# Patient Record
Sex: Male | Born: 1945 | Race: White | Hispanic: No | Marital: Married | Smoking: Former smoker
Health system: Southern US, Community
[De-identification: ages and names within clinical notes are randomized; demographics above are authoritative.]

## PROBLEM LIST (undated history)

## (undated) ENCOUNTER — Ambulatory Visit: Admission: EM | Payer: Medicare Other

## (undated) DIAGNOSIS — Z8589 Personal history of malignant neoplasm of other organs and systems: Secondary | ICD-10-CM

## (undated) DIAGNOSIS — R112 Nausea with vomiting, unspecified: Secondary | ICD-10-CM

## (undated) DIAGNOSIS — F419 Anxiety disorder, unspecified: Secondary | ICD-10-CM

## (undated) DIAGNOSIS — G4733 Obstructive sleep apnea (adult) (pediatric): Secondary | ICD-10-CM

## (undated) DIAGNOSIS — E785 Hyperlipidemia, unspecified: Secondary | ICD-10-CM

## (undated) DIAGNOSIS — I10 Essential (primary) hypertension: Secondary | ICD-10-CM

## (undated) DIAGNOSIS — I5032 Chronic diastolic (congestive) heart failure: Secondary | ICD-10-CM

## (undated) DIAGNOSIS — Z9889 Other specified postprocedural states: Secondary | ICD-10-CM

## (undated) DIAGNOSIS — Z9981 Dependence on supplemental oxygen: Secondary | ICD-10-CM

## (undated) DIAGNOSIS — M1990 Unspecified osteoarthritis, unspecified site: Secondary | ICD-10-CM

## (undated) DIAGNOSIS — A4902 Methicillin resistant Staphylococcus aureus infection, unspecified site: Secondary | ICD-10-CM

## (undated) DIAGNOSIS — I499 Cardiac arrhythmia, unspecified: Secondary | ICD-10-CM

## (undated) DIAGNOSIS — T8859XA Other complications of anesthesia, initial encounter: Secondary | ICD-10-CM

## (undated) DIAGNOSIS — A809 Acute poliomyelitis, unspecified: Secondary | ICD-10-CM

## (undated) DIAGNOSIS — N189 Chronic kidney disease, unspecified: Secondary | ICD-10-CM

## (undated) DIAGNOSIS — I5189 Other ill-defined heart diseases: Secondary | ICD-10-CM

## (undated) DIAGNOSIS — K08109 Complete loss of teeth, unspecified cause, unspecified class: Secondary | ICD-10-CM

## (undated) DIAGNOSIS — I4892 Unspecified atrial flutter: Secondary | ICD-10-CM

## (undated) DIAGNOSIS — F32A Depression, unspecified: Secondary | ICD-10-CM

## (undated) DIAGNOSIS — Z972 Presence of dental prosthetic device (complete) (partial): Secondary | ICD-10-CM

## (undated) DIAGNOSIS — D649 Anemia, unspecified: Secondary | ICD-10-CM

## (undated) DIAGNOSIS — T4145XA Adverse effect of unspecified anesthetic, initial encounter: Secondary | ICD-10-CM

## (undated) DIAGNOSIS — E119 Type 2 diabetes mellitus without complications: Secondary | ICD-10-CM

## (undated) DIAGNOSIS — Z8489 Family history of other specified conditions: Secondary | ICD-10-CM

## (undated) DIAGNOSIS — I509 Heart failure, unspecified: Secondary | ICD-10-CM

## (undated) DIAGNOSIS — H9190 Unspecified hearing loss, unspecified ear: Secondary | ICD-10-CM

## (undated) DIAGNOSIS — C84A0 Cutaneous T-cell lymphoma, unspecified, unspecified site: Secondary | ICD-10-CM

## (undated) DIAGNOSIS — N184 Chronic kidney disease, stage 4 (severe): Secondary | ICD-10-CM

## (undated) DIAGNOSIS — Z86018 Personal history of other benign neoplasm: Secondary | ICD-10-CM

## (undated) DIAGNOSIS — D631 Anemia in chronic kidney disease: Secondary | ICD-10-CM

## (undated) HISTORY — DX: Unspecified atrial flutter: I48.92

## (undated) HISTORY — PX: INCISION AND DRAINAGE: SHX5863

## (undated) HISTORY — DX: Other ill-defined heart diseases: I51.89

## (undated) HISTORY — PX: OTHER SURGICAL HISTORY: SHX169

## (undated) HISTORY — DX: Hyperlipidemia, unspecified: E78.5

## (undated) HISTORY — DX: Methicillin resistant Staphylococcus aureus infection, unspecified site: A49.02

## (undated) HISTORY — DX: Acute poliomyelitis, unspecified: A80.9

## (undated) HISTORY — DX: Obstructive sleep apnea (adult) (pediatric): G47.33

## (undated) HISTORY — PX: BACK SURGERY: SHX140

## (undated) HISTORY — DX: Type 2 diabetes mellitus without complications: E11.9

## (undated) HISTORY — PX: TONSILLECTOMY: SUR1361

## (undated) HISTORY — DX: Unspecified osteoarthritis, unspecified site: M19.90

## (undated) HISTORY — DX: Essential (primary) hypertension: I10

## (undated) HISTORY — PX: MOUTH SURGERY: SHX715

## (undated) HISTORY — DX: Cutaneous T-cell lymphoma, unspecified, unspecified site: C84.A0

## (undated) HISTORY — DX: Chronic kidney disease, stage 4 (severe): N18.4

## (undated) HISTORY — DX: Chronic diastolic (congestive) heart failure: I50.32

---
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
## 2001-01-27 ENCOUNTER — Encounter: Payer: Self-pay | Admitting: Orthopaedic Surgery

## 2001-01-27 ENCOUNTER — Ambulatory Visit (HOSPITAL_COMMUNITY): Admission: RE | Admit: 2001-01-27 | Discharge: 2001-01-27 | Payer: Self-pay | Admitting: Orthopaedic Surgery

## 2006-06-09 ENCOUNTER — Emergency Department (HOSPITAL_COMMUNITY): Admission: EM | Admit: 2006-06-09 | Discharge: 2006-06-10 | Payer: Self-pay | Admitting: Emergency Medicine

## 2008-09-10 ENCOUNTER — Ambulatory Visit: Payer: Self-pay | Admitting: Internal Medicine

## 2008-09-30 ENCOUNTER — Encounter: Payer: Self-pay | Admitting: Internal Medicine

## 2008-09-30 ENCOUNTER — Ambulatory Visit: Payer: Self-pay | Admitting: Internal Medicine

## 2008-10-01 ENCOUNTER — Encounter: Payer: Self-pay | Admitting: Internal Medicine

## 2009-12-28 ENCOUNTER — Ambulatory Visit: Payer: Self-pay

## 2010-01-12 ENCOUNTER — Ambulatory Visit: Payer: Self-pay | Admitting: Cardiovascular Disease

## 2010-01-12 DIAGNOSIS — I10 Essential (primary) hypertension: Secondary | ICD-10-CM | POA: Insufficient documentation

## 2010-01-12 DIAGNOSIS — A809 Acute poliomyelitis, unspecified: Secondary | ICD-10-CM

## 2010-01-12 HISTORY — DX: Acute poliomyelitis, unspecified: A80.9

## 2010-01-16 ENCOUNTER — Ambulatory Visit: Payer: Self-pay | Admitting: Cardiovascular Disease

## 2010-02-02 ENCOUNTER — Encounter: Admission: RE | Admit: 2010-02-02 | Discharge: 2010-03-26 | Payer: Self-pay | Admitting: Neurology

## 2010-09-08 NOTE — Assessment & Plan Note (Signed)
Summary: NP6/AMD   Visit Type:  new pt visit Primary Provider:  Golden Pop  CC:  no complaints.  History of Present Illness: 65 year old gentleman no known cardiac disease, who exercises on a frequent basis who presents for evaluation prior to biking across the country. He denies any significant shortness of breath or chest discomfort. He reports that his blood pressure has been elevated moderately recently with systolics sometimes above 140s in to the 150 range. He runs and bikes frequently, hikes as well. He is a nonsmoker.  Prior to doing any significant exertion such as biking across country, he like to have a stress test.  Allergies (verified): No Known Drug Allergies  Past History:  Past Surgical History: Surgical History: Right shoulder surgery /right elbow surgery/Right knee surgery spleenectomy  Family History: Family History of Cancer: Father pancreatic  Social History: Tobacco Use - No.  Alcohol Use - no Drug Use - no Retired --Actor Married   Review of Systems  The patient denies fever, weight loss, weight gain, vision loss, decreased hearing, hoarseness, chest pain, syncope, dyspnea on exertion, peripheral edema, prolonged cough, abdominal pain, incontinence, muscle weakness, depression, and enlarged lymph nodes.    Vital Signs:  Patient profile:   65 year old male Height:      71 inches Weight:      217 pounds BMI:     30.37 Pulse rate:   93 / minute BP sitting:   157 / 91  (left arm) Cuff size:   regular  Vitals Entered By: Dolores Lory, CMA (January 12, 2010 10:37 AM)  Physical Exam  General:  Well developed, well nourished, in no acute distress. Head:  normocephalic and atraumatic Neck:  Neck supple, no JVD. No masses, thyromegaly or abnormal cervical nodes. Chest Wall:  no deformities or breast masses noted Lungs:  Clear bilaterally to auscultation and percussion. Heart:  Non-displaced PMI, chest non-tender; regular rate  and rhythm, S1, S2 without murmurs, rubs or gallops. Carotid upstroke normal, no bruit.  Pedals normal pulses. No edema, no varicosities. Abdomen:  Bowel sounds positive; abdomen soft and non-tender without masses, organomegaly, or hernias noted. No hepatosplenomegaly. Msk:  Back normal, normal gait. Muscle strength and tone normal. Pulses:  pulses normal in all 4 extremities Extremities:  No clubbing or cyanosis. Neurologic:  Alert and oriented x 3. Skin:  Intact without lesions or rashes. Psych:  Normal affect.   New Orders:     1)  Treadmill (Treadmill)   EKG  Procedure date:  01/12/2010  Findings:      normal sinus rhythm with rate 93 beats per minute, no significant ST or T wave changes.  Impression & Recommendations:  Problem # 1:  HYPERTENSION (ICD-401.9) blood pressure is borderline elevated today. We have asked him to monitor his pressures at home as he has a new blood pressure cuff.  His updated medication list for this problem includes:    Aspirin 81 Mg Tbec (Aspirin) .Marland Kitchen... Take one tablet by mouth daily    Tekturna 150 Mg Tabs (Aliskiren fumarate) .Marland Kitchen... Take one tablet by mouth daily    Azor 10-40 Mg Tabs (Amlodipine-olmesartan) ..... Once daily  Orders: Treadmill (Treadmill)  Problem # 2:  PREVENTIVE HEALTH CARE (ICD-V70.0) He reports a cholesterol of 181. He is a nonsmoker, no diabetes, some family history of coronary artery disease. He would like to treat this medically.  His heart rate is mildly elevated and have asked him to monitor his heart rate at  baseline and with exercise.  he would like a treadmill stress test to ensure that he be able to handle heavy exertion such as biking across country. Given his history of hypertension, baseline elevated heart rate, we will order a plain treadmill test at [facility].  Patient Instructions: 1)  Your physician recommends that you continue on your current medications as directed. Please refer to  the Current Medication list given to you today. 2)  Your physician has requested that you have an exercise tolerance test.  For further information please visit [email or web address].  Please also follow instruction sheet, as given.

## 2010-09-08 NOTE — Letter (Signed)
Summary: health hx  health hx   Imported By: Cathie Beams Chriscoe 01/12/2010 16:02:29  _____________________________________________________________________  External Attachment:    Type:   Image     Comment:   External Document

## 2010-09-08 NOTE — Letter (Signed)
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 [place], [place] [address]   Phone: [phone]  Fax: [phone]      [facility] ETT  Patient Name:  Daniel Kline          MRN:  [id number]  DOB:  [birth year]             Weight:  217 Home Telephone:  [phone]           Work Phone:  [phone] Referring Physician: Sima Matas at 7:30 am on Friday January 16, 2010   Instructions regarding medication: OK to take all medications with a sip of water    PLEASE NOTIFY THE OFFICE AT LEAST 24 HOURS IN ADVANCE IF YOU ARE UNABLE TO [place].  [phone]   How to prepare for your Stress test:  1)   May eat a lite breakfast.  2)   No caffeine or decaffeinated 12 hours prior to arrival. 3)   Your medication may be taken with water.  If your doctor stopped a        medicaiton because of this test, do not take that medication. 4)   Ladies, please do not wear dresses.  Skirts or pants are appropriate.       Please wear a short sleeve shirt. 5)   No perfume, cologne or lotion. 6)   Wear comfortable walking shoes.  No heels!    PLEASE REPORT TO [facility] MEDICAL MALL ENTRANCE. THE VOLUNTEERS AT THE FIRST DESK WILL DIRECT YOU WHERE TO GO.

## 2011-02-15 ENCOUNTER — Ambulatory Visit: Payer: Self-pay | Admitting: Unknown Physician Specialty

## 2011-02-23 ENCOUNTER — Inpatient Hospital Stay: Payer: Self-pay | Admitting: Unknown Physician Specialty

## 2011-02-23 ENCOUNTER — Encounter: Payer: Self-pay | Admitting: Cardiovascular Disease

## 2011-03-24 ENCOUNTER — Other Ambulatory Visit: Payer: Self-pay | Admitting: Unknown Physician Specialty

## 2011-04-09 ENCOUNTER — Inpatient Hospital Stay: Payer: Self-pay | Admitting: Unknown Physician Specialty

## 2011-05-17 ENCOUNTER — Other Ambulatory Visit: Payer: Self-pay | Admitting: Internal Medicine

## 2011-06-13 ENCOUNTER — Inpatient Hospital Stay: Payer: Self-pay | Admitting: Internal Medicine

## 2011-09-21 IMAGING — CR DG CHEST 2V
1 series · 2 of 2 positions shown · non-contrast
Comparison: none

REASON FOR EXAM: cough, fever
COMMENTS:

[Series 1: view not recorded · 0.17mm/px · 2 of 2 slices shown]
[im 1/2]
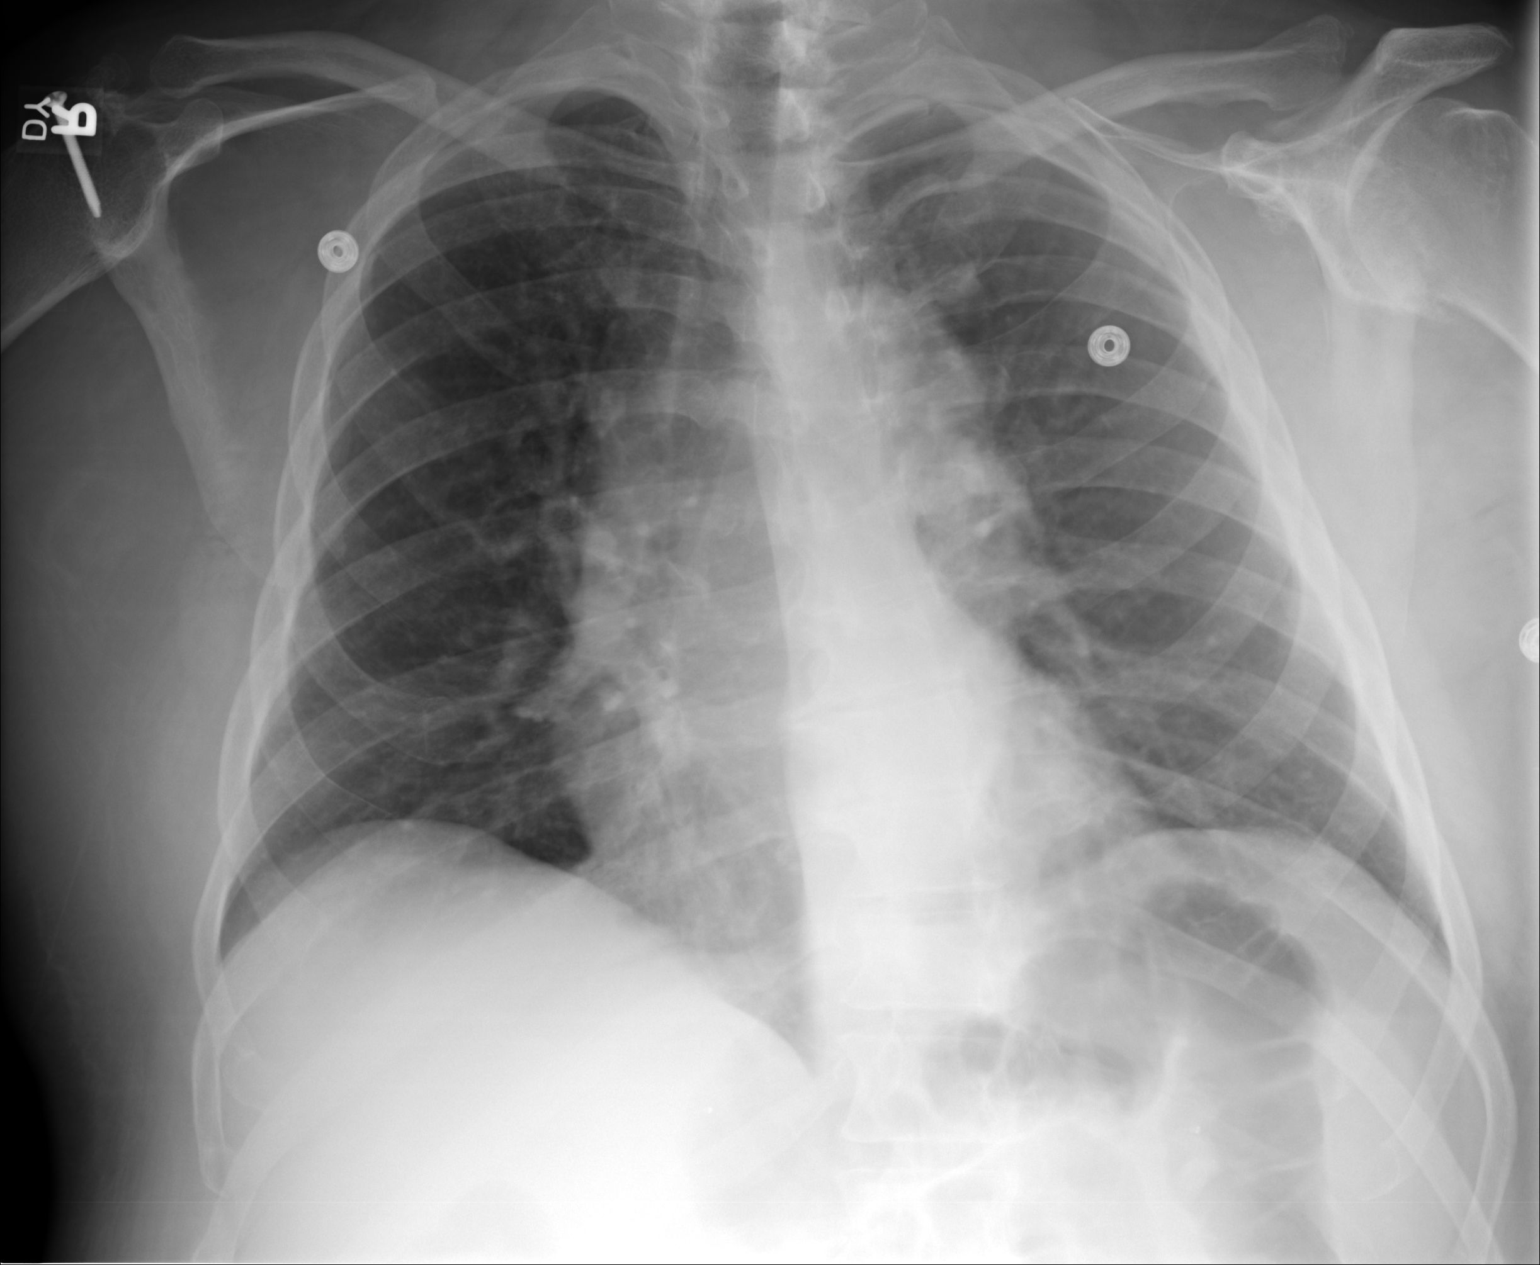
[im 2/2]
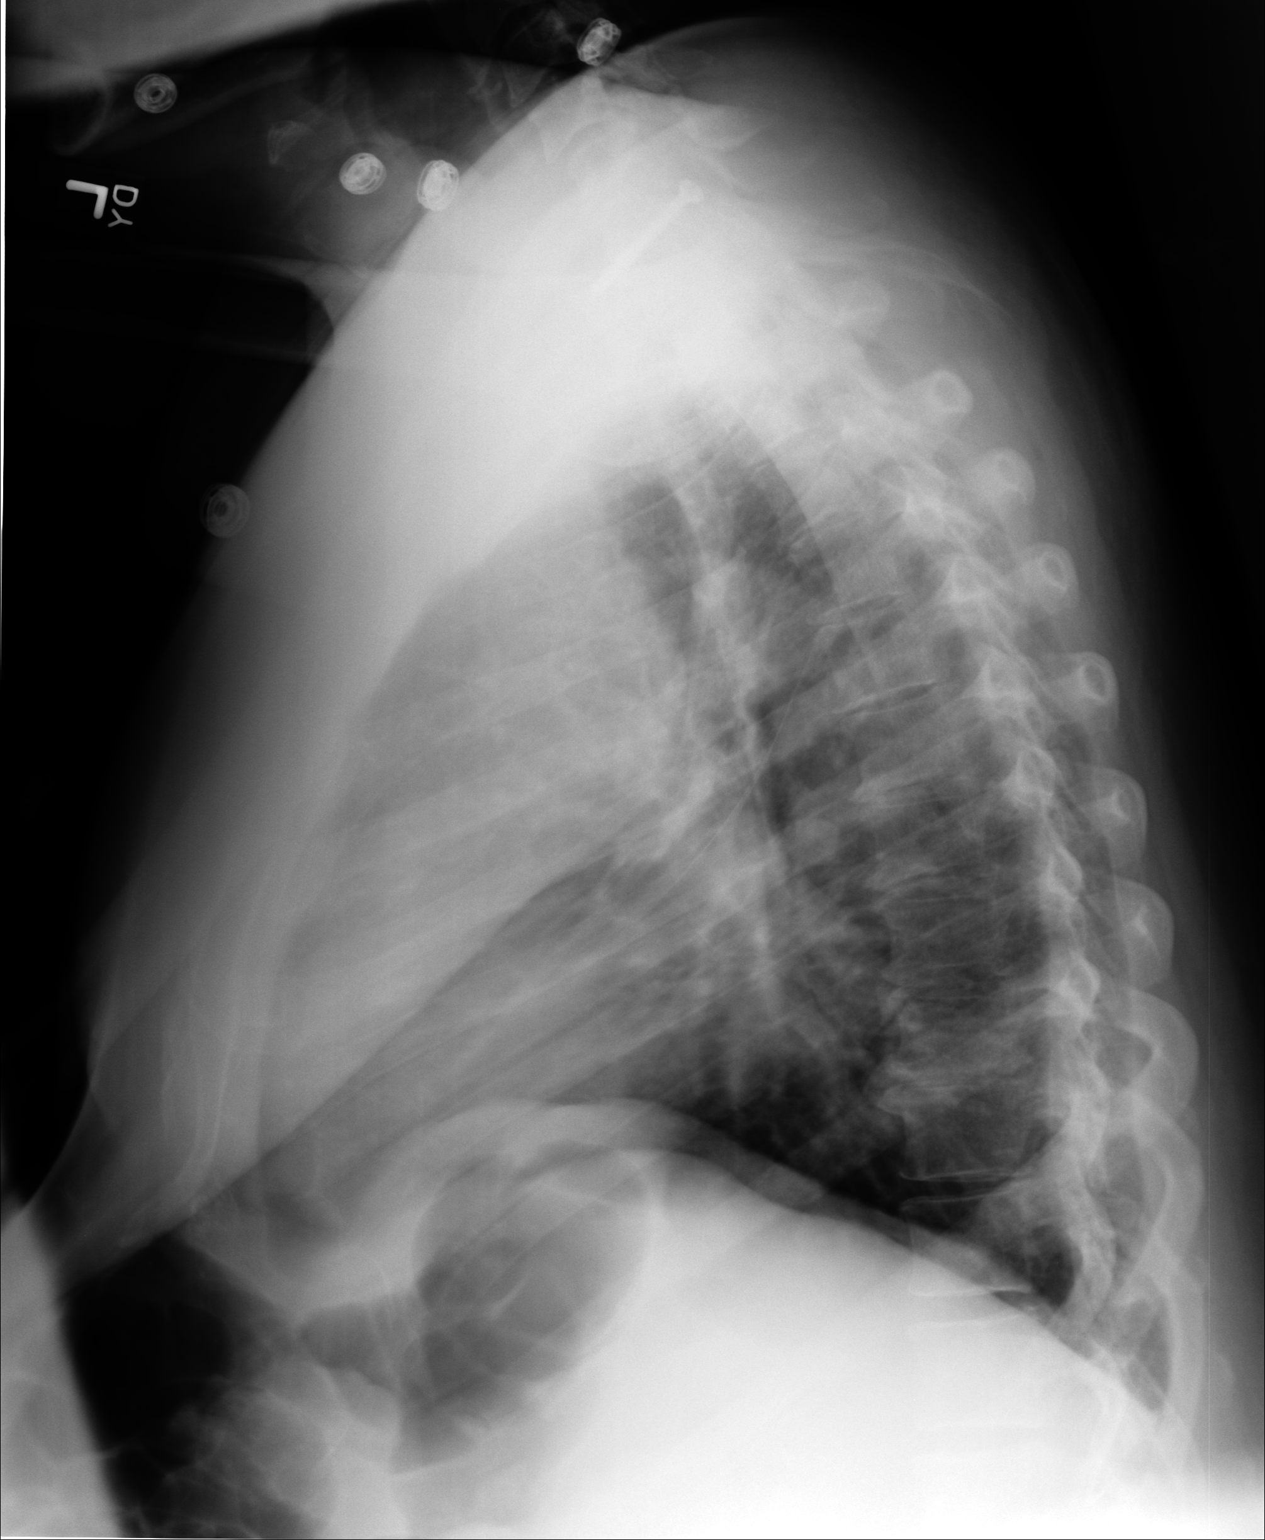

[2 of 2 positions shown; findings below may reference images not displayed]

PROCEDURE:     DXR - DXR CHEST PA (OR AP) AND LATERAL  - April 10, 2011  [DATE]

RESULT:     Comparison is made to the previous exam of 04/09/2011.

The patient is rotated slightly toward the left. The heart is within normal
limits for size. Prominent costochondral calcification is present.
Degenerative bony spurring is present. The patient could not raise one arm
well for the lateral exam. There is no edema, effusion or infiltrate.
IMPRESSION: No acute cardiopulmonary disease evident.

## 2011-09-23 IMAGING — CR DG ABDOMEN 1V
1 series · 2 of 2 positions shown · non-contrast
Comparison: none

REASON FOR EXAM: vomitting and abdominal distention
COMMENTS:

PROCEDURE:     DXR - DXR KIDNEY URETER BLADDER  - April 12, 2011  [DATE]
RESULT:     Comparisons:  04/09/2011

[Series 1: view not recorded · 0.17mm/px · 2 of 2 slices shown]
[im 1/2]
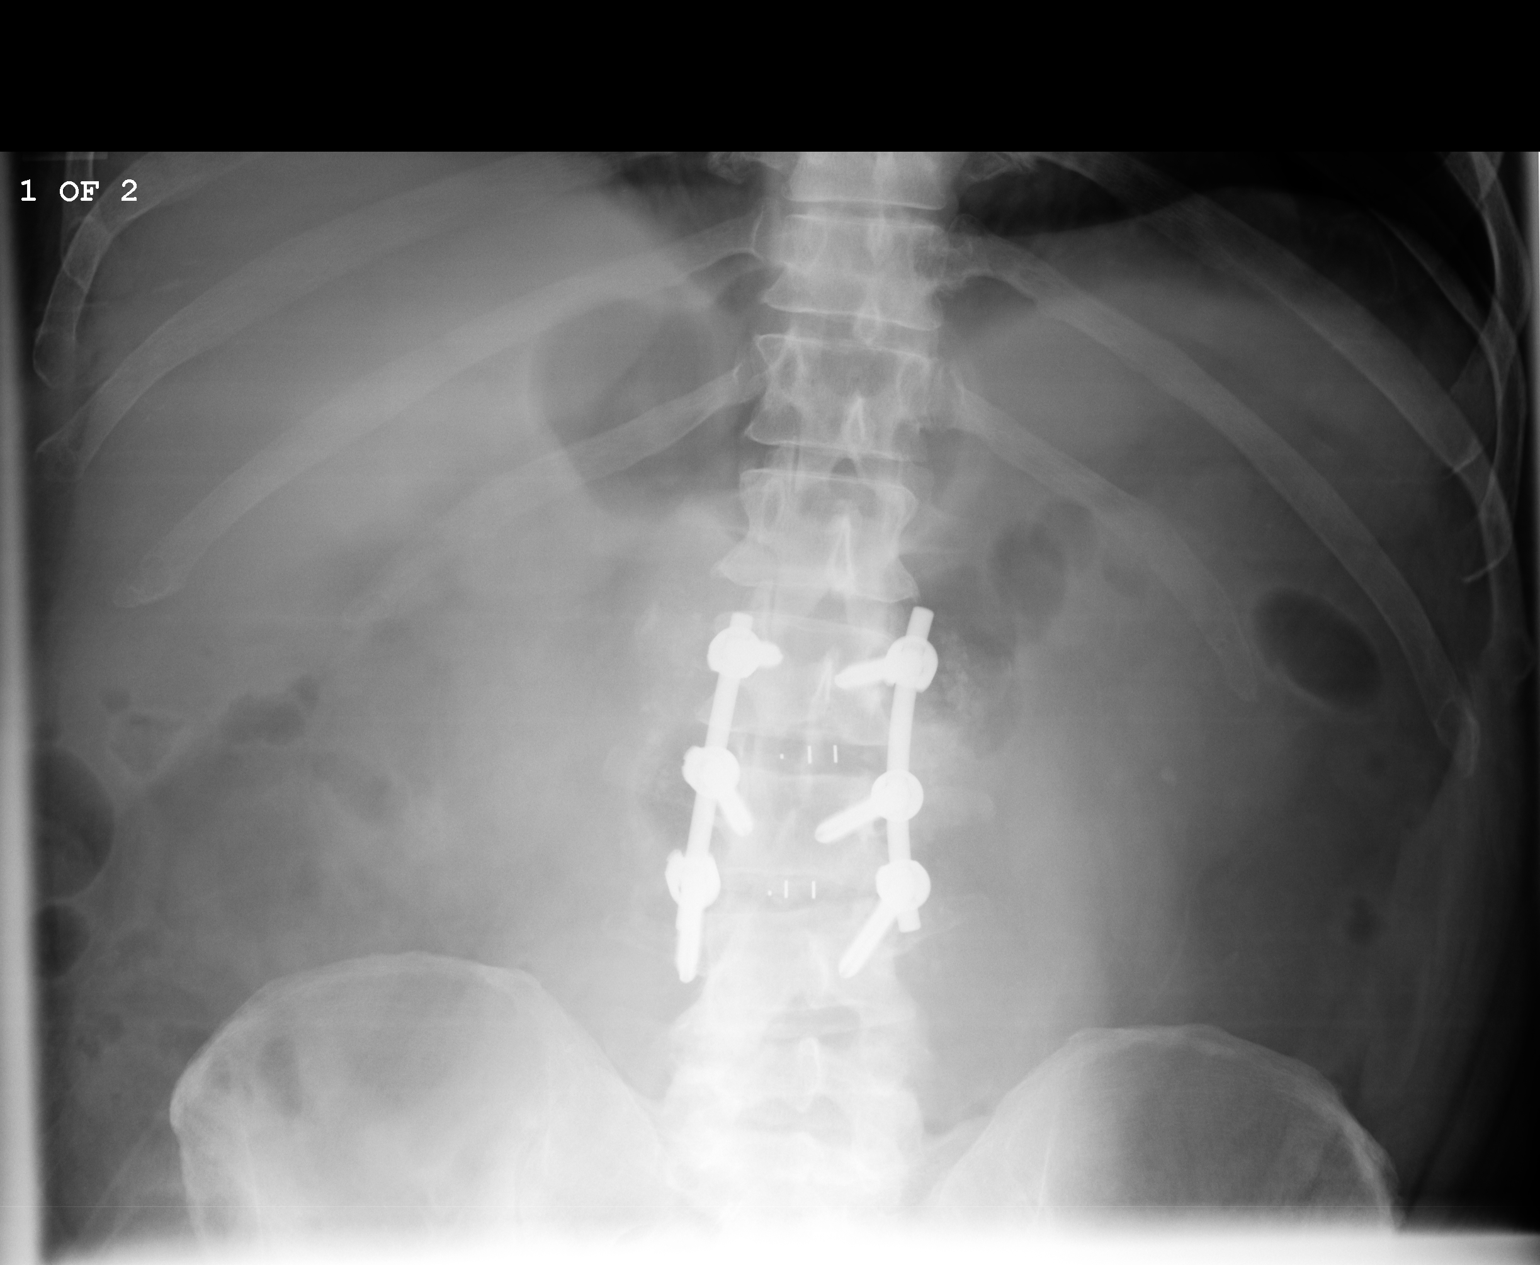
[im 2/2]
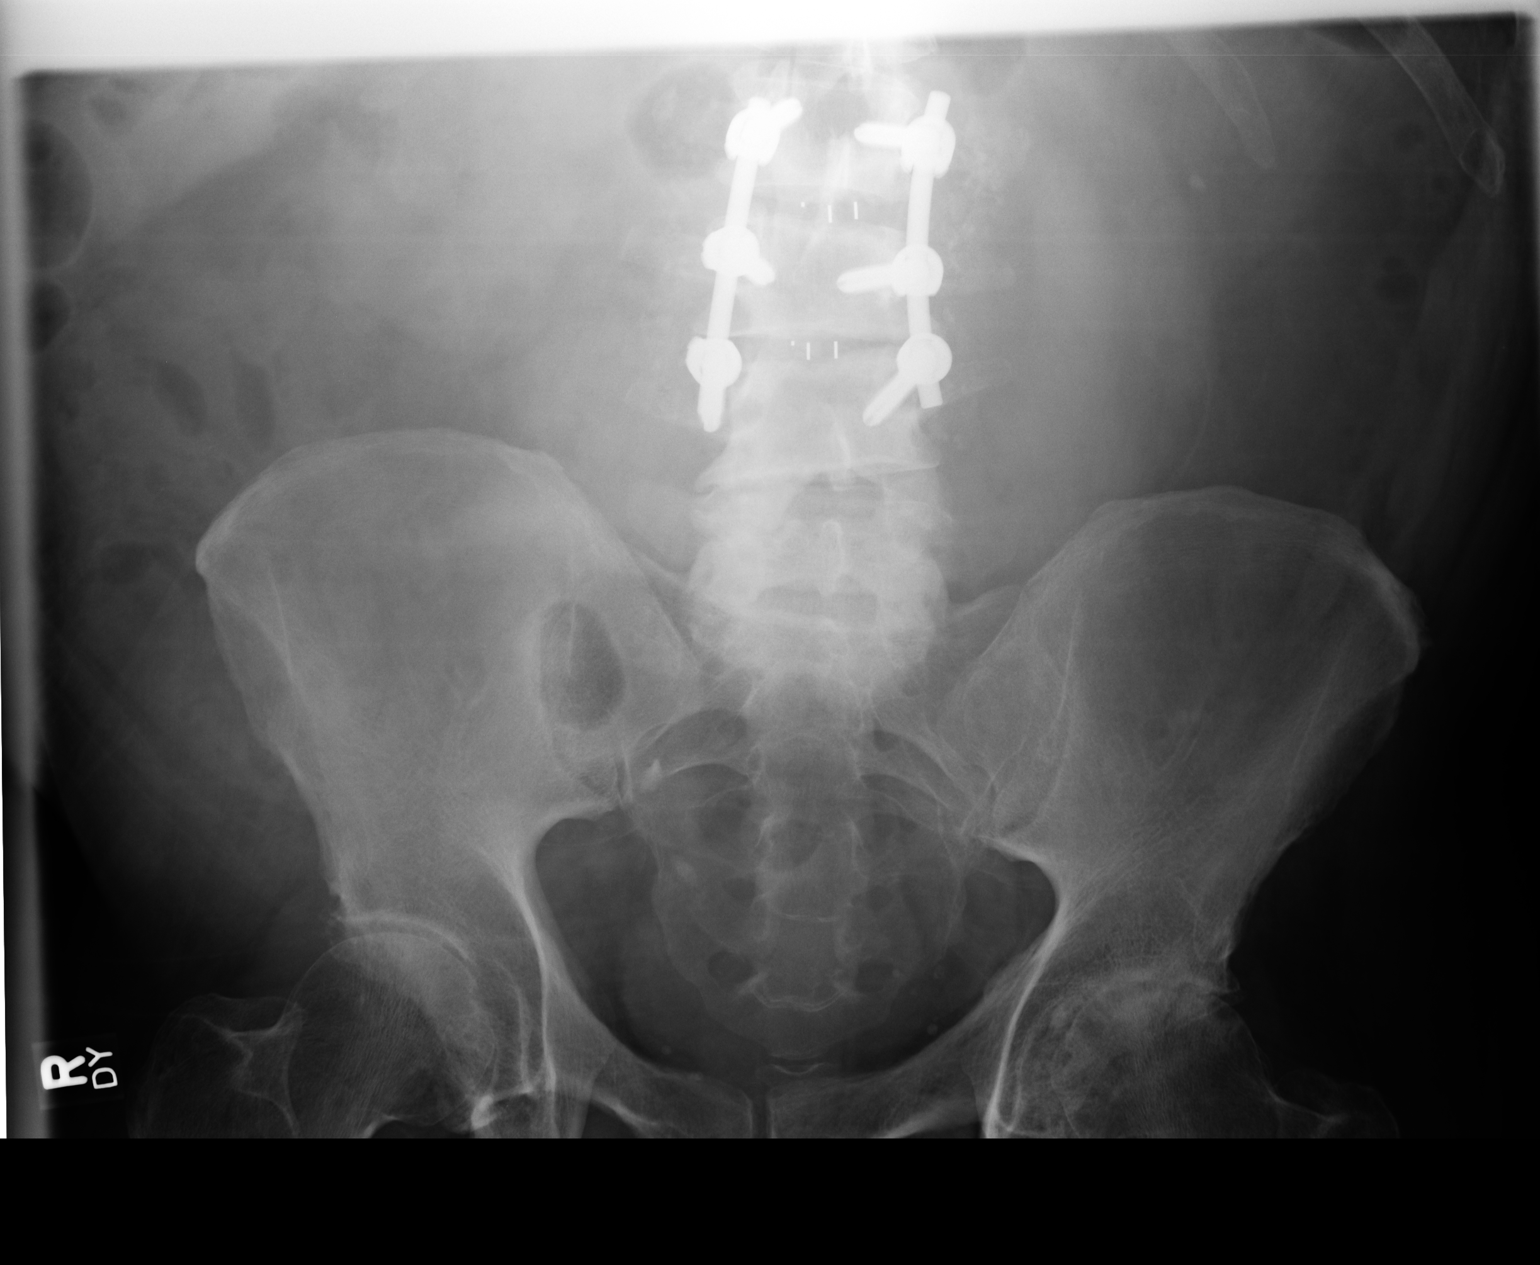

[2 of 2 positions shown; findings below may reference images not displayed]

FINDINGS: Supine radiograph of the abdomen is provided.

There is a nonspecific bowel gas pattern. There is no bowel dilatation to
suggest obstruction. There is no pathologic calcification along the expected
course of the ureters. There is no evidence of pneumoperitoneum, portal
venous gas, or pneumatosis.

There is posterior spinal fusion from L2 through L4. The right L4 pedicle
screw a appears slightly more lateral to the pedicle compared with the other
screws which may be artifactual.
IMPRESSION: Unremarkable KUB.

 The right L4 pedicle screw a appears slightly more lateral to the pedicle
compared with the other screws which may be artifactual. If there is further
clinical concern recommend a CT of the lumbar spine for further evaluation.

## 2011-10-18 ENCOUNTER — Ambulatory Visit: Payer: Self-pay | Admitting: Unknown Physician Specialty

## 2011-10-18 LAB — URINALYSIS, COMPLETE
Bacteria: NONE SEEN
Blood: NEGATIVE
Glucose,UR: NEGATIVE mg/dL (ref 0–75)
Nitrite: NEGATIVE
Ph: 5 (ref 4.5–8.0)
Protein: NEGATIVE
Specific Gravity: 1.021 (ref 1.003–1.030)

## 2011-10-18 LAB — BASIC METABOLIC PANEL
Anion Gap: 6 — ABNORMAL LOW (ref 7–16)
BUN: 27 mg/dL — ABNORMAL HIGH (ref 7–18)
Chloride: 105 mmol/L (ref 98–107)
Creatinine: 1.59 mg/dL — ABNORMAL HIGH (ref 0.60–1.30)
EGFR (Non-African Amer.): 46 — ABNORMAL LOW

## 2011-10-18 LAB — CBC
MCH: 28.9 pg (ref 26.0–34.0)
MCHC: 33.1 g/dL (ref 32.0–36.0)
MCV: 87 fL (ref 80–100)
Platelet: 221 10*3/uL (ref 150–440)

## 2011-10-18 LAB — MRSA PCR SCREENING

## 2011-10-19 ENCOUNTER — Ambulatory Visit: Payer: Self-pay | Admitting: Anesthesiology

## 2011-10-26 ENCOUNTER — Inpatient Hospital Stay: Payer: Self-pay | Admitting: Unknown Physician Specialty

## 2011-10-27 LAB — PLATELET COUNT: Platelet: 157 10*3/uL (ref 150–440)

## 2011-10-27 LAB — BASIC METABOLIC PANEL
BUN: 22 mg/dL — ABNORMAL HIGH (ref 7–18)
Chloride: 103 mmol/L (ref 98–107)
Co2: 23 mmol/L (ref 21–32)
EGFR (Non-African Amer.): 48 — ABNORMAL LOW
Glucose: 142 mg/dL — ABNORMAL HIGH (ref 65–99)
Osmolality: 281 (ref 275–301)
Potassium: 4.4 mmol/L (ref 3.5–5.1)

## 2011-10-27 LAB — HEMOGLOBIN: HGB: 8.3 g/dL — ABNORMAL LOW (ref 13.0–18.0)

## 2011-10-27 LAB — PATHOLOGY REPORT

## 2011-10-28 LAB — BASIC METABOLIC PANEL
Anion Gap: 10 (ref 7–16)
Calcium, Total: 7.9 mg/dL — ABNORMAL LOW (ref 8.5–10.1)
Co2: 25 mmol/L (ref 21–32)
EGFR (Non-African Amer.): 49 — ABNORMAL LOW
Potassium: 3.9 mmol/L (ref 3.5–5.1)
Sodium: 141 mmol/L (ref 136–145)

## 2011-10-29 LAB — HEMOGLOBIN
HGB: 7.8 g/dL — ABNORMAL LOW (ref 13.0–18.0)
HGB: 7.9 g/dL — ABNORMAL LOW (ref 13.0–18.0)

## 2011-10-30 LAB — HEMOGLOBIN: HGB: 8.5 g/dL — ABNORMAL LOW (ref 13.0–18.0)

## 2011-12-20 DIAGNOSIS — T8579XA Infection and inflammatory reaction due to other internal prosthetic devices, implants and grafts, initial encounter: Secondary | ICD-10-CM | POA: Insufficient documentation

## 2011-12-20 DIAGNOSIS — A4902 Methicillin resistant Staphylococcus aureus infection, unspecified site: Secondary | ICD-10-CM | POA: Insufficient documentation

## 2011-12-20 DIAGNOSIS — C84A0 Cutaneous T-cell lymphoma, unspecified, unspecified site: Secondary | ICD-10-CM | POA: Insufficient documentation

## 2011-12-20 DIAGNOSIS — E7800 Pure hypercholesterolemia, unspecified: Secondary | ICD-10-CM | POA: Insufficient documentation

## 2013-02-08 ENCOUNTER — Ambulatory Visit: Payer: Self-pay

## 2013-03-09 ENCOUNTER — Ambulatory Visit: Payer: Self-pay

## 2013-04-09 ENCOUNTER — Ambulatory Visit: Payer: Self-pay

## 2014-12-01 NOTE — Op Note (Signed)
PATIENT NAME:  Daniel Kline, Daniel Kline MR#:  [id number] DATE OF BIRTH:  26-Nov-1945  DATE OF PROCEDURE:  10/26/2011  PREOPERATIVE DIAGNOSIS: Degenerative arthritis, left hip.   POSTOPERATIVE DIAGNOSIS: Degenerative arthritis, left hip.   PROCEDURE: Left total hip replacement.   SURGEON: Alysia Penna. Mauri Pole, MD   ASSISTANT: Rachelle Hora, PA-C   ANESTHESIA: General.   ESTIMATED BLOOD LOSS: 800 mL.  REPLACEMENT: 1500 mL of crystalloid.   DRAINS: One Autovac.   COMPLICATIONS: None.   IMPLANTS USED: Stryker Secur-Fit Max 132 degrees, #8 femoral component, 36 mm +5 Biolox head, 56 mm acetabular shell, 10-degree liner, one 6.5 mm screw.   BRIEF CLINICAL NOTE AND PATHOLOGY: The patient had progressive documented severe degenerative arthritis of the left hip. There was some shortening by about 1 cm. The options, risks, and benefits were discussed. At time of the procedure, there was severe degenerative arthritis. The patient's bone was fairly soft. Approximately 1 cm was gained in length according to intraoperative measurements.   DESCRIPTION OF PROCEDURE: Preop antibiotics, adequate general anesthesia, right lateral view position, hip grips were used. Routine prepping and draping. Appropriate timeout was called.   The routine posterior approach was made. The fascia was opened in line with the incision, sciatic nerve identified and a self-retaining retractor was placed. The piriformis was tagged and reflected. Two Steinmann pins were placed in the pelvis paralleling in line with the anterior/superior iliac spine and sciatic notch. A drill bit was placed in the greater trochanter. Leg length was measured. Posterior capsule was then removed, and the hip was dislocated.   The proximal femur was delivered. The neck cut was made using the guide. The canal finder was used followed by the [place]. Tapered reaming then progressed up to size 8. This was done with power. Broaching was then performed, calcar  planing performed. The prosthesis filled the canal well.   Attention was then turned to the acetabulum. It was exposed, surrounding soft tissue was removed. Reaming was progressed up to 56 mm using the patient's anatomy and previously drawn line. This produced good bleeding bone, was thoroughly irrigated. The trial fit nicely. The actual acetabular shell was then placed, and it was extremely secure. One superior screw was placed for additional support.   The trial liner was placed. The hip was reduced and was extremely stable. The actual 10-degree liner was then placed.   Attention was turned back to the femoral canal where the broach was removed. The canal was thoroughly irrigated. The actual component was then inserted. It seated nicely. The +5 head trial was placed, the hip reduced, and it was extremely stable. The actual head was then placed. The hip was reduced, was stable in extension and external rotation, flexion, adduction, internal rotation to about 45 degrees.   Leg length was measured to have been increased 1 cm. The incision was thoroughly irrigated. Piriformis was reattached with #5 nonabsorbable suture. The sciatic nerve was identified and was without any obvious compression. The fascia was closed with #2 quill, the subcutaneous was closed with zero quill, the skin with staples. A Provena wound dressing was applied due to the patient's skin issues. The Autovac drain had been placed. Sponge and needle counts were reported as correct prior to and after wound closure. The patient was awakened and taken to the PAC-U having tolerated the procedure well.  ____________________________ Alysia Penna. Mauri Pole, MD jcc:cbb D: 10/27/2011 06:33:48 ET T: 10/27/2011 11:28:17 ET JOB#: [id number] Alysia Penna Dayle Mcnerney MD ELECTRONICALLY SIGNED 10/29/2011 6:07

## 2014-12-01 NOTE — Discharge Summary (Signed)
PATIENT NAME:  Daniel Kline, Daniel Kline MR#:  [id number] DATE OF BIRTH:  09/24/1945  DATE OF ADMISSION:  10/26/2011 DATE OF DISCHARGE:  10/30/2011  ADMISSION DIAGNOSIS: Degenerative arthritis, left hip.  DISCHARGE DIAGNOSIS: Degenerative arthritis, left hip.   PROCEDURE: Left hip total replacement. Surgeon: Alysia Penna. Mauri Pole, MD.  Assistant: Rachelle Hora, PA-C.  Anesthesia: General. Estimated blood loss 800 mL. Replacement: 1500 mL of crystalloid. Drains: One Autovac.  Complications: None. Implants used: Stryker Secur-Fit Max 132 degrees #8 femoral component, 36 mm +5 Biolox head, 56 mm acetabular shell, 10-degree liner, one 6.5 mm screw.   BRIEF CLINICAL NOTE AND PATHOLOGY: The patient had progressive documented severe degenerative arthritis of the left hip.  There was some shortening by about 1 cm.  The options, risks and benefits were discussed at the time of the procedure, there was severe degenerative arthritis. The patient's body was fairly soft. Approximately 1 cm was gained in length according to intraoperative measurements.   PHYSICAL EXAMINATION: GENERAL: A well-developed, well-nourished male in no apparent distress. PSYCHIATRIC: Normal affect. GENERAL: The patient was sitting in a wheelchair, marked difficulty with standing. HEENT: Pupils were equal, round and reactive to light and accommodation. Extraocular movements were intact.  NECK: Supple, full range of motion. CARDIAC: Normal regular rate and rhythm.  CHEST: Atraumatic, clear, no rales or rhonchi, no respiratory distress. ABDOMEN: Bowel sounds were positive, no masses noted. SKIN: Unremarkable. MUSCULOSKELETAL: Cervical and thoracic spine unremarkable. Lumbosacral spine: Tender at the lumbosacral junction. Pain primarily with extension. Decreased lateral rotation and bending. Right lower extremity, right hip, knee, foot and ankle have good range of motion with no pain. Left lower extremity with marked decreased range of motion with his left hip  with significant pain with any internal and external rotation.  Knee, foot and ankle have overall good range of motion.  Neurovascular examination of both lower extremities is intact with some mild diffuse weakness.  Bilateral upper extremities with good range of motion with mild diffuse weakness.   HOSPITAL COURSE: After initial admission on 10/26/2011, the patient had surgery that same day.  The patient had good pain control afterwards and was brought to the Orthopedic floor from the PACU.  The patient initially had a hemoglobin on postoperative day one of 8.3, and then on postoperative day two his hemoglobin dropped to 7.7.  The patient was given 2 units of blood, and then on postoperative day four his hemoglobin returned to 8.5.  On 10/30/2011, the patient was stable and ready to go home.   CONDITION ON DISCHARGE: Stable.   DISPOSITION: The patient was sent to rehabilitation.   DISCHARGE INSTRUCTIONS:  1. The patient will follow up with [facility] in 7 to 10 days for dressing change and staple removal.  2. The patient will do physical therapy with weightbearing as tolerated on the left using a walker.  3. The patient's diet is a carbohydrate-controlled diet.  4. TED hose will be utilized knee-high bilaterally. 5. An abduction pillow will be used when in bed and in the chair. A regular pillow can be utilized both times.  6. Activity will be weightbearing as tolerated.  7. Dressing will be changed on a p.r.n.  basis.  DISCHARGE MEDICATIONS:  1. Tylenol 500 to 1000 mg oral every 4 hours as needed for pain or temperature greater than 100.4. 2. Celebrex 200 mg oral b.i.d. as needed for pain.  3. Docusate Senna 50/8.6 mg tablet, 1 tablet oral b.i.d. for constipation.  4. Magnesium hydroxide suspension  30 mL oral b.i.d. as needed for constipation.  5. Dulcolax suppository 10 mg rectally daily as needed for constipation. 6. Soap suds enema as needed.  7. Lovenox injection 30 mg  subcutaneous every 12 hours. 8. Aluminum mag hydroxide with simethicone 400/400, 40 mg/5 mL suspension, 30 mL orally every 6 hours as needed for indigestion and heartburn. 9. Amlodipine tablet 5 mg oral once a day.  10. Clonazepam 7.5 mg oral t.i.d. as needed for anxiety. 11. Doxycycline 100 mg oral every 12 hours.  12. Hydroxyzine tablet 25 mg oral q.i.d. as needed for itching. 13. Furosemide tablet 20 mg orally once a day. 14. Bimatoprost (Lumigan) 0.01% ophthalmic solution 1 drop both eyes at bedtime. 15. Metoprolol 50 mg orally every 12 hours. 16. Gabapentin capsule 300 mg oral t.i.d.   17. Acetaminophen/hydrocodone 325/7.5 mg tablet, 1 to 2 tablets orally every 4 to 6 hours as needed for pain. 18. Menthol Cepacol Lozenges, 1 to 2 lozenges oral every 4 hours as needed for cough or sore throat.   ____________________________ Duanne Guess, PA-C tcg:cbb D: 10/30/2011 12:51:20 ET T: 10/30/2011 12:56:36 ET JOB#: [id number]  cc: Duanne Guess, PA-C, <Dictator> Duanne Guess [place] ELECTRONICALLY SIGNED 11/01/2011 11:47

## 2015-06-24 ENCOUNTER — Encounter: Payer: Self-pay | Admitting: Internal Medicine

## 2015-08-26 DIAGNOSIS — M1611 Unilateral primary osteoarthritis, right hip: Secondary | ICD-10-CM | POA: Diagnosis not present

## 2015-08-26 DIAGNOSIS — M25551 Pain in right hip: Secondary | ICD-10-CM | POA: Diagnosis not present

## 2015-08-26 DIAGNOSIS — Z888 Allergy status to other drugs, medicaments and biological substances status: Secondary | ICD-10-CM | POA: Diagnosis not present

## 2015-08-26 DIAGNOSIS — M25461 Effusion, right knee: Secondary | ICD-10-CM | POA: Diagnosis not present

## 2015-08-26 DIAGNOSIS — M1711 Unilateral primary osteoarthritis, right knee: Secondary | ICD-10-CM | POA: Insufficient documentation

## 2015-08-26 DIAGNOSIS — M25561 Pain in right knee: Secondary | ICD-10-CM | POA: Diagnosis not present

## 2015-08-26 DIAGNOSIS — G8929 Other chronic pain: Secondary | ICD-10-CM | POA: Diagnosis not present

## 2015-08-26 DIAGNOSIS — Z7984 Long term (current) use of oral hypoglycemic drugs: Secondary | ICD-10-CM | POA: Diagnosis not present

## 2015-08-26 DIAGNOSIS — Z885 Allergy status to narcotic agent status: Secondary | ICD-10-CM | POA: Diagnosis not present

## 2015-08-26 DIAGNOSIS — Z96642 Presence of left artificial hip joint: Secondary | ICD-10-CM | POA: Diagnosis not present

## 2015-08-26 DIAGNOSIS — M169 Osteoarthritis of hip, unspecified: Secondary | ICD-10-CM | POA: Insufficient documentation

## 2015-08-26 DIAGNOSIS — E119 Type 2 diabetes mellitus without complications: Secondary | ICD-10-CM | POA: Diagnosis not present

## 2015-08-26 DIAGNOSIS — M1710 Unilateral primary osteoarthritis, unspecified knee: Secondary | ICD-10-CM | POA: Insufficient documentation

## 2015-09-01 DIAGNOSIS — M25551 Pain in right hip: Secondary | ICD-10-CM | POA: Diagnosis not present

## 2015-09-01 DIAGNOSIS — Z79899 Other long term (current) drug therapy: Secondary | ICD-10-CM | POA: Diagnosis not present

## 2015-09-01 DIAGNOSIS — M25561 Pain in right knee: Secondary | ICD-10-CM | POA: Diagnosis not present

## 2015-09-01 DIAGNOSIS — M1711 Unilateral primary osteoarthritis, right knee: Secondary | ICD-10-CM | POA: Diagnosis not present

## 2015-09-01 DIAGNOSIS — G8929 Other chronic pain: Secondary | ICD-10-CM | POA: Diagnosis not present

## 2015-09-01 DIAGNOSIS — C84A0 Cutaneous T-cell lymphoma, unspecified, unspecified site: Secondary | ICD-10-CM | POA: Diagnosis not present

## 2015-09-01 DIAGNOSIS — M1611 Unilateral primary osteoarthritis, right hip: Secondary | ICD-10-CM | POA: Diagnosis not present

## 2015-09-08 DIAGNOSIS — M1611 Unilateral primary osteoarthritis, right hip: Secondary | ICD-10-CM | POA: Diagnosis not present

## 2015-09-09 DIAGNOSIS — M25561 Pain in right knee: Secondary | ICD-10-CM | POA: Diagnosis not present

## 2015-09-09 DIAGNOSIS — M1611 Unilateral primary osteoarthritis, right hip: Secondary | ICD-10-CM | POA: Diagnosis not present

## 2015-09-09 DIAGNOSIS — M25551 Pain in right hip: Secondary | ICD-10-CM | POA: Diagnosis not present

## 2015-09-09 DIAGNOSIS — M1711 Unilateral primary osteoarthritis, right knee: Secondary | ICD-10-CM | POA: Diagnosis not present

## 2015-09-09 DIAGNOSIS — G8929 Other chronic pain: Secondary | ICD-10-CM | POA: Diagnosis not present

## 2015-09-15 DIAGNOSIS — M1711 Unilateral primary osteoarthritis, right knee: Secondary | ICD-10-CM | POA: Diagnosis not present

## 2015-09-15 DIAGNOSIS — G8929 Other chronic pain: Secondary | ICD-10-CM | POA: Diagnosis not present

## 2015-09-15 DIAGNOSIS — M25561 Pain in right knee: Secondary | ICD-10-CM | POA: Diagnosis not present

## 2015-09-15 DIAGNOSIS — M1611 Unilateral primary osteoarthritis, right hip: Secondary | ICD-10-CM | POA: Diagnosis not present

## 2015-09-15 DIAGNOSIS — M25551 Pain in right hip: Secondary | ICD-10-CM | POA: Diagnosis not present

## 2015-09-19 ENCOUNTER — Encounter: Payer: Self-pay | Admitting: Gastroenterology

## 2015-09-22 DIAGNOSIS — M25551 Pain in right hip: Secondary | ICD-10-CM | POA: Diagnosis not present

## 2015-09-22 DIAGNOSIS — M1611 Unilateral primary osteoarthritis, right hip: Secondary | ICD-10-CM | POA: Diagnosis not present

## 2015-09-22 DIAGNOSIS — G8929 Other chronic pain: Secondary | ICD-10-CM | POA: Diagnosis not present

## 2015-09-22 DIAGNOSIS — M1711 Unilateral primary osteoarthritis, right knee: Secondary | ICD-10-CM | POA: Diagnosis not present

## 2015-09-22 DIAGNOSIS — M25561 Pain in right knee: Secondary | ICD-10-CM | POA: Diagnosis not present

## 2015-09-24 ENCOUNTER — Ambulatory Visit (INDEPENDENT_AMBULATORY_CARE_PROVIDER_SITE_OTHER): Payer: PPO | Admitting: Internal Medicine

## 2015-09-24 ENCOUNTER — Encounter: Payer: Self-pay | Admitting: Internal Medicine

## 2015-09-24 VITALS — BP 168/90 | HR 71 | Temp 98.0°F | Resp 12 | Ht 66.5 in | Wt 241.5 lb

## 2015-09-24 DIAGNOSIS — E1121 Type 2 diabetes mellitus with diabetic nephropathy: Secondary | ICD-10-CM

## 2015-09-24 DIAGNOSIS — E669 Obesity, unspecified: Secondary | ICD-10-CM | POA: Diagnosis not present

## 2015-09-24 DIAGNOSIS — E119 Type 2 diabetes mellitus without complications: Secondary | ICD-10-CM | POA: Diagnosis not present

## 2015-09-24 DIAGNOSIS — Z125 Encounter for screening for malignant neoplasm of prostate: Secondary | ICD-10-CM | POA: Diagnosis not present

## 2015-09-24 DIAGNOSIS — I10 Essential (primary) hypertension: Secondary | ICD-10-CM | POA: Diagnosis not present

## 2015-09-24 MED ORDER — HYDROCODONE-ACETAMINOPHEN 5-325 MG PO TABS: 1.0000 | ORAL_TABLET | Freq: Two times a day (BID) | ORAL | Status: DC

## 2015-09-24 NOTE — Progress Notes (Signed)
Subjective:  Patient ID: Daniel Kline, male    DOB: 21-Nov-1945  Age: 70 y.o. MRN: [id number]  CC: The primary encounter diagnosis was Prostate cancer screening. Diagnoses of Diabetes mellitus without complication ([place]), Essential hypertension, Obesity, and Diabetes mellitus with nephropathy ([place]) were also pertinent to this visit.  HPI Daniel Kline presents for establishment of care.  Has not had labs in over a year..  Last meal was at 11:00 .    DM Type 2: Has been taking metformin only since diagnosis in   2014?   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 Infection occurred at [facility] in 2012 required rehab for months .  Micheal blocker managed the infection .  DJD right hip right knee .  Seeing PT.  April 19th sees ortho for right hip replacement discussion.  Likes to exercise but the pain has caused him to stop     Obesity:  Got down to 180 lbs with phentermine x 6 months 2000.   Hypertension;  Gets anxious at the Dr's office.   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   He has past surgical history that includes right shoulder surgery; right elbow surgery; right knee surgery; and spleenectomy.   His family history is not on file.He reports that he quit smoking about 25 years ago. He quit smokeless tobacco use about 9 years ago. He reports that he does not drink alcohol or use illicit drugs.  Outpatient Prescriptions Prior to Visit  Medication Sig Dispense Refill  . Multiple Vitamins-Minerals (MULTIVITAL) tablet Take 1 tablet by mouth daily.      Marland Kitchen aspirin (ASPIR-81) 81 MG  EC tablet Take 81 mg by mouth daily. Reported on 09/24/2015    . aliskiren (TEKTURNA) 150 MG tablet Take 150 mg by mouth daily.      Marland Kitchen amLODipine-olmesartan (AZOR) 10-40 MG per tablet Take 1 tablet by mouth daily.      . Flax OIL Take by mouth daily. Take 1 tsp     . Omega-3 Fatty Acids (FISH OIL) 1000 MG CAPS Take by mouth daily.       No facility-administered medications prior to visit.    Review of Systems:  Patient denies headache, fevers, malaise, unintentional weight loss, skin rash, eye pain, sinus congestion and sinus pain, sore throat, dysphagia,  hemoptysis , cough, dyspnea, wheezing, chest pain, palpitations, orthopnea, edema, abdominal pain, nausea, melena, diarrhea, constipation, flank pain, dysuria, hematuria, urinary  Frequency, nocturia, numbness, tingling, seizures,  Focal weakness, Loss of consciousness,  Tremor, insomnia, depression, anxiety, and suicidal ideation.     Objective:  BP 168/90 mmHg  Pulse 71  Temp(Src) 98 F (36.7 C) (Oral)  Resp 12  Ht 5' 6.5" (1.689 m)  Wt 241 lb 8 oz (109.544 kg)  BMI 38.40 kg/m2  SpO2 94%  Physical Exam:  General appearance: alert, cooperative and appears stated age Ears: normal TM's and external ear canals both ears Throat: lips, mucosa, and tongue normal; teeth and gums normal Neck: no adenopathy, no carotid bruit, supple, symmetrical, trachea midline and thyroid not enlarged, symmetric, no tenderness/mass/nodules Back: symmetric, no  curvature. ROM normal. No CVA tenderness. Lungs: clear to auscultation bilaterally Heart: regular rate and rhythm, S1, S2 normal, no murmur, click, rub or gallop Abdomen: soft, non-tender; bowel sounds normal; no masses,  no organomegaly Pulses: 2+ and symmetric Skin: Skin color, texture, turgor normal. No rashes or lesions Lymph nodes: Cervical, supraclavicular, and axillary nodes normal.   Assessment & Plan:   Problem List Items Addressed This Visit    Essential hypertension    Labile  per patient,  Regimen is not optimal with aliskiren , olmesartan and lisinopril.  Will consider stopping the aliskiren and start Bystolic.    Lab Results  Component Value Date   CREATININE 1.16 09/24/2015   Lab Results  Component Value Date   NA 137 09/24/2015   K 4.2 09/24/2015   CL 99 09/24/2015   CO2 30 09/24/2015         Relevant Medications   EPINEPHrine (EPIPEN 2-PAK) 0.3 mg/0.3 mL IJ SOAJ injection   furosemide (LASIX) 20 MG tablet   lisinopril (PRINIVIL,ZESTRIL) 20 MG tablet   Diabetes mellitus with nephropathy ([place])    Historically well-controlled on metformin .  hemoglobin A1c is 7.1. Patient is up-to-date on eye exams and foot exam is normal today.  Patient is tolerating on ACE/ARB for reduction in proteinuria.    Lab Results  Component Value Date   HGBA1C 7.1* 09/24/2015     Lab Results  Component Value Date   MICROALBUR 42.1* 09/24/2015         Relevant Medications   lisinopril (PRINIVIL,ZESTRIL) 20 MG tablet   metFORMIN (GLUCOPHAGE) 500 MG tablet   Obesity    I have addressed  BMI and recommended wt loss of 10% of body weigh over the next 6 months using a low glycemic index diet and regular exercise a minimum of 5 days per week.        Relevant Medications   metFORMIN (GLUCOPHAGE) 500 MG tablet    Other Visit Diagnoses    Prostate cancer screening    -  Primary    Relevant Orders    PSA, Medicare (Completed)      .A total of 45 minutes was spent with patient more than half of which was spent in counseling patient on the above mentioned issues , reviewing and explaining recent labs and imaging studies done, and coordination of care.   I have discontinued Mr. Silverman's amLODipine-olmesartan, Fish Oil, Flax, aliskiren, and bimatoprost. I am also having him start on HYDROcodone-acetaminophen. Additionally, I am having him maintain his aspirin, MULTIVITAL, clobetasol cream, doxycycline, EPINEPHrine, folic acid, furosemide, gabapentin, hydrOXYzine,  lisinopril, meloxicam, metFORMIN, methotrexate, glucose blood, clorazepate, triamcinolone cream, latanoprost, and Vitamin D-3.  Meds ordered this encounter  Medications  . DISCONTD: amLODipine (NORVASC) 10 MG tablet    Sig: Take 10 mg by mouth daily.   Marland Kitchen DISCONTD: bimatoprost (LUMIGAN) 0.01 % SOLN    Sig: 1 drop.   . clobetasol cream (TEMOVATE) 0.05 %    Sig: Apply 1 application topically 2 (two) times daily.   Marland Kitchen doxycycline (VIBRA-TABS) 100 MG tablet    Sig: Take 1 tablet by mouth 2 (two) times daily.  Marland Kitchen EPINEPHrine (EPIPEN 2-PAK) 0.3 mg/0.3 mL IJ SOAJ injection    Sig: 0.3 mg once.   . folic acid (FOLVITE) 1 MG tablet    Sig: Take 1 mg by mouth daily.   . furosemide (LASIX) 20 MG tablet    Sig: Take 20 mg by mouth daily.   Marland Kitchen gabapentin (NEURONTIN) 300  MG capsule    Sig: Take 300 mg by mouth 2 (two) times daily. 1 cap in the am 2 cap in the pm  . hydrOXYzine (ATARAX/VISTARIL) 25 MG tablet    Sig: Take 25 mg by mouth every 8 (eight) hours as needed.   Marland Kitchen lisinopril (PRINIVIL,ZESTRIL) 20 MG tablet    Sig: Take 20 mg by mouth daily.   . meloxicam (MOBIC) 7.5 MG tablet    Sig: Take 7.5 mg by mouth daily.   . metFORMIN (GLUCOPHAGE) 500 MG tablet    Sig: Take 500 mg by mouth 2 (two) times daily with a meal.   . methotrexate (RHEUMATREX) 2.5 MG tablet    Sig: Take 2.5 mg by mouth once a week. 10 tablets on Wednesday  . glucose blood (ONE TOUCH ULTRA TEST) test strip    Sig:   . clorazepate (TRANXENE) 7.5 MG tablet    Sig: Take 7.5 mg by mouth 2 (two) times daily as needed.   . triamcinolone cream (KENALOG) 0.1 %    Sig: Apply 1 application topically 2 (two) times daily.   Marland Kitchen latanoprost (XALATAN) 0.005 % ophthalmic solution    Sig: Place 1 drop into the right eye at bedtime.  . Cholecalciferol (VITAMIN D-3) 1000 units CAPS    Sig: Take 1 capsule by mouth daily.  Marland Kitchen HYDROcodone-acetaminophen (NORCO/VICODIN) 5-325 MG tablet    Sig: Take 1 tablet by mouth 2 (two) times daily.     Dispense:  60 tablet    Refill:  0    Medications Discontinued During This Encounter  Medication Reason  . aliskiren (TEKTURNA) 150 MG tablet Change in therapy  . amLODipine-olmesartan (AZOR) 10-40 MG per tablet Error  . bimatoprost (LUMIGAN) 0.01 % SOLN Error  . Flax OIL Error  . Omega-3 Fatty Acids (FISH OIL) 1000 MG CAPS Error    Follow-up: Return in about 4 weeks (around 10/22/2015).   Crecencio Mc, MD

## 2015-09-24 NOTE — Progress Notes (Signed)
Pre-visit discussion using our clinic review tool. No additional management support is needed unless otherwise documented below in the visit note.  

## 2015-09-24 NOTE — Patient Instructions (Addendum)
I am checking your kidney function today to make sure meloxicam is safe for you to use  Your blood pressure is high today.  I want you to check it at home  With Daniel Kline's machine   This is my  example of a  "Low carb"  Diet:  It will allow you to lose 4 to 8  lbs  per month if you follow it carefully.  Your goal with exercise is a minimum of 30 minutes of aerobic exercise 5 days per week (Walking does not count once it becomes easy!)    All of the foods can be found at grocery stores and in bulk at [employer].  The Atkins protein bars and shakes are available in more varieties at [employer], [employer] and [place].     7 AM Breakfast:  Choose from the following:  Low carbohydrate Protein  Shakes (I recommend the  Premier Protein chocolate shake, s EAS AdvantEdge "Carb Control" shakes  Or the low carb shakes by Atkins.    2.5 carbs)   Arnold's "Sandwhich Thin"toasted  w/ peanut butter (no jelly: about 20 net carbs  "Bagel Thin" with cream cheese and salmon: about 20 carbs   a scrambled egg/bacon/cheese burrito made with Mission's "carb balance" whole wheat tortilla  (about 10 net carbs )  Regulatory affairs officer (basically a quiche without the pastry crust) that is eaten cold and very convenient way to get your eggs  If you make your own shakes, avoid bananas and pineapple,  And use low carb greek yogurt , unsweetened vanilla soy  Milk,  berries    Try the Heritage Flakes cereal available at [facility] on the bottom shelf, with vanilla flavored soy milk      Avoid cereal and bananas, oatmeal and cream of wheat and grits. They are loaded with carbohydrates!   10 AM: high protein snack:  Protein bar by Atkins (the snack size, under 200 cal, usually < 6 net carbs).    A stick of cheese:  Around 1 carb,  100 cal     Dannon Light n Fit [place] Yogurt  (80 cal, 8 carbs)  Other so called "protein bars" and Greek yogurts tend to be loaded with carbohydrates.  Remember, in food advertising,  the word "energy" is synonymous for " carbohydrate."  Lunch:   A Sandwich using the bread choices listed, Can use any  Eggs,  lunchmeat, grilled meat or canned tuna), avocado, regular mayo/mustard  and cheese.  A Salad using blue cheese, ranch,  Goddess or vinagrette,  Avoid taco shells, croutons or "confetti" and no "candied nuts" but regular nuts OK.   No pretzels, nabs  or chips.  Pickles and miniature sweet peppers are a good low carb alternative that provide a "crunch"  The bread is the only source of carbohydrate in a sandwich and  can be decreased by trying some of these alternatives to traditional loaf bread  Joseph's makes a pita bread and a flat bread that are 50 cal and 4 net carbs available at [place] and [place].  This can be toasted to use with hummous as well  Toufayan makes a low carb flatbread that's 100 cal and 9 net carbs available at [employer] and [employer]'s makes 2 sizes of  Low carb whole wheat tortilla  (The large one is 210 cal and 6 net carbs)  Ezekiel bread is a loaf bread sold in the frozen section of higher end grocery chains,  Very low carb  Avoid "Low fat dressings, as well as [place] dressings They are loaded with sugar!   3 PM/ Mid day  Snack:  Consider  1 ounce of  almonds, walnuts, pistachios, pecans, peanuts,  Macadamia nuts or a nut medley.  Avoid "granola"; the dried cranberries and raisins are loaded with carbohydrates. Mixed nuts as long as there are no raisins,  cranberries or dried fruit.    Try the prosciutto/mozzarella cheese sticks by [employer]  In deli /backery section   High protein      6 PM  Dinner:     Meat/fowl/fish with a green salad, and either broccoli, cauliflower, green beans, spinach, brussel sprouts or  Lima beans. DO NOT BREAD THE PROTEIN!!      There is a low carb pasta by Dreamfield's that is acceptable and tastes great: only 5 digestible carbs/serving.( All grocery stores but BJs carry it )  Try Hurley Cisco Angelo's  chicken piccata or chicken or eggplant parm over low carb pasta.(Lowes and BJs)   Marjory Lies Sanchez's "Carnitas" (pulled pork, no sauce,  0 carbs) or his beef pot roast to make a dinner burrito (at [employer]J's)  Pesto over low carb pasta (bj's sells a good quality pesto in the center refrigerated section of the deli   Try satueeing  Cheral Marker with mushroooms  Whole wheat pasta is still full of digestible carbs and  Not as low in glycemic index as Dreamfield's.   Brown rice is still rice,  So skip the rice and noodles if you eat [place] or [place] (or at least limit to 1/2 cup)  9 PM snack :   Breyer's "low carb" fudgsicle or  ice cream bar (Carb Smart line), or  Weight Watcher's ice cream bar , or another "no sugar added" ice cream;  a serving of fresh berries/cherries with whipped cream   Cheese or DANNON'S LlGHT N FIT GREEK YOGURT  8 ounces of [place] unsweetened almond/cococunut milk    Treat yourself to a parfait made with whipped cream blueberiies, walnuts and vanilla greek yogurt  Avoid bananas, pineapple, grapes  and watermelon on a regular basis because they are high in sugar.  THINK OF THEM AS DESSERT  Remember that snack Substitutions should be less than 10 NET carbs per serving and meals < 20 carbs. Remember to subtract fiber grams to get the "net carbs." .

## 2015-09-25 LAB — COMPREHENSIVE METABOLIC PANEL
ALBUMIN: 4.2 g/dL (ref 3.5–5.2)
ALK PHOS: 60 U/L (ref 39–117)
ALT: 22 U/L (ref 0–53)
AST: 18 U/L (ref 0–37)
BUN: 24 mg/dL — AB (ref 6–23)
CALCIUM: 9.8 mg/dL (ref 8.4–10.5)
CHLORIDE: 99 meq/L (ref 96–112)
CO2: 30 mEq/L (ref 19–32)
CREATININE: 1.16 mg/dL (ref 0.40–1.50)
GFR: 65.96 mL/min (ref >60.00)
Glucose, Bld: 137 mg/dL — ABNORMAL HIGH (ref 70–99)
POTASSIUM: 4.2 meq/L (ref 3.5–5.1)
Sodium: 137 mEq/L (ref 135–145)
TOTAL PROTEIN: 6.3 g/dL (ref 6.0–8.3)
Total Bilirubin: 0.5 mg/dL (ref 0.2–1.2)

## 2015-09-25 LAB — LDL CHOLESTEROL, DIRECT: Direct LDL: 132 mg/dL

## 2015-09-25 LAB — MICROALBUMIN / CREATININE URINE RATIO
Creatinine,U: 79.1 mg/dL
MICROALB UR: 42.1 mg/dL — AB (ref 0.0–1.9)
MICROALB/CREAT RATIO: 53.3 mg/g — AB (ref 0.0–30.0)

## 2015-09-25 LAB — PSA, MEDICARE: PSA: 1.6 ng/mL (ref 0.10–4.00)

## 2015-09-25 LAB — HEMOGLOBIN A1C: Hgb A1c MFr Bld: 7.1 % — ABNORMAL HIGH (ref 4.6–6.5)

## 2015-09-26 ENCOUNTER — Telehealth: Payer: Self-pay | Admitting: Internal Medicine

## 2015-09-26 ENCOUNTER — Other Ambulatory Visit: Payer: Self-pay | Admitting: Internal Medicine

## 2015-09-26 NOTE — Telephone Encounter (Signed)
Pt called returning your call. Call pt @ [phone]. Thank you!

## 2015-09-27 DIAGNOSIS — N183 Chronic kidney disease, stage 3 (moderate): Secondary | ICD-10-CM

## 2015-09-27 DIAGNOSIS — E669 Obesity, unspecified: Secondary | ICD-10-CM

## 2015-09-27 DIAGNOSIS — N1832 Chronic kidney disease, stage 3b: Secondary | ICD-10-CM | POA: Insufficient documentation

## 2015-09-27 DIAGNOSIS — N184 Chronic kidney disease, stage 4 (severe): Secondary | ICD-10-CM | POA: Insufficient documentation

## 2015-09-27 NOTE — Assessment & Plan Note (Addendum)
Labile per patient,  Regimen is not optimal with aliskiren , olmesartan and lisinopril.  Will consider stopping the aliskiren and start Bystolic.    Lab Results  Component Value Date   CREATININE 1.16 09/24/2015   Lab Results  Component Value Date   NA 137 09/24/2015   K 4.2 09/24/2015   CL 99 09/24/2015   CO2 30 09/24/2015

## 2015-09-27 NOTE — Assessment & Plan Note (Signed)
I have addressed  BMI and recommended wt loss of 10% of body weigh over the next 6 months using a low glycemic index diet and regular exercise a minimum of 5 days per week.   

## 2015-09-27 NOTE — Assessment & Plan Note (Addendum)
Historically well-controlled on metformin .  hemoglobin A1c is 7.1. Patient is up-to-date on eye exams and foot exam is normal today.  Patient is tolerating on ACE/ARB for reduction in proteinuria.    Lab Results  Component Value Date   HGBA1C 7.1* 09/24/2015     Lab Results  Component Value Date   MICROALBUR 42.1* 09/24/2015

## 2015-09-29 DIAGNOSIS — M1711 Unilateral primary osteoarthritis, right knee: Secondary | ICD-10-CM | POA: Diagnosis not present

## 2015-09-29 DIAGNOSIS — G8929 Other chronic pain: Secondary | ICD-10-CM | POA: Diagnosis not present

## 2015-09-29 DIAGNOSIS — M1611 Unilateral primary osteoarthritis, right hip: Secondary | ICD-10-CM | POA: Diagnosis not present

## 2015-09-29 DIAGNOSIS — M25561 Pain in right knee: Secondary | ICD-10-CM | POA: Diagnosis not present

## 2015-09-29 DIAGNOSIS — M25551 Pain in right hip: Secondary | ICD-10-CM | POA: Diagnosis not present

## 2015-10-01 DIAGNOSIS — Z79899 Other long term (current) drug therapy: Secondary | ICD-10-CM | POA: Diagnosis not present

## 2015-10-01 DIAGNOSIS — C84A0 Cutaneous T-cell lymphoma, unspecified, unspecified site: Secondary | ICD-10-CM | POA: Diagnosis not present

## 2015-10-06 DIAGNOSIS — M25561 Pain in right knee: Secondary | ICD-10-CM | POA: Diagnosis not present

## 2015-10-06 DIAGNOSIS — M1611 Unilateral primary osteoarthritis, right hip: Secondary | ICD-10-CM | POA: Diagnosis not present

## 2015-10-06 DIAGNOSIS — M1711 Unilateral primary osteoarthritis, right knee: Secondary | ICD-10-CM | POA: Diagnosis not present

## 2015-10-06 DIAGNOSIS — M25551 Pain in right hip: Secondary | ICD-10-CM | POA: Diagnosis not present

## 2015-10-06 DIAGNOSIS — G8929 Other chronic pain: Secondary | ICD-10-CM | POA: Diagnosis not present

## 2015-10-07 ENCOUNTER — Telehealth: Payer: Self-pay | Admitting: Internal Medicine

## 2015-10-07 DIAGNOSIS — C84A0 Cutaneous T-cell lymphoma, unspecified, unspecified site: Secondary | ICD-10-CM | POA: Diagnosis not present

## 2015-10-07 NOTE — Telephone Encounter (Signed)
To Dr Derrel Nip: Pt wife would like to speak to Dr Derrel Nip regarding pt appt at [facility] on 10/21/2015. Wife has a couple of questions. Call wife @ [phone]. Thank you!

## 2015-10-08 NOTE — Telephone Encounter (Signed)
[id number]

## 2015-10-08 NOTE — Telephone Encounter (Signed)
Yes it is very important to stop the metformin before the procedure. as directed by the radiologist..  To prevent kidney damage. Follow his instructions on resuming it as well

## 2015-10-08 NOTE — Telephone Encounter (Signed)
Pt is having a CT scan on 10/21/15, the pt was told to stop Metformin 48hrs before the CT. Pt wants to know if this is safe. Please advise, thanks

## 2015-10-08 NOTE — Telephone Encounter (Signed)
Notified pt of Dr. Tullo's comments, pt verbalized understanding 

## 2015-10-14 DIAGNOSIS — H401111 Primary open-angle glaucoma, right eye, mild stage: Secondary | ICD-10-CM | POA: Diagnosis not present

## 2015-10-14 DIAGNOSIS — H40052 Ocular hypertension, left eye: Secondary | ICD-10-CM | POA: Diagnosis not present

## 2015-10-16 ENCOUNTER — Encounter: Payer: Self-pay | Admitting: *Deleted

## 2015-10-21 DIAGNOSIS — C84A0 Cutaneous T-cell lymphoma, unspecified, unspecified site: Secondary | ICD-10-CM | POA: Diagnosis not present

## 2015-10-21 DIAGNOSIS — C8590 Non-Hodgkin lymphoma, unspecified, unspecified site: Secondary | ICD-10-CM | POA: Diagnosis not present

## 2015-10-21 DIAGNOSIS — R935 Abnormal findings on diagnostic imaging of other abdominal regions, including retroperitoneum: Secondary | ICD-10-CM | POA: Diagnosis not present

## 2015-10-28 ENCOUNTER — Encounter: Payer: Self-pay | Admitting: Internal Medicine

## 2015-10-28 ENCOUNTER — Ambulatory Visit (INDEPENDENT_AMBULATORY_CARE_PROVIDER_SITE_OTHER): Payer: PPO | Admitting: Internal Medicine

## 2015-10-28 VITALS — BP 170/90 | HR 73 | Temp 97.6°F | Resp 12 | Ht 67.0 in | Wt 239.8 lb

## 2015-10-28 DIAGNOSIS — R002 Palpitations: Secondary | ICD-10-CM

## 2015-10-28 DIAGNOSIS — I498 Other specified cardiac arrhythmias: Secondary | ICD-10-CM

## 2015-10-28 DIAGNOSIS — I8312 Varicose veins of left lower extremity with inflammation: Secondary | ICD-10-CM

## 2015-10-28 DIAGNOSIS — I872 Venous insufficiency (chronic) (peripheral): Secondary | ICD-10-CM

## 2015-10-28 DIAGNOSIS — T63444S Toxic effect of venom of bees, undetermined, sequela: Secondary | ICD-10-CM

## 2015-10-28 DIAGNOSIS — I10 Essential (primary) hypertension: Secondary | ICD-10-CM

## 2015-10-28 DIAGNOSIS — I8311 Varicose veins of right lower extremity with inflammation: Secondary | ICD-10-CM

## 2015-10-28 DIAGNOSIS — E1121 Type 2 diabetes mellitus with diabetic nephropathy: Secondary | ICD-10-CM

## 2015-10-28 DIAGNOSIS — T8579XS Infection and inflammatory reaction due to other internal prosthetic devices, implants and grafts, sequela: Secondary | ICD-10-CM

## 2015-10-28 DIAGNOSIS — IMO0001 Reserved for inherently not codable concepts without codable children: Secondary | ICD-10-CM

## 2015-10-28 DIAGNOSIS — A4902 Methicillin resistant Staphylococcus aureus infection, unspecified site: Secondary | ICD-10-CM

## 2015-10-28 MED ORDER — HYDROCODONE-ACETAMINOPHEN 5-325 MG PO TABS: 1.0000 | ORAL_TABLET | Freq: Three times a day (TID) | ORAL | Status: DC | PRN

## 2015-10-28 MED ORDER — FAMOTIDINE 20 MG PO TABS: 20.0000 mg | ORAL_TABLET | Freq: Two times a day (BID) | ORAL | Status: DC

## 2015-10-28 MED ORDER — EPINEPHRINE 0.3 MG/0.3ML IJ SOAJ: 0.3000 mg | Freq: Once | INTRAMUSCULAR | Status: DC

## 2015-10-28 MED ORDER — MONTELUKAST SODIUM 10 MG PO TABS: 10.0000 mg | ORAL_TABLET | Freq: Every day | ORAL | Status: DC

## 2015-10-28 MED ORDER — HYDROCODONE-ACETAMINOPHEN 5-325 MG PO TABS: 1.0000 | ORAL_TABLET | Freq: Two times a day (BID) | ORAL | Status: DC

## 2015-10-28 NOTE — Progress Notes (Signed)
Subjective:  Patient ID: Daniel Kline, male    DOB: 1945-09-10  Age: 70 y.o. MRN: [id number]  CC: The primary encounter diagnosis was Essential hypertension. 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  HPI Daniel Kline presents for one month follow up on multiple issues including diabetes  And hypertensin.   He states that his home readings  Are labile but never at goal.    Used aleve PM last night bc his right Hip was sore after working out  ,  Uses it 2-3 times week wakes  up feeling rested. Used to work 3rd shift, 12 hours, 7 p to 7 a.  Gets sleepy in the afternoon.  Goes to bed late 2 or 3 am still as a habit, takes gabapentin and Snores  A lot .  No prior sleep study.   DM eye exam done recently in [id number] Dr Manuella Ghazi [facility] in [place]   T cell lymphoma managed at [facility]   History of left hip arthroplasty infected  with MRSA. May 2014  Placed on lifelong doxycycline  100 mg bid by Le[facility] Blocker   Having right hip orthopedic evaluation at April 19,  Getting PT for knee and hip .  At [facility]       Lab Results  Component Value Date   HGBA1C 7.1* 09/24/2015     Outpatient Prescriptions Prior to Visit  Medication Sig Dispense Refill  . amLODipine (NORVASC) 10 MG tablet TAKE ONE TABLET BY MOUTH EVERY DAY 90 tablet 3  . Cholecalciferol (VITAMIN D-3) 1000 units CAPS Take 2 capsules by mouth daily.     . clobetasol cream (TEMOVATE) [id number] % Apply 1 application topically 2 (two) times daily.     . clorazepate (TRANXENE) 7.5 MG tablet Take 7.5 mg by mouth 2 (two) times daily as needed.     . doxycycline (VIBRA-TABS) 100 MG  tablet Take 1 tablet by mouth 2 (two) times daily.    . folic acid (FOLVITE) 1 MG tablet Take 1 mg by mouth daily.     . furosemide (LASIX) 20 MG tablet Take 20 mg by mouth daily.     Marland Kitchen gabapentin (NEURONTIN) 300 MG capsule Take 300 mg by mouth 2 (two) times daily. 1 cap in the am 2 cap in the pm    . glucose blood (ONE TOUCH ULTRA TEST) test strip     . hydrOXYzine (ATARAX/VISTARIL) 25 MG tablet Take 25 mg by mouth every 8 (eight) hours as needed.     . latanoprost (XALATAN) 0.005 % ophthalmic solution Place 1 drop into the right eye at bedtime.    Marland Kitchen lisinopril (PRINIVIL,ZESTRIL) 20 MG tablet Take 20 mg by mouth daily.     . meloxicam (MOBIC) 7.5 MG tablet Take 7.5 mg by mouth daily.     . metFORMIN (GLUCOPHAGE) 500 MG tablet Take 500 mg by mouth 2 (two) times daily with a meal.     . methotrexate (RHEUMATREX) 2.5 MG tablet Take 2.5 mg by mouth once a week. 10 tablets on Wednesday    . Multiple Vitamins-Minerals (MULTIVITAL) tablet Take 1 tablet by mouth daily.      Marland Kitchen triamcinolone cream (KENALOG) 0.1 % Apply 1 application topically 2 (two) times daily.     Marland Kitchen  EPINEPHrine (EPIPEN 2-PAK) 0.3 mg/0.3 mL IJ SOAJ injection 0.3 mg once.     Marland Kitchen aspirin (ASPIR-81) 81 MG EC tablet Take 81 mg by mouth daily. Reported on 10/28/2015    . HYDROcodone-acetaminophen (NORCO/VICODIN) 5-325 MG tablet Take 1 tablet by mouth 2 (two) times daily. (Patient not taking: Reported on 10/28/2015) 60 tablet 0   No facility-administered medications prior to visit.    Review of Systems;  Patient denies headache, fevers, malaise, unintentional weight loss, skin rash, eye pain, sinus congestion and sinus pain, sore throat, dysphagia,  hemoptysis , cough, dyspnea, wheezing, chest pain, palpitations, orthopnea, edema, abdominal pain, nausea, melena, diarrhea, constipation, flank pain, dysuria, hematuria, urinary  Frequency, nocturia, numbness, tingling, seizures,  Focal weakness, Loss of consciousness,  Tremor, insomnia,  depression, anxiety, and suicidal ideation.      Objective:  BP 170/90 mmHg  Pulse 73  Temp(Src) 97.6 F (36.4 C) (Oral)  Resp 12  Ht 5\' 7"  (1.702 m)  Wt 239 lb 12 oz (108.75 kg)  BMI 37.54 kg/m2  SpO2 94%  BP Readings from Last 3 Encounters:  10/28/15 170/90  09/24/15 168/90  01/12/10 157/91    Wt Readings from Last 3 Encounters:  10/28/15 239 lb 12 oz (108.75 kg)  09/24/15 241 lb 8 oz (109.544 kg)  01/12/10 217 lb (98.431 kg)    General appearance: alert, cooperative and appears stated age Ears: normal TM's and external ear canals both ears Throat: lips, mucosa, and tongue normal; teeth and gums normal Neck: no adenopathy, no carotid bruit, supple, symmetrical, trachea midline and thyroid not enlarged, symmetric, no tenderness/mass/nodules Back: symmetric, no curvature. ROM normal. No CVA tenderness. Lungs: clear to auscultation bilaterally Heart: irreg irreg  S1, S2 normal, no murmur, click, rub or gallop Abdomen: soft, non-tender; bowel sounds normal; no masses,  no organomegaly Pulses: 2+ and symmetric Skin: brawny skin changes bilaterally  Lymph nodes: Cervical, supraclavicular, and axillary nodes normal. Ext:  Onychomycosis affecting both feet   Lab Results  Component Value Date   HGBA1C 7.1* 09/24/2015    Lab Results  Component Value Date   CREATININE 1.16 09/24/2015   CREATININE 1.52* 10/28/2011   CREATININE 1.53* 10/27/2011    Lab Results  Component Value Date   WBC 6.7 10/18/2011   HGB 8.5* 10/30/2011   HCT 33.8* 10/18/2011   PLT 157 10/27/2011   GLUCOSE 137* 09/24/2015   LDLDIRECT 132.0 09/24/2015   ALT 22 09/24/2015   AST 18 09/24/2015   NA 137 09/24/2015   K 4.2 09/24/2015   CL 99 09/24/2015   CREATININE 1.16 09/24/2015   BUN 24* 09/24/2015   CO2 30 09/24/2015   PSA 1.60 09/24/2015   HGBA1C 7.1* 09/24/2015   MICROALBUR 42.1* 09/24/2015    No results found.  Assessment & Plan:   Problem List Items Addressed This Visit     Essential hypertension - Primary    Uncontrolled.  Advised to stop all NSAIDs and increase lisinopril to 40 mg daily. Return on thursday  With home BP monitor . Consider secondary causes including OSA and RAS       Relevant Medications   EPINEPHrine (EPIPEN 2-PAK) 0.3 mg/0.3 mL IJ SOAJ injection   Diabetes mellitus with nephropathy ([place])     well-controlled on current medications.  . Patient is up-to-date on eye exams and foot exam is normal today.. Patient is tolerating statin therapy for CAD risk reduction and on ACE/ARB for reduction in proteinuria.  .  Lab Results  Component Value Date  HGBA1C 7.1* 09/24/2015   Lab Results  Component Value Date   MICROALBUR 42.1* 09/24/2015         Sinus arrhythmia    EKG done to evaluate irregular heart beat and rule out atrial fibrillation.  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       Venous stasis dermatitis of both lower extremities    Other Visit Diagnoses    Palpitations        Relevant Orders    EKG 12-Lead (Completed)      A total of 40 minutes was spent with patient more than half of which was spent in counseling patient on the above mentioned issues , reviewing and explaining recent labs and imaging studies done, and coordination of care.  I have changed Mr. Kelleher's EPINEPHrine.  I am also having him start on montelukast and famotidine. Additionally, I am having him maintain his aspirin, MULTIVITAL, clobetasol cream, doxycycline, folic acid, furosemide, gabapentin, hydrOXYzine, lisinopril, meloxicam, metFORMIN, methotrexate, glucose blood, clorazepate, triamcinolone cream, latanoprost, Vitamin D-3, amLODipine, and HYDROcodone-acetaminophen.  Meds ordered this encounter  Medications  . DISCONTD: HYDROcodone-acetaminophen (NORCO/VICODIN) 5-325 MG tablet    Sig: Take 1 tablet by mouth 2 (two) times daily.    Dispense:  90 tablet    Refill:  0    May refill on or after November 28 2015  . DISCONTD: HYDROcodone-acetaminophen (NORCO/VICODIN) 5-325 MG tablet    Sig: Take 1 tablet by mouth 2 (two) times daily.    Dispense:  90 tablet    Refill:  0    May refill on or after November 28 2015  . DISCONTD: HYDROcodone-acetaminophen (NORCO/VICODIN) 5-325 MG tablet    Sig: Take 1 tablet by mouth 3 (three) times daily as needed for moderate pain.    Dispense:  90 tablet    Refill:  0    May refill on or after November 28 2015  . DISCONTD: HYDROcodone-acetaminophen (NORCO/VICODIN) 5-325 MG tablet    Sig: Take 1 tablet by mouth 3 (three) times daily as needed for moderate pain.    Dispense:  90 tablet    Refill:  0    May refill on or after Dec 28 2015  . montelukast (SINGULAIR) 10 MG tablet    Sig: Take 1 tablet (10 mg total) by mouth at bedtime.    Dispense:  30 tablet    Refill:  3  . famotidine (PEPCID) 20 MG tablet    Sig: Take 1 tablet (20 mg total) by mouth 2 (two) times daily.    Dispense:  60 tablet    Refill:  5  . EPINEPHrine (EPIPEN 2-PAK) 0.3 mg/0.3 mL IJ SOAJ injection    Sig: Inject 0.3 mLs (0.3 mg total) into the muscle once.    Dispense:  1 Device    Refill:  1  . HYDROcodone-acetaminophen (NORCO/VICODIN) 5-325 MG tablet    Sig: Take 1 tablet by mouth 3 (three) times daily as needed for moderate pain.    Dispense:  90 tablet    Refill:  0    Medications  Discontinued During This Encounter  Medication Reason  . HYDROcodone-acetaminophen (NORCO/VICODIN) 5-325 MG tablet Reorder  . HYDROcodone-acetaminophen (NORCO/VICODIN) 5-325 MG tablet Reorder  . HYDROcodone-acetaminophen (NORCO/VICODIN) 5-325 MG tablet Reorder  . HYDROcodone-acetaminophen (NORCO/VICODIN) 5-325 MG tablet Reorder  . EPINEPHrine (EPIPEN 2-PAK) 0.3 mg/0.3 mL IJ SOAJ injection Reorder  . HYDROcodone-acetaminophen (NORCO/VICODIN) 5-325 MG tablet Reorder    Follow-up: Return in about 1 month (around 11/28/2015).   Crecencio Mc, MD

## 2015-10-28 NOTE — Patient Instructions (Addendum)
Your blood pressure is too high.  Bring your blood pressure monitor back Thursday afternoon so Juliann Pulse can check your machine and see if it is accurate  Stop using Aleve , meloxicam and motrin for now . They may be raising your blood pressure  Increase the lisinpril to 40 mg daily   I am refilling your Vicodin so you can use it 3 times daily for pain     I am putting you on the following medication to block  the histamine release that causes anaphylaxis when you get stung by a bee.  The epinephrine pen has ben refilled,  But it may be too $$$$$ , so preventing the reaction is KEY:  Allegra claritin or zytec \\once  daily    (generic zyrtec, which is cetirizine.  Allegra is availabel generically as fexofenadine and it comes in 60 mg and 180 mg once daily strengths.  The claritin is also available generically as loratidine ).   Generic pepcid 20 mg twice daily (famotidne)  Singulair once daily  ( I have to prescribe )    I will call in a medicatipn to treat your toenail fungus   Please return in one month to discuss your other problems

## 2015-10-28 NOTE — Progress Notes (Signed)
Pre-visit discussion using our clinic review tool. No additional management support is needed unless otherwise documented below in the visit note.  

## 2015-10-29 DIAGNOSIS — Z79899 Other long term (current) drug therapy: Secondary | ICD-10-CM | POA: Diagnosis not present

## 2015-10-29 DIAGNOSIS — C84A0 Cutaneous T-cell lymphoma, unspecified, unspecified site: Secondary | ICD-10-CM | POA: Diagnosis not present

## 2015-10-29 DIAGNOSIS — I4892 Unspecified atrial flutter: Secondary | ICD-10-CM | POA: Insufficient documentation

## 2015-10-29 DIAGNOSIS — I872 Venous insufficiency (chronic) (peripheral): Secondary | ICD-10-CM | POA: Insufficient documentation

## 2015-10-29 DIAGNOSIS — L97909 Non-pressure chronic ulcer of unspecified part of unspecified lower leg with unspecified severity: Secondary | ICD-10-CM | POA: Insufficient documentation

## 2015-10-29 DIAGNOSIS — T63441A Toxic effect of venom of bees, accidental (unintentional), initial encounter: Secondary | ICD-10-CM | POA: Insufficient documentation

## 2015-10-29 NOTE — Assessment & Plan Note (Signed)
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

## 2015-10-29 NOTE — Assessment & Plan Note (Signed)
Epi pen refilled,  But due to potential cost of rx, recommended prophylaxis with allegra/famotidine and Singulair.

## 2015-10-29 NOTE — Assessment & Plan Note (Addendum)
Uncontrolled.  Advised to stop all NSAIDs and increase lisinopril to 40 mg daily. Return on thursday  With home BP monitor . Consider secondary causes including OSA and RAS

## 2015-10-29 NOTE — Assessment & Plan Note (Signed)
Continue lifelong doxcycline for suppression of MRSA from hip prosthesis per previous ID opinion ([facility], [facility])

## 2015-10-29 NOTE — Assessment & Plan Note (Signed)
well-controlled on current medications.  . Patient is up-to-date on eye exams and foot exam is normal today.. Patient is tolerating statin therapy for CAD risk reduction and on ACE/ARB for reduction in proteinuria.  .  Lab Results  Component Value Date   HGBA1C 7.1* 09/24/2015   Lab Results  Component Value Date   MICROALBUR 42.1* 09/24/2015

## 2015-10-29 NOTE — Assessment & Plan Note (Signed)
EKG done to evaluate irregular heart beat and rule out atrial fibrillation.  Sinus rhythm with frequent PACs noted,  No results found for: TSH

## 2015-10-30 ENCOUNTER — Ambulatory Visit (INDEPENDENT_AMBULATORY_CARE_PROVIDER_SITE_OTHER): Payer: PPO

## 2015-10-30 VITALS — BP 150/78 | HR 72 | Resp 18

## 2015-10-30 DIAGNOSIS — I10 Essential (primary) hypertension: Secondary | ICD-10-CM

## 2015-10-30 NOTE — Progress Notes (Signed)
Patient needs follow up with PCP. BP mildly elevated. Not urgent.

## 2015-10-30 NOTE — Progress Notes (Signed)
Patient came in for a BP check.  Patient increased is lisinopril per Dr. Lupita Dawn request to 40mg  daily.   He is also taking Amlodopine 10mg  daily.  Patient doesn't constantly take his medications at a consistent time.  Checked his BP with his home blood pressure cuff and then with two different manual cuffs.   Home cuff checked twice, 210/86 initially and then 185/103.  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 Per the patient. See vitals for details.    Please advise. Thanks  Dr. Lacinda Axon this was sent as Dr. Derrel Nip is not in the office.  Thanks

## 2015-10-30 NOTE — Progress Notes (Signed)
Tell patient to get a new home BP machine since it is grossly inaccurate.    His lisinopril dose was just increased, and he has a return visit in one month

## 2015-10-31 NOTE — Progress Notes (Signed)
Left him a detailed VM . Thanks

## 2015-11-07 ENCOUNTER — Other Ambulatory Visit: Payer: Self-pay | Admitting: Internal Medicine

## 2015-11-07 MED ORDER — LISINOPRIL 40 MG PO TABS: 40.0000 mg | ORAL_TABLET | Freq: Every day | ORAL | Status: DC

## 2015-11-26 DIAGNOSIS — M1711 Unilateral primary osteoarthritis, right knee: Secondary | ICD-10-CM | POA: Diagnosis not present

## 2015-11-26 DIAGNOSIS — M1611 Unilateral primary osteoarthritis, right hip: Secondary | ICD-10-CM | POA: Diagnosis not present

## 2015-11-28 ENCOUNTER — Ambulatory Visit (INDEPENDENT_AMBULATORY_CARE_PROVIDER_SITE_OTHER): Payer: PPO | Admitting: Internal Medicine

## 2015-11-28 ENCOUNTER — Encounter: Payer: Self-pay | Admitting: Internal Medicine

## 2015-11-28 VITALS — BP 170/88 | HR 73 | Temp 97.5°F | Resp 12 | Ht 67.0 in | Wt 236.2 lb

## 2015-11-28 DIAGNOSIS — R5383 Other fatigue: Secondary | ICD-10-CM | POA: Diagnosis not present

## 2015-11-28 DIAGNOSIS — E1121 Type 2 diabetes mellitus with diabetic nephropathy: Secondary | ICD-10-CM | POA: Diagnosis not present

## 2015-11-28 DIAGNOSIS — M1611 Unilateral primary osteoarthritis, right hip: Secondary | ICD-10-CM

## 2015-11-28 DIAGNOSIS — N182 Chronic kidney disease, stage 2 (mild): Secondary | ICD-10-CM | POA: Diagnosis not present

## 2015-11-28 DIAGNOSIS — I10 Essential (primary) hypertension: Secondary | ICD-10-CM | POA: Diagnosis not present

## 2015-11-28 LAB — CBC WITH DIFFERENTIAL/PLATELET
BASOS ABS: 78 {cells}/uL (ref 0–200)
Basophils Relative: 1 %
EOS PCT: 1 %
Eosinophils Absolute: 78 cells/uL (ref 15–500)
HEMATOCRIT: 43.6 % (ref 38.5–50.0)
HEMOGLOBIN: 14.4 g/dL (ref 13.2–17.1)
LYMPHS ABS: 1248 {cells}/uL (ref 850–3900)
LYMPHS PCT: 16 %
MCH: 30.2 pg (ref 27.0–33.0)
MCHC: 33 g/dL (ref 32.0–36.0)
MCV: 91.4 fL (ref 80.0–100.0)
MPV: 9.8 fL (ref 7.5–12.5)
Monocytes Absolute: 702 cells/uL (ref 200–950)
Monocytes Relative: 9 %
NEUTROS PCT: 73 %
Neutro Abs: 5694 cells/uL (ref 1500–7800)
Platelets: 242 10*3/uL (ref 140–400)
RBC: 4.77 MIL/uL (ref 4.20–5.80)
RDW: 16.1 % — ABNORMAL HIGH (ref 11.0–15.0)
WBC: 7.8 10*3/uL (ref 3.8–10.8)

## 2015-11-28 MED ORDER — HYDROCODONE-ACETAMINOPHEN 5-325 MG PO TABS: 1.0000 | ORAL_TABLET | Freq: Four times a day (QID) | ORAL | Status: DC | PRN

## 2015-11-28 MED ORDER — HYDROCODONE-ACETAMINOPHEN 5-325 MG PO TABS: 1.0000 | ORAL_TABLET | Freq: Three times a day (TID) | ORAL | Status: DC | PRN

## 2015-11-28 MED ORDER — LOSARTAN POTASSIUM 100 MG PO TABS: 100.0000 mg | ORAL_TABLET | Freq: Every day | ORAL | Status: DC

## 2015-11-28 NOTE — Progress Notes (Signed)
Subjective:  Patient ID: Daniel Kline, male    DOB: Sep 29, 1945  Age: 70 y.o. MRN: [id number]  CC: The primary encounter diagnosis was Essential hypertension. Diagnoses of Other fatigue, CKD (chronic kidney disease), stage II, Diabetes mellitus with nephropathy ([place]), and Primary osteoarthritis of right hip were also pertinent to this visit.  HPI [name] presents for one month follow up on uncontrolled hypertension . At last visit his NSAID use was examined and meds were adjusted suspended. INCREASED DOSE OF lisinopril to  40 mg daily and suspended all NSAIDs.    BP still elevated. Told that he snores on occasion ,  No apneic spells reported by wife.  Wakes up feeling pretty good most mornings.  Sometime  daytime somnolence after eating lunch  .  No trouble staying awake at night .  OA/DJD right LMB:[id number] the use of NSAIDs he reports persistent severe pain  Requiring use of Vicodin daily   CT scan showed kidney stones and an enlarging renal mass.  further workup at [facility] in progress  Ay 1 CT scheduled    Outpatient Prescriptions Prior to Visit  Medication Sig Dispense Refill  . amLODipine (NORVASC) 10 MG tablet TAKE ONE TABLET BY MOUTH EVERY DAY 90 tablet 3  . aspirin (ASPIR-81) 81 MG EC tablet Take 81 mg by mouth daily. Reported on 10/28/2015    . Cholecalciferol (VITAMIN D-3) 1000 units CAPS Take 2 capsules by mouth daily.     . clobetasol cream (TEMOVATE) 2.01 % Apply 1 application topically 2 (two) times daily.     . clorazepate (TRANXENE) 7.5 MG tablet Take 7.5 mg by mouth 2 (two) times daily as needed.     . doxycycline (VIBRA-TABS) 100 MG tablet Take 1 tablet by mouth 2 (two) times daily.    Marland Kitchen EPINEPHrine (EPIPEN 2-PAK) 0.3 mg/0.3 mL IJ SOAJ injection Inject 0.3 mLs (0.3 mg total) into the muscle once. 1 Device 1  . famotidine (PEPCID) 20 MG tablet Take 1 tablet (20 mg total) by mouth 2 (two) times daily. 60 tablet 5  . folic acid (FOLVITE) 1 MG tablet TAKE ONE TABLET BY  MOUTH EVERY DAY EXCEPT ON METHOTREXATE DAYS 90 tablet 1  . furosemide (LASIX) 20 MG tablet Take 20 mg by mouth daily.     Marland Kitchen gabapentin (NEURONTIN) 300 MG capsule Take 300 mg by mouth 2 (two) times daily. 1 cap in the am 2 cap in the pm    . glucose blood (ONE TOUCH ULTRA TEST) test strip     . hydrOXYzine (ATARAX/VISTARIL) 25 MG tablet Take 25 mg by mouth every 8 (eight) hours as needed.     . latanoprost (XALATAN) 0.005 % ophthalmic solution Place 1 drop into the right eye at bedtime.    . metFORMIN (GLUCOPHAGE) 500 MG tablet Take 500 mg by mouth 2 (two) times daily with a meal.     . methotrexate (RHEUMATREX) 2.5 MG tablet Take 2.5 mg by mouth once a week. 10 tablets on Wednesday    . montelukast (SINGULAIR) 10 MG tablet Take 1 tablet (10 mg total) by mouth at bedtime. 30 tablet 3  . Multiple Vitamins-Minerals (MULTIVITAL) tablet Take 1 tablet by mouth daily.      Marland Kitchen triamcinolone cream (KENALOG) 0.1 % Apply 1 application topically 2 (two) times daily.     Marland Kitchen HYDROcodone-acetaminophen (NORCO/VICODIN) 5-325 MG tablet Take 1 tablet by mouth 3 (three) times daily as needed for moderate pain. 90 tablet 0  . lisinopril (PRINIVIL,ZESTRIL)  40 MG tablet Take 1 tablet (40 mg total) by mouth daily. 30 tablet 3  . meloxicam (MOBIC) 7.5 MG tablet Take 7.5 mg by mouth daily. Reported on 11/28/2015     No facility-administered medications prior to visit.    Review of Systems;  Patient denies headache, fevers, malaise, unintentional weight loss, skin rash, eye pain, sinus congestion and sinus pain, sore throat, dysphagia,  hemoptysis , cough, dyspnea, wheezing, chest pain, palpitations, orthopnea, edema, abdominal pain, nausea, melena, diarrhea, constipation, flank pain, dysuria, hematuria, urinary  Frequency, nocturia, numbness, tingling, seizures,  Focal weakness, Loss of consciousness,  Tremor, insomnia, depression, anxiety, and suicidal ideation.      Objective:  BP 170/88 mmHg  Pulse 73  Temp(Src)  97.5 F (36.4 C) (Oral)  Resp 12  Ht 5' 7"  (1.702 m)  Wt 236 lb 4 oz (107.162 kg)  BMI 36.99 kg/m2  SpO2 97%  BP Readings from Last 3 Encounters:  11/28/15 170/88  10/30/15 150/78  10/28/15 170/90    Wt Readings from Last 3 Encounters:  11/28/15 236 lb 4 oz (107.162 kg)  10/28/15 239 lb 12 oz (108.75 kg)  09/24/15 241 lb 8 oz (109.544 kg)    General appearance: alert, cooperative and appears stated age Ears: normal TM's and external ear canals both ears Throat: lips, mucosa, and tongue normal; teeth and gums normal Neck: no adenopathy, no carotid bruit, supple, symmetrical, trachea midline and thyroid not enlarged, symmetric, no tenderness/mass/nodules Back: symmetric, no curvature. ROM normal. No CVA tenderness. Lungs: clear to auscultation bilaterally Heart: regular rate and rhythm, S1, S2 normal, no murmur, click, rub or gallop Abdomen: soft, non-tender; bowel sounds normal; no masses,  no organomegaly Pulses: 2+ and symmetric Skin: Skin color, texture, turgor normal. No rashes or lesions Lymph nodes: Cervical, supraclavicular, and axillary nodes normal.  Lab Results  Component Value Date   HGBA1C 7.1* 09/24/2015    Lab Results  Component Value Date   CREATININE 1.20* 11/28/2015   CREATININE 1.16 09/24/2015   CREATININE 1.52* 10/28/2011    Lab Results  Component Value Date   WBC 7.8 11/28/2015   HGB 14.4 11/28/2015   HCT 43.6 11/28/2015   PLT 242 11/28/2015   GLUCOSE 65 11/28/2015   LDLDIRECT 132.0 09/24/2015   ALT 19 11/28/2015   AST 17 11/28/2015   NA 139 11/28/2015   K 4.5 11/28/2015   CL 99 11/28/2015   CREATININE 1.20* 11/28/2015   BUN 24 11/28/2015   CO2 29 11/28/2015   PSA 1.60 09/24/2015   HGBA1C 7.1* 09/24/2015   MICROALBUR 42.1* 09/24/2015    No results found.  Assessment & Plan:   Problem List Items Addressed This Visit    Essential hypertension - Primary    Not controlled on maximal dose of lisinopril,  Changing to losartan 100 mg  daily .  Will consider secondary causes if not controlled on losartan and amlodipine       Relevant Medications   losartan (COZAAR) 100 MG tablet   Other Relevant Orders   Comp Met (CMET) (Completed)   Diabetes mellitus with nephropathy (H[facility])           Relevant Medications   losartan (COZAAR) 100 MG tablet   Degenerative arthritis of hip    Refilling vicodin for use up to 4 times daily pending  Orthopedic follow up in June.        Relevant Medications   HYDROcodone-acetaminophen (NORCO/VICODIN) 5-325 MG tablet   CKD (chronic kidney disease), stage II  Improved with suspension of NSAIDs.         Other Visit Diagnoses    Other fatigue        Relevant Orders    CBC with Differential/Platelet (Completed)      A total of 25 minutes of face to face time was spent with patient more than half of which was spent in counselling about the above mentioned conditions  and coordination of care  I have discontinued Mr. Hummel's meloxicam and lisinopril. I am also having him start on losartan. Additionally, I am having him maintain his aspirin, MULTIVITAL, clobetasol cream, doxycycline, furosemide, gabapentin, hydrOXYzine, metFORMIN, methotrexate, glucose blood, clorazepate, triamcinolone cream, latanoprost, Vitamin D-3, amLODipine, montelukast, famotidine, EPINEPHrine, folic acid, and HYDROcodone-acetaminophen.  Meds ordered this encounter  Medications  . losartan (COZAAR) 100 MG tablet    Sig: Take 1 tablet (100 mg total) by mouth daily.    Dispense:  90 tablet    Refill:  3  . DISCONTD: HYDROcodone-acetaminophen (NORCO/VICODIN) 5-325 MG tablet    Sig: Take 1 tablet by mouth 3 (three) times daily as needed for moderate pain.    Dispense:  120 tablet    Refill:  0  . DISCONTD: HYDROcodone-acetaminophen (NORCO/VICODIN) 5-325 MG tablet    Sig: Take 1 tablet by mouth every 6 (six) hours as needed for moderate pain.    Dispense:  120 tablet    Refill:  0  . HYDROcodone-acetaminophen  (NORCO/VICODIN) 5-325 MG tablet    Sig: Take 1 tablet by mouth every 6 (six) hours as needed for moderate pain.    Dispense:  120 tablet    Refill:  0    May refill on or after Dec 28 2015    Medications Discontinued During This Encounter  Medication Reason  . meloxicam (MOBIC) 7.5 MG tablet Change in therapy  . lisinopril (PRINIVIL,ZESTRIL) 40 MG tablet   . HYDROcodone-acetaminophen (NORCO/VICODIN) 5-325 MG tablet Reorder  . HYDROcodone-acetaminophen (NORCO/VICODIN) 5-325 MG tablet Reorder  . HYDROcodone-acetaminophen (NORCO/VICODIN) 5-325 MG tablet Reorder    Follow-up: Return in about 3 months (around 02/27/2016) for follow up diabetes.   Crecencio Mc, MD

## 2015-11-28 NOTE — Patient Instructions (Signed)
Your blood pressure is still too high.  I am changing your lisinopril to losartan .   Please continue the amlodipine and start taking the losartan once daily  Check BP in one week and call office to let me know the results

## 2015-11-28 NOTE — Progress Notes (Signed)
Pre-visit discussion using our clinic review tool. No additional management support is needed unless otherwise documented below in the visit note.  

## 2015-11-29 LAB — COMPREHENSIVE METABOLIC PANEL
ALK PHOS: 62 U/L (ref 40–115)
ALT: 19 U/L (ref 9–46)
AST: 17 U/L (ref 10–35)
Albumin: 4.3 g/dL (ref 3.6–5.1)
BUN: 24 mg/dL (ref 7–25)
CO2: 29 mmol/L (ref 20–31)
CREATININE: 1.2 mg/dL — AB (ref 0.70–1.18)
Calcium: 9.3 mg/dL (ref 8.6–10.3)
Chloride: 99 mmol/L (ref 98–110)
GLUCOSE: 65 mg/dL (ref 65–99)
POTASSIUM: 4.5 mmol/L (ref 3.5–5.3)
SODIUM: 139 mmol/L (ref 135–146)
Total Bilirubin: 0.5 mg/dL (ref 0.2–1.2)
Total Protein: 6.4 g/dL (ref 6.1–8.1)

## 2015-11-30 DIAGNOSIS — N182 Chronic kidney disease, stage 2 (mild): Secondary | ICD-10-CM | POA: Insufficient documentation

## 2015-11-30 NOTE — Assessment & Plan Note (Signed)
Improved with suspension of NSAIDs.

## 2015-11-30 NOTE — Assessment & Plan Note (Signed)
Refilling vicodin for use up to 4 times daily pending  Orthopedic follow up in June.

## 2015-11-30 NOTE — Assessment & Plan Note (Addendum)
Not controlled on maximal dose of lisinopril,  Changing to losartan 100 mg daily .  Will consider secondary causes if not controlled on losartan and amlodipine

## 2015-12-01 ENCOUNTER — Encounter: Payer: Self-pay | Admitting: *Deleted

## 2015-12-01 DIAGNOSIS — C8400 Mycosis fungoides, unspecified site: Secondary | ICD-10-CM | POA: Diagnosis not present

## 2015-12-01 DIAGNOSIS — C84A0 Cutaneous T-cell lymphoma, unspecified, unspecified site: Secondary | ICD-10-CM | POA: Diagnosis not present

## 2015-12-01 DIAGNOSIS — Z79899 Other long term (current) drug therapy: Secondary | ICD-10-CM | POA: Diagnosis not present

## 2015-12-05 ENCOUNTER — Telehealth: Payer: Self-pay | Admitting: Internal Medicine

## 2015-12-05 NOTE — Telephone Encounter (Signed)
It hasnt' been a whole week since we changed BP medication make sure he is taking amlodipine AND losartan daily, and check BP once daily for the next week.

## 2015-12-05 NOTE — Telephone Encounter (Signed)
Please advise thanks.

## 2015-12-05 NOTE — Telephone Encounter (Signed)
Pt is calling in his blood pressure readings as per Dr. Derrel Nip. Readings are: 12:15 pm, 160/94, heart rate 70, 1:15 pm 137/87 , heart rate 67, 4:30 pm 152/91, heart rate 67. These readings are from 12/05/15.

## 2015-12-08 DIAGNOSIS — C84A0 Cutaneous T-cell lymphoma, unspecified, unspecified site: Secondary | ICD-10-CM | POA: Diagnosis not present

## 2015-12-08 DIAGNOSIS — Z79899 Other long term (current) drug therapy: Secondary | ICD-10-CM | POA: Diagnosis not present

## 2015-12-08 DIAGNOSIS — C8400 Mycosis fungoides, unspecified site: Secondary | ICD-10-CM | POA: Diagnosis not present

## 2015-12-08 NOTE — Telephone Encounter (Signed)
Spoke with the patient.  He is taking both the amlodipine and the Losartan.  He verbalized that he will check his BP once a day this week and call Friday with the readings.

## 2015-12-09 DIAGNOSIS — C8400 Mycosis fungoides, unspecified site: Secondary | ICD-10-CM | POA: Diagnosis not present

## 2015-12-11 DIAGNOSIS — C8400 Mycosis fungoides, unspecified site: Secondary | ICD-10-CM | POA: Diagnosis not present

## 2015-12-12 ENCOUNTER — Telehealth: Payer: Self-pay | Admitting: Internal Medicine

## 2015-12-12 MED ORDER — HYDROCHLOROTHIAZIDE 25 MG PO TABS: 25.0000 mg | ORAL_TABLET | Freq: Every day | ORAL | Status: DC

## 2015-12-12 NOTE — Telephone Encounter (Signed)
Please advise 

## 2015-12-12 NOTE — Telephone Encounter (Signed)
called in to give BP readings from 12/08/2015 185/94 69 BPM, 12/09/2015 168/104, 12/10/2015 178/112 64 BPM, 12/11/2015 did not record, 12/12/2015 167/97 70 BPM. Pt started Medication before 12/08/2015. Call pt @ [phone]. Thank you!

## 2015-12-12 NOTE — Telephone Encounter (Signed)
Adding hctz 25 mg .  Continue losartan and amlodipine.  REFERRAL IN PLACE FOR RENAL ARTERY DOPPLERS AS DICUSSED AT VISIT TO LOOK FOR CAUSES OF UNCONTROOLED HTN

## 2015-12-12 NOTE — Telephone Encounter (Signed)
Were you waiting for these?  I think these were sent to me by mistake.  Let me know if you need me to do something.

## 2015-12-15 NOTE — Telephone Encounter (Signed)
Patient notified and voiced understanding.

## 2015-12-16 DIAGNOSIS — C8400 Mycosis fungoides, unspecified site: Secondary | ICD-10-CM | POA: Diagnosis not present

## 2015-12-17 ENCOUNTER — Telehealth: Payer: Self-pay | Admitting: Internal Medicine

## 2015-12-17 DIAGNOSIS — I10 Essential (primary) hypertension: Secondary | ICD-10-CM

## 2015-12-17 NOTE — Telephone Encounter (Signed)
Needs referral to vasc surgery renal ARTERY ultrasound.  Ordered.  thanks

## 2015-12-17 NOTE — Telephone Encounter (Signed)
Spoke with patient and example the note in the system about medications and that Dr. Derrel Nip was going to order doppler for pt. No order/referral has been entered.msn

## 2015-12-17 NOTE — Telephone Encounter (Signed)
Does patient need referral or just orders for Venous US.

## 2015-12-17 NOTE — Telephone Encounter (Signed)
Pt called back was not fully awake when you called. Please call him back.   Thank you

## 2015-12-18 DIAGNOSIS — C8400 Mycosis fungoides, unspecified site: Secondary | ICD-10-CM | POA: Diagnosis not present

## 2015-12-19 ENCOUNTER — Telehealth: Payer: Self-pay | Admitting: Internal Medicine

## 2015-12-19 DIAGNOSIS — E669 Obesity, unspecified: Secondary | ICD-10-CM

## 2015-12-19 DIAGNOSIS — I10 Essential (primary) hypertension: Secondary | ICD-10-CM

## 2015-12-19 NOTE — Telephone Encounter (Signed)
Pt called to give his BP readings: 05/08 170/99 Heart rate 76 and 169/88 heart rate 70 05/09 174/105 heart rate 62 05/10 108/105 heart rate 76 05/11 186/105 heart rate 73 05/12 Right arm 139/104 heart rate 73 and left arm 176/108 heart rate 76   Call pt @ [phone]. Thank you!

## 2015-12-22 MED ORDER — HYDRALAZINE HCL 25 MG PO TABS: 25.0000 mg | ORAL_TABLET | Freq: Three times a day (TID) | ORAL | Status: DC

## 2015-12-22 NOTE — Addendum Note (Signed)
Addended by: Crecencio Mc on: 12/22/2015 01:06 PM   Modules accepted: Orders

## 2015-12-22 NOTE — Telephone Encounter (Signed)
See unrouted note re: med dose change and testing needed, because his BP is uncontrolled despiet using 4 medications.  He also  needs to have a sleep study done  to see if untreated sleep apnea is contributing to uncontrolled BP . If he is willing to have a sleep study, let me know

## 2015-12-22 NOTE — Telephone Encounter (Signed)
Thank you please ask patient to increase the hydralazine to 50 mg every 8 houurs.  . Continue amlodipine. hctz and losartan as well.   I am ordering a vascular evaluation of his renal arteries

## 2015-12-22 NOTE — Telephone Encounter (Signed)
Please advise, Blood pressure readings below.  Thanks

## 2015-12-22 NOTE — Addendum Note (Signed)
Addended by: Crecencio Mc on: 12/22/2015 05:39 PM   Modules accepted: Orders

## 2015-12-22 NOTE — Telephone Encounter (Signed)
Reviewed your statements with the patient.  Patient is not currently on hydralazine, did you start him on it? hydralazine to 50 mg every 8 hours? Or was this suppose to be something else.  He takes the HCTZ, the amlodopine and the losartan.  He is okay with the vascular study and the sleep study.    Patient is taking interfron injection 3 times a week at [facility], he wanted to make sure you had that information.

## 2015-12-22 NOTE — Telephone Encounter (Signed)
Opps! My mistake,  He has hydroxyzine listed in chart.  i will ADD hydralazine but starting at 25 mg three times daily

## 2015-12-22 NOTE — Telephone Encounter (Signed)
Spoke with patient and verbalized new orders.  He verbalized understanding. , He said thanks!!!

## 2015-12-23 DIAGNOSIS — C8400 Mycosis fungoides, unspecified site: Secondary | ICD-10-CM | POA: Diagnosis not present

## 2015-12-25 ENCOUNTER — Other Ambulatory Visit: Payer: Self-pay | Admitting: Internal Medicine

## 2015-12-25 DIAGNOSIS — C8400 Mycosis fungoides, unspecified site: Secondary | ICD-10-CM | POA: Diagnosis not present

## 2015-12-29 ENCOUNTER — Telehealth: Payer: Self-pay | Admitting: Internal Medicine

## 2015-12-29 NOTE — Telephone Encounter (Signed)
Can you tell me when his sleep study and vascular referrals are going to be done?  They are showing up as "future" .  The vascular referral si for a renal artery doppler

## 2015-12-29 NOTE — Telephone Encounter (Signed)
Pt is calling in BP results from May 15-21; Mon, 160/186, Tues; 140/88, later 160/90, Wed; 148/86, around 9pm 168/88, Thurs; 160/80, Fri; 156/86, Sat 156/82, Sunday 148/84. Please let Dr. Derrel Nip know these results.

## 2015-12-30 DIAGNOSIS — C8400 Mycosis fungoides, unspecified site: Secondary | ICD-10-CM | POA: Diagnosis not present

## 2015-12-30 NOTE — Telephone Encounter (Signed)
He is scheduled at [facility] on 5/24 for his u/s and his referral for his sleep study was sent to [facility]. I will check with them for status.

## 2015-12-31 DIAGNOSIS — I10 Essential (primary) hypertension: Secondary | ICD-10-CM | POA: Diagnosis not present

## 2016-01-01 DIAGNOSIS — C8400 Mycosis fungoides, unspecified site: Secondary | ICD-10-CM | POA: Diagnosis not present

## 2016-01-06 ENCOUNTER — Telehealth: Payer: Self-pay | Admitting: Internal Medicine

## 2016-01-06 ENCOUNTER — Other Ambulatory Visit: Payer: Self-pay

## 2016-01-06 DIAGNOSIS — Z760 Encounter for issue of repeat prescription: Secondary | ICD-10-CM

## 2016-01-06 DIAGNOSIS — K8020 Calculus of gallbladder without cholecystitis without obstruction: Secondary | ICD-10-CM

## 2016-01-06 DIAGNOSIS — I10 Essential (primary) hypertension: Secondary | ICD-10-CM

## 2016-01-06 DIAGNOSIS — C8400 Mycosis fungoides, unspecified site: Secondary | ICD-10-CM | POA: Diagnosis not present

## 2016-01-06 NOTE — Telephone Encounter (Signed)
Error.  Patient has already contacted the pharmacy for the medication refill.

## 2016-01-06 NOTE — Telephone Encounter (Signed)
Notified patient and he verbalized understanding.  Reports new BP medication is working much better.  Encouraged him to call back with any additional questions or concerns.

## 2016-01-06 NOTE — Telephone Encounter (Signed)
Vascular ultrasound ruled out renal artery stenosis,  So a Sleep  Study has been ordered to rule out sleep apnea as a cause for his persistently elevated blood pressure reading s  Incidental findings of gallstones.  NTD unless he develops RUQ pain

## 2016-01-08 DIAGNOSIS — C8400 Mycosis fungoides, unspecified site: Secondary | ICD-10-CM | POA: Diagnosis not present

## 2016-01-13 DIAGNOSIS — C8400 Mycosis fungoides, unspecified site: Secondary | ICD-10-CM | POA: Diagnosis not present

## 2016-01-14 DIAGNOSIS — E119 Type 2 diabetes mellitus without complications: Secondary | ICD-10-CM | POA: Diagnosis not present

## 2016-01-14 DIAGNOSIS — M1611 Unilateral primary osteoarthritis, right hip: Secondary | ICD-10-CM | POA: Diagnosis not present

## 2016-01-15 DIAGNOSIS — C8400 Mycosis fungoides, unspecified site: Secondary | ICD-10-CM | POA: Diagnosis not present

## 2016-01-16 ENCOUNTER — Telehealth: Payer: Self-pay | Admitting: Internal Medicine

## 2016-01-16 NOTE — Telephone Encounter (Signed)
I explained to patient to try diet and exercise.  Information mailed to home address.

## 2016-01-16 NOTE — Telephone Encounter (Signed)
Pt was suppose to have hip surgery on June 23 but because his ALC is high at 7.7 they will not operate they moved it to Aug.. Pt wants to know what he can do to get it down.. Please advise pt at [phone].

## 2016-01-21 DIAGNOSIS — Z79899 Other long term (current) drug therapy: Secondary | ICD-10-CM | POA: Diagnosis not present

## 2016-01-27 DIAGNOSIS — C8400 Mycosis fungoides, unspecified site: Secondary | ICD-10-CM | POA: Diagnosis not present

## 2016-01-28 ENCOUNTER — Other Ambulatory Visit: Payer: Self-pay | Admitting: Internal Medicine

## 2016-01-28 ENCOUNTER — Other Ambulatory Visit (INDEPENDENT_AMBULATORY_CARE_PROVIDER_SITE_OTHER): Payer: PPO

## 2016-01-28 DIAGNOSIS — E119 Type 2 diabetes mellitus without complications: Secondary | ICD-10-CM

## 2016-01-28 LAB — COMPREHENSIVE METABOLIC PANEL
ALBUMIN: 3.7 g/dL (ref 3.5–5.2)
ALK PHOS: 53 U/L (ref 39–117)
ALT: 26 U/L (ref 0–53)
AST: 21 U/L (ref 0–37)
BUN: 26 mg/dL — ABNORMAL HIGH (ref 6–23)
CHLORIDE: 99 meq/L (ref 96–112)
CO2: 30 mEq/L (ref 19–32)
Calcium: 9.6 mg/dL (ref 8.4–10.5)
Creatinine, Ser: 1.34 mg/dL (ref 0.40–1.50)
GFR: 55.79 mL/min — AB (ref >60.00)
Glucose, Bld: 145 mg/dL — ABNORMAL HIGH (ref 70–99)
POTASSIUM: 3.2 meq/L — AB (ref 3.5–5.1)
SODIUM: 139 meq/L (ref 135–145)
TOTAL PROTEIN: 6 g/dL (ref 6.0–8.3)
Total Bilirubin: 0.7 mg/dL (ref 0.2–1.2)

## 2016-01-28 LAB — HEMOGLOBIN A1C: HEMOGLOBIN A1C: 7.6 % — AB (ref 4.6–6.5)

## 2016-01-29 ENCOUNTER — Other Ambulatory Visit: Payer: Self-pay | Admitting: Internal Medicine

## 2016-01-29 DIAGNOSIS — C8400 Mycosis fungoides, unspecified site: Secondary | ICD-10-CM | POA: Diagnosis not present

## 2016-01-30 ENCOUNTER — Telehealth: Payer: Self-pay | Admitting: Internal Medicine

## 2016-01-30 MED ORDER — POTASSIUM CHLORIDE CRYS ER 20 MEQ PO TBCR: 20.0000 meq | EXTENDED_RELEASE_TABLET | Freq: Every day | ORAL | Status: DC

## 2016-01-30 NOTE — Telephone Encounter (Signed)
Patient called back and stated he was taking Intron A from [place]. One side affect is causing high blood sugars.

## 2016-01-30 NOTE — Telephone Encounter (Signed)
Pt called returning call regarding lab results.   Call pt @ [phone]. Thank you!

## 2016-01-30 NOTE — Telephone Encounter (Signed)
Patient was informed of results.  Patient understood and no questions, comments, or concerns at this time.  

## 2016-01-30 NOTE — Addendum Note (Signed)
Addended by: Crecencio Mc on: 01/30/2016 01:40 PM   Modules accepted: Orders

## 2016-02-03 ENCOUNTER — Telehealth: Payer: Self-pay | Admitting: *Deleted

## 2016-02-03 DIAGNOSIS — C8400 Mycosis fungoides, unspecified site: Secondary | ICD-10-CM | POA: Diagnosis not present

## 2016-02-03 NOTE — Telephone Encounter (Signed)
Scheduled labs and BP check.

## 2016-02-03 NOTE — Telephone Encounter (Signed)
Please advise, they wanted him to call you immediately regarding the 1.43 reading, thanks

## 2016-02-03 NOTE — Telephone Encounter (Signed)
Pt called back.   Michele @ Cancer center at [facility].   Creatinine level is 1.2 and the second one is from [place] 1.43.  Call pt @ [phone]. Thank you!

## 2016-02-03 NOTE — Telephone Encounter (Signed)
Patient has requested to speak to someone about his kidney function. He had his labs drawn at [facility].  Pt contact [phone]

## 2016-02-03 NOTE — Telephone Encounter (Signed)
Left a VM to return my call. thanks

## 2016-02-03 NOTE — Telephone Encounter (Signed)
Spoke with patient.  He had his labs redrawn at [facility] and his Creatinine was elevated he didn;t have the results with him as he is at a doctors appt.  He will call back and give the information to the front staff. thanks

## 2016-02-03 NOTE — Telephone Encounter (Signed)
Have him suspend the hctz and repeAT A bme here in one week,  Needs RN visit for BP check as well

## 2016-02-04 ENCOUNTER — Other Ambulatory Visit: Payer: Self-pay | Admitting: Internal Medicine

## 2016-02-05 DIAGNOSIS — C8400 Mycosis fungoides, unspecified site: Secondary | ICD-10-CM | POA: Diagnosis not present

## 2016-02-06 ENCOUNTER — Telehealth: Payer: Self-pay

## 2016-02-06 ENCOUNTER — Other Ambulatory Visit: Payer: Self-pay | Admitting: Internal Medicine

## 2016-02-06 DIAGNOSIS — I10 Essential (primary) hypertension: Secondary | ICD-10-CM

## 2016-02-06 NOTE — Telephone Encounter (Signed)
bmet ordered 

## 2016-02-06 NOTE — Telephone Encounter (Signed)
Pt coming for blood work on 7/3. No future orders placed. Looks like we are repeating BMET; any other labs needed?

## 2016-02-09 ENCOUNTER — Ambulatory Visit (INDEPENDENT_AMBULATORY_CARE_PROVIDER_SITE_OTHER): Payer: PPO

## 2016-02-09 ENCOUNTER — Other Ambulatory Visit (INDEPENDENT_AMBULATORY_CARE_PROVIDER_SITE_OTHER): Payer: PPO

## 2016-02-09 ENCOUNTER — Other Ambulatory Visit: Payer: Self-pay

## 2016-02-09 VITALS — BP 162/72 | HR 78 | Resp 18

## 2016-02-09 DIAGNOSIS — I10 Essential (primary) hypertension: Secondary | ICD-10-CM | POA: Diagnosis not present

## 2016-02-09 NOTE — Progress Notes (Signed)
Patient came in for a BP check.  Checked BP and discussed importance of taking medications at the proper times daily and asked him to record readings at home and call them in for Provider to review.    Patient had labs drawn today, had suspended the HCTZ per request one week ago.  Patient is having increased pain as he is trying to increase his activity level, has been using a stationery bike , riding for 1min-60mins.  Pain was a 9/10 during nurse visit.  Please advise.

## 2016-02-09 NOTE — Telephone Encounter (Signed)
Please advise refill, he came in for a nurse visit today and requested only if you are able to, thanks

## 2016-02-10 ENCOUNTER — Other Ambulatory Visit: Payer: Self-pay | Admitting: Internal Medicine

## 2016-02-10 DIAGNOSIS — R944 Abnormal results of kidney function studies: Secondary | ICD-10-CM

## 2016-02-10 DIAGNOSIS — E1121 Type 2 diabetes mellitus with diabetic nephropathy: Secondary | ICD-10-CM

## 2016-02-10 LAB — BASIC METABOLIC PANEL WITH GFR
BUN: 18 mg/dL (ref 7–25)
CALCIUM: 8.9 mg/dL (ref 8.6–10.3)
CHLORIDE: 106 mmol/L (ref 98–110)
CO2: 28 mmol/L (ref 20–31)
CREATININE: 1.27 mg/dL — AB (ref 0.70–1.18)
GFR, Est African American: 65 mL/min (ref >=60)
GFR, Est Non African American: 56 mL/min — ABNORMAL LOW (ref >=60)
GLUCOSE: 109 mg/dL — AB (ref 65–99)
Potassium: 4.9 mmol/L (ref 3.5–5.3)
Sodium: 142 mmol/L (ref 135–146)

## 2016-02-10 MED ORDER — HYDROCODONE-ACETAMINOPHEN 5-325 MG PO TABS: 1.0000 | ORAL_TABLET | Freq: Four times a day (QID) | ORAL | Status: DC | PRN

## 2016-02-10 NOTE — Progress Notes (Signed)
BP is poorly controlled despite maximal doses of 3 medications and negative evaluation for renal artery stenosis,  the issue appears to be due to noncompliance.  He will be asked to return for a BP check in one week after strict adherence to regimen.   Requests for escalating doses of narcotics will require an office visit with the physician  Lab Results  Component Value Date   CREATININE 1.27* 02/09/2016   Lab Results  Component Value Date   CREATININE 1.27* 02/09/2016   CREATININE 1.34 01/28/2016   CREATININE 1.20* 11/28/2015    His kidney function has not changed significantly with suspension of hctz. But he should continue to suspend the medication and a referral to nephrology is recommended and initiated.

## 2016-02-11 DIAGNOSIS — C8400 Mycosis fungoides, unspecified site: Secondary | ICD-10-CM | POA: Diagnosis not present

## 2016-02-12 ENCOUNTER — Telehealth: Payer: Self-pay | Admitting: *Deleted

## 2016-02-12 NOTE — Telephone Encounter (Signed)
Patient wanted to [facility] his blood pressure readings  02/10/16 Tuesday 140/64 afternoon 02/11/16 Wednesday 156/86 night 02/12/16 Thursday 146/80 afternoon

## 2016-02-12 NOTE — Progress Notes (Signed)
Attempted to reach patient, left a detailed VM that I need readings on Friday and that next week we need to schedule a BP recheck in the Office.

## 2016-02-13 NOTE — Telephone Encounter (Signed)
Returned call to patient to see if pulse rate available for readings but patient did not take pulse. Advised patient in future we need pulse with BP.

## 2016-02-13 NOTE — Telephone Encounter (Signed)
Much better 

## 2016-02-13 NOTE — Telephone Encounter (Signed)
Patient notified

## 2016-02-17 DIAGNOSIS — C8400 Mycosis fungoides, unspecified site: Secondary | ICD-10-CM | POA: Diagnosis not present

## 2016-02-19 ENCOUNTER — Ambulatory Visit (INDEPENDENT_AMBULATORY_CARE_PROVIDER_SITE_OTHER): Payer: PPO

## 2016-02-19 VITALS — BP 148/80 | HR 80

## 2016-02-19 DIAGNOSIS — I10 Essential (primary) hypertension: Secondary | ICD-10-CM

## 2016-02-19 DIAGNOSIS — C8400 Mycosis fungoides, unspecified site: Secondary | ICD-10-CM | POA: Diagnosis not present

## 2016-02-19 NOTE — Progress Notes (Signed)
Care was provided under my supervision. Patient needs follow up with PCP.  Thersa Salt DO

## 2016-02-19 NOTE — Progress Notes (Signed)
Patient presented for blood pressure check, per Dr. Lupita Dawn order.  He states he has been compliant with scheduled blood pressure medication for the last week.  Wife is recording his readings in the morning.  Taking Losartan, Amlodipine, Hydralazine at 11:00 am.  Taking second and third dose of hydralazine in the evening.  Verbal teach back as how it should be taken.  Chronic pain in R hip at 0 when sitting, 7-8 when standing/walking. Encouraged to keep upcoming follow up appointment with PCP in 1 week. Today's reading: Left arm, 148/80, P80, SpO2 95%  Home record: 7/7 162/82, P65 7/8 166/82, P72 7/9 166/88, P60 7/10 164/84, P67 7/11 148/86, P72 7/12 152/76, P65

## 2016-02-20 NOTE — Progress Notes (Signed)
Office readings are better.  No changes to regmen currently.  Keep appointment   Regards,   Deborra Medina, MD

## 2016-02-24 DIAGNOSIS — C8400 Mycosis fungoides, unspecified site: Secondary | ICD-10-CM | POA: Diagnosis not present

## 2016-02-25 ENCOUNTER — Other Ambulatory Visit: Payer: Self-pay | Admitting: Internal Medicine

## 2016-02-25 DIAGNOSIS — Z79899 Other long term (current) drug therapy: Secondary | ICD-10-CM | POA: Diagnosis not present

## 2016-02-26 DIAGNOSIS — C8400 Mycosis fungoides, unspecified site: Secondary | ICD-10-CM | POA: Diagnosis not present

## 2016-02-27 ENCOUNTER — Other Ambulatory Visit: Payer: Self-pay

## 2016-02-27 ENCOUNTER — Ambulatory Visit (INDEPENDENT_AMBULATORY_CARE_PROVIDER_SITE_OTHER): Payer: PPO | Admitting: Internal Medicine

## 2016-02-27 ENCOUNTER — Encounter: Payer: Self-pay | Admitting: Internal Medicine

## 2016-02-27 DIAGNOSIS — E1121 Type 2 diabetes mellitus with diabetic nephropathy: Secondary | ICD-10-CM

## 2016-02-27 DIAGNOSIS — I10 Essential (primary) hypertension: Secondary | ICD-10-CM

## 2016-02-27 MED ORDER — HYDRALAZINE HCL 50 MG PO TABS: 50.0000 mg | ORAL_TABLET | Freq: Three times a day (TID) | ORAL | Status: DC

## 2016-02-27 MED ORDER — HYDROCODONE-ACETAMINOPHEN 5-325 MG PO TABS: 1.0000 | ORAL_TABLET | Freq: Four times a day (QID) | ORAL | Status: DC | PRN

## 2016-02-27 NOTE — Patient Instructions (Addendum)
I am increasing your hydralazine dose  to 50 mg three times daily .  I have sent the new prescription to [facility].  You can take 2 of your old 25 mg tablets  THREE TIMES DAILY until you finish them up   Stop checking the morning sugars since they are all excellent   Start checking sugars 2 hours after different meals (twice daily)   Bring back your glucose meter with your log for a RN visit in 2 weeks

## 2016-02-27 NOTE — Progress Notes (Signed)
Subjective:  Patient ID: Daniel Kline, male    DOB: 10/27/1945  Age: 70 y.o. MRN: [id number]  CC: Diagnoses of Diabetes mellitus with nephropathy ([place]) and Essential hypertension were pertinent to this visit.  HPI [name] presents for follow up on uncontrolled hypertension,  DM type 2 with CKD.   Patient now prescibed max doses of amlodipine, losartan.  Tolerating addition of hydralazine . States that the  hydralazine is helping to lower the diastolic BP to < 123XX123 but still 160/85 and pulse is  65    Knee surgery was postponed by [facility] due to A1c > 7.0 He has been checking BS twice daily,  Fasting and 2 hour post dinner.   Lab Results  Component Value Date   HGBA1C 7.6 (H) 01/28/2016   Pain in right leg is still 8/10  Using vicodin 2-3 times daily . Not using NSAIDs. Stomach has been upset   BS reviewed 90% are at goal  Morning 101 fasting   Night 109  Morning 110     Night 114 Morning 95     Night    Outpatient Medications Prior to Visit  Medication Sig Dispense Refill  . amLODipine (NORVASC) 10 MG tablet TAKE ONE TABLET BY MOUTH EVERY DAY 90 tablet 3  . aspirin (ASPIR-81) 81 MG EC tablet Take 81 mg by mouth daily. Reported on 10/28/2015    . clobetasol cream (TEMOVATE) [id number] % Apply 1 application topically 2 (two) times daily.     . clorazepate (TRANXENE) 7.5 MG tablet TAKE ONE TABLET BY MOUTH 3 TIMES DAILY (Patient taking differently: TAKE ONE TABLET BY MOUTH 3 TIMES DAILY AS NEEDED) 90 tablet 1  . doxycycline (VIBRA-TABS) 100 MG tablet Take 1 tablet by mouth 2 (two) times daily.    Marland Kitchen EPINEPHrine (EPIPEN 2-PAK) 0.3 mg/0.3 mL IJ SOAJ injection Inject 0.3 mLs (0.3 mg total) into the muscle once. 1 Device 1  . famotidine (PEPCID) 20 MG tablet Take 1 tablet (20 mg total) by mouth 2 (two) times daily. 60 tablet 5  . folic acid (FOLVITE) 1 MG tablet TAKE ONE TABLET BY MOUTH EVERY DAY EXCEPT ON METHOTREXATE DAYS 90 tablet 1  . furosemide (LASIX) 20 MG tablet Take  20 mg by mouth daily.     Marland Kitchen gabapentin (NEURONTIN) 300 MG capsule TAKE 1 CAPSULE 3 TIMES DAILY 90 capsule 3  . glucose blood (ONE TOUCH ULTRA TEST) test strip     . hydrOXYzine (ATARAX/VISTARIL) 25 MG tablet Take 25 mg by mouth every 8 (eight) hours as needed.     . INTRON A 6000000 UNIT/ML injection     . latanoprost (XALATAN) 0.005 % ophthalmic solution Place 1 drop into the right eye at bedtime.    Marland Kitchen losartan (COZAAR) 100 MG tablet Take 1 tablet (100 mg total) by mouth daily. 90 tablet 3  . metFORMIN (GLUCOPHAGE) 500 MG tablet TAKE ONE TABLET TWICE DAILY 60 tablet 5  . methotrexate (RHEUMATREX) 2.5 MG tablet Take 2.5 mg by mouth once a week. 10 tablets on Wednesday    . montelukast (SINGULAIR) 10 MG tablet Take 1 tablet (10 mg total) by mouth at bedtime. 30 tablet 3  . Multiple Vitamins-Minerals (MULTIVITAL) tablet Take 1 tablet by mouth daily.      . potassium chloride SA (K-DUR,KLOR-CON) 20 MEQ tablet Take 1 tablet (20 mEq total) by mouth daily. 30 tablet 3  . hydrALAZINE (APRESOLINE) 25 MG tablet TAKE 1 TABLET BY MOUTH 3 TIMES DAILY (Patient  taking differently: TAKE 1 TABLET BY MOUTH 3 TIMES DAILY AS NEEDED) 90 tablet 0  . HYDROcodone-acetaminophen (NORCO/VICODIN) 5-325 MG tablet Take 1 tablet by mouth every 6 (six) hours as needed for moderate pain. 120 tablet 0  . Cholecalciferol (VITAMIN D-3) 1000 units CAPS Take 2 capsules by mouth daily. Reported on 02/27/2016     No facility-administered medications prior to visit.     Review of Systems;  Patient denies headache, fevers, malaise, unintentional weight loss, skin rash, eye pain, sinus congestion and sinus pain, sore throat, dysphagia,  hemoptysis , cough, dyspnea, wheezing, chest pain, palpitations, orthopnea, edema, abdominal pain, nausea, melena, diarrhea, constipation, flank pain, dysuria, hematuria, urinary  Frequency, nocturia, numbness, tingling, seizures,  Focal weakness, Loss of consciousness,  Tremor, insomnia, depression,  anxiety, and suicidal ideation.      Objective:  BP (!) 150/82   Pulse 80   Temp 97.8 F (36.6 C) (Oral)   Resp 16   Wt 233 lb (105.7 kg)   BMI 36.49 kg/m   BP Readings from Last 3 Encounters:  02/27/16 (!) 150/82  02/19/16 (!) 148/80  02/09/16 (!) 162/72    Wt Readings from Last 3 Encounters:  02/27/16 233 lb (105.7 kg)  11/28/15 236 lb 4 oz (107.2 kg)  10/28/15 239 lb 12 oz (108.7 kg)    General appearance: alert, cooperative and appears stated age Ears: normal TM's and external ear canals both ears Throat: lips, mucosa, and tongue normal; teeth and gums normal Neck: no adenopathy, no carotid bruit, supple, symmetrical, trachea midline and thyroid not enlarged, symmetric, no tenderness/mass/nodules Back: symmetric, no curvature. ROM normal. No CVA tenderness. Lungs: clear to auscultation bilaterally Heart: regular rate and rhythm, S1, S2 normal, no murmur, click, rub or gallop Abdomen: soft, non-tender; bowel sounds normal; no masses,  no organomegaly Pulses: 2+ and symmetric Skin: Skin color, texture, turgor normal. No rashes or lesions Lymph nodes: Cervical, supraclavicular, and axillary nodes normal.  Lab Results  Component Value Date   HGBA1C 7.6 (H) 01/28/2016   HGBA1C 7.1 (H) 09/24/2015    Lab Results  Component Value Date   CREATININE 1.27 (H) 02/09/2016   CREATININE 1.34 01/28/2016   CREATININE 1.20 (H) 11/28/2015    Lab Results  Component Value Date   WBC 7.8 11/28/2015   HGB 14.4 11/28/2015   HCT 43.6 11/28/2015   PLT 242 11/28/2015   GLUCOSE 109 (H) 02/09/2016   LDLDIRECT 132.0 09/24/2015   ALT 26 01/28/2016   AST 21 01/28/2016   NA 142 02/09/2016   K 4.9 02/09/2016   CL 106 02/09/2016   CREATININE 1.27 (H) 02/09/2016   BUN 18 02/09/2016   CO2 28 02/09/2016   PSA 1.60 09/24/2015   HGBA1C 7.6 (H) 01/28/2016   MICROALBUR 42.1 (H) 09/24/2015    No results found.  Assessment & Plan:   Problem List Items Addressed This Visit     Essential hypertension    Not at goal yet on maximal doses of amlodipine and losartan,  Increasing hydralazine to 50 mg tid. Return in 2 weeks for RN visit to check BP      Relevant Medications   hydrALAZINE (APRESOLINE) 50 MG tablet   Diabetes mellitus with nephropathy ([place])    His control appears to be improving .  Return in 2 week with log of post prandial sugars and glucometer.        Other Visit Diagnoses   None.     I have changed Mr. Radich's hydrALAZINE. I  am also having him maintain his aspirin, MULTIVITAL, clobetasol cream, doxycycline, furosemide, hydrOXYzine, methotrexate, glucose blood, latanoprost, Vitamin D-3, amLODipine, montelukast, famotidine, EPINEPHrine, folic acid, losartan, clorazepate, potassium chloride SA, metFORMIN, gabapentin, INTRON A, and HYDROcodone-acetaminophen.  Meds ordered this encounter  Medications  . HYDROcodone-acetaminophen (NORCO/VICODIN) 5-325 MG tablet    Sig: Take 1 tablet by mouth every 6 (six) hours as needed for moderate pain.    Dispense:  120 tablet    Refill:  0    May refill on or after March 12 2016  . hydrALAZINE (APRESOLINE) 50 MG tablet    Sig: Take 1 tablet (50 mg total) by mouth 3 (three) times daily.    Dispense:  120 tablet    Refill:  0    FOR FUTURE REFILLS    Medications Discontinued During This Encounter  Medication Reason  . HYDROcodone-acetaminophen (NORCO/VICODIN) 5-325 MG tablet Reorder  . hydrALAZINE (APRESOLINE) 25 MG tablet Reorder   A total of 25 minutes of face to face time was spent with patient more than half of which was spent in counselling about the above mentioned conditions  and coordination of care  Follow-up: Return in about 2 weeks (around 03/12/2016) for RN VISIT FOR GLUCOMETER AND BP  CHECK .   Crecencio Mc, MD

## 2016-02-29 NOTE — Assessment & Plan Note (Signed)
His control appears to be improving .  Return in 2 week with log of post prandial sugars and glucometer.

## 2016-02-29 NOTE — Assessment & Plan Note (Addendum)
Not at goal yet on maximal doses of amlodipine and losartan,  Increasing hydralazine to 50 mg tid. Return in 2 weeks for RN visit to check BP

## 2016-03-02 DIAGNOSIS — C8400 Mycosis fungoides, unspecified site: Secondary | ICD-10-CM | POA: Diagnosis not present

## 2016-03-04 DIAGNOSIS — C8400 Mycosis fungoides, unspecified site: Secondary | ICD-10-CM | POA: Diagnosis not present

## 2016-03-09 ENCOUNTER — Other Ambulatory Visit: Payer: Self-pay | Admitting: Internal Medicine

## 2016-03-09 DIAGNOSIS — C8400 Mycosis fungoides, unspecified site: Secondary | ICD-10-CM | POA: Diagnosis not present

## 2016-03-11 DIAGNOSIS — C8400 Mycosis fungoides, unspecified site: Secondary | ICD-10-CM | POA: Diagnosis not present

## 2016-03-12 DIAGNOSIS — N2889 Other specified disorders of kidney and ureter: Secondary | ICD-10-CM | POA: Diagnosis not present

## 2016-03-12 DIAGNOSIS — R809 Proteinuria, unspecified: Secondary | ICD-10-CM | POA: Diagnosis not present

## 2016-03-12 DIAGNOSIS — E1129 Type 2 diabetes mellitus with other diabetic kidney complication: Secondary | ICD-10-CM | POA: Diagnosis not present

## 2016-03-12 DIAGNOSIS — I10 Essential (primary) hypertension: Secondary | ICD-10-CM | POA: Diagnosis not present

## 2016-03-15 ENCOUNTER — Other Ambulatory Visit: Payer: Self-pay | Admitting: Nephrology

## 2016-03-15 DIAGNOSIS — N2889 Other specified disorders of kidney and ureter: Secondary | ICD-10-CM

## 2016-03-16 ENCOUNTER — Ambulatory Visit (INDEPENDENT_AMBULATORY_CARE_PROVIDER_SITE_OTHER): Payer: PPO | Admitting: Surgical

## 2016-03-16 ENCOUNTER — Other Ambulatory Visit: Payer: Self-pay

## 2016-03-16 DIAGNOSIS — I10 Essential (primary) hypertension: Secondary | ICD-10-CM | POA: Diagnosis not present

## 2016-03-16 DIAGNOSIS — C8400 Mycosis fungoides, unspecified site: Secondary | ICD-10-CM | POA: Diagnosis not present

## 2016-03-16 MED ORDER — CLORAZEPATE DIPOTASSIUM 7.5 MG PO TABS: ORAL_TABLET | ORAL | 1 refills | Status: DC

## 2016-03-16 NOTE — Telephone Encounter (Signed)
Please advise, this is a new fill for you, thanks

## 2016-03-16 NOTE — Progress Notes (Signed)
Patient came in today for blood pressure check. Blood pressure 174/106 in left arm. Not able to get blood pressure in right arm. Patient pulse today was 88. He stated that he has taken blood pressure medication like prescribed. He also wanted me to check his blood glucose machine against ours. He got a reading of 141 on his machine and I got a reading of 127 on our machine. He also gave me his BP readings for Dr. Derrel Nip that I have put on her desk.

## 2016-03-16 NOTE — Telephone Encounter (Signed)
Refilled

## 2016-03-17 ENCOUNTER — Ambulatory Visit
Admission: RE | Admit: 2016-03-17 | Discharge: 2016-03-17 | Disposition: A | Discharge status: Home / Self Care | Payer: PPO | Source: Ambulatory Visit | Attending: Nephrology | Admitting: Nephrology

## 2016-03-17 DIAGNOSIS — N200 Calculus of kidney: Secondary | ICD-10-CM | POA: Diagnosis not present

## 2016-03-17 DIAGNOSIS — N2889 Other specified disorders of kidney and ureter: Secondary | ICD-10-CM | POA: Diagnosis not present

## 2016-03-18 DIAGNOSIS — C8400 Mycosis fungoides, unspecified site: Secondary | ICD-10-CM | POA: Diagnosis not present

## 2016-03-19 ENCOUNTER — Telehealth: Payer: Self-pay | Admitting: Internal Medicine

## 2016-03-19 ENCOUNTER — Other Ambulatory Visit: Payer: Self-pay

## 2016-03-19 MED ORDER — HYDRALAZINE HCL 50 MG PO TABS: 75.0000 mg | ORAL_TABLET | Freq: Three times a day (TID) | ORAL | 0 refills | Status: DC

## 2016-03-19 NOTE — Telephone Encounter (Signed)
Requesting refill, last filled on 02/27/2016 #120. thanks

## 2016-03-19 NOTE — Telephone Encounter (Signed)
Left a VM to return my call, thanks 

## 2016-03-19 NOTE — Telephone Encounter (Signed)
I have reviewed his Blood Sugar  log.  His sugars look pretty good,  No changes needed to medications,  But  Please ask him to start checking 1) fasting and 2) 2 hrs post breakfast instead of post lunch and post dinner.   His blood pressure is still elevated .   increase the hydralazine dose  To 75 mg three times daily and continue daily losartan and , amlodipine

## 2016-03-19 NOTE — Telephone Encounter (Signed)
Spoke with patient and reviewed note with him.  Sent a refill for the Hydralazine to the pharmacy.  Reviewed changes in BS schedule.

## 2016-03-21 NOTE — Telephone Encounter (Signed)
Are you sure?  He was given an rx for #120 on July 21st that said "for refill on August 4th"

## 2016-03-22 ENCOUNTER — Other Ambulatory Visit: Payer: Self-pay

## 2016-03-22 ENCOUNTER — Telehealth: Payer: Self-pay

## 2016-03-22 MED ORDER — HYDROCODONE-ACETAMINOPHEN 5-325 MG PO TABS: 1.0000 | ORAL_TABLET | Freq: Four times a day (QID) | ORAL | 0 refills | Status: DC | PRN

## 2016-03-22 NOTE — Telephone Encounter (Signed)
Patient is out of Norco, last fill was 02/11/16, he did not get RX for Norco on LOV 02/27/16. Per pharmacist patient filled this RX last time 02/11/16. RX approved and left up front for patient for pick up. Azalee Course, RMA.

## 2016-03-22 NOTE — Telephone Encounter (Signed)
Patient wanted to make sure you received nephrologist notes from his last visit. He does not have future appointment with them, he wanted to check if there was further work up he needed to follow or anything else in regards to his [place], RMA

## 2016-03-22 NOTE — Telephone Encounter (Signed)
Patient stated that he was out of this medication.  He stated that he was to start a new medication , however he was not sure of the name.

## 2016-03-22 NOTE — Telephone Encounter (Signed)
Lmtcb, received request from pharmacy for [place] refill. Last fill was 02/27/16 per our system. Is he out?-Alonda Weaber, RMA

## 2016-03-23 DIAGNOSIS — C8400 Mycosis fungoides, unspecified site: Secondary | ICD-10-CM | POA: Diagnosis not present

## 2016-03-24 DIAGNOSIS — E119 Type 2 diabetes mellitus without complications: Secondary | ICD-10-CM | POA: Diagnosis not present

## 2016-03-24 DIAGNOSIS — M1611 Unilateral primary osteoarthritis, right hip: Secondary | ICD-10-CM | POA: Diagnosis not present

## 2016-03-24 DIAGNOSIS — M1711 Unilateral primary osteoarthritis, right knee: Secondary | ICD-10-CM | POA: Diagnosis not present

## 2016-03-25 DIAGNOSIS — C8400 Mycosis fungoides, unspecified site: Secondary | ICD-10-CM | POA: Diagnosis not present

## 2016-03-25 NOTE — Progress Notes (Signed)
Care was provided under my supervision. Needs close PCP follow up.  Thersa Salt DO

## 2016-03-29 DIAGNOSIS — C84A0 Cutaneous T-cell lymphoma, unspecified, unspecified site: Secondary | ICD-10-CM | POA: Diagnosis not present

## 2016-03-29 DIAGNOSIS — Z79899 Other long term (current) drug therapy: Secondary | ICD-10-CM | POA: Diagnosis not present

## 2016-03-30 DIAGNOSIS — C8400 Mycosis fungoides, unspecified site: Secondary | ICD-10-CM | POA: Diagnosis not present

## 2016-04-01 DIAGNOSIS — Z79899 Other long term (current) drug therapy: Secondary | ICD-10-CM | POA: Diagnosis not present

## 2016-04-01 DIAGNOSIS — C8400 Mycosis fungoides, unspecified site: Secondary | ICD-10-CM | POA: Diagnosis not present

## 2016-04-06 DIAGNOSIS — C8400 Mycosis fungoides, unspecified site: Secondary | ICD-10-CM | POA: Diagnosis not present

## 2016-04-07 DIAGNOSIS — E119 Type 2 diabetes mellitus without complications: Secondary | ICD-10-CM | POA: Diagnosis not present

## 2016-04-07 DIAGNOSIS — M1611 Unilateral primary osteoarthritis, right hip: Secondary | ICD-10-CM | POA: Diagnosis not present

## 2016-04-08 DIAGNOSIS — C8400 Mycosis fungoides, unspecified site: Secondary | ICD-10-CM | POA: Diagnosis not present

## 2016-04-09 HISTORY — PX: TOTAL HIP ARTHROPLASTY: SHX124

## 2016-04-13 DIAGNOSIS — C8400 Mycosis fungoides, unspecified site: Secondary | ICD-10-CM | POA: Diagnosis not present

## 2016-04-15 DIAGNOSIS — M1611 Unilateral primary osteoarthritis, right hip: Secondary | ICD-10-CM | POA: Diagnosis not present

## 2016-04-15 DIAGNOSIS — C8400 Mycosis fungoides, unspecified site: Secondary | ICD-10-CM | POA: Diagnosis not present

## 2016-04-20 DIAGNOSIS — C8400 Mycosis fungoides, unspecified site: Secondary | ICD-10-CM | POA: Diagnosis not present

## 2016-04-21 ENCOUNTER — Other Ambulatory Visit: Payer: Self-pay | Admitting: Internal Medicine

## 2016-04-21 DIAGNOSIS — Z8614 Personal history of Methicillin resistant Staphylococcus aureus infection: Secondary | ICD-10-CM | POA: Diagnosis not present

## 2016-04-21 DIAGNOSIS — I10 Essential (primary) hypertension: Secondary | ICD-10-CM | POA: Diagnosis not present

## 2016-04-21 DIAGNOSIS — C4490 Unspecified malignant neoplasm of skin, unspecified: Secondary | ICD-10-CM | POA: Insufficient documentation

## 2016-04-21 DIAGNOSIS — Z01818 Encounter for other preprocedural examination: Secondary | ICD-10-CM | POA: Diagnosis not present

## 2016-04-21 DIAGNOSIS — M1612 Unilateral primary osteoarthritis, left hip: Secondary | ICD-10-CM | POA: Diagnosis not present

## 2016-04-21 DIAGNOSIS — K219 Gastro-esophageal reflux disease without esophagitis: Secondary | ICD-10-CM | POA: Insufficient documentation

## 2016-04-21 DIAGNOSIS — Z7984 Long term (current) use of oral hypoglycemic drugs: Secondary | ICD-10-CM | POA: Diagnosis not present

## 2016-04-21 DIAGNOSIS — E119 Type 2 diabetes mellitus without complications: Secondary | ICD-10-CM | POA: Diagnosis not present

## 2016-04-21 DIAGNOSIS — R112 Nausea with vomiting, unspecified: Secondary | ICD-10-CM | POA: Insufficient documentation

## 2016-04-21 DIAGNOSIS — Z85828 Personal history of other malignant neoplasm of skin: Secondary | ICD-10-CM | POA: Diagnosis not present

## 2016-04-21 MED ORDER — HYDRALAZINE HCL 50 MG PO TABS: 75.0000 mg | ORAL_TABLET | Freq: Three times a day (TID) | ORAL | 2 refills | Status: DC

## 2016-04-22 ENCOUNTER — Telehealth: Payer: Self-pay | Admitting: *Deleted

## 2016-04-22 DIAGNOSIS — C8400 Mycosis fungoides, unspecified site: Secondary | ICD-10-CM | POA: Diagnosis not present

## 2016-04-22 NOTE — Telephone Encounter (Signed)
Daniel Kline has requested pt last EKG  Fax [phone] Attn: Mel Almond

## 2016-04-22 NOTE — Telephone Encounter (Signed)
Faxed to number provided, thanks

## 2016-04-26 DIAGNOSIS — G8918 Other acute postprocedural pain: Secondary | ICD-10-CM | POA: Diagnosis not present

## 2016-04-26 DIAGNOSIS — Z471 Aftercare following joint replacement surgery: Secondary | ICD-10-CM | POA: Diagnosis not present

## 2016-04-26 DIAGNOSIS — I10 Essential (primary) hypertension: Secondary | ICD-10-CM | POA: Diagnosis not present

## 2016-04-26 DIAGNOSIS — Z79899 Other long term (current) drug therapy: Secondary | ICD-10-CM | POA: Diagnosis not present

## 2016-04-26 DIAGNOSIS — Z7982 Long term (current) use of aspirin: Secondary | ICD-10-CM | POA: Diagnosis not present

## 2016-04-26 DIAGNOSIS — Z888 Allergy status to other drugs, medicaments and biological substances status: Secondary | ICD-10-CM | POA: Diagnosis not present

## 2016-04-26 DIAGNOSIS — M1611 Unilateral primary osteoarthritis, right hip: Secondary | ICD-10-CM | POA: Diagnosis not present

## 2016-04-26 DIAGNOSIS — C4490 Unspecified malignant neoplasm of skin, unspecified: Secondary | ICD-10-CM | POA: Diagnosis not present

## 2016-04-26 DIAGNOSIS — Z96641 Presence of right artificial hip joint: Secondary | ICD-10-CM | POA: Diagnosis not present

## 2016-04-26 DIAGNOSIS — D62 Acute posthemorrhagic anemia: Secondary | ICD-10-CM | POA: Diagnosis not present

## 2016-04-26 DIAGNOSIS — G8929 Other chronic pain: Secondary | ICD-10-CM | POA: Diagnosis not present

## 2016-04-26 DIAGNOSIS — Z885 Allergy status to narcotic agent status: Secondary | ICD-10-CM | POA: Diagnosis not present

## 2016-04-26 DIAGNOSIS — Z7984 Long term (current) use of oral hypoglycemic drugs: Secondary | ICD-10-CM | POA: Diagnosis not present

## 2016-04-26 DIAGNOSIS — K219 Gastro-esophageal reflux disease without esophagitis: Secondary | ICD-10-CM | POA: Diagnosis not present

## 2016-04-26 DIAGNOSIS — E119 Type 2 diabetes mellitus without complications: Secondary | ICD-10-CM | POA: Diagnosis not present

## 2016-04-27 DIAGNOSIS — M1611 Unilateral primary osteoarthritis, right hip: Secondary | ICD-10-CM | POA: Diagnosis not present

## 2016-04-29 DIAGNOSIS — Z7984 Long term (current) use of oral hypoglycemic drugs: Secondary | ICD-10-CM | POA: Diagnosis not present

## 2016-04-29 DIAGNOSIS — Z471 Aftercare following joint replacement surgery: Secondary | ICD-10-CM | POA: Diagnosis not present

## 2016-04-29 DIAGNOSIS — Z7901 Long term (current) use of anticoagulants: Secondary | ICD-10-CM | POA: Diagnosis not present

## 2016-04-29 DIAGNOSIS — Z96641 Presence of right artificial hip joint: Secondary | ICD-10-CM | POA: Diagnosis not present

## 2016-04-29 DIAGNOSIS — E119 Type 2 diabetes mellitus without complications: Secondary | ICD-10-CM | POA: Diagnosis not present

## 2016-05-03 DIAGNOSIS — Z7901 Long term (current) use of anticoagulants: Secondary | ICD-10-CM | POA: Diagnosis not present

## 2016-05-03 DIAGNOSIS — Z471 Aftercare following joint replacement surgery: Secondary | ICD-10-CM | POA: Diagnosis not present

## 2016-05-03 DIAGNOSIS — Z7984 Long term (current) use of oral hypoglycemic drugs: Secondary | ICD-10-CM | POA: Diagnosis not present

## 2016-05-03 DIAGNOSIS — Z96641 Presence of right artificial hip joint: Secondary | ICD-10-CM | POA: Diagnosis not present

## 2016-05-03 DIAGNOSIS — E119 Type 2 diabetes mellitus without complications: Secondary | ICD-10-CM | POA: Diagnosis not present

## 2016-05-04 ENCOUNTER — Other Ambulatory Visit: Payer: Self-pay | Admitting: Internal Medicine

## 2016-05-07 DIAGNOSIS — Z7984 Long term (current) use of oral hypoglycemic drugs: Secondary | ICD-10-CM | POA: Diagnosis not present

## 2016-05-07 DIAGNOSIS — Z471 Aftercare following joint replacement surgery: Secondary | ICD-10-CM | POA: Diagnosis not present

## 2016-05-07 DIAGNOSIS — Z7901 Long term (current) use of anticoagulants: Secondary | ICD-10-CM | POA: Diagnosis not present

## 2016-05-07 DIAGNOSIS — Z96641 Presence of right artificial hip joint: Secondary | ICD-10-CM | POA: Diagnosis not present

## 2016-05-07 DIAGNOSIS — E119 Type 2 diabetes mellitus without complications: Secondary | ICD-10-CM | POA: Diagnosis not present

## 2016-05-10 ENCOUNTER — Other Ambulatory Visit: Payer: Self-pay | Admitting: Internal Medicine

## 2016-05-11 DIAGNOSIS — Z96641 Presence of right artificial hip joint: Secondary | ICD-10-CM | POA: Diagnosis not present

## 2016-05-11 DIAGNOSIS — E119 Type 2 diabetes mellitus without complications: Secondary | ICD-10-CM | POA: Diagnosis not present

## 2016-05-11 DIAGNOSIS — Z7984 Long term (current) use of oral hypoglycemic drugs: Secondary | ICD-10-CM | POA: Diagnosis not present

## 2016-05-11 DIAGNOSIS — Z7901 Long term (current) use of anticoagulants: Secondary | ICD-10-CM | POA: Diagnosis not present

## 2016-05-11 DIAGNOSIS — Z471 Aftercare following joint replacement surgery: Secondary | ICD-10-CM | POA: Diagnosis not present

## 2016-05-12 DIAGNOSIS — Z4802 Encounter for removal of sutures: Secondary | ICD-10-CM | POA: Diagnosis not present

## 2016-05-13 DIAGNOSIS — Z7901 Long term (current) use of anticoagulants: Secondary | ICD-10-CM | POA: Diagnosis not present

## 2016-05-13 DIAGNOSIS — Z471 Aftercare following joint replacement surgery: Secondary | ICD-10-CM | POA: Diagnosis not present

## 2016-05-13 DIAGNOSIS — E119 Type 2 diabetes mellitus without complications: Secondary | ICD-10-CM | POA: Diagnosis not present

## 2016-05-13 DIAGNOSIS — Z96641 Presence of right artificial hip joint: Secondary | ICD-10-CM | POA: Diagnosis not present

## 2016-05-13 DIAGNOSIS — Z7984 Long term (current) use of oral hypoglycemic drugs: Secondary | ICD-10-CM | POA: Diagnosis not present

## 2016-05-18 DIAGNOSIS — Z471 Aftercare following joint replacement surgery: Secondary | ICD-10-CM | POA: Diagnosis not present

## 2016-05-18 DIAGNOSIS — Z96641 Presence of right artificial hip joint: Secondary | ICD-10-CM | POA: Diagnosis not present

## 2016-05-18 DIAGNOSIS — Z7984 Long term (current) use of oral hypoglycemic drugs: Secondary | ICD-10-CM | POA: Diagnosis not present

## 2016-05-18 DIAGNOSIS — Z7901 Long term (current) use of anticoagulants: Secondary | ICD-10-CM | POA: Diagnosis not present

## 2016-05-18 DIAGNOSIS — E119 Type 2 diabetes mellitus without complications: Secondary | ICD-10-CM | POA: Diagnosis not present

## 2016-06-02 DIAGNOSIS — D479 Neoplasm of uncertain behavior of lymphoid, hematopoietic and related tissue, unspecified: Secondary | ICD-10-CM | POA: Diagnosis not present

## 2016-06-02 DIAGNOSIS — D485 Neoplasm of uncertain behavior of skin: Secondary | ICD-10-CM | POA: Diagnosis not present

## 2016-06-02 DIAGNOSIS — Z79899 Other long term (current) drug therapy: Secondary | ICD-10-CM | POA: Diagnosis not present

## 2016-06-02 DIAGNOSIS — B078 Other viral warts: Secondary | ICD-10-CM | POA: Diagnosis not present

## 2016-06-02 DIAGNOSIS — C8400 Mycosis fungoides, unspecified site: Secondary | ICD-10-CM | POA: Diagnosis not present

## 2016-06-09 DIAGNOSIS — M1711 Unilateral primary osteoarthritis, right knee: Secondary | ICD-10-CM | POA: Diagnosis not present

## 2016-06-09 DIAGNOSIS — Z96643 Presence of artificial hip joint, bilateral: Secondary | ICD-10-CM | POA: Diagnosis not present

## 2016-06-09 DIAGNOSIS — Z471 Aftercare following joint replacement surgery: Secondary | ICD-10-CM | POA: Diagnosis not present

## 2016-06-22 DIAGNOSIS — C8400 Mycosis fungoides, unspecified site: Secondary | ICD-10-CM | POA: Diagnosis not present

## 2016-06-24 DIAGNOSIS — C8400 Mycosis fungoides, unspecified site: Secondary | ICD-10-CM | POA: Diagnosis not present

## 2016-06-28 ENCOUNTER — Other Ambulatory Visit: Payer: Self-pay | Admitting: Internal Medicine

## 2016-06-28 DIAGNOSIS — E1129 Type 2 diabetes mellitus with other diabetic kidney complication: Secondary | ICD-10-CM | POA: Diagnosis not present

## 2016-06-28 DIAGNOSIS — N183 Chronic kidney disease, stage 3 (moderate): Secondary | ICD-10-CM | POA: Diagnosis not present

## 2016-06-28 DIAGNOSIS — I129 Hypertensive chronic kidney disease with stage 1 through stage 4 chronic kidney disease, or unspecified chronic kidney disease: Secondary | ICD-10-CM | POA: Diagnosis not present

## 2016-06-28 DIAGNOSIS — R809 Proteinuria, unspecified: Secondary | ICD-10-CM | POA: Diagnosis not present

## 2016-06-29 DIAGNOSIS — C8400 Mycosis fungoides, unspecified site: Secondary | ICD-10-CM | POA: Diagnosis not present

## 2016-07-05 DIAGNOSIS — Z79899 Other long term (current) drug therapy: Secondary | ICD-10-CM | POA: Diagnosis not present

## 2016-07-06 DIAGNOSIS — C8400 Mycosis fungoides, unspecified site: Secondary | ICD-10-CM | POA: Diagnosis not present

## 2016-07-08 DIAGNOSIS — C8400 Mycosis fungoides, unspecified site: Secondary | ICD-10-CM | POA: Diagnosis not present

## 2016-07-12 ENCOUNTER — Other Ambulatory Visit: Payer: Self-pay | Admitting: Internal Medicine

## 2016-07-13 DIAGNOSIS — C8400 Mycosis fungoides, unspecified site: Secondary | ICD-10-CM | POA: Diagnosis not present

## 2016-07-15 DIAGNOSIS — C8400 Mycosis fungoides, unspecified site: Secondary | ICD-10-CM | POA: Diagnosis not present

## 2016-07-20 ENCOUNTER — Ambulatory Visit (INDEPENDENT_AMBULATORY_CARE_PROVIDER_SITE_OTHER): Payer: PPO

## 2016-07-20 VITALS — BP 138/78 | HR 80 | Temp 97.8°F | Resp 14 | Ht 66.5 in | Wt 238.8 lb

## 2016-07-20 DIAGNOSIS — Z0000 Encounter for general adult medical examination without abnormal findings: Secondary | ICD-10-CM

## 2016-07-20 DIAGNOSIS — C8400 Mycosis fungoides, unspecified site: Secondary | ICD-10-CM | POA: Diagnosis not present

## 2016-07-20 NOTE — Patient Instructions (Addendum)
[name] , Thank you for taking time to come for your Medicare Wellness Visit. I appreciate your ongoing commitment to your health goals. Please review the following plan we discussed and let me know if I can assist you in the future.   Return this Friday for your follow up with Dr. Derrel Nip.  These are the goals we discussed: Goals    . Increase physical activity          3 days a week on the stair master for 60 minutes       This is a list of the screening recommended for you and due dates:  Health Maintenance  Topic Date Due  .  Hepatitis C: One time screening is recommended by Center for Disease Control  (CDC) for  adults born from [birth year] through 1966.   [birth year]  . Tetanus Vaccine  09/09/1963  . Shingles Vaccine  09/08/2004  . Pneumonia vaccines (1 of 2 - PCV13) 09/08/2009  . Hemoglobin A1C  07/29/2016  . Complete foot exam   10/27/2016  . Eye exam for diabetics  11/10/2016  . Colon Cancer Screening  09/30/2018  . Flu Shot  Completed    Fall Prevention in the Home Introduction Falls can cause injuries. They can happen to people of all ages. There are many things you can do to make your home safe and to help prevent falls. What can I do on the outside of my home?  Regularly fix the edges of walkways and driveways and fix any cracks.  Remove anything that might make you trip as you walk through a door, such as a raised step or threshold.  Trim any bushes or trees on the path to your home.  Use bright outdoor lighting.  Clear any walking paths of anything that might make someone trip, such as rocks or tools.  Regularly check to see if handrails are loose or broken. Make sure that both sides of any steps have handrails.  Any raised decks and porches should have guardrails on the edges.  Have any leaves, snow, or ice cleared regularly.  Use sand or salt on walking paths during winter.  Clean up any spills in your garage right away. This includes oil or grease  spills. What can I do in the bathroom?  Use night lights.  Install grab bars by the toilet and in the tub and shower. Do not use towel bars as grab bars.  Use non-skid mats or decals in the tub or shower.  If you need to sit down in the shower, use a plastic, non-slip stool.  Keep the floor dry. Clean up any water that spills on the floor as soon as it happens.  Remove soap buildup in the tub or shower regularly.  Attach bath mats securely with double-sided non-slip rug tape.  Do not have throw rugs and other things on the floor that can make you trip. What can I do in the bedroom?  Use night lights.  Make sure that you have a light by your bed that is easy to reach.  Do not use any sheets or blankets that are too big for your bed. They should not hang down onto the floor.  Have a firm chair that has side arms. You can use this for support while you get dressed.  Do not have throw rugs and other things on the floor that can make you trip. What can I do in the kitchen?  Clean up any spills right away.  Avoid walking on wet floors.  Keep items that you use a lot in easy-to-reach places.  If you need to reach something above you, use a strong step stool that has a grab bar.  Keep electrical cords out of the way.  Do not use floor polish or wax that makes floors slippery. If you must use wax, use non-skid floor wax.  Do not have throw rugs and other things on the floor that can make you trip. What can I do with my stairs?  Do not leave any items on the stairs.  Make sure that there are handrails on both sides of the stairs and use them. Fix handrails that are broken or loose. Make sure that handrails are as long as the stairways.  Check any carpeting to make sure that it is firmly attached to the stairs. Fix any carpet that is loose or worn.  Avoid having throw rugs at the top or bottom of the stairs. If you do have throw rugs, attach them to the floor with carpet  tape.  Make sure that you have a light switch at the top of the stairs and the bottom of the stairs. If you do not have them, ask someone to add them for you. What else can I do to help prevent falls?  Wear shoes that:  Do not have high heels.  Have rubber bottoms.  Are comfortable and fit you well.  Are closed at the toe. Do not wear sandals.  If you use a stepladder:  Make sure that it is fully opened. Do not climb a closed stepladder.  Make sure that both sides of the stepladder are locked into place.  Ask someone to hold it for you, if possible.  Clearly mark and make sure that you can see:  Any grab bars or handrails.  First and last steps.  Where the edge of each step is.  Use tools that help you move around (mobility aids) if they are needed. These include:  Canes.  Walkers.  Scooters.  Crutches.  Turn on the lights when you go into a dark area. Replace any light bulbs as soon as they burn out.  Set up your furniture so you have a clear path. Avoid moving your furniture around.  If any of your floors are uneven, fix them.  If there are any pets around you, be aware of where they are.  Review your medicines with your doctor. Some medicines can make you feel dizzy. This can increase your chance of falling. Ask your doctor what other things that you can do to help prevent falls. This information is not intended to replace advice given to you by your health care provider. Make sure you discuss any questions you have with your health care provider. Document Released: 05/22/2009 Document Revised: 01/01/2016 Document Reviewed: 08/30/2014  2017 Elsevier

## 2016-07-20 NOTE — Progress Notes (Addendum)
Subjective:   Daniel Kline is a 70 y.o. male who presents for an Initial Medicare Annual Wellness Visit.  Review of Systems  No ROS.  Medicare Wellness Visit.  Cardiac Risk Factors include: advanced age (>70men, >30 women);male gender;hypertension;diabetes mellitus;obesity (BMI >30kg/m2)    Objective:    Today's Vitals   07/20/16 1325  BP: 138/78  Pulse: 80  Resp: 14  Temp: 97.8 F (36.6 C)  TempSrc: Oral  SpO2: 95%  Weight: 238 lb 12.8 oz (108.3 kg)  Height: 5' 6.5" (1.689 m)   Body mass index is 37.97 kg/m.  Current Medications (verified) Outpatient Encounter Prescriptions as of 07/20/2016  Medication Sig  . amLODipine (NORVASC) 10 MG tablet TAKE ONE TABLET EVERY DAY  . aspirin (ASPIR-81) 81 MG EC tablet Take 81 mg by mouth daily. Reported on 10/28/2015  . clobetasol cream (TEMOVATE) 6.56 % Apply 1 application topically 2 (two) times daily.   . clorazepate (TRANXENE) 7.5 MG tablet TAKE ONE TABLET BY MOUTH 3 TIMES DAILY AS NEEDED  . famotidine (PEPCID) 20 MG tablet TAKE ONE TABLET BY MOUTH TWICE DAILY  . furosemide (LASIX) 20 MG tablet TAKE ONE TABLET EVERY MORNING  . gabapentin (NEURONTIN) 300 MG capsule TAKE 1 CAPSULE 3 TIMES DAILY  . glucose blood (ONE TOUCH ULTRA TEST) test strip   . hydrALAZINE (APRESOLINE) 25 MG tablet TAKE ONE TABLET 3 TIMES DAILY  . hydrALAZINE (APRESOLINE) 25 MG tablet TAKE THREE TABLETS BY MOUTH 3 TIMES DAILY  . hydrALAZINE (APRESOLINE) 50 MG tablet Take 1.5 tablets (75 mg total) by mouth 3 (three) times daily.  . hydrOXYzine (ATARAX/VISTARIL) 25 MG tablet Take 25 mg by mouth every 8 (eight) hours as needed.   . INTRON A 6000000 UNIT/ML injection   . latanoprost (XALATAN) 0.005 % ophthalmic solution Place 1 drop into the right eye at bedtime.  Marland Kitchen losartan (COZAAR) 100 MG tablet Take 1 tablet (100 mg total) by mouth daily.  . metFORMIN (GLUCOPHAGE) 500 MG tablet TAKE ONE TABLET TWICE DAILY  . montelukast (SINGULAIR) 10 MG tablet TAKE ONE  TABLET BY MOUTH AT BEDTIME  . Multiple Vitamins-Minerals (MULTIVITAL) tablet Take 1 tablet by mouth daily.    . [DISCONTINUED] HYDROcodone-acetaminophen (NORCO/VICODIN) 5-325 MG tablet Take 1 tablet by mouth every 6 (six) hours as needed for moderate pain.  . [DISCONTINUED] methotrexate (RHEUMATREX) 2.5 MG tablet Take 2.5 mg by mouth once a week. 10 tablets on Wednesday  . [DISCONTINUED] potassium chloride SA (K-DUR,KLOR-CON) 20 MEQ tablet TAKE ONE TABLET BY MOUTH EVERY DAY  . Cholecalciferol (VITAMIN D-3) 1000 units CAPS Take 2 capsules by mouth daily. Reported on 02/27/2016  . doxycycline (VIBRA-TABS) 100 MG tablet Take 1 tablet by mouth 2 (two) times daily.  Marland Kitchen EPINEPHrine (EPIPEN 2-PAK) 0.3 mg/0.3 mL IJ SOAJ injection Inject 0.3 mLs (0.3 mg total) into the muscle once. (Patient not taking: Reported on 07/20/2016)  . folic acid (FOLVITE) 1 MG tablet TAKE ONE TABLET BY MOUTH EVERY DAY EXCEPT ON METHOTREXATE DAYS (Patient not taking: Reported on 07/20/2016)   No facility-administered encounter medications on file as of 07/20/2016.     Allergies (verified) Oxycodone and Zolpidem   History: Past Medical History:  Diagnosis Date  . Arthritis   . Diabetes mellitus without complication ([place])   . HTN (hypertension)   . MRSA (methicillin resistant Staphylococcus aureus)   . Polio    Past Surgical History:  Procedure Laterality Date  . right elbow surgery    . right knee surgery    .  right shoulder surgery    . spleenectomy    . TOTAL HIP ARTHROPLASTY  04/2016   History reviewed. No pertinent family history. Social History   Occupational History  . Not on file.   Social History Main Topics  . Smoking status: Former Smoker    Quit date: 09/23/1990  . Smokeless tobacco: Former Systems developer    Quit date: 02/25/2006     Comment: tobacco use- no   . Alcohol use No  . Drug use: No  . Sexual activity: Yes    Partners: Female   Tobacco Counseling Counseling given: Not Answered   Activities  of Daily Living In your present state of health, do you have any difficulty performing the following activities: 07/20/2016  Hearing? N  Vision? N  Difficulty concentrating or making decisions? N  Walking or climbing stairs? N  Dressing or bathing? N  Doing errands, shopping? N  Preparing Food and eating ? N  Using the Toilet? N  In the past six months, have you accidently leaked urine? N  Do you have problems with loss of bowel control? N  Managing your Medications? N  Managing your Finances? N  Housekeeping or managing your Housekeeping? N  Some recent data might be hidden    Immunizations and Health Maintenance Immunization History  Administered Date(s) Administered  . Influenza Split 05/30/2015  . Influenza-Unspecified 05/24/2016   Health Maintenance Due  Topic Date Due  . Hepatitis C Screening  01-06-1946  . TETANUS/TDAP  09/09/1963  . ZOSTAVAX  09/08/2004  . PNA vac Low Risk Adult (1 of 2 - PCV13) 09/08/2009    Patient Care Team: Daniel Mc, MD as PCP - General (Internal Medicine)  Indicate any recent Medical Services you may have received from other than [facility] providers in the past year (date may be approximate).    Assessment:   This is a routine wellness examination for Daniel Kline.  The goal of the wellness visit is to assist the patient how to close the gaps in care and create a preventative care plan for the patient.   Taking calcium VIT D3 as appropriate/Osteoporosis risk reviewed.  Medications reviewed; taking without issues or barriers.  Safety issues reviewed; lives with wife. Smoke detectors in the home. No firearms in the home. Wears seatbelts when driving or riding with others. No violence in the home.  No identified risk were noted; The patient was oriented x 3; appropriate in dress and manner and no objective failures at ADL's or IADL's. Recent R hip replacement; cane is no longer in use.    BMI; discussed the importance of a healthy diet,  water intake and exercise. He tries to have a healthy diet, adequate water intake and uses the stair master with weights for exercise.  He plans to increase his physical activity. Educational material provided.  Prevnar 13 , ZOSTAVAX and TDAP vaccine postponed per patient request.  Educational material provided.  Hepatitis C Screening discussed; educational material provided.  Patient Concerns: None at this time. Follow up with PCP as needed.  Hearing/Vision screen Hearing Screening Comments: Passes the whisper test Vision Screening Comments: Followed by [facility] (Dr. Manuella Ghazi) Last OV 11/2015 Wear glasses  Dietary issues and exercise activities discussed: Current Exercise Habits: Home exercise routine, Time (Minutes): 60, Frequency (Times/Week): 3, Weekly Exercise (Minutes/Week): 180, Intensity: Moderate  Goals    . Increase physical activity          3 days a week on the stair master for 60  minutes      Depression Screen PHQ 2/9 Scores 07/20/2016  PHQ - 2 Score 0    Fall Risk Fall Risk  07/20/2016  Falls in the past year? No    Cognitive Function: MMSE - Mini Mental State Exam 07/20/2016  Orientation to time 5  Orientation to Place 5  Registration 3  Attention/ Calculation 5  Recall 3  Language- name 2 objects 2  Language- repeat 1  Language- follow 3 step command 3  Language- read & follow direction 1  Write a sentence 1  Copy design 1  Total score 30        Screening Tests Health Maintenance  Topic Date Due  . Hepatitis C Screening  05/29/1946  . TETANUS/TDAP  09/09/1963  . ZOSTAVAX  09/08/2004  . PNA vac Low Risk Adult (1 of 2 - PCV13) 09/08/2009  . HEMOGLOBIN A1C  07/29/2016  . FOOT EXAM  10/27/2016  . OPHTHALMOLOGY EXAM  11/10/2016  . COLONOSCOPY  09/30/2018  . INFLUENZA VACCINE  Completed        Plan:    End of life planning; Advance aging; Advanced directives discussed. Copy of current HCPOA/Living Will requested.  Medicare  Attestation I have personally reviewed: The patient's medical and social history Their use of alcohol, tobacco or illicit drugs Their current medications and supplements The patient's functional ability including ADLs,fall risks, home safety risks, cognitive, and hearing and visual impairment Diet and physical activities Evidence for depression   The patient's weight, height, BMI, and visual acuity have been recorded in the chart.  I have made referrals and provided education to the patient based on review of the above and I have provided the patient with a written personalized care plan for preventive services.    During the course of the visit Vivian was educated and counseled about the following appropriate screening and preventive services:   Vaccines to include Pneumoccal, Influenza, Hepatitis B, Td, Zostavax, HCV  Electrocardiogram  Colorectal cancer screening  Cardiovascular disease screening  Diabetes screening  Glaucoma screening  Nutrition counseling  Prostate cancer screening  Smoking cessation counseling  Patient Instructions (the written plan) were given to the patient.   OBrien-Blaney, Denisa L, LPN   [id number]     I have reviewed the above information and agree with above.   Deborra Medina, MD

## 2016-07-22 DIAGNOSIS — C8400 Mycosis fungoides, unspecified site: Secondary | ICD-10-CM | POA: Diagnosis not present

## 2016-07-23 ENCOUNTER — Ambulatory Visit (INDEPENDENT_AMBULATORY_CARE_PROVIDER_SITE_OTHER): Payer: PPO | Admitting: Internal Medicine

## 2016-07-23 VITALS — BP 162/82 | HR 83 | Temp 97.6°F | Ht 66.5 in | Wt 238.6 lb

## 2016-07-23 DIAGNOSIS — E7800 Pure hypercholesterolemia, unspecified: Secondary | ICD-10-CM

## 2016-07-23 DIAGNOSIS — I10 Essential (primary) hypertension: Secondary | ICD-10-CM | POA: Diagnosis not present

## 2016-07-23 DIAGNOSIS — E1121 Type 2 diabetes mellitus with diabetic nephropathy: Secondary | ICD-10-CM | POA: Diagnosis not present

## 2016-07-23 LAB — COMPREHENSIVE METABOLIC PANEL
ALBUMIN: 4 g/dL (ref 3.5–5.2)
ALT: 28 U/L (ref 0–53)
AST: 22 U/L (ref 0–37)
Alkaline Phosphatase: 71 U/L (ref 39–117)
BUN: 23 mg/dL (ref 6–23)
CALCIUM: 9.6 mg/dL (ref 8.4–10.5)
CHLORIDE: 102 meq/L (ref 96–112)
CO2: 32 meq/L (ref 19–32)
CREATININE: 1.48 mg/dL (ref 0.40–1.50)
GFR: 49.68 mL/min — ABNORMAL LOW (ref >60.00)
Glucose, Bld: 180 mg/dL — ABNORMAL HIGH (ref 70–99)
POTASSIUM: 4.4 meq/L (ref 3.5–5.1)
Sodium: 141 mEq/L (ref 135–145)
Total Bilirubin: 0.4 mg/dL (ref 0.2–1.2)
Total Protein: 6 g/dL (ref 6.0–8.3)

## 2016-07-23 LAB — MICROALBUMIN / CREATININE URINE RATIO
CREATININE, U: 153 mg/dL
MICROALB UR: 46.8 mg/dL — AB (ref 0.0–1.9)
MICROALB/CREAT RATIO: 30.6 mg/g — AB (ref 0.0–30.0)

## 2016-07-23 LAB — LIPID PANEL
CHOLESTEROL: 166 mg/dL (ref 0–200)
HDL: 40.3 mg/dL (ref >39.00)
LDL Cholesterol: 90 mg/dL (ref 0–99)
NONHDL: 125.56
Total CHOL/HDL Ratio: 4
Triglycerides: 178 mg/dL — ABNORMAL HIGH (ref 0.0–149.0)
VLDL: 35.6 mg/dL (ref 0.0–40.0)

## 2016-07-23 LAB — TSH: TSH: 2.95 u[IU]/mL (ref 0.35–4.50)

## 2016-07-23 LAB — LDL CHOLESTEROL, DIRECT: LDL DIRECT: 116 mg/dL

## 2016-07-23 LAB — HEMOGLOBIN A1C: HEMOGLOBIN A1C: 7.6 % — AB (ref 4.6–6.5)

## 2016-07-23 NOTE — Progress Notes (Signed)
Subjective:  Patient ID: Daniel Kline, male    DOB: [birth year]  Age: 70 y.o. MRN: [id number]  CC: The primary encounter diagnosis was Essential hypertension. Diagnoses of Diabetes mellitus with nephropathy ([place]) and Hypercholesterolemia were also pertinent to this visit.  HPI Daniel Kline presents for follow up on multiple chronic issues including uncontrolled hypertension ,  Type 2 DM, chronic hip pain and obesity     Home readings of blood pressure  Using a manual machine are under 140/70.   Using the hydralazine but not 3 times daily (Hydralazine added in July) .  Sleep study ordered in May but not done .  Has a history of intermittent snoring and uncontrolled hypertension but wife has not noticed any apneic spells.     Had right hip replaced  On Sept 18h  Rehab completed,  Exercising on his own 4-5 days per week,  Going to discuss left knee replacement in February   2018  Obesity:  Has lost 5 lbs ,  Then  gained 5 lbs since July  BMI still  38,  Exercising 4-5 days pwer week,  As resume using the stairmaster for cardio and a recumbent bike   Diet reviewed:   Eggs bacon and 2 pc whole wheat  toast   Lunch:  Skips, or has cheeseburger or other fast food  3-4 times per week.   Dinner: potatoes,  Grilled Steak  With a  Biscuit; sometimes pizza , sometimes lasagna/pasta   DM:  last a1c 7.6 in June sugars running "pretty good (   155  To  160 post prandially) ON metformin alone.  Drinking V8 juice with vinegar, [facility].   Watching salt otherwise      Lab Results  Component Value Date   HGBA1C 7.6 (H) 07/23/2016    Outpatient Medications Prior to Visit  Medication Sig Dispense Refill  . amLODipine (NORVASC) 10 MG tablet TAKE ONE TABLET EVERY DAY 90 tablet 0  . aspirin (ASPIR-81) 81 MG EC tablet Take 81 mg by mouth daily. Reported on 10/28/2015    . Cholecalciferol (VITAMIN D-3) 1000 units CAPS Take 2 capsules by mouth daily. Reported on 02/27/2016    . clobetasol cream  (TEMOVATE) 1.76 % Apply 1 application topically 2 (two) times daily.     . clorazepate (TRANXENE) 7.5 MG tablet TAKE ONE TABLET BY MOUTH 3 TIMES DAILY AS NEEDED 90 tablet 1  . doxycycline (VIBRA-TABS) 100 MG tablet Take 1 tablet by mouth 2 (two) times daily.    Marland Kitchen EPINEPHrine (EPIPEN 2-PAK) 0.3 mg/0.3 mL IJ SOAJ injection Inject 0.3 mLs (0.3 mg total) into the muscle once. 1 Device 1  . famotidine (PEPCID) 20 MG tablet TAKE ONE TABLET BY MOUTH TWICE DAILY 60 tablet 0  . folic acid (FOLVITE) 1 MG tablet TAKE ONE TABLET BY MOUTH EVERY DAY EXCEPT ON METHOTREXATE DAYS 90 tablet 1  . furosemide (LASIX) 20 MG tablet TAKE ONE TABLET EVERY MORNING 30 tablet 3  . gabapentin (NEURONTIN) 300 MG capsule TAKE 1 CAPSULE 3 TIMES DAILY 90 capsule 2  . glucose blood (ONE TOUCH ULTRA TEST) test strip     . hydrALAZINE (APRESOLINE) 25 MG tablet TAKE ONE TABLET 3 TIMES DAILY 90 tablet   . hydrALAZINE (APRESOLINE) 25 MG tablet TAKE THREE TABLETS BY MOUTH 3 TIMES DAILY 240 tablet 1  . hydrALAZINE (APRESOLINE) 50 MG tablet Take 1.5 tablets (75 mg total) by mouth 3 (three) times daily. 120 tablet 2  . hydrOXYzine (ATARAX/VISTARIL)  25 MG tablet Take 25 mg by mouth every 8 (eight) hours as needed.     . INTRON A 6000000 UNIT/ML injection     . latanoprost (XALATAN) 0.005 % ophthalmic solution Place 1 drop into the right eye at bedtime.    Marland Kitchen losartan (COZAAR) 100 MG tablet Take 1 tablet (100 mg total) by mouth daily. 90 tablet 3  . metFORMIN (GLUCOPHAGE) 500 MG tablet TAKE ONE TABLET TWICE DAILY 60 tablet 5  . montelukast (SINGULAIR) 10 MG tablet TAKE ONE TABLET BY MOUTH AT BEDTIME 30 tablet 2  . Multiple Vitamins-Minerals (MULTIVITAL) tablet Take 1 tablet by mouth daily.       No facility-administered medications prior to visit.     Review of Systems;  Patient denies headache, fevers, malaise, unintentional weight loss, skin rash, eye pain, sinus congestion and sinus pain, sore throat, dysphagia,  hemoptysis , cough,  dyspnea, wheezing, chest pain, palpitations, orthopnea, edema, abdominal pain, nausea, melena, diarrhea, constipation, flank pain, dysuria, hematuria, urinary  Frequency, nocturia, numbness, tingling, seizures,  Focal weakness, Loss of consciousness,  Tremor, insomnia, depression, anxiety, and suicidal ideation.      Objective:  BP (!) 162/82   Pulse 83   Temp 97.6 F (36.4 C) (Oral)   Ht 5' 6.5" (1.689 m)   Wt 238 lb 9.6 oz (108.2 kg)   SpO2 96%   BMI 37.93 kg/m   BP Readings from Last 3 Encounters:  07/23/16 (!) 162/82  07/20/16 138/78  02/27/16 (!) 150/82    Wt Readings from Last 3 Encounters:  07/23/16 238 lb 9.6 oz (108.2 kg)  07/20/16 238 lb 12.8 oz (108.3 kg)  02/27/16 233 lb (105.7 kg)    General appearance: alert, cooperative and appears stated age Ears: normal TM's and external ear canals both ears Throat: lips, mucosa, and tongue normal; teeth and gums normal Neck: no adenopathy, no carotid bruit, supple, symmetrical, trachea midline and thyroid not enlarged, symmetric, no tenderness/mass/nodules Back: symmetric, no curvature. ROM normal. No CVA tenderness. Lungs: clear to auscultation bilaterally Heart: regular rate and rhythm, S1, S2 normal, no murmur, click, rub or gallop Abdomen: soft, non-tender; bowel sounds normal; no masses,  no organomegaly Pulses: 2+ and symmetric Skin: Skin color, texture, turgor normal. No rashes or lesions Lymph nodes: Cervical, supraclavicular, and axillary nodes normal.  Lab Results  Component Value Date   HGBA1C 7.6 (H) 07/23/2016   HGBA1C 7.6 (H) 01/28/2016   HGBA1C 7.1 (H) 09/24/2015    Lab Results  Component Value Date   CREATININE 1.48 07/23/2016   CREATININE 1.27 (H) 02/09/2016   CREATININE 1.34 01/28/2016    Lab Results  Component Value Date   WBC 7.8 11/28/2015   HGB 14.4 11/28/2015   HCT 43.6 11/28/2015   PLT 242 11/28/2015   GLUCOSE 180 (H) 07/23/2016   CHOL 166 07/23/2016   TRIG 178.0 (H) 07/23/2016    HDL 40.30 07/23/2016   LDLDIRECT 116.0 07/23/2016   LDLCALC 90 07/23/2016   ALT 28 07/23/2016   AST 22 07/23/2016   NA 141 07/23/2016   K 4.4 07/23/2016   CL 102 07/23/2016   CREATININE 1.48 07/23/2016   BUN 23 07/23/2016   CO2 32 07/23/2016   TSH 2.95 07/23/2016   PSA 1.60 09/24/2015   HGBA1C 7.6 (H) 07/23/2016   MICROALBUR 46.8 (H) 07/23/2016    US Renal  Result Date: 03/17/2016 CLINICAL DATA:  Question of renal mass EXAM: RENAL / URINARY TRACT ULTRASOUND COMPLETE COMPARISON:  None. FINDINGS: Right Kidney:  Length: 9.6 cm. No hydronephrosis is seen. The echogenicity of the renal parenchyma is within normal limits. A hypoechoic structures noted laterally in the mid right kidney of 1.2 cm in diameter most consistent with cyst. Left Kidney: Length: 10.5 cm. No hydronephrosis is seen. A single non obstructing 6 mm calculus is noted. There is a hypoechoic structure in the lower pole of the left kidney laterally of 1.7 cm most consistent with cyst. No solid renal lesion is seen. Bladder: The urinary bladder is not well distended but no abnormality is noted. IMPRESSION: 1. No solid renal mass is seen. There appear to be single small cysts bilaterally. 2. No hydronephrosis. 3. Nonobstructing 6 mm left renal calculus. Electronically Signed   By: Ivar Drape M.D.   On: 03/17/2016 15:11    Assessment & Plan:   Problem List Items Addressed This Visit    Diabetes mellitus with nephropathy ([place])     No change in control since las visit .  Diet reviewed and reduction in starches advised. .  . Patient is up-to-date on eye exams and foot exam is normal today.. Patient is tolerating statin therapy for CAD risk reduction and on ACE/ARB for reduction in proteinuria.  Adding glipizide 5 mg twice daily .  Lab Results  Component Value Date   HGBA1C 7.6 (H) 07/23/2016   Lab Results  Component Value Date   MICROALBUR 46.8 (H) 07/23/2016           Relevant Medications   glipiZIDE (GLUCOTROL) 5 MG  tablet   Other Relevant Orders   Hemoglobin A1c (Completed)   Microalbumin / creatinine urine ratio (Completed)   Lipid panel (Completed)   Essential hypertension - Primary    Persistent elevations despite use of 4 medications,  No evidence of RAS by 2017 dopplers.  24 hour blood pressure monitor ordered.       Relevant Orders   Comprehensive metabolic panel (Completed)   Hypercholesterolemia   Relevant Orders   LDL cholesterol, direct (Completed)   TSH (Completed)     A total of 25 minutes of face to face time was spent with patient more than half of which was spent in counselling about the above mentioned conditions  and coordination of care  I am having [name] start on glipiZIDE. I am also having him maintain his aspirin, MULTIVITAL, clobetasol cream, doxycycline, hydrOXYzine, glucose blood, latanoprost, Vitamin D-3, EPINEPHrine, folic acid, losartan, metFORMIN, INTRON A, montelukast, clorazepate, hydrALAZINE, hydrALAZINE, furosemide, hydrALAZINE, amLODipine, famotidine, and gabapentin.  Meds ordered this encounter  Medications  . glipiZIDE (GLUCOTROL) 5 MG tablet    Sig: Take 1 tablet (5 mg total) by mouth 2 (two) times daily before a meal.    Dispense:  60 tablet    Refill:  3    There are no discontinued medications.  Follow-up: Return in about 3 months (around 10/21/2016) for follow up diabetes.   Crecencio Mc, MD

## 2016-07-23 NOTE — Progress Notes (Signed)
Pre visit review using our clinic review tool, if applicable. No additional management support is needed unless otherwise documented below in the visit note. 

## 2016-07-23 NOTE — Patient Instructions (Addendum)
The hydralazine dose is 75 mg every 8 hours ( 3 times daily)   Or goal is 120/70  For blood pressure ,  NOT 130/80   I am ordering a 24 blood pressure ambulatory monitor    You can lose weight if you follow Dr. Lupita Dawn  example of a  "Low Glycemic indexI"  Diet:  It will allow you to lose 4 to 8  lbs  per month if you follow it carefully.  Your goal with exercise is a minimum of 30 minutes of aerobic exercise 5 days per week (Walking does not count once it becomes easy!)    All of the foods can be found at grocery stores and in bulk at [employer].  The Atkins protein bars and shakes are available in more varieties at [employer], [employer] and [employer].     7 AM Breakfast:  Choose from the following:  Low carbohydrate Protein  Shakes (I recommend the  Premier Protein chocolate shakes,  EAS AdvantEdge "Carb Control" shakes  Or the Atkins shakes all are under 3 net carbs)     a scrambled egg/bacon/cheese burrito made with Mission's "carb balance" whole wheat tortilla  (about 10 net carbs )  Regulatory affairs officer (basically a quiche without the pastry crust) that is eaten cold and very convenient way to get your eggs.  8 carbs)  If you make your own protein shakes, avoid bananas and pineapple,  And use low carb greek yogurt or original /unsweetened almond or soy milk    Avoid cereal and bananas, oatmeal and cream of wheat and grits. They are loaded with carbohydrates!   10 AM: high protein snack:  Protein bar by Atkins (the snack size, under 200 cal, usually < 6 net carbs).    A stick of cheese:  Around 1 carb,  100 cal     Dannon Light n Fit [place] Yogurt  (80 cal, 8 carbs)  Other so called "protein bars" and Greek yogurts tend to be loaded with carbohydrates.  Remember, in food advertising, the word "energy" is synonymous for " carbohydrate."  Lunch:   A Sandwich using the bread choices listed, Can use any  Eggs,  lunchmeat, grilled meat or canned tuna), avocado, regular  mayo/mustard  and cheese.  A Salad using blue cheese, ranch,  Goddess or vinagrette,  Avoid taco shells, croutons or "confetti" and no "candied nuts" but regular nuts OK.   No pretzels, nabs  or chips.  Pickles and miniature sweet peppers are a good low carb alternative that provide a "crunch"  The bread is the only source of carbohydrate in a sandwich and  can be decreased by trying some of the attached alternatives to traditional loaf bread   Avoid "Low fat dressings, as well as [place] dressings They are loaded with sugar!   3 PM/ Mid day  Snack:  Consider  1 ounce of  almonds, walnuts, pistachios, pecans, peanuts,  Macadamia nuts or a nut medley.  Avoid "granola and granola bars "  Mixed nuts are ok in moderation as long as there are no raisins,  cranberries or dried fruit.   KIND bars are OK if you get the low glycemic index variety   Try the prosciutto/mozzarella cheese sticks by Fiorruci  In deli /backery section   High protein      6 PM  Dinner:     Meat/fowl/fish with a green salad, and either broccoli, cauliflower, green beans, spinach, brussel  sprouts or  Lima beans. DO NOT BREAD THE PROTEIN!!      There is a low carb pasta by Dreamfield's that is acceptable and tastes great: only 5 digestible carbs/serving.( All grocery stores but BJs carry it ) Several ready made meals are available low carb:   Try Michel Angelo's chicken piccata or chicken or eggplant parm over low carb pasta.(Lowes and BJs)   Marjory Lies Sanchez's "Carnitas" (pulled pork, no sauce,  0 carbs) or his beef pot roast to make a dinner burrito (at [employer]J's)  Pesto over low carb pasta (bj's sells a good quality pesto in the center refrigerated section of the deli   Try satueeing  Cheral Marker with mushroooms as a good side   [employer] makes a mashed cauliflower that tastes like mashed potatoes  Whole wheat pasta is still full of digestible carbs and  Not as low in glycemic index as Dreamfield's.   Brown  rice is still rice,  So skip the rice and noodles if you eat [place] or [place] (or at least limit to 1/2 cup)  9 PM snack :   Breyer's "low carb" fudgsicle or  ice cream bar (Carb Smart line), or  Weight Watcher's ice cream bar , or another "no sugar added" ice cream;  a serving of fresh berries/cherries with whipped cream   Cheese or DANNON'S LlGHT N FIT GREEK YOGURT  8 ounces of [place] unsweetened almond/cococunut milk    Treat yourself to a parfait made with whipped cream blueberiies, walnuts and vanilla greek yogurt  Avoid bananas, pineapple, grapes  and watermelon on a regular basis because they are high in sugar.  THINK OF THEM AS DESSERT  Remember that snack Substitutions should be less than 10 NET carbs per serving and meals < 20 carbs. Remember to subtract fiber grams to get the "net carbs."

## 2016-07-25 MED ORDER — GLIPIZIDE 5 MG PO TABS: 5.0000 mg | ORAL_TABLET | Freq: Two times a day (BID) | ORAL | 3 refills | Status: DC

## 2016-07-25 NOTE — Assessment & Plan Note (Signed)
Persistent elevations despite use of 4 medications,  No evidence of RAS by 2017 dopplers.  24 hour blood pressure monitor ordered.

## 2016-07-25 NOTE — Assessment & Plan Note (Addendum)
No change in control since las visit .  Diet reviewed and reduction in starches advised. .  . Patient is up-to-date on eye exams and foot exam is normal today.. Patient is tolerating statin therapy for CAD risk reduction and on ACE/ARB for reduction in proteinuria.  Adding glipizide 5 mg twice daily .  Lab Results  Component Value Date   HGBA1C 7.6 (H) 07/23/2016   Lab Results  Component Value Date   MICROALBUR 46.8 (H) 07/23/2016

## 2016-07-26 ENCOUNTER — Telehealth: Payer: Self-pay | Admitting: *Deleted

## 2016-07-26 NOTE — Telephone Encounter (Signed)
Patient requested lab results  Pt contact [phone]

## 2016-07-26 NOTE — Telephone Encounter (Signed)
Pt called back returning your call. Thank you!  Call pt @ [phone]

## 2016-07-27 DIAGNOSIS — C84A0 Cutaneous T-cell lymphoma, unspecified, unspecified site: Secondary | ICD-10-CM | POA: Diagnosis not present

## 2016-07-27 DIAGNOSIS — Z79899 Other long term (current) drug therapy: Secondary | ICD-10-CM | POA: Diagnosis not present

## 2016-07-27 NOTE — Telephone Encounter (Signed)
Called patient concerning lab results see lab note.

## 2016-07-27 NOTE — Telephone Encounter (Signed)
Pt called in regards lab results. Pt is going to [facility]. Can you please call after 3 pm. Thank you!  Call pt @ [phone]

## 2016-07-30 ENCOUNTER — Other Ambulatory Visit: Payer: Self-pay | Admitting: Internal Medicine

## 2016-07-30 ENCOUNTER — Telehealth: Payer: Self-pay | Admitting: Internal Medicine

## 2016-07-30 MED ORDER — GLUCOSE BLOOD VI STRP: ORAL_STRIP | 10 refills | Status: DC

## 2016-07-30 NOTE — Telephone Encounter (Signed)
Pt called and needs a new script for glucose blood (ONE TOUCH ULTRA TEST) test strip sent to the pharmacy. Please advise, thank you!  Pharmacy - [place] on [address] [facility] [address]

## 2016-07-30 NOTE — Telephone Encounter (Signed)
Was discontinued. Pts last potassium in 02/2016 was 4.9. Pt last seen 07/23/16. Please advise?

## 2016-07-30 NOTE — Telephone Encounter (Signed)
Glucose Strips filled.

## 2016-07-31 ENCOUNTER — Other Ambulatory Visit: Payer: Self-pay | Admitting: Internal Medicine

## 2016-08-03 ENCOUNTER — Other Ambulatory Visit: Payer: Self-pay | Admitting: Internal Medicine

## 2016-08-04 NOTE — Telephone Encounter (Signed)
Had labs in December m, potassium was 4.4 please find out if patient is currently taking , since Denisa d/c'd it . Thank you

## 2016-08-12 DIAGNOSIS — I499 Cardiac arrhythmia, unspecified: Secondary | ICD-10-CM | POA: Diagnosis not present

## 2016-08-12 DIAGNOSIS — I10 Essential (primary) hypertension: Secondary | ICD-10-CM | POA: Diagnosis not present

## 2016-08-12 NOTE — Telephone Encounter (Signed)
LVTCB

## 2016-08-13 NOTE — Telephone Encounter (Signed)
Pt is taking this medication once daily. He did state that he has a full bottle left.

## 2016-08-17 ENCOUNTER — Other Ambulatory Visit: Payer: Self-pay | Admitting: Internal Medicine

## 2016-08-19 ENCOUNTER — Telehealth: Payer: Self-pay | Admitting: Internal Medicine

## 2016-08-19 MED ORDER — CARVEDILOL 3.125 MG PO TABS: 3.1250 mg | ORAL_TABLET | Freq: Two times a day (BID) | ORAL | 3 refills | Status: DC

## 2016-08-19 NOTE — Telephone Encounter (Signed)
The results of his 24 hour ambulatory BP monitor confirm that his BP is uncontrolled about 35% of the time.  I would like him to continue the amlodipine ,  Losartan and hydralazine  But I am adding a medication called carvedilol, to take two times daily with breakfast and supper (evenign meal)

## 2016-08-20 NOTE — Telephone Encounter (Signed)
Patient advised of below and verbalized an understanding  

## 2016-09-03 DIAGNOSIS — C84A0 Cutaneous T-cell lymphoma, unspecified, unspecified site: Secondary | ICD-10-CM | POA: Diagnosis not present

## 2016-09-03 DIAGNOSIS — Z79899 Other long term (current) drug therapy: Secondary | ICD-10-CM | POA: Diagnosis not present

## 2016-09-08 DIAGNOSIS — M1711 Unilateral primary osteoarthritis, right knee: Secondary | ICD-10-CM | POA: Diagnosis not present

## 2016-09-13 ENCOUNTER — Encounter: Payer: Self-pay | Admitting: Internal Medicine

## 2016-09-15 ENCOUNTER — Other Ambulatory Visit: Payer: Self-pay | Admitting: Internal Medicine

## 2016-09-20 ENCOUNTER — Other Ambulatory Visit: Payer: Self-pay | Admitting: Internal Medicine

## 2016-09-27 DIAGNOSIS — E1122 Type 2 diabetes mellitus with diabetic chronic kidney disease: Secondary | ICD-10-CM | POA: Diagnosis not present

## 2016-09-27 DIAGNOSIS — I129 Hypertensive chronic kidney disease with stage 1 through stage 4 chronic kidney disease, or unspecified chronic kidney disease: Secondary | ICD-10-CM | POA: Diagnosis not present

## 2016-09-27 DIAGNOSIS — N183 Chronic kidney disease, stage 3 (moderate): Secondary | ICD-10-CM | POA: Diagnosis not present

## 2016-09-27 DIAGNOSIS — E876 Hypokalemia: Secondary | ICD-10-CM | POA: Diagnosis not present

## 2016-09-27 DIAGNOSIS — R809 Proteinuria, unspecified: Secondary | ICD-10-CM | POA: Diagnosis not present

## 2016-10-06 DIAGNOSIS — Z79899 Other long term (current) drug therapy: Secondary | ICD-10-CM | POA: Diagnosis not present

## 2016-10-06 DIAGNOSIS — C84A0 Cutaneous T-cell lymphoma, unspecified, unspecified site: Secondary | ICD-10-CM | POA: Diagnosis not present

## 2016-10-07 ENCOUNTER — Other Ambulatory Visit: Payer: Self-pay | Admitting: Internal Medicine

## 2016-10-15 ENCOUNTER — Other Ambulatory Visit: Payer: Self-pay | Admitting: Internal Medicine

## 2016-10-15 NOTE — Telephone Encounter (Signed)
Printed and faxed

## 2016-10-15 NOTE — Telephone Encounter (Signed)
Clorazepate Refilled on 03/16/2016  Gabapentin Refilled on 07/13/2016  Last Ov: 07/23/2016 Next Ov: 10/25/2016  Please advise.

## 2016-10-15 NOTE — Telephone Encounter (Signed)
Refilled for 30 days 

## 2016-10-22 ENCOUNTER — Other Ambulatory Visit: Payer: Self-pay | Admitting: Internal Medicine

## 2016-10-25 ENCOUNTER — Encounter: Payer: Self-pay | Admitting: Internal Medicine

## 2016-10-25 ENCOUNTER — Ambulatory Visit (INDEPENDENT_AMBULATORY_CARE_PROVIDER_SITE_OTHER): Payer: PPO | Admitting: Internal Medicine

## 2016-10-25 VITALS — BP 130/86 | HR 59 | Resp 17 | Ht 66.5 in | Wt 243.6 lb

## 2016-10-25 DIAGNOSIS — M1711 Unilateral primary osteoarthritis, right knee: Secondary | ICD-10-CM | POA: Diagnosis not present

## 2016-10-25 DIAGNOSIS — C84A0 Cutaneous T-cell lymphoma, unspecified, unspecified site: Secondary | ICD-10-CM

## 2016-10-25 DIAGNOSIS — E1121 Type 2 diabetes mellitus with diabetic nephropathy: Secondary | ICD-10-CM

## 2016-10-25 DIAGNOSIS — E6609 Other obesity due to excess calories: Secondary | ICD-10-CM | POA: Diagnosis not present

## 2016-10-25 DIAGNOSIS — I10 Essential (primary) hypertension: Secondary | ICD-10-CM

## 2016-10-25 DIAGNOSIS — Z6837 Body mass index (BMI) 37.0-37.9, adult: Secondary | ICD-10-CM | POA: Diagnosis not present

## 2016-10-25 DIAGNOSIS — E7800 Pure hypercholesterolemia, unspecified: Secondary | ICD-10-CM

## 2016-10-25 DIAGNOSIS — K8020 Calculus of gallbladder without cholecystitis without obstruction: Secondary | ICD-10-CM | POA: Diagnosis not present

## 2016-10-25 DIAGNOSIS — E119 Type 2 diabetes mellitus without complications: Secondary | ICD-10-CM | POA: Diagnosis not present

## 2016-10-25 DIAGNOSIS — IMO0001 Reserved for inherently not codable concepts without codable children: Secondary | ICD-10-CM

## 2016-10-25 MED ORDER — EMPAGLIFLOZIN 10 MG PO TABS: 10.0000 mg | ORAL_TABLET | Freq: Every day | ORAL | 0 refills | Status: DC

## 2016-10-25 NOTE — Progress Notes (Signed)
Pre visit review using our clinic review tool, if applicable. No additional management support is needed unless otherwise documented below in the visit note. 

## 2016-10-25 NOTE — Patient Instructions (Addendum)
Dannon Lt N Fit  AND  Oikos Triple Zero are the only low carb yogurts available that I recommend FOR SNACKS.  OK TO ADD A LITTLE WHIPPED CREAM   Your blood sugars are dropping because you are NOT EATING LUNCH So we are stopping glipizide and starting jardiancE  The starting dose is 10 mg daily for the first month,  The second month  DOSE is 25 mg daily if needed to control blood sugars  BETTER..  You should suspend your lasix for the first week or two and resume if you feel you are retaining fluid  Your blood pressure is much much better!!  KEEP UP THE  GREAT WORK!!

## 2016-10-25 NOTE — Progress Notes (Signed)
Subjective:  Patient ID: Daniel Kline, male    DOB: 1946-07-09  Age: 71 y.o. MRN: [id number]  CC: The primary encounter diagnosis was Diabetes mellitus without complication ([place]). 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  HPI Daniel Kline presents for follow up on hypertension, hyperlipidemia and diabetes  Recovering from a viral URI last week taht was acc by  body aches but no fevers or gi symptoms.  He is Using aleve and using mucinex,symptoms are improving.  Wife was sick contact,   Obesity:  Has gained 5 lbs  but has been swimming for an hour 3-4 days per week since last visit along with 100 pull ups every 20 laps in the water .  In spite of the exercise he has gained   5 lbs,  Has cut out potatoes. But still eating 2 slices of wheat bread and a biscuit at least 3 times per week.  Eats pound cake once a week.  Dm:  Type 2  .  At last visit, glipizide 5 mg bid was added.  He has had two episodes of low blood sugar , lowest was 54 , occurred after working out  In the early afternoon (had skipped lunch that day ) , 2nd one was  63 at 5:30 pm .  His schedule is erratic,  Sleeps till 10 any days days ,  Skips lunch 1 to 2 times per week  And goes o=to the gym in the afternoon even on those days.   Had injection of steroids into r knee jan 31 tryring to hold off on surgery        bp finally improved Weight up 5 lbs   Outpatient Medications Prior to Visit  Medication Sig Dispense Refill  . amLODipine (NORVASC) 10 MG tablet TAKE 1 TABLET BY MOUTH DAILY 90 tablet 0  . aspirin (ASPIR-81) 81 MG EC tablet Take 81 mg by mouth daily. Reported on 10/28/2015      . carvedilol (COREG) 3.125 MG tablet Take 1 tablet (3.125 mg total) by mouth 2 (two) times daily with a meal. 60 tablet 3  . Cholecalciferol (VITAMIN D-3) 1000 units CAPS Take 2 capsules by mouth daily. Reported on 02/27/2016    . clobetasol cream (TEMOVATE) 0.27 % Apply 1 application topically 2 (two) times daily.     . clorazepate (TRANXENE) 7.5 MG tablet TAKE ONE TABLET 3 TIMES DAILY AS NEEDED 90 tablet 3  . doxycycline (VIBRA-TABS) 100 MG tablet Take 1 tablet by mouth 2 (two) times daily.    Marland Kitchen EPINEPHrine (EPIPEN 2-PAK) 0.3 mg/0.3 mL IJ SOAJ injection Inject 0.3 mLs (0.3 mg total) into the muscle once. 1 Device 1  . famotidine (PEPCID) 20 MG tablet TAKE ONE TABLET BY MOUTH TWICE DAILY 60 tablet 0  . furosemide (LASIX) 20 MG tablet TAKE ONE TABLET EVERY MORNING 30 tablet 2  . gabapentin (NEURONTIN) 300 MG capsule TAKE 1 CAPSULE 3 TIMES DAILY 90 capsule 3  . hydrALAZINE (APRESOLINE) 50 MG tablet TAKE 1 & 1/2 TABLETS BY MOUTH 3 TIMES DAILY 120 tablet 0  . hydrOXYzine (ATARAX/VISTARIL) 25 MG tablet Take 25 mg by mouth every 8 (eight) hours as needed.     . INTRON A 6000000 UNIT/ML  injection     . latanoprost (XALATAN) 0.005 % ophthalmic solution Place 1 drop into the right eye at bedtime.    Marland Kitchen losartan (COZAAR) 100 MG tablet Take 1 tablet (100 mg total) by mouth daily. 90 tablet 3  . metFORMIN (GLUCOPHAGE) 500 MG tablet TAKE 1 TABLET BY MOUTH TWICE DAILY 60 tablet 5  . montelukast (SINGULAIR) 10 MG tablet TAKE ONE TABLET BY MOUTH AT BEDTIME 30 tablet 4  . Multiple Vitamins-Minerals (MULTIVITAL) tablet Take 1 tablet by mouth daily.      . ONE TOUCH ULTRA TEST test strip TEST once daily or as directed 100 each 1  . potassium chloride SA (K-DUR,KLOR-CON) 20 MEQ tablet TAKE 1 TABLET BY MOUTH DAILY 90 tablet 1  . glipiZIDE (GLUCOTROL) 5 MG tablet TAKE ONE TABLET TWICE DAILY BEFORE A MEAL 60 tablet 0  . folic acid (FOLVITE) 1 MG tablet TAKE ONE TABLET BY MOUTH EVERY DAY EXCEPT ON METHOTREXATE DAYS  (Patient not taking: Reported on 10/25/2016) 90 tablet 1   No facility-administered medications prior to visit.     Review of Systems;  Patient denies headache, fevers, malaise, unintentional weight loss, skin rash, eye pain, sinus congestion and sinus pain, sore throat, dysphagia,  hemoptysis , cough, dyspnea, wheezing, chest pain, palpitations, orthopnea, edema, abdominal pain, nausea, melena, diarrhea, constipation, flank pain, dysuria, hematuria, urinary  Frequency, nocturia, numbness, tingling, seizures,  Focal weakness, Loss of consciousness,  Tremor, insomnia, depression, anxiety, and suicidal ideation.      Objective:  BP 130/86 (BP Location: Left Arm, Patient Position: Sitting, Cuff Size: Large)   Pulse (!) 59   Resp 17   Ht 5' 6.5" (1.689 m)   Wt 243 lb 9.6 oz (110.5 kg)   SpO2 96%   BMI 38.73 kg/m   BP Readings from Last 3 Encounters:  10/25/16 130/86  07/23/16 (!) 162/82  07/20/16 138/78    Wt Readings from Last 3 Encounters:  10/25/16 243 lb 9.6 oz (110.5 kg)  07/23/16 238 lb 9.6 oz (108.2 kg)  07/20/16 238 lb 12.8 oz (108.3 kg)    General appearance: alert, cooperative and appears stated age Ears: normal TM's and external ear canals both ears Throat: lips, mucosa, and tongue normal; teeth and gums normal Neck: no adenopathy, no carotid bruit, supple, symmetrical, trachea midline and thyroid not enlarged, symmetric, no tenderness/mass/nodules Back: symmetric, no curvature. ROM normal. No CVA tenderness. Lungs: clear to auscultation bilaterally Heart: regular rate and rhythm, S1, S2 normal, no murmur, click, rub or gallop Abdomen: soft, non-tender; bowel sounds normal; no masses,  no organomegaly Pulses: 2+ and symmetric Skin: Skin color, texture, turgor normal. No rashes or lesions Lymph nodes: Cervical, supraclavicular, and axillary nodes normal.  Lab Results  Component Value Date   HGBA1C 6.7 10/26/2016   HGBA1C 7.6 (H) 07/23/2016   HGBA1C 7.6 (H)  01/28/2016    Lab Results  Component Value Date   CREATININE 1.48 07/23/2016   CREATININE 1.27 (H) 02/09/2016   CREATININE 1.34 01/28/2016    Lab Results  Component Value Date   WBC 7.8 11/28/2015   HGB 14.4 11/28/2015   HCT 43.6 11/28/2015   PLT 242 11/28/2015   GLUCOSE 180 (H) 07/23/2016   CHOL 166 07/23/2016   TRIG 178.0 (H) 07/23/2016   HDL 40.30 07/23/2016   LDLDIRECT 116.0 07/23/2016   LDLCALC 90 07/23/2016   ALT 28 07/23/2016   AST 22 07/23/2016   NA 141 07/23/2016   K 4.4 07/23/2016   CL 102 07/23/2016  CREATININE 1.48 07/23/2016   BUN 23 07/23/2016   CO2 32 07/23/2016   TSH 2.95 07/23/2016   PSA 1.60 09/24/2015   HGBA1C 6.7 10/26/2016   MICROALBUR 46.8 (H) 07/23/2016    US Renal  Result Date: 03/17/2016 CLINICAL DATA:  Question of renal mass EXAM: RENAL / URINARY TRACT ULTRASOUND COMPLETE COMPARISON:  None. FINDINGS: Right Kidney: Length: 9.6 cm. No hydronephrosis is seen. The echogenicity of the renal parenchyma is within normal limits. A hypoechoic structures noted laterally in the mid right kidney of 1.2 cm in diameter most consistent with cyst. Left Kidney: Length: 10.5 cm. No hydronephrosis is seen. A single non obstructing 6 mm calculus is noted. There is a hypoechoic structure in the lower pole of the left kidney laterally of 1.7 cm most consistent with cyst. No solid renal lesion is seen. Bladder: The urinary bladder is not well distended but no abnormality is noted. IMPRESSION: 1. No solid renal mass is seen. There appear to be single small cysts bilaterally. 2. No hydronephrosis. 3. Nonobstructing 6 mm left renal calculus. Electronically Signed   By: Ivar Drape M.D.   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 Supposed to be getting monthly cbc, cmet per last OV dec 2017      Relevant Medications   traMADol (ULTRAM) 50 MG tablet   Other Relevant Orders   CBC with Differential/Platelet   Diabetes mellitus with nephropathy ([place])    improved  control since last visit, but he is having recurrent hypoglycemia due to dietary nonadherence (SKIPPING LUNCH) .  Stopping glipzide and starting Jardiance 10 mg daily .  Diet reviewed and reduction in starches advised.  . Patient is up-to-date on eye exams and foot exam is normal today.. Patient is tolerating statin therapy for CAD risk reduction and on ACE/ARB for reduction in proteinuria.    Lab Results  Component Value Date   HGBA1C 6.7 10/26/2016    Lab Results  Component Value Date   MICROALBUR 46.8 (H) 07/23/2016           Relevant Medications   empagliflozin (JARDIANCE) 10 MG TABS tablet   Essential hypertension    Finally improving. Secondary causes including RAS ruled out,   No changes today      Relevant Orders   Comprehensive metabolic panel   Hypercholesterolemia    Using the Framingham risk calculator,  his 10 year risk of coronary artery disease is very elevated at 38%.  Will recommend statin therapy if next panel is not significantly improved.  Continue daily asa   Lab Results  Component Value Date   CHOL 166 07/23/2016   HDL 40.30 07/23/2016   LDLCALC 90 07/23/2016   LDLDIRECT 116.0 07/23/2016   TRIG 178.0 (H) 07/23/2016   CHOLHDL 4 07/23/2016         Relevant Orders   LDL cholesterol, direct   Lipid panel   Obesity    I have addressed  BMI and recommended portion size reduction and contiued adherence to a  low glycemic index diet utilizing smaller more frequent meals to increase metabolism.  I have also commended patient for his exercise regimen       Relevant Medications   empagliflozin  (JARDIANCE) 10 MG TABS tablet    Other Visit Diagnoses    Diabetes mellitus without complication ([place])    -  Primary   Relevant Medications   empagliflozin (JARDIANCE) 10 MG TABS tablet   Other Relevant Orders   POCT HgB A1C (Completed)   Hemoglobin A1c      I have discontinued Mr. Loadholt's folic acid and glipiZIDE. I am also having him start on empagliflozin. Additionally, I am having him maintain his aspirin, MULTIVITAL, clobetasol cream, doxycycline, hydrOXYzine, latanoprost, Vitamin D-3, EPINEPHrine, losartan, INTRON A, famotidine, potassium chloride SA, ONE TOUCH ULTRA TEST, metFORMIN, montelukast, carvedilol, amLODipine, furosemide, hydrALAZINE, gabapentin, clorazepate, and traMADol.  Meds ordered this encounter  Medications  . traMADol (ULTRAM) 50 MG tablet    Sig: Take 50 mg by mouth.  . empagliflozin (JARDIANCE) 10 MG TABS tablet    Sig: Take 10 mg by mouth daily.    Dispense:  30 tablet    Refill:  0    Medications Discontinued During This Encounter  Medication Reason  . folic acid (FOLVITE) 1 MG tablet Patient has not taken in last 30 days  . glipiZIDE (GLUCOTROL) 5 MG tablet     Follow-up: Return in about 1 month (around 11/25/2016), or FASTIGN LABS PRIOR TO NEXT APPT , 90+ DAYS .   Crecencio Mc, MD

## 2016-10-26 LAB — POCT GLYCOSYLATED HEMOGLOBIN (HGB A1C): Hemoglobin A1C: 6.7

## 2016-10-26 NOTE — Assessment & Plan Note (Addendum)
Using the Framingham risk calculator,  his 10 year risk of coronary artery disease is very elevated at 38%.  Will recommend statin therapy if next panel is not significantly improved.  Continue daily asa   Lab Results  Component Value Date   CHOL 166 07/23/2016   HDL 40.30 07/23/2016   LDLCALC 90 07/23/2016   LDLDIRECT 116.0 07/23/2016   TRIG 178.0 (H) 07/23/2016   CHOLHDL 4 07/23/2016

## 2016-10-26 NOTE — Assessment & Plan Note (Signed)
Surgery on right knee postponed for trial of steroid injections

## 2016-10-26 NOTE — Assessment & Plan Note (Signed)
managed by [facility] , last OV dec 2017. Supposed to be getting monthly cbc, cmet per last OV dec 2017

## 2016-10-26 NOTE — Assessment & Plan Note (Addendum)
Finally improving. Secondary causes including RAS ruled out,   No changes today

## 2016-10-26 NOTE — Assessment & Plan Note (Signed)
I have addressed  BMI and recommended portion size reduction and contiued adherence to a  low glycemic index diet utilizing smaller more frequent meals to increase metabolism.  I have also commended patient for his exercise regimen

## 2016-10-26 NOTE — Assessment & Plan Note (Addendum)
improved  control since last visit, but he is having recurrent hypoglycemia due to dietary nonadherence (SKIPPING LUNCH) .  Stopping glipzide and starting Jardiance 10 mg daily .  Diet reviewed and reduction in starches advised.  . Patient is up-to-date on eye exams and foot exam is normal today.. Patient is tolerating statin therapy for CAD risk reduction and on ACE/ARB for reduction in proteinuria.    Lab Results  Component Value Date   HGBA1C 6.7 10/26/2016    Lab Results  Component Value Date   MICROALBUR 46.8 (H) 07/23/2016

## 2016-10-26 NOTE — Assessment & Plan Note (Signed)
Noted on ct in 2015 again on [place] May 2017

## 2016-10-28 ENCOUNTER — Other Ambulatory Visit: Payer: Self-pay | Admitting: Internal Medicine

## 2016-11-03 ENCOUNTER — Ambulatory Visit (INDEPENDENT_AMBULATORY_CARE_PROVIDER_SITE_OTHER): Payer: PPO | Admitting: Family

## 2016-11-03 ENCOUNTER — Encounter: Payer: Self-pay | Admitting: Family

## 2016-11-03 VITALS — BP 178/95 | HR 86 | Temp 98.9°F | Ht 66.5 in | Wt 245.0 lb

## 2016-11-03 DIAGNOSIS — J40 Bronchitis, not specified as acute or chronic: Secondary | ICD-10-CM | POA: Diagnosis not present

## 2016-11-03 DIAGNOSIS — I10 Essential (primary) hypertension: Secondary | ICD-10-CM

## 2016-11-03 MED ORDER — HYDROCOD POLST-CPM POLST ER 10-8 MG/5ML PO SUER
5.0000 mL | Freq: Every evening | ORAL | 0 refills | Status: DC | PRN
Start: 2016-11-03 — End: 2018-01-09

## 2016-11-03 MED ORDER — DOXYCYCLINE HYCLATE 100 MG PO TABS: 100.0000 mg | ORAL_TABLET | Freq: Two times a day (BID) | ORAL | 0 refills | Status: DC

## 2016-11-03 NOTE — Progress Notes (Signed)
Subjective:    Patient ID: Daniel Kline, male    DOB: [birth year], 71 y.o.   MRN: [id number]  CC: GERHARD RAPPAPORT is a 71 y.o. male who presents today for an acute visit.    HPI: CC: dry cough x one month ago, however worsening over last week.  Endorses sneezing.  No wheezing, SOB, fever, sinus pressure, ear pain, sore throat.   Tried mucinex D with little relief. Usually tussionex helps.   No lung disease or smoking history  HTN- compliant with medication. Havent taken second dose of hydralazine today. Average  124/60.  Denies exertional chest pain or pressure, numbness or tingling radiating to left arm or jaw, palpitations, dizziness, frequent headaches, changes in vision, or shortness of breath.      HISTORY:  Past Medical History:  Diagnosis Date  . Arthritis   . Diabetes mellitus without complication ([place])   . HTN (hypertension)   . MRSA (methicillin resistant Staphylococcus aureus)   . Polio    Past Surgical History:  Procedure Laterality Date  . right elbow surgery    . right knee surgery    . right shoulder surgery    . spleenectomy    . TOTAL HIP ARTHROPLASTY  04/2016   No family history on file.  Allergies: Oxycodone and Zolpidem Current Outpatient Prescriptions on File Prior to Visit  Medication Sig Dispense Refill  . amLODipine (NORVASC) 10 MG tablet TAKE 1 TABLET BY MOUTH DAILY 90 tablet 0  . aspirin (ASPIR-81) 81 MG EC tablet Take 81 mg by mouth daily. Reported on 10/28/2015    . carvedilol (COREG) 3.125 MG tablet Take 1 tablet (3.125 mg total) by mouth 2 (two) times daily with a meal. 60 tablet 3  . Cholecalciferol (VITAMIN D-3) 1000 units CAPS Take 2 capsules by mouth daily. Reported on 02/27/2016    . clobetasol cream (TEMOVATE) 6.78 % Apply 1 application topically 2 (two) times daily.     . clorazepate (TRANXENE) 7.5 MG tablet TAKE ONE TABLET 3 TIMES DAILY AS NEEDED 90 tablet 3  . empagliflozin (JARDIANCE) 10 MG TABS tablet Take 10 mg by mouth  daily. 30 tablet 0  . EPINEPHrine (EPIPEN 2-PAK) 0.3 mg/0.3 mL IJ SOAJ injection Inject 0.3 mLs (0.3 mg total) into the muscle once. 1 Device 1  . famotidine (PEPCID) 20 MG tablet TAKE 1 TABLET BY MOUTH TWICE DAILY 60 tablet 5  . furosemide (LASIX) 20 MG tablet TAKE ONE TABLET EVERY MORNING 30 tablet 2  . gabapentin (NEURONTIN) 300 MG capsule TAKE 1 CAPSULE 3 TIMES DAILY 90 capsule 3  . hydrALAZINE (APRESOLINE) 50 MG tablet TAKE 1 & 1/2 TABLETS BY MOUTH 3 TIMES DAILY 120 tablet 0  . hydrOXYzine (ATARAX/VISTARIL) 25 MG tablet Take 25 mg by mouth every 8 (eight) hours as needed.     . INTRON A 6000000 UNIT/ML injection     . latanoprost (XALATAN) 0.005 % ophthalmic solution Place 1 drop into the right eye at bedtime.    Marland Kitchen losartan (COZAAR) 100 MG tablet Take 1 tablet (100 mg total) by mouth daily. 90 tablet 3  . metFORMIN (GLUCOPHAGE) 500 MG tablet TAKE 1 TABLET BY MOUTH TWICE DAILY 60 tablet 5  . montelukast (SINGULAIR) 10 MG tablet TAKE ONE TABLET BY MOUTH AT BEDTIME 30 tablet 4  . Multiple Vitamins-Minerals (MULTIVITAL) tablet Take 1 tablet by mouth daily.      . ONE TOUCH ULTRA TEST test strip TEST once daily or as directed 100 each 1  .  potassium chloride SA (K-DUR,KLOR-CON) 20 MEQ tablet TAKE 1 TABLET BY MOUTH DAILY 90 tablet 1  . traMADol (ULTRAM) 50 MG tablet Take 50 mg by mouth.     No current facility-administered medications on file prior to visit.     Social History  Substance Use Topics  . Smoking status: Former Smoker    Quit date: 09/23/1990  . Smokeless tobacco: Former Systems developer    Quit date: 02/25/2006     Comment: tobacco use- no   . Alcohol use No    Review of Systems  Constitutional: Negative for chills and fever.  HENT: Negative for congestion, sinus pressure and sore throat.   Eyes: Negative for visual disturbance.  Respiratory: Positive for cough. Negative for shortness of breath and wheezing.   Cardiovascular: Negative for chest pain and palpitations.    Gastrointestinal: Negative for nausea and vomiting.  Neurological: Negative for headaches.      Objective:    BP (!) 178/95   Pulse 86   Temp 98.9 F (37.2 C) (Oral)   Ht 5' 6.5" (1.689 m)   Wt 245 lb (111.1 kg)   SpO2 94%   BMI 38.95 kg/m   BP Readings from Last 3 Encounters:  11/03/16 (!) 178/95  10/25/16 130/86  07/23/16 (!) 162/82    Physical Exam  Constitutional: Vital signs are normal. He appears well-developed and well-nourished.  HENT:  Head: Normocephalic and atraumatic.  Right Ear: Hearing, tympanic membrane, external ear and ear canal normal. No drainage, swelling or tenderness. Tympanic membrane is not injected, not erythematous and not bulging. No middle ear effusion. No decreased hearing is noted.  Left Ear: Hearing, tympanic membrane, external ear and ear canal normal. No drainage, swelling or tenderness. Tympanic membrane is not injected, not erythematous and not bulging.  No middle ear effusion. No decreased hearing is noted.  Nose: Nose normal. Right sinus exhibits no maxillary sinus tenderness and no frontal sinus tenderness. Left sinus exhibits no maxillary sinus tenderness and no frontal sinus tenderness.  Mouth/Throat: Uvula is midline, oropharynx is clear and moist and mucous membranes are normal. No oropharyngeal exudate, posterior oropharyngeal edema, posterior oropharyngeal erythema or tonsillar abscesses.  Eyes: Conjunctivae are normal.  Cardiovascular: Regular rhythm and normal heart sounds.   Pulmonary/Chest: Effort normal and breath sounds normal. No respiratory distress. He has no wheezes. He has no rhonchi. He has no rales.  Lymphadenopathy:       Head (right side): No submental, no submandibular, no tonsillar, no preauricular, no posterior auricular and no occipital adenopathy present.       Head (left side): No submental, no submandibular, no tonsillar, no preauricular, no posterior auricular and no occipital adenopathy present.    He has no  cervical adenopathy.  Neurological: He is alert.  Skin: Skin is warm and dry.  Psychiatric: He has a normal mood and affect. His speech is normal and behavior is normal.  Vitals reviewed.      Assessment & Plan:      I have discontinued Mr. Throgmorton's doxycycline. I am also having him start on doxycycline and chlorpheniramine-HYDROcodone. Additionally, I am having him maintain his aspirin, MULTIVITAL, clobetasol cream, hydrOXYzine, latanoprost, Vitamin D-3, EPINEPHrine, losartan, INTRON A, potassium chloride SA, ONE TOUCH ULTRA TEST, metFORMIN, montelukast, carvedilol, amLODipine, furosemide, hydrALAZINE, gabapentin, clorazepate, traMADol, empagliflozin, and famotidine.   Meds ordered this encounter  Medications  . doxycycline (VIBRA-TABS) 100 MG tablet    Sig: Take 1 tablet (100 mg total) by mouth 2 (two) times daily.  Dispense:  10 tablet    Refill:  0    Order Specific Question:   Supervising Provider    Answer:   Deborra Medina L [[id number]]  . chlorpheniramine-HYDROcodone (TUSSIONEX PENNKINETIC ER) 10-8 MG/5ML SUER    Sig: Take 5 mLs by mouth at bedtime as needed for cough.    Dispense:  70 mL    Refill:  0    Order Specific Question:   Supervising Provider    Answer:   Crecencio Mc [[id number]]    Return precautions given.   Risks, benefits, and alternatives of the medications and treatment plan prescribed today were discussed, and patient expressed understanding.   Education regarding symptom management and diagnosis given to patient on AVS.  Continue to follow with TULLO, Aris Everts, MD for routine health maintenance.   Marco Collie and I agreed with plan.   Mable Paris, FNP

## 2016-11-03 NOTE — Patient Instructions (Signed)
Suspect bronchitis  Please monitor your blood pressure very carefully as concerned how high it is today  Please take your hydralazine as soon as you get home and check your blood pressure.   If > 140/90, let us know  If any signs of stroke of heart attack, call [phone]  Please take cough medication at night only as needed. As we discussed, I do not recommend dosing throughout the day as coughing is a protective mechanism . It also helps to break up thick mucous.  Do not take cough suppressants with alcohol as can lead to trouble breathing. Advise caution if taking cough suppressant and operating machinery ( i.e driving a car) as you may feel very tired.    If there is no improvement in your symptoms, or if there is any worsening of symptoms, or if you have any additional concerns, please return for re-evaluation; or, if we are closed, consider going to the Emergency Room for evaluation if symptoms urgent.

## 2016-11-03 NOTE — Progress Notes (Signed)
Pre visit review using our clinic review tool, if applicable. No additional management support is needed unless otherwise documented below in the visit note. 

## 2016-11-04 ENCOUNTER — Encounter: Payer: Self-pay | Admitting: Family

## 2016-11-04 DIAGNOSIS — J40 Bronchitis, not specified as acute or chronic: Secondary | ICD-10-CM | POA: Insufficient documentation

## 2016-11-04 NOTE — Assessment & Plan Note (Signed)
Elevated. No symptoms. Has missed second dose of hydralazine today. Politely declined augmenting BP regimen as planned to take his hydralazine when he gets home. Education provided on importance of watching BP and STOP Mucinex-D as likely contributing. Education provided on stroke and heart attack symptoms.

## 2016-11-04 NOTE — Assessment & Plan Note (Signed)
Afebrile. No acute respiratory distress. Based on duration of symptoms, patient and I jointly decided to start antibiotic. Given cough medication as needed.

## 2016-11-04 NOTE — Progress Notes (Signed)
Called patient and checked on him blood pressure last night was 142/61.

## 2016-11-05 DIAGNOSIS — C84A0 Cutaneous T-cell lymphoma, unspecified, unspecified site: Secondary | ICD-10-CM | POA: Diagnosis not present

## 2016-11-05 DIAGNOSIS — Z79899 Other long term (current) drug therapy: Secondary | ICD-10-CM | POA: Diagnosis not present

## 2016-11-08 ENCOUNTER — Other Ambulatory Visit: Payer: Self-pay | Admitting: Internal Medicine

## 2016-11-09 NOTE — Telephone Encounter (Signed)
Tramadol refilled, please print

## 2016-11-09 NOTE — Telephone Encounter (Signed)
Refilled: 09/08/2016 Last OV: 10/25/2016 Next OV: 01/28/2017

## 2016-11-10 ENCOUNTER — Other Ambulatory Visit: Payer: Self-pay | Admitting: Internal Medicine

## 2016-11-10 ENCOUNTER — Encounter: Payer: Self-pay | Admitting: Family

## 2016-11-10 ENCOUNTER — Telehealth: Payer: Self-pay | Admitting: Internal Medicine

## 2016-11-10 ENCOUNTER — Ambulatory Visit (INDEPENDENT_AMBULATORY_CARE_PROVIDER_SITE_OTHER): Payer: PPO | Admitting: Family

## 2016-11-10 VITALS — BP 170/95 | HR 67 | Temp 97.7°F | Ht 66.5 in | Wt 238.0 lb

## 2016-11-10 DIAGNOSIS — I10 Essential (primary) hypertension: Secondary | ICD-10-CM

## 2016-11-10 DIAGNOSIS — J40 Bronchitis, not specified as acute or chronic: Secondary | ICD-10-CM

## 2016-11-10 MED ORDER — DOXYCYCLINE HYCLATE 100 MG PO TABS: 100.0000 mg | ORAL_TABLET | Freq: Two times a day (BID) | ORAL | 0 refills | Status: DC

## 2016-11-10 MED ORDER — ALBUTEROL SULFATE HFA 108 (90 BASE) MCG/ACT IN AERS: 2.0000 | INHALATION_SPRAY | Freq: Four times a day (QID) | RESPIRATORY_TRACT | 1 refills | Status: DC | PRN

## 2016-11-10 MED ORDER — TRAMADOL HCL 50 MG PO TABS: ORAL_TABLET | ORAL | 5 refills | Status: DC

## 2016-11-10 NOTE — Assessment & Plan Note (Addendum)
Modest improvement on doxycycline. Will do one more week of antibiotic. Afebrile, no acute distress or adventitious lung sounds. Advised plain mucinex and albuterol inhaler. Return precautions given

## 2016-11-10 NOTE — Progress Notes (Signed)
Pre visit review using our clinic review tool, if applicable. No additional management support is needed unless otherwise documented below in the visit note. 

## 2016-11-10 NOTE — Assessment & Plan Note (Signed)
Elevated today. No CP, SOB. Patient feels well. Hasnt taken anti hypertensives this morning and will do so as soon as he gets home. He will also call the office with his BP reading once home.

## 2016-11-10 NOTE — Progress Notes (Signed)
Subjective:    Patient ID: Daniel Kline, male    DOB: 1946-01-29, 71 y.o.   MRN: [id number]  CC: Daniel Kline is a 71 y.o. male who presents today for follow up.   HPI:  here for reevaluation for cough,  'not much better' for past 2 weeks. Occasional wheezing. No SOB, sinus pain, ear pain, fever. He was seen by myself 6 days ago and given tussionex, doxycycline with some improvement.  Feel like he needs to break up congestion. Not taking any over the counter medications.   HTN- 144/68 yesterday. Hasn't taken blood pressure medications this morning. Denies exertional chest pain or pressure, numbness or tingling radiating to left arm or jaw, palpitations, dizziness, frequent headaches, changes in vision, or shortness of breath.         HISTORY:  Past Medical History:  Diagnosis Date  . Arthritis   . Diabetes mellitus without complication ([place])   . HTN (hypertension)   . MRSA (methicillin resistant Staphylococcus aureus)   . Polio    Past Surgical History:  Procedure Laterality Date  . right elbow surgery    . right knee surgery    . right shoulder surgery    . spleenectomy    . TOTAL HIP ARTHROPLASTY  04/2016   No family history on file.  Allergies: Oxycodone and Zolpidem Current Outpatient Prescriptions on File Prior to Visit  Medication Sig Dispense Refill  . amLODipine (NORVASC) 10 MG tablet TAKE 1 TABLET BY MOUTH DAILY 90 tablet 0  . aspirin (ASPIR-81) 81 MG EC tablet Take 81 mg by mouth daily. Reported on 10/28/2015    . carvedilol (COREG) 3.125 MG tablet Take 1 tablet (3.125 mg total) by mouth 2 (two) times daily with a meal. 60 tablet 3  . chlorpheniramine-HYDROcodone (TUSSIONEX PENNKINETIC ER) 10-8 MG/5ML SUER Take 5 mLs by mouth at bedtime as needed for cough. 70 mL 0  . Cholecalciferol (VITAMIN D-3) 1000 units CAPS Take 2 capsules by mouth daily. Reported on 02/27/2016    . clobetasol cream (TEMOVATE) 6.26 % Apply 1 application topically 2 (two) times daily.      . clorazepate (TRANXENE) 7.5 MG tablet TAKE ONE TABLET 3 TIMES DAILY AS NEEDED 90 tablet 3  . empagliflozin (JARDIANCE) 10 MG TABS tablet Take 10 mg by mouth daily. 30 tablet 0  . EPINEPHrine (EPIPEN 2-PAK) 0.3 mg/0.3 mL IJ SOAJ injection Inject 0.3 mLs (0.3 mg total) into the muscle once. 1 Device 1  . famotidine (PEPCID) 20 MG tablet TAKE 1 TABLET BY MOUTH TWICE DAILY 60 tablet 5  . furosemide (LASIX) 20 MG tablet TAKE ONE TABLET EVERY MORNING 30 tablet 2  . gabapentin (NEURONTIN) 300 MG capsule TAKE 1 CAPSULE 3 TIMES DAILY 90 capsule 3  . hydrALAZINE (APRESOLINE) 50 MG tablet TAKE 1 & 1/2 TABLETS BY MOUTH 3 TIMES DAILY 120 tablet 0  . hydrOXYzine (ATARAX/VISTARIL) 25 MG tablet Take 25 mg by mouth every 8 (eight) hours as needed.     . INTRON A 6000000 UNIT/ML injection     . latanoprost (XALATAN) 0.005 % ophthalmic solution Place 1 drop into the right eye at bedtime.    Marland Kitchen losartan (COZAAR) 100 MG tablet Take 1 tablet (100 mg total) by mouth daily. 90 tablet 3  . metFORMIN (GLUCOPHAGE) 500 MG tablet TAKE 1 TABLET BY MOUTH TWICE DAILY 60 tablet 5  . montelukast (SINGULAIR) 10 MG tablet TAKE ONE TABLET BY MOUTH AT BEDTIME 30 tablet 4  . Multiple Vitamins-Minerals (  MULTIVITAL) tablet Take 1 tablet by mouth daily.      . ONE TOUCH ULTRA TEST test strip TEST once daily or as directed 100 each 1  . potassium chloride SA (K-DUR,KLOR-CON) 20 MEQ tablet TAKE 1 TABLET BY MOUTH DAILY 90 tablet 1  . traMADol (ULTRAM) 50 MG tablet TAKE ONE TABLET EVERY EIGHT HOURS AS NEEDED 90 tablet 5   No current facility-administered medications on file prior to visit.     Social History  Substance Use Topics  . Smoking status: Former Smoker    Quit date: 09/23/1990  . Smokeless tobacco: Former Systems developer    Quit date: 02/25/2006     Comment: tobacco use- no   . Alcohol use No    Review of Systems  Constitutional: Negative for chills and fever.  HENT: Positive for congestion. Negative for sinus pressure and sore  throat.   Eyes: Negative for visual disturbance.  Respiratory: Positive for cough and wheezing. Negative for shortness of breath.   Cardiovascular: Negative for chest pain and palpitations.  Gastrointestinal: Negative for nausea and vomiting.  Neurological: Negative for dizziness.      Objective:    BP (!) 170/95   Pulse 67   Temp 97.7 F (36.5 C) (Oral)   Ht 5' 6.5" (1.689 m)   Wt 238 lb (108 kg)   SpO2 94%   BMI 37.84 kg/m  BP Readings from Last 3 Encounters:  11/10/16 (!) 170/95  11/03/16 (!) 178/95  10/25/16 130/86   Wt Readings from Last 3 Encounters:  11/10/16 238 lb (108 kg)  11/03/16 245 lb (111.1 kg)  10/25/16 243 lb 9.6 oz (110.5 kg)    Physical Exam  Constitutional: Vital signs are normal. He appears well-developed and well-nourished.  HENT:  Head: Normocephalic and atraumatic.  Right Ear: Hearing, tympanic membrane, external ear and ear canal normal. No drainage, swelling or tenderness. Tympanic membrane is not injected, not erythematous and not bulging. No middle ear effusion. No decreased hearing is noted.  Left Ear: Hearing, tympanic membrane, external ear and ear canal normal. No drainage, swelling or tenderness. Tympanic membrane is not injected, not erythematous and not bulging.  No middle ear effusion. No decreased hearing is noted.  Nose: Nose normal. Right sinus exhibits no maxillary sinus tenderness and no frontal sinus tenderness. Left sinus exhibits no maxillary sinus tenderness and no frontal sinus tenderness.  Mouth/Throat: Uvula is midline, oropharynx is clear and moist and mucous membranes are normal. No oropharyngeal exudate, posterior oropharyngeal edema, posterior oropharyngeal erythema or tonsillar abscesses.  Eyes: Conjunctivae are normal.  Cardiovascular: Regular rhythm and normal heart sounds.   Pulmonary/Chest: Effort normal and breath sounds normal. No respiratory distress. He has no wheezes. He has no rhonchi. He has no rales.    Lymphadenopathy:       Head (right side): No submental, no submandibular, no tonsillar, no preauricular, no posterior auricular and no occipital adenopathy present.       Head (left side): No submental, no submandibular, no tonsillar, no preauricular, no posterior auricular and no occipital adenopathy present.    He has no cervical adenopathy.  Neurological: He is alert.  Skin: Skin is warm and dry.  Psychiatric: He has a normal mood and affect. His speech is normal and behavior is normal.  Vitals reviewed.      Assessment & Plan:   Problem List Items Addressed This Visit      Cardiovascular and Mediastinum   Essential hypertension    Elevated today. No CP,  SOB. Patient feels well. Hasnt taken anti hypertensives this morning and will do so as soon as he gets home. He will also call the office with his BP reading once home.         Respiratory   Bronchitis - Primary    Modest improvement on doxycycline. Will do one more week of antibiotic. Afebrile, no acute distress or adventitious lung sounds. Advised plain mucinex and albuterol inhaler. Return precautions given      Relevant Medications   doxycycline (VIBRA-TABS) 100 MG tablet   albuterol (PROVENTIL HFA) 108 (90 Base) MCG/ACT inhaler       I am having Daniel Kline start on albuterol. I am also having him maintain his aspirin, MULTIVITAL, clobetasol cream, hydrOXYzine, latanoprost, Vitamin D-3, EPINEPHrine, losartan, INTRON A, potassium chloride SA, ONE TOUCH ULTRA TEST, metFORMIN, montelukast, carvedilol, amLODipine, furosemide, hydrALAZINE, gabapentin, clorazepate, empagliflozin, famotidine, chlorpheniramine-HYDROcodone, traMADol, and doxycycline.   Meds ordered this encounter  Medications  . doxycycline (VIBRA-TABS) 100 MG tablet    Sig: Take 1 tablet (100 mg total) by mouth 2 (two) times daily.    Dispense:  10 tablet    Refill:  0    Order Specific Question:   Supervising Provider    Answer:   Deborra Medina L [[id number]]   . albuterol (PROVENTIL HFA) 108 (90 Base) MCG/ACT inhaler    Sig: Inhale 2 puffs into the lungs every 6 (six) hours as needed for wheezing or shortness of breath.    Dispense:  1 Inhaler    Refill:  1    Order Specific Question:   Supervising Provider    Answer:   Crecencio Mc [[id number]]    Return precautions given.   Risks, benefits, and alternatives of the medications and treatment plan prescribed today were discussed, and patient expressed understanding.   Education regarding symptom management and diagnosis given to patient on AVS.  Continue to follow with TULLO, Aris Everts, MD for routine health maintenance.   Daniel Kline and I agreed with plan.   Mable Paris, FNP

## 2016-11-10 NOTE — Telephone Encounter (Signed)
Pt called and left a voicemail stating that his bp is now 152/78. Please advise, thank you!  Call pt @ [phone]

## 2016-11-10 NOTE — Patient Instructions (Signed)
Start PLAIN mucinex to help break up congestion- LOTS of water with this medication.  Use albuterol every 6 hours for first 24 hours to get good medication into the lungs and loosen congestion; after, you may use as needed and eventually stop all together when cough resolves.  If you go for light exercise, I would use your inhaler prior to your work out.   Call me with blood pressure when you get home after medication. Goal is < 140/90.   Let me know if you don't round the corner

## 2016-11-10 NOTE — Telephone Encounter (Signed)
Printed, signed and faxed.  

## 2016-11-10 NOTE — Telephone Encounter (Signed)
FYI

## 2016-11-11 NOTE — Telephone Encounter (Signed)
Call pt-  BP improved which I am happy to see.   If persistently > 140/90. He needs to notify PCP.

## 2016-11-11 NOTE — Telephone Encounter (Signed)
Patient 's wife has been notified  

## 2016-11-12 ENCOUNTER — Other Ambulatory Visit: Payer: Self-pay | Admitting: Family

## 2016-11-12 DIAGNOSIS — J40 Bronchitis, not specified as acute or chronic: Secondary | ICD-10-CM

## 2016-11-19 ENCOUNTER — Telehealth: Payer: Self-pay | Admitting: *Deleted

## 2016-11-19 ENCOUNTER — Other Ambulatory Visit: Payer: Self-pay | Admitting: Internal Medicine

## 2016-11-19 DIAGNOSIS — I10 Essential (primary) hypertension: Secondary | ICD-10-CM

## 2016-11-19 MED ORDER — HYDRALAZINE HCL 50 MG PO TABS
50.0000 mg | ORAL_TABLET | Freq: Three times a day (TID) | ORAL | 1 refills | Status: DC
Start: 2016-11-19 — End: 2017-01-04

## 2016-11-19 MED ORDER — HYDRALAZINE HCL 25 MG PO TABS: 25.0000 mg | ORAL_TABLET | Freq: Three times a day (TID) | ORAL | 1 refills | Status: DC

## 2016-11-19 NOTE — Telephone Encounter (Signed)
Spoke with pharmacy and Dr. Derrel Nip. The correct dose has been figured out and sent in.

## 2016-11-19 NOTE — Telephone Encounter (Signed)
[facility] has requested a verification for hydralazine [facility] Contact [phone]

## 2016-11-23 ENCOUNTER — Other Ambulatory Visit: Payer: Self-pay | Admitting: Internal Medicine

## 2016-11-24 DIAGNOSIS — M1711 Unilateral primary osteoarthritis, right knee: Secondary | ICD-10-CM | POA: Diagnosis not present

## 2016-12-02 DIAGNOSIS — E119 Type 2 diabetes mellitus without complications: Secondary | ICD-10-CM | POA: Diagnosis not present

## 2016-12-02 DIAGNOSIS — I10 Essential (primary) hypertension: Secondary | ICD-10-CM | POA: Diagnosis not present

## 2016-12-02 DIAGNOSIS — C8400 Mycosis fungoides, unspecified site: Secondary | ICD-10-CM | POA: Diagnosis not present

## 2016-12-07 DIAGNOSIS — Z79899 Other long term (current) drug therapy: Secondary | ICD-10-CM | POA: Diagnosis not present

## 2016-12-07 DIAGNOSIS — C84A0 Cutaneous T-cell lymphoma, unspecified, unspecified site: Secondary | ICD-10-CM | POA: Diagnosis not present

## 2016-12-14 ENCOUNTER — Other Ambulatory Visit: Payer: Self-pay | Admitting: Internal Medicine

## 2016-12-30 ENCOUNTER — Other Ambulatory Visit: Payer: Self-pay | Admitting: Internal Medicine

## 2017-01-04 ENCOUNTER — Other Ambulatory Visit: Payer: Self-pay | Admitting: Internal Medicine

## 2017-01-10 ENCOUNTER — Other Ambulatory Visit: Payer: Self-pay | Admitting: Internal Medicine

## 2017-01-10 DIAGNOSIS — C84A0 Cutaneous T-cell lymphoma, unspecified, unspecified site: Secondary | ICD-10-CM | POA: Diagnosis not present

## 2017-01-10 DIAGNOSIS — Z79899 Other long term (current) drug therapy: Secondary | ICD-10-CM | POA: Diagnosis not present

## 2017-01-25 ENCOUNTER — Other Ambulatory Visit: Payer: Self-pay

## 2017-01-25 DIAGNOSIS — Z79899 Other long term (current) drug therapy: Secondary | ICD-10-CM | POA: Diagnosis not present

## 2017-01-25 DIAGNOSIS — C8400 Mycosis fungoides, unspecified site: Secondary | ICD-10-CM | POA: Diagnosis not present

## 2017-01-25 DIAGNOSIS — C84A0 Cutaneous T-cell lymphoma, unspecified, unspecified site: Secondary | ICD-10-CM | POA: Diagnosis not present

## 2017-01-26 DIAGNOSIS — E119 Type 2 diabetes mellitus without complications: Secondary | ICD-10-CM | POA: Diagnosis not present

## 2017-01-26 DIAGNOSIS — M1711 Unilateral primary osteoarthritis, right knee: Secondary | ICD-10-CM | POA: Diagnosis not present

## 2017-01-27 ENCOUNTER — Other Ambulatory Visit (INDEPENDENT_AMBULATORY_CARE_PROVIDER_SITE_OTHER): Payer: PPO

## 2017-01-27 DIAGNOSIS — E119 Type 2 diabetes mellitus without complications: Secondary | ICD-10-CM

## 2017-01-27 DIAGNOSIS — C84A0 Cutaneous T-cell lymphoma, unspecified, unspecified site: Secondary | ICD-10-CM

## 2017-01-27 DIAGNOSIS — E7800 Pure hypercholesterolemia, unspecified: Secondary | ICD-10-CM

## 2017-01-27 DIAGNOSIS — I10 Essential (primary) hypertension: Secondary | ICD-10-CM

## 2017-01-27 LAB — COMPREHENSIVE METABOLIC PANEL
ALT: 21 U/L (ref 0–53)
AST: 16 U/L (ref 0–37)
Albumin: 4.1 g/dL (ref 3.5–5.2)
Alkaline Phosphatase: 53 U/L (ref 39–117)
BUN: 27 mg/dL — AB (ref 6–23)
CHLORIDE: 104 meq/L (ref 96–112)
CO2: 27 mEq/L (ref 19–32)
Calcium: 9.6 mg/dL (ref 8.4–10.5)
Creatinine, Ser: 1.43 mg/dL (ref 0.40–1.50)
GFR: 51.61 mL/min — ABNORMAL LOW (ref >60.00)
GLUCOSE: 132 mg/dL — AB (ref 70–99)
POTASSIUM: 4.2 meq/L (ref 3.5–5.1)
SODIUM: 137 meq/L (ref 135–145)
TOTAL PROTEIN: 6.6 g/dL (ref 6.0–8.3)
Total Bilirubin: 0.5 mg/dL (ref 0.2–1.2)

## 2017-01-27 LAB — CBC WITH DIFFERENTIAL/PLATELET
BASOS PCT: 0.8 % (ref 0.0–3.0)
Basophils Absolute: 0 10*3/uL (ref 0.0–0.1)
EOS PCT: 1 % (ref 0.0–5.0)
Eosinophils Absolute: 0.1 10*3/uL (ref 0.0–0.7)
HCT: 43.7 % (ref 39.0–52.0)
Hemoglobin: 14.2 g/dL (ref 13.0–17.0)
LYMPHS ABS: 1.3 10*3/uL (ref 0.7–4.0)
Lymphocytes Relative: 23.4 % (ref 12.0–46.0)
MCHC: 32.5 g/dL (ref 30.0–36.0)
MCV: 88.8 fl (ref 78.0–100.0)
MONOS PCT: 12.6 % — AB (ref 3.0–12.0)
Monocytes Absolute: 0.7 10*3/uL (ref 0.1–1.0)
NEUTROS ABS: 3.4 10*3/uL (ref 1.4–7.7)
NEUTROS PCT: 62.2 % (ref 43.0–77.0)
Platelets: 175 10*3/uL (ref 150.0–400.0)
RBC: 4.92 Mil/uL (ref 4.22–5.81)
RDW: 16.6 % — ABNORMAL HIGH (ref 11.5–15.5)
WBC: 5.5 10*3/uL (ref 4.0–10.5)

## 2017-01-27 LAB — LIPID PANEL
Cholesterol: 147 mg/dL (ref 0–200)
HDL: 37.9 mg/dL — AB (ref >39.00)
LDL Cholesterol: 83 mg/dL (ref 0–99)
NONHDL: 108.73
TRIGLYCERIDES: 128 mg/dL (ref 0.0–149.0)
Total CHOL/HDL Ratio: 4
VLDL: 25.6 mg/dL (ref 0.0–40.0)

## 2017-01-27 LAB — LDL CHOLESTEROL, DIRECT: Direct LDL: 94 mg/dL

## 2017-01-27 LAB — HEMOGLOBIN A1C: Hgb A1c MFr Bld: 7.1 % — ABNORMAL HIGH (ref 4.6–6.5)

## 2017-01-28 ENCOUNTER — Encounter: Payer: Self-pay | Admitting: Internal Medicine

## 2017-01-28 ENCOUNTER — Ambulatory Visit (INDEPENDENT_AMBULATORY_CARE_PROVIDER_SITE_OTHER): Payer: PPO | Admitting: Internal Medicine

## 2017-01-28 DIAGNOSIS — E1121 Type 2 diabetes mellitus with diabetic nephropathy: Secondary | ICD-10-CM | POA: Diagnosis not present

## 2017-01-28 DIAGNOSIS — M1711 Unilateral primary osteoarthritis, right knee: Secondary | ICD-10-CM

## 2017-01-28 DIAGNOSIS — I10 Essential (primary) hypertension: Secondary | ICD-10-CM

## 2017-01-28 MED ORDER — HYDROCODONE-ACETAMINOPHEN 5-325 MG PO TABS: 1.0000 | ORAL_TABLET | Freq: Two times a day (BID) | ORAL | 0 refills | Status: DC | PRN

## 2017-01-28 MED ORDER — HYDROXYZINE HCL 25 MG PO TABS: 25.0000 mg | ORAL_TABLET | Freq: Three times a day (TID) | ORAL | 3 refills | Status: DC | PRN

## 2017-01-28 NOTE — Progress Notes (Signed)
Subjective:  Patient ID: Daniel Kline, male    DOB: 1946-06-28  Age: 71 y.o. MRN: [id number]  CC: Diagnoses of Primary osteoarthritis of right knee, Diabetes mellitus with nephropathy ([place]), and Essential hypertension were pertinent to this visit.  HPI Daniel Kline presents for 3 month follow up on diabetes.  Patient has no complaints today.  Patient is following a low glycemic index diet and taking all prescribed medications regularly without side effects.  Fasting sugars have been under less than 140 most of the time and post prandials have been under 160 except on rare occasions. Patient is swimming 3 times per week and intentionally trying to lose weight .  Patient has had an eye exam in the last 12 months and checks feet regularly for signs of infection.  Patient does not walk barefoot outside,  And denies an numbness tingling or burning in feet. Patient is up to date on all recommended vaccinations  Right Knee replacement planned for July 5 tentatively at [facility] in [facility].   preoper medical clearance done today .   Home blood pressures are never over 130/72    Denies any history of chest pain .   Does not snore,  , but has daytime somnolence and has not had a sleep study   Taking 2 gabapentin,  2 tsp vinegar  And occasionally a tranxene  At bedtime . Wakes up refreshed  usimg hydroxyizin for itching  No longer using vicodin . Using tramadol with little relief.  Lab Results  Component Value Date   HGBA1C 7.1 (H) 01/27/2017     Outpatient Medications Prior to Visit  Medication Sig Dispense Refill  . albuterol (PROVENTIL HFA) 108 (90 Base) MCG/ACT inhaler Inhale 2 puffs into the lungs every 6 (six) hours as needed for wheezing or shortness of breath. 1 Inhaler 1  . amLODipine (NORVASC) 10 MG tablet TAKE 1 TABLET BY MOUTH DAILY 90 tablet 1  . aspirin (ASPIR-81) 81 MG EC tablet Take 81 mg by mouth daily. Reported on 10/28/2015    . carvedilol (COREG) 3.125 MG tablet TAKE  ONE TABLET BY MOUTH TWICE DAILY WITH A MEAL 60 tablet 4  . chlorpheniramine-HYDROcodone (TUSSIONEX PENNKINETIC ER) 10-8 MG/5ML SUER Take 5 mLs by mouth at bedtime as needed for cough. 70 mL 0  . Cholecalciferol (VITAMIN D-3) 1000 units CAPS Take 2 capsules by mouth daily. Reported on 02/27/2016    . clobetasol cream (TEMOVATE) 7.61 % Apply 1 application topically 2 (two) times daily.     . clorazepate (TRANXENE) 7.5 MG tablet TAKE ONE TABLET 3 TIMES DAILY AS NEEDED 90 tablet 3  . EPINEPHrine (EPIPEN 2-PAK) 0.3 mg/0.3 mL IJ SOAJ injection Inject 0.3 mLs (0.3 mg total) into the muscle once. 1 Device 1  . famotidine (PEPCID) 20 MG tablet TAKE 1 TABLET BY MOUTH TWICE DAILY 60 tablet 5  . furosemide (LASIX) 20 MG tablet TAKE ONE TABLET EVERY MORNING 30 tablet 3  . gabapentin (NEURONTIN) 300 MG capsule TAKE 1 CAPSULE 3 TIMES DAILY 90 capsule 3  . hydrALAZINE (APRESOLINE) 25 MG tablet Take 1 tablet (25 mg total) by mouth 3 (three) times daily. 90 tablet 1  . hydrALAZINE (APRESOLINE) 50 MG tablet TAKE ONE TABLET BY MOUTH 3 TIMES DAILY 90 tablet 1  . INTRON A 6000000 UNIT/ML injection     . JARDIANCE 10 MG TABS tablet TAKE ONE TABLET BY MOUTH EVERY DAY 90 tablet 1  . latanoprost (XALATAN) 0.005 % ophthalmic solution Place 1  drop into the right eye at bedtime.    Marland Kitchen losartan (COZAAR) 100 MG tablet TAKE 1 TABLET BY MOUTH DAILY 90 tablet 1  . metFORMIN (GLUCOPHAGE) 500 MG tablet TAKE ONE TABLET TWICE DAILY 60 tablet 0  . montelukast (SINGULAIR) 10 MG tablet TAKE ONE TABLET BY MOUTH AT BEDTIME 30 tablet 4  . Multiple Vitamins-Minerals (MULTIVITAL) tablet Take 1 tablet by mouth daily.      . ONE TOUCH ULTRA TEST test strip TEST once daily or as directed 100 each 1  . potassium chloride SA (K-DUR,KLOR-CON) 20 MEQ tablet TAKE 1 TABLET BY MOUTH DAILY 90 tablet 1  . traMADol (ULTRAM) 50 MG tablet TAKE ONE TABLET EVERY EIGHT HOURS AS NEEDED 90 tablet 5  . hydrOXYzine (ATARAX/VISTARIL) 25 MG tablet Take 25 mg by  mouth every 8 (eight) hours as needed.     Marland Kitchen amLODipine (NORVASC) 10 MG tablet TAKE 1 TABLET BY MOUTH DAILY (Patient not taking: Reported on 01/28/2017) 90 tablet 0  . doxycycline (VIBRA-TABS) 100 MG tablet Take 1 tablet (100 mg total) by mouth 2 (two) times daily. (Patient not taking: Reported on 01/28/2017) 10 tablet 0   No facility-administered medications prior to visit.     Review of Systems;  Patient denies headache, fevers, malaise, unintentional weight loss, skin rash, eye pain, sinus congestion and sinus pain, sore throat, dysphagia,  hemoptysis , cough, dyspnea, wheezing, chest pain, palpitations, orthopnea, edema, abdominal pain, nausea, melena, diarrhea, constipation, flank pain, dysuria, hematuria, urinary  Frequency, nocturia, numbness, tingling, seizures,  Focal weakness, Loss of consciousness,  Tremor, insomnia, depression, anxiety, and suicidal ideation.      Objective:  BP (!) 142/96 (BP Location: Left Arm, Patient Position: Sitting, Cuff Size: Large)   Pulse 100   Temp 98.4 F (36.9 C) (Oral)   Resp 16   Ht 5' 6.5" (1.689 m)   Wt 238 lb 6.4 oz (108.1 kg)   SpO2 95%   BMI 37.90 kg/m   BP Readings from Last 3 Encounters:  01/28/17 (!) 142/96  11/10/16 (!) 170/95  11/03/16 (!) 178/95    Wt Readings from Last 3 Encounters:  01/28/17 238 lb 6.4 oz (108.1 kg)  11/10/16 238 lb (108 kg)  11/03/16 245 lb (111.1 kg)    General appearance: alert, cooperative and appears stated age Ears: normal TM's and external ear canals both ears Throat: lips, mucosa, and tongue normal; teeth and gums normal Neck: no adenopathy, no carotid bruit, supple, symmetrical, trachea midline and thyroid not enlarged, symmetric, no tenderness/mass/nodules Back: symmetric, no curvature. ROM normal. No CVA tenderness. Lungs: clear to auscultation bilaterally Heart: regular rate and rhythm, S1, S2 normal, no murmur, click, rub or gallop Abdomen: soft, non-tender; bowel sounds normal; no masses,   no organomegaly Pulses: 2+ and symmetric Skin: Skin color, texture, turgor normal. No rashes or lesions Lymph nodes: Cervical, supraclavicular, and axillary nodes normal.  Lab Results  Component Value Date   HGBA1C 7.1 (H) 01/27/2017   HGBA1C 6.7 10/26/2016   HGBA1C 7.6 (H) 07/23/2016    Lab Results  Component Value Date   CREATININE 1.43 01/27/2017   CREATININE 1.48 07/23/2016   CREATININE 1.27 (H) 02/09/2016    Lab Results  Component Value Date   WBC 5.5 01/27/2017   HGB 14.2 01/27/2017   HCT 43.7 01/27/2017   PLT 175.0 01/27/2017   GLUCOSE 132 (H) 01/27/2017   CHOL 147 01/27/2017   TRIG 128.0 01/27/2017   HDL 37.90 (L) 01/27/2017   LDLDIRECT 94.0 01/27/2017  LDLCALC 83 01/27/2017   ALT 21 01/27/2017   AST 16 01/27/2017   NA 137 01/27/2017   K 4.2 01/27/2017   CL 104 01/27/2017   CREATININE 1.43 01/27/2017   BUN 27 (H) 01/27/2017   CO2 27 01/27/2017   TSH 2.95 07/23/2016   PSA 1.60 09/24/2015   HGBA1C 7.1 (H) 01/27/2017   MICROALBUR 46.8 (H) 07/23/2016    US Renal  Result Date: 03/17/2016 CLINICAL DATA:  Question of renal mass EXAM: RENAL / URINARY TRACT ULTRASOUND COMPLETE COMPARISON:  None. FINDINGS: Right Kidney: Length: 9.6 cm. No hydronephrosis is seen. The echogenicity of the renal parenchyma is within normal limits. A hypoechoic structures noted laterally in the mid right kidney of 1.2 cm in diameter most consistent with cyst. Left Kidney: Length: 10.5 cm. No hydronephrosis is seen. A single non obstructing 6 mm calculus is noted. There is a hypoechoic structure in the lower pole of the left kidney laterally of 1.7 cm most consistent with cyst. No solid renal lesion is seen. Bladder: The urinary bladder is not well distended but no abnormality is noted. IMPRESSION: 1. No solid renal mass is seen. There appear to be single small cysts bilaterally. 2. No hydronephrosis. 3. Nonobstructing 6 mm left renal calculus. Electronically Signed   By: Ivar Drape M.D.    On: 03/17/2016 15:11    Assessment & Plan:   Problem List Items Addressed This Visit    Essential hypertension    .he reports compliance with medication regimen  but has an elevated reading today in office.  He has been asked to check his BP at work and  submit readings for evaluation. Renal function will be checked today      Diabetes mellitus with nephropathy ([place])    improved  control since stopping glipzide and starting Jardiance 10 mg daily .  Diet reviewed and reduction in starches advised.  . Patient is up-to-date on eye exams and foot exam is normal today.. Patient is tolerating statin therapy for CAD risk reduction and on ACE/ARB for reduction in proteinuria.    Lab Results  Component Value Date   HGBA1C 7.1 (H) 01/27/2017    Lab Results  Component Value Date   MICROALBUR 46.8 (H) 07/23/2016           Arthritis of knee, degenerative    Patient  is planning for a TKR in July  andis considered to be at low to moderate risk  For perioperative complications  Based on today's exam and history. of type 2 dm, hypertension and   Mild CKD .   Baseline lytes,  hgb   Lab Results  Component Value Date   NA 137 01/27/2017   K 4.2 01/27/2017   CL 104 01/27/2017   CO2 27 01/27/2017   Lab Results  Component Value Date   CREATININE 1.43 01/27/2017   Lab Results  Component Value Date   WBC 5.5 01/27/2017   HGB 14.2 01/27/2017   HCT 43.7 01/27/2017   MCV 88.8 01/27/2017   PLT 175.0 01/27/2017         Relevant Medications   HYDROcodone-acetaminophen (NORCO/VICODIN) 5-325 MG tablet     A total of 25 minutes of face to face time was spent with patient more than half of which was spent in counselling about the above mentioned conditions  and coordination of care  I have discontinued Mr. Gruenberg's doxycycline. I have also changed his hydrOXYzine and HYDROcodone-acetaminophen. Additionally, I am having him maintain his aspirin, MULTIVITAL,  clobetasol cream, latanoprost,  Vitamin D-3, EPINEPHrine, INTRON A, potassium chloride SA, ONE TOUCH ULTRA TEST, montelukast, gabapentin, clorazepate, famotidine, chlorpheniramine-HYDROcodone, albuterol, carvedilol, traMADol, losartan, hydrALAZINE, JARDIANCE, amLODipine, furosemide, hydrALAZINE, and metFORMIN.  Meds ordered this encounter  Medications  . hydrOXYzine (ATARAX/VISTARIL) 25 MG tablet    Sig: Take 1 tablet (25 mg total) by mouth every 8 (eight) hours as needed.    Dispense:  90 tablet    Refill:  3  . HYDROcodone-acetaminophen (NORCO/VICODIN) 5-325 MG tablet    Sig: Take 1 tablet by mouth 2 (two) times daily as needed for moderate pain.    Dispense:  60 tablet    Refill:  0    Medications Discontinued During This Encounter  Medication Reason  . amLODipine (NORVASC) 10 MG tablet Duplicate  . doxycycline (VIBRA-TABS) 100 MG tablet Therapy completed  . hydrOXYzine (ATARAX/VISTARIL) 25 MG tablet Reorder    Follow-up: Return in about 3 months (around 04/30/2017) for follow up diabetes.   Crecencio Mc, MD

## 2017-01-28 NOTE — Patient Instructions (Addendum)
Your diabetes remains under excellent control  And your cholesterol and other labs are also normal.  If your home blood pressure begins to stay above 130/80,  increase your carvedilol to 6.25 mg twice  Daily   Do not check your blood pressure more than once daily    I have refilled the Vicodin for use not more than twice daily if needed for pain, until your surgery

## 2017-01-30 NOTE — Assessment & Plan Note (Addendum)
Patient  is planning for a TKR in July  andis considered to be at low to moderate risk  For perioperative complications  Based on today's exam and history. of type 2 dm, hypertension and   Mild CKD .   Baseline lytes,  hgb   Lab Results  Component Value Date   NA 137 01/27/2017   K 4.2 01/27/2017   CL 104 01/27/2017   CO2 27 01/27/2017   Lab Results  Component Value Date   CREATININE 1.43 01/27/2017   Lab Results  Component Value Date   WBC 5.5 01/27/2017   HGB 14.2 01/27/2017   HCT 43.7 01/27/2017   MCV 88.8 01/27/2017   PLT 175.0 01/27/2017

## 2017-01-30 NOTE — Assessment & Plan Note (Addendum)
improved  control since stopping glipzide and starting Jardiance 10 mg daily .  Diet reviewed and reduction in starches advised.  . Patient is up-to-date on eye exams and foot exam is normal today.. Patient is tolerating statin therapy for CAD risk reduction and on ACE/ARB for reduction in proteinuria.    Lab Results  Component Value Date   HGBA1C 7.1 (H) 01/27/2017    Lab Results  Component Value Date   MICROALBUR 46.8 (H) 07/23/2016

## 2017-01-30 NOTE — Assessment & Plan Note (Signed)
.  he reports compliance with medication regimen  but has an elevated reading today in office.  He has been asked to check his BP at work and  submit readings for evaluation. Renal function will be checked today ?

## 2017-02-02 DIAGNOSIS — C8400 Mycosis fungoides, unspecified site: Secondary | ICD-10-CM | POA: Diagnosis not present

## 2017-02-04 DIAGNOSIS — M1711 Unilateral primary osteoarthritis, right knee: Secondary | ICD-10-CM | POA: Diagnosis not present

## 2017-02-07 DIAGNOSIS — C84A0 Cutaneous T-cell lymphoma, unspecified, unspecified site: Secondary | ICD-10-CM | POA: Diagnosis not present

## 2017-02-07 DIAGNOSIS — Z79899 Other long term (current) drug therapy: Secondary | ICD-10-CM | POA: Diagnosis not present

## 2017-02-07 DIAGNOSIS — C8400 Mycosis fungoides, unspecified site: Secondary | ICD-10-CM | POA: Diagnosis not present

## 2017-02-10 DIAGNOSIS — Z538 Procedure and treatment not carried out for other reasons: Secondary | ICD-10-CM | POA: Diagnosis not present

## 2017-02-10 DIAGNOSIS — N189 Chronic kidney disease, unspecified: Secondary | ICD-10-CM | POA: Diagnosis not present

## 2017-02-10 DIAGNOSIS — R000 Tachycardia, unspecified: Secondary | ICD-10-CM | POA: Diagnosis not present

## 2017-02-10 DIAGNOSIS — Z79899 Other long term (current) drug therapy: Secondary | ICD-10-CM | POA: Diagnosis not present

## 2017-02-10 DIAGNOSIS — Z7984 Long term (current) use of oral hypoglycemic drugs: Secondary | ICD-10-CM | POA: Diagnosis not present

## 2017-02-10 DIAGNOSIS — I4892 Unspecified atrial flutter: Secondary | ICD-10-CM | POA: Diagnosis not present

## 2017-02-10 DIAGNOSIS — E1122 Type 2 diabetes mellitus with diabetic chronic kidney disease: Secondary | ICD-10-CM | POA: Diagnosis not present

## 2017-02-10 DIAGNOSIS — M1711 Unilateral primary osteoarthritis, right knee: Secondary | ICD-10-CM | POA: Diagnosis not present

## 2017-02-10 DIAGNOSIS — I129 Hypertensive chronic kidney disease with stage 1 through stage 4 chronic kidney disease, or unspecified chronic kidney disease: Secondary | ICD-10-CM | POA: Diagnosis not present

## 2017-02-15 DIAGNOSIS — C8400 Mycosis fungoides, unspecified site: Secondary | ICD-10-CM | POA: Diagnosis not present

## 2017-02-17 ENCOUNTER — Other Ambulatory Visit: Payer: Self-pay | Admitting: Internal Medicine

## 2017-02-17 DIAGNOSIS — C8400 Mycosis fungoides, unspecified site: Secondary | ICD-10-CM | POA: Diagnosis not present

## 2017-02-22 DIAGNOSIS — C8400 Mycosis fungoides, unspecified site: Secondary | ICD-10-CM | POA: Diagnosis not present

## 2017-02-23 ENCOUNTER — Telehealth: Payer: Self-pay | Admitting: Internal Medicine

## 2017-02-23 NOTE — Telephone Encounter (Signed)
Please advise 

## 2017-02-23 NOTE — Telephone Encounter (Signed)
Pt spouse [name] called and stated that pt would like an order for a sleep study done. Please advise, thank you!  Call [name] @ [phone]

## 2017-02-24 DIAGNOSIS — C8400 Mycosis fungoides, unspecified site: Secondary | ICD-10-CM | POA: Diagnosis not present

## 2017-02-24 NOTE — Telephone Encounter (Signed)
Please clarify,  It appears that his doctro at [facility] ordered one on July 10th

## 2017-02-25 NOTE — Telephone Encounter (Signed)
Spoke with doris and informed her that Dr. Redmond Pulling has actually ordered the pt a sleep study as of 02/15/2017. The wife stated that they have not heard anything about it, so I advised that they give Dr. Dois Davenport office a call and see what is going on with it.

## 2017-02-26 ENCOUNTER — Other Ambulatory Visit: Payer: Self-pay | Admitting: Internal Medicine

## 2017-02-26 DIAGNOSIS — I10 Essential (primary) hypertension: Secondary | ICD-10-CM

## 2017-03-01 DIAGNOSIS — C8400 Mycosis fungoides, unspecified site: Secondary | ICD-10-CM | POA: Diagnosis not present

## 2017-03-03 DIAGNOSIS — C8400 Mycosis fungoides, unspecified site: Secondary | ICD-10-CM | POA: Diagnosis not present

## 2017-03-07 ENCOUNTER — Other Ambulatory Visit: Payer: Self-pay | Admitting: Internal Medicine

## 2017-03-08 DIAGNOSIS — C8400 Mycosis fungoides, unspecified site: Secondary | ICD-10-CM | POA: Diagnosis not present

## 2017-03-09 ENCOUNTER — Other Ambulatory Visit: Payer: Self-pay | Admitting: Internal Medicine

## 2017-03-10 DIAGNOSIS — C8400 Mycosis fungoides, unspecified site: Secondary | ICD-10-CM | POA: Diagnosis not present

## 2017-03-14 ENCOUNTER — Other Ambulatory Visit: Payer: Self-pay | Admitting: Internal Medicine

## 2017-03-15 DIAGNOSIS — C8400 Mycosis fungoides, unspecified site: Secondary | ICD-10-CM | POA: Diagnosis not present

## 2017-03-16 ENCOUNTER — Other Ambulatory Visit: Payer: Self-pay | Admitting: Internal Medicine

## 2017-03-17 DIAGNOSIS — Z79899 Other long term (current) drug therapy: Secondary | ICD-10-CM | POA: Diagnosis not present

## 2017-03-17 DIAGNOSIS — C84A0 Cutaneous T-cell lymphoma, unspecified, unspecified site: Secondary | ICD-10-CM | POA: Diagnosis not present

## 2017-03-17 DIAGNOSIS — C8400 Mycosis fungoides, unspecified site: Secondary | ICD-10-CM | POA: Diagnosis not present

## 2017-03-18 ENCOUNTER — Other Ambulatory Visit: Payer: Self-pay | Admitting: Internal Medicine

## 2017-03-21 ENCOUNTER — Telehealth: Payer: Self-pay | Admitting: *Deleted

## 2017-03-21 DIAGNOSIS — M1711 Unilateral primary osteoarthritis, right knee: Secondary | ICD-10-CM | POA: Diagnosis not present

## 2017-03-21 DIAGNOSIS — I4892 Unspecified atrial flutter: Secondary | ICD-10-CM | POA: Diagnosis not present

## 2017-03-21 DIAGNOSIS — R600 Localized edema: Secondary | ICD-10-CM | POA: Diagnosis not present

## 2017-03-21 DIAGNOSIS — I10 Essential (primary) hypertension: Secondary | ICD-10-CM | POA: Diagnosis not present

## 2017-03-21 DIAGNOSIS — R9431 Abnormal electrocardiogram [ECG] [EKG]: Secondary | ICD-10-CM | POA: Diagnosis not present

## 2017-03-21 DIAGNOSIS — R000 Tachycardia, unspecified: Secondary | ICD-10-CM | POA: Diagnosis not present

## 2017-03-21 DIAGNOSIS — Z7901 Long term (current) use of anticoagulants: Secondary | ICD-10-CM | POA: Diagnosis not present

## 2017-03-21 NOTE — Telephone Encounter (Signed)
Daniel Kline from [facility] has requested any office notes, imagining and reports in associated to patients A-flutter.  Contact [phone] Fax [phone]

## 2017-03-22 ENCOUNTER — Other Ambulatory Visit: Payer: Self-pay | Admitting: Internal Medicine

## 2017-03-22 DIAGNOSIS — C8400 Mycosis fungoides, unspecified site: Secondary | ICD-10-CM | POA: Diagnosis not present

## 2017-03-23 NOTE — Telephone Encounter (Signed)
He has no history of atrial fib

## 2017-03-23 NOTE — Telephone Encounter (Signed)
Looked through the chart and could not find any notes about a-fib. Spoke with Daniel Kline and informed her that I could not find any and she stated that the pt went in for knee surgery and they canceled it because of his heart rate. She then stated that the pt gave her Dr. Lupita Dawn name and stated that the a-fib had just been recently diagnosed at our office. While talking to Daniel Kline on the phone I looked through the chart again and still could not find anything. Daniel Kline stated that if we couldn't find anything then that would be ok. Do you know anything about this pt having a-fib? Please advise.

## 2017-03-23 NOTE — Telephone Encounter (Signed)
Spoke with Daniel Kline to let her know that the pt does not have any history of atrial fib. She stated that maybe the pt was confused and she would give him a call to find out.

## 2017-03-24 DIAGNOSIS — C8400 Mycosis fungoides, unspecified site: Secondary | ICD-10-CM | POA: Diagnosis not present

## 2017-03-29 DIAGNOSIS — C8400 Mycosis fungoides, unspecified site: Secondary | ICD-10-CM | POA: Diagnosis not present

## 2017-03-30 DIAGNOSIS — R809 Proteinuria, unspecified: Secondary | ICD-10-CM | POA: Diagnosis not present

## 2017-03-30 DIAGNOSIS — N183 Chronic kidney disease, stage 3 (moderate): Secondary | ICD-10-CM | POA: Diagnosis not present

## 2017-03-30 DIAGNOSIS — E1122 Type 2 diabetes mellitus with diabetic chronic kidney disease: Secondary | ICD-10-CM | POA: Diagnosis not present

## 2017-03-30 DIAGNOSIS — I129 Hypertensive chronic kidney disease with stage 1 through stage 4 chronic kidney disease, or unspecified chronic kidney disease: Secondary | ICD-10-CM | POA: Diagnosis not present

## 2017-03-30 DIAGNOSIS — N2581 Secondary hyperparathyroidism of renal origin: Secondary | ICD-10-CM | POA: Diagnosis not present

## 2017-03-31 DIAGNOSIS — C8400 Mycosis fungoides, unspecified site: Secondary | ICD-10-CM | POA: Diagnosis not present

## 2017-04-04 DIAGNOSIS — H40052 Ocular hypertension, left eye: Secondary | ICD-10-CM | POA: Diagnosis not present

## 2017-04-04 DIAGNOSIS — H401111 Primary open-angle glaucoma, right eye, mild stage: Secondary | ICD-10-CM | POA: Diagnosis not present

## 2017-04-04 DIAGNOSIS — H25813 Combined forms of age-related cataract, bilateral: Secondary | ICD-10-CM | POA: Diagnosis not present

## 2017-04-05 DIAGNOSIS — C8400 Mycosis fungoides, unspecified site: Secondary | ICD-10-CM | POA: Diagnosis not present

## 2017-04-07 DIAGNOSIS — C8400 Mycosis fungoides, unspecified site: Secondary | ICD-10-CM | POA: Diagnosis not present

## 2017-04-09 ENCOUNTER — Other Ambulatory Visit: Payer: Self-pay | Admitting: Internal Medicine

## 2017-04-12 DIAGNOSIS — C8400 Mycosis fungoides, unspecified site: Secondary | ICD-10-CM | POA: Diagnosis not present

## 2017-04-14 DIAGNOSIS — C8400 Mycosis fungoides, unspecified site: Secondary | ICD-10-CM | POA: Diagnosis not present

## 2017-04-18 ENCOUNTER — Other Ambulatory Visit: Payer: Self-pay | Admitting: Internal Medicine

## 2017-04-18 DIAGNOSIS — C84A0 Cutaneous T-cell lymphoma, unspecified, unspecified site: Secondary | ICD-10-CM | POA: Diagnosis not present

## 2017-04-18 DIAGNOSIS — Z79899 Other long term (current) drug therapy: Secondary | ICD-10-CM | POA: Diagnosis not present

## 2017-04-19 DIAGNOSIS — C8400 Mycosis fungoides, unspecified site: Secondary | ICD-10-CM | POA: Diagnosis not present

## 2017-04-19 NOTE — Telephone Encounter (Signed)
Rx printed, signed and faxed.

## 2017-04-19 NOTE — Telephone Encounter (Signed)
Needs 3 month follow up.  Refill for 30 days

## 2017-04-19 NOTE — Telephone Encounter (Signed)
Last refill was 10/15/16, #90 with 3 refills.  Please advise thanks

## 2017-04-20 DIAGNOSIS — I4892 Unspecified atrial flutter: Secondary | ICD-10-CM | POA: Diagnosis not present

## 2017-04-21 DIAGNOSIS — C8400 Mycosis fungoides, unspecified site: Secondary | ICD-10-CM | POA: Diagnosis not present

## 2017-04-26 DIAGNOSIS — M722 Plantar fascial fibromatosis: Secondary | ICD-10-CM | POA: Diagnosis not present

## 2017-04-26 DIAGNOSIS — L309 Dermatitis, unspecified: Secondary | ICD-10-CM | POA: Diagnosis not present

## 2017-04-26 DIAGNOSIS — E1142 Type 2 diabetes mellitus with diabetic polyneuropathy: Secondary | ICD-10-CM | POA: Diagnosis not present

## 2017-04-26 DIAGNOSIS — C8400 Mycosis fungoides, unspecified site: Secondary | ICD-10-CM | POA: Diagnosis not present

## 2017-04-27 DIAGNOSIS — M1711 Unilateral primary osteoarthritis, right knee: Secondary | ICD-10-CM | POA: Diagnosis not present

## 2017-04-27 DIAGNOSIS — C8590 Non-Hodgkin lymphoma, unspecified, unspecified site: Secondary | ICD-10-CM | POA: Diagnosis not present

## 2017-04-27 DIAGNOSIS — N183 Chronic kidney disease, stage 3 (moderate): Secondary | ICD-10-CM | POA: Diagnosis not present

## 2017-04-27 DIAGNOSIS — I483 Typical atrial flutter: Secondary | ICD-10-CM | POA: Diagnosis not present

## 2017-04-27 DIAGNOSIS — I4892 Unspecified atrial flutter: Secondary | ICD-10-CM | POA: Diagnosis not present

## 2017-04-27 DIAGNOSIS — I129 Hypertensive chronic kidney disease with stage 1 through stage 4 chronic kidney disease, or unspecified chronic kidney disease: Secondary | ICD-10-CM | POA: Diagnosis not present

## 2017-04-27 DIAGNOSIS — E1121 Type 2 diabetes mellitus with diabetic nephropathy: Secondary | ICD-10-CM | POA: Diagnosis not present

## 2017-04-27 DIAGNOSIS — K219 Gastro-esophageal reflux disease without esophagitis: Secondary | ICD-10-CM | POA: Diagnosis not present

## 2017-04-27 DIAGNOSIS — E1122 Type 2 diabetes mellitus with diabetic chronic kidney disease: Secondary | ICD-10-CM | POA: Diagnosis not present

## 2017-04-27 DIAGNOSIS — Z7984 Long term (current) use of oral hypoglycemic drugs: Secondary | ICD-10-CM | POA: Diagnosis not present

## 2017-04-27 DIAGNOSIS — Z7901 Long term (current) use of anticoagulants: Secondary | ICD-10-CM | POA: Diagnosis not present

## 2017-04-28 ENCOUNTER — Telehealth: Payer: Self-pay | Admitting: Internal Medicine

## 2017-04-28 DIAGNOSIS — I129 Hypertensive chronic kidney disease with stage 1 through stage 4 chronic kidney disease, or unspecified chronic kidney disease: Secondary | ICD-10-CM | POA: Diagnosis not present

## 2017-04-28 DIAGNOSIS — I483 Typical atrial flutter: Secondary | ICD-10-CM | POA: Diagnosis not present

## 2017-04-28 DIAGNOSIS — E1122 Type 2 diabetes mellitus with diabetic chronic kidney disease: Secondary | ICD-10-CM | POA: Diagnosis not present

## 2017-04-28 DIAGNOSIS — I491 Atrial premature depolarization: Secondary | ICD-10-CM | POA: Diagnosis not present

## 2017-04-28 NOTE — Telephone Encounter (Signed)
Pt is being released from the hospital today. [facility] said he needed a 2 week follow up. Pt has an appt. On 05/04/17 at 3:00pm for a follow. Should we go ahead  And change the visit to hospital follow up?

## 2017-04-29 NOTE — Telephone Encounter (Signed)
Patient returned your call.

## 2017-04-29 NOTE — Telephone Encounter (Signed)
Transition Care Management Follow-up Telephone Call  How have you been since you were released from the hospital? Feeling pretty good since being home .   Do you understand why you were in the hospital? Cardiac ablation for A-Fib ( Atrial Flutter)   Do you understand the discharge instrcutions? Yes  Items Reviewed:  Medications reviewed: Yes, Eliquis for 4 weeks.  Allergies reviewed: Yes  Dietary changes reviewed: Yes Referrals reviewed:  Yes  Functional Questionnaire:   Activities of Daily Living (ADLs):   He states they are independent in the following: Patient is independent in [place]. States they require assistance with the following:  No assist needed.   Any transportation issues/concerns?: No   Any patient concerns? Concerned if you would want to change dosage on lasix, wearing Ted hose no swelling noted at this time, advised patient to not change until sees PCP. Patient is weighing self daly and was advised to call if weight gain of more than 2 pounds, made sure patient new proper way to take weight after emptying bladder and before eating.   Confirmed importance and date/time of follow-up visits scheduled: Yes   Confirmed with patient if condition begins to worsen call PCP or go to the ER.  Patient was given the Call-a-Nurse line [phone]: Yes

## 2017-04-29 NOTE — Telephone Encounter (Signed)
First attempt at TCM made today left message for patient to return call to office will follow up.

## 2017-05-02 ENCOUNTER — Other Ambulatory Visit: Payer: Self-pay | Admitting: Internal Medicine

## 2017-05-03 DIAGNOSIS — C8400 Mycosis fungoides, unspecified site: Secondary | ICD-10-CM | POA: Diagnosis not present

## 2017-05-04 ENCOUNTER — Encounter: Payer: Self-pay | Admitting: Internal Medicine

## 2017-05-04 ENCOUNTER — Ambulatory Visit (INDEPENDENT_AMBULATORY_CARE_PROVIDER_SITE_OTHER): Payer: PPO | Admitting: Internal Medicine

## 2017-05-04 VITALS — BP 140/82 | HR 65 | Temp 97.8°F | Resp 15 | Ht 66.5 in | Wt 241.0 lb

## 2017-05-04 DIAGNOSIS — E1121 Type 2 diabetes mellitus with diabetic nephropathy: Secondary | ICD-10-CM

## 2017-05-04 DIAGNOSIS — T63444S Toxic effect of venom of bees, undetermined, sequela: Secondary | ICD-10-CM

## 2017-05-04 DIAGNOSIS — N182 Chronic kidney disease, stage 2 (mild): Secondary | ICD-10-CM

## 2017-05-04 DIAGNOSIS — I483 Typical atrial flutter: Secondary | ICD-10-CM

## 2017-05-04 DIAGNOSIS — Z09 Encounter for follow-up examination after completed treatment for conditions other than malignant neoplasm: Secondary | ICD-10-CM

## 2017-05-04 DIAGNOSIS — M1711 Unilateral primary osteoarthritis, right knee: Secondary | ICD-10-CM

## 2017-05-04 DIAGNOSIS — E7800 Pure hypercholesterolemia, unspecified: Secondary | ICD-10-CM | POA: Diagnosis not present

## 2017-05-04 MED ORDER — SITAGLIPTIN PHOSPHATE 50 MG PO TABS: 50.0000 mg | ORAL_TABLET | Freq: Every day | ORAL | 2 refills | Status: DC

## 2017-05-04 MED ORDER — HYDROCODONE-ACETAMINOPHEN 5-325 MG PO TABS: 1.0000 | ORAL_TABLET | Freq: Two times a day (BID) | ORAL | 0 refills | Status: DC | PRN

## 2017-05-04 NOTE — Progress Notes (Signed)
Subjective:  Patient ID: Daniel Kline, male    DOB: [birth year]  Age: 71 y.o. MRN: [id number]  CC: The primary encounter diagnosis was Diabetes mellitus with nephropathy ([place]). Diagnoses of Hypercholesterolemia, Primary osteoarthritis of right knee, Anaphylactic reaction to bee sting, undetermined intent, sequela, Typical atrial flutter ([place]), and CKD (chronic kidney disease), stage II were also pertinent to this visit.  HPI Daniel Kline presents for hospital  follow up.  Patient was admitted to [facility] on sept 190 when he present for elective knee replacement and  was found to be in asymptomatic atrial flutter .  He underwent radiofrequency ablation via right femoral access and remained in sinus rhythm overnight.  He was Discharged home on Sept  20 with instructions to take  Eliqiuis for one month,  Knee surgery to be postponed until use of Eliquis stopped.   h Available labs, imaging studies and procedures reviewed with patient.  Has a follow up appt with cardiology   Since discharge he states that he Feeling ok,  But Everything hurts.    Has been Using melatonin for the past week to help him sleep.  Has not slept well for months,  Have  "crazy dreams." Feels like it is stressing him out more .has not had his sleep study yet, kept postponing it,  Now scheduled for oct 5th  After the hospitalists advised him to get it done.   Taking eliquis as directed.  Released to return to previous exercise routine.  Has had ECHO on Sept 12.  Normal EF, normal valves,  Pulmonary hypertension , RV pressure 41   Wants my ok to start scuba diving  Type 2 DM:  Can't afford to continue Jardiance too $$$$   Lab Results  Component Value Date   CHOL 147 01/27/2017   HDL 37.90 (L) 01/27/2017   LDLCALC 83 01/27/2017   LDLDIRECT 94.0 01/27/2017   TRIG 128.0 01/27/2017   CHOLHDL 4 01/27/2017     Lab Results  Component Value Date   HGBA1C 7.3 (H) 05/04/2017         Outpatient Medications Prior to  Visit  Medication Sig Dispense Refill  . albuterol (PROVENTIL HFA) 108 (90 Base) MCG/ACT inhaler Inhale 2 puffs into the lungs every 6 (six) hours as needed for wheezing or shortness of breath. 1 Inhaler 1  . amLODipine (NORVASC) 10 MG tablet TAKE 1 TABLET BY MOUTH DAILY 90 tablet 1  . apixaban (ELIQUIS) 5 MG TABS tablet Take 5 mg by mouth.    Marland Kitchen aspirin (ASPIR-81) 81 MG EC tablet Take 81 mg by mouth daily. Reported on 10/28/2015    . carvedilol (COREG) 3.125 MG tablet TAKE ONE TABLET BY MOUTH TWICE DAILY WITH A MEAL 60 tablet 3  . chlorpheniramine-HYDROcodone (TUSSIONEX PENNKINETIC ER) 10-8 MG/5ML SUER Take 5 mLs by mouth at bedtime as needed for cough. 70 mL 0  . Cholecalciferol (VITAMIN D-3) 1000 units CAPS Take 2 capsules by mouth daily. Reported on 02/27/2016    . clobetasol cream (TEMOVATE) 7.82 % Apply 1 application topically 2 (two) times daily.     . clorazepate (TRANXENE) 7.5 MG tablet TAKE 1 TABLET BY MOUTH 3 TIMES DAILY AS NEEDED 90 tablet 0  . EPINEPHrine (EPIPEN 2-PAK) 0.3 mg/0.3 mL IJ SOAJ injection Inject 0.3 mLs (0.3 mg total) into the muscle once. 1 Device 1  . famotidine (PEPCID) 20 MG tablet TAKE 1 TABLET BY MOUTH TWICE DAILY 60 tablet 5  . furosemide (LASIX) 20 MG tablet TAKE  ONE TABLET BY MOUTH EVERY MORNING 30 tablet 3  . gabapentin (NEURONTIN) 300 MG capsule TAKE 1 CAPSULE BY MOUTH 3 TIMES DAILY 90 capsule 2  . hydrALAZINE (APRESOLINE) 25 MG tablet Take 1 tablet (25 mg total) by mouth 3 (three) times daily. 90 tablet 1  . hydrALAZINE (APRESOLINE) 25 MG tablet TAKE ONE TABLET BY MOUTH 3 TIMES DAILY 90 tablet 1  . hydrALAZINE (APRESOLINE) 50 MG tablet TAKE ONE TABLET 3 TIMES DAILY 90 tablet 1  . hydrOXYzine (ATARAX/VISTARIL) 25 MG tablet Take 1 tablet (25 mg total) by mouth every 8 (eight) hours as needed. 90 tablet 3  . INTRON A 6000000 UNIT/ML injection     . JARDIANCE 10 MG TABS tablet TAKE ONE TABLET EVERY DAY 90 tablet 2  . latanoprost (XALATAN) 0.005 % ophthalmic  solution Place 1 drop into the right eye at bedtime.    Marland Kitchen losartan (COZAAR) 100 MG tablet TAKE ONE TABLET EVERY DAY 90 tablet 1  . metFORMIN (GLUCOPHAGE) 500 MG tablet TAKE ONE TABLET TWICE DAILY 60 tablet 3  . montelukast (SINGULAIR) 10 MG tablet TAKE ONE TABLET BY MOUTH EVERY EVENING AT BEDTIME 30 tablet 3  . Multiple Vitamins-Minerals (MULTIVITAL) tablet Take 1 tablet by mouth daily.      . ONE TOUCH ULTRA TEST test strip TEST once daily or as directed 100 each 1  . potassium chloride SA (K-DUR,KLOR-CON) 20 MEQ tablet TAKE ONE TABLET EVERY DAY 90 tablet 1  . traMADol (ULTRAM) 50 MG tablet TAKE ONE TABLET EVERY EIGHT HOURS AS NEEDED 90 tablet 5  . HYDROcodone-acetaminophen (NORCO/VICODIN) 5-325 MG tablet Take 1 tablet by mouth 2 (two) times daily as needed for moderate pain. 60 tablet 0   No facility-administered medications prior to visit.     Review of Systems;  Patient denies headache, fevers, malaise, unintentional weight loss, skin rash, eye pain, sinus congestion and sinus pain, sore throat, dysphagia,  hemoptysis , cough, dyspnea, wheezing, chest pain, palpitations, orthopnea, edema, abdominal pain, nausea, melena, diarrhea, constipation, flank pain, dysuria, hematuria, urinary  Frequency, nocturia, numbness, tingling, seizures,  Focal weakness, Loss of consciousness,  Tremor, insomnia, depression, anxiety, and suicidal ideation.      Objective:  BP 140/82 (BP Location: Left Arm, Patient Position: Sitting, Cuff Size: Large)   Pulse 65   Temp 97.8 F (36.6 C) (Oral)   Resp 15   Ht 5' 6.5" (1.689 m)   Wt 241 lb (109.3 kg)   SpO2 95%   BMI 38.32 kg/m   BP Readings from Last 3 Encounters:  05/04/17 140/82  01/28/17 (!) 142/96  11/10/16 (!) 170/95    Wt Readings from Last 3 Encounters:  05/04/17 241 lb (109.3 kg)  01/28/17 238 lb 6.4 oz (108.1 kg)  11/10/16 238 lb (108 kg)    General appearance: alert, cooperative and appears stated age Ears: normal TM's and external  ear canals both ears Throat: lips, mucosa, and tongue normal; teeth and gums normal Neck: no adenopathy, no carotid bruit, supple, symmetrical, trachea midline and thyroid not enlarged, symmetric, no tenderness/mass/nodules Back: symmetric, no curvature. ROM normal. No CVA tenderness. Lungs: clear to auscultation bilaterally Heart: regular rate and rhythm, S1, S2 normal, no murmur, click, rub or gallop Abdomen: soft, non-tender; bowel sounds normal; no masses,  no organomegaly Pulses: 2+ and symmetric Skin: Skin color, texture, turgor normal. No rashes or lesions Lymph nodes: Cervical, supraclavicular, and axillary nodes normal.  Lab Results  Component Value Date   HGBA1C 7.3 (H) 05/04/2017  HGBA1C 7.1 (H) 01/27/2017   HGBA1C 6.7 10/26/2016    Lab Results  Component Value Date   CREATININE 1.38 05/04/2017   CREATININE 1.43 01/27/2017   CREATININE 1.48 07/23/2016    Lab Results  Component Value Date   WBC 5.5 01/27/2017   HGB 14.2 01/27/2017   HCT 43.7 01/27/2017   PLT 175.0 01/27/2017   GLUCOSE 102 (H) 05/04/2017   CHOL 147 01/27/2017   TRIG 128.0 01/27/2017   HDL 37.90 (L) 01/27/2017   LDLDIRECT 94.0 01/27/2017   LDLCALC 83 01/27/2017   ALT 15 05/04/2017   AST 16 05/04/2017   NA 139 05/04/2017   K 4.4 05/04/2017   CL 104 05/04/2017   CREATININE 1.38 05/04/2017   BUN 22 05/04/2017   CO2 29 05/04/2017   TSH 2.95 07/23/2016   PSA 1.60 09/24/2015   HGBA1C 7.3 (H) 05/04/2017   MICROALBUR 46.8 (H) 07/23/2016    US Renal  Result Date: 03/17/2016 CLINICAL DATA:  Question of renal mass EXAM: RENAL / URINARY TRACT ULTRASOUND COMPLETE COMPARISON:  None. FINDINGS: Right Kidney: Length: 9.6 cm. No hydronephrosis is seen. The echogenicity of the renal parenchyma is within normal limits. A hypoechoic structures noted laterally in the mid right kidney of 1.2 cm in diameter most consistent with cyst. Left Kidney: Length: 10.5 cm. No hydronephrosis is seen. A single non  obstructing 6 mm calculus is noted. There is a hypoechoic structure in the lower pole of the left kidney laterally of 1.7 cm most consistent with cyst. No solid renal lesion is seen. Bladder: The urinary bladder is not well distended but no abnormality is noted. IMPRESSION: 1. No solid renal mass is seen. There appear to be single small cysts bilaterally. 2. No hydronephrosis. 3. Nonobstructing 6 mm left renal calculus. Electronically Signed   By: Ivar Drape M.D.   On: 03/17/2016 15:11    Assessment & Plan:   Problem List Items Addressed This Visit    Arthritis of knee, degenerative    Knee surgery postponed until he has completed 6 weeks of anticoagulation post cardioablation for atrial flutter,  vicodin refilled       Relevant Medications   HYDROcodone-acetaminophen (NORCO/VICODIN) 5-325 MG tablet   Atrial flutter (HCC)    Suspect nocturnal hypoxia as the cause.  S.p radiofrequency ablation at [facility] on Sept 19      Bee sting-induced anaphylaxis   CKD (chronic kidney disease), stage II    Stable and Improved with suspension of NSAIDs.        Diabetes mellitus with nephropathy ([place]) - Primary    medication change to [place],  Due to cost of jardiance.continue losartan for proteinuria  Lab Results  Component Value Date   HGBA1C 7.3 (H) 05/04/2017   Lab Results  Component Value Date   MICROALBUR 46.8 (H) 07/23/2016         Relevant Orders   Hemoglobin A1c (Completed)   Hypercholesterolemia   Relevant Orders   Comprehensive metabolic panel (Completed)      I am having Daniel Kline maintain his aspirin, MULTIVITAL, clobetasol cream, latanoprost, Vitamin D-3, EPINEPHrine, INTRON A, ONE TOUCH ULTRA TEST, famotidine, chlorpheniramine-HYDROcodone, albuterol, traMADol, hydrALAZINE, amLODipine, hydrOXYzine, losartan, montelukast, metFORMIN, hydrALAZINE, potassium chloride SA, hydrALAZINE, JARDIANCE, furosemide, gabapentin, clorazepate, apixaban, carvedilol, and  HYDROcodone-acetaminophen.  Meds ordered this encounter  Medications  . HYDROcodone-acetaminophen (NORCO/VICODIN) 5-325 MG tablet    Sig: Take 1 tablet by mouth 2 (two) times daily as needed for moderate pain.    Dispense:  60  tablet    Refill:  0  . DISCONTD: sitaGLIPtin (JANUVIA) 50 MG tablet    Sig: Take 1 tablet (50 mg total) by mouth daily.    Dispense:  30 tablet    Refill:  2    Medications Discontinued During This Encounter  Medication Reason  . HYDROcodone-acetaminophen (NORCO/VICODIN) 5-325 MG tablet Reorder    Follow-up: No Follow-up on file.   Crecencio Mc, MD

## 2017-05-04 NOTE — Patient Instructions (Addendum)
Protect your kidneys by avoiding the following medications: meloxicam advil Aleve Diclofenac celebrex Naproxen   The sleep study is very important .  It may help [place] figure out why you had the episode of atrial fib   We will switch your  Jardiace to Januvia to save you $$$$$ .  It will also lower your A1c

## 2017-05-05 LAB — COMPREHENSIVE METABOLIC PANEL
ALBUMIN: 4 g/dL (ref 3.5–5.2)
ALT: 15 U/L (ref 0–53)
AST: 16 U/L (ref 0–37)
Alkaline Phosphatase: 57 U/L (ref 39–117)
BUN: 22 mg/dL (ref 6–23)
CALCIUM: 9.6 mg/dL (ref 8.4–10.5)
CHLORIDE: 104 meq/L (ref 96–112)
CO2: 29 mEq/L (ref 19–32)
CREATININE: 1.38 mg/dL (ref 0.40–1.50)
GFR: 53.73 mL/min — ABNORMAL LOW (ref >60.00)
Glucose, Bld: 102 mg/dL — ABNORMAL HIGH (ref 70–99)
POTASSIUM: 4.4 meq/L (ref 3.5–5.1)
Sodium: 139 mEq/L (ref 135–145)
Total Bilirubin: 0.4 mg/dL (ref 0.2–1.2)
Total Protein: 6.9 g/dL (ref 6.0–8.3)

## 2017-05-05 LAB — HEMOGLOBIN A1C: HEMOGLOBIN A1C: 7.3 % — AB (ref 4.6–6.5)

## 2017-05-06 ENCOUNTER — Telehealth: Payer: Self-pay | Admitting: *Deleted

## 2017-05-06 ENCOUNTER — Other Ambulatory Visit: Payer: Self-pay | Admitting: Internal Medicine

## 2017-05-06 MED ORDER — SITAGLIPTIN PHOSPHATE 100 MG PO TABS: 100.0000 mg | ORAL_TABLET | Freq: Every day | ORAL | 0 refills | Status: DC

## 2017-05-06 MED ORDER — SITAGLIPTIN PHOSPHATE 100 MG PO TABS: 100.0000 mg | ORAL_TABLET | Freq: Every day | ORAL | 1 refills | Status: DC

## 2017-05-06 NOTE — Telephone Encounter (Signed)
Pt requested a medication refill for sitagliptin [facility]

## 2017-05-06 NOTE — Telephone Encounter (Signed)
Refilled

## 2017-05-07 DIAGNOSIS — Z09 Encounter for follow-up examination after completed treatment for conditions other than malignant neoplasm: Secondary | ICD-10-CM | POA: Insufficient documentation

## 2017-05-07 NOTE — Assessment & Plan Note (Signed)
Knee surgery postponed until he has completed 6 weeks of anticoagulation post cardioablation for atrial flutter,  vicodin refilled

## 2017-05-07 NOTE — Assessment & Plan Note (Signed)
Stable and Improved with suspension of NSAIDs.

## 2017-05-07 NOTE — Assessment & Plan Note (Signed)
Suspect nocturnal hypoxia as the cause.  S.p radiofrequency ablation at [facility] on Sept 19

## 2017-05-07 NOTE — Assessment & Plan Note (Signed)
medication change to [place],  Due to cost of jardiance.continue losartan for proteinuria  Lab Results  Component Value Date   HGBA1C 7.3 (H) 05/04/2017   Lab Results  Component Value Date   MICROALBUR 46.8 (H) 07/23/2016

## 2017-05-07 NOTE — Assessment & Plan Note (Signed)
Patient is stable post discharge and has no new issues or questions about discharge plans at the visit today for hospital follow up. All labs , imaging studies and progress notes from admission were reviewed with patient today   

## 2017-05-10 DIAGNOSIS — L309 Dermatitis, unspecified: Secondary | ICD-10-CM | POA: Diagnosis not present

## 2017-05-10 DIAGNOSIS — C8400 Mycosis fungoides, unspecified site: Secondary | ICD-10-CM | POA: Diagnosis not present

## 2017-05-10 DIAGNOSIS — M722 Plantar fascial fibromatosis: Secondary | ICD-10-CM | POA: Diagnosis not present

## 2017-05-10 DIAGNOSIS — E1142 Type 2 diabetes mellitus with diabetic polyneuropathy: Secondary | ICD-10-CM | POA: Diagnosis not present

## 2017-05-12 ENCOUNTER — Telehealth: Payer: Self-pay

## 2017-05-12 DIAGNOSIS — C8400 Mycosis fungoides, unspecified site: Secondary | ICD-10-CM | POA: Diagnosis not present

## 2017-05-12 DIAGNOSIS — E7800 Pure hypercholesterolemia, unspecified: Secondary | ICD-10-CM

## 2017-05-12 DIAGNOSIS — E1121 Type 2 diabetes mellitus with diabetic nephropathy: Secondary | ICD-10-CM

## 2017-05-12 NOTE — Telephone Encounter (Signed)
I forgot to place lab orders when patient was called with lab results.

## 2017-05-13 DIAGNOSIS — I10 Essential (primary) hypertension: Secondary | ICD-10-CM | POA: Diagnosis not present

## 2017-05-13 DIAGNOSIS — G4733 Obstructive sleep apnea (adult) (pediatric): Secondary | ICD-10-CM | POA: Diagnosis not present

## 2017-05-13 DIAGNOSIS — Z79899 Other long term (current) drug therapy: Secondary | ICD-10-CM | POA: Diagnosis not present

## 2017-05-13 DIAGNOSIS — I483 Typical atrial flutter: Secondary | ICD-10-CM | POA: Diagnosis not present

## 2017-05-13 DIAGNOSIS — Z7689 Persons encountering health services in other specified circumstances: Secondary | ICD-10-CM | POA: Diagnosis not present

## 2017-05-13 DIAGNOSIS — I4891 Unspecified atrial fibrillation: Secondary | ICD-10-CM | POA: Diagnosis not present

## 2017-05-13 DIAGNOSIS — E119 Type 2 diabetes mellitus without complications: Secondary | ICD-10-CM | POA: Diagnosis not present

## 2017-05-13 DIAGNOSIS — Z8612 Personal history of poliomyelitis: Secondary | ICD-10-CM | POA: Diagnosis not present

## 2017-05-13 DIAGNOSIS — Z7984 Long term (current) use of oral hypoglycemic drugs: Secondary | ICD-10-CM | POA: Diagnosis not present

## 2017-05-16 DIAGNOSIS — C84A0 Cutaneous T-cell lymphoma, unspecified, unspecified site: Secondary | ICD-10-CM | POA: Diagnosis not present

## 2017-05-16 DIAGNOSIS — Z79899 Other long term (current) drug therapy: Secondary | ICD-10-CM | POA: Diagnosis not present

## 2017-05-17 DIAGNOSIS — C8400 Mycosis fungoides, unspecified site: Secondary | ICD-10-CM | POA: Diagnosis not present

## 2017-05-19 DIAGNOSIS — C8400 Mycosis fungoides, unspecified site: Secondary | ICD-10-CM | POA: Diagnosis not present

## 2017-05-24 DIAGNOSIS — C8400 Mycosis fungoides, unspecified site: Secondary | ICD-10-CM | POA: Diagnosis not present

## 2017-05-26 ENCOUNTER — Other Ambulatory Visit: Payer: Self-pay | Admitting: Internal Medicine

## 2017-05-26 DIAGNOSIS — C8400 Mycosis fungoides, unspecified site: Secondary | ICD-10-CM | POA: Diagnosis not present

## 2017-05-31 DIAGNOSIS — C8400 Mycosis fungoides, unspecified site: Secondary | ICD-10-CM | POA: Diagnosis not present

## 2017-06-01 DIAGNOSIS — Z7901 Long term (current) use of anticoagulants: Secondary | ICD-10-CM | POA: Diagnosis not present

## 2017-06-01 DIAGNOSIS — I483 Typical atrial flutter: Secondary | ICD-10-CM | POA: Diagnosis not present

## 2017-06-02 ENCOUNTER — Other Ambulatory Visit: Payer: Self-pay | Admitting: Internal Medicine

## 2017-06-02 DIAGNOSIS — C8400 Mycosis fungoides, unspecified site: Secondary | ICD-10-CM | POA: Diagnosis not present

## 2017-06-03 DIAGNOSIS — C8400 Mycosis fungoides, unspecified site: Secondary | ICD-10-CM | POA: Diagnosis not present

## 2017-06-06 ENCOUNTER — Other Ambulatory Visit: Payer: Self-pay | Admitting: Internal Medicine

## 2017-06-06 DIAGNOSIS — D2339 Other benign neoplasm of skin of other parts of face: Secondary | ICD-10-CM | POA: Diagnosis not present

## 2017-06-06 DIAGNOSIS — B079 Viral wart, unspecified: Secondary | ICD-10-CM | POA: Diagnosis not present

## 2017-06-06 DIAGNOSIS — R234 Changes in skin texture: Secondary | ICD-10-CM | POA: Diagnosis not present

## 2017-06-06 DIAGNOSIS — D485 Neoplasm of uncertain behavior of skin: Secondary | ICD-10-CM | POA: Diagnosis not present

## 2017-06-06 DIAGNOSIS — C8400 Mycosis fungoides, unspecified site: Secondary | ICD-10-CM | POA: Diagnosis not present

## 2017-06-07 DIAGNOSIS — C8400 Mycosis fungoides, unspecified site: Secondary | ICD-10-CM | POA: Diagnosis not present

## 2017-06-08 ENCOUNTER — Other Ambulatory Visit: Payer: Self-pay | Admitting: Internal Medicine

## 2017-06-09 DIAGNOSIS — C8400 Mycosis fungoides, unspecified site: Secondary | ICD-10-CM | POA: Diagnosis not present

## 2017-06-16 DIAGNOSIS — C84A0 Cutaneous T-cell lymphoma, unspecified, unspecified site: Secondary | ICD-10-CM | POA: Diagnosis not present

## 2017-06-16 DIAGNOSIS — Z79899 Other long term (current) drug therapy: Secondary | ICD-10-CM | POA: Diagnosis not present

## 2017-06-20 ENCOUNTER — Other Ambulatory Visit: Payer: Self-pay | Admitting: Internal Medicine

## 2017-06-21 DIAGNOSIS — M722 Plantar fascial fibromatosis: Secondary | ICD-10-CM | POA: Diagnosis not present

## 2017-06-21 DIAGNOSIS — L309 Dermatitis, unspecified: Secondary | ICD-10-CM | POA: Diagnosis not present

## 2017-06-21 DIAGNOSIS — E1142 Type 2 diabetes mellitus with diabetic polyneuropathy: Secondary | ICD-10-CM | POA: Diagnosis not present

## 2017-06-27 ENCOUNTER — Other Ambulatory Visit: Payer: Self-pay | Admitting: Internal Medicine

## 2017-06-27 NOTE — Telephone Encounter (Signed)
Refill for 30 days only.   

## 2017-06-27 NOTE — Telephone Encounter (Signed)
Refilled: 11/10/2016 Last OV: 05/04/2017 Next OV: 08/05/2017

## 2017-06-28 ENCOUNTER — Other Ambulatory Visit: Payer: Self-pay | Admitting: Internal Medicine

## 2017-06-28 NOTE — Telephone Encounter (Signed)
Printed, signed and faxed.  

## 2017-07-01 ENCOUNTER — Telehealth: Payer: Self-pay | Admitting: Internal Medicine

## 2017-07-01 NOTE — Telephone Encounter (Signed)
Attempted to call pt.; left vm to return call

## 2017-07-01 NOTE — Telephone Encounter (Signed)
This encounter was created in error - please disregard.

## 2017-07-01 NOTE — Telephone Encounter (Signed)
Pt. Called back and states he was calling about a refill of his Tramadol. This was sent on 06/27/17. Instructed pt. To check with his pharmacy. He is not sick as previously reported.

## 2017-07-01 NOTE — Telephone Encounter (Signed)
Copied from [place]. Topic: Quick Communication - See Telephone Encounter >> Jul 01, 2017 10:12 AM Hewitt Shorts wrote: CRM for notification. See Telephone encounter for: pt states he woke up dizzy and nauseated and wanted to know if something can be called in he understands that office Is closed and wont be handled possibly til Monday   Best number [phone]  07/01/17.

## 2017-07-20 ENCOUNTER — Ambulatory Visit (INDEPENDENT_AMBULATORY_CARE_PROVIDER_SITE_OTHER): Payer: PPO

## 2017-07-20 VITALS — BP 142/80 | HR 76 | Temp 98.3°F | Resp 16 | Ht 67.0 in | Wt 251.1 lb

## 2017-07-20 DIAGNOSIS — Z1331 Encounter for screening for depression: Secondary | ICD-10-CM

## 2017-07-20 DIAGNOSIS — Z0000 Encounter for general adult medical examination without abnormal findings: Secondary | ICD-10-CM | POA: Diagnosis not present

## 2017-07-20 DIAGNOSIS — C84A0 Cutaneous T-cell lymphoma, unspecified, unspecified site: Secondary | ICD-10-CM | POA: Diagnosis not present

## 2017-07-20 DIAGNOSIS — Z1159 Encounter for screening for other viral diseases: Secondary | ICD-10-CM

## 2017-07-20 DIAGNOSIS — Z79899 Other long term (current) drug therapy: Secondary | ICD-10-CM | POA: Diagnosis not present

## 2017-07-20 NOTE — Progress Notes (Signed)
Subjective:   Daniel Kline is a 71 y.o. male who presents for Medicare Annual/Subsequent preventive examination.  Review of Systems:  Cardiac Risk Factors include: hypertension;advanced age (>56men, >26 women);diabetes mellitus   No ROS.  Medicare Wellness Visit. Additional risk factors are reflected in the social history.    Objective:    Vitals: BP (!) 142/80 (BP Location: Left Arm, Patient Position: Sitting, Cuff Size: Normal)   Pulse 76   Temp 98.3 F (36.8 C) (Oral)   Resp 16   Ht 5\' 7"  (1.702 m)   Wt 251 lb 1.9 oz (113.9 kg)   SpO2 97%   BMI 39.33 kg/m   Body mass index is 39.33 kg/m.  Advanced Directives 07/20/2017 07/20/2016  Does Patient Have a Medical Advance Directive? Yes Yes  Type of Advance Directive Living will;Healthcare Power of [place];Living will  Does patient want to make changes to medical advance directive? No - Patient declined No - Patient declined  Copy of [place] in Chart? No - copy requested No - copy requested    Tobacco Social History   Tobacco Use  Smoking Status Former Smoker  . Last attempt to quit: 09/23/1990  . Years since quitting: 26.8  Smokeless Tobacco Former Systems developer  . Quit date: 02/25/2006  Tobacco Comment   tobacco use- no      Counseling given: Not Answered Comment: tobacco use- no    Clinical Intake:  Pre-visit preparation completed: Yes  Pain : No/denies pain     Nutritional Status: BMI > 30  Obese Diabetes: Yes(Followed by PCP)  Activities of Daily Living: Independent Ambulation: Independent Medication Administration: Independent Home Management: Independent     Do you feel unsafe in your current relationship?: No Do you feel physically threatened by others?: No Anyone hurting you at home, work, or school?: No Unable to ask?: No Information provided on Community resources: No  How often do you need to have someone help you when you read instructions,  pamphlets, or other written materials from your doctor or pharmacy?: 1 - Never  Interpreter Needed?: No     Past Medical History:  Diagnosis Date  . Arthritis   . Diabetes mellitus without complication ([place])   . HTN (hypertension)   . MRSA (methicillin resistant Staphylococcus aureus)   . Polio    Past Surgical History:  Procedure Laterality Date  . right elbow surgery    . right knee surgery    . right shoulder surgery    . spleenectomy    . TOTAL HIP ARTHROPLASTY  04/2016   Family History  Adopted: Yes   Social History   Socioeconomic History  . Marital status: Married    Spouse name: None  . Number of children: None  . Years of education: None  . Highest education level: None  Social Needs  . Financial resource strain: Not hard at all  . Food insecurity - worry: Never true  . Food insecurity - inability: Never true  . Transportation needs - medical: No  . Transportation needs - non-medical: No  Occupational History  . None  Tobacco Use  . Smoking status: Former Smoker    Last attempt to quit: 09/23/1990    Years since quitting: 26.8  . Smokeless tobacco: Former Systems developer    Quit date: 02/25/2006  . Tobacco comment: tobacco use- no   Substance and Sexual Activity  . Alcohol use: No    Alcohol/week: 0.0 oz  . Drug use:  No  . Sexual activity: Yes    Partners: Female  Other Topics Concern  . None  Social History Narrative   Retired.- banking and school teacher.     Outpatient Encounter Medications as of 07/20/2017  Medication Sig  . albuterol (PROVENTIL HFA) 108 (90 Base) MCG/ACT inhaler Inhale 2 puffs into the lungs every 6 (six) hours as needed for wheezing or shortness of breath.  Marland Kitchen amLODipine (NORVASC) 10 MG tablet TAKE ONE TABLET EVERY DAY  . aspirin (ASPIR-81) 81 MG EC tablet Take 81 mg by mouth daily. Reported on 10/28/2015  . carvedilol (COREG) 3.125 MG tablet TAKE ONE TABLET BY MOUTH TWICE DAILY WITH A MEAL  . chlorpheniramine-HYDROcodone (TUSSIONEX  PENNKINETIC ER) 10-8 MG/5ML SUER Take 5 mLs by mouth at bedtime as needed for cough.  . Cholecalciferol (VITAMIN D-3) 1000 units CAPS Take 2 capsules by mouth daily. Reported on 02/27/2016  . clobetasol cream (TEMOVATE) 1.02 % Apply 1 application topically 2 (two) times daily.   . clorazepate (TRANXENE) 7.5 MG tablet TAKE 1 TABLET BY MOUTH 3 TIMES DAILY AS NEEDED. NEED TO CALL & SCHEDULE OFFICE VISIT.  Marland Kitchen EPINEPHrine (EPIPEN 2-PAK) 0.3 mg/0.3 mL IJ SOAJ injection Inject 0.3 mLs (0.3 mg total) into the muscle once.  . famotidine (PEPCID) 20 MG tablet TAKE 1 TABLET BY MOUTH TWICE DAILY  . furosemide (LASIX) 20 MG tablet TAKE ONE TABLET BY MOUTH EVERY MORNING  . gabapentin (NEURONTIN) 300 MG capsule TAKE 1 CAPSULE BY MOUTH 3 TIMES DAILY  . hydrALAZINE (APRESOLINE) 25 MG tablet Take 1 tablet (25 mg total) by mouth 3 (three) times daily.  . hydrALAZINE (APRESOLINE) 25 MG tablet TAKE 1 TABLET BY MOUTH 3 TIMES DAILY  . hydrALAZINE (APRESOLINE) 50 MG tablet TAKE 1 TABLET BY MOUTH 3 TIMES DAILY  . hydrOXYzine (ATARAX/VISTARIL) 25 MG tablet Take 1 tablet (25 mg total) by mouth every 8 (eight) hours as needed.  . INTRON A 6000000 UNIT/ML injection   . JARDIANCE 10 MG TABS tablet TAKE ONE TABLET EVERY DAY  . latanoprost (XALATAN) 0.005 % ophthalmic solution Place 1 drop into the right eye at bedtime.  Marland Kitchen losartan (COZAAR) 100 MG tablet TAKE ONE TABLET EVERY DAY  . metFORMIN (GLUCOPHAGE) 500 MG tablet TAKE ONE TABLET TWICE DAILY  . montelukast (SINGULAIR) 10 MG tablet TAKE ONE TABLET BY MOUTH EVERY EVENING AT BEDTIME  . Multiple Vitamins-Minerals (MULTIVITAL) tablet Take 1 tablet by mouth daily.    . ONE TOUCH ULTRA TEST test strip TEST once daily or as directed  . potassium chloride SA (K-DUR,KLOR-CON) 20 MEQ tablet TAKE ONE TABLET EVERY DAY  . sitaGLIPtin (JANUVIA) 100 MG tablet Take 1 tablet (100 mg total) by mouth daily.  . traMADol (ULTRAM) 50 MG tablet TAKE ONE TABLET EVERY EIGHT HOURS AS NEEDED  .  [DISCONTINUED] HYDROcodone-acetaminophen (NORCO/VICODIN) 5-325 MG tablet Take 1 tablet by mouth 2 (two) times daily as needed for moderate pain.  Marland Kitchen apixaban (ELIQUIS) 5 MG TABS tablet Take 5 mg by mouth.   No facility-administered encounter medications on file as of 07/20/2017.     Activities of Daily Living In your present state of health, do you have any difficulty performing the following activities: 07/20/2017 07/20/2016  Hearing? N N  Vision? N N  Difficulty concentrating or making decisions? N N  Walking or climbing stairs? N N  Dressing or bathing? N N  Doing errands, shopping? N N  Preparing Food and eating ? N N  Using the Toilet? N N  In the past six months, have you accidently leaked urine? N N  Do you have problems with loss of bowel control? N N  Managing your Medications? N N  Managing your Finances? N N  Housekeeping or managing your Housekeeping? N N  Some recent data might be hidden    Patient Care Team: Crecencio Mc, MD as PCP - General (Internal Medicine)   Assessment:    This is a routine wellness examination for Zaydenn. The goal of the wellness visit is to assist the patient how to close the gaps in care and create a preventative care plan for the patient.   The roster of all physicians providing medical care to patient is listed in the Snapshot section of the chart.  Taking calcium VIT D as appropriate/Osteoporosis risk reviewed.    Safety issues reviewed; Smoke and carbon monoxide detectors in the home. Firearms locked up in the home. Wears seatbelts when driving or riding with others. Patient does wear sunscreen or protective clothing when in direct sunlight. No violence in the home.  Depression- PHQ 2 &9 complete.  No signs/symptoms or verbal communication regarding little pleasure in doing things, feeling down, depressed or hopeless. No changes in sleeping, energy, eating, concentrating.  No thoughts of self harm or harm towards others.  Time  spent on this topic is 8 minutes.   Patient is alert, normal appearance, oriented to person/place/and time.  Correctly identified the president of the [place], recall of 3/3 words, and performing simple calculations. Displays appropriate judgement and can read correct time from watch face.   No new identified risk were noted.  No failures at ADL's or IADL's.   BMI- discussed the importance of a healthy diet, water intake and the benefits of aerobic exercise. Educational material provided.   24 hour diet recall: He tries to eat a low carb foods Daily fluid intake: 2 cups of caffeine, 6-7 cups of water  Dental- dentures.  Eye- Visual acuity not assessed per patient preference since they have regular follow up with the ophthalmologist.  Wears corrective lenses.  Sleep patterns- Sleeps 5- hours at night.  Sleep test scheduled January 22.    Hepatitis C Screening discussed; future lab ordered per patient preference.  Educational material provided.  Patient Concerns: None at this time. Follow up with PCP as needed.   Exercise Activities and Dietary recommendations Current Exercise Habits: Home exercise routine, Time (Minutes): 60, Frequency (Times/Week): 3, Weekly Exercise (Minutes/Week): 180, Intensity: Moderate  Goals    . Increase physical activity     Exercise up to 5 days weekly by swimming and using weights      Fall Risk Fall Risk  07/20/2017 10/25/2016 07/20/2016  Falls in the past year? Yes No No  Number falls in past yr: 1 - -  Injury with Fall? No - -  Comment His dog got under his feet - -  Follow up Education provided;Falls prevention discussed - -   Depression Screen PHQ 2/9 Scores 07/20/2017 10/25/2016 07/20/2016  PHQ - 2 Score 0 0 0    Cognitive Function MMSE - Mini Mental State Exam 07/20/2017 07/20/2016  Orientation to time 5 5  Orientation to Place 5 5  Registration 3 3  Attention/ Calculation 5 5  Recall 3 3  Language- name 2 objects 2 2  Language-  repeat 1 1  Language- follow 3 step command 3 3  Language- read & follow direction 1 1  Write a sentence 1 1  Copy design  1 1  Total score 30 30        Immunization History  Administered Date(s) Administered  . Influenza Split 05/30/2015  . Influenza, High Dose Seasonal PF 05/02/2017  . Influenza-Unspecified 05/24/2016   Screening Tests Health Maintenance  Topic Date Due  . Hepatitis C Screening  1946-03-13  . TETANUS/TDAP  09/09/1963  . PNA vac Low Risk Adult (1 of 2 - PCV13) 09/08/2009  . FOOT EXAM  10/27/2016  . OPHTHALMOLOGY EXAM  11/10/2016  . HEMOGLOBIN A1C  11/01/2017  . COLONOSCOPY  09/30/2018  . INFLUENZA VACCINE  Completed      Plan:    End of life planning; Advance aging; Advanced directives discussed. Copy of current HCPOA/Living Will requested.    I have personally reviewed and noted the following in the patient's chart:   . Medical and social history . Use of alcohol, tobacco or illicit drugs  . Current medications and supplements . Functional ability and status . Nutritional status . Physical activity . Advanced directives . List of other physicians . Hospitalizations, surgeries, and ER visits in previous 12 months . Vitals . Screenings to include cognitive, depression, and falls . Referrals and appointments  In addition, I have reviewed and discussed with patient certain preventive protocols, quality metrics, and best practice recommendations. A written personalized care plan for preventive services as well as general preventive health recommendations were provided to patient.     OBrien-Blaney, Euclid Cassetta L, LPN  [id number]/[id number]   I have reviewed the above information and agree with above.   Deborra Medina, MD

## 2017-07-20 NOTE — Patient Instructions (Addendum)
  Mr. Situ , Thank you for taking time to come for your Medicare Wellness Visit. I appreciate your ongoing commitment to your health goals. Please review the following plan we discussed and let me know if I can assist you in the future.   Fasting for next appt with Dr. Derrel Nip.  These are the goals we discussed: Goals    . Increase physical activity     Exercise up to 5 days weekly by swimming and using weights       This is a list of the screening recommended for you and due dates:  Health Maintenance  Topic Date Due  .  Hepatitis C: One time screening is recommended by Center for Disease Control  (CDC) for  adults born from [birth year] through 1966.   02-22-1946  . Tetanus Vaccine  09/09/1963  . Pneumonia vaccines (1 of 2 - PCV13) 09/08/2009  . Complete foot exam   10/27/2016  . Eye exam for diabetics  11/10/2016  . Hemoglobin A1C  11/01/2017  . Colon Cancer Screening  09/30/2018  . Flu Shot  Completed

## 2017-07-22 ENCOUNTER — Other Ambulatory Visit: Payer: Self-pay | Admitting: Internal Medicine

## 2017-07-26 DIAGNOSIS — L570 Actinic keratosis: Secondary | ICD-10-CM | POA: Insufficient documentation

## 2017-07-26 DIAGNOSIS — B079 Viral wart, unspecified: Secondary | ICD-10-CM | POA: Diagnosis not present

## 2017-07-26 DIAGNOSIS — L858 Other specified epidermal thickening: Secondary | ICD-10-CM | POA: Insufficient documentation

## 2017-07-26 DIAGNOSIS — R58 Hemorrhage, not elsewhere classified: Secondary | ICD-10-CM | POA: Diagnosis not present

## 2017-07-26 DIAGNOSIS — C84A0 Cutaneous T-cell lymphoma, unspecified, unspecified site: Secondary | ICD-10-CM | POA: Diagnosis not present

## 2017-07-29 DIAGNOSIS — R58 Hemorrhage, not elsewhere classified: Secondary | ICD-10-CM | POA: Diagnosis not present

## 2017-08-01 ENCOUNTER — Other Ambulatory Visit: Payer: Self-pay | Admitting: Internal Medicine

## 2017-08-04 ENCOUNTER — Other Ambulatory Visit: Payer: Self-pay

## 2017-08-05 ENCOUNTER — Encounter: Payer: Self-pay | Admitting: Internal Medicine

## 2017-08-05 ENCOUNTER — Ambulatory Visit: Payer: PPO | Admitting: Internal Medicine

## 2017-08-05 VITALS — BP 152/84 | HR 80 | Temp 98.0°F | Resp 16 | Ht 67.0 in | Wt 248.0 lb

## 2017-08-05 DIAGNOSIS — C84A0 Cutaneous T-cell lymphoma, unspecified, unspecified site: Secondary | ICD-10-CM

## 2017-08-05 DIAGNOSIS — R5383 Other fatigue: Secondary | ICD-10-CM

## 2017-08-05 DIAGNOSIS — E1121 Type 2 diabetes mellitus with diabetic nephropathy: Secondary | ICD-10-CM

## 2017-08-05 DIAGNOSIS — Z79899 Other long term (current) drug therapy: Secondary | ICD-10-CM

## 2017-08-05 DIAGNOSIS — E7800 Pure hypercholesterolemia, unspecified: Secondary | ICD-10-CM

## 2017-08-05 DIAGNOSIS — R29818 Other symptoms and signs involving the nervous system: Secondary | ICD-10-CM

## 2017-08-05 LAB — COMPREHENSIVE METABOLIC PANEL
ALBUMIN: 4.3 g/dL (ref 3.5–5.2)
ALT: 19 U/L (ref 0–53)
AST: 18 U/L (ref 0–37)
Alkaline Phosphatase: 64 U/L (ref 39–117)
BUN: 21 mg/dL (ref 6–23)
CHLORIDE: 98 meq/L (ref 96–112)
CO2: 33 meq/L — AB (ref 19–32)
CREATININE: 1.43 mg/dL (ref 0.40–1.50)
Calcium: 9.8 mg/dL (ref 8.4–10.5)
GFR: 51.54 mL/min — ABNORMAL LOW (ref >60.00)
GLUCOSE: 173 mg/dL — AB (ref 70–99)
POTASSIUM: 4.2 meq/L (ref 3.5–5.1)
SODIUM: 137 meq/L (ref 135–145)
Total Bilirubin: 0.5 mg/dL (ref 0.2–1.2)
Total Protein: 7.6 g/dL (ref 6.0–8.3)

## 2017-08-05 LAB — MICROALBUMIN / CREATININE URINE RATIO
Creatinine,U: 26.1 mg/dL
MICROALB UR: 15.3 mg/dL — AB (ref 0.0–1.9)
Microalb Creat Ratio: 58.7 mg/g — ABNORMAL HIGH (ref 0.0–30.0)

## 2017-08-05 LAB — LIPID PANEL
CHOL/HDL RATIO: 4
CHOLESTEROL: 166 mg/dL (ref 0–200)
HDL: 39.5 mg/dL (ref >39.00)
LDL CALC: 94 mg/dL (ref 0–99)
NonHDL: 126.47
Triglycerides: 162 mg/dL — ABNORMAL HIGH (ref 0.0–149.0)
VLDL: 32.4 mg/dL (ref 0.0–40.0)

## 2017-08-05 LAB — HEMOGLOBIN A1C: HEMOGLOBIN A1C: 7.8 % — AB (ref 4.6–6.5)

## 2017-08-05 MED ORDER — HYDROCODONE-ACETAMINOPHEN 5-325 MG PO TABS: 1.0000 | ORAL_TABLET | Freq: Two times a day (BID) | ORAL | 0 refills | Status: DC | PRN

## 2017-08-05 NOTE — Patient Instructions (Addendum)
If your home BP readings are > 150/80 consistently,  Please increased your dose of carvedilol  to 2 tablets twice daily    Your weight loss will occur when you take in fewer calories than you burn. .  This is achieved by  Eating less  OR   Exercising more (goal is 30 minutes five days per week )

## 2017-08-05 NOTE — Progress Notes (Signed)
Subjective:  Patient ID: Daniel Kline, male    DOB: [birth year]  Age: 71 y.o. MRN: [id number]  CC: The primary encounter diagnosis was Diabetes mellitus with nephropathy ([place]). 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  HPI LEVERETT CAMPLIN presents for 3 month follow up on diabetes.  Patient has no complaints today.  Patient is following a low glycemic index diet and taking all prescribed medications regularly without side effects.  Fasting sugars have been under less than 140 most of the time and post prandials have been under 160 except on rare occasions. Patient is swimming  Several times per week but has not exercised in the past month .  He states that he is  intentionally trying to lose weight .  Patient has had an eye exam in the last 12 months and checks feet regularly for signs of infection.  Patient does not walk barefoot outside,  And denies an numbness tingling or burning in feet. Patient is up to date on all recommended vaccinations  BP still elevated.  Taking losartan, carvedilol, hydralazine and amlodipine    His  Total right knee replacement  has been postponed  Since July 2018 due to the  Preoperative evaluation  That detected  atrial flutter and suspected OSA.  His surgeon is Magda Bernheim in [place]  Hypersomnolence: he is having difficulty staying awake with passive tasks including driving distances greater than 20 minutes.  sleep [facility] has [facility]  [facility]    It Has been rescheduled for Jan 22  Due to [employer] coverage issues.  Switching to [place] in January .   Very motivated to scuba dive this summer . Needs a form signed by one of his providers clearing him to participate. "I want to swim with the sharks."     Outpatient Medications Prior to Visit    Medication Sig Dispense Refill  . amLODipine (NORVASC) 10 MG tablet TAKE ONE TABLET EVERY DAY 90 tablet 1  . carvedilol (COREG) 3.125 MG tablet TAKE ONE TABLET BY MOUTH TWICE DAILY WITH A MEAL 60 tablet 3  . Cholecalciferol (VITAMIN D-3) 1000 units CAPS Take 2 capsules by mouth daily. Reported on 02/27/2016    . clobetasol cream (TEMOVATE) 6.06 % Apply 1 application topically 2 (two) times daily.     . clorazepate (TRANXENE) 7.5 MG tablet TAKE 1 TABLET BY MOUTH 3 TIMES DAILY AS NEEDED. NEED TO CALL & SCHEDULE OFFICE VISIT. 90 tablet 1  . EPINEPHrine (EPIPEN 2-PAK) 0.3 mg/0.3 mL IJ SOAJ injection Inject 0.3 mLs (0.3 mg total) into the muscle once. 1 Device 1  . famotidine (PEPCID) 20 MG tablet TAKE 1 TABLET BY MOUTH TWICE DAILY 60 tablet 5  . furosemide (LASIX) 20 MG tablet TAKE ONE TABLET BY MOUTH EVERY MORNING 30 tablet 3  . gabapentin (NEURONTIN) 300 MG capsule TAKE 1 CAPSULE BY MOUTH 3 TIMES DAILY 90 capsule 2  . hydrALAZINE (APRESOLINE) 25 MG tablet Take 1 tablet (25 mg total) by mouth 3 (three) times daily. 90 tablet 1  . hydrALAZINE (APRESOLINE) 50 MG tablet TAKE ONE TABLET 3 TIMES DAILY 90 tablet 1  . hydrOXYzine (ATARAX/VISTARIL) 25 MG tablet Take 1 tablet (25 mg total) by mouth every 8 (eight) hours as needed. 90 tablet 3  . JARDIANCE 10 MG TABS tablet TAKE ONE TABLET EVERY  DAY 90 tablet 2  . latanoprost (XALATAN) 0.005 % ophthalmic solution Place 1 drop into the right eye at bedtime.    Marland Kitchen losartan (COZAAR) 100 MG tablet TAKE ONE TABLET EVERY DAY 90 tablet 1  . metFORMIN (GLUCOPHAGE) 500 MG tablet TAKE 1 TABLET BY MOUTH TWICE DAILY 60 tablet 1  . montelukast (SINGULAIR) 10 MG tablet TAKE ONE TABLET BY MOUTH EVERY EVENING AT BEDTIME 30 tablet 3  . Multiple Vitamins-Minerals (MULTIVITAL) tablet Take 1 tablet by mouth daily.      . ONE TOUCH ULTRA TEST test strip TEST once daily or as directed 100 each 1  . potassium chloride SA (K-DUR,KLOR-CON) 20 MEQ tablet TAKE ONE TABLET EVERY DAY 90  tablet 1  . sitaGLIPtin (JANUVIA) 100 MG tablet Take 1 tablet (100 mg total) by mouth daily. 90 tablet 0  . traMADol (ULTRAM) 50 MG tablet TAKE ONE TABLET EVERY EIGHT HOURS AS NEEDED 90 tablet 5  . albuterol (PROVENTIL HFA) 108 (90 Base) MCG/ACT inhaler Inhale 2 puffs into the lungs every 6 (six) hours as needed for wheezing or shortness of breath. (Patient not taking: Reported on 08/05/2017) 1 Inhaler 1  . apixaban (ELIQUIS) 5 MG TABS tablet Take 5 mg by mouth.    . chlorpheniramine-HYDROcodone (TUSSIONEX PENNKINETIC ER) 10-8 MG/5ML SUER Take 5 mLs by mouth at bedtime as needed for cough. (Patient not taking: Reported on 08/05/2017) 70 mL 0  . INTRON A 6000000 UNIT/ML injection     . aspirin (ASPIR-81) 81 MG EC tablet Take 81 mg by mouth daily. Reported on 10/28/2015     No facility-administered medications prior to visit.     Review of Systems;  Patient denies headache, fevers, malaise, unintentional weight loss, , eye pain, sinus congestion and sinus pain, sore throat, dysphagia,  hemoptysis , cough, dyspnea, wheezing, chest pain, palpitations, orthopnea, edema, abdominal pain, nausea, melena, diarrhea, constipation, flank pain, dysuria, hematuria, urinary  Frequency, nocturia, numbness, tingling, seizures,  Focal weakness, Loss of consciousness,  Tremor, insomnia, depression, anxiety, and suicidal ideation.      Objective:  BP (!) 152/84 (BP Location: Left Arm, Patient Position: Sitting, Cuff Size: Large)   Pulse 80   Temp 98 F (36.7 C) (Oral)   Resp 16   Ht 5\' 7"  (1.702 m)   Wt 248 lb (112.5 kg)   SpO2 95%   BMI 38.84 kg/m   BP Readings from Last 3 Encounters:  08/05/17 (!) 152/84  07/20/17 (!) 142/80  05/04/17 140/82    Wt Readings from Last 3 Encounters:  08/05/17 248 lb (112.5 kg)  07/20/17 251 lb 1.9 oz (113.9 kg)  05/04/17 241 lb (109.3 kg)    General appearance: alert, cooperative and appears stated age Ears: normal TM's and external ear canals both ears Throat:  lips, mucosa, and tongue normal; teeth and gums normal Neck: no adenopathy, no carotid bruit, supple, symmetrical, trachea midline and thyroid not enlarged, symmetric, no tenderness/mass/nodules Back: symmetric, no curvature. ROM normal. No CVA tenderness. Lungs: clear to auscultation bilaterally Heart: regular rate and rhythm, S1, S2 normal, no murmur, click, rub or gallop Abdomen: obese, soft, non-tender; bowel sounds normal; no masses,  no organomegaly Pulses: 2+ and symmetric Skin: Skin color, texture, turgor normal. No rashes or lesions Lymph nodes: Cervical, supraclavicular, and axillary nodes normal.   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  He has been asked to submit monthly cbc, cmet per last OV note.       Relevant Medications   HYDROcodone-acetaminophen (NORCO/VICODIN) 5-325 MG tablet   HYDROcodone-acetaminophen (NORCO/VICODIN) 5-325 MG tablet   Diabetes mellitus with nephropathy ([place]) - Primary    Worsening control with medication change to [place], which was done due to the cost of jardiance.  Will add glipzide 2.5 mg bid  Ac.  continue asa for primary prevention,  losartan for proteinuria   Lab Results  Component Value Date   HGBA1C 7.8 (H) 08/05/2017   Lab Results  Component Value Date   MICROALBUR 15.3 (H) 08/05/2017         Relevant Medications   glipiZIDE (GLUCOTROL) 5 MG tablet   rosuvastatin (CRESTOR) 20 MG tablet   Other Relevant Orders   Hemoglobin A1c (Completed)   Comprehensive metabolic panel (Completed)   Lipid panel (Completed)   Microalbumin / creatinine urine ratio (Completed)   Hypercholesterolemia    Using the Framingham risk calculator,  his 10 year risk of coronary artery disease is very elevated at 50%.  Will again recommend high  potency statin therapy with crestor 20 mg .   Continue daily asa   Lab Results  Component Value Date   CHOL 166 08/05/2017   HDL 39.50 08/05/2017   LDLCALC 94 08/05/2017   LDLDIRECT 94.0 01/27/2017   TRIG 162.0 (H) 08/05/2017   CHOLHDL 4 08/05/2017         Relevant Medications   rosuvastatin (CRESTOR) 20 MG tablet   Long-term use of high-risk medication    Monthly cbc and CMEt have been requested by his dermatologist but have not been done.  Lab Results  Component Value Date   WBC 5.5 01/27/2017   HGB 14.2 01/27/2017   HCT 43.7 01/27/2017   MCV 88.8 01/27/2017   PLT 175.0 01/27/2017   Lab Results  Component Value Date   ALT 19 08/05/2017   AST 18 08/05/2017   ALKPHOS 64 08/05/2017   BILITOT 0.5 08/05/2017  '      Relevant Orders   CBC with Differential/Platelet   Comprehensive metabolic panel   Suspected sleep apnea    With daytime somnolence, uncontrolled hypertension,  And new onset atrial flutter. Sleep study Is scheduled for late January and surgery is postponed until therapy is underway       Other Visit Diagnoses    Fatigue, unspecified type       Relevant Orders   Hepatitis C antibody (Completed)     A total of 40 minutes of face to face time was spent with patient more than half of which was spent in counselling about the above mentioned conditions, reviewing records from other providers,   and coordination of care   I have discontinued Cliffard T. Bjorklund's aspirin. I am also having him start on glipiZIDE and rosuvastatin. Additionally, I am having him maintain his MULTIVITAL, clobetasol cream, latanoprost, Vitamin D-3, EPINEPHrine, INTRON A, ONE TOUCH ULTRA TEST, famotidine, chlorpheniramine-HYDROcodone, albuterol, hydrALAZINE, hydrOXYzine, losartan, montelukast, potassium chloride SA, JARDIANCE, furosemide, apixaban, carvedilol, sitaGLIPtin, amLODipine, clorazepate, traMADol, gabapentin, hydrALAZINE, metFORMIN, HYDROcodone-acetaminophen, and  HYDROcodone-acetaminophen.  Meds ordered this encounter  Medications  . HYDROcodone-acetaminophen (NORCO/VICODIN) 5-325 MG tablet    Sig: Take 1 tablet by mouth 2 (two) times daily as needed for moderate pain.    Dispense:  60 tablet    Refill:  0  . HYDROcodone-acetaminophen (NORCO/VICODIN) 5-325 MG  tablet    Sig: Take 1 tablet by mouth 2 (two) times daily as needed for moderate pain.    Dispense:  60 tablet    Refill:  0    For chronic knee pain:  May refill on or after  September 05 2017  . glipiZIDE (GLUCOTROL) 5 MG tablet    Sig: Take 0.5 tablets (2.5 mg total) by mouth 2 (two) times daily before a meal.    Dispense:  60 tablet    Refill:  3  . rosuvastatin (CRESTOR) 20 MG tablet    Sig: Take 1 tablet (20 mg total) by mouth daily.    Dispense:  3 tablet    Refill:  3    Medications Discontinued During This Encounter  Medication Reason  . aspirin (ASPIR-81) 81 MG EC tablet Patient has not taken in last 30 days    Follow-up: No Follow-up on file.   Crecencio Mc, MD

## 2017-08-06 LAB — HEPATITIS C ANTIBODY
HEP C AB: NONREACTIVE
SIGNAL TO CUT-OFF: 0.01 (ref <1.00)

## 2017-08-07 DIAGNOSIS — Z79899 Other long term (current) drug therapy: Secondary | ICD-10-CM | POA: Insufficient documentation

## 2017-08-07 DIAGNOSIS — R29818 Other symptoms and signs involving the nervous system: Secondary | ICD-10-CM | POA: Insufficient documentation

## 2017-08-07 MED ORDER — ROSUVASTATIN CALCIUM 20 MG PO TABS: 20.0000 mg | ORAL_TABLET | Freq: Every day | ORAL | 3 refills | Status: DC

## 2017-08-07 MED ORDER — GLIPIZIDE 5 MG PO TABS: 2.5000 mg | ORAL_TABLET | Freq: Two times a day (BID) | ORAL | 3 refills | Status: DC

## 2017-08-07 NOTE — Assessment & Plan Note (Addendum)
Worsening control with medication change to [place], which was done due to the cost of jardiance.  Will add glipzide 2.5 mg bid  Ac.  continue asa for primary prevention,  losartan for proteinuria   Lab Results  Component Value Date   HGBA1C 7.8 (H) 08/05/2017   Lab Results  Component Value Date   MICROALBUR 15.3 (H) 08/05/2017

## 2017-08-07 NOTE — Assessment & Plan Note (Signed)
With daytime somnolence, uncontrolled hypertension,  And new onset atrial flutter. Sleep study Is scheduled for late January and surgery is postponed until therapy is underway

## 2017-08-07 NOTE — Assessment & Plan Note (Addendum)
Monthly cbc and CMEt have been requested by his dermatologist but have not been done.  Lab Results  Component Value Date   WBC 5.5 01/27/2017   HGB 14.2 01/27/2017   HCT 43.7 01/27/2017   MCV 88.8 01/27/2017   PLT 175.0 01/27/2017   Lab Results  Component Value Date   ALT 19 08/05/2017   AST 18 08/05/2017   ALKPHOS 64 08/05/2017   BILITOT 0.5 08/05/2017  '

## 2017-08-07 NOTE — Assessment & Plan Note (Signed)
Using the Framingham risk calculator,  his 10 year risk of coronary artery disease is very elevated at 50%.  Will again recommend high potency statin therapy with crestor 20 mg .   Continue daily asa   Lab Results  Component Value Date   CHOL 166 08/05/2017   HDL 39.50 08/05/2017   LDLCALC 94 08/05/2017   LDLDIRECT 94.0 01/27/2017   TRIG 162.0 (H) 08/05/2017   CHOLHDL 4 08/05/2017

## 2017-08-07 NOTE — Assessment & Plan Note (Signed)
managed by [facility] , last OV June 2018, with weekly methotrexate and folic acid,  And topical clobetasol alternating with triamcinolone.  He has been asked to submit monthly cbc, cmet per last OV note.

## 2017-08-09 DIAGNOSIS — Z86018 Personal history of other benign neoplasm: Secondary | ICD-10-CM

## 2017-08-09 HISTORY — DX: Personal history of other benign neoplasm: Z86.018

## 2017-08-09 HISTORY — PX: CARDIAC ELECTROPHYSIOLOGY STUDY AND ABLATION: SHX1294

## 2017-08-10 ENCOUNTER — Telehealth: Payer: Self-pay | Admitting: Internal Medicine

## 2017-08-10 NOTE — Telephone Encounter (Signed)
Pt is changing to mail order pharmacy. Pt is requsting that all his medications are sent through mail order, [employer], card in system.

## 2017-08-11 NOTE — Telephone Encounter (Signed)
Spoke with pt and he stated that he does not need any refills at this time just wanted to let us know that he was changing pharmacies.

## 2017-08-12 ENCOUNTER — Telehealth: Payer: Self-pay

## 2017-08-12 ENCOUNTER — Telehealth: Payer: Self-pay | Admitting: Internal Medicine

## 2017-08-12 DIAGNOSIS — I129 Hypertensive chronic kidney disease with stage 1 through stage 4 chronic kidney disease, or unspecified chronic kidney disease: Secondary | ICD-10-CM | POA: Diagnosis not present

## 2017-08-12 DIAGNOSIS — N189 Chronic kidney disease, unspecified: Secondary | ICD-10-CM | POA: Diagnosis not present

## 2017-08-12 DIAGNOSIS — I10 Essential (primary) hypertension: Secondary | ICD-10-CM | POA: Diagnosis not present

## 2017-08-12 DIAGNOSIS — R600 Localized edema: Secondary | ICD-10-CM | POA: Diagnosis not present

## 2017-08-12 MED ORDER — FUROSEMIDE 20 MG PO TABS: 20.0000 mg | ORAL_TABLET | Freq: Every morning | ORAL | 3 refills | Status: DC

## 2017-08-12 NOTE — Telephone Encounter (Signed)
Sending clorazepate back to provider to fill  Filling Lasix OV: 08/05/17 and LR:04/18/17 30# 3 refills

## 2017-08-12 NOTE — Telephone Encounter (Signed)
He can increase his lasix to 2 tablets daily for 3 days,  And cut back on water as recommended by his sleep dotory,  Then reduc e the lasix  dose back to 1 tablet

## 2017-08-12 NOTE — Telephone Encounter (Signed)
Patient is having swelling in the calves of his legs down to his feet. Patient is wearing compression stockings. Patient is drinking 60 oz of water a day. He is wondering if he is drinking too much water or if he should increase his lasix. Patient saw the sleep doctor, Dr. Weston Brass, and he suggested that the patient cut out about 20 oz of water a day. Patient is not having swelling anywhere else and denies any pain in his legs. Legs and feet are equally swollen.   Copied from [place]. Topic: General - Other >> Aug 12, 2017  1:38 PM Carolyn Stare wrote:   Pt cal lto say he is having swelling and he thinks the swelling is coming from him drinking 62 oz of water . He is asking if he should drink less watr or increase the lasix  Would like a call back   [phone]

## 2017-08-12 NOTE — Telephone Encounter (Signed)
Copied from [place] [phone]. Topic: Quick Communication - Rx Refill/Question >> Aug 12, 2017  8:27 AM Yvette Rack wrote: Has the patient contacted their pharmacy? No.   (Agent: If no, request that the patient contact the pharmacy for the refill.) clorazepate (TRANXENE) 7.5 MG tablet furosemide (LASIX) 20 MG tablet   Preferred Pharmacy (with phone number or street name): [place], [place] - [address] [phone] (Phone) [phone] (Fax)     Agent: Please be advised that RX refills may take up to 3 business days. We ask that you follow-up with your pharmacy.

## 2017-08-12 NOTE — Telephone Encounter (Signed)
LMTCB

## 2017-08-15 ENCOUNTER — Other Ambulatory Visit: Payer: Self-pay

## 2017-08-15 ENCOUNTER — Emergency Department: Payer: Medicare Other

## 2017-08-15 ENCOUNTER — Emergency Department
Admission: EM | Admit: 2017-08-15 | Discharge: 2017-08-16 | Disposition: A | Discharge status: Home / Self Care | Payer: Medicare Other | Attending: Emergency Medicine | Admitting: Emergency Medicine

## 2017-08-15 DIAGNOSIS — Z7901 Long term (current) use of anticoagulants: Secondary | ICD-10-CM | POA: Diagnosis not present

## 2017-08-15 DIAGNOSIS — Z7984 Long term (current) use of oral hypoglycemic drugs: Secondary | ICD-10-CM | POA: Insufficient documentation

## 2017-08-15 DIAGNOSIS — Z87891 Personal history of nicotine dependence: Secondary | ICD-10-CM | POA: Insufficient documentation

## 2017-08-15 DIAGNOSIS — I129 Hypertensive chronic kidney disease with stage 1 through stage 4 chronic kidney disease, or unspecified chronic kidney disease: Secondary | ICD-10-CM | POA: Diagnosis not present

## 2017-08-15 DIAGNOSIS — M7918 Myalgia, other site: Secondary | ICD-10-CM | POA: Insufficient documentation

## 2017-08-15 DIAGNOSIS — M6281 Muscle weakness (generalized): Secondary | ICD-10-CM | POA: Diagnosis not present

## 2017-08-15 DIAGNOSIS — Z79899 Other long term (current) drug therapy: Secondary | ICD-10-CM | POA: Insufficient documentation

## 2017-08-15 DIAGNOSIS — R52 Pain, unspecified: Secondary | ICD-10-CM

## 2017-08-15 DIAGNOSIS — N182 Chronic kidney disease, stage 2 (mild): Secondary | ICD-10-CM | POA: Diagnosis not present

## 2017-08-15 DIAGNOSIS — M2550 Pain in unspecified joint: Secondary | ICD-10-CM | POA: Diagnosis not present

## 2017-08-15 DIAGNOSIS — M25551 Pain in right hip: Secondary | ICD-10-CM | POA: Diagnosis not present

## 2017-08-15 DIAGNOSIS — E1122 Type 2 diabetes mellitus with diabetic chronic kidney disease: Secondary | ICD-10-CM | POA: Insufficient documentation

## 2017-08-15 DIAGNOSIS — M25552 Pain in left hip: Secondary | ICD-10-CM | POA: Diagnosis not present

## 2017-08-15 DIAGNOSIS — R509 Fever, unspecified: Secondary | ICD-10-CM | POA: Diagnosis not present

## 2017-08-15 DIAGNOSIS — R410 Disorientation, unspecified: Secondary | ICD-10-CM | POA: Insufficient documentation

## 2017-08-15 LAB — URINALYSIS, COMPLETE (UACMP) WITH MICROSCOPIC
Bacteria, UA: NONE SEEN
Bilirubin Urine: NEGATIVE
Glucose, UA: NEGATIVE mg/dL
Ketones, ur: NEGATIVE mg/dL
Leukocytes, UA: NEGATIVE
Nitrite: NEGATIVE
Protein, ur: 100 mg/dL — AB
Specific Gravity, Urine: 1.012 (ref 1.005–1.030)
Squamous Epithelial / HPF: NONE SEEN
pH: 6 (ref 5.0–8.0)

## 2017-08-15 LAB — CBC WITH DIFFERENTIAL/PLATELET
Basophils Absolute: 0 10*3/uL (ref 0–0.1)
Basophils Relative: 0 %
EOS PCT: 0 %
Eosinophils Absolute: 0 10*3/uL (ref 0–0.7)
HCT: 41.7 % (ref 40.0–52.0)
Hemoglobin: 13.9 g/dL (ref 13.0–18.0)
LYMPHS ABS: 0.4 10*3/uL — AB (ref 1.0–3.6)
LYMPHS PCT: 3 %
MCH: 28.9 pg (ref 26.0–34.0)
MCHC: 33.3 g/dL (ref 32.0–36.0)
MCV: 87 fL (ref 80.0–100.0)
MONO ABS: 1 10*3/uL (ref 0.2–1.0)
Monocytes Relative: 6 %
Neutro Abs: 14.3 10*3/uL — ABNORMAL HIGH (ref 1.4–6.5)
Neutrophils Relative %: 91 %
Platelets: 182 10*3/uL (ref 150–440)
RBC: 4.79 MIL/uL (ref 4.40–5.90)
RDW: 14.9 % — AB (ref 11.5–14.5)
WBC: 15.7 10*3/uL — ABNORMAL HIGH (ref 3.8–10.6)

## 2017-08-15 LAB — COMPREHENSIVE METABOLIC PANEL WITH GFR
ALT: 25 U/L (ref 17–63)
AST: 31 U/L (ref 15–41)
Albumin: 4 g/dL (ref 3.5–5.0)
Alkaline Phosphatase: 52 U/L (ref 38–126)
Anion gap: 9 (ref 5–15)
BUN: 26 mg/dL — ABNORMAL HIGH (ref 6–20)
CO2: 25 mmol/L (ref 22–32)
Calcium: 9.4 mg/dL (ref 8.9–10.3)
Chloride: 101 mmol/L (ref 101–111)
Creatinine, Ser: 1.75 mg/dL — ABNORMAL HIGH (ref 0.61–1.24)
GFR calc Af Amer: 43 mL/min — ABNORMAL LOW
GFR calc non Af Amer: 37 mL/min — ABNORMAL LOW
Glucose, Bld: 192 mg/dL — ABNORMAL HIGH (ref 65–99)
Potassium: 4.1 mmol/L (ref 3.5–5.1)
Sodium: 135 mmol/L (ref 135–145)
Total Bilirubin: 0.7 mg/dL (ref 0.3–1.2)
Total Protein: 7.5 g/dL (ref 6.5–8.1)

## 2017-08-15 LAB — LACTIC ACID, PLASMA: LACTIC ACID, VENOUS: 1.4 mmol/L (ref 0.5–1.9)

## 2017-08-15 LAB — PROTIME-INR
INR: 0.99
Prothrombin Time: 13 s (ref 11.4–15.2)

## 2017-08-15 NOTE — ED Triage Notes (Signed)
Per ems AMS x3-4 hours. Pt oriented to name and dob. Able to answer some questions appropriately. Pt given 650mg  tylenol in route.

## 2017-08-15 NOTE — ED Notes (Signed)
Pt has a fever and ams today.  Pt was brought in via ems from home.  No n/v/d  No headache.  Pt alert and speech clear.  Family with pt.  Sinus on monitor  Iv in place.

## 2017-08-16 ENCOUNTER — Telehealth: Payer: Self-pay | Admitting: *Deleted

## 2017-08-16 LAB — BLOOD CULTURE ID PANEL (REFLEXED)
Acinetobacter baumannii: NOT DETECTED
CANDIDA KRUSEI: NOT DETECTED
CANDIDA PARAPSILOSIS: NOT DETECTED
CANDIDA TROPICALIS: NOT DETECTED
Candida albicans: NOT DETECTED
Candida glabrata: NOT DETECTED
ESCHERICHIA COLI: NOT DETECTED
Enterobacter cloacae complex: NOT DETECTED
Enterobacteriaceae species: NOT DETECTED
Enterococcus species: NOT DETECTED
HAEMOPHILUS INFLUENZAE: NOT DETECTED
KLEBSIELLA OXYTOCA: NOT DETECTED
KLEBSIELLA PNEUMONIAE: NOT DETECTED
LISTERIA MONOCYTOGENES: NOT DETECTED
Neisseria meningitidis: NOT DETECTED
PROTEUS SPECIES: NOT DETECTED
Pseudomonas aeruginosa: NOT DETECTED
SERRATIA MARCESCENS: NOT DETECTED
STAPHYLOCOCCUS AUREUS BCID: NOT DETECTED
STAPHYLOCOCCUS SPECIES: NOT DETECTED
STREPTOCOCCUS PYOGENES: NOT DETECTED
STREPTOCOCCUS SPECIES: DETECTED — AB
Streptococcus agalactiae: NOT DETECTED
Streptococcus pneumoniae: NOT DETECTED

## 2017-08-16 LAB — INFLUENZA PANEL BY PCR (TYPE A & B)
Influenza A By PCR: NEGATIVE
Influenza B By PCR: NEGATIVE

## 2017-08-16 MED ORDER — IBUPROFEN 800 MG PO TABS
800.0000 mg | ORAL_TABLET | Freq: Once | ORAL | Status: AC
  Administered 2017-08-16: 800 mg via ORAL
  Filled 2017-08-16: qty 1

## 2017-08-16 MED ORDER — SODIUM CHLORIDE 0.9 % IV BOLUS (SEPSIS)
1000.0000 mL | Freq: Once | INTRAVENOUS | Status: AC
  Administered 2017-08-16: 1000 mL via INTRAVENOUS

## 2017-08-16 MED ORDER — CEFTRIAXONE SODIUM IN DEXTROSE 20 MG/ML IV SOLN
1.0000 g | Freq: Once | INTRAVENOUS | Status: AC
  Administered 2017-08-16: 1 g via INTRAVENOUS
  Filled 2017-08-16: qty 50

## 2017-08-16 MED ORDER — ACETAMINOPHEN 325 MG PO TABS
650.0000 mg | ORAL_TABLET | Freq: Once | ORAL | Status: AC
  Administered 2017-08-16: 650 mg via ORAL
  Filled 2017-08-16: qty 2

## 2017-08-16 NOTE — ED Provider Notes (Signed)
104 blood cultures came back positive for gram-positive cocci strep species will call the patient back and see how he is doing if he is perfectly well and follow-up with his doctor tomorrow if not he should come back.   Nena Polio, MD 08/16/17 [phone]

## 2017-08-16 NOTE — ED Notes (Signed)
Pt watching tv.  Family with pt.  nsr on monitor.

## 2017-08-16 NOTE — ED Notes (Signed)
Report off to sherie rn 

## 2017-08-16 NOTE — ED Notes (Signed)
Per Dr. Cinda Quest pt called , if his sx worsened to come back to the ED for eval if he is feeling better the pt can follow up with his PCP tomorrow. Pt called on his mobile number listed , states he is feeling better and has tried to call his PCP to follow up with . I informed the pt if his has not heard back from his PCP by tomorrow morning and was having concerns or not feeling well to return to the ED.

## 2017-08-16 NOTE — Discharge Instructions (Signed)
Please follow up with our primary care physician for further evaluation

## 2017-08-16 NOTE — Telephone Encounter (Signed)
Copied from [place] #[id number]. Topic: Appointment Scheduling - Scheduling Inquiry for Clinic >> Aug 16, 2017  9:29 AM Ether Griffins B wrote: Reason for CRM: pt needing ER follow up from 08/15/17

## 2017-08-16 NOTE — ED Provider Notes (Signed)
[facility] Emergency Department Provider Note   ____________________________________________   First MD Initiated Contact with Patient 08/15/17 2352     (approximate)  I have reviewed the triage vital signs and the nursing notes.   HISTORY  Chief Complaint Altered Mental Status    HPI Daniel Kline is a 72 y.o. male comes into the hospital today not feeling well.  He reports that he had a lot of pain in his hips and knees and he has been feeling really rough today.  He states that he took a hydrocodone for pain but it did not help.  The patient states that he felt nauseous and he was just hurting.  He had a fever at home according to his wife it was 103 degrees.  They called the rescue and did not give him anything for fever.  He denies any cough or runny nose.  He had no pain with urination and no vomiting.  The patient has a mild headache.  The patient's wife reports that he was confused and saying things that did not make sense which is why they brought him in for evaluation.   Past Medical History:  Diagnosis Date  . Arthritis   . Diabetes mellitus without complication ([place])   . HTN (hypertension)   . MRSA (methicillin resistant Staphylococcus aureus)   . Polio     Patient Active Problem List   Diagnosis Date Noted  . Long-term use of high-risk medication 08/07/2017  . Suspected sleep apnea 08/07/2017  . Hospital discharge follow-up 05/07/2017  . Asymptomatic gallstones 01/06/2016  . 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 Atrial flutter ([place]) 10/29/2015  . Bee sting-induced anaphylaxis 10/29/2015  . Venous stasis dermatitis of both lower extremities 10/29/2015  . Diabetes mellitus with nephropathy ([place]) 09/27/2015  . Obesity 09/27/2015  . Degenerative arthritis of hip 08/26/2015  . Arthritis of knee, degenerative 08/26/2015  . 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 Hypercholesterolemia 12/20/2011  . Infection and  inflammatory reaction due to internal prosthetic device, implant, and graft 12/20/2011  . Infection with methicillin-resistant Staphylococcus aureus 12/20/2011  . POLIOMYELITIS 01/12/2010  . Essential hypertension 01/12/2010    Past Surgical History:  Procedure Laterality Date  . right elbow surgery    . right knee surgery    . right shoulder surgery    . spleenectomy    . TOTAL HIP ARTHROPLASTY  04/2016    Prior to Admission medications   Medication Sig Start Date End Date Taking? Authorizing Provider  albuterol (PROVENTIL HFA) 108 (90 Base) MCG/ACT inhaler Inhale 2 puffs into the lungs every 6 (six) hours as needed for wheezing or shortness of breath. Patient not taking: Reported on 08/05/2017 11/10/16   Burnard Hawthorne, FNP  amLODipine (NORVASC) 10 MG tablet TAKE ONE TABLET EVERY DAY 06/07/17   Crecencio Mc, MD  apixaban (ELIQUIS) 5 MG TABS tablet Take 5 mg by mouth. 03/21/17 06/19/17  [provider]  carvedilol (COREG) 3.125 MG tablet TAKE ONE TABLET BY MOUTH TWICE DAILY WITH A MEAL 05/02/17   Crecencio Mc, MD  chlorpheniramine-HYDROcodone (TUSSIONEX PENNKINETIC ER) 10-8 MG/5ML SUER Take 5 mLs by mouth at bedtime as needed for cough. Patient not taking: Reported on 08/05/2017 11/03/16   Burnard Hawthorne, FNP  Cholecalciferol (VITAMIN D-3) 1000 units CAPS Take 2 capsules by mouth daily. Reported on 02/27/2016    [provider]  clobetasol cream (TEMOVATE) 3.47 % Apply 1 application topically 2 (two) times  daily.  06/27/15   [provider]  clorazepate (TRANXENE) 7.5 MG tablet TAKE 1 TABLET BY MOUTH 3 TIMES DAILY AS NEEDED. NEED TO CALL & SCHEDULE OFFICE VISIT. 06/08/17   Crecencio Mc, MD  EPINEPHrine (EPIPEN 2-PAK) 0.3 mg/0.3 mL IJ SOAJ injection Inject 0.3 mLs (0.3 mg total) into the muscle once. 10/28/15   Crecencio Mc, MD  famotidine (PEPCID) 20 MG tablet TAKE 1 TABLET BY MOUTH TWICE DAILY 10/28/16   Crecencio Mc, MD  furosemide (LASIX) 20  MG tablet Take 1 tablet (20 mg total) by mouth every morning. 08/12/17   Crecencio Mc, MD  gabapentin (NEURONTIN) 300 MG capsule TAKE 1 CAPSULE BY MOUTH 3 TIMES DAILY 07/22/17   Crecencio Mc, MD  glipiZIDE (GLUCOTROL) 5 MG tablet Take 0.5 tablets (2.5 mg total) by mouth 2 (two) times daily before a meal. 08/07/17   Crecencio Mc, MD  hydrALAZINE (APRESOLINE) 25 MG tablet Take 1 tablet (25 mg total) by mouth 3 (three) times daily. 11/19/16   Crecencio Mc, MD  hydrALAZINE (APRESOLINE) 50 MG tablet TAKE ONE TABLET 3 TIMES DAILY 08/01/17   Crecencio Mc, MD  HYDROcodone-acetaminophen (NORCO/VICODIN) 5-325 MG tablet Take 1 tablet by mouth 2 (two) times daily as needed for moderate pain. 08/05/17   Crecencio Mc, MD  HYDROcodone-acetaminophen (NORCO/VICODIN) 5-325 MG tablet Take 1 tablet by mouth 2 (two) times daily as needed for moderate pain. 08/05/17   Crecencio Mc, MD  hydrOXYzine (ATARAX/VISTARIL) 25 MG tablet Take 1 tablet (25 mg total) by mouth every 8 (eight) hours as needed. 01/28/17   Crecencio Mc, MD  INTRON A 6000000 UNIT/ML injection  02/04/16   [provider]  JARDIANCE 10 MG TABS tablet TAKE ONE TABLET EVERY DAY 04/18/17   Crecencio Mc, MD  latanoprost (XALATAN) 0.005 % ophthalmic solution Place 1 drop into the right eye at bedtime.    [provider]  losartan (COZAAR) 100 MG tablet TAKE ONE TABLET EVERY DAY 02/28/17   Crecencio Mc, MD  metFORMIN (GLUCOPHAGE) 500 MG tablet TAKE 1 TABLET BY MOUTH TWICE DAILY 08/01/17   Crecencio Mc, MD  montelukast (SINGULAIR) 10 MG tablet TAKE ONE TABLET BY MOUTH EVERY EVENING AT BEDTIME 03/07/17   Crecencio Mc, MD  Multiple Vitamins-Minerals (MULTIVITAL) tablet Take 1 tablet by mouth daily.      [provider]  ONE TOUCH ULTRA TEST test strip TEST once daily or as directed 08/03/16   Crecencio Mc, MD  potassium chloride SA (K-DUR,KLOR-CON) 20 MEQ tablet TAKE ONE TABLET EVERY DAY 03/22/17   Crecencio Mc, MD  rosuvastatin (CRESTOR) 20 MG tablet Take 1 tablet (20 mg total) by mouth daily. 08/07/17   Crecencio Mc, MD  sitaGLIPtin (JANUVIA) 100 MG tablet Take 1 tablet (100 mg total) by mouth daily. 05/06/17   Crecencio Mc, MD  traMADol (ULTRAM) 50 MG tablet TAKE ONE TABLET EVERY EIGHT HOURS AS NEEDED 06/27/17   Crecencio Mc, MD    Allergies Oxycodone; Hydromorphone; and Zolpidem  Family History  Adopted: Yes    Social History Social History   Tobacco Use  . Smoking status: Former Smoker    Last attempt to quit: 09/23/1990    Years since quitting: 26.9  . Smokeless tobacco: Former Systems developer    Quit date: 02/25/2006  . Tobacco comment: tobacco use- no   Substance Use Topics  . Alcohol use: No    Alcohol/week:  0.0 oz  . Drug use: No    Review of Systems  Constitutional:  fever/chills Eyes: No visual changes. ENT: No sore throat. Cardiovascular: Denies chest pain. Respiratory: Denies shortness of breath. Gastrointestinal: No abdominal pain.  No nausea, no vomiting.  No diarrhea.  No constipation. Genitourinary: Negative for dysuria. Musculoskeletal: Arthralgias and myalgias Skin: Negative for rash. Neurological: Confusion and headache   ____________________________________________   PHYSICAL EXAM:  VITAL SIGNS: ED Triage Vitals  Enc Vitals Group     BP 08/15/17 2254 (!) 180/89     Pulse Rate 08/15/17 2252 (!) 101     Resp 08/15/17 2252 (!) 22     Temp 08/15/17 2252 (!) 102.6 F (39.2 C)     Temp Source 08/15/17 2252 Oral     SpO2 08/15/17 2252 94 %     Weight 08/15/17 2253 250 lb (113.4 kg)     Height 08/15/17 2253 5\' 2"  (1.575 m)     Head Circumference --      Peak Flow --      Pain Score --      Pain Loc --      Pain Edu? --      Excl. in [place]? --     Constitutional: Alert and oriented. Well appearing and in moderate distress. Eyes: Conjunctivae are normal. PERRL. EOMI. Head: Atraumatic. Nose: No congestion/rhinnorhea. Mouth/Throat: Mucous  membranes are moist.  Oropharynx non-erythematous. Cardiovascular: tachycardia, regular rhythm. Grossly normal heart sounds.  Good peripheral circulation. Respiratory: Normal respiratory effort.  No retractions. Lungs CTAB. Gastrointestinal: Soft and nontender. No distention. No abdominal bruits. No CVA tenderness. Musculoskeletal: No lower extremity tenderness nor edema.   Neurologic:  Normal speech and language.  Cranial nerves II through XII are grossly intact with no focal motor neuro deficit Skin:  Skin is warm, dry and intact. No rash noted. Psychiatric: Mood and affect are normal. Speech and behavior are normal.  ____________________________________________   LABS (all labs ordered are listed, but only abnormal results are displayed)  Labs Reviewed  COMPREHENSIVE METABOLIC PANEL - Abnormal; Notable for the following components:      Result Value   Glucose, Bld 192 (*)    BUN 26 (*)    Creatinine, Ser 1.75 (*)    GFR calc non Af Amer 37 (*)    GFR calc Af Amer 43 (*)    All other components within normal limits  CBC WITH DIFFERENTIAL/PLATELET - Abnormal; Notable for the following components:   WBC 15.7 (*)    RDW 14.9 (*)    Neutro Abs 14.3 (*)    Lymphs Abs 0.4 (*)    All other components within normal limits  URINALYSIS, COMPLETE (UACMP) WITH MICROSCOPIC - Abnormal; Notable for the following components:   Color, Urine YELLOW (*)    APPearance CLEAR (*)    Hgb urine dipstick SMALL (*)    Protein, ur 100 (*)    All other components within normal limits  CULTURE, BLOOD (ROUTINE X 2)  CULTURE, BLOOD (ROUTINE X 2)  LACTIC ACID, PLASMA  PROTIME-INR  INFLUENZA PANEL BY PCR (TYPE A & B)  LACTIC ACID, PLASMA   ____________________________________________  EKG  ED ECG REPORT I, Loney Hering, the attending physician, personally viewed and interpreted this ECG.   Date: 08/15/2017  EKG Time: 2310  Rate: 98  Rhythm: normal sinus rhythm  Axis: normal   Intervals:none  ST&T Change: none   ED ECG REPORT I, Loney Hering, the attending physician,  personally viewed and interpreted this ECG.   Date: 08/15/2017  EKG Time: 2314  Rate: 100  Rhythm: normal sinus rhythm  Axis: normal  Intervals:none  ST&T Change: none  ____________________________________________  RADIOLOGY  Dg Chest Port 1 View  Result Date: 08/15/2017 CLINICAL DATA:  Sudden onset of fever and confusion. EXAM: PORTABLE CHEST 1 VIEW COMPARISON:  04/14/2011 FINDINGS: Stable cardiomegaly with aortic atherosclerosis. No pneumonic consolidation or overt pulmonary edema. Chronic stable deformity of the right humeral head and shaft with single screw fixation noted. Marked osteoarthritic joint space narrowing spurring of the left glenohumeral joint. IMPRESSION: 1. Stable cardiomegaly.  No active pulmonary disease. 2. Osteoarthritis of the left glenohumeral joint. 3. Chronic posttraumatic deformity single screw fixation of the proximal right humerus. Electronically Signed   By: Ashley Royalty M.D.   On: 08/15/2017 23:35    ____________________________________________   PROCEDURES  Procedure(s) performed: None  Procedures  Critical Care performed: No  ____________________________________________   INITIAL IMPRESSION / ASSESSMENT AND PLAN / ED COURSE  As part of my medical decision making, I reviewed the following data within the electronic MEDICAL RECORD NUMBER Notes from prior ED visits and [place] Controlled Substance Database   This is a 72 year old male who comes into the hospital today with a fever, confusion myalgias and arthralgias.  My differential diagnosis includes influenza, pneumonia, urinary tract infection  I did check some blood work and the patient does have a mild elevated white blood cell count of 15.7.  His CMP though is unremarkable.  His creatinine is 1.75 which is mildly elevated from normal.  Patient's urinalysis is negative and his chest x-ray is  negative.  The patient's lactic acid is also normal.  We gave the patient some ibuprofen and then some Tylenol to help bring down his temperature.  I went back into evaluate the patient he was feeling much better with his fever reduced.  I am unsure the cause of the patient's fever and illness but I will give him a dose of ceftriaxone.  The patient will be discharged home to follow-up with his primary care physician.      ____________________________________________   FINAL CLINICAL IMPRESSION(S) / ED DIAGNOSES  Final diagnoses:  Fever, unspecified fever cause  Body aches  Arthralgia, unspecified joint  Confusion     ED Discharge Orders    None       Note:  This document was prepared using Dragon voice recognition software and may include unintentional dictation errors.    Loney Hering, MD 08/16/17 [phone]

## 2017-08-17 MED ORDER — CEFUROXIME AXETIL 500 MG PO TABS: 500.0000 mg | ORAL_TABLET | Freq: Two times a day (BID) | ORAL | 0 refills | Status: DC

## 2017-08-17 NOTE — Telephone Encounter (Signed)
The bacteria was in one of his BLOOD cultures,  Not urine.  He needs to be on antibiotics  Just in case this is real and not a contaminant.  So  I have sent a prescription for  Cefuroxime 500 mg twice daily to his pharmacy.  Needs to start it tonight if he can

## 2017-08-17 NOTE — Telephone Encounter (Signed)
Spoke with pt's wife, on [place], and she stated that the pt was seen at the ED and diagnosed with bacteria in his urine. She stated that they gave him IV antibiotics while in the ED but did not send him home with medications just told him that he needed to follow up with his PCP. You do not have anything for the rest of this week do you think it would be okay to schedule the pt in one of the 11:30 or 4:30 slots for next week.

## 2017-08-17 NOTE — Telephone Encounter (Signed)
Patient is aware and has been feeling better. Still weak and not much appetite. Scheduled patient for 1/17 @ 11:00.

## 2017-08-17 NOTE — Telephone Encounter (Signed)
Pt wife calling stating that stating that her husband need an f/u from hospital stating that he has bacteria in his urine

## 2017-08-18 LAB — CULTURE, BLOOD (ROUTINE X 2)

## 2017-08-19 ENCOUNTER — Emergency Department
Admission: EM | Admit: 2017-08-19 | Discharge: 2017-08-19 | Disposition: A | Discharge status: Home / Self Care | Payer: Medicare Other | Attending: Student in an Organized Health Care Education/Training Program | Admitting: Student in an Organized Health Care Education/Training Program

## 2017-08-19 ENCOUNTER — Encounter: Payer: Self-pay | Admitting: Emergency Medicine

## 2017-08-19 ENCOUNTER — Emergency Department: Payer: Medicare Other

## 2017-08-19 ENCOUNTER — Other Ambulatory Visit: Payer: Self-pay

## 2017-08-19 ENCOUNTER — Ambulatory Visit: Payer: Self-pay | Admitting: *Deleted

## 2017-08-19 DIAGNOSIS — Z7984 Long term (current) use of oral hypoglycemic drugs: Secondary | ICD-10-CM | POA: Diagnosis not present

## 2017-08-19 DIAGNOSIS — Z79899 Other long term (current) drug therapy: Secondary | ICD-10-CM | POA: Insufficient documentation

## 2017-08-19 DIAGNOSIS — M7989 Other specified soft tissue disorders: Secondary | ICD-10-CM | POA: Diagnosis not present

## 2017-08-19 DIAGNOSIS — R600 Localized edema: Secondary | ICD-10-CM | POA: Diagnosis not present

## 2017-08-19 DIAGNOSIS — Z87891 Personal history of nicotine dependence: Secondary | ICD-10-CM | POA: Diagnosis not present

## 2017-08-19 DIAGNOSIS — I129 Hypertensive chronic kidney disease with stage 1 through stage 4 chronic kidney disease, or unspecified chronic kidney disease: Secondary | ICD-10-CM | POA: Diagnosis not present

## 2017-08-19 DIAGNOSIS — E1140 Type 2 diabetes mellitus with diabetic neuropathy, unspecified: Secondary | ICD-10-CM | POA: Diagnosis not present

## 2017-08-19 DIAGNOSIS — M79604 Pain in right leg: Secondary | ICD-10-CM

## 2017-08-19 DIAGNOSIS — Z8572 Personal history of non-Hodgkin lymphomas: Secondary | ICD-10-CM | POA: Insufficient documentation

## 2017-08-19 DIAGNOSIS — E119 Type 2 diabetes mellitus without complications: Secondary | ICD-10-CM | POA: Insufficient documentation

## 2017-08-19 DIAGNOSIS — Z96649 Presence of unspecified artificial hip joint: Secondary | ICD-10-CM | POA: Diagnosis not present

## 2017-08-19 DIAGNOSIS — N182 Chronic kidney disease, stage 2 (mild): Secondary | ICD-10-CM | POA: Diagnosis not present

## 2017-08-19 DIAGNOSIS — R2241 Localized swelling, mass and lump, right lower limb: Secondary | ICD-10-CM | POA: Diagnosis not present

## 2017-08-19 DIAGNOSIS — Z7901 Long term (current) use of anticoagulants: Secondary | ICD-10-CM | POA: Diagnosis not present

## 2017-08-19 LAB — COMPREHENSIVE METABOLIC PANEL
ALK PHOS: 64 U/L (ref 38–126)
ALT: 48 U/L (ref 17–63)
AST: 38 U/L (ref 15–41)
Albumin: 3.4 g/dL — ABNORMAL LOW (ref 3.5–5.0)
Anion gap: 7 (ref 5–15)
BILIRUBIN TOTAL: 0.7 mg/dL (ref 0.3–1.2)
BUN: 17 mg/dL (ref 6–20)
CALCIUM: 8.6 mg/dL — AB (ref 8.9–10.3)
CO2: 25 mmol/L (ref 22–32)
CREATININE: 1.56 mg/dL — AB (ref 0.61–1.24)
Chloride: 105 mmol/L (ref 101–111)
GFR calc non Af Amer: 43 mL/min — ABNORMAL LOW (ref >60)
GFR, EST AFRICAN AMERICAN: 49 mL/min — AB (ref >60)
GLUCOSE: 89 mg/dL (ref 65–99)
Potassium: 4.2 mmol/L (ref 3.5–5.1)
SODIUM: 137 mmol/L (ref 135–145)
TOTAL PROTEIN: 6.6 g/dL (ref 6.5–8.1)

## 2017-08-19 LAB — CBC WITH DIFFERENTIAL/PLATELET
Basophils Absolute: 0.1 10*3/uL (ref 0–0.1)
Basophils Relative: 1 %
Eosinophils Absolute: 0.1 10*3/uL (ref 0–0.7)
Eosinophils Relative: 1 %
HEMATOCRIT: 38.6 % — AB (ref 40.0–52.0)
HEMOGLOBIN: 12.8 g/dL — AB (ref 13.0–18.0)
LYMPHS ABS: 1.5 10*3/uL (ref 1.0–3.6)
LYMPHS PCT: 23 %
MCH: 28.9 pg (ref 26.0–34.0)
MCHC: 33.2 g/dL (ref 32.0–36.0)
MCV: 86.9 fL (ref 80.0–100.0)
MONOS PCT: 11 %
Monocytes Absolute: 0.7 10*3/uL (ref 0.2–1.0)
NEUTROS ABS: 4.2 10*3/uL (ref 1.4–6.5)
NEUTROS PCT: 64 %
Platelets: 187 10*3/uL (ref 150–440)
RBC: 4.44 MIL/uL (ref 4.40–5.90)
RDW: 15.2 % — ABNORMAL HIGH (ref 11.5–14.5)
WBC: 6.6 10*3/uL (ref 3.8–10.6)

## 2017-08-19 MED ORDER — MEDICAL COMPRESSION STOCKINGS MISC: 2.0000 [IU] | Freq: Every day | 0 refills | Status: DC

## 2017-08-19 NOTE — Discharge Instructions (Signed)
Follow up with Dr. Derrel Nip.  Return to the ER if symptoms worsening at any point.  Continue taking Antibiotics as prescribed.  Keep leg elevated when at home or sitting in chair

## 2017-08-19 NOTE — Telephone Encounter (Signed)
Patient  Going to ED confirmed . FYI

## 2017-08-19 NOTE — ED Notes (Signed)
Dr Robinson at bedside 

## 2017-08-19 NOTE — ED Provider Notes (Signed)
[facility] Emergency Department Provider Note    First MD Initiated Contact with Patient 08/19/17 1855     (approximate)  I have reviewed the triage vital signs and the nursing notes.   HISTORY  Chief Complaint Leg Swelling    HPI Daniel Kline is a 72 y.o. male very pleasant presents with chief complaint of right lower extremity swelling and redness with burning pain.  States that he noticed this yesterday.  Patient was just in the ER on the eighth and was recently started on cefuroxime for 1 of his blood cultures growing gram-positive cocci.  States he is tolerated antibiotics and is otherwise feeling well.  Is not any blood thinners currently.  Denies any shortness of breath.  No fevers at home.  Has had chronic swelling in his legs.  No nausea or vomiting.  Past Medical History:  Diagnosis Date  . Arthritis   . Diabetes mellitus without complication ([place])   . HTN (hypertension)   . MRSA (methicillin resistant Staphylococcus aureus)   . Polio    Family History  Adopted: Yes   Past Surgical History:  Procedure Laterality Date  . right elbow surgery    . right knee surgery    . right shoulder surgery    . spleenectomy    . TOTAL HIP ARTHROPLASTY  04/2016   Patient Active Problem List   Diagnosis Date Noted  . Long-term use of high-risk medication 08/07/2017  . Suspected sleep apnea 08/07/2017  . Hospital discharge follow-up 05/07/2017  . Asymptomatic gallstones 01/06/2016  . 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 Atrial flutter ([place]) 10/29/2015  . Bee sting-induced anaphylaxis 10/29/2015  . Venous stasis dermatitis of both lower extremities 10/29/2015  . Diabetes mellitus with nephropathy ([place]) 09/27/2015  . Obesity 09/27/2015  . Degenerative arthritis of hip 08/26/2015  . Arthritis of knee, degenerative 08/26/2015  . 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 Hypercholesterolemia 12/20/2011  . Infection and  inflammatory reaction due to internal prosthetic device, implant, and graft 12/20/2011  . Infection with methicillin-resistant Staphylococcus aureus 12/20/2011  . POLIOMYELITIS 01/12/2010  . Essential hypertension 01/12/2010      Prior to Admission medications   Medication Sig Start Date End Date Taking? Authorizing Provider  albuterol (PROVENTIL HFA) 108 (90 Base) MCG/ACT inhaler Inhale 2 puffs into the lungs every 6 (six) hours as needed for wheezing or shortness of breath. Patient not taking: Reported on 08/05/2017 11/10/16   Burnard Hawthorne, FNP  amLODipine (NORVASC) 10 MG tablet TAKE ONE TABLET EVERY DAY 06/07/17   Crecencio Mc, MD  apixaban (ELIQUIS) 5 MG TABS tablet Take 5 mg by mouth. 03/21/17 06/19/17  [provider]  carvedilol (COREG) 3.125 MG tablet TAKE ONE TABLET BY MOUTH TWICE DAILY WITH A MEAL 05/02/17   Crecencio Mc, MD  cefUROXime (CEFTIN) 500 MG tablet Take 1 tablet (500 mg total) by mouth 2 (two) times daily with a meal. 08/17/17   Crecencio Mc, MD  chlorpheniramine-HYDROcodone (TUSSIONEX PENNKINETIC ER) 10-8 MG/5ML SUER Take 5 mLs by mouth at bedtime as needed for cough. Patient not taking: Reported on 08/05/2017 11/03/16   Burnard Hawthorne, FNP  Cholecalciferol (VITAMIN D-3) 1000 units CAPS Take 2 capsules by mouth daily. Reported on 02/27/2016    [provider]  clobetasol cream (TEMOVATE) 4.53 % Apply 1 application topically 2 (two) times daily.  06/27/15   [provider]  clorazepate (TRANXENE) 7.5 MG tablet TAKE 1 TABLET  BY MOUTH 3 TIMES DAILY AS NEEDED. NEED TO CALL & SCHEDULE OFFICE VISIT. 06/08/17   Crecencio Mc, MD  Elastic Bandages & Supports (MEDICAL COMPRESSION STOCKINGS) MISC 2 Units by Does not apply route daily. 08/19/17   Merlyn Lot, MD  EPINEPHrine (EPIPEN 2-PAK) 0.3 mg/0.3 mL IJ SOAJ injection Inject 0.3 mLs (0.3 mg total) into the muscle once. 10/28/15   Crecencio Mc, MD  famotidine (PEPCID) 20 MG tablet  TAKE 1 TABLET BY MOUTH TWICE DAILY 10/28/16   Crecencio Mc, MD  furosemide (LASIX) 20 MG tablet Take 1 tablet (20 mg total) by mouth every morning. 08/12/17   Crecencio Mc, MD  gabapentin (NEURONTIN) 300 MG capsule TAKE 1 CAPSULE BY MOUTH 3 TIMES DAILY 07/22/17   Crecencio Mc, MD  glipiZIDE (GLUCOTROL) 5 MG tablet Take 0.5 tablets (2.5 mg total) by mouth 2 (two) times daily before a meal. 08/07/17   Crecencio Mc, MD  hydrALAZINE (APRESOLINE) 25 MG tablet Take 1 tablet (25 mg total) by mouth 3 (three) times daily. 11/19/16   Crecencio Mc, MD  hydrALAZINE (APRESOLINE) 50 MG tablet TAKE ONE TABLET 3 TIMES DAILY 08/01/17   Crecencio Mc, MD  HYDROcodone-acetaminophen (NORCO/VICODIN) 5-325 MG tablet Take 1 tablet by mouth 2 (two) times daily as needed for moderate pain. 08/05/17   Crecencio Mc, MD  HYDROcodone-acetaminophen (NORCO/VICODIN) 5-325 MG tablet Take 1 tablet by mouth 2 (two) times daily as needed for moderate pain. 08/05/17   Crecencio Mc, MD  hydrOXYzine (ATARAX/VISTARIL) 25 MG tablet Take 1 tablet (25 mg total) by mouth every 8 (eight) hours as needed. 01/28/17   Crecencio Mc, MD  INTRON A 6000000 UNIT/ML injection  02/04/16   [provider]  JARDIANCE 10 MG TABS tablet TAKE ONE TABLET EVERY DAY 04/18/17   Crecencio Mc, MD  latanoprost (XALATAN) 0.005 % ophthalmic solution Place 1 drop into the right eye at bedtime.    [provider]  losartan (COZAAR) 100 MG tablet TAKE ONE TABLET EVERY DAY 02/28/17   Crecencio Mc, MD  metFORMIN (GLUCOPHAGE) 500 MG tablet TAKE 1 TABLET BY MOUTH TWICE DAILY 08/01/17   Crecencio Mc, MD  montelukast (SINGULAIR) 10 MG tablet TAKE ONE TABLET BY MOUTH EVERY EVENING AT BEDTIME 03/07/17   Crecencio Mc, MD  Multiple Vitamins-Minerals (MULTIVITAL) tablet Take 1 tablet by mouth daily.      [provider]  ONE TOUCH ULTRA TEST test strip TEST once daily or as directed 08/03/16   Crecencio Mc, MD  potassium  chloride SA (K-DUR,KLOR-CON) 20 MEQ tablet TAKE ONE TABLET EVERY DAY 03/22/17   Crecencio Mc, MD  rosuvastatin (CRESTOR) 20 MG tablet Take 1 tablet (20 mg total) by mouth daily. 08/07/17   Crecencio Mc, MD  sitaGLIPtin (JANUVIA) 100 MG tablet Take 1 tablet (100 mg total) by mouth daily. 05/06/17   Crecencio Mc, MD  traMADol (ULTRAM) 50 MG tablet TAKE ONE TABLET EVERY EIGHT HOURS AS NEEDED 06/27/17   Crecencio Mc, MD    Allergies Oxycodone; Hydromorphone; and Zolpidem    Social History Social History   Tobacco Use  . Smoking status: Former Smoker    Last attempt to quit: 09/23/1990    Years since quitting: 26.9  . Smokeless tobacco: Former Systems developer    Quit date: 02/25/2006  . Tobacco comment: tobacco use- no   Substance Use Topics  . Alcohol use: No    Alcohol/week:  0.0 oz  . Drug use: No    Review of Systems Patient denies headaches, rhinorrhea, blurry vision, numbness, shortness of breath, chest pain, edema, cough, abdominal pain, nausea, vomiting, diarrhea, dysuria, fevers, rashes or hallucinations unless otherwise stated above in HPI. ____________________________________________   PHYSICAL EXAM:  VITAL SIGNS: Vitals:   08/19/17 1900 08/19/17 1930  BP: 125/76 126/79  Pulse: (!) 58 62  Resp:    Temp:    SpO2: 96% 97%    Constitutional: Alert and oriented. Well appearing and in no acute distress. Eyes: Conjunctivae are normal.  Head: Atraumatic. Nose: No congestion/rhinnorhea. Mouth/Throat: Mucous membranes are moist.   Neck: No stridor. Painless ROM.  Cardiovascular: Normal rate, regular rhythm. Grossly normal heart sounds.  Good peripheral circulation. Respiratory: Normal respiratory effort.  No retractions. Lungs CTAB. Gastrointestinal: Soft and nontender. No distention. No abdominal bruits. No CVA tenderness. Genitourinary:  Musculoskeletal: Does have redness and swelling to the right lower extremity with 2+ pitting edema right greater than left.  No  crepitus.  No significant warmth as compared to the left.  No blistering.  Brisk cap refill distally neurovascularly intact distally.  No joint effusions. Neurologic:  Normal speech and language. No gross focal neurologic deficits are appreciated. No facial droop Skin:  Skin is warm, dry and intact. No rash noted. Psychiatric: Mood and affect are normal. Speech and behavior are normal.  ____________________________________________   LABS (all labs ordered are listed, but only abnormal results are displayed)  Results for orders placed or performed during the hospital encounter of 08/19/17 (from the past 24 hour(s))  CBC with Differential/Platelet     Status: Abnormal   Collection Time: 08/19/17  6:55 PM  Result Value Ref Range   WBC 6.6 3.8 - 10.6 K/uL   RBC 4.44 4.40 - 5.90 MIL/uL   Hemoglobin 12.8 (L) 13.0 - 18.0 g/dL   HCT 38.6 (L) 40.0 - 52.0 %   MCV 86.9 80.0 - 100.0 fL   MCH 28.9 26.0 - 34.0 pg   MCHC 33.2 32.0 - 36.0 g/dL   RDW 15.2 (H) 11.5 - 14.5 %   Platelets 187 150 - 440 K/uL   Neutrophils Relative % 64 %   Neutro Abs 4.2 1.4 - 6.5 K/uL   Lymphocytes Relative 23 %   Lymphs Abs 1.5 1.0 - 3.6 K/uL   Monocytes Relative 11 %   Monocytes Absolute 0.7 0.2 - 1.0 K/uL   Eosinophils Relative 1 %   Eosinophils Absolute 0.1 0 - 0.7 K/uL   Basophils Relative 1 %   Basophils Absolute 0.1 0 - 0.1 K/uL  Comprehensive metabolic panel     Status: Abnormal   Collection Time: 08/19/17  6:55 PM  Result Value Ref Range   Sodium 137 135 - 145 mmol/L   Potassium 4.2 3.5 - 5.1 mmol/L   Chloride 105 101 - 111 mmol/L   CO2 25 22 - 32 mmol/L   Glucose, Bld 89 65 - 99 mg/dL   BUN 17 6 - 20 mg/dL   Creatinine, Ser 1.56 (H) 0.61 - 1.24 mg/dL   Calcium 8.6 (L) 8.9 - 10.3 mg/dL   Total Protein 6.6 6.5 - 8.1 g/dL   Albumin 3.4 (L) 3.5 - 5.0 g/dL   AST 38 15 - 41 U/L   ALT 48 17 - 63 U/L   Alkaline Phosphatase 64 38 - 126 U/L   Total Bilirubin 0.7 0.3 - 1.2 mg/dL   GFR calc non Af Amer 43  (L) >60 mL/min  GFR calc Af Amer 49 (L) >60 mL/min   Anion gap 7 5 - 15   __________________________________________________________________________________  RADIOLOGY  I personally reviewed all radiographic images ordered to evaluate for the above acute complaints and reviewed radiology reports and findings.  These findings were personally discussed with the patient.  Please see medical record for radiology report.  ____________________________________________   PROCEDURES  Procedure(s) performed:  Procedures    Critical Care performed: no ____________________________________________   INITIAL IMPRESSION / ASSESSMENT AND PLAN / ED COURSE  Pertinent labs & imaging results that were available during my care of the patient were reviewed by me and considered in my medical decision making (see chart for details).  DDX: DVT, cellulitis, venous stasis, lymphedema  Daniel Kline is a 72 y.o. who presents to the ED with symptoms as described above.  No evidence of DVT.  Blood work is reassuring.  He is afebrile.  Not clinically consistent with necrotizing soft tissue infection.  At this point I do not believe that antibiotics need to be changed based on the reassuring blood work and lack of fever.  More consistent with venous stasis and edema.  Patient will be given compression stockings for trial of outpatient observation.  Instructed to follow-up with PCP and return to the ER and 24-48 hours if symptoms worsen at any point.  Have discussed with the patient and available family all diagnostics and treatments performed thus far and all questions were answered to the best of my ability. The patient demonstrates understanding and agreement with plan.       ____________________________________________   FINAL CLINICAL IMPRESSION(S) / ED DIAGNOSES  Final diagnoses:  Leg edema  Pain of right lower extremity      NEW MEDICATIONS STARTED DURING THIS VISIT:  New Prescriptions     ELASTIC BANDAGES & SUPPORTS (MEDICAL COMPRESSION STOCKINGS) MISC    2 Units by Does not apply route daily.     Note:  This document was prepared using Dragon voice recognition software and may include unintentional dictation errors.    Merlyn Lot, MD 08/19/17 2010

## 2017-08-19 NOTE — ED Triage Notes (Signed)
Pt to ED via POV from MD office. Pt states that yesterday he noticed swelling in his RLE. Pt has redness and warmth also. Pt denies pain in the leg but states that it is sensitive to touch. Pt denies hx/o DVT. Pt is diabetic. Pt currently on antibiotics for infection in his blood. Pt states that he started them yesterday. Pt in NAD at this time.

## 2017-08-19 NOTE — ED Notes (Addendum)
Pt back to lobby from u/s, pt reading newspaper, no distress noted, cont to monitor

## 2017-08-19 NOTE — ED Notes (Signed)
Pt states redness and swelling x 2 days to anterior lower leg. Front of leg is warm to touch. No warm or redness noted to L leg or any other part on R leg. Pt states takes lasix. Denies SOB. Denies CP. Alert, oriented, no distress noted. Wife states pt was here earlier this week with fever and confusion. States Dr. Derrel Nip started pt on antibiotics, is still taking them.

## 2017-08-19 NOTE — Telephone Encounter (Signed)
Wife called in for him stating his right leg from the knee down to the foot is swollen and red.  It started yesterday and has gotten worse today. I instructed wife to take him to the ED for evaluation.   (He was just there on Monday evening and diagnosed with "bacteria in his blood" and given IV antibiotics per his wife).     His wife is going to take him to [facility] ED now. I routed a note to Dr. Derrel Nip so she would be aware of his situation too. Reason for Disposition . [1] Thigh, calf, or ankle swelling AND [2] only 1 side  Answer Assessment - Initial Assessment Questions 1. ONSET: "When did the swelling start?" (e.g., minutes, hours, days)     Right leg from knee down is swollen.   Started yesterday.   It's redder today than yesterday and very warm feeling.    2. LOCATION: "What part of the leg is swollen?"  "Are both legs swollen or just one leg?"     Right leg from knee down to the foot. 3. SEVERITY: "How bad is the swelling?" (e.g., localized; mild, moderate, severe)  - Localized - small area of swelling localized to one leg  - MILD pedal edema - swelling limited to foot and ankle, pitting edema < 1/4 inch (6 mm) deep, rest and elevation eliminate most or all swelling  - MODERATE edema - swelling of lower leg to knee, pitting edema > 1/4 inch (6 mm) deep, rest and elevation only partially reduce swelling  - SEVERE edema - swelling extends above knee, facial or hand swelling present      1/2 the size of his other leg.   The left leg is fine. 4. REDNESS: "Does the swelling look red or infected?"     Red from knee down.     He is diabetic.   No wounds or problems in the past with non healing wounds. 5. PAIN: "Is the swelling painful to touch?" If so, ask: "How painful is it?"   (Scale 1-10; mild, moderate or severe)     It doesn't hurt 6. FEVER: "Do you have a fever?" If so, ask: "What is it, how was it measured, and when did it start?"      No fever 7. CAUSE: "What do you think is causing the  leg swelling?"     He don't know.  He went to the hospital Monday night with fever 104.   He has been taking Tylenol since then.   He has bacteria in his blood.   He was started on an antibiotic.  Antibiotic started yesterday.  Dr. Derrel Nip is aware of the blood with bacteria. 8. MEDICAL HISTORY: "Do you have a history of heart failure, kidney disease, liver failure, or cancer?"     Been going to a kidney doctor but not on dialysis.  9. RECURRENT SYMPTOM: "Have you had leg swelling before?" If so, ask: "When was the last time?" "What happened that time?"     First time 10. OTHER SYMPTOMS: "Do you have any other symptoms?" (e.g., chest pain, difficulty breathing)       No 11. PREGNANCY: "Is there any chance you are pregnant?" "When was your last menstrual period?"       N/A  Protocols used: LEG SWELLING AND EDEMA-A-AH

## 2017-08-20 LAB — CULTURE, BLOOD (ROUTINE X 2): Culture: NO GROWTH

## 2017-08-24 DIAGNOSIS — L851 Acquired keratosis [keratoderma] palmaris et plantaris: Secondary | ICD-10-CM | POA: Diagnosis not present

## 2017-08-24 DIAGNOSIS — E1142 Type 2 diabetes mellitus with diabetic polyneuropathy: Secondary | ICD-10-CM | POA: Diagnosis not present

## 2017-08-24 DIAGNOSIS — B351 Tinea unguium: Secondary | ICD-10-CM | POA: Diagnosis not present

## 2017-08-25 ENCOUNTER — Ambulatory Visit (INDEPENDENT_AMBULATORY_CARE_PROVIDER_SITE_OTHER): Payer: Medicare Other | Admitting: Internal Medicine

## 2017-08-25 ENCOUNTER — Encounter: Payer: Self-pay | Admitting: Internal Medicine

## 2017-08-25 VITALS — BP 150/80 | HR 79 | Temp 97.8°F | Resp 16 | Ht 66.0 in | Wt 241.0 lb

## 2017-08-25 DIAGNOSIS — R7881 Bacteremia: Secondary | ICD-10-CM

## 2017-08-25 DIAGNOSIS — Z8612 Personal history of poliomyelitis: Secondary | ICD-10-CM

## 2017-08-25 DIAGNOSIS — I872 Venous insufficiency (chronic) (peripheral): Secondary | ICD-10-CM

## 2017-08-25 DIAGNOSIS — B955 Unspecified streptococcus as the cause of diseases classified elsewhere: Secondary | ICD-10-CM | POA: Diagnosis not present

## 2017-08-25 DIAGNOSIS — N179 Acute kidney failure, unspecified: Secondary | ICD-10-CM | POA: Diagnosis not present

## 2017-08-25 DIAGNOSIS — N178 Other acute kidney failure: Secondary | ICD-10-CM

## 2017-08-25 DIAGNOSIS — R29818 Other symptoms and signs involving the nervous system: Secondary | ICD-10-CM

## 2017-08-25 LAB — BASIC METABOLIC PANEL
BUN: 22 mg/dL (ref 6–23)
CALCIUM: 8.5 mg/dL (ref 8.4–10.5)
CO2: 33 mEq/L — ABNORMAL HIGH (ref 19–32)
Chloride: 99 mEq/L (ref 96–112)
Creatinine, Ser: 1.45 mg/dL (ref 0.40–1.50)
GFR: 50.71 mL/min — AB (ref >60.00)
Glucose, Bld: 162 mg/dL — ABNORMAL HIGH (ref 70–99)
Potassium: 4.7 mEq/L (ref 3.5–5.1)
SODIUM: 138 meq/L (ref 135–145)

## 2017-08-25 MED ORDER — ALPRAZOLAM 0.5 MG PO TABS: 0.2500 mg | ORAL_TABLET | Freq: Every evening | ORAL | 0 refills | Status: DC | PRN

## 2017-08-25 NOTE — Progress Notes (Signed)
Subjective:  Patient ID: Daniel Kline, male    DOB: 08/10/1945  Age: 72 y.o. MRN: [id number]  CC: The primary encounter diagnosis was Acute renal failure with other specified pathological lesion in kidney [facility]). Diagnoses of History of poliomyelitis, Bacteremia due to Streptococcus, Acute renal failure, unspecified acute renal failure type ([place]), Suspected sleep apnea, and Venous stasis dermatitis of both lower extremities were also pertinent to this visit.  HPI Daniel Kline presents for ER follow up, patient was treated in ED x 2 occasions.    1) Jan 8th : presented to ED with one day history of  fever to 103, nausea, rigors  and delirium. Chest x ray and urine were negative for  Signs of infection .  Leulocytosis noted,  WBC 16K with left shift .  Blood cultures were drawn and he was dicharged from ER home.    One of four RESULTED POSITIVE  FOR streptococcus dysgalactiae,  Preliminary culture  was forwarded to me for action. I prescribed empirici  cefuroxime 500 mg bid. . Cultures eventually confirmed cusceptibility.  Patient completed a week of cefuroxime without trouble,  reports he has been feeling better daily and has not had a fever or rigors since treatment started .  No diarrhea either.  Not taking a probiotic.    2) Jan 11 : returned to ED with right lower leg redness and swelling.  WBC had normalized. US done , no DVT .  Diagnosed with Venous stasis and edema . Was still on ceforixme at time so cellultiis deemed unlikely.  Compression stockings advised  But not purchased yet.  Patient has highly suspected and untreated OSA   Awaiting sleep study appointment.    3) acute renal failure : review of ER labs note drop in GFR to 40 ml/moin on Jan 8, slight improvement on repeat Jan 11 .   Outpatient Medications Prior to Visit  Medication Sig Dispense Refill  . amLODipine (NORVASC) 10 MG tablet TAKE ONE TABLET EVERY DAY 90 tablet 1  . carvedilol (COREG) 3.125 MG tablet TAKE ONE  TABLET BY MOUTH TWICE DAILY WITH A MEAL 60 tablet 3  . chlorpheniramine-HYDROcodone (TUSSIONEX PENNKINETIC ER) 10-8 MG/5ML SUER Take 5 mLs by mouth at bedtime as needed for cough. 70 mL 0  . Cholecalciferol (VITAMIN D-3) 1000 units CAPS Take 2 capsules by mouth daily. Reported on 02/27/2016    . clobetasol cream (TEMOVATE) 8.54 % Apply 1 application topically 2 (two) times daily.     . clorazepate (TRANXENE) 7.5 MG tablet TAKE 1 TABLET BY MOUTH 3 TIMES DAILY AS NEEDED. NEED TO CALL & SCHEDULE OFFICE VISIT. 90 tablet 1  . Elastic Bandages & Supports (MEDICAL COMPRESSION STOCKINGS) MISC 2 Units by Does not apply route daily. 2 each 0  . EPINEPHrine (EPIPEN 2-PAK) 0.3 mg/0.3 mL IJ SOAJ injection Inject 0.3 mLs (0.3 mg total) into the muscle once. 1 Device 1  . famotidine (PEPCID) 20 MG tablet TAKE 1 TABLET BY MOUTH TWICE DAILY 60 tablet 5  . furosemide (LASIX) 20 MG tablet Take 1 tablet (20 mg total) by mouth every morning. 30 tablet 3  . gabapentin (NEURONTIN) 300 MG capsule TAKE 1 CAPSULE BY MOUTH 3 TIMES DAILY 90 capsule 2  . glipiZIDE (GLUCOTROL) 5 MG tablet Take 0.5 tablets (2.5 mg total) by mouth 2 (two) times daily before a meal. 60 tablet 3  . hydrALAZINE (APRESOLINE) 25 MG tablet Take 1 tablet (25 mg total) by mouth 3 (three) times daily. [place]  tablet 1  . hydrALAZINE (APRESOLINE) 50 MG tablet TAKE ONE TABLET 3 TIMES DAILY 90 tablet 1  . HYDROcodone-acetaminophen (NORCO/VICODIN) 5-325 MG tablet Take 1 tablet by mouth 2 (two) times daily as needed for moderate pain. 60 tablet 0  . HYDROcodone-acetaminophen (NORCO/VICODIN) 5-325 MG tablet Take 1 tablet by mouth 2 (two) times daily as needed for moderate pain. 60 tablet 0  . hydrOXYzine (ATARAX/VISTARIL) 25 MG tablet Take 1 tablet (25 mg total) by mouth every 8 (eight) hours as needed. 90 tablet 3  . INTRON A 6000000 UNIT/ML injection     . JARDIANCE 10 MG TABS tablet TAKE ONE TABLET EVERY DAY 90 tablet 2  . latanoprost (XALATAN) 0.005 % ophthalmic  solution Place 1 drop into the right eye at bedtime.    Marland Kitchen losartan (COZAAR) 100 MG tablet TAKE ONE TABLET EVERY DAY 90 tablet 1  . metFORMIN (GLUCOPHAGE) 500 MG tablet TAKE 1 TABLET BY MOUTH TWICE DAILY 60 tablet 1  . montelukast (SINGULAIR) 10 MG tablet TAKE ONE TABLET BY MOUTH EVERY EVENING AT BEDTIME 30 tablet 3  . Multiple Vitamins-Minerals (MULTIVITAL) tablet Take 1 tablet by mouth daily.      . ONE TOUCH ULTRA TEST test strip TEST once daily or as directed 100 each 1  . potassium chloride SA (K-DUR,KLOR-CON) 20 MEQ tablet TAKE ONE TABLET EVERY DAY 90 tablet 1  . rosuvastatin (CRESTOR) 20 MG tablet Take 1 tablet (20 mg total) by mouth daily. 3 tablet 3  . sitaGLIPtin (JANUVIA) 100 MG tablet Take 1 tablet (100 mg total) by mouth daily. 90 tablet 0  . traMADol (ULTRAM) 50 MG tablet TAKE ONE TABLET EVERY EIGHT HOURS AS NEEDED 90 tablet 5  . albuterol (PROVENTIL HFA) 108 (90 Base) MCG/ACT inhaler Inhale 2 puffs into the lungs every 6 (six) hours as needed for wheezing or shortness of breath. (Patient not taking: Reported on 08/05/2017) 1 Inhaler 1  . apixaban (ELIQUIS) 5 MG TABS tablet Take 5 mg by mouth.    . cefUROXime (CEFTIN) 500 MG tablet Take 1 tablet (500 mg total) by mouth 2 (two) times daily with a meal. (Patient not taking: Reported on 08/25/2017) 14 tablet 0   No facility-administered medications prior to visit.     Review of Systems;  Patient denies headache, fevers, malaise, unintentional weight loss, skin rash, eye pain, sinus congestion and sinus pain, sore throat, dysphagia,  hemoptysis , cough, dyspnea, wheezing, chest pain, palpitations, orthopnea, edema, abdominal pain, nausea, melena, diarrhea, constipation, flank pain, dysuria, hematuria, urinary  Frequency, nocturia, numbness, tingling, seizures,  Focal weakness, Loss of consciousness,  Tremor, insomnia, depression, anxiety, and suicidal ideation.      Objective:  BP (!) 150/80 (BP Location: Left Arm, Patient Position:  Sitting, Cuff Size: Large)   Pulse 79   Temp 97.8 F (36.6 C) (Oral)   Resp 16   Ht 5\' 6"  (1.676 m)   Wt 241 lb (109.3 kg)   SpO2 95%   BMI 38.90 kg/m   BP Readings from Last 3 Encounters:  08/25/17 (!) 150/80  08/19/17 127/74  08/16/17 131/71    Wt Readings from Last 3 Encounters:  08/25/17 241 lb (109.3 kg)  08/19/17 245 lb (111.1 kg)  08/15/17 250 lb (113.4 kg)    General appearance: alert, cooperative and appears stated age Ears: normal TM's and external ear canals both ears Throat: lips, mucosa, and tongue normal; teeth and gums normal Neck: no adenopathy, no carotid bruit, supple, symmetrical, trachea midline and thyroid  not enlarged, symmetric, no tenderness/mass/nodules Back: symmetric, no curvature. ROM normal. No CVA tenderness. Lungs: clear to auscultation bilaterally Heart: regular rate and rhythm, S1, S2 normal, no murmur, click, rub or gallop Abdomen: soft, non-tender; bowel sounds normal; no masses,  no organomegaly Pulses: 2+ and symmetric Skin: Skin color, texture, turgor normal. No rashes or lesions Lymph nodes: Cervical, supraclavicular, and axillary nodes normal.  Lab Results  Component Value Date   HGBA1C 7.8 (H) 08/05/2017   HGBA1C 7.3 (H) 05/04/2017   HGBA1C 7.1 (H) 01/27/2017    Lab Results  Component Value Date   CREATININE 1.45 08/25/2017   CREATININE 1.56 (H) 08/19/2017   CREATININE 1.75 (H) 08/15/2017    Lab Results  Component Value Date   WBC 6.6 08/19/2017   HGB 12.8 (L) 08/19/2017   HCT 38.6 (L) 08/19/2017   PLT 187 08/19/2017   GLUCOSE 162 (H) 08/25/2017   CHOL 166 08/05/2017   TRIG 162.0 (H) 08/05/2017   HDL 39.50 08/05/2017   LDLDIRECT 94.0 01/27/2017   LDLCALC 94 08/05/2017   ALT 48 08/19/2017   AST 38 08/19/2017   NA 138 08/25/2017   K 4.7 08/25/2017   CL 99 08/25/2017   CREATININE 1.45 08/25/2017   BUN 22 08/25/2017   CO2 33 (H) 08/25/2017   TSH 2.95 07/23/2016   PSA 1.60 09/24/2015   INR 0.99 08/15/2017    HGBA1C 7.8 (H) 08/05/2017   MICROALBUR 15.3 (H) 08/05/2017    US Venous Img Lower Unilateral Right  Result Date: 08/19/2017 CLINICAL DATA:  Right lower extremity swelling and redness. EXAM: RIGHT LOWER EXTREMITY VENOUS DOPPLER ULTRASOUND TECHNIQUE: Gray-scale sonography with graded compression, as well as color Doppler and duplex ultrasound were performed to evaluate the lower extremity deep venous systems from the level of the common femoral vein and including the common femoral, femoral, profunda femoral, popliteal and calf veins including the posterior tibial, peroneal and gastrocnemius veins when visible. The superficial great saphenous vein was also interrogated. Spectral Doppler was utilized to evaluate flow at rest and with distal augmentation maneuvers in the common femoral, femoral and popliteal veins. COMPARISON:  None. FINDINGS: Contralateral Common Femoral Vein: Respiratory phasicity is normal and symmetric with the symptomatic side. No evidence of thrombus. Normal compressibility. Common Femoral Vein: No evidence of thrombus. Normal compressibility, respiratory phasicity and response to augmentation. Saphenofemoral Junction: No evidence of thrombus. Normal compressibility and flow on color Doppler imaging. Profunda Femoral Vein: No evidence of thrombus. Normal compressibility and flow on color Doppler imaging. Femoral Vein: No evidence of thrombus. Normal compressibility, respiratory phasicity and response to augmentation. Popliteal Vein: No evidence of thrombus. Normal compressibility, respiratory phasicity and response to augmentation. Calf Veins: No evidence of thrombus. Normal compressibility and flow on color Doppler imaging. Superficial Great Saphenous Vein: No evidence of thrombus. Normal compressibility. Other Findings: Prominent right groin lymph node or nodes. Soft tissue swelling in the right calf. IMPRESSION: Negative for deep venous thrombosis in right lower extremity. Electronically  Signed   By: Markus Daft M.D.   On: 08/19/2017 17:04    Assessment & Plan:   Problem List Items Addressed This Visit    Acute renal failure (ARF) ([place]) - Primary    Noted during ER visit on jan 8.  Now resolved  Based on review of  Events and data from January 8, patient should have been admitted with metabolic encephalopathy secondary to sepsis       Relevant Orders   Basic metabolic panel (Completed)   Bacteremia  due to Streptococcus    He completed a 7 day course of cefuroxime for symptoms consistent with infection and one of four blood cultures positive for strep dysgalactiae, a group C strep associated with pharyngitis,  Cellulitis,  Intra abdominal infections and endocarditis.  He did have a red swollen right leg that was noted several days later upon return to ED and [place] suggested cellulitis .  His CBC had returned to normal by day 3 and he has been afebrile .  No further treatment given.       History of poliomyelitis   Suspected sleep apnea    Sleep study has been scheduled.       Venous stasis dermatitis of both lower extremities    aggravatedby recent illness and untreated sleep apnea. .  Increase lasix dose to 40 mg for 4 days,  And follow daily weights.  Resume 20 mg daily dose after that  Renal function had returned to normal by today's labs.   Lab Results  Component Value Date   CREATININE 1.45 08/25/2017   Lab Results  Component Value Date   NA 138 08/25/2017   K 4.7 08/25/2017   CL 99 08/25/2017   CO2 33 (H) 08/25/2017            A total of 40 minutes was spent with patient more than half of which was spent in counseling patient on the above mentioned issues , reviewing and explaining recent labs and imaging studies done, and coordination of care.  I have discontinued Jorden T. Coppinger's cefUROXime. I am also having him start on ALPRAZolam. Additionally, I am having him maintain his MULTIVITAL, clobetasol cream, latanoprost, Vitamin D-3, EPINEPHrine, INTRON  A, ONE TOUCH ULTRA TEST, famotidine, chlorpheniramine-HYDROcodone, albuterol, hydrALAZINE, hydrOXYzine, losartan, montelukast, potassium chloride SA, JARDIANCE, apixaban, carvedilol, sitaGLIPtin, amLODipine, clorazepate, traMADol, gabapentin, hydrALAZINE, metFORMIN, HYDROcodone-acetaminophen, HYDROcodone-acetaminophen, glipiZIDE, rosuvastatin, furosemide, and Medical Compression Stockings.  Meds ordered this encounter  Medications  . ALPRAZolam (XANAX) 0.5 MG tablet    Sig: Take 0.5 tablets (0.25 mg total) by mouth at bedtime as needed for anxiety or sleep.    Dispense:  30 tablet    Refill:  0    Medications Discontinued During This Encounter  Medication Reason  . cefUROXime (CEFTIN) 500 MG tablet Completed Course    Follow-up: Return in about 1 month (around 09/25/2017).   Crecencio Mc, MD

## 2017-08-25 NOTE — Patient Instructions (Addendum)
Taking an antibiotic can create an imbalance in the normal population of bacteria that live in the small intestine.  This imbalance can persist for 3 months.   I want you to eat a serving of yogurt for a minimum of 3 weeks to prevent a serious antibiotic associated diarrhea  Called clostridium dificile colitis that occurs when the bacteria population is altered .  Taking a probiotic may also prevent vaginitis due to yeast infections and can be continued indefinitely if you feel that it improves your digestion or your elimination (bowels).     Your leg swelling is  Due to venous stasis AND due to heart failure from your sleep apnea   Please increase your dose of lasix to 40 mg daily for the next  4  Days and weigh yourself every day to see how much fluid we are getting off.   Call me Tuesday with the readings.  Weigh yourself today on your home scale as a starting point.    I AM PRESCRIBING ALPRAZOLAM  To help you fall asleep for your sleep study.  Take a dose when you get there .  And take a second dose if you are wide awake when they shut the lights out   You can use 1/2 xanax if you are having trouble sleeping at home

## 2017-08-26 ENCOUNTER — Other Ambulatory Visit: Payer: Self-pay | Admitting: Internal Medicine

## 2017-08-27 ENCOUNTER — Encounter: Payer: Self-pay | Admitting: Internal Medicine

## 2017-08-27 DIAGNOSIS — Z8612 Personal history of poliomyelitis: Secondary | ICD-10-CM | POA: Insufficient documentation

## 2017-08-27 DIAGNOSIS — N179 Acute kidney failure, unspecified: Secondary | ICD-10-CM | POA: Insufficient documentation

## 2017-08-27 DIAGNOSIS — R7881 Bacteremia: Secondary | ICD-10-CM

## 2017-08-27 DIAGNOSIS — B955 Unspecified streptococcus as the cause of diseases classified elsewhere: Secondary | ICD-10-CM | POA: Insufficient documentation

## 2017-08-27 NOTE — Assessment & Plan Note (Signed)
Sleep study has been scheduled  °

## 2017-08-27 NOTE — Assessment & Plan Note (Addendum)
He completed a 7 day course of cefuroxime for symptoms consistent with infection and one of four blood cultures positive for strep dysgalactiae, a group C strep associated with pharyngitis,  Cellulitis,  Intra abdominal infections and endocarditis.  He did have a red swollen right leg that was noted several days later upon return to ED and [place] suggested cellulitis .  His CBC had returned to normal by day 3 and he has been afebrile .  No further treatment given.

## 2017-08-27 NOTE — Assessment & Plan Note (Addendum)
aggravatedby recent illness and untreated sleep apnea. .  Increase lasix dose to 40 mg for 4 days,  And follow daily weights.  Resume 20 mg daily dose after that  Renal function had returned to normal by today's labs.   Lab Results  Component Value Date   CREATININE 1.45 08/25/2017   Lab Results  Component Value Date   NA 138 08/25/2017   K 4.7 08/25/2017   CL 99 08/25/2017   CO2 33 (H) 08/25/2017

## 2017-08-27 NOTE — Assessment & Plan Note (Signed)
Noted during ER visit on jan 8.  Now resolved  Based on review of  Events and data from January 8, patient should have been admitted with metabolic encephalopathy secondary to sepsis

## 2017-08-30 DIAGNOSIS — R0683 Snoring: Secondary | ICD-10-CM | POA: Diagnosis not present

## 2017-08-30 DIAGNOSIS — R29818 Other symptoms and signs involving the nervous system: Secondary | ICD-10-CM | POA: Diagnosis not present

## 2017-08-30 DIAGNOSIS — I10 Essential (primary) hypertension: Secondary | ICD-10-CM | POA: Diagnosis not present

## 2017-08-30 DIAGNOSIS — G4739 Other sleep apnea: Secondary | ICD-10-CM | POA: Diagnosis not present

## 2017-08-30 DIAGNOSIS — G4733 Obstructive sleep apnea (adult) (pediatric): Secondary | ICD-10-CM | POA: Diagnosis not present

## 2017-09-01 ENCOUNTER — Other Ambulatory Visit: Payer: Self-pay | Admitting: Internal Medicine

## 2017-09-06 ENCOUNTER — Other Ambulatory Visit (INDEPENDENT_AMBULATORY_CARE_PROVIDER_SITE_OTHER): Payer: Medicare Other

## 2017-09-06 DIAGNOSIS — Z79899 Other long term (current) drug therapy: Secondary | ICD-10-CM

## 2017-09-06 LAB — CBC WITH DIFFERENTIAL/PLATELET
BASOS ABS: 0.1 10*3/uL (ref 0.0–0.1)
Basophils Relative: 1.2 % (ref 0.0–3.0)
Eosinophils Absolute: 0.2 10*3/uL (ref 0.0–0.7)
Eosinophils Relative: 2.5 % (ref 0.0–5.0)
HCT: 37.6 % — ABNORMAL LOW (ref 39.0–52.0)
Hemoglobin: 12.4 g/dL — ABNORMAL LOW (ref 13.0–17.0)
LYMPHS ABS: 0.9 10*3/uL (ref 0.7–4.0)
Lymphocytes Relative: 14.9 % (ref 12.0–46.0)
MCHC: 33 g/dL (ref 30.0–36.0)
MCV: 87.7 fl (ref 78.0–100.0)
MONOS PCT: 15.8 % — AB (ref 3.0–12.0)
Monocytes Absolute: 1 10*3/uL (ref 0.1–1.0)
NEUTROS ABS: 4.1 10*3/uL (ref 1.4–7.7)
NEUTROS PCT: 65.6 % (ref 43.0–77.0)
PLATELETS: 229 10*3/uL (ref 150.0–400.0)
RBC: 4.28 Mil/uL (ref 4.22–5.81)
RDW: 14.8 % (ref 11.5–15.5)
WBC: 6.2 10*3/uL (ref 4.0–10.5)

## 2017-09-06 LAB — COMPREHENSIVE METABOLIC PANEL
ALT: 18 U/L (ref 0–53)
AST: 20 U/L (ref 0–37)
Albumin: 3.8 g/dL (ref 3.5–5.2)
Alkaline Phosphatase: 60 U/L (ref 39–117)
BILIRUBIN TOTAL: 0.4 mg/dL (ref 0.2–1.2)
BUN: 28 mg/dL — AB (ref 6–23)
CO2: 32 meq/L (ref 19–32)
CREATININE: 1.62 mg/dL — AB (ref 0.40–1.50)
Calcium: 9.2 mg/dL (ref 8.4–10.5)
Chloride: 99 mEq/L (ref 96–112)
GFR: 44.62 mL/min — AB (ref >60.00)
GLUCOSE: 185 mg/dL — AB (ref 70–99)
Potassium: 4.3 mEq/L (ref 3.5–5.1)
SODIUM: 137 meq/L (ref 135–145)
Total Protein: 7.1 g/dL (ref 6.0–8.3)

## 2017-09-07 ENCOUNTER — Telehealth: Payer: Self-pay | Admitting: Internal Medicine

## 2017-09-07 DIAGNOSIS — J069 Acute upper respiratory infection, unspecified: Secondary | ICD-10-CM | POA: Diagnosis not present

## 2017-09-07 DIAGNOSIS — G4733 Obstructive sleep apnea (adult) (pediatric): Secondary | ICD-10-CM | POA: Insufficient documentation

## 2017-09-07 NOTE — Telephone Encounter (Signed)
Spoke with pt and he stated that he has already called and his appt is scheduled for March. While on the phone with the pt he stated that for about a week now he has had a deep cough, chest congestion and hoarseness. Pt is wanting to be seen. The only available appt I see is on Friday at 11:30am.

## 2017-09-07 NOTE — Telephone Encounter (Signed)
12:15 tomorrow create a slot.

## 2017-09-07 NOTE — Telephone Encounter (Signed)
Sleep study confirmed that patient has severe sleep apnea,  but will need a second study to determine paramters for CPAP use.   The [facility] neurology dept has tried to call him to set this up but can't reach him  They sent me a letter,.  He needs to call.  [phone] option 1  To make his appointment

## 2017-09-08 NOTE — Telephone Encounter (Signed)
LMTCB. Please transfer pt to our office.  

## 2017-09-08 NOTE — Telephone Encounter (Signed)
Spoke with pt and he stated that he went to the walk in clinic yesterday and they prescribed him some medication. The pt stated that he is starting to feel a little bit better. Explained to pt that if he isn't any better after finishing the antibiotic to please give our office a call. Pt gave a verbal understanding.

## 2017-09-12 ENCOUNTER — Ambulatory Visit: Payer: Self-pay | Admitting: Internal Medicine

## 2017-09-22 DIAGNOSIS — E1122 Type 2 diabetes mellitus with diabetic chronic kidney disease: Secondary | ICD-10-CM | POA: Diagnosis not present

## 2017-09-22 DIAGNOSIS — N183 Chronic kidney disease, stage 3 (moderate): Secondary | ICD-10-CM | POA: Diagnosis not present

## 2017-09-22 DIAGNOSIS — R809 Proteinuria, unspecified: Secondary | ICD-10-CM | POA: Diagnosis not present

## 2017-09-22 DIAGNOSIS — N179 Acute kidney failure, unspecified: Secondary | ICD-10-CM | POA: Diagnosis not present

## 2017-09-22 DIAGNOSIS — I129 Hypertensive chronic kidney disease with stage 1 through stage 4 chronic kidney disease, or unspecified chronic kidney disease: Secondary | ICD-10-CM | POA: Diagnosis not present

## 2017-10-03 ENCOUNTER — Other Ambulatory Visit: Payer: Self-pay | Admitting: Internal Medicine

## 2017-10-03 MED ORDER — CLORAZEPATE DIPOTASSIUM 7.5 MG PO TABS: 7.5000 mg | ORAL_TABLET | Freq: Two times a day (BID) | ORAL | 3 refills | Status: DC | PRN

## 2017-10-03 NOTE — Telephone Encounter (Signed)
Tranxene refilled but not for #90 ,  Only for #60/month( need to discuss frequency of use at next visit )

## 2017-10-03 NOTE — Telephone Encounter (Signed)
Refilled: 06/08/2017 Last OV: 08/25/2017 Next OV: not scheduled

## 2017-10-04 ENCOUNTER — Other Ambulatory Visit: Payer: Self-pay | Admitting: Internal Medicine

## 2017-10-04 NOTE — Telephone Encounter (Signed)
Printed, signed and faxed.  

## 2017-10-04 NOTE — Telephone Encounter (Signed)
Copied from [place]. Topic: Quick Communication - See Telephone Encounter >> Oct 04, 2017  3:10 PM Vernona Rieger wrote: CRM for notification. See Telephone encounter for:   10/04/17.  [facility] can not get the drug clorazepate (TRANXENE) 7.5 MG tablet. They have contacted another pharmacy & pt is aware. They can not transfer this due to the laws. [facility] [address] advise[facility]   Please call patient if this will be sent [phone]

## 2017-10-05 ENCOUNTER — Other Ambulatory Visit: Payer: Self-pay

## 2017-10-05 NOTE — Telephone Encounter (Signed)
[facility] [employer] and she stated that they do not have enough to fill the rx for Tranxene 7.5mg  she looked at different [facility] and stated [facility] [place] [facility] but called them and they stated that they do not have either. Is there something else that can possibly be sent in in place of the tranxene?

## 2017-10-05 NOTE — Telephone Encounter (Signed)
Please refax

## 2017-10-05 NOTE — Telephone Encounter (Signed)
Call pt and see how he is taking the medication.  Per last phone note, was to decrease the dose.  Also reviewed chart, alprazolam was sent in.  If out of meds and [facility] to [facility]ill,  Can refill with new directions (partial refill) and hold for Dr Lupita Dawn return to see what medication she wants to change him to.

## 2017-10-05 NOTE — Telephone Encounter (Signed)
  [facility] can not get the drug clorazepate (TRANXENE) 7.5 MG tablet. They have contacted another pharmacy. Pt is aware. They can not transfer this due to the law. [facility].  LOV 08/25/17 with Dr. Derrel Nip  [phone]   [address] [place]   [phone]   Please call patient if this will be sent.     [phone]

## 2017-10-06 DIAGNOSIS — B349 Viral infection, unspecified: Secondary | ICD-10-CM | POA: Diagnosis not present

## 2017-10-06 DIAGNOSIS — M7918 Myalgia, other site: Secondary | ICD-10-CM | POA: Diagnosis not present

## 2017-10-07 ENCOUNTER — Telehealth: Payer: Self-pay

## 2017-10-07 ENCOUNTER — Other Ambulatory Visit: Payer: Self-pay | Admitting: Internal Medicine

## 2017-10-07 NOTE — Telephone Encounter (Signed)
Spoke with pt and he stated that he was taking it as needed. Explained to pt that his prescription had been being written so that he could take three a day but he was informed that Dr. Derrel Nip had decreased it this time to only twice a day as needed for anxiety. Pt gave a verbal understanding. [facility] [address] and they stated that they had enough to fill the rx for the full 60 tablets so pt was made aware that was where his rx was sent. Pt gave a verbal understanding and stated that he would go by there next day to pick up the rx.

## 2017-10-07 NOTE — Telephone Encounter (Signed)
Copied from [place]. Topic: General - Other >> Aug 31, 2017  3:28 PM Synthia Innocent wrote: Reason for CRM: Per patient Dr Derrel Nip wanted his weights Weight 08/27/17 240 08/28/17 242 08/29/17 243 08/30/17 242 08/30/17 240

## 2017-10-07 NOTE — Telephone Encounter (Signed)
FYI

## 2017-10-10 DIAGNOSIS — Z79899 Other long term (current) drug therapy: Secondary | ICD-10-CM | POA: Diagnosis not present

## 2017-10-10 DIAGNOSIS — C84A0 Cutaneous T-cell lymphoma, unspecified, unspecified site: Secondary | ICD-10-CM | POA: Diagnosis not present

## 2017-10-14 ENCOUNTER — Other Ambulatory Visit: Payer: Self-pay | Admitting: Internal Medicine

## 2017-10-26 ENCOUNTER — Other Ambulatory Visit: Payer: Self-pay

## 2017-10-26 ENCOUNTER — Telehealth: Payer: Self-pay

## 2017-10-26 NOTE — Telephone Encounter (Signed)
Pt returned called, he confirmed that it is okay to use mail order.

## 2017-10-26 NOTE — Telephone Encounter (Signed)
LMTCB. Received a medication refill from [facility] mail order. Need to find out if the pt is still using the mail order service. PEC may speak with pt.

## 2017-10-27 MED ORDER — METFORMIN HCL 500 MG PO TABS: 500.0000 mg | ORAL_TABLET | Freq: Two times a day (BID) | ORAL | 1 refills | Status: DC

## 2017-10-27 MED ORDER — CARVEDILOL 3.125 MG PO TABS: ORAL_TABLET | ORAL | 1 refills | Status: DC

## 2017-10-27 MED ORDER — GABAPENTIN 300 MG PO CAPS: 300.0000 mg | ORAL_CAPSULE | Freq: Three times a day (TID) | ORAL | 1 refills | Status: DC

## 2017-10-27 MED ORDER — HYDRALAZINE HCL 25 MG PO TABS: 25.0000 mg | ORAL_TABLET | Freq: Three times a day (TID) | ORAL | 1 refills | Status: DC

## 2017-10-27 MED ORDER — SITAGLIPTIN PHOSPHATE 100 MG PO TABS: 100.0000 mg | ORAL_TABLET | Freq: Every day | ORAL | 1 refills | Status: DC

## 2017-10-27 NOTE — Telephone Encounter (Signed)
rxs have been faxed to [facility] per pt request.

## 2017-10-28 ENCOUNTER — Other Ambulatory Visit: Payer: Self-pay

## 2017-10-28 MED ORDER — ROSUVASTATIN CALCIUM 20 MG PO TABS: 20.0000 mg | ORAL_TABLET | Freq: Every day | ORAL | 1 refills | Status: DC

## 2017-10-30 DIAGNOSIS — Z9989 Dependence on other enabling machines and devices: Secondary | ICD-10-CM | POA: Diagnosis not present

## 2017-10-30 DIAGNOSIS — R0683 Snoring: Secondary | ICD-10-CM | POA: Diagnosis not present

## 2017-10-30 DIAGNOSIS — G4733 Obstructive sleep apnea (adult) (pediatric): Secondary | ICD-10-CM | POA: Diagnosis not present

## 2017-11-03 MED ORDER — HYDROXYZINE HCL 25 MG PO TABS: 25.0000 mg | ORAL_TABLET | Freq: Three times a day (TID) | ORAL | 1 refills | Status: DC | PRN

## 2017-11-03 MED ORDER — GLIPIZIDE 5 MG PO TABS: 2.5000 mg | ORAL_TABLET | Freq: Two times a day (BID) | ORAL | 1 refills | Status: DC

## 2017-11-03 MED ORDER — AMLODIPINE BESYLATE 10 MG PO TABS: 10.0000 mg | ORAL_TABLET | Freq: Every day | ORAL | 1 refills | Status: DC

## 2017-11-03 NOTE — Telephone Encounter (Signed)
Daniel Kline from [facility] calling and states that 6 medications have not been refilled for this pt and was first requested on 10/25/17. He would like to expedite the request for amlodipine, lasix, hydroxyzine, glipizide, latanoprost, and chlorazepate.

## 2017-11-03 NOTE — Addendum Note (Signed)
Addended by: Adair Laundry on: 11/03/2017 01:51 PM   Modules accepted: Orders

## 2017-11-03 NOTE — Telephone Encounter (Signed)
Medications requested have been sent other than the latanoprost and the clorazepate which he gets filled at [facility]. [facility] has been notified that these rxs have been sent and they are okay to expedite.

## 2017-11-08 DIAGNOSIS — H2513 Age-related nuclear cataract, bilateral: Secondary | ICD-10-CM | POA: Diagnosis not present

## 2017-11-08 LAB — HM DIABETES EYE EXAM

## 2017-11-14 ENCOUNTER — Telehealth: Payer: Self-pay | Admitting: Internal Medicine

## 2017-11-15 NOTE — Telephone Encounter (Signed)
Pt returned call. Pt says that he received 25MG  he need 50 MG of hydrALAZINE (APRESOLINE) 50 MG tablet   Pt would like to have sent via [facility]

## 2017-11-15 NOTE — Telephone Encounter (Signed)
LMTCB. Need to let pt know that we sent this medication into his mail order pharmacy on 11/03/2017. Need to find out if he is still wanting to use [facility] mail order or if he would like [place] to just use [facility].

## 2017-11-15 NOTE — Telephone Encounter (Signed)
Spoke with pt and he stated that he is taking both the 25mg  and 50mg  hydralazine TID. The pt stated that he received the 25mg  via mail order but has not received the 50mg . Don't see any documentation on this medication so just wanted to make sure that the pt is supposed to be on both the 25mg  and 50mg . If okay to fill pt would like medication sent to [facility] mail order.

## 2017-11-16 MED ORDER — HYDRALAZINE HCL 50 MG PO TABS: 50.0000 mg | ORAL_TABLET | Freq: Three times a day (TID) | ORAL | 1 refills | Status: DC

## 2017-11-17 ENCOUNTER — Encounter: Payer: Self-pay | Admitting: Internal Medicine

## 2017-11-17 NOTE — Telephone Encounter (Signed)
Left voicemail for patient that hydralazine 50mg  has been sent  into the pharmacy and to call the office if he has any questions or concerns.

## 2017-11-21 DIAGNOSIS — Z79899 Other long term (current) drug therapy: Secondary | ICD-10-CM | POA: Diagnosis not present

## 2017-11-21 DIAGNOSIS — C84A0 Cutaneous T-cell lymphoma, unspecified, unspecified site: Secondary | ICD-10-CM | POA: Diagnosis not present

## 2017-11-22 DIAGNOSIS — G4733 Obstructive sleep apnea (adult) (pediatric): Secondary | ICD-10-CM | POA: Diagnosis not present

## 2017-11-28 ENCOUNTER — Other Ambulatory Visit: Payer: Self-pay

## 2017-11-28 ENCOUNTER — Emergency Department
Admission: EM | Admit: 2017-11-28 | Discharge: 2017-11-28 | Disposition: A | Discharge status: Home / Self Care | Payer: Medicare Other | Attending: Student in an Organized Health Care Education/Training Program | Admitting: Student in an Organized Health Care Education/Training Program

## 2017-11-28 ENCOUNTER — Encounter: Payer: Self-pay | Admitting: Emergency Medicine

## 2017-11-28 DIAGNOSIS — I129 Hypertensive chronic kidney disease with stage 1 through stage 4 chronic kidney disease, or unspecified chronic kidney disease: Secondary | ICD-10-CM | POA: Diagnosis not present

## 2017-11-28 DIAGNOSIS — E1122 Type 2 diabetes mellitus with diabetic chronic kidney disease: Secondary | ICD-10-CM | POA: Diagnosis not present

## 2017-11-28 DIAGNOSIS — Z96649 Presence of unspecified artificial hip joint: Secondary | ICD-10-CM | POA: Diagnosis not present

## 2017-11-28 DIAGNOSIS — L851 Acquired keratosis [keratoderma] palmaris et plantaris: Secondary | ICD-10-CM | POA: Diagnosis not present

## 2017-11-28 DIAGNOSIS — Z7984 Long term (current) use of oral hypoglycemic drugs: Secondary | ICD-10-CM | POA: Insufficient documentation

## 2017-11-28 DIAGNOSIS — Z87891 Personal history of nicotine dependence: Secondary | ICD-10-CM | POA: Diagnosis not present

## 2017-11-28 DIAGNOSIS — L97512 Non-pressure chronic ulcer of other part of right foot with fat layer exposed: Secondary | ICD-10-CM | POA: Diagnosis not present

## 2017-11-28 DIAGNOSIS — I10 Essential (primary) hypertension: Secondary | ICD-10-CM | POA: Diagnosis not present

## 2017-11-28 DIAGNOSIS — Z7901 Long term (current) use of anticoagulants: Secondary | ICD-10-CM | POA: Diagnosis not present

## 2017-11-28 DIAGNOSIS — N182 Chronic kidney disease, stage 2 (mild): Secondary | ICD-10-CM | POA: Insufficient documentation

## 2017-11-28 DIAGNOSIS — E1142 Type 2 diabetes mellitus with diabetic polyneuropathy: Secondary | ICD-10-CM | POA: Diagnosis not present

## 2017-11-28 DIAGNOSIS — Z79899 Other long term (current) drug therapy: Secondary | ICD-10-CM | POA: Insufficient documentation

## 2017-11-28 DIAGNOSIS — B351 Tinea unguium: Secondary | ICD-10-CM | POA: Diagnosis not present

## 2017-11-28 MED ORDER — CARVEDILOL 3.125 MG PO TABS
3.1250 mg | ORAL_TABLET | Freq: Two times a day (BID) | ORAL | Status: DC
  Administered 2017-11-28: 3.125 mg via ORAL
  Filled 2017-11-28: qty 1

## 2017-11-28 MED ORDER — HYDRALAZINE HCL 50 MG PO TABS
50.0000 mg | ORAL_TABLET | Freq: Once | ORAL | Status: AC
  Administered 2017-11-28: 50 mg via ORAL
  Filled 2017-11-28 (×2): qty 1

## 2017-11-28 MED ORDER — AMLODIPINE BESYLATE 5 MG PO TABS
10.0000 mg | ORAL_TABLET | Freq: Once | ORAL | Status: AC
  Administered 2017-11-28: 10 mg via ORAL
  Filled 2017-11-28: qty 2

## 2017-11-28 NOTE — ED Triage Notes (Signed)
Seen by dr. Elvina Mattes today.  Had BP taken while at the office.   Blood pressure noted to be elevated with a diastolic of 970.  Patient states he has history of HTN and took medication this morning.  Patient was sent to ED for evaluation.

## 2017-11-28 NOTE — ED Provider Notes (Signed)
[facility] Emergency Department Provider Note    First MD Initiated Contact with Patient 11/28/17 1719     (approximate)  I have reviewed the triage vital signs and the nursing notes.   HISTORY  Chief Complaint Hypertension    HPI Daniel Kline is a 72 y.o. male a history of hypertension presents to the ER because his blood pressure was elevated podiatry clinic today.  Patient states he has been completely asymptomatic.  Denies any chest pain or shortness of breath.  No numbness or tingling.  No headaches.  States his been compliant with his medications.  States that he gets very frustrated and frequently is found to have high blood pressure whenever he checks and to see a new physician or go to clinic.  States his been measuring his blood pressures at home and they have been normal.  Past Medical History:  Diagnosis Date  . Arthritis   . Diabetes mellitus without complication ([place])   . HTN (hypertension)   . MRSA (methicillin resistant Staphylococcus aureus)   . Polio   . 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 right elbow surgery    . right knee surgery    . right shoulder surgery    . spleenectomy    . TOTAL HIP ARTHROPLASTY  04/2016   Patient Active Problem List   Diagnosis Date Noted  . OSA (obstructive sleep apnea) 09/07/2017  . History of poliomyelitis 08/27/2017  . Bacteremia due to Streptococcus 08/27/2017  . Acute renal failure (ARF) ([place]) 08/27/2017  . Long-term use of high-risk medication 08/07/2017  . Suspected sleep apnea 08/07/2017  . Hospital discharge follow-up 05/07/2017  . Asymptomatic gallstones 01/06/2016  . 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 Atrial flutter ([place]) 10/29/2015  . Bee sting-induced anaphylaxis 10/29/2015  . Venous stasis dermatitis of both lower extremities  10/29/2015  . Diabetes mellitus with nephropathy ([place]) 09/27/2015  . Obesity 09/27/2015  . Degenerative arthritis of hip 08/26/2015  . Arthritis of knee, degenerative 08/26/2015  . 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 Hypercholesterolemia 12/20/2011  . Infection and inflammatory reaction due to internal prosthetic device, implant, and graft 12/20/2011  . Infection with methicillin-resistant Staphylococcus aureus 12/20/2011  . Essential hypertension 01/12/2010      Prior to Admission medications   Medication Sig Start Date End Date Taking? Authorizing Provider  albuterol (PROVENTIL HFA) 108 (90 Base) MCG/ACT inhaler Inhale 2 puffs into the lungs every 6 (six) hours as needed for wheezing or shortness of breath. Patient not taking: Reported on 08/05/2017 11/10/16   Burnard Hawthorne, FNP  ALPRAZolam Duanne Moron) 0.5 MG tablet Take 0.5 tablets (0.25 mg total) by mouth at bedtime as needed for anxiety or sleep. 08/25/17   Crecencio Mc, MD  amLODipine (NORVASC) 10 MG tablet Take 1 tablet (10 mg total) by mouth daily. 11/03/17   Crecencio Mc, MD  apixaban (ELIQUIS) 5 MG TABS tablet Take 5 mg by mouth. 03/21/17 06/19/17  [provider]  carvedilol (COREG) 3.125 MG tablet TAKE ONE TABLET TWICE DAILY WITH A MEAL 10/27/17   Crecencio Mc, MD  chlorpheniramine-HYDROcodone (TUSSIONEX PENNKINETIC ER) 10-8 MG/5ML SUER Take 5 mLs by mouth at bedtime as needed for cough. 11/03/16   Burnard Hawthorne, FNP  Cholecalciferol (VITAMIN D-3) 1000 units CAPS Take 2 capsules by  mouth daily. Reported on 02/27/2016    [provider]  clobetasol cream (TEMOVATE) 2.59 % Apply 1 application topically 2 (two) times daily.  06/27/15   [provider]  clorazepate (TRANXENE) 7.5 MG tablet Take 1 tablet (7.5 mg total) by mouth 2 (two) times daily as needed for anxiety. 10/03/17   Crecencio Mc, MD  Elastic Bandages & Supports (MEDICAL COMPRESSION STOCKINGS) MISC 2 Units by Does not apply  route daily. 08/19/17   Merlyn Lot, MD  EPINEPHrine (EPIPEN 2-PAK) 0.3 mg/0.3 mL IJ SOAJ injection Inject 0.3 mLs (0.3 mg total) into the muscle once. 10/28/15   Crecencio Mc, MD  famotidine (PEPCID) 20 MG tablet TAKE 1 TABLET BY MOUTH TWICE DAILY 10/28/16   Crecencio Mc, MD  furosemide (LASIX) 20 MG tablet TAKE ONE TABLET BY MOUTH EVERY MORNING 10/14/17   Crecencio Mc, MD  gabapentin (NEURONTIN) 300 MG capsule TAKE 1 CAPSULE BY MOUTH 3 TIMES DAILY 08/26/17   Crecencio Mc, MD  gabapentin (NEURONTIN) 300 MG capsule Take 1 capsule (300 mg total) by mouth 3 (three) times daily. 10/27/17   Crecencio Mc, MD  glipiZIDE (GLUCOTROL) 5 MG tablet Take 0.5 tablets (2.5 mg total) by mouth 2 (two) times daily before a meal. 11/03/17   Crecencio Mc, MD  hydrALAZINE (APRESOLINE) 25 MG tablet TAKE ONE TABLET BY MOUTH 3 TIMES DAILY 09/01/17   Crecencio Mc, MD  hydrALAZINE (APRESOLINE) 25 MG tablet Take 1 tablet (25 mg total) by mouth 3 (three) times daily. 10/27/17   Crecencio Mc, MD  hydrALAZINE (APRESOLINE) 50 MG tablet Take 1 tablet (50 mg total) by mouth 3 (three) times daily. 11/16/17   Crecencio Mc, MD  HYDROcodone-acetaminophen (NORCO/VICODIN) 5-325 MG tablet Take 1 tablet by mouth 2 (two) times daily as needed for moderate pain. 08/05/17   Crecencio Mc, MD  HYDROcodone-acetaminophen (NORCO/VICODIN) 5-325 MG tablet Take 1 tablet by mouth 2 (two) times daily as needed for moderate pain. 08/05/17   Crecencio Mc, MD  hydrOXYzine (ATARAX/VISTARIL) 25 MG tablet Take 1 tablet (25 mg total) by mouth every 8 (eight) hours as needed. 11/03/17   Crecencio Mc, MD  INTRON A 6000000 UNIT/ML injection  02/04/16   [provider]  JARDIANCE 10 MG TABS tablet TAKE ONE TABLET EVERY DAY 04/18/17   Crecencio Mc, MD  latanoprost (XALATAN) 0.005 % ophthalmic solution Place 1 drop into the right eye at bedtime.    [provider]  losartan (COZAAR) 100 MG tablet TAKE ONE TABLET  EVERY DAY 02/28/17   Crecencio Mc, MD  metFORMIN (GLUCOPHAGE) 500 MG tablet Take 1 tablet (500 mg total) by mouth 2 (two) times daily. 10/27/17   Crecencio Mc, MD  montelukast (SINGULAIR) 10 MG tablet TAKE ONE TABLET BY MOUTH EVERY EVENING AT BEDTIME 03/07/17   Crecencio Mc, MD  Multiple Vitamins-Minerals (MULTIVITAL) tablet Take 1 tablet by mouth daily.      [provider]  ONE TOUCH ULTRA TEST test strip TEST once daily or as directed 08/03/16   Crecencio Mc, MD  potassium chloride SA (K-DUR,KLOR-CON) 20 MEQ tablet TAKE ONE TABLET EVERY DAY 03/22/17   Crecencio Mc, MD  rosuvastatin (CRESTOR) 20 MG tablet Take 1 tablet (20 mg total) by mouth daily. 10/28/17   Crecencio Mc, MD  sitaGLIPtin (JANUVIA) 100 MG tablet Take 1 tablet (100 mg total) by mouth daily. 10/27/17   Crecencio Mc, MD  traMADol (ULTRAM) 50 MG tablet TAKE ONE TABLET EVERY EIGHT HOURS AS NEEDED 06/27/17   Crecencio Mc, MD    Allergies Oxycodone; Hydromorphone; and Zolpidem    Social History Social History   Tobacco Use  . Smoking status: Former Smoker    Last attempt to quit: 09/23/1990    Years since quitting: 27.2  . Smokeless tobacco: Former Systems developer    Quit date: 02/25/2006  . Tobacco comment: tobacco use- no   Substance Use Topics  . Alcohol use: No    Alcohol/week: 0.0 oz  . Drug use: No    Review of Systems Patient denies headaches, rhinorrhea, blurry vision, numbness, shortness of breath, chest pain, edema, cough, abdominal pain, nausea, vomiting, diarrhea, dysuria, fevers, rashes or hallucinations unless otherwise stated above in HPI. ____________________________________________   PHYSICAL EXAM:  VITAL SIGNS: Vitals:   11/28/17 1626 11/28/17 1715  BP: (!) 184/86 (!) 184/92  Pulse:  72  Resp:  18  Temp:    SpO2:  97%    Constitutional: Alert and oriented. Well appearing and in no acute distress. Eyes: Conjunctivae are normal.  Head: Atraumatic. Nose: No  congestion/rhinnorhea. Mouth/Throat: Mucous membranes are moist.   Neck: No stridor. Painless ROM.  Cardiovascular: Normal rate, regular rhythm. Grossly normal heart sounds.  Good peripheral circulation. Respiratory: Normal respiratory effort.  No retractions. Lungs CTAB. Gastrointestinal: Soft and nontender. No distention. No abdominal bruits. No CVA tenderness. Genitourinary:  Musculoskeletal: No lower extremity tenderness nor edema.  No joint effusions. Neurologic:  Normal speech and language. No gross focal neurologic deficits are appreciated. No facial droop Skin:  Skin is warm, dry and intact. No rash noted. Psychiatric:  Speech and behavior are normal.  ____________________________________________   LABS (all labs ordered are listed, but only abnormal results are displayed)  No results found for this or any previous visit (from the past 24 hour(s)). ____________________________________________ ____________________________________________  [id number]   ____________________________________________   PROCEDURES  Procedure(s) performed:  Procedures    Critical Care performed: no ____________________________________________   INITIAL IMPRESSION / ASSESSMENT AND PLAN / ED COURSE  Pertinent labs & imaging results that were available during my care of the patient were reviewed by me and considered in my medical decision making (see chart for details).  DDX: htn, whitecoat htn, medication noncompliance  Daniel Kline is a 72 y.o. who presents to the ED with symptoms as described above.  Patient well-appearing and in no acute distress.  Patient seems to have clear white coat hypertension.  He is asymptomatic at this time.  Will give his evening medications.  Do not feel that further diagnostic testing clinically indicated at this time.  Sound like his blood pressures are fairly well-controlled at home.  Patient stable and appropriate for outpatient follow-up.      As  part of my medical decision making, I reviewed the following data within the [facility] notes reviewed and incorporated, Labs reviewed, notes from prior ED visits.  ____________________________________________   FINAL CLINICAL IMPRESSION(S) / ED DIAGNOSES  Final diagnoses:  White coat syndrome with hypertension  Hypertension, unspecified type      NEW MEDICATIONS STARTED DURING THIS VISIT:  New Prescriptions   No medications on file     Note:  This document was prepared using Dragon voice recognition software and may include unintentional dictation errors.    Merlyn Lot, MD 11/28/17 1806

## 2017-11-28 NOTE — ED Notes (Signed)
Sig pad not working, inst given to patient.

## 2017-12-01 DIAGNOSIS — G4733 Obstructive sleep apnea (adult) (pediatric): Secondary | ICD-10-CM | POA: Diagnosis not present

## 2017-12-06 DIAGNOSIS — E1142 Type 2 diabetes mellitus with diabetic polyneuropathy: Secondary | ICD-10-CM | POA: Diagnosis not present

## 2017-12-06 DIAGNOSIS — L309 Dermatitis, unspecified: Secondary | ICD-10-CM | POA: Diagnosis not present

## 2017-12-19 DIAGNOSIS — C4492 Squamous cell carcinoma of skin, unspecified: Secondary | ICD-10-CM

## 2017-12-19 DIAGNOSIS — C8400 Mycosis fungoides, unspecified site: Secondary | ICD-10-CM | POA: Diagnosis not present

## 2017-12-19 DIAGNOSIS — D485 Neoplasm of uncertain behavior of skin: Secondary | ICD-10-CM | POA: Diagnosis not present

## 2017-12-19 DIAGNOSIS — D239 Other benign neoplasm of skin, unspecified: Secondary | ICD-10-CM

## 2017-12-19 DIAGNOSIS — D2261 Melanocytic nevi of right upper limb, including shoulder: Secondary | ICD-10-CM | POA: Diagnosis not present

## 2017-12-19 DIAGNOSIS — D0461 Carcinoma in situ of skin of right upper limb, including shoulder: Secondary | ICD-10-CM | POA: Diagnosis not present

## 2017-12-19 DIAGNOSIS — D229 Melanocytic nevi, unspecified: Secondary | ICD-10-CM | POA: Diagnosis not present

## 2017-12-19 HISTORY — DX: Squamous cell carcinoma of skin, unspecified: C44.92

## 2017-12-19 HISTORY — DX: Other benign neoplasm of skin, unspecified: D23.9

## 2017-12-20 ENCOUNTER — Telehealth: Payer: Self-pay | Admitting: Internal Medicine

## 2017-12-20 DIAGNOSIS — I10 Essential (primary) hypertension: Secondary | ICD-10-CM

## 2017-12-20 NOTE — Telephone Encounter (Signed)
Please advise 

## 2017-12-20 NOTE — Telephone Encounter (Signed)
Copied from [place] [id number]. Topic: Quick Communication - See Telephone Encounter >> Dec 20, 2017  4:16 PM Vernona Rieger wrote: CRM for notification. See Telephone encounter for: 12/20/17.  Patient wants to know if he is supposed to be taking LOSARTAN. It is on his current medicine list, but he said he has not filled it lately  Patient needs refill on latanoprost (XALATAN) 0.005 % ophthalmic solution, [facility]

## 2017-12-21 MED ORDER — LATANOPROST 0.005 % OP SOLN: 1.0000 [drp] | Freq: Every day | OPHTHALMIC | 2 refills | Status: DC

## 2017-12-21 MED ORDER — LOSARTAN POTASSIUM 100 MG PO TABS: 100.0000 mg | ORAL_TABLET | Freq: Every day | ORAL | 1 refills | Status: DC

## 2017-12-21 NOTE — Telephone Encounter (Signed)
Spoke with pt to let him know that he is supposed to be taking the losartan. Refill has been sent in to [facility] by pt request. The pt stated that he would like to see if you would refill his latanoprost until he he gets in to see his new eye doctor. He stated that he has an appt to see him in November.

## 2017-12-21 NOTE — Telephone Encounter (Signed)
Done

## 2017-12-21 NOTE — Telephone Encounter (Signed)
Is pt supposed to be taking the losartan? It is in his medication list but he has not been taking it.

## 2017-12-21 NOTE — Telephone Encounter (Signed)
Yes, he is supposed to be takign losartan

## 2017-12-22 DIAGNOSIS — G4733 Obstructive sleep apnea (adult) (pediatric): Secondary | ICD-10-CM | POA: Diagnosis not present

## 2017-12-26 DIAGNOSIS — I129 Hypertensive chronic kidney disease with stage 1 through stage 4 chronic kidney disease, or unspecified chronic kidney disease: Secondary | ICD-10-CM | POA: Diagnosis not present

## 2017-12-26 DIAGNOSIS — N2581 Secondary hyperparathyroidism of renal origin: Secondary | ICD-10-CM | POA: Diagnosis not present

## 2017-12-26 DIAGNOSIS — E1122 Type 2 diabetes mellitus with diabetic chronic kidney disease: Secondary | ICD-10-CM | POA: Diagnosis not present

## 2017-12-26 DIAGNOSIS — N179 Acute kidney failure, unspecified: Secondary | ICD-10-CM | POA: Diagnosis not present

## 2017-12-26 DIAGNOSIS — C84A0 Cutaneous T-cell lymphoma, unspecified, unspecified site: Secondary | ICD-10-CM | POA: Diagnosis not present

## 2017-12-26 DIAGNOSIS — R809 Proteinuria, unspecified: Secondary | ICD-10-CM | POA: Diagnosis not present

## 2017-12-26 DIAGNOSIS — N183 Chronic kidney disease, stage 3 (moderate): Secondary | ICD-10-CM | POA: Diagnosis not present

## 2017-12-26 DIAGNOSIS — Z79899 Other long term (current) drug therapy: Secondary | ICD-10-CM | POA: Diagnosis not present

## 2017-12-30 DIAGNOSIS — I129 Hypertensive chronic kidney disease with stage 1 through stage 4 chronic kidney disease, or unspecified chronic kidney disease: Secondary | ICD-10-CM | POA: Diagnosis not present

## 2017-12-30 DIAGNOSIS — K210 Gastro-esophageal reflux disease with esophagitis: Secondary | ICD-10-CM | POA: Diagnosis not present

## 2017-12-30 DIAGNOSIS — G4733 Obstructive sleep apnea (adult) (pediatric): Secondary | ICD-10-CM | POA: Diagnosis not present

## 2017-12-30 DIAGNOSIS — I10 Essential (primary) hypertension: Secondary | ICD-10-CM | POA: Diagnosis not present

## 2017-12-30 DIAGNOSIS — N189 Chronic kidney disease, unspecified: Secondary | ICD-10-CM | POA: Diagnosis not present

## 2017-12-30 DIAGNOSIS — R609 Edema, unspecified: Secondary | ICD-10-CM | POA: Diagnosis not present

## 2017-12-30 DIAGNOSIS — R600 Localized edema: Secondary | ICD-10-CM | POA: Diagnosis not present

## 2018-01-03 ENCOUNTER — Telehealth: Payer: Self-pay | Admitting: *Deleted

## 2018-01-03 NOTE — Telephone Encounter (Signed)
FYI

## 2018-01-03 NOTE — Telephone Encounter (Signed)
Copied from [place] [id number]. Topic: General - Other >> Jan 03, 2018 10:05 AM Oneta Rack wrote: Relation to pt: self Call back number: [phone]   Reason for call:  Patient was seen by Madelynn Done, MD cardiologist last week and specialist requested patient to follow up with PCP. Requested patient to have office notes fax to PCP office [phone] prior to 01/09/18 follow up appointment, please not when received.  >> Jan 03, 2018 10:09 AM Oneta Rack wrote: Relation to pt: self Call back number: [phone]   Reason for call:  Patient was seen by Madelynn Done, MD cardiologist last week and specialist requested patient to follow up with PCP. Requested patient to have office notes fax to PCP office [phone] prior to 01/09/18 follow up appointment, please not when received.

## 2018-01-06 ENCOUNTER — Other Ambulatory Visit: Payer: Self-pay | Admitting: Internal Medicine

## 2018-01-09 ENCOUNTER — Other Ambulatory Visit: Payer: Self-pay | Admitting: Internal Medicine

## 2018-01-09 ENCOUNTER — Encounter: Payer: Self-pay | Admitting: Internal Medicine

## 2018-01-09 ENCOUNTER — Ambulatory Visit (INDEPENDENT_AMBULATORY_CARE_PROVIDER_SITE_OTHER): Payer: Medicare Other | Admitting: Internal Medicine

## 2018-01-09 VITALS — BP 170/94 | HR 96 | Temp 97.5°F | Resp 17 | Ht 66.0 in | Wt 250.8 lb

## 2018-01-09 DIAGNOSIS — E1121 Type 2 diabetes mellitus with diabetic nephropathy: Secondary | ICD-10-CM | POA: Diagnosis not present

## 2018-01-09 DIAGNOSIS — I10 Essential (primary) hypertension: Secondary | ICD-10-CM | POA: Diagnosis not present

## 2018-01-09 DIAGNOSIS — Z6837 Body mass index (BMI) 37.0-37.9, adult: Secondary | ICD-10-CM

## 2018-01-09 DIAGNOSIS — G4733 Obstructive sleep apnea (adult) (pediatric): Secondary | ICD-10-CM

## 2018-01-09 NOTE — Telephone Encounter (Signed)
Pt would like to get a refill for HYDROcodone-acetaminophen (NORCO/VICODIN) 5-325 MG tablet.   Call pt @ [phone]. Thank you!

## 2018-01-09 NOTE — Patient Instructions (Signed)
Please bring your home blood pressure machine with you to your Nurse visit so we can make sure it is accurateLY RECORDING YOUR READINGS    Please check blood sugars once daily, either fasting or 2 hours after a meal ,  So I can determine if A MEDICATION CHANGE IS NEEDED

## 2018-01-09 NOTE — Progress Notes (Signed)
Subjective:  Patient ID: Daniel Kline, male    DOB: 1945/09/09  Age: 72 y.o. MRN: [id number]  CC: The primary encounter diagnosis was Diabetes mellitus with nephropathy ([place]). Diagnoses of Class 2 severe obesity due to excess calories with serious comorbidity and body mass index (BMI) of 37.0 to 37.9 in adult [facility]), Essential hypertension, and OSA (obstructive sleep apnea) were also pertinent to this visit.  HPI Daniel Kline presents for  Follow up as requested by his pulmonology/sleep doctor Dr Early Osmond .  HTN;  Was sent to ER by podiatry for uncontrolled systolic hypertension.  He states that the ER physician reinforced Dr Hardin Negus' (his former PCP )'s prior diagnosis of  "white coat hypertension syndrome"  because his home readings have been normal.  However the home machine has never been brought to the office for calibration and it is a wrist monitor  Er records reviewed,  Diagnosis of [facility] not mentioned.   Diagnosed with OSA in April by sleep study and  BIPAP was prescribed.  He  has been using it for the last several weeks, averaging  8-9 hours of sleep .  . " I feel 72 years old  Now"  More energy,  Feels great.   Lab Results  Component Value Date   MICROALBUR 15.3 (H) 08/05/2017   5 month follow up on diabetes.  Patient has no complaints today.  Patient is following a low glycemic index diet soe days and taking all prescribed medications  Often not as prescribed, wiithout side effects.  Has bben taking a full glipzide in the morning and skipping the evening dose .  Fasting sugars have been under less than 120 most of the time and post prandials have been under 160 except on rare occasions (highest  Sugars was 270) . Takes an extra metformin when sugars are high.  Patient is exercising about 3 times per week and intentionally trying to lose weight .  Patient has had an eye exam in the last 12 months and checks feet regularly for signs of infection.  Patient does not walk barefoot  outside,  And denies an numbness tingling or burning in feet. Patient is up to date on all recommended vaccinations  Outpatient Medications Prior to Visit  Medication Sig Dispense Refill  . amLODipine (NORVASC) 10 MG tablet Take 1 tablet (10 mg total) by mouth daily. 90 tablet 1  . carvedilol (COREG) 3.125 MG tablet TAKE ONE TABLET TWICE DAILY WITH A MEAL 180 tablet 1  . Cholecalciferol (VITAMIN D-3) 1000 units CAPS Take 2 capsules by mouth daily. Reported on 02/27/2016    . clobetasol cream (TEMOVATE) 3.71 % Apply 1 application topically 2 (two) times daily.     . clorazepate (TRANXENE) 7.5 MG tablet Take 1 tablet (7.5 mg total) by mouth 2 (two) times daily as needed for anxiety. 60 tablet 3  . Elastic Bandages & Supports (MEDICAL COMPRESSION STOCKINGS) MISC 2 Units by Does not apply route daily. 2 each 0  . EPINEPHrine (EPIPEN 2-PAK) 0.3 mg/0.3 mL IJ SOAJ injection Inject 0.3 mLs (0.3 mg total) into the muscle once. 1 Device 1  . furosemide (LASIX) 20 MG tablet TAKE ONE TABLET BY MOUTH EVERY MORNING 90 tablet 1  . gabapentin (NEURONTIN) 300 MG capsule TAKE 1 CAPSULE BY MOUTH 3  TIMES DAILY 180 capsule 0  . glipiZIDE (GLUCOTROL) 5 MG tablet Take 0.5 tablets (2.5 mg total) by mouth 2 (two) times daily before a meal. 180 tablet 1  .  hydrALAZINE (APRESOLINE) 25 MG tablet Take 1 tablet (25 mg total) by mouth 3 (three) times daily. 270 tablet 1  . hydrALAZINE (APRESOLINE) 50 MG tablet Take 1 tablet (50 mg total) by mouth 3 (three) times daily. 90 tablet 1  . HYDROcodone-acetaminophen (NORCO/VICODIN) 5-325 MG tablet Take 1 tablet by mouth 2 (two) times daily as needed for moderate pain. 60 tablet 0  . HYDROcodone-acetaminophen (NORCO/VICODIN) 5-325 MG tablet Take 1 tablet by mouth 2 (two) times daily as needed for moderate pain. 60 tablet 0  . hydrOXYzine (ATARAX/VISTARIL) 25 MG tablet Take 1 tablet (25 mg total) by mouth every 8 (eight) hours as needed. 90 tablet 1  . INTRON A 6000000 UNIT/ML injection      . latanoprost (XALATAN) 0.005 % ophthalmic solution Place 1 drop into the right eye at bedtime. 2.5 mL 2  . losartan (COZAAR) 100 MG tablet Take 1 tablet (100 mg total) by mouth daily. 90 tablet 1  . metFORMIN (GLUCOPHAGE) 500 MG tablet Take 1 tablet (500 mg total) by mouth 2 (two) times daily. 180 tablet 1  . Multiple Vitamins-Minerals (MULTIVITAL) tablet Take 1 tablet by mouth daily.      . ONE TOUCH ULTRA TEST test strip TEST once daily or as directed 100 each 1  . rosuvastatin (CRESTOR) 20 MG tablet Take 1 tablet (20 mg total) by mouth daily. 90 tablet 1  . sitaGLIPtin (JANUVIA) 100 MG tablet Take 1 tablet (100 mg total) by mouth daily. 90 tablet 1  . hydrALAZINE (APRESOLINE) 25 MG tablet TAKE ONE TABLET BY MOUTH 3 TIMES DAILY 90 tablet 1  . JARDIANCE 10 MG TABS tablet TAKE ONE TABLET EVERY DAY (Patient not taking: Reported on 01/09/2018) 90 tablet 2  . traMADol (ULTRAM) 50 MG tablet TAKE ONE TABLET EVERY EIGHT HOURS AS NEEDED (Patient not taking: Reported on 01/09/2018) 90 tablet 5  . albuterol (PROVENTIL HFA) 108 (90 Base) MCG/ACT inhaler Inhale 2 puffs into the lungs every 6 (six) hours as needed for wheezing or shortness of breath. (Patient not taking: Reported on 08/05/2017) 1 Inhaler 1  . ALPRAZolam (XANAX) 0.5 MG tablet Take 0.5 tablets (0.25 mg total) by mouth at bedtime as needed for anxiety or sleep. (Patient not taking: Reported on 01/09/2018) 30 tablet 0  . apixaban (ELIQUIS) 5 MG TABS tablet Take 5 mg by mouth.    . chlorpheniramine-HYDROcodone (TUSSIONEX PENNKINETIC ER) 10-8 MG/5ML SUER Take 5 mLs by mouth at bedtime as needed for cough. (Patient not taking: Reported on 01/09/2018) 70 mL 0  . famotidine (PEPCID) 20 MG tablet TAKE 1 TABLET BY MOUTH TWICE DAILY (Patient not taking: Reported on 01/09/2018) 60 tablet 5  . montelukast (SINGULAIR) 10 MG tablet TAKE ONE TABLET BY MOUTH EVERY EVENING AT BEDTIME (Patient not taking: Reported on 01/09/2018) 30 tablet 3  . potassium chloride SA  (K-DUR,KLOR-CON) 20 MEQ tablet TAKE ONE TABLET EVERY DAY (Patient not taking: Reported on 01/09/2018) 90 tablet 1   No facility-administered medications prior to visit.     Review of Systems;  Patient denies headache, fevers, malaise, unintentional weight loss, skin rash, eye pain, sinus congestion and sinus pain, sore throat, dysphagia,  hemoptysis , cough, dyspnea, wheezing, chest pain, palpitations, orthopnea, edema, abdominal pain, nausea, melena, diarrhea, constipation, flank pain, dysuria, hematuria, urinary  Frequency, nocturia, numbness, tingling, seizures,  Focal weakness, Loss of consciousness,  Tremor, insomnia, depression, anxiety, and suicidal ideation.      Objective:  BP (!) 170/94 (BP Location: Left Arm, Patient Position: Sitting,  Cuff Size: Large)   Pulse 96   Temp (!) 97.5 F (36.4 C) (Oral)   Resp 17   Ht 5\' 6"  (1.676 m)   Wt 250 lb 12.8 oz (113.8 kg)   SpO2 96%   BMI 40.48 kg/m   BP Readings from Last 3 Encounters:  01/09/18 (!) 170/94  11/28/17 (!) 158/87  08/25/17 (!) 150/80    Wt Readings from Last 3 Encounters:  01/09/18 250 lb 12.8 oz (113.8 kg)  11/28/17 250 lb (113.4 kg)  08/25/17 241 lb (109.3 kg)    General appearance: alert, cooperative and appears stated age Ears: normal TM's and external ear canals both ears Throat: lips, mucosa, and tongue normal; teeth and gums normal Neck: no adenopathy, no carotid bruit, supple, symmetrical, trachea midline and thyroid not enlarged, symmetric, no tenderness/mass/nodules Back: symmetric, no curvature. ROM normal. No CVA tenderness. Lungs: clear to auscultation bilaterally Heart: regular rate and rhythm, S1, S2 normal, no murmur, click, rub or gallop Abdomen: soft, non-tender; bowel sounds normal; no masses,  no organomegaly Pulses: 2+ and symmetric Skin: Skin color, texture, turgor normal. No rashes or lesions Lymph nodes: Cervical, supraclavicular, and axillary nodes normal.  Lab Results  Component  Value Date   HGBA1C 7.8 (H) 08/05/2017   HGBA1C 7.3 (H) 05/04/2017   HGBA1C 7.1 (H) 01/27/2017    Lab Results  Component Value Date   CREATININE 1.62 (H) 09/06/2017   CREATININE 1.45 08/25/2017   CREATININE 1.56 (H) 08/19/2017    Lab Results  Component Value Date   WBC 6.2 09/06/2017   HGB 12.4 (L) 09/06/2017   HCT 37.6 (L) 09/06/2017   PLT 229.0 09/06/2017   GLUCOSE 185 (H) 09/06/2017   CHOL 166 08/05/2017   TRIG 162.0 (H) 08/05/2017   HDL 39.50 08/05/2017   LDLDIRECT 94.0 01/27/2017   LDLCALC 94 08/05/2017   ALT 18 09/06/2017   AST 20 09/06/2017   NA 137 09/06/2017   K 4.3 09/06/2017   CL 99 09/06/2017   CREATININE 1.62 (H) 09/06/2017   BUN 28 (H) 09/06/2017   CO2 32 09/06/2017   TSH 2.95 07/23/2016   PSA 1.60 09/24/2015   INR 0.99 08/15/2017   HGBA1C 7.8 (H) 08/05/2017   MICROALBUR 15.3 (H) 08/05/2017    No results found.  Assessment & Plan:   Problem List Items Addressed This Visit    OSA (obstructive sleep apnea)    Sleep study has been done and he has sevre OSA  He is using BiPAP regularly since April.   wearingBIPAP every night a minimum of 8 hours per night and notes improved daytime wakefulness and decreased fatigue        Obesity    I have congratulated her in reduction of   BMI and encouraged  Continued weight loss with goal of 10% of body weigh over the next 6 months using a low glycemic index diet and regular exercise a minimum of 5 days per week.        Essential hypertension    [place] suspected but not proven  Advised to return with home monitor for calibration       Diabetes mellitus with nephropathy ([place]) - Primary    Control unclear because of lack of records.  A1c ordered  .  Using glipizde incorrectly,  metformin and Januvia. Advised him to return with log of readings in 2 weeks  Lab Results  Component Value Date   HGBA1C 7.8 (H) 08/05/2017  Relevant Orders   Hemoglobin A1c   Comprehensive metabolic panel   Lipid panel        I have discontinued [facility]'s famotidine, chlorpheniramine-HYDROcodone, albuterol, montelukast, potassium chloride SA, apixaban, and ALPRAZolam. I am also having him maintain his MULTIVITAL, clobetasol cream, Vitamin D-3, EPINEPHrine, INTRON A, ONE TOUCH ULTRA TEST, JARDIANCE, traMADol, HYDROcodone-acetaminophen, HYDROcodone-acetaminophen, Medical Compression Stockings, clorazepate, furosemide, carvedilol, hydrALAZINE, metFORMIN, sitaGLIPtin, rosuvastatin, amLODipine, hydrOXYzine, glipiZIDE, hydrALAZINE, losartan, latanoprost, and gabapentin.  No orders of the defined types were placed in this encounter.   Medications Discontinued During This Encounter  Medication Reason  . albuterol (PROVENTIL HFA) 108 (90 Base) MCG/ACT inhaler Patient has not taken in last 30 days  . ALPRAZolam (XANAX) 0.5 MG tablet Patient has not taken in last 30 days  . apixaban (ELIQUIS) 5 MG TABS tablet Patient has not taken in last 30 days  . chlorpheniramine-HYDROcodone (TUSSIONEX PENNKINETIC ER) 10-8 MG/5ML SUER Patient has not taken in last 30 days  . famotidine (PEPCID) 20 MG tablet Patient has not taken in last 30 days  . hydrALAZINE (APRESOLINE) 25 MG tablet Duplicate  . montelukast (SINGULAIR) 10 MG tablet Patient has not taken in last 30 days  . potassium chloride SA (K-DUR,KLOR-CON) 20 MEQ tablet Patient has not taken in last 30 days   A total of 25 minutes of face to face time was spent with patient more than half of which was spent in counselling about the above mentioned conditions  and coordination of care    Follow-up: Return in about 2 weeks (around 01/23/2018).   Crecencio Mc, MD

## 2018-01-09 NOTE — Assessment & Plan Note (Addendum)
Control unclear because of lack of records.  A1c ordered  .  Using glipizde incorrectly,  metformin and Januvia. Advised him to return with log of readings in 2 weeks  Lab Results  Component Value Date   HGBA1C 7.8 (H) 08/05/2017

## 2018-01-10 LAB — COMPREHENSIVE METABOLIC PANEL
ALK PHOS: 57 U/L (ref 39–117)
ALT: 18 U/L (ref 0–53)
AST: 14 U/L (ref 0–37)
Albumin: 4.1 g/dL (ref 3.5–5.2)
BUN: 22 mg/dL (ref 6–23)
CHLORIDE: 103 meq/L (ref 96–112)
CO2: 31 meq/L (ref 19–32)
Calcium: 9.4 mg/dL (ref 8.4–10.5)
Creatinine, Ser: 1.51 mg/dL — ABNORMAL HIGH (ref 0.40–1.50)
GFR: 48.34 mL/min — AB (ref >60.00)
GLUCOSE: 144 mg/dL — AB (ref 70–99)
POTASSIUM: 3.8 meq/L (ref 3.5–5.1)
Sodium: 141 mEq/L (ref 135–145)
Total Bilirubin: 0.5 mg/dL (ref 0.2–1.2)
Total Protein: 6.7 g/dL (ref 6.0–8.3)

## 2018-01-10 LAB — LIPID PANEL
Cholesterol: 95 mg/dL (ref 0–200)
HDL: 27.6 mg/dL — AB (ref >39.00)
LDL Cholesterol: 33 mg/dL (ref 0–99)
NONHDL: 67.76
Total CHOL/HDL Ratio: 3
Triglycerides: 175 mg/dL — ABNORMAL HIGH (ref 0.0–149.0)
VLDL: 35 mg/dL (ref 0.0–40.0)

## 2018-01-10 LAB — HEMOGLOBIN A1C: HEMOGLOBIN A1C: 7.5 % — AB (ref 4.6–6.5)

## 2018-01-10 MED ORDER — HYDROCODONE-ACETAMINOPHEN 5-325 MG PO TABS: 1.0000 | ORAL_TABLET | Freq: Two times a day (BID) | ORAL | 0 refills | Status: DC | PRN

## 2018-01-10 NOTE — Telephone Encounter (Signed)
last refill was April 4 for #60 per [place] to refill s for 3 months printed,

## 2018-01-10 NOTE — Assessment & Plan Note (Signed)
[facility] suspected but not proven  Advised to return with home monitor for calibration

## 2018-01-10 NOTE — Assessment & Plan Note (Signed)
I have congratulated her in reduction of   BMI and encouraged  Continued weight loss with goal of 10% of body weigh over the next 6 months using a low glycemic index diet and regular exercise a minimum of 5 days per week.    

## 2018-01-10 NOTE — Telephone Encounter (Signed)
Refilled: 08/05/2017 Last OV: 01/09/2018 Next OV: 04/11/2018

## 2018-01-10 NOTE — Assessment & Plan Note (Signed)
Sleep study has been done and he has sevre OSA  He is using BiPAP regularly since April.   wearingBIPAP every night a minimum of 8 hours per night and notes improved daytime wakefulness and decreased fatigue

## 2018-01-11 NOTE — Telephone Encounter (Signed)
Spoke with pt and informed him that Dr. Derrel Nip did refill his medication for 3 months and that he would need to come by the office to pick them up to take to the pharmacy. Pt stated that he would be by this afternoon to pick them up.

## 2018-01-17 ENCOUNTER — Telehealth: Payer: Self-pay

## 2018-01-17 NOTE — Telephone Encounter (Signed)
Copied from [place] [id number]. Topic: Quick Communication - See Telephone Encounter >> Jan 17, 2018 10:42 AM Antonieta Iba C wrote: CRM for notification. See Telephone encounter for: 01/17/18.   Pt states that he Is returning call, not showing a note in chart or CRM. Please advise pt   CB: [phone]

## 2018-01-18 DIAGNOSIS — D0461 Carcinoma in situ of skin of right upper limb, including shoulder: Secondary | ICD-10-CM | POA: Diagnosis not present

## 2018-01-18 DIAGNOSIS — Z8589 Personal history of malignant neoplasm of other organs and systems: Secondary | ICD-10-CM

## 2018-01-18 HISTORY — DX: Personal history of malignant neoplasm of other organs and systems: Z85.89

## 2018-01-22 DIAGNOSIS — G4733 Obstructive sleep apnea (adult) (pediatric): Secondary | ICD-10-CM | POA: Diagnosis not present

## 2018-01-23 DIAGNOSIS — C8400 Mycosis fungoides, unspecified site: Secondary | ICD-10-CM | POA: Diagnosis not present

## 2018-01-23 DIAGNOSIS — Z79899 Other long term (current) drug therapy: Secondary | ICD-10-CM | POA: Diagnosis not present

## 2018-01-24 ENCOUNTER — Ambulatory Visit: Payer: Self-pay

## 2018-01-24 NOTE — Telephone Encounter (Signed)
Most have been an old message because I don't see a message in the chart.

## 2018-01-25 ENCOUNTER — Telehealth: Payer: Self-pay

## 2018-01-25 NOTE — Telephone Encounter (Signed)
Copied from [place] [phone]. Topic: General - Other >> Jan 25, 2018  1:52 PM Marin Olp L wrote: Reason for CRM: Patient's creatinine was high per [facility] test results and his wife Stanton Kidney would like to discuss what it means and if there is anything to be concerned about?

## 2018-01-25 NOTE — Telephone Encounter (Signed)
Please advise 

## 2018-01-25 NOTE — Telephone Encounter (Addendum)
It means his kidneys are not filtering as well as they used to.  She'll need to make an appointment with Mr Vandevoort if she wants to discuss it,  Or she can aks the doctor who ordered the tests.

## 2018-01-26 ENCOUNTER — Ambulatory Visit: Payer: Self-pay

## 2018-01-26 IMAGING — DX DG CHEST 1V PORT
1 series · 1 of 1 positions shown · non-contrast
Comparison: 04/14/2011

CLINICAL DATA: Sudden onset of fever and confusion.

EXAM:
PORTABLE CHEST 1 VIEW

[chest ap]
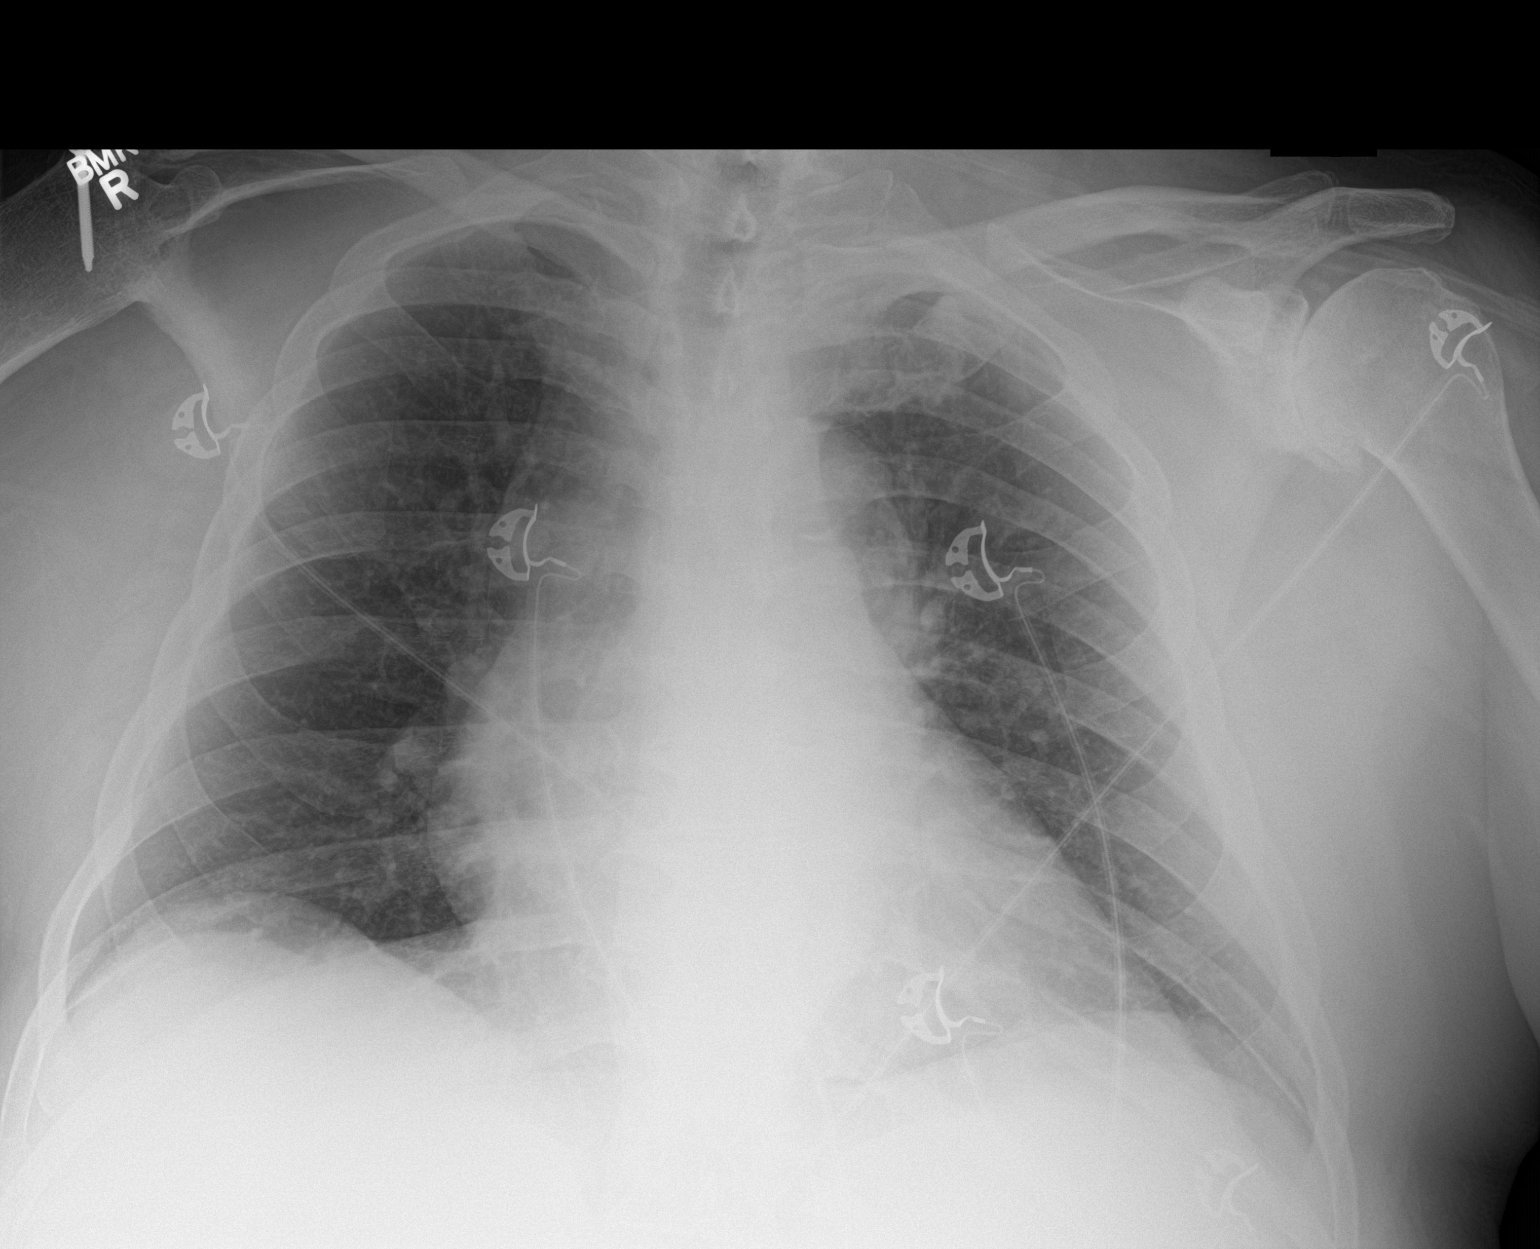

[1 of 1 positions shown; findings below may reference images not displayed]

FINDINGS: Stable cardiomegaly with aortic atherosclerosis. No pneumonic
consolidation or overt pulmonary edema. Chronic stable deformity of
the right humeral head and shaft with single screw fixation noted.
Marked osteoarthritic joint space narrowing spurring of the left
glenohumeral joint.
IMPRESSION: 1. Stable cardiomegaly.  No active pulmonary disease.
2. Osteoarthritis of the left glenohumeral joint.
3. Chronic posttraumatic deformity single screw fixation of the
proximal right humerus.

## 2018-01-27 ENCOUNTER — Telehealth: Payer: Self-pay

## 2018-01-27 NOTE — Telephone Encounter (Signed)
See previous message spoke with pt about message this morning.

## 2018-01-27 NOTE — Telephone Encounter (Signed)
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  That's why he sees Dr Rolly Salter.  His kidney function is monitored every 3 months when he comes to see me for diabetes follow up,  So he is due next in September

## 2018-01-27 NOTE — Telephone Encounter (Signed)
LMTCB. PEC may speak with pt.  

## 2018-01-27 NOTE — Telephone Encounter (Signed)
Spoke with Daniel Kline and he stated that the doctor that ordered this told him he would need to follow up with his PCP. The doctor however did tell the pt that the creatinine level may be elevated due to inadequate water intake. Pt stated he has increased his water intake and was wondering if he needed to come back in to have it rechecked.

## 2018-01-27 NOTE — Telephone Encounter (Signed)
Copied from [place] [phone]. Topic: General - Other >> Jan 25, 2018  1:52 PM Marin Olp L wrote: Reason for CRM: Patient's creatinine was high per [facility] test results and his wife Stanton Kidney would like to discuss what it means and if there is anything to be concerned about?  >> Jan 27, 2018 11:28 AM Boyd Kerbs wrote:

## 2018-02-02 ENCOUNTER — Other Ambulatory Visit: Payer: Self-pay | Admitting: Internal Medicine

## 2018-02-06 ENCOUNTER — Other Ambulatory Visit: Payer: Self-pay | Admitting: Internal Medicine

## 2018-02-07 DIAGNOSIS — B351 Tinea unguium: Secondary | ICD-10-CM | POA: Diagnosis not present

## 2018-02-07 DIAGNOSIS — L851 Acquired keratosis [keratoderma] palmaris et plantaris: Secondary | ICD-10-CM | POA: Diagnosis not present

## 2018-02-07 DIAGNOSIS — E1142 Type 2 diabetes mellitus with diabetic polyneuropathy: Secondary | ICD-10-CM | POA: Diagnosis not present

## 2018-02-13 ENCOUNTER — Other Ambulatory Visit: Payer: Self-pay | Admitting: Internal Medicine

## 2018-02-21 DIAGNOSIS — G4733 Obstructive sleep apnea (adult) (pediatric): Secondary | ICD-10-CM | POA: Diagnosis not present

## 2018-03-01 ENCOUNTER — Telehealth: Payer: Self-pay

## 2018-03-01 NOTE — Telephone Encounter (Signed)
Copied from [place] [phone]. Topic: Inquiry >> Mar 01, 2018  3:10 PM Neva Seat wrote: Pt wants to transfer care from Dr. Derrel Nip to Dr. Ma Hillock.  Pt will feel better having a male PCP. Please call pt back to let him know if approved and an appt.

## 2018-03-01 NOTE — Telephone Encounter (Signed)
That is fine with me.

## 2018-03-02 NOTE — Telephone Encounter (Signed)
Patient wants to transfer from Dr. Derrel Nip to Dr. Silvio Pate.

## 2018-03-03 NOTE — Telephone Encounter (Signed)
Okay if you can find a spot for him

## 2018-03-07 DIAGNOSIS — C84A0 Cutaneous T-cell lymphoma, unspecified, unspecified site: Secondary | ICD-10-CM | POA: Diagnosis not present

## 2018-03-07 DIAGNOSIS — Z79899 Other long term (current) drug therapy: Secondary | ICD-10-CM | POA: Diagnosis not present

## 2018-03-08 DIAGNOSIS — G4733 Obstructive sleep apnea (adult) (pediatric): Secondary | ICD-10-CM | POA: Diagnosis not present

## 2018-03-13 DIAGNOSIS — H401111 Primary open-angle glaucoma, right eye, mild stage: Secondary | ICD-10-CM | POA: Diagnosis not present

## 2018-03-14 ENCOUNTER — Other Ambulatory Visit: Payer: Self-pay | Admitting: Internal Medicine

## 2018-03-24 DIAGNOSIS — G4733 Obstructive sleep apnea (adult) (pediatric): Secondary | ICD-10-CM | POA: Diagnosis not present

## 2018-03-27 ENCOUNTER — Other Ambulatory Visit: Payer: Self-pay | Admitting: Internal Medicine

## 2018-03-30 ENCOUNTER — Telehealth: Payer: Self-pay | Admitting: Internal Medicine

## 2018-03-30 NOTE — Telephone Encounter (Signed)
Copied from [place] [phone]. Topic: Quick Communication - See Telephone Encounter >> Mar 30, 2018  2:51 PM Rutherford Nail, NT wrote: CRM for notification. See Telephone encounter for: 03/30/18. Carole with [facility] calling to see if the patient was still taking losartan (COZAAR) 100 MG tablet or if he was had changed medications to hydrALAZINE (APRESOLINE) 50 MG tablet? Please advise. CB#: [phone]

## 2018-03-31 NOTE — Telephone Encounter (Signed)
Please advise 

## 2018-03-31 NOTE — Telephone Encounter (Signed)
Patient returned call and informed PEC that he is still taking both the Losartan and the Hydralazine.  He stated he is taking all the meds that they told him to take.  He would still like a call back because he has some questions.  Please advise.  CB# [phone].

## 2018-03-31 NOTE — Telephone Encounter (Signed)
He should be taking both,  Based on last visit

## 2018-03-31 NOTE — Telephone Encounter (Signed)
LMTCB. Need to find out if the pt is still taking Losartan 100mg  and the Hydralazine 50mg ? PEC may speak with pt.

## 2018-03-31 NOTE — Telephone Encounter (Signed)
Pharmacy is calling wanting to know if the pt is supposed to be taking both the losartan and the hydralazine? Pt stated that he is taking both.

## 2018-03-31 NOTE — Telephone Encounter (Signed)
Spoke with pharmacy to let them know that the pt is supposed to be taking both the losartan and the hydralazine.

## 2018-03-31 NOTE — Telephone Encounter (Signed)
FYI

## 2018-04-03 ENCOUNTER — Other Ambulatory Visit: Payer: Self-pay | Admitting: Internal Medicine

## 2018-04-04 ENCOUNTER — Other Ambulatory Visit: Payer: Self-pay

## 2018-04-04 MED ORDER — CLORAZEPATE DIPOTASSIUM 7.5 MG PO TABS: 7.5000 mg | ORAL_TABLET | Freq: Two times a day (BID) | ORAL | 3 refills | Status: DC | PRN

## 2018-04-04 NOTE — Telephone Encounter (Signed)
Refilled: 10/03/2017 Last OV: 01/09/2018 Next OV: 04/11/2018  Would like sent to [facility] on [address] Abbott Pao states it is cheaper there.

## 2018-04-04 NOTE — Telephone Encounter (Signed)
Spoke with pt and he stated that he just had a question about the hydralazine because he had received a refill from [facility] that had a name on that he had never heard before so he contacted the pharmacy and they stated that it was the hydralazine.

## 2018-04-06 DIAGNOSIS — Z79899 Other long term (current) drug therapy: Secondary | ICD-10-CM | POA: Diagnosis not present

## 2018-04-06 DIAGNOSIS — C84A0 Cutaneous T-cell lymphoma, unspecified, unspecified site: Secondary | ICD-10-CM | POA: Diagnosis not present

## 2018-04-09 ENCOUNTER — Other Ambulatory Visit: Payer: Self-pay | Admitting: Internal Medicine

## 2018-04-11 ENCOUNTER — Encounter: Payer: Self-pay | Admitting: Internal Medicine

## 2018-04-11 ENCOUNTER — Ambulatory Visit (INDEPENDENT_AMBULATORY_CARE_PROVIDER_SITE_OTHER): Payer: Medicare Other | Admitting: Internal Medicine

## 2018-04-11 VITALS — BP 170/88 | HR 80 | Temp 98.2°F | Resp 16 | Ht 66.0 in | Wt 246.2 lb

## 2018-04-11 DIAGNOSIS — G8929 Other chronic pain: Secondary | ICD-10-CM | POA: Diagnosis not present

## 2018-04-11 DIAGNOSIS — E1121 Type 2 diabetes mellitus with diabetic nephropathy: Secondary | ICD-10-CM

## 2018-04-11 DIAGNOSIS — M25569 Pain in unspecified knee: Secondary | ICD-10-CM

## 2018-04-11 DIAGNOSIS — I10 Essential (primary) hypertension: Secondary | ICD-10-CM

## 2018-04-11 MED ORDER — HYDROCODONE-ACETAMINOPHEN 5-325 MG PO TABS: 1.0000 | ORAL_TABLET | Freq: Two times a day (BID) | ORAL | 0 refills | Status: DC | PRN

## 2018-04-11 MED ORDER — EPINEPHRINE 0.3 MG/0.3ML IJ SOAJ: 0.3000 mg | Freq: Once | INTRAMUSCULAR | 1 refills | Status: AC

## 2018-04-11 NOTE — Progress Notes (Signed)
Subjective:  Patient ID: Daniel Kline, male    DOB: Dec 08, 1945  Age: 72 y.o. MRN: [id number]  CC: The primary encounter diagnosis was Essential hypertension. Diagnoses of Diabetes mellitus with nephropathy ([place]) and Chronic knee pain, unspecified laterality were also pertinent to this visit.  HPI Daniel Kline presents for 3 month follow up on diabetes.  Patient has no complaints today.  Patient is following a low glycemic index diet and taking all prescribed medications regularly without side effects. STOPPED JARDIANCE AND TAKING JANUVIA  Fasting sugars have been under less than 140 most of the time and post prandials have been under 160 except on rare occasions. Patient is exercising about 3 times per week and intentionally trying to lose weight .  Patient has had an eye exam in the last 12 months and checks feet regularly for signs of infection.  Patient does not walk barefoot outside,  And denies an numbness tingling or burning in feet. Patient is up to date on all recommended vaccinations  Uncontrolled Hypertension: patient checks blood pressure daily at home using a wrist cuff .  Readings have been for the most part > 150/80 at rest . His home machine is brought in today for comparison and is 20 pts lower than our reading .  Reviewed his medications again today atient is following a reduce salt diet most days and is taking medications as prescribed.   Using his CPAP 100% OF THE TIME .  "CAN'T SLEEP WITHOUT IT"   Mowed the grass yesterday with a push mower  Took all day.    Pain management :  Using Vicodin two times daily , tramadol not helping.  Taking Aleve on an average of 2 daily ,  Not more than Once a week . Took 2 yesterday. Has had bilateral hip replacements,  Operations but replacements on right knee x 2  .  Riding an exercise bike  For 30 minutes.  Walking with 2 25 lb cattle bells,  Squats etc. Has not been swimming    Lab Results  Component Value Date   HGBA1C 7.5 (H)  01/09/2018     Outpatient Medications Prior to Visit  Medication Sig Dispense Refill  . amLODipine (NORVASC) 10 MG tablet TAKE 1 TABLET BY MOUTH  DAILY 90 tablet 1  . carvedilol (COREG) 3.125 MG tablet TAKE 1 TABLET BY MOUTH 2  TIMES DAILY WITH A MEAL 180 tablet 1  . Cholecalciferol (VITAMIN D-3) 1000 units CAPS Take 2 capsules by mouth daily. Reported on 02/27/2016    . clobetasol cream (TEMOVATE) 3.54 % Apply 1 application topically 2 (two) times daily.     . clorazepate (TRANXENE) 7.5 MG tablet Take 1 tablet (7.5 mg total) by mouth 2 (two) times daily as needed for anxiety. 60 tablet 3  . Elastic Bandages & Supports (MEDICAL COMPRESSION STOCKINGS) MISC 2 Units by Does not apply route daily. 2 each 0  . furosemide (LASIX) 20 MG tablet TAKE ONE TABLET BY MOUTH EVERY MORNING 90 tablet 1  . gabapentin (NEURONTIN) 300 MG capsule TAKE 1 CAPSULE BY MOUTH 3  TIMES DAILY 180 capsule 0  . glipiZIDE (GLUCOTROL) 5 MG tablet Take 0.5 tablets (2.5 mg total) by mouth 2 (two) times daily before a meal. 180 tablet 1  . hydrALAZINE (APRESOLINE) 25 MG tablet Take 1 tablet (25 mg total) by mouth 3 (three) times daily. 270 tablet 1  . hydrALAZINE (APRESOLINE) 50 MG tablet TAKE 1 TABLET BY MOUTH 3  TIMES DAILY  180 tablet 1  . hydrOXYzine (ATARAX/VISTARIL) 25 MG tablet TAKE ONE TABLET BY MOUTH EVERY 8 HOURS AS NEEDED 90 tablet 1  . INTRON A 6000000 UNIT/ML injection     . JANUVIA 100 MG tablet TAKE 1 TABLET BY MOUTH  DAILY 90 tablet 1  . latanoprost (XALATAN) 0.005 % ophthalmic solution Place 1 drop into the right eye at bedtime. 2.5 mL 2  . losartan (COZAAR) 100 MG tablet Take 1 tablet (100 mg total) by mouth daily. 90 tablet 1  . metFORMIN (GLUCOPHAGE) 500 MG tablet TAKE 1 TABLET BY MOUTH TWO  TIMES DAILY 180 tablet 1  . Multiple Vitamins-Minerals (MULTIVITAL) tablet Take 1 tablet by mouth daily.      . ONE TOUCH ULTRA TEST test strip CHECK BLOOD SUGAR 3 TIMES DAILY AS DIRECTED 100 each 1  . rosuvastatin  (CRESTOR) 20 MG tablet TAKE 1 TABLET BY MOUTH  DAILY 90 tablet 1  . traMADol (ULTRAM) 50 MG tablet TAKE ONE TABLET EVERY EIGHT HOURS AS NEEDED 90 tablet 5  . EPINEPHrine (EPIPEN 2-PAK) 0.3 mg/0.3 mL IJ SOAJ injection Inject 0.3 mLs (0.3 mg total) into the muscle once. 1 Device 1  . hydrOXYzine (ATARAX/VISTARIL) 25 MG tablet TAKE 1 TABLET BY MOUTH  EVERY 8 HOURS AS NEEDED 180 tablet 0  . gabapentin (NEURONTIN) 300 MG capsule TAKE 1 CAPSULE BY MOUTH 3 TIMES DAILY (Patient not taking: Reported on 04/11/2018) 90 capsule 2  . hydrALAZINE (APRESOLINE) 50 MG tablet TAKE 1 TABLET BY MOUTH 3  TIMES DAILY (Patient not taking: Reported on 04/11/2018) 180 tablet 0  . HYDROcodone-acetaminophen (NORCO/VICODIN) 5-325 MG tablet Take 1 tablet by mouth 2 (two) times daily as needed for moderate pain. (Patient not taking: Reported on 04/11/2018) 60 tablet 0  . HYDROcodone-acetaminophen (NORCO/VICODIN) 5-325 MG tablet Take 1 tablet by mouth 2 (two) times daily as needed for moderate pain. (Patient not taking: Reported on 04/11/2018) 60 tablet 0  . JARDIANCE 10 MG TABS tablet TAKE ONE TABLET EVERY DAY (Patient not taking: Reported on 01/09/2018) 90 tablet 2   No facility-administered medications prior to visit.     Review of Systems;  Patient denies headache, fevers, malaise, unintentional weight loss, skin rash, eye pain, sinus congestion and sinus pain, sore throat, dysphagia,  hemoptysis , cough, dyspnea, wheezing, chest pain, palpitations, orthopnea, edema, abdominal pain, nausea, melena, diarrhea, constipation, flank pain, dysuria, hematuria, urinary  Frequency, nocturia, numbness, tingling, seizures,  Focal weakness, Loss of consciousness,  Tremor, insomnia, depression, anxiety, and suicidal ideation.      Objective:  BP (!) 170/88 (BP Location: Left Arm, Patient Position: Sitting, Cuff Size: Large)   Pulse 80   Temp 98.2 F (36.8 C) (Oral)   Resp 16   Ht 5\' 6"  (1.676 m)   Wt 246 lb 3.2 oz (111.7 kg)   SpO2 96%    BMI 39.74 kg/m   BP Readings from Last 3 Encounters:  04/11/18 (!) 170/88  01/09/18 (!) 170/94  11/28/17 (!) 158/87    Wt Readings from Last 3 Encounters:  04/11/18 246 lb 3.2 oz (111.7 kg)  01/09/18 250 lb 12.8 oz (113.8 kg)  11/28/17 250 lb (113.4 kg)    General appearance: alert, cooperative and appears stated age Ears: normal TM's and external ear canals both ears Throat: lips, mucosa, and tongue normal; teeth and gums normal Neck: no adenopathy, no carotid bruit, supple, symmetrical, trachea midline and thyroid not enlarged, symmetric, no tenderness/mass/nodules Back: symmetric, no curvature. ROM normal. No CVA tenderness.  Lungs: clear to auscultation bilaterally Heart: regular rate and rhythm, S1, S2 normal, no murmur, click, rub or gallop Abdomen: soft, non-tender; bowel sounds normal; no masses,  no organomegaly Pulses: 2+ and symmetric Skin: Skin color, texture, turgor normal. No rashes or lesions Lymph nodes: Cervical, supraclavicular, and axillary nodes normal.  Lab Results  Component Value Date   HGBA1C 7.5 (H) 01/09/2018   HGBA1C 7.8 (H) 08/05/2017   HGBA1C 7.3 (H) 05/04/2017    Lab Results  Component Value Date   CREATININE 1.51 (H) 01/09/2018   CREATININE 1.62 (H) 09/06/2017   CREATININE 1.45 08/25/2017    Lab Results  Component Value Date   WBC 6.2 09/06/2017   HGB 12.4 (L) 09/06/2017   HCT 37.6 (L) 09/06/2017   PLT 229.0 09/06/2017   GLUCOSE 144 (H) 01/09/2018   CHOL 95 01/09/2018   TRIG 175.0 (H) 01/09/2018   HDL 27.60 (L) 01/09/2018   LDLDIRECT 94.0 01/27/2017   LDLCALC 33 01/09/2018   ALT 18 01/09/2018   AST 14 01/09/2018   NA 141 01/09/2018   K 3.8 01/09/2018   CL 103 01/09/2018   CREATININE 1.51 (H) 01/09/2018   BUN 22 01/09/2018   CO2 31 01/09/2018   TSH 2.95 07/23/2016   PSA 1.60 09/24/2015   INR 0.99 08/15/2017   HGBA1C 7.5 (H) 01/09/2018   MICROALBUR 15.3 (H) 08/05/2017    No results found.  Assessment & Plan:   Problem  List Items Addressed This Visit    Diabetes mellitus with nephropathy ([place])    BLOOD SUGARS REVIEWED,  Advised to increase his evening  Dose to  in evening to 5 mg ,  Continue 2.5 mg in the am.  Continue Januvia and metfromin      Essential hypertension - Primary    Uncontrolled despite use of 4 medications  Amlodipine carvedilol, furosemide, hydralazine maximal dose of losartan.  OSA has been addressed with CPAP .  RAS ruled out.  Not taking NSAIDS<  Adamant  about compliance.  24 hour BP monitor ordered       Relevant Orders   Ambulatory referral to Cardiology   Knee pain, chronic    Managed with Vicodin  Bid.  Instructed to avoid NSAIDs Refill history confirmed via [place] Controlled Substance databas, accessed by me today.Marland Kitchen  Refill history confirmed via [place] Controlled Substance databas, accessed by me today...  refills given       Relevant Medications   HYDROcodone-acetaminophen (NORCO/VICODIN) 5-325 MG tablet   HYDROcodone-acetaminophen (NORCO/VICODIN) 5-325 MG tablet (Start on 05/11/2018)   HYDROcodone-acetaminophen (NORCO/VICODIN) 5-325 MG tablet (Start on 06/10/2018)   HYDROcodone-acetaminophen (NORCO/VICODIN) 5-325 MG tablet (Start on 06/10/2018)      I have discontinued Daniel Kline's JARDIANCE. I am also having him maintain his MULTIVITAL, clobetasol cream, Vitamin D-3, INTRON A, traMADol, Medical Compression Stockings, furosemide, hydrALAZINE, glipiZIDE, losartan, latanoprost, gabapentin, ONE TOUCH ULTRA TEST, hydrOXYzine, hydrOXYzine, hydrALAZINE, carvedilol, rosuvastatin, metFORMIN, JANUVIA, clorazepate, amLODipine, HYDROcodone-acetaminophen, HYDROcodone-acetaminophen, HYDROcodone-acetaminophen, and HYDROcodone-acetaminophen.  Meds ordered this encounter  Medications  . EPINEPHrine (EPIPEN 2-PAK) 0.3 mg/0.3 mL IJ SOAJ injection    Sig: Inject 0.3 mLs (0.3 mg total) into the muscle once for 1 dose.    Dispense:  1 Device    Refill:  1  . HYDROcodone-acetaminophen  (NORCO/VICODIN) 5-325 MG tablet    Sig: Take 1 tablet by mouth 2 (two) times daily as needed for moderate pain.    Dispense:  60 tablet    Refill:  0    For  chronic knee pain  . HYDROcodone-acetaminophen (NORCO/VICODIN) 5-325 MG tablet    Sig: Take 1 tablet by mouth 2 (two) times daily as needed for moderate pain.    Dispense:  60 tablet    Refill:  0    May refill on or After May 11 2018  . HYDROcodone-acetaminophen (NORCO/VICODIN) 5-325 MG tablet    Sig: Take 1 tablet by mouth 2 (two) times daily as needed for moderate pain.    Dispense:  60 tablet    Refill:  0    For chronic knee pain.  May refill on or after Jun 10 2018  . HYDROcodone-acetaminophen (NORCO/VICODIN) 5-325 MG tablet    Sig: Take 1 tablet by mouth 2 (two) times daily as needed for moderate pain.    Dispense:  60 tablet    Refill:  0    For chronic knee pain    Medications Discontinued During This Encounter  Medication Reason  . gabapentin (NEURONTIN) 517 MG capsule Duplicate  . hydrALAZINE (APRESOLINE) 50 MG tablet Duplicate  . JARDIANCE 10 MG TABS tablet Change in therapy  . EPINEPHrine (EPIPEN 2-PAK) 0.3 mg/0.3 mL IJ SOAJ injection Reorder  . HYDROcodone-acetaminophen (NORCO/VICODIN) 5-325 MG tablet Reorder  . HYDROcodone-acetaminophen (NORCO/VICODIN) 5-325 MG tablet Reorder    Follow-up: Return in about 3 months (around 07/11/2018).   Crecencio Mc, MD

## 2018-04-11 NOTE — Patient Instructions (Addendum)
Your home blood pressure machine is not accurate.   It is 20 pts lower than our reading today .   Please purchase one that uses your arm , not your wrist.  (GET ONE FOR LARGE ARMS)  If you can't  ,  Continue  using the  Wrist cuff and ADD 20 PTS TO YOUR READINGS   AND recheck your readings for one week,  Then return [place] are being referred to  [facility] for  A 24 hour ambulatory BP monitor      Your evening blood sugars are still higher than normal.  Please Increase the glipizide dose at dinnertime to full tablet ( 5mg ) and continue  1/2 table at breakfast (2.5 mg) .  contnue metformin as well  Check bedtime and fasting sugars (before breakfast)  for the next 2 weeks   Vicodin refills for September  October and Novembenr have been sent to [place]

## 2018-04-12 ENCOUNTER — Telehealth: Payer: Self-pay | Admitting: Internal Medicine

## 2018-04-12 DIAGNOSIS — G8929 Other chronic pain: Secondary | ICD-10-CM | POA: Insufficient documentation

## 2018-04-12 DIAGNOSIS — M25569 Pain in unspecified knee: Secondary | ICD-10-CM

## 2018-04-12 NOTE — Assessment & Plan Note (Signed)
Uncontrolled despite use of 4 medications  Amlodipine carvedilol, furosemide, hydralazine maximal dose of losartan.  OSA has been addressed with CPAP .  RAS ruled out.  Not taking NSAIDS<  Adamant  about compliance.  24 hour BP monitor ordered

## 2018-04-12 NOTE — Assessment & Plan Note (Signed)
BLOOD SUGARS REVIEWED,  Advised to increase his evening  Dose to  in evening to 5 mg ,  Continue 2.5 mg in the am.  Continue Januvia and metfromin

## 2018-04-12 NOTE — Assessment & Plan Note (Signed)
Managed with Vicodin  Bid.  Instructed to avoid NSAIDs Refill history confirmed via [place] Controlled Substance databas, accessed by me today.Marland Kitchen  Refill history confirmed via [place] Controlled Substance databas, accessed by me today...  refills given

## 2018-04-12 NOTE — Telephone Encounter (Signed)
Copied from [place] [id number]. Topic: Inquiry >> Apr 12, 2018 11:49 AM Scherrie Gerlach wrote: Reason for CRM: pt wants to know should he take his bp readings before or after he takes his bp medicine.  I spoke with Luther Redo., Dr. Lupita Dawn CMA who advised that the patient is to take the BP medication then approximately 2 hours later check his BP.  The purpose of these BP checks are to make sure the BP medication is working appropriately for the patient.  This information was left in a message without patient information and the patient was advised to call if he had any additional questions.

## 2018-04-24 DIAGNOSIS — G4733 Obstructive sleep apnea (adult) (pediatric): Secondary | ICD-10-CM | POA: Diagnosis not present

## 2018-04-25 ENCOUNTER — Other Ambulatory Visit: Payer: Self-pay

## 2018-04-25 DIAGNOSIS — Z85828 Personal history of other malignant neoplasm of skin: Secondary | ICD-10-CM | POA: Diagnosis not present

## 2018-04-25 DIAGNOSIS — L98499 Non-pressure chronic ulcer of skin of other sites with unspecified severity: Secondary | ICD-10-CM | POA: Diagnosis not present

## 2018-04-25 DIAGNOSIS — D485 Neoplasm of uncertain behavior of skin: Secondary | ICD-10-CM | POA: Diagnosis not present

## 2018-04-25 MED ORDER — HYDRALAZINE HCL 25 MG PO TABS: 25.0000 mg | ORAL_TABLET | Freq: Three times a day (TID) | ORAL | 1 refills | Status: DC

## 2018-04-26 ENCOUNTER — Ambulatory Visit (INDEPENDENT_AMBULATORY_CARE_PROVIDER_SITE_OTHER): Payer: Medicare Other | Admitting: *Deleted

## 2018-04-26 ENCOUNTER — Encounter: Payer: Self-pay | Admitting: *Deleted

## 2018-04-26 VITALS — BP 168/90 | HR 69 | Resp 18

## 2018-04-26 DIAGNOSIS — I10 Essential (primary) hypertension: Secondary | ICD-10-CM | POA: Diagnosis not present

## 2018-04-26 NOTE — Progress Notes (Addendum)
Patient here for nurse visit BP check per order from 04/11/18, patient was to bring home cuff for comparison , but patient forgot cuff and left at home . Patient presented CBG reading and home BP readings from 04/11/18.Marland Kitchen  Placed home readings in yellow results folder.  Patient reports compliance with prescribed BP medications: Yes  Last dose of BP medication:  Hydralazine at about 2 PM.    BP Readings from Last 3 Encounters:  04/26/18 (!) 168/90  04/11/18 (!) 170/88  01/09/18 (!) 170/94   Pulse Readings from Last 3 Encounters:  04/26/18 69  04/11/18 80  01/09/18 96      Patient verbalized understanding of instructions.   Kerin Salen, LPN   Patient will need to increase his dose of hydralazine  To 50 mg three times daily if he is still taking te 25 mg dose.  Both doses are in chart I have dc'd the 25 mg dose. So it doe snt get refilled

## 2018-04-27 NOTE — Addendum Note (Signed)
Addended by: Crecencio Mc on: 04/27/2018 10:21 AM   Modules accepted: Orders

## 2018-04-27 NOTE — Progress Notes (Signed)
Patient is taking 75 mg three times daily, that he takes the 50 mg and the 25 mg.

## 2018-04-27 NOTE — Progress Notes (Signed)
Left message for patient to return call to office. 

## 2018-04-28 MED ORDER — HYDRALAZINE HCL 100 MG PO TABS: 100.0000 mg | ORAL_TABLET | Freq: Three times a day (TID) | ORAL | 3 refills | Status: DC

## 2018-04-28 NOTE — Progress Notes (Addendum)
Ask to increase dose to 100 mg three times daily ,  Chart updated and rx sent

## 2018-04-28 NOTE — Assessment & Plan Note (Signed)
hydralzien dose increased form 75 mg tid to 100 mg tid  Sept 20 2019

## 2018-04-28 NOTE — Progress Notes (Signed)
Patient notified and voiced understanding.

## 2018-04-28 NOTE — Addendum Note (Signed)
Addended by: Crecencio Mc on: 04/28/2018 10:40 AM   Modules accepted: Orders

## 2018-05-08 DIAGNOSIS — C84A0 Cutaneous T-cell lymphoma, unspecified, unspecified site: Secondary | ICD-10-CM | POA: Diagnosis not present

## 2018-05-08 DIAGNOSIS — Z79899 Other long term (current) drug therapy: Secondary | ICD-10-CM | POA: Diagnosis not present

## 2018-05-10 DIAGNOSIS — B351 Tinea unguium: Secondary | ICD-10-CM | POA: Diagnosis not present

## 2018-05-10 DIAGNOSIS — E1142 Type 2 diabetes mellitus with diabetic polyneuropathy: Secondary | ICD-10-CM | POA: Diagnosis not present

## 2018-05-10 DIAGNOSIS — L851 Acquired keratosis [keratoderma] palmaris et plantaris: Secondary | ICD-10-CM | POA: Diagnosis not present

## 2018-05-10 DIAGNOSIS — M2042 Other hammer toe(s) (acquired), left foot: Secondary | ICD-10-CM | POA: Diagnosis not present

## 2018-05-22 ENCOUNTER — Other Ambulatory Visit: Payer: Self-pay | Admitting: Internal Medicine

## 2018-05-24 DIAGNOSIS — G4733 Obstructive sleep apnea (adult) (pediatric): Secondary | ICD-10-CM | POA: Diagnosis not present

## 2018-06-08 NOTE — Progress Notes (Signed)
Cardiology Office Note  Date:  06/09/2018   ID:  Daniel Kline, DOB [birth year], MRN [id number]  PCP:  Daniel Carbon, MD   Chief Complaint  Patient presents with  . other    Ref by Dr. Derrel Kline for HTN. Meds reviewed by the pt. verbally. Pt. had a cardiac ablation 2019 at [facility]).      HPI:  [name] is a 72 year old gentleman with past medical history of Diabetes Chronic pain Obstructive sleep apnea, uses CPAP Referred by Dr. Derrel Kline for hypertension  Reports having high blood pressure and primary care doctor visits Recently seen by primary care April 11, 2018, blood pressure 170/88 Reports compliance with his medications  Good exercise tolerance Does elliptical, swimming, no walking  Able to ride elliptical 30 minutes  Blood pressure medications include  amlodipine, carvedilol, hydralazine, losartan, Lasix  Reports recent Push mowing, had no symptoms  Chronic knee pain, chronic back pain Likes to lift weights, 100s of pounds at the time " I like a big neck" Lifted 100s of pounds for many years to get a big neck  Lab work reviewed with him CR 1.4 to 1.6 HBA1C 7.5 Total chol 95, LDL 33  EKG personally reviewed by myself on todays visit Shows normal sinus rhythm rate 69 bpm no significant ST or T wave changes   PMH:   has a past medical history of Arthritis, Diabetes mellitus without complication ([place]), HTN (hypertension), MRSA (methicillin resistant Staphylococcus aureus), Polio, and 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  PSH:    Past Surgical History:  Procedure Laterality Date  . CARDIAC ELECTROPHYSIOLOGY STUDY AND ABLATION  2019  . right elbow surgery    . right knee surgery    . right shoulder surgery    . spleenectomy    . TOTAL HIP ARTHROPLASTY  04/2016    Current Outpatient Medications  Medication Sig Dispense Refill  . amLODipine (NORVASC) 10 MG tablet TAKE 1 TABLET BY MOUTH  DAILY 90 tablet 1  . carvedilol (COREG) 6.25  MG tablet TAKE 1 TABLET BY MOUTH 2  TIMES DAILY WITH A MEAL 180 tablet 3  . Cholecalciferol (VITAMIN D-3) 1000 units CAPS Take 2 capsules by mouth daily. Reported on 02/27/2016    . clobetasol cream (TEMOVATE) 2.22 % Apply 1 application topically 2 (two) times daily.     . clorazepate (TRANXENE) 7.5 MG tablet Take 1 tablet (7.5 mg total) by mouth 2 (two) times daily as needed for anxiety. 60 tablet 3  . Elastic Bandages & Supports (MEDICAL COMPRESSION STOCKINGS) MISC 2 Units by Does not apply route daily. 2 each 0  . furosemide (LASIX) 20 MG tablet TAKE ONE TABLET BY MOUTH EVERY MORNING 90 tablet 1  . gabapentin (NEURONTIN) 300 MG capsule TAKE 1 CAPSULE BY MOUTH 3  TIMES DAILY 180 capsule 0  . glipiZIDE (GLUCOTROL) 5 MG tablet Take 0.5 tablets (2.5 mg total) by mouth 2 (two) times daily before a meal. 180 tablet 1  . hydrALAZINE (APRESOLINE) 100 MG tablet Take 1 tablet (100 mg total) by mouth 3 (three) times daily. 90 tablet 3  . HYDROcodone-acetaminophen (NORCO/VICODIN) 5-325 MG tablet Take 1 tablet by mouth 2 (two) times daily as needed for moderate pain. 60 tablet 0  . INTRON A 6000000 UNIT/ML injection     . JANUVIA 100 MG tablet TAKE 1 TABLET BY MOUTH  DAILY 90 tablet 1  . latanoprost (XALATAN) 0.005 % ophthalmic solution Place 1 drop into the right eye at bedtime.  2.5 mL 2  . losartan (COZAAR) 100 MG tablet Take 1 tablet (100 mg total) by mouth daily. 90 tablet 1  . metFORMIN (GLUCOPHAGE) 500 MG tablet TAKE 1 TABLET BY MOUTH TWO  TIMES DAILY 180 tablet 1  . Multiple Vitamins-Minerals (MULTIVITAL) tablet Take 1 tablet by mouth daily.      . ONE TOUCH ULTRA TEST test strip CHECK BLOOD SUGAR 3 TIMES DAILY AS DIRECTED 100 each 1  . rosuvastatin (CRESTOR) 20 MG tablet TAKE 1 TABLET BY MOUTH  DAILY 90 tablet 1  . traMADol (ULTRAM) 50 MG tablet TAKE ONE TABLET EVERY EIGHT HOURS AS NEEDED 90 tablet 5   No current facility-administered medications for this visit.      Allergies:   Oxycodone;  Hydromorphone; and Zolpidem   Social History:  The patient  reports that he quit smoking about 27 years ago. He quit smokeless tobacco use about 12 years ago. He reports that he does not drink alcohol or use drugs.   Family History:   family history is not on file. He was adopted.    Review of Systems: Review of Systems  Constitutional: Negative.   Respiratory: Negative.   Cardiovascular: Negative.   Gastrointestinal: Negative.   Musculoskeletal: Negative.   Neurological: Negative.   Psychiatric/Behavioral: Negative.   All other systems reviewed and are negative.   PHYSICAL EXAM: VS:  BP (!) 148/70 (BP Location: Left Arm, Patient Position: Sitting, Cuff Size: Large)   Pulse 69   Ht 5\' 6"  (1.676 m)   Wt 240 lb (108.9 kg)   BMI 38.74 kg/m  , BMI Body mass index is 38.74 kg/m. GEN: Well nourished, well developed, in no acute distress  HEENT: normal  Neck: no JVD, carotid bruits, or masses Cardiac: RRR; no murmurs, rubs, or gallops,no edema  Respiratory:  clear to auscultation bilaterally, normal work of breathing GI: soft, nontender, nondistended, + BS MS: no deformity or atrophy  Skin: warm and dry, no rash Neuro:  Strength and sensation are intact Psych: euthymic mood, full affect   Recent Labs: 09/06/2017: Hemoglobin 12.4; Platelets 229.0 01/09/2018: ALT 18; BUN 22; Creatinine, Ser 1.51; Potassium 3.8; Sodium 141    Lipid Panel Lab Results  Component Value Date   CHOL 95 01/09/2018   HDL 27.60 (L) 01/09/2018   LDLCALC 33 01/09/2018   TRIG 175.0 (H) 01/09/2018      Wt Readings from Last 3 Encounters:  06/09/18 240 lb (108.9 kg)  04/11/18 246 lb 3.2 oz (111.7 kg)  01/09/18 250 lb 12.8 oz (113.8 kg)       ASSESSMENT AND PLAN:  Diabetes mellitus with nephropathy ([place]) - Plan: EKG 12-Lead Stressed importance of low carbohydrate diet Dietary guide provided to him Recommended low carbohydrates HBA1C 7.5 Goal hemoglobin A1c less than 7  CKD (chronic kidney  disease), stage II Stable CR 1.4 to 1.6 Etiology likely secondary to poor control diabetes Stressed the importance of low sugars, low carbohydrates, weight loss  Essential hypertension - Plan: EKG 12-Lead Will increase coreg up to 6.25 mg twice a day Continue other medications, closely monitor blood pressure at home using blood pressure cuff He will call us with blood pressure measurements  OSA (obstructive sleep apnea) On CPAP, doing well, compliant  Disposition:   F/U  6 months   Total encounter time more than 45 minutes  Greater than 50% was spent in counseling and coordination of care with the patient    Orders Placed This Encounter  Procedures  . EKG  12-Lead     Signed, Esmond Plants, M.D., Ph.D. 06/09/2018  [place], [place]

## 2018-06-09 ENCOUNTER — Encounter: Payer: Self-pay | Admitting: Cardiovascular Disease

## 2018-06-09 ENCOUNTER — Ambulatory Visit: Payer: Medicare Other | Admitting: Cardiovascular Disease

## 2018-06-09 VITALS — BP 148/70 | HR 69 | Ht 66.0 in | Wt 240.0 lb

## 2018-06-09 DIAGNOSIS — M545 Low back pain, unspecified: Secondary | ICD-10-CM | POA: Insufficient documentation

## 2018-06-09 DIAGNOSIS — I10 Essential (primary) hypertension: Secondary | ICD-10-CM

## 2018-06-09 DIAGNOSIS — G4733 Obstructive sleep apnea (adult) (pediatric): Secondary | ICD-10-CM | POA: Diagnosis not present

## 2018-06-09 DIAGNOSIS — E1121 Type 2 diabetes mellitus with diabetic nephropathy: Secondary | ICD-10-CM | POA: Diagnosis not present

## 2018-06-09 DIAGNOSIS — G8929 Other chronic pain: Secondary | ICD-10-CM

## 2018-06-09 DIAGNOSIS — N182 Chronic kidney disease, stage 2 (mild): Secondary | ICD-10-CM | POA: Diagnosis not present

## 2018-06-09 MED ORDER — CARVEDILOL 6.25 MG PO TABS: ORAL_TABLET | ORAL | 3 refills | Status: DC

## 2018-06-09 NOTE — Patient Instructions (Addendum)
Try massage envy, If no improvement, you might need chiropractic   Medication Instructions:   Please increase the coreg up to 6.25 mg twice a day  If you need a refill on your cardiac medications before your next appointment, please call your pharmacy.   Lab work: No new labs needed   If you have labs (blood work) drawn today and your tests are completely normal, you will receive your results only by: Marland Kitchen MyChart Message (if you have MyChart) OR . A paper copy in the mail If you have any lab test that is abnormal or we need to change your treatment, we will call you to review the results.   Testing/Procedures: No new testing needed   Follow-Up: At [facility], you and your health needs are our priority.  As part of our continuing mission to provide you with exceptional heart care, we have created designated Provider Care Teams.  These Care Teams include your primary Cardiologist (physician) and Advanced Practice Providers (APPs -  Physician Assistants and Nurse Practitioners) who all work together to provide you with the care you need, when you need it.  . You will need a follow up appointment in 6 months .   Please call our office 2 months in advance to schedule this appointment.    . Providers on your designated Care Team:   . Murray Hodgkins, NP . Christell Faith, PA-C . Marrianne Mood, PA-C  Any Other Special Instructions Will Be Listed Below (If Applicable).  For educational health videos Log in to : [email or web address] Or : [email or web address], password : [email or web address]

## 2018-06-10 ENCOUNTER — Other Ambulatory Visit: Payer: Self-pay | Admitting: Internal Medicine

## 2018-06-12 ENCOUNTER — Other Ambulatory Visit: Payer: Self-pay

## 2018-06-12 DIAGNOSIS — C84A0 Cutaneous T-cell lymphoma, unspecified, unspecified site: Secondary | ICD-10-CM | POA: Diagnosis not present

## 2018-06-12 DIAGNOSIS — Z79899 Other long term (current) drug therapy: Secondary | ICD-10-CM | POA: Diagnosis not present

## 2018-06-12 DIAGNOSIS — I10 Essential (primary) hypertension: Secondary | ICD-10-CM

## 2018-06-12 MED ORDER — LOSARTAN POTASSIUM 100 MG PO TABS: 100.0000 mg | ORAL_TABLET | Freq: Every day | ORAL | 1 refills | Status: DC

## 2018-06-15 DIAGNOSIS — N183 Chronic kidney disease, stage 3 (moderate): Secondary | ICD-10-CM | POA: Diagnosis not present

## 2018-06-15 DIAGNOSIS — E1129 Type 2 diabetes mellitus with other diabetic kidney complication: Secondary | ICD-10-CM | POA: Diagnosis not present

## 2018-06-15 DIAGNOSIS — R809 Proteinuria, unspecified: Secondary | ICD-10-CM | POA: Diagnosis not present

## 2018-06-15 DIAGNOSIS — I10 Essential (primary) hypertension: Secondary | ICD-10-CM | POA: Diagnosis not present

## 2018-06-15 DIAGNOSIS — N2889 Other specified disorders of kidney and ureter: Secondary | ICD-10-CM | POA: Diagnosis not present

## 2018-06-21 DIAGNOSIS — L812 Freckles: Secondary | ICD-10-CM | POA: Diagnosis not present

## 2018-06-21 DIAGNOSIS — L578 Other skin changes due to chronic exposure to nonionizing radiation: Secondary | ICD-10-CM | POA: Diagnosis not present

## 2018-06-21 DIAGNOSIS — D485 Neoplasm of uncertain behavior of skin: Secondary | ICD-10-CM | POA: Diagnosis not present

## 2018-06-21 DIAGNOSIS — L821 Other seborrheic keratosis: Secondary | ICD-10-CM | POA: Diagnosis not present

## 2018-06-21 DIAGNOSIS — L820 Inflamed seborrheic keratosis: Secondary | ICD-10-CM | POA: Diagnosis not present

## 2018-06-21 DIAGNOSIS — D1800 Hemangioma unspecified site: Secondary | ICD-10-CM | POA: Diagnosis not present

## 2018-06-21 DIAGNOSIS — D225 Melanocytic nevi of trunk: Secondary | ICD-10-CM | POA: Diagnosis not present

## 2018-06-22 ENCOUNTER — Encounter: Payer: Self-pay | Admitting: Internal Medicine

## 2018-06-22 ENCOUNTER — Ambulatory Visit (INDEPENDENT_AMBULATORY_CARE_PROVIDER_SITE_OTHER): Payer: Medicare Other | Admitting: Internal Medicine

## 2018-06-22 VITALS — BP 138/88 | HR 77 | Temp 98.2°F | Ht 66.0 in | Wt 250.0 lb

## 2018-06-22 DIAGNOSIS — I10 Essential (primary) hypertension: Secondary | ICD-10-CM

## 2018-06-22 DIAGNOSIS — G4733 Obstructive sleep apnea (adult) (pediatric): Secondary | ICD-10-CM | POA: Diagnosis not present

## 2018-06-22 DIAGNOSIS — N183 Chronic kidney disease, stage 3 unspecified: Secondary | ICD-10-CM

## 2018-06-22 DIAGNOSIS — C84A0 Cutaneous T-cell lymphoma, unspecified, unspecified site: Secondary | ICD-10-CM | POA: Diagnosis not present

## 2018-06-22 DIAGNOSIS — E1121 Type 2 diabetes mellitus with diabetic nephropathy: Secondary | ICD-10-CM | POA: Insufficient documentation

## 2018-06-22 LAB — POCT GLYCOSYLATED HEMOGLOBIN (HGB A1C): Hemoglobin A1C: 6.7 % — AB (ref 4.0–5.6)

## 2018-06-22 NOTE — Assessment & Plan Note (Signed)
Uses the BiPap every night

## 2018-06-22 NOTE — Assessment & Plan Note (Signed)
On Rx from dermatologist

## 2018-06-22 NOTE — Progress Notes (Signed)
Subjective:    Patient ID: Daniel Kline, male    DOB: 08/30/1945, 72 y.o.   MRN: [id number]  HPI Here for transfer of care from Dr Derrel Nip He didn't feel comfortable  Long standing diabetes-- ~5 years Control has been reasonable Known CKD 3 Checks sugars twice a day at times Typically in low 100's if he works out regularly  Seen by cardiologist HTN--now better control Also on statin  Degenerative arthritis Has had both hips replaced Now needs right TKR  OSA---sleeps with BiPap Very large neck Played college football at [employer] (but didn't graduate)  Sees Dr Olson---dermatologist at [place] fungoides diagnosis Gets injections of interferon subcutaneous (wife gives him) --the UV light box Sees Dr Nehemiah Massed locally  Hydrocodone for chronic pain Gets from primary care  Current Outpatient Medications on File Prior to Visit  Medication Sig Dispense Refill  . amLODipine (NORVASC) 10 MG tablet TAKE 1 TABLET BY MOUTH  DAILY 90 tablet 1  . carvedilol (COREG) 6.25 MG tablet TAKE 1 TABLET BY MOUTH 2  TIMES DAILY WITH A MEAL 180 tablet 3  . Cholecalciferol (VITAMIN D-3) 1000 units CAPS Take 2 capsules by mouth daily. Reported on 02/27/2016    . Cinnamon Bark POWD by Does not apply route.    . clobetasol cream (TEMOVATE) 5.09 % Apply 1 application topically 2 (two) times daily.     . clorazepate (TRANXENE) 7.5 MG tablet Take 1 tablet (7.5 mg total) by mouth 2 (two) times daily as needed for anxiety. 60 tablet 3  . Elastic Bandages & Supports (MEDICAL COMPRESSION STOCKINGS) MISC 2 Units by Does not apply route daily. 2 each 0  . EPINEPHrine 0.3 mg/0.3 mL IJ SOAJ injection Inject into the muscle once.    . furosemide (LASIX) 20 MG tablet TAKE ONE TABLET EVERY MORNING 90 tablet 1  . gabapentin (NEURONTIN) 300 MG capsule TAKE 1 CAPSULE BY MOUTH 3  TIMES DAILY 180 capsule 0  . glipiZIDE (GLUCOTROL) 5 MG tablet Take 0.5 tablets (2.5 mg total) by mouth 2 (two) times daily before a meal.  180 tablet 1  . hydrALAZINE (APRESOLINE) 100 MG tablet Take 1 tablet (100 mg total) by mouth 3 (three) times daily. 90 tablet 3  . HYDROcodone-acetaminophen (NORCO/VICODIN) 5-325 MG tablet Take 1 tablet by mouth 2 (two) times daily as needed for moderate pain. 60 tablet 0  . hydrOXYzine (ATARAX/VISTARIL) 25 MG tablet TAKE ONE TABLET BY MOUTH EVERY 8 HOURS AS NEEDED 90 tablet 1  . INTRON A 6000000 UNIT/ML injection     . JANUVIA 100 MG tablet TAKE 1 TABLET BY MOUTH  DAILY 90 tablet 1  . latanoprost (XALATAN) 0.005 % ophthalmic solution Place 1 drop into the right eye at bedtime. 2.5 mL 2  . losartan (COZAAR) 100 MG tablet Take 1 tablet (100 mg total) by mouth daily. 90 tablet 1  . metFORMIN (GLUCOPHAGE) 500 MG tablet TAKE 1 TABLET BY MOUTH TWO  TIMES DAILY 180 tablet 1  . Misc Natural Products (OSTEO BI-FLEX ADV DOUBLE ST PO) Take by mouth.    . Multiple Vitamins-Minerals (MULTIVITAL) tablet Take 1 tablet by mouth daily.      . ONE TOUCH ULTRA TEST test strip CHECK BLOOD SUGAR 3 TIMES DAILY AS DIRECTED 100 each 1  . rosuvastatin (CRESTOR) 20 MG tablet TAKE 1 TABLET BY MOUTH  DAILY 90 tablet 1   No current facility-administered medications on file prior to visit.     Allergies  Allergen Reactions  .  Bee Venom Anaphylaxis  . Oxycodone Other (See Comments)    Other reaction(s): Delusions (intolerance) Other reaction(s): Delusions (intolerance)   . Hydromorphone Other (See Comments)    hallucinating  . Zolpidem Other (See Comments)    Past Medical History:  Diagnosis Date  . Arthritis   . Diabetes mellitus without complication ([place])   . HLD (hyperlipidemia)   . HTN (hypertension)   . HTN (hypertension)   . MRSA (methicillin resistant Staphylococcus aureus)    after back surgery  . OSA (obstructive sleep apnea)   . Polio   . POLIOMYELITIS 01/12/2010   Qualifier: Diagnosis of  By: Maryjean Ka      Past Surgical History:  Procedure Laterality Date  . BACK SURGERY    .  CARDIAC ELECTROPHYSIOLOGY STUDY AND ABLATION  2019  . right elbow surgery    . right knee surgery    . right shoulder surgery    . spleenectomy    . TOTAL HIP ARTHROPLASTY Bilateral 04/2016    Family History  Adopted: Yes    Social History   Socioeconomic History  . Marital status: Married    Spouse name: Not on file  . Number of children: 0  . Years of education: Not on file  . Highest education level: Not on file  Occupational History  . 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 Occupation: Cytogeneticist    Comment: Retired  . 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 Financial resource strain: Not hard at all  . Food insecurity:    Worry: Never true    Inability: Never true  . Transportation needs:    Medical: No    Non-medical: No  Tobacco Use  . Smoking status: Former Smoker    Last attempt to quit: 09/23/1990    Years since quitting: 27.7  . Smokeless tobacco: Former Systems developer    Quit date: 02/25/2006  . Tobacco comment: tobacco use- no   Substance and Sexual Activity  . Alcohol use: No    Alcohol/week: 0.0 standard drinks  . Drug use: No  . Sexual activity: Yes    Partners: Female  Lifestyle  . Physical activity:    Days per week: Not on file    Minutes per session: Not on file  . Stress: Not on file  Relationships  . Social connections:    Talks on phone: Patient refused    Gets together: Patient refused    Attends religious service: Patient refused    Active member of club or organization: Patient refused    Attends meetings of clubs or organizations: Patient refused    Relationship status: Patient refused  . Intimate partner violence:    Fear of current or ex partner: No    Emotionally abused: No    Physically abused: No    Forced sexual activity: No  Other Topics Concern  . Not on file  Social History Narrative   Has living will   Wife is health care POA---then  brother or sister   Would accept resuscitation attempts but no prolonged ventilation or tube feeds   Review of Systems Has dropped off on exercise--weight up a few pounds Sleeps okay with machine Bowels are fine Voids okay. Stream is generally okay Tends to have edema---wears compression hose Sees podiatrist--[place]    Objective:   Physical Exam  Constitutional: He appears well-developed. No distress.  Neck: No thyromegaly present.  Cardiovascular: Normal  rate, regular rhythm and normal heart sounds. Exam reveals no gallop.  No murmur heard. Respiratory: Effort normal and breath sounds normal. No respiratory distress. He has no wheezes. He has no rales.  Musculoskeletal: He exhibits no edema.  Atrophy of right arm  Lymphadenopathy:    He has no cervical adenopathy.  Skin:     Psychiatric: He has a normal mood and affect. His behavior is normal.           Assessment & Plan:

## 2018-06-22 NOTE — Assessment & Plan Note (Signed)
BP Readings from Last 3 Encounters:  06/22/18 138/88  06/09/18 (!) 148/70  04/26/18 (!) 168/90   Better today No change needed

## 2018-06-22 NOTE — Assessment & Plan Note (Addendum)
On losartan BP is good

## 2018-06-22 NOTE — Assessment & Plan Note (Addendum)
Control may have slipped some with his weight gain Will check A1c  Lab Results  Component Value Date   HGBA1C 6.7 (A) 06/22/2018   Control actually better No med changes needed

## 2018-06-24 DIAGNOSIS — G4733 Obstructive sleep apnea (adult) (pediatric): Secondary | ICD-10-CM | POA: Diagnosis not present

## 2018-06-27 ENCOUNTER — Other Ambulatory Visit: Payer: Self-pay

## 2018-06-27 MED ORDER — GLIPIZIDE 5 MG PO TABS: 2.5000 mg | ORAL_TABLET | Freq: Two times a day (BID) | ORAL | 3 refills | Status: DC

## 2018-07-11 ENCOUNTER — Ambulatory Visit: Payer: Self-pay | Admitting: Internal Medicine

## 2018-07-11 DIAGNOSIS — D485 Neoplasm of uncertain behavior of skin: Secondary | ICD-10-CM | POA: Diagnosis not present

## 2018-07-11 DIAGNOSIS — L988 Other specified disorders of the skin and subcutaneous tissue: Secondary | ICD-10-CM | POA: Diagnosis not present

## 2018-07-17 ENCOUNTER — Other Ambulatory Visit: Payer: Self-pay

## 2018-07-18 ENCOUNTER — Other Ambulatory Visit: Payer: Self-pay | Admitting: Internal Medicine

## 2018-07-18 DIAGNOSIS — Z79899 Other long term (current) drug therapy: Secondary | ICD-10-CM | POA: Diagnosis not present

## 2018-07-18 DIAGNOSIS — D225 Melanocytic nevi of trunk: Secondary | ICD-10-CM | POA: Diagnosis not present

## 2018-07-18 DIAGNOSIS — C84A0 Cutaneous T-cell lymphoma, unspecified, unspecified site: Secondary | ICD-10-CM | POA: Diagnosis not present

## 2018-07-21 ENCOUNTER — Ambulatory Visit: Payer: Self-pay

## 2018-07-24 ENCOUNTER — Telehealth: Payer: Self-pay | Admitting: Internal Medicine

## 2018-07-24 ENCOUNTER — Other Ambulatory Visit: Payer: Self-pay | Admitting: Internal Medicine

## 2018-07-24 DIAGNOSIS — Z1211 Encounter for screening for malignant neoplasm of colon: Secondary | ICD-10-CM

## 2018-07-24 DIAGNOSIS — G4733 Obstructive sleep apnea (adult) (pediatric): Secondary | ICD-10-CM | POA: Diagnosis not present

## 2018-07-24 NOTE — Telephone Encounter (Signed)
°  Need referral for colonoscopy

## 2018-07-24 NOTE — Telephone Encounter (Signed)
Pt called office requesting to have a colonoscopy scheduled. Pt prefers [facility] in [place].

## 2018-07-25 DIAGNOSIS — C84A0 Cutaneous T-cell lymphoma, unspecified, unspecified site: Secondary | ICD-10-CM | POA: Diagnosis not present

## 2018-07-25 NOTE — Addendum Note (Signed)
Addended by: [name] on: 07/25/2018 11:35 AM   Modules accepted: Orders

## 2018-07-25 NOTE — Addendum Note (Signed)
Addended by: [name] on: 07/25/2018 11:40 AM   Modules accepted: Orders

## 2018-07-25 NOTE — Telephone Encounter (Signed)
Created referral

## 2018-07-27 DIAGNOSIS — R809 Proteinuria, unspecified: Secondary | ICD-10-CM | POA: Diagnosis not present

## 2018-07-27 DIAGNOSIS — D631 Anemia in chronic kidney disease: Secondary | ICD-10-CM | POA: Diagnosis not present

## 2018-07-27 DIAGNOSIS — I129 Hypertensive chronic kidney disease with stage 1 through stage 4 chronic kidney disease, or unspecified chronic kidney disease: Secondary | ICD-10-CM | POA: Diagnosis not present

## 2018-07-27 DIAGNOSIS — E1122 Type 2 diabetes mellitus with diabetic chronic kidney disease: Secondary | ICD-10-CM | POA: Diagnosis not present

## 2018-07-27 DIAGNOSIS — N183 Chronic kidney disease, stage 3 (moderate): Secondary | ICD-10-CM | POA: Diagnosis not present

## 2018-08-04 DIAGNOSIS — J069 Acute upper respiratory infection, unspecified: Secondary | ICD-10-CM | POA: Diagnosis not present

## 2018-08-18 DIAGNOSIS — Z79899 Other long term (current) drug therapy: Secondary | ICD-10-CM | POA: Diagnosis not present

## 2018-08-18 DIAGNOSIS — C84A0 Cutaneous T-cell lymphoma, unspecified, unspecified site: Secondary | ICD-10-CM | POA: Diagnosis not present

## 2018-08-21 ENCOUNTER — Other Ambulatory Visit: Payer: Self-pay | Admitting: Internal Medicine

## 2018-08-21 DIAGNOSIS — I10 Essential (primary) hypertension: Secondary | ICD-10-CM | POA: Diagnosis not present

## 2018-08-21 DIAGNOSIS — E1129 Type 2 diabetes mellitus with other diabetic kidney complication: Secondary | ICD-10-CM | POA: Diagnosis not present

## 2018-08-21 DIAGNOSIS — N183 Chronic kidney disease, stage 3 (moderate): Secondary | ICD-10-CM | POA: Diagnosis not present

## 2018-08-21 DIAGNOSIS — R809 Proteinuria, unspecified: Secondary | ICD-10-CM | POA: Diagnosis not present

## 2018-08-21 DIAGNOSIS — N2889 Other specified disorders of kidney and ureter: Secondary | ICD-10-CM | POA: Diagnosis not present

## 2018-08-21 DIAGNOSIS — B351 Tinea unguium: Secondary | ICD-10-CM | POA: Diagnosis not present

## 2018-08-21 DIAGNOSIS — L851 Acquired keratosis [keratoderma] palmaris et plantaris: Secondary | ICD-10-CM | POA: Diagnosis not present

## 2018-08-21 DIAGNOSIS — E1142 Type 2 diabetes mellitus with diabetic polyneuropathy: Secondary | ICD-10-CM | POA: Diagnosis not present

## 2018-08-23 ENCOUNTER — Other Ambulatory Visit: Payer: Self-pay | Admitting: Internal Medicine

## 2018-08-24 DIAGNOSIS — G4733 Obstructive sleep apnea (adult) (pediatric): Secondary | ICD-10-CM | POA: Diagnosis not present

## 2018-08-29 DIAGNOSIS — N183 Chronic kidney disease, stage 3 (moderate): Secondary | ICD-10-CM | POA: Diagnosis not present

## 2018-08-29 DIAGNOSIS — C84A0 Cutaneous T-cell lymphoma, unspecified, unspecified site: Secondary | ICD-10-CM | POA: Diagnosis not present

## 2018-08-29 DIAGNOSIS — D649 Anemia, unspecified: Secondary | ICD-10-CM | POA: Diagnosis not present

## 2018-08-29 DIAGNOSIS — Z8371 Family history of colonic polyps: Secondary | ICD-10-CM | POA: Diagnosis not present

## 2018-08-29 DIAGNOSIS — E118 Type 2 diabetes mellitus with unspecified complications: Secondary | ICD-10-CM | POA: Diagnosis not present

## 2018-09-04 ENCOUNTER — Telehealth: Payer: Self-pay

## 2018-09-04 NOTE — Telephone Encounter (Signed)
Mrs Clarin [facility] signed) pt has numbness in lt thumb, index, and middle finger, numbness started 3 -4 wks ago and when lays arm down the numbness goes away.pt said the 3 fingers hurt pain level of 6 when they are numb and tingling. No known injury. Numbness is worse at night. Pt does not know the cause; pt likes to work out at gym 3 -4 times a wk but is not sure if using weights might be causing. No repetative movements. No numbness in arms or legs; no weakness in arms or legs but has slight trouble gripping when fingers are numb. Pt only wants to see Dr Silvio Pate. Pt scheduled appt on 09/06/18 at 9:45 with Dr Silvio Pate but advised pt if condition changes or worsens prior to appt to cb and can schedule with another provider at [facility]. Pt voiced understanding. FYI to Dr Silvio Pate.

## 2018-09-04 NOTE — Telephone Encounter (Signed)
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

## 2018-09-06 ENCOUNTER — Ambulatory Visit (INDEPENDENT_AMBULATORY_CARE_PROVIDER_SITE_OTHER): Payer: Medicare Other | Admitting: Internal Medicine

## 2018-09-06 ENCOUNTER — Encounter: Payer: Self-pay | Admitting: Internal Medicine

## 2018-09-06 VITALS — BP 110/78 | HR 58 | Temp 97.5°F | Ht 66.0 in | Wt 252.0 lb

## 2018-09-06 DIAGNOSIS — G5632 Lesion of radial nerve, left upper limb: Secondary | ICD-10-CM

## 2018-09-06 MED ORDER — HYDROCODONE-ACETAMINOPHEN 5-325 MG PO TABS: 1.0000 | ORAL_TABLET | Freq: Two times a day (BID) | ORAL | 0 refills | Status: DC | PRN

## 2018-09-06 NOTE — Assessment & Plan Note (Signed)
Probably related to the hand grip or rolling exercises he does in the gym If progresses, would consider neurology evaluation

## 2018-09-06 NOTE — Progress Notes (Signed)
Subjective:    Patient ID: Daniel Kline, male    DOB: May 17, 1946, 73 y.o.   MRN: [id number]  HPI Here due to numbness in fingers of left hand  Started about a month ago Tries to ignore things when he can, but noticed less sensation in 1st, 2nd and 3rd fingers over the past 3-4 weeks Uses hand grips regularly--wonders if that is causing the problem (high tension) No weakness  No progression Notices more tingling if hand is up in the air  Current Outpatient Medications on File Prior to Visit  Medication Sig Dispense Refill  . amLODipine (NORVASC) 10 MG tablet TAKE 1 TABLET BY MOUTH  DAILY 90 tablet 1  . carvedilol (COREG) 6.25 MG tablet TAKE 1 TABLET BY MOUTH 2  TIMES DAILY WITH A MEAL 180 tablet 3  . Cholecalciferol (VITAMIN D-3) 1000 units CAPS Take 2 capsules by mouth daily. Reported on 02/27/2016    . Cinnamon Bark POWD by Does not apply route.    . clobetasol cream (TEMOVATE) 1.88 % Apply 1 application topically 2 (two) times daily.     . clorazepate (TRANXENE) 7.5 MG tablet Take 1 tablet (7.5 mg total) by mouth 2 (two) times daily as needed for anxiety. 60 tablet 3  . Elastic Bandages & Supports (MEDICAL COMPRESSION STOCKINGS) MISC 2 Units by Does not apply route daily. 2 each 0  . EPINEPHrine 0.3 mg/0.3 mL IJ SOAJ injection Inject into the muscle once.    . furosemide (LASIX) 20 MG tablet TAKE ONE TABLET EVERY MORNING 90 tablet 1  . gabapentin (NEURONTIN) 300 MG capsule TAKE 1 CAPSULE BY MOUTH 3  TIMES DAILY 180 capsule 0  . glipiZIDE (GLUCOTROL) 5 MG tablet Take 0.5 tablets (2.5 mg total) by mouth 2 (two) times daily before a meal. 90 tablet 3  . hydrALAZINE (APRESOLINE) 100 MG tablet Take 1 tablet (100 mg total) by mouth 3 (three) times daily. 90 tablet 3  . HYDROcodone-acetaminophen (NORCO/VICODIN) 5-325 MG tablet Take 1 tablet by mouth 2 (two) times daily as needed for moderate pain. 60 tablet 0  . hydrOXYzine (ATARAX/VISTARIL) 25 MG tablet TAKE ONE TABLET BY MOUTH EVERY 8  HOURS AS NEEDED 90 tablet 1  . INTRON A 6000000 UNIT/ML injection     . JANUVIA 100 MG tablet TAKE 1 TABLET BY MOUTH  DAILY 90 tablet 1  . latanoprost (XALATAN) 0.005 % ophthalmic solution Place 1 drop into the right eye at bedtime. 2.5 mL 2  . losartan (COZAAR) 100 MG tablet Take 1 tablet (100 mg total) by mouth daily. 90 tablet 1  . metFORMIN (GLUCOPHAGE) 500 MG tablet TAKE 1 TABLET BY MOUTH TWO  TIMES DAILY 180 tablet 1  . Misc Natural Products (OSTEO BI-FLEX ADV DOUBLE ST PO) Take by mouth.    . Multiple Vitamins-Minerals (MULTIVITAL) tablet Take 1 tablet by mouth daily.      . ONE TOUCH ULTRA TEST test strip CHECK BLOOD SUGAR 3 TIMES DAILY AS DIRECTED 100 each 1  . rosuvastatin (CRESTOR) 20 MG tablet TAKE ONE TABLET EVERY DAY 90 tablet 1  . isosorbide mononitrate (IMDUR) 60 MG 24 hr tablet Take 1 tablet by mouth daily.     No current facility-administered medications on file prior to visit.     Allergies  Allergen Reactions  . Bee Venom Anaphylaxis  . Oxycodone Other (See Comments)    Other reaction(s): Delusions (intolerance) Other reaction(s): Delusions (intolerance)   . Hydromorphone Other (See Comments)    hallucinating  .  Zolpidem Other (See Comments)    Past Medical History:  Diagnosis Date  . Arthritis   . Diabetes mellitus without complication ([place])   . HLD (hyperlipidemia)   . HTN (hypertension)   . HTN (hypertension)   . MRSA (methicillin resistant Staphylococcus aureus)    after back surgery  . OSA (obstructive sleep apnea)   . Polio   . POLIOMYELITIS 01/12/2010   Right arm affected    Past Surgical History:  Procedure Laterality Date  . BACK SURGERY    . CARDIAC ELECTROPHYSIOLOGY STUDY AND ABLATION  2019  . right elbow surgery    . right knee surgery    . right shoulder surgery     from polio damage  . spleenectomy    . TOTAL HIP ARTHROPLASTY Bilateral 04/2016    Family History  Adopted: Yes    Social History   Socioeconomic History  .  Marital status: Married    Spouse name: Not on file  . Number of children: 0  . Years of education: Not on file  . Highest education level: Not on file  Occupational History  . 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 Occupation: Cytogeneticist    Comment: Retired  . 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 Financial resource strain: Not hard at all  . Food insecurity:    Worry: Never true    Inability: Never true  . Transportation needs:    Medical: No    Non-medical: No  Tobacco Use  . Smoking status: Former Smoker    Last attempt to quit: 09/23/1990    Years since quitting: 27.9  . Smokeless tobacco: Former Systems developer    Quit date: 02/25/2006  . Tobacco comment: tobacco use- no   Substance and Sexual Activity  . Alcohol use: No    Alcohol/week: 0.0 standard drinks  . Drug use: No  . Sexual activity: Yes    Partners: Female  Lifestyle  . Physical activity:    Days per week: Not on file    Minutes per session: Not on file  . Stress: Not on file  Relationships  . Social connections:    Talks on phone: Patient refused    Gets together: Patient refused    Attends religious service: Patient refused    Active member of club or organization: Patient refused    Attends meetings of clubs or organizations: Patient refused    Relationship status: Patient refused  . Intimate partner violence:    Fear of current or ex partner: No    Emotionally abused: No    Physically abused: No    Forced sexual activity: No  Other Topics Concern  . Not on file  Social History Narrative   Has living will   Wife is health care POA---then brother or sister   Would accept resuscitation attempts but no prolonged ventilation or tube feeds   Review of Systems  No neuropathy in feet Uses the gabapentin for chronic radicular back pain Bad right knee pain---needs refill of hydrocodone    Objective:   Physical Exam    Constitutional: He appears well-developed. No distress.  Musculoskeletal:     Comments: Normal ROM in left shoulder, elbow, hand  Neurological:  Normal strength in hands and grip strength Normal fine touch sensation           Assessment & Plan:

## 2018-09-07 ENCOUNTER — Other Ambulatory Visit: Payer: Self-pay | Admitting: Internal Medicine

## 2018-09-13 ENCOUNTER — Other Ambulatory Visit: Payer: Self-pay | Admitting: Internal Medicine

## 2018-09-14 NOTE — Telephone Encounter (Signed)
Let him know I sent the prescriptions

## 2018-09-18 DIAGNOSIS — Z79899 Other long term (current) drug therapy: Secondary | ICD-10-CM | POA: Diagnosis not present

## 2018-09-18 DIAGNOSIS — C84A0 Cutaneous T-cell lymphoma, unspecified, unspecified site: Secondary | ICD-10-CM | POA: Diagnosis not present

## 2018-09-19 DIAGNOSIS — G4733 Obstructive sleep apnea (adult) (pediatric): Secondary | ICD-10-CM | POA: Diagnosis not present

## 2018-09-20 ENCOUNTER — Telehealth: Payer: Self-pay | Admitting: Internal Medicine

## 2018-09-20 DIAGNOSIS — G5632 Lesion of radial nerve, left upper limb: Secondary | ICD-10-CM

## 2018-09-20 NOTE — Telephone Encounter (Signed)
Patient was seen by Dr.Letvak on 09/06/18 for neuropathy. Dr.Letvak told patient if his hand wasn't doing better, he would refer him to a neurologist.  Patient would like to be referred to a neurologist.  Patient prefers [facility].  Patient can go anytime except Monday or Tuesday next week.

## 2018-09-21 NOTE — Telephone Encounter (Signed)
Spoke to pt

## 2018-09-21 NOTE — Telephone Encounter (Signed)
Please let him know that I have put in the referral. He should hear about this next week---but I expect it to take some time till he can get in

## 2018-09-25 DIAGNOSIS — E113293 Type 2 diabetes mellitus with mild nonproliferative diabetic retinopathy without macular edema, bilateral: Secondary | ICD-10-CM | POA: Diagnosis not present

## 2018-09-25 DIAGNOSIS — H401111 Primary open-angle glaucoma, right eye, mild stage: Secondary | ICD-10-CM | POA: Diagnosis not present

## 2018-09-26 DIAGNOSIS — M1731 Unilateral post-traumatic osteoarthritis, right knee: Secondary | ICD-10-CM | POA: Diagnosis not present

## 2018-09-26 DIAGNOSIS — M25561 Pain in right knee: Secondary | ICD-10-CM | POA: Diagnosis not present

## 2018-10-11 ENCOUNTER — Other Ambulatory Visit: Payer: Self-pay | Admitting: Internal Medicine

## 2018-10-12 ENCOUNTER — Other Ambulatory Visit: Payer: Self-pay | Admitting: Internal Medicine

## 2018-10-12 NOTE — Telephone Encounter (Signed)
Name of Medication: Hydrocodone Name of Pharmacy: [facility] or Written Date and Quantity: 09-06-18 #60 Last Office Visit and Type: 09-06-18 Routine F/U Next Office Visit and Type: 11-02-18 Routine F/U Last Controlled Substance Agreement Date: None Last UDS: None  CSRS was done 09-06-18

## 2018-10-18 DIAGNOSIS — Z79899 Other long term (current) drug therapy: Secondary | ICD-10-CM | POA: Diagnosis not present

## 2018-10-18 DIAGNOSIS — C84A0 Cutaneous T-cell lymphoma, unspecified, unspecified site: Secondary | ICD-10-CM | POA: Diagnosis not present

## 2018-10-22 ENCOUNTER — Other Ambulatory Visit: Payer: Self-pay | Admitting: Internal Medicine

## 2018-10-23 NOTE — Telephone Encounter (Signed)
Please let him know the Rx has been sent

## 2018-10-24 DIAGNOSIS — H401111 Primary open-angle glaucoma, right eye, mild stage: Secondary | ICD-10-CM | POA: Diagnosis not present

## 2018-10-27 ENCOUNTER — Telehealth: Payer: Self-pay | Admitting: Internal Medicine

## 2018-10-27 NOTE — Telephone Encounter (Signed)
I left a detailed message on patient's voice mail. I cancelled appointment for cpx next week and I will call back to r/s appointment.

## 2018-10-28 ENCOUNTER — Other Ambulatory Visit: Payer: Self-pay | Admitting: Internal Medicine

## 2018-11-02 ENCOUNTER — Encounter: Payer: Self-pay | Admitting: Internal Medicine

## 2018-11-03 ENCOUNTER — Encounter: Admission: RE | Payer: Self-pay | Source: Home / Self Care

## 2018-11-03 ENCOUNTER — Ambulatory Visit
Admission: RE | Admit: 2018-11-03 | Payer: Medicare Other | Source: Home / Self Care | Admitting: Unknown Physician Specialty

## 2018-11-03 SURGERY — COLONOSCOPY WITH PROPOFOL
Anesthesia: General

## 2018-11-07 DIAGNOSIS — M1711 Unilateral primary osteoarthritis, right knee: Secondary | ICD-10-CM | POA: Diagnosis not present

## 2018-11-18 ENCOUNTER — Other Ambulatory Visit: Payer: Self-pay | Admitting: Internal Medicine

## 2018-11-20 DIAGNOSIS — L309 Dermatitis, unspecified: Secondary | ICD-10-CM | POA: Diagnosis not present

## 2018-11-20 DIAGNOSIS — E1142 Type 2 diabetes mellitus with diabetic polyneuropathy: Secondary | ICD-10-CM | POA: Diagnosis not present

## 2018-11-20 DIAGNOSIS — L851 Acquired keratosis [keratoderma] palmaris et plantaris: Secondary | ICD-10-CM | POA: Diagnosis not present

## 2018-11-20 DIAGNOSIS — B351 Tinea unguium: Secondary | ICD-10-CM | POA: Diagnosis not present

## 2018-11-20 NOTE — Telephone Encounter (Signed)
Letvak pt, out of the office  Name of Medication: Hydrocodone Name of Pharmacy: [facility] or Written Date and Quantity: 10/12/18 #60 Last Office Visit and Type: 09-06-18 Routine F/U Last Controlled Substance Agreement Date: None Last UDS: None  Please advise

## 2018-11-22 ENCOUNTER — Other Ambulatory Visit: Payer: Self-pay | Admitting: Internal Medicine

## 2018-11-27 DIAGNOSIS — R200 Anesthesia of skin: Secondary | ICD-10-CM | POA: Diagnosis not present

## 2018-11-27 DIAGNOSIS — R202 Paresthesia of skin: Secondary | ICD-10-CM | POA: Diagnosis not present

## 2018-12-04 MED ORDER — ROSUVASTATIN CALCIUM 10 MG PO TABS: 10.0000 mg | ORAL_TABLET | Freq: Every day | ORAL | 0 refills | Status: DC

## 2018-12-04 NOTE — Addendum Note (Signed)
Addended by: Lurlean Nanny on: 12/04/2018 09:42 AM   Modules accepted: Orders

## 2018-12-05 DIAGNOSIS — C84A0 Cutaneous T-cell lymphoma, unspecified, unspecified site: Secondary | ICD-10-CM | POA: Diagnosis not present

## 2018-12-14 ENCOUNTER — Other Ambulatory Visit: Payer: Self-pay | Admitting: Internal Medicine

## 2018-12-14 DIAGNOSIS — I10 Essential (primary) hypertension: Secondary | ICD-10-CM

## 2018-12-21 ENCOUNTER — Other Ambulatory Visit: Payer: Self-pay | Admitting: Internal Medicine

## 2018-12-21 DIAGNOSIS — Z1283 Encounter for screening for malignant neoplasm of skin: Secondary | ICD-10-CM | POA: Diagnosis not present

## 2018-12-21 DIAGNOSIS — D1800 Hemangioma unspecified site: Secondary | ICD-10-CM | POA: Diagnosis not present

## 2018-12-21 DIAGNOSIS — L578 Other skin changes due to chronic exposure to nonionizing radiation: Secondary | ICD-10-CM | POA: Diagnosis not present

## 2018-12-21 DIAGNOSIS — L918 Other hypertrophic disorders of the skin: Secondary | ICD-10-CM | POA: Diagnosis not present

## 2018-12-21 DIAGNOSIS — D229 Melanocytic nevi, unspecified: Secondary | ICD-10-CM | POA: Diagnosis not present

## 2018-12-29 ENCOUNTER — Other Ambulatory Visit: Payer: Self-pay | Admitting: Internal Medicine

## 2018-12-29 NOTE — Telephone Encounter (Signed)
Looks like patient has changed provider ok to refuse?

## 2018-12-30 ENCOUNTER — Other Ambulatory Visit: Payer: Self-pay | Admitting: Internal Medicine

## 2019-01-03 ENCOUNTER — Other Ambulatory Visit: Payer: Self-pay | Admitting: Family Medicine

## 2019-01-04 NOTE — Telephone Encounter (Signed)
Name of Medication: Hydrocodone Name of Pharmacy: [facility] or Written Date and Quantity: 11-20-18 #60 Last Office Visit and Type: 09-06-18 Next Office Visit and Type: No Future OV Last Controlled Substance Agreement Date: N/A Last UDS: N/A

## 2019-01-04 NOTE — Telephone Encounter (Signed)
Please check and document PDMP review

## 2019-01-05 DIAGNOSIS — G4733 Obstructive sleep apnea (adult) (pediatric): Secondary | ICD-10-CM | POA: Diagnosis not present

## 2019-01-05 DIAGNOSIS — K210 Gastro-esophageal reflux disease with esophagitis: Secondary | ICD-10-CM | POA: Diagnosis not present

## 2019-01-05 DIAGNOSIS — H04129 Dry eye syndrome of unspecified lacrimal gland: Secondary | ICD-10-CM | POA: Diagnosis not present

## 2019-01-05 DIAGNOSIS — I4892 Unspecified atrial flutter: Secondary | ICD-10-CM | POA: Diagnosis not present

## 2019-01-05 DIAGNOSIS — Z9989 Dependence on other enabling machines and devices: Secondary | ICD-10-CM | POA: Diagnosis not present

## 2019-01-05 DIAGNOSIS — I10 Essential (primary) hypertension: Secondary | ICD-10-CM | POA: Diagnosis not present

## 2019-01-12 DIAGNOSIS — G4733 Obstructive sleep apnea (adult) (pediatric): Secondary | ICD-10-CM | POA: Diagnosis not present

## 2019-01-16 ENCOUNTER — Other Ambulatory Visit: Payer: Self-pay | Admitting: Internal Medicine

## 2019-01-18 NOTE — Telephone Encounter (Signed)
Okay to refill for a year 

## 2019-01-19 DIAGNOSIS — R200 Anesthesia of skin: Secondary | ICD-10-CM | POA: Diagnosis not present

## 2019-01-19 DIAGNOSIS — R202 Paresthesia of skin: Secondary | ICD-10-CM | POA: Diagnosis not present

## 2019-01-22 ENCOUNTER — Telehealth: Payer: Self-pay

## 2019-01-22 ENCOUNTER — Telehealth: Payer: Self-pay | Admitting: Cardiovascular Disease

## 2019-01-22 NOTE — Telephone Encounter (Signed)
° °  [facility] Pre-operative Risk Assessment    Request for surgical clearance:  1. What type of surgery is being performed? Right Total Knee Arthroplasty   2. When is this surgery scheduled? TBD   3. What type of clearance is required (medical clearance vs. Pharmacy clearance to hold med vs. Both)? Cardiac clearance, h/o cardiac ablation 2018 at [facility], - atrial flutter   4. Are there any medications that need to be held prior to surgery and how long? None listed - instructions on blood thinners if any   5. Practice name and name of physician performing surgery? [facility], Dr Skip Estimable   6. What is your office phone number [phone]    7.   What is your office fax number [phone]  8.   Anesthesia type (None, local, MAC, general) ? None listed    Ace Gins 01/22/2019, 4:49 PM  _________________________________________________________________   (provider comments below)

## 2019-01-22 NOTE — Telephone Encounter (Signed)
Virtual Visit Pre-Appointment Phone Call  "Daniel Kline, I am calling you today to discuss your upcoming appointment. We are currently trying to limit exposure to the virus that causes COVID-19 by seeing patients at home rather than in the office."  1. "What is the BEST phone number to call the day of the visit?" - include this in appointment notes  2. Do you have or have access to (through a family member/friend) a smartphone with video capability that we can use for your visit?" a. If yes - list this number in appt notes as cell (if different from BEST phone #) and list the appointment type as a VIDEO visit in appointment notes b. If no - list the appointment type as a PHONE visit in appointment notes  3. Confirm consent - "In the setting of the current Covid19 crisis, you are scheduled for a phone visit with your provider on 02/06/2019 at 2:30PM.  Just as we do with many in-office visits, in order for you to participate in this visit, we must obtain consent.  If you'd like, I can send this to your mychart (if signed up) or email for you to review.  Otherwise, I can obtain your verbal consent now.  All virtual visits are billed to your insurance company just like a normal visit would be.  By agreeing to a virtual visit, we'd like you to understand that the technology does not allow for your provider to perform an examination, and thus may limit your provider's ability to fully assess your condition. If your provider identifies any concerns that need to be evaluated in person, we will make arrangements to do so.  Finally, though the technology is pretty good, we cannot assure that it will always work on either your or our end, and in the setting of a video visit, we may have to convert it to a phone-only visit.  In either situation, we cannot ensure that we have a secure connection.  Are you willing to proceed?" STAFF: Did the patient verbally acknowledge consent to telehealth visit? Document YES/NO  here: YES  4. Advise patient to be prepared - "Two hours prior to your appointment, go ahead and check your blood pressure, pulse, oxygen saturation, and your weight (if you have the equipment to check those) and write them all down. When your visit starts, your provider will ask you for this information. If you have an Apple Watch or Kardia device, please plan to have heart rate information ready on the day of your appointment. Please have a pen and paper handy nearby the day of the visit as well."  5. Give patient instructions for MyChart download to smartphone OR Doximity/Doxy.me as below if video visit (depending on what platform provider is using)  6. Inform patient they will receive a phone call 15 minutes prior to their appointment time (may be from unknown caller ID) so they should be prepared to answer    TELEPHONE CALL NOTE  LULA MICHAUX has been deemed a candidate for a follow-up tele-health visit to limit community exposure during the Covid-19 pandemic. I spoke with the patient via phone to ensure availability of phone/video source, confirm preferred email & phone number, and discuss instructions and expectations.  I reminded CAVAN BEARDEN to be prepared with any vital sign and/or heart rhythm information that could potentially be obtained via home monitoring, at the time of his visit. I reminded TRASK VOSLER to expect a phone call prior to his visit.  Rene Paci McClain 01/22/2019 4:00 PM    FULL LENGTH CONSENT FOR TELE-HEALTH VISIT   I hereby voluntarily request, consent and authorize [facility] and its employed or contracted physicians, physician assistants, nurse practitioners or other licensed health care professionals (the Practitioner), to provide me with telemedicine health care services (the Services") as deemed necessary by the treating Practitioner. I acknowledge and consent to receive the Services by the Practitioner via telemedicine. I understand  that the telemedicine visit will involve communicating with the Practitioner through live audiovisual communication technology and the disclosure of certain medical information by electronic transmission. I acknowledge that I have been given the opportunity to request an in-person assessment or other available alternative prior to the telemedicine visit and am voluntarily participating in the telemedicine visit.  I understand that I have the right to withhold or withdraw my consent to the use of telemedicine in the course of my care at any time, without affecting my right to future care or treatment, and that the Practitioner or I may terminate the telemedicine visit at any time. I understand that I have the right to inspect all information obtained and/or recorded in the course of the telemedicine visit and may receive copies of available information for a reasonable fee.  I understand that some of the potential risks of receiving the Services via telemedicine include:   Delay or interruption in medical evaluation due to technological equipment failure or disruption;  Information transmitted may not be sufficient (e.g. poor resolution of images) to allow for appropriate medical decision making by the Practitioner; and/or   In rare instances, security protocols could fail, causing a breach of personal health information.  Furthermore, I acknowledge that it is my responsibility to provide information about my medical history, conditions and care that is complete and accurate to the best of my ability. I acknowledge that Practitioner's advice, recommendations, and/or decision may be based on factors not within their control, such as incomplete or inaccurate data provided by me or distortions of diagnostic images or specimens that may result from electronic transmissions. I understand that the practice of medicine is not an exact science and that Practitioner makes no warranties or guarantees regarding  treatment outcomes. I acknowledge that I will receive a copy of this consent concurrently upon execution via email to the email address I last provided but may also request a printed copy by calling the office of [place].    I understand that my insurance will be billed for this visit.   I have read or had this consent read to me.  I understand the contents of this consent, which adequately explains the benefits and risks of the Services being provided via telemedicine.   I have been provided ample opportunity to ask questions regarding this consent and the Services and have had my questions answered to my satisfaction.  I give my informed consent for the services to be provided through the use of telemedicine in my medical care  By participating in this telemedicine visit I agree to the above.

## 2019-01-23 NOTE — Telephone Encounter (Signed)
   Primary Cardiologist: Ida Rogue, MD  Chart reviewed as part of pre-operative protocol coverage. 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 Not on long term anticoagulation per note. Incidentally found to be in atrial flutter when presented for knee replacement.   Will review surgical clearance during virtual visit next week.  [place], [place] 01/23/2019, 9:48 AM

## 2019-01-25 NOTE — Telephone Encounter (Signed)
Rescheduled with [facility] 6/29

## 2019-01-30 ENCOUNTER — Other Ambulatory Visit: Payer: Self-pay | Admitting: Internal Medicine

## 2019-01-31 ENCOUNTER — Telehealth: Payer: Self-pay

## 2019-01-31 NOTE — Telephone Encounter (Signed)

## 2019-02-02 ENCOUNTER — Telehealth: Payer: Self-pay | Admitting: Cardiovascular Disease

## 2019-02-02 NOTE — Telephone Encounter (Signed)

## 2019-02-02 NOTE — Discharge Instructions (Signed)
°  Instructions after Total Knee Replacement ° ° Myosha Cuadras P. Zared Knoth, Jr., M.D.    ° Dept. of Orthopaedics & Sports Medicine ° [facility] ° [address] ° [place], [place]  [address] ° Phone: [phone]   Fax: [phone] ° °  °DIET: °• Drink plenty of non-alcoholic fluids. °• Resume your normal diet. Include foods high in fiber. ° °ACTIVITY:  °• You may use crutches or a walker with weight-bearing as tolerated, unless instructed otherwise. °• You may be weaned off of the walker or crutches by your Physical Therapist.  °• Do NOT place pillows under the knee. Anything placed under the knee could limit your ability to straighten the knee.   °• Continue doing gentle exercises. Exercising will reduce the pain and swelling, increase motion, and prevent muscle weakness.   °• Please continue to use the TED compression stockings for 6 weeks. You may remove the stockings at night, but should reapply them in the morning. °• Do not drive or operate any equipment until instructed. ° °WOUND CARE:  °• Continue to use the PolarCare or ice packs periodically to reduce pain and swelling. °• You may bathe or shower after the staples are removed at the first office visit following surgery. ° °MEDICATIONS: °• You may resume your regular medications. °• Please take the pain medication as prescribed on the medication. °• Do not take pain medication on an empty stomach. °• You have been given a prescription for a blood thinner (Lovenox or Coumadin). Please take the medication as instructed. (NOTE: After completing a 2 week course of Lovenox, take one Enteric-coated aspirin once a day. This along with elevation will help reduce the possibility of phlebitis in your operated leg.) °• Do not drive or drink alcoholic beverages when taking pain medications. ° °CALL THE OFFICE FOR: °• Temperature above 101 degrees °• Excessive bleeding or drainage on the dressing. °• Excessive swelling, coldness, or paleness of the toes. °• Persistent  nausea and vomiting. ° °FOLLOW-UP:  °• You should have an appointment to return to the office in 10-14 days after surgery. °• Arrangements have been made for continuation of Physical Therapy (either home therapy or outpatient therapy). °  °

## 2019-02-03 NOTE — Progress Notes (Signed)
Cardiology Office Note  Date:  02/05/2019   ID:  [name], [name] [birth year], MRN [id number]  PCP:  [name], MD   Chief Complaint  Patient presents with  . Other    Past due 6 month follow up. Patient needs Cardiac Clearance.  Patient denies chest pain and SOB. Meds reviewed verbally with patient.     HPI:  [name] is a 73 year old gentleman with past medical history of Diabetes Chronic pain Obstructive sleep apnea, uses CPAP F/u for his  Hypertension, atrial flutter s/p ablation 04/2018 at [facility].  Incidentally found to be in atrial flutter when presented for knee replacement.  Reports he had successful ablation We have requested these records  Needs surgical clearance , Right Total Knee Arthroplasty  Can't ride his bike past month secondary to worsening knee pain  Losing weight Trying to change his diet  Ran out of his hydralazine past month Does not check his blood pressure at home  In general has had good exercise tolerance  Blood pressure medications include  amlodipine, carvedilol,  losartan, Lasix   Lab work reviewed with him CR  1.5 HBA1C 6.7 down from 7.5 Total chol 95, LDL 33  EKG personally reviewed by myself on todays visit Shows normal sinus rhythm rate 64 bpm no significant ST or T wave changes   PMH:   has a past medical history of Arthritis, 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  PSH:    Past Surgical History:  Procedure Laterality Date  . BACK SURGERY    . CARDIAC ELECTROPHYSIOLOGY STUDY AND ABLATION  2019  . right elbow surgery    . right knee surgery    . right shoulder surgery     from polio damage  .  spleenectomy    . TOTAL HIP ARTHROPLASTY Bilateral 04/2016    Current Outpatient Medications  Medication Sig Dispense Refill  . amLODipine (NORVASC) 10 MG tablet TAKE 1 TABLET BY MOUTH  DAILY (Patient taking differently: Take 10 mg by mouth daily. ) 90 tablet 3  . Apoaequorin (PREVAGEN PO) Take 1 tablet by mouth daily.    . carvedilol (COREG) 6.25 MG tablet TAKE 1 TABLET BY MOUTH 2  TIMES DAILY WITH A MEAL 180 tablet 3  . Cholecalciferol (VITAMIN D-3 PO) Take 10,000 Units by mouth daily.     Verneita Griffes Bark POWD Take 500 mg by mouth daily.     . clobetasol cream (TEMOVATE) 6.30 % Apply 1 application topically 2 (two) times daily as needed (irritation).     . clorazepate (TRANXENE) 7.5 MG tablet TAKE 1 TABLET (7.5 MG TOTAL) BY MOUTH 2 (TWO) TIMES DAILY AS NEEDED FOR ANXIETY. 60 tablet 0  . Elastic Bandages & Supports (MEDICAL COMPRESSION STOCKINGS) MISC 2 Units by Does not apply route daily. 2 each 0  . EPINEPHrine 0.3 mg/0.3 mL IJ SOAJ injection Inject 0.3 mg into the muscle as needed for anaphylaxis.     . furosemide (LASIX) 20 MG tablet TAKE ONE TABLET EVERY MORNING 90 tablet 1  . gabapentin (NEURONTIN) 300 MG capsule TAKE 1 CAPSULE BY MOUTH 3  TIMES DAILY (Patient taking differently: Take 300 mg by mouth 3 (three) times daily. ) 180 capsule 0  . glipiZIDE (GLUCOTROL) 5 MG tablet TAKE ONE-HALF TABLET BY  MOUTH 2 TIMES  DAILY BEFORE  A MEAL. (Patient taking differently: Take 2.5 mg by mouth 2 (two) times daily before a meal. ) 90 tablet 3  . hydrALAZINE (APRESOLINE) 100 MG tablet Take 1 tablet (100 mg total) by mouth 3 (three) times daily. 90 tablet 3  . HYDROcodone-acetaminophen (NORCO/VICODIN) 5-325 MG tablet TAKE 1 TABLET BY MOUTH TWICE DAILY AS NEEDED FOR PAIN (Patient taking differently: Take 1 tablet by mouth 2 (two) times daily as needed for moderate pain. ) 60 tablet 0  . hydrOXYzine (ATARAX/VISTARIL) 25 MG tablet TAKE ONE TABLET EVERY EIGHT HOURS AS NEEDED (Patient taking differently:  Take 25 mg by mouth every 8 (eight) hours as needed for anxiety. ) 90 tablet 1  . isosorbide mononitrate (IMDUR) 60 MG 24 hr tablet Take 60 mg by mouth daily.     Marland Kitchen JANUVIA 100 MG tablet TAKE 1 TABLET BY MOUTH  DAILY (Patient taking differently: Take 100 mg by mouth daily. ) 90 tablet 3  . metFORMIN (GLUCOPHAGE) 500 MG tablet TAKE 1 TABLET BY MOUTH TWO  TIMES DAILY (Patient taking differently: Take 500 mg by mouth 2 (two) times daily with a meal. ) 180 tablet 3  . Multiple Vitamins-Minerals (MULTIVITAL) tablet Take 1 tablet by mouth daily.      . ONE TOUCH ULTRA TEST test strip CHECK BLOOD SUGAR 3 TIMES DAILY AS DIRECTED 100 each 1  . rosuvastatin (CRESTOR) 10 MG tablet Take 1 tablet (10 mg total) by mouth daily. 90 tablet 0   No current facility-administered medications for this visit.      Allergies:   Bee venom, Oxycodone, Hydromorphone, and Zolpidem   Social History:  The patient  reports that he quit smoking about 28 years ago. He quit smokeless tobacco use about 12 years ago. He reports that he does not drink alcohol or use drugs.   Family History:   family history is not on file. He was adopted.    Review of Systems: Review of Systems  Constitutional: Negative.   Respiratory: Negative.   Cardiovascular: Negative.   Gastrointestinal: Negative.   Musculoskeletal: Positive for joint pain.  Neurological: Negative.   Psychiatric/Behavioral: Negative.   All other systems reviewed and are negative.   PHYSICAL EXAM: VS:  BP 140/70 (BP Location: Left Arm, Patient Position: Sitting, Cuff Size: Normal)   Pulse 64   Ht 5\' 6"  (1.676 m)   Wt 240 lb (108.9 kg)   BMI 38.74 kg/m  , BMI Body mass index is 38.74 kg/m. GEN: Well nourished, well developed, in no acute distress  HEENT: normal  Neck: no JVD, carotid bruits, or masses Cardiac: RRR; no murmurs, rubs, or gallops,no edema  Respiratory:  clear to auscultation bilaterally, normal work of breathing GI: soft, nontender,  nondistended, + BS MS: no deformity or atrophy  Skin: warm and dry, no rash Neuro:  Strength and sensation are intact Psych: euthymic mood, full affect   Recent Labs: No results found for requested labs within last 8760 hours.    Lipid Panel Lab Results  Component Value Date   CHOL 95 01/09/2018   HDL 27.60 (L) 01/09/2018   LDLCALC 33 01/09/2018   TRIG 175.0 (H) 01/09/2018      Wt Readings from Last 3 Encounters:  02/05/19 240 lb (108.9 kg)  09/06/18 252 lb (114.3 kg)  06/22/18 250 lb (113.4 kg)       ASSESSMENT AND PLAN:  Preop cardiovascular clearance for knee surgery Acceptable risk, no further testing needed No medication changes needed prior  to surgery Would recommend we moderate IV fluids pre-and post surgery  Atrial flutter Reports having ablation 2019, records have been requested Not on anticoagulation  Diabetes mellitus with nephropathy ([place]) - Plan: EKG 12-Lead Stressed importance of low carbohydrate diet Numbers are improved A1c 6.7  CKD (chronic kidney disease), stage II Stable CR 1.4 to 1.6 Stable  Essential hypertension - Plan: EKG 12-Lead We will hold the hydralazine Blood pressure appears improved after 10 pound weight loss He has not been on hydralazine the past month Recommend he check blood pressures at home and call us if it runs high  OSA (obstructive sleep apnea) On CPAP, doing well, compliant  Disposition:   F/U  12 months   Total encounter time more than 25 minutes  Greater than 50% was spent in counseling and coordination of care with the patient    Orders Placed This Encounter  Procedures  . EKG 12-Lead     Signed, Esmond Plants, M.D., Ph.D. 02/05/2019  [place], [place]

## 2019-02-05 ENCOUNTER — Other Ambulatory Visit: Payer: Self-pay

## 2019-02-05 ENCOUNTER — Encounter
Admission: RE | Admit: 2019-02-05 | Discharge: 2019-02-05 | Disposition: A | Discharge status: Home / Self Care | Payer: Medicare Other | Source: Ambulatory Visit | Attending: Orthopedic Surgery | Admitting: Orthopedic Surgery

## 2019-02-05 ENCOUNTER — Ambulatory Visit: Payer: Medicare Other | Admitting: Cardiovascular Disease

## 2019-02-05 VITALS — BP 140/70 | HR 64 | Ht 66.0 in | Wt 240.0 lb

## 2019-02-05 DIAGNOSIS — M25561 Pain in right knee: Secondary | ICD-10-CM | POA: Diagnosis not present

## 2019-02-05 DIAGNOSIS — Z79899 Other long term (current) drug therapy: Secondary | ICD-10-CM | POA: Insufficient documentation

## 2019-02-05 DIAGNOSIS — I483 Typical atrial flutter: Secondary | ICD-10-CM

## 2019-02-05 DIAGNOSIS — G4733 Obstructive sleep apnea (adult) (pediatric): Secondary | ICD-10-CM | POA: Insufficient documentation

## 2019-02-05 DIAGNOSIS — E785 Hyperlipidemia, unspecified: Secondary | ICD-10-CM | POA: Insufficient documentation

## 2019-02-05 DIAGNOSIS — I10 Essential (primary) hypertension: Secondary | ICD-10-CM

## 2019-02-05 DIAGNOSIS — E1121 Type 2 diabetes mellitus with diabetic nephropathy: Secondary | ICD-10-CM

## 2019-02-05 DIAGNOSIS — Z01812 Encounter for preprocedural laboratory examination: Secondary | ICD-10-CM | POA: Diagnosis not present

## 2019-02-05 DIAGNOSIS — N182 Chronic kidney disease, stage 2 (mild): Secondary | ICD-10-CM

## 2019-02-05 DIAGNOSIS — E1122 Type 2 diabetes mellitus with diabetic chronic kidney disease: Secondary | ICD-10-CM | POA: Insufficient documentation

## 2019-02-05 DIAGNOSIS — Z7984 Long term (current) use of oral hypoglycemic drugs: Secondary | ICD-10-CM | POA: Diagnosis not present

## 2019-02-05 DIAGNOSIS — I129 Hypertensive chronic kidney disease with stage 1 through stage 4 chronic kidney disease, or unspecified chronic kidney disease: Secondary | ICD-10-CM | POA: Diagnosis not present

## 2019-02-05 HISTORY — DX: Cardiac arrhythmia, unspecified: I49.9

## 2019-02-05 HISTORY — DX: Anemia, unspecified: D64.9

## 2019-02-05 HISTORY — DX: Chronic kidney disease, unspecified: N18.9

## 2019-02-05 HISTORY — DX: Other complications of anesthesia, initial encounter: T88.59XA

## 2019-02-05 HISTORY — DX: Family history of other specified conditions: Z84.89

## 2019-02-05 HISTORY — DX: Nausea with vomiting, unspecified: R11.2

## 2019-02-05 HISTORY — DX: Anxiety disorder, unspecified: F41.9

## 2019-02-05 HISTORY — DX: Other specified postprocedural states: Z98.890

## 2019-02-05 LAB — APTT: aPTT: 50 s — ABNORMAL HIGH (ref 24–36)

## 2019-02-05 LAB — COMPREHENSIVE METABOLIC PANEL
ALT: 20 U/L (ref 0–44)
AST: 18 U/L (ref 15–41)
Albumin: 4 g/dL (ref 3.5–5.0)
Alkaline Phosphatase: 68 U/L (ref 38–126)
Anion gap: 8 (ref 5–15)
BUN: 25 mg/dL — ABNORMAL HIGH (ref 8–23)
CO2: 27 mmol/L (ref 22–32)
Calcium: 9.3 mg/dL (ref 8.9–10.3)
Chloride: 103 mmol/L (ref 98–111)
Creatinine, Ser: 1.56 mg/dL — ABNORMAL HIGH (ref 0.61–1.24)
GFR calc Af Amer: 50 mL/min — ABNORMAL LOW (ref >60)
GFR calc non Af Amer: 43 mL/min — ABNORMAL LOW (ref >60)
Glucose, Bld: 134 mg/dL — ABNORMAL HIGH (ref 70–99)
Potassium: 3.5 mmol/L (ref 3.5–5.1)
Sodium: 138 mmol/L (ref 135–145)
Total Bilirubin: 0.5 mg/dL (ref 0.3–1.2)
Total Protein: 7.2 g/dL (ref 6.5–8.1)

## 2019-02-05 LAB — PROTIME-INR
INR: 0.9 (ref 0.8–1.2)
Prothrombin Time: 12.5 seconds (ref 11.4–15.2)

## 2019-02-05 LAB — CBC
HCT: 39.4 % (ref 39.0–52.0)
Hemoglobin: 13.3 g/dL (ref 13.0–17.0)
MCH: 28.6 pg (ref 26.0–34.0)
MCHC: 33.8 g/dL (ref 30.0–36.0)
MCV: 84.7 fL (ref 80.0–100.0)
Platelets: 207 10*3/uL (ref 150–400)
RBC: 4.65 MIL/uL (ref 4.22–5.81)
RDW: 14 % (ref 11.5–15.5)
WBC: 7.5 10*3/uL (ref 4.0–10.5)
nRBC: 0 % (ref 0.0–0.2)

## 2019-02-05 LAB — URINALYSIS, ROUTINE W REFLEX MICROSCOPIC
Bacteria, UA: NONE SEEN
Bilirubin Urine: NEGATIVE
Glucose, UA: NEGATIVE mg/dL
Hgb urine dipstick: NEGATIVE
Ketones, ur: NEGATIVE mg/dL
Leukocytes,Ua: NEGATIVE
Nitrite: NEGATIVE
Protein, ur: NEGATIVE mg/dL
Specific Gravity, Urine: 1.01 (ref 1.005–1.030)
Squamous Epithelial / HPF: NONE SEEN (ref 0–5)
pH: 6 (ref 5.0–8.0)

## 2019-02-05 LAB — SURGICAL PCR SCREEN
MRSA, PCR: NEGATIVE
Staphylococcus aureus: NEGATIVE

## 2019-02-05 LAB — TYPE AND SCREEN
ABO/RH(D): O POS
Antibody Screen: NEGATIVE

## 2019-02-05 LAB — SEDIMENTATION RATE: Sed Rate: 37 mm/hr — ABNORMAL HIGH (ref 0–20)

## 2019-02-05 LAB — C-REACTIVE PROTEIN: CRP: <0.8 mg/dL (ref <1.0)

## 2019-02-05 MED ORDER — LOSARTAN POTASSIUM 100 MG PO TABS: 100.0000 mg | ORAL_TABLET | Freq: Every day | ORAL | 3 refills | Status: DC

## 2019-02-05 NOTE — Patient Instructions (Addendum)
Sign release for [facility] records,  Flutter ablation details  Tell the surgical team to go slow with the fluids   Medication Instructions:  Your physician has recommended you make the following change in your medication:  1. STOP Hydralazine  Your physician has requested that you regularly monitor and record your blood pressure readings at home. Please use the same machine at the same time of day to check your readings and record them to bring to your follow-up visit.  Please read materials below on monitoring your blood pressures and keep a log so we can see how they trend.    If you need a refill on your cardiac medications before your next appointment, please call your pharmacy.    Lab work: No new labs needed   If you have labs (blood work) drawn today and your tests are completely normal, you will receive your results only by: Marland Kitchen MyChart Message (if you have MyChart) OR . A paper copy in the mail If you have any lab test that is abnormal or we need to change your treatment, we will call you to review the results.   Testing/Procedures: No new testing needed   Follow-Up: At [facility], you and your health needs are our priority.  As part of our continuing mission to provide you with exceptional heart care, we have created designated Provider Care Teams.  These Care Teams include your primary Cardiologist (physician) and Advanced Practice Providers (APPs -  Physician Assistants and Nurse Practitioners) who all work together to provide you with the care you need, when you need it.  . You will need a follow up appointment in 12 months .   Please call our office 2 months in advance to schedule this appointment.    . Providers on your designated Care Team:   . Murray Hodgkins, NP . Christell Faith, PA-C . Marrianne Mood, PA-C  Any Other Special Instructions Will Be Listed Below (If Applicable).  For educational health videos Log in to : [email or web address] Or :  [email or web address], password : [email or web address]     How to Take Your Blood Pressure You can take your blood pressure at home with a machine. You may need to check your blood pressure at home:  To check if you have high blood pressure (hypertension).  To check your blood pressure over time.  To make sure your blood pressure medicine is working. Supplies needed: You will need a blood pressure machine, or monitor. You can buy one at a drugstore or online. When choosing one:  Choose one with an arm cuff.  Choose one that wraps around your upper arm. Only one finger should fit between your arm and the cuff.  Do not choose one that measures your blood pressure from your wrist or finger. Your doctor can suggest a monitor. How to prepare Avoid these things for 30 minutes before checking your blood pressure:  Drinking caffeine.  Drinking alcohol.  Eating.  Smoking.  Exercising. Five minutes before checking your blood pressure:  Pee.  Sit in a dining chair. Avoid sitting in a soft couch or armchair.  Be quiet. Do not talk. How to take your blood pressure Follow the instructions that came with your machine. If you have a digital blood pressure monitor, these may be the instructions: 1. Sit up straight. 2. Place your feet on the floor. Do not cross your ankles or legs. 3. Rest your left arm at the level of your heart. You may rest it on a  table, desk, or chair. 4. Pull up your shirt sleeve. 5. Wrap the blood pressure cuff around the upper part of your left arm. The cuff should be 1 inch (2.5 cm) above your elbow. It is best to wrap the cuff around bare skin. 6. Fit the cuff snugly around your arm. You should be able to place only one finger between the cuff and your arm. 7. Put the cord inside the groove of your elbow. 8. Press the power button. 9. Sit quietly while the cuff fills with air and loses air. 10. Write down the numbers on the screen. 11. Wait 2-3 minutes and then repeat steps  1-10. What do the numbers mean? Two numbers make up your blood pressure. The first number is called systolic pressure. The second is called diastolic pressure. An example of a blood pressure reading is "120 over 80" (or 120/80). If you are an adult and do not have a medical condition, use this guide to find out if your blood pressure is normal: Normal  First number: below 120.  Second number: below 80. Elevated  First number: 120-129.  Second number: below 80. Hypertension stage 1  First number: 130-139.  Second number: 80-89. Hypertension stage 2  First number: 140 or above.  Second number: 23 or above. Your blood pressure is above normal even if only the top or bottom number is above normal. Follow these instructions at home:  Check your blood pressure as often as your doctor tells you to.  Take your monitor to your next doctor's appointment. Your doctor will: ? Make sure you are using it correctly. ? Make sure it is working right.  Make sure you understand what your blood pressure numbers should be.  Tell your doctor if your medicines are causing side effects. Contact a doctor if:  Your blood pressure keeps being high. Get help right away if:  Your first blood pressure number is higher than 180.  Your second blood pressure number is higher than 120. This information is not intended to replace advice given to you by your health care provider. Make sure you discuss any questions you have with your health care provider. Document Released: 07/08/2008 Document Revised: 07/08/2017 Document Reviewed: 01/02/2016 Elsevier Patient Education  2020 [place].  Blood Pressure Record Sheet To take your blood pressure, you will need a blood pressure machine. You can buy a blood pressure machine (blood pressure monitor) at your clinic, drug store, or online. When choosing one, consider:  An automatic monitor that has an arm cuff.  A cuff that wraps snugly around your upper  arm. You should be able to fit only one finger between your arm and the cuff.  A device that stores blood pressure reading results.  Do not choose a monitor that measures your blood pressure from your wrist or finger. Follow your health care provider's instructions for how to take your blood pressure. To use this form:  Get one reading in the morning (a.m.) before you take any medicines.  Get one reading in the evening (p.m.) before supper.  Take at least 2 readings with each blood pressure check. This makes sure the results are correct. Wait 1-2 minutes between measurements.  Write down the results in the spaces on this form.  Repeat this once a week, or as told by your health care provider.  Make a follow-up appointment with your health care provider to discuss the results. Blood pressure log Date: _______________________  a.m. _____________________(1st reading) _____________________(2nd reading)  p.m. _____________________(1st reading) _____________________(2nd reading) Date: _______________________  a.m. _____________________(1st reading) _____________________(2nd reading)  p.m. _____________________(1st reading) _____________________(2nd reading) Date: _______________________  a.m. _____________________(1st reading) _____________________(2nd reading)  p.m. _____________________(1st reading) _____________________(2nd reading) Date: _______________________  a.m. _____________________(1st reading) _____________________(2nd reading)  p.m. _____________________(1st reading) _____________________(2nd reading) Date: _______________________  a.m. _____________________(1st reading) _____________________(2nd reading)  p.m. _____________________(1st reading) _____________________(2nd reading) This information is not intended to replace advice given to you by your health care provider. Make sure you discuss any questions you have with your health care provider. Document  Released: 04/24/2003 Document Revised: 09/23/2017 Document Reviewed: 07/26/2017 Elsevier Patient Education  2020 [employer].

## 2019-02-05 NOTE — Pre-Procedure Instructions (Signed)
Daniel Merritts, Kline  Physician  Cardiology  Progress Notes  Signed  Encounter Date:  02/05/2019          Signed      Expand All Collapse All    Show:Clear all [x] Manual[x] Template[x] Copied  Added by: [x] Daniel Kline  [] Hover for details Cardiology Office Note  Date:  02/05/2019   ID:  Daniel Kline, DOB 1946/07/02, MRN [id number]  PCP:  Daniel Carbon, Kline        Chief Complaint  Patient presents with  . Other    Past due 6 month follow up. Patient needs Cardiac Clearance.  Patient denies chest pain and SOB. Meds reviewed verbally with patient.     HPI:  [name] is a 73 year old gentleman with past medical history of Diabetes Chronic pain Obstructive sleep apnea, uses CPAP F/u for his  Hypertension, atrial flutter s/p ablation 04/2018 at [facility].  Incidentally found to be in atrial flutter when presented for knee replacement.  Reports he had successful ablation We have requested these records  Needs surgical clearance , Right Total Knee Arthroplasty Can't ride his bike past month secondary to worsening knee pain  Losing weight Trying to change his diet  Ran out of his hydralazine past month Does not check his blood pressure at home  In general has had good exercise tolerance  Blood pressure medications include  amlodipine, carvedilol,  losartan, Lasix   Lab work reviewed with him CR  1.5 HBA1C 6.7 down from 7.5 Total chol 95, LDL 33  EKG personally reviewed by myself on todays visit Shows normal sinus rhythm rate 64 bpm no significant ST or T wave changes   PMH:   has a past medical history of Arthritis, 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  PSH:         Past Surgical History:  Procedure Laterality Date  . BACK SURGERY    . CARDIAC ELECTROPHYSIOLOGY STUDY AND ABLATION  2019  . right elbow surgery    . right knee surgery    . right shoulder surgery     from polio damage  . spleenectomy    . TOTAL HIP ARTHROPLASTY Bilateral 04/2016          Current Outpatient Medications  Medication Sig Dispense Refill  . amLODipine (NORVASC) 10 MG tablet TAKE 1 TABLET BY MOUTH  DAILY (Patient taking differently: Take 10 mg by mouth daily. ) 90 tablet 3  . Apoaequorin (PREVAGEN PO) Take 1 tablet by mouth daily.    . carvedilol (COREG) 6.25 MG tablet TAKE 1 TABLET BY MOUTH 2  TIMES DAILY WITH A MEAL 180 tablet 3  . Cholecalciferol (VITAMIN D-3 PO) Take 10,000 Units by mouth daily.     Daniel Kline Bark POWD Take 500 mg by mouth daily.     . clobetasol cream (TEMOVATE) 8.45 % Apply 1 application topically 2 (two) times daily as needed (irritation).     . clorazepate (TRANXENE) 7.5 MG tablet TAKE 1 TABLET (7.5 MG TOTAL) BY MOUTH 2 (TWO) TIMES DAILY AS NEEDED FOR ANXIETY. 60 tablet 0  . Elastic Bandages & Supports (MEDICAL COMPRESSION STOCKINGS) MISC 2 Units by Does not apply route daily. 2 each 0  . EPINEPHrine 0.3 mg/0.3 mL IJ SOAJ injection Inject 0.3 mg into the muscle  as needed for anaphylaxis.     . furosemide (LASIX) 20 MG tablet TAKE ONE TABLET EVERY MORNING 90 tablet 1  . gabapentin (NEURONTIN) 300 MG capsule TAKE 1 CAPSULE BY MOUTH 3  TIMES DAILY (Patient taking differently: Take 300 mg by mouth 3 (three) times daily. ) 180 capsule 0  . glipiZIDE (GLUCOTROL) 5 MG tablet TAKE ONE-HALF TABLET BY  MOUTH 2 TIMES DAILY BEFORE  A MEAL. (Patient taking differently: Take 2.5 mg by mouth 2 (two) times daily before a meal. ) 90 tablet 3  . hydrALAZINE (APRESOLINE) 100 MG tablet Take 1 tablet (100 mg total) by mouth 3 (three) times daily. 90 tablet 3  .  HYDROcodone-acetaminophen (NORCO/VICODIN) 5-325 MG tablet TAKE 1 TABLET BY MOUTH TWICE DAILY AS NEEDED FOR PAIN (Patient taking differently: Take 1 tablet by mouth 2 (two) times daily as needed for moderate pain. ) 60 tablet 0  . hydrOXYzine (ATARAX/VISTARIL) 25 MG tablet TAKE ONE TABLET EVERY EIGHT HOURS AS NEEDED (Patient taking differently: Take 25 mg by mouth every 8 (eight) hours as needed for anxiety. ) 90 tablet 1  . isosorbide mononitrate (IMDUR) 60 MG 24 hr tablet Take 60 mg by mouth daily.     Marland Kitchen JANUVIA 100 MG tablet TAKE 1 TABLET BY MOUTH  DAILY (Patient taking differently: Take 100 mg by mouth daily. ) 90 tablet 3  . metFORMIN (GLUCOPHAGE) 500 MG tablet TAKE 1 TABLET BY MOUTH TWO  TIMES DAILY (Patient taking differently: Take 500 mg by mouth 2 (two) times daily with a meal. ) 180 tablet 3  . Multiple Vitamins-Minerals (MULTIVITAL) tablet Take 1 tablet by mouth daily.      . ONE TOUCH ULTRA TEST test strip CHECK BLOOD SUGAR 3 TIMES DAILY AS DIRECTED 100 each 1  . rosuvastatin (CRESTOR) 10 MG tablet Take 1 tablet (10 mg total) by mouth daily. 90 tablet 0   No current facility-administered medications for this visit.      Allergies:   Bee venom, Oxycodone, Hydromorphone, and Zolpidem   Social History:  The patient  reports that he quit smoking about 28 years ago. He quit smokeless tobacco use about 12 years ago. He reports that he does not drink alcohol or use drugs.   Family History:   family history is not on file. He was adopted.    Review of Systems: Review of Systems  Constitutional: Negative.   Respiratory: Negative.   Cardiovascular: Negative.   Gastrointestinal: Negative.   Musculoskeletal: Positive for joint pain.  Neurological: Negative.   Psychiatric/Behavioral: Negative.   All other systems reviewed and are negative.   PHYSICAL EXAM: VS:  BP 140/70 (BP Location: Left Arm, Patient Position: Sitting, Cuff Size: Normal)   Pulse 64   Ht 5\' 6"  (1.676 m)    Wt 240 lb (108.9 kg)   BMI 38.74 kg/m  , BMI Body mass index is 38.74 kg/m. GEN: Well nourished, well developed, in no acute distress  HEENT: normal  Neck: no JVD, carotid bruits, or masses Cardiac: RRR; no murmurs, rubs, or gallops,no edema  Respiratory:  clear to auscultation bilaterally, normal work of breathing GI: soft, nontender, nondistended, + BS MS: no deformity or atrophy  Skin: warm and dry, no rash Neuro:  Strength and sensation are intact Psych: euthymic mood, full affect   Recent Labs: No results found for requested labs within last 8760 hours.    Lipid Panel Recent Labs       Lab Results  Component Value Date  CHOL 95 01/09/2018   HDL 27.60 (L) 01/09/2018   LDLCALC 33 01/09/2018   TRIG 175.0 (H) 01/09/2018           Wt Readings from Last 3 Encounters:  02/05/19 240 lb (108.9 kg)  09/06/18 252 lb (114.3 kg)  06/22/18 250 lb (113.4 kg)       ASSESSMENT AND PLAN:  Preop cardiovascular clearance for knee surgery Acceptable risk, no further testing needed No medication changes needed prior to surgery Would recommend we moderate IV fluids pre-and post surgery  Atrial flutter Reports having ablation 2019, records have been requested Not on anticoagulation  Diabetes mellitus with nephropathy ([place]) - Plan: EKG 12-Lead Stressed importance of low carbohydrate diet Numbers are improved A1c 6.7  CKD (chronic kidney disease), stage II Stable CR 1.4 to 1.6 Stable  Essential hypertension - Plan: EKG 12-Lead We will hold the hydralazine Blood pressure appears improved after 10 pound weight loss He has not been on hydralazine the past month Recommend he check blood pressures at home and call us if it runs high  OSA (obstructive sleep apnea) On CPAP, doing well, compliant  Disposition:   F/U  12 months   Total encounter time more than 25 minutes  Greater than 50% was spent in counseling and coordination of care with the  patient       Orders Placed This Encounter  Procedures  . EKG 12-Lead     Signed, Esmond Plants, M.D., Ph.D. 02/05/2019  [place], [place] [phone]         Electronically signed by Daniel Merritts, Kline at 02/05/2019 10:35 AM   Office Visit on 02/05/2019     Detailed Report

## 2019-02-05 NOTE — Patient Instructions (Signed)
Your procedure is scheduled on: 02-14-19 [facility] Report to Same Day Surgery 2nd floor medical mall [facility] Entrance-take elevator on left to 2nd floor.  Check in with surgery information desk.) To find out your arrival time please call [phone] between 1PM - 3PM on 02-13-19 TUESDAY  Remember: Instructions that are not followed completely may result in serious medical risk, up to and including death, or upon the discretion of your surgeon and anesthesiologist your surgery may need to be rescheduled.    _x___ 1. Do not eat food after midnight the night before your procedure. NO GUM OR CANDY AFTER MIDNIGHT. You may drink WATER up to 2 hours before you are scheduled to arrive at the hospital for your procedure.  Do not drink WATER within 2 hours of your scheduled arrival to the hospital.  Type 1 and type 2 diabetics should only drink water.   ____Ensure clear carbohydrate drink on the way to the hospital for bariatric patients  __X__G2 GATORADE drink 3 hours before surgery for      __x__ 2. No Alcohol for 24 hours before or after surgery.   __x__3. No Smoking or e-cigarettes for 24 prior to surgery.  Do not use any chewable tobacco products for at least 6 hour prior to surgery   ____  4. Bring all medications with you on the day of surgery if instructed.    __x__ 5. Notify your doctor if there is any change in your medical condition     (cold, fever, infections).    x___6. On the morning of surgery brush your teeth with toothpaste and water.  You may rinse your mouth with mouth wash if you wish.  Do not swallow any toothpaste or mouthwash.   Do not wear jewelry, make-up, hairpins, clips or nail polish.  Do not wear lotions, powders, or perfumes. You may wear deodorant.  Do not shave 48 hours prior to surgery. Men may shave face and neck.  Do not bring valuables to the hospital.    [facility] is not responsible for any belongings or valuables.               Contacts, dentures  or bridgework may not be worn into surgery.  Leave your suitcase in the car. After surgery it may be brought to your room.  For patients admitted to the hospital, discharge time is determined by your treatment team.  _  Patients discharged the day of surgery will not be allowed to drive home.  You will need someone to drive you home and stay with you the night of your procedure.    Please read over the following fact sheets that you were given:   [facility] Preparing for Surgery and or MRSA Information   _x___ TAKE THE FOLLOWING MEDICATION THE MORNING OF SURGERY WITH A SMALL SIP OF WATER. These include:  1. NORVASC (AMLODIPINE)  2. COREG (CARVEDILOL)  3. GABAPENTIN (NEURONTIN)  4. IMDUR (ISOSORBIDE)  5. CRESTOR (ROSUVASTATIN)  6.  ____Fleets enema or Magnesium Citrate as directed.   _x___ Use CHG Soap or sage wipes as directed on instruction sheet   ____ Use inhalers on the day of surgery and bring to hospital day of surgery  _X___ Stop Metformin 2 days prior to surgery-LAST DOSE ON Sunday, July 5TH   ____ Take 1/2 of usual insulin dose the night before surgery and none on the morning surgery.   _x___ Follow recommendations from Cardiologist, Pulmonologist or PCP regarding  stopping Aspirin, Coumadin,  Plavix ,Eliquis, Effient, or Pradaxa, and Pletal.  X____Stop Anti-inflammatories such as Advil, Aleve, Ibuprofen, Motrin, Naproxen, Naprosyn, Goodies powders or aspirin products 7 DAYS PRIOR TO SURGERY-OK to take Tylenol    _x___ Stop supplements until after surgery-STOP PREVAGEN AND CINNAMON BARK 7 DAYS PRIOR TO SURGERY-MAY RESUME AFTER SURGERY  _X___ Bring BI-Pap to the hospital.

## 2019-02-06 ENCOUNTER — Telehealth: Payer: Medicare Other | Admitting: Nurse Practitioner

## 2019-02-06 LAB — HEMOGLOBIN A1C
Hgb A1c MFr Bld: 8.7 % — ABNORMAL HIGH (ref 4.8–5.6)
Mean Plasma Glucose: 203 mg/dL

## 2019-02-06 LAB — URINE CULTURE: Culture: NO GROWTH

## 2019-02-09 ENCOUNTER — Other Ambulatory Visit: Admission: RE | Admit: 2019-02-09 | Payer: Medicare Other | Source: Ambulatory Visit

## 2019-02-14 ENCOUNTER — Inpatient Hospital Stay: Admit: 2019-02-14 | Payer: Medicare Other | Source: Ambulatory Visit | Admitting: Orthopedic Surgery

## 2019-02-14 ENCOUNTER — Encounter: Payer: Self-pay | Source: Ambulatory Visit

## 2019-02-14 SURGERY — ARTHROPLASTY, KNEE, TOTAL, USING IMAGELESS COMPUTER-ASSISTED NAVIGATION
Anesthesia: Choice | Laterality: Right

## 2019-02-15 ENCOUNTER — Other Ambulatory Visit: Payer: Self-pay | Admitting: Internal Medicine

## 2019-02-19 DIAGNOSIS — B351 Tinea unguium: Secondary | ICD-10-CM | POA: Diagnosis not present

## 2019-02-19 DIAGNOSIS — E1142 Type 2 diabetes mellitus with diabetic polyneuropathy: Secondary | ICD-10-CM | POA: Diagnosis not present

## 2019-02-27 ENCOUNTER — Other Ambulatory Visit: Payer: Self-pay | Admitting: Internal Medicine

## 2019-03-01 ENCOUNTER — Other Ambulatory Visit: Payer: Self-pay | Admitting: Internal Medicine

## 2019-03-19 ENCOUNTER — Telehealth: Payer: Self-pay | Admitting: Internal Medicine

## 2019-03-19 MED ORDER — CLORAZEPATE DIPOTASSIUM 7.5 MG PO TABS: 7.5000 mg | ORAL_TABLET | Freq: Two times a day (BID) | ORAL | 0 refills | Status: DC | PRN

## 2019-03-19 MED ORDER — GABAPENTIN 300 MG PO CAPS: 300.0000 mg | ORAL_CAPSULE | Freq: Three times a day (TID) | ORAL | 3 refills | Status: DC

## 2019-03-19 NOTE — Telephone Encounter (Signed)
Patient requested refills. Stated that the pharmacies advised him they sent over refill request but I did not see anything   Cl[facility]ate- [facility] drMarland Kitchen Lorina Rabon  (completely out of medication)    Gabapentin - [facility]  (completely out of medication)    Patient would like a call back once sent C/B [phone]    Also, Patient stated that his a1c on July 8th 8.7 He stated that he is trying to eat right and stay active as much as possible. He has not checked it recently.

## 2019-03-19 NOTE — Telephone Encounter (Signed)
Let him know Rx sent Set up appt within 2 months

## 2019-03-19 NOTE — Telephone Encounter (Signed)
Clorazepate last filled 10-11-18 #[facility]  Gabapentin last filled 10-30-18 #[facility]  Last OV 09-06-18 No Future OV

## 2019-03-22 DIAGNOSIS — G5602 Carpal tunnel syndrome, left upper limb: Secondary | ICD-10-CM | POA: Diagnosis not present

## 2019-03-28 DIAGNOSIS — H401111 Primary open-angle glaucoma, right eye, mild stage: Secondary | ICD-10-CM | POA: Diagnosis not present

## 2019-03-30 ENCOUNTER — Other Ambulatory Visit: Payer: Self-pay

## 2019-03-30 ENCOUNTER — Other Ambulatory Visit
Admission: RE | Admit: 2019-03-30 | Discharge: 2019-03-30 | Disposition: A | Discharge status: Home / Self Care | Payer: Medicare Other | Source: Ambulatory Visit | Attending: Internal Medicine | Admitting: Internal Medicine

## 2019-03-30 DIAGNOSIS — Z20828 Contact with and (suspected) exposure to other viral communicable diseases: Secondary | ICD-10-CM | POA: Diagnosis not present

## 2019-03-30 DIAGNOSIS — Z01812 Encounter for preprocedural laboratory examination: Secondary | ICD-10-CM | POA: Diagnosis not present

## 2019-03-30 LAB — SARS CORONAVIRUS 2 (TAT 6-24 HRS): SARS Coronavirus 2: NEGATIVE

## 2019-04-02 DIAGNOSIS — Z79899 Other long term (current) drug therapy: Secondary | ICD-10-CM | POA: Diagnosis not present

## 2019-04-02 DIAGNOSIS — C84A0 Cutaneous T-cell lymphoma, unspecified, unspecified site: Secondary | ICD-10-CM | POA: Diagnosis not present

## 2019-04-03 ENCOUNTER — Encounter: Payer: Self-pay | Admitting: *Deleted

## 2019-04-04 ENCOUNTER — Ambulatory Visit: Payer: Medicare Other | Admitting: Anesthesiology

## 2019-04-04 ENCOUNTER — Encounter
Admission: RE | Disposition: A | Discharge status: Home / Self Care | Payer: Self-pay | Source: Home / Self Care | Attending: Internal Medicine

## 2019-04-04 ENCOUNTER — Ambulatory Visit
Admission: RE | Admit: 2019-04-04 | Discharge: 2019-04-04 | Disposition: A | Discharge status: Home / Self Care | Payer: Medicare Other | Attending: Internal Medicine | Admitting: Internal Medicine

## 2019-04-04 ENCOUNTER — Encounter: Payer: Self-pay | Admitting: *Deleted

## 2019-04-04 DIAGNOSIS — D649 Anemia, unspecified: Secondary | ICD-10-CM | POA: Insufficient documentation

## 2019-04-04 DIAGNOSIS — Z8612 Personal history of poliomyelitis: Secondary | ICD-10-CM | POA: Diagnosis not present

## 2019-04-04 DIAGNOSIS — K635 Polyp of colon: Secondary | ICD-10-CM | POA: Diagnosis not present

## 2019-04-04 DIAGNOSIS — N189 Chronic kidney disease, unspecified: Secondary | ICD-10-CM | POA: Insufficient documentation

## 2019-04-04 DIAGNOSIS — Z7984 Long term (current) use of oral hypoglycemic drugs: Secondary | ICD-10-CM | POA: Diagnosis not present

## 2019-04-04 DIAGNOSIS — Z1211 Encounter for screening for malignant neoplasm of colon: Secondary | ICD-10-CM | POA: Diagnosis not present

## 2019-04-04 DIAGNOSIS — D124 Benign neoplasm of descending colon: Secondary | ICD-10-CM | POA: Insufficient documentation

## 2019-04-04 DIAGNOSIS — Z79899 Other long term (current) drug therapy: Secondary | ICD-10-CM | POA: Insufficient documentation

## 2019-04-04 DIAGNOSIS — Z8572 Personal history of non-Hodgkin lymphomas: Secondary | ICD-10-CM | POA: Insufficient documentation

## 2019-04-04 DIAGNOSIS — Z87891 Personal history of nicotine dependence: Secondary | ICD-10-CM | POA: Diagnosis not present

## 2019-04-04 DIAGNOSIS — K648 Other hemorrhoids: Secondary | ICD-10-CM | POA: Diagnosis not present

## 2019-04-04 DIAGNOSIS — E785 Hyperlipidemia, unspecified: Secondary | ICD-10-CM | POA: Insufficient documentation

## 2019-04-04 DIAGNOSIS — G4730 Sleep apnea, unspecified: Secondary | ICD-10-CM | POA: Diagnosis not present

## 2019-04-04 DIAGNOSIS — D123 Benign neoplasm of transverse colon: Secondary | ICD-10-CM | POA: Diagnosis not present

## 2019-04-04 DIAGNOSIS — I129 Hypertensive chronic kidney disease with stage 1 through stage 4 chronic kidney disease, or unspecified chronic kidney disease: Secondary | ICD-10-CM | POA: Insufficient documentation

## 2019-04-04 DIAGNOSIS — K5730 Diverticulosis of large intestine without perforation or abscess without bleeding: Secondary | ICD-10-CM | POA: Insufficient documentation

## 2019-04-04 DIAGNOSIS — E1122 Type 2 diabetes mellitus with diabetic chronic kidney disease: Secondary | ICD-10-CM | POA: Insufficient documentation

## 2019-04-04 DIAGNOSIS — K640 First degree hemorrhoids: Secondary | ICD-10-CM | POA: Diagnosis not present

## 2019-04-04 DIAGNOSIS — F419 Anxiety disorder, unspecified: Secondary | ICD-10-CM | POA: Insufficient documentation

## 2019-04-04 DIAGNOSIS — Z8371 Family history of colonic polyps: Secondary | ICD-10-CM | POA: Diagnosis not present

## 2019-04-04 DIAGNOSIS — Z85828 Personal history of other malignant neoplasm of skin: Secondary | ICD-10-CM | POA: Diagnosis not present

## 2019-04-04 DIAGNOSIS — G4733 Obstructive sleep apnea (adult) (pediatric): Secondary | ICD-10-CM | POA: Insufficient documentation

## 2019-04-04 DIAGNOSIS — K5790 Diverticulosis of intestine, part unspecified, without perforation or abscess without bleeding: Secondary | ICD-10-CM | POA: Diagnosis not present

## 2019-04-04 DIAGNOSIS — D126 Benign neoplasm of colon, unspecified: Secondary | ICD-10-CM | POA: Diagnosis not present

## 2019-04-04 HISTORY — PX: COLONOSCOPY WITH PROPOFOL: SHX5780

## 2019-04-04 LAB — GLUCOSE, CAPILLARY: Glucose-Capillary: 138 mg/dL — ABNORMAL HIGH (ref 70–99)

## 2019-04-04 SURGERY — COLONOSCOPY WITH PROPOFOL
Anesthesia: General

## 2019-04-04 MED ORDER — ONDANSETRON HCL 4 MG/2ML IJ SOLN
INTRAMUSCULAR | Status: DC | PRN
  Administered 2019-04-04: 4 mg via INTRAVENOUS

## 2019-04-04 MED ORDER — PROPOFOL 10 MG/ML IV BOLUS
INTRAVENOUS | Status: DC | PRN
  Administered 2019-04-04: 80 mg via INTRAVENOUS

## 2019-04-04 MED ORDER — PROPOFOL 500 MG/50ML IV EMUL
INTRAVENOUS | Status: DC | PRN
  Administered 2019-04-04: 18 ug/kg/min via INTRAVENOUS
  Administered 2019-04-04: 150 ug/kg/min via INTRAVENOUS

## 2019-04-04 MED ORDER — SODIUM CHLORIDE 0.9 % IV SOLN
INTRAVENOUS | Status: DC
  Administered 2019-04-04 (×2): via INTRAVENOUS

## 2019-04-04 NOTE — H&P (Signed)
Outpatient short stay form Pre-procedure 04/04/2019 9:42 AM Corinne Goucher K. Alice Reichert, M.D.  Primary Physician: Viviana Simpler, M.D.  Reason for visit: Family hx of colon polyps  History of present illness: 73 year old patient presenting for family history of colon polyps. Patient denies any change in bowel habits, rectal bleeding or involuntary weight loss.    Current Facility-Administered Medications:  .  0.9 %  sodium chloride infusion, , Intravenous, Continuous, Tavarus Poteete, Benay Pike, MD  Medications Prior to Admission  Medication Sig Dispense Refill Last Dose  . amLODipine (NORVASC) 10 MG tablet TAKE 1 TABLET BY MOUTH  DAILY (Patient taking differently: Take 10 mg by mouth daily before lunch. ) 90 tablet 3 04/04/2019 at 0430  . Apoaequorin (PREVAGEN PO) Take 1 tablet by mouth daily.   Past Week at Unknown time  . carvedilol (COREG) 6.25 MG tablet TAKE 1 TABLET BY MOUTH 2  TIMES DAILY WITH A MEAL 180 tablet 3 04/04/2019 at 0430  . Cholecalciferol (VITAMIN D-3 PO) Take 10,000 Units by mouth daily.    Past Week at Unknown time  . Cinnamon Bark POWD Take 500 mg by mouth daily.    Past Week at Unknown time  . clobetasol cream (TEMOVATE) 7.82 % Apply 1 application topically 2 (two) times daily as needed (irritation).    04/03/2019 at Unknown time  . clorazepate (TRANXENE) 7.5 MG tablet Take 1 tablet (7.5 mg total) by mouth 2 (two) times daily as needed for anxiety. 60 tablet 0 04/03/2019 at Unknown time  . Ferrous Sulfate (IRON PO) Take 1 tablet by mouth at bedtime.   Past Week at Unknown time  . furosemide (LASIX) 20 MG tablet TAKE ONE TABLET EVERY MORNING (Patient taking differently: Take 20 mg by mouth 2 (two) times a day. ) 90 tablet 1 04/03/2019 at Unknown time  . gabapentin (NEURONTIN) 300 MG capsule Take 1 capsule (300 mg total) by mouth 3 (three) times daily. 180 capsule 3 04/03/2019 at Unknown time  . glipiZIDE (GLUCOTROL) 5 MG tablet TAKE ONE-HALF TABLET BY  MOUTH 2 TIMES DAILY BEFORE  A MEAL.  (Patient taking differently: Take 2.5 mg by mouth 2 (two) times daily before a meal. ) 90 tablet 3 04/03/2019 at Unknown time  . isosorbide mononitrate (IMDUR) 60 MG 24 hr tablet Take 60 mg by mouth daily before lunch.    04/03/2019 at Unknown time  . JANUVIA 100 MG tablet TAKE 1 TABLET BY MOUTH  DAILY 90 tablet 3 04/03/2019 at Unknown time  . losartan (COZAAR) 100 MG tablet Take 1 tablet (100 mg total) by mouth daily. (Patient taking differently: Take 100 mg by mouth daily before lunch. ) 90 tablet 3 04/03/2019 at Unknown time  . metFORMIN (GLUCOPHAGE) 500 MG tablet TAKE 1 TABLET BY MOUTH TWO  TIMES DAILY (Patient taking differently: Take 500 mg by mouth 2 (two) times daily with a meal. ) 180 tablet 3 04/03/2019 at Unknown time  . Multiple Vitamins-Minerals (MULTIVITAL) tablet Take 1 tablet by mouth daily.     Past Week at Unknown time  . ONE TOUCH ULTRA TEST test strip CHECK BLOOD SUGAR 3 TIMES DAILY AS DIRECTED 100 each 1 Past Week at Unknown time  . rosuvastatin (CRESTOR) 10 MG tablet Take 1 tablet (10 mg total) by mouth daily. (Patient taking differently: Take 10 mg by mouth daily before lunch. ) 90 tablet 0 04/03/2019 at Unknown time  . Elastic Bandages & Supports (MEDICAL COMPRESSION STOCKINGS) MISC 2 Units by Does not apply route daily. 2  each 0   . EPINEPHrine 0.3 mg/0.3 mL IJ SOAJ injection Inject 0.3 mg into the muscle as needed for anaphylaxis.      Marland Kitchen HYDROcodone-acetaminophen (NORCO/VICODIN) 5-325 MG tablet TAKE 1 TABLET BY MOUTH TWICE DAILY AS NEEDED FOR PAIN (Patient not taking: No sig reported) 60 tablet 0   . hydrOXYzine (ATARAX/VISTARIL) 25 MG tablet TAKE 1 TABLET BY MOUTH EVERY 8 HOURS AS NEEDED. 90 tablet 0 prn  . Interferon Alfa-2B (INTRON-A IJ) Inject 1 Dose as directed 3 (three) times a week.   Completed Course at Unknown time     Allergies  Allergen Reactions  . Bee Venom Anaphylaxis  . Oxycodone Other (See Comments)    Delusions  . Hydromorphone Other (See Comments)     hallucinating  . Zolpidem Other (See Comments)     Past Medical History:  Diagnosis Date  . Anemia    H/O  . Anxiety   . Arthritis   . Chronic kidney disease   . Complication of anesthesia   . 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 Diabetes mellitus without complication ([place])   . Dysrhythmia    A FLUTTER  . Family history of adverse reaction to anesthesia    PT WAS ADOPTED  . HLD (hyperlipidemia)   . HTN (hypertension)   . HTN (hypertension)   . Hx of dysplastic nevus 2019   multiple sites  . 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 MRSA (methicillin resistant Staphylococcus aureus)    after back surgery  . OSA (obstructive sleep apnea)    USES BIPAP  . Polio   . 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 PONV (postoperative nausea and vomiting)     Review of systems:  Otherwise negative.    Physical Exam  Gen: Alert, oriented. Appears stated age.  HEENT: [place]/AT. PERRLA. Lungs: CTA, no wheezes. CV: RR nl S1, S2. Abd: soft, benign, no masses. BS+ Ext: No edema. Pulses 2+    Planned procedures: Proceed with colonoscopy. The patient understands the nature of the planned procedure, indications, risks, alternatives and potential complications including but not limited to bleeding, infection, perforation, damage to internal organs and possible oversedation/side effects from anesthesia. The patient agrees and gives consent to proceed.  Please refer to procedure notes for findings, recommendations and patient disposition/instructions.     Quantavius Humm K. Alice Reichert, M.D. Gastroenterology 04/04/2019  9:42 AM

## 2019-04-04 NOTE — Anesthesia Postprocedure Evaluation (Signed)
Anesthesia Post Note  Patient: Daniel Kline  Procedure(s) Performed: COLONOSCOPY WITH PROPOFOL (N/A )  Patient location during evaluation: Endoscopy Anesthesia Type: General Level of consciousness: awake and alert and oriented Pain management: pain level controlled Vital Signs Assessment: post-procedure vital signs reviewed and stable Respiratory status: spontaneous breathing, nonlabored ventilation and respiratory function stable Cardiovascular status: blood pressure returned to baseline and stable Postop Assessment: no signs of nausea or vomiting Anesthetic complications: no     Last Vitals:  Vitals:   04/04/19 1143 04/04/19 1153  BP: 124/77 (!) 143/84  Pulse:    Resp:    Temp:    SpO2:      Last Pain:  Vitals:   04/04/19 1153  TempSrc:   PainSc: 0-No pain                 Eddith Mentor

## 2019-04-04 NOTE — Transfer of Care (Signed)
Immediate Anesthesia Transfer of Care Note  Patient: Daniel Kline  Procedure(s) Performed: COLONOSCOPY WITH PROPOFOL (N/A )  Patient Location: PACU  Anesthesia Type:General  Level of Consciousness: sedated  Airway & Oxygen Therapy: Patient Spontanous Breathing and Patient connected to nasal cannula oxygen  Post-op Assessment: Report given to RN and Post -op Vital signs reviewed and stable  Post vital signs: Reviewed and stable  Last Vitals:  Vitals Value Taken Time  BP    Temp    Pulse    Resp    SpO2      Last Pain: There were no vitals filed for this visit.       Complications: No apparent anesthesia complications

## 2019-04-04 NOTE — Anesthesia Procedure Notes (Signed)
Date/Time: 04/04/2019 11:00 AM Performed by: Allean Found, CRNA Pre-anesthesia Checklist: Patient identified, Emergency Drugs available, Suction available, Patient being monitored and Timeout performed Patient Re-evaluated:Patient Re-evaluated prior to induction Oxygen Delivery Method: Nasal cannula Placement Confirmation: positive ETCO2

## 2019-04-04 NOTE — Anesthesia Preprocedure Evaluation (Signed)
Anesthesia Evaluation  Patient identified by MRN, date of birth, ID band Patient awake    Reviewed: Allergy & Precautions, NPO status , Patient's Chart, lab work & pertinent test results  History of Anesthesia Complications (+) PONV and history of anesthetic complications  Airway Mallampati: II  TM Distance: >3 FB Neck ROM: Full    Dental  (+) Upper Dentures, Lower Dentures   Pulmonary sleep apnea and Continuous Positive Airway Pressure Ventilation , neg COPD, former smoker,    breath sounds clear to auscultation- rhonchi (-) wheezing      Cardiovascular hypertension, (-) CAD, (-) Past MI, (-) Cardiac Stents and (-) CABG + dysrhythmias Atrial Fibrillation  Rhythm:Regular Rate:Normal - Systolic murmurs and - Diastolic murmurs    Neuro/Psych neg Seizures Anxiety negative neurological ROS     GI/Hepatic negative GI ROS, Neg liver ROS,   Endo/Other  diabetes, Oral Hypoglycemic Agents  Renal/GU Renal InsufficiencyRenal disease     Musculoskeletal  (+) Arthritis ,   Abdominal (+) + obese,   Peds  Hematology  (+) anemia ,   Anesthesia Other Findings Past Medical History: No date: Anemia     Comment:  H/O No date: Anxiety No date: Arthritis No date: Chronic kidney disease No date: Complication of anesthesia No date: CTCL (cutaneous T-cell lymphoma) (HCC) No date: Diabetes mellitus without complication (HCC) No date: Dysrhythmia     Comment:  A FLUTTER No date: Family history of adverse reaction to anesthesia     Comment:  PT WAS ADOPTED No date: HLD (hyperlipidemia) No date: HTN (hypertension) No date: HTN (hypertension) 2019: Hx of dysplastic nevus     Comment:  multiple sites 01/18/2018: Hx of squamous cell carcinoma     Comment:  R mid lateral forearm No date: MRSA (methicillin resistant Staphylococcus aureus)     Comment:  after back surgery No date: OSA (obstructive sleep apnea)     Comment:  USES  BIPAP No date: Polio 01/12/2010: POLIOMYELITIS     Comment:  Right arm affected No date: PONV (postoperative nausea and vomiting)   Reproductive/Obstetrics                             Anesthesia Physical Anesthesia Plan  ASA: III  Anesthesia Plan: General   Post-op Pain Management:    Induction: Intravenous  PONV Risk Score and Plan: 2 and Propofol infusion  Airway Management Planned: Natural Airway  Additional Equipment:   Intra-op Plan:   Post-operative Plan:   Informed Consent: I have reviewed the patients History and Physical, chart, labs and discussed the procedure including the risks, benefits and alternatives for the proposed anesthesia with the patient or authorized representative who has indicated his/her understanding and acceptance.     Dental advisory given  Plan Discussed with: CRNA and Anesthesiologist  Anesthesia Plan Comments:         Anesthesia Quick Evaluation

## 2019-04-04 NOTE — Interval H&P Note (Signed)
History and Physical Interval Note:  04/04/2019 9:43 AM  Daniel Kline  has presented today for surgery, with the diagnosis of F HX CP.  The various methods of treatment have been discussed with the patient and family. After consideration of risks, benefits and other options for treatment, the patient has consented to  Procedure(s): COLONOSCOPY WITH PROPOFOL (N/A) as a surgical intervention.  The patient's history has been reviewed, patient examined, no change in status, stable for surgery.  I have reviewed the patient's chart and labs.  Questions were answered to the patient's satisfaction.     [place], [place]

## 2019-04-04 NOTE — Op Note (Addendum)
[facility] Gastroenterology Patient Name: Daniel Kline Procedure Date: 04/04/2019 10:44 AM MRN: [id number] Account #: [id number] Date of Birth: 07-Jan-1946 Admit Type: Outpatient Age: 73 Room: [facility] ENDO ROOM 3 Gender: Male Note Status: Finalized Procedure:            Colonoscopy Indications:          Screening for colorectal malignant neoplasm Providers:            Benay Pike. Alice Reichert MD, MD Referring MD:         [name] (Referring MD) Medicines:            Propofol per Anesthesia Complications:        No immediate complications. Procedure:            Pre-Anesthesia Assessment:                       - The risks and benefits of the procedure and the                        sedation options and risks were discussed with the                        patient. All questions were answered and informed                        consent was obtained.                       - Patient identification and proposed procedure were                        verified prior to the procedure by the nurse. The                        procedure was verified in the procedure room.                       - ASA Grade Assessment: III - A patient with severe                        systemic disease.                       - After reviewing the risks and benefits, the patient                        was deemed in satisfactory condition to undergo the                        procedure.                       After obtaining informed consent, the colonoscope was                        passed under direct vision. Throughout the procedure,                        the patient's blood pressure, pulse, and oxygen  saturations were monitored continuously. The                        Colonoscope was introduced through the anus and                        advanced to the the cecum, identified by appendiceal                        orifice and ileocecal valve. The colonoscopy was                  performed without difficulty. The patient tolerated the                        procedure well. The quality of the bowel preparation                        was good. The ileocecal valve, appendiceal orifice, and                        rectum were photographed. Findings:      The perianal and digital rectal examinations were normal. Pertinent       negatives include normal sphincter tone and no palpable rectal lesions.      A few medium-mouthed diverticula were found in the left colon.      Two pedunculated polyps were found in the descending colon. The polyps       were 7 to 9 mm in size. These polyps were removed with a hot snare.       Resection and retrieval were complete.      A 5 mm polyp was found in the mid transverse colon. The polyp was       sessile. The polyp was removed with a cold snare. Resection and       retrieval were complete.      Non-bleeding internal hemorrhoids were found during retroflexion. The       hemorrhoids were Grade I (internal hemorrhoids that do not prolapse).      The exam was otherwise without abnormality. 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                       - Two 7 to 9 mm polyps in the descending colon, removed                        with a hot snare. Resected and retrieved.                       - One 5 mm polyp in the mid transverse colon, removed                        with a cold snare. Resected and retrieved.                       - Non-bleeding internal hemorrhoids.                       - The examination was otherwise normal. Recommendation:       - Patient has a contact number available for  emergencies. The signs and symptoms of potential                        delayed complications were discussed with the patient.                        Return to normal activities tomorrow. Written discharge                        instructions were provided to the patient.                       -  Resume previous diet.                       - Continue present medications.                       - Repeat colonoscopy is recommended for surveillance.                        The colonoscopy date will be determined after pathology                        results from today's exam become available for review.                       - Return to GI office PRN.                       - The findings and recommendations were discussed with                        the patient. Procedure Code(s):    --- Professional ---                       [phone], Colonoscopy, flexible; with removal of tumor(s),                        polyp(s), or other lesion(s) by snare technique Diagnosis Code(s):    --- Professional ---                       K57.30, Diverticulosis of large intestine without                        perforation or abscess without bleeding                       K64.0, First degree hemorrhoids                       Z12.11, Encounter for screening for malignant neoplasm                        of colon                       K63.5, Polyp of colon CPT copyright 2019 [employer]. All rights reserved. The codes documented in this report are preliminary and upon coder review may  be revised to meet current compliance requirements. Efrain Sella MD, MD 04/04/2019 12:08:10 PM This report  has been signed electronically. Number of Addenda: 0 Note Initiated On: 04/04/2019 10:44 AM Scope Withdrawal Time: 0 hours 7 minutes 28 seconds  Total Procedure Duration: 0 hours 20 minutes 57 seconds  Estimated Blood Loss: Estimated blood loss: none.      [facility]

## 2019-04-04 NOTE — Anesthesia Post-op Follow-up Note (Signed)
Anesthesia QCDR form completed.        

## 2019-04-05 ENCOUNTER — Other Ambulatory Visit: Payer: Self-pay | Admitting: Internal Medicine

## 2019-04-05 ENCOUNTER — Encounter: Payer: Self-pay | Admitting: Internal Medicine

## 2019-04-05 ENCOUNTER — Telehealth: Payer: Self-pay | Admitting: Internal Medicine

## 2019-04-05 DIAGNOSIS — E119 Type 2 diabetes mellitus without complications: Secondary | ICD-10-CM | POA: Diagnosis not present

## 2019-04-05 LAB — HM DIABETES EYE EXAM

## 2019-04-05 NOTE — Telephone Encounter (Signed)
Patient stated he is needing a letter from his provider in order to return to the gym to work out.   Patient would like to know if you are able to write something for him stating he can go back into the gym    C/B # [phone]

## 2019-04-06 ENCOUNTER — Encounter: Payer: Self-pay | Admitting: Internal Medicine

## 2019-04-06 LAB — SURGICAL PATHOLOGY

## 2019-04-06 NOTE — Telephone Encounter (Signed)
I have written a note. Let him know he is a high risk person, so he should be exceptionally careful if he is going to be inside at a gym (mask, distancing, cleaning all equipment before and after, etc)

## 2019-04-06 NOTE — Telephone Encounter (Signed)
I left a detailed message on patient's voice mail to let him know letter is ready and Dr.Letvak's comments.

## 2019-04-07 ENCOUNTER — Other Ambulatory Visit: Payer: Self-pay | Admitting: Internal Medicine

## 2019-04-09 NOTE — Telephone Encounter (Signed)
Name of Medication: Hydrocodone Name of Pharmacy: [facility] or Written Date and Quantity: 01-04-19 #60 Last Office Visit and Type: 09-06-18 Next Office Visit and Type: 05-18-19 Last Controlled Substance Agreement Date:  N/A Last UDS: N/A

## 2019-04-09 NOTE — Telephone Encounter (Signed)
Left message on VM per DPR advising pt to call the office and schedule an appt.

## 2019-04-13 ENCOUNTER — Encounter: Payer: Self-pay | Admitting: Internal Medicine

## 2019-04-13 DIAGNOSIS — G4733 Obstructive sleep apnea (adult) (pediatric): Secondary | ICD-10-CM | POA: Diagnosis not present

## 2019-04-17 DIAGNOSIS — M25441 Effusion, right hand: Secondary | ICD-10-CM | POA: Diagnosis not present

## 2019-04-17 DIAGNOSIS — G5602 Carpal tunnel syndrome, left upper limb: Secondary | ICD-10-CM | POA: Diagnosis not present

## 2019-04-17 DIAGNOSIS — E118 Type 2 diabetes mellitus with unspecified complications: Secondary | ICD-10-CM | POA: Diagnosis not present

## 2019-05-01 DIAGNOSIS — C84A0 Cutaneous T-cell lymphoma, unspecified, unspecified site: Secondary | ICD-10-CM | POA: Diagnosis not present

## 2019-05-03 DIAGNOSIS — M19042 Primary osteoarthritis, left hand: Secondary | ICD-10-CM | POA: Diagnosis not present

## 2019-05-03 DIAGNOSIS — R768 Other specified abnormal immunological findings in serum: Secondary | ICD-10-CM | POA: Diagnosis not present

## 2019-05-03 DIAGNOSIS — N183 Chronic kidney disease, stage 3 (moderate): Secondary | ICD-10-CM | POA: Diagnosis not present

## 2019-05-03 DIAGNOSIS — M25841 Other specified joint disorders, right hand: Secondary | ICD-10-CM | POA: Diagnosis not present

## 2019-05-03 DIAGNOSIS — M65949 Unspecified synovitis and tenosynovitis, unspecified hand: Secondary | ICD-10-CM | POA: Insufficient documentation

## 2019-05-03 DIAGNOSIS — M659 Synovitis and tenosynovitis, unspecified: Secondary | ICD-10-CM | POA: Insufficient documentation

## 2019-05-03 DIAGNOSIS — M11231 Other chondrocalcinosis, right wrist: Secondary | ICD-10-CM | POA: Diagnosis not present

## 2019-05-05 ENCOUNTER — Other Ambulatory Visit: Payer: Self-pay | Admitting: Internal Medicine

## 2019-05-07 NOTE — Telephone Encounter (Signed)
Name of Medication: Hydrocodone Name of Pharmacy:  [facility] or Written Date and Quantity: 04-09-19 #60 Last Office Visit and Type: 09-06-18 Next Office Visit and Type: 05-18-19 Last Controlled Substance Agreement Date: N/A Last UDS: N/A

## 2019-05-16 DIAGNOSIS — M659 Synovitis and tenosynovitis, unspecified: Secondary | ICD-10-CM | POA: Diagnosis not present

## 2019-05-16 DIAGNOSIS — N1831 Chronic kidney disease, stage 3a: Secondary | ICD-10-CM | POA: Diagnosis not present

## 2019-05-16 DIAGNOSIS — R768 Other specified abnormal immunological findings in serum: Secondary | ICD-10-CM | POA: Diagnosis not present

## 2019-05-18 ENCOUNTER — Other Ambulatory Visit: Payer: Self-pay

## 2019-05-18 ENCOUNTER — Ambulatory Visit (INDEPENDENT_AMBULATORY_CARE_PROVIDER_SITE_OTHER): Payer: Medicare Other | Admitting: Internal Medicine

## 2019-05-18 ENCOUNTER — Encounter: Payer: Self-pay | Admitting: Internal Medicine

## 2019-05-18 VITALS — BP 140/86 | HR 63 | Temp 98.7°F | Ht 66.0 in | Wt 245.0 lb

## 2019-05-18 DIAGNOSIS — Z23 Encounter for immunization: Secondary | ICD-10-CM | POA: Diagnosis not present

## 2019-05-18 DIAGNOSIS — G8929 Other chronic pain: Secondary | ICD-10-CM

## 2019-05-18 DIAGNOSIS — C84A0 Cutaneous T-cell lymphoma, unspecified, unspecified site: Secondary | ICD-10-CM

## 2019-05-18 DIAGNOSIS — M25561 Pain in right knee: Secondary | ICD-10-CM | POA: Diagnosis not present

## 2019-05-18 DIAGNOSIS — Z6837 Body mass index (BMI) 37.0-37.9, adult: Secondary | ICD-10-CM

## 2019-05-18 DIAGNOSIS — F1120 Opioid dependence, uncomplicated: Secondary | ICD-10-CM | POA: Insufficient documentation

## 2019-05-18 DIAGNOSIS — E1121 Type 2 diabetes mellitus with diabetic nephropathy: Secondary | ICD-10-CM

## 2019-05-18 MED ORDER — CLORAZEPATE DIPOTASSIUM 7.5 MG PO TABS: 7.5000 mg | ORAL_TABLET | Freq: Two times a day (BID) | ORAL | 0 refills | Status: DC | PRN

## 2019-05-18 NOTE — Addendum Note (Signed)
Addended by: [name] on: 05/18/2019 12:59 PM   Modules accepted: Orders

## 2019-05-18 NOTE — Progress Notes (Signed)
Subjective:    Patient ID: Daniel Kline, male    DOB: 1946/02/09, 73 y.o.   MRN: [id number]  HPI Here for follow up of chronic pain  Chronic pain issues persist Mostly in his knees Regularly takes the hydrocodone twice a day at most Tries to hold off if having a good day  Uses the clorazepate prn only For his anxiety--but not that often  Wonders about "Balance of Petra Kuba" Has seen it on TV and wonders about it (fruits and vegetables in tablets) Concerned about possible grapefruit in it Discussed this at some length  Needs recheck with oncologist about the lymphoma  Current Outpatient Medications on File Prior to Visit  Medication Sig Dispense Refill  . amLODipine (NORVASC) 10 MG tablet TAKE 1 TABLET BY MOUTH  DAILY (Patient taking differently: Take 10 mg by mouth daily before lunch. ) 90 tablet 3  . Apoaequorin (PREVAGEN PO) Take 1 tablet by mouth daily.    . carvedilol (COREG) 6.25 MG tablet TAKE 1 TABLET BY MOUTH 2  TIMES DAILY WITH A MEAL 180 tablet 3  . Cholecalciferol (VITAMIN D-3 PO) Take 10,000 Units by mouth daily.     Verneita Griffes Bark POWD Take 500 mg by mouth daily.     . clobetasol cream (TEMOVATE) 7.10 % Apply 1 application topically 2 (two) times daily as needed (irritation).     . clorazepate (TRANXENE) 7.5 MG tablet Take 1 tablet (7.5 mg total) by mouth 2 (two) times daily as needed for anxiety. 60 tablet 0  . Elastic Bandages & Supports (MEDICAL COMPRESSION STOCKINGS) MISC 2 Units by Does not apply route daily. 2 each 0  . EPINEPHrine 0.3 mg/0.3 mL IJ SOAJ injection Inject 0.3 mg into the muscle as needed for anaphylaxis.     . Ferrous Sulfate (IRON PO) Take 1 tablet by mouth at bedtime.    . furosemide (LASIX) 20 MG tablet TAKE ONE TABLET EVERY MORNING (Patient taking differently: Take 20 mg by mouth 2 (two) times a day. ) 90 tablet 1  . gabapentin (NEURONTIN) 300 MG capsule Take 1 capsule (300 mg total) by mouth 3 (three) times daily. 180 capsule 3  .  glipiZIDE (GLUCOTROL) 5 MG tablet TAKE ONE-HALF TABLET BY  MOUTH 2 TIMES DAILY BEFORE  A MEAL. (Patient taking differently: Take 2.5 mg by mouth 2 (two) times daily before a meal. ) 90 tablet 3  . HYDROcodone-acetaminophen (NORCO/VICODIN) 5-325 MG tablet TAKE 1 TABLET BY MOUTH TWICE DAILY AS NEEDED FOR PAIN 60 tablet 0  . hydrOXYzine (ATARAX/VISTARIL) 25 MG tablet TAKE 1 TABLET BY MOUTH EVERY 8 HOURS AS NEEDED. 90 tablet 0  . Interferon Alfa-2B (INTRON-A IJ) Inject 1 Dose as directed 3 (three) times a week.    . isosorbide mononitrate (IMDUR) 60 MG 24 hr tablet Take 60 mg by mouth daily before lunch.     Marland Kitchen JANUVIA 100 MG tablet TAKE 1 TABLET BY MOUTH  DAILY 90 tablet 3  . losartan (COZAAR) 100 MG tablet Take 1 tablet (100 mg total) by mouth daily. (Patient taking differently: Take 100 mg by mouth daily before lunch. ) 90 tablet 3  . metFORMIN (GLUCOPHAGE) 500 MG tablet TAKE 1 TABLET BY MOUTH TWO  TIMES DAILY (Patient taking differently: Take 500 mg by mouth 2 (two) times daily with a meal. ) 180 tablet 3  . Multiple Vitamins-Minerals (MULTIVITAL) tablet Take 1 tablet by mouth daily.      . ONE TOUCH ULTRA TEST test strip CHECK BLOOD  SUGAR 3 TIMES DAILY AS DIRECTED 100 each 1  . rosuvastatin (CRESTOR) 10 MG tablet Take 1 tablet (10 mg total) by mouth daily. (Patient taking differently: Take 10 mg by mouth daily before lunch. ) 90 tablet 0   No current facility-administered medications on file prior to visit.     Allergies  Allergen Reactions  . Bee Venom Anaphylaxis  . Oxycodone Other (See Comments)    Delusions  . Hydromorphone Other (See Comments)    hallucinating  . Zolpidem Other (See Comments)    Past Medical History:  Diagnosis Date  . Anemia    H/O  . Anxiety   . Arthritis   . Chronic kidney disease   . Complication of anesthesia   . 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 Diabetes mellitus without complication ([place])   . Dysrhythmia    A FLUTTER  . Family history of  adverse reaction to anesthesia    PT WAS ADOPTED  . HLD (hyperlipidemia)   . HTN (hypertension)   . HTN (hypertension)   . Hx of dysplastic nevus 2019   multiple sites  . 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 MRSA (methicillin resistant Staphylococcus aureus)    after back surgery  . OSA (obstructive sleep apnea)    USES BIPAP  . Polio   . 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 PONV (postoperative nausea and vomiting)     Past Surgical History:  Procedure Laterality Date  . BACK SURGERY     LUMBAR  . CARDIAC ELECTROPHYSIOLOGY STUDY AND ABLATION  2019  . COLONOSCOPY WITH PROPOFOL N/A 04/04/2019   Procedure: COLONOSCOPY WITH PROPOFOL;  Surgeon: Toledo, Benay Pike, MD;  Location: [facility] ENDOSCOPY;  Service: Gastroenterology;  Laterality: N/A;  . INCISION AND DRAINAGE     BACK-MRSA INFECTION AFTER BACK SURGERY  . MOUTH SURGERY    . right elbow surgery    . right knee surgery    . right shoulder surgery     from polio damage  . TONSILLECTOMY    . TOTAL HIP ARTHROPLASTY Bilateral 04/2016    Family History  Adopted: Yes    Social History   Socioeconomic History  . Marital status: Married    Spouse name: Not on file  . Number of children: 0  . Years of education: Not on file  . Highest education level: Not on file  Occupational History  . 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 Occupation: Cytogeneticist    Comment: Retired  . 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 Financial resource strain: Not hard at all  . Food insecurity    Worry: Never true    Inability: Never true  . Transportation needs    Medical: No    Non-medical: No  Tobacco Use  . Smoking status: Former Smoker    Types: Cigars    Quit date: 09/23/1990    Years since quitting: 28.6  . Smokeless tobacco: Former Systems developer    Quit date: 02/25/2006  Substance and Sexual Activity  .  Alcohol use: No    Alcohol/week: 0.0 standard drinks  . Drug use: No  . Sexual activity: Yes    Partners: Female  Lifestyle  . Physical activity    Days per week: Not on file    Minutes per session: Not on file  . Stress: Not on file  Relationships  .  Social Herbalist on phone: Patient refused    Gets together: Patient refused    Attends religious service: Patient refused    Active member of club or organization: Patient refused    Attends meetings of clubs or organizations: Patient refused    Relationship status: Patient refused  . Intimate partner violence    Fear of current or ex partner: No    Emotionally abused: No    Physically abused: No    Forced sexual activity: No  Other Topics Concern  . Not on file  Social History Narrative   Has living will   Wife is health care POA---then brother or sister   Would accept resuscitation attempts but no prolonged ventilation or tube feeds   Review of Systems  Generally sleeps okay--now on BiPap. "I can't sleep without it" Appetite is good     Objective:   Physical Exam  Constitutional: He appears well-developed. No distress.  Psychiatric: He has a normal mood and affect. His behavior is normal.           Assessment & Plan:

## 2019-05-18 NOTE — Assessment & Plan Note (Signed)
Has let it slip Working on better lifestyle Recheck next time

## 2019-05-18 NOTE — Assessment & Plan Note (Signed)
PDMP reviewed No concerns 

## 2019-05-18 NOTE — Assessment & Plan Note (Signed)
Due to go back for follow up

## 2019-05-18 NOTE — Assessment & Plan Note (Signed)
BMI ~38 with chronic arthritis, HTN, etc Is working on healthy eating, etc

## 2019-05-18 NOTE — Assessment & Plan Note (Signed)
Does okay with the hydrocodone Most is 2 per day

## 2019-05-20 LAB — PAIN MGMT, PROFILE 8 W/CONF, U
6 Acetylmorphine: NEGATIVE ng/mL
Alcohol Metabolites: NEGATIVE ng/mL (ref <500)
Alphahydroxyalprazolam: NEGATIVE ng/mL
Alphahydroxymidazolam: NEGATIVE ng/mL
Alphahydroxytriazolam: NEGATIVE ng/mL
Aminoclonazepam: NEGATIVE ng/mL
Amphetamines: NEGATIVE ng/mL
Benzodiazepines: POSITIVE ng/mL
Buprenorphine, Urine: NEGATIVE ng/mL
Cocaine Metabolite: NEGATIVE ng/mL
Codeine: NEGATIVE ng/mL
Creatinine: 39.9 mg/dL
Hydrocodone: 404 ng/mL
Hydromorphone: NEGATIVE ng/mL
Hydroxyethylflurazepam: NEGATIVE ng/mL
Lorazepam: NEGATIVE ng/mL
MDMA: NEGATIVE ng/mL
Marijuana Metabolite: NEGATIVE ng/mL
Morphine: NEGATIVE ng/mL
Nordiazepam: NEGATIVE ng/mL
Norhydrocodone: 262 ng/mL
Opiates: POSITIVE ng/mL
Oxazepam: 109 ng/mL
Oxidant: NEGATIVE ug/mL
Oxycodone: NEGATIVE ng/mL
Temazepam: NEGATIVE ng/mL
pH: 6.9 (ref 4.5–9.0)

## 2019-05-23 DIAGNOSIS — B351 Tinea unguium: Secondary | ICD-10-CM | POA: Diagnosis not present

## 2019-05-23 DIAGNOSIS — E1142 Type 2 diabetes mellitus with diabetic polyneuropathy: Secondary | ICD-10-CM | POA: Diagnosis not present

## 2019-05-28 ENCOUNTER — Other Ambulatory Visit: Payer: Self-pay | Admitting: Internal Medicine

## 2019-05-30 ENCOUNTER — Other Ambulatory Visit: Payer: Self-pay | Admitting: Internal Medicine

## 2019-06-12 ENCOUNTER — Other Ambulatory Visit: Payer: Self-pay | Admitting: Internal Medicine

## 2019-06-13 NOTE — Telephone Encounter (Signed)
Name of Medication:  Hydrocodone Name of Pharmacy: [facility] or Written Date and Quantity: 05-07-19 #60 Last Office Visit and Type: 05-18-19 Next Office Visit and Type: 08-23-19 Last Controlled Substance Agreement Date: 05-18-19 Last UDS: 05-18-19

## 2019-06-14 DIAGNOSIS — M069 Rheumatoid arthritis, unspecified: Secondary | ICD-10-CM | POA: Insufficient documentation

## 2019-06-14 DIAGNOSIS — M1711 Unilateral primary osteoarthritis, right knee: Secondary | ICD-10-CM | POA: Diagnosis not present

## 2019-06-14 DIAGNOSIS — Z79899 Other long term (current) drug therapy: Secondary | ICD-10-CM | POA: Diagnosis not present

## 2019-06-14 DIAGNOSIS — N1832 Chronic kidney disease, stage 3b: Secondary | ICD-10-CM | POA: Diagnosis not present

## 2019-06-14 DIAGNOSIS — M0600 Rheumatoid arthritis without rheumatoid factor, unspecified site: Secondary | ICD-10-CM | POA: Insufficient documentation

## 2019-06-26 ENCOUNTER — Other Ambulatory Visit: Payer: Self-pay | Admitting: Internal Medicine

## 2019-06-26 DIAGNOSIS — C84A0 Cutaneous T-cell lymphoma, unspecified, unspecified site: Secondary | ICD-10-CM | POA: Diagnosis not present

## 2019-07-12 DIAGNOSIS — G4733 Obstructive sleep apnea (adult) (pediatric): Secondary | ICD-10-CM | POA: Diagnosis not present

## 2019-07-13 ENCOUNTER — Other Ambulatory Visit: Payer: Self-pay | Admitting: Internal Medicine

## 2019-07-13 NOTE — Telephone Encounter (Signed)
Name of Medication: Hydrocodone Name of Pharmacy: [facility] or Written Date and Quantity: 06-13-19 #60 Last Office Visit and Type: 05-18-19 Next Office Visit and Type: 08-23-19 Last Controlled Substance Agreement Date: 05-18-19 Last UDS: 05-18-19

## 2019-07-23 ENCOUNTER — Other Ambulatory Visit: Payer: Self-pay | Admitting: Internal Medicine

## 2019-07-23 NOTE — Telephone Encounter (Signed)
Last filled 05-18-19 #60 Last OV 05-18-19 Next OV 08-23-19 [facility]

## 2019-07-26 DIAGNOSIS — M0600 Rheumatoid arthritis without rheumatoid factor, unspecified site: Secondary | ICD-10-CM | POA: Diagnosis not present

## 2019-08-23 ENCOUNTER — Ambulatory Visit (INDEPENDENT_AMBULATORY_CARE_PROVIDER_SITE_OTHER): Payer: Medicare Other | Admitting: Internal Medicine

## 2019-08-23 ENCOUNTER — Other Ambulatory Visit: Payer: Self-pay | Admitting: Internal Medicine

## 2019-08-23 ENCOUNTER — Other Ambulatory Visit: Payer: Self-pay

## 2019-08-23 ENCOUNTER — Encounter: Payer: Self-pay | Admitting: Internal Medicine

## 2019-08-23 VITALS — BP 158/88 | HR 88 | Temp 98.3°F | Ht 66.0 in | Wt 238.0 lb

## 2019-08-23 DIAGNOSIS — F1120 Opioid dependence, uncomplicated: Secondary | ICD-10-CM

## 2019-08-23 DIAGNOSIS — E1129 Type 2 diabetes mellitus with other diabetic kidney complication: Secondary | ICD-10-CM | POA: Diagnosis not present

## 2019-08-23 DIAGNOSIS — M545 Low back pain, unspecified: Secondary | ICD-10-CM

## 2019-08-23 DIAGNOSIS — IMO0002 Reserved for concepts with insufficient information to code with codable children: Secondary | ICD-10-CM

## 2019-08-23 DIAGNOSIS — I10 Essential (primary) hypertension: Secondary | ICD-10-CM | POA: Diagnosis not present

## 2019-08-23 DIAGNOSIS — E1165 Type 2 diabetes mellitus with hyperglycemia: Secondary | ICD-10-CM

## 2019-08-23 DIAGNOSIS — G8929 Other chronic pain: Secondary | ICD-10-CM

## 2019-08-23 LAB — POCT GLYCOSYLATED HEMOGLOBIN (HGB A1C): Hemoglobin A1C: 11.1 % — AB (ref 4.0–5.6)

## 2019-08-23 LAB — HM DIABETES FOOT EXAM

## 2019-08-23 MED ORDER — LIRAGLUTIDE 18 MG/3ML ~~LOC~~ SOPN: 1.2000 mg | PEN_INJECTOR | Freq: Every day | SUBCUTANEOUS | 11 refills | Status: DC

## 2019-08-23 MED ORDER — HYDROCODONE-ACETAMINOPHEN 5-325 MG PO TABS: ORAL_TABLET | ORAL | 0 refills | Status: DC

## 2019-08-23 NOTE — Telephone Encounter (Signed)
Last office visit 05/18/2019 for routine follow up.  Last refilled 05/30/2019 for #90 with no refills.  Patient has appointment today for 3 month follow up.

## 2019-08-23 NOTE — Assessment & Plan Note (Signed)
And knees Still needs the hydrocodone some--but trying to cut back

## 2019-08-23 NOTE — Assessment & Plan Note (Signed)
PDMP reviewed No concerns 

## 2019-08-23 NOTE — Assessment & Plan Note (Signed)
BP Readings from Last 3 Encounters:  08/23/19 (!) 162/84  05/18/19 140/86  04/04/19 (!) 143/84

## 2019-08-23 NOTE — Patient Instructions (Addendum)
Please start the liraglutide--0.1 ml for 2-3 days to be sure you don't have any problems with it. Then increase to 0.32ml daily Let me know if you are having any problems   DASH Eating Plan DASH stands for "Dietary Approaches to Stop Hypertension." The DASH eating plan is a healthy eating plan that has been shown to reduce high blood pressure (hypertension). It may also reduce your risk for type 2 diabetes, heart disease, and stroke. The DASH eating plan may also help with weight loss. What are tips for following this plan?  General guidelines  Avoid eating more than 2,300 mg (milligrams) of salt (sodium) a day. If you have hypertension, you may need to reduce your sodium intake to 1,500 mg a day.  Limit alcohol intake to no more than 1 drink a day for nonpregnant women and 2 drinks a day for men. One drink equals 12 oz of beer, 5 oz of wine, or 1 oz of hard liquor.  Work with your health care provider to maintain a healthy body weight or to lose weight. Ask what an ideal weight is for you.  Get at least 30 minutes of exercise that causes your heart to beat faster (aerobic exercise) most days of the week. Activities may include walking, swimming, or biking.  Work with your health care provider or diet and nutrition specialist (dietitian) to adjust your eating plan to your individual calorie needs. Reading food labels   Check food labels for the amount of sodium per serving. Choose foods with less than 5 percent of the Daily Value of sodium. Generally, foods with less than 300 mg of sodium per serving fit into this eating plan.  To find whole grains, look for the word "whole" as the first word in the ingredient list. Shopping  Buy products labeled as "low-sodium" or "no salt added."  Buy fresh foods. Avoid canned foods and premade or frozen meals. Cooking  Avoid adding salt when cooking. Use salt-free seasonings or herbs instead of table salt or sea salt. Check with your health care  provider or pharmacist before using salt substitutes.  Do not fry foods. Cook foods using healthy methods such as baking, boiling, grilling, and broiling instead.  Cook with heart-healthy oils, such as olive, canola, soybean, or sunflower oil. Meal planning  Eat a balanced diet that includes: ? 5 or more servings of fruits and vegetables each day. At each meal, try to fill half of your plate with fruits and vegetables. ? Up to 6-8 servings of whole grains each day. ? Less than 6 oz of lean meat, poultry, or fish each day. A 3-oz serving of meat is about the same size as a deck of cards. One egg equals 1 oz. ? 2 servings of low-fat dairy each day. ? A serving of nuts, seeds, or beans 5 times each week. ? Heart-healthy fats. Healthy fats called Omega-3 fatty acids are found in foods such as flaxseeds and coldwater fish, like sardines, salmon, and mackerel.  Limit how much you eat of the following: ? Canned or prepackaged foods. ? Food that is high in trans fat, such as fried foods. ? Food that is high in saturated fat, such as fatty meat. ? Sweets, desserts, sugary drinks, and other foods with added sugar. ? Full-fat dairy products.  Do not salt foods before eating.  Try to eat at least 2 vegetarian meals each week.  Eat more home-cooked food and less restaurant, buffet, and fast food.  When eating at  a restaurant, ask that your food be prepared with less salt or no salt, if possible. What foods are recommended? The items listed may not be a complete list. Talk with your dietitian about what dietary choices are best for you. Grains Whole-grain or whole-wheat bread. Whole-grain or whole-wheat pasta. Brown rice. [place] Morrow. Bulgur. Whole-grain and low-sodium cereals. Pita bread. Low-fat, low-sodium crackers. Whole-wheat flour tortillas. Vegetables Fresh or frozen vegetables (raw, steamed, roasted, or grilled). Low-sodium or reduced-sodium tomato and vegetable juice. Low-sodium or  reduced-sodium tomato sauce and tomato paste. Low-sodium or reduced-sodium canned vegetables. Fruits All fresh, dried, or frozen fruit. Canned fruit in natural juice (without added sugar). Meat and other protein foods Skinless chicken or [place]. Ground chicken or [place]. Pork with fat trimmed off. Fish and seafood. Egg whites. Dried beans, peas, or lentils. Unsalted nuts, nut butters, and seeds. Unsalted canned beans. Lean cuts of beef with fat trimmed off. Low-sodium, lean deli meat. Dairy Low-fat (1%) or fat-free (skim) milk. Fat-free, low-fat, or reduced-fat cheeses. Nonfat, low-sodium ricotta or cottage cheese. Low-fat or nonfat yogurt. Low-fat, low-sodium cheese. Fats and oils Soft margarine without trans fats. Vegetable oil. Low-fat, reduced-fat, or light mayonnaise and salad dressings (reduced-sodium). Canola, safflower, olive, soybean, and sunflower oils. Avocado. Seasoning and other foods Herbs. Spices. Seasoning mixes without salt. Unsalted popcorn and pretzels. Fat-free sweets. What foods are not recommended? The items listed may not be a complete list. Talk with your dietitian about what dietary choices are best for you. Grains Baked goods made with fat, such as croissants, muffins, or some breads. Dry pasta or rice meal packs. Vegetables Creamed or fried vegetables. Vegetables in a cheese sauce. Regular canned vegetables (not low-sodium or reduced-sodium). Regular canned tomato sauce and paste (not low-sodium or reduced-sodium). Regular tomato and vegetable juice (not low-sodium or reduced-sodium). Angie Fava. Olives. Fruits Canned fruit in a light or heavy syrup. Fried fruit. Fruit in cream or butter sauce. Meat and other protein foods Fatty cuts of meat. Ribs. Fried meat. Berniece Salines. Sausage. Bologna and other processed lunch meats. Salami. Fatback. Hotdogs. Bratwurst. Salted nuts and seeds. Canned beans with added salt. Canned or smoked fish. Whole eggs or egg yolks. Chicken or [place]  with skin. Dairy Whole or 2% milk, cream, and half-and-half. Whole or full-fat cream cheese. Whole-fat or sweetened yogurt. Full-fat cheese. Nondairy creamers. Whipped toppings. Processed cheese and cheese spreads. Fats and oils Butter. Stick margarine. Lard. Shortening. Ghee. Bacon fat. Tropical oils, such as coconut, palm kernel, or palm oil. Seasoning and other foods Salted popcorn and pretzels. Onion salt, garlic salt, seasoned salt, table salt, and sea salt. Worcestershire sauce. Tartar sauce. Barbecue sauce. Teriyaki sauce. Soy sauce, including reduced-sodium. Steak sauce. Canned and packaged gravies. Fish sauce. Oyster sauce. Cocktail sauce. Horseradish that you find on the shelf. Ketchup. Mustard. Meat flavorings and tenderizers. Bouillon cubes. Hot sauce and Tabasco sauce. Premade or packaged marinades. Premade or packaged taco seasonings. Relishes. Regular salad dressings. Where to find more information:  National Heart, Lung, and [place]: https://wilson-eaton.com/  American Heart Association: www.heart.org Summary  The DASH eating plan is a healthy eating plan that has been shown to reduce high blood pressure (hypertension). It may also reduce your risk for type 2 diabetes, heart disease, and stroke.  With the DASH eating plan, you should limit salt (sodium) intake to 2,300 mg a day. If you have hypertension, you may need to reduce your sodium intake to 1,500 mg a day.  When on the DASH eating plan, aim to eat  more fresh fruits and vegetables, whole grains, lean proteins, low-fat dairy, and heart-healthy fats.  Work with your health care provider or diet and nutrition specialist (dietitian) to adjust your eating plan to your individual calorie needs. This information is not intended to replace advice given to you by your health care provider. Make sure you discuss any questions you have with your health care provider. Document Revised: 07/08/2017 Document Reviewed:  07/19/2016 Elsevier Patient Education  2020 [employer].

## 2019-08-23 NOTE — Progress Notes (Signed)
Subjective:    Patient ID: Daniel Kline, male    DOB: [birth year], 74 y.o.   MRN: [id number]  HPI Here for follow up of chronic pain and diabetes  This visit occurred during the SARS-CoV-2 public health emergency.  Safety protocols were in place, including screening questions prior to the visit, additional usage of staff PPE, and extensive cleaning of exam room while observing appropriate contact time as indicated for disinfecting solutions.   Ongoing chronic back and knee pain Still uses the hydrocodone--but has run out Trying to cut back  Has been exercising Lost 7# Checks sugars bid---over 200 at night Lower in AM--- but still over 200 mostly Can't get [place] now--took last one this morning  Current Outpatient Medications on File Prior to Visit  Medication Sig Dispense Refill  . amLODipine (NORVASC) 10 MG tablet TAKE 1 TABLET BY MOUTH  DAILY (Patient taking differently: Take 10 mg by mouth daily before lunch. ) 90 tablet 3  . Apoaequorin (PREVAGEN PO) Take 1 tablet by mouth daily.    . carvedilol (COREG) 6.25 MG tablet TAKE 1 TABLET BY MOUTH 2  TIMES DAILY WITH A MEAL 180 tablet 3  . Cholecalciferol (VITAMIN D-3 PO) Take 10,000 Units by mouth daily.     Verneita Griffes Bark POWD Take 500 mg by mouth daily.     . clobetasol cream (TEMOVATE) 7.35 % Apply 1 application topically 2 (two) times daily as needed (irritation).     . clorazepate (TRANXENE) 7.5 MG tablet TAKE 1 TABLET (7.5 MG TOTAL) BY MOUTH 2 (TWO) TIMES DAILY AS NEEDED FOR ANXIETY. 60 tablet 0  . Elastic Bandages & Supports (MEDICAL COMPRESSION STOCKINGS) MISC 2 Units by Does not apply route daily. 2 each 0  . EPINEPHrine 0.3 mg/0.3 mL IJ SOAJ injection Inject 0.3 mg into the muscle as needed for anaphylaxis.     . Ferrous Sulfate (IRON PO) Take 1 tablet by mouth at bedtime.    . furosemide (LASIX) 20 MG tablet TAKE ONE TABLET EVERY MORNING (Patient taking differently: Take 20 mg by mouth 2 (two) times a day. ) 90 tablet 1   . gabapentin (NEURONTIN) 300 MG capsule Take 1 capsule (300 mg total) by mouth 3 (three) times daily. 180 capsule 3  . glipiZIDE (GLUCOTROL) 5 MG tablet TAKE ONE-HALF TABLET BY  MOUTH 2 TIMES DAILY BEFORE  A MEAL. (Patient taking differently: Take 2.5 mg by mouth 2 (two) times daily before a meal. ) 90 tablet 3  . HYDROcodone-acetaminophen (NORCO/VICODIN) 5-325 MG tablet TAKE ONE TABLET TWICE DAILY AS NEEDED FOR PAIN 60 tablet 0  . hydrOXYzine (ATARAX/VISTARIL) 25 MG tablet TAKE ONE TABLET EVERY EIGHT HOURS AS NEEDED 90 tablet 0  . Interferon Alfa-2B (INTRON-A IJ) Inject 1 Dose as directed 3 (three) times a week.    . isosorbide mononitrate (IMDUR) 60 MG 24 hr tablet Take 60 mg by mouth daily before lunch.     Marland Kitchen JANUVIA 100 MG tablet TAKE 1 TABLET BY MOUTH  DAILY 90 tablet 3  . leflunomide (ARAVA) 20 MG tablet Take by mouth.    . losartan (COZAAR) 100 MG tablet Take 1 tablet (100 mg total) by mouth daily. (Patient taking differently: Take 100 mg by mouth daily before lunch. ) 90 tablet 3  . metFORMIN (GLUCOPHAGE) 500 MG tablet TAKE 1 TABLET BY MOUTH TWO  TIMES DAILY (Patient taking differently: Take 500 mg by mouth 2 (two) times daily with a meal. ) 180 tablet 3  . Multiple  Vitamins-Minerals (MULTIVITAL) tablet Take 1 tablet by mouth daily.      . ONE TOUCH ULTRA TEST test strip CHECK BLOOD SUGAR 3 TIMES DAILY AS DIRECTED 100 each 1  . rosuvastatin (CRESTOR) 10 MG tablet Take 1 tablet (10 mg total) by mouth daily. (Patient taking differently: Take 10 mg by mouth daily before lunch. ) 90 tablet 0  . rosuvastatin (CRESTOR) 20 MG tablet TAKE ONE TABLET EVERY DAY 90 tablet 1   No current facility-administered medications on file prior to visit.    Allergies  Allergen Reactions  . Bee Venom Anaphylaxis  . Oxycodone Other (See Comments)    Delusions  . Hydromorphone Other (See Comments)    hallucinating  . Zolpidem Other (See Comments)    Past Medical History:  Diagnosis Date  . Anemia     H/O  . Anxiety   . Arthritis   . Chronic kidney disease   . Complication of anesthesia   . 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 Diabetes mellitus without complication ([place])   . Dysrhythmia    A FLUTTER  . Family history of adverse reaction to anesthesia    PT WAS ADOPTED  . HLD (hyperlipidemia)   . HTN (hypertension)   . HTN (hypertension)   . Hx of dysplastic nevus 2019   multiple sites  . 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 MRSA (methicillin resistant Staphylococcus aureus)    after back surgery  . OSA (obstructive sleep apnea)    USES BIPAP  . Polio   . 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 PONV (postoperative nausea and vomiting)     Past Surgical History:  Procedure Laterality Date  . BACK SURGERY     LUMBAR  . CARDIAC ELECTROPHYSIOLOGY STUDY AND ABLATION  2019  . COLONOSCOPY WITH PROPOFOL N/A 04/04/2019   Procedure: COLONOSCOPY WITH PROPOFOL;  Surgeon: Toledo, Benay Pike, MD;  Location: [facility] ENDOSCOPY;  Service: Gastroenterology;  Laterality: N/A;  . INCISION AND DRAINAGE     BACK-MRSA INFECTION AFTER BACK SURGERY  . MOUTH SURGERY    . right elbow surgery    . right knee surgery    . right shoulder surgery     from polio damage  . TONSILLECTOMY    . TOTAL HIP ARTHROPLASTY Bilateral 04/2016    Family History  Adopted: Yes    Social History   Socioeconomic History  . Marital status: Married    Spouse name: Not on file  . Number of children: 0  . Years of education: Not on file  . Highest education level: Not on file  Occupational History  . 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 Occupation: Cytogeneticist    Comment: Retired  . Occupation: Warden/ranger    Comment: Retired  Tobacco Use  . Smoking status: Former Smoker    Types: Cigars    Quit date: 09/23/1990    Years since quitting: 28.9  . Smokeless tobacco: Former Systems developer    Quit date:  02/25/2006  Substance and Sexual Activity  . Alcohol use: No    Alcohol/week: 0.0 standard drinks  . Drug use: No  . Sexual activity: Yes    Partners: Female  Other Topics Concern  . Not on file  Social History Narrative   Has living will   Wife is health care POA---then brother or sister   Would accept resuscitation attempts but no prolonged  ventilation or tube feeds   Social Determinants of Health   Financial Resource Strain:   . Difficulty of Paying Living Expenses: Not on file  Food Insecurity:   . Worried About Charity fundraiser in the Last Year: Not on file  . Ran Out of Food in the Last Year: Not on file  Transportation Needs:   . Lack of Transportation (Medical): Not on file  . Lack of Transportation (Non-Medical): Not on file  Physical Activity:   . Days of Exercise per Week: Not on file  . Minutes of Exercise per Session: Not on file  Stress:   . Feeling of Stress : Not on file  Social Connections:   . Frequency of Communication with Friends and Family: Not on file  . Frequency of Social Gatherings with Friends and Family: Not on file  . Attends Religious Services: Not on file  . Active Member of Clubs or Organizations: Not on file  . Attends Archivist Meetings: Not on file  . Marital Status: Not on file  Intimate Partner Violence:   . Fear of Current or Ex-Partner: Not on file  . Emotionally Abused: Not on file  . Physically Abused: Not on file  . Sexually Abused: Not on file   Review of Systems No foot numbness or pain Appetite is okay    Objective:   Physical Exam  Constitutional: He appears well-developed. No distress.  Neck: No thyromegaly present.  Cardiovascular: Normal rate, regular rhythm, normal heart sounds and intact distal pulses. Exam reveals no gallop.  No murmur heard. Respiratory: Effort normal and breath sounds normal. No respiratory distress. He has no wheezes. He has no rales.  Musculoskeletal:        General: No edema.    Lymphadenopathy:    He has no cervical adenopathy.  Neurological:  Normal sensation in feet  Skin:  No foot lesions  Psychiatric: He has a normal mood and affect. His behavior is normal.           Assessment & Plan:

## 2019-08-23 NOTE — Assessment & Plan Note (Addendum)
Sugars all over 200 Not able to get [place] anymore Will check A1c---?plan to change to liraglutide  Lab Results  Component Value Date   HGBA1C 11.1 (A) 08/23/2019   Discussed that most of the improvement has to be with his lifestyle Will go ahead with liraglutide

## 2019-08-27 DIAGNOSIS — B351 Tinea unguium: Secondary | ICD-10-CM | POA: Diagnosis not present

## 2019-08-27 DIAGNOSIS — Z79899 Other long term (current) drug therapy: Secondary | ICD-10-CM | POA: Diagnosis not present

## 2019-08-27 DIAGNOSIS — E1142 Type 2 diabetes mellitus with diabetic polyneuropathy: Secondary | ICD-10-CM | POA: Diagnosis not present

## 2019-08-27 DIAGNOSIS — M0600 Rheumatoid arthritis without rheumatoid factor, unspecified site: Secondary | ICD-10-CM | POA: Diagnosis not present

## 2019-09-02 ENCOUNTER — Other Ambulatory Visit: Payer: Self-pay | Admitting: Internal Medicine

## 2019-09-06 ENCOUNTER — Other Ambulatory Visit: Payer: Self-pay | Admitting: Internal Medicine

## 2019-09-11 ENCOUNTER — Other Ambulatory Visit: Payer: Self-pay | Admitting: Internal Medicine

## 2019-09-18 ENCOUNTER — Other Ambulatory Visit: Payer: Self-pay | Admitting: Internal Medicine

## 2019-09-26 ENCOUNTER — Other Ambulatory Visit: Payer: Self-pay | Admitting: Internal Medicine

## 2019-09-26 DIAGNOSIS — R768 Other specified abnormal immunological findings in serum: Secondary | ICD-10-CM | POA: Diagnosis not present

## 2019-09-26 DIAGNOSIS — M0600 Rheumatoid arthritis without rheumatoid factor, unspecified site: Secondary | ICD-10-CM | POA: Diagnosis not present

## 2019-09-26 DIAGNOSIS — M1711 Unilateral primary osteoarthritis, right knee: Secondary | ICD-10-CM | POA: Diagnosis not present

## 2019-09-26 DIAGNOSIS — Z79899 Other long term (current) drug therapy: Secondary | ICD-10-CM | POA: Diagnosis not present

## 2019-09-26 DIAGNOSIS — N1832 Chronic kidney disease, stage 3b: Secondary | ICD-10-CM | POA: Diagnosis not present

## 2019-09-26 NOTE — Telephone Encounter (Signed)
Name of Medication: Hydrocodone Name of Pharmacy: [facility] or Written Date and Quantity: 08-23-19 #60 Last Office Visit and Type: 08-23-19 Next Office Visit and Type: 11-22-19 Last Controlled Substance Agreement Date: 05-18-19 Last UDS: 05-18-19

## 2019-09-28 ENCOUNTER — Other Ambulatory Visit: Payer: Self-pay | Admitting: Internal Medicine

## 2019-10-01 ENCOUNTER — Other Ambulatory Visit: Payer: Self-pay | Admitting: Internal Medicine

## 2019-10-10 DIAGNOSIS — G4733 Obstructive sleep apnea (adult) (pediatric): Secondary | ICD-10-CM | POA: Diagnosis not present

## 2019-10-26 ENCOUNTER — Other Ambulatory Visit: Payer: Self-pay | Admitting: Internal Medicine

## 2019-10-26 NOTE — Telephone Encounter (Signed)
Name of Medication: Hydrocodone Name of Pharmacy: [facility] or Written Date and Quantity: 09-26-19 #60 Last Office Visit and Type: 08-23-19 Next Office Visit and Type: 11-22-19 Last Controlled Substance Agreement Date: 05-18-19 Last UDS: 05-18-19

## 2019-11-08 ENCOUNTER — Other Ambulatory Visit: Payer: Self-pay | Admitting: Internal Medicine

## 2019-11-08 NOTE — Telephone Encounter (Signed)
Last filled 07-23-19 #60 Last OV 08-23-19 Next OV 11-22-19 [facility]

## 2019-11-22 ENCOUNTER — Ambulatory Visit (INDEPENDENT_AMBULATORY_CARE_PROVIDER_SITE_OTHER): Payer: Medicare Other | Admitting: Internal Medicine

## 2019-11-22 ENCOUNTER — Other Ambulatory Visit: Payer: Self-pay

## 2019-11-22 ENCOUNTER — Encounter: Payer: Self-pay | Admitting: Internal Medicine

## 2019-11-22 VITALS — BP 160/100 | HR 98 | Temp 97.6°F | Ht 66.0 in | Wt 232.0 lb

## 2019-11-22 DIAGNOSIS — E1129 Type 2 diabetes mellitus with other diabetic kidney complication: Secondary | ICD-10-CM

## 2019-11-22 DIAGNOSIS — E1165 Type 2 diabetes mellitus with hyperglycemia: Secondary | ICD-10-CM | POA: Diagnosis not present

## 2019-11-22 DIAGNOSIS — G8929 Other chronic pain: Secondary | ICD-10-CM

## 2019-11-22 DIAGNOSIS — F1120 Opioid dependence, uncomplicated: Secondary | ICD-10-CM | POA: Diagnosis not present

## 2019-11-22 DIAGNOSIS — M1711 Unilateral primary osteoarthritis, right knee: Secondary | ICD-10-CM

## 2019-11-22 DIAGNOSIS — I10 Essential (primary) hypertension: Secondary | ICD-10-CM

## 2019-11-22 DIAGNOSIS — IMO0002 Reserved for concepts with insufficient information to code with codable children: Secondary | ICD-10-CM

## 2019-11-22 DIAGNOSIS — M545 Low back pain: Secondary | ICD-10-CM | POA: Diagnosis not present

## 2019-11-22 LAB — POCT GLYCOSYLATED HEMOGLOBIN (HGB A1C): Hemoglobin A1C: 6.8 % — AB (ref 4.0–5.6)

## 2019-11-22 MED ORDER — EPINEPHRINE 0.3 MG/0.3ML IJ SOAJ: 0.3000 mg | INTRAMUSCULAR | 5 refills | Status: DC | PRN

## 2019-11-22 NOTE — Assessment & Plan Note (Signed)
PDMP reviewed No concerns 

## 2019-11-22 NOTE — Progress Notes (Signed)
Subjective:    Patient ID: Daniel Kline, male    DOB: Sep 02, 1945, 74 y.o.   MRN: [id number]  HPI Here for follow up of chronic pain and diabetes out of control This visit occurred during the SARS-CoV-2 public health emergency.  Safety protocols were in place, including screening questions prior to the visit, additional usage of staff PPE, and extensive cleaning of exam room while observing appropriate contact time as indicated for disinfecting solutions.   He is tolerating the liraglutide Still on the 1.2mg  It has cut his appetite ---has lost some weight Some nausea at first---that did go away Checks sugars bid ----AM 100-140 mostly, slightly higher at night  Trying to work out  Doing stationery bike--6 miles  Doing work in yard  Back still hurts--though not as bad as in the past Hydrocodone only once a day mostly--tries to avoid if he can  Current Outpatient Medications on File Prior to Visit  Medication Sig Dispense Refill  . amLODipine (NORVASC) 10 MG tablet TAKE 1 TABLET BY MOUTH  DAILY 90 tablet 3  . carvedilol (COREG) 6.25 MG tablet TAKE 1 TABLET BY MOUTH 2  TIMES DAILY WITH A MEAL 180 tablet 3  . Cholecalciferol (VITAMIN D-3 PO) Take 10,000 Units by mouth daily.     Verneita Griffes Bark POWD Take 500 mg by mouth daily.     . clobetasol cream (TEMOVATE) 6.30 % Apply 1 application topically 2 (two) times daily as needed (irritation).     . clorazepate (TRANXENE) 7.5 MG tablet TAKE 1 TABLET (7.5 MG TOTAL) BY MOUTH 2 (TWO) TIMES DAILY AS NEEDED FOR ANXIETY. 60 tablet 0  . Elastic Bandages & Supports (MEDICAL COMPRESSION STOCKINGS) MISC 2 Units by Does not apply route daily. 2 each 0  . EPINEPHrine 0.3 mg/0.3 mL IJ SOAJ injection Inject 0.3 mg into the muscle as needed for anaphylaxis.     . Ferrous Sulfate (IRON PO) Take 1 tablet by mouth at bedtime.    . furosemide (LASIX) 20 MG tablet TAKE ONE TABLET EVERY MORNING (Patient taking differently: Take 20 mg by mouth 2 (two) times a  day. ) 90 tablet 1  . gabapentin (NEURONTIN) 300 MG capsule TAKE 1 CAPSULE BY MOUTH 3  TIMES DAILY 270 capsule 3  . glipiZIDE (GLUCOTROL) 5 MG tablet TAKE ONE-HALF TABLET BY  MOUTH 2 TIMES DAILY BEFORE  A MEAL. (Patient taking differently: Take 2.5 mg by mouth 2 (two) times daily before a meal. ) 90 tablet 3  . hydrOXYzine (ATARAX/VISTARIL) 25 MG tablet TAKE ONE TABLET EVERY EIGHT HOURS AS NEEDED 90 tablet 3  . isosorbide mononitrate (IMDUR) 60 MG 24 hr tablet Take 60 mg by mouth daily before lunch.     . leflunomide (ARAVA) 20 MG tablet Take by mouth.    . liraglutide (VICTOZA) 18 MG/3ML SOPN Inject 0.2 mLs (1.2 mg total) into the skin daily. 2 pen 11  . losartan (COZAAR) 100 MG tablet Take 1 tablet (100 mg total) by mouth daily. (Patient taking differently: Take 100 mg by mouth daily before lunch. ) 90 tablet 3  . metFORMIN (GLUCOPHAGE) 500 MG tablet TAKE 1 TABLET BY MOUTH TWO  TIMES DAILY 180 tablet 3  . Multiple Vitamins-Minerals (MULTIVITAL) tablet Take 1 tablet by mouth daily.      Glory Rosebush ULTRA test strip CHECK BLOOD SUGAR 3 TIMES DAILY AS DIRECTED 100 each 1  . rosuvastatin (CRESTOR) 10 MG tablet Take 1 tablet (10 mg total) by mouth daily. (Patient taking  differently: Take 10 mg by mouth daily before lunch. ) 90 tablet 0  . rosuvastatin (CRESTOR) 20 MG tablet TAKE ONE TABLET EVERY DAY 90 tablet 3   No current facility-administered medications on file prior to visit.    Allergies  Allergen Reactions  . Bee Venom Anaphylaxis  . Oxycodone Other (See Comments)    Delusions  . Hydromorphone Other (See Comments)    hallucinating  . Zolpidem Other (See Comments)    Past Medical History:  Diagnosis Date  . Anemia    H/O  . Anxiety   . Arthritis   . Chronic kidney disease   . Complication of anesthesia   . 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 Diabetes mellitus without complication ([place])   . Dysrhythmia    A FLUTTER  . Family history of adverse reaction to anesthesia     PT WAS ADOPTED  . HLD (hyperlipidemia)   . HTN (hypertension)   . HTN (hypertension)   . Hx of dysplastic nevus 2019   multiple sites  . 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 MRSA (methicillin resistant Staphylococcus aureus)    after back surgery  . OSA (obstructive sleep apnea)    USES BIPAP  . Polio   . 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 PONV (postoperative nausea and vomiting)     Past Surgical History:  Procedure Laterality Date  . BACK SURGERY     LUMBAR  . CARDIAC ELECTROPHYSIOLOGY STUDY AND ABLATION  2019  . COLONOSCOPY WITH PROPOFOL N/A 04/04/2019   Procedure: COLONOSCOPY WITH PROPOFOL;  Surgeon: Toledo, Benay Pike, MD;  Location: [facility] ENDOSCOPY;  Service: Gastroenterology;  Laterality: N/A;  . INCISION AND DRAINAGE     BACK-MRSA INFECTION AFTER BACK SURGERY  . MOUTH SURGERY    . right elbow surgery    . right knee surgery    . right shoulder surgery     from polio damage  . TONSILLECTOMY    . TOTAL HIP ARTHROPLASTY Bilateral 04/2016    Family History  Adopted: Yes    Social History   Socioeconomic History  . Marital status: Married    Spouse name: Not on file  . Number of children: 0  . Years of education: Not on file  . Highest education level: Not on file  Occupational History  . 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 Occupation: Cytogeneticist    Comment: Retired  . Occupation: Warden/ranger    Comment: Retired  Tobacco Use  . Smoking status: Former Smoker    Types: Cigars    Quit date: 09/23/1990    Years since quitting: 29.1  . Smokeless tobacco: Former Systems developer    Quit date: 02/25/2006  Substance and Sexual Activity  . Alcohol use: No    Alcohol/week: 0.0 standard drinks  . Drug use: No  . Sexual activity: Yes    Partners: Female  Other Topics Concern  . Not on file  Social History Narrative   Has living will   Wife is health care  POA---then brother or sister   Would accept resuscitation attempts but no prolonged ventilation or tube feeds   Social Determinants of Health   Financial Resource Strain:   . Difficulty of Paying Living Expenses:   Food Insecurity:   . Worried About Charity fundraiser in the Last Year:   . [place] in the Last  Year:   Transportation Needs:   . Film/video editor (Medical):   Marland Kitchen Lack of Transportation (Non-Medical):   Physical Activity:   . Days of Exercise per Week:   . Minutes of Exercise per Session:   Stress:   . Feeling of Stress :   Social Connections:   . Frequency of Communication with Friends and Family:   . Frequency of Social Gatherings with Friends and Family:   . Attends Religious Services:   . Active Member of Clubs or Organizations:   . Attends Archivist Meetings:   Marland Kitchen Marital Status:   Intimate Partner Violence:   . Fear of Current or Ex-Partner:   . Emotionally Abused:   Marland Kitchen Physically Abused:   . Sexually Abused:    Review of Systems Has had TKR replacement due  Right by Dr Marry Guan    Objective:   Physical Exam  Constitutional: No distress.  Psychiatric: He has a normal mood and affect. His behavior is normal.           Assessment & Plan:

## 2019-11-22 NOTE — Assessment & Plan Note (Signed)
Lab Results  Component Value Date   HGBA1C 6.8 (A) 11/22/2019   Marked improvement on the liraglutide Will continue the glipizide and metformin

## 2019-11-22 NOTE — Assessment & Plan Note (Signed)
Is due to have TKR by Dr Marry Guan A1c is now under the needed 7.5% May be able to proceed now

## 2019-11-22 NOTE — Assessment & Plan Note (Signed)
Some better Trying to limit the hydrocodone

## 2019-11-22 NOTE — Assessment & Plan Note (Signed)
BP Readings from Last 3 Encounters:  11/22/19 (!) 160/100  08/23/19 (!) 158/88  05/18/19 140/86   Recheck about the same on the left He will monitor at home If stays up, would add another medication

## 2019-11-22 NOTE — Patient Instructions (Signed)
Please try to check your blood pressure at home once a week or every 2 weeks. Let me know if it is consistently over 140/90.

## 2019-11-26 ENCOUNTER — Other Ambulatory Visit: Payer: Self-pay | Admitting: Internal Medicine

## 2019-11-26 DIAGNOSIS — E1142 Type 2 diabetes mellitus with diabetic polyneuropathy: Secondary | ICD-10-CM | POA: Diagnosis not present

## 2019-11-26 DIAGNOSIS — Z79899 Other long term (current) drug therapy: Secondary | ICD-10-CM | POA: Diagnosis not present

## 2019-11-26 DIAGNOSIS — B351 Tinea unguium: Secondary | ICD-10-CM | POA: Diagnosis not present

## 2019-11-26 DIAGNOSIS — M0600 Rheumatoid arthritis without rheumatoid factor, unspecified site: Secondary | ICD-10-CM | POA: Diagnosis not present

## 2019-11-26 DIAGNOSIS — R768 Other specified abnormal immunological findings in serum: Secondary | ICD-10-CM | POA: Diagnosis not present

## 2019-11-27 ENCOUNTER — Other Ambulatory Visit: Payer: Self-pay | Admitting: Internal Medicine

## 2019-11-27 NOTE — Telephone Encounter (Signed)
The system will not allow me to refuse the refill. He said he stopped this at his last OV last week.

## 2019-12-24 DIAGNOSIS — N1832 Chronic kidney disease, stage 3b: Secondary | ICD-10-CM | POA: Diagnosis not present

## 2019-12-24 DIAGNOSIS — M0600 Rheumatoid arthritis without rheumatoid factor, unspecified site: Secondary | ICD-10-CM | POA: Diagnosis not present

## 2019-12-24 DIAGNOSIS — R768 Other specified abnormal immunological findings in serum: Secondary | ICD-10-CM | POA: Diagnosis not present

## 2019-12-24 DIAGNOSIS — Z79899 Other long term (current) drug therapy: Secondary | ICD-10-CM | POA: Diagnosis not present

## 2019-12-24 DIAGNOSIS — M1711 Unilateral primary osteoarthritis, right knee: Secondary | ICD-10-CM | POA: Diagnosis not present

## 2019-12-28 ENCOUNTER — Other Ambulatory Visit: Payer: Self-pay | Admitting: Internal Medicine

## 2019-12-28 NOTE — Telephone Encounter (Signed)
Note from 11/28/2019, Elige Ko, CMA stated pt reported that he had stopped medication, but looks like Dr Silvio Pate did refill... please advise    Name of [place] Name of [facility] or Written Date and Quantity:11-28-19 #60 Last Office Visit and Type:08-23-19 Next Office Visit and Type:11-22-19 Last Controlled Substance Agreement Date:05-18-19 Last UDS:05-18-19

## 2019-12-31 NOTE — Telephone Encounter (Signed)
Refill request Hydroxyzine Last refill 08/24/19 #90/3 Last office visit 11/22/19

## 2020-01-03 ENCOUNTER — Other Ambulatory Visit: Payer: Self-pay | Admitting: Internal Medicine

## 2020-01-15 NOTE — Discharge Instructions (Signed)
Instructions after Total Knee Replacement   Zalaya Astarita P. Nathasha Fiorillo, Jr., M.D.     Dept. of Orthopaedics & Sports Medicine  [facility]  [address]  [place], [place]  [address]  Phone: [phone]   Fax: [phone]    DIET: Drink plenty of non-alcoholic fluids. Resume your normal diet. Include foods high in fiber.  ACTIVITY:  You may use crutches or a walker with weight-bearing as tolerated, unless instructed otherwise. You may be weaned off of the walker or crutches by your Physical Therapist.  Do NOT place pillows under the knee. Anything placed under the knee could limit your ability to straighten the knee.   Continue doing gentle exercises. Exercising will reduce the pain and swelling, increase motion, and prevent muscle weakness.   Please continue to use the TED compression stockings for 6 weeks. You may remove the stockings at night, but should reapply them in the morning. Do not drive or operate any equipment until instructed.  WOUND CARE:  Continue to use the PolarCare or ice packs periodically to reduce pain and swelling. You may bathe or shower after the staples are removed at the first office visit following surgery.  MEDICATIONS: You may resume your regular medications. Please take the pain medication as prescribed on the medication. Do not take pain medication on an empty stomach. You have been given a prescription for a blood thinner (Lovenox or Coumadin). Please take the medication as instructed. (NOTE: After completing a 2 week course of Lovenox, take one Enteric-coated aspirin once a day. This along with elevation will help reduce the possibility of phlebitis in your operated leg.) Do not drive or drink alcoholic beverages when taking pain medications.  CALL THE OFFICE FOR: Temperature above 101 degrees Excessive bleeding or drainage on the dressing. Excessive swelling, coldness, or paleness of the toes. Persistent nausea and vomiting.  FOLLOW-UP:  You  should have an appointment to return to the office in 10-14 days after surgery. Arrangements have been made for continuation of Physical Therapy (either home therapy or outpatient therapy).   [facility] Department Directory         [email or web address]       [email or web address]          Cardiology  Appointments: [place] - [phone] [place] - [phone]  Endocrinology  Appointments: [place] - [phone] [place] - [phone]  Gastroenterology  Appointments: [place] - [phone] [place] - [phone]        General Surgery   Appointments: [place] - [phone]  Internal Medicine/Family Medicine  Appointments: [place] - [phone] [place] - [phone] [place] - [phone]  Metabolic and Weigh Loss Surgery  Appointments: [place] - [phone]        Neurology  Appointments: [place] - [phone] [place] - [phone]  Neurosurgery  Appointments: [place] - [phone]  Obstetrics & Gynecology  Appointments: [place] - [phone] [place] - [phone]        Pediatrics  Appointments: [place] - [phone] [place] - [phone]  Physiatry  Appointments: [place] -[phone]  Physical Therapy  Appointments: [place] - [phone] [place] - [phone]        Podiatry  Appointments: [place] - [phone] [place] - [phone]  Pulmonology  Appointments: [place] - [phone]  Rheumatology  Appointments: [place] - [phone]        [place] Location: [facility]  [address] [place], [place]  [address]  [place] Location: [facility] [address] [place], [place]  [address]  [place] Location: [facility] [address] [place], [place]  [address]    

## 2020-01-17 ENCOUNTER — Encounter
Admission: RE | Admit: 2020-01-17 | Discharge: 2020-01-17 | Disposition: A | Discharge status: Home / Self Care | Payer: Medicare Other | Source: Ambulatory Visit | Attending: Orthopedic Surgery | Admitting: Orthopedic Surgery

## 2020-01-17 ENCOUNTER — Other Ambulatory Visit: Payer: Self-pay

## 2020-01-17 DIAGNOSIS — M25561 Pain in right knee: Secondary | ICD-10-CM | POA: Diagnosis not present

## 2020-01-17 NOTE — Patient Instructions (Signed)
COVID TESTING Date: January 24, 2020 Testing site:  [place] [facility] Hours:  8:33 am - 1:00 pm Once you are tested, you are asked to stay quarantined (avoiding public places) until after your surgery.   Your procedure is scheduled on: January 28, 2020 Monday Report to Day Surgery on the 2nd floor of the [facility]. To find out your arrival time, please call [phone] between 1PM - 3PM on: January 25, 2020 Friday  REMEMBER: Instructions that are not followed completely may result in serious medical risk, up to and including death; or upon the discretion of your surgeon and anesthesiologist your surgery may need to be rescheduled.  Do not eat food after midnight the night before surgery.  No gum chewing, lozengers or hard candies.  You may however, drink CLEAR liquids up to 2 hours before you are scheduled to arrive for your surgery. Do not drink anything within 2 hours of your scheduled arrival time.  Clear liquids include: - water    Do NOT drink anything that is not on this list.  Type 1 and Type 2 diabetics should only drink water.  Gatorade DRINK:  Complete drinking 2 hours prior to scheduled arrival time.  TAKE THESE MEDICATIONS THE MORNING OF SURGERY WITH A SIP OF WATER: Amlodipine Carvedilol Rosuvastatin Isosorbide leflunomide  Stop Metformin  2 days prior to surgery. Last dose January 25, 2020  Follow recommendations from Cardiologist, Pulmonologist or PCP regarding stopping Aspirin, Coumadin, Plavix, Eliquis, Pradaxa, or Pletal.  Stop Anti-inflammatories (NSAIDS) such as Advil, Aleve, Ibuprofen, Motrin, Naproxen, Naprosyn and Aspirin based products such as Excedrin, Goodys Powder, BC Powder. (May take Tylenol or Acetaminophen if needed.)  Stop ANY OVER THE COUNTER supplements until after surgery. (May continue Vitamin D, Vitamin B, and multivitamin.)  No Alcohol for 24 hours before or after surgery.  No Smoking including  e-cigarettes for 24 hours prior to surgery.  No chewable tobacco products for at least 6 hours prior to surgery.  No nicotine patches on the day of surgery.  Do not use any "recreational" drugs for at least a week prior to your surgery.  Please be advised that the combination of cocaine and anesthesia may have negative outcomes, up to and including death. If you test positive for cocaine, your surgery will be cancelled.  On the morning of surgery brush your teeth with toothpaste and water, you may rinse your mouth with mouthwash if you wish. Do not swallow any toothpaste or mouthwash.  Do not wear jewelry, make-up, hairpins, clips or nail polish.  Do not wear lotions, powders, or perfumes deodorant and aftershave  Do not shave 48 hours prior to surgery.   Contact lenses, hearing aids and dentures may not be worn into surgery.  Do not bring valuables to the hospital. [facility] is not responsible for any missing/lost belongings or valuables.   Use CHG Soap as directed on instruction sheet.  Bring your C-PAP to the hospital   Notify your doctor if there is any change in your medical condition (cold, fever, infection).  Wear comfortable clothing (specific to your surgery type) to the hospital.  Plan for stool softeners for home use; pain medications have a tendency to cause constipation. You can also help prevent constipation by eating foods high in fiber such as fruits and vegetables and drinking plenty of fluids as your diet allows.  After surgery, you can help prevent lung complications by doing breathing exercises.  Take deep breaths  and cough every 1-2 hours. Your doctor may order a device called an Incentive Spirometer to help you take deep breaths. When coughing or sneezing, hold a pillow firmly against your incision with both hands. This is called "splinting." Doing this helps protect your incision. It also decreases belly discomfort.  If you are being admitted to the  hospital overnight, leave your suitcase in the car. After surgery it may be brought to your room.  If you are being discharged the day of surgery, you will not be allowed to drive home. You will need a responsible adult (18 years or older) to drive you home and stay with you that night.   If you are taking public transportation, you will need to have a responsible adult (18 years or older) with you. Please confirm with your physician that it is acceptable to use public transportation.   Please call the [facility] Dept. at [phone] if you have any questions about these instructions.  Visitation Policy:  Patients undergoing a surgery or procedure may have one family member or support person with them as long as that person is not COVID-19 positive or experiencing its symptoms.  That person may remain in the waiting area during the procedure.  Children under 69 years of age may have both parents or legal guardians with them during their procedure.  Inpatient Visitation Update:   Two designated support people may visit a patient during visiting hours 7 am to 8 pm. It must be the same two designated people for the duration of the patient stay. The visitors may come and go during the day, and there is no switching out to have different visitors. A mask must be worn at all times, including in the patient room.  Children under 5 years of age:  a total of 4 designated visitors for the child's entire stay are allowed. Only 2 in the room at a time and only one staying overnight at a time. The overnight guest can now rotate during the child's hospital stay.  As a reminder, masks are still required for all [facility] team members, patients and visitors in all [facility] facilities.   Systemwide, no visitors 17 or younger.

## 2020-01-18 ENCOUNTER — Telehealth: Payer: Self-pay | Admitting: Cardiovascular Disease

## 2020-01-18 NOTE — Telephone Encounter (Signed)
Primary Cardiologist:Timothy Rockey Situ, MD  Chart reviewed as part of pre-operative protocol coverage. Because of Daniel Kline's past medical history and time since last visit, he/she will require a follow-up visit in order to better assess preoperative cardiovascular risk.  Pre-op covering staff: - Please schedule appointment and call patient to inform them. - Please contact requesting surgeon's office via preferred method (i.e, phone, fax) to inform them of need for appointment prior to surgery.  If applicable, this message will also be routed to pharmacy pool and/or primary cardiologist for input on holding anticoagulant/antiplatelet agent as requested below so that this information is available at time of patient's appointment.   Deberah Pelton, NP  01/18/2020, 8:44 AM

## 2020-01-18 NOTE — Telephone Encounter (Signed)
needs appt- LM2CB 6-11 cell&hm

## 2020-01-18 NOTE — Telephone Encounter (Signed)
   [facility] Pre-operative Risk Assessment    HEARTCARE STAFF: - Please ensure there is not already an duplicate clearance open for this procedure. - Under Visit Info/Reason for Call, type in Other and utilize the format Clearance MM/DD/YY or Clearance TBD. Do not use dashes or single digits. - If request is for dental extraction, please clarify the # of teeth to be extracted.  Request for surgical clearance:  1. What type of surgery is being performed? RIGHT TOTAL KNEE ARTHROPLASTY  2. When is this surgery scheduled? 01/28/20  3. What type of clearance is required (medical clearance vs. Pharmacy clearance to hold med vs. Both)? NOT LISTED  4. Are there any medications that need to be held prior to surgery and how long? NOT LISTED  Practice name and name of physician performing surgery? [facility]  5. What is the office phone number? [phone]   7.   What is the office fax number? [phone]  8.   Anesthesia type (None, local, MAC, general) ? NOT LISTED   Daniel Kline 01/18/2020, 7:40 AM  _________________________________________________________________   (provider comments below)

## 2020-01-21 ENCOUNTER — Encounter
Admission: RE | Admit: 2020-01-21 | Discharge: 2020-01-21 | Disposition: A | Discharge status: Home / Self Care | Payer: Medicare Other | Source: Ambulatory Visit | Attending: Orthopedic Surgery | Admitting: Orthopedic Surgery

## 2020-01-21 ENCOUNTER — Other Ambulatory Visit: Payer: Self-pay

## 2020-01-21 ENCOUNTER — Other Ambulatory Visit: Payer: Self-pay | Admitting: Internal Medicine

## 2020-01-21 DIAGNOSIS — Z01818 Encounter for other preprocedural examination: Secondary | ICD-10-CM | POA: Insufficient documentation

## 2020-01-21 DIAGNOSIS — Z01810 Encounter for preprocedural cardiovascular examination: Secondary | ICD-10-CM | POA: Diagnosis not present

## 2020-01-21 LAB — URINALYSIS, ROUTINE W REFLEX MICROSCOPIC
Bacteria, UA: NONE SEEN
Bilirubin Urine: NEGATIVE
Glucose, UA: NEGATIVE mg/dL
Hgb urine dipstick: NEGATIVE
Ketones, ur: NEGATIVE mg/dL
Leukocytes,Ua: NEGATIVE
Nitrite: NEGATIVE
Protein, ur: 30 mg/dL — AB
Specific Gravity, Urine: 1.008 (ref 1.005–1.030)
pH: 6 (ref 5.0–8.0)

## 2020-01-21 LAB — COMPREHENSIVE METABOLIC PANEL
ALT: 21 U/L (ref 0–44)
AST: 19 U/L (ref 15–41)
Albumin: 4.3 g/dL (ref 3.5–5.0)
Alkaline Phosphatase: 69 U/L (ref 38–126)
Anion gap: 10 (ref 5–15)
BUN: 20 mg/dL (ref 8–23)
CO2: 27 mmol/L (ref 22–32)
Calcium: 9 mg/dL (ref 8.9–10.3)
Chloride: 101 mmol/L (ref 98–111)
Creatinine, Ser: 1.64 mg/dL — ABNORMAL HIGH (ref 0.61–1.24)
GFR calc Af Amer: 47 mL/min — ABNORMAL LOW (ref >60)
GFR calc non Af Amer: 40 mL/min — ABNORMAL LOW (ref >60)
Glucose, Bld: 134 mg/dL — ABNORMAL HIGH (ref 70–99)
Potassium: 3.5 mmol/L (ref 3.5–5.1)
Sodium: 138 mmol/L (ref 135–145)
Total Bilirubin: 0.5 mg/dL (ref 0.3–1.2)
Total Protein: 7.2 g/dL (ref 6.5–8.1)

## 2020-01-21 LAB — PROTIME-INR
INR: 1 (ref 0.8–1.2)
Prothrombin Time: 12.3 seconds (ref 11.4–15.2)

## 2020-01-21 LAB — APTT: aPTT: 38 seconds — ABNORMAL HIGH (ref 24–36)

## 2020-01-21 LAB — SURGICAL PCR SCREEN
MRSA, PCR: NEGATIVE
Staphylococcus aureus: NEGATIVE

## 2020-01-21 LAB — TYPE AND SCREEN
ABO/RH(D): O POS
Antibody Screen: NEGATIVE

## 2020-01-21 LAB — CBC
HCT: 40 % (ref 39.0–52.0)
Hemoglobin: 13.8 g/dL (ref 13.0–17.0)
MCH: 28.6 pg (ref 26.0–34.0)
MCHC: 34.5 g/dL (ref 30.0–36.0)
MCV: 83 fL (ref 80.0–100.0)
Platelets: 182 10*3/uL (ref 150–400)
RBC: 4.82 MIL/uL (ref 4.22–5.81)
RDW: 14.6 % (ref 11.5–15.5)
WBC: 5.5 10*3/uL (ref 4.0–10.5)
nRBC: 0 % (ref 0.0–0.2)

## 2020-01-21 LAB — HEMOGLOBIN A1C
Hgb A1c MFr Bld: 7.8 % — ABNORMAL HIGH (ref 4.8–5.6)
Mean Plasma Glucose: 177.16 mg/dL

## 2020-01-21 LAB — SEDIMENTATION RATE: Sed Rate: 17 mm/hr (ref 0–20)

## 2020-01-21 LAB — C-REACTIVE PROTEIN: CRP: 0.5 mg/dL (ref <1.0)

## 2020-01-21 NOTE — Telephone Encounter (Addendum)
Pt called back, he states he cannot go to [facility] for appt, it is too far. I cannot anything this week at [place], [facility]ICE OR [facility] OFFICE this week. We do have opening at the need office. Pt statats that [facility] office is too far. Pt states that "he will call his doctors office and discuss this with them". Notified pt that he could do this and see what they say. Pt states that his phone is going dead and hung up.   Will await call back from pt if needed.

## 2020-01-21 NOTE — Telephone Encounter (Signed)
Last filled 11-11-19 #60 Last OV 11-22-19 Next OV 03-10-20 [facility]

## 2020-01-21 NOTE — Telephone Encounter (Signed)
LM [facility] NEEDS APPT FOR [facility]ARNCE

## 2020-01-22 DIAGNOSIS — G4733 Obstructive sleep apnea (adult) (pediatric): Secondary | ICD-10-CM | POA: Diagnosis not present

## 2020-01-22 LAB — URINE CULTURE: Culture: NO GROWTH

## 2020-01-22 NOTE — Telephone Encounter (Signed)
Appt scheduled 01/23/2020 2:30 PM OFFICE VISIT

## 2020-01-23 ENCOUNTER — Ambulatory Visit: Payer: Medicare Other | Admitting: Family Medicine

## 2020-01-23 ENCOUNTER — Encounter: Payer: Self-pay | Admitting: Family Medicine

## 2020-01-23 VITALS — BP 165/97 | HR 78 | Ht 66.0 in | Wt 232.4 lb

## 2020-01-23 DIAGNOSIS — G4733 Obstructive sleep apnea (adult) (pediatric): Secondary | ICD-10-CM

## 2020-01-23 DIAGNOSIS — Z01818 Encounter for other preprocedural examination: Secondary | ICD-10-CM

## 2020-01-23 DIAGNOSIS — E1121 Type 2 diabetes mellitus with diabetic nephropathy: Secondary | ICD-10-CM

## 2020-01-23 DIAGNOSIS — I10 Essential (primary) hypertension: Secondary | ICD-10-CM

## 2020-01-23 DIAGNOSIS — E782 Mixed hyperlipidemia: Secondary | ICD-10-CM

## 2020-01-23 DIAGNOSIS — I483 Typical atrial flutter: Secondary | ICD-10-CM

## 2020-01-23 MED ORDER — CARVEDILOL 12.5 MG PO TABS
12.5000 mg | ORAL_TABLET | Freq: Two times a day (BID) | ORAL | 3 refills | Status: DC
Start: 2020-01-23 — End: 2021-04-03

## 2020-01-23 NOTE — Progress Notes (Addendum)
Cardiology Office Note  Date: 01/23/2020   ID: Daniel Kline, DOB 04/05/1946, MRN [id number]  PCP:  [name], MD  Cardiologist:  Ida Rogue, MD Electrophysiologist:  None   Chief Complaint: Needs cardiac clearance for right total knee arthroplasty scheduled for 01/28/2020 Dr. Skip Estimable   History of Present Illness: Daniel Kline is a 74 y.o. male with a history of hypertension, atrial flutter, type II DM, CKD stage II, OSA on CPAP, arthritis, cutaneous T-cell lymphoma, HLD, HTN.  Last office visit with Dr. Rockey Situ for preop clearance for knee surgery.  He was deemed an acceptable risk with no further testing needed.  History of atrial flutter s/p ablation 04/2018. Incidentally found to be in atrial flutter after presenting for knee replacement. He reported having successful ablation.   Not on anticoagulation.  Diabetes with nephropathy last A1c 6.7, CKD stage II stable creatinine 1.4-1.6.  Hypertension.  Blood pressure had improved after 10 pound weight loss.  Hydralazine was stopped.  He was on CPAP at home doing well and compliant with therapy.   He presents here today complaining of no issues other than right knee pain.  He denies any progressive anginal or exertional symptoms, palpitations or arrhythmias, orthostatic symptoms, PND, orthopnea, bleeding issues, claudication-like symptoms, DVT or PE-like symptoms, or lower extremity edema.  Here for cardiac clearance for right total knee arthroplasty by Dr. Marry Guan at [facility]. His blood pressure is elevated today and has evidence of previously elevated pressures on previous checks in our system.  Past Medical History:  Diagnosis Date  . Anemia    H/O  . Anxiety   . Arthritis   . Chronic kidney disease   . Complication of anesthesia   . 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 Diabetes mellitus without complication ([place])   . Dysrhythmia    A FLUTTER  . Family history of  adverse reaction to anesthesia    PT WAS ADOPTED  . HLD (hyperlipidemia)   . HTN (hypertension)   . HTN (hypertension)   . Hx of dysplastic nevus 2019   multiple sites  . 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 MRSA (methicillin resistant Staphylococcus aureus)    after back surgery  . OSA (obstructive sleep apnea)    USES BIPAP  . Polio   . 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 PONV (postoperative nausea and vomiting)     Past Surgical History:  Procedure Laterality Date  . BACK SURGERY     LUMBAR  . CARDIAC ELECTROPHYSIOLOGY STUDY AND ABLATION  2019  . COLONOSCOPY WITH PROPOFOL N/A 04/04/2019   Procedure: COLONOSCOPY WITH PROPOFOL;  Surgeon: Toledo, Benay Pike, MD;  Location: [facility] ENDOSCOPY;  Service: Gastroenterology;  Laterality: N/A;  . INCISION AND DRAINAGE     BACK-MRSA INFECTION AFTER BACK SURGERY  . MOUTH SURGERY    . right elbow surgery    . right knee surgery    . right shoulder surgery     from polio damage  . TONSILLECTOMY    . TOTAL HIP ARTHROPLASTY Bilateral 04/2016    Current Outpatient Medications  Medication Sig Dispense Refill  . amLODipine (NORVASC) 10 MG tablet TAKE 1 TABLET BY MOUTH  DAILY (Patient taking differently: Take 10 mg by mouth daily. ) 90 tablet 3  . Cholecalciferol (VITAMIN D-3 PO) Take 10,000 Units by mouth daily.     . clobetasol cream (TEMOVATE) 0.05 %  Apply 1 application topically 2 (two) times daily as needed (irritation).     . clorazepate (TRANXENE) 7.5 MG tablet TAKE 1 TABLET (7.5 MG TOTAL) BY MOUTH 2 (TWO) TIMES DAILY AS NEEDED FOR ANXIETY. 60 tablet 0  . Elastic Bandages & Supports (MEDICAL COMPRESSION STOCKINGS) MISC 2 Units by Does not apply route daily. 2 each 0  . EPINEPHrine 0.3 mg/0.3 mL IJ SOAJ injection Inject 0.3 mLs (0.3 mg total) into the muscle as needed for anaphylaxis. 1 each 5  . Ferrous Sulfate (IRON PO) Take 1 tablet by mouth at bedtime.    . furosemide (LASIX) 40 MG tablet  Take 40 mg by mouth 2 (two) times daily.    Marland Kitchen gabapentin (NEURONTIN) 300 MG capsule TAKE 1 CAPSULE BY MOUTH 3  TIMES DAILY (Patient taking differently: Take 300 mg by mouth 3 (three) times daily. ) 270 capsule 3  . glipiZIDE (GLUCOTROL) 5 MG tablet TAKE ONE-HALF TABLET BY  MOUTH 2 TIMES DAILY BEFORE  A MEAL. (Patient taking differently: Take 2.5 mg by mouth 2 (two) times daily before a meal. ) 90 tablet 3  . HYDROcodone-acetaminophen (NORCO/VICODIN) 5-325 MG tablet TAKE 1 TABLET BY MOUTH TWICE DAILY AS NEEDED FOR PAIN 60 tablet 0  . hydrOXYzine (ATARAX/VISTARIL) 25 MG tablet TAKE ONE TABLET EVERY EIGHT HOURS AS NEEDED (Patient taking differently: Take 25 mg by mouth every 8 (eight) hours as needed for anxiety. ) 90 tablet 3  . isosorbide mononitrate (IMDUR) 60 MG 24 hr tablet Take 60 mg by mouth daily before lunch.     . leflunomide (ARAVA) 20 MG tablet Take 20 mg by mouth daily.     Marland Kitchen liraglutide (VICTOZA) 18 MG/3ML SOPN Inject 0.2 mLs (1.2 mg total) into the skin daily. 2 pen 11  . losartan (COZAAR) 100 MG tablet Take 1 tablet (100 mg total) by mouth daily. (Patient taking differently: Take 100 mg by mouth daily before lunch. ) 90 tablet 3  . metFORMIN (GLUCOPHAGE) 500 MG tablet TAKE 1 TABLET BY MOUTH TWO  TIMES DAILY (Patient taking differently: Take 500 mg by mouth 2 (two) times daily with a meal. ) 180 tablet 3  . Multiple Vitamins-Minerals (MULTIVITAL) tablet Take 1 tablet by mouth daily.      Glory Rosebush ULTRA test strip CHECK BLOOD SUGAR 3 TIMES DAILY AS DIRECTED 100 each 1  . rosuvastatin (CRESTOR) 20 MG tablet TAKE ONE TABLET EVERY DAY (Patient taking differently: Take 20 mg by mouth daily. ) 90 tablet 3  . timolol (BETIMOL) 0.25 % ophthalmic solution Place 1-2 drops into both eyes 2 (two) times daily.    . carvedilol (COREG) 12.5 MG tablet Take 1 tablet (12.5 mg total) by mouth 2 (two) times daily. 180 tablet 3   No current facility-administered medications for this visit.   Allergies:   Bee venom, Oxycodone, Hydromorphone, and Zolpidem   Social History: The patient  reports that he quit smoking about 29 years ago. His smoking use included cigars. He quit smokeless tobacco use about 13 years ago. He reports that he does not drink alcohol and does not use drugs.   Family History: The patient's family history is not on file. He was adopted.   ROS:  Please see the history of present illness. Otherwise, complete review of systems is positive for none.  All other systems are reviewed and negative.   Physical Exam: VS:  BP (!) 164/100   Pulse 78   Ht 5\' 6"  (1.676 m)   Wt 232  lb 6.4 oz (105.4 kg)   SpO2 96%   BMI 37.51 kg/m , BMI Body mass index is 37.51 kg/m.  Wt Readings from Last 3 Encounters:  01/23/20 232 lb 6.4 oz (105.4 kg)  01/17/20 230 lb (104.3 kg)  11/22/19 232 lb (105.2 kg)    General: Patient appears comfortable at rest. Neck: Supple, no elevated JVP or carotid bruits, no thyromegaly. Lungs: Clear to auscultation, nonlabored breathing at rest. Cardiac: Regular rate and rhythm, no S3 or significant systolic murmur, no pericardial rub. Extremities: No pitting edema, distal pulses 2+. Skin: Warm and dry. Musculoskeletal: No kyphosis. Neuropsychiatric: Alert and oriented x3, affect grossly appropriate.  ECG:  February 05, 2019 EKG sinus rhythm with first-degree AV block rate of 64  Recent Labwork: 01/21/2020: ALT 21; AST 19; BUN 20; Creatinine, Ser 1.64; Hemoglobin 13.8; Platelets 182; Potassium 3.5; Sodium 138     Component Value Date/Time   CHOL 95 01/09/2018 1637   TRIG 175.0 (H) 01/09/2018 1637   HDL 27.60 (L) 01/09/2018 1637   CHOLHDL 3 01/09/2018 1637   VLDL 35.0 01/09/2018 1637   LDLCALC 33 01/09/2018 1637   LDLDIRECT 94.0 01/27/2017 1037    Other Studies Reviewed Today:   Assessment and Plan:  1. Preoperative clearance   2. Typical atrial flutter ([place])   3. Essential hypertension   4. Diabetes mellitus with nephropathy ([place])   5. OSA  (obstructive sleep apnea)    1. Preoperative clearance Here for cardiac clearance for a right total knee arthroplasty by Dr. Marry Guan at [facility] on January 28, 2020.  Advised the patient we cannot clear him yet until his blood pressure is much better controlled.  See notes below under #3.  Patient states he recently had some preop screening lab work at [facility].  Revised cardiac risk index score = 0 placing him at a 0.4% perioperative risk of major cardiac event.  From a cardiac standpoint patient would be able to undergo right total knee replacement surgery under general anesthesia.  We currently need to get his blood pressure under control before he can be cleared for surgery.  2. Typical atrial flutter (HCC) Last EKG at Dr. Donivan Scull office 02/05/2019 showed sinus rhythm first-degree AV block rate of 64.   3. Essential hypertension  Blood pressure elevated today on arrival initial blood pressure 164/100.  Recheck in right arm 165/97.  Increase carvedilol to 12.5 mg p.o. twice daily.  Start the first dose this evening.  Start taking your blood pressure starting tomorrow and Friday.  Call back with blood pressure measurements on Friday around noon.  If blood pressure remains elevated may not proceed with surgery until blood pressure is better controlled.  Discussed this with the patient and he verbalizes understanding.  Advised him to call his surgeon and make him aware of the circumstances.  Continue amlodipine at 10 mg daily.  Continue Lasix 40 mg daily.  Continue losartan 100 mg daily.  His blood pressure has been consistently elevated since January 2021.  4. Diabetes mellitus with nephropathy ([place]) Not well controlled.  Recent hemoglobin A1c 7.8%.  However, improved from 5 months ago when it was 11.1%.  Managed by PCP.  5. OSA (obstructive sleep apnea) On CPAP and compliant.  6.  Hyperlipidemia Last direct LDL 94 on 01/27/2017.  Continue Crestor 20 mg  daily.  Medication Adjustments/Labs and Tests Ordered: Current medicines are reviewed at length with the patient today.  Concerns regarding medicines are outlined above.   Disposition:  Follow-up with [facility] or APP in 6 months  Signed, Levell July, NP 01/23/2020 4:49 PM     [facility] at [place], [place], [place] [address] Phone: [phone]; Fax: [phone]

## 2020-01-23 NOTE — Patient Instructions (Addendum)
Medication Instructions:   Your physician has recommended you make the following change in your medication:   Increase your carvedilol to 12.5 mg by mouth twice daily  Continue other medications the same  Labwork:  NONE  Testing/Procedures:  NONE  Follow-Up:  Your physician recommends that you schedule a follow-up appointment in: 6 months (office).   Any Other Special Instructions Will Be Listed Below (If Applicable).  Please call the office on Friday with your home blood pressure readings.   If you need a refill on your cardiac medications before your next appointment, please call your pharmacy.

## 2020-01-24 ENCOUNTER — Other Ambulatory Visit
Admission: RE | Admit: 2020-01-24 | Discharge: 2020-01-24 | Disposition: A | Discharge status: Home / Self Care | Payer: Medicare Other | Source: Ambulatory Visit | Attending: Orthopedic Surgery | Admitting: Orthopedic Surgery

## 2020-01-24 ENCOUNTER — Other Ambulatory Visit: Payer: Self-pay

## 2020-01-24 DIAGNOSIS — Z01812 Encounter for preprocedural laboratory examination: Secondary | ICD-10-CM | POA: Diagnosis not present

## 2020-01-24 DIAGNOSIS — Z20822 Contact with and (suspected) exposure to covid-19: Secondary | ICD-10-CM | POA: Insufficient documentation

## 2020-01-24 LAB — SARS CORONAVIRUS 2 (TAT 6-24 HRS): SARS Coronavirus 2: NEGATIVE

## 2020-01-24 NOTE — Pre-Procedure Instructions (Signed)
Patient seen by cardiology 01/23/20 for clearance. Patient was denied clearance due to elevated blood pressure.   Copied from [facility] cardiology note 01/23/20  3. Essential hypertension  Blood pressure elevated today on arrival initial blood pressure 164/100.  Recheck in right arm 165/97.  Increase carvedilol to 12.5 mg p.o. twice daily.  Start the first dose this evening.  Start taking your blood pressure starting tomorrow and Friday.  Call back with blood pressure measurements on Friday around noon.  If blood pressure remains elevated may not proceed with surgery until blood pressure is better controlled.  Discussed this with the patient and he verbalizes understanding.  Advised him to call his surgeon and make him aware of the circumstances.  Continue amlodipine at 10 mg daily.  Continue Lasix 40 mg daily.  Continue losartan 100 mg daily.  His blood pressure has been consistently elevated since January 2021  Signed, Levell July, NP 01/23/2020 4:49 PM     [facility] [place] [place], [place],  [address] Phone: [phone]; Fax: [phone]

## 2020-01-25 ENCOUNTER — Telehealth: Payer: Self-pay

## 2020-01-25 NOTE — Telephone Encounter (Signed)
He is clear from cardiology standpoint to have surgery since BP has improved. Thanks

## 2020-01-25 NOTE — Telephone Encounter (Signed)
Fwd to NP for review.

## 2020-01-25 NOTE — Telephone Encounter (Signed)
New message    Pt c/o BP issue: STAT if pt c/o blurred vision, one-sided weakness or slurred speech  1. What are your last 5 BP readings?  6/17 132/76 before breakfast  132/78 before dinner  6/18 128/78 after breakfast   Patient was suppose to call back and give this to Katina Dung for surgical clearance -  Fax clearance to [facility]  With Dr Marry Guan office  [phone] (fax)   Patient needs a determination by 1pm today

## 2020-01-25 NOTE — Telephone Encounter (Signed)
This note faxed to Dr. Marry Guan office now.

## 2020-01-27 ENCOUNTER — Encounter: Payer: Self-pay | Admitting: Orthopedic Surgery

## 2020-01-27 MED ORDER — CEFAZOLIN SODIUM-DEXTROSE 2-4 GM/100ML-% IV SOLN
2.0000 g | INTRAVENOUS | Status: AC
  Administered 2020-01-28: 2 g via INTRAVENOUS

## 2020-01-27 MED ORDER — TRANEXAMIC ACID-NACL 1000-0.7 MG/100ML-% IV SOLN
1000.0000 mg | INTRAVENOUS | Status: AC
  Administered 2020-01-28: 1000 mg via INTRAVENOUS

## 2020-01-27 NOTE — H&P (Signed)
ORTHOPAEDIC HISTORY & PHYSICAL  Progress Notes Gwenlyn Fudge, [place] - 01/17/2020 8:45 AM EDT [facility] Chief Complaint:   Chief Complaint  Patient presents with  . Knee Pain  H & P RIGHT KNEE   History of Present Illness:   Daniel Kline is a 74 y.o. male that presents to clinic today for his preoperative history and evaluation. Patient presents unaccompanied. The patient is scheduled to undergo a right total knee arthroplasty on 01/28/20 by Dr. Marry Guan. His pain began many years ago. The pain is located primarily along the medial aspect of the knee. He describes his pain as worse with weightbearing. He reports associated swelling of the knee. He denies associated numbness or tingling, denies locking or giving way of the knee.   The patient's symptoms have progressed to the point that they decrease his quality of life. The patient has previously undergone conservative treatment including NSAIDS and injections to the knee without adequate control of his symptoms.  Denies history of blood clots, has had 2 lumbar surgeries but he is unsure if any hardware was placed there. Sees Dr. Rockey Situ for cardiology and has received clearance.  Patient states he has hallucinations when taking oxycodone, but is able to tolerate hydrocodone without issue.   Past Medical, Surgical, Family, Social History, Allergies, Medications:   Past Medical History:  Past Medical History:  Diagnosis Date  . Anemia, unspecified  . Chicken pox  . Chronic kidney disease (CKD), stage III (moderate) (CMS-HCC) 09/27/2015  Last Assessment & Plan: On losartan BP is good  . Colon polyp  . Cutaneous T-cell lymphoma (CMS-HCC)  . Diabetes mellitus type 2 with complications (CMS-HCC) 3/97/6734  . DJD (degenerative joint disease) of lumbar spine  . Essential hypertension, benign  . History of poliomyelitis 1948  Age 30, right arm with slight limitations  . Hypercholesteremia   . Infection and inflammatory reaction due to other internal orthopedic device, implant, and graft (CMS-HCC)  . Methicillin resistant Staphylococcus aureus in conditions classified elsewhere and of unspecified site  . Neuropathy due to secondary diabetes (CMS-HCC)  . Obstructive sleep apnea  Uses CPAP  . Osteoarthrosis, unspecified whether generalized or localized, unspecified site  . Primary osteoarthritis of right knee 11/08/2018   Past Surgical History:  Past Surgical History:  Procedure Laterality Date  . Cardiac ablation 2019  [facility]  . COLONOSCOPY 04/04/2019  Tubular adenoma of the colonFHx CP/Repeat 10yrs/TKT  . L2-3, L3-4 TLIF, interbody fusion, and posterolateral fusion 02/23/2011  Dr. Mauri Pole  . Left total hip arthroplasty 10/26/2011  Dr. Mauri Pole  . Right elbow surgery  . right knee surgery  . Right shoulder surgery  . Right total hip arthroplasty 04/26/2016  Dr. Magda Bernheim  . SPLENECTOMY   Current Medications:  Current Outpatient Medications  Medication Sig Dispense Refill  . amLODIPine (NORVASC) 10 MG tablet Take 10 mg by mouth once daily  . carvediloL (COREG) 3.125 MG tablet Take 3.125 mg by mouth 2 (two) times daily with meals  . clobetasoL (TEMOVATE) 0.05 % ointment APPLY TO LESIONS ONCE OR TWICE A DAY FORTWO WEEKS ON, ONE WEEK OFF AND REPEAT 60 g 3  . clorazepate (TRANXENE) 7.5 MG tablet Take 7.5 mg by mouth daily as needed.  . diclofenac (VOLTAREN) 1 % topical gel Apply 2 g topically 4 (four) times daily 200 g 3  . EPIPEN 2-PAK 0.3 mg/0.3 mL pen injector Inject 0.3 mg into the muscle once as needed  . FUROsemide (LASIX)  40 MG tablet 40 mg once daily  . gabapentin (NEURONTIN) 300 MG capsule Take 1 cap in the morning and 2 caps at bedtime 90 capsule 3  . glipiZIDE (GLUCOTROL) 5 MG tablet Take 5 mg by mouth every morning before breakfast  . hydrOXYzine (ATARAX) 25 MG tablet TAKE ONE TABLET BY MOUTH 3 TIMES DAILY AS NEEDED FOR ITCHING 90 tablet 3  . isosorbide  mononitrate (IMDUR) 60 MG ER tablet Take 60 mg by mouth once daily  . leflunomide (ARAVA) 20 MG tablet Take 1 tablet (20 mg total) by mouth once daily 30 tablet 11  . liraglutide (VICTOZA 2-PAK) 0.6 mg/0.1 mL (18 mg/3 mL) pen injector Inject subcutaneously  . losartan (COZAAR) 100 MG tablet Take 100 mg by mouth once daily  . metFORMIN (GLUCOPHAGE) 500 MG tablet TAKE 1 TABLET BY MOUTH TWO TIMES DAILY  . ONETOUCH ULTRA BLUE TEST STRIP test strip 1 strip 3 (three) times daily  . rosuvastatin (CRESTOR) 20 MG tablet Take 20 mg by mouth once daily  . timoloL maleate (TIMOPTIC) 0.5 % ophthalmic solution Place 1 drop into both eyes 2 (two) times daily   No current facility-administered medications for this visit.   Allergies:  Allergies  Allergen Reactions  . Oxycodone Hallucination and Other (See Comments)  Delusions   Delusions  . Venom-Honey Bee Anaphylaxis  . Hydromorphone Nausea and Hallucination  . Zolpidem Hallucination  Crazy, bad dreams   Social History:  Social History   Socioeconomic History  . Marital status: Married  Spouse name: [name]  . Number of children: 0  . Years of education: 52  . Highest education level: Not on file  Occupational History  . Occupation: Retired  Tobacco Use  . Smoking status: Former Smoker  Packs/day: 2.00  Years: 20.00  Pack years: 40.00  Types: Cigarettes  Quit date: 2007  Years since quitting: 14.4  . Smokeless tobacco: Former Systems developer  Types: Chew  Quit date: 2007  . Tobacco comment: chewed tobacco  Vaping Use  . Vaping Use: Never used  Substance and Sexual Activity  . Alcohol use: No  Alcohol/week: 0.0 standard drinks  . Drug use: No  . Sexual activity: Defer  Partners: Female  Other Topics Concern  . Not on file  Social History Narrative  . Not on file   Social Determinants of Health   Financial Resource Strain:  . Difficulty of Paying Living Expenses:  Food Insecurity:  . Worried About Charity fundraiser in the Last  Year:  . Arboriculturist in the Last Year:  Transportation Needs:  . Film/video editor (Medical):  Marland Kitchen Lack of Transportation (Non-Medical):  Physical Activity:  . Days of Exercise per Week:  . Minutes of Exercise per Session:  Stress:  . Feeling of Stress :  Social Connections:  . Frequency of Communication with Friends and Family:  . Frequency of Social Gatherings with Friends and Family:  . Attends Religious Services:  . Active Member of Clubs or Organizations:  . Attends Archivist Meetings:  Marland Kitchen Marital Status:   Family History:  Family History  Adopted: Yes   Review of Systems:   A 10+ ROS was performed, reviewed, and the pertinent orthopaedic findings are documented in the HPI.   Physical Examination:   BP 140/80  Ht 167.6 cm (5\' 6" )  Wt (!) 105.4 kg (232 lb 6.4 oz)  BMI 37.51 kg/m   Patient is a well-developed, well-nourished male in no acute distress. Patient has  normal mood and affect. Patient is alert and oriented to person, place, and time.   HEENT: Atraumatic, normocephalic. Pupils equal and reactive to light. Extraocular motion intact. Noninjected sclera.  Cardiovascular: Regular rate and rhythm, with no murmurs, rubs, or gallops. Distal pulses palpable.  Respiratory: Lungs clear to auscultation bilaterally.   Right Knee: Soft tissue swelling: mild Effusion: none Erythema: none Crepitance: minimal Tenderness: medial Alignment: relative varus Mediolateral laxity: medial pseudolaxity Posterior sag: negative Patellar tracking: Good tracking without evidence of subluxation or tilt Atrophy: No significant atrophy.  Quadriceps tone was good. Range of motion: 0/0/108 degrees  Sensation intact over the saphenous, lateral sural cutaneous, superficial fibular, and deep fibular nerve distributions.  Tests Performed/Reviewed:  X-rays  Anteroposterior, lateral, and sunrise views of the right knee were obtained. Images reveal complete loss of  medial and lateral compartment joint spaces with subchondral sclerosis of the bone and bone-on-bone contact. Patellofemoral joint reveals loss of joint space with osteophyte formation. No fractures or dislocations noted.  I personally ordered and interpreted today's radiographs.  Impression:   ICD-10-CM  1. Primary osteoarthritis of right knee M17.11   Plan:   The patient has end-stage degenerative changes of the right knee. It was explained to the patient that the condition is progressive in nature. Having failed conservative treatment, the patient has elected to proceed with a total joint arthroplasty. The patient will undergo a total joint arthroplasty with Dr. Marry Guan. The risks of surgery, including blood clot and infection, were discussed with the patient. Measures to reduce these risks, including the use of anticoagulation, perioperative antibiotics, and early ambulation were discussed. The importance of postoperative physical therapy was discussed with the patient. The patient elects to proceed with surgery. The patient is instructed to stop all blood thinners prior to surgery. The patient is instructed to call the hospital the day before surgery to learn of the proper arrival time.   Contact our office with any questions or concerns. Follow up as indicated, or sooner should any new problems arise, if conditions worsen, or if they are otherwise concerned.   Gwenlyn Fudge, PA-C [facility] [address], [place] [address] Phone: [phone]  This note was generated in part with voice recognition software and I apologize for any typographical errors that were not detected and corrected.   Electronically signed by Gwenlyn Fudge, PA at 01/17/2020 5:04 PM EDT

## 2020-01-28 ENCOUNTER — Inpatient Hospital Stay: Payer: Medicare Other | Admitting: Registered Nurse

## 2020-01-28 ENCOUNTER — Inpatient Hospital Stay
Admission: RE | Admit: 2020-01-28 | Discharge: 2020-01-30 | DRG: 470 | Disposition: A | Discharge status: Home / Self Care | Payer: Medicare Other | Attending: Orthopedic Surgery | Admitting: Orthopedic Surgery

## 2020-01-28 ENCOUNTER — Encounter: Payer: Self-pay | Admitting: Orthopedic Surgery

## 2020-01-28 ENCOUNTER — Inpatient Hospital Stay: Payer: Medicare Other

## 2020-01-28 ENCOUNTER — Other Ambulatory Visit: Payer: Self-pay

## 2020-01-28 ENCOUNTER — Encounter
Admission: RE | Disposition: A | Discharge status: Home / Self Care | Payer: Self-pay | Source: Home / Self Care | Attending: Orthopedic Surgery

## 2020-01-28 DIAGNOSIS — Z885 Allergy status to narcotic agent status: Secondary | ICD-10-CM

## 2020-01-28 DIAGNOSIS — Z87891 Personal history of nicotine dependence: Secondary | ICD-10-CM

## 2020-01-28 DIAGNOSIS — G4733 Obstructive sleep apnea (adult) (pediatric): Secondary | ICD-10-CM | POA: Diagnosis not present

## 2020-01-28 DIAGNOSIS — Z91030 Bee allergy status: Secondary | ICD-10-CM

## 2020-01-28 DIAGNOSIS — M47816 Spondylosis without myelopathy or radiculopathy, lumbar region: Secondary | ICD-10-CM | POA: Diagnosis not present

## 2020-01-28 DIAGNOSIS — N183 Chronic kidney disease, stage 3 unspecified: Secondary | ICD-10-CM | POA: Diagnosis not present

## 2020-01-28 DIAGNOSIS — M25761 Osteophyte, right knee: Secondary | ICD-10-CM | POA: Diagnosis not present

## 2020-01-28 DIAGNOSIS — I129 Hypertensive chronic kidney disease with stage 1 through stage 4 chronic kidney disease, or unspecified chronic kidney disease: Secondary | ICD-10-CM | POA: Diagnosis not present

## 2020-01-28 DIAGNOSIS — Z9081 Acquired absence of spleen: Secondary | ICD-10-CM

## 2020-01-28 DIAGNOSIS — E1122 Type 2 diabetes mellitus with diabetic chronic kidney disease: Secondary | ICD-10-CM | POA: Diagnosis not present

## 2020-01-28 DIAGNOSIS — E7800 Pure hypercholesterolemia, unspecified: Secondary | ICD-10-CM | POA: Diagnosis present

## 2020-01-28 DIAGNOSIS — E785 Hyperlipidemia, unspecified: Secondary | ICD-10-CM | POA: Diagnosis present

## 2020-01-28 DIAGNOSIS — M0600 Rheumatoid arthritis without rheumatoid factor, unspecified site: Secondary | ICD-10-CM | POA: Diagnosis not present

## 2020-01-28 DIAGNOSIS — Z8572 Personal history of non-Hodgkin lymphomas: Secondary | ICD-10-CM | POA: Diagnosis not present

## 2020-01-28 DIAGNOSIS — Z888 Allergy status to other drugs, medicaments and biological substances status: Secondary | ICD-10-CM

## 2020-01-28 DIAGNOSIS — Z7984 Long term (current) use of oral hypoglycemic drugs: Secondary | ICD-10-CM

## 2020-01-28 DIAGNOSIS — E1140 Type 2 diabetes mellitus with diabetic neuropathy, unspecified: Secondary | ICD-10-CM | POA: Diagnosis not present

## 2020-01-28 DIAGNOSIS — Z87892 Personal history of anaphylaxis: Secondary | ICD-10-CM

## 2020-01-28 DIAGNOSIS — Z471 Aftercare following joint replacement surgery: Secondary | ICD-10-CM | POA: Diagnosis not present

## 2020-01-28 DIAGNOSIS — Z8612 Personal history of poliomyelitis: Secondary | ICD-10-CM

## 2020-01-28 DIAGNOSIS — Z96651 Presence of right artificial knee joint: Secondary | ICD-10-CM | POA: Diagnosis not present

## 2020-01-28 DIAGNOSIS — Z79899 Other long term (current) drug therapy: Secondary | ICD-10-CM | POA: Diagnosis not present

## 2020-01-28 DIAGNOSIS — M1711 Unilateral primary osteoarthritis, right knee: Principal | ICD-10-CM | POA: Diagnosis present

## 2020-01-28 DIAGNOSIS — Z6837 Body mass index (BMI) 37.0-37.9, adult: Secondary | ICD-10-CM

## 2020-01-28 DIAGNOSIS — Z96659 Presence of unspecified artificial knee joint: Secondary | ICD-10-CM

## 2020-01-28 HISTORY — PX: KNEE ARTHROPLASTY: SHX992

## 2020-01-28 LAB — GLUCOSE, CAPILLARY
Glucose-Capillary: 160 mg/dL — ABNORMAL HIGH (ref 70–99)
Glucose-Capillary: 216 mg/dL — ABNORMAL HIGH (ref 70–99)
Glucose-Capillary: 242 mg/dL — ABNORMAL HIGH (ref 70–99)

## 2020-01-28 SURGERY — ARTHROPLASTY, KNEE, TOTAL, USING IMAGELESS COMPUTER-ASSISTED NAVIGATION
Anesthesia: General | Site: Knee | Laterality: Right

## 2020-01-28 MED ORDER — ORAL CARE MOUTH RINSE: 15.0000 mL | Freq: Once | OROMUCOSAL | Status: AC

## 2020-01-28 MED ORDER — CLORAZEPATE DIPOTASSIUM 7.5 MG PO TABS: 7.5000 mg | ORAL_TABLET | Freq: Two times a day (BID) | ORAL | Status: DC | PRN

## 2020-01-28 MED ORDER — PROPOFOL 10 MG/ML IV BOLUS
INTRAVENOUS | Status: DC | PRN
  Administered 2020-01-28: 50 mg via INTRAVENOUS
  Administered 2020-01-28: 20 mg via INTRAVENOUS
  Administered 2020-01-28: 10 mg via INTRAVENOUS

## 2020-01-28 MED ORDER — TRAMADOL HCL 50 MG PO TABS: 50.0000 mg | ORAL_TABLET | ORAL | Status: DC | PRN

## 2020-01-28 MED ORDER — BUPIVACAINE HCL (PF) 0.5 % IJ SOLN
INTRAMUSCULAR | Status: AC
  Filled 2020-01-28: qty 10

## 2020-01-28 MED ORDER — NEOMYCIN-POLYMYXIN B GU 40-200000 IR SOLN
Status: DC | PRN
  Administered 2020-01-28: 16 mL

## 2020-01-28 MED ORDER — LOSARTAN POTASSIUM 50 MG PO TABS
100.0000 mg | ORAL_TABLET | Freq: Every day | ORAL | Status: DC
  Administered 2020-01-29 – 2020-01-30 (×2): 100 mg via ORAL
  Filled 2020-01-28 (×2): qty 2

## 2020-01-28 MED ORDER — GABAPENTIN 300 MG PO CAPS
ORAL_CAPSULE | ORAL | Status: AC
  Administered 2020-01-28: 300 mg via ORAL
  Filled 2020-01-28: qty 1

## 2020-01-28 MED ORDER — FERROUS SULFATE 325 (65 FE) MG PO TABS
325.0000 mg | ORAL_TABLET | Freq: Two times a day (BID) | ORAL | Status: DC
  Administered 2020-01-28 – 2020-01-30 (×4): 325 mg via ORAL
  Filled 2020-01-28 (×4): qty 1

## 2020-01-28 MED ORDER — BISACODYL 10 MG RE SUPP: 10.0000 mg | Freq: Every day | RECTAL | Status: DC | PRN

## 2020-01-28 MED ORDER — CHLORHEXIDINE GLUCONATE 0.12 % MT SOLN
OROMUCOSAL | Status: AC
  Administered 2020-01-28: 15 mL via OROMUCOSAL
  Filled 2020-01-28: qty 15

## 2020-01-28 MED ORDER — GABAPENTIN 300 MG PO CAPS: 300.0000 mg | ORAL_CAPSULE | Freq: Every day | ORAL | Status: DC

## 2020-01-28 MED ORDER — GLYCOPYRROLATE 0.2 MG/ML IJ SOLN
INTRAMUSCULAR | Status: DC | PRN
  Administered 2020-01-28 (×2): .1 mg via INTRAVENOUS

## 2020-01-28 MED ORDER — ACETAMINOPHEN 10 MG/ML IV SOLN: 1000.0000 mg | Freq: Once | INTRAVENOUS | Status: DC | PRN

## 2020-01-28 MED ORDER — LACTATED RINGERS IV SOLN
INTRAVENOUS | Status: DC | PRN
Start: 2020-01-28 — End: 2020-01-28

## 2020-01-28 MED ORDER — ISOSORBIDE MONONITRATE ER 60 MG PO TB24
60.0000 mg | ORAL_TABLET | Freq: Every day | ORAL | Status: DC
  Administered 2020-01-29 – 2020-01-30 (×2): 60 mg via ORAL
  Filled 2020-01-28 (×2): qty 1

## 2020-01-28 MED ORDER — PROPOFOL 500 MG/50ML IV EMUL
INTRAVENOUS | Status: AC
  Filled 2020-01-28: qty 50

## 2020-01-28 MED ORDER — SODIUM CHLORIDE 0.9 % IV SOLN: INTRAVENOUS | Status: DC

## 2020-01-28 MED ORDER — METOCLOPRAMIDE HCL 10 MG PO TABS
10.0000 mg | ORAL_TABLET | Freq: Three times a day (TID) | ORAL | Status: DC
  Administered 2020-01-28 – 2020-01-30 (×6): 10 mg via ORAL
  Filled 2020-01-28 (×7): qty 1

## 2020-01-28 MED ORDER — SODIUM CHLORIDE (PF) 0.9 % IJ SOLN
INTRAMUSCULAR | Status: AC
  Filled 2020-01-28: qty 10

## 2020-01-28 MED ORDER — CEFAZOLIN SODIUM-DEXTROSE 2-4 GM/100ML-% IV SOLN
INTRAVENOUS | Status: AC
  Filled 2020-01-28: qty 100

## 2020-01-28 MED ORDER — ACETAMINOPHEN 325 MG PO TABS: 325.0000 mg | ORAL_TABLET | Freq: Four times a day (QID) | ORAL | Status: DC | PRN

## 2020-01-28 MED ORDER — FENTANYL CITRATE (PF) 100 MCG/2ML IJ SOLN
25.0000 ug | INTRAMUSCULAR | Status: DC | PRN
  Administered 2020-01-28 (×2): 50 ug via INTRAVENOUS

## 2020-01-28 MED ORDER — CEFAZOLIN SODIUM-DEXTROSE 2-4 GM/100ML-% IV SOLN
2.0000 g | Freq: Four times a day (QID) | INTRAVENOUS | Status: AC
  Administered 2020-01-28 – 2020-01-29 (×4): 2 g via INTRAVENOUS
  Filled 2020-01-28 (×4): qty 100

## 2020-01-28 MED ORDER — CELECOXIB 200 MG PO CAPS
200.0000 mg | ORAL_CAPSULE | Freq: Two times a day (BID) | ORAL | Status: DC
  Administered 2020-01-28 – 2020-01-30 (×4): 200 mg via ORAL
  Filled 2020-01-28 (×4): qty 1

## 2020-01-28 MED ORDER — ENSURE PRE-SURGERY PO LIQD
296.0000 mL | Freq: Once | ORAL | Status: AC
  Administered 2020-01-28: 296 mL via ORAL
  Filled 2020-01-28: qty 296

## 2020-01-28 MED ORDER — PROPOFOL 500 MG/50ML IV EMUL
INTRAVENOUS | Status: DC | PRN
  Administered 2020-01-28: 100 ug/kg/min via INTRAVENOUS

## 2020-01-28 MED ORDER — TRANEXAMIC ACID-NACL 1000-0.7 MG/100ML-% IV SOLN
INTRAVENOUS | Status: AC
  Filled 2020-01-28: qty 100

## 2020-01-28 MED ORDER — LABETALOL HCL 5 MG/ML IV SOLN
INTRAVENOUS | Status: AC
  Filled 2020-01-28: qty 4

## 2020-01-28 MED ORDER — TRANEXAMIC ACID-NACL 1000-0.7 MG/100ML-% IV SOLN
INTRAVENOUS | Status: AC
  Administered 2020-01-28: 1000 mg via INTRAVENOUS
  Filled 2020-01-28: qty 100

## 2020-01-28 MED ORDER — CELECOXIB 200 MG PO CAPS: 400.0000 mg | ORAL_CAPSULE | Freq: Once | ORAL | Status: AC

## 2020-01-28 MED ORDER — DIPHENHYDRAMINE HCL 12.5 MG/5ML PO ELIX: 12.5000 mg | ORAL_SOLUTION | ORAL | Status: DC | PRN

## 2020-01-28 MED ORDER — DEXAMETHASONE SODIUM PHOSPHATE 10 MG/ML IJ SOLN
INTRAMUSCULAR | Status: AC
  Administered 2020-01-28: 8 mg via INTRAVENOUS
  Filled 2020-01-28: qty 1

## 2020-01-28 MED ORDER — METOCLOPRAMIDE HCL 10 MG PO TABS
5.0000 mg | ORAL_TABLET | Freq: Three times a day (TID) | ORAL | Status: DC | PRN
  Administered 2020-01-29: 10 mg via ORAL

## 2020-01-28 MED ORDER — INSULIN ASPART 100 UNIT/ML ~~LOC~~ SOLN
0.0000 [IU] | Freq: Three times a day (TID) | SUBCUTANEOUS | Status: DC
  Administered 2020-01-29 (×3): 5 [IU] via SUBCUTANEOUS
  Administered 2020-01-30: 6 [IU] via SUBCUTANEOUS
  Filled 2020-01-28 (×4): qty 1

## 2020-01-28 MED ORDER — FLEET ENEMA 7-19 GM/118ML RE ENEM: 1.0000 | ENEMA | Freq: Once | RECTAL | Status: DC | PRN

## 2020-01-28 MED ORDER — TIMOLOL MALEATE 0.25 % OP SOLN
1.0000 [drp] | Freq: Two times a day (BID) | OPHTHALMIC | Status: DC
  Administered 2020-01-28 – 2020-01-30 (×3): 1 [drp] via OPHTHALMIC
  Administered 2020-01-30: 2 [drp] via OPHTHALMIC
  Filled 2020-01-28: qty 5

## 2020-01-28 MED ORDER — ONDANSETRON HCL 4 MG/2ML IJ SOLN: 4.0000 mg | Freq: Four times a day (QID) | INTRAMUSCULAR | Status: DC | PRN

## 2020-01-28 MED ORDER — ACETAMINOPHEN 10 MG/ML IV SOLN
INTRAVENOUS | Status: AC
  Filled 2020-01-28: qty 100

## 2020-01-28 MED ORDER — LIDOCAINE HCL (CARDIAC) PF 100 MG/5ML IV SOSY
PREFILLED_SYRINGE | INTRAVENOUS | Status: DC | PRN
  Administered 2020-01-28: 60 mg via INTRAVENOUS

## 2020-01-28 MED ORDER — CLOBETASOL PROPIONATE 0.05 % EX CREA
1.0000 "application " | TOPICAL_CREAM | Freq: Two times a day (BID) | CUTANEOUS | Status: DC | PRN
  Filled 2020-01-28: qty 15

## 2020-01-28 MED ORDER — VITAMIN D3 25 MCG (1000 UNIT) PO TABS
10000.0000 [IU] | ORAL_TABLET | Freq: Every day | ORAL | Status: DC
  Administered 2020-01-29 – 2020-01-30 (×2): 10000 [IU] via ORAL
  Filled 2020-01-28 (×4): qty 10

## 2020-01-28 MED ORDER — ROSUVASTATIN CALCIUM 10 MG PO TABS
20.0000 mg | ORAL_TABLET | Freq: Every day | ORAL | Status: DC
  Administered 2020-01-29 – 2020-01-30 (×2): 20 mg via ORAL
  Filled 2020-01-28 (×2): qty 2

## 2020-01-28 MED ORDER — SENNOSIDES-DOCUSATE SODIUM 8.6-50 MG PO TABS
1.0000 | ORAL_TABLET | Freq: Two times a day (BID) | ORAL | Status: DC
  Administered 2020-01-28 – 2020-01-30 (×4): 1 via ORAL
  Filled 2020-01-28 (×4): qty 1

## 2020-01-28 MED ORDER — FAMOTIDINE 20 MG PO TABS: 20.0000 mg | ORAL_TABLET | Freq: Once | ORAL | Status: AC

## 2020-01-28 MED ORDER — PHENOL 1.4 % MT LIQD: 1.0000 | OROMUCOSAL | Status: DC | PRN

## 2020-01-28 MED ORDER — MIDAZOLAM HCL 2 MG/2ML IJ SOLN
INTRAMUSCULAR | Status: DC | PRN
Start: 2020-01-28 — End: 2020-01-28
  Administered 2020-01-28: 1 mg via INTRAVENOUS

## 2020-01-28 MED ORDER — TRANEXAMIC ACID-NACL 1000-0.7 MG/100ML-% IV SOLN: 1000.0000 mg | Freq: Once | INTRAVENOUS | Status: AC

## 2020-01-28 MED ORDER — DEXAMETHASONE SODIUM PHOSPHATE 10 MG/ML IJ SOLN: 8.0000 mg | Freq: Once | INTRAMUSCULAR | Status: AC

## 2020-01-28 MED ORDER — LABETALOL HCL 5 MG/ML IV SOLN
INTRAVENOUS | Status: DC | PRN
Start: 2020-01-28 — End: 2020-01-28
  Administered 2020-01-28 (×2): 10 mg via INTRAVENOUS
  Administered 2020-01-28 (×4): 5 mg via INTRAVENOUS

## 2020-01-28 MED ORDER — ACETAMINOPHEN 10 MG/ML IV SOLN
1000.0000 mg | Freq: Four times a day (QID) | INTRAVENOUS | Status: AC
  Administered 2020-01-28 – 2020-01-29 (×4): 1000 mg via INTRAVENOUS
  Filled 2020-01-28 (×5): qty 100

## 2020-01-28 MED ORDER — ADULT MULTIVITAMIN W/MINERALS CH
1.0000 | ORAL_TABLET | Freq: Every day | ORAL | Status: DC
  Administered 2020-01-29 – 2020-01-30 (×2): 1 via ORAL
  Filled 2020-01-28 (×2): qty 1

## 2020-01-28 MED ORDER — FAMOTIDINE 20 MG PO TABS
ORAL_TABLET | ORAL | Status: AC
  Administered 2020-01-28: 20 mg via ORAL
  Filled 2020-01-28: qty 1

## 2020-01-28 MED ORDER — METFORMIN HCL 500 MG PO TABS
500.0000 mg | ORAL_TABLET | Freq: Two times a day (BID) | ORAL | Status: DC
  Administered 2020-01-28 – 2020-01-30 (×4): 500 mg via ORAL
  Filled 2020-01-28 (×4): qty 1

## 2020-01-28 MED ORDER — BUPIVACAINE HCL (PF) 0.5 % IJ SOLN
INTRAMUSCULAR | Status: DC | PRN
  Administered 2020-01-28: 3 mL via INTRATHECAL

## 2020-01-28 MED ORDER — GLYCOPYRROLATE 0.2 MG/ML IJ SOLN
INTRAMUSCULAR | Status: AC
  Filled 2020-01-28: qty 1

## 2020-01-28 MED ORDER — ALUM & MAG HYDROXIDE-SIMETH 200-200-20 MG/5ML PO SUSP
30.0000 mL | ORAL | Status: DC | PRN
  Filled 2020-01-28: qty 30

## 2020-01-28 MED ORDER — MIDAZOLAM HCL 2 MG/2ML IJ SOLN
INTRAMUSCULAR | Status: AC
  Filled 2020-01-28: qty 2

## 2020-01-28 MED ORDER — GABAPENTIN 300 MG PO CAPS
300.0000 mg | ORAL_CAPSULE | Freq: Three times a day (TID) | ORAL | Status: DC
  Administered 2020-01-28 – 2020-01-30 (×5): 300 mg via ORAL
  Filled 2020-01-28 (×5): qty 1

## 2020-01-28 MED ORDER — PHENYLEPHRINE HCL (PRESSORS) 10 MG/ML IV SOLN
INTRAVENOUS | Status: DC | PRN
  Administered 2020-01-28 (×2): 100 ug via INTRAVENOUS

## 2020-01-28 MED ORDER — ENOXAPARIN SODIUM 30 MG/0.3ML ~~LOC~~ SOLN
30.0000 mg | Freq: Two times a day (BID) | SUBCUTANEOUS | Status: DC
  Administered 2020-01-29 – 2020-01-30 (×4): 30 mg via SUBCUTANEOUS
  Filled 2020-01-28 (×3): qty 0.3

## 2020-01-28 MED ORDER — HYDROMORPHONE HCL 1 MG/ML IJ SOLN: 0.5000 mg | INTRAMUSCULAR | Status: DC | PRN

## 2020-01-28 MED ORDER — AMLODIPINE BESYLATE 10 MG PO TABS
10.0000 mg | ORAL_TABLET | Freq: Every day | ORAL | Status: DC
  Administered 2020-01-29 – 2020-01-30 (×2): 10 mg via ORAL
  Filled 2020-01-28 (×2): qty 1

## 2020-01-28 MED ORDER — GABAPENTIN 300 MG PO CAPS: 300.0000 mg | ORAL_CAPSULE | Freq: Once | ORAL | Status: AC

## 2020-01-28 MED ORDER — LIRAGLUTIDE 18 MG/3ML ~~LOC~~ SOPN: 1.2000 mg | PEN_INJECTOR | Freq: Every day | SUBCUTANEOUS | Status: DC

## 2020-01-28 MED ORDER — FUROSEMIDE 40 MG PO TABS
40.0000 mg | ORAL_TABLET | Freq: Two times a day (BID) | ORAL | Status: DC
  Administered 2020-01-28 – 2020-01-30 (×4): 40 mg via ORAL
  Filled 2020-01-28 (×4): qty 1

## 2020-01-28 MED ORDER — OXYCODONE HCL 5 MG PO TABS
5.0000 mg | ORAL_TABLET | ORAL | Status: DC | PRN
  Administered 2020-01-28 – 2020-01-30 (×7): 5 mg via ORAL
  Filled 2020-01-28 (×6): qty 1

## 2020-01-28 MED ORDER — ACETAMINOPHEN 10 MG/ML IV SOLN
INTRAVENOUS | Status: DC | PRN
  Administered 2020-01-28: 1000 mg via INTRAVENOUS

## 2020-01-28 MED ORDER — HYDROXYZINE HCL 25 MG PO TABS
25.0000 mg | ORAL_TABLET | Freq: Three times a day (TID) | ORAL | Status: DC | PRN
  Filled 2020-01-28: qty 1

## 2020-01-28 MED ORDER — SODIUM CHLORIDE 0.9 % IV SOLN
INTRAVENOUS | Status: DC | PRN
  Administered 2020-01-28: 50 ug/min via INTRAVENOUS

## 2020-01-28 MED ORDER — METOCLOPRAMIDE HCL 5 MG/ML IJ SOLN: 5.0000 mg | Freq: Three times a day (TID) | INTRAMUSCULAR | Status: DC | PRN

## 2020-01-28 MED ORDER — MENTHOL 3 MG MT LOZG: 1.0000 | LOZENGE | OROMUCOSAL | Status: DC | PRN

## 2020-01-28 MED ORDER — ROCURONIUM BROMIDE 10 MG/ML (PF) SYRINGE
PREFILLED_SYRINGE | INTRAVENOUS | Status: AC
  Filled 2020-01-28: qty 10

## 2020-01-28 MED ORDER — CELECOXIB 200 MG PO CAPS
ORAL_CAPSULE | ORAL | Status: AC
  Administered 2020-01-28: 400 mg via ORAL
  Filled 2020-01-28: qty 2

## 2020-01-28 MED ORDER — ONDANSETRON HCL 4 MG PO TABS: 4.0000 mg | ORAL_TABLET | Freq: Four times a day (QID) | ORAL | Status: DC | PRN

## 2020-01-28 MED ORDER — ONDANSETRON HCL 4 MG/2ML IJ SOLN: 4.0000 mg | Freq: Once | INTRAMUSCULAR | Status: DC | PRN

## 2020-01-28 MED ORDER — KETAMINE HCL 10 MG/ML IJ SOLN
INTRAMUSCULAR | Status: DC | PRN
Start: 2020-01-28 — End: 2020-01-28
  Administered 2020-01-28: 10 mg via INTRAVENOUS
  Administered 2020-01-28 (×2): 20 mg via INTRAVENOUS

## 2020-01-28 MED ORDER — CARVEDILOL 12.5 MG PO TABS
12.5000 mg | ORAL_TABLET | Freq: Two times a day (BID) | ORAL | Status: DC
  Administered 2020-01-28 – 2020-01-30 (×4): 12.5 mg via ORAL
  Filled 2020-01-28 (×4): qty 1

## 2020-01-28 MED ORDER — EPHEDRINE 5 MG/ML INJ
INTRAVENOUS | Status: AC
  Filled 2020-01-28: qty 10

## 2020-01-28 MED ORDER — OXYCODONE HCL 5 MG PO TABS
10.0000 mg | ORAL_TABLET | ORAL | Status: DC | PRN
  Filled 2020-01-28: qty 2

## 2020-01-28 MED ORDER — CHLORHEXIDINE GLUCONATE 4 % EX LIQD
60.0000 mL | Freq: Once | CUTANEOUS | Status: AC
  Administered 2020-01-28: 4 via TOPICAL

## 2020-01-28 MED ORDER — CHLORHEXIDINE GLUCONATE 0.12 % MT SOLN: 15.0000 mL | Freq: Once | OROMUCOSAL | Status: AC

## 2020-01-28 MED ORDER — PANTOPRAZOLE SODIUM 40 MG PO TBEC
40.0000 mg | DELAYED_RELEASE_TABLET | Freq: Two times a day (BID) | ORAL | Status: DC
  Administered 2020-01-28 – 2020-01-30 (×4): 40 mg via ORAL
  Filled 2020-01-28 (×4): qty 1

## 2020-01-28 MED ORDER — BUPIVACAINE HCL (PF) 0.25 % IJ SOLN
INTRAMUSCULAR | Status: DC | PRN
  Administered 2020-01-28: 60 mL

## 2020-01-28 MED ORDER — DEXMEDETOMIDINE HCL 200 MCG/2ML IV SOLN
INTRAVENOUS | Status: DC | PRN
  Administered 2020-01-28: 12 ug via INTRAVENOUS
  Administered 2020-01-28 (×2): 8 ug via INTRAVENOUS

## 2020-01-28 MED ORDER — LIDOCAINE HCL (PF) 2 % IJ SOLN
INTRAMUSCULAR | Status: AC
  Filled 2020-01-28: qty 5

## 2020-01-28 MED ORDER — MAGNESIUM HYDROXIDE 400 MG/5ML PO SUSP
30.0000 mL | Freq: Every day | ORAL | Status: DC
  Administered 2020-01-29 – 2020-01-30 (×2): 30 mL via ORAL
  Filled 2020-01-28 (×2): qty 30

## 2020-01-28 MED ORDER — SODIUM CHLORIDE 0.9 % IV SOLN
INTRAVENOUS | Status: DC | PRN
  Administered 2020-01-28: 60 mL

## 2020-01-28 MED ORDER — FENTANYL CITRATE (PF) 100 MCG/2ML IJ SOLN
INTRAMUSCULAR | Status: AC
  Filled 2020-01-28: qty 2

## 2020-01-28 SURGICAL SUPPLY — 76 items
ATTUNE MED DOME PAT 38 KNEE (Knees) ×1 IMPLANT
ATTUNE PS FEM RT SZ 5 CEM KNEE (Femur) ×1 IMPLANT
BASE TIBIA ATTUNE KNEE SYS SZ6 (Knees) IMPLANT
BATTERY INSTRU NAVIGATION (MISCELLANEOUS) ×8 IMPLANT
BLADE SAW 70X12.5 (BLADE) ×2 IMPLANT
BLADE SAW 90X13X1.19 OSCILLAT (BLADE) ×2 IMPLANT
BLADE SAW 90X25X1.19 OSCILLAT (BLADE) ×2 IMPLANT
BONE CEMENT GENTAMICIN (Cement) ×4 IMPLANT
BSPLAT TIB 6 CMNT ROT PLAT STR (Knees) ×1 IMPLANT
BTRY SRG DRVR LF (MISCELLANEOUS) ×4
CANISTER SUCT 3000ML PPV (MISCELLANEOUS) ×2 IMPLANT
CEMENT BONE GENTAMICIN 40 (Cement) IMPLANT
COOLER POLAR GLACIER W/PUMP (MISCELLANEOUS) ×2 IMPLANT
COVER WAND RF STERILE (DRAPES) ×2 IMPLANT
CUFF TOURN SGL QUICK 30 (TOURNIQUET CUFF) ×2
CUFF TRNQT CYL 30X4X21-28X (TOURNIQUET CUFF) IMPLANT
DRAPE 3/4 80X56 (DRAPES) ×2 IMPLANT
DRSG DERMACEA 8X12 NADH (GAUZE/BANDAGES/DRESSINGS) ×2 IMPLANT
DRSG OPSITE POSTOP 4X14 (GAUZE/BANDAGES/DRESSINGS) ×2 IMPLANT
DRSG TEGADERM 4X4.75 (GAUZE/BANDAGES/DRESSINGS) ×2 IMPLANT
DURAPREP 26ML APPLICATOR (WOUND CARE) ×4 IMPLANT
ELECT REM PT RETURN 9FT ADLT (ELECTROSURGICAL) ×2
ELECTRODE REM PT RTRN 9FT ADLT (ELECTROSURGICAL) ×1 IMPLANT
EX-PIN ORTHOLOCK NAV 4X150 (PIN) ×4 IMPLANT
GLOVE BIO SURGEON STRL SZ7.5 (GLOVE) ×4 IMPLANT
GLOVE BIOGEL M STRL SZ7.5 (GLOVE) ×4 IMPLANT
GLOVE BIOGEL PI IND STRL 7.5 (GLOVE) ×1 IMPLANT
GLOVE BIOGEL PI INDICATOR 7.5 (GLOVE) ×1
GLOVE INDICATOR 8.0 STRL GRN (GLOVE) ×2 IMPLANT
GOWN STRL REUS W/ TWL LRG LVL3 (GOWN DISPOSABLE) ×2 IMPLANT
GOWN STRL REUS W/ TWL XL LVL3 (GOWN DISPOSABLE) ×1 IMPLANT
GOWN STRL REUS W/TWL LRG LVL3 (GOWN DISPOSABLE) ×4
GOWN STRL REUS W/TWL XL LVL3 (GOWN DISPOSABLE) ×2
HEMOVAC 400CC 10FR (MISCELLANEOUS) ×2 IMPLANT
HOLDER FOLEY CATH W/STRAP (MISCELLANEOUS) ×2 IMPLANT
HOOD PEEL AWAY FLYTE STAYCOOL (MISCELLANEOUS) ×4 IMPLANT
INSERT TIBIAL KNEE SZ 5 12MM (Insert) IMPLANT
KIT TURNOVER KIT A (KITS) ×2 IMPLANT
KNIFE SCULPS 14X20 (INSTRUMENTS) ×2 IMPLANT
LABEL OR SOLS (LABEL) ×2 IMPLANT
MANIFOLD NEPTUNE II (INSTRUMENTS) ×2 IMPLANT
NDL SAFETY ECLIPSE 18X1.5 (NEEDLE) ×1 IMPLANT
NDL SPNL 20GX3.5 QUINCKE YW (NEEDLE) ×2 IMPLANT
NEEDLE HYPO 18GX1.5 SHARP (NEEDLE) ×2
NEEDLE SPNL 20GX3.5 QUINCKE YW (NEEDLE) ×4 IMPLANT
NS IRRIG 500ML POUR BTL (IV SOLUTION) ×2 IMPLANT
PACK TOTAL KNEE (MISCELLANEOUS) ×2 IMPLANT
PAD WRAPON POLAR KNEE (MISCELLANEOUS) ×1 IMPLANT
PENCIL SMOKE EVACUATOR COATED (MISCELLANEOUS) ×2 IMPLANT
PENCIL SMOKE ULTRAEVAC 22 CON (MISCELLANEOUS) ×2 IMPLANT
PIN DRILL QUICK PACK ×2 IMPLANT
PIN FIXATION 1/8DIA X 3INL (PIN) ×6 IMPLANT
PULSAVAC PLUS IRRIG FAN TIP (DISPOSABLE) ×2
SOL .9 NS 3000ML IRR  AL (IV SOLUTION) ×2
SOL .9 NS 3000ML IRR AL (IV SOLUTION) ×1
SOL .9 NS 3000ML IRR UROMATIC (IV SOLUTION) ×1 IMPLANT
SOL PREP PVP 2OZ (MISCELLANEOUS) ×2
SOLUTION PREP PVP 2OZ (MISCELLANEOUS) ×1 IMPLANT
SPONGE DRAIN TRACH 4X4 STRL 2S (GAUZE/BANDAGES/DRESSINGS) ×2 IMPLANT
STAPLER SKIN PROX 35W (STAPLE) ×2 IMPLANT
STOCKINETTE IMPERV 14X48 (MISCELLANEOUS) ×1 IMPLANT
STRAP TIBIA SHORT (MISCELLANEOUS) ×2 IMPLANT
SUCTION FRAZIER HANDLE 10FR (MISCELLANEOUS) ×2
SUCTION TUBE FRAZIER 10FR DISP (MISCELLANEOUS) ×1 IMPLANT
SUT VIC AB 0 CT1 36 (SUTURE) ×4 IMPLANT
SUT VIC AB 1 CT1 36 (SUTURE) ×6 IMPLANT
SUT VIC AB 2-0 CT2 27 (SUTURE) ×2 IMPLANT
SYR 20ML LL LF (SYRINGE) ×2 IMPLANT
SYR 30ML LL (SYRINGE) ×4 IMPLANT
TIBIA ATTUNE KNEE SYS BASE SZ6 (Knees) ×2 IMPLANT
TIBIAL INSERT KNEE SZ 5 12MM (Insert) ×2 IMPLANT
TIP FAN IRRIG PULSAVAC PLUS (DISPOSABLE) ×1 IMPLANT
TOWEL OR 17X26 4PK STRL BLUE (TOWEL DISPOSABLE) ×2 IMPLANT
TOWER CARTRIDGE SMART MIX (DISPOSABLE) ×2 IMPLANT
TRAY FOLEY MTR SLVR 16FR STAT (SET/KITS/TRAYS/PACK) ×2 IMPLANT
WRAPON POLAR PAD KNEE (MISCELLANEOUS) ×2

## 2020-01-28 NOTE — H&P (Signed)
The patient has been re-examined, and the chart reviewed, and there have been no interval changes to the documented history and physical.    The risks, benefits, and alternatives have been discussed at length. The patient expressed understanding of the risks benefits and agreed with plans for surgical intervention.  Talicia Sui P. Riko Lumsden, Jr. M.D.    

## 2020-01-28 NOTE — Transfer of Care (Signed)
Immediate Anesthesia Transfer of Care Note  Patient: Daniel Kline  Procedure(s) Performed: COMPUTER ASSISTED TOTAL KNEE ARTHROPLASTY (Right Knee)  Patient Location: PACU  Anesthesia Type:General  Level of Consciousness: drowsy  Airway & Oxygen Therapy: Patient Spontanous Breathing and Patient connected to face mask oxygen  Post-op Assessment: Report given to RN and Post -op Vital signs reviewed and stable  Post vital signs: Reviewed and stable  Last Vitals:  Vitals Value Taken Time  BP 103/64 01/28/20 1615  Temp    Pulse 92 01/28/20 1618  Resp 22 01/28/20 1618  SpO2 94 % 01/28/20 1618  Vitals shown include unvalidated device data.  Last Pain:  Vitals:   01/28/20 1053  TempSrc: Temporal  PainSc: 3       Patients Stated Pain Goal: 0 (18/98/42 1031)  Complications: No complications documented.

## 2020-01-28 NOTE — Anesthesia Procedure Notes (Signed)
Spinal  Patient location during procedure: OR Start time: 01/28/2020 12:13 PM End time: 01/28/2020 12:19 PM Staffing Performed: resident/CRNA  Anesthesiologist: Arita Miss, MD Resident/CRNA: Lia Foyer, CRNA Preanesthetic Checklist Completed: patient identified, IV checked, site marked, risks and benefits discussed, surgical consent, monitors and equipment checked, pre-op evaluation and timeout performed Spinal Block Patient position: sitting Prep: DuraPrep Patient monitoring: heart rate, cardiac monitor, continuous pulse ox and blood pressure Approach: midline Location: L3-4 Injection technique: single-shot Needle Needle type: Pencan  Needle gauge: 24 G Needle length: 9 cm Assessment Sensory level: T4

## 2020-01-28 NOTE — Anesthesia Preprocedure Evaluation (Signed)
Anesthesia Evaluation  Patient identified by MRN, date of birth, ID band Patient awake    Reviewed: Allergy & Precautions, NPO status , Patient's Chart, lab work & pertinent test results  History of Anesthesia Complications (+) PONV and history of anesthetic complications  Airway Mallampati: II  TM Distance: >3 FB Neck ROM: Full    Dental  (+) Edentulous Lower, Edentulous Upper   Pulmonary sleep apnea and Continuous Positive Airway Pressure Ventilation , neg COPD, former smoker,    breath sounds clear to auscultation- rhonchi (-) wheezing      Cardiovascular hypertension, (-) CAD, (-) Past MI, (-) Cardiac Stents and (-) CABG (-) dysrhythmias  Rhythm:Regular Rate:Normal - Systolic murmurs and - Diastolic murmurs Hx afib, s/p ablation. Now in sinus   Neuro/Psych neg Seizures PSYCHIATRIC DISORDERS Anxiety negative neurological ROS     GI/Hepatic Neg liver ROS, GERD  Controlled and Medicated,  Endo/Other  diabetes, Oral Hypoglycemic Agents  Renal/GU CRFRenal disease     Musculoskeletal  (+) Arthritis ,   Abdominal (+) + obese,   Peds  Hematology  (+) anemia ,   Anesthesia Other Findings Past Medical History: No date: Anemia     Comment:  H/O No date: Anxiety No date: Arthritis No date: Chronic kidney disease No date: Complication of anesthesia No date: CTCL (cutaneous T-cell lymphoma) (HCC) No date: Diabetes mellitus without complication (HCC) No date: Dysrhythmia     Comment:  A FLUTTER No date: Family history of adverse reaction to anesthesia     Comment:  PT WAS ADOPTED No date: HLD (hyperlipidemia) No date: HTN (hypertension) No date: HTN (hypertension) 2019: Hx of dysplastic nevus     Comment:  multiple sites 01/18/2018: Hx of squamous cell carcinoma     Comment:  R mid lateral forearm No date: MRSA (methicillin resistant Staphylococcus aureus)     Comment:  after back surgery No date: OSA (obstructive  sleep apnea)     Comment:  USES BIPAP No date: Polio 01/12/2010: POLIOMYELITIS     Comment:  Right arm affected No date: PONV (postoperative nausea and vomiting)   Reproductive/Obstetrics                             Anesthesia Physical  Anesthesia Plan  ASA: III  Anesthesia Plan: General   Post-op Pain Management:    Induction: Intravenous  PONV Risk Score and Plan: 3 and Propofol infusion  Airway Management Planned: Natural Airway  Additional Equipment: None  Intra-op Plan:   Post-operative Plan:   Informed Consent: I have reviewed the patients History and Physical, chart, labs and discussed the procedure including the risks, benefits and alternatives for the proposed anesthesia with the patient or authorized representative who has indicated his/her understanding and acceptance.     Dental advisory given  Plan Discussed with: CRNA and Surgeon  Anesthesia Plan Comments: (Discussed R/B/A of neuraxial anesthesia technique with patient: - rare risks of spinal/epidural hematoma, nerve damage, infection - Risk of PDPH - Risk of nausea and vomiting - Risk of conversion to general anesthesia and its associated risks, including sore throat, damage to lips/teeth/oropharynx, and rare risks such as cardiac and respiratory events.  Patient voiced understanding.)        Anesthesia Quick Evaluation

## 2020-01-28 NOTE — Op Note (Signed)
OPERATIVE NOTE  DATE OF SURGERY:  01/28/2020  PATIENT NAME:  Daniel Kline   DOB: [birth year]  MRN: [id number]  PRE-OPERATIVE DIAGNOSIS: Degenerative arthrosis of the right knee, primary  POST-OPERATIVE DIAGNOSIS:  Same  PROCEDURE:  Right total knee arthroplasty using computer-assisted navigation  SURGEON:  Marciano Sequin. M.D.  ASSISTANT: Cassell Smiles, PA-C (present and scrubbed throughout the case, critical for assistance with exposure, retraction, instrumentation, and closure)  ANESTHESIA: spinal  ESTIMATED BLOOD LOSS: 50 mL  FLUIDS REPLACED: 1000 mL of crystalloid  TOURNIQUET TIME: 120 minutes  DRAINS: 2 medium Hemovac drains  SOFT TISSUE RELEASES: Anterior cruciate ligament, posterior cruciate ligament, deep medial collateral ligament, patellofemoral ligament  IMPLANTS UTILIZED: DePuy Attune size 5 posterior stabilized femoral component (cemented), size 6 rotating platform tibial component (cemented), 38 mm medialized dome patella (cemented), and a 12 mm stabilized rotating platform polyethylene insert.  INDICATIONS FOR SURGERY: Daniel Kline is a 74 y.o. year old male with a long history of progressive knee pain. X-rays demonstrated severe degenerative changes in tricompartmental fashion. The patient had not seen any significant improvement despite conservative nonsurgical intervention. After discussion of the risks and benefits of surgical intervention, the patient expressed understanding of the risks benefits and agree with plans for total knee arthroplasty.   The risks, benefits, and alternatives were discussed at length including but not limited to the risks of infection, bleeding, nerve injury, stiffness, blood clots, the need for revision surgery, cardiopulmonary complications, among others, and they were willing to proceed.  PROCEDURE IN DETAIL: The patient was brought into the operating room and, after adequate spinal anesthesia was achieved, a tourniquet was  placed on the patient's upper thigh. The patient's knee and leg were cleaned and prepped with alcohol and DuraPrep and draped in the usual sterile fashion. A "timeout" was performed as per usual protocol. The lower extremity was exsanguinated using an Esmarch, and the tourniquet was inflated to 300 mmHg. An anterior longitudinal incision was made followed by a standard mid vastus approach. The deep fibers of the medial collateral ligament were elevated in a subperiosteal fashion off of the medial flare of the tibia so as to maintain a continuous soft tissue sleeve. The patella was subluxed laterally and the patellofemoral ligament was incised. Inspection of the knee demonstrated severe degenerative changes with full-thickness loss of articular cartilage. Osteophytes were debrided using a rongeur. Anterior and posterior cruciate ligaments were excised. Two 4.0 mm Schanz pins were inserted in the femur and into the tibia for attachment of the array of trackers used for computer-assisted navigation. Hip center was identified using a circumduction technique. Distal landmarks were mapped using the computer. The distal femur and proximal tibia were mapped using the computer. The distal femoral cutting guide was positioned using computer-assisted navigation so as to achieve a 5 distal valgus cut. The femur was sized and it was felt that a size 5 femoral component was appropriate. A size 5 femoral cutting guide was positioned and the anterior cut was performed and verified using the computer. This was followed by completion of the posterior and chamfer cuts. Femoral cutting guide for the central box was then positioned in the center box cut was performed.  Attention was then directed to the proximal tibia. Medial and lateral menisci were excised. The extramedullary tibial cutting guide was positioned using computer-assisted navigation so as to achieve a 0 varus-valgus alignment and 3 posterior slope. The cut was  performed and verified using the computer. The proximal tibia was  sized and it was felt that a size 6 tibial tray was appropriate. Tibial and femoral trials were inserted followed by insertion of a 14 mm polyethylene insert. This allowed for excellent mediolateral soft tissue balancing both in flexion and in full extension. Finally, the patella was cut and prepared so as to accommodate a 38 mm medialized dome patella. A patella trial was placed and the knee was placed through a range of motion with excellent patellar tracking appreciated. The femoral trial was removed after debridement of posterior osteophytes. The central post-hole for the tibial component was reamed followed by insertion of a keel punch. Tibial trials were then removed. Cut surfaces of bone were irrigated with copious amounts of normal saline with antibiotic solution using pulsatile lavage and then suctioned dry. Polymethylmethacrylate cement with gentamicin was prepared in the usual fashion using a vacuum mixer. Cement was applied to the cut surface of the proximal tibia as well as along the undersurface of a size 6 rotating platform tibial component. Tibial component was positioned and impacted into place. Excess cement was removed using [employer] fast streamer. Cement was then applied to the cut surfaces of the femur as well as along the posterior flanges of the size 5 femoral component. The femoral component was positioned and impacted into place. Excess cement was removed using [employer] fast streamer. A 14 mm polyethylene trial was inserted and the knee was brought into full extension with steady axial compression applied. Finally, cement was applied to the backside of a 38 mm medialized dome patella and the patellar component was positioned and patellar clamp applied. Excess cement was removed using [employer] fast streamer. After adequate curing of the cement, the tourniquet was deflated after a total tourniquet time of 120 minutes. Hemostasis was achieved using  electrocautery. The knee was irrigated with copious amounts of normal saline with antibiotic solution using pulsatile lavage and then suctioned dry. 20 mL of 1.3% Exparel and 60 mL of 0.25% Marcaine in 40 mL of normal saline was injected along the posterior capsule, medial and lateral gutters, and along the arthrotomy site. A 12 mm stabilized rotating platform polyethylene insert was inserted and the knee was placed through a range of motion with excellent mediolateral soft tissue balancing appreciated and excellent patellar tracking noted. 2 medium drains were placed in the wound bed and brought out through separate stab incisions. The medial parapatellar portion of the incision was reapproximated using interrupted sutures of #1 Vicryl. Subcutaneous tissue was approximated in layers using first #0 Vicryl followed #2-0 Vicryl. The skin was approximated with skin staples. A sterile dressing was applied.  The patient tolerated the procedure well and was transported to the recovery room in stable condition.    Rosana Farnell P. Holley Bouche., M.D.

## 2020-01-29 ENCOUNTER — Encounter: Payer: Self-pay | Admitting: Orthopedic Surgery

## 2020-01-29 LAB — GLUCOSE, CAPILLARY
Glucose-Capillary: 240 mg/dL — ABNORMAL HIGH (ref 70–99)
Glucose-Capillary: 201 mg/dL — ABNORMAL HIGH (ref 70–99)
Glucose-Capillary: 226 mg/dL — ABNORMAL HIGH (ref 70–99)
Glucose-Capillary: 222 mg/dL — ABNORMAL HIGH (ref 70–99)

## 2020-01-29 NOTE — Progress Notes (Addendum)
Physical Therapy Treatment Patient Details Name: Daniel Kline MRN: [id number] DOB: 1946-04-21 Today's Date: 01/29/2020    History of Present Illness Pt is a 74 y/o man POD 1 s/p R TKA. PMH includes anemia, CKD stage III, cutaneous T-cell lymphoma, DM II, essential HTN, benign, obstructive sleep apnea, OA, GERD, polio (R arm affected), and anxiety.    PT Comments    Pt received in recliner chair and agreeable to participate in PT session. Pt's BP more appropriate this afternoon for OOB and upright activities: LUE 147/84. Pt ambulated 160 feet feet using RW with platform for RUE, which was adjusted to proper height for improved upright posture. CGA for steadying. Pt ambulates with decreased gait speed however exhibits reciprocal gait pattern. Pt performed sit <> stand transfers with CGA for steadying and required verbal cues for hand and foot placement for increased safety. Pt given exercise booklet and educated on. Will continue to progress as tolerated during this acute stay.   This session and note seen by [name] Noon PTA, agree with assessment.   Follow Up Recommendations  Home health PT     Equipment Recommendations  3in1 (PT);Other (comment)    Recommendations for Other Services       Precautions / Restrictions Precautions Precautions: Fall Restrictions RLE Weight Bearing: Weight bearing as tolerated    Mobility  Bed Mobility               General bed mobility comments: Not assessed as pt seated in recliner chair at arrival and at end of session.  Transfers Overall transfer level: Needs assistance Equipment used: Rolling walker (2 wheeled) (w/ R platform) Transfers: Sit to/from Stand Sit to Stand: Min guard         General transfer comment: CGA for steadying during transfer; occassional verbal cues for hand and foot placement  Ambulation/Gait Ambulation/Gait assistance: Min guard Gait Distance (Feet): 160 Feet Assistive device: Rolling walker (2  wheeled) Gait Pattern/deviations: Step-through pattern Gait velocity: decreased   General Gait Details: CGA for safety and steadying; noted reciprocal pattern with heel strike and toe off   Stairs             Wheelchair Mobility    Modified Rankin (Stroke Patients Only)       Balance                                            Cognition Arousal/Alertness: Awake/alert Behavior During Therapy: WFL for tasks assessed/performed Overall Cognitive Status: Within Functional Limits for tasks assessed                                        Exercises Other Exercises Other Exercises: LAQ x 10 seated in recliner chair post ambulation distance. Pt educated on exercise packet and pt verbalized understanding.    General Comments        Pertinent Vitals/Pain Pain Assessment: 0-10 Pain Score: 2  Pain Location: R knee Pain Descriptors / Indicators: Sore;Discomfort Pain Intervention(s): Monitored during session;Ice applied;Repositioned    Home Living                      Prior Function            PT Goals (current goals can now be found in  the care plan section) Acute Rehab PT Goals Patient Stated Goal: to go home PT Goal Formulation: With patient Time For Goal Achievement: 02/12/20 Potential to Achieve Goals: Good Progress towards PT goals: Progressing toward goals    Frequency    BID      PT Plan Current plan remains appropriate    Co-evaluation              AM-PAC PT "6 Clicks" Mobility   Outcome Measure  Help needed turning from your back to your side while in a flat bed without using bedrails?: A Little Help needed moving from lying on your back to sitting on the side of a flat bed without using bedrails?: A Little Help needed moving to and from a bed to a chair (including a wheelchair)?: A Little Help needed standing up from a chair using your arms (e.g., wheelchair or bedside chair)?: A Little Help  needed to walk in hospital room?: A Little Help needed climbing 3-5 steps with a railing? : A Lot 6 Click Score: 17    End of Session Equipment Utilized During Treatment: Gait belt Activity Tolerance: Patient tolerated treatment well Patient left: in chair;with call bell/phone within reach;with chair alarm set;with SCD's reapplied Nurse Communication: Mobility status PT Visit Diagnosis: Unsteadiness on feet (R26.81);Muscle weakness (generalized) (M62.81);Pain Pain - Right/Left: Right Pain - part of body: Knee     Time: 1344-1410 PT Time Calculation (min) (ACUTE ONLY): 26 min  Charges:                       Vale Haven, SPT   Vale Haven 01/29/2020, 3:23 PM

## 2020-01-29 NOTE — Plan of Care (Signed)
  Problem: Education: Goal: Knowledge of General Education information will improve Description: Including pain rating scale, medication(s)/side effects and non-pharmacologic comfort measures Outcome: Progressing   Problem: Clinical Measurements: Goal: Will remain free from infection Outcome: Progressing Goal: Respiratory complications will improve Outcome: Progressing Goal: Cardiovascular complication will be avoided Outcome: Progressing   Problem: Nutrition: Goal: Adequate nutrition will be maintained Outcome: Progressing   Problem: Coping: Goal: Level of anxiety will decrease Outcome: Progressing

## 2020-01-29 NOTE — Discharge Summary (Signed)
Physician Discharge Summary  Patient ID: Daniel Kline MRN: [id number] DOB/AGE: 11/22/1945 74 y.o.  Admit date: 01/28/2020 Discharge date: 01/30/2020  Admission Diagnoses:  Total knee replacement status [Z96.659]  Surgeries:Procedure(s): Right total knee arthroplasty using computer-assisted navigation  SURGEON:  Marciano Sequin. M.D.  ASSISTANT: Cassell Smiles, PA-C (present and scrubbed throughout the case, critical for assistance with exposure, retraction, instrumentation, and closure)  ANESTHESIA: spinal  ESTIMATED BLOOD LOSS: 50 mL  FLUIDS REPLACED: 1000 mL of crystalloid  TOURNIQUET TIME: 120 minutes  DRAINS: 2 medium Hemovac drains  SOFT TISSUE RELEASES: Anterior cruciate ligament, posterior cruciate ligament, deep medial collateral ligament, patellofemoral ligament  IMPLANTS UTILIZED: DePuy Attune size 5 posterior stabilized femoral component (cemented), size 6 rotating platform tibial component (cemented), 38 mm medialized dome patella (cemented), and a 12 mm stabilized rotating platform polyethylene insert.  Discharge Diagnoses: Patient Active Problem List   Diagnosis Date Noted  . Total knee replacement status 01/28/2020  . Encounter for long-term (current) use of high-risk medication 06/14/2019  . Seronegative rheumatoid arthritis ([place]) 06/14/2019  . Chronic narcotic dependence ([place]) 05/18/2019  . Anti-cyclic citrullinated peptide antibody positive 05/03/2019  . Synovitis of hand 05/03/2019  . Radial neuropathy, left 09/06/2018  . Type 2 diabetes mellitus, uncontrolled, with renal complications ([place]) 18/56/3149  . Chronic bilateral low back pain without sciatica 06/09/2018  . Knee pain, chronic 04/12/2018  . OSA (obstructive sleep apnea) 09/07/2017  . History of poliomyelitis 08/27/2017  . Localized edema 08/12/2017  . Actinic keratosis 07/26/2017  . Cutaneous horn 07/26/2017  . History of total hip arthroplasty, bilateral 06/09/2016  . GERD  (gastroesophageal reflux disease) 04/21/2016  . PONV (postoperative nausea and vomiting) 04/21/2016  . Skin cancer 04/21/2016  . Asymptomatic gallstones 01/06/2016  . Atrial flutter ([place]) 10/29/2015  . Venous stasis dermatitis of both lower extremities 10/29/2015  . Chronic kidney disease (CKD), stage III (moderate) ([place]) 09/27/2015  . Morbid obesity ([place]) 09/27/2015  . Primary osteoarthritis of right knee 08/26/2015  . Cutaneous T-cell lymphoma ([place]) 12/20/2011  . Hypercholesterolemia 12/20/2011  . Infection and inflammatory reaction due to internal prosthetic device, implant, and graft 12/20/2011  . Essential hypertension 01/12/2010    Past Medical History:  Diagnosis Date  . Anemia    H/O  . Anxiety   . Arthritis   . Chronic kidney disease   . Complication of anesthesia   . 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 Diabetes mellitus without complication ([place])   . Dysrhythmia    A FLUTTER  . Family history of adverse reaction to anesthesia    PT WAS ADOPTED  . HLD (hyperlipidemia)   . HTN (hypertension)   . HTN (hypertension)   . Hx of dysplastic nevus 2019   multiple sites  . 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 MRSA (methicillin resistant Staphylococcus aureus)    after back surgery  . OSA (obstructive sleep apnea)    USES BIPAP  . Polio   . 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 PONV (postoperative nausea and vomiting)      Transfusion:    Consultants (if any):   Discharged Condition: Improved  Hospital Course: Daniel Kline is an 74 y.o. male who was admitted 01/28/2020 with a diagnosis of right knee osteoarthritis and went to the operating room on 01/28/2020 and underwent right total knee arthroplasty. The patient received perioperative antibiotics for prophylaxis (see below). The patient tolerated the procedure well and  was transported to PACU in stable condition. After meeting PACU criteria, the patient was  subsequently transferred to the Orthopaedics/Rehabilitation unit.   The patient received DVT prophylaxis in the form of early mobilization, Lovenox, Foot Pumps and TED hose. A sacral pad had been placed and heels were elevated off of the bed with rolled towels in order to protect skin integrity. Foley catheter was discontinued on postoperative day #0. Wound drains were discontinued on postoperative day #2. The surgical incision was healing well without signs of infection.  Physical therapy was initiated postoperatively for transfers, gait training, and strengthening. Occupational therapy was initiated for activities of daily living and evaluation for assisted devices. Rehabilitation goals were reviewed in detail with the patient. The patient made steady progress with physical therapy and physical therapy recommended discharge to Home.   The patient achieved the preliminary goals of this hospitalization and was felt to be medically and orthopaedically appropriate for discharge.  He was given perioperative antibiotics:  Anti-infectives (From admission, onward)   Start     Dose/Rate Route Frequency Ordered Stop   01/28/20 1830  ceFAZolin (ANCEF) IVPB 2g/100 mL premix        2 g 200 mL/hr over 30 Minutes Intravenous Every 6 hours 01/28/20 1755 01/29/20 1316   01/28/20 0600  ceFAZolin (ANCEF) IVPB 2g/100 mL premix        2 g 200 mL/hr over 30 Minutes Intravenous On call to O.R. 01/27/20 2355 01/28/20 1225    .  Recent vital signs:  Vitals:   01/30/20 0425 01/30/20 0743  BP: (!) 145/92 (!) 160/85  Pulse: (!) 51 (!) 53  Resp:  16  Temp:  98.6 F (37 C)  SpO2:  97%    Recent laboratory studies:  No results for input(s): WBC, HGB, HCT, PLT, K, CL, CO2, BUN, CREATININE, GLUCOSE, CALCIUM, LABPT, INR in the last 72 hours.  Diagnostic Studies: DG Knee Right Port  Result Date: 01/28/2020 CLINICAL DATA:  74 year old male with right knee replacement. EXAM: PORTABLE RIGHT KNEE - 1-2 VIEW  COMPARISON:  Right knee radiograph dated 06/10/2006. FINDINGS: There is a total right knee arthroplasty. The arthroplasty components appear intact and in anatomic alignment. There is no acute fracture or dislocation. A drainage catheter is noted with tip at the patellofemoral compartment. Postsurgical changes and anterior knee cutaneous clips. IMPRESSION: Status post total right knee arthroplasty. Electronically Signed   By: Anner Crete M.D.   On: 01/28/2020 16:50    Discharge Medications:   Allergies as of 01/30/2020      Reactions   Bee Venom Anaphylaxis   Oxycodone Other (See Comments)   Delusions   Hydromorphone Other (See Comments)   hallucinating   Zolpidem Other (See Comments)      Medication List    STOP taking these medications   HYDROcodone-acetaminophen 5-325 MG tablet Commonly known as: NORCO/VICODIN   leflunomide 20 MG tablet Commonly known as: ARAVA     TAKE these medications   amLODipine 10 MG tablet Commonly known as: NORVASC TAKE 1 TABLET BY MOUTH  DAILY   carvedilol 12.5 MG tablet Commonly known as: COREG Take 1 tablet (12.5 mg total) by mouth 2 (two) times daily.   celecoxib 200 MG capsule Commonly known as: CELEBREX Take 1 capsule (200 mg total) by mouth 2 (two) times daily.   clobetasol cream 0.05 % Commonly known as: TEMOVATE Apply 1 application topically 2 (two) times daily as needed (irritation).   clorazepate 7.5 MG tablet Commonly known as: TRANXENE  TAKE 1 TABLET (7.5 MG TOTAL) BY MOUTH 2 (TWO) TIMES DAILY AS NEEDED FOR ANXIETY.   enoxaparin 40 MG/0.4ML injection Commonly known as: LOVENOX Inject 0.4 mLs (40 mg total) into the skin daily for 14 days.   EPINEPHrine 0.3 mg/0.3 mL Soaj injection Commonly known as: EPI-PEN Inject 0.3 mLs (0.3 mg total) into the muscle as needed for anaphylaxis.   furosemide 40 MG tablet Commonly known as: LASIX Take 40 mg by mouth 2 (two) times daily.   gabapentin 300 MG capsule Commonly known as:  NEURONTIN TAKE 1 CAPSULE BY MOUTH 3  TIMES DAILY   glipiZIDE 5 MG tablet Commonly known as: GLUCOTROL TAKE ONE-HALF TABLET BY  MOUTH 2 TIMES DAILY BEFORE  A MEAL. What changed: See the new instructions.   hydrOXYzine 25 MG tablet Commonly known as: ATARAX/VISTARIL TAKE ONE TABLET EVERY EIGHT HOURS AS NEEDED What changed: See the new instructions.   IRON PO Take 1 tablet by mouth at bedtime.   isosorbide mononitrate 60 MG 24 hr tablet Commonly known as: IMDUR Take 60 mg by mouth daily before lunch.   liraglutide 18 MG/3ML Sopn Commonly known as: VICTOZA Inject 0.2 mLs (1.2 mg total) into the skin daily.   losartan 100 MG tablet Commonly known as: COZAAR Take 1 tablet (100 mg total) by mouth daily. What changed: when to take this   Medical Compression Stockings Misc 2 Units by Does not apply route daily.   metFORMIN 500 MG tablet Commonly known as: GLUCOPHAGE TAKE 1 TABLET BY MOUTH TWO  TIMES DAILY What changed: when to take this   Multivital tablet Take 1 tablet by mouth daily.   OneTouch Ultra test strip Generic drug: glucose blood CHECK BLOOD SUGAR 3 TIMES DAILY AS DIRECTED   oxyCODONE 5 MG immediate release tablet Commonly known as: Oxy IR/ROXICODONE Take 1 tablet (5 mg total) by mouth every 4 (four) hours as needed for moderate pain (pain score 4-6).   rosuvastatin 20 MG tablet Commonly known as: CRESTOR TAKE ONE TABLET EVERY DAY   timolol 0.25 % ophthalmic solution Commonly known as: BETIMOL Place 1-2 drops into both eyes 2 (two) times daily.   timolol 0.5 % ophthalmic solution Commonly known as: TIMOPTIC Apply 1 drop to eye 2 (two) times daily.   traMADol 50 MG tablet Commonly known as: ULTRAM Take 1 tablet (50 mg total) by mouth every 4 (four) hours as needed for moderate pain.   VITAMIN D-3 PO Take 10,000 Units by mouth daily.            Durable Medical Equipment  (From admission, onward)         Start     Ordered   01/28/20 1756   DME Walker rolling  Once       Question:  Patient needs a walker to treat with the following condition  Answer:  Total knee replacement status   01/28/20 1755   01/28/20 1756  DME Bedside commode  Once       Question:  Patient needs a bedside commode to treat with the following condition  Answer:  Total knee replacement status   01/28/20 1755          Disposition: home with home health PT      Follow-up Information    [facility] On 02/12/2020.   Specialty: Orthopedic Surgery Why: at 1:15pm Contact information: [address] [place] [address] [phone]  Dereck Leep, MD On 03/11/2020.   Specialty: Orthopedic Surgery Why: at 3:00pm Contact information: [place] [address] [place], PA-C 01/30/2020, 11:14 AM

## 2020-01-29 NOTE — Evaluation (Signed)
Physical Therapy Evaluation Patient Details Name: Daniel Kline MRN: [id number] DOB: 28-Nov-1945 Today's Date: 01/29/2020   History of Present Illness  Pt is a 74 y/o man POD 1 s/p R TKA. PMH includes anemia, CKD stage III, cutaneous T-cell lymphoma, DM II, essential HTN, benign, obstructive sleep apnea, OA, GERD, polio (R arm affected), and anxiety.  Clinical Impression  Pt is a pleasant and verbose gentleman who was in bed upon arrival to room. Pt agreeable to PT evalution. Pt reports falling twice in this year, one situation having fallen off a curb. Pt reported using crutches for 3 weeks prior to this admission however no AD to ambulate community distances prior to that. Pt's BP elevated this morning therefore deferred OOB activities. BP in bed: 187/98; in sitting: 204/96 LUE. Pt participated in therex in bed and supine <> sit to edge of bed, requiring no physical assistance for RLE management. Pt has residual deficits on RUE from polio limiting ROM and, hence, correct RW usage. Recommend platform attachment for RW. Pt exhibits decreased ROM and strength at this time warranting skilled therapy to address current deficits. Recommend HHPT at discharge to maximize functional independence and return to PLOF.    Follow Up Recommendations Home health PT    Equipment Recommendations  3in1 (PT);Other (comment) (platform x 1)    Recommendations for Other Services       Precautions / Restrictions Precautions Precautions: Fall Restrictions Weight Bearing Restrictions: Yes RLE Weight Bearing: Weight bearing as tolerated      Mobility  Bed Mobility Overal bed mobility: Modified Independent             General bed mobility comments: Pt required increased time to bring trunk into full upright sitting with use of bedrail. Assist for IV line and hemovac management; able to scoot hips along side of bed for improved positioning prior to sit to supine with SBA for safety  Transfers                  General transfer comment: TFs deferred secondary to increased BP this morning (reaching 204/96 in sitting).  Ambulation/Gait                Stairs            Wheelchair Mobility    Modified Rankin (Stroke Patients Only)       Balance                                             Pertinent Vitals/Pain Pain Assessment: 0-10 Pain Score: 2  Pain Location: R knee Pain Descriptors / Indicators: Sore;Discomfort Pain Intervention(s): Monitored during session;Ice applied;RN gave pain meds during session    [facility] expects to be discharged to:: Private residence Living Arrangements: Spouse/significant other Available Help at Discharge: Family;Available 24 hours/day Type of Home: House Home Access: Stairs to enter Entrance Stairs-Rails: Can reach both Entrance Stairs-Number of Steps: 4 Home Layout: One level Home Equipment: Walker - 2 wheels;Crutches      Prior Function Level of [place]: Independent with assistive device(s)         Comments: PT reports usage of crutches x 3 weeks but no device usage prior to that.     Hand Dominance   Dominant Hand: Left (d/t polio affecting RUE)    Extremity/Trunk Assessment   Upper Extremity Assessment Upper Extremity Assessment:  RUE deficits/detail RUE Deficits / Details: Pt has history of polio, which affected his RUE. MMT is 3/5 generally. RUE remains flexed at elbow and decreased ROM compared to LUE.    Lower Extremity Assessment Lower Extremity Assessment: RLE deficits/detail RLE Deficits / Details: POD 1 s/p R TKA; pt able to perform SLR independently RLE: Unable to fully assess due to immobilization (ace wrap post surgery)       Communication   Communication: No difficulties  Cognition Arousal/Alertness: Awake/alert Behavior During Therapy: WFL for tasks assessed/performed Overall Cognitive Status: Within Functional Limits for tasks assessed                                         General Comments      Exercises Total Joint Exercises Ankle Circles/Pumps: AROM;Both;10 reps;Supine (HOB elevated) Quad Sets: AROM;10 reps;Right;Supine (HOB elevated) Hip ABduction/ADduction: AROM;Right;10 reps;Supine (HOB elevated) Straight Leg Raises: AROM;Right;10 reps;Supine (HOB elevated) Goniometric ROM: 0-95 degrees AROM Other Exercises Other Exercises: RN at bedside to assess BP due to elevation, and administer BP medications; pt sat EOB for several minutes   Assessment/Plan    PT Assessment Patient needs continued PT services  PT Problem List Decreased strength;Decreased range of motion;Decreased balance;Decreased mobility;Decreased activity tolerance;Pain       PT Treatment Interventions DME instruction;Gait training;Stair training;Functional mobility training;Therapeutic activities;Therapeutic exercise;Balance training;Patient/family education    PT Goals (Current goals can be found in the Care Plan section)  Acute Rehab PT Goals Patient Stated Goal: to go home PT Goal Formulation: With patient Time For Goal Achievement: 02/12/20 Potential to Achieve Goals: Good Additional Goals Additional Goal #1: Pt will perform transfers from various surfaces and ambulate >200' using RW with no more than CGA. (for improved functional mobility)    Frequency BID   Barriers to discharge        Co-evaluation               AM-PAC PT "6 Clicks" Mobility  Outcome Measure Help needed turning from your back to your side while in a flat bed without using bedrails?: A Little Help needed moving from lying on your back to sitting on the side of a flat bed without using bedrails?: A Little Help needed moving to and from a bed to a chair (including a wheelchair)?: A Little Help needed standing up from a chair using your arms (e.g., wheelchair or bedside chair)?: A Little Help needed to walk in hospital room?: A Little Help needed  climbing 3-5 steps with a railing? : A Lot 6 Click Score: 17    End of Session   Activity Tolerance: Treatment limited secondary to medical complications (Comment) (elevated BP) Patient left: in bed;with call bell/phone within reach;with bed alarm set;with nursing/sitter in room;with SCD's reapplied Nurse Communication: Mobility status PT Visit Diagnosis: Unsteadiness on feet (R26.81);Muscle weakness (generalized) (M62.81);Pain Pain - Right/Left: Right Pain - part of body: Knee    Time: 2010-0712 PT Time Calculation (min) (ACUTE ONLY): 43 min   Charges:     PT Treatments $Therapeutic Exercise: 23-37 mins        Daniel Kline, SPT  Daniel Kline 01/29/2020, 12:41 PM

## 2020-01-29 NOTE — TOC Progression Note (Signed)
Transition of Care [facility]) - Progression Note    Patient Details  Name: Daniel Kline MRN: [id number] Date of Birth: 06/22/1946  [facility] of [facility]) CM/SW [place], RN Phone Number: 01/29/2020, 10:59 AM  Clinical Narrative:     I requested the price of Lovenox and will notify the patient once obtained He is set up with [facility] for [facility] services       Expected Discharge Plan and Services                                                 Social Determinants of Health (SDOH) Interventions    Readmission Risk Interventions No flowsheet data found.

## 2020-01-29 NOTE — TOC Initial Note (Signed)
Transition of Care [facility]) - Initial/Assessment Note    Patient Details  Name: Daniel Kline MRN: [id number] Date of Birth: [birth year]  Transition of Care [name]) CM/SW Contact:    Su Hilt, RN Phone Number: 01/29/2020, 2:25 PM  Clinical Narrative:                 Met with the patient to discuss DC plan and needs, he lives at home with his wife, He has a raised toilet seat and cane at home He needs a rolling walker with a platform for the right side I notified Zack with [employer] He is set up with [facility] He is up to date with PCP He has transportation set up with family, he can afford his medications  No additional needs at this time    Expected Discharge Plan: [place] Barriers to Discharge: Continued Medical Work up   Patient Goals and CMS Choice Patient states their goals for this hospitalization and ongoing recovery are:: go home      Expected Discharge Plan and Services Expected Discharge Plan: [place]   Discharge Planning Services: CM Consult   Living arrangements for the past 2 months: Single Family Home                 DME Arranged: Walker rolling (right sided platform) DME Agency: [employer] Date DME Agency Contacted: 01/29/20 Time DME Agency Contacted: [phone] Representative spoke with at DME Agency: Zack [place] Arranged: PT [place] Agency: [facility]formerly [employer]) Date [place]: 01/29/20 Time [place]: 4 Representative spoke with at [place]: Helene Kelp  Prior Living Arrangements/Services Living arrangements for the past 2 months: [place] Lives with:: Spouse Patient language and need for interpreter reviewed:: Yes Do you feel safe going back to the place where you live?: Yes      Need for Family Participation in Patient Care: No (Comment) Care giver support system in place?: Yes (comment) Current home services: DME (cane, Rasied toilet  seat) Criminal Activity/Legal Involvement Pertinent to Current Situation/Hospitalization: No - Comment as needed  Activities of Daily Living Home Assistive Devices/Equipment: CPAP, Wound Vac ADL Screening (condition at time of admission) Patient's cognitive ability adequate to safely complete daily activities?: No Is the patient deaf or have difficulty hearing?: No Does the patient have difficulty seeing, even when wearing glasses/contacts?: No Does the patient have difficulty concentrating, remembering, or making decisions?: No Patient able to express need for assistance with ADLs?: No Does the patient have difficulty dressing or bathing?: No Independently performs ADLs?: Yes (appropriate for developmental age) Weakness of Legs: Right Weakness of Arms/Hands: Right  Permission Sought/Granted   Permission granted to share information with : Yes, Verbal Permission Granted              Emotional Assessment Appearance:: Appears stated age Attitude/Demeanor/Rapport: Engaged Affect (typically observed): Appropriate Orientation: : Oriented to Self, Oriented to Place, Oriented to  Time, Oriented to Situation Alcohol / Substance Use: Not Applicable Psych Involvement: No (comment)  Admission diagnosis:  Total knee replacement status [Z96.659] Patient Active Problem List   Diagnosis Date Noted   Total knee replacement status 01/28/2020   Encounter for long-term (current) use of high-risk medication 06/14/2019   Seronegative rheumatoid arthritis ([place]) 06/14/2019   Chronic narcotic dependence ([place]) 71/21/9758   Anti-cyclic citrullinated peptide antibody positive 05/03/2019   Synovitis of hand 05/03/2019   Radial neuropathy, left 09/06/2018  Type 2 diabetes mellitus, uncontrolled, with renal complications (HCC) 64/66/0563   Chronic bilateral low back pain without sciatica 06/09/2018   Knee pain, chronic 04/12/2018   OSA (obstructive sleep apnea) 09/07/2017   History of  poliomyelitis 08/27/2017   Localized edema 08/12/2017   Actinic keratosis 07/26/2017   Cutaneous horn 07/26/2017   History of total hip arthroplasty, bilateral 06/09/2016   GERD (gastroesophageal reflux disease) 04/21/2016   PONV (postoperative nausea and vomiting) 04/21/2016   Skin cancer 04/21/2016   Asymptomatic gallstones 01/06/2016   Atrial flutter ([place]) 10/29/2015   Venous stasis dermatitis of both lower extremities 10/29/2015   Chronic kidney disease (CKD), stage III (moderate) ([place]) 09/27/2015   Morbid obesity ([place]) 09/27/2015   Primary osteoarthritis of right knee 08/26/2015   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   Hypercholesterolemia 12/20/2011   Infection and inflammatory reaction due to internal prosthetic device, implant, and graft 12/20/2011   Essential hypertension 01/12/2010   PCP:  [name], MD Pharmacy:   [place], [place] - [address] [place] [address] Phone: [phone] Fax: [phone]  [place], [address], [address] [place], [address] [place] [address] Phone: [phone] Fax: [phone]  [id number] [place]: [phone]: [phone]    Social Determinants of Health (SDOH) Interventions    Readmission Risk Interventions No flowsheet data found.

## 2020-01-29 NOTE — Progress Notes (Signed)
Received report from Bet, RN. Assuming care of patient at this time. Patient is Alert and Oriented x 4 and is not show any signs of distress. Vital signs WNL. Call bell in reach. Wife at bedside.

## 2020-01-29 NOTE — Evaluation (Signed)
Occupational Therapy Evaluation Patient Details Name: Daniel Kline MRN: [id number] DOB: [birth year] Today's Date: 01/29/2020    History of Present Illness Pt is a 74 y/o man POD 1 s/p R TKA. PMH includes anemia, CKD stage III, cutaneous T-cell lymphoma, DM II, essential HTN, benign, obstructive sleep apnea, OA, GERD, polio (R arm affected), and anxiety.   Clinical Impression   Pt seen for OT evaluation this date, POD#1 from above surgery. Pt was independent in all ADLs prior to surgery, however recently using crutches for fxl mobility d/t R knee pain. Pt is eager to return to PLOF with less pain and improved safety and independence. Pt currently requires MOD Assist for LB dressing while in seated position due to pain and limited AROM of R knee. Pt instructed in polar care mgt, falls prevention strategies, home/routines modifications, DME/AE for LB bathing and dressing tasks, and compression stocking mgt. Handout issued. Pt with good reception, but expresses concerns with managing polar care. Pt would benefit from skilled OT services including additional instruction in dressing techniques with or without assistive devices for dressing and bathing skills to support recall and carryover prior to discharge and ultimately to maximize safety, independence, and minimize falls risk and caregiver burden. Anticipate that pt could benefit from [facility] follow up.     Follow Up Recommendations  Home health OT;Supervision - Intermittent    Equipment Recommendations  3 in 1 bedside commode    Recommendations for Other Services       Precautions / Restrictions Precautions Precautions: Fall Restrictions Weight Bearing Restrictions: Yes RLE Weight Bearing: Weight bearing as tolerated      Mobility Bed Mobility Overal bed mobility: Modified Independent             General bed mobility comments: Not assessed as pt seated in recliner chair at arrival and at end of session.  Transfers Overall  transfer level: Needs assistance Equipment used: Rolling walker (2 wheeled) (w/ R platform) Transfers: Sit to/from Stand Sit to Stand: Min assist         General transfer comment: MIN verbal cues for safe hand placement relative to RW on sit<>stand    Balance Overall balance assessment: Needs assistance Sitting-balance support: No upper extremity supported;Feet supported Sitting balance-Leahy Scale: Good     Standing balance support: Bilateral upper extremity supported;During functional activity Standing balance-Leahy Scale: Fair Standing balance comment: CGA to SBA req'ed with static stand, CGA for fxl mobility, B UE support (R platform)                           ADL either performed or assessed with clinical judgement   ADL Overall ADL's : Needs assistance/impaired Eating/Feeding: Independent;Sitting   Grooming: Wash/dry hands;Wash/dry face;Oral care;Set up;Sitting           Upper Body Dressing : Minimal assistance;Sitting   Lower Body Dressing: Moderate assistance;Sit to/from stand   Toilet Transfer: Minimal assistance;Stand-pivot;RW           Functional mobility during ADLs: Min guard;Minimal assistance;Rolling walker       Vision Patient Visual Report: No change from baseline       Perception     Praxis      Pertinent Vitals/Pain Pain Assessment: 0-10 Pain Score: 2  Pain Location: R knee Pain Descriptors / Indicators: Sore;Discomfort Pain Intervention(s): Monitored during session;Repositioned     Hand Dominance Left (h/o polio impacting R UE)   Extremity/Trunk Assessment Upper Extremity Assessment Upper Extremity  Assessment: RUE deficits/detail;LUE deficits/detail RUE Deficits / Details: Pt with h/o Polio impacting ROM, shld flexion to 1/2 range, keeps elbow in flexed positiong primarily. MMT 3-/5 LUE Deficits / Details: ROM and strength WFL   Lower Extremity Assessment Lower Extremity Assessment: Defer to PT evaluation RLE:  Unable to fully assess due to immobilization (ace wrapped)       Communication Communication Communication: No difficulties   Cognition Arousal/Alertness: Awake/alert Behavior During Therapy: WFL for tasks assessed/performed Overall Cognitive Status: Within Functional Limits for tasks assessed                                     General Comments       Exercises Exercises: Other exercises Other Exercises Other Exercises: OT facilitates education with pt re: polar care mgt, compression stocking mgt, AE for LB ADLs including sock aide, reacher, LH sponge and LH shoehorn. Pt with good reception, some confusion r/t polar mgt. Would benefit from f/u. Handout issued with instructions.   Shoulder Instructions      Home Living Family/patient expects to be discharged to:: Private residence Living Arrangements: Spouse/significant other Available Help at Discharge: Family;Available 24 hours/day Type of Home: House Home Access: Stairs to enter [employer] of Steps: 4 Entrance Stairs-Rails: Can reach both Home Layout: One level     Bathroom Shower/Tub: Occupational psychologist: Standard Bathroom Accessibility: Yes   Home Equipment: Environmental consultant - 2 wheels;Crutches;Shower seat - built in   Additional Comments: Pt reports usage of crutches x 3 weeks but no device usage prior to that.      Prior Functioning/Environment Level of Independence: Independent with assistive device(s)        Comments: Pt reports active lifestyle. Typically able to do all self care and all IADLs I'ly as well, ex: mowing the lawn with a push mower.        OT Problem List: Decreased strength;Decreased range of motion;Decreased activity tolerance;Impaired balance (sitting and/or standing);Decreased knowledge of use of DME or AE      OT Treatment/Interventions: Self-care/ADL training;Therapeutic exercise;Energy conservation;DME and/or AE instruction;Therapeutic  activities;Balance training;Patient/family education    OT Goals(Current goals can be found in the care plan section) Acute Rehab OT Goals Patient Stated Goal: to go home OT Goal Formulation: With patient Time For Goal Achievement: 02/12/20 Potential to Achieve Goals: Good  OT Frequency: Min 2X/week   Barriers to D/C:            Co-evaluation              AM-PAC OT "6 Clicks" Daily Activity     Outcome Measure Help from another person eating meals?: None Help from another person taking care of personal grooming?: A Little Help from another person toileting, which includes using toliet, bedpan, or urinal?: A Lot Help from another person bathing (including washing, rinsing, drying)?: A Lot Help from another person to put on and taking off regular upper body clothing?: A Little Help from another person to put on and taking off regular lower body clothing?: A Lot 6 Click Score: 16   End of Session Equipment Utilized During Treatment: Gait belt;Rolling walker;Other (comment) (R platform) Nurse Communication: Mobility status  Activity Tolerance: Patient tolerated treatment well Patient left: in chair;with call bell/phone within reach;with chair alarm set  OT Visit Diagnosis: Unsteadiness on feet (R26.81);Other abnormalities of gait and mobility (R26.89);Muscle weakness (generalized) (M62.81)  Time: 7207-2182 OT Time Calculation (min): 53 min Charges:  OT General Charges $OT Visit: 1 Visit OT Evaluation $OT Eval Moderate Complexity: 1 Mod OT Treatments $Self Care/Home Management : 23-37 mins $Therapeutic Activity: 8-22 mins  Gerrianne Scale, MS, OTR/L ascom [phone] 01/29/20, 4:42 PM

## 2020-01-29 NOTE — Progress Notes (Signed)
ORTHOPAEDICS PROGRESS NOTE  PATIENT NAME: Daniel Kline DOB: 01/06/1946  MRN: [id number]  POD # 1: Right total knee arthroplasty  Subjective: The patient rested well last night.  Pain is been under good control.  He denies any nausea or vomiting.  Objective: Vital signs in last 24 hours: Temp:  [97.2 F (36.2 C)-98.3 F (36.8 C)] 97.7 F (36.5 C) (06/22 0439) Pulse Rate:  [58-98] 70 (06/22 0439) Resp:  [9-24] 17 (06/22 0439) BP: (103-186)/(64-99) 169/99 (06/22 0439) SpO2:  [92 %-99 %] 98 % (06/22 0439) Weight:  [105.2 kg] 105.2 kg (06/21 1053)  Intake/Output from previous day: 06/21 0701 - 06/22 0700 In: 2458.7 [P.O.:30; I.V.:1728.7; IV Piggyback:700] Out: 2715 [Urine:2425; Drains:240; Blood:50]  No results for input(s): WBC, HGB, HCT, PLT, K, CL, CO2, BUN, CREATININE, GLUCOSE, CALCIUM, LABPT, INR in the last 72 hours.  EXAM General: Well-developed well-nourished male seen in no apparent discomfort. Lungs: clear to auscultation Cardiac: normal rate and regular rhythm Abdomen: Soft, nontender, nondistended.  Bowel sounds are present. Right lower extremity: Hemovac drain and Polar Care are in place and functioning.  Dressing is dry and intact.  The patient is able to perform an independent straight leg raise.  Homans test is negative. Neurologic: Awake, alert, and oriented.  Sensory and motor function are intact.  Assessment: Right total knee arthroplasty  Secondary diagnoses: Polio (right arm involvement) Obstructive sleep apnea Hypertension Hyperlipidemia Diabetes Cutaneous T-cell lymphoma Chronic kidney disease Anxiety Anemia  Plan: Today's goals were reviewed with the patient.  Begin physical therapy and Occupational Therapy as per total knee arthroplasty rehab protocol. Plan is to go Home after hospital stay. DVT Prophylaxis - Lovenox, Foot Pumps and TED hose  Betzy Barbier P. Holley Bouche M.D.

## 2020-01-30 LAB — GLUCOSE, CAPILLARY: Glucose-Capillary: 157 mg/dL — ABNORMAL HIGH (ref 70–99)

## 2020-01-30 MED ORDER — TRAMADOL HCL 50 MG PO TABS: 50.0000 mg | ORAL_TABLET | ORAL | 0 refills | Status: DC | PRN

## 2020-01-30 MED ORDER — CELECOXIB 200 MG PO CAPS: 200.0000 mg | ORAL_CAPSULE | Freq: Two times a day (BID) | ORAL | 0 refills | Status: DC

## 2020-01-30 MED ORDER — OXYCODONE HCL 5 MG PO TABS: 5.0000 mg | ORAL_TABLET | ORAL | 0 refills | Status: DC | PRN

## 2020-01-30 MED ORDER — ENOXAPARIN SODIUM 40 MG/0.4ML ~~LOC~~ SOLN: 40.0000 mg | SUBCUTANEOUS | 0 refills | Status: DC

## 2020-01-30 NOTE — Plan of Care (Signed)
Pressure dressing applied to incision prior to discharge. -Cassell Smiles, PA-C

## 2020-01-30 NOTE — Progress Notes (Signed)
Offered to assist pt with bath.  Pt says he had a bath the other morning and he feels okay for now.  I told him to let us know when he is ready and we will assist him with the bath.

## 2020-01-30 NOTE — Progress Notes (Addendum)
Occupational Therapy Treatment Patient Details Name: Daniel Kline MRN: [id number] DOB: 1946/02/21 Today's Date: 01/30/2020    History of present illness Pt is a 74 y/o man POD 1 s/p R TKA. PMH includes anemia, CKD stage III, cutaneous T-cell lymphoma, DM II, essential HTN, benign, obstructive sleep apnea, OA, GERD, polio (R arm affected), and anxiety.   OT comments  Pt seen for OT tx this date to f/u re: polar and compression stocking mgt education as well as ed re: safety with ADL transfers. Pt noted to have moderate amount of sero-sanguinous drainage saturating bandage and chuck beneath him so transfer trials limited at this time. OT facilitates education re: polar care and compression stocking mgt and pt with good reception. One transfer completed (purely to change chuck from under him) with RW with R platform with CGA. Pt demonstrating improved tolerance versus yesterday. OT also engages pt in UB dsg with setup and Modified technique as well as LB dressing in which he requires MOD A to use AE for R LE. Overall, pt improving and remains appropriate to d/c home with [facility] therapy from OT standpoint.    Follow Up Recommendations  Home health OT;Supervision - Intermittent    Equipment Recommendations  3 in 1 bedside commode    Recommendations for Other Services      Precautions / Restrictions Precautions Precautions: Fall Restrictions Weight Bearing Restrictions: No RLE Weight Bearing: Weight bearing as tolerated       Mobility Bed Mobility               General bed mobility comments: sitting EOB eating from breakfast tray when OT presents. Sitting EOB with bed alarm set at end of session with RN presenting to reinforce dressing.  Transfers Overall transfer level: Needs assistance Equipment used: Rolling walker (2 wheeled) Transfers: Sit to/from Stand Sit to Stand: Min guard              Balance Overall balance assessment: Needs assistance Sitting-balance support:  No upper extremity supported;Feet supported Sitting balance-Leahy Scale: Good     Standing balance support: Bilateral upper extremity supported;During functional activity Standing balance-Leahy Scale: Fair Standing balance comment: B UE support, but less physical support/external support req'ed to safely maintain static stand this date.                           ADL either performed or assessed with clinical judgement   ADL Overall ADL's : Needs assistance/impaired                 Upper Body Dressing : Set up;Sitting Upper Body Dressing Details (indicate cue type and reason): pt uses modified technique to don clean gown to his front side, OT provides setup by snapping buttons and handing gown to patient correctly oriented. Lower Body Dressing: Moderate assistance;Sit to/from stand Lower Body Dressing Details (indicate cue type and reason): MOD A to don sock to R LE in sitting. Pt instructed in sock aide use for which he is somewhat familiar as he uses these for compression socks at home.   Toilet Transfer Details (indicate cue type and reason): Pt does report that he feels he needs to use the restroom at some point, but is noted to have a moderate amount of sero-sanguinous drainage on the chuck pad where he was sitting. RN notified. One partial stand with RW w/ R platform was completed to change pads with MIN A, but no further mobilization completed at  this time.                 Vision Patient Visual Report: No change from baseline     Perception     Praxis      Cognition Arousal/Alertness: Awake/alert Behavior During Therapy: WFL for tasks assessed/performed Overall Cognitive Status: Within Functional Limits for tasks assessed                                          Exercises Other Exercises Other Exercises: OT re-addresses ed: re polar care and compression stocking mgt. Pt with good reception.   Shoulder Instructions       General  Comments      Pertinent Vitals/ Pain       Pain Assessment: No/denies pain  Home Living                                          Prior Functioning/Environment              Frequency  Min 2X/week        Progress Toward Goals  OT Goals(current goals can now be found in the care plan section)  Progress towards OT goals: Progressing toward goals  Acute Rehab OT Goals Patient Stated Goal: to go home OT Goal Formulation: With patient Time For Goal Achievement: 02/12/20 Potential to Achieve Goals: Good  Plan      Co-evaluation                 AM-PAC OT "6 Clicks" Daily Activity     Outcome Measure   Help from another person eating meals?: None Help from another person taking care of personal grooming?: A Little Help from another person toileting, which includes using toliet, bedpan, or urinal?: A Lot Help from another person bathing (including washing, rinsing, drying)?: A Lot Help from another person to put on and taking off regular upper body clothing?: A Little Help from another person to put on and taking off regular lower body clothing?: A Lot 6 Click Score: 16    End of Session Equipment Utilized During Treatment: Gait belt;Rolling walker;Other (comment)  OT Visit Diagnosis: Unsteadiness on feet (R26.81);Other abnormalities of gait and mobility (R26.89);Muscle weakness (generalized) (M62.81)   Activity Tolerance Patient tolerated treatment well;Other (comment) (treatment limited d/t ser-sanguinous draininage from bandaged area)   Patient Left with call bell/phone within reach;in bed;with bed alarm set   Nurse Communication Other (comment) (notified RN that pt's dressing area with moderate saturation of sero-sanguinous drainiage. RN offers to reinforce dressing and presents at end of session)        Time: [phone] OT Time Calculation (min): 42 min  Charges: OT General Charges $OT Visit: 1 Visit OT Treatments $Self Care/Home  Management : 23-37 mins $Therapeutic Activity: 8-22 mins  Gerrianne Scale, [place], OTR/L ascom [phone] 01/30/20, 11:02 AM

## 2020-01-30 NOTE — Progress Notes (Signed)
Inpatient Diabetes Program Recommendations  AACE/ADA: New Consensus Statement on Inpatient Glycemic Control (2015)  Target Ranges:  Prepandial:   less than 140 mg/dL      Peak postprandial:   less than 180 mg/dL (1-2 hours)      Critically ill patients:  140 - 180 mg/dL   Lab Results  Component Value Date   GLUCAP 157 (H) 01/30/2020   HGBA1C 7.8 (H) 01/21/2020    Review of Glycemic Control Results for Daniel Kline, Daniel Kline (MRN [id number]) as of 01/30/2020 15:11  Ref. Range 01/29/2020 08:16 01/29/2020 11:48 01/29/2020 16:46 01/29/2020 21:20 01/30/2020 07:43  Glucose-Capillary Latest Ref Range: 70 - 99 mg/dL 240 (H) 201 (H) 226 (H) 222 (H) 157 (H)   Diabetes history: DM2   Outpatient Diabetes medications:  Victoza  Metformin glucotrol  Current orders for Inpatient glycemic control:  Novolog 0-15 tid Metformin 500 bid Decadron 8 mg x1  Note:  Received page from [place], [place] that patient would like to speak to DM coordinator about his DM and how to better manage. Spoke with patient on the phone.  He has had DM2 for around 5 years.  His last A1C was 6.4%.  He sees Dr. Silvio Pate every 3 mos.  Reviewed patient's current A1c of 7.8 % (average blood sugar of 177 mg/dl) . Explained what a A1c is and what it measures. Also reviewed goal A1c with patient, importance of good glucose control @ home, and blood sugar goals.  He drinks a lot of milk and was not aware that milk contains CHO's.  Explained CHO's, which foods contain CHO's and goal amount of 60-70 grams per meal for a male.  He does not drink anything with regular sugar.   Discussed The Plate Method.  He does like to lift weights.  Encouraged patient to exercise 15-20 daily once medically cleared by MD.  Explained that exercise can reduce his blood sugar.  He checks his CBG's twice daily.  He states his meter usually reads 140-160mg /dl.  He recognizes that exercise does bring down his sugar.  He takes the above medications as prescribed.  He recently  started Victoza 3 mos ago.  Explained that his blood sugars have been elevated while in hospital because of steroid administration and that his sugar should come down as steroid wears off.   Encouraged patient to keep following up with his PCP and taking medications as prescribed.  All questions answered.  Will continue to follow while inpatient.  Thank you, [name], RN, BSN Diabetes Coordinator Inpatient Diabetes Program [phone] (team pager from 8a-5p)

## 2020-01-30 NOTE — Progress Notes (Signed)
°  Subjective: 2 Days Post-Op Procedure(s) (LRB): COMPUTER ASSISTED TOTAL KNEE ARTHROPLASTY (Right) Patient reports pain as well-controlled.   Patient is well, and has had no acute complaints or problems Plan is to go Home after hospital stay. Negative for chest pain and shortness of breath Fever: no Gastrointestinal: negative for nausea and vomiting.   Patient has not had a bowel movement.  Objective: Vital signs in last 24 hours: Temp:  [97.5 F (36.4 C)-98.6 F (37 C)] 98.6 F (37 C) (06/23 0743) Pulse Rate:  [51-63] 53 (06/23 0743) Resp:  [15-16] 16 (06/23 0743) BP: (130-176)/(74-92) 160/85 (06/23 0743) SpO2:  [97 %-100 %] 97 % (06/23 0743)  Intake/Output from previous day:  Intake/Output Summary (Last 24 hours) at 01/30/2020 1108 Last data filed at 01/30/2020 1028 Gross per 24 hour  Intake 480 ml  Output 1460 ml  Net -980 ml    Intake/Output this shift: Total I/O In: 240 [P.O.:240] Out: 350 [Urine:350]  Labs: No results for input(s): HGB in the last 72 hours. No results for input(s): WBC, RBC, HCT, PLT in the last 72 hours. No results for input(s): NA, K, CL, CO2, BUN, CREATININE, GLUCOSE, CALCIUM in the last 72 hours. No results for input(s): LABPT, INR in the last 72 hours.   EXAM General - Patient is Alert, Appropriate and Oriented Extremity - Neurovascular intact Dorsiflexion/Plantar flexion intact Compartment soft Dressing/Incision -Postoperative dressing remains in place., Polar Care in place and working. , Hemovac in place. Following removal of post-op dressing, honeycomb dressing was saturated with blood. Drain appeared clogged. Motor Function - intact, moving foot and toes well on exam.  Cardiovascular- Regular rate and rhythm, no murmurs/rubs/gallops Respiratory- Mild expiratory wheezes heard throughout, otherwise bilaterally Gastrointestinal- soft, nontender and distended   Assessment/Plan: 2 Days Post-Op Procedure(s) (LRB): COMPUTER ASSISTED  TOTAL KNEE ARTHROPLASTY (Right) Active Problems:   Total knee replacement status  Estimated body mass index is 37.45 kg/m as calculated from the following:   Height as of this encounter: 5\' 6"  (1.676 m).   Weight as of this encounter: 105.2 kg. Advance diet Up with therapy Discharge home with home health  Hemovac removed. Post-op dressing removed. Fresh honeycomb dressing applied. Dressing reinforced with ABDs and wrapped in ACE wrap.  DVT Prophylaxis - Lovenox, Ted hose and foot pumps Weight-Bearing as tolerated to right leg  Cassell Smiles, PA-C [facility] 01/30/2020, 11:08 AM

## 2020-01-30 NOTE — Anesthesia Postprocedure Evaluation (Signed)
Anesthesia Post Note  Patient: Daniel Kline  Procedure(s) Performed: COMPUTER ASSISTED TOTAL KNEE ARTHROPLASTY (Right Knee)  Patient location during evaluation: Nursing Unit Anesthesia Type: General Level of consciousness: oriented and awake and alert Pain management: pain level controlled Vital Signs Assessment: post-procedure vital signs reviewed and stable Respiratory status: spontaneous breathing Cardiovascular status: blood pressure returned to baseline and stable Postop Assessment: no headache, no backache, no apparent nausea or vomiting and patient able to bend at knees Anesthetic complications: no   No complications documented.   Last Vitals:  Vitals:   01/30/20 0425 01/30/20 0743  BP: (!) 145/92 (!) 160/85  Pulse: (!) 51 (!) 53  Resp:  16  Temp:  37 C  SpO2:  97%    Last Pain:  Vitals:   01/30/20 0743  TempSrc: Oral  PainSc:                  Precious Haws [name]

## 2020-01-30 NOTE — Progress Notes (Signed)
Physical Therapy Treatment Patient Details Name: Daniel Kline MRN: [id number] DOB: 1946/02/08 Today's Date: 01/30/2020    History of Present Illness Pt is a 74 y/o man POD 2 s/p R TKA. PMH includes anemia, CKD stage III, cutaneous T-cell lymphoma, DM II, essential HTN, benign, obstructive sleep apnea, OA, GERD, polio (R arm affected), and anxiety.    PT Comments    Pt seated edge of bed with RN re-dressing R knee due to bleeding. Pt very pleasant and agreeable for PT session today. Pt increased ambulation distance today to 300 feet utilizing RW with RUE platform and CGA for safety. Pt ambulates with reciprocal pattern with decreased speed however with good fluidity of gait and AD usage. Pt negotiated 4 steps using BUE on R handrail with min A for steadying and safety. Increased education on allowing room on step for both feet to be placed with good carryover of verbal cues. Pt performs sit <> stand transfers from bed and recliner chair with CGA. Pt continues to make good progress this acute stay. Pt cleared by PT standpoint.    Follow Up Recommendations  Home health PT     Equipment Recommendations  3in1 (PT);Other (comment)    Recommendations for Other Services       Precautions / Restrictions Precautions Precautions: Fall Restrictions Weight Bearing Restrictions: No RLE Weight Bearing: Weight bearing as tolerated    Mobility  Bed Mobility               General bed mobility comments: pt seated edge of bed upon arrival with RN present; not assessed  Transfers Overall transfer level: Needs assistance Equipment used: Right platform walker Transfers: Sit to/from Stand Sit to Stand: Min guard         General transfer comment: CGA for steadying  Ambulation/Gait Ambulation/Gait assistance: Min guard Gait Distance (Feet): 300 Feet Assistive device: Rolling walker (2 wheeled) (with RUE platform) Gait Pattern/deviations: Step-through pattern Gait velocity:  decreased   General Gait Details: reciprocal gait pattern with good heel toe progression in stance phase   Stairs Stairs: Yes Stairs assistance: Min assist Stair Management: One rail Right;Sideways;Step to pattern Number of Stairs: 4 General stair comments: pt provided demo and then performed with min A for safety and steadying   Wheelchair Mobility    Modified Rankin (Stroke Patients Only)       Balance Overall balance assessment: Needs assistance Sitting-balance support: No upper extremity supported;Feet supported Sitting balance-Leahy Scale: Good     Standing balance support: Bilateral upper extremity supported;During functional activity Standing balance-Leahy Scale: Fair Standing balance comment: B UE support, but less physical support/external support req'ed to safely maintain static stand this date.                            Cognition Arousal/Alertness: Awake/alert Behavior During Therapy: WFL for tasks assessed/performed Overall Cognitive Status: Within Functional Limits for tasks assessed                                        Exercises Total Joint Exercises Hip ABduction/ADduction: AROM;Right;15 reps;Seated Straight Leg Raises: AROM;Right;15 reps;Seated Long Arc Quad: AROM;Right;15 reps;Seated Goniometric ROM: R knee: 0-100 degrees Other Exercises Other Exercises: OT re-addresses ed: re polar care and compression stocking mgt. Pt with good reception.    General Comments        Pertinent Vitals/Pain  Pain Assessment: 0-10 Pain Score: 6  (initially 1/10 and increased to 6/10) Pain Location: R knee Pain Descriptors / Indicators: Sore;Discomfort Pain Intervention(s): Monitored during session;Repositioned;Ice applied;RN gave pain meds during session    Home Living                      Prior Function            PT Goals (current goals can now be found in the care plan section) Acute Rehab PT Goals Patient Stated  Goal: to go home PT Goal Formulation: With patient Time For Goal Achievement: 02/12/20 Potential to Achieve Goals: Good Progress towards PT goals: Progressing toward goals    Frequency    BID      PT Plan Current plan remains appropriate    Co-evaluation              AM-PAC PT "6 Clicks" Mobility   Outcome Measure  Help needed turning from your back to your side while in a flat bed without using bedrails?: A Little Help needed moving from lying on your back to sitting on the side of a flat bed without using bedrails?: A Little Help needed moving to and from a bed to a chair (including a wheelchair)?: A Little Help needed standing up from a chair using your arms (e.g., wheelchair or bedside chair)?: A Little Help needed to walk in hospital room?: A Little Help needed climbing 3-5 steps with a railing? : A Little 6 Click Score: 18    End of Session Equipment Utilized During Treatment: Gait belt Activity Tolerance: Patient tolerated treatment well Patient left: in chair;with call bell/phone within reach;with chair alarm set Nurse Communication: Mobility status PT Visit Diagnosis: Unsteadiness on feet (R26.81);Muscle weakness (generalized) (M62.81);Pain Pain - Right/Left: Right Pain - part of body: Knee     Time: 0211-1735 PT Time Calculation (min) (ACUTE ONLY): 45 min  Charges:  $Gait Training: 23-37 mins $Therapeutic Exercise: 8-22 mins                     Vale Haven, SPT   Deronda Christian 01/30/2020, 1:21 PM

## 2020-01-30 NOTE — TOC Benefit Eligibility Note (Signed)
Transition of Care [facility]) Benefit Eligibility Note    Patient Details  Name: Daniel Kline MRN: [id number] Date of Birth: [birth year]   Medication/Dose: Enoxaparin 40 mg daily x 14 days  Covered?: Yes (Generic - Enoxaparin)  Tier: Other (Tier 4)  Prescription Coverage Preferred Pharmacy: Any in-network  Spoke with Person/Company/Phone Number:: [facility] [employer], [phone]  Co-Pay: $25% of the cost of medication  Prior Approval: Yes [phone])  Deductible: Unmet (On coverage gap)  Additional Notes: Lovenox would require Prior Approval by MD, Heidi was unable to run a test claim since pt in donut hole.    [place] Phone Number: 01/30/2020, 8:48 AM

## 2020-01-31 ENCOUNTER — Other Ambulatory Visit: Payer: Self-pay

## 2020-01-31 ENCOUNTER — Emergency Department
Admission: EM | Admit: 2020-01-31 | Discharge: 2020-01-31 | Disposition: A | Discharge status: Home / Self Care | Payer: Medicare Other | Source: Home / Self Care | Attending: Student | Admitting: Student

## 2020-01-31 ENCOUNTER — Encounter: Payer: Self-pay | Admitting: Emergency Medicine

## 2020-01-31 DIAGNOSIS — Z87891 Personal history of nicotine dependence: Secondary | ICD-10-CM | POA: Diagnosis not present

## 2020-01-31 DIAGNOSIS — M96840 Postprocedural hematoma of a musculoskeletal structure following a musculoskeletal system procedure: Secondary | ICD-10-CM | POA: Diagnosis not present

## 2020-01-31 DIAGNOSIS — Z96651 Presence of right artificial knee joint: Secondary | ICD-10-CM | POA: Diagnosis not present

## 2020-01-31 DIAGNOSIS — Z03818 Encounter for observation for suspected exposure to other biological agents ruled out: Secondary | ICD-10-CM | POA: Diagnosis not present

## 2020-01-31 DIAGNOSIS — E785 Hyperlipidemia, unspecified: Secondary | ICD-10-CM | POA: Diagnosis not present

## 2020-01-31 DIAGNOSIS — Z8614 Personal history of Methicillin resistant Staphylococcus aureus infection: Secondary | ICD-10-CM | POA: Diagnosis not present

## 2020-01-31 DIAGNOSIS — E119 Type 2 diabetes mellitus without complications: Secondary | ICD-10-CM | POA: Insufficient documentation

## 2020-01-31 DIAGNOSIS — Z96643 Presence of artificial hip joint, bilateral: Secondary | ICD-10-CM | POA: Insufficient documentation

## 2020-01-31 DIAGNOSIS — T8483XA Hemorrhage due to internal orthopedic prosthetic devices, implants and grafts, initial encounter: Secondary | ICD-10-CM | POA: Diagnosis not present

## 2020-01-31 DIAGNOSIS — R404 Transient alteration of awareness: Secondary | ICD-10-CM | POA: Diagnosis not present

## 2020-01-31 DIAGNOSIS — Z885 Allergy status to narcotic agent status: Secondary | ICD-10-CM | POA: Diagnosis not present

## 2020-01-31 DIAGNOSIS — Z743 Need for continuous supervision: Secondary | ICD-10-CM | POA: Diagnosis not present

## 2020-01-31 DIAGNOSIS — G4733 Obstructive sleep apnea (adult) (pediatric): Secondary | ICD-10-CM | POA: Diagnosis not present

## 2020-01-31 DIAGNOSIS — R42 Dizziness and giddiness: Secondary | ICD-10-CM | POA: Insufficient documentation

## 2020-01-31 DIAGNOSIS — N183 Chronic kidney disease, stage 3 unspecified: Secondary | ICD-10-CM | POA: Insufficient documentation

## 2020-01-31 DIAGNOSIS — Z4801 Encounter for change or removal of surgical wound dressing: Secondary | ICD-10-CM | POA: Diagnosis not present

## 2020-01-31 DIAGNOSIS — R6889 Other general symptoms and signs: Secondary | ICD-10-CM | POA: Diagnosis not present

## 2020-01-31 DIAGNOSIS — R52 Pain, unspecified: Secondary | ICD-10-CM | POA: Diagnosis not present

## 2020-01-31 DIAGNOSIS — D62 Acute posthemorrhagic anemia: Secondary | ICD-10-CM | POA: Diagnosis not present

## 2020-01-31 DIAGNOSIS — I129 Hypertensive chronic kidney disease with stage 1 through stage 4 chronic kidney disease, or unspecified chronic kidney disease: Secondary | ICD-10-CM | POA: Insufficient documentation

## 2020-01-31 DIAGNOSIS — Z7984 Long term (current) use of oral hypoglycemic drugs: Secondary | ICD-10-CM | POA: Insufficient documentation

## 2020-01-31 DIAGNOSIS — R58 Hemorrhage, not elsewhere classified: Secondary | ICD-10-CM | POA: Diagnosis not present

## 2020-01-31 DIAGNOSIS — I4892 Unspecified atrial flutter: Secondary | ICD-10-CM | POA: Diagnosis not present

## 2020-01-31 DIAGNOSIS — Z5189 Encounter for other specified aftercare: Secondary | ICD-10-CM

## 2020-01-31 DIAGNOSIS — M96830 Postprocedural hemorrhage and hematoma of a musculoskeletal structure following a musculoskeletal system procedure: Secondary | ICD-10-CM | POA: Insufficient documentation

## 2020-01-31 DIAGNOSIS — E1122 Type 2 diabetes mellitus with diabetic chronic kidney disease: Secondary | ICD-10-CM | POA: Diagnosis not present

## 2020-01-31 DIAGNOSIS — Z791 Long term (current) use of non-steroidal anti-inflammatories (NSAID): Secondary | ICD-10-CM | POA: Diagnosis not present

## 2020-01-31 DIAGNOSIS — Z20822 Contact with and (suspected) exposure to covid-19: Secondary | ICD-10-CM | POA: Diagnosis not present

## 2020-01-31 DIAGNOSIS — Z85828 Personal history of other malignant neoplasm of skin: Secondary | ICD-10-CM | POA: Insufficient documentation

## 2020-01-31 DIAGNOSIS — E1165 Type 2 diabetes mellitus with hyperglycemia: Secondary | ICD-10-CM | POA: Diagnosis not present

## 2020-01-31 DIAGNOSIS — Z79899 Other long term (current) drug therapy: Secondary | ICD-10-CM | POA: Insufficient documentation

## 2020-01-31 LAB — BASIC METABOLIC PANEL
Anion gap: 8 (ref 5–15)
BUN: 28 mg/dL — ABNORMAL HIGH (ref 8–23)
CO2: 27 mmol/L (ref 22–32)
Calcium: 8.7 mg/dL — ABNORMAL LOW (ref 8.9–10.3)
Chloride: 101 mmol/L (ref 98–111)
Creatinine, Ser: 1.63 mg/dL — ABNORMAL HIGH (ref 0.61–1.24)
GFR calc Af Amer: 47 mL/min — ABNORMAL LOW (ref >60)
GFR calc non Af Amer: 41 mL/min — ABNORMAL LOW (ref >60)
Glucose, Bld: 234 mg/dL — ABNORMAL HIGH (ref 70–99)
Potassium: 4.2 mmol/L (ref 3.5–5.1)
Sodium: 136 mmol/L (ref 135–145)

## 2020-01-31 LAB — CBC WITH DIFFERENTIAL/PLATELET
Abs Immature Granulocytes: 0.04 10*3/uL (ref 0.00–0.07)
Basophils Absolute: 0.1 10*3/uL (ref 0.0–0.1)
Basophils Relative: 1 %
Eosinophils Absolute: 0.1 10*3/uL (ref 0.0–0.5)
Eosinophils Relative: 1 %
HCT: 30.3 % — ABNORMAL LOW (ref 39.0–52.0)
Hemoglobin: 10.3 g/dL — ABNORMAL LOW (ref 13.0–17.0)
Immature Granulocytes: 1 %
Lymphocytes Relative: 17 %
Lymphs Abs: 1.4 10*3/uL (ref 0.7–4.0)
MCH: 28.3 pg (ref 26.0–34.0)
MCHC: 34 g/dL (ref 30.0–36.0)
MCV: 83.2 fL (ref 80.0–100.0)
Monocytes Absolute: 0.8 10*3/uL (ref 0.1–1.0)
Monocytes Relative: 10 %
Neutro Abs: 5.6 10*3/uL (ref 1.7–7.7)
Neutrophils Relative %: 70 %
Platelets: 183 10*3/uL (ref 150–400)
RBC: 3.64 MIL/uL — ABNORMAL LOW (ref 4.22–5.81)
RDW: 14.6 % (ref 11.5–15.5)
WBC: 8 10*3/uL (ref 4.0–10.5)
nRBC: 0 % (ref 0.0–0.2)

## 2020-01-31 LAB — GLUCOSE, CAPILLARY: Glucose-Capillary: 261 mg/dL — ABNORMAL HIGH (ref 70–99)

## 2020-01-31 LAB — TYPE AND SCREEN
ABO/RH(D): O POS
Antibody Screen: NEGATIVE

## 2020-01-31 LAB — PROTIME-INR
INR: 1 (ref 0.8–1.2)
Prothrombin Time: 12.7 seconds (ref 11.4–15.2)

## 2020-01-31 MED ORDER — OXYCODONE HCL 5 MG PO TABS
10.0000 mg | ORAL_TABLET | Freq: Once | ORAL | Status: AC
  Administered 2020-01-31: 10 mg via ORAL
  Filled 2020-01-31: qty 2

## 2020-01-31 MED ORDER — ONDANSETRON HCL 4 MG/2ML IJ SOLN
4.0000 mg | Freq: Once | INTRAMUSCULAR | Status: AC
  Administered 2020-01-31: 4 mg via INTRAVENOUS
  Filled 2020-01-31: qty 2

## 2020-01-31 MED ORDER — FENTANYL CITRATE (PF) 100 MCG/2ML IJ SOLN
50.0000 ug | Freq: Once | INTRAMUSCULAR | Status: AC
  Administered 2020-01-31: 50 ug via INTRAVENOUS
  Filled 2020-01-31: qty 2

## 2020-01-31 NOTE — ED Notes (Addendum)
Pt presents to the ED for wound check. Pt had a R knee replacement on Monday and was d/c from the hospital yesterday. Pt states that the bleeding started yesterday while he was still in the hospital and they repacked it in the hospital. Pt states that the dressing was filled with blood and when he sat on the EMS stretcher the bandage busted open. On arrival, pt is A&Ox4 and NAD. Pt c/o 10/10 pain in his right knee. Significant amount of blood noted around and inside the dressing.

## 2020-01-31 NOTE — ED Triage Notes (Addendum)
Pt via EMS from home. Pt states that he has knee replacement on his R knee on Monday. Pt was d/c from the hospital yesterday and pt started to notice the bleeding on yesterday. When pt sat on the EMS stretcher the bandage busted. On arrival, pt is A&Ox4 and NAD. Pt c/o R knee pain 10/10. Pt is on Lovenox.

## 2020-01-31 NOTE — ED Provider Notes (Signed)
[id number] [facility] Emergency Department Provider Note  ____________________________________________   None    (approximate)  I have reviewed the triage vital signs and the nursing notes.  History  Chief Complaint Wound Check    HPI Daniel Kline is a 74 y.o. male s/p recent R total knee arthroplasty on 01/28/2020 (just discharged yesterday) who presents to the ER for bleeding from his incision site.  Patient states he had some mild bleeding yesterday prior to discharge, but over the last 24 hours this has significantly increased, and now he is having bleeding that is starting to uplift his incision site dressing and ooze from the sides.  He does report some mild lightheadedness, but no chest pain, shortness of breath, syncope.  He is currently on postprocedure Lovenox.  Denies any weakness, numbness, tingling. Has been mobile since discharge, able to walk around last night.    Past Medical Hx Past Medical History:  Diagnosis Date  . Anemia    H/O  . Anxiety   . Arthritis   . Chronic kidney disease   . Complication of anesthesia   . 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 Diabetes mellitus without complication ([place])   . Dysrhythmia    A FLUTTER  . Family history of adverse reaction to anesthesia    PT WAS ADOPTED  . HLD (hyperlipidemia)   . HTN (hypertension)   . HTN (hypertension)   . Hx of dysplastic nevus 2019   multiple sites  . 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 MRSA (methicillin resistant Staphylococcus aureus)    after back surgery  . OSA (obstructive sleep apnea)    USES BIPAP  . Polio   . 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 PONV (postoperative nausea and vomiting)     Problem List Patient Active Problem List   Diagnosis Date Noted  . Total knee replacement status 01/28/2020  . Encounter for long-term (current) use of high-risk medication 06/14/2019  . Seronegative rheumatoid  arthritis ([place]) 06/14/2019  . Chronic narcotic dependence ([place]) 05/18/2019  . Anti-cyclic citrullinated peptide antibody positive 05/03/2019  . Synovitis of hand 05/03/2019  . Radial neuropathy, left 09/06/2018  . Type 2 diabetes mellitus, uncontrolled, with renal complications ([place]) 21/30/8657  . Chronic bilateral low back pain without sciatica 06/09/2018  . Knee pain, chronic 04/12/2018  . OSA (obstructive sleep apnea) 09/07/2017  . History of poliomyelitis 08/27/2017  . Localized edema 08/12/2017  . Actinic keratosis 07/26/2017  . Cutaneous horn 07/26/2017  . History of total hip arthroplasty, bilateral 06/09/2016  . GERD (gastroesophageal reflux disease) 04/21/2016  . PONV (postoperative nausea and vomiting) 04/21/2016  . Skin cancer 04/21/2016  . Asymptomatic gallstones 01/06/2016  . Atrial flutter ([place]) 10/29/2015  . Venous stasis dermatitis of both lower extremities 10/29/2015  . Chronic kidney disease (CKD), stage III (moderate) ([place]) 09/27/2015  . Morbid obesity ([place]) 09/27/2015  . Primary osteoarthritis of right knee 08/26/2015  . Cutaneous T-cell lymphoma ([place]) 12/20/2011  . Hypercholesterolemia 12/20/2011  . Infection and inflammatory reaction due to internal prosthetic device, implant, and graft 12/20/2011  . Essential hypertension 01/12/2010    Past Surgical Hx Past Surgical History:  Procedure Laterality Date  . BACK SURGERY     LUMBAR  . CARDIAC ELECTROPHYSIOLOGY STUDY AND ABLATION  2019  . COLONOSCOPY WITH PROPOFOL N/A 04/04/2019   Procedure: COLONOSCOPY WITH PROPOFOL;  Surgeon: Toledo, Benay Pike, MD;  Location: [facility] ENDOSCOPY;  Service:  Gastroenterology;  Laterality: N/A;  . INCISION AND DRAINAGE     BACK-MRSA INFECTION AFTER BACK SURGERY  . KNEE ARTHROPLASTY Right 01/28/2020   Procedure: COMPUTER ASSISTED TOTAL KNEE ARTHROPLASTY;  Surgeon: Dereck Leep, MD;  Location: [facility] ORS;  Service: Orthopedics;  Laterality: Right;  . MOUTH SURGERY    . right elbow  surgery    . right knee surgery    . right shoulder surgery     from polio damage  . TONSILLECTOMY    . TOTAL HIP ARTHROPLASTY Bilateral 04/2016    Medications Prior to Admission medications   Medication Sig Start Date End Date Taking? Authorizing Provider  amLODipine (NORVASC) 10 MG tablet TAKE 1 TABLET BY MOUTH  DAILY Patient taking differently: Take 10 mg by mouth daily.  09/03/19   [name], MD  carvedilol (COREG) 12.5 MG tablet Take 1 tablet (12.5 mg total) by mouth 2 (two) times daily. 01/23/20 04/22/20  Verta Ellen., NP  celecoxib (CELEBREX) 200 MG capsule Take 1 capsule (200 mg total) by mouth 2 (two) times daily. 01/30/20   Fausto Skillern, PA-C  Cholecalciferol (VITAMIN D-3 PO) Take 10,000 Units by mouth daily.     [provider]  clobetasol cream (TEMOVATE) 4.40 % Apply 1 application topically 2 (two) times daily as needed (irritation).  06/27/15   [provider]  clorazepate (TRANXENE) 7.5 MG tablet TAKE 1 TABLET (7.5 MG TOTAL) BY MOUTH 2 (TWO) TIMES DAILY AS NEEDED FOR ANXIETY. 01/21/20   [name], MD  Elastic Bandages & Supports (MEDICAL COMPRESSION STOCKINGS) MISC 2 Units by Does not apply route daily. 08/19/17   Merlyn Lot, MD  enoxaparin (LOVENOX) 40 MG/0.4ML injection Inject 0.4 mLs (40 mg total) into the skin daily for 14 days. 01/30/20 02/13/20  Tamala Julian B, PA-C  EPINEPHrine 0.3 mg/0.3 mL IJ SOAJ injection Inject 0.3 mLs (0.3 mg total) into the muscle as needed for anaphylaxis. 11/22/19   [name], MD  Ferrous Sulfate (IRON PO) Take 1 tablet by mouth at bedtime.    [provider]  furosemide (LASIX) 40 MG tablet Take 40 mg by mouth 2 (two) times daily.    [provider]  gabapentin (NEURONTIN) 300 MG capsule TAKE 1 CAPSULE BY MOUTH 3  TIMES DAILY Patient taking differently: Take 300 mg by mouth 3 (three) times daily.  09/11/19   [name], MD  glipiZIDE (GLUCOTROL) 5 MG tablet TAKE  ONE-HALF TABLET BY  MOUTH 2 TIMES DAILY BEFORE  A MEAL. Patient taking differently: Take 2.5 mg by mouth 2 (two) times daily before a meal.  11/26/19   [name], MD  hydrOXYzine (ATARAX/VISTARIL) 25 MG tablet TAKE ONE TABLET EVERY EIGHT HOURS AS NEEDED Patient taking differently: Take 25 mg by mouth every 8 (eight) hours as needed for anxiety.  12/31/19   [name], MD  isosorbide mononitrate (IMDUR) 60 MG 24 hr tablet Take 60 mg by mouth daily before lunch.  08/22/18   [provider]  liraglutide (VICTOZA) 18 MG/3ML SOPN Inject 0.2 mLs (1.2 mg total) into the skin daily. 08/23/19   [name], MD  losartan (COZAAR) 100 MG tablet Take 1 tablet (100 mg total) by mouth daily. Patient taking differently: Take 100 mg by mouth daily before lunch.  02/05/19   Minna Merritts, MD  metFORMIN (GLUCOPHAGE) 500 MG tablet TAKE 1 TABLET BY MOUTH TWO  TIMES DAILY Patient taking differently: Take 500 mg by mouth  2 (two) times daily with a meal.  09/11/19   [name], MD  Multiple Vitamins-Minerals (MULTIVITAL) tablet Take 1 tablet by mouth daily.      [provider]  ONETOUCH ULTRA test strip CHECK BLOOD SUGAR 3 TIMES DAILY AS DIRECTED 01/04/20   Crecencio Mc, MD  oxyCODONE (OXY IR/ROXICODONE) 5 MG immediate release tablet Take 1 tablet (5 mg total) by mouth every 4 (four) hours as needed for moderate pain (pain score 4-6). 01/30/20   Tamala Julian B, PA-C  rosuvastatin (CRESTOR) 20 MG tablet TAKE ONE TABLET EVERY DAY Patient taking differently: Take 20 mg by mouth daily.  09/06/19   [name], MD  timolol (BETIMOL) 0.25 % ophthalmic solution Place 1-2 drops into both eyes 2 (two) times daily.    [provider]  timolol (TIMOPTIC) 0.5 % ophthalmic solution Apply 1 drop to eye 2 (two) times daily. 01/11/20   [provider]  traMADol (ULTRAM) 50 MG tablet Take 1 tablet (50 mg total) by mouth every 4 (four) hours as needed for moderate pain.  01/30/20   Fausto Skillern, PA-C    Allergies Bee venom, Oxycodone, Hydromorphone, and Zolpidem  Family Hx Family History  Adopted: Yes    Social Hx Social History   Tobacco Use  . Smoking status: Former Smoker    Types: Cigars    Quit date: 09/23/1990    Years since quitting: 29.3  . Smokeless tobacco: Former Systems developer    Quit date: 02/25/2006  Vaping Use  . Vaping Use: Never used  Substance Use Topics  . Alcohol use: No    Alcohol/week: 0.0 standard drinks  . Drug use: No     Review of Systems  Constitutional: Negative for fever. Negative for chills. Eyes: Negative for visual changes. ENT: Negative for sore throat. Cardiovascular: Negative for chest pain. Respiratory: Negative for shortness of breath. Gastrointestinal: Negative for nausea. Negative for vomiting.  Genitourinary: Negative for dysuria. Musculoskeletal: Negative for leg swelling. Skin: + surgical site bleeding Neurological: Negative for headaches.   Physical Exam  Vital Signs: ED Triage Vitals  Enc Vitals Group     BP 01/31/20 1112 (!) 179/82     Pulse Rate 01/31/20 1112 82     Resp 01/31/20 1112 12     Temp 01/31/20 1112 98.6 F (37 C)     Temp Source 01/31/20 1112 Oral     SpO2 01/31/20 1112 93 %     Weight 01/31/20 1113 230 lb (104.3 kg)     Height 01/31/20 1113 5\' 6"  (1.676 m)     Head Circumference --      Peak Flow --      Pain Score 01/31/20 1112 10     Pain Loc --      Pain Edu? --      Excl. in [place]? --      Constitutional: Alert and oriented. Well appearing. NAD.  Head: Normocephalic. Atraumatic. Eyes: Conjunctivae clear. Sclera anicteric. Pupils equal and symmetric. Nose: No masses or lesions. No congestion or rhinorrhea. Mouth/Throat: Wearing mask.  Neck: No stridor. Trachea midline.  Cardiovascular: Normal rate, regular rhythm. Extremities well perfused. Respiratory: Normal respiratory effort.    Genitourinary: Deferred. Musculoskeletal: Surgical dressing in place to the  R anterior knee that has moderate/large amount of blood clotted and collected underneath, with some slow oozing from the edge. Distally NV in tact, able to wiggle toes and 2+ DP pulse by palpation.  Neurologic:  Normal speech and  language. No gross focal or lateralizing neurologic deficits are appreciated.  Skin: See MSK above.  Psychiatric: Mood and affect are appropriate for situation.   Procedures  Procedure(s) performed (including critical care):  Procedures   Initial Impression / Assessment and Plan / MDM / ED Course  74 y.o. male who presents to the ED for bleeding from recent TKA incision site. Exam as above. Suspect his bleeding is likely due to combination of increased mobilization as well as his prophylactic Lovenox. Hemoglobin 10.3, slightly decreased from prior, but seemingly appropriate given post op status and his post op bleeding. Does not require transfusion. Discussed case w/ Dr. Marry Guan, who evaluated the patient at bedside in the ER and took down his dressing and replaced it with a wound vac. Patient tolerated well with pain medication. He will follow up in clinic tomorrow. Patient and wife comfortable w/ plan and discharge. Given return precautions.   _______________________________   As part of my medical decision making I have reviewed available labs, radiology tests, reviewed old records/performed chart review,  and discussed with consultants (ortho, Dr. Marry Guan).     Final Clinical Impression(s) / ED Diagnosis  Final diagnoses:  Visit for wound check  Bleeding       Note:  This document was prepared using Dragon voice recognition software and may include unintentional dictation errors.   Lilia Pro., MD 02/01/20 1009

## 2020-01-31 NOTE — Discharge Instructions (Addendum)
Thank you for letting us take care of you in the ER today.  Please follow-up with Dr. Marry Guan in the office as scheduled.  Return to the ER for any new or worsening symptoms.

## 2020-02-01 ENCOUNTER — Ambulatory Visit: Admit: 2020-02-01 | Payer: Medicare Other | Admitting: Orthopedic Surgery

## 2020-02-01 ENCOUNTER — Emergency Department: Payer: Medicare Other | Admitting: Anesthesiology

## 2020-02-01 ENCOUNTER — Encounter
Admission: AD | Disposition: A | Discharge status: Home / Self Care | Payer: Self-pay | Source: Home / Self Care | Attending: Orthopedic Surgery

## 2020-02-01 ENCOUNTER — Inpatient Hospital Stay
Admission: AD | Admit: 2020-02-01 | Discharge: 2020-02-03 | DRG: 908 | Disposition: A | Discharge status: Home Health | Payer: Medicare Other | Attending: Orthopedic Surgery | Admitting: Orthopedic Surgery

## 2020-02-01 ENCOUNTER — Other Ambulatory Visit: Payer: Self-pay

## 2020-02-01 DIAGNOSIS — I4892 Unspecified atrial flutter: Secondary | ICD-10-CM | POA: Diagnosis present

## 2020-02-01 DIAGNOSIS — R404 Transient alteration of awareness: Secondary | ICD-10-CM | POA: Diagnosis not present

## 2020-02-01 DIAGNOSIS — Z79899 Other long term (current) drug therapy: Secondary | ICD-10-CM

## 2020-02-01 DIAGNOSIS — S8001XA Contusion of right knee, initial encounter: Secondary | ICD-10-CM | POA: Diagnosis present

## 2020-02-01 DIAGNOSIS — Z96643 Presence of artificial hip joint, bilateral: Secondary | ICD-10-CM | POA: Diagnosis present

## 2020-02-01 DIAGNOSIS — G4733 Obstructive sleep apnea (adult) (pediatric): Secondary | ICD-10-CM | POA: Diagnosis not present

## 2020-02-01 DIAGNOSIS — Z7984 Long term (current) use of oral hypoglycemic drugs: Secondary | ICD-10-CM

## 2020-02-01 DIAGNOSIS — M96840 Postprocedural hematoma of a musculoskeletal structure following a musculoskeletal system procedure: Principal | ICD-10-CM | POA: Diagnosis present

## 2020-02-01 DIAGNOSIS — Z743 Need for continuous supervision: Secondary | ICD-10-CM | POA: Diagnosis not present

## 2020-02-01 DIAGNOSIS — Z20822 Contact with and (suspected) exposure to covid-19: Secondary | ICD-10-CM | POA: Diagnosis present

## 2020-02-01 DIAGNOSIS — E1122 Type 2 diabetes mellitus with diabetic chronic kidney disease: Secondary | ICD-10-CM | POA: Diagnosis not present

## 2020-02-01 DIAGNOSIS — Z8614 Personal history of Methicillin resistant Staphylococcus aureus infection: Secondary | ICD-10-CM

## 2020-02-01 DIAGNOSIS — Z885 Allergy status to narcotic agent status: Secondary | ICD-10-CM

## 2020-02-01 DIAGNOSIS — Y831 Surgical operation with implant of artificial internal device as the cause of abnormal reaction of the patient, or of later complication, without mention of misadventure at the time of the procedure: Secondary | ICD-10-CM | POA: Diagnosis present

## 2020-02-01 DIAGNOSIS — N183 Chronic kidney disease, stage 3 unspecified: Secondary | ICD-10-CM | POA: Diagnosis present

## 2020-02-01 DIAGNOSIS — Z6841 Body Mass Index (BMI) 40.0 and over, adult: Secondary | ICD-10-CM

## 2020-02-01 DIAGNOSIS — T8483XA Hemorrhage due to internal orthopedic prosthetic devices, implants and grafts, initial encounter: Secondary | ICD-10-CM | POA: Diagnosis not present

## 2020-02-01 DIAGNOSIS — I129 Hypertensive chronic kidney disease with stage 1 through stage 4 chronic kidney disease, or unspecified chronic kidney disease: Secondary | ICD-10-CM | POA: Diagnosis present

## 2020-02-01 DIAGNOSIS — E1165 Type 2 diabetes mellitus with hyperglycemia: Secondary | ICD-10-CM | POA: Diagnosis not present

## 2020-02-01 DIAGNOSIS — R58 Hemorrhage, not elsewhere classified: Secondary | ICD-10-CM | POA: Diagnosis not present

## 2020-02-01 DIAGNOSIS — E785 Hyperlipidemia, unspecified: Secondary | ICD-10-CM | POA: Diagnosis present

## 2020-02-01 DIAGNOSIS — Z791 Long term (current) use of non-steroidal anti-inflammatories (NSAID): Secondary | ICD-10-CM

## 2020-02-01 DIAGNOSIS — D62 Acute posthemorrhagic anemia: Secondary | ICD-10-CM | POA: Diagnosis not present

## 2020-02-01 DIAGNOSIS — F419 Anxiety disorder, unspecified: Secondary | ICD-10-CM | POA: Diagnosis present

## 2020-02-01 DIAGNOSIS — R6889 Other general symptoms and signs: Secondary | ICD-10-CM | POA: Diagnosis not present

## 2020-02-01 DIAGNOSIS — Z96651 Presence of right artificial knee joint: Secondary | ICD-10-CM

## 2020-02-01 DIAGNOSIS — Z87891 Personal history of nicotine dependence: Secondary | ICD-10-CM

## 2020-02-01 DIAGNOSIS — Z8612 Personal history of poliomyelitis: Secondary | ICD-10-CM

## 2020-02-01 HISTORY — PX: I & D EXTREMITY: SHX5045

## 2020-02-01 LAB — BASIC METABOLIC PANEL
Anion gap: 6 (ref 5–15)
BUN: 24 mg/dL — ABNORMAL HIGH (ref 8–23)
CO2: 28 mmol/L (ref 22–32)
Calcium: 8.2 mg/dL — ABNORMAL LOW (ref 8.9–10.3)
Chloride: 100 mmol/L (ref 98–111)
Creatinine, Ser: 1.77 mg/dL — ABNORMAL HIGH (ref 0.61–1.24)
GFR calc Af Amer: 43 mL/min — ABNORMAL LOW (ref >60)
GFR calc non Af Amer: 37 mL/min — ABNORMAL LOW (ref >60)
Glucose, Bld: 280 mg/dL — ABNORMAL HIGH (ref 70–99)
Potassium: 4.1 mmol/L (ref 3.5–5.1)
Sodium: 134 mmol/L — ABNORMAL LOW (ref 135–145)

## 2020-02-01 LAB — CBC WITH DIFFERENTIAL/PLATELET
Abs Immature Granulocytes: 0.04 10*3/uL (ref 0.00–0.07)
Basophils Absolute: 0.1 10*3/uL (ref 0.0–0.1)
Basophils Relative: 1 %
Eosinophils Absolute: 0.1 10*3/uL (ref 0.0–0.5)
Eosinophils Relative: 1 %
HCT: 23.1 % — ABNORMAL LOW (ref 39.0–52.0)
Hemoglobin: 7.9 g/dL — ABNORMAL LOW (ref 13.0–17.0)
Immature Granulocytes: 0 %
Lymphocytes Relative: 15 %
Lymphs Abs: 1.4 10*3/uL (ref 0.7–4.0)
MCH: 28.7 pg (ref 26.0–34.0)
MCHC: 34.2 g/dL (ref 30.0–36.0)
MCV: 84 fL (ref 80.0–100.0)
Monocytes Absolute: 0.9 10*3/uL (ref 0.1–1.0)
Monocytes Relative: 9 %
Neutro Abs: 6.9 10*3/uL (ref 1.7–7.7)
Neutrophils Relative %: 74 %
Platelets: 166 10*3/uL (ref 150–400)
RBC: 2.75 MIL/uL — ABNORMAL LOW (ref 4.22–5.81)
RDW: 14.8 % (ref 11.5–15.5)
WBC: 9.3 10*3/uL (ref 4.0–10.5)
nRBC: 0 % (ref 0.0–0.2)

## 2020-02-01 LAB — GLUCOSE, CAPILLARY: Glucose-Capillary: 232 mg/dL — ABNORMAL HIGH (ref 70–99)

## 2020-02-01 LAB — SARS CORONAVIRUS 2 BY RT PCR (HOSPITAL ORDER, PERFORMED IN ~~LOC~~ HOSPITAL LAB): SARS Coronavirus 2: NEGATIVE

## 2020-02-01 SURGERY — IRRIGATION AND DEBRIDEMENT EXTREMITY
Anesthesia: General | Site: Knee | Laterality: Right

## 2020-02-01 MED ORDER — FUROSEMIDE 40 MG PO TABS
40.0000 mg | ORAL_TABLET | Freq: Two times a day (BID) | ORAL | Status: DC
  Administered 2020-02-02 – 2020-02-03 (×4): 40 mg via ORAL
  Filled 2020-02-01 (×3): qty 1

## 2020-02-01 MED ORDER — METOCLOPRAMIDE HCL 10 MG PO TABS
10.0000 mg | ORAL_TABLET | Freq: Three times a day (TID) | ORAL | Status: DC
  Administered 2020-02-02 – 2020-02-03 (×6): 10 mg via ORAL
  Filled 2020-02-01 (×6): qty 1

## 2020-02-01 MED ORDER — LOSARTAN POTASSIUM 50 MG PO TABS
100.0000 mg | ORAL_TABLET | Freq: Every day | ORAL | Status: DC
  Administered 2020-02-02 – 2020-02-03 (×2): 100 mg via ORAL
  Filled 2020-02-01 (×2): qty 2

## 2020-02-01 MED ORDER — DEXAMETHASONE SODIUM PHOSPHATE 10 MG/ML IJ SOLN
INTRAMUSCULAR | Status: AC
  Filled 2020-02-01: qty 1

## 2020-02-01 MED ORDER — DIPHENHYDRAMINE HCL 12.5 MG/5ML PO ELIX: 12.5000 mg | ORAL_SOLUTION | ORAL | Status: DC | PRN

## 2020-02-01 MED ORDER — GABAPENTIN 300 MG PO CAPS
300.0000 mg | ORAL_CAPSULE | Freq: Three times a day (TID) | ORAL | Status: DC
  Administered 2020-02-02 – 2020-02-03 (×5): 300 mg via ORAL
  Filled 2020-02-01 (×5): qty 1

## 2020-02-01 MED ORDER — CLOBETASOL PROPIONATE 0.05 % EX CREA
1.0000 "application " | TOPICAL_CREAM | Freq: Two times a day (BID) | CUTANEOUS | Status: DC | PRN
  Filled 2020-02-01: qty 15

## 2020-02-01 MED ORDER — HYDROCODONE-ACETAMINOPHEN 5-325 MG PO TABS
1.0000 | ORAL_TABLET | ORAL | Status: DC | PRN
  Administered 2020-02-02 – 2020-02-03 (×5): 1 via ORAL
  Filled 2020-02-01 (×5): qty 1

## 2020-02-01 MED ORDER — BUPIVACAINE LIPOSOME 1.3 % IJ SUSP
INTRAMUSCULAR | Status: AC
  Filled 2020-02-01: qty 20

## 2020-02-01 MED ORDER — GABAPENTIN 300 MG PO CAPS
300.0000 mg | ORAL_CAPSULE | Freq: Every day | ORAL | Status: DC
Start: 2020-02-01 — End: 2020-02-01

## 2020-02-01 MED ORDER — FENTANYL CITRATE (PF) 100 MCG/2ML IJ SOLN
INTRAMUSCULAR | Status: AC
  Filled 2020-02-01: qty 2

## 2020-02-01 MED ORDER — ONDANSETRON HCL 4 MG/2ML IJ SOLN
INTRAMUSCULAR | Status: AC
  Filled 2020-02-01: qty 2

## 2020-02-01 MED ORDER — CEFAZOLIN SODIUM-DEXTROSE 2-4 GM/100ML-% IV SOLN
2.0000 g | INTRAVENOUS | Status: AC
  Administered 2020-02-01: 2 g via INTRAVENOUS

## 2020-02-01 MED ORDER — METOCLOPRAMIDE HCL 5 MG/ML IJ SOLN: 5.0000 mg | Freq: Three times a day (TID) | INTRAMUSCULAR | Status: DC | PRN

## 2020-02-01 MED ORDER — SODIUM CHLORIDE 0.9 % IV SOLN: Freq: Once | INTRAVENOUS | Status: AC

## 2020-02-01 MED ORDER — DEXAMETHASONE SODIUM PHOSPHATE 10 MG/ML IJ SOLN
INTRAMUSCULAR | Status: DC | PRN
  Administered 2020-02-01: 10 mg via INTRAVENOUS

## 2020-02-01 MED ORDER — PANTOPRAZOLE SODIUM 40 MG PO TBEC
40.0000 mg | DELAYED_RELEASE_TABLET | Freq: Two times a day (BID) | ORAL | Status: DC
  Administered 2020-02-02 – 2020-02-03 (×4): 40 mg via ORAL
  Filled 2020-02-01 (×4): qty 1

## 2020-02-01 MED ORDER — HYDROCODONE-ACETAMINOPHEN 7.5-325 MG PO TABS: 1.0000 | ORAL_TABLET | ORAL | Status: DC | PRN

## 2020-02-01 MED ORDER — HYDROXYZINE HCL 25 MG PO TABS
25.0000 mg | ORAL_TABLET | Freq: Three times a day (TID) | ORAL | Status: DC | PRN
  Filled 2020-02-01: qty 1

## 2020-02-01 MED ORDER — PROPOFOL 10 MG/ML IV BOLUS
INTRAVENOUS | Status: AC
  Filled 2020-02-01: qty 20

## 2020-02-01 MED ORDER — ACETAMINOPHEN 10 MG/ML IV SOLN
INTRAVENOUS | Status: DC | PRN
  Administered 2020-02-01: 1000 mg via INTRAVENOUS

## 2020-02-01 MED ORDER — ADULT MULTIVITAMIN W/MINERALS CH
1.0000 | ORAL_TABLET | Freq: Every day | ORAL | Status: DC
  Administered 2020-02-02 – 2020-02-03 (×2): 1 via ORAL
  Filled 2020-02-01 (×2): qty 1

## 2020-02-01 MED ORDER — MORPHINE SULFATE (PF) 2 MG/ML IV SOLN: 0.5000 mg | INTRAVENOUS | Status: DC | PRN

## 2020-02-01 MED ORDER — ACETAMINOPHEN 10 MG/ML IV SOLN
INTRAVENOUS | Status: AC
  Filled 2020-02-01: qty 100

## 2020-02-01 MED ORDER — TIMOLOL MALEATE 0.5 % OP SOLN
1.0000 [drp] | Freq: Two times a day (BID) | OPHTHALMIC | Status: DC
  Administered 2020-02-02 (×3): 1 [drp] via OPHTHALMIC
  Filled 2020-02-01: qty 5

## 2020-02-01 MED ORDER — SENNOSIDES-DOCUSATE SODIUM 8.6-50 MG PO TABS
1.0000 | ORAL_TABLET | Freq: Two times a day (BID) | ORAL | Status: DC
  Administered 2020-02-02 – 2020-02-03 (×4): 1 via ORAL
  Filled 2020-02-01 (×4): qty 1

## 2020-02-01 MED ORDER — BUPIVACAINE HCL (PF) 0.25 % IJ SOLN
INTRAMUSCULAR | Status: DC | PRN
  Administered 2020-02-01: 60 mL

## 2020-02-01 MED ORDER — LIRAGLUTIDE 18 MG/3ML ~~LOC~~ SOPN
1.2000 mg | PEN_INJECTOR | Freq: Every day | SUBCUTANEOUS | Status: DC
  Administered 2020-02-03: 1.2 mg via SUBCUTANEOUS

## 2020-02-01 MED ORDER — CLORAZEPATE DIPOTASSIUM 7.5 MG PO TABS: 7.5000 mg | ORAL_TABLET | Freq: Two times a day (BID) | ORAL | Status: DC | PRN

## 2020-02-01 MED ORDER — FLEET ENEMA 7-19 GM/118ML RE ENEM: 1.0000 | ENEMA | Freq: Once | RECTAL | Status: DC | PRN

## 2020-02-01 MED ORDER — MAGNESIUM HYDROXIDE 400 MG/5ML PO SUSP
30.0000 mL | Freq: Every day | ORAL | Status: DC
  Administered 2020-02-02 – 2020-02-03 (×2): 30 mL via ORAL
  Filled 2020-02-01 (×2): qty 30

## 2020-02-01 MED ORDER — ONDANSETRON HCL 4 MG PO TABS: 4.0000 mg | ORAL_TABLET | Freq: Four times a day (QID) | ORAL | Status: DC | PRN

## 2020-02-01 MED ORDER — CEFAZOLIN SODIUM-DEXTROSE 2-4 GM/100ML-% IV SOLN
2.0000 g | Freq: Four times a day (QID) | INTRAVENOUS | Status: AC
  Administered 2020-02-02 (×4): 2 g via INTRAVENOUS
  Filled 2020-02-01 (×5): qty 100

## 2020-02-01 MED ORDER — AMLODIPINE BESYLATE 10 MG PO TABS
10.0000 mg | ORAL_TABLET | Freq: Every day | ORAL | Status: DC
  Administered 2020-02-02 – 2020-02-03 (×2): 10 mg via ORAL
  Filled 2020-02-01 (×2): qty 1

## 2020-02-01 MED ORDER — LIDOCAINE HCL (PF) 2 % IJ SOLN
INTRAMUSCULAR | Status: AC
  Filled 2020-02-01: qty 5

## 2020-02-01 MED ORDER — SODIUM CHLORIDE 0.9 % IV SOLN: INTRAVENOUS | Status: DC

## 2020-02-01 MED ORDER — TIMOLOL MALEATE 0.25 % OP SOLN
1.0000 [drp] | Freq: Two times a day (BID) | OPHTHALMIC | Status: DC
  Administered 2020-02-02: 1 [drp] via OPHTHALMIC
  Filled 2020-02-01: qty 5

## 2020-02-01 MED ORDER — GLYCOPYRROLATE 0.2 MG/ML IJ SOLN
INTRAMUSCULAR | Status: DC | PRN
  Administered 2020-02-01: .2 mg via INTRAVENOUS

## 2020-02-01 MED ORDER — PHENOL 1.4 % MT LIQD
1.0000 | OROMUCOSAL | Status: DC | PRN
  Filled 2020-02-01: qty 177

## 2020-02-01 MED ORDER — CELECOXIB 200 MG PO CAPS
200.0000 mg | ORAL_CAPSULE | Freq: Two times a day (BID) | ORAL | Status: DC
  Administered 2020-02-02 – 2020-02-03 (×4): 200 mg via ORAL
  Filled 2020-02-01 (×4): qty 1

## 2020-02-01 MED ORDER — VITAMIN D 25 MCG (1000 UNIT) PO TABS
10000.0000 [IU] | ORAL_TABLET | Freq: Every day | ORAL | Status: DC
  Administered 2020-02-02 – 2020-02-03 (×2): 10000 [IU] via ORAL
  Filled 2020-02-01 (×2): qty 10

## 2020-02-01 MED ORDER — METOCLOPRAMIDE HCL 10 MG PO TABS: 5.0000 mg | ORAL_TABLET | Freq: Three times a day (TID) | ORAL | Status: DC | PRN

## 2020-02-01 MED ORDER — METFORMIN HCL 500 MG PO TABS
500.0000 mg | ORAL_TABLET | Freq: Two times a day (BID) | ORAL | Status: DC
  Administered 2020-02-02 – 2020-02-03 (×4): 500 mg via ORAL
  Filled 2020-02-01 (×3): qty 1

## 2020-02-01 MED ORDER — CELECOXIB 200 MG PO CAPS: 200.0000 mg | ORAL_CAPSULE | Freq: Two times a day (BID) | ORAL | Status: DC

## 2020-02-01 MED ORDER — SODIUM CHLORIDE 0.9 % IV SOLN
INTRAVENOUS | Status: DC | PRN
Start: 2020-02-01 — End: 2020-02-01

## 2020-02-01 MED ORDER — CARVEDILOL 12.5 MG PO TABS
12.5000 mg | ORAL_TABLET | Freq: Two times a day (BID) | ORAL | Status: DC
  Administered 2020-02-02 – 2020-02-03 (×3): 12.5 mg via ORAL
  Filled 2020-02-01 (×3): qty 1

## 2020-02-01 MED ORDER — CEFAZOLIN SODIUM-DEXTROSE 2-4 GM/100ML-% IV SOLN
INTRAVENOUS | Status: AC
  Filled 2020-02-01: qty 100

## 2020-02-01 MED ORDER — ALUM & MAG HYDROXIDE-SIMETH 200-200-20 MG/5ML PO SUSP: 30.0000 mL | ORAL | Status: DC | PRN

## 2020-02-01 MED ORDER — FENTANYL CITRATE (PF) 100 MCG/2ML IJ SOLN: 25.0000 ug | INTRAMUSCULAR | Status: DC | PRN

## 2020-02-01 MED ORDER — LIDOCAINE HCL (CARDIAC) PF 100 MG/5ML IV SOSY
PREFILLED_SYRINGE | INTRAVENOUS | Status: DC | PRN
  Administered 2020-02-01: 50 mg via INTRAVENOUS

## 2020-02-01 MED ORDER — INSULIN ASPART 100 UNIT/ML ~~LOC~~ SOLN
SUBCUTANEOUS | Status: AC
  Administered 2020-02-01: 7 [IU] via SUBCUTANEOUS
  Filled 2020-02-01: qty 1

## 2020-02-01 MED ORDER — INSULIN ASPART 100 UNIT/ML ~~LOC~~ SOLN
0.0000 [IU] | Freq: Three times a day (TID) | SUBCUTANEOUS | Status: DC
  Administered 2020-02-02: 8 [IU] via SUBCUTANEOUS
  Administered 2020-02-02: 3 [IU] via SUBCUTANEOUS
  Administered 2020-02-02 – 2020-02-03 (×3): 5 [IU] via SUBCUTANEOUS
  Filled 2020-02-01 (×5): qty 1

## 2020-02-01 MED ORDER — FENTANYL CITRATE (PF) 100 MCG/2ML IJ SOLN
INTRAMUSCULAR | Status: DC | PRN
  Administered 2020-02-01 (×4): 50 ug via INTRAVENOUS

## 2020-02-01 MED ORDER — ACETAMINOPHEN 325 MG PO TABS: 325.0000 mg | ORAL_TABLET | Freq: Four times a day (QID) | ORAL | Status: DC | PRN

## 2020-02-01 MED ORDER — SUCCINYLCHOLINE CHLORIDE 20 MG/ML IJ SOLN
INTRAMUSCULAR | Status: DC | PRN
  Administered 2020-02-01: 100 mg via INTRAVENOUS

## 2020-02-01 MED ORDER — PHENYLEPHRINE HCL (PRESSORS) 10 MG/ML IV SOLN
INTRAVENOUS | Status: DC | PRN
  Administered 2020-02-01: 200 ug via INTRAVENOUS
  Administered 2020-02-01 (×4): 100 ug via INTRAVENOUS

## 2020-02-01 MED ORDER — ISOSORBIDE MONONITRATE ER 60 MG PO TB24
60.0000 mg | ORAL_TABLET | Freq: Every day | ORAL | Status: DC
  Administered 2020-02-02 – 2020-02-03 (×2): 60 mg via ORAL
  Filled 2020-02-01 (×2): qty 1

## 2020-02-01 MED ORDER — ENOXAPARIN SODIUM 30 MG/0.3ML ~~LOC~~ SOLN
30.0000 mg | Freq: Two times a day (BID) | SUBCUTANEOUS | Status: DC
  Administered 2020-02-02 – 2020-02-03 (×3): 30 mg via SUBCUTANEOUS
  Filled 2020-02-01 (×3): qty 0.3

## 2020-02-01 MED ORDER — ROSUVASTATIN CALCIUM 20 MG PO TABS
20.0000 mg | ORAL_TABLET | Freq: Every day | ORAL | Status: DC
  Administered 2020-02-02: 20 mg via ORAL
  Filled 2020-02-01 (×2): qty 1

## 2020-02-01 MED ORDER — BUPIVACAINE HCL (PF) 0.25 % IJ SOLN
INTRAMUSCULAR | Status: AC
  Filled 2020-02-01: qty 30

## 2020-02-01 MED ORDER — ONDANSETRON HCL 4 MG/2ML IJ SOLN: 4.0000 mg | Freq: Four times a day (QID) | INTRAMUSCULAR | Status: DC | PRN

## 2020-02-01 MED ORDER — SUCCINYLCHOLINE CHLORIDE 200 MG/10ML IV SOSY
PREFILLED_SYRINGE | INTRAVENOUS | Status: AC
  Filled 2020-02-01: qty 10

## 2020-02-01 MED ORDER — ONDANSETRON HCL 4 MG/2ML IJ SOLN
INTRAMUSCULAR | Status: DC | PRN
  Administered 2020-02-01: 4 mg via INTRAVENOUS

## 2020-02-01 MED ORDER — FERROUS SULFATE 325 (65 FE) MG PO TABS
325.0000 mg | ORAL_TABLET | Freq: Two times a day (BID) | ORAL | Status: DC
  Administered 2020-02-02 – 2020-02-03 (×4): 325 mg via ORAL
  Filled 2020-02-01 (×3): qty 1

## 2020-02-01 MED ORDER — NEOMYCIN-POLYMYXIN B GU 40-200000 IR SOLN
Status: DC | PRN
  Administered 2020-02-01: 12 mL

## 2020-02-01 MED ORDER — SODIUM CHLORIDE 0.9 % IV SOLN
INTRAVENOUS | Status: DC | PRN
  Administered 2020-02-01: 60 mL

## 2020-02-01 MED ORDER — INSULIN ASPART 100 UNIT/ML ~~LOC~~ SOLN: 7.0000 [IU] | Freq: Once | SUBCUTANEOUS | Status: AC

## 2020-02-01 MED ORDER — PROPOFOL 10 MG/ML IV BOLUS
INTRAVENOUS | Status: DC | PRN
  Administered 2020-02-01: 150 mg via INTRAVENOUS
  Administered 2020-02-01: 50 mg via INTRAVENOUS

## 2020-02-01 MED ORDER — GLIPIZIDE 5 MG PO TABS
2.5000 mg | ORAL_TABLET | Freq: Two times a day (BID) | ORAL | Status: DC
  Administered 2020-02-02 – 2020-02-03 (×3): 2.5 mg via ORAL
  Filled 2020-02-01 (×4): qty 0.5

## 2020-02-01 MED ORDER — MENTHOL 3 MG MT LOZG
1.0000 | LOZENGE | OROMUCOSAL | Status: DC | PRN
  Filled 2020-02-01: qty 9

## 2020-02-01 MED ORDER — TRAMADOL HCL 50 MG PO TABS: 50.0000 mg | ORAL_TABLET | ORAL | Status: DC | PRN

## 2020-02-01 MED ORDER — DEXMEDETOMIDINE HCL IN NACL 200 MCG/50ML IV SOLN
INTRAVENOUS | Status: DC | PRN
  Administered 2020-02-01: 12 ug via INTRAVENOUS

## 2020-02-01 MED ORDER — BISACODYL 10 MG RE SUPP: 10.0000 mg | Freq: Every day | RECTAL | Status: DC | PRN

## 2020-02-01 MED ORDER — GLYCOPYRROLATE 0.2 MG/ML IJ SOLN
INTRAMUSCULAR | Status: AC
  Filled 2020-02-01: qty 1

## 2020-02-01 SURGICAL SUPPLY — 75 items
BATTERY INSTRU NAVIGATION (MISCELLANEOUS) IMPLANT
BLADE SAW 70X12.5 (BLADE) IMPLANT
BLADE SAW 90X13X1.19 OSCILLAT (BLADE) ×1 IMPLANT
BLADE SAW 90X25X1.19 OSCILLAT (BLADE) ×1 IMPLANT
BTRY SRG DRVR LF (MISCELLANEOUS)
CANISTER SUCT 3000ML PPV (MISCELLANEOUS) ×2 IMPLANT
CANISTER WOUND CARE 500ML ATS (WOUND CARE) ×1 IMPLANT
COOLER POLAR GLACIER W/PUMP (MISCELLANEOUS) ×1 IMPLANT
COVER WAND RF STERILE (DRAPES) ×2 IMPLANT
CUFF TOURN SGL QUICK 24 (TOURNIQUET CUFF)
CUFF TOURN SGL QUICK 30 (TOURNIQUET CUFF) ×2
CUFF TRNQT CYL 24X4X16.5-23 (TOURNIQUET CUFF) IMPLANT
CUFF TRNQT CYL 30X4X21-28X (TOURNIQUET CUFF) IMPLANT
DRAPE 3/4 80X56 (DRAPES) ×2 IMPLANT
DRSG DERMACEA 8X12 NADH (GAUZE/BANDAGES/DRESSINGS) IMPLANT
DRSG OPSITE POSTOP 4X14 (GAUZE/BANDAGES/DRESSINGS) ×1 IMPLANT
DRSG TEGADERM 4X4.75 (GAUZE/BANDAGES/DRESSINGS) ×1 IMPLANT
DURAPREP 26ML APPLICATOR (WOUND CARE) ×4 IMPLANT
ELECT REM PT RETURN 9FT ADLT (ELECTROSURGICAL) ×2
ELECTRODE REM PT RTRN 9FT ADLT (ELECTROSURGICAL) ×1 IMPLANT
EX-PIN ORTHOLOCK NAV 4X150 (PIN) ×2 IMPLANT
GAUZE 4X4 16PLY RFD (DISPOSABLE) IMPLANT
GLOVE BIO SURGEON STRL SZ7.5 (GLOVE) ×5 IMPLANT
GLOVE BIOGEL M STRL SZ7.5 (GLOVE) ×4 IMPLANT
GLOVE BIOGEL PI IND STRL 7.5 (GLOVE) ×1 IMPLANT
GLOVE BIOGEL PI INDICATOR 7.5 (GLOVE) ×4
GLOVE INDICATOR 8.0 STRL GRN (GLOVE) ×8 IMPLANT
GOWN STRL REUS W/ TWL LRG LVL3 (GOWN DISPOSABLE) ×2 IMPLANT
GOWN STRL REUS W/ TWL XL LVL3 (GOWN DISPOSABLE) ×1 IMPLANT
GOWN STRL REUS W/TWL LRG LVL3 (GOWN DISPOSABLE) ×8
GOWN STRL REUS W/TWL XL LVL3 (GOWN DISPOSABLE)
HEMOVAC 400CC 10FR (MISCELLANEOUS) ×1 IMPLANT
HOLDER FOLEY CATH W/STRAP (MISCELLANEOUS) ×1 IMPLANT
HOOD PEEL AWAY FLYTE STAYCOOL (MISCELLANEOUS) ×4 IMPLANT
INSERT TIBIAL KNEE SZ 5 12MM (Insert) IMPLANT
KIT TURNOVER KIT A (KITS) ×2 IMPLANT
KNIFE SCULPS 14X20 (INSTRUMENTS) IMPLANT
LABEL OR SOLS (LABEL) IMPLANT
MANIFOLD NEPTUNE II (INSTRUMENTS) ×2 IMPLANT
NDL SAFETY ECLIPSE 18X1.5 (NEEDLE) ×1 IMPLANT
NEEDLE HYPO 18GX1.5 SHARP (NEEDLE)
NEEDLE SPNL 20GX3.5 QUINCKE YW (NEEDLE) ×2 IMPLANT
NS IRRIG 500ML POUR BTL (IV SOLUTION) ×3 IMPLANT
PACK TOTAL KNEE (MISCELLANEOUS) ×2 IMPLANT
PAD ABD DERMACEA PRESS 5X9 (GAUZE/BANDAGES/DRESSINGS) ×1 IMPLANT
PAD WRAPON POLAR KNEE (MISCELLANEOUS) ×1 IMPLANT
PENCIL SMOKE EVACUATOR COATED (MISCELLANEOUS) ×1 IMPLANT
PENCIL SMOKE ULTRAEVAC 22 CON (MISCELLANEOUS) ×2 IMPLANT
PIN FIXATION 1/8DIA X 3INL (PIN) ×3 IMPLANT
PREVENA INCISION MGT 90 150 (MISCELLANEOUS) ×2 IMPLANT
PREVENA RESTOR AXIOFORM 29X28 (GAUZE/BANDAGES/DRESSINGS) ×1 IMPLANT
PULSAVAC PLUS IRRIG FAN TIP (DISPOSABLE) ×2
SOL .9 NS 3000ML IRR  AL (IV SOLUTION) ×2
SOL .9 NS 3000ML IRR AL (IV SOLUTION) ×1
SOL .9 NS 3000ML IRR UROMATIC (IV SOLUTION) ×1 IMPLANT
SOL PREP PVP 2OZ (MISCELLANEOUS) ×2
SOLUTION PREP PVP 2OZ (MISCELLANEOUS) ×1 IMPLANT
SPONGE DRAIN TRACH 4X4 STRL 2S (GAUZE/BANDAGES/DRESSINGS) ×1 IMPLANT
STAPLER SKIN PROX 35W (STAPLE) ×2 IMPLANT
STOCKINETTE BIAS CUT 6 980064 (GAUZE/BANDAGES/DRESSINGS) ×1 IMPLANT
STOCKINETTE IMPERV 14X48 (MISCELLANEOUS) ×2 IMPLANT
STRAP TIBIA SHORT (MISCELLANEOUS) ×1 IMPLANT
SUCTION FRAZIER HANDLE 10FR (MISCELLANEOUS) ×2
SUCTION TUBE FRAZIER 10FR DISP (MISCELLANEOUS) ×1 IMPLANT
SUT VIC AB 0 CT1 36 (SUTURE) ×4 IMPLANT
SUT VIC AB 1 CT1 36 (SUTURE) ×4 IMPLANT
SUT VIC AB 2-0 CT2 27 (SUTURE) ×2 IMPLANT
SYR 20ML LL LF (SYRINGE) ×1 IMPLANT
SYR 30ML LL (SYRINGE) ×4 IMPLANT
TIBIAL INSERT KNEE SZ 5 12MM (Insert) ×2 IMPLANT
TIP FAN IRRIG PULSAVAC PLUS (DISPOSABLE) ×1 IMPLANT
TOWEL OR 17X26 4PK STRL BLUE (TOWEL DISPOSABLE) ×2 IMPLANT
TOWER CARTRIDGE SMART MIX (DISPOSABLE) ×1 IMPLANT
TRAY FOLEY MTR SLVR 16FR STAT (SET/KITS/TRAYS/PACK) ×2 IMPLANT
WRAPON POLAR PAD KNEE (MISCELLANEOUS)

## 2020-02-01 NOTE — ED Provider Notes (Signed)
[facility] Emergency Department Provider Note   ____________________________________________   First MD Initiated Contact with Patient 02/01/20 1255     (approximate)  I have reviewed the triage vital signs and the nursing notes.   HISTORY  Chief Complaint Knee Problem    HPI Daniel Kline is a 74 y.o. male with past medical history of hypertension, hyperlipidemia, diabetes, and atrial flutter on Eliquis who presents to the ED complaining of postop bleeding. Patient had total knee replacement performed 4 days ago with no complications, was discharged from the hospital 2 days ago. He started having increased bleeding at his incision yesterday, for which he was evaluated in the ED and had wound VAC placed by Dr. Marry Guan. His wife has been performing wound VAC changes since then and they noticed significant increase in bleeding this morning around 11 AM. He reports bleeding from around the wound VAC site that they were unable to control. He had initially planned to go to the orthopedic clinic, but he started to feel lightheaded and EMS was called, who found the patient to be hypotensive. His blood pressure gradually improved without intervention during transport.        Past Medical History:  Diagnosis Date  . Anemia    H/O  . Anxiety   . Arthritis   . Chronic kidney disease   . Complication of anesthesia   . 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 Diabetes mellitus without complication ([place])   . Dysrhythmia    A FLUTTER  . Family history of adverse reaction to anesthesia    PT WAS ADOPTED  . HLD (hyperlipidemia)   . HTN (hypertension)   . HTN (hypertension)   . Hx of dysplastic nevus 2019   multiple sites  . 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 MRSA (methicillin resistant Staphylococcus aureus)    after back surgery  . OSA (obstructive sleep apnea)    USES BIPAP  . Polio   . POLIOMYELITIS 01/12/2010    Right arm affected  . PONV (postoperative nausea and vomiting)     Patient Active Problem List   Diagnosis Date Noted  . Total knee replacement status 01/28/2020  . Encounter for long-term (current) use of high-risk medication 06/14/2019  . Seronegative rheumatoid arthritis ([place]) 06/14/2019  . Chronic narcotic dependence ([place]) 05/18/2019  . Anti-cyclic citrullinated peptide antibody positive 05/03/2019  . Synovitis of hand 05/03/2019  . Radial neuropathy, left 09/06/2018  . Type 2 diabetes mellitus, uncontrolled, with renal complications ([place]) 17/51/0258  . Chronic bilateral low back pain without sciatica 06/09/2018  . Knee pain, chronic 04/12/2018  . OSA (obstructive sleep apnea) 09/07/2017  . History of poliomyelitis 08/27/2017  . Localized edema 08/12/2017  . Actinic keratosis 07/26/2017  . Cutaneous horn 07/26/2017  . History of total hip arthroplasty, bilateral 06/09/2016  . GERD (gastroesophageal reflux disease) 04/21/2016  . PONV (postoperative nausea and vomiting) 04/21/2016  . Skin cancer 04/21/2016  . Asymptomatic gallstones 01/06/2016  . Atrial flutter ([place]) 10/29/2015  . Venous stasis dermatitis of both lower extremities 10/29/2015  . Chronic kidney disease (CKD), stage III (moderate) ([place]) 09/27/2015  . Morbid obesity ([place]) 09/27/2015  . Primary osteoarthritis of right knee 08/26/2015  . 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 Hypercholesterolemia 12/20/2011  . Infection and inflammatory reaction due to internal prosthetic device, implant, and graft 12/20/2011  . Essential hypertension 01/12/2010    Past Surgical History:  Procedure Laterality Date  .  BACK SURGERY     LUMBAR  . CARDIAC ELECTROPHYSIOLOGY STUDY AND ABLATION  2019  . COLONOSCOPY WITH PROPOFOL N/A 04/04/2019   Procedure: COLONOSCOPY WITH PROPOFOL;  Surgeon: Toledo, Benay Pike, MD;  Location: [facility] ENDOSCOPY;  Service: Gastroenterology;  Laterality: N/A;  . INCISION AND DRAINAGE     BACK-MRSA  INFECTION AFTER BACK SURGERY  . KNEE ARTHROPLASTY Right 01/28/2020   Procedure: COMPUTER ASSISTED TOTAL KNEE ARTHROPLASTY;  Surgeon: Dereck Leep, MD;  Location: [facility] ORS;  Service: Orthopedics;  Laterality: Right;  . MOUTH SURGERY    . right elbow surgery    . right knee surgery    . right shoulder surgery     from polio damage  . TONSILLECTOMY    . TOTAL HIP ARTHROPLASTY Bilateral 04/2016    Prior to Admission medications   Medication Sig Start Date End Date Taking? Authorizing Provider  amLODipine (NORVASC) 10 MG tablet TAKE 1 TABLET BY MOUTH  DAILY Patient taking differently: Take 10 mg by mouth daily.  09/03/19   [name], MD  carvedilol (COREG) 12.5 MG tablet Take 1 tablet (12.5 mg total) by mouth 2 (two) times daily. 01/23/20 04/22/20  Verta Ellen., NP  celecoxib (CELEBREX) 200 MG capsule Take 1 capsule (200 mg total) by mouth 2 (two) times daily. 01/30/20   Fausto Skillern, PA-C  Cholecalciferol (VITAMIN D-3 PO) Take 10,000 Units by mouth daily.     [provider]  clobetasol cream (TEMOVATE) 1.61 % Apply 1 application topically 2 (two) times daily as needed (irritation).  06/27/15   [provider]  clorazepate (TRANXENE) 7.5 MG tablet TAKE 1 TABLET (7.5 MG TOTAL) BY MOUTH 2 (TWO) TIMES DAILY AS NEEDED FOR ANXIETY. 01/21/20   [name], MD  Elastic Bandages & Supports (MEDICAL COMPRESSION STOCKINGS) MISC 2 Units by Does not apply route daily. 08/19/17   Merlyn Lot, MD  enoxaparin (LOVENOX) 40 MG/0.4ML injection Inject 0.4 mLs (40 mg total) into the skin daily for 14 days. 01/30/20 02/13/20  Tamala Julian B, PA-C  EPINEPHrine 0.3 mg/0.3 mL IJ SOAJ injection Inject 0.3 mLs (0.3 mg total) into the muscle as needed for anaphylaxis. 11/22/19   [name], MD  Ferrous Sulfate (IRON PO) Take 1 tablet by mouth at bedtime.    [provider]  furosemide (LASIX) 40 MG tablet Take 40 mg by mouth 2 (two) times daily.    [provider]  gabapentin (NEURONTIN) 300 MG capsule TAKE 1 CAPSULE BY MOUTH 3  TIMES DAILY Patient taking differently: Take 300 mg by mouth 3 (three) times daily.  09/11/19   [name], MD  glipiZIDE (GLUCOTROL) 5 MG tablet TAKE ONE-HALF TABLET BY  MOUTH 2 TIMES DAILY BEFORE  A MEAL. Patient taking differently: Take 2.5 mg by mouth 2 (two) times daily before a meal.  11/26/19   [name], MD  hydrOXYzine (ATARAX/VISTARIL) 25 MG tablet TAKE ONE TABLET EVERY EIGHT HOURS AS NEEDED Patient taking differently: Take 25 mg by mouth every 8 (eight) hours as needed for anxiety.  12/31/19   [name], MD  isosorbide mononitrate (IMDUR) 60 MG 24 hr tablet Take 60 mg by mouth daily before lunch.  08/22/18   [provider]  liraglutide (VICTOZA) 18 MG/3ML SOPN Inject 0.2 mLs (1.2 mg total) into the skin daily. 08/23/19   [name], MD  losartan (COZAAR) 100 MG tablet Take 1 tablet (100 mg total) by mouth daily. Patient taking  differently: Take 100 mg by mouth daily before lunch.  02/05/19   Gollan, Kathlene November, MD  metFORMIN (GLUCOPHAGE) 500 MG tablet TAKE 1 TABLET BY MOUTH TWO  TIMES DAILY Patient taking differently: Take 500 mg by mouth 2 (two) times daily with a meal.  09/11/19   [name], MD  Multiple Vitamins-Minerals (MULTIVITAL) tablet Take 1 tablet by mouth daily.      [provider]  ONETOUCH ULTRA test strip CHECK BLOOD SUGAR 3 TIMES DAILY AS DIRECTED 01/04/20   Crecencio Mc, MD  oxyCODONE (OXY IR/ROXICODONE) 5 MG immediate release tablet Take 1 tablet (5 mg total) by mouth every 4 (four) hours as needed for moderate pain (pain score 4-6). 01/30/20   Tamala Julian B, PA-C  rosuvastatin (CRESTOR) 20 MG tablet TAKE ONE TABLET EVERY DAY Patient taking differently: Take 20 mg by mouth daily.  09/06/19   [name], MD  timolol (BETIMOL) 0.25 % ophthalmic solution Place 1-2 drops into both eyes 2 (two) times daily.    [provider]    timolol (TIMOPTIC) 0.5 % ophthalmic solution Apply 1 drop to eye 2 (two) times daily. 01/11/20   [provider]  traMADol (ULTRAM) 50 MG tablet Take 1 tablet (50 mg total) by mouth every 4 (four) hours as needed for moderate pain. 01/30/20   Fausto Skillern, PA-C    Allergies Bee venom, Oxycodone, Hydromorphone, and Zolpidem  Family History  Adopted: Yes    Social History Social History   Tobacco Use  . Smoking status: Former Smoker    Types: Cigars    Quit date: 09/23/1990    Years since quitting: 29.3  . Smokeless tobacco: Former Systems developer    Quit date: 02/25/2006  Vaping Use  . Vaping Use: Never used  Substance Use Topics  . Alcohol use: No    Alcohol/week: 0.0 standard drinks  . Drug use: No    Review of Systems  Constitutional: No fever/chills Eyes: No visual changes. ENT: No sore throat. Cardiovascular: Denies chest pain. Respiratory: Denies shortness of breath. Gastrointestinal: No abdominal pain.  No nausea, no vomiting.  No diarrhea.  No constipation. Genitourinary: Negative for dysuria. Musculoskeletal: Negative for back pain. Positive for bleeding incision. Skin: Negative for rash. Neurological: Negative for headaches, focal weakness or numbness.  ____________________________________________   PHYSICAL EXAM:  VITAL SIGNS: ED Triage Vitals  Enc Vitals Group     BP      Pulse      Resp      Temp      Temp src      SpO2      Weight      Height      Head Circumference      Peak Flow      Pain Score      Pain Loc      Pain Edu?      Excl. in [place]?     Constitutional: Alert and oriented. Eyes: Conjunctivae are normal. Head: Atraumatic. Nose: No congestion/rhinnorhea. Mouth/Throat: Mucous membranes are moist. Neck: Normal ROM Cardiovascular: Normal rate, regular rhythm. Grossly normal heart sounds. Respiratory: Normal respiratory effort.  No retractions. Lungs CTAB. Gastrointestinal: Soft and nontender. No distention. Genitourinary:  deferred Musculoskeletal: No lower extremity tenderness nor edema. Wound VAC in place to right knee incision with coagulated blood around dressing but no active bleeding noted. 2+ DP pulses bilaterally. Neurologic:  Normal speech and language. No gross focal neurologic deficits are appreciated. Skin:  Skin is  warm, dry and intact. No rash noted. Psychiatric: Mood and affect are normal. Speech and behavior are normal.  ____________________________________________   LABS (all labs ordered are listed, but only abnormal results are displayed)  Labs Reviewed  CBC WITH DIFFERENTIAL/PLATELET - Abnormal; Notable for the following components:      Result Value   RBC 2.75 (*)    Hemoglobin 7.9 (*)    HCT 23.1 (*)    All other components within normal limits  BASIC METABOLIC PANEL - Abnormal; Notable for the following components:   Sodium 134 (*)    Glucose, Bld 280 (*)    BUN 24 (*)    Creatinine, Ser 1.77 (*)    Calcium 8.2 (*)    GFR calc non Af Amer 37 (*)    GFR calc Af Amer 43 (*)    All other components within normal limits  SARS CORONAVIRUS 2 BY RT PCR (HOSPITAL ORDER, [place] [facility])  TYPE AND SCREEN     PROCEDURES  Procedure(s) performed (including Critical Care):  Procedures   ____________________________________________   INITIAL IMPRESSION / ASSESSMENT AND PLAN / ED COURSE       74 year old male with past medical history of hypertension, hyperlipidemia, diabetes, and atrial flutter on Eliquis presents to the ED with bleeding from around wound VAC site following right total knee replacement 4 days prior. Bleeding seems to be controlled at this time, blood pressure is also normalized. He does appear to have had significant bleeding from around his wound VAC site, Dr. Cyndi Bender of orthopedics now at the bedside and will tentatively plan for exploration in the OR. Patient last had something to eat around 10 o'clock this morning and will need to wait  until at least 6 hours from then. No active bleeding at this time and there is no indication for transfusion, lab work pending.  Lab work remarkable for downtrending H&H at 7.9 from 10.3 yesterday.  He remains hemodynamically stable with no active bleeding and we will hold off on transfusion for now.      ____________________________________________   FINAL CLINICAL IMPRESSION(S) / ED DIAGNOSES  Final diagnoses:  Status post total right knee replacement     ED Discharge Orders    None       Note:  This document was prepared using Dragon voice recognition software and may include unintentional dictation errors.   Blake Divine, MD 02/01/20 [phone]

## 2020-02-01 NOTE — Op Note (Signed)
OPERATIVE NOTE  DATE OF SURGERY:  02/01/2020  PATIENT NAME:  Daniel Kline   DOB: 03-Jun-1946  MRN: [id number]  PRE-OPERATIVE DIAGNOSIS: Persistent bleeding from right knee incision status post right total knee arthroplasty  POST-OPERATIVE DIAGNOSIS: Hematoma, right knee  PROCEDURE: Right knee arthrotomy, evacuation of hematoma, irrigation and debridement, and polyethylene exchange  SURGEON:  Dereck Leep, Jr. M.D.  ASSISTANT: Cassell Smiles, PA-C (present and scrubbed throughout the case, critical for assistance with exposure, retraction, instrumentation, and closure)  ANESTHESIA: general  ESTIMATED BLOOD LOSS: 25 mL  FLUIDS REPLACED: 1000 mL of crystalloid  TOURNIQUET TIME: Not used  DRAINS: Prevena plus wound VAC  IMPLANTS UTILIZED: DePuy Attune size 5 12 mm stabilized rotating platform polyethylene insert.  INDICATIONS FOR SURGERY: Daniel Kline is a 74 y.o. year old male who underwent right total knee arthroplasty 4 days ago.  He initially had an uneventful postoperative course.  However, he was noted to have some incisional bloody drainage requiring a compression dressing.  He presented to the Emergency Department yesterday with some continued bloody drainage and a wound VAC was applied.  However, bloody drainage continued and it was felt that there was either a large hematoma versus an active hemorrhage within the knee.  After discussion of the risks and benefits of surgical intervention, the patient expressed understanding of the risks benefits and agree with plans for right knee arthrotomy, exploration of the right knee, and possible polyethylene exchange.   The risks, benefits, and alternatives were discussed at length including but not limited to the risks of infection, bleeding, nerve injury, stiffness, blood clots, the need for revision surgery, cardiopulmonary complications, among others, and they were willing to proceed.  PROCEDURE IN DETAIL: The patient was brought  into the operating room and, after adequate general anesthesia was achieved, a tourniquet was placed on the patient's upper thigh. The patient's knee and leg were cleaned and prepped with Betadine and draped in the usual sterile fashion. A "timeout" was performed as per usual protocol.  Staples were removed and the subcutaneous sutures were incised.  There was a small hematoma within the subcutaneous tissue and this was evacuated.  However, there was no gross hemorrhage involving the subcutaneous tissue.  It was thus elected to proceed with an arthrotomy of the right knee.  The previous Vicryl sutures were incised and removed.  A reasonably large hematoma was encountered within the knee.  The hematoma was evacuated without difficulty.  The tissue was carefully inspected and there was no apparent active bleeding.  The knee was placed in flexion and the size 512 mm polyethylene insert was removed so as to inspect the posterior recesses.  Again, no active bleeding was encountered but additional clot was evacuated from the posterior recess.  The knee was then fully extended and irrigated with 3000 cc of normal saline with antibiotic solution using pulsatile lavage and then suctioned dry.  Again, the knee was inspected carefully and there was no active bleeding.  Hemostasis was achieved using electrocautery.  A dilute solution of Betadine (17.5 mL and 500 cc of normal saline) was then used used for irrigant, allowing the solution to soak for approximately 5 minutes for evacuating the knee joint.  The knee was again inspected for any active bleeding. 20 mL of 1.3% Exparel and 60 mL of 0.25% Marcaine in 40 mL of normal saline was injected along the posterior capsule, medial and lateral gutters, and along the arthrotomy site. A 12 mm stabilized rotating platform polyethylene  insert was inserted and the knee was placed through a range of motion with excellent mediolateral soft tissue balancing appreciated and excellent  patellar tracking noted. The medial parapatellar portion of the incision was reapproximated using interrupted sutures of #1 Vicryl. Subcutaneous tissue was approximated in layers using first #0 Vicryl followed #2-0 Vicryl. The skin was approximated with skin staples. A Praveena plus wound VAC was applied followed by application of a sterile dressing.  The patient tolerated the procedure well and was transported to the recovery room in stable condition.    Elonzo Sopp P. Holley Bouche., M.D.

## 2020-02-01 NOTE — ED Notes (Signed)
Last meal was at 10am.   Surgeon and EDP at bedside to assess bleeding.

## 2020-02-01 NOTE — Anesthesia Postprocedure Evaluation (Signed)
Anesthesia Post Note  Patient: Daniel Kline  Procedure(s) Performed: IRRIGATION AND DEBRIDEMENT EXTREMITY with poly exchange (Right Knee)  Patient location during evaluation: PACU Anesthesia Type: General Level of consciousness: awake and alert Pain management: pain level controlled Vital Signs Assessment: post-procedure vital signs reviewed and stable Respiratory status: spontaneous breathing, nonlabored ventilation, respiratory function stable and patient connected to nasal cannula oxygen Cardiovascular status: blood pressure returned to baseline and stable Postop Assessment: no apparent nausea or vomiting Anesthetic complications: no   No complications documented.   Last Vitals:  Vitals:   02/01/20 2210 02/01/20 2232  BP: (!) 159/61 (!) 173/56  Pulse: 66 71  Resp: 12 17  Temp: 37.2 C 36.6 C  SpO2: 93% 99%    Last Pain:  Vitals:   02/01/20 2306  TempSrc:   PainSc: 0-No pain                 Precious Haws Gisel Vipond

## 2020-02-01 NOTE — ED Triage Notes (Addendum)
Pt arrives to ER via [facility] for bleeding from knee replacement with wound vac that was replaced yesterday. Pt wanted to go POV to follow up with his doctor but he became diaphoretic and hypotensive. BP came back up with EMS. Arrives A&O. Pt takes eliquis.

## 2020-02-01 NOTE — Anesthesia Preprocedure Evaluation (Addendum)
Anesthesia Evaluation  Patient identified by MRN, date of birth, ID band Patient awake    Reviewed: Allergy & Precautions, H&P , NPO status , Patient's Chart, lab work & pertinent test results  History of Anesthesia Complications (+) PONV, Family history of anesthesia reaction and history of anesthetic complications  Airway Mallampati: III  TM Distance: <3 FB Neck ROM: limited    Dental  (+) Edentulous Lower, Edentulous Upper   Pulmonary sleep apnea and Continuous Positive Airway Pressure Ventilation , neg COPD, former smoker,           Cardiovascular hypertension, (-) CAD, (-) Past MI, (-) Cardiac Stents and (-) CABG + dysrhythmias  - Systolic murmurs Hx afib, s/p ablation. Now in sinus   Neuro/Psych neg Seizures PSYCHIATRIC DISORDERS Anxiety  Neuromuscular disease negative neurological ROS  negative psych ROS   GI/Hepatic Neg liver ROS, GERD  Controlled and Medicated,  Endo/Other  diabetes, Type 2, Oral Hypoglycemic Agents  Renal/GU CRFRenal disease     Musculoskeletal  (+) Arthritis ,   Abdominal (+) + obese,   Peds  Hematology  (+) Blood dyscrasia, anemia ,   Anesthesia Other Findings Past Medical History: No date: Anemia     Comment:  H/O No date: Anxiety No date: Arthritis No date: Chronic kidney disease No date: Complication of anesthesia No date: CTCL (cutaneous T-cell lymphoma) (HCC) No date: Diabetes mellitus without complication (HCC) No date: Dysrhythmia     Comment:  A FLUTTER No date: Family history of adverse reaction to anesthesia     Comment:  PT WAS ADOPTED No date: HLD (hyperlipidemia) No date: HTN (hypertension) No date: HTN (hypertension) 2019: Hx of dysplastic nevus     Comment:  multiple sites 01/18/2018: Hx of squamous cell carcinoma     Comment:  R mid lateral forearm No date: MRSA (methicillin resistant Staphylococcus aureus)     Comment:  after back surgery No date: OSA  (obstructive sleep apnea)     Comment:  USES BIPAP No date: Polio 01/12/2010: POLIOMYELITIS     Comment:  Right arm affected No date: PONV (postoperative nausea and vomiting)  BMI    Body Mass Index: 37.12 kg/m      Reproductive/Obstetrics negative OB ROS                             Anesthesia Physical  Anesthesia Plan  ASA: III  Anesthesia Plan: General and General ETT   Post-op Pain Management:    Induction: Intravenous  PONV Risk Score and Plan: 3 and Ondansetron, Dexamethasone, Midazolam and Treatment may vary due to age or medical condition  Airway Management Planned: Oral ETT  Additional Equipment: None  Intra-op Plan:   Post-operative Plan: Extubation in OR  Informed Consent: I have reviewed the patients History and Physical, chart, labs and discussed the procedure including the risks, benefits and alternatives for the proposed anesthesia with the patient or authorized representative who has indicated his/her understanding and acceptance.     Dental advisory given  Plan Discussed with: CRNA and Surgeon  Anesthesia Plan Comments: (Patient consented for risks of anesthesia including but not limited to:  - adverse reactions to medications - damage to eyes, teeth, lips or other oral mucosa - nerve damage due to positioning  - sore throat or hoarseness - Damage to heart, brain, nerves, lungs, other parts of body or loss of life  Patient voiced understanding.)       Anesthesia Quick  Evaluation

## 2020-02-01 NOTE — H&P (Signed)
Daniel Kline is an 74 y.o. male.     Chief Complaint: persistent bleeding right knee s/p right total knee arthroplasty  HPI: The patient is a 74 year old male who underwent right total knee arthroplasty 4 days ago. His initial postoperative course was relatively uneventful. However, he completely saturated the dressing after removal of the hemovac drains. He was evaluated in the ER yesterday at which time a WoundVac was applied with minimal output noted before discharge. His wife notified the office today of persistent bleeding.  Past Medical History:  Diagnosis Date  . Anemia    H/O  . Anxiety   . Arthritis   . Chronic kidney disease   . Complication of anesthesia   . 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 Diabetes mellitus without complication ([place])   . Dysrhythmia    A FLUTTER  . Family history of adverse reaction to anesthesia    PT WAS ADOPTED  . HLD (hyperlipidemia)   . HTN (hypertension)   . HTN (hypertension)   . Hx of dysplastic nevus 2019   multiple sites  . 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 MRSA (methicillin resistant Staphylococcus aureus)    after back surgery  . OSA (obstructive sleep apnea)    USES BIPAP  . Polio   . 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 PONV (postoperative nausea and vomiting)     Past Surgical History:  Procedure Laterality Date  . BACK SURGERY     LUMBAR  . CARDIAC ELECTROPHYSIOLOGY STUDY AND ABLATION  2019  . COLONOSCOPY WITH PROPOFOL N/A 04/04/2019   Procedure: COLONOSCOPY WITH PROPOFOL;  Surgeon: Toledo, Benay Pike, MD;  Location: [facility] ENDOSCOPY;  Service: Gastroenterology;  Laterality: N/A;  . INCISION AND DRAINAGE     BACK-MRSA INFECTION AFTER BACK SURGERY  . KNEE ARTHROPLASTY Right 01/28/2020   Procedure: COMPUTER ASSISTED TOTAL KNEE ARTHROPLASTY;  Surgeon: Dereck Leep, MD;  Location: [facility] ORS;  Service: Orthopedics;  Laterality: Right;  . MOUTH SURGERY    . right elbow  surgery    . right knee surgery    . right shoulder surgery     from polio damage  . TONSILLECTOMY    . TOTAL HIP ARTHROPLASTY Bilateral 04/2016    Family History  Adopted: Yes   Social History:  reports that he quit smoking about 29 years ago. His smoking use included cigars. He quit smokeless tobacco use about 13 years ago. He reports that he does not drink alcohol and does not use drugs.  Allergies:  Allergies  Allergen Reactions  . Bee Venom Anaphylaxis  . Oxycodone Other (See Comments)    Delusions  . Hydromorphone Other (See Comments)    hallucinating  . Zolpidem Other (See Comments)    No medications prior to admission.    Results for orders placed or performed during the hospital encounter of 01/31/20 (from the past 48 hour(s))  CBC with Differential     Status: Abnormal   Collection Time: 01/31/20 11:28 AM  Result Value Ref Range   WBC 8.0 4.0 - 10.5 K/uL   RBC 3.64 (L) 4.22 - 5.81 MIL/uL   Hemoglobin 10.3 (L) 13.0 - 17.0 g/dL   HCT 30.3 (L) 39 - 52 %   MCV 83.2 80.0 - 100.0 fL   MCH 28.3 26.0 - 34.0 pg   MCHC 34.0 30.0 - 36.0 g/dL   RDW 14.6 11.5 - 15.5 %  Platelets 183 150 - 400 K/uL   nRBC 0.0 0.0 - 0.2 %   Neutrophils Relative % 70 %   Neutro Abs 5.6 1.7 - 7.7 K/uL   Lymphocytes Relative 17 %   Lymphs Abs 1.4 0.7 - 4.0 K/uL   Monocytes Relative 10 %   Monocytes Absolute 0.8 0 - 1 K/uL   Eosinophils Relative 1 %   Eosinophils Absolute 0.1 0 - 0 K/uL   Basophils Relative 1 %   Basophils Absolute 0.1 0 - 0 K/uL   Immature Granulocytes 1 %   Abs Immature Granulocytes 0.04 0.00 - 0.07 K/uL    Comment: Performed at [facility], [address] [place], [place] [address]  Protime-INR     Status: None   Collection Time: 01/31/20 11:28 AM  Result Value Ref Range   Prothrombin Time 12.7 11.4 - 15.2 seconds   INR 1.0 0.8 - 1.2    Comment: (NOTE) INR goal varies based on device and disease states. Performed at [facility], [address] [place], [place] [address]   Basic metabolic panel     Status: Abnormal   Collection Time: 01/31/20 11:28 AM  Result Value Ref Range   Sodium 136 135 - 145 mmol/L   Potassium 4.2 3.5 - 5.1 mmol/L   Chloride 101 98 - 111 mmol/L   CO2 27 22 - 32 mmol/L   Glucose, Bld 234 (H) 70 - 99 mg/dL    Comment: Glucose reference range applies only to samples taken after fasting for at least 8 hours.   BUN 28 (H) 8 - 23 mg/dL   Creatinine, Ser 1.63 (H) 0.61 - 1.24 mg/dL   Calcium 8.7 (L) 8.9 - 10.3 mg/dL   GFR calc non Af Amer 41 (L) >60 mL/min   GFR calc Af Amer 47 (L) >60 mL/min   Anion gap 8 5 - 15    Comment: Performed at [facility], [address] [place], [place] [address]  Type and screen [place]     Status: None   Collection Time: 01/31/20 11:35 AM  Result Value Ref Range   ABO/RH(D) O POS    Antibody Screen NEG    Sample Expiration      02/03/2020,2359 Performed at [facility], [address] [place], [place] [address]    No results found.  Review of Systems  There were no vitals taken for this visit. Physical Exam  Patient is a well-developed, well-nourished male in no acute distress. Patient has normal mood and affect. Patient is alert and oriented to person, place, and time.   HEENT: Atraumatic, normocephalic. Pupils equal and reactive to light. Extraocular motion intact. Noninjected sclera.  Cardiovascular: Regular rate and rhythm, with no murmurs, rubs, or gallops. Distal pulses palpable.  Respiratory: Lungs clear to auscultation bilaterally.   Right Knee: Soft tissue swelling: moderate Effusion: none Erythema: none Tenderness: medial Alignment: normal Mediolateral laxity: stable Posterior sag: negative Patellar tracking: Good tracking without evidence of subluxation or tilt Atrophy: No significant atrophy.  Quadriceps tone was good. Surgical incision is well approximated with staples in place.  Persistent slow drainge from upper portion of incision.   Sensation intact over the saphenous, lateral sural cutaneous, superficial fibular, and deep fibular nerve distributions.   Assessment/Plan Persistent bleeding s/p right total knee arthroplasty   Recommend exploration of the right knee wound and address any active bleeding. Anticipate irrigation of the wound and possible polyethylene exchange.  Laurice Record  Iris Tatsch, MD 02/01/2020, 12:30 PM

## 2020-02-01 NOTE — Transfer of Care (Signed)
Immediate Anesthesia Transfer of Care Note  Patient: Daniel Kline  Procedure(s) Performed: IRRIGATION AND DEBRIDEMENT EXTREMITY with poly exchange (Right Knee)  Patient Location: PACU  Anesthesia Type:General  Level of Consciousness: sedated  Airway & Oxygen Therapy: Patient Spontanous Breathing and Patient connected to face mask oxygen  Post-op Assessment: Report given to RN and Post -op Vital signs reviewed and stable  Post vital signs: Reviewed  Last Vitals:  Vitals Value Taken Time  BP    Temp    Pulse 56 02/01/20 2109  Resp 15 02/01/20 2109  SpO2 100 % 02/01/20 2109  Vitals shown include unvalidated device data.  Last Pain:  Vitals:   02/01/20 1259  TempSrc: Oral  PainSc:          Complications: No complications documented.

## 2020-02-01 NOTE — H&P (Deleted)
  The note originally documented on this encounter has been moved the the encounter in which it belongs.  

## 2020-02-01 NOTE — Anesthesia Procedure Notes (Signed)
Procedure Name: Intubation Performed by: Rolla Plate, CRNA Pre-anesthesia Checklist: Patient identified, Patient being monitored, Timeout performed, Emergency Drugs available and Suction available Patient Re-evaluated:Patient Re-evaluated prior to induction Oxygen Delivery Method: Circle system utilized Preoxygenation: Pre-oxygenation with 100% oxygen Induction Type: IV induction and Rapid sequence Laryngoscope Size: McGraph and 4 Grade View: Grade I Tube type: Oral Tube size: 7.0 mm Number of attempts: 1 Airway Equipment and Method: Stylet and Video-laryngoscopy Placement Confirmation: ETT inserted through vocal cords under direct vision,  positive ETCO2 and breath sounds checked- equal and bilateral Secured at: 21 cm Tube secured with: Tape Dental Injury: Teeth and Oropharynx as per pre-operative assessment

## 2020-02-02 DIAGNOSIS — I4892 Unspecified atrial flutter: Secondary | ICD-10-CM | POA: Diagnosis present

## 2020-02-02 DIAGNOSIS — Z20822 Contact with and (suspected) exposure to covid-19: Secondary | ICD-10-CM | POA: Diagnosis present

## 2020-02-02 DIAGNOSIS — N183 Chronic kidney disease, stage 3 unspecified: Secondary | ICD-10-CM | POA: Diagnosis present

## 2020-02-02 DIAGNOSIS — E785 Hyperlipidemia, unspecified: Secondary | ICD-10-CM | POA: Diagnosis present

## 2020-02-02 DIAGNOSIS — Z8614 Personal history of Methicillin resistant Staphylococcus aureus infection: Secondary | ICD-10-CM | POA: Diagnosis not present

## 2020-02-02 DIAGNOSIS — I129 Hypertensive chronic kidney disease with stage 1 through stage 4 chronic kidney disease, or unspecified chronic kidney disease: Secondary | ICD-10-CM | POA: Diagnosis present

## 2020-02-02 DIAGNOSIS — Z885 Allergy status to narcotic agent status: Secondary | ICD-10-CM | POA: Diagnosis not present

## 2020-02-02 DIAGNOSIS — Z791 Long term (current) use of non-steroidal anti-inflammatories (NSAID): Secondary | ICD-10-CM | POA: Diagnosis not present

## 2020-02-02 DIAGNOSIS — Z7984 Long term (current) use of oral hypoglycemic drugs: Secondary | ICD-10-CM | POA: Diagnosis not present

## 2020-02-02 DIAGNOSIS — Z96643 Presence of artificial hip joint, bilateral: Secondary | ICD-10-CM | POA: Diagnosis present

## 2020-02-02 DIAGNOSIS — Z79899 Other long term (current) drug therapy: Secondary | ICD-10-CM | POA: Diagnosis not present

## 2020-02-02 DIAGNOSIS — Y831 Surgical operation with implant of artificial internal device as the cause of abnormal reaction of the patient, or of later complication, without mention of misadventure at the time of the procedure: Secondary | ICD-10-CM | POA: Diagnosis present

## 2020-02-02 DIAGNOSIS — D62 Acute posthemorrhagic anemia: Secondary | ICD-10-CM | POA: Diagnosis not present

## 2020-02-02 DIAGNOSIS — E1122 Type 2 diabetes mellitus with diabetic chronic kidney disease: Secondary | ICD-10-CM | POA: Diagnosis present

## 2020-02-02 DIAGNOSIS — Z8612 Personal history of poliomyelitis: Secondary | ICD-10-CM | POA: Diagnosis not present

## 2020-02-02 DIAGNOSIS — Z87891 Personal history of nicotine dependence: Secondary | ICD-10-CM | POA: Diagnosis not present

## 2020-02-02 DIAGNOSIS — F419 Anxiety disorder, unspecified: Secondary | ICD-10-CM | POA: Diagnosis present

## 2020-02-02 DIAGNOSIS — Z6841 Body Mass Index (BMI) 40.0 and over, adult: Secondary | ICD-10-CM | POA: Diagnosis not present

## 2020-02-02 DIAGNOSIS — Z96651 Presence of right artificial knee joint: Secondary | ICD-10-CM | POA: Diagnosis present

## 2020-02-02 DIAGNOSIS — M96840 Postprocedural hematoma of a musculoskeletal structure following a musculoskeletal system procedure: Secondary | ICD-10-CM | POA: Diagnosis present

## 2020-02-02 LAB — GLUCOSE, CAPILLARY
Glucose-Capillary: 278 mg/dL — ABNORMAL HIGH (ref 70–99)
Glucose-Capillary: 223 mg/dL — ABNORMAL HIGH (ref 70–99)
Glucose-Capillary: 185 mg/dL — ABNORMAL HIGH (ref 70–99)

## 2020-02-02 LAB — HEMOGLOBIN AND HEMATOCRIT, BLOOD
HCT: 20.9 % — ABNORMAL LOW (ref 39.0–52.0)
Hemoglobin: 6.9 g/dL — ABNORMAL LOW (ref 13.0–17.0)

## 2020-02-02 LAB — PREPARE RBC (CROSSMATCH)

## 2020-02-02 MED ORDER — SODIUM CHLORIDE 0.9% IV SOLUTION: Freq: Once | INTRAVENOUS | Status: AC

## 2020-02-02 NOTE — Evaluation (Signed)
Occupational Therapy Evaluation Patient Details Name: Daniel Kline MRN: [id number] DOB: 19-Aug-1945 Today's Date: 02/02/2020    History of Present Illness 74 year old male who underwent right total knee arthroplasty 6/21. His initial postoperative course was relatively uneventful. However, he completely saturated the dressing, symptomatic and came back needing poly exchange 6/25.  Blood levels remain low at time of OT exam.   Clinical Impression   Pt seen for OT evaluation this date. Pt returned to hospital after recent TKA requiring poly exchange on 6/25. Brief review of polar care mgt, compression stocking mgt, and home/routines modifications for ADL tasks; pt reports no difficulties and spouse able to assist as needed when PRN assist required. Pt educated in modified strategies to support wife in donning compression stockings 2/2 low back pain concerns to better support the pt. Pt verbalized understanding. No additional skilled acute OT needs at this time. Recommend HHOT services to facilitate optimal return to PLOF.    Follow Up Recommendations  Home health OT;Supervision - Intermittent    Equipment Recommendations  3 in 1 bedside commode    Recommendations for Other Services       Precautions / Restrictions Precautions Precautions: Fall Restrictions Weight Bearing Restrictions: Yes RLE Weight Bearing: Weight bearing as tolerated      Mobility Bed Mobility Overal bed mobility: Modified Independent             General bed mobility comments: deferred, up in recliner  Transfers Overall transfer level: Needs assistance Equipment used: Right platform walker Transfers: Sit to/from Stand Sit to Stand: Min guard         General transfer comment: pt able to rise to standing from standard height surface w/o assist    Balance Overall balance assessment: Needs assistance Sitting-balance support: No upper extremity supported;Feet supported Sitting balance-Leahy Scale:  Good     Standing balance support: Bilateral upper extremity supported;During functional activity Standing balance-Leahy Scale: Good                             ADL either performed or assessed with clinical judgement   ADL Overall ADL's : Needs assistance/impaired                                       General ADL Comments: Mod I w/ UB ADL (at baseline); CGA for ADL transfers using platform walker; PRN Min A for LB ADL including donning/doffing compression stocking on surgical leg = pt reports spouse can provide needed level of assist     Vision Patient Visual Report: No change from baseline       Perception     Praxis      Pertinent Vitals/Pain Pain Assessment: 0-10 Pain Score: 2  Pain Location: R knee Pain Descriptors / Indicators: Sore;Discomfort Pain Intervention(s): Limited activity within patient's tolerance;Monitored during session;Premedicated before session;Repositioned     Hand Dominance Left (h/o polio impacting R UE)   Extremity/Trunk Assessment Upper Extremity Assessment Upper Extremity Assessment: RUE deficits/detail RUE Deficits / Details: Pt with h/o Polio impacting ROM, shld flexion to 1/2 range, keeps elbow in flexed positiong primarily. MMT 3-/5 LUE Deficits / Details: ROM and strength WFL   Lower Extremity Assessment Lower Extremity Assessment: RLE deficits/detail RLE Deficits / Details: expected post-op weakness, able to do SLRs confidently with light warm up   Cervical / Trunk Assessment Cervical /  Trunk Assessment: Normal   Communication Communication Communication: No difficulties   Cognition Arousal/Alertness: Awake/alert Behavior During Therapy: WFL for tasks assessed/performed Overall Cognitive Status: Within Functional Limits for tasks assessed                                     General Comments       Exercises Exercises: Total Joint Total Joint Exercises Ankle Circles/Pumps: AROM;10  reps Quad Sets: Strengthening;10 reps Short Arc Quad: AROM;Strengthening;10 reps Heel Slides: AROM;Strengthening;10 reps (resisted leg extensions) Hip ABduction/ADduction: Strengthening;10 reps Straight Leg Raises: AROM;10 reps Knee Flexion: PROM;5 reps Goniometric ROM: 0-90 Other Exercises Other Exercises: Brief review of polar care mgt, compression stocking mgt, and home/routines modifications for ADL tasks; pt reports no difficulties and spouse able to assist as needed when PRN assist required. Pt educated in modified strategies to support wife in donning compression stockings 2/2 low back pain concerns to better support the pt   Shoulder Instructions      Home Living Family/patient expects to be discharged to:: Private residence Living Arrangements: Spouse/significant other Available Help at Discharge: Family;Available 24 hours/day Type of Home: House Home Access: Stairs to enter [employer] of Steps: 4 Entrance Stairs-Rails: Can reach both Home Layout: One level     Bathroom Shower/Tub: Occupational psychologist: Standard Bathroom Accessibility: Yes   Home Equipment: Environmental consultant - 2 wheels;Crutches;Shower seat - built in (has R platform)          Prior Functioning/Environment Level of Independence: Independent with assistive device(s)        Comments: Pt reports active lifestyle. Typically able to do all self care and all IADLs I'ly as well, ex: mowing the lawn with a push mower.        OT Problem List: Decreased strength;Decreased range of motion;Decreased activity tolerance;Impaired balance (sitting and/or standing);Decreased knowledge of use of DME or AE      OT Treatment/Interventions:      OT Goals(Current goals can be found in the care plan section) Acute Rehab OT Goals Patient Stated Goal: go home and recover OT Goal Formulation: All assessment and education complete, DC therapy  OT Frequency:     Barriers to D/C:             Co-evaluation              AM-PAC OT "6 Clicks" Daily Activity     Outcome Measure Help from another person eating meals?: None Help from another person taking care of personal grooming?: None Help from another person toileting, which includes using toliet, bedpan, or urinal?: A Little Help from another person bathing (including washing, rinsing, drying)?: A Little Help from another person to put on and taking off regular upper body clothing?: None Help from another person to put on and taking off regular lower body clothing?: A Little 6 Click Score: 21   End of Session    Activity Tolerance: Patient tolerated treatment well Patient left: in chair;with call bell/phone within reach;with chair alarm set;with family/visitor present;with nursing/sitter in room  OT Visit Diagnosis: Unsteadiness on feet (R26.81);Other abnormalities of gait and mobility (R26.89);Muscle weakness (generalized) (M62.81)                Time: 8657-8469 OT Time Calculation (min): 9 min Charges:  OT General Charges $OT Visit: 1 Visit OT Evaluation $OT Eval Low Complexity: 1 Low  Jeni Salles, MPH, MS, OTR/L  ascom [phone] 02/02/20, 1:04 PM

## 2020-02-02 NOTE — Progress Notes (Signed)
Subjective: 1 Day Post-Op Procedure(s) (LRB): IRRIGATION AND DEBRIDEMENT EXTREMITY with poly exchange (Right) Patient reports pain as mild.   Patient is well, and has had no acute complaints or problems Plan is to go Home after hospital stay. Negative for chest pain and shortness of breath Fever: no Gastrointestinal:Negative for nausea and vomiting  Objective: Vital signs in last 24 hours: Temp:  [97.5 F (36.4 C)-99 F (37.2 C)] 97.5 F (36.4 C) (06/26 0229) Pulse Rate:  [58-78] 65 (06/26 0229) Resp:  [12-18] 18 (06/26 0229) BP: (110-173)/(55-86) 135/65 (06/26 0600) SpO2:  [93 %-100 %] 98 % (06/26 0229) Weight:  [104.3 kg-113.3 kg] 113.3 kg (06/25 2246)  Intake/Output from previous day:  Intake/Output Summary (Last 24 hours) at 02/02/2020 0858 Last data filed at 02/02/2020 0303 Gross per 24 hour  Intake 2354.17 ml  Output 475 ml  Net 1879.17 ml    Intake/Output this shift: No intake/output data recorded.  Labs: Recent Labs    01/31/20 1128 02/01/20 1313  HGB 10.3* 7.9*   Recent Labs    01/31/20 1128 02/01/20 1313  WBC 8.0 9.3  RBC 3.64* 2.75*  HCT 30.3* 23.1*  PLT 183 166   Recent Labs    01/31/20 1128 02/01/20 1313  NA 136 134*  K 4.2 4.1  CL 101 100  CO2 27 28  BUN 28* 24*  CREATININE 1.63* 1.77*  GLUCOSE 234* 280*  CALCIUM 8.7* 8.2*   Recent Labs    01/31/20 1128  INR 1.0     EXAM General - Patient is Alert, Appropriate and Oriented Extremity - ABD soft Sensation intact distally Intact pulses distally Dorsiflexion/Plantar flexion intact Dressing/Incision - Bulky dressing intact to the right leg, minimal bloody drainage noted to the right knee woundvac. Motor Function - intact, moving foot and toes well on exam.  Abdomen is soft with normal bowel sounds.  Past Medical History:  Diagnosis Date  . Anemia    H/O  . Anxiety   . Arthritis   . Chronic kidney disease   . Complication of anesthesia   . CTCL (cutaneous T-cell lymphoma)  (HCC)   . Diabetes mellitus without complication ([place])   . Dysrhythmia    A FLUTTER  . Family history of adverse reaction to anesthesia    PT WAS ADOPTED  . HLD (hyperlipidemia)   . HTN (hypertension)   . HTN (hypertension)   . Hx of dysplastic nevus 2019   multiple sites  . 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 MRSA (methicillin resistant Staphylococcus aureus)    after back surgery  . OSA (obstructive sleep apnea)    USES BIPAP  . Polio   . 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 PONV (postoperative nausea and vomiting)     Assessment/Plan: 1 Day Post-Op Procedure(s) (LRB): IRRIGATION AND DEBRIDEMENT EXTREMITY with poly exchange (Right) Active Problems:   Hematoma of right knee region  Estimated body mass index is 40.32 kg/m as calculated from the following:   Height as of this encounter: 5\' 6"  (1.676 m).   Weight as of this encounter: 113.3 kg. Advance diet Up with therapy D/C IV fluids when tolerating po intake.  Labs pending this AM. Up with therapy today. Will remove bulky dressing tomorrow. Plan for discharge home tomorrow with woundvac, follow-up in the office during the week for skin check.  DVT Prophylaxis - Lovenox, Foot Pumps and TED hose Weight-Bearing as tolerated to right leg  J. Mia Creek  Barton Dubois [facility] Orthopaedic Surgery 02/02/2020, 8:58 AM

## 2020-02-02 NOTE — Progress Notes (Signed)
Physical Therapy Treatment Patient Details Name: Daniel Kline MRN: [id number] DOB: 01-27-1946 Today's Date: 02/02/2020    History of Present Illness 74 year old male s/p right total knee arthroplasty 6/21. His initial postoperative course was relatively uneventful. However, he completely saturated the dressing, symptomatic and came back needing poly exchange 6/25.  Blood levels remain low at time of OT exam.    PT Comments    Pt receiving that last bit of blood transfusion but still very eager to do all he can with PT.  Deferred mobility secondary to transfusion and focused on supine exercises.  Pt with more pain and less energy than this AM but showed consistently great effort with all tasks. Even after a good bit of warm up he struggled with against gravity tasks and did need AAROM for SLRs and even some initial SAQ.  Pt very eager to do a longer walk tomorrow and revisit the steps.  Pt with great attitude and effort, but did struggle with some of the exercises this afternoon.     Follow Up Recommendations  Home health PT     Equipment Recommendations  None recommended by PT    Recommendations for Other Services       Precautions / Restrictions Precautions Precautions: Fall Restrictions RLE Weight Bearing: Weight bearing as tolerated    Mobility  Bed Mobility               General bed mobility comments: deferred mobility secondary to blood transfusion  Transfers                    Ambulation/Gait                 Stairs             Wheelchair Mobility    Modified Rankin (Stroke Patients Only)       Balance                                            Cognition Arousal/Alertness: Awake/alert Behavior During Therapy: WFL for tasks assessed/performed Overall Cognitive Status: Within Functional Limits for tasks assessed                                        Exercises Total Joint Exercises Quad  Sets: Strengthening;10 reps Short Arc Quad: AAROM;AROM;10 reps Heel Slides: AROM;5 reps (resisted leg extensions) Hip ABduction/ADduction: Strengthening;10 reps Straight Leg Raises: AAROM;5 reps Knee Flexion: PROM;5 reps Goniometric ROM: 0-94    General Comments        Pertinent Vitals/Pain Pain Assessment: 0-10 Pain Score: 5  Pain Location: R knee    Home Living                      Prior Function            PT Goals (current goals can now be found in the care plan section) Progress towards PT goals: PT to reassess next treatment    Frequency    BID      PT Plan Current plan remains appropriate    Co-evaluation              AM-PAC PT "6 Clicks" Mobility   Outcome Measure  Help needed turning from your  back to your side while in a flat bed without using bedrails?: None Help needed moving from lying on your back to sitting on the side of a flat bed without using bedrails?: None Help needed moving to and from a bed to a chair (including a wheelchair)?: A Little Help needed standing up from a chair using your arms (e.g., wheelchair or bedside chair)?: A Little Help needed to walk in hospital room?: A Little Help needed climbing 3-5 steps with a railing? : A Lot 6 Click Score: 19    End of Session   Activity Tolerance: Patient tolerated treatment well Patient left: with bed alarm set;with nursing/sitter in room;with call bell/phone within reach Nurse Communication: Mobility status (cryotherapy benefit) PT Visit Diagnosis: Unsteadiness on feet (R26.81);Muscle weakness (generalized) (M62.81);Pain Pain - Right/Left: Right Pain - part of body: Knee     Time: 7841-2820 PT Time Calculation (min) (ACUTE ONLY): 24 min  Charges:  $Therapeutic Exercise: 23-37 mins                     Kreg Shropshire, DPT 02/02/2020, 5:55 PM

## 2020-02-02 NOTE — Evaluation (Signed)
Physical Therapy Evaluation Patient Details Name: Daniel Kline MRN: [id number] DOB: 02-06-1946 Today's Date: 02/02/2020   History of Present Illness   74 year old male who underwent right total knee arthroplasty 6/21. His initial postoperative course was relatively uneventful. However, he completely saturated the dressing, symptomatic and came back needing poly exchange 6/25.  Blood levels remain low at time of PT exam.  Clinical Impression  Pt did well with PT exam, very motivated and showed good effort t/o.  He does not endorse a lot of pain and was able to do most mobility w/o assist or need for much cuing, minimal warm up time with supine exercises before confidently doing resisted and against gravity tasks and overall showing good tolerance, safety and confidence with bout of gait training in to the hallway.      Follow Up Recommendations Home health PT    Equipment Recommendations  None recommended by PT    Recommendations for Other Services       Precautions / Restrictions Precautions Precautions: Fall Restrictions RLE Weight Bearing: Weight bearing as tolerated      Mobility  Bed Mobility Overal bed mobility: Modified Independent             General bed mobility comments: Pt did well getting to EOB w/o assist  Transfers Overall transfer level: Needs assistance Equipment used: Right platform walker Transfers: Sit to/from Stand Sit to Stand: Min guard         General transfer comment: pt able to rise to standing from standard height surface w/o assist  Ambulation/Gait Ambulation/Gait assistance: Min guard Gait Distance (Feet): 80 Feet Assistive device: Right platform walker       General Gait Details: Pt did well with ambulation showing ability to maintain constant cadence/speed and did not show real hesitation or WBing limitations with the effort  Stairs            Wheelchair Mobility    Modified Rankin (Stroke Patients Only)        Balance Overall balance assessment: Needs assistance Sitting-balance support: No upper extremity supported;Feet supported Sitting balance-Leahy Scale: Good     Standing balance support: Bilateral upper extremity supported;During functional activity Standing balance-Leahy Scale: Good                               Pertinent Vitals/Pain Pain Assessment: 0-10 Pain Score: 4  Pain Location: R knee    Home Living Family/patient expects to be discharged to:: Private residence Living Arrangements: Spouse/significant other Available Help at Discharge: Family;Available 24 hours/day Type of Home: House Home Access: Stairs to enter Entrance Stairs-Rails: Can reach both Entrance Stairs-Number of Steps: 4 Home Layout: One level Home Equipment: Walker - 2 wheels;Crutches;Shower seat - built in (has R platform)      Prior Function Level of Independence: Independent with assistive device(s)         Comments: Pt reports active lifestyle. Typically able to do all self care and all IADLs I'ly as well, ex: mowing the lawn with a push mower.     Hand Dominance        Extremity/Trunk Assessment   Upper Extremity Assessment RUE Deficits / Details: Pt with h/o Polio impacting ROM, shld flexion to 1/2 range, keeps elbow in flexed positiong primarily. MMT 3-/5 LUE Deficits / Details: ROM and strength WFL    Lower Extremity Assessment Lower Extremity Assessment: Overall WFL for tasks assessed RLE Deficits / Details:  expected post-op weakness, able to do SLRs confidently with light warm up       Communication   Communication: No difficulties  Cognition Arousal/Alertness: Awake/alert Behavior During Therapy: WFL for tasks assessed/performed Overall Cognitive Status: Within Functional Limits for tasks assessed                                        General Comments      Exercises Total Joint Exercises Ankle Circles/Pumps: AROM;10 reps Quad Sets:  Strengthening;10 reps Short Arc Quad: AROM;Strengthening;10 reps Heel Slides: AROM;Strengthening;10 reps (resisted leg extensions) Hip ABduction/ADduction: Strengthening;10 reps Straight Leg Raises: AROM;10 reps Knee Flexion: PROM;5 reps Goniometric ROM: 0-90   Assessment/Plan    PT Assessment Patient needs continued PT services  PT Problem List Decreased strength;Decreased range of motion;Decreased balance;Decreased mobility;Decreased activity tolerance;Pain       PT Treatment Interventions DME instruction;Gait training;Stair training;Functional mobility training;Therapeutic activities;Therapeutic exercise;Balance training;Patient/family education    PT Goals (Current goals can be found in the Care Plan section)  Acute Rehab PT Goals Patient Stated Goal: to go home PT Goal Formulation: With patient Time For Goal Achievement: 02/16/20 Potential to Achieve Goals: Good    Frequency BID   Barriers to discharge        Co-evaluation               AM-PAC PT "6 Clicks" Mobility  Outcome Measure Help needed turning from your back to your side while in a flat bed without using bedrails?: None Help needed moving from lying on your back to sitting on the side of a flat bed without using bedrails?: None Help needed moving to and from a bed to a chair (including a wheelchair)?: A Little Help needed standing up from a chair using your arms (e.g., wheelchair or bedside chair)?: A Little Help needed to walk in hospital room?: A Little Help needed climbing 3-5 steps with a railing? : A Lot 6 Click Score: 19    End of Session Equipment Utilized During Treatment: Gait belt Activity Tolerance: Patient tolerated treatment well Patient left: with call bell/phone within reach;with chair alarm set Nurse Communication: Mobility status PT Visit Diagnosis: Unsteadiness on feet (R26.81);Muscle weakness (generalized) (M62.81);Pain Pain - Right/Left: Right Pain - part of body: Knee     Time: 8828-0034 PT Time Calculation (min) (ACUTE ONLY): 43 min   Charges:   PT Evaluation $PT Eval Low Complexity: 1 Low PT Treatments $Gait Training: 8-22 mins $Therapeutic Exercise: 8-22 mins        Kreg Shropshire, DPT 02/02/2020, 11:29 AM

## 2020-02-02 NOTE — Plan of Care (Signed)
Patient admitted to the floor for an I & D of right knee, due to excessive bleeding. Patient is room air, alert and oriented x 4. Denies pain. Denies nausea and vomiting.Skin clean, dry, and intact.

## 2020-02-02 NOTE — Progress Notes (Signed)
ORTHOPAEDICS PROGRESS NOTE  PATIENT NAME: Daniel Kline DOB: 11/05/1945  MRN: [id number]  POD # 1: Evacuation of right knee hematoma (POD #5 right total knee arthroplasty)  Subjective: Patient was up in chair this AM. He denied any nausea or vomiting. He denied any dizziness or shortness of breath.  Objective: Vital signs in last 24 hours: Temp:  [97.5 F (36.4 C)-99 F (37.2 C)] 98.1 F (36.7 C) (06/26 1042) Pulse Rate:  [58-78] 66 (06/26 1042) Resp:  [12-18] 17 (06/26 1042) BP: (110-173)/(55-86) 165/59 (06/26 1042) SpO2:  [93 %-100 %] 100 % (06/26 1042) Weight:  [104.3 kg-113.3 kg] 113.3 kg (06/25 2246)  Intake/Output from previous day: 06/25 0701 - 06/26 0700 In: 2354.2 [P.O.:420; I.V.:1634.2; IV Piggyback:300] Out: 475 [Urine:450; Blood:25]  Recent Labs    01/31/20 1128 02/01/20 1313 02/02/20 1019  WBC 8.0 9.3  --   HGB 10.3* 7.9* 6.9*  HCT 30.3* 23.1* 20.9*  PLT 183 166  --   K 4.2 4.1  --   CL 101 100  --   CO2 27 28  --   BUN 28* 24*  --   CREATININE 1.63* 1.77*  --   GLUCOSE 234* 280*  --   CALCIUM 8.7* 8.2*  --   INR 1.0  --   --     EXAM General: Well developed, well nourished male in no apparent discomfort Right lower extremity: Dressing dry and intact. Wound VAC in place and functioning. No drainage in the reserrvoir. Homan's test is negative. He is able to perform an independent straight leg-raise. Neurologic: Awake, alert, and oriented. Sensory and motor function are grossly intact.   Assessment: Evacuation of right knee hematoma Right total knee arthroplasty  Acute blood loss anemia superimposed on chronic anemia  Secondary diagnoses: Polio Sleep apnea Hypertension Dysrhythmia Diabetes Chronic kidney disease Anxiety Anemia  Plan: Blood glucose has been running high. Continue home medications and sliding scale insulin. Continue to monitor sugars. Hemoglobin has trended down. Given the patient's co-morbidities, I will transfuse 1  unit of pRBC and recheck hemoglobin. No evidence of active bleeding at this time. (NOTE: Labs ordered for 5 am were not drawn until 10 am) Will continue to monitor vitals. Continue physical therapy as per total knee arthroplasty rehab protocol. Plan is to go Home after hospital stay. DVT Prophylaxis - Lovenox, Foot Pumps and TED hose  Daniel Kline P. Holley Bouche M.D.

## 2020-02-02 NOTE — Progress Notes (Signed)
FBS 229

## 2020-02-03 ENCOUNTER — Encounter: Payer: Self-pay | Admitting: Orthopedic Surgery

## 2020-02-03 LAB — GLUCOSE, CAPILLARY
Glucose-Capillary: 229 mg/dL — ABNORMAL HIGH (ref 70–99)
Glucose-Capillary: 213 mg/dL — ABNORMAL HIGH (ref 70–99)
Glucose-Capillary: 224 mg/dL — ABNORMAL HIGH (ref 70–99)

## 2020-02-03 LAB — BPAM RBC
Blood Product Expiration Date: 202107272359
ISSUE DATE / TIME: 202106261326
Unit Type and Rh: 5100

## 2020-02-03 LAB — TYPE AND SCREEN
ABO/RH(D): O POS
Antibody Screen: NEGATIVE
Unit division: 0

## 2020-02-03 LAB — CBC
HCT: 20.6 % — ABNORMAL LOW (ref 39.0–52.0)
Hemoglobin: 6.9 g/dL — ABNORMAL LOW (ref 13.0–17.0)
MCH: 29.1 pg (ref 26.0–34.0)
MCHC: 33.5 g/dL (ref 30.0–36.0)
MCV: 86.9 fL (ref 80.0–100.0)
Platelets: 169 10*3/uL (ref 150–400)
RBC: 2.37 MIL/uL — ABNORMAL LOW (ref 4.22–5.81)
RDW: 14.8 % (ref 11.5–15.5)
WBC: 6.6 10*3/uL (ref 4.0–10.5)
nRBC: 0 % (ref 0.0–0.2)

## 2020-02-03 LAB — HEMOGLOBIN: Hemoglobin: 8 g/dL — ABNORMAL LOW (ref 13.0–17.0)

## 2020-02-03 MED ORDER — HYDROCODONE-ACETAMINOPHEN 5-325 MG PO TABS: 1.0000 | ORAL_TABLET | ORAL | 0 refills | Status: DC | PRN

## 2020-02-03 MED ORDER — CHLORHEXIDINE GLUCONATE CLOTH 2 % EX PADS: 6.0000 | MEDICATED_PAD | Freq: Every day | CUTANEOUS | Status: DC

## 2020-02-03 NOTE — Discharge Summary (Signed)
Physician Discharge Summary  Patient ID: Daniel Kline MRN: [id number] DOB/AGE: [birth year] 74 y.o.  Admit date: 02/01/2020 Discharge date: 02/03/2020  Admission Diagnoses:  Status post total right knee replacement [Z96.651] Hematoma of right knee region [S80.01XA]  Discharge Diagnoses: Patient Active Problem List   Diagnosis Date Noted  . Hematoma of right knee region 02/01/2020  . Total knee replacement status 01/28/2020  . Encounter for long-term (current) use of high-risk medication 06/14/2019  . Seronegative rheumatoid arthritis ([place]) 06/14/2019  . Chronic narcotic dependence ([place]) 05/18/2019  . Anti-cyclic citrullinated peptide antibody positive 05/03/2019  . Synovitis of hand 05/03/2019  . Radial neuropathy, left 09/06/2018  . Type 2 diabetes mellitus, uncontrolled, with renal complications ([place]) 40/98/1191  . Chronic bilateral low back pain without sciatica 06/09/2018  . Knee pain, chronic 04/12/2018  . OSA (obstructive sleep apnea) 09/07/2017  . History of poliomyelitis 08/27/2017  . Localized edema 08/12/2017  . Actinic keratosis 07/26/2017  . Cutaneous horn 07/26/2017  . History of total hip arthroplasty, bilateral 06/09/2016  . GERD (gastroesophageal reflux disease) 04/21/2016  . PONV (postoperative nausea and vomiting) 04/21/2016  . Skin cancer 04/21/2016  . Asymptomatic gallstones 01/06/2016  . Atrial flutter ([place]) 10/29/2015  . Venous stasis dermatitis of both lower extremities 10/29/2015  . Chronic kidney disease (CKD), stage III (moderate) ([place]) 09/27/2015  . Morbid obesity ([place]) 09/27/2015  . Primary osteoarthritis of right knee 08/26/2015  . 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 Hypercholesterolemia 12/20/2011  . Infection and inflammatory reaction due to internal prosthetic device, implant, and graft 12/20/2011  . Essential hypertension 01/12/2010    Past Medical History:  Diagnosis Date  . Anemia    H/O  . Anxiety   . Arthritis   .  Chronic kidney disease   . Complication of anesthesia   . 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 Diabetes mellitus without complication ([place])   . Dysrhythmia    A FLUTTER  . Family history of adverse reaction to anesthesia    PT WAS ADOPTED  . HLD (hyperlipidemia)   . HTN (hypertension)   . HTN (hypertension)   . Hx of dysplastic nevus 2019   multiple sites  . 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 MRSA (methicillin resistant Staphylococcus aureus)    after back surgery  . OSA (obstructive sleep apnea)    USES BIPAP  . Polio   . 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 PONV (postoperative nausea and vomiting)      Transfusion: 1 unit of PRBC on 02/02/20   Consultants (if any):   Discharged Condition: Improved  Hospital Course: Daniel Kline is an 74 y.o. male who was admitted 02/01/2020 with a diagnosis of persistent bleed from right knee incision status post right total knee arthoplasty and went to the operating room on 02/01/2020 and underwent the above named procedures.    Surgeries: Procedure(s): IRRIGATION AND DEBRIDEMENT EXTREMITY with poly exchange on 02/01/2020 Patient tolerated the surgery well. Taken to PACU where she was stabilized and then transferred to the orthopedic floor.  Continued on Lovenox 30mg  every 12 hours. Foot pumps applied bilaterally at 80 mm. Heels elevated on bed with rolled towels. No evidence of DVT. Negative Homan. Physical therapy started on day #1 for gait training and transfer. OT started day #1 for ADL and assisted devices.  Patient's IV was removed on POD2.  Patient was discharged with Prevena woundvac intact.  Implants: DePuy Attune  size 5 12 mm stabilized rotating platform polyethylene insert.  He was given perioperative antibiotics:  Anti-infectives (From admission, onward)   Start     Dose/Rate Route Frequency Ordered Stop   02/02/20 0600  ceFAZolin (ANCEF) IVPB 2g/100 mL premix        2  g 200 mL/hr over 30 Minutes Intravenous On call to O.R. 02/01/20 1704 02/01/20 1906   02/02/20 0200  ceFAZolin (ANCEF) IVPB 2g/100 mL premix        2 g 200 mL/hr over 30 Minutes Intravenous Every 6 hours 02/01/20 2210 02/02/20 2055   02/01/20 1705  ceFAZolin (ANCEF) 2-4 GM/100ML-% IVPB       Note to Pharmacy: Lyman Bishop   : cabinet override      02/01/20 1705 02/01/20 1837    .  He was given sequential compression devices, early ambulation, and Lovenox for DVT prophylaxis.  He benefited maximally from the hospital stay and there were no complications.    Recent vital signs:  Vitals:   02/02/20 2356 02/03/20 0803  BP: 130/66 (!) 153/74  Pulse: (!) 59 (!) 56  Resp: 16 17  Temp: 97.9 F (36.6 C) (!) 97.5 F (36.4 C)  SpO2: 97% 98%    Recent laboratory studies:  Lab Results  Component Value Date   HGB 6.9 (L) 02/03/2020   HGB 6.9 (L) 02/02/2020   HGB 7.9 (L) 02/01/2020   Lab Results  Component Value Date   WBC 6.6 02/03/2020   PLT 169 02/03/2020   Lab Results  Component Value Date   INR 1.0 01/31/2020   Lab Results  Component Value Date   NA 134 (L) 02/01/2020   K 4.1 02/01/2020   CL 100 02/01/2020   CO2 28 02/01/2020   BUN 24 (H) 02/01/2020   CREATININE 1.77 (H) 02/01/2020   GLUCOSE 280 (H) 02/01/2020    Discharge Medications:   Allergies as of 02/03/2020      Reactions   Bee Venom Anaphylaxis   Oxycodone Other (See Comments)   Delusions   Hydromorphone Other (See Comments)   hallucinating   Zolpidem Other (See Comments)      Medication List    STOP taking these medications   oxyCODONE 5 MG immediate release tablet Commonly known as: Oxy IR/ROXICODONE   traMADol 50 MG tablet Commonly known as: ULTRAM     TAKE these medications   amLODipine 10 MG tablet Commonly known as: NORVASC TAKE 1 TABLET BY MOUTH  DAILY   carvedilol 12.5 MG tablet Commonly known as: COREG Take 1 tablet (12.5 mg total) by mouth 2 (two) times daily.   celecoxib  200 MG capsule Commonly known as: CELEBREX Take 1 capsule (200 mg total) by mouth 2 (two) times daily.   clobetasol cream 0.05 % Commonly known as: TEMOVATE Apply 1 application topically 2 (two) times daily as needed (irritation).   clorazepate 7.5 MG tablet Commonly known as: TRANXENE TAKE 1 TABLET (7.5 MG TOTAL) BY MOUTH 2 (TWO) TIMES DAILY AS NEEDED FOR ANXIETY.   enoxaparin 40 MG/0.4ML injection Commonly known as: LOVENOX Inject 0.4 mLs (40 mg total) into the skin daily for 14 days.   EPINEPHrine 0.3 mg/0.3 mL Soaj injection Commonly known as: EPI-PEN Inject 0.3 mLs (0.3 mg total) into the muscle as needed for anaphylaxis.   furosemide 40 MG tablet Commonly known as: LASIX Take 40 mg by mouth 2 (two) times daily.   gabapentin 300 MG capsule Commonly known as: NEURONTIN TAKE 1 CAPSULE BY MOUTH  3  TIMES DAILY   glipiZIDE 5 MG tablet Commonly known as: GLUCOTROL TAKE ONE-HALF TABLET BY  MOUTH 2 TIMES DAILY BEFORE  A MEAL. What changed: See the new instructions.   HYDROcodone-acetaminophen 5-325 MG tablet Commonly known as: NORCO/VICODIN Take 1-2 tablets by mouth every 4 (four) hours as needed for moderate pain (pain score 4-6).   hydrOXYzine 25 MG tablet Commonly known as: ATARAX/VISTARIL TAKE ONE TABLET EVERY EIGHT HOURS AS NEEDED What changed: See the new instructions.   IRON PO Take 1 tablet by mouth at bedtime.   isosorbide mononitrate 60 MG 24 hr tablet Commonly known as: IMDUR Take 60 mg by mouth daily before lunch.   liraglutide 18 MG/3ML Sopn Commonly known as: VICTOZA Inject 0.2 mLs (1.2 mg total) into the skin daily.   losartan 100 MG tablet Commonly known as: COZAAR Take 1 tablet (100 mg total) by mouth daily. What changed: when to take this   Medical Compression Stockings Misc 2 Units by Does not apply route daily.   metFORMIN 500 MG tablet Commonly known as: GLUCOPHAGE TAKE 1 TABLET BY MOUTH TWO  TIMES DAILY What changed: when to take  this   Multivital tablet Take 1 tablet by mouth daily.   OneTouch Ultra test strip Generic drug: glucose blood CHECK BLOOD SUGAR 3 TIMES DAILY AS DIRECTED   rosuvastatin 20 MG tablet Commonly known as: CRESTOR TAKE ONE TABLET EVERY DAY   timolol 0.5 % ophthalmic solution Commonly known as: TIMOPTIC Apply 1 drop to eye 2 (two) times daily.   VITAMIN D-3 PO Take 10,000 Units by mouth daily.            Durable Medical Equipment  (From admission, onward)         Start     Ordered   02/01/20 2210  DME Walker rolling  Once       Question:  Patient needs a walker to treat with the following condition  Answer:  Total knee replacement status   02/01/20 2210   02/01/20 2210  DME Bedside commode  Once       Question:  Patient needs a bedside commode to treat with the following condition  Answer:  Total knee replacement status   02/01/20 2210         Diagnostic Studies: DG Knee Right Port  Result Date: 01/28/2020 CLINICAL DATA:  74 year old male with right knee replacement. EXAM: PORTABLE RIGHT KNEE - 1-2 VIEW COMPARISON:  Right knee radiograph dated 06/10/2006. FINDINGS: There is a total right knee arthroplasty. The arthroplasty components appear intact and in anatomic alignment. There is no acute fracture or dislocation. A drainage catheter is noted with tip at the patellofemoral compartment. Postsurgical changes and anterior knee cutaneous clips. IMPRESSION: Status post total right knee arthroplasty. Electronically Signed   By: Anner Crete M.D.   On: 01/28/2020 16:50   Disposition: Plan for discharge home with HHPT.  Discharge home with woundvac intact, will follow-up this week for skin check.    Follow-up Information    Fausto Skillern, PA-C Follow up.   Specialty: Orthopedic Surgery Why: Call on Monday to schedule appointment for skin check this week. Contact information: [place]  [id number] [phone]              Signed: Judson Roch PA-C 02/03/2020, 10:44 AM

## 2020-02-03 NOTE — Progress Notes (Signed)
Patient is being discharged to home with wife this afternoon. DC & Rx instructions given; questions answered, and patient acknowledged understanding.[place] returned to patient. Nurse transitioned to [place] for home use. IV removed. Patient called wife to transport him home.

## 2020-02-03 NOTE — TOC Transition Note (Signed)
Transition of Care [facility]) - CM/SW Discharge Note   Patient Details  Name: Daniel Kline MRN: [id number] Date of Birth: 1946/06/20  [facility] CM/SW Contact:  Boris Sharper, LCSW Phone Number: 02/03/2020, 1:57 PM   Clinical Narrative:    Pt medically stable for discharge per MD. Pt will be transported home by his Wife, pt is active with [facility]   Final next level of care: Home w Home Health Services Barriers to Discharge: No Barriers Identified   Patient Goals and CMS Choice        Discharge Placement                Patient to be transferred to facility by: wife Name of family member notified: [name] Patient and family notified of of transfer: 02/03/20  Discharge Plan and Services                          HH Arranged: PT [place] Agency: [facility] Date [place]: 02/03/20 Time [place]: [place] Representative spoke with at [place]: [place] ([place]) Interventions     Readmission Risk Interventions No flowsheet data found.

## 2020-02-03 NOTE — Progress Notes (Signed)
Subjective: 2 Days Post-Op Procedure(s) (LRB): IRRIGATION AND DEBRIDEMENT EXTREMITY with poly exchange (Right) Patient reports pain as mild.   Patient is well, and has had no acute complaints or problems Plan is to go Home after hospital stay. Negative for chest pain and shortness of breath Fever: no Gastrointestinal:Negative for nausea and vomiting  Objective: Vital signs in last 24 hours: Temp:  [97.5 F (36.4 C)-98.1 F (36.7 C)] 97.5 F (36.4 C) (06/27 0803) Pulse Rate:  [56-66] 56 (06/27 0803) Resp:  [16-17] 17 (06/27 0803) BP: (121-165)/(44-74) 153/74 (06/27 0803) SpO2:  [96 %-100 %] 98 % (06/27 0803)  Intake/Output from previous day:  Intake/Output Summary (Last 24 hours) at 02/03/2020 1006 Last data filed at 02/03/2020 0811 Gross per 24 hour  Intake 1454.09 ml  Output 2125 ml  Net -670.91 ml    Intake/Output this shift: Total I/O In: -  Out: 450 [Urine:450]  Labs: Recent Labs    01/31/20 1128 02/01/20 1313 02/02/20 1019 02/03/20 0609  HGB 10.3* 7.9* 6.9* 6.9*   Recent Labs    02/01/20 1313 02/01/20 1313 02/02/20 1019 02/03/20 0609  WBC 9.3  --   --  6.6  RBC 2.75*  --   --  2.37*  HCT 23.1*   < > 20.9* 20.6*  PLT 166  --   --  169   < > = values in this interval not displayed.   Recent Labs    01/31/20 1128 02/01/20 1313  NA 136 134*  K 4.2 4.1  CL 101 100  CO2 27 28  BUN 28* 24*  CREATININE 1.63* 1.77*  GLUCOSE 234* 280*  CALCIUM 8.7* 8.2*   Recent Labs    01/31/20 1128  INR 1.0     EXAM General - Patient is Alert, Appropriate and Oriented Extremity - ABD soft Sensation intact distally Intact pulses distally Dorsiflexion/Plantar flexion intact Dressing/Incision - Bulky dressing removed from the right leg. Woundvac is intact without any drainage, no bloody discharge on the sponge of the woundvac as well. Motor Function - intact, moving foot and toes well on exam.  Abdomen is soft with normal bowel sounds.  Past Medical  History:  Diagnosis Date  . Anemia    H/O  . Anxiety   . Arthritis   . Chronic kidney disease   . Complication of anesthesia   . 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 Diabetes mellitus without complication ([place])   . Dysrhythmia    A FLUTTER  . Family history of adverse reaction to anesthesia    PT WAS ADOPTED  . HLD (hyperlipidemia)   . HTN (hypertension)   . HTN (hypertension)   . Hx of dysplastic nevus 2019   multiple sites  . 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 MRSA (methicillin resistant Staphylococcus aureus)    after back surgery  . OSA (obstructive sleep apnea)    USES BIPAP  . Polio   . 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 PONV (postoperative nausea and vomiting)     Assessment/Plan: 2 Days Post-Op Procedure(s) (LRB): IRRIGATION AND DEBRIDEMENT EXTREMITY with poly exchange (Right) Active Problems:   Hematoma of right knee region  Estimated body mass index is 40.32 kg/m as calculated from the following:   Height as of this encounter: 5\' 6"  (1.676 m).   Weight as of this encounter: 113.3 kg. Advance diet Up with therapy D/C IV fluids when tolerating po intake.  Patient underwent  transfusion of 1 unit of PRBC yesterday, Hg the same at 6.9 this AM. Glucose 280 this AM, on sliding scale insulin. Plan to re-check hemoglobin at noon today.  He denies any dizziness or headache, no SOB. Bulky dressing removed, Woundvac without any active drainage. Up with therapy today, Monitor for any dizziness. If performs well with PT can plan on discharge home this afternoon.  DVT Prophylaxis - Lovenox, Foot Pumps and TED hose Weight-Bearing as tolerated to right leg  J. Cameron Proud, PA-C [facility] Orthopaedic Surgery 02/03/2020, 10:06 AM

## 2020-02-03 NOTE — Discharge Instructions (Signed)
Diet: As you were doing prior to hospitalization   Shower:  May shower but keep the wounds dry, use an occlusive plastic wrap, NO SOAKING IN TUB.  If the bandage gets wet, change with a clean dry gauze.  Dressing:  Leave woundvac on the right knee intact, do not remove.  Activity:  Increase activity slowly as tolerated, but follow the weight bearing instructions below.  No lifting or driving for 6 weeks.  Weight Bearing:   Weight bearing as tolerated to right lower extremity  To prevent constipation: you may use a stool softener such as -  Colace (over the counter) 100 mg by mouth twice a day  Drink plenty of fluids (prune juice may be helpful) and high fiber foods Miralax (over the counter) for constipation as needed.    Itching:  If you experience itching with your medications, try taking only a single pain pill, or even half a pain pill at a time.  You may take up to 10 pain pills per day, and you can also use benadryl over the counter for itching or also to help with sleep.   Precautions:  If you experience chest pain or shortness of breath - call 911 immediately for transfer to the hospital emergency department!!  If you develop a fever greater that 101 F, purulent drainage from wound, increased redness or drainage from wound, or calf pain-Call [facility]                                               Follow- Up Appointment:  Follow-up this upcoming week for skin check of the right knee.

## 2020-02-03 NOTE — Progress Notes (Signed)
Physical Therapy Treatment Patient Details Name: Daniel Kline MRN: [id number] DOB: Jan 26, 1946 Today's Date: 02/03/2020    History of Present Illness 74 year old male s/p right total knee arthroplasty 6/21. His initial postoperative course was relatively uneventful. However, he completely saturated the dressing, symptomatic and came back needing poly exchange 6/25.  Blood levels remain low at time of OT exam.    PT Comments    As per previous PT session pt very eager to participate and showed great motivation and effort t/o the session. He continues to have some pain with AROM quad engagement that is somewhat limiting, but ultimately he was able to do mobility, transfers, prolonged bout of ambulation and stair negotiation w/o direct PT assist and w/o safety concerns.  PT answered questions regarding course of recovery and HEP, pt making good gains and is appropriate for d/c from PT stand-point.    Follow Up Recommendations  Home health PT     Equipment Recommendations  None recommended by PT    Recommendations for Other Services       Precautions / Restrictions Precautions Precautions: Fall Restrictions RLE Weight Bearing: Weight bearing as tolerated    Mobility  Bed Mobility Overal bed mobility: Modified Independent             General bed mobility comments: Pt again slow to get to EOB, but able to get to sitting w/o physcial assist  Transfers Overall transfer level: Needs assistance Equipment used: Right platform walker Transfers: Sit to/from Stand Sit to Stand: Min guard            Ambulation/Gait Ambulation/Gait assistance: Min guard Gait Distance (Feet): 250 Feet Assistive device: Right platform walker       General Gait Details: Pt able to maintain slow but consistent and appropriate cadence while circumambulating the nurses' station, vitals WNL and stable.  No LOBs,    Stairs Stairs: Yes Stairs assistance: Supervision Stair Management: One rail  Right;Sideways;Step to pattern Number of Stairs: 4 General stair comments: Pt recalled appropriate stair negotiation w/o cuing and was able to ascend/descend 4 steps w/o assist or safety issue.   Wheelchair Mobility    Modified Rankin (Stroke Patients Only)       Balance Overall balance assessment: Modified Independent                                          Cognition Arousal/Alertness: Awake/alert Behavior During Therapy: WFL for tasks assessed/performed Overall Cognitive Status: Within Functional Limits for tasks assessed                                        Exercises Total Joint Exercises Ankle Circles/Pumps: AROM;10 reps Quad Sets: Strengthening;15 reps Short Arc Quad: AAROM;AROM;10 reps Heel Slides: Strengthening;10 reps (with resisted leg extensions) Hip ABduction/ADduction: Strengthening;15 reps Straight Leg Raises: AAROM;10 reps Knee Flexion: PROM;5 reps Goniometric ROM: 1-95    General Comments        Pertinent Vitals/Pain Pain Assessment: 0-10 Pain Score: 4     Home Living                      Prior Function            PT Goals (current goals can now be found in the care plan  section) Progress towards PT goals: Progressing toward goals    Frequency    BID      PT Plan Current plan remains appropriate    Co-evaluation              AM-PAC PT "6 Clicks" Mobility   Outcome Measure  Help needed turning from your back to your side while in a flat bed without using bedrails?: None Help needed moving from lying on your back to sitting on the side of a flat bed without using bedrails?: None Help needed moving to and from a bed to a chair (including a wheelchair)?: None Help needed standing up from a chair using your arms (e.g., wheelchair or bedside chair)?: None Help needed to walk in hospital room?: None Help needed climbing 3-5 steps with a railing? : None 6 Click Score: 24    End of  Session Equipment Utilized During Treatment: Gait belt Activity Tolerance: Patient tolerated treatment well Patient left: with chair alarm set;with call bell/phone within reach Nurse Communication: Mobility status PT Visit Diagnosis: Unsteadiness on feet (R26.81);Muscle weakness (generalized) (M62.81);Pain Pain - Right/Left: Right Pain - part of body: Knee     Time: 1019-1100 PT Time Calculation (min) (ACUTE ONLY): 41 min  Charges:  $Gait Training: 8-22 mins $Therapeutic Exercise: 8-22 mins $Therapeutic Activity: 8-22 mins                     Kreg Shropshire, DPT 02/03/2020, 1:57 PM

## 2020-02-05 DIAGNOSIS — M96830 Postprocedural hemorrhage and hematoma of a musculoskeletal structure following a musculoskeletal system procedure: Secondary | ICD-10-CM | POA: Diagnosis not present

## 2020-02-05 DIAGNOSIS — I4892 Unspecified atrial flutter: Secondary | ICD-10-CM | POA: Diagnosis not present

## 2020-02-05 DIAGNOSIS — G4733 Obstructive sleep apnea (adult) (pediatric): Secondary | ICD-10-CM | POA: Diagnosis not present

## 2020-02-05 DIAGNOSIS — N183 Chronic kidney disease, stage 3 unspecified: Secondary | ICD-10-CM | POA: Diagnosis not present

## 2020-02-05 DIAGNOSIS — E785 Hyperlipidemia, unspecified: Secondary | ICD-10-CM | POA: Diagnosis not present

## 2020-02-05 DIAGNOSIS — M96840 Postprocedural hematoma of a musculoskeletal structure following a musculoskeletal system procedure: Secondary | ICD-10-CM | POA: Diagnosis not present

## 2020-02-05 DIAGNOSIS — M0600 Rheumatoid arthritis without rheumatoid factor, unspecified site: Secondary | ICD-10-CM | POA: Diagnosis not present

## 2020-02-05 DIAGNOSIS — I129 Hypertensive chronic kidney disease with stage 1 through stage 4 chronic kidney disease, or unspecified chronic kidney disease: Secondary | ICD-10-CM | POA: Diagnosis not present

## 2020-02-05 DIAGNOSIS — D631 Anemia in chronic kidney disease: Secondary | ICD-10-CM | POA: Diagnosis not present

## 2020-02-05 DIAGNOSIS — Z96643 Presence of artificial hip joint, bilateral: Secondary | ICD-10-CM | POA: Diagnosis not present

## 2020-02-05 DIAGNOSIS — Z96651 Presence of right artificial knee joint: Secondary | ICD-10-CM | POA: Diagnosis not present

## 2020-02-05 DIAGNOSIS — H539 Unspecified visual disturbance: Secondary | ICD-10-CM | POA: Diagnosis not present

## 2020-02-05 DIAGNOSIS — E1122 Type 2 diabetes mellitus with diabetic chronic kidney disease: Secondary | ICD-10-CM | POA: Diagnosis not present

## 2020-02-05 DIAGNOSIS — K219 Gastro-esophageal reflux disease without esophagitis: Secondary | ICD-10-CM | POA: Diagnosis not present

## 2020-02-05 DIAGNOSIS — G8929 Other chronic pain: Secondary | ICD-10-CM | POA: Diagnosis not present

## 2020-02-05 DIAGNOSIS — Z85828 Personal history of other malignant neoplasm of skin: Secondary | ICD-10-CM | POA: Diagnosis not present

## 2020-02-05 DIAGNOSIS — Z79899 Other long term (current) drug therapy: Secondary | ICD-10-CM | POA: Diagnosis not present

## 2020-02-05 DIAGNOSIS — E1140 Type 2 diabetes mellitus with diabetic neuropathy, unspecified: Secondary | ICD-10-CM | POA: Diagnosis not present

## 2020-02-05 DIAGNOSIS — Z7984 Long term (current) use of oral hypoglycemic drugs: Secondary | ICD-10-CM | POA: Diagnosis not present

## 2020-02-05 DIAGNOSIS — Z8572 Personal history of non-Hodgkin lymphomas: Secondary | ICD-10-CM | POA: Diagnosis not present

## 2020-02-07 ENCOUNTER — Telehealth: Payer: Self-pay | Admitting: Cardiovascular Disease

## 2020-02-07 DIAGNOSIS — G8929 Other chronic pain: Secondary | ICD-10-CM | POA: Diagnosis not present

## 2020-02-07 DIAGNOSIS — E785 Hyperlipidemia, unspecified: Secondary | ICD-10-CM | POA: Diagnosis not present

## 2020-02-07 DIAGNOSIS — I129 Hypertensive chronic kidney disease with stage 1 through stage 4 chronic kidney disease, or unspecified chronic kidney disease: Secondary | ICD-10-CM | POA: Diagnosis not present

## 2020-02-07 DIAGNOSIS — Z96643 Presence of artificial hip joint, bilateral: Secondary | ICD-10-CM | POA: Diagnosis not present

## 2020-02-07 DIAGNOSIS — Z8572 Personal history of non-Hodgkin lymphomas: Secondary | ICD-10-CM | POA: Diagnosis not present

## 2020-02-07 DIAGNOSIS — E1140 Type 2 diabetes mellitus with diabetic neuropathy, unspecified: Secondary | ICD-10-CM | POA: Diagnosis not present

## 2020-02-07 DIAGNOSIS — N183 Chronic kidney disease, stage 3 unspecified: Secondary | ICD-10-CM | POA: Diagnosis not present

## 2020-02-07 DIAGNOSIS — D631 Anemia in chronic kidney disease: Secondary | ICD-10-CM | POA: Diagnosis not present

## 2020-02-07 DIAGNOSIS — M0600 Rheumatoid arthritis without rheumatoid factor, unspecified site: Secondary | ICD-10-CM | POA: Diagnosis not present

## 2020-02-07 DIAGNOSIS — Z79899 Other long term (current) drug therapy: Secondary | ICD-10-CM | POA: Diagnosis not present

## 2020-02-07 DIAGNOSIS — Z96651 Presence of right artificial knee joint: Secondary | ICD-10-CM | POA: Diagnosis not present

## 2020-02-07 DIAGNOSIS — I4892 Unspecified atrial flutter: Secondary | ICD-10-CM | POA: Diagnosis not present

## 2020-02-07 DIAGNOSIS — G4733 Obstructive sleep apnea (adult) (pediatric): Secondary | ICD-10-CM | POA: Diagnosis not present

## 2020-02-07 DIAGNOSIS — Z85828 Personal history of other malignant neoplasm of skin: Secondary | ICD-10-CM | POA: Diagnosis not present

## 2020-02-07 DIAGNOSIS — M96830 Postprocedural hemorrhage and hematoma of a musculoskeletal structure following a musculoskeletal system procedure: Secondary | ICD-10-CM | POA: Diagnosis not present

## 2020-02-07 DIAGNOSIS — K219 Gastro-esophageal reflux disease without esophagitis: Secondary | ICD-10-CM | POA: Diagnosis not present

## 2020-02-07 DIAGNOSIS — H539 Unspecified visual disturbance: Secondary | ICD-10-CM | POA: Diagnosis not present

## 2020-02-07 DIAGNOSIS — Z7984 Long term (current) use of oral hypoglycemic drugs: Secondary | ICD-10-CM | POA: Diagnosis not present

## 2020-02-07 DIAGNOSIS — E1122 Type 2 diabetes mellitus with diabetic chronic kidney disease: Secondary | ICD-10-CM | POA: Diagnosis not present

## 2020-02-07 DIAGNOSIS — M96840 Postprocedural hematoma of a musculoskeletal structure following a musculoskeletal system procedure: Secondary | ICD-10-CM | POA: Diagnosis not present

## 2020-02-07 NOTE — Telephone Encounter (Signed)
Appt too soon   Attempted to reschedule no ans no vm

## 2020-02-07 NOTE — Telephone Encounter (Signed)
-----   Message from Britt Bottom, [place] sent at 02/07/2020 11:12 AM EDT ----- Pt has scheduled appointment with Dr.Gollan on 7/13 for OD 12 month f/u. Pt went to [facility] office for preoperative clearance and saw Levell July, NP 01/23/2020 told to f/u in office in 6 months. Please advise not sure which office pt will be following up and if appointment is needed.

## 2020-02-08 DIAGNOSIS — M96830 Postprocedural hemorrhage and hematoma of a musculoskeletal structure following a musculoskeletal system procedure: Secondary | ICD-10-CM | POA: Diagnosis not present

## 2020-02-08 DIAGNOSIS — M0600 Rheumatoid arthritis without rheumatoid factor, unspecified site: Secondary | ICD-10-CM | POA: Diagnosis not present

## 2020-02-08 DIAGNOSIS — I4892 Unspecified atrial flutter: Secondary | ICD-10-CM | POA: Diagnosis not present

## 2020-02-08 DIAGNOSIS — G4733 Obstructive sleep apnea (adult) (pediatric): Secondary | ICD-10-CM | POA: Diagnosis not present

## 2020-02-08 DIAGNOSIS — N183 Chronic kidney disease, stage 3 unspecified: Secondary | ICD-10-CM | POA: Diagnosis not present

## 2020-02-08 DIAGNOSIS — Z8572 Personal history of non-Hodgkin lymphomas: Secondary | ICD-10-CM | POA: Diagnosis not present

## 2020-02-08 DIAGNOSIS — Z85828 Personal history of other malignant neoplasm of skin: Secondary | ICD-10-CM | POA: Diagnosis not present

## 2020-02-08 DIAGNOSIS — E785 Hyperlipidemia, unspecified: Secondary | ICD-10-CM | POA: Diagnosis not present

## 2020-02-08 DIAGNOSIS — H539 Unspecified visual disturbance: Secondary | ICD-10-CM | POA: Diagnosis not present

## 2020-02-08 DIAGNOSIS — E1122 Type 2 diabetes mellitus with diabetic chronic kidney disease: Secondary | ICD-10-CM | POA: Diagnosis not present

## 2020-02-08 DIAGNOSIS — Z96651 Presence of right artificial knee joint: Secondary | ICD-10-CM | POA: Diagnosis not present

## 2020-02-08 DIAGNOSIS — Z96643 Presence of artificial hip joint, bilateral: Secondary | ICD-10-CM | POA: Diagnosis not present

## 2020-02-08 DIAGNOSIS — D631 Anemia in chronic kidney disease: Secondary | ICD-10-CM | POA: Diagnosis not present

## 2020-02-08 DIAGNOSIS — M96840 Postprocedural hematoma of a musculoskeletal structure following a musculoskeletal system procedure: Secondary | ICD-10-CM | POA: Diagnosis not present

## 2020-02-08 DIAGNOSIS — E1140 Type 2 diabetes mellitus with diabetic neuropathy, unspecified: Secondary | ICD-10-CM | POA: Diagnosis not present

## 2020-02-08 DIAGNOSIS — K219 Gastro-esophageal reflux disease without esophagitis: Secondary | ICD-10-CM | POA: Diagnosis not present

## 2020-02-08 DIAGNOSIS — G8929 Other chronic pain: Secondary | ICD-10-CM | POA: Diagnosis not present

## 2020-02-08 DIAGNOSIS — Z79899 Other long term (current) drug therapy: Secondary | ICD-10-CM | POA: Diagnosis not present

## 2020-02-08 DIAGNOSIS — Z7984 Long term (current) use of oral hypoglycemic drugs: Secondary | ICD-10-CM | POA: Diagnosis not present

## 2020-02-08 DIAGNOSIS — I129 Hypertensive chronic kidney disease with stage 1 through stage 4 chronic kidney disease, or unspecified chronic kidney disease: Secondary | ICD-10-CM | POA: Diagnosis not present

## 2020-02-10 DIAGNOSIS — M96840 Postprocedural hematoma of a musculoskeletal structure following a musculoskeletal system procedure: Secondary | ICD-10-CM | POA: Diagnosis not present

## 2020-02-10 DIAGNOSIS — Z96651 Presence of right artificial knee joint: Secondary | ICD-10-CM | POA: Diagnosis not present

## 2020-02-10 DIAGNOSIS — Z7984 Long term (current) use of oral hypoglycemic drugs: Secondary | ICD-10-CM | POA: Diagnosis not present

## 2020-02-10 DIAGNOSIS — I4892 Unspecified atrial flutter: Secondary | ICD-10-CM | POA: Diagnosis not present

## 2020-02-10 DIAGNOSIS — G8929 Other chronic pain: Secondary | ICD-10-CM | POA: Diagnosis not present

## 2020-02-10 DIAGNOSIS — D631 Anemia in chronic kidney disease: Secondary | ICD-10-CM | POA: Diagnosis not present

## 2020-02-10 DIAGNOSIS — E1122 Type 2 diabetes mellitus with diabetic chronic kidney disease: Secondary | ICD-10-CM | POA: Diagnosis not present

## 2020-02-10 DIAGNOSIS — E785 Hyperlipidemia, unspecified: Secondary | ICD-10-CM | POA: Diagnosis not present

## 2020-02-10 DIAGNOSIS — G4733 Obstructive sleep apnea (adult) (pediatric): Secondary | ICD-10-CM | POA: Diagnosis not present

## 2020-02-10 DIAGNOSIS — H539 Unspecified visual disturbance: Secondary | ICD-10-CM | POA: Diagnosis not present

## 2020-02-10 DIAGNOSIS — M0600 Rheumatoid arthritis without rheumatoid factor, unspecified site: Secondary | ICD-10-CM | POA: Diagnosis not present

## 2020-02-10 DIAGNOSIS — E1140 Type 2 diabetes mellitus with diabetic neuropathy, unspecified: Secondary | ICD-10-CM | POA: Diagnosis not present

## 2020-02-10 DIAGNOSIS — I129 Hypertensive chronic kidney disease with stage 1 through stage 4 chronic kidney disease, or unspecified chronic kidney disease: Secondary | ICD-10-CM | POA: Diagnosis not present

## 2020-02-10 DIAGNOSIS — Z85828 Personal history of other malignant neoplasm of skin: Secondary | ICD-10-CM | POA: Diagnosis not present

## 2020-02-10 DIAGNOSIS — Z8572 Personal history of non-Hodgkin lymphomas: Secondary | ICD-10-CM | POA: Diagnosis not present

## 2020-02-10 DIAGNOSIS — M96830 Postprocedural hemorrhage and hematoma of a musculoskeletal structure following a musculoskeletal system procedure: Secondary | ICD-10-CM | POA: Diagnosis not present

## 2020-02-10 DIAGNOSIS — K219 Gastro-esophageal reflux disease without esophagitis: Secondary | ICD-10-CM | POA: Diagnosis not present

## 2020-02-10 DIAGNOSIS — Z96643 Presence of artificial hip joint, bilateral: Secondary | ICD-10-CM | POA: Diagnosis not present

## 2020-02-10 DIAGNOSIS — N183 Chronic kidney disease, stage 3 unspecified: Secondary | ICD-10-CM | POA: Diagnosis not present

## 2020-02-10 DIAGNOSIS — Z79899 Other long term (current) drug therapy: Secondary | ICD-10-CM | POA: Diagnosis not present

## 2020-02-11 DIAGNOSIS — E1122 Type 2 diabetes mellitus with diabetic chronic kidney disease: Secondary | ICD-10-CM | POA: Diagnosis not present

## 2020-02-11 DIAGNOSIS — Z79899 Other long term (current) drug therapy: Secondary | ICD-10-CM | POA: Diagnosis not present

## 2020-02-11 DIAGNOSIS — Z96651 Presence of right artificial knee joint: Secondary | ICD-10-CM | POA: Diagnosis not present

## 2020-02-11 DIAGNOSIS — Z8572 Personal history of non-Hodgkin lymphomas: Secondary | ICD-10-CM | POA: Diagnosis not present

## 2020-02-11 DIAGNOSIS — E785 Hyperlipidemia, unspecified: Secondary | ICD-10-CM | POA: Diagnosis not present

## 2020-02-11 DIAGNOSIS — K219 Gastro-esophageal reflux disease without esophagitis: Secondary | ICD-10-CM | POA: Diagnosis not present

## 2020-02-11 DIAGNOSIS — M96840 Postprocedural hematoma of a musculoskeletal structure following a musculoskeletal system procedure: Secondary | ICD-10-CM | POA: Diagnosis not present

## 2020-02-11 DIAGNOSIS — G4733 Obstructive sleep apnea (adult) (pediatric): Secondary | ICD-10-CM | POA: Diagnosis not present

## 2020-02-11 DIAGNOSIS — N183 Chronic kidney disease, stage 3 unspecified: Secondary | ICD-10-CM | POA: Diagnosis not present

## 2020-02-11 DIAGNOSIS — I129 Hypertensive chronic kidney disease with stage 1 through stage 4 chronic kidney disease, or unspecified chronic kidney disease: Secondary | ICD-10-CM | POA: Diagnosis not present

## 2020-02-11 DIAGNOSIS — D631 Anemia in chronic kidney disease: Secondary | ICD-10-CM | POA: Diagnosis not present

## 2020-02-11 DIAGNOSIS — I4892 Unspecified atrial flutter: Secondary | ICD-10-CM | POA: Diagnosis not present

## 2020-02-11 DIAGNOSIS — Z96643 Presence of artificial hip joint, bilateral: Secondary | ICD-10-CM | POA: Diagnosis not present

## 2020-02-11 DIAGNOSIS — E1140 Type 2 diabetes mellitus with diabetic neuropathy, unspecified: Secondary | ICD-10-CM | POA: Diagnosis not present

## 2020-02-11 DIAGNOSIS — M0600 Rheumatoid arthritis without rheumatoid factor, unspecified site: Secondary | ICD-10-CM | POA: Diagnosis not present

## 2020-02-11 DIAGNOSIS — Z85828 Personal history of other malignant neoplasm of skin: Secondary | ICD-10-CM | POA: Diagnosis not present

## 2020-02-11 DIAGNOSIS — M96830 Postprocedural hemorrhage and hematoma of a musculoskeletal structure following a musculoskeletal system procedure: Secondary | ICD-10-CM | POA: Diagnosis not present

## 2020-02-11 DIAGNOSIS — G8929 Other chronic pain: Secondary | ICD-10-CM | POA: Diagnosis not present

## 2020-02-11 DIAGNOSIS — Z7984 Long term (current) use of oral hypoglycemic drugs: Secondary | ICD-10-CM | POA: Diagnosis not present

## 2020-02-11 DIAGNOSIS — H539 Unspecified visual disturbance: Secondary | ICD-10-CM | POA: Diagnosis not present

## 2020-02-12 DIAGNOSIS — M25661 Stiffness of right knee, not elsewhere classified: Secondary | ICD-10-CM | POA: Diagnosis not present

## 2020-02-12 DIAGNOSIS — M6281 Muscle weakness (generalized): Secondary | ICD-10-CM | POA: Diagnosis not present

## 2020-02-12 DIAGNOSIS — M25561 Pain in right knee: Secondary | ICD-10-CM | POA: Diagnosis not present

## 2020-02-12 DIAGNOSIS — Z96651 Presence of right artificial knee joint: Secondary | ICD-10-CM | POA: Diagnosis not present

## 2020-02-14 DIAGNOSIS — M25561 Pain in right knee: Secondary | ICD-10-CM | POA: Diagnosis not present

## 2020-02-14 DIAGNOSIS — Z96651 Presence of right artificial knee joint: Secondary | ICD-10-CM | POA: Diagnosis not present

## 2020-02-18 ENCOUNTER — Other Ambulatory Visit: Payer: Self-pay | Admitting: Internal Medicine

## 2020-02-18 NOTE — Telephone Encounter (Signed)
Spoke to pt. He had knee replacement. Just got 50 on 6-29 from Ortho. I advised him that since they are treating him right now, they need to write the refills. The last we had, he had stopped taking it.  I am unable to delete the rx request from the pharmacy. It states I do not have authorization. Please refuse the refill. Thanks

## 2020-02-19 ENCOUNTER — Ambulatory Visit: Payer: Medicare Other | Admitting: Cardiovascular Disease

## 2020-02-19 DIAGNOSIS — M25561 Pain in right knee: Secondary | ICD-10-CM | POA: Diagnosis not present

## 2020-02-19 DIAGNOSIS — Z96651 Presence of right artificial knee joint: Secondary | ICD-10-CM | POA: Diagnosis not present

## 2020-02-19 NOTE — Telephone Encounter (Signed)
I will be taking this back over when the post op period is over (it may be) Please call the orthopedist to see whether they are still prescribing the hydrocodone or not

## 2020-02-21 ENCOUNTER — Other Ambulatory Visit: Payer: Self-pay | Admitting: Cardiovascular Disease

## 2020-02-21 DIAGNOSIS — Z96651 Presence of right artificial knee joint: Secondary | ICD-10-CM | POA: Diagnosis not present

## 2020-02-21 DIAGNOSIS — M25561 Pain in right knee: Secondary | ICD-10-CM | POA: Diagnosis not present

## 2020-02-21 DIAGNOSIS — I10 Essential (primary) hypertension: Secondary | ICD-10-CM

## 2020-02-21 NOTE — Telephone Encounter (Signed)
Refill request

## 2020-02-21 NOTE — Telephone Encounter (Signed)
Left message for pt to see if they filled the medication for him.

## 2020-02-21 NOTE — Telephone Encounter (Signed)
Called Dr Clydell Hakim office to verify how long he would be doing the pain medication. Spoke to [employer]. She said they usually do the medication for 6 weeks post-op.  Please deny this refill. It will not let me. Thanks

## 2020-02-26 DIAGNOSIS — Z96651 Presence of right artificial knee joint: Secondary | ICD-10-CM | POA: Diagnosis not present

## 2020-02-27 DIAGNOSIS — R768 Other specified abnormal immunological findings in serum: Secondary | ICD-10-CM | POA: Diagnosis not present

## 2020-02-27 DIAGNOSIS — M0600 Rheumatoid arthritis without rheumatoid factor, unspecified site: Secondary | ICD-10-CM | POA: Diagnosis not present

## 2020-02-27 DIAGNOSIS — Z79899 Other long term (current) drug therapy: Secondary | ICD-10-CM | POA: Diagnosis not present

## 2020-02-28 DIAGNOSIS — M25561 Pain in right knee: Secondary | ICD-10-CM | POA: Diagnosis not present

## 2020-02-28 DIAGNOSIS — Z96651 Presence of right artificial knee joint: Secondary | ICD-10-CM | POA: Diagnosis not present

## 2020-03-04 DIAGNOSIS — Z96651 Presence of right artificial knee joint: Secondary | ICD-10-CM | POA: Diagnosis not present

## 2020-03-06 DIAGNOSIS — M25561 Pain in right knee: Secondary | ICD-10-CM | POA: Diagnosis not present

## 2020-03-06 DIAGNOSIS — Z96651 Presence of right artificial knee joint: Secondary | ICD-10-CM | POA: Diagnosis not present

## 2020-03-07 ENCOUNTER — Encounter: Payer: Self-pay | Admitting: Cardiovascular Disease

## 2020-03-07 ENCOUNTER — Ambulatory Visit: Payer: Medicare Other | Admitting: Cardiovascular Disease

## 2020-03-07 ENCOUNTER — Other Ambulatory Visit: Payer: Self-pay

## 2020-03-07 VITALS — BP 142/72 | HR 69 | Ht 66.0 in | Wt 232.5 lb

## 2020-03-07 DIAGNOSIS — I483 Typical atrial flutter: Secondary | ICD-10-CM

## 2020-03-07 DIAGNOSIS — I10 Essential (primary) hypertension: Secondary | ICD-10-CM | POA: Diagnosis not present

## 2020-03-07 DIAGNOSIS — N182 Chronic kidney disease, stage 2 (mild): Secondary | ICD-10-CM

## 2020-03-07 DIAGNOSIS — E1121 Type 2 diabetes mellitus with diabetic nephropathy: Secondary | ICD-10-CM | POA: Diagnosis not present

## 2020-03-07 DIAGNOSIS — G4733 Obstructive sleep apnea (adult) (pediatric): Secondary | ICD-10-CM

## 2020-03-07 DIAGNOSIS — E782 Mixed hyperlipidemia: Secondary | ICD-10-CM

## 2020-03-07 NOTE — Progress Notes (Signed)
Cardiology Office Note  Date:  03/07/2020   ID:  Levern, Pitter November 16, 1945, MRN [id number]  PCP:  [name], MD   Chief Complaint  Patient presents with  . other    12 month follow up. Meds reviewed by the pt. verbally. "doing well."     HPI:  [name] is a 74 year old gentleman with past medical history of Diabetes Chronic pain Obstructive sleep apnea, uses CPAP F/u for his  Hypertension, atrial flutter s/p ablation 04/2018 at [facility] seen in clinic June 2020 by myself  Was seen by one of the other cardiology providers for preop clearance June 2021 He has had atrial flutter ablation On CPAP  Has completed right total knee arthroplasty with Dr. Marry Guan January 28, 2020 Post procedure complication of bleeding, lovenox Right knee arthrotomy, evacuation of hematoma, irrigation and debridement, No evidence of DVT.Prevena woundvac  One blood transfusion for HGB 6.9  Presenting today in wheelchair Doing pt 2x a week Other days inactive, some walking  HBA1C 7.8  Wife helps at home, she is sedentary, back and knee problems  Medications reviewed with him in detail  amlodipine, carvedilol,  losartan, Lasix  EKG personally reviewed by myself on todays visit Shows normal sinus rhythm rate 69 bpm no significant ST or T wave changes No change   PMH:   has a past medical history of Anemia, Anxiety, Arthritis, Chronic kidney disease, Complication of anesthesia, 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  PSH:    Past Surgical History:  Procedure  Laterality Date  . BACK SURGERY     LUMBAR  . CARDIAC ELECTROPHYSIOLOGY STUDY AND ABLATION  2019  . COLONOSCOPY WITH PROPOFOL N/A 04/04/2019   Procedure: COLONOSCOPY WITH PROPOFOL;  Surgeon: Toledo, Benay Pike, MD;  Location: [facility] ENDOSCOPY;  Service: Gastroenterology;  Laterality: N/A;  . I & D EXTREMITY Right 02/01/2020   Procedure: IRRIGATION AND DEBRIDEMENT EXTREMITY with poly exchange;  Surgeon: Dereck Leep, MD;  Location: [facility] ORS;  Service: Orthopedics;  Laterality: Right;  . INCISION AND DRAINAGE     BACK-MRSA INFECTION AFTER BACK SURGERY  . KNEE ARTHROPLASTY Right 01/28/2020   Procedure: COMPUTER ASSISTED TOTAL KNEE ARTHROPLASTY;  Surgeon: Dereck Leep, MD;  Location: [facility] ORS;  Service: Orthopedics;  Laterality: Right;  . MOUTH SURGERY    . right elbow surgery    . right knee surgery    . right shoulder surgery     from polio damage  . TONSILLECTOMY    . TOTAL HIP ARTHROPLASTY Bilateral 04/2016    Current Outpatient Medications  Medication Sig Dispense Refill  . amLODipine (NORVASC) 10 MG tablet TAKE 1 TABLET BY MOUTH  DAILY (Patient taking differently: Take 10 mg by mouth daily. ) 90 tablet 3  . carvedilol (COREG) 12.5 MG tablet Take 1 tablet (12.5 mg total) by mouth 2 (two) times daily. 180 tablet 3  . celecoxib (CELEBREX) 200 MG capsule Take 1 capsule (200 mg total) by mouth 2 (two) times daily. 90 capsule 0  . Cholecalciferol (VITAMIN D-3 PO) Take 10,000 Units by mouth daily.     . clobetasol cream (TEMOVATE) 2.68 % Apply 1 application topically 2 (two) times daily as needed (irritation).     Marland Kitchen  clorazepate (TRANXENE) 7.5 MG tablet TAKE 1 TABLET (7.5 MG TOTAL) BY MOUTH 2 (TWO) TIMES DAILY AS NEEDED FOR ANXIETY. 60 tablet 0  . Elastic Bandages & Supports (MEDICAL COMPRESSION STOCKINGS) MISC 2 Units by Does not apply route daily. 2 each 0  . EPINEPHrine 0.3 mg/0.3 mL IJ SOAJ injection Inject 0.3 mLs (0.3 mg total) into the muscle as needed for anaphylaxis. 1 each 5  .  Ferrous Sulfate (IRON PO) Take 1 tablet by mouth at bedtime.    . furosemide (LASIX) 40 MG tablet Take 40 mg by mouth 2 (two) times daily.    Marland Kitchen gabapentin (NEURONTIN) 300 MG capsule TAKE 1 CAPSULE BY MOUTH 3  TIMES DAILY (Patient taking differently: Take 300 mg by mouth 3 (three) times daily. ) 270 capsule 3  . glipiZIDE (GLUCOTROL) 5 MG tablet TAKE ONE-HALF TABLET BY  MOUTH 2 TIMES DAILY BEFORE  A MEAL. (Patient taking differently: Take 2.5 mg by mouth 2 (two) times daily before a meal. ) 90 tablet 3  . hydrOXYzine (ATARAX/VISTARIL) 25 MG tablet TAKE ONE TABLET EVERY EIGHT HOURS AS NEEDED (Patient taking differently: Take 25 mg by mouth every 8 (eight) hours as needed for anxiety. ) 90 tablet 3  . isosorbide mononitrate (IMDUR) 60 MG 24 hr tablet Take 60 mg by mouth daily before lunch.     . liraglutide (VICTOZA) 18 MG/3ML SOPN Inject 0.2 mLs (1.2 mg total) into the skin daily. 2 pen 11  . losartan (COZAAR) 100 MG tablet TAKE ONE TABLET EVERY DAY 90 tablet 1  . metFORMIN (GLUCOPHAGE) 500 MG tablet TAKE 1 TABLET BY MOUTH TWO  TIMES DAILY (Patient taking differently: Take 500 mg by mouth 2 (two) times daily with a meal. ) 180 tablet 3  . Multiple Vitamins-Minerals (MULTIVITAL) tablet Take 1 tablet by mouth daily.      Glory Rosebush ULTRA test strip CHECK BLOOD SUGAR 3 TIMES DAILY AS DIRECTED 100 each 1  . rosuvastatin (CRESTOR) 20 MG tablet TAKE ONE TABLET EVERY DAY (Patient taking differently: Take 20 mg by mouth daily. ) 90 tablet 3  . timolol (TIMOPTIC) 0.5 % ophthalmic solution Apply 1 drop to eye 2 (two) times daily.    Marland Kitchen HYDROcodone-acetaminophen (NORCO/VICODIN) 5-325 MG tablet Take 1-2 tablets by mouth every 4 (four) hours as needed for moderate pain (pain score 4-6). (Patient not taking: Reported on 03/07/2020) 50 tablet 0   No current facility-administered medications for this visit.     Allergies:   Bee venom, Oxycodone, Hydromorphone, and Zolpidem   Social History:  The patient  reports  that he quit smoking about 29 years ago. His smoking use included cigars. He quit smokeless tobacco use about 14 years ago. He reports that he does not drink alcohol and does not use drugs.   Family History:   family history is not on file. He was adopted.    Review of Systems: Review of Systems  Constitutional: Negative.   HENT: Negative.   Respiratory: Negative.   Cardiovascular: Negative.   Gastrointestinal: Negative.   Musculoskeletal: Positive for joint pain.       Knee swelling  Neurological: Negative.   Psychiatric/Behavioral: Negative.   All other systems reviewed and are negative.   PHYSICAL EXAM: VS:  BP (!) 142/72 (BP Location: Left Arm, Patient Position: Sitting, Cuff Size: Large)   Pulse 69   Ht 5\' 6"  (1.676 m)   Wt (!) 232 lb 8 oz (105.5 kg)   SpO2 98%   BMI 37.53  kg/m  , BMI Body mass index is 37.53 kg/m. Constitutional:  oriented to person, place, and time. No distress. in wheelchair HENT:  Head: Normocephalic and atraumatic.  Eyes:  no discharge. No scleral icterus.  Neck: Normal range of motion. Neck supple. No JVD present.  Cardiovascular: Normal rate, regular rhythm, normal heart sounds and intact distal pulses. Exam reveals no gallop and no friction rub. No edema No murmur heard. Pulmonary/Chest: Effort normal and breath sounds normal. No stridor. No respiratory distress.  no wheezes.  no rales.  no tenderness.  Abdominal: Soft.  no distension.  no tenderness.  Musculoskeletal: Normal range of motion.  no  tenderness or deformity.  Neurological:  normal muscle tone. Coordination normal. No atrophy Skin: Skin is warm and dry. No rash noted. not diaphoretic.  Psychiatric:  normal mood and affect. behavior is normal. Thought content normal.    Recent Labs: 01/21/2020: ALT 21 02/01/2020: BUN 24; Creatinine, Ser 1.77; Potassium 4.1; Sodium 134 02/03/2020: Hemoglobin 8.0; Platelets 169    Lipid Panel Lab Results  Component Value Date   CHOL 95  01/09/2018   HDL 27.60 (L) 01/09/2018   LDLCALC 33 01/09/2018   TRIG 175.0 (H) 01/09/2018      Wt Readings from Last 3 Encounters:  03/07/20 (!) 232 lb 8 oz (105.5 kg)  02/01/20 249 lb 12.5 oz (113.3 kg)  01/31/20 230 lb (104.3 kg)       ASSESSMENT AND PLAN:  Atrial flutter Not on anticoagulation, had ablation in 2019 Maintaining normal sinus rhythm since that time Details discussed, overall stable  Diabetes mellitus with nephropathy ([place]) - Plan: EKG 12-Lead Stressed importance of low carbohydrate diet HBA1C 7.8, weight higher Eating snickers, less active Dietary suggestions provided  CKD (chronic kidney disease), stage II Stable CR 1.77, BUN 24 one month ago Up from baseline Will need to be monitored, May need to consider holding his amlodipine as this can contribute to leg swelling, cutting back on his Lasix The above was discussed, no changes made at this time  Essential hypertension - Plan: EKG 12-Lead Blood pressure is well controlled on today's visit. No changes made to the medications. Could consider changes as above for any worsening renal dysfunction  OSA (obstructive sleep apnea) Compliant with his CPAP     Total encounter time more than 25 minutes  Greater than 50% was spent in counseling and coordination of care with the patient   No orders of the defined types were placed in this encounter.    Signed, Daniel Kline, M.D., Ph.D. 03/07/2020  [place], [place]

## 2020-03-07 NOTE — Patient Instructions (Signed)

## 2020-03-09 DIAGNOSIS — M96840 Postprocedural hematoma of a musculoskeletal structure following a musculoskeletal system procedure: Secondary | ICD-10-CM | POA: Diagnosis not present

## 2020-03-10 ENCOUNTER — Ambulatory Visit (INDEPENDENT_AMBULATORY_CARE_PROVIDER_SITE_OTHER): Payer: Medicare Other | Admitting: Internal Medicine

## 2020-03-10 ENCOUNTER — Encounter: Payer: Self-pay | Admitting: Internal Medicine

## 2020-03-10 ENCOUNTER — Other Ambulatory Visit: Payer: Self-pay

## 2020-03-10 VITALS — BP 128/84 | HR 82 | Temp 97.9°F | Ht 67.0 in | Wt 229.0 lb

## 2020-03-10 DIAGNOSIS — M0600 Rheumatoid arthritis without rheumatoid factor, unspecified site: Secondary | ICD-10-CM | POA: Diagnosis not present

## 2020-03-10 DIAGNOSIS — N1832 Chronic kidney disease, stage 3b: Secondary | ICD-10-CM

## 2020-03-10 DIAGNOSIS — F1120 Opioid dependence, uncomplicated: Secondary | ICD-10-CM | POA: Diagnosis not present

## 2020-03-10 DIAGNOSIS — E1129 Type 2 diabetes mellitus with other diabetic kidney complication: Secondary | ICD-10-CM | POA: Diagnosis not present

## 2020-03-10 DIAGNOSIS — Z0000 Encounter for general adult medical examination without abnormal findings: Secondary | ICD-10-CM | POA: Insufficient documentation

## 2020-03-10 DIAGNOSIS — E1165 Type 2 diabetes mellitus with hyperglycemia: Secondary | ICD-10-CM

## 2020-03-10 DIAGNOSIS — I10 Essential (primary) hypertension: Secondary | ICD-10-CM

## 2020-03-10 DIAGNOSIS — IMO0002 Reserved for concepts with insufficient information to code with codable children: Secondary | ICD-10-CM

## 2020-03-10 NOTE — Assessment & Plan Note (Signed)
Last GFR 37 Is on losartan

## 2020-03-10 NOTE — Assessment & Plan Note (Signed)
I will take over the prescribing as soon as ortho is done PDMP fine

## 2020-03-10 NOTE — Assessment & Plan Note (Signed)
BP Readings from Last 3 Encounters:  03/10/20 128/84  03/07/20 (!) 142/72  02/03/20 (!) 153/74   Good control on losartan , coreg Isosorbide for BP as well

## 2020-03-10 NOTE — Assessment & Plan Note (Signed)
Continues on the arava New rheumatologist now

## 2020-03-10 NOTE — Assessment & Plan Note (Signed)
I have personally reviewed the Medicare Annual Wellness questionnaire and have noted 1. The patient's medical and social history 2. Their use of alcohol, tobacco or illicit drugs 3. Their current medications and supplements 4. The patient's functional ability including ADL's, fall risks, home safety risks and hearing or visual             impairment. 5. Diet and physical activities 6. Evidence for depression or mood disorders  The patients weight, height, BMI and visual acuity have been recorded in the chart I have made referrals, counseling and provided education to the patient based review of the above and I have provided the pt with a written personalized care plan for preventive services.  I have provided you with a copy of your personalized plan for preventive services. Please take the time to review along with your updated medication list.  Flu vaccine in the fall Had COVID Colon 1 year ago with polyps---will consider one more in 3-4 years No PSA due to age Trying to stay active with regular exercise--PT still

## 2020-03-10 NOTE — Progress Notes (Signed)
Hearing Screening   Method: Audiometry   125Hz  250Hz  500Hz  1000Hz  2000Hz  3000Hz  4000Hz  6000Hz  8000Hz   Right ear:   20 20 20   0    Left ear:   25 25 20  25     Vision Screening Comments: Appt later this month

## 2020-03-10 NOTE — Progress Notes (Signed)
Subjective:    Patient ID: Daniel Kline, male    DOB: 1946-01-31, 74 y.o.   MRN: [id number]  HPI Here for Medicare wellness visit and follow up of chronic health conditions This visit occurred during the SARS-CoV-2 public health emergency.  Safety protocols were in place, including screening questions prior to the visit, additional usage of staff PPE, and extensive cleaning of exam room while observing appropriate contact time as indicated for disinfecting solutions.   Reviewed form and advanced directives Reviewed other doctors No alcohol or tobacco products Vision okay Hearing is fine--no functional problems. Some chronic tinnitus Did have a fall ---door got stuck on his building, and he went backwards trying to loosen it. Slight bruise--but no sig injury No depression or anhedonia Independent with instrumental ADLs No sig memory problems  Did well with the right TKR Did have excessive bleeding after the surgery--had to go back in 2 days later Now it is healed well  Some pain--continues with PT  Surgeon has been giving analgesics since surgery This is helping his back as well Walks without cane at times----if stable surface  No heart problems No chest pain or SOB No dizziness or syncope No palpitations  No sig ankle swelling now (did have some on right post op)  Hasn't been checking sugars as much Still seem to be fairly level No hypoglycemic reactions Tolerating the liraglutide  Hasn't seen Dr Juleen China for some time Last GFR 37 Is on losartan  Getting new rheumatologist Continues on the lefunomide  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 amLODipine (NORVASC) 10 MG tablet TAKE 1 TABLET BY MOUTH  DAILY (Patient taking differently: Take 10 mg by mouth daily. ) 90 tablet 3  . carvedilol (COREG) 12.5 MG tablet Take 1 tablet  (12.5 mg total) by mouth 2 (two) times daily. 180 tablet 3  . celecoxib (CELEBREX) 200 MG capsule Take 1 capsule (200 mg total) by mouth 2 (two) times daily. 90 capsule 0  . Cholecalciferol (VITAMIN D-3 PO) Take 10,000 Units by mouth daily.     . clobetasol cream (TEMOVATE) 2.42 % Apply 1 application topically 2 (two) times daily as needed (irritation).     . clorazepate (TRANXENE) 7.5 MG tablet TAKE 1 TABLET (7.5 MG TOTAL) BY MOUTH 2 (TWO) TIMES DAILY AS NEEDED FOR ANXIETY. 60 tablet 0  . Elastic Bandages & Supports (MEDICAL COMPRESSION STOCKINGS) MISC 2 Units by Does not apply route daily. 2 each 0  . EPINEPHrine 0.3 mg/0.3 mL IJ SOAJ injection Inject 0.3 mLs (0.3 mg total) into the muscle as needed for anaphylaxis. 1 each 5  . Ferrous Sulfate (IRON PO) Take 1 tablet by mouth at bedtime.    . furosemide (LASIX) 40 MG tablet Take 40 mg by mouth 2 (two) times daily.    Marland Kitchen gabapentin (NEURONTIN) 300 MG capsule TAKE 1 CAPSULE BY MOUTH 3  TIMES DAILY (Patient taking differently: Take 300 mg by mouth 3 (three) times daily. ) 270 capsule 3  . glipiZIDE (GLUCOTROL) 5 MG tablet TAKE ONE-HALF TABLET BY  MOUTH 2 TIMES DAILY BEFORE  A MEAL. (Patient taking differently: Take 2.5 mg by mouth 2 (two) times daily before a meal. ) 90 tablet 3  . HYDROcodone-acetaminophen (NORCO/VICODIN) 5-325 MG tablet Take 1-2 tablets by mouth every 4 (four) hours as needed for moderate pain (pain score 4-6).  50 tablet 0  . hydrOXYzine (ATARAX/VISTARIL) 25 MG tablet TAKE ONE TABLET EVERY EIGHT HOURS AS NEEDED (Patient taking differently: Take 25 mg by mouth every 8 (eight) hours as needed for anxiety. ) 90 tablet 3  . isosorbide mononitrate (IMDUR) 60 MG 24 hr tablet Take 60 mg by mouth daily before lunch.     . leflunomide (ARAVA) 20 MG tablet Take 20 mg by mouth daily.    Marland Kitchen liraglutide (VICTOZA) 18 MG/3ML SOPN Inject 0.2 mLs (1.2 mg total) into the skin daily. 2 pen 11  . losartan (COZAAR) 100 MG tablet TAKE ONE TABLET EVERY DAY  90 tablet 1  . metFORMIN (GLUCOPHAGE) 500 MG tablet TAKE 1 TABLET BY MOUTH TWO  TIMES DAILY (Patient taking differently: Take 500 mg by mouth 2 (two) times daily with a meal. ) 180 tablet 3  . Multiple Vitamins-Minerals (MULTIVITAL) tablet Take 1 tablet by mouth daily.      Glory Rosebush ULTRA test strip CHECK BLOOD SUGAR 3 TIMES DAILY AS DIRECTED 100 each 1  . rosuvastatin (CRESTOR) 20 MG tablet TAKE ONE TABLET EVERY DAY (Patient taking differently: Take 20 mg by mouth daily. ) 90 tablet 3  . timolol (TIMOPTIC) 0.5 % ophthalmic solution Apply 1 drop to eye 2 (two) times daily.     No current facility-administered medications on file prior to visit.    Allergies  Allergen Reactions  . Bee Venom Anaphylaxis  . Oxycodone Other (See Comments)    Delusions Delusions   Delusions  . Hydromorphone Other (See Comments)    hallucinating  . Zolpidem Other (See Comments)    Past Medical History:  Diagnosis Date  . Anemia    H/O  . Anxiety   . Arthritis   . Chronic kidney disease   . Complication of anesthesia   . 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 Diabetes mellitus without complication ([place])   . Dysrhythmia    A FLUTTER  . Family history of adverse reaction to anesthesia    PT WAS ADOPTED  . HLD (hyperlipidemia)   . HTN (hypertension)   . HTN (hypertension)   . Hx of dysplastic nevus 2019   multiple sites  . 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 MRSA (methicillin resistant Staphylococcus aureus)    after back surgery  . OSA (obstructive sleep apnea)    USES BIPAP  . Polio   . 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 PONV (postoperative nausea and vomiting)     Past Surgical History:  Procedure Laterality Date  . BACK SURGERY     LUMBAR  . CARDIAC ELECTROPHYSIOLOGY STUDY AND ABLATION  2019  . COLONOSCOPY WITH PROPOFOL N/A 04/04/2019   Procedure: COLONOSCOPY WITH PROPOFOL;  Surgeon: Toledo, Benay Pike, MD;  Location: [facility]  ENDOSCOPY;  Service: Gastroenterology;  Laterality: N/A;  . I & D EXTREMITY Right 02/01/2020   Procedure: IRRIGATION AND DEBRIDEMENT EXTREMITY with poly exchange;  Surgeon: Dereck Leep, MD;  Location: [facility] ORS;  Service: Orthopedics;  Laterality: Right;  . INCISION AND DRAINAGE     BACK-MRSA INFECTION AFTER BACK SURGERY  . KNEE ARTHROPLASTY Right 01/28/2020   Procedure: COMPUTER ASSISTED TOTAL KNEE ARTHROPLASTY;  Surgeon: Dereck Leep, MD;  Location: [facility] ORS;  Service: Orthopedics;  Laterality: Right;  . MOUTH SURGERY    . right elbow surgery    . right knee surgery    . right shoulder surgery  from polio damage  . TONSILLECTOMY    . TOTAL HIP ARTHROPLASTY Bilateral 04/2016    Family History  Adopted: Yes    Social History   Socioeconomic History  . Marital status: Married    Spouse name: Not on file  . Number of children: 0  . Years of education: Not on file  . Highest education level: Not on file  Occupational History  . 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 Occupation: Cytogeneticist    Comment: Retired  . Occupation: Warden/ranger    Comment: Retired  Tobacco Use  . Smoking status: Former Smoker    Types: Cigars    Quit date: 09/23/1990    Years since quitting: 29.4  . Smokeless tobacco: Former Systems developer    Quit date: 02/25/2006  Vaping Use  . Vaping Use: Never used  Substance and Sexual Activity  . Alcohol use: No    Alcohol/week: 0.0 standard drinks  . Drug use: No  . Sexual activity: Yes    Partners: Female  Other Topics Concern  . Not on file  Social History Narrative   Has living will   Wife is health care POA---then brother or sister   Would accept resuscitation attempts but no prolonged ventilation or tube feeds   Social Determinants of Health   Financial Resource Strain:   . Difficulty of Paying Living Expenses:   Food Insecurity:   . Worried About Charity fundraiser in the  Last Year:   . Arboriculturist in the Last Year:   Transportation Needs:   . Film/video editor (Medical):   Marland Kitchen Lack of Transportation (Non-Medical):   Physical Activity:   . Days of Exercise per Week:   . Minutes of Exercise per Session:   Stress:   . Feeling of Stress :   Social Connections:   . Frequency of Communication with Friends and Family:   . Frequency of Social Gatherings with Friends and Family:   . Attends Religious Services:   . Active Member of Clubs or Organizations:   . Attends Archivist Meetings:   Marland Kitchen Marital Status:   Intimate Partner Violence:   . Fear of Current or Ex-Partner:   . Emotionally Abused:   Marland Kitchen Physically Abused:   . Sexually Abused:    Review of Systems Hasn't been using the CPAP lately--but still seems to be okay. Will probably resume Appetite is fine Was lost a little more weight Wears seat belt Full plates top and bottom No other skin issues Occasional heartburn--uses rolaids prn. No dysphagia Bowels are fine--no blood. No other significant joint pains    Objective:   Physical Exam Constitutional:      Appearance: Normal appearance.  HENT:     Head: Normocephalic and atraumatic.     Mouth/Throat:     Comments: No lesions Eyes:     Conjunctiva/sclera: Conjunctivae normal.     Pupils: Pupils are equal, round, and reactive to light.  Cardiovascular:     Rate and Rhythm: Normal rate and regular rhythm.     Pulses: Normal pulses.     Heart sounds: No murmur heard.  No gallop.   Pulmonary:     Effort: Pulmonary effort is normal.     Breath sounds: Normal breath sounds. No wheezing or rales.  Abdominal:     Palpations: Abdomen is soft.     Tenderness: There is no abdominal tenderness.  Musculoskeletal:  Cervical back: Neck supple.     Right lower leg: No edema.     Left lower leg: No edema.     Comments: Limited use of right shoulder--chronic  Lymphadenopathy:     Cervical: No cervical adenopathy.  Skin:     Comments: Scattered excoriated areas with dry eschar  Neurological:     Mental Status: He is alert and oriented to person, place, and time.     Comments: President--- "Waymond Cera, Obama" [phone]- "I need a piece of paper" [id number] Recall 3/3  Psychiatric:        Mood and Affect: Mood normal.        Behavior: Behavior normal.            Assessment & Plan:

## 2020-03-10 NOTE — Assessment & Plan Note (Signed)
Lab Results  Component Value Date   HGBA1C 7.8 (H) 01/21/2020   Better with the liraglutide Will recheck next time Metformin and glipizide as well

## 2020-03-11 DIAGNOSIS — Z96651 Presence of right artificial knee joint: Secondary | ICD-10-CM | POA: Diagnosis not present

## 2020-03-11 LAB — RENAL FUNCTION PANEL
Albumin: 4.4 g/dL (ref 3.5–5.2)
BUN: 20 mg/dL (ref 6–23)
CO2: 31 mEq/L (ref 19–32)
Calcium: 9.9 mg/dL (ref 8.4–10.5)
Chloride: 103 mEq/L (ref 96–112)
Creatinine, Ser: 1.54 mg/dL — ABNORMAL HIGH (ref 0.40–1.50)
GFR: 44.2 mL/min — ABNORMAL LOW (ref >60.00)
Glucose, Bld: 74 mg/dL (ref 70–99)
Phosphorus: 4.4 mg/dL (ref 2.3–4.6)
Potassium: 4.5 mEq/L (ref 3.5–5.1)
Sodium: 138 mEq/L (ref 135–145)

## 2020-03-11 LAB — LIPID PANEL
Cholesterol: 100 mg/dL (ref 0–200)
HDL: 31.9 mg/dL — ABNORMAL LOW (ref >39.00)
LDL Cholesterol: 38 mg/dL (ref 0–99)
NonHDL: 67.75
Total CHOL/HDL Ratio: 3
Triglycerides: 148 mg/dL (ref 0.0–149.0)
VLDL: 29.6 mg/dL (ref 0.0–40.0)

## 2020-03-11 LAB — HEPATIC FUNCTION PANEL
ALT: 13 U/L (ref 0–53)
AST: 15 U/L (ref 0–37)
Albumin: 4.4 g/dL (ref 3.5–5.2)
Alkaline Phosphatase: 72 U/L (ref 39–117)
Bilirubin, Direct: 0.1 mg/dL (ref 0.0–0.3)
Total Bilirubin: 0.4 mg/dL (ref 0.2–1.2)
Total Protein: 6.6 g/dL (ref 6.0–8.3)

## 2020-03-11 LAB — CBC
HCT: 36.2 % — ABNORMAL LOW (ref 39.0–52.0)
Hemoglobin: 11.7 g/dL — ABNORMAL LOW (ref 13.0–17.0)
MCHC: 32.3 g/dL (ref 30.0–36.0)
MCV: 87.2 fl (ref 78.0–100.0)
Platelets: 190 10*3/uL (ref 150.0–400.0)
RBC: 4.15 Mil/uL — ABNORMAL LOW (ref 4.22–5.81)
RDW: 16 % — ABNORMAL HIGH (ref 11.5–15.5)
WBC: 4.9 10*3/uL (ref 4.0–10.5)

## 2020-03-11 LAB — PARATHYROID HORMONE, INTACT (NO CA): PTH: 74 pg/mL — ABNORMAL HIGH (ref 14–64)

## 2020-03-11 LAB — VITAMIN D 25 HYDROXY (VIT D DEFICIENCY, FRACTURES): VITD: 68.07 ng/mL (ref 30.00–100.00)

## 2020-03-12 ENCOUNTER — Other Ambulatory Visit: Payer: Self-pay | Admitting: Internal Medicine

## 2020-03-12 NOTE — Telephone Encounter (Signed)
Ortho is no longer writing this.  Name of Medication: Hydrocodone Name of Pharmacy: [facility] or Written Date and Quantity: 02-20-20 #30 (Acute Rx from ortho) Last Office Visit and Type: 03-10-20 Next Office Visit and Type: 06-13-20 Last Controlled Substance Agreement Date: 05-18-19 Last UDS: 05-18-19

## 2020-04-02 DIAGNOSIS — I129 Hypertensive chronic kidney disease with stage 1 through stage 4 chronic kidney disease, or unspecified chronic kidney disease: Secondary | ICD-10-CM | POA: Insufficient documentation

## 2020-04-02 DIAGNOSIS — D649 Anemia, unspecified: Secondary | ICD-10-CM | POA: Insufficient documentation

## 2020-04-02 DIAGNOSIS — R809 Proteinuria, unspecified: Secondary | ICD-10-CM | POA: Insufficient documentation

## 2020-04-02 DIAGNOSIS — D631 Anemia in chronic kidney disease: Secondary | ICD-10-CM | POA: Insufficient documentation

## 2020-04-02 DIAGNOSIS — N2581 Secondary hyperparathyroidism of renal origin: Secondary | ICD-10-CM | POA: Insufficient documentation

## 2020-04-02 DIAGNOSIS — N189 Chronic kidney disease, unspecified: Secondary | ICD-10-CM | POA: Insufficient documentation

## 2020-04-02 DIAGNOSIS — N2889 Other specified disorders of kidney and ureter: Secondary | ICD-10-CM | POA: Insufficient documentation

## 2020-04-02 DIAGNOSIS — D638 Anemia in other chronic diseases classified elsewhere: Secondary | ICD-10-CM | POA: Insufficient documentation

## 2020-04-10 ENCOUNTER — Other Ambulatory Visit: Payer: Self-pay | Admitting: Internal Medicine

## 2020-04-21 ENCOUNTER — Other Ambulatory Visit: Payer: Self-pay | Admitting: Internal Medicine

## 2020-04-21 NOTE — Telephone Encounter (Signed)
Last filled 01-21-20 #60 Last OV 03-10-20 Next OV 06-13-20 [facility]

## 2020-04-22 ENCOUNTER — Other Ambulatory Visit: Payer: Self-pay | Admitting: Internal Medicine

## 2020-04-22 NOTE — Telephone Encounter (Signed)
Name of Medication: Hydrocodone Name of Pharmacy: [facility] or Written Date and Quantity: 03-12-20 #60 Last Office Visit and Type: 03-10-20 Next Office Visit and Type: 06-13-20 Last Controlled Substance Agreement Date: 05-18-19 Last UDS: 05-18-19

## 2020-05-05 DIAGNOSIS — G4733 Obstructive sleep apnea (adult) (pediatric): Secondary | ICD-10-CM | POA: Diagnosis not present

## 2020-05-06 DIAGNOSIS — C8400 Mycosis fungoides, unspecified site: Secondary | ICD-10-CM | POA: Diagnosis not present

## 2020-05-06 DIAGNOSIS — C8410 Sezary disease, unspecified site: Secondary | ICD-10-CM | POA: Diagnosis not present

## 2020-05-06 DIAGNOSIS — L308 Other specified dermatitis: Secondary | ICD-10-CM | POA: Diagnosis not present

## 2020-05-06 DIAGNOSIS — L986 Other infiltrative disorders of the skin and subcutaneous tissue: Secondary | ICD-10-CM | POA: Diagnosis not present

## 2020-05-06 DIAGNOSIS — D485 Neoplasm of uncertain behavior of skin: Secondary | ICD-10-CM | POA: Diagnosis not present

## 2020-05-06 DIAGNOSIS — C84A0 Cutaneous T-cell lymphoma, unspecified, unspecified site: Secondary | ICD-10-CM | POA: Diagnosis not present

## 2020-05-06 DIAGNOSIS — Z79899 Other long term (current) drug therapy: Secondary | ICD-10-CM | POA: Diagnosis not present

## 2020-05-06 DIAGNOSIS — L83 Acanthosis nigricans: Secondary | ICD-10-CM | POA: Diagnosis not present

## 2020-05-06 DIAGNOSIS — Z87891 Personal history of nicotine dependence: Secondary | ICD-10-CM | POA: Diagnosis not present

## 2020-05-21 ENCOUNTER — Telehealth: Payer: Self-pay

## 2020-05-21 DIAGNOSIS — Z4802 Encounter for removal of sutures: Secondary | ICD-10-CM | POA: Diagnosis not present

## 2020-05-21 NOTE — Telephone Encounter (Signed)
I spoke with pts wife (DPR signed) and pt is at [facility] in [place] having the stitches looked at now. FYI to Dr Silvio Pate.

## 2020-05-21 NOTE — Telephone Encounter (Signed)
[name] - Client TELEPHONE ADVICE RECORD [facility] Patient Name: Daniel Kline Gender: Male DOB: 07-18-1946 Age: 74 Y 108 M 13 D Return Phone Number: [phone] (Primary) Address: City/State/Zip: [place] [address] Client [facility] - Client Client Site [facility] - Day Physician Viviana Simpler- MD Contact Type Call Who Is Calling Patient / Member / Family / Caregiver Call Type Triage / Clinical Relationship To Patient Self Return Phone Number [phone] (Primary) Chief Complaint Post-op Incision Symptoms Reason for Call Symptomatic / Request for [place] has skin growing over his stitches. He had them put in two weeks ago. Translation No Nurse Assessment Nurse: Harlow Mares, RN, Suanne Marker Date/Time Eilene Ghazi Time): 05/21/2020 1:26:42 PM Confirm and document reason for call. If symptomatic, describe symptoms. ---Caller has skin growing over his stitches. He had them put in two weeks ago. Does the patient have any new or worsening symptoms? ---Yes Will a triage be completed? ---Yes Related visit to physician within the last 2 weeks? ---Yes Does the PT have any chronic conditions? (i.e. diabetes, asthma, this includes High risk factors for pregnancy, etc.) ---Unknown Is this a behavioral health or substance abuse call? ---No Guidelines Guideline Title Affirmed Question Affirmed Notes Nurse Date/Time (Eastern Time) Suture or Staple Questions Suture or staple removal is overdue Harlow Mares, Therapist, sports, Rhonda 05/21/2020 1:28:22 PM Disp. Time Eilene Ghazi Time) Disposition Final User 05/21/2020 1:37:00 PM See PCP within 24 Hours Yes Harlow Mares, RN, Rosalyn Charters Disagree/Comply Comply Caller Understands Yes PreDisposition Did not know what to do Care Advice Given Per Guideline PLEASE NOTE: All timestamps contained within this report are represented as [place] Standard  Time. CONFIDENTIALTY NOTICE: This fax transmission is intended only for the addressee. It contains information that is legally privileged, confidential or otherwise protected from use or disclosure. If you are not the intended recipient, you are strictly prohibited from reviewing, disclosing, copying using or disseminating any of this information or taking any action in reliance on or regarding this information. If you have received this fax in error, please notify us immediately by telephone so that we can arrange for its return to [place]. Phone: [phone], Toll-Free: [phone], Fax: [phone] Page: 2 of 2 Call Id: [id number] Care Advice Given Per Guideline SEE PCP WITHIN 24 HOURS: * IF OFFICE WILL BE OPEN: You need to be examined within the next 24 hours. Call your doctor (or NP/PA) when the office opens and make an appointment. SUTURE REMOVAL: * You need to have the sutures removed. CALL BACK IF: * Looks infected (e.g., increasing redness, tenderness, pus-like drainage) * Fever occurs * You become worse CARE ADVICE given per Suture Questions (Adult) guideline Comments User: Tamala Fothergill, RN Date/Time Eilene Ghazi Time): 05/21/2020 1:37:13 PM No appts in office. Referrals GO TO FACILITY UNDECIDED

## 2020-05-22 NOTE — Telephone Encounter (Signed)
That is not surprising------ generally you don't leave sutures in more than 7-10 days. Make sure he got this taken care of

## 2020-05-24 NOTE — Telephone Encounter (Signed)
Left message to call office Monday.

## 2020-05-26 ENCOUNTER — Other Ambulatory Visit: Payer: Self-pay | Admitting: Family Medicine

## 2020-05-26 DIAGNOSIS — I10 Essential (primary) hypertension: Secondary | ICD-10-CM

## 2020-05-27 ENCOUNTER — Other Ambulatory Visit: Payer: Self-pay | Admitting: Internal Medicine

## 2020-05-27 NOTE — Telephone Encounter (Signed)
Name of Medication: Hydrocodone Name of Pharmacy: [facility] or Written Date and Quantity: 04-22-20 #60 Last Office Visit and Type: 03-10-20 Next Office Visit and Type: 06-13-20 Last Controlled Substance Agreement Date: 05-18-19 Last UDS: 05-18-19

## 2020-05-28 DIAGNOSIS — B351 Tinea unguium: Secondary | ICD-10-CM | POA: Diagnosis not present

## 2020-05-28 DIAGNOSIS — E1142 Type 2 diabetes mellitus with diabetic polyneuropathy: Secondary | ICD-10-CM | POA: Diagnosis not present

## 2020-05-29 NOTE — Telephone Encounter (Signed)
Left message to call office

## 2020-06-10 DIAGNOSIS — Z79899 Other long term (current) drug therapy: Secondary | ICD-10-CM | POA: Diagnosis not present

## 2020-06-10 DIAGNOSIS — C84A0 Cutaneous T-cell lymphoma, unspecified, unspecified site: Secondary | ICD-10-CM | POA: Diagnosis not present

## 2020-06-13 ENCOUNTER — Ambulatory Visit (INDEPENDENT_AMBULATORY_CARE_PROVIDER_SITE_OTHER): Payer: Medicare Other | Admitting: Internal Medicine

## 2020-06-13 ENCOUNTER — Other Ambulatory Visit: Payer: Self-pay

## 2020-06-13 ENCOUNTER — Other Ambulatory Visit: Payer: Self-pay | Admitting: Internal Medicine

## 2020-06-13 ENCOUNTER — Encounter: Payer: Self-pay | Admitting: Internal Medicine

## 2020-06-13 VITALS — BP 136/86 | HR 91 | Temp 98.2°F | Ht 66.0 in | Wt 235.0 lb

## 2020-06-13 DIAGNOSIS — N1832 Chronic kidney disease, stage 3b: Secondary | ICD-10-CM

## 2020-06-13 DIAGNOSIS — G894 Chronic pain syndrome: Secondary | ICD-10-CM

## 2020-06-13 DIAGNOSIS — G8929 Other chronic pain: Secondary | ICD-10-CM | POA: Insufficient documentation

## 2020-06-13 DIAGNOSIS — F1120 Opioid dependence, uncomplicated: Secondary | ICD-10-CM | POA: Diagnosis not present

## 2020-06-13 DIAGNOSIS — E1121 Type 2 diabetes mellitus with diabetic nephropathy: Secondary | ICD-10-CM

## 2020-06-13 LAB — POCT GLYCOSYLATED HEMOGLOBIN (HGB A1C): Hemoglobin A1C: 7.7 % — AB (ref 4.0–5.6)

## 2020-06-13 NOTE — Assessment & Plan Note (Addendum)
On glipizide, metformin and liraglutide Some sugars in 60's and 70's without hypoglycemic symptoms Will consider cutting the glipizide  Lab Results  Component Value Date   HGBA1C 7.7 (A) 06/13/2020   About the same and acceptable Will hold off on any med changes

## 2020-06-13 NOTE — Assessment & Plan Note (Signed)
Continues on losartan

## 2020-06-13 NOTE — Assessment & Plan Note (Signed)
Ongoing back and other joint pains Uses the hydrocodone regularly Due for UDS

## 2020-06-13 NOTE — Assessment & Plan Note (Signed)
PDMP reviewed No concerns 

## 2020-06-13 NOTE — Telephone Encounter (Signed)
Pt was seen in the office today.  

## 2020-06-13 NOTE — Assessment & Plan Note (Signed)
BMI 37 with diabetes, GERD, HTN, etc Discussed lifestyle adherence

## 2020-06-13 NOTE — Progress Notes (Signed)
Subjective:    Patient ID: Daniel Kline, male    DOB: May 15, 1946, 74 y.o.   MRN: [id number]  HPI Here for follow up of chronic pain and narcotic dependence. Also his diabetes This visit occurred during the SARS-CoV-2 public health emergency.  Safety protocols were in place, including screening questions prior to the visit, additional usage of staff PPE, and extensive cleaning of exam room while observing appropriate contact time as indicated for disinfecting solutions.   Ongoing chronic pain--back/knees, etc Using the hydrocodone once or twice a day Satisfied with this  Checks sugars--but then ran out of strips AM sugars often under 100 No low sugar reactions Last GFR 44 Has seen Dr Juleen China again  No chest pain No palpitations  Current Outpatient Medications on File Prior to Visit  Medication Sig Dispense Refill  . amLODipine (NORVASC) 10 MG tablet TAKE 1 TABLET BY MOUTH  DAILY (Patient taking differently: Take 10 mg by mouth daily. ) 90 tablet 3  . celecoxib (CELEBREX) 200 MG capsule Take 1 capsule (200 mg total) by mouth 2 (two) times daily. 90 capsule 0  . Cholecalciferol (VITAMIN D-3 PO) Take 10,000 Units by mouth daily.     . clobetasol cream (TEMOVATE) 4.12 % Apply 1 application topically 2 (two) times daily as needed (irritation).     . clorazepate (TRANXENE) 7.5 MG tablet TAKE 1 TABLET (7.5 MG TOTAL) BY MOUTH 2 (TWO) TIMES DAILY AS NEEDED FOR ANXIETY. 60 tablet 0  . Elastic Bandages & Supports (MEDICAL COMPRESSION STOCKINGS) MISC 2 Units by Does not apply route daily. 2 each 0  . EPINEPHrine 0.3 mg/0.3 mL IJ SOAJ injection Inject 0.3 mLs (0.3 mg total) into the muscle as needed for anaphylaxis. 1 each 5  . Ferrous Sulfate (IRON PO) Take 1 tablet by mouth at bedtime.    . folic acid (FOLVITE) 1 MG tablet Take 1 mg by mouth daily.    . furosemide (LASIX) 40 MG tablet Take 40 mg by mouth 2 (two) times daily.    Marland Kitchen gabapentin (NEURONTIN) 300 MG capsule TAKE 1 CAPSULE BY  MOUTH 3  TIMES DAILY (Patient taking differently: Take 300 mg by mouth 3 (three) times daily. ) 270 capsule 3  . glipiZIDE (GLUCOTROL) 5 MG tablet TAKE ONE-HALF TABLET BY  MOUTH 2 TIMES DAILY BEFORE  A MEAL. (Patient taking differently: Take 2.5 mg by mouth 2 (two) times daily before a meal. ) 90 tablet 3  . HYDROcodone-acetaminophen (NORCO/VICODIN) 5-325 MG tablet TAKE 1 TABLET BY MOUTH EVERY 6 HOURS AS NEEDED FOR PAIN. 60 tablet 0  . hydrOXYzine (ATARAX/VISTARIL) 25 MG tablet TAKE ONE TABLET EVERY EIGHT HOURS AS NEEDED 90 tablet 3  . isosorbide mononitrate (IMDUR) 60 MG 24 hr tablet Take 60 mg by mouth daily before lunch.     . liraglutide (VICTOZA) 18 MG/3ML SOPN Inject 0.2 mLs (1.2 mg total) into the skin daily. 2 pen 11  . losartan (COZAAR) 100 MG tablet TAKE ONE TABLET EVERY DAY 90 tablet 1  . metFORMIN (GLUCOPHAGE) 500 MG tablet TAKE 1 TABLET BY MOUTH TWO  TIMES DAILY (Patient taking differently: Take 500 mg by mouth 2 (two) times daily with a meal. ) 180 tablet 3  . methotrexate 2.5 MG tablet Take 6 tablets by mouth once a week.    . Multiple Vitamins-Minerals (MULTIVITAL) tablet Take 1 tablet by mouth daily.      Glory Rosebush ULTRA test strip CHECK BLOOD SUGAR 3 TIMES DAILY AS DIRECTED 100 each 1  .  rosuvastatin (CRESTOR) 20 MG tablet TAKE ONE TABLET EVERY DAY (Patient taking differently: Take 20 mg by mouth daily. ) 90 tablet 3  . timolol (TIMOPTIC) 0.5 % ophthalmic solution Apply 1 drop to eye 2 (two) times daily.    . carvedilol (COREG) 12.5 MG tablet Take 1 tablet (12.5 mg total) by mouth 2 (two) times daily. 180 tablet 3   No current facility-administered medications on file prior to visit.    Allergies  Allergen Reactions  . Bee Venom Anaphylaxis  . Oxycodone Other (See Comments)    Delusions Delusions   Delusions  . Hydromorphone Other (See Comments)    hallucinating  . Zolpidem Other (See Comments)    Past Medical History:  Diagnosis Date  . Anemia    H/O  . Anxiety    . Arthritis   . Chronic kidney disease   . Complication of anesthesia   . 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 Diabetes mellitus without complication ([place])   . Dysrhythmia    A FLUTTER  . Family history of adverse reaction to anesthesia    PT WAS ADOPTED  . HLD (hyperlipidemia)   . HTN (hypertension)   . HTN (hypertension)   . Hx of dysplastic nevus 2019   multiple sites  . 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 MRSA (methicillin resistant Staphylococcus aureus)    after back surgery  . OSA (obstructive sleep apnea)    USES BIPAP  . Polio   . 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 PONV (postoperative nausea and vomiting)     Past Surgical History:  Procedure Laterality Date  . BACK SURGERY     LUMBAR  . CARDIAC ELECTROPHYSIOLOGY STUDY AND ABLATION  2019  . COLONOSCOPY WITH PROPOFOL N/A 04/04/2019   Procedure: COLONOSCOPY WITH PROPOFOL;  Surgeon: Toledo, Benay Pike, MD;  Location: [facility] ENDOSCOPY;  Service: Gastroenterology;  Laterality: N/A;  . I & D EXTREMITY Right 02/01/2020   Procedure: IRRIGATION AND DEBRIDEMENT EXTREMITY with poly exchange;  Surgeon: Dereck Leep, MD;  Location: [facility] ORS;  Service: Orthopedics;  Laterality: Right;  . INCISION AND DRAINAGE     BACK-MRSA INFECTION AFTER BACK SURGERY  . KNEE ARTHROPLASTY Right 01/28/2020   Procedure: COMPUTER ASSISTED TOTAL KNEE ARTHROPLASTY;  Surgeon: Dereck Leep, MD;  Location: [facility] ORS;  Service: Orthopedics;  Laterality: Right;  . MOUTH SURGERY    . right elbow surgery    . right knee surgery    . right shoulder surgery     from polio damage  . TONSILLECTOMY    . TOTAL HIP ARTHROPLASTY Bilateral 04/2016    Family History  Adopted: Yes    Social History   Socioeconomic History  . Marital status: Married    Spouse name: Not on file  . Number of children: 0  . Years of education: Not on file  . Highest education level: Not on file  Occupational History  .  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 Occupation: Cytogeneticist    Comment: Retired  . Occupation: Warden/ranger    Comment: Retired  Tobacco Use  . Smoking status: Former Smoker    Types: Cigars    Quit date: 09/23/1990    Years since quitting: 29.7  . Smokeless tobacco: Former Systems developer    Quit date: 02/25/2006  Vaping Use  . Vaping Use: Never used  Substance and Sexual Activity  .  Alcohol use: No    Alcohol/week: 0.0 standard drinks  . Drug use: No  . Sexual activity: Yes    Partners: Female  Other Topics Concern  . Not on file  Social History Narrative   Has living will   Wife is health care POA---then brother or sister   Would accept resuscitation attempts but no prolonged ventilation or tube feeds   Social Determinants of Health   Financial Resource Strain:   . Difficulty of Paying Living Expenses: Not on file  Food Insecurity:   . Worried About Charity fundraiser in the Last Year: Not on file  . Ran Out of Food in the Last Year: Not on file  Transportation Needs:   . Lack of Transportation (Medical): Not on file  . Lack of Transportation (Non-Medical): Not on file  Physical Activity:   . Days of Exercise per Week: Not on file  . Minutes of Exercise per Session: Not on file  Stress:   . Feeling of Stress : Not on file  Social Connections:   . Frequency of Communication with Friends and Family: Not on file  . Frequency of Social Gatherings with Friends and Family: Not on file  . Attends Religious Services: Not on file  . Active Member of Clubs or Organizations: Not on file  . Attends Archivist Meetings: Not on file  . Marital Status: Not on file  Intimate Partner Violence:   . Fear of Current or Ex-Partner: Not on file  . Emotionally Abused: Not on file  . Physically Abused: Not on file  . Sexually Abused: Not on file   Review of Systems  Did go to [facility] dermatology---long walk and he  was able to make it Sleeps well Appetite is off some Weight up slightly--discussed care with eating, etc Taking "Balance of Nature"----fruits and vegetables in pill     Objective:   Physical Exam Constitutional:      Appearance: Normal appearance.  Cardiovascular:     Rate and Rhythm: Normal rate and regular rhythm.     Heart sounds: No murmur heard.  No gallop.   Pulmonary:     Effort: Pulmonary effort is normal.     Breath sounds: Normal breath sounds. No wheezing or rales.  Musculoskeletal:     Cervical back: Neck supple.     Right lower leg: No edema.     Left lower leg: No edema.  Lymphadenopathy:     Cervical: No cervical adenopathy.  Neurological:     Mental Status: He is alert.            Assessment & Plan:

## 2020-06-15 LAB — DRUG MONITORING, PANEL 8 WITH CONFIRMATION, URINE
6 Acetylmorphine: NEGATIVE ng/mL (ref <10)
Alcohol Metabolites: NEGATIVE ng/mL
Alphahydroxyalprazolam: NEGATIVE ng/mL (ref <25)
Alphahydroxymidazolam: NEGATIVE ng/mL (ref <50)
Alphahydroxytriazolam: NEGATIVE ng/mL (ref <50)
Aminoclonazepam: NEGATIVE ng/mL (ref <25)
Amphetamines: NEGATIVE ng/mL (ref <500)
Benzodiazepines: POSITIVE ng/mL — AB (ref <100)
Buprenorphine, Urine: NEGATIVE ng/mL (ref <5)
Cocaine Metabolite: NEGATIVE ng/mL (ref <150)
Codeine: NEGATIVE ng/mL (ref <50)
Creatinine: 67.5 mg/dL
Hydrocodone: 110 ng/mL — ABNORMAL HIGH (ref <50)
Hydromorphone: 84 ng/mL — ABNORMAL HIGH (ref <50)
Hydroxyethylflurazepam: NEGATIVE ng/mL (ref <50)
Lorazepam: NEGATIVE ng/mL (ref <50)
MDMA: NEGATIVE ng/mL (ref <500)
Marijuana Metabolite: NEGATIVE ng/mL (ref <20)
Morphine: NEGATIVE ng/mL (ref <50)
Nordiazepam: NEGATIVE ng/mL (ref <50)
Norhydrocodone: 220 ng/mL — ABNORMAL HIGH (ref <50)
Opiates: POSITIVE ng/mL — AB (ref <100)
Oxazepam: 414 ng/mL — ABNORMAL HIGH (ref <50)
Oxidant: NEGATIVE ug/mL
Oxycodone: NEGATIVE ng/mL (ref <100)
Temazepam: NEGATIVE ng/mL (ref <50)
pH: 6.8 (ref 4.5–9.0)

## 2020-06-15 LAB — DM TEMPLATE

## 2020-06-16 DIAGNOSIS — C84A0 Cutaneous T-cell lymphoma, unspecified, unspecified site: Secondary | ICD-10-CM | POA: Diagnosis not present

## 2020-06-16 DIAGNOSIS — Z79899 Other long term (current) drug therapy: Secondary | ICD-10-CM | POA: Diagnosis not present

## 2020-06-25 DIAGNOSIS — C84A0 Cutaneous T-cell lymphoma, unspecified, unspecified site: Secondary | ICD-10-CM | POA: Diagnosis not present

## 2020-06-25 DIAGNOSIS — Z79899 Other long term (current) drug therapy: Secondary | ICD-10-CM | POA: Diagnosis not present

## 2020-06-25 DIAGNOSIS — N1832 Chronic kidney disease, stage 3b: Secondary | ICD-10-CM | POA: Diagnosis not present

## 2020-06-25 DIAGNOSIS — M0609 Rheumatoid arthritis without rheumatoid factor, multiple sites: Secondary | ICD-10-CM | POA: Diagnosis not present

## 2020-06-30 ENCOUNTER — Telehealth: Payer: Self-pay

## 2020-06-30 MED ORDER — GABAPENTIN 300 MG PO CAPS: 300.0000 mg | ORAL_CAPSULE | Freq: Three times a day (TID) | ORAL | 0 refills | Status: DC

## 2020-06-30 NOTE — Telephone Encounter (Signed)
1 weeks worth until mail order rx arrives

## 2020-07-08 ENCOUNTER — Other Ambulatory Visit: Payer: Self-pay

## 2020-07-08 ENCOUNTER — Ambulatory Visit: Payer: Medicare Other | Admitting: Dermatology

## 2020-07-08 DIAGNOSIS — C84A0 Cutaneous T-cell lymphoma, unspecified, unspecified site: Secondary | ICD-10-CM

## 2020-07-08 DIAGNOSIS — B356 Tinea cruris: Secondary | ICD-10-CM

## 2020-07-08 MED ORDER — FLUCONAZOLE 150 MG PO TABS
150.0000 mg | ORAL_TABLET | ORAL | 0 refills | Status: DC
Start: 2020-07-08 — End: 2020-12-11

## 2020-07-08 NOTE — Progress Notes (Addendum)
   New Patient Visit  Subjective  Daniel Kline is a 74 y.o. male who presents for the following: Rash (Pt present for rash in the groin for 2 weeks treating with Triamcinolone cream, Clobetasol ointment.  He has known CTCL, being treated at [facility] by Dr. Irish Elders. He is using Clobetasol ointment at this time with some improvement. He just starting applying clobetasol ointment to the new rash area in the last day or two.   The following portions of the chart were reviewed this encounter and updated as appropriate:   Tobacco  Allergies  Meds  Problems  Med Hx  Surg Hx  Fam Hx      Review of Systems:  No other skin or systemic complaints except as noted in HPI or Assessment and Plan.  Objective  Well appearing patient in no apparent distress; mood and affect are within normal limits.  A focused examination was performed including face, chest, back, abdomen, arms, legs, groin. Relevant physical exam findings are noted in the Assessment and Plan.  Objective  Left Thigh - Anterior: Scaly red eroded plaques  Objective  groin, back, post legs, buttock: Scaly firm pink plaques with rare superficial ulcerations low back, buttock, upper legs, couple of deeper ulcerations at posterior legs               Assessment & Plan  Tinea cruris Left Thigh - Anterior  KOH positive   Severe. May also have component of CTCL at this site.   Start fluconazole 150 mg tablet, repeat in 3 days if he hasn't had significant improvement.   After 3 days, can start clobetasol ointment to these areas as well twice a day for up to 2 weeks, avoiding in crease.   Topical steroids (such as triamcinolone, fluocinolone, fluocinonide, mometasone, clobetasol, halobetasol, betamethasone, hydrocortisone) can cause thinning and lightening of the skin if they are used for too long in the same area. Your physician has selected the right strength medicine for your problem and area affected on the body. Please  use your medication only as directed by your physician to prevent side effects.    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  Continue clobetasol ointment. Avoid applying to face, groin, and axilla. Use as directed. Risk of skin atrophy with long-term use reviewed. 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  Reviewed records from [facility] and biopsy result.  Pt with inadequate response to clobetasol ointment (though I did not see what this looked like a few weeks ago so it is possible he's improving significantly). Will likely need further therapy.   Would suggest adding nbUVB therapy as a next step which we can offer here as it is closer than [facility] but will defer to Dr. Lavell Luster team.  Will see patient back in 2 weeks to check in on both conditions  Return in about 2 weeks (around 07/22/2020).  I, Marye Round, CMA, am acting as scribe for Forest Gleason, MD .  Documentation: I have reviewed the above documentation for accuracy and completeness, and I agree with the above.  Forest Gleason, MD

## 2020-07-10 IMAGING — DX DG KNEE 1-2V PORT*R*
2 series · 2 of 2 positions shown · non-contrast
Comparison: Right knee radiograph dated 06/10/2006.

CLINICAL DATA: 74-year-old male with right knee replacement.

EXAM:
PORTABLE RIGHT KNEE - 1-2 VIEW

[knee ap]
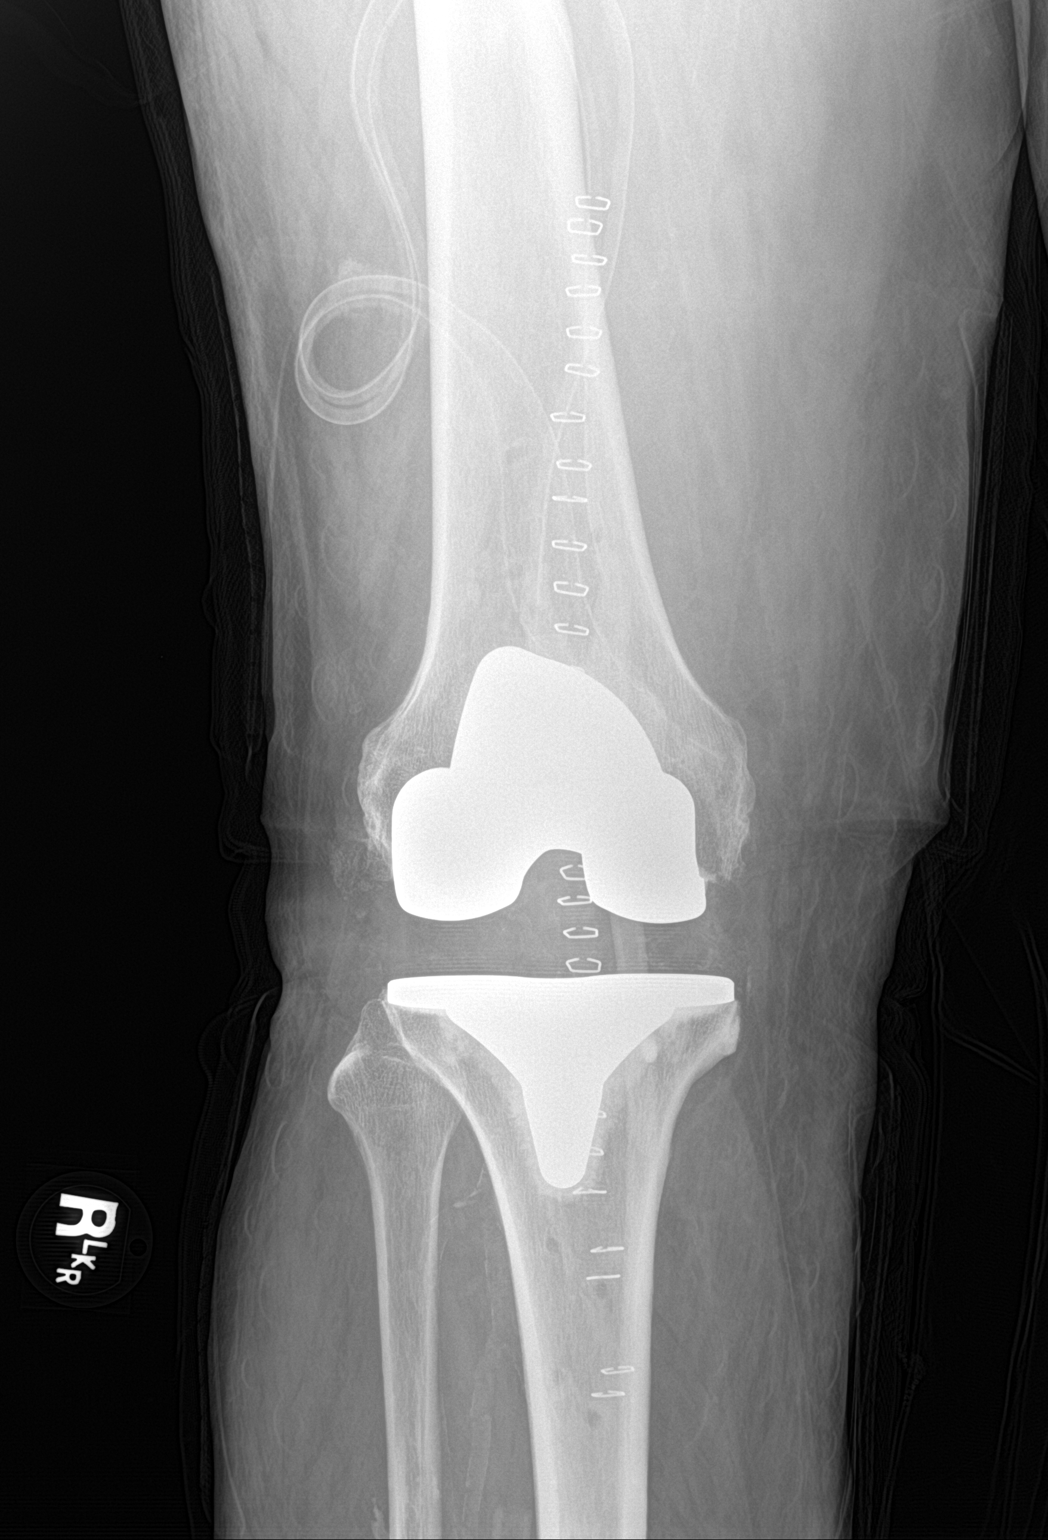

[knee lat]
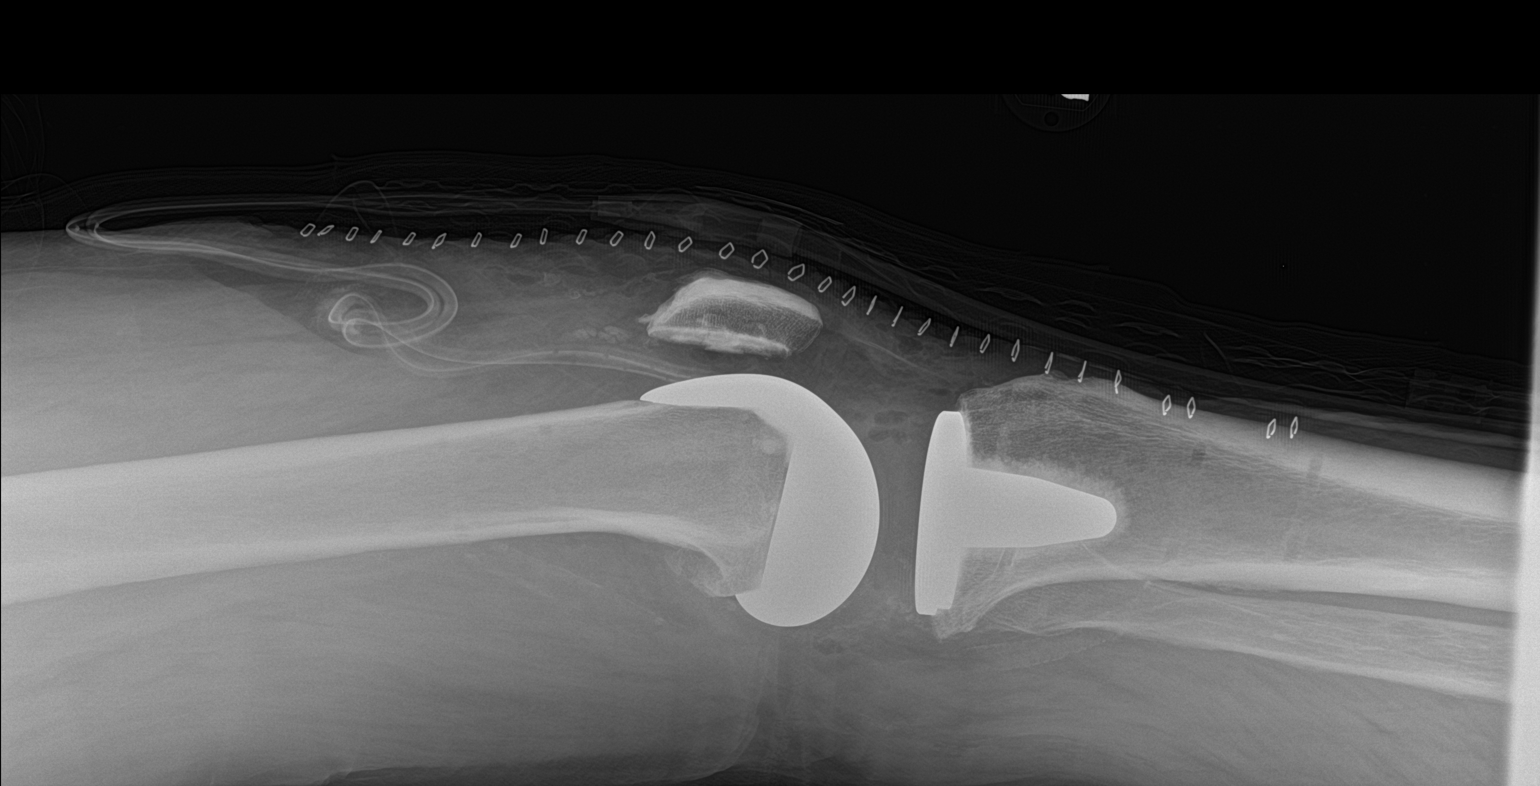

[2 of 2 positions shown; findings below may reference images not displayed]

FINDINGS: There is a total right knee arthroplasty. The arthroplasty
components appear intact and in anatomic alignment. There is no
acute fracture or dislocation. A drainage catheter is noted with tip
at the patellofemoral compartment. Postsurgical changes and anterior
knee cutaneous clips.
IMPRESSION: Status post total right knee arthroplasty.

## 2020-07-11 ENCOUNTER — Other Ambulatory Visit: Payer: Self-pay | Admitting: Internal Medicine

## 2020-07-11 NOTE — Telephone Encounter (Signed)
Last filled 04-21-20 #60 Last OV 06-13-20 Next OV 09-11-20 [facility]

## 2020-07-21 ENCOUNTER — Telehealth: Payer: Self-pay | Admitting: Dermatology

## 2020-07-21 ENCOUNTER — Encounter: Payer: Self-pay | Admitting: Dermatology

## 2020-07-21 ENCOUNTER — Other Ambulatory Visit: Payer: Self-pay | Admitting: Internal Medicine

## 2020-07-21 NOTE — Telephone Encounter (Signed)
Name of Medication: Hydrocodone Name of Pharmacy: [facility] or Written Date and Quantity: 05-27-20 #60 Last Office Visit and Type: 06-13-20 Next Office Visit and Type: 09-12-19 Last Controlled Substance Agreement Date: 05-18-19 Last UDS: 05-18-19

## 2020-07-21 NOTE — Telephone Encounter (Signed)
Can you please notify Dr. Lavell Luster team that there is a note and photos in our EMR for [name]? I am scheduled to see him back this week for recheck. Thank you!

## 2020-07-22 NOTE — Telephone Encounter (Signed)
Previous office notes and photos faxed to Dr Adelene Idler

## 2020-07-23 ENCOUNTER — Other Ambulatory Visit: Payer: Self-pay | Admitting: Internal Medicine

## 2020-07-24 ENCOUNTER — Other Ambulatory Visit: Payer: Self-pay

## 2020-07-24 ENCOUNTER — Ambulatory Visit (INDEPENDENT_AMBULATORY_CARE_PROVIDER_SITE_OTHER): Payer: Medicare Other | Admitting: Dermatology

## 2020-07-24 DIAGNOSIS — B356 Tinea cruris: Secondary | ICD-10-CM

## 2020-07-24 DIAGNOSIS — C84A0 Cutaneous T-cell lymphoma, unspecified, unspecified site: Secondary | ICD-10-CM

## 2020-07-24 MED ORDER — KETOCONAZOLE 2 % EX CREA
1.0000 | TOPICAL_CREAM | Freq: Two times a day (BID) | CUTANEOUS | 5 refills | Status: AC
Start: 2020-07-24 — End: 2020-09-04

## 2020-07-24 NOTE — Progress Notes (Signed)
° °  Follow-Up Visit   Subjective  Daniel Kline is a 74 y.o. male who presents for the following: Follow-up (2 week f/u for CTCL and tinea cruris. Pt was treated with 2 rounds of fluconazole 150 mg. He states that he used the clobetasol ointment once but then held off until f/u today. Pt reports much improvement since last visit. ).  The following portions of the chart were reviewed this encounter and updated as appropriate:  Tobacco   Allergies   Meds   Problems   Med Hx   Surg Hx   Fam Hx       Review of Systems: No other skin or systemic complaints except as noted in HPI or Assessment and Plan.   Objective  Well appearing patient in no apparent distress; mood and affect are within normal limits.  A focused examination was performed including legs, groin area, back. Relevant physical exam findings are noted in the Assessment and Plan.  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  Recommend restarting methotrexate for RA and CTCL per rheumatology and [facility] dermatology pending confirmation from dermatologist at [place] cruris Left Thigh - Anterior  Significant improved status post fluconazole.  Start ketoconazole cream BID to inguinal folds.  Call for worsening.   Recommend restarting methotrexate for RA and CTCL per rheumatology and [facility] dermatology pending confirmation from Dermatologist at [facility]  ketoconazole (NIZORAL) 2 % cream - Left Thigh - Anterior  Return in about 6 weeks (around 09/04/2020) for recheck CTCL.   I, Harriett Sine, CMA, am acting as scribe for Forest Gleason, MD.   Documentation: I have reviewed the above documentation for accuracy and completeness, and I agree with the above.  Forest Gleason, MD

## 2020-07-27 ENCOUNTER — Emergency Department: Payer: Medicare Other

## 2020-07-27 ENCOUNTER — Other Ambulatory Visit: Payer: Self-pay

## 2020-07-27 ENCOUNTER — Emergency Department
Admission: EM | Admit: 2020-07-27 | Discharge: 2020-07-27 | Disposition: A | Discharge status: Home / Self Care | Payer: Medicare Other | Attending: Emergency Medicine | Admitting: Emergency Medicine

## 2020-07-27 DIAGNOSIS — M542 Cervicalgia: Secondary | ICD-10-CM | POA: Diagnosis not present

## 2020-07-27 DIAGNOSIS — Z7984 Long term (current) use of oral hypoglycemic drugs: Secondary | ICD-10-CM | POA: Insufficient documentation

## 2020-07-27 DIAGNOSIS — Z79899 Other long term (current) drug therapy: Secondary | ICD-10-CM | POA: Diagnosis not present

## 2020-07-27 DIAGNOSIS — Z85828 Personal history of other malignant neoplasm of skin: Secondary | ICD-10-CM | POA: Insufficient documentation

## 2020-07-27 DIAGNOSIS — Z96651 Presence of right artificial knee joint: Secondary | ICD-10-CM | POA: Diagnosis not present

## 2020-07-27 DIAGNOSIS — I129 Hypertensive chronic kidney disease with stage 1 through stage 4 chronic kidney disease, or unspecified chronic kidney disease: Secondary | ICD-10-CM | POA: Insufficient documentation

## 2020-07-27 DIAGNOSIS — Z96643 Presence of artificial hip joint, bilateral: Secondary | ICD-10-CM | POA: Insufficient documentation

## 2020-07-27 DIAGNOSIS — M436 Torticollis: Secondary | ICD-10-CM | POA: Diagnosis not present

## 2020-07-27 DIAGNOSIS — Z87891 Personal history of nicotine dependence: Secondary | ICD-10-CM | POA: Insufficient documentation

## 2020-07-27 DIAGNOSIS — N1832 Chronic kidney disease, stage 3b: Secondary | ICD-10-CM | POA: Insufficient documentation

## 2020-07-27 DIAGNOSIS — E1140 Type 2 diabetes mellitus with diabetic neuropathy, unspecified: Secondary | ICD-10-CM | POA: Diagnosis not present

## 2020-07-27 MED ORDER — LIDOCAINE 5 % EX PTCH
1.0000 | MEDICATED_PATCH | CUTANEOUS | Status: DC
  Administered 2020-07-27: 1 via TRANSDERMAL
  Filled 2020-07-27: qty 1

## 2020-07-27 MED ORDER — DICLOFENAC SODIUM 1 % EX GEL: 2.0000 g | Freq: Four times a day (QID) | CUTANEOUS | 0 refills | Status: DC

## 2020-07-27 MED ORDER — ACETAMINOPHEN 325 MG PO TABS: 650.0000 mg | ORAL_TABLET | Freq: Once | ORAL | Status: DC

## 2020-07-27 MED ORDER — DIAZEPAM 2 MG PO TABS
2.0000 mg | ORAL_TABLET | Freq: Once | ORAL | Status: AC
  Administered 2020-07-27: 2 mg via ORAL
  Filled 2020-07-27: qty 1

## 2020-07-27 MED ORDER — METHOCARBAMOL 500 MG PO TABS: 500.0000 mg | ORAL_TABLET | Freq: Three times a day (TID) | ORAL | 0 refills | Status: DC

## 2020-07-27 MED ORDER — LIDOCAINE 5 % EX PTCH
1.0000 | MEDICATED_PATCH | Freq: Two times a day (BID) | CUTANEOUS | 0 refills | Status: DC
Start: 2020-07-27 — End: 2020-09-11

## 2020-07-27 MED ORDER — CELECOXIB 100 MG PO CAPS
100.0000 mg | ORAL_CAPSULE | Freq: Once | ORAL | Status: AC
  Administered 2020-07-27: 100 mg via ORAL
  Filled 2020-07-27: qty 1

## 2020-07-27 MED ORDER — METHOCARBAMOL 500 MG PO TABS
500.0000 mg | ORAL_TABLET | Freq: Once | ORAL | Status: AC
  Administered 2020-07-27: 500 mg via ORAL
  Filled 2020-07-27: qty 1

## 2020-07-27 NOTE — ED Triage Notes (Signed)
Pt states neck pain to the right side that started 3 days ago. Pt states the pain has gotten so bad "I cannot keep it up and it hurts to eat." Pt states it started just before working out, and then he tried to work out to help it, but it got worse. Pt states history of this and in the past he would take a fexeril and the pain would go away. Pt states the only thing he has taken is a hydrocodone, that would sooth the pain, and then it would come back.

## 2020-07-27 NOTE — ED Provider Notes (Signed)
[facility] Emergency Department Provider Note  ____________________________________________  Time seen: Approximately 8:42 PM  I have reviewed the triage vital signs and the nursing notes.   HISTORY  Chief Complaint Neck Pain    HPI Daniel Kline is a 74 y.o. male that presents to emergency department for evaluation of right posterior neck pain for 2 days after using a head harness to lift weights to strengthen his neck.  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  He decided to get back into it recently.  He was using this to lift weights on Wednesday.  His neck became sore on Thursday.  He thought he would "work it out "so he began lifting weights with this head harness again.  Pain is to the back on the right side.  Patient states that he feels like it is a "crick "in his neck.  The muscle feels tight.  He never felt a pop or a snap.  He takes prescribed hydrocodone and took 1 this morning.  He has had muscle spasms and cramps to his neck many years ago and used to take Flexeril.  No headache, dizziness, visual changes, anterior neck pain, difficulty swallowing, difficulty breathing, radiculopathy.   Past Medical History:  Diagnosis Date  . Anemia    H/O  . Anxiety   . Arthritis   . Chronic kidney disease   . Complication of anesthesia   . 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 Diabetes mellitus without complication ([place])   . Dysplastic nevus 12/19/2017   Right distal lat. forearm near wrist. Severe atypia, close to peripheral margin.  Marland Kitchen Dysplastic nevus 06/21/2018   Upper back right paraspinal. Severe atypia, peripheral margin involved. Excised 07/11/2018, margins free.  Marland Kitchen Dysrhythmia    A FLUTTER  . Family history of adverse reaction to anesthesia    PT WAS ADOPTED  . HLD (hyperlipidemia)   . HTN (hypertension)   . HTN (hypertension)   . Hx of dysplastic nevus 2019   multiple sites  . Hx  of squamous cell carcinoma 01/18/2018   R mid lateral forearm  . MRSA (methicillin resistant Staphylococcus aureus)    after back surgery  . OSA (obstructive sleep apnea)    USES BIPAP  . Polio   . 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 PONV (postoperative nausea and vomiting)   . 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 forearm. SCCis, hypertrophic.    Patient Active Problem List   Diagnosis Date Noted  . Chronic pain 06/13/2020  . Preventative health care 03/10/2020  . Total knee replacement status 01/28/2020  . Encounter for long-term (current) use of high-risk medication 06/14/2019  . Seronegative rheumatoid arthritis ([place]) 06/14/2019  . Chronic narcotic dependence ([place]) 05/18/2019  . Anti-cyclic citrullinated peptide antibody positive 05/03/2019  . Synovitis of hand 05/03/2019  . Radial neuropathy, left 09/06/2018  . Type 2 diabetes mellitus with diabetic nephropathy ([place]) 06/22/2018  . Chronic bilateral low back pain without sciatica 06/09/2018  . Knee pain, chronic 04/12/2018  . OSA (obstructive sleep apnea) 09/07/2017  . History of poliomyelitis 08/27/2017  . Localized edema 08/12/2017  . Actinic keratosis 07/26/2017  . History of total hip arthroplasty, bilateral 06/09/2016  . GERD (gastroesophageal reflux disease) 04/21/2016  . PONV (postoperative nausea and vomiting) 04/21/2016  . Skin cancer 04/21/2016  . Asymptomatic gallstones 01/06/2016  . Atrial flutter ([place]) 10/29/2015  .  Venous stasis dermatitis of both lower extremities 10/29/2015  . Stage 3b chronic kidney disease ([place]) 09/27/2015  . Morbid obesity ([place]) 09/27/2015  . Primary osteoarthritis of right knee 08/26/2015  . Cutaneous T-cell lymphoma ([place]) 12/20/2011  . Hypercholesterolemia 12/20/2011  . Infection and inflammatory reaction due to internal prosthetic device, implant, and graft 12/20/2011  . Essential hypertension 01/12/2010    Past Surgical History:  Procedure  Laterality Date  . BACK SURGERY     LUMBAR  . CARDIAC ELECTROPHYSIOLOGY STUDY AND ABLATION  2019  . COLONOSCOPY WITH PROPOFOL N/A 04/04/2019   Procedure: COLONOSCOPY WITH PROPOFOL;  Surgeon: Toledo, Benay Pike, MD;  Location: [facility] ENDOSCOPY;  Service: Gastroenterology;  Laterality: N/A;  . I & D EXTREMITY Right 02/01/2020   Procedure: IRRIGATION AND DEBRIDEMENT EXTREMITY with poly exchange;  Surgeon: Dereck Leep, MD;  Location: [facility] ORS;  Service: Orthopedics;  Laterality: Right;  . INCISION AND DRAINAGE     BACK-MRSA INFECTION AFTER BACK SURGERY  . KNEE ARTHROPLASTY Right 01/28/2020   Procedure: COMPUTER ASSISTED TOTAL KNEE ARTHROPLASTY;  Surgeon: Dereck Leep, MD;  Location: [facility] ORS;  Service: Orthopedics;  Laterality: Right;  . MOUTH SURGERY    . right elbow surgery    . right knee surgery    . right shoulder surgery     from polio damage  . TONSILLECTOMY    . TOTAL HIP ARTHROPLASTY Bilateral 04/2016    Prior to Admission medications   Medication Sig Start Date End Date Taking? Authorizing Provider  amLODipine (NORVASC) 10 MG tablet TAKE 1 TABLET BY MOUTH  DAILY 07/24/20   Viviana Simpler I, MD  carvedilol (COREG) 12.5 MG tablet Take 1 tablet (12.5 mg total) by mouth 2 (two) times daily. 01/23/20 04/22/20  Verta Ellen., NP  celecoxib (CELEBREX) 200 MG capsule TAKE 1 CAPSULE BY MOUTH EVERY DAY 06/13/20   [name], MD  Cholecalciferol (VITAMIN D-3 PO) Take 10,000 Units by mouth daily.     [provider]  clobetasol cream (TEMOVATE) 0.63 % Apply 1 application topically 2 (two) times daily as needed (irritation).  06/27/15   [provider]  clorazepate (TRANXENE) 7.5 MG tablet TAKE 1 TABLET (7.5 MG TOTAL) BY MOUTH 2 (TWO) TIMES DAILY AS NEEDED FOR ANXIETY. 07/11/20   [name], MD  diclofenac Sodium (VOLTAREN) 1 % GEL Apply 2 g topically 4 (four) times daily. 07/27/20   Laban Emperor, PA-C  Elastic Bandages & Supports (MEDICAL COMPRESSION  STOCKINGS) MISC 2 Units by Does not apply route daily. 08/19/17   Merlyn Lot, MD  EPINEPHrine 0.3 mg/0.3 mL IJ SOAJ injection Inject 0.3 mLs (0.3 mg total) into the muscle as needed for anaphylaxis. 11/22/19   [name], MD  Ferrous Sulfate (IRON PO) Take 1 tablet by mouth at bedtime.    [provider]  fluconazole (DIFLUCAN) 150 MG tablet Take 1 tablet (150 mg total) by mouth every 3 (three) days. Repeat in 3 days if rash not dramatically improved 07/08/20   Moye, [place], MD  folic acid (FOLVITE) 1 MG tablet Take 1 mg by mouth daily. 06/11/20   [provider]  furosemide (LASIX) 40 MG tablet Take 40 mg by mouth 2 (two) times daily.    [provider]  gabapentin (NEURONTIN) 300 MG capsule Take 1 capsule (300 mg total) by mouth 3 (three) times daily. 06/30/20   [name], MD  glipiZIDE (GLUCOTROL) 5 MG tablet TAKE ONE-HALF TABLET BY  MOUTH 2 TIMES  DAILY BEFORE  A MEAL. Patient taking differently: Take 2.5 mg by mouth 2 (two) times daily before a meal.  11/26/19   Viviana Simpler I, MD  HYDROcodone-acetaminophen (NORCO/VICODIN) 5-325 MG tablet TAKE 1 TABLET BY MOUTH EVERY 6 HOURS AS NEEDED FOR PAIN. 07/22/20   [name], MD  hydrOXYzine (ATARAX/VISTARIL) 25 MG tablet TAKE ONE TABLET EVERY EIGHT HOURS AS NEEDED 04/10/20   [name], MD  isosorbide mononitrate (IMDUR) 60 MG 24 hr tablet Take 60 mg by mouth daily before lunch.  08/22/18   [provider]  ketoconazole (NIZORAL) 2 % cream Apply 1 application topically in the morning and at bedtime. 07/24/20 09/04/20  Moye, [place], MD  lidocaine (LIDODERM) 5 % Place 1 patch onto the skin every 12 (twelve) hours. Remove & Discard patch within 12 hours or as directed by MD 07/27/20 07/27/21  Laban Emperor, PA-C  liraglutide (VICTOZA) 18 MG/3ML SOPN Inject 0.2 mLs (1.2 mg total) into the skin daily. 08/23/19   [name], MD  losartan (COZAAR) 100 MG tablet TAKE ONE TABLET EVERY  DAY 05/26/20   Minna Merritts, MD  metFORMIN (GLUCOPHAGE) 500 MG tablet TAKE 1 TABLET BY MOUTH TWO  TIMES DAILY Patient taking differently: Take 500 mg by mouth 2 (two) times daily with a meal.  09/11/19   [name], MD  methocarbamol (ROBAXIN) 500 MG tablet Take 1 tablet (500 mg total) by mouth 3 (three) times daily. 07/27/20   Laban Emperor, PA-C  methotrexate 2.5 MG tablet Take 6 tablets by mouth once a week. 06/11/20   [provider]  Multiple Vitamins-Minerals (MULTIVITAL) tablet Take 1 tablet by mouth daily.      [provider]  ONETOUCH ULTRA test strip CHECK BLOOD SUGAR 3 TIMES DAILY AS DIRECTED 01/04/20   Crecencio Mc, MD  rosuvastatin (CRESTOR) 20 MG tablet TAKE ONE TABLET EVERY DAY Patient taking differently: Take 20 mg by mouth daily.  09/06/19   [name], MD  timolol (TIMOPTIC) 0.5 % ophthalmic solution Apply 1 drop to eye 2 (two) times daily. 01/11/20   [provider]    Allergies Bee venom, Oxycodone, Hydromorphone, and Zolpidem  Family History  Adopted: Yes    Social History Social History   Tobacco Use  . Smoking status: Former Smoker    Types: Cigars    Quit date: 09/23/1990    Years since quitting: 29.8  . Smokeless tobacco: Former Systems developer    Quit date: 02/25/2006  Vaping Use  . Vaping Use: Never used  Substance Use Topics  . Alcohol use: No    Alcohol/week: 0.0 standard drinks  . Drug use: No     Review of Systems  Constitutional: No fever/chills ENT: No upper respiratory complaints. Cardiovascular: No chest pain. Respiratory: No cough. No SOB. Gastrointestinal: No abdominal pain.  No nausea, no vomiting.  Musculoskeletal: Positive for neck pain. Skin: Negative for rash, abrasions, lacerations, ecchymosis. Neurological: Negative for headaches   ____________________________________________   PHYSICAL EXAM:  VITAL SIGNS: ED Triage Vitals  Enc Vitals Group     BP 07/27/20 1922 (!) 191/87     Pulse  Rate 07/27/20 1922 80     Resp 07/27/20 1922 18     Temp 07/27/20 1922 98.5 F (36.9 C)     Temp src --      SpO2 07/27/20 1922 97 %     Weight 07/27/20 1923 240 lb (108.9 kg)     Height 07/27/20 1923 5'  8" (1.727 m)     Head Circumference --      Peak Flow --      Pain Score 07/27/20 1922 9     Pain Loc --      Pain Edu? --      Excl. in [place]? --      Constitutional: Alert and oriented. Well appearing and in no acute distress. Eyes: Conjunctivae are normal. PERRL. EOMI. Head: Atraumatic. ENT:      Ears:      Nose: No congestion/rhinnorhea.      Mouth/Throat: Mucous membranes are moist.  Neck: No stridor.  No cervical spine tenderness to palpation.  Tenderness to palpation to right trapezius.  Limited range of motion secondary to pain. Cardiovascular: Normal rate, regular rhythm.  Good peripheral circulation. Respiratory: Normal respiratory effort without tachypnea or retractions. Lungs CTAB. Good air entry to the bases with no decreased or absent breath sounds. Gastrointestinal: Bowel sounds 4 quadrants. Soft and nontender to palpation. No guarding or rigidity. No palpable masses. No distention. Musculoskeletal: Full range of motion to all extremities. No gross deformities appreciated.  Strength equal in upper extremities bilaterally. Neurologic:  Normal speech and language. No gross focal neurologic deficits are appreciated.  Skin:  Skin is warm, dry and intact. No rash noted. Psychiatric: Mood and affect are normal. Speech and behavior are normal. Patient exhibits appropriate insight and judgement.   ____________________________________________   LABS (all labs ordered are listed, but only abnormal results are displayed)  Labs Reviewed - No data to display ____________________________________________  EKG   ____________________________________________  RADIOLOGY Robinette Haines, personally viewed and evaluated these images (plain radiographs) as part of my medical  decision making, as well as reviewing the written report by the radiologist.  CT Cervical Spine Wo Contrast  Result Date: 07/27/2020 CLINICAL DATA:  Right-sided neck pain for 3 days EXAM: CT CERVICAL SPINE WITHOUT CONTRAST TECHNIQUE: Multidetector CT imaging of the cervical spine was performed without intravenous contrast. Multiplanar CT image reconstructions were also generated. COMPARISON:  06/10/2006 FINDINGS: Alignment: Straightening of the cervical spine may be positional. Otherwise alignment is anatomic. Skull base and vertebrae: No acute fracture. No primary bone lesion or focal pathologic process. Soft tissues and spinal canal: No prevertebral fluid or swelling. No visible canal hematoma. Disc levels: There is diffuse multilevel cervical spondylosis greatest from C4-5 through C6-7. Right predominant facet hypertrophic changes from C2 through C6. Right predominant neural foraminal narrowing at C3-4, C4-5, and C5-6. Upper chest: Airway is patent.  Lung apices are clear. Other: Reconstructed images demonstrate no additional findings. IMPRESSION: 1. Diffuse cervical spondylosis and facet hypertrophy. No acute cervical spine fracture. Electronically Signed   By: Randa Ngo M.D.   On: 07/27/2020 20:48    ____________________________________________    PROCEDURES  Procedure(s) performed:    Procedures    Medications  lidocaine (LIDODERM) 5 % 1 patch (1 patch Transdermal Patch Applied 07/27/20 2132)  diazepam (VALIUM) tablet 2 mg (2 mg Oral Given 07/27/20 2001)  methocarbamol (ROBAXIN) tablet 500 mg (500 mg Oral Given 07/27/20 2133)  celecoxib (CELEBREX) capsule 100 mg (100 mg Oral Given 07/27/20 2133)     ____________________________________________   INITIAL IMPRESSION / ASSESSMENT AND PLAN / ED COURSE  Pertinent labs & imaging results that were available during my care of the patient were reviewed by me and considered in my medical decision making (see chart for  details).  Review of the [place] CSRS was performed in accordance of the [place] prior to dispensing  any controlled drugs.   Patient presents emergency department for evaluation of neck pain following weightlifting 2 days ago.  Vital signs and exam are reassuring.  CT scan is negative for fracture or acute trauma.  Symptoms improved following Valium, Robaxin, Celebrex, Lidoderm.  Patient has better range of motion currently.  Patient will be discharged home with prescriptions for Robaxin, Lidoderm, Voltaren gel. Patient is to follow up with primary care as directed. Patient is given ED precautions to return to the ED for any worsening or new symptoms.   ARIS EVEN was evaluated in Emergency Department on 07/27/2020 for the symptoms described in the history of present illness. He was evaluated in the context of the global COVID-19 pandemic, which necessitated consideration that the patient might be at risk for infection with the SARS-CoV-2 virus that causes COVID-19. Institutional protocols and algorithms that pertain to the evaluation of patients at risk for COVID-19 are in a state of rapid change based on information released by regulatory bodies including the CDC and federal and state organizations. These policies and algorithms were followed during the patient's care in the ED.  ____________________________________________  FINAL CLINICAL IMPRESSION(S) / ED DIAGNOSES  Final diagnoses:  Neck pain  Torticollis, acute      NEW MEDICATIONS STARTED DURING THIS VISIT:  ED Discharge Orders         Ordered    methocarbamol (ROBAXIN) 500 MG tablet  3 times daily        07/27/20 2236    lidocaine (LIDODERM) 5 %  Every 12 hours        07/27/20 2236    diclofenac Sodium (VOLTAREN) 1 % GEL  4 times daily        07/27/20 2236              This chart was dictated using voice recognition software/Dragon. Despite best efforts to proofread, errors can occur which can change the meaning. Any  change was purely unintentional.    Laban Emperor, PA-C 07/27/20 2259    Nance Pear, MD 07/30/20 [phone]

## 2020-07-28 ENCOUNTER — Telehealth: Payer: Self-pay

## 2020-07-28 NOTE — Telephone Encounter (Signed)
Left message to see how pt was doing after ER visit yesterday for neck pain.

## 2020-07-28 NOTE — Telephone Encounter (Signed)
Please check on him  Neck pain after weight lifting  CT negative

## 2020-07-28 NOTE — Telephone Encounter (Signed)
Spoke to wife. She said he is still in pain. She will have him call us Wednesday if there is no improvement.

## 2020-07-30 ENCOUNTER — Encounter: Payer: Self-pay | Admitting: Dermatology

## 2020-08-04 DIAGNOSIS — C84A0 Cutaneous T-cell lymphoma, unspecified, unspecified site: Secondary | ICD-10-CM | POA: Diagnosis not present

## 2020-08-07 DIAGNOSIS — Z96651 Presence of right artificial knee joint: Secondary | ICD-10-CM | POA: Diagnosis not present

## 2020-08-07 DIAGNOSIS — E1142 Type 2 diabetes mellitus with diabetic polyneuropathy: Secondary | ICD-10-CM | POA: Diagnosis not present

## 2020-08-25 ENCOUNTER — Other Ambulatory Visit: Payer: Self-pay | Admitting: Internal Medicine

## 2020-08-25 NOTE — Telephone Encounter (Signed)
Name of [place] Name of [place] or Written Date and Quantity:07-22-20 #60 Last Office Visit and Type:06-13-20 Next Office Visit and Type:09-12-19 Last Controlled Substance Agreement Date:06-13-20 Last UDS:06-13-20

## 2020-08-26 DIAGNOSIS — G4733 Obstructive sleep apnea (adult) (pediatric): Secondary | ICD-10-CM | POA: Diagnosis not present

## 2020-09-01 DIAGNOSIS — B351 Tinea unguium: Secondary | ICD-10-CM | POA: Diagnosis not present

## 2020-09-01 DIAGNOSIS — E1142 Type 2 diabetes mellitus with diabetic polyneuropathy: Secondary | ICD-10-CM | POA: Diagnosis not present

## 2020-09-05 ENCOUNTER — Other Ambulatory Visit: Payer: Self-pay | Admitting: Internal Medicine

## 2020-09-06 ENCOUNTER — Other Ambulatory Visit: Payer: Self-pay | Admitting: Internal Medicine

## 2020-09-08 NOTE — Telephone Encounter (Signed)
Last filled 07-11-20 #60 Last OV 06-13-20 Next OV 09-11-20 [facility]

## 2020-09-10 ENCOUNTER — Other Ambulatory Visit: Payer: Self-pay

## 2020-09-10 ENCOUNTER — Ambulatory Visit: Payer: Medicare Other | Admitting: Dermatology

## 2020-09-10 ENCOUNTER — Encounter: Payer: Self-pay | Admitting: Dermatology

## 2020-09-10 DIAGNOSIS — C84A0 Cutaneous T-cell lymphoma, unspecified, unspecified site: Secondary | ICD-10-CM

## 2020-09-10 DIAGNOSIS — B356 Tinea cruris: Secondary | ICD-10-CM | POA: Diagnosis not present

## 2020-09-10 NOTE — Progress Notes (Signed)
   Follow-Up Visit   Subjective  Daniel Kline is a 75 y.o. male who presents for the following: CTCL  (Patient restarted MTX 2.5mg  6 tabs po QD on Thursday's, and folic acid on the off days. He also uses an at home light box once per week). Per patient tinea cruris resolved with Ketoconazole 2% cream BID and he would like to know if he should continue using the cream.    The following portions of the chart were reviewed this encounter and updated as appropriate:   Tobacco  Allergies  Meds  Problems  Med Hx  Surg Hx  Fam Hx     Review of Systems:  No other skin or systemic complaints except as noted in HPI or Assessment and Plan.  Objective  Well appearing patient in no apparent distress; mood and affect are within normal limits.  A focused examination was performed including the trunk and extremities. Relevant physical exam findings are noted in the Assessment and Plan.  Objective  Groin, back, post legs, buttock: Erythematous patches, few thin plaques and thin papules           Objective  Inguinal folds: Clear today    Assessment & Plan  Cutaneous T-cell lymphoma, unspecified body region (HCC) Groin, back, post legs, buttock  Chronic. Well controlled today -   Continue MTX 2.5mg  6po QD on Thursday's as prescribed by Rheumatologist. Continue following up with [facility]. Continue at home light box once weekly.  Recommend Covid vaccine booster for immunocompromised patient.  Tinea cruris Inguinal folds  D/C Ketoconazole 2% cream, but may restart BID for recurrence.   Call for any rash that does not respond to ketoconazole cream  Return in about 1 year (around 09/10/2021) for TBSE and CTCL follow up .  Luther Redo, CMA, am acting as scribe for Forest Gleason, MD .  Documentation: I have reviewed the above documentation for accuracy and completeness, and I agree with the above.  Forest Gleason, MD

## 2020-09-11 ENCOUNTER — Encounter: Payer: Self-pay | Admitting: Internal Medicine

## 2020-09-11 ENCOUNTER — Ambulatory Visit (INDEPENDENT_AMBULATORY_CARE_PROVIDER_SITE_OTHER): Payer: Medicare Other | Admitting: Internal Medicine

## 2020-09-11 DIAGNOSIS — E1121 Type 2 diabetes mellitus with diabetic nephropathy: Secondary | ICD-10-CM | POA: Diagnosis not present

## 2020-09-11 DIAGNOSIS — F1120 Opioid dependence, uncomplicated: Secondary | ICD-10-CM | POA: Diagnosis not present

## 2020-09-11 DIAGNOSIS — G8929 Other chronic pain: Secondary | ICD-10-CM | POA: Diagnosis not present

## 2020-09-11 DIAGNOSIS — M545 Low back pain, unspecified: Secondary | ICD-10-CM | POA: Diagnosis not present

## 2020-09-11 MED ORDER — LIRAGLUTIDE 18 MG/3ML ~~LOC~~ SOPN: 1.8000 mg | PEN_INJECTOR | Freq: Every day | SUBCUTANEOUS | 11 refills | Status: DC

## 2020-09-11 NOTE — Assessment & Plan Note (Signed)
Lab Results  Component Value Date   HGBA1C 7.7 (A) 06/13/2020   Sugars still up some Will increase the liraglutide dose

## 2020-09-11 NOTE — Progress Notes (Signed)
Subjective:    Patient ID: Daniel Kline, male    DOB: 10/04/1945, 75 y.o.   MRN: [id number]  HPI Here for follow up of chronic pain and narcotic dependence This visit occurred during the SARS-CoV-2 public health emergency.  Safety protocols were in place, including screening questions prior to the visit, additional usage of staff PPE, and extensive cleaning of exam room while observing appropriate contact time as indicated for disinfecting solutions.   He feels the pain control has been better Has been doing some weight training--thinks his shoulders Pain is largely in upper extremity Legs are pretty good---back is better Using the hydrocodone regularly still  Using OTC "Balance of Petra K[place]" pills He feels it gives him fruit/vegetable balance  Checks sugars sporadically 99-207 Average ~150  Current Outpatient Medications on File Prior to Visit  Medication Sig Dispense Refill  . amLODipine (NORVASC) 10 MG tablet TAKE 1 TABLET BY MOUTH  DAILY 90 tablet 3  . celecoxib (CELEBREX) 200 MG capsule TAKE 1 CAPSULE BY MOUTH EVERY DAY 30 capsule 11  . Cholecalciferol (VITAMIN D-3 PO) Take 10,000 Units by mouth daily.     . clobetasol cream (TEMOVATE) 3.55 % Apply 1 application topically 2 (two) times daily as needed (irritation).     . clorazepate (TRANXENE) 7.5 MG tablet TAKE 1 TABLET (7.5 MG TOTAL) BY MOUTH 2 (TWO) TIMES DAILY AS NEEDED FOR ANXIETY. 60 tablet 0  . diclofenac Sodium (VOLTAREN) 1 % GEL Apply 2 g topically 4 (four) times daily. 50 g 0  . Elastic Bandages & Supports (MEDICAL COMPRESSION STOCKINGS) MISC 2 Units by Does not apply route daily. 2 each 0  . EPINEPHrine 0.3 mg/0.3 mL IJ SOAJ injection Inject 0.3 mLs (0.3 mg total) into the muscle as needed for anaphylaxis. 1 each 5  . Ferrous Sulfate (IRON PO) Take 1 tablet by mouth at bedtime.    . fluconazole (DIFLUCAN) 150 MG tablet Take 1 tablet (150 mg total) by mouth every 3 (three) days. Repeat in 3 days if rash not  dramatically improved 2 tablet 0  . folic acid (FOLVITE) 1 MG tablet Take 1 mg by mouth daily.    . furosemide (LASIX) 40 MG tablet Take 40 mg by mouth 2 (two) times daily.    Marland Kitchen gabapentin (NEURONTIN) 300 MG capsule Take 1 capsule (300 mg total) by mouth 3 (three) times daily. 21 capsule 0  . glipiZIDE (GLUCOTROL) 5 MG tablet TAKE ONE-HALF TABLET BY  MOUTH 2 TIMES DAILY BEFORE  A MEAL. (Patient taking differently: Take 2.5 mg by mouth 2 (two) times daily before a meal.) 90 tablet 3  . HYDROcodone-acetaminophen (NORCO/VICODIN) 5-325 MG tablet TAKE 1 TABLET BY MOUTH EVERY 6 HOURS AS NEEDED FOR PAIN. 60 tablet 0  . hydrOXYzine (ATARAX/VISTARIL) 25 MG tablet TAKE ONE TABLET EVERY EIGHT HOURS AS NEEDED 90 tablet 3  . isosorbide mononitrate (IMDUR) 60 MG 24 hr tablet Take 60 mg by mouth daily before lunch.     . liraglutide (VICTOZA) 18 MG/3ML SOPN Inject 0.2 mLs (1.2 mg total) into the skin daily. 2 pen 11  . losartan (COZAAR) 100 MG tablet TAKE ONE TABLET EVERY DAY 90 tablet 1  . metFORMIN (GLUCOPHAGE) 500 MG tablet TAKE 1 TABLET BY MOUTH TWO  TIMES DAILY (Patient taking differently: Take 500 mg by mouth 2 (two) times daily with a meal.) 180 tablet 3  . methotrexate 2.5 MG tablet Take 6 tablets by mouth once a week.    . Multiple Vitamins-Minerals (MULTIVITAL)  tablet Take 1 tablet by mouth daily.    Glory Rosebush ULTRA test strip CHECK BLOOD SUGAR 3 TIMES DAILY AS DIRECTED 100 each 1  . rosuvastatin (CRESTOR) 20 MG tablet TAKE ONE TABLET EVERY DAY 90 tablet 3  . timolol (TIMOPTIC) 0.5 % ophthalmic solution Apply 1 drop to eye 2 (two) times daily.    . carvedilol (COREG) 12.5 MG tablet Take 1 tablet (12.5 mg total) by mouth 2 (two) times daily. 180 tablet 3   No current facility-administered medications on file prior to visit.    Allergies  Allergen Reactions  . Bee Venom Anaphylaxis  . Oxycodone Other (See Comments)    Delusions Delusions   Delusions  . Hydromorphone Other (See Comments)     hallucinating  . Zolpidem Other (See Comments)    Past Medical History:  Diagnosis Date  . Anemia    H/O  . Anxiety   . Arthritis   . Chronic kidney disease   . Complication of anesthesia   . 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 Diabetes mellitus without complication ([place])   . Dysplastic nevus 12/19/2017   Right distal lat. forearm near wrist. Severe atypia, close to peripheral margin.  Marland Kitchen Dysplastic nevus 06/21/2018   Upper back right paraspinal. Severe atypia, peripheral margin involved. Excised 07/11/2018, margins free.  Marland Kitchen Dysrhythmia    A FLUTTER  . Family history of adverse reaction to anesthesia    PT WAS ADOPTED  . HLD (hyperlipidemia)   . HTN (hypertension)   . HTN (hypertension)   . Hx of dysplastic nevus 2019   multiple sites  . 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 MRSA (methicillin resistant Staphylococcus aureus)    after back surgery  . OSA (obstructive sleep apnea)    USES BIPAP  . Polio   . 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 PONV (postoperative nausea and vomiting)   . 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 forearm. SCCis, hypertrophic.    Past Surgical History:  Procedure Laterality Date  . BACK SURGERY     LUMBAR  . CARDIAC ELECTROPHYSIOLOGY STUDY AND ABLATION  2019  . COLONOSCOPY WITH PROPOFOL N/A 04/04/2019   Procedure: COLONOSCOPY WITH PROPOFOL;  Surgeon: Toledo, Benay Pike, MD;  Location: [facility];  Service: Gastroenterology;  Laterality: N/A;  . I & D EXTREMITY Right 02/01/2020   Procedure: IRRIGATION AND DEBRIDEMENT EXTREMITY with poly exchange;  Surgeon: Dereck Leep, MD;  Location: [facility];  Service: Orthopedics;  Laterality: Right;  . INCISION AND DRAINAGE     BACK-MRSA INFECTION AFTER BACK SURGERY  . KNEE ARTHROPLASTY Right 01/28/2020   Procedure: COMPUTER ASSISTED TOTAL KNEE ARTHROPLASTY;  Surgeon: Dereck Leep, MD;  Location: [facility]S;  Service: Orthopedics;   Laterality: Right;  . MOUTH SURGERY    . right elbow surgery    . right knee surgery    . right shoulder surgery     from polio damage  . TONSILLECTOMY    . TOTAL HIP ARTHROPLASTY Bilateral 04/2016    Family History  Adopted: Yes    Social History   Socioeconomic History  . Marital status: Married    Spouse name: Not on file  . Number of children: 0  . Years of education: Not on file  . Highest education level: Not on file  Occupational History  . 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 Occupation:  Law Lawyer    Comment: Retired  . Occupation: Warden/ranger    Comment: Retired  Tobacco Use  . Smoking status: Former Smoker    Types: Cigars    Quit date: 09/23/1990    Years since quitting: 29.9  . Smokeless tobacco: Former Systems developer    Quit date: 02/25/2006  Vaping Use  . Vaping Use: Never used  Substance and Sexual Activity  . Alcohol use: No    Alcohol/week: 0.0 standard drinks  . Drug use: No  . Sexual activity: Yes    Partners: Female  Other Topics Concern  . Not on file  Social History Narrative   Has living will   Wife is health care POA---then brother or sister   Would accept resuscitation attempts but no prolonged ventilation or tube feeds   Social Determinants of Health   Financial Resource Strain: Not on file  Food Insecurity: Not on file  Transportation Needs: Not on file  Physical Activity: Not on file  Stress: Not on file  Social Connections: Not on file  Intimate Partner Violence: Not on file   Review of Systems Uses tranxene to sleep and occasionally in the day---lots of anxiety at times Not feeling depressed Appetite is fair---appetite off some times though (often only eats 2 meals a day)     Objective:   Physical Exam Constitutional:      Appearance: Normal appearance.  Neurological:     Mental Status: He is alert.  Psychiatric:        Mood and Affect: Mood normal.         Behavior: Behavior normal.            Assessment & Plan:

## 2020-09-11 NOTE — Assessment & Plan Note (Signed)
PDMP reviewed No concerns 

## 2020-09-11 NOTE — Assessment & Plan Note (Signed)
Ongoing pain is controlled  Uses the hydrocodone reguarly

## 2020-09-25 DIAGNOSIS — M25512 Pain in left shoulder: Secondary | ICD-10-CM | POA: Diagnosis not present

## 2020-09-25 DIAGNOSIS — Z79899 Other long term (current) drug therapy: Secondary | ICD-10-CM | POA: Diagnosis not present

## 2020-09-25 DIAGNOSIS — N1832 Chronic kidney disease, stage 3b: Secondary | ICD-10-CM | POA: Diagnosis not present

## 2020-09-25 DIAGNOSIS — M0609 Rheumatoid arthritis without rheumatoid factor, multiple sites: Secondary | ICD-10-CM | POA: Diagnosis not present

## 2020-09-29 ENCOUNTER — Other Ambulatory Visit: Payer: Self-pay | Admitting: Internal Medicine

## 2020-09-29 NOTE — Telephone Encounter (Signed)
Name of [place] Name of [place] or Written Date and Quantity:08-25-20 #60 Last Office Visit and Type:09-11-20 Next Office Visit and Type:12-11-20 Last Controlled Substance Agreement Date:06-13-20 Last UDS:06-13-20

## 2020-09-30 ENCOUNTER — Other Ambulatory Visit: Payer: Self-pay | Admitting: Internal Medicine

## 2020-10-01 DIAGNOSIS — R809 Proteinuria, unspecified: Secondary | ICD-10-CM | POA: Diagnosis not present

## 2020-10-01 DIAGNOSIS — D631 Anemia in chronic kidney disease: Secondary | ICD-10-CM | POA: Diagnosis not present

## 2020-10-01 DIAGNOSIS — N2889 Other specified disorders of kidney and ureter: Secondary | ICD-10-CM | POA: Diagnosis not present

## 2020-10-01 DIAGNOSIS — E1122 Type 2 diabetes mellitus with diabetic chronic kidney disease: Secondary | ICD-10-CM | POA: Diagnosis not present

## 2020-10-01 DIAGNOSIS — N2581 Secondary hyperparathyroidism of renal origin: Secondary | ICD-10-CM | POA: Diagnosis not present

## 2020-10-01 DIAGNOSIS — N1832 Chronic kidney disease, stage 3b: Secondary | ICD-10-CM | POA: Diagnosis not present

## 2020-10-01 DIAGNOSIS — I129 Hypertensive chronic kidney disease with stage 1 through stage 4 chronic kidney disease, or unspecified chronic kidney disease: Secondary | ICD-10-CM | POA: Diagnosis not present

## 2020-10-03 ENCOUNTER — Other Ambulatory Visit: Payer: Self-pay | Admitting: Internal Medicine

## 2020-10-06 ENCOUNTER — Encounter: Payer: Self-pay | Admitting: Dermatology

## 2020-10-16 DIAGNOSIS — R7989 Other specified abnormal findings of blood chemistry: Secondary | ICD-10-CM | POA: Diagnosis not present

## 2020-10-19 ENCOUNTER — Other Ambulatory Visit: Payer: Self-pay | Admitting: Internal Medicine

## 2020-10-31 ENCOUNTER — Other Ambulatory Visit: Payer: Self-pay | Admitting: Internal Medicine

## 2020-10-31 NOTE — Telephone Encounter (Signed)
Name of [place] Name of [place] or Written Date and Quantity:09-29-20 #60 Last Office Visit and Type:09-11-20 Next Office Visit and Type:12-11-20 Last Controlled Substance Agreement Date:06-13-20 Last UDS:06-13-20

## 2020-11-25 ENCOUNTER — Other Ambulatory Visit: Payer: Self-pay | Admitting: Cardiovascular Disease

## 2020-11-25 DIAGNOSIS — I10 Essential (primary) hypertension: Secondary | ICD-10-CM

## 2020-11-28 ENCOUNTER — Other Ambulatory Visit: Payer: Self-pay | Admitting: Internal Medicine

## 2020-12-01 NOTE — Telephone Encounter (Signed)
Last filled 09/10/20 #60  Last OV 2/3/022 Next OV 12/11/2020 [facility] [address] [facility] Rabon

## 2020-12-02 ENCOUNTER — Other Ambulatory Visit: Payer: Self-pay | Admitting: Internal Medicine

## 2020-12-09 ENCOUNTER — Other Ambulatory Visit: Payer: Self-pay | Admitting: Internal Medicine

## 2020-12-10 NOTE — Telephone Encounter (Signed)
Name of [place] Name of [place] or Written Date and Quantity:10-31-20#60 Last Office Visit and Type:09-11-20 Next Office Visit and Type:12-11-20 Last Controlled Substance Agreement Date:06-13-20 Last UDS:06-13-20

## 2020-12-11 ENCOUNTER — Encounter: Payer: Self-pay | Admitting: Internal Medicine

## 2020-12-11 ENCOUNTER — Ambulatory Visit (INDEPENDENT_AMBULATORY_CARE_PROVIDER_SITE_OTHER): Payer: Medicare Other | Admitting: Internal Medicine

## 2020-12-11 ENCOUNTER — Other Ambulatory Visit: Payer: Self-pay

## 2020-12-11 VITALS — BP 138/82 | HR 68 | Temp 98.3°F | Ht 67.0 in | Wt 243.0 lb

## 2020-12-11 DIAGNOSIS — E1121 Type 2 diabetes mellitus with diabetic nephropathy: Secondary | ICD-10-CM | POA: Diagnosis not present

## 2020-12-11 DIAGNOSIS — G8929 Other chronic pain: Secondary | ICD-10-CM | POA: Diagnosis not present

## 2020-12-11 DIAGNOSIS — F1120 Opioid dependence, uncomplicated: Secondary | ICD-10-CM

## 2020-12-11 DIAGNOSIS — M545 Low back pain, unspecified: Secondary | ICD-10-CM | POA: Diagnosis not present

## 2020-12-11 DIAGNOSIS — N1832 Chronic kidney disease, stage 3b: Secondary | ICD-10-CM

## 2020-12-11 LAB — POCT GLYCOSYLATED HEMOGLOBIN (HGB A1C): Hemoglobin A1C: 6.9 % — AB (ref 4.0–5.6)

## 2020-12-11 LAB — HM DIABETES FOOT EXAM

## 2020-12-11 NOTE — Progress Notes (Signed)
Subjective:    Patient ID: Daniel Kline, male    DOB: 06-10-1946, 75 y.o.   MRN: [id number]  HPI Here for follow up of chronic pain and diabetes This visit occurred during the SARS-CoV-2 public health emergency.  Safety protocols were in place, including screening questions prior to the visit, additional usage of staff PPE, and extensive cleaning of exam room while observing appropriate contact time as indicated for disinfecting solutions.   Still with chronic shoulder and back pain Takes the hydrocodone daily or twice a day May even hurt just trying to shave his head Helps with housework  Checks sugars at least twice a week 110-156 (rarely higher) liraglutide does help his appetite ---often doesn't need lunch Also glipizide and metformin No apparent hypoglycemia GFR holding in the 35-45 range No foot burning or numbness--on the gabapentin  Current Outpatient Medications on File Prior to Visit  Medication Sig Dispense Refill  . amLODipine (NORVASC) 10 MG tablet TAKE 1 TABLET BY MOUTH  DAILY 90 tablet 3  . celecoxib (CELEBREX) 200 MG capsule TAKE 1 CAPSULE BY MOUTH EVERY DAY 30 capsule 11  . Cholecalciferol (VITAMIN D-3 PO) Take 10,000 Units by mouth daily.     . clobetasol cream (TEMOVATE) 8.36 % Apply 1 application topically 2 (two) times daily as needed (irritation).     . clorazepate (TRANXENE) 7.5 MG tablet TAKE 1 TABLET (7.5 MG TOTAL) BY MOUTH 2 (TWO) TIMES DAILY AS NEEDED FOR ANXIETY. 60 tablet 0  . diclofenac Sodium (VOLTAREN) 1 % GEL APPLY 2 GRAMS TOPICALLY FOUR TIMES DAILY 100 g 11  . Elastic Bandages & Supports (MEDICAL COMPRESSION STOCKINGS) MISC 2 Units by Does not apply route daily. 2 each 0  . EPINEPHrine 0.3 mg/0.3 mL IJ SOAJ injection Inject 0.3 mLs (0.3 mg total) into the muscle as needed for anaphylaxis. 1 each 5  . Ferrous Sulfate (IRON PO) Take 1 tablet by mouth at bedtime.    . folic acid (FOLVITE) 1 MG tablet Take 1 mg by mouth daily.    . furosemide  (LASIX) 40 MG tablet Take 40 mg by mouth 2 (two) times daily.    Marland Kitchen gabapentin (NEURONTIN) 300 MG capsule TAKE 1 CAPSULE BY MOUTH 3  TIMES DAILY 270 capsule 3  . glipiZIDE (GLUCOTROL) 5 MG tablet TAKE ONE-HALF TABLET BY  MOUTH 2 TIMES DAILY BEFORE  A MEAL. (Patient taking differently: Take 2.5 mg by mouth 2 (two) times daily before a meal.) 90 tablet 3  . HYDROcodone-acetaminophen (NORCO/VICODIN) 5-325 MG tablet TAKE 1 TABLET BY MOUTH EVERY 6 HOUR AS NEEDED FOR PAIN 60 tablet 0  . hydrOXYzine (ATARAX/VISTARIL) 25 MG tablet TAKE ONE TABLET EVERY EIGHT HOURS AS NEEDED 90 tablet 3  . isosorbide mononitrate (IMDUR) 60 MG 24 hr tablet Take 60 mg by mouth daily before lunch.     . liraglutide (VICTOZA) 18 MG/3ML SOPN Inject 1.8 mg into the skin daily. 9 mL 11  . losartan (COZAAR) 100 MG tablet TAKE ONE TABLET EVERY DAY 90 tablet 0  . MAGNESIUM PO Take by mouth.    . metFORMIN (GLUCOPHAGE) 500 MG tablet TAKE 1 TABLET BY MOUTH TWICE DAILY 180 tablet 3  . methotrexate 2.5 MG tablet Take 6 tablets by mouth once a week.    . Multiple Vitamins-Minerals (MULTIVITAL) tablet Take 1 tablet by mouth daily.    Glory Rosebush ULTRA test strip CHECK BLOOD SUGAR 3 TIMES DAILY AS DIRECTED 100 each 1  . rosuvastatin (CRESTOR) 20 MG tablet TAKE  ONE TABLET EVERY DAY 90 tablet 3  . timolol (TIMOPTIC) 0.5 % ophthalmic solution Apply 1 drop to eye 2 (two) times daily.    . carvedilol (COREG) 12.5 MG tablet Take 1 tablet (12.5 mg total) by mouth 2 (two) times daily. 180 tablet 3   No current facility-administered medications on file prior to visit.    Allergies  Allergen Reactions  . Bee Venom Anaphylaxis  . Oxycodone Other (See Comments)    Delusions Delusions   Delusions  . Hydromorphone Other (See Comments)    hallucinating  . Zolpidem Other (See Comments)    Past Medical History:  Diagnosis Date  . Anemia    H/O  . Anxiety   . Arthritis   . Chronic kidney disease   . Complication of anesthesia   . CTCL  (cutaneous T-cell lymphoma) (HCC)   . Diabetes mellitus without complication ([place])   . Dysplastic nevus 12/19/2017   Right distal lat. forearm near wrist. Severe atypia, close to peripheral margin.  Marland Kitchen Dysplastic nevus 06/21/2018   Upper back right paraspinal. Severe atypia, peripheral margin involved. Excised 07/11/2018, margins free.  Marland Kitchen Dysrhythmia    A FLUTTER  . Family history of adverse reaction to anesthesia    PT WAS ADOPTED  . HLD (hyperlipidemia)   . HTN (hypertension)   . HTN (hypertension)   . Hx of dysplastic nevus 2019   multiple sites  . 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 MRSA (methicillin resistant Staphylococcus aureus)    after back surgery  . OSA (obstructive sleep apnea)    USES BIPAP  . Polio   . 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 PONV (postoperative nausea and vomiting)   . 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 forearm. SCCis, hypertrophic.    Past Surgical History:  Procedure Laterality Date  . BACK SURGERY     LUMBAR  . CARDIAC ELECTROPHYSIOLOGY STUDY AND ABLATION  2019  . COLONOSCOPY WITH PROPOFOL N/A 04/04/2019   Procedure: COLONOSCOPY WITH PROPOFOL;  Surgeon: Toledo, Benay Pike, MD;  Location: [facility];  Service: Gastroenterology;  Laterality: N/A;  . I & D EXTREMITY Right 02/01/2020   Procedure: IRRIGATION AND DEBRIDEMENT EXTREMITY with poly exchange;  Surgeon: Dereck Leep, MD;  Location: [facility];  Service: Orthopedics;  Laterality: Right;  . INCISION AND DRAINAGE     BACK-MRSA INFECTION AFTER BACK SURGERY  . KNEE ARTHROPLASTY Right 01/28/2020   Procedure: COMPUTER ASSISTED TOTAL KNEE ARTHROPLASTY;  Surgeon: Dereck Leep, MD;  Location: [facility];  Service: Orthopedics;  Laterality: Right;  . MOUTH SURGERY    . right elbow surgery    . right knee surgery    . right shoulder surgery     from polio damage  . TONSILLECTOMY    . TOTAL HIP ARTHROPLASTY Bilateral 04/2016     Family History  Adopted: Yes    Social History   Socioeconomic History  . Marital status: Married    Spouse name: Not on file  . Number of children: 0  . Years of education: Not on file  . Highest education level: Not on file  Occupational History  . 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 Occupation: Cytogeneticist    Comment: Retired  . Occupation: Warden/ranger    Comment: Retired  Tobacco Use  . Smoking status: Former Smoker    Types: Cigars  Quit date: 09/23/1990    Years since quitting: 30.2  . Smokeless tobacco: Former Systems developer    Quit date: 02/25/2006  Vaping Use  . Vaping Use: Never used  Substance and Sexual Activity  . Alcohol use: No    Alcohol/week: 0.0 standard drinks  . Drug use: No  . Sexual activity: Yes    Partners: Female  Other Topics Concern  . Not on file  Social History Narrative   Has living will   Wife is health care POA---then brother or sister   Would accept resuscitation attempts but no prolonged ventilation or tube feeds   Social Determinants of Health   Financial Resource Strain: Not on file  Food Insecurity: Not on file  Transportation Needs: Not on file  Physical Activity: Not on file  Stress: Not on file  Social Connections: Not on file  Intimate Partner Violence: Not on file   Review of Systems Weight up slightly No chest pain No SOB Sleeps okay---he sleeps in recliner (better than in bed) Wife notes no apnea or snoring without the CPAP    Objective:   Physical Exam Cardiovascular:     Rate and Rhythm: Normal rate and regular rhythm.     Heart sounds: No murmur heard. No gallop.   Pulmonary:     Effort: Pulmonary effort is normal.     Breath sounds: Normal breath sounds. No wheezing or rales.  Musculoskeletal:     Cervical back: Neck supple.     Right lower leg: No edema.     Left lower leg: No edema.  Lymphadenopathy:     Cervical: No cervical  adenopathy.  Skin:    Comments: No foot lesions  Neurological:     Comments: Fairly normal fine sensation in feet            Assessment & Plan:

## 2020-12-11 NOTE — Assessment & Plan Note (Signed)
Last GFR ~40 On losartan

## 2020-12-11 NOTE — Assessment & Plan Note (Signed)
Seems to have good control on liraglutide, glipizide and metformin  Lab Results  Component Value Date   HGBA1C 6.9 (A) 12/11/2020   Control is better

## 2020-12-11 NOTE — Assessment & Plan Note (Signed)
Reviewed PDMP No concerns 

## 2020-12-11 NOTE — Assessment & Plan Note (Signed)
Does okay with the hydrocodone celebrex also

## 2020-12-23 DIAGNOSIS — Z79899 Other long term (current) drug therapy: Secondary | ICD-10-CM | POA: Diagnosis not present

## 2020-12-23 DIAGNOSIS — C84A0 Cutaneous T-cell lymphoma, unspecified, unspecified site: Secondary | ICD-10-CM | POA: Diagnosis not present

## 2020-12-26 DIAGNOSIS — E1142 Type 2 diabetes mellitus with diabetic polyneuropathy: Secondary | ICD-10-CM | POA: Diagnosis not present

## 2020-12-26 DIAGNOSIS — B351 Tinea unguium: Secondary | ICD-10-CM | POA: Diagnosis not present

## 2020-12-26 DIAGNOSIS — M79675 Pain in left toe(s): Secondary | ICD-10-CM | POA: Diagnosis not present

## 2020-12-26 DIAGNOSIS — M79674 Pain in right toe(s): Secondary | ICD-10-CM | POA: Diagnosis not present

## 2021-01-08 ENCOUNTER — Other Ambulatory Visit: Payer: Self-pay | Admitting: Internal Medicine

## 2021-01-08 NOTE — Telephone Encounter (Signed)
Name of [place] Name of [place] or Written Date and Quantity:12-10-20#60 Last Office Visit and Type:12-11-20 Next Office Visit and Type:03-16-21 Last Controlled Substance Agreement Date:06-13-20 Last UDS:06-13-20

## 2021-01-09 ENCOUNTER — Other Ambulatory Visit: Payer: Self-pay | Admitting: Internal Medicine

## 2021-01-27 DIAGNOSIS — C84A0 Cutaneous T-cell lymphoma, unspecified, unspecified site: Secondary | ICD-10-CM | POA: Diagnosis not present

## 2021-01-27 DIAGNOSIS — G8929 Other chronic pain: Secondary | ICD-10-CM | POA: Diagnosis not present

## 2021-01-27 DIAGNOSIS — M25512 Pain in left shoulder: Secondary | ICD-10-CM | POA: Diagnosis not present

## 2021-01-27 DIAGNOSIS — Z79899 Other long term (current) drug therapy: Secondary | ICD-10-CM | POA: Diagnosis not present

## 2021-01-27 DIAGNOSIS — M0609 Rheumatoid arthritis without rheumatoid factor, multiple sites: Secondary | ICD-10-CM | POA: Diagnosis not present

## 2021-01-28 DIAGNOSIS — G4733 Obstructive sleep apnea (adult) (pediatric): Secondary | ICD-10-CM | POA: Diagnosis not present

## 2021-02-05 ENCOUNTER — Other Ambulatory Visit: Payer: Self-pay | Admitting: Internal Medicine

## 2021-02-12 ENCOUNTER — Other Ambulatory Visit: Payer: Self-pay | Admitting: Internal Medicine

## 2021-02-12 NOTE — Telephone Encounter (Signed)
Name of Medication: Hydrocodone Name of Pharmacy: [place] or Written Date and Quantity: 01-09-21 #60 Last Office Visit and Type: 12-11-20 Next Office Visit and Type: 03-16-21 Last Controlled Substance Agreement Date: 06-13-20 Last UDS: 06-13-20

## 2021-02-18 ENCOUNTER — Other Ambulatory Visit: Payer: Self-pay | Admitting: Internal Medicine

## 2021-02-18 NOTE — Telephone Encounter (Signed)
Last filled 12-01-20 #60 Last OV 12-11-20 Next OV 03-16-21 [facility]

## 2021-03-06 DIAGNOSIS — Z96651 Presence of right artificial knee joint: Secondary | ICD-10-CM | POA: Diagnosis not present

## 2021-03-16 ENCOUNTER — Encounter: Payer: Self-pay | Admitting: Internal Medicine

## 2021-03-16 ENCOUNTER — Other Ambulatory Visit: Payer: Self-pay

## 2021-03-16 ENCOUNTER — Ambulatory Visit (INDEPENDENT_AMBULATORY_CARE_PROVIDER_SITE_OTHER): Payer: Medicare Other | Admitting: Internal Medicine

## 2021-03-16 DIAGNOSIS — F1120 Opioid dependence, uncomplicated: Secondary | ICD-10-CM | POA: Diagnosis not present

## 2021-03-16 DIAGNOSIS — M545 Low back pain, unspecified: Secondary | ICD-10-CM

## 2021-03-16 DIAGNOSIS — G8929 Other chronic pain: Secondary | ICD-10-CM | POA: Diagnosis not present

## 2021-03-16 NOTE — Progress Notes (Signed)
Subjective:    Patient ID: Daniel Kline, male    DOB: Feb 08, 1946, 75 y.o.   MRN: [id number]  HPI Here for follow up of chronic pain and narcotic dependence This visit occurred during the SARS-CoV-2 public health emergency.  Safety protocols were in place, including screening questions prior to the visit, additional usage of staff PPE, and extensive cleaning of exam room while observing appropriate contact time as indicated for disinfecting solutions.   Has been working on wall sits (can do it a minute) Back and shoulder pain is about the same Tends to get tight in his low back walking in store with a cart Hydrocodone once and sometimes twice a day  Sugars still okay--- 121 after breakfast this morning  Current Outpatient Medications on File Prior to Visit  Medication Sig Dispense Refill   amLODipine (NORVASC) 10 MG tablet TAKE 1 TABLET BY MOUTH  DAILY 90 tablet 3   celecoxib (CELEBREX) 200 MG capsule TAKE 1 CAPSULE BY MOUTH EVERY DAY 30 capsule 11   Cholecalciferol (VITAMIN D-3 PO) Take 10,000 Units by mouth daily.      clobetasol cream (TEMOVATE) 3.81 % Apply 1 application topically 2 (two) times daily as needed (irritation).      clorazepate (TRANXENE) 7.5 MG tablet TAKE 1 TABLET (7.5 MG TOTAL) BY MOUTH 2 (TWO) TIMES DAILY AS NEEDED FOR ANXIETY. 60 tablet 0   diclofenac Sodium (VOLTAREN) 1 % GEL APPLY 2 GRAMS TOPICALLY FOUR TIMES DAILY 100 g 11   Elastic Bandages & Supports (MEDICAL COMPRESSION STOCKINGS) MISC 2 Units by Does not apply route daily. 2 each 0   EPINEPHrine 0.3 mg/0.3 mL IJ SOAJ injection Inject 0.3 mLs (0.3 mg total) into the muscle as needed for anaphylaxis. 1 each 5   Ferrous Sulfate (IRON PO) Take 1 tablet by mouth at bedtime.     folic acid (FOLVITE) 1 MG tablet Take 1 mg by mouth daily.     furosemide (LASIX) 40 MG tablet Take 40 mg by mouth 2 (two) times daily.     gabapentin (NEURONTIN) 300 MG capsule TAKE 1 CAPSULE BY MOUTH 3  TIMES DAILY 270 capsule 3    glipiZIDE (GLUCOTROL) 5 MG tablet TAKE ONE-HALF TABLET BY  MOUTH 2 TIMES DAILY BEFORE  A MEAL. 90 tablet 3   HYDROcodone-acetaminophen (NORCO/VICODIN) 5-325 MG tablet TAKE 1 TABLET BY MOUTH EVERY SIX HOURS AS NEEDED FOR PAIN 60 tablet 0   hydroxychloroquine (PLAQUENIL) 200 MG tablet Take 1 tablet by mouth 2 (two) times daily.     hydrOXYzine (ATARAX/VISTARIL) 25 MG tablet TAKE ONE TABLET EVERY EIGHT HOURS AS NEEDED 90 tablet 3   isosorbide mononitrate (IMDUR) 60 MG 24 hr tablet Take 60 mg by mouth daily before lunch.      liraglutide (VICTOZA) 18 MG/3ML SOPN Inject 1.8 mg into the skin daily. 9 mL 11   losartan (COZAAR) 100 MG tablet TAKE ONE TABLET EVERY DAY 90 tablet 0   MAGNESIUM PO Take by mouth.     metFORMIN (GLUCOPHAGE) 500 MG tablet TAKE 1 TABLET BY MOUTH TWICE DAILY 180 tablet 3   Multiple Vitamins-Minerals (MULTIVITAL) tablet Take 1 tablet by mouth daily.     ONETOUCH ULTRA test strip CHECK BLOOD SUGAR 3 TIMES DAILY AS DIRECTED 100 each 1   rosuvastatin (CRESTOR) 20 MG tablet TAKE 1 TABLET BY MOUTH  DAILY 90 tablet 3   timolol (TIMOPTIC) 0.5 % ophthalmic solution Apply 1 drop to eye 2 (two) times daily.  carvedilol (COREG) 12.5 MG tablet Take 1 tablet (12.5 mg total) by mouth 2 (two) times daily. 180 tablet 3   No current facility-administered medications on file prior to visit.    Allergies  Allergen Reactions   Bee Venom Anaphylaxis   Oxycodone Other (See Comments)    Delusions Delusions   Delusions   Hydromorphone Other (See Comments)    hallucinating   Zolpidem Other (See Comments)    Past Medical History:  Diagnosis Date   Anemia    H/O   Anxiety    Arthritis    Chronic kidney disease    Complication of anesthesia    CTCL (cutaneous T-cell lymphoma) (HCC)    Diabetes mellitus without complication ([place])    Dysplastic nevus 12/19/2017   Right distal lat. forearm near wrist. Severe atypia, close to peripheral margin.   Dysplastic nevus 06/21/2018   Upper  back right paraspinal. Severe atypia, peripheral margin involved. Excised 07/11/2018, margins free.   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 forearm. SCCis, hypertrophic.    Past Surgical History:  Procedure Laterality Date   BACK SURGERY     LUMBAR   CARDIAC ELECTROPHYSIOLOGY STUDY AND ABLATION  2019   COLONOSCOPY WITH PROPOFOL N/A 04/04/2019   Procedure: COLONOSCOPY WITH PROPOFOL;  Surgeon: Toledo, Benay Pike, MD;  Location: [facility];  Service: Gastroenterology;  Laterality: N/A;   I & D EXTREMITY Right 02/01/2020   Procedure: IRRIGATION AND DEBRIDEMENT EXTREMITY with poly exchange;  Surgeon: Dereck Leep, MD;  Location: [facility];  Service: Orthopedics;  Laterality: Right;   INCISION AND DRAINAGE     BACK-MRSA INFECTION AFTER BACK SURGERY   KNEE ARTHROPLASTY Right 01/28/2020   Procedure: COMPUTER ASSISTED TOTAL KNEE ARTHROPLASTY;  Surgeon: Dereck Leep, MD;  Location: [facility] ORS;  Service: Orthopedics;  Laterality: Right;   MOUTH SURGERY     right elbow surgery     right knee surgery     right shoulder surgery     from polio damage   TONSILLECTOMY     TOTAL HIP ARTHROPLASTY Bilateral 04/2016    Family History  Adopted: Yes    Social History   Socioeconomic History   Marital status: Married    Spouse name: Not on file   Number of children: 0   Years of education: Not on file   Highest education level: Not on file  Occupational History    Occupation: Nature conservation officer    Comment: 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   Occupation: Cytogeneticist    Comment: Retired   Occupation: Warden/ranger    Comment: Retired  Tobacco Use   Smoking status: Former    Types: Cigars    Quit date: 09/23/1990    Years since quitting: 30.4   Smokeless tobacco: Former    Quit date: 02/25/2006  Vaping Use   Vaping  Use: Never used  Substance and Sexual Activity   Alcohol use: No    Alcohol/week: 0.0 standard drinks   Drug use: No   Sexual activity: Yes    Partners: Female  Other Topics Concern   Not on file  Social History Narrative   Has living will   Wife is health care POA---then brother or sister   Would accept resuscitation attempts but no prolonged ventilation or tube feeds   Social Determinants of Health   Financial Resource Strain: Not on file  Food Insecurity: Not on file  Transportation Needs: Not on file  Physical Activity: Not on file  Stress: Not on file  Social Connections: Not on file  Intimate Partner Violence: Not on file   Review of Systems Had right side weakness from polio as a child Some chronic weakness in deltoid/biceps and some of triceps from this    Objective:   Physical Exam Constitutional:      Appearance: Normal appearance.  Neurological:     Mental Status: He is alert.  Psychiatric:        Mood and Affect: Mood normal.        Behavior: Behavior normal.           Assessment & Plan:

## 2021-03-16 NOTE — Assessment & Plan Note (Signed)
Stable Some trouble--especially walking in the store Uses the hydrocodone once or twice a day Some arm/shoulder pain also

## 2021-03-16 NOTE — Assessment & Plan Note (Signed)
PDMP reviewed No concerns 

## 2021-03-17 ENCOUNTER — Other Ambulatory Visit: Payer: Self-pay | Admitting: Internal Medicine

## 2021-03-18 NOTE — Telephone Encounter (Signed)
Name of Medication: Hydrocodone Name of Pharmacy: [facility] or Written Date and Quantity: 02-12-21 #60 Last Office Visit and Type: 03-16-21 Next Office Visit and Type: 06-18-21 Last Controlled Substance Agreement Date: 06-13-20 Last UDS: 06-13-20

## 2021-03-30 DIAGNOSIS — E1122 Type 2 diabetes mellitus with diabetic chronic kidney disease: Secondary | ICD-10-CM | POA: Diagnosis not present

## 2021-03-30 DIAGNOSIS — E1142 Type 2 diabetes mellitus with diabetic polyneuropathy: Secondary | ICD-10-CM | POA: Diagnosis not present

## 2021-03-30 DIAGNOSIS — N2581 Secondary hyperparathyroidism of renal origin: Secondary | ICD-10-CM | POA: Diagnosis not present

## 2021-03-30 DIAGNOSIS — D631 Anemia in chronic kidney disease: Secondary | ICD-10-CM | POA: Diagnosis not present

## 2021-03-30 DIAGNOSIS — B351 Tinea unguium: Secondary | ICD-10-CM | POA: Diagnosis not present

## 2021-03-30 DIAGNOSIS — R809 Proteinuria, unspecified: Secondary | ICD-10-CM | POA: Diagnosis not present

## 2021-03-30 DIAGNOSIS — N2889 Other specified disorders of kidney and ureter: Secondary | ICD-10-CM | POA: Diagnosis not present

## 2021-04-02 ENCOUNTER — Other Ambulatory Visit: Payer: Self-pay | Admitting: Family Medicine

## 2021-04-02 NOTE — Telephone Encounter (Signed)
LMOV  

## 2021-04-02 NOTE — Telephone Encounter (Signed)
Please contact pt for future appointment. Pt overdue for 12 month f/u. Pt needing refills. 

## 2021-04-06 DIAGNOSIS — D631 Anemia in chronic kidney disease: Secondary | ICD-10-CM | POA: Diagnosis not present

## 2021-04-06 DIAGNOSIS — N1832 Chronic kidney disease, stage 3b: Secondary | ICD-10-CM | POA: Diagnosis not present

## 2021-04-06 DIAGNOSIS — E1122 Type 2 diabetes mellitus with diabetic chronic kidney disease: Secondary | ICD-10-CM | POA: Diagnosis not present

## 2021-04-06 DIAGNOSIS — R809 Proteinuria, unspecified: Secondary | ICD-10-CM | POA: Diagnosis not present

## 2021-04-06 DIAGNOSIS — N2581 Secondary hyperparathyroidism of renal origin: Secondary | ICD-10-CM | POA: Diagnosis not present

## 2021-04-06 DIAGNOSIS — I129 Hypertensive chronic kidney disease with stage 1 through stage 4 chronic kidney disease, or unspecified chronic kidney disease: Secondary | ICD-10-CM | POA: Diagnosis not present

## 2021-04-14 ENCOUNTER — Other Ambulatory Visit (INDEPENDENT_AMBULATORY_CARE_PROVIDER_SITE_OTHER): Payer: Self-pay | Admitting: Nurse Practitioner

## 2021-04-14 DIAGNOSIS — I739 Peripheral vascular disease, unspecified: Secondary | ICD-10-CM

## 2021-04-15 ENCOUNTER — Encounter (INDEPENDENT_AMBULATORY_CARE_PROVIDER_SITE_OTHER): Payer: Self-pay

## 2021-04-15 ENCOUNTER — Encounter (INDEPENDENT_AMBULATORY_CARE_PROVIDER_SITE_OTHER): Payer: Self-pay | Admitting: Nurse Practitioner

## 2021-04-20 ENCOUNTER — Other Ambulatory Visit: Payer: Self-pay | Admitting: Internal Medicine

## 2021-04-20 NOTE — Telephone Encounter (Signed)
Name of Medication: Hydrocodone Name of Pharmacy: [facility] or Written Date and Quantity: 03-18-21 #60 Last Office Visit and Type: 03-16-21 Next Office Visit and Type: 06-18-21 Last Controlled Substance Agreement Date: 06-13-20 Last UDS: 06-13-20

## 2021-04-22 ENCOUNTER — Other Ambulatory Visit: Payer: Self-pay | Admitting: Internal Medicine

## 2021-04-23 NOTE — Telephone Encounter (Signed)
Last filled 02-18-21 #60 Last OV 03-16-21 Next OV 06-18-21 [facility]

## 2021-04-29 DIAGNOSIS — Z79899 Other long term (current) drug therapy: Secondary | ICD-10-CM | POA: Diagnosis not present

## 2021-04-29 DIAGNOSIS — N1832 Chronic kidney disease, stage 3b: Secondary | ICD-10-CM | POA: Diagnosis not present

## 2021-04-29 DIAGNOSIS — M0609 Rheumatoid arthritis without rheumatoid factor, multiple sites: Secondary | ICD-10-CM | POA: Diagnosis not present

## 2021-04-29 DIAGNOSIS — G8929 Other chronic pain: Secondary | ICD-10-CM | POA: Diagnosis not present

## 2021-04-29 DIAGNOSIS — M25512 Pain in left shoulder: Secondary | ICD-10-CM | POA: Diagnosis not present

## 2021-04-30 ENCOUNTER — Other Ambulatory Visit: Payer: Self-pay | Admitting: Internal Medicine

## 2021-05-01 ENCOUNTER — Other Ambulatory Visit: Payer: Self-pay | Admitting: Cardiovascular Disease

## 2021-05-05 DIAGNOSIS — C8400 Mycosis fungoides, unspecified site: Secondary | ICD-10-CM | POA: Diagnosis not present

## 2021-05-05 DIAGNOSIS — L986 Other infiltrative disorders of the skin and subcutaneous tissue: Secondary | ICD-10-CM | POA: Diagnosis not present

## 2021-05-08 ENCOUNTER — Encounter (INDEPENDENT_AMBULATORY_CARE_PROVIDER_SITE_OTHER): Payer: Self-pay

## 2021-05-08 ENCOUNTER — Encounter (INDEPENDENT_AMBULATORY_CARE_PROVIDER_SITE_OTHER): Payer: Self-pay | Admitting: Nurse Practitioner

## 2021-05-09 NOTE — Progress Notes (Signed)
Cardiology Office Note  Date:  05/11/2021   ID:  Daniel Kline, DOB July 25, 1946, MRN [id number]  PCP:  [name], MD   Chief Complaint  Patient presents with   Follow-up    12 month    HPI:  [name] is a 75 year old gentleman with past medical history of Diabetes Chronic pain Obstructive sleep apnea, uses CPAP CRI, followed by Dr. Abigail Butts, CR 2.3 F/u for his  Hypertension, atrial flutter s/p ablation 04/2018 at [place] seen in clinic July 2021 by myself  Working out at [employer], biking  [facility] at [facility] to see dermatology,  Back getting tight, numb in buttocks  HBA1C 6.9 CR 2.3 Takes aleve PRN  Chronic leg swelling, on TEDS On amlodipine  Has completed right total knee arthroplasty with Dr. Marry Guan January 28, 2020 Post procedure complication of bleeding, lovenox Right knee arthrotomy, evacuation of hematoma, irrigation and debridement, No evidence of DVT.Prevena woundvac  One blood transfusion for HGB 6.9  Medications reviewed with him in detail  amlodipine, carvedilol,  losartan, Lasix  EKG personally reviewed by myself on todays visit Shows normal sinus rhythm rate 71 bpm no significant ST or T wave changes No change   PMH:   has a past medical history of Anemia, Anxiety, Arthritis, Chronic kidney disease, Complication of anesthesia, CTCL (cutaneous T-cell lymphoma) ([place]), Diabetes mellitus without complication ([place]), Dysplastic nevus (12/19/2017), Dysplastic nevus (06/21/2018), Dysrhythmia, Family history of adverse reaction to anesthesia, HLD (hyperlipidemia), HTN (hypertension), HTN (hypertension), dysplastic nevus (2019), squamous cell carcinoma (01/18/2018), MRSA (methicillin resistant Staphylococcus aureus), OSA (obstructive sleep apnea), Polio, 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  PSH:    Past Surgical History:  Procedure  Laterality Date   BACK SURGERY     LUMBAR   CARDIAC ELECTROPHYSIOLOGY STUDY AND ABLATION  2019   COLONOSCOPY WITH PROPOFOL N/A 04/04/2019   Procedure: COLONOSCOPY WITH PROPOFOL;  Surgeon: Toledo, Benay Pike, MD;  Location: [facility];  Service: Gastroenterology;  Laterality: N/A;   I & D EXTREMITY Right 02/01/2020   Procedure: IRRIGATION AND DEBRIDEMENT EXTREMITY with poly exchange;  Surgeon: Dereck Leep, MD;  Location: [facility];  Service: Orthopedics;  Laterality: Right;   INCISION AND DRAINAGE     BACK-MRSA INFECTION AFTER BACK SURGERY   KNEE ARTHROPLASTY Right 01/28/2020   Procedure: COMPUTER ASSISTED TOTAL KNEE ARTHROPLASTY;  Surgeon: Dereck Leep, MD;  Location: [facility];  Service: Orthopedics;  Laterality: Right;   MOUTH SURGERY     right elbow surgery     right knee surgery     right shoulder surgery     from polio damage   TONSILLECTOMY     TOTAL HIP ARTHROPLASTY Bilateral 04/2016    Current Outpatient Medications  Medication Sig Dispense Refill   amLODipine (NORVASC) 10 MG tablet TAKE 1 TABLET BY MOUTH  DAILY 90 tablet 3   carvedilol (COREG) 12.5 MG tablet TAKE ONE (1) TABLET BY MOUTH TWO TIMES PER DAY 60 tablet 0   celecoxib (CELEBREX) 200 MG capsule TAKE 1 CAPSULE BY MOUTH EVERY DAY 30 capsule 11   Cholecalciferol (VITAMIN D-3 PO) Take 10,000 Units by mouth daily.      clobetasol cream (TEMOVATE) 1.06 % Apply 1 application topically 2 (two) times daily as needed (irritation).      clorazepate (TRANXENE) 7.5 MG tablet TAKE 1 TABLET (7.5 MG TOTAL) BY MOUTH 2 (TWO) TIMES DAILY AS NEEDED FOR ANXIETY. [place]  tablet 0   diclofenac Sodium (VOLTAREN) 1 % GEL APPLY 2 GRAMS TOPICALLY FOUR TIMES DAILY 100 g 11   Elastic Bandages & Supports (MEDICAL COMPRESSION STOCKINGS) MISC 2 Units by Does not apply route daily. 2 each 0   EPINEPHrine 0.3 mg/0.3 mL IJ SOAJ injection Inject 0.3 mLs (0.3 mg total) into the muscle as needed for anaphylaxis. 1 each 5   Ferrous Sulfate (IRON PO) Take  1 tablet by mouth at bedtime.     folic acid (FOLVITE) 1 MG tablet Take 1 mg by mouth daily.     furosemide (LASIX) 40 MG tablet Take 40 mg by mouth 2 (two) times daily.     gabapentin (NEURONTIN) 300 MG capsule TAKE 1 CAPSULE BY MOUTH 3  TIMES DAILY 270 capsule 3   glipiZIDE (GLUCOTROL) 5 MG tablet TAKE ONE-HALF TABLET BY  MOUTH 2 TIMES DAILY BEFORE  A MEAL. 90 tablet 3   HYDROcodone-acetaminophen (NORCO/VICODIN) 5-325 MG tablet TAKE 1 TABLET BY MOUTH EVERY SIX HOURS AS NEEDED FOR PAIN 60 tablet 0   hydroxychloroquine (PLAQUENIL) 200 MG tablet Take 1 tablet by mouth 2 (two) times daily.     hydrOXYzine (ATARAX/VISTARIL) 25 MG tablet TAKE ONE TABLET EVERY EIGHT HOURS AS NEEDED 90 tablet 3   isosorbide mononitrate (IMDUR) 60 MG 24 hr tablet Take 60 mg by mouth daily before lunch.      liraglutide (VICTOZA) 18 MG/3ML SOPN Inject 1.8 mg into the skin daily. 9 mL 11   losartan (COZAAR) 100 MG tablet TAKE ONE TABLET EVERY DAY 90 tablet 0   MAGNESIUM PO Take by mouth.     metFORMIN (GLUCOPHAGE) 500 MG tablet TAKE 1 TABLET BY MOUTH TWICE DAILY 180 tablet 3   Multiple Vitamins-Minerals (MULTIVITAL) tablet Take 1 tablet by mouth daily.     ONETOUCH ULTRA test strip CHECK BLOOD SUGAR 3 TIMES DAILY AS DIRECTED 100 each 1   rosuvastatin (CRESTOR) 20 MG tablet TAKE 1 TABLET BY MOUTH  DAILY 90 tablet 3   timolol (TIMOPTIC) 0.5 % ophthalmic solution Apply 1 drop to eye 2 (two) times daily.     No current facility-administered medications for this visit.     Allergies:   Bee venom, Oxycodone, Hydromorphone, and Zolpidem   Social History:  The patient  reports that he quit smoking about 30 years ago. His smoking use included cigars. He quit smokeless tobacco use about 15 years ago. He reports that he does not drink alcohol and does not use drugs.   Family History:   family history is not on file. He was adopted.    Review of Systems: Review of Systems  Constitutional: Negative.   HENT: Negative.     Respiratory: Negative.    Cardiovascular:  Positive for leg swelling.  Gastrointestinal: Negative.   Musculoskeletal:  Positive for joint pain.  Neurological: Negative.   Psychiatric/Behavioral: Negative.    All other systems reviewed and are negative.  PHYSICAL EXAM: VS:  BP (!) 142/68   Pulse 71   Ht 5\' 7"  (1.702 m)   Wt 247 lb (112 kg)   SpO2 93%   BMI 38.69 kg/m  , BMI Body mass index is 38.69 kg/m. Constitutional:  oriented to person, place, and time. No distress.  HENT:  Head: Grossly normal Eyes:  no discharge. No scleral icterus.  Neck: No JVD, no carotid bruits  Cardiovascular: Regular rate and rhythm, no murmurs appreciated Trace to 1 + b/l edema Pulmonary/Chest: Clear to auscultation bilaterally, no wheezes or rails  Abdominal: Soft.  no distension.  no tenderness.  Musculoskeletal: Normal range of motion Neurological:  normal muscle tone. Coordination normal. No atrophy Skin: Skin warm and dry Psychiatric: normal affect, pleasant    Recent Labs: No results found for requested labs within last 8760 hours.    Lipid Panel Lab Results  Component Value Date   CHOL 100 03/10/2020   HDL 31.90 (L) 03/10/2020   LDLCALC 38 03/10/2020   TRIG 148.0 03/10/2020      Wt Readings from Last 3 Encounters:  05/11/21 247 lb (112 kg)  03/16/21 245 lb (111.1 kg)  12/11/20 243 lb (110.2 kg)       ASSESSMENT AND PLAN:  Atrial flutter Not on anticoagulation, had ablation in 2019 Maintaining normal sinus rhythm  Diabetes mellitus with nephropathy ([place]) - Plan: EKG 12-Lead Stressed importance of low carbohydrate diet We have encouraged continued exercise, careful diet management in an effort to lose weight. A1C well controlled  CKD (chronic kidney disease), stage II Rising CR 2.3 Followed by Dr Abigail Butts On lasix 40 BID Avoid aleve  Essential hypertension - Plan: EKG 12-Lead Blood pressure is well controlled on today's visit. No changes made to the  medications.  OSA (obstructive sleep apnea) Compliant with his CPAP    Total encounter time more than 25 minutes  Greater than 50% was spent in counseling and coordination of care with the patient   Orders Placed This Encounter  Procedures   EKG 12-Lead     Signed, Esmond Plants, M.D., Ph.D. 05/11/2021  [facility] [place], [place] [phone]

## 2021-05-11 ENCOUNTER — Encounter: Payer: Self-pay | Admitting: Cardiovascular Disease

## 2021-05-11 ENCOUNTER — Ambulatory Visit: Payer: Medicare Other | Admitting: Cardiovascular Disease

## 2021-05-11 ENCOUNTER — Other Ambulatory Visit: Payer: Self-pay

## 2021-05-11 VITALS — BP 142/68 | HR 71 | Ht 67.0 in | Wt 247.0 lb

## 2021-05-11 DIAGNOSIS — N182 Chronic kidney disease, stage 2 (mild): Secondary | ICD-10-CM

## 2021-05-11 DIAGNOSIS — E782 Mixed hyperlipidemia: Secondary | ICD-10-CM

## 2021-05-11 DIAGNOSIS — I10 Essential (primary) hypertension: Secondary | ICD-10-CM | POA: Diagnosis not present

## 2021-05-11 DIAGNOSIS — I483 Typical atrial flutter: Secondary | ICD-10-CM | POA: Diagnosis not present

## 2021-05-11 DIAGNOSIS — E1121 Type 2 diabetes mellitus with diabetic nephropathy: Secondary | ICD-10-CM | POA: Diagnosis not present

## 2021-05-11 DIAGNOSIS — G4733 Obstructive sleep apnea (adult) (pediatric): Secondary | ICD-10-CM

## 2021-05-11 MED ORDER — ISOSORBIDE MONONITRATE ER 60 MG PO TB24: 60.0000 mg | ORAL_TABLET | Freq: Every day | ORAL | 3 refills | Status: DC

## 2021-05-11 MED ORDER — LOSARTAN POTASSIUM 100 MG PO TABS: 100.0000 mg | ORAL_TABLET | Freq: Every day | ORAL | 3 refills | Status: DC

## 2021-05-11 MED ORDER — AMLODIPINE BESYLATE 10 MG PO TABS: 10.0000 mg | ORAL_TABLET | Freq: Every day | ORAL | 3 refills | Status: DC

## 2021-05-11 MED ORDER — CARVEDILOL 12.5 MG PO TABS: ORAL_TABLET | ORAL | 3 refills | Status: DC

## 2021-05-11 MED ORDER — FUROSEMIDE 40 MG PO TABS: 40.0000 mg | ORAL_TABLET | Freq: Two times a day (BID) | ORAL | 3 refills | Status: DC

## 2021-05-11 NOTE — Patient Instructions (Addendum)
Avoid Aleve (NSAIDs)  Medication Instructions:  No changes  If you need a refill on your cardiac medications before your next appointment, please call your pharmacy.   Lab work: No new labs needed  Testing/Procedures: No new testing needed  Follow-Up: At [facility], you and your health needs are our priority.  As part of our continuing mission to provide you with exceptional heart care, we have created designated Provider Care Teams.  These Care Teams include your primary Cardiologist (physician) and Advanced Practice Providers (APPs -  Physician Assistants and Nurse Practitioners) who all work together to provide you with the care you need, when you need it.  You will need a follow up appointment in 12 months  Providers on your designated Care Team:   Murray Hodgkins, NP Christell Faith, PA-C Marrianne Mood, PA-C Cadence [place], [place]  COVID-19 Vaccine Information can be found at: [email or web address] For questions related to vaccine distribution or appointments, please email [email or web address] or call [phone].

## 2021-05-18 ENCOUNTER — Ambulatory Visit (INDEPENDENT_AMBULATORY_CARE_PROVIDER_SITE_OTHER): Payer: Medicare Other

## 2021-05-18 ENCOUNTER — Other Ambulatory Visit: Payer: Self-pay

## 2021-05-18 ENCOUNTER — Ambulatory Visit (INDEPENDENT_AMBULATORY_CARE_PROVIDER_SITE_OTHER): Payer: Medicare Other | Admitting: Nurse Practitioner

## 2021-05-18 VITALS — BP 155/84 | HR 62 | Ht 67.0 in | Wt 246.0 lb

## 2021-05-18 DIAGNOSIS — R0989 Other specified symptoms and signs involving the circulatory and respiratory systems: Secondary | ICD-10-CM

## 2021-05-18 DIAGNOSIS — I10 Essential (primary) hypertension: Secondary | ICD-10-CM | POA: Diagnosis not present

## 2021-05-18 DIAGNOSIS — E1121 Type 2 diabetes mellitus with diabetic nephropathy: Secondary | ICD-10-CM

## 2021-05-18 DIAGNOSIS — I739 Peripheral vascular disease, unspecified: Secondary | ICD-10-CM | POA: Diagnosis not present

## 2021-05-18 NOTE — Progress Notes (Signed)
Subjective:    Patient ID: ASHTEN PRATS, male    DOB: 02-20-1946, 75 y.o.   MRN: [id number] Chief Complaint  Patient presents with   New Patient (Initial Visit)    NP baker PVD ,abi     Malcom Selmer is a 75 year old male that presents today as a referral from Dr. Luana Shu his podiatrist with concern for decreased pedal pulses.  Patient notes that he denies any claudication-like symptoms.  In fact he notes that he right exercises regularly.  He denies any open wounds or ulcerations.  He denies any rest pain like symptoms.  Today noninvasive studies show an ABI of 1.05 on the right and 1.10 on the left.  He has a TBI of 0.95 on the right and 0.91 on the left.  He has triphasic tibial artery waveforms with good toe waveforms bilaterally.   Review of Systems  Cardiovascular:  Negative for leg swelling.  All other systems reviewed and are negative.     Objective:   Physical Exam Vitals reviewed.  HENT:     Head: Normocephalic.  Cardiovascular:     Rate and Rhythm: Normal rate.     Pulses: Decreased pulses.  Pulmonary:     Effort: Pulmonary effort is normal.  Skin:    General: Skin is warm and dry.  Neurological:     Mental Status: He is alert and oriented to person, place, and time.  Psychiatric:        Mood and Affect: Mood normal.        Behavior: Behavior normal.        Thought Content: Thought content normal.        Judgment: Judgment normal.    BP (!) 155/84   Pulse 62   Ht 5\' 7"  (1.702 m)   Wt 246 lb (111.6 kg)   BMI 38.53 kg/m   Past Medical History:  Diagnosis Date   Anemia    H/O   Anxiety    Arthritis    Chronic kidney disease    Complication of anesthesia    CTCL (cutaneous T-cell lymphoma) (HCC)    Diabetes mellitus without complication (HCC)    Dysplastic nevus 12/19/2017   Right distal lat. forearm near wrist. Severe atypia, close to peripheral margin.   Dysplastic nevus 06/21/2018   Upper back right paraspinal. Severe atypia, peripheral  margin involved. Excised 07/11/2018, margins free.   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 forearm. SCCis, hypertrophic.    Social History   Socioeconomic History   Marital status: Married    Spouse name: Not on file   Number of children: 0   Years of education: Not on file   Highest education level: Not on file  Occupational History   Occupation: Nature conservation officer    Comment: 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   Occupation: Cytogeneticist  Comment: Retired   Occupation: Warden/ranger    Comment: Retired  Tobacco Use   Smoking status: Former    Types: Cigars    Quit date: 09/23/1990    Years since quitting: 30.6   Smokeless tobacco: Former    Quit date: 02/25/2006  Vaping Use   Vaping Use: Never used  Substance and Sexual Activity   Alcohol use: No    Alcohol/week: 0.0 standard drinks   Drug use: No   Sexual activity: Yes    Partners: Female  Other Topics Concern   Not on file  Social History Narrative   Has living will   Wife is health care POA---then brother or sister   Would accept resuscitation attempts but no prolonged ventilation or tube feeds   Social Determinants of Health   Financial Resource Strain: Not on file  Food Insecurity: Not on file  Transportation Needs: Not on file  Physical Activity: Not on file  Stress: Not on file  Social Connections: Not on file  Intimate Partner Violence: Not on file     Past Surgical History:  Procedure Laterality Date   BACK SURGERY     LUMBAR   CARDIAC ELECTROPHYSIOLOGY STUDY AND ABLATION  2019   COLONOSCOPY WITH PROPOFOL N/A 04/04/2019   Procedure: COLONOSCOPY WITH PROPOFOL;  Surgeon: Toledo, Benay Pike, MD;  Location: [facility];  Service: Gastroenterology;  Laterality: N/A;   I & D EXTREMITY Right 02/01/2020   Procedure: IRRIGATION AND DEBRIDEMENT EXTREMITY with poly exchange;  Surgeon: Dereck Leep, MD;  Location: [facility];  Service: Orthopedics;  Laterality: Right;   INCISION AND DRAINAGE     BACK-MRSA INFECTION AFTER BACK SURGERY   KNEE ARTHROPLASTY Right 01/28/2020   Procedure: COMPUTER ASSISTED TOTAL KNEE ARTHROPLASTY;  Surgeon: Dereck Leep, MD;  Location: [facility] ORS;  Service: Orthopedics;  Laterality: Right;   MOUTH SURGERY     right elbow surgery     right knee surgery     right shoulder surgery     from polio damage   TONSILLECTOMY     TOTAL HIP ARTHROPLASTY Bilateral 04/2016    Family History  Adopted: Yes    Allergies  Allergen Reactions   Bee Venom Anaphylaxis   Oxycodone Other (See Comments)    Delusions Delusions   Delusions   Hydromorphone Other (See Comments)    hallucinating   Zolpidem Other (See Comments)    CBC Latest Ref Rng & Units 03/10/2020 02/03/2020 02/03/2020  WBC 4.0 - 10.5 K/uL 4.9 - 6.6  Hemoglobin 13.0 - 17.0 g/dL 11.7(L) 8.0(L) 6.9(L)  Hematocrit 39.0 - 52.0 % 36.2(L) - 20.6(L)  Platelets 150.0 - 400.0 K/uL 190.0 - 169      CMP     Component Value Date/Time   NA 138 03/10/2020 1632   NA 141 10/28/2011 0325   K 4.5 03/10/2020 1632   K 3.9 10/28/2011 0325   CL 103 03/10/2020 1632   CL 106 10/28/2011 0325   CO2 31 03/10/2020 1632   CO2 25 10/28/2011 0325   GLUCOSE 74 03/10/2020 1632   GLUCOSE 121 (H) 10/28/2011 0325   BUN 20 03/10/2020 1632   BUN 22 (H) 10/28/2011 0325   CREATININE 1.54 (H) 03/10/2020 1632   CREATININE 1.27 (H) 02/09/2016 1421   CALCIUM 9.9 03/10/2020 1632    CALCIUM 7.9 (L) 10/28/2011 0325   PROT 6.6 03/10/2020 1632   ALBUMIN 4.4 03/10/2020 1632   ALBUMIN 4.4 03/10/2020 1632   AST 15 03/10/2020 1632  ALT 13 03/10/2020 1632   ALKPHOS 72 03/10/2020 1632   BILITOT 0.4 03/10/2020 1632   GFRNONAA 37 (L) 02/01/2020 1313   GFRNONAA 56 (L) 02/09/2016 1421   GFRAA 43 (L) 02/01/2020 1313   GFRAA 65 02/09/2016 1421     VAS [place] ABI WITH/WO TBI  Result Date: 05/18/2021  LOWER EXTREMITY DOPPLER STUDY Patient Name:  DYLLIN GULLEY  Date of Exam:   05/18/2021 Medical Rec #: [id number]         Accession #:    [id number] Date of Birth: July 25, 1946         Patient Gender: M Patient Age:   47 years Exam Location:  [facility] Procedure:      VAS [place] ABI WITH/WO TBI Referring Phys: Eulogio Ditch --------------------------------------------------------------------------------  Indications: Clinically PCP suggested diminished pulses.  Performing Technologist: Concha Norway RVT  Examination Guidelines: A complete evaluation includes at minimum, Doppler waveform signals and systolic blood pressure reading at the level of bilateral brachial, anterior tibial, and posterior tibial arteries, when vessel segments are accessible. Bilateral testing is considered an integral part of a complete examination. Photoelectric Plethysmograph (PPG) waveforms and toe systolic pressure readings are included as required and additional duplex testing as needed. Limited examinations for reoccurring indications may be performed as noted.  ABI Findings: +---------+------------------+-----+---------+-------------+ Right    Rt Pressure (mmHg)IndexWaveform Comment       +---------+------------------+-----+---------+-------------+ Brachial                                 Unable to use +---------+------------------+-----+---------+-------------+ ATA      183                    triphasic1.05          +---------+------------------+-----+---------+-------------+ PTA      183                1.05 triphasic              +---------+------------------+-----+---------+-------------+ Great Toe166               0.95 Normal                 +---------+------------------+-----+---------+-------------+ +---------+------------------+-----+---------+-------+ Left     Lt Pressure (mmHg)IndexWaveform Comment +---------+------------------+-----+---------+-------+ Brachial 175                                     +---------+------------------+-----+---------+-------+ ATA      193                    triphasic1.10    +---------+------------------+-----+---------+-------+ PTA      191               1.09 triphasic        +---------+------------------+-----+---------+-------+ Great Toe160               0.91 Normal           +---------+------------------+-----+---------+-------+  Summary: Right: Resting right ankle-brachial index is within normal range. No evidence of significant right lower extremity arterial disease. The right toe-brachial index is normal. Left: Resting left ankle-brachial index is within normal range. No evidence of significant left lower extremity arterial disease. The left toe-brachial index is normal.  *See table(s) above for measurements and observations.  Electronically signed by Hortencia Pilar MD on 05/18/2021 at 5:07:51 PM.  Final        Assessment & Plan:   1. Decreased pulses in feet While the patient may have decreased pulses today his ABIs are normal with no abnormalities and waveforms.  Patient has no symptoms of worsening PAD.  The patient will follow-up with [place] on an as-needed basis.  2. Essential hypertension Continue antihypertensive medications as already ordered, these medications have been reviewed and there are no changes at this time.   3. Type 2 diabetes mellitus with diabetic nephropathy, without long-term current use of insulin (HCC) Continue hypoglycemic medications as already ordered, these medications have been  reviewed and there are no changes at this time.  Hgb A1C to be monitored as already arranged by primary service    Current Outpatient Medications on File Prior to Visit  Medication Sig Dispense Refill   amLODipine (NORVASC) 10 MG tablet Take 1 tablet (10 mg total) by mouth daily. 90 tablet 3   carvedilol (COREG) 12.5 MG tablet TAKE ONE (1) TABLET BY MOUTH TWO TIMES PER DAY 180 tablet 3   celecoxib (CELEBREX) 200 MG capsule TAKE 1 CAPSULE BY MOUTH EVERY DAY 30 capsule 11   Cholecalciferol (VITAMIN D-3 PO) Take 10,000 Units by mouth daily.      clobetasol cream (TEMOVATE) 2.26 % Apply 1 application topically 2 (two) times daily as needed (irritation).      clorazepate (TRANXENE) 7.5 MG tablet TAKE 1 TABLET (7.5 MG TOTAL) BY MOUTH 2 (TWO) TIMES DAILY AS NEEDED FOR ANXIETY. 60 tablet 0   diclofenac Sodium (VOLTAREN) 1 % GEL APPLY 2 GRAMS TOPICALLY FOUR TIMES DAILY 100 g 11   Elastic Bandages & Supports (MEDICAL COMPRESSION STOCKINGS) MISC 2 Units by Does not apply route daily. 2 each 0   EPINEPHrine 0.3 mg/0.3 mL IJ SOAJ injection Inject 0.3 mLs (0.3 mg total) into the muscle as needed for anaphylaxis. 1 each 5   Ferrous Sulfate (IRON PO) Take 1 tablet by mouth at bedtime.     folic acid (FOLVITE) 1 MG tablet Take 1 mg by mouth daily.     furosemide (LASIX) 40 MG tablet Take 1 tablet (40 mg total) by mouth 2 (two) times daily. 180 tablet 3   gabapentin (NEURONTIN) 300 MG capsule TAKE 1 CAPSULE BY MOUTH 3  TIMES DAILY 270 capsule 3   glipiZIDE (GLUCOTROL) 5 MG tablet TAKE ONE-HALF TABLET BY  MOUTH 2 TIMES DAILY BEFORE  A MEAL. 90 tablet 3   hydroxychloroquine (PLAQUENIL) 200 MG tablet Take 1 tablet by mouth 2 (two) times daily.     hydrOXYzine (ATARAX/VISTARIL) 25 MG tablet TAKE ONE TABLET EVERY EIGHT HOURS AS NEEDED 90 tablet 3   isosorbide mononitrate (IMDUR) 60 MG 24 hr tablet Take 1 tablet (60 mg total) by mouth daily before lunch. 90 tablet 3   liraglutide (VICTOZA) 18 MG/3ML SOPN Inject 1.8  mg into the skin daily. 9 mL 11   losartan (COZAAR) 100 MG tablet Take 1 tablet (100 mg total) by mouth daily. 90 tablet 3   MAGNESIUM PO Take by mouth.     metFORMIN (GLUCOPHAGE) 500 MG tablet TAKE 1 TABLET BY MOUTH TWICE DAILY 180 tablet 3   Multiple Vitamins-Minerals (MULTIVITAL) tablet Take 1 tablet by mouth daily.     ONETOUCH ULTRA test strip CHECK BLOOD SUGAR 3 TIMES DAILY AS DIRECTED 100 each 1   rosuvastatin (CRESTOR) 20 MG tablet TAKE 1 TABLET BY MOUTH  DAILY 90 tablet 3   timolol (TIMOPTIC) 0.5 % ophthalmic solution Apply 1  drop to eye 2 (two) times daily.     No current facility-administered medications on file prior to visit.    There are no Patient Instructions on file for this visit. No follow-ups on file.   Kris Hartmann, NP

## 2021-05-20 ENCOUNTER — Other Ambulatory Visit: Payer: Self-pay | Admitting: Internal Medicine

## 2021-05-21 NOTE — Telephone Encounter (Signed)
Name of Medication: Hydrocodone Name of Pharmacy: [place] or Written Date and Quantity: 04-20-21 #60 Last Office Visit and Type: 03-16-21 Next Office Visit and Type: 06-18-21 Last Controlled Substance Agreement Date: 06-13-20 Last UDS: 06-13-20

## 2021-05-24 ENCOUNTER — Encounter (INDEPENDENT_AMBULATORY_CARE_PROVIDER_SITE_OTHER): Payer: Self-pay | Admitting: Nurse Practitioner

## 2021-05-25 DIAGNOSIS — Z4802 Encounter for removal of sutures: Secondary | ICD-10-CM | POA: Diagnosis not present

## 2021-06-14 ENCOUNTER — Other Ambulatory Visit: Payer: Self-pay | Admitting: Internal Medicine

## 2021-06-15 ENCOUNTER — Other Ambulatory Visit: Payer: Self-pay | Admitting: Internal Medicine

## 2021-06-15 NOTE — Telephone Encounter (Signed)
Last filled 04-23-21 #60 Last OV 03-16-21 Next OV 06-18-21 [facility]

## 2021-06-18 ENCOUNTER — Other Ambulatory Visit: Payer: Self-pay

## 2021-06-18 ENCOUNTER — Ambulatory Visit (INDEPENDENT_AMBULATORY_CARE_PROVIDER_SITE_OTHER): Payer: Medicare Other | Admitting: Internal Medicine

## 2021-06-18 ENCOUNTER — Encounter: Payer: Self-pay | Admitting: Internal Medicine

## 2021-06-18 VITALS — BP 140/92 | HR 64 | Temp 97.0°F | Ht 67.0 in | Wt 242.5 lb

## 2021-06-18 DIAGNOSIS — G8929 Other chronic pain: Secondary | ICD-10-CM | POA: Diagnosis not present

## 2021-06-18 DIAGNOSIS — N184 Chronic kidney disease, stage 4 (severe): Secondary | ICD-10-CM | POA: Diagnosis not present

## 2021-06-18 DIAGNOSIS — C84A0 Cutaneous T-cell lymphoma, unspecified, unspecified site: Secondary | ICD-10-CM

## 2021-06-18 DIAGNOSIS — M545 Low back pain, unspecified: Secondary | ICD-10-CM

## 2021-06-18 DIAGNOSIS — E1121 Type 2 diabetes mellitus with diabetic nephropathy: Secondary | ICD-10-CM

## 2021-06-18 DIAGNOSIS — F1120 Opioid dependence, uncomplicated: Secondary | ICD-10-CM | POA: Diagnosis not present

## 2021-06-18 DIAGNOSIS — Z23 Encounter for immunization: Secondary | ICD-10-CM

## 2021-06-18 DIAGNOSIS — M0600 Rheumatoid arthritis without rheumatoid factor, unspecified site: Secondary | ICD-10-CM

## 2021-06-18 NOTE — Assessment & Plan Note (Signed)
Ongoing pain Does exercise, heat, norco

## 2021-06-18 NOTE — Assessment & Plan Note (Signed)
Back on MTX for lymphoma and this

## 2021-06-18 NOTE — Assessment & Plan Note (Signed)
Going back on the MTX now

## 2021-06-18 NOTE — Assessment & Plan Note (Signed)
Seems to have good control on the glipizide gapapentin tid for neuropathy

## 2021-06-18 NOTE — Assessment & Plan Note (Signed)
PDMP reviewed No concerns 

## 2021-06-18 NOTE — Progress Notes (Signed)
Subjective:    Patient ID: Daniel Kline, male    DOB: 09/17/1945, 75 y.o.   MRN: [id number]  HPI Here for follow up of chronic pain and other medical conditions This visit occurred during the SARS-CoV-2 public health emergency.  Safety protocols were in place, including screening questions prior to the visit, additional usage of staff PPE, and extensive cleaning of exam room while observing appropriate contact time as indicated for disinfecting solutions.   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 90 tablet 3   carvedilol (COREG) 12.5 MG tablet TAKE ONE (1) TABLET BY MOUTH TWO TIMES PER DAY 180 tablet 3   celecoxib (CELEBREX) 200 MG capsule TAKE 1 CAPSULE BY MOUTH EVERY DAY 30 capsule 11   Cholecalciferol (VITAMIN D-3 PO) Take 10,000 Units by mouth daily.      clobetasol cream (TEMOVATE) 7.51 % Apply 1 application topically 2 (two) times daily as needed (irritation).      clorazepate (TRANXENE) 7.5 MG tablet TAKE 1 TABLET (7.5 MG TOTAL) BY MOUTH 2 (TWO) TIMES DAILY AS NEEDED FOR ANXIETY. 60 tablet 0   diclofenac Sodium (VOLTAREN) 1 % GEL APPLY 2 GRAMS TOPICALLY FOUR TIMES DAILY 100 g 11   Elastic Bandages & Supports (MEDICAL COMPRESSION STOCKINGS) MISC 2 Units by Does not apply route daily. 2 each 0   EPINEPHrine 0.3 mg/0.3 mL IJ SOAJ injection Inject 0.3 mLs (0.3 mg total) into the muscle as needed for  anaphylaxis. 1 each 5   Ferrous Sulfate (IRON PO) Take 1 tablet by mouth at bedtime.     folic acid (FOLVITE) 1 MG tablet Take 1 mg by mouth daily.     furosemide (LASIX) 40 MG tablet Take 1 tablet (40 mg total) by mouth 2 (two) times daily. 180 tablet 3   gabapentin (NEURONTIN) 300 MG capsule TAKE 1 CAPSULE BY MOUTH 3  TIMES DAILY 270 capsule 3   glipiZIDE (GLUCOTROL) 5 MG tablet TAKE ONE-HALF TABLET BY  MOUTH 2 TIMES DAILY BEFORE  A MEAL. 90 tablet 3   HYDROcodone-acetaminophen (NORCO/VICODIN) 5-325 MG tablet TAKE 1 TABLET BY MOUTH EVERY SIX HOURS AS NEEDED FOR PAIN 60 tablet 0   hydrOXYzine (ATARAX/VISTARIL) 25 MG tablet TAKE ONE TABLET EVERY EIGHT HOURS AS NEEDED 90 tablet 3   isosorbide mononitrate (IMDUR) 60 MG 24 hr tablet Take 1 tablet (60 mg total) by mouth daily before lunch. 90 tablet 3   liraglutide (VICTOZA) 18 MG/3ML SOPN Inject 1.8 mg into the skin daily. 9 mL 11   losartan (COZAAR) 100 MG tablet Take 1 tablet (100 mg total) by mouth daily. 90 tablet 3   MAGNESIUM PO Take by mouth.     metFORMIN (GLUCOPHAGE) 500 MG tablet TAKE 1 TABLET BY MOUTH TWICE DAILY 180 tablet 3   methotrexate 2.5 MG tablet Take 6 tablets by mouth once a week.     Multiple Vitamins-Minerals (MULTIVITAL) tablet Take 1 tablet by mouth daily.  ONETOUCH ULTRA test strip CHECK BLOOD SUGAR 3 TIMES DAILY AS DIRECTED 100 each 1   rosuvastatin (CRESTOR) 20 MG tablet TAKE 1 TABLET BY MOUTH  DAILY 90 tablet 3   timolol (TIMOPTIC) 0.5 % ophthalmic solution Apply 1 drop to eye 2 (two) times daily.     No current facility-administered medications on file prior to visit.    Allergies  Allergen Reactions   Bee Venom Anaphylaxis   Oxycodone Other (See Comments)    Delusions Delusions   Delusions   Hydromorphone Other (See Comments)    hallucinating   Zolpidem Other (See Comments)    Past Medical History:  Diagnosis Date   Anemia    H/O   Anxiety    Arthritis    Chronic kidney disease    Complication  of anesthesia    CTCL (cutaneous T-cell lymphoma) (HCC)    Diabetes mellitus without complication ([place])    Dysplastic nevus 12/19/2017   Right distal lat. forearm near wrist. Severe atypia, close to peripheral margin.   Dysplastic nevus 06/21/2018   Upper back right paraspinal. Severe atypia, peripheral margin involved. Excised 07/11/2018, margins free.   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 forearm. SCCis, hypertrophic.    Past Surgical History:  Procedure Laterality Date   BACK SURGERY     LUMBAR   CARDIAC ELECTROPHYSIOLOGY STUDY AND ABLATION  2019   COLONOSCOPY WITH PROPOFOL N/A 04/04/2019   Procedure: COLONOSCOPY WITH PROPOFOL;  Surgeon: Toledo, Benay Pike, MD;  Location: [facility];  Service: Gastroenterology;  Laterality: N/A;   I & D EXTREMITY Right 02/01/2020   Procedure: IRRIGATION AND DEBRIDEMENT EXTREMITY with poly exchange;  Surgeon: Dereck Leep, MD;  Location: [facility];  Service: Orthopedics;  Laterality: Right;   INCISION AND DRAINAGE     BACK-MRSA INFECTION AFTER BACK SURGERY   KNEE ARTHROPLASTY Right 01/28/2020   Procedure: COMPUTER ASSISTED TOTAL KNEE ARTHROPLASTY;  Surgeon: Dereck Leep, MD;  Location: [facility];  Service: Orthopedics;  Laterality: Right;   MOUTH SURGERY     right elbow surgery     right knee surgery     right shoulder surgery     from polio damage   TONSILLECTOMY     TOTAL HIP ARTHROPLASTY Bilateral 04/2016     Family History  Adopted: Yes    Social History   Socioeconomic History   Marital status: Married    Spouse name: Not on file   Number of children: 0   Years of education: Not on file   Highest education level: Not on file  Occupational History   Occupation: Nature conservation officer    Comment: 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   Occupation: Cytogeneticist    Comment: Retired   Occupation: Warden/ranger    Comment: Retired  Tobacco Use   Smoking status: Former    Types:  Cigars    Quit date: 09/23/1990    Years since quitting: 30.7   Smokeless tobacco: Former    Quit date: 02/25/2006  Vaping Use   Vaping Use: Never used  Substance and Sexual Activity   Alcohol use: No    Alcohol/week: 0.0 standard drinks   Drug use: No   Sexual activity: Yes    Partners: Female  Other Topics Concern   Not on file  Social History Narrative   Has living will   Wife is health care POA---then brother or sister   Would accept resuscitation attempts but no prolonged ventilation or tube feeds   Social Determinants of Health   Financial Resource Strain: Not on file  Food Insecurity: Not on file  Transportation Needs: Not on file  Physical Activity: Not on file  Stress: Not on file  Social Connections: Not on file  Intimate Partner Violence: Not on file   Review of Systems Sleeps okay---in recliner now (a new one) Appetite is fine now Weight is stable     Objective:   Physical Exam Constitutional:      Appearance: Normal appearance.  Cardiovascular:     Rate and Rhythm: Normal rate and regular rhythm.     Pulses: Normal pulses.     Heart sounds: No murmur heard.   No gallop.  Pulmonary:     Effort: Pulmonary effort is normal.     Breath sounds: Normal breath sounds. No wheezing or rales.  Musculoskeletal:     Cervical back: Neck supple.     Comments: Calves tight without pitting  Lymphadenopathy:     Cervical: No cervical adenopathy.  Neurological:      Mental Status: He is alert.           Assessment & Plan:

## 2021-06-18 NOTE — Assessment & Plan Note (Signed)
Last 29--reaction from hydroxychloroquine Hopefully better off it again--I will recheck

## 2021-06-19 LAB — RENAL FUNCTION PANEL
Albumin: 4.4 g/dL (ref 3.5–5.2)
BUN: 28 mg/dL — ABNORMAL HIGH (ref 6–23)
CO2: 29 mEq/L (ref 19–32)
Calcium: 9.6 mg/dL (ref 8.4–10.5)
Chloride: 103 mEq/L (ref 96–112)
Creatinine, Ser: 2.01 mg/dL — ABNORMAL HIGH (ref 0.40–1.50)
GFR: 31.57 mL/min — ABNORMAL LOW (ref >60.00)
Glucose, Bld: 84 mg/dL (ref 70–99)
Phosphorus: 3.9 mg/dL (ref 2.3–4.6)
Potassium: 4.3 mEq/L (ref 3.5–5.1)
Sodium: 141 mEq/L (ref 135–145)

## 2021-06-19 LAB — CBC
HCT: 42.1 % (ref 39.0–52.0)
Hemoglobin: 13.7 g/dL (ref 13.0–17.0)
MCHC: 32.5 g/dL (ref 30.0–36.0)
MCV: 87.4 fl (ref 78.0–100.0)
Platelets: 171 10*3/uL (ref 150.0–400.0)
RBC: 4.82 Mil/uL (ref 4.22–5.81)
RDW: 14.3 % (ref 11.5–15.5)
WBC: 5.4 10*3/uL (ref 4.0–10.5)

## 2021-06-19 LAB — HEMOGLOBIN A1C: Hgb A1c MFr Bld: 6.7 % — ABNORMAL HIGH (ref 4.6–6.5)

## 2021-06-25 ENCOUNTER — Other Ambulatory Visit: Payer: Self-pay | Admitting: Internal Medicine

## 2021-06-25 DIAGNOSIS — C8400 Mycosis fungoides, unspecified site: Secondary | ICD-10-CM | POA: Diagnosis not present

## 2021-06-25 NOTE — Telephone Encounter (Signed)
Name of Medication: Hydrocodone Name of Pharmacy: [place] or Written Date and Quantity: 05-21-21 #60 Last Office Visit and Type: 06-18-21 Next Office Visit and Type: 09-21-21 Last Controlled Substance Agreement Date: 06-18-21 Last UDS: 06-18-21

## 2021-07-06 DIAGNOSIS — B351 Tinea unguium: Secondary | ICD-10-CM | POA: Diagnosis not present

## 2021-07-06 DIAGNOSIS — L851 Acquired keratosis [keratoderma] palmaris et plantaris: Secondary | ICD-10-CM | POA: Diagnosis not present

## 2021-07-06 DIAGNOSIS — E1142 Type 2 diabetes mellitus with diabetic polyneuropathy: Secondary | ICD-10-CM | POA: Diagnosis not present

## 2021-07-08 ENCOUNTER — Other Ambulatory Visit: Payer: Self-pay | Admitting: Internal Medicine

## 2021-07-24 ENCOUNTER — Other Ambulatory Visit: Payer: Self-pay | Admitting: Internal Medicine

## 2021-07-24 NOTE — Telephone Encounter (Signed)
Name of Medication: Hydrocodone Name of Pharmacy: [place] or Written Date and Quantity: 06-26-21 #60 Last Office Visit and Type: 06-18-21 Next Office Visit and Type: 09-21-21 Last Controlled Substance Agreement Date: 06-18-21 Last UDS: 06-18-21

## 2021-07-30 DIAGNOSIS — C8400 Mycosis fungoides, unspecified site: Secondary | ICD-10-CM | POA: Diagnosis not present

## 2021-08-21 ENCOUNTER — Other Ambulatory Visit: Payer: Self-pay | Admitting: Internal Medicine

## 2021-08-21 NOTE — Telephone Encounter (Signed)
Last filled 06-15-21 #60 Last OV 06-18-21 Next OV 09-21-21 [facility]

## 2021-08-22 ENCOUNTER — Other Ambulatory Visit: Payer: Self-pay | Admitting: Internal Medicine

## 2021-08-26 ENCOUNTER — Other Ambulatory Visit: Payer: Self-pay | Admitting: Internal Medicine

## 2021-08-26 NOTE — Telephone Encounter (Signed)
Name of Medication: Hydrocodone Name of Pharmacy: [place] or Written Date and Quantity: 07-24-21 #60 Last Office Visit and Type: 06-18-21 Next Office Visit and Type: 09-21-21 Last Controlled Substance Agreement Date: 06-18-21 Last UDS: 06-18-21

## 2021-08-31 DIAGNOSIS — M0609 Rheumatoid arthritis without rheumatoid factor, multiple sites: Secondary | ICD-10-CM | POA: Diagnosis not present

## 2021-08-31 DIAGNOSIS — R768 Other specified abnormal immunological findings in serum: Secondary | ICD-10-CM | POA: Diagnosis not present

## 2021-08-31 DIAGNOSIS — Z79899 Other long term (current) drug therapy: Secondary | ICD-10-CM | POA: Diagnosis not present

## 2021-08-31 DIAGNOSIS — N1832 Chronic kidney disease, stage 3b: Secondary | ICD-10-CM | POA: Diagnosis not present

## 2021-09-04 DIAGNOSIS — C8400 Mycosis fungoides, unspecified site: Secondary | ICD-10-CM | POA: Diagnosis not present

## 2021-09-07 ENCOUNTER — Other Ambulatory Visit: Payer: Self-pay | Admitting: Internal Medicine

## 2021-09-08 DIAGNOSIS — C84A0 Cutaneous T-cell lymphoma, unspecified, unspecified site: Secondary | ICD-10-CM | POA: Diagnosis not present

## 2021-09-08 DIAGNOSIS — C8400 Mycosis fungoides, unspecified site: Secondary | ICD-10-CM | POA: Diagnosis not present

## 2021-09-08 DIAGNOSIS — Z7960 Long term (current) use of unspecified immunomodulators and immunosuppressants: Secondary | ICD-10-CM | POA: Diagnosis not present

## 2021-09-08 DIAGNOSIS — Z79899 Other long term (current) drug therapy: Secondary | ICD-10-CM | POA: Diagnosis not present

## 2021-09-10 ENCOUNTER — Other Ambulatory Visit: Payer: Self-pay

## 2021-09-10 ENCOUNTER — Encounter: Payer: Self-pay | Admitting: Dermatology

## 2021-09-10 ENCOUNTER — Ambulatory Visit: Payer: Medicare Other | Admitting: Dermatology

## 2021-09-10 DIAGNOSIS — L821 Other seborrheic keratosis: Secondary | ICD-10-CM

## 2021-09-10 DIAGNOSIS — Z86018 Personal history of other benign neoplasm: Secondary | ICD-10-CM | POA: Diagnosis not present

## 2021-09-10 DIAGNOSIS — D1800 Hemangioma unspecified site: Secondary | ICD-10-CM

## 2021-09-10 DIAGNOSIS — B354 Tinea corporis: Secondary | ICD-10-CM

## 2021-09-10 DIAGNOSIS — B353 Tinea pedis: Secondary | ICD-10-CM

## 2021-09-10 DIAGNOSIS — L814 Other melanin hyperpigmentation: Secondary | ICD-10-CM | POA: Diagnosis not present

## 2021-09-10 DIAGNOSIS — D229 Melanocytic nevi, unspecified: Secondary | ICD-10-CM

## 2021-09-10 DIAGNOSIS — C84A0 Cutaneous T-cell lymphoma, unspecified, unspecified site: Secondary | ICD-10-CM

## 2021-09-10 DIAGNOSIS — Z85828 Personal history of other malignant neoplasm of skin: Secondary | ICD-10-CM | POA: Diagnosis not present

## 2021-09-10 DIAGNOSIS — Z1283 Encounter for screening for malignant neoplasm of skin: Secondary | ICD-10-CM

## 2021-09-10 DIAGNOSIS — L578 Other skin changes due to chronic exposure to nonionizing radiation: Secondary | ICD-10-CM

## 2021-09-10 DIAGNOSIS — I872 Venous insufficiency (chronic) (peripheral): Secondary | ICD-10-CM

## 2021-09-10 MED ORDER — KETOCONAZOLE 2 % EX CREA: TOPICAL_CREAM | CUTANEOUS | 2 refills | Status: DC

## 2021-09-10 NOTE — Progress Notes (Signed)
Follow-Up Visit   Subjective  Daniel Kline is a 76 y.o. male who presents for the following: Annual Exam (Here for skin cancer screening. Full body. HxDN, HxSCC) and Rash (CTCL  (Patient restarted MTX 2.5mg  6 tabs po QD on Thursday's, and folic acid on the off days. He also uses an at home light box once per week). Using Triamcinolone as directed by Dr. Irish Elders. Patient reports he is feeling well).  The patient presents for Total-Body Skin Exam (TBSE) for skin cancer screening and mole check.  The patient has spots, moles and lesions to be evaluated, some may be new or changing and the patient has concerns that these could be cancer.  The following portions of the chart were reviewed this encounter and updated as appropriate:  Tobacco   Allergies   Meds   Problems   Med Hx   Surg Hx   Fam Hx       Review of Systems: No other skin or systemic complaints except as noted in HPI or Assessment and Plan.   Objective  Well appearing patient in no apparent distress; mood and affect are within normal limits.  A full examination was performed including scalp, head, eyes, ears, nose, lips, neck, chest, axillae, abdomen, back, buttocks, bilateral upper extremities, bilateral lower extremities, hands, feet, fingers, toes, fingernails, and toenails. All findings within normal limits unless otherwise noted below.  Groin, back, post legs, buttock Scattered nummular erythematous thin plaques and thin papules; active at left upper back, left lower back, right upper back, buttocks, right post thigh  B/L lower legs Erythematous, scaly patches involving the ankle and distal lower leg with associated lower leg edema. Blister forming at anterior left lower leg. L>R  Left Axilla Scaling, erythema, maceration  B/L feet Annular scaly plaque left axilla   Assessment & Plan   Lentigines - Scattered tan macules - Due to sun exposure - Benign-appearing, observe - Recommend daily broad spectrum sunscreen  SPF 30+ to sun-exposed areas, reapply every 2 hours as needed. - Call for any changes  Seborrheic Keratoses - Stuck-on, waxy, tan-brown papules and/or plaques  - Benign-appearing - Discussed benign etiology and prognosis. - Observe - Call for any changes  Melanocytic Nevi - Tan-brown and/or pink-flesh-colored symmetric macules and papules - Benign appearing on exam today - Observation - Call clinic for new or changing moles - Recommend daily use of broad spectrum spf 30+ sunscreen to sun-exposed areas.   Hemangiomas - Red papules - Discussed benign nature - Observe - Call for any changes  Actinic Damage - Chronic condition, secondary to cumulative UV/sun exposure - diffuse scaly erythematous macules with underlying dyspigmentation - Recommend daily broad spectrum sunscreen SPF 30+ to sun-exposed areas, reapply every 2 hours as needed.  - Staying in the shade or wearing long sleeves, sun glasses (UVA+UVB protection) and wide brim hats (4-inch brim around the entire circumference of the hat) are also recommended for sun protection.  - Call for new or changing lesions.  History of Dysplastic Nevi - No evidence of recurrence today at right upper back and right forearm - Recommend regular full body skin exams - Recommend daily broad spectrum sunscreen SPF 30+ to sun-exposed areas, reapply every 2 hours as needed.  - Call if any new or changing lesions are noted between office visits   History of Squamous Cell Carcinoma of the Skin - No evidence of recurrence today at right mid lateral forearm - No lymphadenopathy - Recommend regular full body skin exams -  Recommend daily broad spectrum sunscreen SPF 30+ to sun-exposed areas, reapply every 2 hours as needed.  - Call if any new or changing lesions are noted between office visits     Skin cancer screening performed today.  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 Irish Elders. Continue following up with [facility]. Continue at home light box once weekly.  Currently flared but just saw Dr. Irish Elders recently.  Use Triamcinolone cream to affected areas on back, buttocks and thighs up to 2 weeks as needed. If not clearing resume Clobetasol cream twice daily up to 2 weeks as directed.   If not clearing, re-evaluate - consider increasing phototherapy, earlier follow-up with Dr. Irish Elders, ensure no tinea (though not suggestive of tinea on exam today).  Chronic and persistent condition with duration or expected duration over one year. Condition is bothersome/symptomatic for patient. Currently flared.     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 Condition is bothersome to patient. Currently flared.  Stasis in the legs causes chronic leg swelling, which may result in itchy or painful rashes, skin discoloration, skin texture changes, and sometimes ulceration.  Recommend daily graduated compression hose/stockings- easiest to put on first thing in morning, remove at bedtime.  Elevate legs as much as possible. Avoid salt/sodium rich foods.  Use Triamcinolone twice daily to legs up to 2 weeks as needed.   Tinea pedis of both feet Left Axilla  Start Ketoconazole cream Apply twice daily to under arm and feet until clear then one additional week  Tinea corporis B/L feet  Start ketoconazole twice a day until clear then continue for one more week  ketoconazole (NIZORAL) 2 % cream - B/L feet Apply twice daily to under arm and feet until clear then one additional week   Return for TBSE in 6-12 months.  I, Emelia Salisbury, CMA, am acting as scribe for Forest Gleason, MD.  Documentation: I have reviewed the above documentation for accuracy and completeness, and I agree with the above.  Forest Gleason, MD

## 2021-09-10 NOTE — Patient Instructions (Addendum)
Gentle Skin Care Guide  1. Bathe no more than once a day.  2. Avoid bathing in hot water  3. Use a mild soap like Dove, Vanicream, Cetaphil, CeraVe. Can use Lever 2000 or Cetaphil antibacterial soap  4. Use soap only where you need it. On most days, use it under your arms, between your legs, and on your feet. Let the water rinse other areas unless visibly dirty.  5. When you get out of the bath/shower, use a towel to gently blot your skin dry, don't rub it.  6. While your skin is still a little damp, apply a moisturizing cream such as Vanicream, CeraVe, Cetaphil, Eucerin, Sarna lotion or plain Vaseline Jelly. For hands apply Neutrogena [place] Hand Cream or Excipial Hand Cream.  7. Reapply moisturizer any time you start to itch or feel dry.  8. Sometimes using free and clear laundry detergents can be helpful. Fabric softener sheets should be avoided. Downy Free & Gentle liquid, or any liquid fabric softener that is free of dyes and perfumes, it acceptable to use  9. If your doctor has given you prescription creams you may apply moisturizers over them   Feet/Left underarms Apply twice daily to underarm area and feet until clear then one additional week  Use Triamcinolone twice daily to legs up to 2 weeks as needed. Continue compression socks as directed.  Use Triamcinolone cream to affected areas on back, buttocks and thighs up to 2 weeks as needed. Avoid applying to face, groin, and axilla. Use as directed. Long-term use can cause thinning of the skin. If not clearing with Triamcinolone resume Clobetasol cream twice daily up to 2 weeks as directed.   Topical steroids (such as triamcinolone, fluocinolone, fluocinonide, mometasone, clobetasol, halobetasol, betamethasone, hydrocortisone) can cause thinning and lightening of the skin if they are used for too long in the same area. Your physician has selected the right strength medicine for your problem and area affected on the body.  Please use your medication only as directed by your physician to prevent side effects.      Melanoma ABCDEs  Melanoma is the most dangerous type of skin cancer, and is the leading cause of death from skin disease.  You are more likely to develop melanoma if you: Have light-colored skin, light-colored eyes, or red or blond hair Spend a lot of time in the sun Tan regularly, either outdoors or in a tanning bed Have had blistering sunburns, especially during childhood Have a close family member who has had a melanoma Have atypical moles or large birthmarks  Early detection of melanoma is key since treatment is typically straightforward and cure rates are extremely high if we catch it early.   The first sign of melanoma is often a change in a mole or a new dark spot.  The ABCDE system is a way of remembering the signs of melanoma.  A for asymmetry:  The two halves do not match. B for border:  The edges of the growth are irregular. C for color:  A mixture of colors are present instead of an even brown color. D for diameter:  Melanomas are usually (but not always) greater than 18mm - the size of a pencil eraser. E for evolution:  The spot keeps changing in size, shape, and color.  Please check your skin once per month between visits. You can use a small mirror in front and a large mirror behind you to keep an eye on the back side or your body.  If you see any new or changing lesions before your next follow-up, please call to schedule a visit.  Please continue daily skin protection including broad spectrum sunscreen SPF 30+ to sun-exposed areas, reapplying every 2 hours as needed when you're outdoors.   Staying in the shade or wearing long sleeves, sun glasses (UVA+UVB protection) and wide brim hats (4-inch brim around the entire circumference of the hat) are also recommended for sun protection.    Recommend taking Heliocare sun protection supplement daily in sunny weather for additional sun  protection. For maximum protection on the sunniest days, you can take up to 2 capsules of regular Heliocare OR take 1 capsule of Heliocare Ultra. For prolonged exposure (such as a full day in the sun), you can repeat your dose of the supplement 4 hours after your first dose. Heliocare can be purchased at [employer], at some [facility] or at [email or web address].    Recommend daily broad spectrum sunscreen SPF 30+ to sun-exposed areas, reapply every 2 hours as needed. Call for new or changing lesions.  Staying in the shade or wearing long sleeves, sun glasses (UVA+UVB protection) and wide brim hats (4-inch brim around the entire circumference of the hat) are also recommended for sun protection.   If You Need Anything After Your Visit  If you have any questions or concerns for your doctor, please call our main line at [phone] and press option 4 to reach your doctor's medical assistant. If no one answers, please leave a voicemail as directed and we will return your call as soon as possible. Messages left after 4 pm will be answered the following business day.   You may also send [place] a message via [place]. We typically respond to MyChart messages within 1-2 business days.  For prescription refills, please ask your pharmacy to contact our office. Our fax number is [phone].  If you have an urgent issue when the clinic is closed that cannot wait until the next business day, you can page your doctor at the number below.    Please note that while we do our best to be available for urgent issues outside of office hours, we are not available 24/7.   If you have an urgent issue and are unable to reach [place], you may choose to seek medical care at your doctor's office, retail clinic, urgent care center, or emergency room.  If you have a medical emergency, please immediately call 911 or go to the emergency department.  Pager Numbers  - Dr. Nehemiah Massed: [phone]  - Dr. Laurence Ferrari: [phone]  -  Dr. Nicole Kindred: [phone]  In the event of inclement weather, please call our main line at [phone] for an update on the status of any delays or closures.  Dermatology Medication Tips: Please keep the boxes that topical medications come in in order to help keep track of the instructions about where and how to use these. Pharmacies typically print the medication instructions only on the boxes and not directly on the medication tubes.   If your medication is too expensive, please contact our office at [phone] option 4 or send [place] a message through [place].   We are unable to tell what your co-pay for medications will be in advance as this is different depending on your insurance coverage. However, we may be able to find a substitute medication at lower cost or fill out paperwork to get insurance to cover a needed medication.   If a prior authorization is required to get your medication  covered by your insurance company, please allow [place] 1-2 business days to complete this process.  Drug prices often vary depending on where the prescription is filled and some pharmacies may offer cheaper prices.  The website www.goodrx.com contains coupons for medications through different pharmacies. The prices here do not account for what the cost may be with help from insurance (it may be cheaper with your insurance), but the website can give you the price if you did not use any insurance.  - You can print the associated coupon and take it with your prescription to the pharmacy.  - You may also stop by our office during regular business hours and pick up a GoodRx coupon card.  - If you need your prescription sent electronically to a different pharmacy, notify our office through [facility] or by phone at [phone] option 4.     Si Usted Necesita Algo Despus de Su Visita  Tambin puede enviarnos un mensaje a travs de Pharmacist, community. Por lo general respondemos a los mensajes de MyChart en el  transcurso de 1 a 2 das hbiles.  Para renovar recetas, por favor pida a su farmacia que se ponga en contacto con nuestra oficina. Harland Dingwall de fax es [place] [phone].  Si tiene un asunto urgente cuando la clnica est cerrada y que no puede esperar hasta el siguiente da hbil, puede llamar/localizar a su doctor(a) al nmero que aparece a continuacin.   Por favor, tenga en cuenta que aunque hacemos todo lo posible para estar disponibles para asuntos urgentes fuera del horario de [place], no estamos disponibles las 24 horas del da, los 7 das de la [place].   Si tiene un problema urgente y no puede comunicarse con nosotros, puede optar por buscar atencin mdica  en el consultorio de su doctor(a), en una clnica privada, en un centro de atencin urgente o en una sala de emergencias.  Si tiene Engineering geologist, por favor llame inmediatamente al 911 o vaya a la sala de emergencias.  Nmeros de bper  - Dr. Nehemiah Massed: [phone]  - Dra. Moye: [phone]  - Dra. Nicole Kindred: [phone]  En caso de inclemencias del [name], por favor llame a Johnsie Kindred principal al [phone] para una actualizacin sobre el [place] de cualquier retraso o cierre.  Consejos para la medicacin en dermatologa: Por favor, guarde las cajas en las que vienen los medicamentos de uso tpico para ayudarle a seguir las instrucciones sobre dnde y cmo usarlos. Las farmacias generalmente imprimen las instrucciones del medicamento slo en las cajas y no directamente en los tubos del [place].   Si su medicamento es muy caro, por favor, pngase en contacto con Zigmund Daniel llamando al [phone] y presione la opcin 4 o envenos un mensaje a travs de Pharmacist, community.   No podemos decirle cul ser su copago por los medicamentos por adelantado ya que esto es diferente dependiendo de la cobertura de su seguro. Sin embargo, es posible que podamos encontrar un medicamento sustituto a Electrical engineer un  formulario para que el seguro cubra el medicamento que se considera necesario.   Si se requiere una autorizacin previa para que su compaa de seguros [place] su medicamento, por favor permtanos de 1 a 2 das hbiles para completar este proceso.  Los precios de los medicamentos varan con frecuencia dependiendo del Environmental consultant de dnde se surte la receta y alguna farmacias pueden ofrecer precios ms baratos.  El sitio web www.goodrx.com tiene cupones para medicamentos de Airline pilot. Los precios aqu  no tienen en cuenta lo que podra costar con la ayuda del seguro (puede ser ms barato con su seguro), pero el sitio web puede darle el precio si no Field seismologist.  - Puede imprimir el cupn correspondiente y llevarlo con su receta a la farmacia.  - Tambin puede pasar por nuestra oficina durante el horario de atencin regular y Charity fundraiser una tarjeta de cupones de GoodRx.  - Si necesita que su receta se enve electrnicamente a una farmacia diferente, informe a nuestra oficina a travs de MyChart de [place] o por telfono llamando al [phone] y presione la opcin 4.

## 2021-09-16 ENCOUNTER — Other Ambulatory Visit: Payer: Self-pay | Admitting: Internal Medicine

## 2021-09-17 ENCOUNTER — Encounter: Payer: Self-pay | Admitting: Dermatology

## 2021-09-17 ENCOUNTER — Telehealth: Payer: Self-pay | Admitting: Dermatology

## 2021-09-17 NOTE — Telephone Encounter (Signed)
Please call patient and advise him if the scaly spots on his back and buttock are not clearing with the steroid, to call us and we'll bring him back to do a scraping to just be sure there is no fungus there. Thank you!

## 2021-09-21 ENCOUNTER — Encounter: Payer: Self-pay | Admitting: Internal Medicine

## 2021-09-21 ENCOUNTER — Other Ambulatory Visit: Payer: Self-pay

## 2021-09-21 ENCOUNTER — Ambulatory Visit (INDEPENDENT_AMBULATORY_CARE_PROVIDER_SITE_OTHER): Payer: Medicare Other | Admitting: Internal Medicine

## 2021-09-21 VITALS — BP 128/80 | HR 42 | Temp 98.3°F | Ht 65.0 in | Wt 248.0 lb

## 2021-09-21 DIAGNOSIS — I10 Essential (primary) hypertension: Secondary | ICD-10-CM | POA: Diagnosis not present

## 2021-09-21 DIAGNOSIS — I4892 Unspecified atrial flutter: Secondary | ICD-10-CM | POA: Diagnosis not present

## 2021-09-21 DIAGNOSIS — E1121 Type 2 diabetes mellitus with diabetic nephropathy: Secondary | ICD-10-CM

## 2021-09-21 DIAGNOSIS — N1832 Chronic kidney disease, stage 3b: Secondary | ICD-10-CM

## 2021-09-21 DIAGNOSIS — M0600 Rheumatoid arthritis without rheumatoid factor, unspecified site: Secondary | ICD-10-CM

## 2021-09-21 DIAGNOSIS — Z0000 Encounter for general adult medical examination without abnormal findings: Secondary | ICD-10-CM

## 2021-09-21 DIAGNOSIS — F1120 Opioid dependence, uncomplicated: Secondary | ICD-10-CM

## 2021-09-21 LAB — HM DIABETES FOOT EXAM

## 2021-09-21 MED ORDER — AMLODIPINE BESYLATE 5 MG PO TABS: 5.0000 mg | ORAL_TABLET | Freq: Every day | ORAL | 3 refills | Status: DC

## 2021-09-21 MED ORDER — HYDROCODONE-ACETAMINOPHEN 5-325 MG PO TABS: ORAL_TABLET | ORAL | 0 refills | Status: DC

## 2021-09-21 NOTE — Assessment & Plan Note (Signed)
Gained weight despite the victoza Some mild GI symptoms--so will not increase Discussed better lifestyle

## 2021-09-21 NOTE — Assessment & Plan Note (Signed)
PDMP reviewed No concerns 

## 2021-09-21 NOTE — Assessment & Plan Note (Signed)
I have personally reviewed the Medicare Annual Wellness questionnaire and have noted 1. The patient's medical and social history 2. Their use of alcohol, tobacco or illicit drugs 3. Their current medications and supplements 4. The patient's functional ability including ADL's, fall risks, home safety risks and hearing or visual             impairment. 5. Diet and physical activities 6. Evidence for depression or mood disorders  The patients weight, height, BMI and visual acuity have been recorded in the chart I have made referrals, counseling and provided education to the patient based review of the above and I have provided the pt with a written personalized care plan for preventive services.  I have provided you with a copy of your personalized plan for preventive services. Please take the time to review along with your updated medication list.  Recommended COVID bivalent booster Flu vaccine in the fall Getting back to regular exercise No PSA due to age Consider last colon in 2025 (had polyps 2020) Needs Td at pharmacy

## 2021-09-21 NOTE — Assessment & Plan Note (Signed)
BP Readings from Last 3 Encounters:  09/21/21 128/80  06/18/21 (!) 140/92  05/18/21 (!) 155/84   Okay on losartan 100 daily and amlodipine 10mg  daily Will try decreasing amlodipine due to edema

## 2021-09-21 NOTE — Assessment & Plan Note (Signed)
Lab Results  Component Value Date   HGBA1C 6.7 (H) 06/18/2021   Fair control on victoza 1.8mg  daily, metformin 500 bid and glipizide 2.5 bid

## 2021-09-21 NOTE — Progress Notes (Signed)
Hearing Screening  Method: Audiometry   500Hz  1000Hz  2000Hz  4000Hz   Right ear 40 40 40 0  Left ear 40 40 40 0  Comments: Wax in ears  Vision Screening   Right eye Left eye Both eyes  Without correction 0 20/100 20/70  With correction

## 2021-09-21 NOTE — Assessment & Plan Note (Signed)
No symptoms of recurrence 

## 2021-09-21 NOTE — Assessment & Plan Note (Signed)
Fairly quiet on MTX 15mg  daily Folic acid also

## 2021-09-21 NOTE — Patient Instructions (Signed)
Please decrease the amlodipine to 5mg  daily. That should help the swelling in your legs. You can get a tetanus booster at the pharmacy

## 2021-09-21 NOTE — Assessment & Plan Note (Signed)
Stable GFR on losartan 100

## 2021-09-21 NOTE — Progress Notes (Signed)
Subjective:    Patient ID: Daniel Kline, male    DOB: 1945/11/14, 76 y.o.   MRN: [id number]  HPI Here for Medicare wellness visit and follow up of chronic health conditions Reviewed advanced directives Reviewed other doctors----Dr Patel---rheumatology, Dr Gollan--cardiology, Dr Claris Gladden (and Dr Laurence Ferrari), Dr Oley Balm, Dr Gustavus Bryant, Dr Leslye Peer, Dr Archie Balboa No hospitalizations or surgery in past year Vision is okay--overdue for eye exam Hearing is okay---wife not so sure No tobacco or alcohol He has not been exercising much lately (has home gym)---plans to restart soon Has fallen a few times---getting out of recliner (where he sleeps). No sig injury Will have brief down times---never a full day. Not anhedonic Independent with instrumental ADLs No sig memory issues  Rheumatoid arthritis seems to be controlled with the methotrexate Still some left shoulder pain (discussed that this might be OA----voltaren does help) On celebrex also  Checking sugars 2-3 times a week 90-160 generally No hypoglycemic reactions Hasn't lost weight on victoza 1.8mg --BMI went up to 41 Last GFR 39  Chronic back pain---tries to stretch and use heating pad at night Has the norco ---- uses once or twice a day Gabapentin also  No chest pain No palpitations No SOB No dizziness or syncope No edema  Known mycosis fungoides Takes topical Rx--clobetasol  Current Outpatient Medications on File Prior to Visit  Medication Sig Dispense Refill   amLODipine (NORVASC) 10 MG tablet Take 1 tablet (10 mg total) by mouth daily. 90 tablet 3   carvedilol (COREG) 12.5 MG tablet TAKE ONE (1) TABLET BY MOUTH TWO TIMES PER DAY 180 tablet 3   celecoxib (CELEBREX) 200 MG capsule TAKE 1 CAPSULE BY MOUTH EVERY DAY 30 capsule 11   Cholecalciferol (VITAMIN D-3 PO) Take 10,000 Units by mouth daily.      clobetasol cream (TEMOVATE) 4.25 % Apply 1 application topically 2 (two) times daily as  needed (irritation).      clorazepate (TRANXENE) 7.5 MG tablet TAKE 1 TABLET (7.5 MG TOTAL) BY MOUTH 2 (TWO) TIMES DAILY AS NEEDED FOR ANXIETY. 60 tablet 0   diclofenac Sodium (VOLTAREN) 1 % GEL APPLY 2 GRAMS TOPICALLY FOUR TIMES DAILY 100 g 11   Elastic Bandages & Supports (MEDICAL COMPRESSION STOCKINGS) MISC 2 Units by Does not apply route daily. 2 each 0   EPINEPHrine 0.3 mg/0.3 mL IJ SOAJ injection Inject 0.3 mLs (0.3 mg total) into the muscle as needed for anaphylaxis. 1 each 5   Ferrous Sulfate (IRON PO) Take 1 tablet by mouth at bedtime.     folic acid (FOLVITE) 1 MG tablet Take 1 mg by mouth daily.     furosemide (LASIX) 40 MG tablet Take 1 tablet (40 mg total) by mouth 2 (two) times daily. 180 tablet 3   gabapentin (NEURONTIN) 300 MG capsule TAKE 1 CAPSULE BY MOUTH 3  TIMES DAILY 270 capsule 3   glipiZIDE (GLUCOTROL) 5 MG tablet TAKE ONE-HALF TABLET BY  MOUTH 2 TIMES DAILY BEFORE  A MEAL. 90 tablet 3   HYDROcodone-acetaminophen (NORCO/VICODIN) 5-325 MG tablet TAKE 1 TABLET BY MOUTH EVERY SIX HOURS AS NEEDED FOR PAIN 60 tablet 0   hydrOXYzine (ATARAX) 25 MG tablet TAKE ONE TABLET EVERY EIGHT HOURS AS NEEDED 90 tablet 3   isosorbide mononitrate (IMDUR) 60 MG 24 hr tablet Take 1 tablet (60 mg total) by mouth daily before lunch. 90 tablet 3   ketoconazole (NIZORAL) 2 % cream Apply twice daily to under arm and feet until clear then one additional week 60  g 2   losartan (COZAAR) 100 MG tablet Take 1 tablet (100 mg total) by mouth daily. 90 tablet 3   MAGNESIUM PO Take by mouth.     metFORMIN (GLUCOPHAGE) 500 MG tablet TAKE 1 TABLET BY MOUTH TWICE DAILY 180 tablet 3   methotrexate 2.5 MG tablet Take 6 tablets by mouth once a week.     Multiple Vitamins-Minerals (MULTIVITAL) tablet Take 1 tablet by mouth daily.     ONETOUCH ULTRA test strip CHECK BLOOD SUGAR 3 TIMES DAILY AS DIRECTED 100 each 1   rosuvastatin (CRESTOR) 20 MG tablet TAKE 1 TABLET BY MOUTH  DAILY 90 tablet 3   timolol (TIMOPTIC)  0.5 % ophthalmic solution Apply 1 drop to eye 2 (two) times daily.     VICTOZA 18 MG/3ML SOPN INJECT 1.8MG  UNDER THE SKIN DAILY AS DIRECTED 9 mL 11   No current facility-administered medications on file prior to visit.    Allergies  Allergen Reactions   Bee Venom Anaphylaxis   Oxycodone Other (See Comments)    Delusions Delusions   Delusions   Hydromorphone Other (See Comments)    hallucinating   Zolpidem Other (See Comments)    Past Medical History:  Diagnosis Date   Anemia    H/O   Anxiety    Arthritis    Chronic kidney disease    Complication of anesthesia    CTCL (cutaneous T-cell lymphoma) (HCC)    Diabetes mellitus without complication ([place])    Dysplastic nevus 12/19/2017   Right distal lat. forearm near wrist. Severe atypia, close to peripheral margin.   Dysplastic nevus 06/21/2018   Upper back right paraspinal. Severe atypia, peripheral margin involved. Excised 07/11/2018, margins free.   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 forearm. SCCis, hypertrophic.    Past Surgical History:  Procedure Laterality Date   BACK SURGERY     LUMBAR   CARDIAC ELECTROPHYSIOLOGY STUDY AND ABLATION  2019   COLONOSCOPY WITH PROPOFOL N/A 04/04/2019   Procedure: COLONOSCOPY WITH PROPOFOL;  Surgeon: Toledo, Benay Pike, MD;  Location: [facility] ENDOSCOPY;  Service: Gastroenterology;  Laterality: N/A;   I & D EXTREMITY Right 02/01/2020   Procedure: IRRIGATION AND  DEBRIDEMENT EXTREMITY with poly exchange;  Surgeon: Dereck Leep, MD;  Location: [facility] ORS;  Service: Orthopedics;  Laterality: Right;   INCISION AND DRAINAGE     BACK-MRSA INFECTION AFTER BACK SURGERY   KNEE ARTHROPLASTY Right 01/28/2020   Procedure: COMPUTER ASSISTED TOTAL KNEE ARTHROPLASTY;  Surgeon: Dereck Leep, MD;  Location: [facility] ORS;  Service: Orthopedics;  Laterality: Right;   MOUTH SURGERY     right elbow surgery     right knee surgery     right shoulder surgery     from polio damage   TONSILLECTOMY     TOTAL HIP ARTHROPLASTY Bilateral 04/2016    Family  History  Adopted: Yes    Social History   Socioeconomic History   Marital status: Married    Spouse name: Not on file   Number of children: 0   Years of education: Not on file   Highest education level: Not on file  Occupational History   Occupation: Nature conservation officer    Comment: 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   Occupation: Cytogeneticist    Comment: Retired   Occupation: Warden/ranger    Comment: Retired  Tobacco Use   Smoking status: Former    Types: Cigars    Quit date: 09/23/1990    Years since quitting: 31.0   Smokeless tobacco: Former    Quit date: 02/25/2006  Vaping Use   Vaping Use: Never used  Substance and Sexual Activity   Alcohol use: No    Alcohol/week: 0.0 standard drinks   Drug use: No   Sexual activity: Yes    Partners: Female  Other Topics Concern   Not on file  Social History Narrative   Has living will   Wife is health care POA---then brother or sister   Would accept resuscitation attempts but no prolonged ventilation or tube feeds   Social Determinants of Health   Financial Resource Strain: Not on file  Food Insecurity: Not on file  Transportation Needs: Not on file  Physical Activity: Not on file  Stress: Not on file  Social Connections: Not on file  Intimate Partner Violence: Not on file   Review of Systems Appetite is fair Weight is up a few  pounds Sleeps fairly well Wears seat belt Has dentures---no problems Rare heartburn--rolaids prn. No dysphagia Bowels move fine---no blood     Objective:   Physical Exam Constitutional:      Appearance: Normal appearance.  HENT:     Mouth/Throat:     Comments: No lesions Eyes:     Pupils: Pupils are equal, round, and reactive to light.     Comments: Slight alternating exotropia  Cardiovascular:     Rate and Rhythm: Normal rate and regular rhythm.     Pulses: Normal pulses.     Heart sounds: No murmur heard.   No gallop.  Pulmonary:     Effort: Pulmonary effort is normal.     Breath sounds: Normal breath sounds. No wheezing or rales.  Abdominal:     Palpations: Abdomen is soft.     Tenderness: There is no abdominal tenderness.  Musculoskeletal:     Cervical back: Neck supple.     Comments: Calves are full ---but without pitting  Lymphadenopathy:     Cervical: No cervical adenopathy.  Skin:    Findings: No lesion or rash.     Comments: No foot lesions  Neurological:     General: No focal deficit present.     Mental Status: He is alert and oriented to person, place, and time.     Comments: Sensation intact in feet  Mini-Cog normal  Psychiatric:        Mood and Affect: Mood normal.        Behavior: Behavior normal.           Assessment & Plan:

## 2021-10-06 ENCOUNTER — Ambulatory Visit: Payer: Medicare Other | Admitting: Dermatology

## 2021-10-06 ENCOUNTER — Other Ambulatory Visit: Payer: Self-pay

## 2021-10-06 ENCOUNTER — Telehealth: Payer: Self-pay

## 2021-10-06 DIAGNOSIS — L03112 Cellulitis of left axilla: Secondary | ICD-10-CM

## 2021-10-06 DIAGNOSIS — L304 Erythema intertrigo: Secondary | ICD-10-CM

## 2021-10-06 DIAGNOSIS — C8400 Mycosis fungoides, unspecified site: Secondary | ICD-10-CM | POA: Diagnosis not present

## 2021-10-06 DIAGNOSIS — C84A0 Cutaneous T-cell lymphoma, unspecified, unspecified site: Secondary | ICD-10-CM

## 2021-10-06 DIAGNOSIS — L03116 Cellulitis of left lower limb: Secondary | ICD-10-CM | POA: Diagnosis not present

## 2021-10-06 MED ORDER — TRIAMCINOLONE ACETONIDE 0.1 % EX CREA: TOPICAL_CREAM | CUTANEOUS | 0 refills | Status: DC

## 2021-10-06 MED ORDER — FLUCONAZOLE 200 MG PO TABS: ORAL_TABLET | ORAL | 0 refills | Status: DC

## 2021-10-06 MED ORDER — CIPROFLOXACIN HCL 250 MG PO TABS: 250.0000 mg | ORAL_TABLET | Freq: Two times a day (BID) | ORAL | 0 refills | Status: DC

## 2021-10-06 NOTE — Telephone Encounter (Signed)
Advised patient that Cipro may lower his blood glucose levels since it does have a contraindication to Glipizide. Patient states that he checks his blood glucose daily and we discussed that if he notices his blood glucose dropping Cipro is likely the cause. Advised patient that if he starts experiencing lightheadedness, dizziness, etc he should stop taking the Cipro and contact our office for further instruction.

## 2021-10-06 NOTE — Patient Instructions (Addendum)
Continue Ketoconazole 2% cream to area under the left arm once daily.  Start Triamcinolone 0.1% cream to area under the left arm and left lower leg once daily.  Start Cipro 250 mg take one pill twice daily for one week.  Start Diflucan 200 mg take one pill twice per week for three.    If You Need Anything After Your Visit  If you have any questions or concerns for your doctor, please call our main line at [phone] and press option 4 to reach your doctor's medical assistant. If no one answers, please leave a voicemail as directed and we will return your call as soon as possible. Messages left after 4 pm will be answered the following business day.   You may also send [place] a message via [place]. We typically respond to MyChart messages within 1-2 business days.  For prescription refills, please ask your pharmacy to contact our office. Our fax number is [phone].  If you have an urgent issue when the clinic is closed that cannot wait until the next business day, you can page your doctor at the number below.    Please note that while we do our best to be available for urgent issues outside of office hours, we are not available 24/7.   If you have an urgent issue and are unable to reach [place], you may choose to seek medical care at your doctor's office, retail clinic, urgent care center, or emergency room.  If you have a medical emergency, please immediately call 911 or go to the emergency department.  Pager Numbers  - Dr. Nehemiah Massed: [phone]  - Dr. Laurence Ferrari: [phone]  - Dr. Nicole Kindred: [phone]  In the event of inclement weather, please call our main line at [phone] for an update on the status of any delays or closures.  Dermatology Medication Tips: Please keep the boxes that topical medications come in in order to help keep track of the instructions about where and how to use these. Pharmacies typically print the medication instructions only on the boxes and not directly on  the medication tubes.   If your medication is too expensive, please contact our office at [phone] option 4 or send [place] a message through [place].   We are unable to tell what your co-pay for medications will be in advance as this is different depending on your insurance coverage. However, we may be able to find a substitute medication at lower cost or fill out paperwork to get insurance to cover a needed medication.   If a prior authorization is required to get your medication covered by your insurance company, please allow [place] 1-2 business days to complete this process.  Drug prices often vary depending on where the prescription is filled and some pharmacies may offer cheaper prices.  The website www.goodrx.com contains coupons for medications through different pharmacies. The prices here do not account for what the cost may be with help from insurance (it may be cheaper with your insurance), but the website can give you the price if you did not use any insurance.  - You can print the associated coupon and take it with your prescription to the pharmacy.  - You may also stop by our office during regular business hours and pick up a GoodRx coupon card.  - If you need your prescription sent electronically to a different pharmacy, notify our office through [facility] or by phone at [phone] option 4.     Si Usted Necesita Algo Despus de  Su Visita  Tambin puede enviarnos un mensaje a travs de MyChart. Por lo general respondemos a los mensajes de MyChart en el transcurso de 1 a 2 das hbiles.  Para renovar recetas, por favor pida a su farmacia que se ponga en contacto con nuestra oficina. Harland Dingwall de fax es [place] [phone].  Si tiene un asunto urgente cuando la clnica est cerrada y que no puede esperar hasta el siguiente da hbil, puede llamar/localizar a su doctor(a) al nmero que aparece a continuacin.   Por favor, tenga en cuenta que aunque hacemos todo lo  posible para estar disponibles para asuntos urgentes fuera del horario de [place], no estamos disponibles las 24 horas del da, los 7 das de la [place].   Si tiene un problema urgente y no puede comunicarse con nosotros, puede optar por buscar atencin mdica  en el consultorio de su doctor(a), en una clnica privada, en un centro de atencin urgente o en una sala de emergencias.  Si tiene Engineering geologist, por favor llame inmediatamente al 911 o vaya a la sala de emergencias.  Nmeros de bper  - Dr. Nehemiah Massed: [phone]  - Dra. Moye: [phone]  - Dra. Nicole Kindred: [phone]  En caso de inclemencias del [place], por favor llame a Johnsie Kindred principal al [phone] para una actualizacin sobre el [place] de cualquier retraso o cierre.  Consejos para la medicacin en dermatologa: Por favor, guarde las cajas en las que vienen los medicamentos de uso tpico para ayudarle a seguir las instrucciones sobre dnde y cmo usarlos. Las farmacias generalmente imprimen las instrucciones del medicamento slo en las cajas y no directamente en los tubos del [place].   Si su medicamento es muy caro, por favor, pngase en contacto con Zigmund Daniel llamando al [phone] y presione la opcin 4 o envenos un mensaje a travs de Pharmacist, community.   No podemos decirle cul ser su copago por los medicamentos por adelantado ya que esto es diferente dependiendo de la cobertura de su seguro. Sin embargo, es posible que podamos encontrar un medicamento sustituto a Electrical engineer un formulario para que el seguro cubra el medicamento que se considera necesario.   Si se requiere una autorizacin previa para que su compaa de seguros [place] su medicamento, por favor permtanos de 1 a 2 das hbiles para completar este proceso.  Los precios de los medicamentos varan con frecuencia dependiendo del Environmental consultant de dnde se surte la receta y alguna farmacias pueden ofrecer precios ms baratos.  El sitio web  www.goodrx.com tiene cupones para medicamentos de Airline pilot. Los precios aqu no tienen en cuenta lo que podra costar con la ayuda del seguro (puede ser ms barato con su seguro), pero el sitio web puede darle el precio si no utiliz Research scientist (physical sciences).  - Puede imprimir el cupn correspondiente y llevarlo con su receta a la farmacia.  - Tambin puede pasar por nuestra oficina durante el horario de atencin regular y Charity fundraiser una tarjeta de cupones de GoodRx.  - Si necesita que su receta se enve electrnicamente a una farmacia diferente, informe a nuestra oficina a travs de MyChart de [place] o por telfono llamando al [phone] y presione la opcin 4.

## 2021-10-06 NOTE — Progress Notes (Signed)
° °  Follow-Up Visit   Subjective  Daniel Kline is a 76 y.o. male who presents for the following: Rash (Of the L axilla x 1 week - painful, inflamed, oozing. He also has a rash a on the L lower leg that has been there for a few weeks. It started off itching and he scratched it now it's crusted and inflamed. He has been using Ketoconazole 2% cream to aa's on the L lower leg, and TMC and Clobetasol to the on the L axilla. ).  The following portions of the chart were reviewed this encounter and updated as appropriate:   Tobacco   Allergies   Meds   Problems   Med Hx   Surg Hx   Fam Hx      Review of Systems:  No other skin or systemic complaints except as noted in HPI or Assessment and Plan.  Objective  Well appearing patient in no apparent distress; mood and affect are within normal limits.  A focused examination was performed including the axilla, back, and L lower leg. Relevant physical exam findings are noted in the Assessment and Plan.  Left Axilla Erythema green yellow exudate of the L axilla      L lat calf L lat calf within tattoo site - green yellow crusting.       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 Condition is symptomatic / bothersome to patient. Not to goal.  Continue TMC and Clobetasol to aa's QD-BID PRN.   Erythema intertrigo Left Axilla With Cellulitis -  Start Cipro 250 mg BID x 1 week. #14 0RF.  Start Diflucan 200mg  1 po twice per week for 3 weeks. #60RF.   Continue Ketoconazole 2% cream QD and TMC 0.1% cream QD until f/u appt.   If rash doesn't clear with treatment recommend biopsy to r/o active CTCL.   fluconazole (DIFLUCAN) 200 MG tablet - Left Axilla Take one tab po twice per week for 3 weeks.  Cellulitis of left lower extremity L lat calf Start Cipro 250 mg po BID x 1 week. #14 0RF and  TMC 0.1% cream to aa QD PRN.  To calm inflammation and itch.  triamcinolone cream (KENALOG) 0.1 % - L lat calf Apply to aa L axilla and L lower leg QD PRN. ciprofloxacin (CIPRO) 250 MG tablet - L lat calf Take 1 tablet (250 mg total) by mouth 2 (two) times daily.  Return in about 3 weeks (around 10/27/2021).  Luther Redo, CMA, am acting as scribe for Sarina Ser, MD . Documentation: I have reviewed the above documentation for accuracy and completeness, and I agree with the above.  Sarina Ser, MD

## 2021-10-08 ENCOUNTER — Encounter: Payer: Self-pay | Admitting: Dermatology

## 2021-10-09 DIAGNOSIS — E1142 Type 2 diabetes mellitus with diabetic polyneuropathy: Secondary | ICD-10-CM | POA: Diagnosis not present

## 2021-10-09 DIAGNOSIS — B351 Tinea unguium: Secondary | ICD-10-CM | POA: Diagnosis not present

## 2021-10-13 DIAGNOSIS — N2581 Secondary hyperparathyroidism of renal origin: Secondary | ICD-10-CM | POA: Diagnosis not present

## 2021-10-13 DIAGNOSIS — I10 Essential (primary) hypertension: Secondary | ICD-10-CM | POA: Diagnosis not present

## 2021-10-13 DIAGNOSIS — E1122 Type 2 diabetes mellitus with diabetic chronic kidney disease: Secondary | ICD-10-CM | POA: Diagnosis not present

## 2021-10-13 DIAGNOSIS — N1832 Chronic kidney disease, stage 3b: Secondary | ICD-10-CM | POA: Diagnosis not present

## 2021-10-14 ENCOUNTER — Other Ambulatory Visit: Payer: Self-pay | Admitting: Dermatology

## 2021-10-14 DIAGNOSIS — L03116 Cellulitis of left lower limb: Secondary | ICD-10-CM

## 2021-10-14 DIAGNOSIS — L304 Erythema intertrigo: Secondary | ICD-10-CM

## 2021-10-16 ENCOUNTER — Telehealth: Payer: Self-pay

## 2021-10-16 NOTE — Telephone Encounter (Signed)
Pt called to let you know of recent labs drawn at [name]. They are available in care everywhere.  ?

## 2021-10-18 NOTE — Telephone Encounter (Signed)
Reviewed and they look okay ?No action needed ?

## 2021-10-23 ENCOUNTER — Other Ambulatory Visit: Payer: Self-pay | Admitting: Internal Medicine

## 2021-10-23 NOTE — Telephone Encounter (Signed)
Name of Medication: Hydrocodone ?Name of Pharmacy: [facility] ?Last Fill or Written Date and Quantity: 09-25-21 #60 ?Last Office Visit and Type: 09-21-21 ?Next Office Visit and Type: 12-24-21 ?Last Controlled Substance Agreement Date: 06-18-21 ?Last UDS: 06-18-21 ?

## 2021-10-29 ENCOUNTER — Other Ambulatory Visit: Payer: Self-pay

## 2021-10-29 ENCOUNTER — Ambulatory Visit: Payer: Medicare Other | Admitting: Dermatology

## 2021-10-29 DIAGNOSIS — L304 Erythema intertrigo: Secondary | ICD-10-CM

## 2021-10-29 DIAGNOSIS — C84A0 Cutaneous T-cell lymphoma, unspecified, unspecified site: Secondary | ICD-10-CM | POA: Diagnosis not present

## 2021-10-29 DIAGNOSIS — L03116 Cellulitis of left lower limb: Secondary | ICD-10-CM | POA: Diagnosis not present

## 2021-10-29 MED ORDER — MUPIROCIN 2 % EX OINT: TOPICAL_OINTMENT | CUTANEOUS | 0 refills | Status: DC

## 2021-10-29 NOTE — Patient Instructions (Addendum)
Start Mupirocin 2% ointment to crusted areas on the left lower leg and left under arm once daily until healed.  ?Continue Ketoconazole 2% cream to the left under arm once daily.  ?Continue Triamcinolone 0.1% cream to left lower leg once daily as needed for itch.  ? ? ? ?If You Need Anything After Your Visit ? ?If you have any questions or concerns for your doctor, please call our main line at [phone] and press option 4 to reach your doctor's medical assistant. If no one answers, please leave a voicemail as directed and we will return your call as soon as possible. Messages left after 4 pm will be answered the following business day.  ? ?You may also send [place] a message via MyChart. We typically respond to MyChart messages within 1-2 business days. ? ?For prescription refills, please ask your pharmacy to contact our office. Our fax number is [phone]. ? ?If you have an urgent issue when the clinic is closed that cannot wait until the next business day, you can page your doctor at the number below.   ? ?Please note that while we do our best to be available for urgent issues outside of office hours, we are not available 24/7.  ? ?If you have an urgent issue and are unable to reach [place], you may choose to seek medical care at your doctor's office, retail clinic, urgent care center, or emergency room. ? ?If you have a medical emergency, please immediately call 911 or go to the emergency department. ? ?Pager Numbers ? ?- Dr. Nehemiah Massed: [phone] ? ?- Dr. Laurence Ferrari: [phone] ? ?- Dr. Nicole Kindred: [phone] ? ?In the event of inclement weather, please call our main line at [phone] for an update on the status of any delays or closures. ? ?Dermatology Medication Tips: ?Please keep the boxes that topical medications come in in order to help keep track of the instructions about where and how to use these. Pharmacies typically print the medication instructions only on the boxes and not directly on the medication tubes.   ? ?If your medication is too expensive, please contact our office at [phone] option 4 or send [place] a message through [place].  ? ?We are unable to tell what your co-pay for medications will be in advance as this is different depending on your insurance coverage. However, we may be able to find a substitute medication at lower cost or fill out paperwork to get insurance to cover a needed medication.  ? ?If a prior authorization is required to get your medication covered by your insurance company, please allow [place] 1-2 business days to complete this process. ? ?Drug prices often vary depending on where the prescription is filled and some pharmacies may offer cheaper prices. ? ?The website www.goodrx.com contains coupons for medications through different pharmacies. The prices here do not account for what the cost may be with help from insurance (it may be cheaper with your insurance), but the website can give you the price if you did not use any insurance.  ?- You can print the associated coupon and take it with your prescription to the pharmacy.  ?- You may also stop by our office during regular business hours and pick up a GoodRx coupon card.  ?- If you need your prescription sent electronically to a different pharmacy, notify our office through [facility] or by phone at [phone] option 4. ? ? ? ? ?Si Usted Necesita Algo Despu?s de Su Visita ? ?Tambi?n puede enviarnos un  mensaje a trav?s de MyChart. Por lo general respondemos a los mensajes de MyChart en el transcurso de 1 a 2 d?as h?biles. ? ?Para renovar recetas, por favor pida a su farmacia que se ponga en contacto con nuestra oficina. Nuestro n?mero de fax es el [phone]. ? ?Si tiene un asunto urgente cuando la cl?nica est? cerrada y que no puede esperar hasta el siguiente d?a h?bil, puede llamar/localizar a su doctor(a) al n?mero que aparece a continuaci?n.  ? ?Por favor, tenga en cuenta que aunque hacemos todo lo posible para estar  disponibles para asuntos urgentes fuera del horario de oficina, no estamos disponibles las 24 horas del d?a, los 7 d?as de la semana.  ? ?Si tiene un problema urgente y no puede comunicarse con nosotros, puede optar por buscar atenci?n m?dica  en el consultorio de su doctor(a), en una cl?nica privada, en un centro de atenci?n urgente o en una sala de emergencias. ? ?Si tiene Engineer, maintenance (IT) m?dica, por favor llame inmediatamente al 911 o vaya a la sala de emergencias. ? ?N?meros de b?per ? ?- Dr. Nehemiah Massed: [phone] ? ?- Dra. Moye: [phone] ? ?- Dra. Nicole Kindred: [phone] ? ?En caso de inclemencias del tiempo, por favor llame a nuestra l?nea principal al [phone] para una actualizaci?n sobre el estado de cualquier retraso o cierre. ? ?Consejos para la medicaci?n en dermatolog?a: ?Por favor, guarde las cajas en las que vienen los medicamentos de uso t?pico para ayudarle a seguir las instrucciones sobre d?nde y c?mo usarlos. Las farmacias generalmente imprimen las instrucciones del medicamento s?lo en las cajas y no directamente en los tubos del [place].  ? ?Si su medicamento es muy caro, por favor, p?ngase en contacto con Zigmund Daniel llamando al [phone] y presione la opci?n 4 o env?enos un mensaje a trav?s de MyChart.  ? ?No podemos decirle cu?l ser? su copago por los medicamentos por adelantado ya que esto es diferente dependiendo de la cobertura de su seguro. Sin embargo, es posible que podamos encontrar un medicamento sustituto a Electrical engineer un formulario para que el seguro cubra el medicamento que se considera necesario.  ? ?Si se requiere Ardelia Mems autorizaci?n previa para que su compa??a de seguros [place] su medicamento, por favor perm?tanos de 1 a 2 d?as h?biles para completar este proceso. ? ?Los precios de los medicamentos var?an con frecuencia dependiendo del Environmental consultant de d?nde se surte la receta y alguna farmacias pueden ofrecer precios m?s baratos. ? ?El sitio web www.goodrx.com tiene  cupones para medicamentos de Airline pilot. Los precios aqu? no tienen en cuenta lo que podr?a costar con la ayuda del seguro (puede ser m?s barato con su seguro), pero el sitio web puede darle el precio si no utiliz? ning?n seguro.  ?- Puede imprimir el cup?n correspondiente y llevarlo con su receta a la farmacia.  ?- Tambi?n puede pasar por nuestra oficina durante el horario de atenci?n regular y recoger una tarjeta de cupones de GoodRx.  ?- Si necesita que su receta se env?e electr?nicamente a Chiropodist, informe a nuestra oficina a trav?s de MyChart de [place] o por tel?fono llamando al [phone] y presione la opci?n 4. ? ?

## 2021-10-29 NOTE — Progress Notes (Signed)
? ?  Follow-Up Visit ?  ?Subjective  ?Daniel Kline is a 76 y.o. male who presents for the following: Intertrigo with cellulitis (Of the L axilla - S/P Diflucan QW x 3 weeks, Cipro BID x 1 week, and Ketoconazole 2% cream QD. Patient states that rash has improved but is still very itchy. ) and Cellulitis (Of the L lat calf - S/P Cipro BID x 7 days and TMC PRN itch. Rash has improved and is healing per patient. ). ? ?The following portions of the chart were reviewed this encounter and updated as appropriate:  ? Tobacco  Allergies  Meds  Problems  Med Hx  Surg Hx  Fam Hx   ?  ?Review of Systems:  No other skin or systemic complaints except as noted in HPI or Assessment and Plan. ? ?Objective  ?Well appearing patient in no apparent distress; mood and affect are within normal limits. ? ?A focused examination was performed including L axilla, L calf. Relevant physical exam findings are noted in the Assessment and Plan. ? ?L lat calf ?Crusts.  ? ? ? ? ? 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  ? ?Start Mupirocin 2% ointment to aa's QD and  ?continue Ketoconazole 2% cream to aa QD.  ? ?Related Medications ?fluconazole (DIFLUCAN) 200 MG tablet ?Take one tab po twice per week for 3 weeks. ? 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 Condition is symptomatic / bothersome to patient. Not to goal. ? ?Continue TMC and Clobetasol to aa's QD-BID PRN.  ? ?Topical steroids (such as triamcinolone, fluocinolone, fluocinonide, mometasone, clobetasol, halobetasol, betamethasone, hydrocortisone) can cause thinning and lightening of the skin if they are used for too long in the same area. Your physician has selected the right strength medicine for your problem and area affected on the body.  Please use your medication only as directed by your physician to prevent side effects.  ? 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  ? ?Topical steroids (such as triamcinolone, fluocinolone, fluocinonide, mometasone, clobetasol, halobetasol, betamethasone, hydrocortisone) can cause thinning and lightening of the skin if they are used for too long in the same area. Your physician has selected the right strength medicine for your problem and area affected on the body. Please use your medication only as directed by your physician to prevent side effects.  ? ?mupirocin ointment (BACTROBAN) 2 % - L lat calf ?Apply to crusts on the L lower leg and L axilla QD. ?Related Medications ?triamcinolone cream (KENALOG) 0.1 % ?Apply to aa L axilla and L lower leg QD PRN. ? ?Return in about 8 weeks (around 12/24/2021). ? ?I, Rudell Cobb, CMA, am acting as scribe for Sarina Ser, MD . ?Documentation: I have reviewed the above documentation for accuracy and completeness, and I agree with the above. ? ?Sarina Ser, MD ? ?

## 2021-11-01 ENCOUNTER — Encounter: Payer: Self-pay | Admitting: Dermatology

## 2021-11-04 ENCOUNTER — Other Ambulatory Visit: Payer: Self-pay | Admitting: Internal Medicine

## 2021-11-04 ENCOUNTER — Other Ambulatory Visit: Payer: Self-pay | Admitting: Dermatology

## 2021-11-04 DIAGNOSIS — L03116 Cellulitis of left lower limb: Secondary | ICD-10-CM

## 2021-11-04 NOTE — Telephone Encounter (Signed)
Is this okay to refill ? ? Last filled 09/21/21 ?Last OV 09/21/21 ?Next OV 12/24/21 ? ?

## 2021-11-09 DIAGNOSIS — C8400 Mycosis fungoides, unspecified site: Secondary | ICD-10-CM | POA: Diagnosis not present

## 2021-11-25 ENCOUNTER — Other Ambulatory Visit: Payer: Self-pay | Admitting: Internal Medicine

## 2021-11-25 NOTE — Telephone Encounter (Signed)
Is this okay to refill? 

## 2021-11-26 ENCOUNTER — Other Ambulatory Visit: Payer: Self-pay | Admitting: Internal Medicine

## 2021-11-26 NOTE — Telephone Encounter (Signed)
Is this okay to refill? 

## 2021-11-30 DIAGNOSIS — M0609 Rheumatoid arthritis without rheumatoid factor, multiple sites: Secondary | ICD-10-CM | POA: Diagnosis not present

## 2021-11-30 DIAGNOSIS — Z7960 Long term (current) use of unspecified immunomodulators and immunosuppressants: Secondary | ICD-10-CM | POA: Diagnosis not present

## 2021-12-01 ENCOUNTER — Other Ambulatory Visit: Payer: Self-pay | Admitting: Internal Medicine

## 2021-12-12 DIAGNOSIS — J069 Acute upper respiratory infection, unspecified: Secondary | ICD-10-CM | POA: Diagnosis not present

## 2021-12-14 DIAGNOSIS — C8400 Mycosis fungoides, unspecified site: Secondary | ICD-10-CM | POA: Diagnosis not present

## 2021-12-23 ENCOUNTER — Ambulatory Visit: Payer: Medicare Other | Admitting: Dermatology

## 2021-12-23 ENCOUNTER — Other Ambulatory Visit: Payer: Self-pay | Admitting: Internal Medicine

## 2021-12-23 DIAGNOSIS — L304 Erythema intertrigo: Secondary | ICD-10-CM | POA: Diagnosis not present

## 2021-12-23 DIAGNOSIS — C84A0 Cutaneous T-cell lymphoma, unspecified, unspecified site: Secondary | ICD-10-CM

## 2021-12-23 DIAGNOSIS — L03116 Cellulitis of left lower limb: Secondary | ICD-10-CM

## 2021-12-23 MED ORDER — MUPIROCIN 2 % EX OINT: TOPICAL_OINTMENT | CUTANEOUS | 2 refills | Status: DC

## 2021-12-23 NOTE — Progress Notes (Signed)
   Follow-Up Visit   Subjective  Daniel Kline is a 76 y.o. male who presents for the following: Intertrigo (L axilla, Mupirocin oint prn, Ketoconazole cr 2% cr prn), hx fo Cellulitis  (L lat calf, Mupirocin oint prn open sores, TMC 0.1% cr prn itching), and Cutaneous T-Cell lymphoma (Skin, home light box 10mn 45 seconds 1x/wk pt would like to increase time, TMC 0.1% cr prn aa ).  The following portions of the chart were reviewed this encounter and updated as appropriate:   Tobacco  Allergies  Meds  Problems  Med Hx  Surg Hx  Fam Hx     Review of Systems:  No other skin or systemic complaints except as noted in HPI or Assessment and Plan.  Objective  Well appearing patient in no apparent distress; mood and affect are within normal limits.  A focused examination was performed including left axilla, left calf. Relevant physical exam findings are noted in the Assessment and Plan.  Left Abdomen (side) - Upper Clear today  Left Lower Leg - Anterior L lower leg clear  Left Axilla Pinkness in the pit         Assessment & Plan  Cutaneous T-cell lymphoma, unspecified body region ([facility] Left Abdomen (side) - Upper Chronic and persistent condition with duration or expected duration over one year. Condition is symptomatic / bothersome to patient. Not to goal.  Cont f/u with Dr. OIrish Elders-next appointment July 2023 Cont light box 1x/wk Cont Methotrexate as prescribed by Dr. OIrish EldersCont TMC 0.1% cr qd up to 5d/wk aa prn flares, avoid face, groin, axilla  Topical steroids (such as triamcinolone, fluocinolone, fluocinonide, mometasone, clobetasol, halobetasol, betamethasone, hydrocortisone) can cause thinning and lightening of the skin if they are used for too long in the same area. Your physician has selected the right strength medicine for your problem and area affected on the body. Please use your medication only as directed by your physician to prevent side effects.    Cellulitis  of left lower extremity Left Lower Leg - Anterior Resolved Cont Mupirocin oint prn open sores  Related Medications triamcinolone cream (KENALOG) 0.1 % Apply to aa L axilla and L lower leg QD PRN.  ciprofloxacin (CIPRO) 250 MG tablet Take 1 tablet (250 mg total) by mouth 2 (two) times daily.  mupirocin ointment (BACTROBAN) 2 % APPLY TO CRUSTS ON THE LEFT LOWER LEG AND LEFT AXILLA (UNDER ARM) EVERY DAY  Intertrigo Left Axilla See photo -improved from previous visit Intertrigo is a chronic recurrent rash that occurs in skin fold areas that may be associated with friction; heat; moisture; yeast; fungus; and bacteria.  It is exacerbated by increased movement / activity; sweating; and higher atmospheric temperature.  Improving Cont Ketoconazole 2% cr qd  Return in about 6 months (around 06/25/2022) for CTCL.  I, SOthelia Pulling RMA, am acting as scribe for DSarina Ser MD . Documentation: I have reviewed the above documentation for accuracy and completeness, and I agree with the above.  DSarina Ser MD

## 2021-12-23 NOTE — Patient Instructions (Addendum)
Continue Ketoconazole 2% cream once daily to left underarm until clear, then as needed for flares ? ? ? ?If You Need Anything After Your Visit ? ?If you have any questions or concerns for your doctor, please call our main line at [phone] and press option 4 to reach your doctor's medical assistant. If no one answers, please leave a voicemail as directed and we will return your call as soon as possible. Messages left after 4 pm will be answered the following business day.  ? ?You may also send [place] a message via MyChart. We typically respond to MyChart messages within 1-2 business days. ? ?For prescription refills, please ask your pharmacy to contact our office. Our fax number is [phone]. ? ?If you have an urgent issue when the clinic is closed that cannot wait until the next business day, you can page your doctor at the number below.   ? ?Please note that while we do our best to be available for urgent issues outside of office hours, we are not available 24/7.  ? ?If you have an urgent issue and are unable to reach [place], you may choose to seek medical care at your doctor's office, retail clinic, urgent care center, or emergency room. ? ?If you have a medical emergency, please immediately call 911 or go to the emergency department. ? ?Pager Numbers ? ?- Dr. Nehemiah Massed: [phone] ? ?- Dr. Laurence Ferrari: [phone] ? ?- Dr. Nicole Kindred: [phone] ? ?In the event of inclement weather, please call our main line at [phone] for an update on the status of any delays or closures. ? ?Dermatology Medication Tips: ?Please keep the boxes that topical medications come in in order to help keep track of the instructions about where and how to use these. Pharmacies typically print the medication instructions only on the boxes and not directly on the medication tubes.  ? ?If your medication is too expensive, please contact our office at [phone] option 4 or send [place] a message through [place].  ? ?We are unable to tell what your  co-pay for medications will be in advance as this is different depending on your insurance coverage. However, we may be able to find a substitute medication at lower cost or fill out paperwork to get insurance to cover a needed medication.  ? ?If a prior authorization is required to get your medication covered by your insurance company, please allow [place] 1-2 business days to complete this process. ? ?Drug prices often vary depending on where the prescription is filled and some pharmacies may offer cheaper prices. ? ?The website www.goodrx.com contains coupons for medications through different pharmacies. The prices here do not account for what the cost may be with help from insurance (it may be cheaper with your insurance), but the website can give you the price if you did not use any insurance.  ?- You can print the associated coupon and take it with your prescription to the pharmacy.  ?- You may also stop by our office during regular business hours and pick up a GoodRx coupon card.  ?- If you need your prescription sent electronically to a different pharmacy, notify our office through [facility] or by phone at [phone] option 4. ? ? ? ? ?Si Usted Necesita Algo Despu?s de Su Visita ? ?Tambi?n puede enviarnos un mensaje a trav?s de MyChart. Por lo general respondemos a los mensajes de MyChart en el transcurso de 1 a 2 d?as h?biles. ? ?Para renovar recetas, por favor pida a su  farmacia que se ponga en contacto con nuestra oficina. Nuestro n?mero de fax es el [phone]. ? ?Si tiene un asunto urgente cuando la cl?nica est? cerrada y que no puede esperar hasta el siguiente d?a h?bil, puede llamar/localizar a su doctor(a) al n?mero que aparece a continuaci?n.  ? ?Por favor, tenga en cuenta que aunque hacemos todo lo posible para estar disponibles para asuntos urgentes fuera del horario de oficina, no estamos disponibles las 24 horas del d?a, los 7 d?as de la semana.  ? ?Si tiene un problema urgente y no  puede comunicarse con nosotros, puede optar por buscar atenci?n m?dica  en el consultorio de su doctor(a), en una cl?nica privada, en un centro de atenci?n urgente o en una sala de emergencias. ? ?Si tiene Engineer, maintenance (IT) m?dica, por favor llame inmediatamente al 911 o vaya a la sala de emergencias. ? ?N?meros de b?per ? ?- Dr. Nehemiah Massed: [phone] ? ?- Dra. Moye: [phone] ? ?- Dra. Nicole Kindred: [phone] ? ?En caso de inclemencias del tiempo, por favor llame a nuestra l?nea principal al [phone] para una actualizaci?n sobre el estado de cualquier retraso o cierre. ? ?Consejos para la medicaci?n en dermatolog?a: ?Por favor, guarde las cajas en las que vienen los medicamentos de uso t?pico para ayudarle a seguir las instrucciones sobre d?nde y c?mo usarlos. Las farmacias generalmente imprimen las instrucciones del medicamento s?lo en las cajas y no directamente en los tubos del [place].  ? ?Si su medicamento es muy caro, por favor, p?ngase en contacto con Zigmund Daniel llamando al [phone] y presione la opci?n 4 o env?enos un mensaje a trav?s de MyChart.  ? ?No podemos decirle cu?l ser? su copago por los medicamentos por adelantado ya que esto es diferente dependiendo de la cobertura de su seguro. Sin embargo, es posible que podamos encontrar un medicamento sustituto a Electrical engineer un formulario para que el seguro cubra el medicamento que se considera necesario.  ? ?Si se requiere Ardelia Mems autorizaci?n previa para que su compa??a de seguros [place] su medicamento, por favor perm?tanos de 1 a 2 d?as h?biles para completar este proceso. ? ?Los precios de los medicamentos var?an con frecuencia dependiendo del Environmental consultant de d?nde se surte la receta y alguna farmacias pueden ofrecer precios m?s baratos. ? ?El sitio web www.goodrx.com tiene cupones para medicamentos de Airline pilot. Los precios aqu? no tienen en cuenta lo que podr?a costar con la ayuda del seguro (puede ser m?s barato con su seguro),  pero el sitio web puede darle el precio si no utiliz? ning?n seguro.  ?- Puede imprimir el cup?n correspondiente y llevarlo con su receta a la farmacia.  ?- Tambi?n puede pasar por nuestra oficina durante el horario de atenci?n regular y recoger una tarjeta de cupones de GoodRx.  ?- Si necesita que su receta se env?e electr?nicamente a Chiropodist, informe a nuestra oficina a trav?s de MyChart de [place] o por tel?fono llamando al [phone] y presione la opci?n 4.  ?

## 2021-12-23 NOTE — Telephone Encounter (Signed)
Last filled on 09/28/21 ?Last OV 09/21/21 ?Next 12/24/21 ? ?Is this okay to fill  ?

## 2021-12-24 ENCOUNTER — Ambulatory Visit: Payer: Medicare Other | Admitting: Internal Medicine

## 2021-12-24 ENCOUNTER — Encounter: Payer: Self-pay | Admitting: Internal Medicine

## 2021-12-24 VITALS — BP 122/84 | HR 76 | Temp 97.6°F | Ht 65.0 in | Wt 233.0 lb

## 2021-12-24 DIAGNOSIS — M545 Low back pain, unspecified: Secondary | ICD-10-CM | POA: Diagnosis not present

## 2021-12-24 DIAGNOSIS — F1120 Opioid dependence, uncomplicated: Secondary | ICD-10-CM

## 2021-12-24 DIAGNOSIS — E1121 Type 2 diabetes mellitus with diabetic nephropathy: Secondary | ICD-10-CM

## 2021-12-24 DIAGNOSIS — G8929 Other chronic pain: Secondary | ICD-10-CM | POA: Diagnosis not present

## 2021-12-24 DIAGNOSIS — I10 Essential (primary) hypertension: Secondary | ICD-10-CM | POA: Diagnosis not present

## 2021-12-24 DIAGNOSIS — F39 Unspecified mood [affective] disorder: Secondary | ICD-10-CM

## 2021-12-24 LAB — POCT GLYCOSYLATED HEMOGLOBIN (HGB A1C): Hemoglobin A1C: 5.8 % — AB (ref 4.0–5.6)

## 2021-12-24 MED ORDER — CLORAZEPATE DIPOTASSIUM 7.5 MG PO TABS: 7.5000 mg | ORAL_TABLET | Freq: Two times a day (BID) | ORAL | 0 refills | Status: DC | PRN

## 2021-12-24 MED ORDER — VICTOZA 18 MG/3ML ~~LOC~~ SOPN: 1.2000 mg | PEN_INJECTOR | Freq: Every day | SUBCUTANEOUS | 0 refills | Status: DC

## 2021-12-24 NOTE — Progress Notes (Signed)
Subjective:    Patient ID: Daniel Kline, male    DOB: 1946-05-26, 76 y.o.   MRN: [id number]  HPI Here for follow up of chronic pain and diabetes  He feels the victoza is causing a stomach ache Actually vomited up breakfast today If skips --no stomach pain (misses once or twice a week) Sugars as low as 59, today 71. Not over 100 fasting Continues on glipizide 2.5 bid also  Had bronchitis---got it from his wife Martin Majestic to Urgent Care----almost 2 weeks ago Sudafed and cough med Mostly better now No SOB  Pain is about the same He feels it daily--but copes Continues on hydrocodone--only once a day generally but needs more some days Regularly gets #60 [per month  Current Outpatient Medications on File Prior to Visit  Medication Sig Dispense Refill   amLODipine (NORVASC) 5 MG tablet Take 1 tablet (5 mg total) by mouth daily. 90 tablet 3   carvedilol (COREG) 12.5 MG tablet TAKE ONE (1) TABLET BY MOUTH TWO TIMES PER DAY 180 tablet 3   celecoxib (CELEBREX) 200 MG capsule TAKE 1 CAPSULE BY MOUTH EVERY DAY 30 capsule 11   Cholecalciferol (VITAMIN D-3 PO) Take 10,000 Units by mouth daily.      clobetasol cream (TEMOVATE) 2.95 % Apply 1 application topically 2 (two) times daily as needed (irritation).      clorazepate (TRANXENE) 7.5 MG tablet TAKE 1 TABLET (7.5 MG TOTAL) BY MOUTH 2 (TWO) TIMES DAILY AS NEEDED FOR ANXIETY. 60 tablet 0   diclofenac Sodium (VOLTAREN) 1 % GEL APPLY 2 GRAMS TOPICALLY FOUR TIMES DAILY 100 g 11   Elastic Bandages & Supports (MEDICAL COMPRESSION STOCKINGS) MISC 2 Units by Does not apply route daily. 2 each 0   EPINEPHrine 0.3 mg/0.3 mL IJ SOAJ injection Inject 0.3 mLs (0.3 mg total) into the muscle as needed for anaphylaxis. 1 each 5   Ferrous Sulfate (IRON PO) Take 1 tablet by mouth at bedtime.     fluconazole (DIFLUCAN) 200 MG tablet Take one tab po twice per week for 3 weeks. 6 tablet 0   folic acid (FOLVITE) 1 MG tablet Take 1 mg by mouth daily.     furosemide  (LASIX) 40 MG tablet Take 1 tablet (40 mg total) by mouth 2 (two) times daily. 180 tablet 3   gabapentin (NEURONTIN) 300 MG capsule TAKE 1 CAPSULE BY MOUTH 3  TIMES DAILY 270 capsule 3   glipiZIDE (GLUCOTROL) 5 MG tablet TAKE ONE-HALF TABLET BY  MOUTH TWICE DAILY BEFORE  MEALS 90 tablet 3   HYDROcodone-acetaminophen (NORCO/VICODIN) 5-325 MG tablet TAKE 1 TABLET BY MOUTH EVERY SIX HOURS AS NEEDED FOR PAIN 60 tablet 0   hydrOXYzine (ATARAX) 25 MG tablet TAKE ONE TABLET EVERY EIGHT HOURS AS NEEDED 90 tablet 3   isosorbide mononitrate (IMDUR) 60 MG 24 hr tablet Take 1 tablet (60 mg total) by mouth daily before lunch. 90 tablet 3   ketoconazole (NIZORAL) 2 % cream Apply twice daily to under arm and feet until clear then one additional week 60 g 2   losartan (COZAAR) 100 MG tablet Take 1 tablet (100 mg total) by mouth daily. 90 tablet 3   MAGNESIUM PO Take by mouth.     metFORMIN (GLUCOPHAGE) 500 MG tablet TAKE 1 TABLET BY MOUTH TWICE DAILY 180 tablet 3   methotrexate 2.5 MG tablet Take 6 tablets by mouth once a week.     Multiple Vitamins-Minerals (MULTIVITAL) tablet Take 1 tablet by mouth daily.  mupirocin ointment (BACTROBAN) 2 % APPLY TO CRUSTS ON THE LEFT LOWER LEG AND LEFT AXILLA (UNDER ARM) EVERY DAY 22 g 2   ONETOUCH ULTRA test strip CHECK BLOOD SUGAR 3 TIMES DAILY AS DIRECTED 100 each 1   rosuvastatin (CRESTOR) 20 MG tablet TAKE 1 TABLET BY MOUTH  DAILY 90 tablet 3   timolol (TIMOPTIC) 0.5 % ophthalmic solution Apply 1 drop to eye 2 (two) times daily.     triamcinolone cream (KENALOG) 0.1 % Apply to aa L axilla and L lower leg QD PRN. 80 g 0   VICTOZA 18 MG/3ML SOPN INJECT 1.'8MG'$  UNDER THE SKIN DAILY AS DIRECTED 9 mL 11   No current facility-administered medications on file prior to visit.    Allergies  Allergen Reactions   Bee Venom Anaphylaxis   Oxycodone Other (See Comments)    Delusions Delusions   Delusions   Hydromorphone Other (See Comments)    hallucinating   Zolpidem  Other (See Comments)    Past Medical History:  Diagnosis Date   Anemia    H/O   Anxiety    Arthritis    Chronic kidney disease    Complication of anesthesia    CTCL (cutaneous T-cell lymphoma) (HCC)    Diabetes mellitus without complication ([place])    Dysplastic nevus 12/19/2017   Right distal lat. forearm near wrist. Severe atypia, close to peripheral margin.   Dysplastic nevus 06/21/2018   Upper back right paraspinal. Severe atypia, peripheral margin involved. Excised 07/11/2018, margins free.   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 forearm. SCCis, hypertrophic.    Past Surgical History:  Procedure Laterality Date   BACK SURGERY     LUMBAR   CARDIAC ELECTROPHYSIOLOGY STUDY AND ABLATION  2019   COLONOSCOPY WITH PROPOFOL N/A 04/04/2019   Procedure: COLONOSCOPY WITH PROPOFOL;  Surgeon: Toledo, Benay Pike, MD;  Location: [facility] ENDOSCOPY;  Service: Gastroenterology;  Laterality: N/A;   I & D EXTREMITY Right 02/01/2020   Procedure: IRRIGATION AND DEBRIDEMENT EXTREMITY with poly exchange;  Surgeon: Dereck Leep, MD;  Location: [facility] ORS;  Service: Orthopedics;  Laterality: Right;   INCISION AND DRAINAGE     BACK-MRSA INFECTION AFTER BACK SURGERY   KNEE ARTHROPLASTY Right 01/28/2020   Procedure: COMPUTER ASSISTED TOTAL KNEE ARTHROPLASTY;  Surgeon: Dereck Leep, MD;  Location: [facility] ORS;  Service: Orthopedics;  Laterality: Right;   MOUTH SURGERY     right elbow  surgery     right knee surgery     right shoulder surgery     from polio damage   TONSILLECTOMY     TOTAL HIP ARTHROPLASTY Bilateral 04/2016    Family History  Adopted: Yes    Social History   Socioeconomic History   Marital status: Married    Spouse name: Not on file   Number of children: 0  Years of education: Not on file   Highest education level: Not on file  Occupational History   Occupation: Nature conservation officer    Comment: 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   Occupation: Cytogeneticist    Comment: Retired   Occupation: Warden/ranger    Comment: Retired  Tobacco Use   Smoking status: Former    Types: Cigars    Quit date: 09/23/1990    Years since quitting: 31.2   Smokeless tobacco: Former    Quit date: 02/25/2006  Vaping Use   Vaping Use: Never used  Substance and Sexual Activity   Alcohol use: No    Alcohol/week: 0.0 standard drinks   Drug use: No   Sexual activity: Yes    Partners: Female  Other Topics Concern   Not on file  Social History Narrative   Has living will   Wife is health care POA---then brother or sister   Would accept resuscitation attempts but no prolonged ventilation or tube feeds   Social Determinants of Health   Financial Resource Strain: Not on file  Food Insecurity: Not on file  Transportation Needs: Not on file  Physical Activity: Not on file  Stress: Not on file  Social Connections: Not on file  Intimate Partner Violence: Not on file   Review of Systems Doesn't sleep well--occasionally uses the clorazepate. But then out of stock Bowels are fine Appetite is off Has lost considerable weight     Objective:   Physical Exam Constitutional:      Appearance: Normal appearance.  Cardiovascular:     Rate and Rhythm: Normal rate and regular rhythm.     Pulses: Normal pulses.     Heart sounds: No murmur heard.   No gallop.     Comments: Occ skips Pulmonary:     Effort: Pulmonary effort is normal.      Breath sounds: Normal breath sounds. No wheezing or rales.  Musculoskeletal:     Cervical back: Neck supple.  Lymphadenopathy:     Cervical: No cervical adenopathy.  Neurological:     Mental Status: He is alert.  Psychiatric:        Mood and Affect: Mood normal.        Behavior: Behavior normal.           Assessment & Plan:

## 2021-12-24 NOTE — Assessment & Plan Note (Signed)
PDMP reviewed  No concerns Due for UDS

## 2021-12-24 NOTE — Assessment & Plan Note (Signed)
Ongoing pain Uses the hydrocodone twice a day on average Diclofenac gel as well

## 2021-12-24 NOTE — Assessment & Plan Note (Signed)
BP Readings from Last 3 Encounters:  12/24/21 122/84  09/21/21 128/80  06/18/21 (!) 140/92   Good control on losartan 100, isosorbide 60, carvedilol 12.5 bid

## 2021-12-24 NOTE — Assessment & Plan Note (Signed)
Some anxiety Sleep problems----he will refill the clorazepate since that helps

## 2021-12-24 NOTE — Assessment & Plan Note (Signed)
A1c down to 5.8% today!!  Will have him cut the victoza to 1.'2mg'$  daily---and even further if the nausea persists Glipizide 2.5 bid also

## 2021-12-26 LAB — DRUG MONITORING, PANEL 8 WITH CONFIRMATION, URINE
6 Acetylmorphine: NEGATIVE ng/mL (ref <10)
Alcohol Metabolites: NEGATIVE ng/mL (ref <500)
Alphahydroxyalprazolam: NEGATIVE ng/mL (ref <25)
Alphahydroxymidazolam: NEGATIVE ng/mL (ref <50)
Alphahydroxytriazolam: NEGATIVE ng/mL (ref <50)
Aminoclonazepam: NEGATIVE ng/mL (ref <25)
Amphetamines: NEGATIVE ng/mL (ref <500)
Benzodiazepines: POSITIVE ng/mL — AB (ref <100)
Buprenorphine, Urine: NEGATIVE ng/mL (ref <5)
Cocaine Metabolite: NEGATIVE ng/mL (ref <150)
Codeine: NEGATIVE ng/mL (ref <50)
Creatinine: 107 mg/dL (ref >=20.0)
Hydrocodone: 160 ng/mL — ABNORMAL HIGH (ref <50)
Hydromorphone: 93 ng/mL — ABNORMAL HIGH (ref <50)
Hydroxyethylflurazepam: NEGATIVE ng/mL (ref <50)
Lorazepam: NEGATIVE ng/mL (ref <50)
MDMA: NEGATIVE ng/mL (ref <500)
Marijuana Metabolite: NEGATIVE ng/mL (ref <20)
Morphine: NEGATIVE ng/mL (ref <50)
Nordiazepam: NEGATIVE ng/mL (ref <50)
Norhydrocodone: 294 ng/mL — ABNORMAL HIGH (ref <50)
Opiates: POSITIVE ng/mL — AB (ref <100)
Oxazepam: 87 ng/mL — ABNORMAL HIGH (ref <50)
Oxidant: NEGATIVE ug/mL (ref <200)
Oxycodone: NEGATIVE ng/mL (ref <100)
Temazepam: NEGATIVE ng/mL (ref <50)
pH: 5.3 (ref 4.5–9.0)

## 2021-12-26 LAB — DM TEMPLATE

## 2022-01-02 ENCOUNTER — Encounter: Payer: Self-pay | Admitting: Dermatology

## 2022-01-07 DIAGNOSIS — I4891 Unspecified atrial fibrillation: Secondary | ICD-10-CM

## 2022-01-07 HISTORY — DX: Unspecified atrial fibrillation: I48.91

## 2022-01-21 ENCOUNTER — Other Ambulatory Visit: Payer: Self-pay

## 2022-01-21 ENCOUNTER — Emergency Department: Payer: Medicare Other

## 2022-01-21 ENCOUNTER — Encounter: Payer: Self-pay | Admitting: Emergency Medicine

## 2022-01-21 ENCOUNTER — Inpatient Hospital Stay
Admission: EM | Admit: 2022-01-21 | Discharge: 2022-01-27 | DRG: 871 | Disposition: A | Discharge status: Home / Self Care | Payer: Medicare Other | Attending: Internal Medicine | Admitting: Internal Medicine

## 2022-01-21 ENCOUNTER — Other Ambulatory Visit: Payer: Self-pay | Admitting: Internal Medicine

## 2022-01-21 DIAGNOSIS — Z7984 Long term (current) use of oral hypoglycemic drugs: Secondary | ICD-10-CM | POA: Diagnosis not present

## 2022-01-21 DIAGNOSIS — D709 Neutropenia, unspecified: Secondary | ICD-10-CM | POA: Diagnosis not present

## 2022-01-21 DIAGNOSIS — E1122 Type 2 diabetes mellitus with diabetic chronic kidney disease: Secondary | ICD-10-CM | POA: Diagnosis not present

## 2022-01-21 DIAGNOSIS — G4733 Obstructive sleep apnea (adult) (pediatric): Secondary | ICD-10-CM | POA: Diagnosis present

## 2022-01-21 DIAGNOSIS — Z85828 Personal history of other malignant neoplasm of skin: Secondary | ICD-10-CM

## 2022-01-21 DIAGNOSIS — G8929 Other chronic pain: Secondary | ICD-10-CM | POA: Diagnosis not present

## 2022-01-21 DIAGNOSIS — R768 Other specified abnormal immunological findings in serum: Secondary | ICD-10-CM | POA: Diagnosis present

## 2022-01-21 DIAGNOSIS — U071 COVID-19: Secondary | ICD-10-CM | POA: Diagnosis not present

## 2022-01-21 DIAGNOSIS — K219 Gastro-esophageal reflux disease without esophagitis: Secondary | ICD-10-CM | POA: Diagnosis not present

## 2022-01-21 DIAGNOSIS — Z96643 Presence of artificial hip joint, bilateral: Secondary | ICD-10-CM | POA: Diagnosis present

## 2022-01-21 DIAGNOSIS — D849 Immunodeficiency, unspecified: Secondary | ICD-10-CM | POA: Diagnosis present

## 2022-01-21 DIAGNOSIS — G894 Chronic pain syndrome: Secondary | ICD-10-CM | POA: Diagnosis not present

## 2022-01-21 DIAGNOSIS — A4189 Other specified sepsis: Principal | ICD-10-CM | POA: Diagnosis present

## 2022-01-21 DIAGNOSIS — N2581 Secondary hyperparathyroidism of renal origin: Secondary | ICD-10-CM | POA: Diagnosis not present

## 2022-01-21 DIAGNOSIS — A419 Sepsis, unspecified organism: Secondary | ICD-10-CM

## 2022-01-21 DIAGNOSIS — R410 Disorientation, unspecified: Secondary | ICD-10-CM | POA: Diagnosis not present

## 2022-01-21 DIAGNOSIS — D631 Anemia in chronic kidney disease: Secondary | ICD-10-CM | POA: Diagnosis present

## 2022-01-21 DIAGNOSIS — Z743 Need for continuous supervision: Secondary | ICD-10-CM | POA: Diagnosis not present

## 2022-01-21 DIAGNOSIS — Z87891 Personal history of nicotine dependence: Secondary | ICD-10-CM | POA: Diagnosis not present

## 2022-01-21 DIAGNOSIS — R059 Cough, unspecified: Secondary | ICD-10-CM | POA: Diagnosis not present

## 2022-01-21 DIAGNOSIS — N184 Chronic kidney disease, stage 4 (severe): Secondary | ICD-10-CM | POA: Diagnosis present

## 2022-01-21 DIAGNOSIS — I10 Essential (primary) hypertension: Secondary | ICD-10-CM | POA: Diagnosis present

## 2022-01-21 DIAGNOSIS — E1121 Type 2 diabetes mellitus with diabetic nephropathy: Secondary | ICD-10-CM | POA: Diagnosis not present

## 2022-01-21 DIAGNOSIS — E7800 Pure hypercholesterolemia, unspecified: Secondary | ICD-10-CM | POA: Diagnosis present

## 2022-01-21 DIAGNOSIS — Z79899 Other long term (current) drug therapy: Secondary | ICD-10-CM

## 2022-01-21 DIAGNOSIS — N1832 Chronic kidney disease, stage 3b: Secondary | ICD-10-CM | POA: Diagnosis present

## 2022-01-21 DIAGNOSIS — M069 Rheumatoid arthritis, unspecified: Secondary | ICD-10-CM | POA: Diagnosis not present

## 2022-01-21 DIAGNOSIS — I1310 Hypertensive heart and chronic kidney disease without heart failure, with stage 1 through stage 4 chronic kidney disease, or unspecified chronic kidney disease: Secondary | ICD-10-CM | POA: Diagnosis not present

## 2022-01-21 DIAGNOSIS — N189 Chronic kidney disease, unspecified: Secondary | ICD-10-CM | POA: Diagnosis not present

## 2022-01-21 DIAGNOSIS — I480 Paroxysmal atrial fibrillation: Secondary | ICD-10-CM | POA: Clinically undetermined

## 2022-01-21 DIAGNOSIS — Z6838 Body mass index (BMI) 38.0-38.9, adult: Secondary | ICD-10-CM

## 2022-01-21 DIAGNOSIS — R6889 Other general symptoms and signs: Secondary | ICD-10-CM | POA: Diagnosis not present

## 2022-01-21 DIAGNOSIS — G319 Degenerative disease of nervous system, unspecified: Secondary | ICD-10-CM | POA: Diagnosis not present

## 2022-01-21 DIAGNOSIS — I129 Hypertensive chronic kidney disease with stage 1 through stage 4 chronic kidney disease, or unspecified chronic kidney disease: Secondary | ICD-10-CM | POA: Diagnosis not present

## 2022-01-21 DIAGNOSIS — G9341 Metabolic encephalopathy: Secondary | ICD-10-CM | POA: Diagnosis not present

## 2022-01-21 DIAGNOSIS — R531 Weakness: Secondary | ICD-10-CM | POA: Diagnosis not present

## 2022-01-21 DIAGNOSIS — Z28310 Unvaccinated for covid-19: Secondary | ICD-10-CM

## 2022-01-21 DIAGNOSIS — Z96651 Presence of right artificial knee joint: Secondary | ICD-10-CM | POA: Diagnosis present

## 2022-01-21 DIAGNOSIS — R5081 Fever presenting with conditions classified elsewhere: Secondary | ICD-10-CM | POA: Diagnosis present

## 2022-01-21 DIAGNOSIS — R4182 Altered mental status, unspecified: Secondary | ICD-10-CM | POA: Diagnosis not present

## 2022-01-21 DIAGNOSIS — R0602 Shortness of breath: Secondary | ICD-10-CM | POA: Diagnosis not present

## 2022-01-21 DIAGNOSIS — I6782 Cerebral ischemia: Secondary | ICD-10-CM | POA: Diagnosis not present

## 2022-01-21 DIAGNOSIS — Z8572 Personal history of non-Hodgkin lymphomas: Secondary | ICD-10-CM

## 2022-01-21 LAB — CBC WITH DIFFERENTIAL/PLATELET
Abs Immature Granulocytes: 0.02 10*3/uL (ref 0.00–0.07)
Basophils Absolute: 0 10*3/uL (ref 0.0–0.1)
Basophils Relative: 2 %
Eosinophils Absolute: 0 10*3/uL (ref 0.0–0.5)
Eosinophils Relative: 2 %
HCT: 34.1 % — ABNORMAL LOW (ref 39.0–52.0)
Hemoglobin: 11.1 g/dL — ABNORMAL LOW (ref 13.0–17.0)
Immature Granulocytes: 2 %
Lymphocytes Relative: 29 %
Lymphs Abs: 0.4 10*3/uL — ABNORMAL LOW (ref 0.7–4.0)
MCH: 30.9 pg (ref 26.0–34.0)
MCHC: 32.6 g/dL (ref 30.0–36.0)
MCV: 95 fL (ref 80.0–100.0)
Monocytes Absolute: 0.5 10*3/uL (ref 0.1–1.0)
Monocytes Relative: 35 %
Neutro Abs: 0.4 10*3/uL — CL (ref 1.7–7.7)
Neutrophils Relative %: 30 %
Platelets: 416 10*3/uL — ABNORMAL HIGH (ref 150–400)
RBC: 3.59 MIL/uL — ABNORMAL LOW (ref 4.22–5.81)
RDW: 17.1 % — ABNORMAL HIGH (ref 11.5–15.5)
Smear Review: NORMAL
WBC: 1.3 10*3/uL — CL (ref 4.0–10.5)
nRBC: 1.6 % — ABNORMAL HIGH (ref 0.0–0.2)

## 2022-01-21 LAB — COMPREHENSIVE METABOLIC PANEL
ALT: 17 U/L (ref 0–44)
AST: 25 U/L (ref 15–41)
Albumin: 4 g/dL (ref 3.5–5.0)
Alkaline Phosphatase: 55 U/L (ref 38–126)
Anion gap: 8 (ref 5–15)
BUN: 15 mg/dL (ref 8–23)
CO2: 26 mmol/L (ref 22–32)
Calcium: 9.2 mg/dL (ref 8.9–10.3)
Chloride: 101 mmol/L (ref 98–111)
Creatinine, Ser: 2.04 mg/dL — ABNORMAL HIGH (ref 0.61–1.24)
GFR, Estimated: 33 mL/min — ABNORMAL LOW (ref >60)
Glucose, Bld: 154 mg/dL — ABNORMAL HIGH (ref 70–99)
Potassium: 4 mmol/L (ref 3.5–5.1)
Sodium: 135 mmol/L (ref 135–145)
Total Bilirubin: 0.9 mg/dL (ref 0.3–1.2)
Total Protein: 7.2 g/dL (ref 6.5–8.1)

## 2022-01-21 LAB — BRAIN NATRIURETIC PEPTIDE: B Natriuretic Peptide: 81.6 pg/mL (ref 0.0–100.0)

## 2022-01-21 LAB — URINALYSIS, COMPLETE (UACMP) WITH MICROSCOPIC
Bilirubin Urine: NEGATIVE
Glucose, UA: 50 mg/dL — AB
Ketones, ur: NEGATIVE mg/dL
Leukocytes,Ua: NEGATIVE
Nitrite: NEGATIVE
Protein, ur: 30 mg/dL — AB
Specific Gravity, Urine: 1.008 (ref 1.005–1.030)
Squamous Epithelial / HPF: NONE SEEN (ref 0–5)
pH: 8 (ref 5.0–8.0)

## 2022-01-21 LAB — SARS CORONAVIRUS 2 BY RT PCR: SARS Coronavirus 2 by RT PCR: POSITIVE — AB

## 2022-01-21 LAB — BLOOD GAS, VENOUS
Acid-Base Excess: 3.8 mmol/L — ABNORMAL HIGH (ref 0.0–2.0)
Bicarbonate: 29.7 mmol/L — ABNORMAL HIGH (ref 20.0–28.0)
O2 Saturation: 38 %
Patient temperature: 37
pCO2, Ven: 49 mmHg (ref 44–60)
pH, Ven: 7.39 (ref 7.25–7.43)

## 2022-01-21 LAB — TROPONIN I (HIGH SENSITIVITY)
Troponin I (High Sensitivity): 18 ng/L — ABNORMAL HIGH (ref <18)
Troponin I (High Sensitivity): 22 ng/L — ABNORMAL HIGH (ref <18)

## 2022-01-21 LAB — LACTIC ACID, PLASMA
Lactic Acid, Venous: 1.6 mmol/L (ref 0.5–1.9)
Lactic Acid, Venous: 1.1 mmol/L (ref 0.5–1.9)

## 2022-01-21 LAB — PATHOLOGIST SMEAR REVIEW

## 2022-01-21 LAB — PROCALCITONIN: Procalcitonin: <0.1 ng/mL

## 2022-01-21 LAB — MRSA NEXT GEN BY PCR, NASAL: MRSA by PCR Next Gen: NOT DETECTED

## 2022-01-21 MED ORDER — POLYETHYLENE GLYCOL 3350 17 G PO PACK
17.0000 g | PACK | Freq: Every day | ORAL | Status: DC | PRN
Start: 2022-01-21 — End: 2022-01-27

## 2022-01-21 MED ORDER — GABAPENTIN 300 MG PO CAPS
300.0000 mg | ORAL_CAPSULE | Freq: Three times a day (TID) | ORAL | Status: DC
  Administered 2022-01-21 – 2022-01-27 (×17): 300 mg via ORAL
  Filled 2022-01-21 (×17): qty 1

## 2022-01-21 MED ORDER — FUROSEMIDE 40 MG PO TABS
40.0000 mg | ORAL_TABLET | Freq: Two times a day (BID) | ORAL | Status: DC
  Administered 2022-01-22 – 2022-01-27 (×11): 40 mg via ORAL
  Filled 2022-01-21 (×11): qty 1

## 2022-01-21 MED ORDER — AMLODIPINE BESYLATE 5 MG PO TABS
5.0000 mg | ORAL_TABLET | Freq: Every day | ORAL | Status: DC
Start: 2022-01-22 — End: 2022-01-24
  Administered 2022-01-22 – 2022-01-23 (×2): 5 mg via ORAL
  Filled 2022-01-21 (×2): qty 1

## 2022-01-21 MED ORDER — CARVEDILOL 12.5 MG PO TABS
12.5000 mg | ORAL_TABLET | Freq: Two times a day (BID) | ORAL | Status: DC
Start: 2022-01-21 — End: 2022-01-23
  Administered 2022-01-21 – 2022-01-23 (×4): 12.5 mg via ORAL
  Filled 2022-01-21 (×4): qty 1

## 2022-01-21 MED ORDER — VANCOMYCIN HCL 1250 MG/250ML IV SOLN: 1250.0000 mg | INTRAVENOUS | Status: DC

## 2022-01-21 MED ORDER — ENOXAPARIN SODIUM 40 MG/0.4ML IJ SOSY
40.0000 mg | PREFILLED_SYRINGE | Freq: Every day | INTRAMUSCULAR | Status: DC
  Administered 2022-01-21 – 2022-01-22 (×2): 40 mg via SUBCUTANEOUS
  Filled 2022-01-21 (×2): qty 0.4

## 2022-01-21 MED ORDER — GUAIFENESIN ER 600 MG PO TB12
600.0000 mg | ORAL_TABLET | Freq: Two times a day (BID) | ORAL | Status: DC | PRN
Start: 2022-01-21 — End: 2022-01-23
  Administered 2022-01-21 – 2022-01-23 (×3): 600 mg via ORAL
  Filled 2022-01-21 (×3): qty 1

## 2022-01-21 MED ORDER — FOLIC ACID 1 MG PO TABS
1.0000 mg | ORAL_TABLET | Freq: Every day | ORAL | Status: DC
  Administered 2022-01-22 – 2022-01-27 (×6): 1 mg via ORAL
  Filled 2022-01-21 (×6): qty 1

## 2022-01-21 MED ORDER — LOSARTAN POTASSIUM 50 MG PO TABS
100.0000 mg | ORAL_TABLET | Freq: Every day | ORAL | Status: DC
Start: 2022-01-22 — End: 2022-01-24
  Administered 2022-01-22 – 2022-01-23 (×2): 100 mg via ORAL
  Filled 2022-01-21 (×2): qty 2

## 2022-01-21 MED ORDER — VANCOMYCIN HCL IN DEXTROSE 1-5 GM/200ML-% IV SOLN
1000.0000 mg | Freq: Once | INTRAVENOUS | Status: DC
  Filled 2022-01-21: qty 200

## 2022-01-21 MED ORDER — ONDANSETRON HCL 4 MG/2ML IJ SOLN: 4.0000 mg | Freq: Four times a day (QID) | INTRAMUSCULAR | Status: DC | PRN

## 2022-01-21 MED ORDER — ONDANSETRON HCL 4 MG PO TABS
4.0000 mg | ORAL_TABLET | Freq: Four times a day (QID) | ORAL | Status: DC | PRN
  Administered 2022-01-24: 4 mg via ORAL
  Filled 2022-01-21: qty 1

## 2022-01-21 MED ORDER — LACTATED RINGERS IV BOLUS
1000.0000 mL | Freq: Once | INTRAVENOUS | Status: AC
  Administered 2022-01-21: 1000 mL via INTRAVENOUS

## 2022-01-21 MED ORDER — ROSUVASTATIN CALCIUM 10 MG PO TABS
20.0000 mg | ORAL_TABLET | Freq: Every day | ORAL | Status: DC
  Administered 2022-01-22 – 2022-01-27 (×6): 20 mg via ORAL
  Filled 2022-01-21 (×6): qty 2

## 2022-01-21 MED ORDER — CLORAZEPATE DIPOTASSIUM 7.5 MG PO TABS: 7.5000 mg | ORAL_TABLET | Freq: Two times a day (BID) | ORAL | Status: DC | PRN

## 2022-01-21 MED ORDER — HYDROCODONE-ACETAMINOPHEN 5-325 MG PO TABS
1.0000 | ORAL_TABLET | Freq: Four times a day (QID) | ORAL | Status: DC | PRN
  Administered 2022-01-21 – 2022-01-27 (×8): 1 via ORAL
  Filled 2022-01-21 (×8): qty 1

## 2022-01-21 MED ORDER — ACETAMINOPHEN 325 MG PO TABS
650.0000 mg | ORAL_TABLET | Freq: Four times a day (QID) | ORAL | Status: DC | PRN
  Administered 2022-01-24 – 2022-01-26 (×2): 650 mg via ORAL
  Filled 2022-01-21 (×2): qty 2

## 2022-01-21 MED ORDER — HYDRALAZINE HCL 20 MG/ML IJ SOLN: 5.0000 mg | Freq: Four times a day (QID) | INTRAMUSCULAR | Status: AC | PRN

## 2022-01-21 MED ORDER — METRONIDAZOLE 500 MG/100ML IV SOLN
500.0000 mg | Freq: Two times a day (BID) | INTRAVENOUS | Status: DC
  Administered 2022-01-21 – 2022-01-25 (×9): 500 mg via INTRAVENOUS
  Filled 2022-01-21 (×8): qty 100

## 2022-01-21 MED ORDER — METFORMIN HCL 500 MG PO TABS
500.0000 mg | ORAL_TABLET | Freq: Two times a day (BID) | ORAL | Status: DC
  Administered 2022-01-22: 500 mg via ORAL
  Filled 2022-01-21 (×2): qty 1

## 2022-01-21 MED ORDER — HYDROCOD POLI-CHLORPHE POLI ER 10-8 MG/5ML PO SUER
5.0000 mL | Freq: Every evening | ORAL | Status: DC | PRN
  Administered 2022-01-21 – 2022-01-22 (×2): 5 mL via ORAL
  Filled 2022-01-21 (×2): qty 5

## 2022-01-21 MED ORDER — VANCOMYCIN HCL 2000 MG/400ML IV SOLN
2000.0000 mg | Freq: Once | INTRAVENOUS | Status: AC
  Administered 2022-01-21: 2000 mg via INTRAVENOUS
  Filled 2022-01-21: qty 400

## 2022-01-21 MED ORDER — HYDROXYZINE HCL 25 MG PO TABS
25.0000 mg | ORAL_TABLET | Freq: Four times a day (QID) | ORAL | Status: DC | PRN
Start: 2022-01-21 — End: 2022-01-27
  Administered 2022-01-21 – 2022-01-26 (×6): 25 mg via ORAL
  Filled 2022-01-21 (×6): qty 1

## 2022-01-21 MED ORDER — ACETAMINOPHEN 650 MG RE SUPP: 650.0000 mg | Freq: Four times a day (QID) | RECTAL | Status: DC | PRN

## 2022-01-21 MED ORDER — SODIUM CHLORIDE 0.9 % IV SOLN
2.0000 g | Freq: Two times a day (BID) | INTRAVENOUS | Status: DC
  Administered 2022-01-21 – 2022-01-25 (×8): 2 g via INTRAVENOUS
  Filled 2022-01-21 (×2): qty 12.5
  Filled 2022-01-21: qty 2
  Filled 2022-01-21: qty 12.5
  Filled 2022-01-21: qty 2
  Filled 2022-01-21 (×3): qty 12.5

## 2022-01-21 MED ORDER — ISOSORBIDE MONONITRATE ER 30 MG PO TB24
60.0000 mg | ORAL_TABLET | Freq: Every day | ORAL | Status: DC
  Administered 2022-01-22 – 2022-01-27 (×5): 60 mg via ORAL
  Filled 2022-01-21 (×5): qty 2

## 2022-01-21 MED ORDER — METRONIDAZOLE 500 MG/100ML IV SOLN
500.0000 mg | Freq: Once | INTRAVENOUS | Status: DC
  Filled 2022-01-21: qty 100

## 2022-01-21 MED ORDER — SODIUM CHLORIDE 0.9 % IV SOLN
2.0000 g | Freq: Once | INTRAVENOUS | Status: AC
  Administered 2022-01-21: 2 g via INTRAVENOUS
  Filled 2022-01-21: qty 12.5

## 2022-01-21 NOTE — ED Triage Notes (Signed)
Pt presents via EMS from [place] where pt presented altered.  Pt reportedly had positive covid test today.  Febrile at 101 at [facility].  EMS reports pt is only alert to self.

## 2022-01-21 NOTE — Assessment & Plan Note (Signed)
-   CPAP nightly ordered 

## 2022-01-21 NOTE — Assessment & Plan Note (Signed)
Suspect his leukopenia is due to methotrexate which has been stopped.  Lymphopenia may be from COVID infection.  For now, continue broad-spectrum antibiotics.  Blood cultures with no growth to date.  Hematology following.  Neutropenia improving.  Smear review unremarkable so far.

## 2022-01-21 NOTE — Consult Note (Signed)
Pharmacy Antibiotic Note  Daniel Kline is a 76 y.o. male admitted on 01/21/2022 with  altered mental status and fever(believed to be neutropenic sepsis) .  Patient had a home positive COVID test and reportedly had a temperature of 101 via [facility]. Pharmacy has been consulted for Vancomycin and Cefepime dosing. Patient also has Metronidazole ordered   Plan: Vancomycin 2g IV x 1 as loading dose, followed by 1250 mg IV Q 48 hrs. Goal AUC 400-550. Expected AUC: 457.1 Expected Cmin: 9.9 SCr used: 2.04, Vd used: 0.5  Cefepime 2g IV Q12 hours  Will continue to monitor renal function, and adjust dosing as appropriate.   Height: '5\' 5"'$  (165.1 cm) Weight: 104 kg (229 lb 4.5 oz) IBW/kg (Calculated) : 61.5  Temp (24hrs), Avg:100 F (37.8 C), Min:100 F (37.8 C), Max:100 F (37.8 C)  Recent Labs  Lab 01/21/22 1100  WBC 1.3*  CREATININE 2.04*    Estimated Creatinine Clearance: 33.7 mL/min (A) (by C-G formula based on SCr of 2.04 mg/dL (H)).    Allergies  Allergen Reactions   Bee Venom Anaphylaxis   Oxycodone Other (See Comments)    Delusions Delusions   Delusions   Hydromorphone Other (See Comments)    hallucinating   Zolpidem Other (See Comments)    Antimicrobials this admission: Vancomycin 6/15 >>  Cefepime 6/15 >>  Metronidazole 6/15 >>  Dose adjustments this admission: N/a  Microbiology results: 6/15 BCx: collected 6/15 MRSA PCR: ordered  Thank you for allowing pharmacy to be a part of this patient's care.  Pearla Dubonnet 01/21/2022 2:31 PM

## 2022-01-21 NOTE — Assessment & Plan Note (Addendum)
Continue Remdisivir.  Patient still having cough, but appears to be more stable.

## 2022-01-21 NOTE — Assessment & Plan Note (Signed)
-   Resumed home Norco 5-3 25, every 6 hours.  For moderate pain

## 2022-01-21 NOTE — Hospital Course (Signed)
Mr. Orie Baxendale is a 76 year old male with history of OSA, non-insulin-dependent diabetes mellitus, hypertension, hyperlipidemia, who presents emergency department from urgent care center for chief concerns of altered mental status.  Patient had a home positive test COVID, and reportedly had temperature of 101 via fast [facility].  Initially patient drove himself to urgent consider despite feeling generalized unwellness.  Initial vitals in the emergency department at [facility] showed temperature of 100, respiration rate of 25, heart rate 92, blood pressure 168/100, SPO2 of 97% on room air.  Serum sodium 135, potassium 4.0, chloride 101, bicarb 26, BUN of 15, serum creatinine of 2.04, GFR 33, nonfasting blood glucose 154, WBC 1.3, hemoglobin 11.8, platelets 416, absolute neutrophil count of 400.  Of note patient had a baseline WBC of 4.9-5.4 over the last year.  Procalcitonin was negative.  VBG 7.39/49/39  Blood cultures x2, procalcitonin PCR are in process.  Lactic acid x2 have been ordered and pending collection.  ED treatment: LR 1 L bolus, cefepime, metronidazole, vancomycin.

## 2022-01-21 NOTE — Assessment & Plan Note (Signed)
-   Rosuvastatin 20 mg daily resumed 

## 2022-01-21 NOTE — Progress Notes (Signed)
Received nursing message by RN, Ethelle Lyon that patient is confused and has pulled out his IV. She request for sitter at bedside.   I presented to bedside, and he is aao to self, age, location of hospital. He coughed twice. He does not appear to be in acute distress. He is mildly confused in terms of the month and year.   He denies chest pain, shortness of breath, abdominal pain, dysuria, diarrhea. He endorses sore throat.   Sitter is not indicated at this time give that patient is calm, and aao 3. He just states that he is tired and he knows he has covid.   Dr. Tobie Poet

## 2022-01-21 NOTE — Assessment & Plan Note (Signed)
At baseline 

## 2022-01-21 NOTE — Assessment & Plan Note (Signed)
-   Resumed home amlodipine 5 mg daily, Coreg 12.5 mg p.o. twice daily with meals, furosemide 40 mg twice daily, losartan 100 mg daily, Imdur 60 mg daily before lunch.  Monitor for hypotension.  Patient has had some episodes of bradycardia and Coreg decreased.  Bradycardia resolved

## 2022-01-21 NOTE — Assessment & Plan Note (Addendum)
-   This meets criteria for morbid obesity based on the presence of 1 or more chronic comorbidities. Patient has obstructive sleep apnea, diabetes mellitus with a BMI of 38. This complicates overall care and prognosis.

## 2022-01-21 NOTE — H&P (Signed)
History and Physical   Daniel Kline UXL:[id number] DOB: 1945-08-16 DOA: 01/21/2022  PCP: [name], MD  Patient coming from: urgent care via EMS  I have personally briefly reviewed patient's old medical records in [place].  Chief Concern: Altered mental status with fever  HPI: [name] is a 76 year old male with history of OSA, non-insulin-dependent diabetes mellitus, hypertension, hyperlipidemia, who presents emergency department from urgent care center for chief concerns of altered mental status.  Patient had a home positive test COVID, and reportedly had temperature of 101 via [facility].  Initially patient drove himself to urgent consider despite feeling generalized unwellness.  Initial vitals in the emergency department at [facility] showed temperature of 100, respiration rate of 25, heart rate 92, blood pressure 168/100, SPO2 of 97% on room air.  Serum sodium 135, potassium 4.0, chloride 101, bicarb 26, BUN of 15, serum creatinine of 2.04, GFR 33, nonfasting blood glucose 154, WBC 1.3, hemoglobin 11.8, platelets 416, absolute neutrophil count of 400.  Of note patient had a baseline WBC of 4.9-5.4 over the last year.  Procalcitonin was negative.  VBG 7.39/49/39  Blood cultures x2, procalcitonin PCR are in process.  Lactic acid x2 have been ordered and pending collection.  ED treatment: LR 1 L bolus, cefepime, metronidazole, vancomycin.  At bedside, he is able to tell me his name, age, location.   He reports that he has been having confusion since Sunday and cough for three days. He endorses subjective fever but they did not check temperature. He denies nausea, vomiting.   Spouse states he was looking for something, and wife states it's on the table and he argued with his wife, stating it's not there. Last night, he took all his cloths and went into the great room. He then states to his wife, that he was trying to put his cloths back on.   Social  history: He lives at home with his wife.  He denies tobacco, EtOH, recreational drug use.  He is retired and formerly worked as Higher education careers adviser.  Vaccination history: He is vaccinated for covid, no booster.  ROS: Constitutional: no weight change, no fever ENT/Mouth: no sore throat, no rhinorrhea Eyes: no eye pain, no vision changes Cardiovascular: no chest pain, no dyspnea,  no edema, no palpitations Respiratory: + cough, + sputum, no wheezing Gastrointestinal: no nausea, no vomiting, no diarrhea, no constipation Genitourinary: no urinary incontinence, no dysuria, no hematuria Musculoskeletal: no arthralgias, no myalgias Skin: no skin lesions, no pruritus, Neuro: + weakness, no loss of consciousness, no syncope Psych: no anxiety, no depression, + decrease appetite Heme/Lymph: no bruising, no bleeding  ED Course: Discussed with emergency medicine provider, patient requiring hospitalization for chief concerns of neutropenic sepsis.  Assessment/Plan  Principal Problem:   Neutropenic sepsis ([place]) Active Problems:   Essential hypertension   Stage 3b chronic kidney disease ([place])   Morbid obesity (HCC)   Hypercholesterolemia   OSA (obstructive sleep apnea)   Anti-cyclic citrullinated peptide antibody positive   GERD (gastroesophageal reflux disease)   Chronic pain   Secondary hyperparathyroidism of renal origin ([place])   COVID-19 virus infection   Assessment and Plan:  * Neutropenic sepsis ([place]) - Etiology work-up in progress - Patient is maintaining appropriate MAP - Blood cultures x2 are in process, SARS COVID PCR is in process; lactic acid collected and pending result - Procalcitonin was negative - Pathology smear review in process - UA with microscopy ordered and pending collection - Hematologist has been consulted, Dr.  Janese Kline who states that the patient will be seen by hematology/oncology service - Cefepime, vancomycin, metronidazole ordered - Admit to telemetry medical,  observation  COVID-19 virus infection - Present on admission - Contact and airborne precautions - Supportive measures: Guaifenesin and Tussionex nightly as needed for cough - Incentive spirometry, flutter valve  Chronic pain - Resumed home Norco 5-3 25, every 6 hours.  For moderate pain  OSA (obstructive sleep apnea) - CPAP nightly ordered  Hypercholesterolemia - Rosuvastatin 20 mg daily resumed  Morbid obesity ([place]) - This meets criteria for morbid obesity based on the presence of 1 or more chronic comorbidities. Patient has obstructive sleep apnea, diabetes mellitus with a BMI of 38. This complicates overall care and prognosis.   Stage 3b chronic kidney disease (HCC) - At baseline  Essential hypertension - Resumed home amlodipine 5 mg daily, Coreg 12.5 mg p.o. twice daily with meals, furosemide 40 mg twice daily, losartan 100 mg daily, Imdur 60 mg daily before lunch  Chart reviewed.   DVT prophylaxis: Enoxaparin 40 mg subcutaneous nightly Code Status: Full code Diet: Heart healthy/carb modified Family Communication: Updated spouse over the phone Disposition Plan: Pending clinical course Consults called: Hematology/oncology, Dr. 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 forearm near wrist. Severe atypia, close to peripheral margin.   Dysplastic nevus 06/21/2018   Upper back right paraspinal. Severe atypia, peripheral margin involved. Excised 07/11/2018, margins free.   Dysrhythmia    A FLUTTER   Family history of adverse reaction to anesthesia    PT WAS ADOPTED   HLD (hyperlipidemia)    HTN (hypertension)    HTN (hypertension)    Hx of dysplastic nevus 2019   multiple sites    Hx of squamous cell carcinoma 01/18/2018   R mid lateral forearm   MRSA (methicillin resistant Staphylococcus aureus)    after back surgery   OSA (obstructive sleep apnea)    USES BIPAP   Polio    POLIOMYELITIS 01/12/2010   Right arm affected   PONV (postoperative nausea and vomiting)    Squamous cell carcinoma of skin 12/19/2017   Right mid lat. forearm. SCCis, hypertrophic.   Past Surgical History:  Procedure Laterality Date   BACK SURGERY     LUMBAR   CARDIAC ELECTROPHYSIOLOGY STUDY AND ABLATION  2019   COLONOSCOPY WITH PROPOFOL N/A 04/04/2019   Procedure: COLONOSCOPY WITH PROPOFOL;  Surgeon: Toledo, Benay Pike, MD;  Location: [facility] ENDOSCOPY;  Service: Gastroenterology;  Laterality: N/A;   I & D EXTREMITY Right 02/01/2020   Procedure: IRRIGATION AND DEBRIDEMENT EXTREMITY with poly exchange;  Surgeon: Dereck Leep, MD;  Location: [facility] ORS;  Service: Orthopedics;  Laterality: Right;   INCISION AND DRAINAGE     BACK-MRSA INFECTION AFTER BACK SURGERY   KNEE ARTHROPLASTY Right 01/28/2020   Procedure: COMPUTER ASSISTED TOTAL KNEE ARTHROPLASTY;  Surgeon: Dereck Leep, MD;  Location: [facility] ORS;  Service: Orthopedics;  Laterality: Right;   MOUTH SURGERY     right elbow surgery     right knee surgery     right shoulder surgery     from polio damage   TONSILLECTOMY     TOTAL HIP  ARTHROPLASTY Bilateral 04/2016   Social History:  reports that he quit smoking about 31 years ago. His smoking use included cigars. He quit smokeless tobacco use about 15 years ago. He reports that he does not drink alcohol and does not use drugs.  Allergies  Allergen Reactions   Bee Venom Anaphylaxis   Oxycodone Other (See Comments)    Delusions Delusions   Delusions   Hydromorphone Other (See Comments)    hallucinating   Hydroxychloroquine     Other reaction(s): Other (See Comments) He broke out really badly.   Zolpidem Other (See Comments)   Family History  Adopted: Yes   Family history:  Family history reviewed and patient was adopted.  Prior to Admission medications   Medication Sig Start Date End Date Taking? Authorizing Provider  amLODipine (NORVASC) 5 MG tablet Take 1 tablet (5 mg total) by mouth daily. 09/21/21   [name], MD  carvedilol (COREG) 12.5 MG tablet TAKE ONE (1) TABLET BY MOUTH TWO TIMES PER DAY 05/11/21   Minna Merritts, MD  celecoxib (CELEBREX) 200 MG capsule TAKE 1 CAPSULE BY MOUTH EVERY DAY 06/13/20   [name], MD  Cholecalciferol (VITAMIN D-3 PO) Take 10,000 Units by mouth daily.     [provider]  clobetasol cream (TEMOVATE) 8.52 % Apply 1 application topically 2 (two) times daily as needed (irritation).  06/27/15   [provider]  clorazepate (TRANXENE) 7.5 MG tablet Take 1 tablet (7.5 mg total) by mouth 2 (two) times daily as needed for anxiety. 12/24/21   [name], MD  diclofenac Sodium (VOLTAREN) 1 % GEL APPLY 2 GRAMS TOPICALLY FOUR TIMES DAILY 11/05/21   [name], MD  Elastic Bandages & Supports (MEDICAL COMPRESSION STOCKINGS) MISC 2 Units by Does not apply route daily. 08/19/17   Merlyn Lot, MD  EPINEPHrine 0.3 mg/0.3 mL IJ SOAJ injection Inject 0.3 mLs (0.3 mg total) into the muscle as needed for anaphylaxis. 11/22/19   [name], MD  Ferrous Sulfate (IRON PO) Take 1 tablet by mouth at bedtime.    [provider]  fluconazole (DIFLUCAN) 200 MG tablet Take one tab po twice per week for 3 weeks. 10/06/21   Ralene Bathe, MD  folic acid (FOLVITE) 1 MG tablet Take 1 mg by mouth daily. 06/11/20   [provider]  furosemide (LASIX) 40 MG tablet Take 1 tablet (40 mg total) by mouth 2 (two) times daily. 05/11/21   Minna Merritts, MD  gabapentin (NEURONTIN) 300 MG capsule TAKE 1 CAPSULE BY MOUTH 3  TIMES DAILY 09/08/21   Viviana Simpler I, MD  glipiZIDE (GLUCOTROL) 5 MG tablet TAKE ONE-HALF TABLET BY  MOUTH TWICE DAILY BEFORE  MEALS 12/02/21   [name], MD   HYDROcodone-acetaminophen (NORCO/VICODIN) 5-325 MG tablet TAKE 1 TABLET BY MOUTH EVERY SIX HOURS AS NEEDED FOR PAIN 12/23/21   [name], MD  hydrOXYzine (ATARAX) 25 MG tablet TAKE ONE TABLET EVERY EIGHT HOURS AS NEEDED 11/25/21   [name], MD  isosorbide mononitrate (IMDUR) 60 MG 24 hr tablet Take 1 tablet (60 mg total) by mouth daily before lunch. 05/11/21   Minna Merritts, MD  ketoconazole (NIZORAL) 2 % cream Apply twice daily to under arm and feet until clear then one additional week 09/10/21   Moye, [place], MD  liraglutide (VICTOZA) 18 MG/3ML SOPN Inject 1.2 mg into the skin daily. 12/24/21   [name], MD  losartan (COZAAR) 100 MG  tablet Take 1 tablet (100 mg total) by mouth daily. 05/11/21   Minna Merritts, MD  MAGNESIUM PO Take by mouth.    [provider]  metFORMIN (GLUCOPHAGE) 500 MG tablet TAKE 1 TABLET BY MOUTH TWICE DAILY 07/08/21   Viviana Simpler I, MD  methotrexate 2.5 MG tablet Take 6 tablets by mouth once a week. 06/17/21   [provider]  Multiple Vitamins-Minerals (MULTIVITAL) tablet Take 1 tablet by mouth daily.    [provider]  mupirocin ointment (BACTROBAN) 2 % APPLY TO CRUSTS ON THE LEFT LOWER LEG AND LEFT AXILLA (UNDER ARM) EVERY DAY 12/23/21   Ralene Bathe, MD  [facility] ULTRA test strip CHECK BLOOD SUGAR 3 TIMES DAILY AS DIRECTED 12/02/20   Crecencio Mc, MD  rosuvastatin (CRESTOR) 20 MG tablet TAKE 1 TABLET BY MOUTH  DAILY 02/05/21   Crecencio Mc, MD  timolol (TIMOPTIC) 0.5 % ophthalmic solution Apply 1 drop to eye 2 (two) times daily. 01/11/20   [provider]  triamcinolone cream (KENALOG) 0.1 % Apply to aa L axilla and L lower leg QD PRN. 10/06/21   Ralene Bathe, MD   Physical Exam: Vitals:   01/21/22 1230 01/21/22 1300 01/21/22 1640 01/21/22 1643  BP: (!) 154/79 (!) 146/83 (!) 165/78   Pulse: 74 74 85   Resp:  (!) 23 (!) 22   Temp:   99.6 F (37.6 C)   TempSrc:      SpO2: 96% 95% 95%    Weight:  104 kg  104.9 kg  Height:  '5\' 5"'$  (1.651 m)  '5\' 5"'$  (1.651 m)   Constitutional: appears age-appropriate, NAD, calm, comfortable Eyes: PERRL, lids and conjunctivae normal ENMT: Mucous membranes are moist. Posterior pharynx clear of any exudate or lesions. Age-appropriate dentition. Hearing appropriate Neck: normal, supple, no masses, no thyromegaly Respiratory: clear to auscultation bilaterally, no wheezing, no crackles. Normal respiratory effort. No accessory muscle use.  Cardiovascular: Regular rate and rhythm, no murmurs / rubs / gallops. No extremity edema. 2+ pedal pulses. No carotid bruits.  Abdomen: no tenderness, no masses palpated, no hepatosplenomegaly. Bowel sounds positive.  Musculoskeletal: no clubbing / cyanosis. No joint deformity upper and lower extremities. Good ROM, no contractures, no atrophy. Normal muscle tone.  Skin: no rashes, lesions, ulcers. No induration Neurologic: Sensation intact. Strength 5/5 in all 4.  Psychiatric: Currently lacks judgment and insight. Alert and oriented x self, age, location of hospital.  Patient was not able to tell me the current calendar year or the current month.  Pleasant mood.   EKG: independently reviewed, showing atrial fibrillation with rate of 83, QTc 435  Chest x-ray on Admission: I personally reviewed and I agree with radiologist reading as below.  DG Chest 2 View  Result Date: 01/21/2022 CLINICAL DATA:  SOB EXAM: CHEST - 2 VIEW COMPARISON:  Chest radiograph 08/15/2017. FINDINGS: Similar enlargement the cardiac silhouette. No consolidation. No visible pleural effusions or pneumothorax. No acute osseous abnormality. IMPRESSION: Similar cardiomegaly without evidence of acute cardiopulmonary disease. Electronically Signed   By: Margaretha Sheffield M.D.   On: 01/21/2022 11:43   CT Head Wo Contrast  Result Date: 01/21/2022 CLINICAL DATA:  76 year old male with history of mental status change. EXAM: CT HEAD WITHOUT CONTRAST  TECHNIQUE: Contiguous axial images were obtained from the base of the skull through the vertex without intravenous contrast. RADIATION DOSE REDUCTION: This exam was performed according to the departmental dose-optimization program which includes automated exposure control, adjustment of the mA  and/or kV according to patient size and/or use of iterative reconstruction technique. COMPARISON:  No priors. FINDINGS: Brain: Mild cerebral atrophy. Patchy and confluent areas of decreased attenuation are noted throughout the deep and periventricular white matter of the cerebral hemispheres bilaterally, compatible with chronic microvascular ischemic disease. More well-defined area of low attenuation in the right occipital lobe, presumably an area of mild encephalomalacia related to remote right PCA territory infarct. No evidence of acute infarction, hemorrhage, hydrocephalus, extra-axial collection or mass lesion/mass effect. Vascular: No hyperdense vessel or unexpected calcification. Skull: Normal. Negative for fracture or focal lesion. Sinuses/Orbits: No acute finding. Other: None. IMPRESSION: 1. No acute intracranial abnormalities. 2. 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 Electronically Signed   By: Vinnie Langton M.D.   On: 01/21/2022 11:29    Labs on Admission: I have personally reviewed following labs  CBC: Recent Labs  Lab 01/21/22 1100  WBC 1.3*  NEUTROABS 0.4*  HGB 11.1*  HCT 34.1*  MCV 95.0  PLT 726*   Basic Metabolic Panel: Recent Labs  Lab 01/21/22 1100  NA 135  K 4.0  CL 101  CO2 26  GLUCOSE 154*  BUN 15  CREATININE 2.04*  CALCIUM 9.2   GFR: Estimated Creatinine Clearance: 33.8 mL/min (A) (by C-G formula based on SCr of 2.04 mg/dL (H)).  Liver Function Tests: Recent Labs  Lab 01/21/22 1100  AST 25  ALT 17  ALKPHOS 55  BILITOT 0.9  PROT  7.2  ALBUMIN 4.0   Urine analysis:    Component Value Date/Time   COLORURINE STRAW (A) 01/21/2022 1343   APPEARANCEUR CLEAR (A) 01/21/2022 1343   APPEARANCEUR Clear 10/18/2011 0958   LABSPEC 1.008 01/21/2022 1343   LABSPEC 1.021 10/18/2011 0958   PHURINE 8.0 01/21/2022 1343   GLUCOSEU 50 (A) 01/21/2022 1343   GLUCOSEU Negative 10/18/2011 0958   HGBUR SMALL (A) 01/21/2022 1343   BILIRUBINUR NEGATIVE 01/21/2022 1343   BILIRUBINUR Negative 10/18/2011 [place] 01/21/2022 1343   PROTEINUR 30 (A) 01/21/2022 1343   NITRITE NEGATIVE 01/21/2022 1343   LEUKOCYTESUR NEGATIVE 01/21/2022 1343   LEUKOCYTESUR Negative 10/18/2011 0958   Dr. Tobie Poet [facility]  If 7PM-7AM, please contact overnight-coverage provider If 7AM-7PM, please contact day coverage provider [email or web address]  01/21/2022, 6:32 PM

## 2022-01-21 NOTE — ED Provider Notes (Signed)
[facility] Provider Note    Event Date/Time   First MD Initiated Contact with Patient 01/21/22 1056     (approximate)   History   Chief Complaint Altered Mental Status   HPI  Daniel Kline is a 76 y.o. male with past medical history of hypertension, hyperlipidemia, diabetes, anemia, and CKD who presents to the ED for altered mental status.  Per EMS, patient had [facility] for evaluation of cough, malaise, and body aches for about 1 week.  While there, patient was noted to be disoriented and confused, subsequently tested positive for COVID-19.  EMS was called to urgent care and patient taken to the ED for further evaluation.  Patient denies feeling short of breath or having any chest pain, has not had any nausea, vomiting, or diarrhea.  He is not aware of any fevers but was found to be febrile to 101 at urgent care.  He has not been vaccinated for COVID-19.     Physical Exam   Triage Vital Signs: ED Triage Vitals [01/21/22 1054]  Enc Vitals Group     BP      Pulse      Resp      Temp      Temp src      SpO2      Weight      Height      Head Circumference      Peak Flow      Pain Score 0     Pain Loc      Pain Edu?      Excl. in [place]?     Most recent vital signs: Vitals:   01/21/22 1230 01/21/22 1300  BP: (!) 154/79 (!) 146/83  Pulse: 74 74  Resp:  (!) 23  Temp:    SpO2: 96% 95%    Constitutional: Alert and oriented to person and place, but not time or situation. Eyes: Conjunctivae are normal. Head: Atraumatic. Nose: No congestion/rhinnorhea. Mouth/Throat: Mucous membranes are moist.  Cardiovascular: Normal rate, regular rhythm. Grossly normal heart sounds.  2+ radial pulses bilaterally. Respiratory: Normal respiratory effort.  No retractions. Lungs CTAB. Gastrointestinal: Soft and nontender. No distention. Musculoskeletal: No lower extremity tenderness nor edema.  Neurologic:  Normal speech  and language. No gross focal neurologic deficits are appreciated.    ED Results / Procedures / Treatments   Labs (all labs ordered are listed, but only abnormal results are displayed) Labs Reviewed  CBC WITH DIFFERENTIAL/PLATELET - Abnormal; Notable for the following components:      Result Value   WBC 1.3 (*)    RBC 3.59 (*)    Hemoglobin 11.1 (*)    HCT 34.1 (*)    RDW 17.1 (*)    Platelets 416 (*)    nRBC 1.6 (*)    Neutro Abs 0.4 (*)    Lymphs Abs 0.4 (*)    All other components within normal limits  COMPREHENSIVE METABOLIC PANEL - Abnormal; Notable for the following components:   Glucose, Bld 154 (*)    Creatinine, Ser 2.04 (*)    GFR, Estimated 33 (*)    All other components within normal limits  BLOOD GAS, VENOUS - Abnormal; Notable for the following components:   Bicarbonate 29.7 (*)    Acid-Base Excess 3.8 (*)    All other components within normal limits  TROPONIN I (HIGH SENSITIVITY) - Abnormal; Notable for the following components:   Troponin I (High  Sensitivity) 18 (*)    All other components within normal limits  SARS CORONAVIRUS 2 BY RT PCR  CULTURE, BLOOD (ROUTINE X 2)  CULTURE, BLOOD (ROUTINE X 2)  BRAIN NATRIURETIC PEPTIDE  PROCALCITONIN  PATHOLOGIST SMEAR REVIEW  LACTIC ACID, PLASMA  LACTIC ACID, PLASMA  TROPONIN I (HIGH SENSITIVITY)     EKG  ED ECG REPORT I, Blake Divine, the attending physician, personally viewed and interpreted this ECG.   Date: 01/21/2022  EKG Time: 11:06  Rate: 83  Rhythm: normal sinus rhythm  Axis: Normal  Intervals:none  ST&T Change: None  RADIOLOGY Chest x-ray reviewed and interpreted by me with no infiltrate, edema, or effusion.  PROCEDURES:  Critical Care performed: No  Procedures   MEDICATIONS ORDERED IN ED: Medications  ceFEPIme (MAXIPIME) 2 g in sodium chloride 0.9 % 100 mL IVPB (has no administration in time range)  metroNIDAZOLE (FLAGYL) IVPB 500 mg (has no administration in time range)   vancomycin (VANCOCIN) IVPB 1000 mg/200 mL premix (has no administration in time range)  lactated ringers bolus 1,000 mL (1,000 mLs Intravenous New Bag/Given 01/21/22 1256)     IMPRESSION / MDM / ASSESSMENT AND PLAN / ED COURSE  I reviewed the triage vital signs and the nursing notes.                              76 y.o. male with past medical history of hypertension, hyperlipidemia, anemia, diabetes, and CKD who presents to the ED for altered mental status after testing positive for COVID-19 at urgent care earlier today.  Patient's presentation is most consistent with acute presentation with potential threat to life or bodily function.  Differential diagnosis includes, but is not limited to, sepsis, pneumonia, hypercapnic respiratory failure, intracranial process, stroke, dehydration, electrolyte abnormality.  Patient nontoxic-appearing and in no acute distress, appears to be breathing comfortably on room air with normal oxygen saturation.  We will check VBG for hypercapnia to explain his change in mental status, also check CT head but he has no focal neurologic deficits concerning for stroke.  Plan to screen labs including troponin and electrolytes, also check chest x-ray for developing pneumonia.  VBG is reassuring with no hypercapnia to explain his altered mental status, CT head is also unremarkable.  Chest x-ray shows no signs of pneumonia, COVID PCR here at [facility] is pending but patient confirmed to be positive on rapid test at urgent care.  Labs unremarkable for neutropenia with no apparent history of this in patient's chart and no recent chemotherapy.  Case discussed with Dr. Janese Banks, who agrees with plan for treatment for febrile neutropenia with broad spectrum antibiotics and will see the patient in consultation.  Remainder of labs are reassuring with stable renal function and no significant electrolyte abnormality, troponin mildly elevated but likely secondary to patient's chronic kidney  disease.  Case discussed with hospitalist for admission.      FINAL CLINICAL IMPRESSION(S) / ED DIAGNOSES   Final diagnoses:  Febrile neutropenia ([place])  Altered mental status, unspecified altered mental status type  COVID-19     Rx / DC Orders   ED Discharge Orders     None        Note:  This document was prepared using Dragon voice recognition software and may include unintentional dictation errors.   Blake Divine, MD 01/21/22 1309

## 2022-01-21 NOTE — Consult Note (Signed)
PHARMACY -  BRIEF ANTIBIOTIC NOTE   Pharmacy has received consult(s) for Vancomycin and Cefepime from an ED provider.  The patient's profile has been reviewed for ht/wt/allergies/indication/available labs.    One time order(s) placed for Vancomycin 1g IV and Cefepime 2g IV x 1 dose each.   Further antibiotics/pharmacy consults should be ordered by admitting physician if indicated.                       Thank you, Pearla Dubonnet 01/21/2022  12:38 PM

## 2022-01-21 NOTE — Consult Note (Signed)
CODE SEPSIS - PHARMACY COMMUNICATION  **Broad Spectrum Antibiotics should be administered within 1 hour of Sepsis diagnosis**  Time Code Sepsis Called/Page Received: 1242  Antibiotics Ordered: Cefepime, Vancomycin, Flagyl  Time of 1st antibiotic administration: 132  Additional action taken by pharmacy: none  If necessary, Name of Provider/Nurse Contacted: n/a    Pearla Dubonnet ,PharmD Clinical Pharmacist  01/21/2022  1:41 PM

## 2022-01-21 NOTE — Telephone Encounter (Signed)
Last office visit 12/24/21 for 3 month follow up.  Last refilled 12/23/2021 for #60 with no refills.   Next Appt: 03/29/22 for 3 month follow up.  UDS/Contract: 12/24/21.  Looks like patient is currenlty in the ED.

## 2022-01-22 ENCOUNTER — Observation Stay: Payer: Medicare Other

## 2022-01-22 DIAGNOSIS — I1310 Hypertensive heart and chronic kidney disease without heart failure, with stage 1 through stage 4 chronic kidney disease, or unspecified chronic kidney disease: Secondary | ICD-10-CM | POA: Diagnosis present

## 2022-01-22 DIAGNOSIS — R768 Other specified abnormal immunological findings in serum: Secondary | ICD-10-CM | POA: Diagnosis not present

## 2022-01-22 DIAGNOSIS — A4189 Other specified sepsis: Secondary | ICD-10-CM | POA: Diagnosis present

## 2022-01-22 DIAGNOSIS — E7800 Pure hypercholesterolemia, unspecified: Secondary | ICD-10-CM

## 2022-01-22 DIAGNOSIS — G8929 Other chronic pain: Secondary | ICD-10-CM | POA: Diagnosis present

## 2022-01-22 DIAGNOSIS — N1832 Chronic kidney disease, stage 3b: Secondary | ICD-10-CM

## 2022-01-22 DIAGNOSIS — N189 Chronic kidney disease, unspecified: Secondary | ICD-10-CM | POA: Diagnosis not present

## 2022-01-22 DIAGNOSIS — G894 Chronic pain syndrome: Secondary | ICD-10-CM | POA: Diagnosis not present

## 2022-01-22 DIAGNOSIS — N2581 Secondary hyperparathyroidism of renal origin: Secondary | ICD-10-CM | POA: Diagnosis not present

## 2022-01-22 DIAGNOSIS — Z28310 Unvaccinated for covid-19: Secondary | ICD-10-CM | POA: Diagnosis not present

## 2022-01-22 DIAGNOSIS — R059 Cough, unspecified: Secondary | ICD-10-CM | POA: Diagnosis not present

## 2022-01-22 DIAGNOSIS — G4733 Obstructive sleep apnea (adult) (pediatric): Secondary | ICD-10-CM | POA: Diagnosis not present

## 2022-01-22 DIAGNOSIS — K219 Gastro-esophageal reflux disease without esophagitis: Secondary | ICD-10-CM | POA: Diagnosis present

## 2022-01-22 DIAGNOSIS — R4182 Altered mental status, unspecified: Secondary | ICD-10-CM | POA: Diagnosis not present

## 2022-01-22 DIAGNOSIS — K5900 Constipation, unspecified: Secondary | ICD-10-CM | POA: Diagnosis not present

## 2022-01-22 DIAGNOSIS — E1121 Type 2 diabetes mellitus with diabetic nephropathy: Secondary | ICD-10-CM | POA: Diagnosis present

## 2022-01-22 DIAGNOSIS — E1122 Type 2 diabetes mellitus with diabetic chronic kidney disease: Secondary | ICD-10-CM

## 2022-01-22 DIAGNOSIS — I10 Essential (primary) hypertension: Secondary | ICD-10-CM | POA: Diagnosis not present

## 2022-01-22 DIAGNOSIS — Z79899 Other long term (current) drug therapy: Secondary | ICD-10-CM

## 2022-01-22 DIAGNOSIS — D709 Neutropenia, unspecified: Secondary | ICD-10-CM | POA: Diagnosis not present

## 2022-01-22 DIAGNOSIS — Z96643 Presence of artificial hip joint, bilateral: Secondary | ICD-10-CM | POA: Diagnosis present

## 2022-01-22 DIAGNOSIS — Z85828 Personal history of other malignant neoplasm of skin: Secondary | ICD-10-CM | POA: Diagnosis not present

## 2022-01-22 DIAGNOSIS — Z7984 Long term (current) use of oral hypoglycemic drugs: Secondary | ICD-10-CM | POA: Diagnosis not present

## 2022-01-22 DIAGNOSIS — D849 Immunodeficiency, unspecified: Secondary | ICD-10-CM | POA: Diagnosis present

## 2022-01-22 DIAGNOSIS — Z6838 Body mass index (BMI) 38.0-38.9, adult: Secondary | ICD-10-CM | POA: Diagnosis not present

## 2022-01-22 DIAGNOSIS — I480 Paroxysmal atrial fibrillation: Secondary | ICD-10-CM | POA: Diagnosis not present

## 2022-01-22 DIAGNOSIS — I129 Hypertensive chronic kidney disease with stage 1 through stage 4 chronic kidney disease, or unspecified chronic kidney disease: Secondary | ICD-10-CM | POA: Diagnosis not present

## 2022-01-22 DIAGNOSIS — M069 Rheumatoid arthritis, unspecified: Secondary | ICD-10-CM

## 2022-01-22 DIAGNOSIS — A419 Sepsis, unspecified organism: Secondary | ICD-10-CM | POA: Diagnosis not present

## 2022-01-22 DIAGNOSIS — D631 Anemia in chronic kidney disease: Secondary | ICD-10-CM | POA: Diagnosis present

## 2022-01-22 DIAGNOSIS — Z87891 Personal history of nicotine dependence: Secondary | ICD-10-CM

## 2022-01-22 DIAGNOSIS — U071 COVID-19: Secondary | ICD-10-CM | POA: Diagnosis not present

## 2022-01-22 DIAGNOSIS — G9341 Metabolic encephalopathy: Secondary | ICD-10-CM | POA: Diagnosis present

## 2022-01-22 LAB — BASIC METABOLIC PANEL
Anion gap: 6 (ref 5–15)
BUN: 13 mg/dL (ref 8–23)
CO2: 23 mmol/L (ref 22–32)
Calcium: 7.4 mg/dL — ABNORMAL LOW (ref 8.9–10.3)
Chloride: 108 mmol/L (ref 98–111)
Creatinine, Ser: 1.55 mg/dL — ABNORMAL HIGH (ref 0.61–1.24)
GFR, Estimated: 46 mL/min — ABNORMAL LOW (ref >60)
Glucose, Bld: 103 mg/dL — ABNORMAL HIGH (ref 70–99)
Potassium: 3.9 mmol/L (ref 3.5–5.1)
Sodium: 137 mmol/L (ref 135–145)

## 2022-01-22 LAB — CBC WITH DIFFERENTIAL/PLATELET
Band Neutrophils: 6 %
Basophils Relative: 2 %
Eosinophils Relative: 1 %
HCT: 32.3 % — ABNORMAL LOW (ref 39.0–52.0)
Hemoglobin: 10.5 g/dL — ABNORMAL LOW (ref 13.0–17.0)
Lymphocytes Relative: 42 %
MCH: 30.9 pg (ref 26.0–34.0)
MCHC: 32.5 g/dL (ref 30.0–36.0)
MCV: 95 fL (ref 80.0–100.0)
Monocytes Relative: 24 %
Neutrophils Relative %: 25 %
Platelets: 435 10*3/uL — ABNORMAL HIGH (ref 150–400)
RBC: 3.4 MIL/uL — ABNORMAL LOW (ref 4.22–5.81)
RDW: 16.8 % — ABNORMAL HIGH (ref 11.5–15.5)
WBC: 1.7 10*3/uL — ABNORMAL LOW (ref 4.0–10.5)
nRBC: 1.2 % — ABNORMAL HIGH (ref 0.0–0.2)

## 2022-01-22 LAB — C-REACTIVE PROTEIN: CRP: 2 mg/dL — ABNORMAL HIGH (ref <1.0)

## 2022-01-22 LAB — TECHNOLOGIST SMEAR REVIEW

## 2022-01-22 LAB — CORTISOL-AM, BLOOD: Cortisol - AM: 15.3 ug/dL (ref 6.7–22.6)

## 2022-01-22 LAB — VITAMIN B12: Vitamin B-12: 598 pg/mL (ref 180–914)

## 2022-01-22 LAB — PROTIME-INR
INR: 1.2 (ref 0.8–1.2)
Prothrombin Time: 14.8 seconds (ref 11.4–15.2)

## 2022-01-22 LAB — HEPATITIS PANEL, ACUTE
HCV Ab: NONREACTIVE
Hep A IgM: NONREACTIVE
Hep B C IgM: NONREACTIVE
Hepatitis B Surface Ag: NONREACTIVE

## 2022-01-22 LAB — FOLATE: Folate: 34 ng/mL (ref >5.9)

## 2022-01-22 LAB — HIV ANTIBODY (ROUTINE TESTING W REFLEX): HIV Screen 4th Generation wRfx: NONREACTIVE

## 2022-01-22 MED ORDER — FOLIC ACID 1 MG PO TABS: 1.0000 mg | ORAL_TABLET | Freq: Every day | ORAL | Status: DC

## 2022-01-22 MED ORDER — VANCOMYCIN HCL 750 MG/150ML IV SOLN
750.0000 mg | INTRAVENOUS | Status: DC
  Administered 2022-01-22 – 2022-01-24 (×3): 750 mg via INTRAVENOUS
  Filled 2022-01-22 (×4): qty 150

## 2022-01-22 MED ORDER — TIMOLOL MALEATE 0.5 % OP SOLN
1.0000 [drp] | Freq: Two times a day (BID) | OPHTHALMIC | Status: DC
  Administered 2022-01-23 – 2022-01-27 (×10): 1 [drp] via OPHTHALMIC
  Filled 2022-01-22: qty 5

## 2022-01-22 MED ORDER — SODIUM CHLORIDE 0.9 % IV SOLN
100.0000 mg | Freq: Every day | INTRAVENOUS | Status: AC
  Administered 2022-01-23 – 2022-01-24 (×2): 100 mg via INTRAVENOUS
  Filled 2022-01-22: qty 100
  Filled 2022-01-22: qty 20

## 2022-01-22 MED ORDER — SODIUM CHLORIDE 0.9 % IV SOLN: INTRAVENOUS | Status: DC | PRN

## 2022-01-22 MED ORDER — SODIUM CHLORIDE 0.9 % IV SOLN
200.0000 mg | Freq: Once | INTRAVENOUS | Status: AC
  Administered 2022-01-22: 200 mg via INTRAVENOUS
  Filled 2022-01-22: qty 40

## 2022-01-22 NOTE — Progress Notes (Signed)
RRT offered CPAP machine for sleep and patient refused. Machine not in room.

## 2022-01-22 NOTE — Consult Note (Signed)
Pharmacy Antibiotic Note  Daniel Kline is a 76 y.o. male w/ PMH of DM, HTN, HLD, anemia, CKD, Cutaneous T-cell lymphoma, OSA, RA, glaucoma admitted on 01/21/2022 with  altered mental status and fever(believed to be neutropenic sepsis) .  Patient had a home positive COVID test and reportedly had a temperature of 101 via [facility]. Pharmacy has been consulted for vancomycin and cefepime dosing. Patient also has metronidazole ordered. Since admission his renal function has improved (based on serum creatinine).   Plan:  1) adjust vancomycin to 750 mg IV every 24 hours Goal AUC 400-550. Expected AUC: 430.5 Expected Cmin: 12.1 mcg/mL SCr used: 1.55 mg/dL Ke: 0.033 h-1, T1/2 20.9 h Daily serum creatinine while on IV vancomycin  2) continue cefepime 2 grams IV every 12 hours  Will continue to monitor renal function, and adjust dosing as appropriate.   Height: '5\' 5"'$  (165.1 cm) Weight: 104.9 kg (231 lb 4.2 oz) IBW/kg (Calculated) : 61.5  Temp (24hrs), Avg:99.4 F (37.4 C), Min:98.8 F (37.1 C), Max:100 F (37.8 C)  Recent Labs  Lab 01/21/22 1100 01/21/22 1754 01/21/22 2047 01/22/22 0459  WBC 1.3*  --   --  1.7*  CREATININE 2.04*  --   --  1.55*  LATICACIDVEN  --  1.6 1.1  --      Estimated Creatinine Clearance: 44.5 mL/min (A) (by C-G formula based on SCr of 1.55 mg/dL (H)).    Allergies  Allergen Reactions   Bee Venom Anaphylaxis   Oxycodone Other (See Comments)    Delusions Delusions   Delusions   Hydromorphone Other (See Comments)    hallucinating   Hydroxychloroquine     Other reaction(s): Other (See Comments) He broke out really badly.   Zolpidem Other (See Comments)    Antimicrobials this admission: vancomycin 6/15 >>  cefepime 6/15 >>  metronidazole 6/15 >>  Microbiology results: 6/15 BCx: NG < 24h 6/15 MRSA PCR: negative 6/15 SARS CoV-2: positive  Thank you for allowing pharmacy to be a part of this patient's care.  Dallie Piles 01/22/2022 8:18  AM

## 2022-01-22 NOTE — Progress Notes (Addendum)
Progress Note  Patient: Daniel Kline ERX:[id number] DOB: 12-22-1945  DOA: 01/21/2022  DOS: 01/22/2022    Brief hospital course: Daniel Kline is a 76 y.o. male with a history of NIDT2DM, HTN, HLD, OSA, RA on MTX, atrial flutter s/p ablation not on anticoagulation, obesity who presented to the ED by way of urgent care where he had driven to be evaluated for sore throat in the setting of positive home testing for covid-19. He appeared confused, so was referred to the ED where he was febrile with WBC 1.3k. PCT undetectable. SARS-CoV-2 Ag test confirmed positivity. Broad spectrum antibiotics were started for neutropenic sepsis and the patient was admitted. Remdesivir is added and the patient remains mildly encephalopathic without respiratory distress.  Assessment and Plan: Neutropenic sepsis: Admitting diagnosis. Suspect leukopenia is due to methotrexate which we will stop. Lymphopenia also likely attributable to covid-19 infection. - Will continue broad spectrum IV antibiotics (Vancomycin, cefepime, flagyl) empirically for now. Defer to hematology whether neupogen would be appropriate.  - Monitor blood cultures  COVID-19 virus infection: Of note with sore throat, group A strep and influenza were checked and negative.  - Check CRP - Since encephalopathy likely attributable to this and patient is immunosuppressed, will plan to give remdesivir.  - Contact/airborne precautions.  - Supportive care for sore throat/cough.  Acute encephalopathy: CT head negative, exam nonfocal. Overall mild.  - Delirium precautions - Monitor with treatment to guide whether further work up would be needed.  Sinus rhythm with PACs and wandering atrial pacemaker: Noted on ECG and telemetry thus far.  - Curbsided cardiology, Dr. Fletcher Anon. Cardiology does not see AFib, though pt has hx atrial flutter. Will not change medications at this time. Continue telemetry.  Rheumatoid arthritis: Also in chart there is mention of a  history of leukopenia due to methotrexate, though he has been taking this every Thursday for a while. - Hold MTX. Folate level sufficient.  Abdominal distention:  - R/o obstruction/ileus with KUB.   Chronic pain - Resumed home Norco 5-3 25, every 6 hours.  For moderate pain  OSA (obstructive sleep apnea) - CPAP nightly ordered  Hypercholesterolemia - Rosuvastatin 20 mg daily resumed  Morbid obesity ([place]) - This meets criteria for morbid obesity based on the presence of 1 or more chronic comorbidities. Patient has obstructive sleep apnea, diabetes mellitus with a BMI of 38. This complicates overall care and prognosis.   Stage 3b chronic kidney disease (HCC) - At baseline  Essential hypertension - Resumed home amlodipine 5 mg daily, Coreg 12.5 mg p.o. twice daily with meals, furosemide 40 mg twice daily, losartan 100 mg daily, Imdur 60 mg daily before lunch  Subjective: Pt's only complaint is sore throat, also coughs during encounter but didn't say he was coughing. No shortness of breath or chest pain. No palpitations.   Objective: Vitals:   01/21/22 1934 01/22/22 0804 01/22/22 0808 01/22/22 1118  BP: (!) 146/93 (!) 150/103  (!) 141/85  Pulse: 87 (!) 58 80 (!) 52  Resp: 18 (!) 24  20  Temp: 98.8 F (37.1 C) 99.3 F (37.4 C)  98.5 F (36.9 C)  TempSrc:      SpO2: 94% 94%  94%  Weight:      Height:       Gen: Pleasant, obese 76 y.o. male in no distress Pulm: Nonlabored breathing room air. Clear CV: Regular with frequent premature beats without murmur, rub, or gallop. No JVD, no pitting dependent edema. GI: Abdomen protuberant with active bowel  sounds, nontender.  Ext: Warm, no deformities Skin: No rashes, lesions or ulcers on visualized skin. Neuro: Alert. Oriented to state, thought we were in [place], unable to name the hospital in [place], [place], remembers his name and DOB, cannot recollect details leading to admission. No focal neurological deficits. Psych: Judgement  and insight appear impaired by short term recall deficits.  Data Personally reviewed: CBC: Recent Labs  Lab 01/21/22 1100 01/22/22 0459  WBC 1.3* 1.7*  NEUTROABS 0.4*  --   HGB 11.1* 10.5*  HCT 34.1* 32.3*  MCV 95.0 95.0  PLT 416* 161*   Basic Metabolic Panel: Recent Labs  Lab 01/21/22 1100 01/22/22 0459  NA 135 137  K 4.0 3.9  CL 101 108  CO2 26 23  GLUCOSE 154* 103*  BUN 15 13  CREATININE 2.04* 1.55*  CALCIUM 9.2 7.4*   GFR: Estimated Creatinine Clearance: 44.5 mL/min (A) (by C-G formula based on SCr of 1.55 mg/dL (H)). Liver Function Tests: Recent Labs  Lab 01/21/22 1100  AST 25  ALT 17  ALKPHOS 55  BILITOT 0.9  PROT 7.2  ALBUMIN 4.0   No results for input(s): "LIPASE", "AMYLASE" in the last 168 hours. No results for input(s): "AMMONIA" in the last 168 hours. Coagulation Profile: Recent Labs  Lab 01/22/22 0459  INR 1.2   Cardiac Enzymes: No results for input(s): "CKTOTAL", "CKMB", "CKMBINDEX", "TROPONINI" in the last 168 hours. BNP (last 3 results) No results for input(s): "PROBNP" in the last 8760 hours. HbA1C: No results for input(s): "HGBA1C" in the last 72 hours. CBG: No results for input(s): "GLUCAP" in the last 168 hours. Lipid Profile: No results for input(s): "CHOL", "HDL", "LDLCALC", "TRIG", "CHOLHDL", "LDLDIRECT" in the last 72 hours. Thyroid Function Tests: No results for input(s): "TSH", "T4TOTAL", "FREET4", "T3FREE", "THYROIDAB" in the last 72 hours. Anemia Panel: Recent Labs    01/22/22 1018  VITAMINB12 598  FOLATE 34.0   Urine analysis:    Component Value Date/Time   COLORURINE STRAW (A) 01/21/2022 1343   APPEARANCEUR CLEAR (A) 01/21/2022 1343   APPEARANCEUR Clear 10/18/2011 0958   LABSPEC 1.008 01/21/2022 1343   LABSPEC 1.021 10/18/2011 0958   PHURINE 8.0 01/21/2022 1343   GLUCOSEU 50 (A) 01/21/2022 1343   GLUCOSEU Negative 10/18/2011 0958   HGBUR SMALL (A) 01/21/2022 1343   BILIRUBINUR NEGATIVE 01/21/2022 1343    BILIRUBINUR Negative 10/18/2011 [place] 01/21/2022 1343   PROTEINUR 30 (A) 01/21/2022 1343   NITRITE NEGATIVE 01/21/2022 1343   LEUKOCYTESUR NEGATIVE 01/21/2022 1343   LEUKOCYTESUR Negative 10/18/2011 0958   Recent Results (from the past 240 hour(s))  SARS Coronavirus 2 by RT PCR (hospital order, performed in [facility] hospital lab) *cepheid single result test* Anterior Nasal Swab     Status: Abnormal   Collection Time: 01/21/22 11:00 AM   Specimen: Anterior Nasal Swab  Result Value Ref Range Status   SARS Coronavirus 2 by RT PCR POSITIVE (A) NEGATIVE Final    Comment: (NOTE) SARS-CoV-2 target nucleic acids are DETECTED  SARS-CoV-2 RNA is generally detectable in upper respiratory specimens  during the acute phase of infection.  Positive results are indicative  of the presence of the identified virus, but do not rule out bacterial infection or co-infection with other pathogens not detected by the test.  Clinical correlation with patient history and  other diagnostic information is necessary to determine patient infection status.  The expected result is negative.  Fact Sheet for Patients:   https://www.patel.info/   Fact  Sheet for Healthcare Providers:   https://hall.com/    This test is not yet approved or cleared by the [place] and  has been authorized for detection and/or diagnosis of SARS-CoV-2 by FDA under an Emergency Use Authorization (EUA).  This EUA will remain in effect (meaning this test can be used) for the duration of  the COVID-19 declaration under Section 564(b)(1)  of the Act, 21 U.S.C. section 360-bbb-3(b)(1), unless the authorization is terminated or revoked sooner.   Performed at [facility], [address] [place], [place] [address]   Culture, blood (routine x 2)     Status: None (Preliminary result)   Collection Time: 01/21/22 12:53 PM   Specimen: BLOOD  Result Value Ref  Range Status   Specimen Description BLOOD RIGHT [facility]  Final   Special Requests   Final    BOTTLES DRAWN AEROBIC AND ANAEROBIC Blood Culture results may not be optimal due to an inadequate volume of blood received in culture bottles   Culture   Final    NO GROWTH < 24 HOURS Performed at [facility], [address], [place], [place] [address]    Report Status PENDING  Incomplete  Culture, blood (routine x 2)     Status: None (Preliminary result)   Collection Time: 01/21/22 12:53 PM   Specimen: BLOOD  Result Value Ref Range Status   Specimen Description BLOOD LEFT AC  Final   Special Requests   Final    BOTTLES DRAWN AEROBIC AND ANAEROBIC Blood Culture adequate volume   Culture   Final    NO GROWTH < 24 HOURS Performed at [facility], [address], [place], [place] [address]    Report Status PENDING  Incomplete  MRSA Next Gen by PCR, Nasal     Status: None   Collection Time: 01/21/22  2:39 PM   Specimen: Nasal Swab  Result Value Ref Range Status   MRSA by PCR Next Gen NOT DETECTED NOT DETECTED Final    Comment: (NOTE) The GeneXpert MRSA Assay (FDA approved for NASAL specimens only), is one component of a comprehensive MRSA colonization surveillance program. It is not intended to diagnose MRSA infection nor to guide or monitor treatment for MRSA infections. Test performance is not FDA approved in patients less than 63 years old. Performed at [facility], [address] [place], [place] [address]      DG Abd Portable 1V  Result Date: 01/22/2022 CLINICAL DATA:  Constipation, distended abdomen EXAM: PORTABLE ABDOMEN - 1 VIEW COMPARISON:  2012 FINDINGS: Air-filled loops of small and large bowel without significant dilatation. No significant stool burden. Postoperative changes of the lumbar spine and bilateral hips. IMPRESSION: Air-filled bowel loops without significant dilatation. No significant stool burden. Electronically Signed   By: Macy Mis  M.D.   On: 01/22/2022 12:02   DG Chest 2 View  Result Date: 01/21/2022 CLINICAL DATA:  SOB EXAM: CHEST - 2 VIEW COMPARISON:  Chest radiograph 08/15/2017. FINDINGS: Similar enlargement the cardiac silhouette. No consolidation. No visible pleural effusions or pneumothorax. No acute osseous abnormality. IMPRESSION: Similar cardiomegaly without evidence of acute cardiopulmonary disease. Electronically Signed   By: Margaretha Sheffield M.D.   On: 01/21/2022 11:43   CT Head Wo Contrast  Result Date: 01/21/2022 CLINICAL DATA:  76 year old male with history of mental status change. EXAM: CT HEAD WITHOUT CONTRAST TECHNIQUE: Contiguous axial images were obtained from the base of the skull through the vertex without intravenous contrast. RADIATION DOSE REDUCTION: This  exam was performed according to the departmental dose-optimization program which includes automated exposure control, adjustment of the mA and/or kV according to patient size and/or use of iterative reconstruction technique. COMPARISON:  No priors. FINDINGS: Brain: Mild cerebral atrophy. Patchy and confluent areas of decreased attenuation are noted throughout the deep and periventricular white matter of the cerebral hemispheres bilaterally, compatible with chronic microvascular ischemic disease. More well-defined area of low attenuation in the right occipital lobe, presumably an area of mild encephalomalacia related to remote right PCA territory infarct. No evidence of acute infarction, hemorrhage, hydrocephalus, extra-axial collection or mass lesion/mass effect. Vascular: No hyperdense vessel or unexpected calcification. Skull: Normal. Negative for fracture or focal lesion. Sinuses/Orbits: No acute finding. Other: None. IMPRESSION: 1. No acute intracranial abnormalities. 2. 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  Electronically Signed   By: Vinnie Langton M.D.   On: 01/21/2022 11:29    Family Communication: None at bedside, will speak to wife by phone  Disposition: Status is: Inpatient Remains inpatient appropriate because: Neutropenic sepsis, Covid management Planned Discharge Destination:  TBD    Patrecia Pour, MD 01/22/2022 3:06 PM Page by [email or web address]

## 2022-01-22 NOTE — Consult Note (Signed)
Hematology/Oncology Consult note [facility] [phone] [phone]  Patient Care Team: [name], MD as PCP - General (Internal Medicine) Minna Merritts, MD as PCP - Cardiology (Cardiology)   Name of the patient: Daniel Kline  [id number]  1946-01-30    Reason for consult: Neutropenia  Requesting physician: Dr. Charna Archer  Date of visit: 01/22/2022    History of presenting illness- Patient is a 76 year old male with a past medical history significant for hypertension hyperlipidemia type 2 diabetes, CKD who had symptoms of generalized fatigue body aches cough for about a week.  He went to urgent care for evaluation and was found to be somewhat disoriented.  He was sent to ER for further evaluation.  He tested positive for COVID on admission.  Incidentally patient was found to have leukopenia with a white cell count of 1.3, H&H of 11.1/34.1 with a platelet count of 416.  ANC was low at 0.4.  His labs from 7 months prior to that showed a normal white count of 5.4 and in general his white cell count has been fluctuating between 4-7. Patient on weekly methotrexate for rheumatoid arthritis   Review of systems- Review of Systems  Constitutional:  Positive for malaise/fatigue.  Respiratory:  Positive for cough.     Allergies  Allergen Reactions   Bee Venom Anaphylaxis   Oxycodone Other (See Comments)    Delusions Delusions   Delusions   Hydromorphone Other (See Comments)    hallucinating   Hydroxychloroquine     Other reaction(s): Other (See Comments) He broke out really badly.   Zolpidem Other (See Comments)    Patient Active Problem List   Diagnosis Date Noted   Neutropenic sepsis ([place]) 01/21/2022   COVID-19 virus infection 01/21/2022   Mood disorder ([place]) 12/24/2021   Chronic renal disease, stage IV ([place]) 06/18/2021   Chronic pain 06/13/2020   Anemia in chronic kidney disease 04/02/2020   Benign hypertensive kidney  disease with chronic kidney disease 04/02/2020   Proteinuria 04/02/2020   Renal mass 04/02/2020   Secondary hyperparathyroidism of renal origin ([place]) 04/02/2020   Preventative health care 03/10/2020   Total knee replacement status 01/28/2020   Encounter for long-term (current) use of high-risk medication 06/14/2019   Seronegative rheumatoid arthritis ([place]) 06/14/2019   Chronic narcotic dependence ([place]) 75/64/3329   Anti-cyclic citrullinated peptide antibody positive 05/03/2019   Synovitis of hand 05/03/2019   Radial neuropathy, left 09/06/2018   Type 2 diabetes mellitus with diabetic nephropathy ([place]) 06/22/2018   Chronic bilateral low back pain without sciatica 06/09/2018   Knee pain, chronic 04/12/2018   OSA (obstructive sleep apnea) 09/07/2017   History of poliomyelitis 08/27/2017   Localized edema 08/12/2017   Actinic keratosis 07/26/2017   History of total hip arthroplasty, bilateral 06/09/2016   GERD (gastroesophageal reflux disease) 04/21/2016   PONV (postoperative nausea and vomiting) 04/21/2016   Skin cancer 04/21/2016   Asymptomatic gallstones 01/06/2016   Atrial flutter ([place]) 10/29/2015   Venous stasis dermatitis of both lower extremities 10/29/2015   Stage 3b chronic kidney disease ([place]) 09/27/2015   Morbid obesity ([place]) 09/27/2015   Primary osteoarthritis of right knee 08/26/2015   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 forearm near wrist. Severe atypia, close to peripheral margin.   Dysplastic nevus  06/21/2018   Upper back right paraspinal. Severe atypia, peripheral margin involved. Excised 07/11/2018, margins free.   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 forearm. SCCis, hypertrophic.     Past Surgical History:  Procedure Laterality Date   BACK SURGERY     LUMBAR   CARDIAC ELECTROPHYSIOLOGY STUDY AND ABLATION  2019   COLONOSCOPY WITH PROPOFOL N/A 04/04/2019   Procedure: COLONOSCOPY WITH PROPOFOL;  Surgeon: Toledo, Benay Pike, MD;  Location: [facility] ENDOSCOPY;  Service: Gastroenterology;  Laterality: N/A;   I & D EXTREMITY Right 02/01/2020   Procedure: IRRIGATION AND DEBRIDEMENT EXTREMITY with poly exchange;  Surgeon: Dereck Leep, MD;  Location: [facility] ORS;  Service: Orthopedics;  Laterality: Right;   INCISION AND DRAINAGE     BACK-MRSA INFECTION AFTER BACK SURGERY   KNEE ARTHROPLASTY Right 01/28/2020   Procedure: COMPUTER ASSISTED TOTAL KNEE ARTHROPLASTY;  Surgeon: Dereck Leep, MD;  Location: [facility] ORS;  Service: Orthopedics;  Laterality: Right;   MOUTH SURGERY     right elbow surgery     right knee surgery     right shoulder surgery     from polio damage   TONSILLECTOMY     TOTAL HIP ARTHROPLASTY Bilateral 04/2016    Social History   Socioeconomic History   Marital status: Married    Spouse name: Not on file   Number of children: 0   Years of education: Not on file   Highest education level: Not on file  Occupational History   Occupation:  Nature conservation officer    Comment: 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   Occupation: Cytogeneticist    Comment: Retired   Occupation: Warden/ranger    Comment: Retired  Tobacco Use   Smoking status: Former    Types: Cigars    Quit date: 09/23/1990    Years since quitting: 31.3   Smokeless tobacco: Former    Quit date: 02/25/2006  Vaping Use   Vaping Use: Never used  Substance and Sexual Activity   Alcohol use: No    Alcohol/week: 0.0 standard drinks of alcohol   Drug use: No   Sexual activity: Yes    Partners: Female  Other Topics Concern   Not on file  Social History Narrative   Has living will   Wife is health care POA---then brother or sister   Would accept resuscitation attempts but no prolonged ventilation or tube feeds   Social Determinants of Health   Financial Resource Strain: Low Risk  (07/20/2017)   Overall Financial Resource Strain (CARDIA)    Difficulty of Paying  Living Expenses: Not hard at all  Food Insecurity: No Food Insecurity (07/20/2017)   Hunger Vital Sign    Worried About Running Out of Food in the Last Year: Never true    Ran Out of Food in the Last Year: Never true  Transportation Needs: No Transportation Needs (07/20/2017)   PRAPARE - Hydrologist (Medical): No    Lack of Transportation (Non-Medical): No  Physical Activity: Not on file  Stress: Not on file  Social Connections: Unknown (07/20/2017)   Social Connection and Isolation Panel [NHANES]    Frequency of Communication with Friends and Family: Patient refused    Frequency of Social Gatherings with Friends and Family: Patient refused    Attends Religious Services: Patient refused    Active Member of [place] or Organizations: Patient refused    Attends Archivist Meetings: Patient refused    Marital Status: Patient refused  Intimate Partner Violence: Not At Risk (07/20/2017)   Humiliation, Afraid, Rape, and Kick questionnaire    Fear of  Current or Ex-Partner: No    Emotionally Abused: No    Physically Abused: No    Sexually Abused: No     Family History  Adopted: Yes     Current Facility-Administered Medications:    0.9 %  sodium chloride infusion, , Intravenous, PRN, Patrecia Pour, MD, Last Rate: 10 mL/hr at 01/22/22 0847, New Bag at 01/22/22 0847   acetaminophen (TYLENOL) tablet 650 mg, 650 mg, Oral, Q6H PRN **OR** acetaminophen (TYLENOL) suppository 650 mg, 650 mg, Rectal, Q6H PRN, Cox, Amy N, DO   amLODipine (NORVASC) tablet 5 mg, 5 mg, Oral, Daily, Cox, Amy N, DO, 5 mg at 01/22/22 0808   carvedilol (COREG) tablet 12.5 mg, 12.5 mg, Oral, BID WC, Cox, Amy N, DO, 12.5 mg at 01/22/22 5462   ceFEPIme (MAXIPIME) 2 g in sodium chloride 0.9 % 100 mL IVPB, 2 g, Intravenous, Q12H, Cox, Amy N, DO, Stopped at 01/21/22 2123   chlorpheniramine-HYDROcodone 10-8 MG/5ML suspension 5 mL, 5 mL, Oral, QHS PRN, Cox, Amy N, DO, 5 mL at 01/21/22 2034   clorazepate (TRANXENE) tablet 7.5 mg, 7.5 mg, Oral, BID PRN, Cox, Amy N, DO   enoxaparin (LOVENOX) injection 40 mg, 40 mg, Subcutaneous, QHS, Cox, Amy N, DO, 40 mg at 70/35/00 9381   folic acid (FOLVITE) tablet 1 mg, 1 mg, Oral, Daily, Cox, Amy N, DO, 1 mg at 01/22/22 8299   furosemide (LASIX) tablet 40 mg, 40 mg, Oral, BID, Cox, Amy N, DO, 40 mg at 01/22/22 3716   gabapentin (NEURONTIN) capsule 300 mg, 300 mg, Oral, TID, Cox, Amy N, DO, 300 mg at 01/22/22 9678   guaiFENesin (MUCINEX) 12 hr tablet 600 mg, 600 mg, Oral, BID PRN, Cox, Amy N, DO, 600 mg at 01/22/22 0214   hydrALAZINE (APRESOLINE) injection 5 mg, 5 mg, Intravenous, Q6H PRN, Cox, Amy N, DO   HYDROcodone-acetaminophen (NORCO/VICODIN) 5-325 MG per tablet 1 tablet, 1 tablet, Oral, Q6H PRN, Cox, Amy N, DO, 1 tablet at 01/21/22 2034   hydrOXYzine (ATARAX) tablet 25 mg, 25 mg, Oral, Q6H PRN, Cox, Amy N, DO, 25 mg at 01/21/22 2034   isosorbide mononitrate (IMDUR) 24 hr tablet 60 mg, 60 mg, Oral, QAC lunch, Cox, Amy N, DO   losartan  (COZAAR) tablet 100 mg, 100 mg, Oral, Daily, Cox, Amy N, DO, 100 mg at 01/22/22 0807   metFORMIN (GLUCOPHAGE) tablet 500 mg, 500 mg, Oral, BID WC, Cox, Amy N, DO  metroNIDAZOLE (FLAGYL) IVPB 500 mg, 500 mg, Intravenous, Q12H, Cox, Amy N, DO, Stopped at 01/22/22 0530   ondansetron (ZOFRAN) tablet 4 mg, 4 mg, Oral, Q6H PRN **OR** ondansetron (ZOFRAN) injection 4 mg, 4 mg, Intravenous, Q6H PRN, Cox, Amy N, DO   polyethylene glycol (MIRALAX / GLYCOLAX) packet 17 g, 17 g, Oral, Daily PRN, Cox, Amy N, DO   [COMPLETED] remdesivir 200 mg in sodium chloride 0.9% 250 mL IVPB, 200 mg, Intravenous, Once, Last Rate: 580 mL/hr at 01/22/22 0848, 200 mg at 01/22/22 0848 **FOLLOWED BY** [START ON 01/23/2022] remdesivir 100 mg in sodium chloride 0.9 % 100 mL IVPB, 100 mg, Intravenous, Daily, Vance Gather B, MD   rosuvastatin (CRESTOR) tablet 20 mg, 20 mg, Oral, Daily, Cox, Amy N, DO, 20 mg at 01/22/22 0807   [START ON 01/23/2022] vancomycin (VANCOREADY) IVPB 1250 mg/250 mL, 1,250 mg, Intravenous, Q48H, Cox, Amy N, DO   Physical exam:  Vitals:   01/21/22 1643 01/21/22 1934 01/22/22 0804 01/22/22 0808  BP:  (!) 146/93 (!) 150/103   Pulse:  87 (!) 58 80  Resp:  18 (!) 24   Temp:  98.8 F (37.1 C) 99.3 F (37.4 C)   TempSrc:      SpO2:  94% 94%   Weight: 231 lb 4.2 oz (104.9 kg)     Height: 5' 5" (1.651 m)      Physical Exam Constitutional:      General: He is not in acute distress. Cardiovascular:     Rate and Rhythm: Normal rate and regular rhythm.     Heart sounds: Normal heart sounds.  Pulmonary:     Effort: Pulmonary effort is normal.     Breath sounds: Normal breath sounds.  Abdominal:     General: Bowel sounds are normal.     Palpations: Abdomen is soft.  Skin:    General: Skin is warm and dry.  Neurological:     Mental Status: He is alert and oriented to person, place, and time.           Latest Ref Rng & Units 01/22/2022    4:59 AM  CMP  Glucose 70 - 99 mg/dL 103   BUN 8 - 23 mg/dL  13   Creatinine 0.61 - 1.24 mg/dL 1.55   Sodium 135 - 145 mmol/L 137   Potassium 3.5 - 5.1 mmol/L 3.9   Chloride 98 - 111 mmol/L 108   CO2 22 - 32 mmol/L 23   Calcium 8.9 - 10.3 mg/dL 7.4       Latest Ref Rng & Units 01/22/2022    4:59 AM  CBC  WBC 4.0 - 10.5 K/uL 1.7   Hemoglobin 13.0 - 17.0 g/dL 10.5   Hematocrit 39.0 - 52.0 % 32.3   Platelets 150 - 400 K/uL 435     _0 @  DG Chest 2 View  Result Date: 01/21/2022 CLINICAL DATA:  SOB EXAM: CHEST - 2 VIEW COMPARISON:  Chest radiograph 08/15/2017. FINDINGS: Similar enlargement the cardiac silhouette. No consolidation. No visible pleural effusions or pneumothorax. No acute osseous abnormality. IMPRESSION: Similar cardiomegaly without evidence of acute cardiopulmonary disease. Electronically Signed   By: Margaretha Sheffield M.D.   On: 01/21/2022 11:43   CT Head Wo Contrast  Result Date: 01/21/2022 CLINICAL DATA:  76 year old male with history of mental status change. EXAM: CT HEAD WITHOUT CONTRAST TECHNIQUE: Contiguous axial images were obtained from the base of the skull through the vertex without intravenous contrast. RADIATION DOSE REDUCTION: This exam was  performed according to the departmental dose-optimization program which includes automated exposure control, adjustment of the mA and/or kV according to patient size and/or use of iterative reconstruction technique. COMPARISON:  No priors. FINDINGS: Brain: Mild cerebral atrophy. Patchy and confluent areas of decreased attenuation are noted throughout the deep and periventricular white matter of the cerebral hemispheres bilaterally, compatible with chronic microvascular ischemic disease. More well-defined area of low attenuation in the right occipital lobe, presumably an area of mild encephalomalacia related to remote right PCA territory infarct. No evidence of acute infarction, hemorrhage, hydrocephalus, extra-axial collection or mass lesion/mass effect. Vascular: No hyperdense vessel or  unexpected calcification. Skull: Normal. Negative for fracture or focal lesion. Sinuses/Orbits: No acute finding. Other: None. IMPRESSION: 1. No acute intracranial abnormalities. 2. 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 Electronically Signed   By: Vinnie Langton M.D.   On: 01/21/2022 11:29    Assessment and plan- Patient is a 76 y.o. male admitted for cough malaise body aches and confusion found to have COVID.  Hematology consulted for neutropenia.  At baseline patient's white cell count runs between 4-7.  His white cell count was noted to be 3.3 in April 2023.  It then went down to 2.4 in May 2023.  Hemoglobin has remained stable around 11.  Platelet counts have been normal.  His white cell count today is 1.7 with a neutrophil count of 0.4.  Again hemoglobin and platelets are somewhat stable.  Although acute COVID can cause cytopenias it appears that patient's white cell count has beenTrending down since April 2023.  He has mild anemia and predominant neutropenia in the absence of thrombocytopenia.  I would recommend that he should be observed for 24 to 48 hours as far as his COVID is concerned and as long as he remains afebrile he can be discharged.  I will avoid giving him any Neupogen at this time for his neutropenia as it can potentially cause cytokine release and potential worsening of COVID.  Patient will also stop taking his methotrexate at this time as neutropenia can be seen with methotrexate 2.    I will plan to follow-up with him as an outpatient in about 2 weeks time and if his neutropenia persists I will consider doing a bone marrow biopsy as an outpatient if his neutropenia persists despite stopping methotrexate..  Smear review has been unremarkable so far.  I will check peripheral flow cytometry B12 folate HIV and hepatitis C testing in the  morning    Visit Diagnosis 1. Febrile neutropenia (HCC)   2. Altered mental status, unspecified altered mental status type   3. COVID-19     Dr. Randa Evens, MD, MPH [facility] at [facility] [phone] 01/22/2022

## 2022-01-22 NOTE — Care Management Obs Status (Signed)
MEDICARE OBSERVATION STATUS NOTIFICATION   Patient Details  Name: Daniel Kline MRN: [id number] Date of Birth: 13-Jul-1946   Medicare Observation Status Notification Given:  Yes    Alberteen Sam, LCSW 01/22/2022, 11:15 AM

## 2022-01-22 NOTE — Progress Notes (Signed)
PT has a home CPAP that he is non compliant with, does not want hospital unit at this time. Stable on room air at this time.

## 2022-01-22 NOTE — Plan of Care (Signed)
  Problem: Fluid Volume: Goal: Hemodynamic stability will improve Outcome: Progressing   Problem: Respiratory: Goal: Ability to maintain adequate ventilation will improve Outcome: Progressing   Problem: Education: Goal: Knowledge of General Education information will improve Description: Including pain rating scale, medication(s)/side effects and non-pharmacologic comfort measures Outcome: Progressing   Problem: Clinical Measurements: Goal: Respiratory complications will improve Outcome: Progressing

## 2022-01-23 DIAGNOSIS — D709 Neutropenia, unspecified: Secondary | ICD-10-CM | POA: Diagnosis not present

## 2022-01-23 DIAGNOSIS — A419 Sepsis, unspecified organism: Secondary | ICD-10-CM | POA: Diagnosis not present

## 2022-01-23 DIAGNOSIS — I480 Paroxysmal atrial fibrillation: Secondary | ICD-10-CM

## 2022-01-23 DIAGNOSIS — U071 COVID-19: Secondary | ICD-10-CM | POA: Diagnosis not present

## 2022-01-23 LAB — CREATININE, SERUM
Creatinine, Ser: 2.1 mg/dL — ABNORMAL HIGH (ref 0.61–1.24)
GFR, Estimated: 32 mL/min — ABNORMAL LOW (ref >60)

## 2022-01-23 LAB — CBC WITH DIFFERENTIAL/PLATELET
Abs Immature Granulocytes: 0.03 10*3/uL (ref 0.00–0.07)
Basophils Absolute: 0 10*3/uL (ref 0.0–0.1)
Basophils Relative: 1 %
Eosinophils Absolute: 0 10*3/uL (ref 0.0–0.5)
Eosinophils Relative: 1 %
HCT: 34.8 % — ABNORMAL LOW (ref 39.0–52.0)
Hemoglobin: 11.3 g/dL — ABNORMAL LOW (ref 13.0–17.0)
Immature Granulocytes: 1 %
Lymphocytes Relative: 41 %
Lymphs Abs: 0.9 10*3/uL (ref 0.7–4.0)
MCH: 30.7 pg (ref 26.0–34.0)
MCHC: 32.5 g/dL (ref 30.0–36.0)
MCV: 94.6 fL (ref 80.0–100.0)
Monocytes Absolute: 0.7 10*3/uL (ref 0.1–1.0)
Monocytes Relative: 32 %
Neutro Abs: 0.5 10*3/uL — ABNORMAL LOW (ref 1.7–7.7)
Neutrophils Relative %: 24 %
Platelets: 607 10*3/uL — ABNORMAL HIGH (ref 150–400)
RBC: 3.68 MIL/uL — ABNORMAL LOW (ref 4.22–5.81)
RDW: 16.9 % — ABNORMAL HIGH (ref 11.5–15.5)
Smear Review: NORMAL
WBC: 2.2 10*3/uL — ABNORMAL LOW (ref 4.0–10.5)
nRBC: 0 % (ref 0.0–0.2)

## 2022-01-23 LAB — GLUCOSE, CAPILLARY: Glucose-Capillary: 89 mg/dL (ref 70–99)

## 2022-01-23 LAB — C-REACTIVE PROTEIN: CRP: 2.1 mg/dL — ABNORMAL HIGH (ref <1.0)

## 2022-01-23 MED ORDER — GUAIFENESIN-DM 100-10 MG/5ML PO SYRP
5.0000 mL | ORAL_SOLUTION | ORAL | Status: DC | PRN
  Administered 2022-01-24 – 2022-01-25 (×6): 5 mL via ORAL
  Filled 2022-01-23 (×6): qty 10

## 2022-01-23 MED ORDER — HYDROCOD POLI-CHLORPHE POLI ER 10-8 MG/5ML PO SUER
5.0000 mL | Freq: Every evening | ORAL | Status: AC | PRN
  Administered 2022-01-23: 5 mL via ORAL
  Filled 2022-01-23: qty 5

## 2022-01-23 MED ORDER — CARVEDILOL 6.25 MG PO TABS
3.1250 mg | ORAL_TABLET | Freq: Two times a day (BID) | ORAL | Status: DC
  Administered 2022-01-24 – 2022-01-27 (×7): 3.125 mg via ORAL
  Filled 2022-01-23 (×7): qty 1

## 2022-01-23 MED ORDER — ENOXAPARIN SODIUM 60 MG/0.6ML IJ SOSY
0.5000 mg/kg | PREFILLED_SYRINGE | Freq: Every day | INTRAMUSCULAR | Status: DC
  Administered 2022-01-23 – 2022-01-26 (×4): 52.5 mg via SUBCUTANEOUS
  Filled 2022-01-23 (×5): qty 0.6

## 2022-01-23 NOTE — Plan of Care (Signed)
  Problem: Respiratory: Goal: Ability to maintain adequate ventilation will improve Outcome: Progressing   Problem: Education: Goal: Knowledge of General Education information will improve Description: Including pain rating scale, medication(s)/side effects and non-pharmacologic comfort measures Outcome: Progressing   Problem: Clinical Measurements: Goal: Respiratory complications will improve Outcome: Progressing   Problem: Activity: Goal: Risk for activity intolerance will decrease Outcome: Progressing   Problem: Nutrition: Goal: Adequate nutrition will be maintained Outcome: Progressing   Problem: Coping: Goal: Level of anxiety will decrease Outcome: Progressing   

## 2022-01-23 NOTE — Assessment & Plan Note (Signed)
EKG checked due to bradycardia on 6/17 noted mild bradycardic atrial fibrillation.  Will discuss with cardiology.  Will decrease Coreg but not discontinue

## 2022-01-23 NOTE — Progress Notes (Signed)
[facility] Progress Note  Patient: Daniel Kline    JME:[id number]  DOA: 01/21/2022    Date of Service: the patient was seen and examined on 01/23/2022  Brief hospital course: 76 year old male with past medical history of morbid obesity, diabetes mellitus, hypertension and obstructive sleep apnea presented to the emergency room on 6/16 with confusion.  He had tested positive for COVID that day.  Patient found to be in sepsis with neutropenia.  He was started on Remdisivir.  Over the next 24 hours, confusion resolved.    Assessment and Plan: Assessment and Plan: * Neutropenic sepsis ([place]) Suspect his leukopenia is due to methotrexate which has been stopped.  Lymphopenia may be from COVID infection.  For now, continue broad-spectrum antibiotics.  Blood cultures with no growth to date.  Hematology following.  If his neutropenia persists, outpatient bone marrow biopsy.  Smear review unremarkable so far.    Paroxysmal atrial fibrillation (HCC) EKG checked due to bradycardia on 6/17 noted mild bradycardic atrial fibrillation.  Will discuss with cardiology.  Will decrease Coreg but not discontinue  COVID-19 virus infection Continue Remdisivir.  Patient still having cough, but appears to be more stable.  Stage 3b chronic kidney disease (HCC) - At baseline  Essential hypertension - Resumed home amlodipine 5 mg daily, Coreg 12.5 mg p.o. twice daily with meals, furosemide 40 mg twice daily, losartan 100 mg daily, Imdur 60 mg daily before lunch.  Monitor for hypotension.  Patient has had some episodes of bradycardia so we will stop his Coreg.  Chronic pain - Resumed home Norco 5-3 25, every 6 hours.  For moderate pain  OSA (obstructive sleep apnea) - CPAP nightly ordered  Hypercholesterolemia - Rosuvastatin 20 mg daily resumed  Morbid obesity ([place]) - This meets criteria for morbid obesity based on the presence of 1 or more chronic comorbidities. Patient has obstructive sleep apnea,  diabetes mellitus with a BMI of 38. This complicates overall care and prognosis.        Body mass index is 38.48 kg/m.        Consultants: Hematology  Procedures: None  Antimicrobials: Antibiotics Given (last 72 hours)     Date/Time Action Medication Dose Rate   01/21/22 1321 New Bag/Given   ceFEPIme (MAXIPIME) 2 g in sodium chloride 0.9 % 100 mL IVPB 2 g 200 mL/hr   01/21/22 1359 New Bag/Given   metroNIDAZOLE (FLAGYL) IVPB 500 mg 500 mg 100 mL/hr   01/21/22 1456 New Bag/Given   vancomycin (VANCOREADY) IVPB 2000 mg/400 mL 2,000 mg 200 mL/hr   01/21/22 2039 New Bag/Given   ceFEPIme (MAXIPIME) 2 g in sodium chloride 0.9 % 100 mL IVPB 2 g 200 mL/hr   01/22/22 0209 New Bag/Given   metroNIDAZOLE (FLAGYL) IVPB 500 mg 500 mg 100 mL/hr   01/22/22 0848 New Bag/Given   remdesivir 200 mg in sodium chloride 0.9% 250 mL IVPB 200 mg 580 mL/hr   01/22/22 1001 New Bag/Given   ceFEPIme (MAXIPIME) 2 g in sodium chloride 0.9 % 100 mL IVPB 2 g 200 mL/hr   01/22/22 1322 New Bag/Given   metroNIDAZOLE (FLAGYL) IVPB 500 mg 500 mg 100 mL/hr   01/22/22 1457 New Bag/Given   vancomycin (VANCOREADY) IVPB 750 mg/150 mL 750 mg 150 mL/hr   01/23/22 0028 New Bag/Given   ceFEPIme (MAXIPIME) 2 g in sodium chloride 0.9 % 100 mL IVPB 2 g 200 mL/hr   01/23/22 0246 New Bag/Given   metroNIDAZOLE (FLAGYL) IVPB 500 mg 500 mg 100 mL/hr  01/23/22 1148 New Bag/Given   remdesivir 100 mg in sodium chloride 0.9 % 100 mL IVPB 100 mg 200 mL/hr   01/23/22 1244 New Bag/Given   ceFEPIme (MAXIPIME) 2 g in sodium chloride 0.9 % 100 mL IVPB 2 g 200 mL/hr   01/23/22 1357 New Bag/Given   metroNIDAZOLE (FLAGYL) IVPB 500 mg 500 mg 100 mL/hr   01/23/22 1524 New Bag/Given   vancomycin (VANCOREADY) IVPB 750 mg/150 mL 750 mg 150 mL/hr         Code Status: Full code   Subjective: Patient complains of ongoing cough, but overall feels better  Objective: Noted softer blood pressures and bradycardia Vitals:   01/23/22  1150 01/23/22 1527  BP: (!) 99/58 (!) 112/91  Pulse: 87 (!) 53  Resp: 16 18  Temp: 98.6 F (37 C) 98.1 F (36.7 C)  SpO2: 96% 96%    Intake/Output Summary (Last 24 hours) at 01/23/2022 1730 Last data filed at 01/23/2022 1724 Gross per 24 hour  Intake 757.02 ml  Output 850 ml  Net -92.98 ml   Filed Weights   01/21/22 1300 01/21/22 1643  Weight: 104 kg 104.9 kg   Body mass index is 38.48 kg/m.  Exam:  General: Alert and oriented x3, mild distress from coughing HEENT: Normocephalic and atraumatic, mucous membranes slightly dry Cardiovascular: Irregular rhythm, mild bradycardia Respiratory: Few Rales Abdomen: Soft, nontender, nondistended, positive bowel sounds Musculoskeletal: No clubbing or cyanosis, trace pitting edema Skin: No skin breaks, tears or lesions Psychiatry: Appropriate, no evidence of psychoses Neurology: No focal deficits  Data Reviewed: Neutropenia improving, white blood cell count up to 2.2.  Platelet count up to 607  Disposition:  Status is: Inpatient Remains inpatient appropriate because: Improving neutropenia    Anticipated discharge date: 6/19  Remaining issues to be resolved so that patient can be discharged: Atrial fibrillation discussed with cardiology   Family Communication: Left message for wife DVT Prophylaxis: Place TED hose Start: 01/21/22 1310    Author: Annita Brod ,MD 01/23/2022 5:30 PM  To reach On-call, see care teams to locate the attending and reach out via [email or web address]. Between 7PM-7AM, please contact night-coverage If you still have difficulty reaching the attending provider, please page the [facility] (Director on Call) for [facility] on amion for assistance.

## 2022-01-24 DIAGNOSIS — D709 Neutropenia, unspecified: Secondary | ICD-10-CM | POA: Diagnosis not present

## 2022-01-24 DIAGNOSIS — U071 COVID-19: Secondary | ICD-10-CM | POA: Diagnosis not present

## 2022-01-24 DIAGNOSIS — A419 Sepsis, unspecified organism: Secondary | ICD-10-CM | POA: Diagnosis not present

## 2022-01-24 LAB — CBC WITH DIFFERENTIAL/PLATELET
Abs Immature Granulocytes: 0.08 10*3/uL — ABNORMAL HIGH (ref 0.00–0.07)
Basophils Absolute: 0.1 10*3/uL (ref 0.0–0.1)
Basophils Relative: 2 %
Eosinophils Absolute: 0.1 10*3/uL (ref 0.0–0.5)
Eosinophils Relative: 3 %
HCT: 35.9 % — ABNORMAL LOW (ref 39.0–52.0)
Hemoglobin: 11.8 g/dL — ABNORMAL LOW (ref 13.0–17.0)
Immature Granulocytes: 3 %
Lymphocytes Relative: 38 %
Lymphs Abs: 0.9 10*3/uL (ref 0.7–4.0)
MCH: 31.1 pg (ref 26.0–34.0)
MCHC: 32.9 g/dL (ref 30.0–36.0)
MCV: 94.5 fL (ref 80.0–100.0)
Monocytes Absolute: 0.7 10*3/uL (ref 0.1–1.0)
Monocytes Relative: 30 %
Neutro Abs: 0.6 10*3/uL — ABNORMAL LOW (ref 1.7–7.7)
Neutrophils Relative %: 24 %
Platelets: 693 10*3/uL — ABNORMAL HIGH (ref 150–400)
RBC: 3.8 MIL/uL — ABNORMAL LOW (ref 4.22–5.81)
RDW: 16.9 % — ABNORMAL HIGH (ref 11.5–15.5)
Smear Review: NORMAL
WBC: 2.5 10*3/uL — ABNORMAL LOW (ref 4.0–10.5)
nRBC: 0 % (ref 0.0–0.2)

## 2022-01-24 LAB — BRAIN NATRIURETIC PEPTIDE: B Natriuretic Peptide: 64.7 pg/mL (ref 0.0–100.0)

## 2022-01-24 LAB — BASIC METABOLIC PANEL
Anion gap: 7 (ref 5–15)
BUN: 26 mg/dL — ABNORMAL HIGH (ref 8–23)
CO2: 25 mmol/L (ref 22–32)
Calcium: 8.5 mg/dL — ABNORMAL LOW (ref 8.9–10.3)
Chloride: 105 mmol/L (ref 98–111)
Creatinine, Ser: 2.13 mg/dL — ABNORMAL HIGH (ref 0.61–1.24)
GFR, Estimated: 31 mL/min — ABNORMAL LOW (ref >60)
Glucose, Bld: 137 mg/dL — ABNORMAL HIGH (ref 70–99)
Potassium: 3.6 mmol/L (ref 3.5–5.1)
Sodium: 137 mmol/L (ref 135–145)

## 2022-01-24 LAB — C-REACTIVE PROTEIN: CRP: 1.5 mg/dL — ABNORMAL HIGH (ref <1.0)

## 2022-01-24 MED ORDER — HYDRALAZINE HCL 25 MG PO TABS
25.0000 mg | ORAL_TABLET | Freq: Three times a day (TID) | ORAL | Status: DC
  Administered 2022-01-24 – 2022-01-27 (×9): 25 mg via ORAL
  Filled 2022-01-24 (×9): qty 1

## 2022-01-24 NOTE — Progress Notes (Signed)
[facility] Progress Note  Patient: Daniel Kline    JQB:[id number]  DOA: 01/21/2022    Date of Service: the patient was seen and examined on 01/24/2022  Brief hospital course: 76 year old male with past medical history of morbid obesity, diabetes mellitus, hypertension and obstructive sleep apnea presented to the emergency room on 6/16 with confusion.  He had tested positive for COVID that day.  Patient found to be in sepsis with neutropenia.  He was started on Remdisivir.  Neutropenia slowly improving.  Sepsis stabilized.  Over the initial 24 hours, confusion initially improved although nursing did note some intermittent confusion on 6/18 morning.  Assessment and Plan: Assessment and Plan: * Neutropenic sepsis ([place]) Suspect his leukopenia is due to methotrexate which has been stopped.  Lymphopenia may be from COVID infection.  For now, continue broad-spectrum antibiotics.  Blood cultures with no growth to date.  Hematology following.  Neutropenia improving.  Smear review unremarkable so far.    Paroxysmal atrial fibrillation (HCC) EKG checked due to bradycardia on 6/17 noted mild bradycardic atrial fibrillation.  Will discuss with cardiology.  Will decrease Coreg but not discontinue  COVID-19 virus infection Continue Remdisivir.  Patient still having cough, but appears to be more stable.  Stage 3b chronic kidney disease (HCC) - At baseline  Essential hypertension - Resumed home amlodipine 5 mg daily, Coreg 12.5 mg p.o. twice daily with meals, furosemide 40 mg twice daily, losartan 100 mg daily, Imdur 60 mg daily before lunch.  Monitor for hypotension.  Patient has had some episodes of bradycardia and Coreg decreased  Chronic pain - Resumed home Norco 5-3 25, every 6 hours.  For moderate pain  OSA (obstructive sleep apnea) - CPAP nightly ordered  Hypercholesterolemia - Rosuvastatin 20 mg daily resumed  Morbid obesity ([place]) - This meets criteria for morbid obesity based on  the presence of 1 or more chronic comorbidities. Patient has obstructive sleep apnea, diabetes mellitus with a BMI of 38. This complicates overall care and prognosis.        Body mass index is 38.48 kg/m.        Consultants: Hematology  Procedures: None  Antimicrobials: Antibiotics Given (last 72 hours)     Date/Time Action Medication Dose Rate   01/21/22 2039 New Bag/Given   ceFEPIme (MAXIPIME) 2 g in sodium chloride 0.9 % 100 mL IVPB 2 g 200 mL/hr   01/22/22 0209 New Bag/Given   metroNIDAZOLE (FLAGYL) IVPB 500 mg 500 mg 100 mL/hr   01/22/22 0848 New Bag/Given   remdesivir 200 mg in sodium chloride 0.9% 250 mL IVPB 200 mg 580 mL/hr   01/22/22 1001 New Bag/Given   ceFEPIme (MAXIPIME) 2 g in sodium chloride 0.9 % 100 mL IVPB 2 g 200 mL/hr   01/22/22 1322 New Bag/Given   metroNIDAZOLE (FLAGYL) IVPB 500 mg 500 mg 100 mL/hr   01/22/22 1457 New Bag/Given   vancomycin (VANCOREADY) IVPB 750 mg/150 mL 750 mg 150 mL/hr   01/23/22 0028 New Bag/Given   ceFEPIme (MAXIPIME) 2 g in sodium chloride 0.9 % 100 mL IVPB 2 g 200 mL/hr   01/23/22 0246 New Bag/Given   metroNIDAZOLE (FLAGYL) IVPB 500 mg 500 mg 100 mL/hr   01/23/22 1148 New Bag/Given   remdesivir 100 mg in sodium chloride 0.9 % 100 mL IVPB 100 mg 200 mL/hr   01/23/22 1244 New Bag/Given   ceFEPIme (MAXIPIME) 2 g in sodium chloride 0.9 % 100 mL IVPB 2 g 200 mL/hr   01/23/22 1357 New Bag/Given  metroNIDAZOLE (FLAGYL) IVPB 500 mg 500 mg 100 mL/hr   01/23/22 1524 New Bag/Given   vancomycin (VANCOREADY) IVPB 750 mg/150 mL 750 mg 150 mL/hr   01/24/22 0044 New Bag/Given   ceFEPIme (MAXIPIME) 2 g in sodium chloride 0.9 % 100 mL IVPB 2 g 200 mL/hr   01/24/22 0159 New Bag/Given   metroNIDAZOLE (FLAGYL) IVPB 500 mg 500 mg 100 mL/hr   01/24/22 1018 New Bag/Given   remdesivir 100 mg in sodium chloride 0.9 % 100 mL IVPB 100 mg 200 mL/hr   01/24/22 1317 New Bag/Given   metroNIDAZOLE (FLAGYL) IVPB 500 mg 500 mg 100 mL/hr   01/24/22  1523 New Bag/Given   ceFEPIme (MAXIPIME) 2 g in sodium chloride 0.9 % 100 mL IVPB 2 g 200 mL/hr         Code Status: Full code   Subjective: Patient states breathing is a little better.  Objective: Blood pressure is rising. Vitals:   01/24/22 0439 01/24/22 0805  BP: (!) 154/93 (!) 185/141  Pulse: 83 68  Resp: 20 17  Temp: 98.5 F (36.9 C) 98.9 F (37.2 C)  SpO2: 98% 93%    Intake/Output Summary (Last 24 hours) at 01/24/2022 1605 Last data filed at 01/24/2022 1018 Gross per 24 hour  Intake 179.54 ml  Output 2200 ml  Net -2020.46 ml    Filed Weights   01/21/22 1300 01/21/22 1643  Weight: 104 kg 104.9 kg   Body mass index is 38.48 kg/m.  Exam:  General: Alert and oriented x3, no acute distress HEENT: Normocephalic and atraumatic, mucous membranes slightly dry Cardiovascular: Irregular rhythm, bradycardia resolved Respiratory: Few Rales Abdomen: Soft, nontender, nondistended, positive bowel sounds Musculoskeletal: No clubbing or cyanosis, trace pitting edema Skin: No skin breaks, tears or lesions Psychiatry: Appropriate, no evidence of psychoses Neurology: No focal deficits  Data Reviewed: Neutropenia improving, white blood cell count 12.5 Disposition:  Status is: Inpatient Remains inpatient appropriate because: Improving neutropenia    Anticipated discharge date: 6/20  Remaining issues to be resolved so that patient can be discharged: Atrial fibrillation discussed with cardiology, resolution of leukopenia   Family Communication: Left message for wife DVT Prophylaxis: Place TED hose Start: 01/21/22 1310    Author: Annita Brod ,MD 01/24/2022 4:05 PM  To reach On-call, see care teams to locate the attending and reach out via [email or web address]. Between 7PM-7AM, please contact night-coverage If you still have difficulty reaching the attending provider, please page the [facility] (Director on Call) for [facility] on amion for assistance.

## 2022-01-25 DIAGNOSIS — U071 COVID-19: Secondary | ICD-10-CM | POA: Diagnosis not present

## 2022-01-25 DIAGNOSIS — A419 Sepsis, unspecified organism: Secondary | ICD-10-CM | POA: Diagnosis not present

## 2022-01-25 DIAGNOSIS — R4182 Altered mental status, unspecified: Secondary | ICD-10-CM

## 2022-01-25 DIAGNOSIS — D709 Neutropenia, unspecified: Secondary | ICD-10-CM | POA: Diagnosis not present

## 2022-01-25 LAB — BASIC METABOLIC PANEL
Anion gap: 6 (ref 5–15)
BUN: 22 mg/dL (ref 8–23)
CO2: 25 mmol/L (ref 22–32)
Calcium: 8.5 mg/dL — ABNORMAL LOW (ref 8.9–10.3)
Chloride: 109 mmol/L (ref 98–111)
Creatinine, Ser: 2.06 mg/dL — ABNORMAL HIGH (ref 0.61–1.24)
GFR, Estimated: 33 mL/min — ABNORMAL LOW (ref >60)
Glucose, Bld: 112 mg/dL — ABNORMAL HIGH (ref 70–99)
Potassium: 3.5 mmol/L (ref 3.5–5.1)
Sodium: 140 mmol/L (ref 135–145)

## 2022-01-25 LAB — CBC
HCT: 34.5 % — ABNORMAL LOW (ref 39.0–52.0)
Hemoglobin: 11.1 g/dL — ABNORMAL LOW (ref 13.0–17.0)
MCH: 30.1 pg (ref 26.0–34.0)
MCHC: 32.2 g/dL (ref 30.0–36.0)
MCV: 93.5 fL (ref 80.0–100.0)
Platelets: 733 10*3/uL — ABNORMAL HIGH (ref 150–400)
RBC: 3.69 MIL/uL — ABNORMAL LOW (ref 4.22–5.81)
RDW: 17.2 % — ABNORMAL HIGH (ref 11.5–15.5)
WBC: 2.6 10*3/uL — ABNORMAL LOW (ref 4.0–10.5)
nRBC: 0.8 % — ABNORMAL HIGH (ref 0.0–0.2)

## 2022-01-25 LAB — GLUCOSE, CAPILLARY: Glucose-Capillary: 104 mg/dL — ABNORMAL HIGH (ref 70–99)

## 2022-01-25 LAB — MAGNESIUM: Magnesium: 2.2 mg/dL (ref 1.7–2.4)

## 2022-01-25 MED ORDER — VANCOMYCIN HCL IN DEXTROSE 750-5 MG/150ML-% IV SOLN
750.0000 mg | INTRAVENOUS | Status: DC
  Filled 2022-01-25: qty 150

## 2022-01-25 NOTE — Progress Notes (Signed)
[facility] Progress Note  Patient: Daniel Kline    FOY:[id number]  DOA: 01/21/2022    Date of Service: the patient was seen and examined on 01/25/2022  Brief hospital course: 76 year old male with past medical history of morbid obesity, diabetes mellitus, hypertension and obstructive sleep apnea presented to the emergency room on 6/16 with confusion.  He had tested positive for COVID that day.  Patient found to be in sepsis with neutropenia.  He was started on Remdisivir.  Neutropenia slowly improving.  Sepsis stabilized.  Over the initial 24 hours, confusion initially improved although he still has intermittent episodes of confusion.  Assessment and Plan: Assessment and Plan: * Neutropenic sepsis ([place]) Suspect his leukopenia is due to methotrexate which has been stopped.  Lymphopenia may be from COVID infection.  Discontinue broad-spectrum antibiotics..  Blood cultures with no growth to date.  Hematology following.  Neutropenia improving.  Smear review unremarkable so far.    Paroxysmal atrial fibrillation (HCC) EKG checked due to bradycardia on 6/17 noted mild bradycardic atrial fibrillation.  Will discuss with cardiology.  Will decrease Coreg but not discontinue  COVID-19 virus infection Completed Remdisivir 6/18.  Patient still having cough, but appears to be more stable.  Stage 3b chronic kidney disease (HCC) - At baseline  Essential hypertension - Resumed home amlodipine 5 mg daily, Coreg 12.5 mg p.o. twice daily with meals, furosemide 40 mg twice daily, losartan 100 mg daily, Imdur 60 mg daily before lunch.  Monitor for hypotension.  Patient has had some episodes of bradycardia and Coreg decreased.  Bradycardia resolved  Chronic pain - Resumed home Norco 5-3 25, every 6 hours.  For moderate pain  OSA (obstructive sleep apnea) - CPAP nightly ordered  Hypercholesterolemia - Rosuvastatin 20 mg daily resumed  Morbid obesity ([place]) - This meets criteria for morbid obesity  based on the presence of 1 or more chronic comorbidities. Patient has obstructive sleep apnea, diabetes mellitus with a BMI of 38. This complicates overall care and prognosis.        Body mass index is 38.48 kg/m.        Consultants: Hematology  Procedures: None  Antimicrobials: Antibiotics Given (last 72 hours)     Date/Time Action Medication Dose Rate   01/23/22 0028 New Bag/Given   ceFEPIme (MAXIPIME) 2 g in sodium chloride 0.9 % 100 mL IVPB 2 g 200 mL/hr   01/23/22 0246 New Bag/Given   metroNIDAZOLE (FLAGYL) IVPB 500 mg 500 mg 100 mL/hr   01/23/22 1148 New Bag/Given   remdesivir 100 mg in sodium chloride 0.9 % 100 mL IVPB 100 mg 200 mL/hr   01/23/22 1244 New Bag/Given   ceFEPIme (MAXIPIME) 2 g in sodium chloride 0.9 % 100 mL IVPB 2 g 200 mL/hr   01/23/22 1357 New Bag/Given   metroNIDAZOLE (FLAGYL) IVPB 500 mg 500 mg 100 mL/hr   01/23/22 1524 New Bag/Given   vancomycin (VANCOREADY) IVPB 750 mg/150 mL 750 mg 150 mL/hr   01/24/22 0044 New Bag/Given   ceFEPIme (MAXIPIME) 2 g in sodium chloride 0.9 % 100 mL IVPB 2 g 200 mL/hr   01/24/22 0159 New Bag/Given   metroNIDAZOLE (FLAGYL) IVPB 500 mg 500 mg 100 mL/hr   01/24/22 1018 New Bag/Given   remdesivir 100 mg in sodium chloride 0.9 % 100 mL IVPB 100 mg 200 mL/hr   01/24/22 1317 New Bag/Given   metroNIDAZOLE (FLAGYL) IVPB 500 mg 500 mg 100 mL/hr   01/24/22 1523 New Bag/Given   ceFEPIme (MAXIPIME) 2 g  in sodium chloride 0.9 % 100 mL IVPB 2 g 200 mL/hr   01/24/22 1632 New Bag/Given   vancomycin (VANCOREADY) IVPB 750 mg/150 mL 750 mg 150 mL/hr   01/25/22 0053 New Bag/Given   ceFEPIme (MAXIPIME) 2 g in sodium chloride 0.9 % 100 mL IVPB 2 g 200 mL/hr   01/25/22 0148 New Bag/Given   metroNIDAZOLE (FLAGYL) IVPB 500 mg 500 mg 100 mL/hr   01/25/22 1238 New Bag/Given   ceFEPIme (MAXIPIME) 2 g in sodium chloride 0.9 % 100 mL IVPB 2 g 200 mL/hr   01/25/22 1403 New Bag/Given   metroNIDAZOLE (FLAGYL) IVPB 500 mg 500 mg 100 mL/hr          Code Status: Full code   Subjective: Patient intermittently confused.  Says breathing is better.  Objective: Blood pressure is rising. Vitals:   01/25/22 0740 01/25/22 1600  BP: 123/75 (!) 149/102  Pulse: 68 87  Resp:    Temp: 97.8 F (36.6 C) 98 F (36.7 C)  SpO2: 95% 96%    Intake/Output Summary (Last 24 hours) at 01/25/2022 1804 Last data filed at 01/25/2022 0751 Gross per 24 hour  Intake 550 ml  Output 1050 ml  Net -500 ml    Filed Weights   01/21/22 1300 01/21/22 1643  Weight: 104 kg 104.9 kg   Body mass index is 38.48 kg/m.  Exam:  General: Alert and oriented x3, no acute distress HEENT: Normocephalic and atraumatic, mucous membranes slightly dry Cardiovascular: Irregular rhythm, bradycardia resolved Respiratory: Scattered rhonchi Abdomen: Soft, nontender, nondistended, positive bowel sounds Musculoskeletal: No clubbing or cyanosis, trace pitting edema Skin: No skin breaks, tears or lesions Psychiatry: Appropriate, no evidence of psychoses Neurology: No focal deficits  Data Reviewed: Neutropenia improving, white blood cell count 2.6 Disposition:  Status is: Inpatient Remains inpatient appropriate because: Improving neutropenia    Anticipated discharge date: 6/20  Remaining issues to be resolved so that patient can be discharged:  resolution of leukopenia   Family Communication: Left message for wife DVT Prophylaxis: Place TED hose Start: 01/21/22 1310    Author: Annita Brod ,MD 01/25/2022 6:04 PM  To reach On-call, see care teams to locate the attending and reach out via [email or web address]. Between 7PM-7AM, please contact night-coverage If you still have difficulty reaching the attending provider, please page the [facility] (Director on Call) for [facility] on amion for assistance.

## 2022-01-25 NOTE — Progress Notes (Signed)
Hematology/Oncology Consult note [facility]  Telephone:([phone] [phone]  Patient Care Team: [name], MD as PCP - General (Internal Medicine) Minna Merritts, MD as PCP - Cardiology (Cardiology)   Name of the patient: Daniel Kline  [id number]  05/30/1946   Date of visit: '@TODAY'$ @  Diagnosis- ***  Chief complaint/ Reason for visit- ***  Heme/Onc history: ***  Interval history- ***  ECOG PS- *** Pain scale- *** Opioid associated constipation- ***  Review of systems- ROS    Allergies  Allergen Reactions   Bee Venom Anaphylaxis   Oxycodone Other (See Comments)    Delusions Delusions   Delusions   Hydromorphone Other (See Comments)    hallucinating   Hydroxychloroquine     Other reaction(s): Other (See Comments) He broke out really badly.   Zolpidem Other (See Comments)     Past Medical History:  Diagnosis Date   Anemia    H/O   Anxiety    Arthritis    Chronic kidney disease    Complication of anesthesia    CTCL (cutaneous T-cell lymphoma) (HCC)    Diabetes mellitus without complication ([place])    Dysplastic nevus 12/19/2017   Right distal lat. forearm near wrist. Severe atypia, close to peripheral margin.   Dysplastic nevus 06/21/2018   Upper back right paraspinal. Severe atypia, peripheral margin involved. Excised 07/11/2018, margins free.   Dysrhythmia    A FLUTTER   Family history of adverse reaction to anesthesia    PT WAS ADOPTED   HLD (hyperlipidemia)    HTN (hypertension)    HTN (hypertension)    Hx of dysplastic nevus 2019   multiple sites   Hx of squamous cell carcinoma 01/18/2018   R mid lateral forearm   MRSA (methicillin resistant Staphylococcus aureus)    after back surgery   OSA (obstructive sleep apnea)    USES BIPAP   Polio    POLIOMYELITIS 01/12/2010   Right arm affected   PONV (postoperative nausea and vomiting)    Squamous cell carcinoma of skin 12/19/2017   Right mid  lat. forearm. SCCis, hypertrophic.     Past Surgical History:  Procedure Laterality Date   BACK SURGERY     LUMBAR   CARDIAC ELECTROPHYSIOLOGY STUDY AND ABLATION  2019   COLONOSCOPY WITH PROPOFOL N/A 04/04/2019   Procedure: COLONOSCOPY WITH PROPOFOL;  Surgeon: Toledo, Benay Pike, MD;  Location: [facility] END[facility];  Service: Gastroenterology;  Laterality: N/A;   I & D EXTREMITY Right 02/01/2020   Procedure: IRRIGATION AND DEBRIDEMENT EXTREMITY with poly exchange;  Surgeon: Dereck Leep, MD;  Location: [facility] ORS;  Service: Orthopedics;  Laterality: Right;   INCISION AND DRAINAGE     BACK-MRSA INFECTION AFTER BACK SURGERY   KNEE ARTHROPLASTY Right 01/28/2020   Procedure: COMPUTER ASSISTED TOTAL KNEE ARTHROPLASTY;  Surgeon: Dereck Leep, MD;  Location: [facility] ORS;  Service: Orthopedics;  Laterality: Right;   MOUTH SURGERY     right elbow surgery     right knee surgery     right shoulder surgery     from polio damage   TONSILLECTOMY     TOTAL HIP ARTHROPLASTY Bilateral 04/2016    Social History   Socioeconomic History   Marital status: Married    Spouse name: Not on file   Number of children: 0   Years of education: Not on file   Highest education level: Not on file  Occupational History   Occupation: Nature conservation officer  Comment: 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   Occupation: Cytogeneticist    Comment: Retired   Occupation: Warden/ranger    Comment: Retired  Tobacco Use   Smoking status: Former    Types: Cigars    Quit date: 09/23/1990    Years since quitting: 31.3   Smokeless tobacco: Former    Quit date: 02/25/2006  Vaping Use   Vaping Use: Never used  Substance and Sexual Activity   Alcohol use: No    Alcohol/week: 0.0 standard drinks of alcohol   Drug use: No   Sexual activity: Yes    Partners: Female  Other Topics Concern   Not on file  Social History Narrative   Has living will   Wife is health care POA---then brother or sister   Would  accept resuscitation attempts but no prolonged ventilation or tube feeds   Social Determinants of Health   Financial Resource Strain: Low Risk  (07/20/2017)   Overall Financial Resource Strain (CARDIA)    Difficulty of Paying Living Expenses: Not hard at all  Food Insecurity: No Food Insecurity (07/20/2017)   Hunger Vital Sign    Worried About Running Out of Food in the Last Year: Never true    [place] in the Last Year: Never true  Transportation Needs: No Transportation Needs (07/20/2017)   PRAPARE - Hydrologist (Medical): No    Lack of Transportation (Non-Medical): No  Physical Activity: Not on file  Stress: Not on file  Social Connections: Unknown (07/20/2017)   Social Connection and Isolation Panel [NHANES]    Frequency of Communication with Friends and Family: Patient refused    Frequency of Social Gatherings with Friends and Family: Patient refused    Attends Religious Services: Patient refused    Active Member of Clubs or Organizations: Patient refused    Attends Archivist Meetings: Patient refused    Marital Status: Patient refused  Intimate Partner Violence: Not At Risk (07/20/2017)   Humiliation, Afraid, Rape, and Kick questionnaire    Fear of Current or Ex-Partner: No    Emotionally Abused: No    Physically Abused: No    Sexually Abused: No    Family History  Adopted: Yes     Current Facility-Administered Medications:    0.9 %  sodium chloride infusion, , Intravenous, PRN, Patrecia Pour, MD, Stopped at 01/23/22 1718   acetaminophen (TYLENOL) tablet 650 mg, 650 mg, Oral, Q6H PRN, 650 mg at 01/24/22 1009 **OR** acetaminophen (TYLENOL) suppository 650 mg, 650 mg, Rectal, Q6H PRN, Cox, Amy N, DO   carvedilol (COREG) tablet 3.125 mg, 3.125 mg, Oral, BID WC, Gevena Barre K, MD, 3.125 mg at 01/25/22 0748   ceFEPIme (MAXIPIME) 2 g in sodium chloride 0.9 % 100 mL IVPB, 2 g, Intravenous, Q12H, Cox, Amy N, DO, Last Rate:  200 mL/hr at 01/25/22 1238, 2 g at 01/25/22 1238   clorazepate (TRANXENE) tablet 7.5 mg, 7.5 mg, Oral, BID PRN, Cox, Amy N, DO   enoxaparin (LOVENOX) injection 52.5 mg, 0.5 mg/kg, Subcutaneous, QHS, Patel, Kishan S, RPH, 52.5 mg at 28/41/32 4401   folic acid (FOLVITE) tablet 1 mg, 1 mg, Oral, Daily, Cox, Amy N, DO, 1 mg at 01/25/22 1008   furosemide (LASIX) tablet 40 mg, 40 mg, Oral, BID, Cox, Amy N, DO, 40 mg at 01/25/22 0748   gabapentin (NEURONTIN) capsule 300 mg, 300 mg, Oral, TID, Cox, Amy N, DO, 300 mg at 01/25/22 1008   guaiFENesin-dextromethorphan (  ROBITUSSIN DM) 100-10 MG/5ML syrup 5 mL, 5 mL, Oral, Q4H PRN, Annita Brod, MD, 5 mL at 01/25/22 1240   hydrALAZINE (APRESOLINE) tablet 25 mg, 25 mg, Oral, Q8H, Annita Brod, MD, 25 mg at 01/25/22 1359   HYDROcodone-acetaminophen (NORCO/VICODIN) 5-325 MG per tablet 1 tablet, 1 tablet, Oral, Q6H PRN, Cox, Amy N, DO, 1 tablet at 01/24/22 2106   hydrOXYzine (ATARAX) tablet 25 mg, 25 mg, Oral, Q6H PRN, Cox, Amy N, DO, 25 mg at 01/24/22 2106   isosorbide mononitrate (IMDUR) 24 hr tablet 60 mg, 60 mg, Oral, QAC lunch, Cox, Amy N, DO, 60 mg at 01/25/22 1231   metroNIDAZOLE (FLAGYL) IVPB 500 mg, 500 mg, Intravenous, Q12H, Cox, Amy N, DO, Last Rate: 100 mL/hr at 01/25/22 1403, 500 mg at 01/25/22 1403   ondansetron (ZOFRAN) tablet 4 mg, 4 mg, Oral, Q6H PRN, 4 mg at 01/24/22 0756 **OR** ondansetron (ZOFRAN) injection 4 mg, 4 mg, Intravenous, Q6H PRN, Cox, Amy N, DO   polyethylene glycol (MIRALAX / GLYCOLAX) packet 17 g, 17 g, Oral, Daily PRN, Cox, Amy N, DO   rosuvastatin (CRESTOR) tablet 20 mg, 20 mg, Oral, Daily, Cox, Amy N, DO, 20 mg at 01/25/22 1008   timolol (TIMOPTIC) 0.5 % ophthalmic solution 1 drop, 1 drop, Both Eyes, BID, Dallie Piles, RPH, 1 drop at 01/25/22 1009   vancomycin (VANCOCIN) IVPB 750 mg/150 ml premix, 750 mg, Intravenous, Q24H, Vira Blanco, [facility]  Physical exam:  Vitals:   01/24/22 2104 01/25/22 0500 01/25/22 0740  01/25/22 1600  BP: (!) 142/63 (!) 149/75 123/75 (!) 149/102  Pulse: 90 69 68 87  Resp: 17 18    Temp: 98.2 F (36.8 C) 98.2 F (36.8 C) 97.8 F (36.6 C) 98 F (36.7 C)  TempSrc: Oral Oral Oral Oral  SpO2: 96% 96% 95% 96%  Weight:      Height:       Physical Exam      Latest Ref Rng & Units 01/25/2022    5:29 AM  CMP  Glucose 70 - 99 mg/dL 112   BUN 8 - 23 mg/dL 22   Creatinine 0.61 - 1.24 mg/dL 2.06   Sodium 135 - 145 mmol/L 140   Potassium 3.5 - 5.1 mmol/L 3.5   Chloride 98 - 111 mmol/L 109   CO2 22 - 32 mmol/L 25   Calcium 8.9 - 10.3 mg/dL 8.5       Latest Ref Rng & Units 01/25/2022    5:29 AM  CBC  WBC 4.0 - 10.5 K/uL 2.6   Hemoglobin 13.0 - 17.0 g/dL 11.1   Hematocrit 39.0 - 52.0 % 34.5   Platelets 150 - 400 K/uL 733     '@IMAGES'$ @  DG Abd Portable 1V  Result Date: 01/22/2022 CLINICAL DATA:  Constipation, distended abdomen EXAM: PORTABLE ABDOMEN - 1 VIEW COMPARISON:  2012 FINDINGS: Air-filled loops of small and large bowel without significant dilatation. No significant stool burden. Postoperative changes of the lumbar spine and bilateral hips. IMPRESSION: Air-filled bowel loops without significant dilatation. No significant stool burden. Electronically Signed   By: Macy Mis M.D.   On: 01/22/2022 12:02   DG Chest 2 View  Result Date: 01/21/2022 CLINICAL DATA:  SOB EXAM: CHEST - 2 VIEW COMPARISON:  Chest radiograph 08/15/2017. FINDINGS: Similar enlargement the cardiac silhouette. No consolidation. No visible pleural effusions or pneumothorax. No acute osseous abnormality. IMPRESSION: Similar cardiomegaly without evidence of acute cardiopulmonary disease. Electronically Signed   By: Margaretha Sheffield  M.D.   On: 01/21/2022 11:43   CT Head Wo Contrast  Result Date: 01/21/2022 CLINICAL DATA:  76 year old male with history of mental status change. EXAM: CT HEAD WITHOUT CONTRAST TECHNIQUE: Contiguous axial images were obtained from the base of the skull through the  vertex without intravenous contrast. RADIATION DOSE REDUCTION: This exam was performed according to the departmental dose-optimization program which includes automated exposure control, adjustment of the mA and/or kV according to patient size and/or use of iterative reconstruction technique. COMPARISON:  No priors. FINDINGS: Brain: Mild cerebral atrophy. Patchy and confluent areas of decreased attenuation are noted throughout the deep and periventricular white matter of the cerebral hemispheres bilaterally, compatible with chronic microvascular ischemic disease. More well-defined area of low attenuation in the right occipital lobe, presumably an area of mild encephalomalacia related to remote right PCA territory infarct. No evidence of acute infarction, hemorrhage, hydrocephalus, extra-axial collection or mass lesion/mass effect. Vascular: No hyperdense vessel or unexpected calcification. Skull: Normal. Negative for fracture or focal lesion. Sinuses/Orbits: No acute finding. Other: None. IMPRESSION: 1. No acute intracranial abnormalities. 2. 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 Electronically Signed   By: Vinnie Langton M.D.   On: 01/21/2022 11:29     Assessment and plan- Patient is a 76 y.o. male ***   Visit Diagnosis 1. Febrile neutropenia (HCC)   2. Altered mental status, unspecified altered mental status type   3. COVID-19   4. Neutropenic sepsis ([place])      Dr. Randa Evens, MD, MPH [facility] [phone] 01/25/2022 4:03 PM

## 2022-01-26 DIAGNOSIS — A419 Sepsis, unspecified organism: Secondary | ICD-10-CM | POA: Diagnosis not present

## 2022-01-26 DIAGNOSIS — U071 COVID-19: Secondary | ICD-10-CM | POA: Diagnosis not present

## 2022-01-26 DIAGNOSIS — D709 Neutropenia, unspecified: Secondary | ICD-10-CM | POA: Diagnosis not present

## 2022-01-26 LAB — CULTURE, BLOOD (ROUTINE X 2)
Culture: NO GROWTH
Culture: NO GROWTH
Special Requests: ADEQUATE

## 2022-01-26 LAB — COMP PANEL: LEUKEMIA/LYMPHOMA: Immunophenotypic Profile: 0

## 2022-01-26 LAB — CBC
HCT: 35.4 % — ABNORMAL LOW (ref 39.0–52.0)
Hemoglobin: 11.6 g/dL — ABNORMAL LOW (ref 13.0–17.0)
MCH: 30.4 pg (ref 26.0–34.0)
MCHC: 32.8 g/dL (ref 30.0–36.0)
MCV: 92.7 fL (ref 80.0–100.0)
Platelets: 823 10*3/uL — ABNORMAL HIGH (ref 150–400)
RBC: 3.82 MIL/uL — ABNORMAL LOW (ref 4.22–5.81)
RDW: 17.4 % — ABNORMAL HIGH (ref 11.5–15.5)
WBC: 3.7 10*3/uL — ABNORMAL LOW (ref 4.0–10.5)
nRBC: 1.3 % — ABNORMAL HIGH (ref 0.0–0.2)

## 2022-01-26 NOTE — Care Management Important Message (Signed)
Important Message  Patient Details  Name: Daniel Kline MRN: [id number] Date of Birth: Jun 17, 1946   Medicare Important Message Given:  Yes  I reviewed the Important Message from Medicare with the patient by phone as he is an isolation room.  He stated he understood his rights and didn't need a copy. I wished him and his wife well and thanked him for his time.   Daniel Kline 01/26/2022, 2:14 PM

## 2022-01-26 NOTE — Progress Notes (Signed)
[facility] Progress Note  Patient: Daniel Kline    YIA:[id number]  DOA: 01/21/2022    Date of Service: the patient was seen and examined on 01/26/2022  Brief hospital course: 76 year old male with past medical history of morbid obesity, diabetes mellitus, hypertension and obstructive sleep apnea presented to the emergency room on 6/16 with confusion.  He had tested positive for COVID that day.  Patient found to be in sepsis with neutropenia.  He was started on Remdisivir.  Neutropenia slowly improving.  Sepsis stabilized.  Over the initial 24 hours, confusion initially improved although he still has intermittent episodes of confusion.  Assessment and Plan: Assessment and Plan: * Neutropenic sepsis (HCC)-resolved as of 01/26/2022 Suspect his leukopenia is due to methotrexate which has been stopped.  Lymphopenia may be from COVID infection.  Discontinue broad-spectrum antibiotics..  Blood cultures with no growth to date.  Followed by hematology.  White blood cell count now up to 3.7 and neutropenia felt to have resolved.  Much improved.  If neutropenia recurs, outpatient follow-up and bone marrow biopsy with hematology.  Paroxysmal atrial fibrillation (HCC) EKG checked due to bradycardia on 6/17 noted mild bradycardic atrial fibrillation.  Will discuss with cardiology.  Will decrease Coreg but not discontinue  COVID-19 virus infection Completed Remdisivir 6/18.  Patient still having cough, but appears to be more stable.  Stage 3b chronic kidney disease (HCC) - At baseline  Essential hypertension - Resumed home amlodipine 5 mg daily, Coreg 12.5 mg p.o. twice daily with meals, furosemide 40 mg twice daily, losartan 100 mg daily, Imdur 60 mg daily before lunch.  Monitor for hypotension.  Patient has had some episodes of bradycardia and Coreg decreased.  Bradycardia resolved  Chronic pain - Resumed home Norco 5-3 25, every 6 hours.  For moderate pain  OSA (obstructive sleep apnea) -  CPAP nightly ordered  Hypercholesterolemia - Rosuvastatin 20 mg daily resumed  Morbid obesity ([facility]) - This meets criteria for morbid obesity based on the presence of 1 or more chronic comorbidities. Patient has obstructive sleep apnea, diabetes mellitus with a BMI of 38. This complicates overall care and prognosis.        Body mass index is 38.48 kg/m.        Consultants: Hematology  Procedures: None  Antimicrobials: Antibiotics Given (last 72 hours)     Date/Time Action Medication Dose Rate   01/24/22 0044 New Bag/Given   ceFEPIme (MAXIPIME) 2 g in sodium chloride 0.9 % 100 mL IVPB 2 g 200 mL/hr   01/24/22 0159 New Bag/Given   metroNIDAZOLE (FLAGYL) IVPB 500 mg 500 mg 100 mL/hr   01/24/22 1018 New Bag/Given   remdesivir 100 mg in sodium chloride 0.9 % 100 mL IVPB 100 mg 200 mL/hr   01/24/22 1317 New Bag/Given   metroNIDAZOLE (FLAGYL) IVPB 500 mg 500 mg 100 mL/hr   01/24/22 1523 New Bag/Given   ceFEPIme (MAXIPIME) 2 g in sodium chloride 0.9 % 100 mL IVPB 2 g 200 mL/hr   01/24/22 1632 New Bag/Given   vancomycin (VANCOREADY) IVPB 750 mg/150 mL 750 mg 150 mL/hr   01/25/22 0053 New Bag/Given   ceFEPIme (MAXIPIME) 2 g in sodium chloride 0.9 % 100 mL IVPB 2 g 200 mL/hr   01/25/22 0148 New Bag/Given   metroNIDAZOLE (FLAGYL) IVPB 500 mg 500 mg 100 mL/hr   01/25/22 1238 New Bag/Given   ceFEPIme (MAXIPIME) 2 g in sodium chloride 0.9 % 100 mL IVPB 2 g 200 mL/hr   01/25/22 1403 New  Bag/Given   metroNIDAZOLE (FLAGYL) IVPB 500 mg 500 mg 100 mL/hr         Code Status: Full code   Subjective: Patient feels better, less cough.  Objective: Blood pressure is rising. Vitals:   01/26/22 0359 01/26/22 0900  BP: (!) 142/83 (!) 168/72  Pulse: 92 85  Resp: 20 18  Temp: 98.4 F (36.9 C) 97.6 F (36.4 C)  SpO2: 100% 95%    Intake/Output Summary (Last 24 hours) at 01/26/2022 1539 Last data filed at 01/26/2022 1300 Gross per 24 hour  Intake --  Output 1225 ml  Net -1225  ml    Filed Weights   01/21/22 1300 01/21/22 1643  Weight: 104 kg 104.9 kg   Body mass index is 38.48 kg/m.  Exam:  General: Alert and oriented x3, no acute distress HEENT: Normocephalic and atraumatic, mucous membranes slightly dry Cardiovascular: Irregular rhythm, bradycardia resolved Respiratory: Occasional rale, much more clear Abdomen: Soft, nontender, nondistended, positive bowel sounds Musculoskeletal: No clubbing or cyanosis, trace pitting edema Skin: No skin breaks, tears or lesions Psychiatry: Appropriate, no evidence of psychoses Neurology: No focal deficits  Data Reviewed: Neutropenia improving, white blood cell count up to 3.7 Disposition:  Status is: Inpatient Remains inpatient appropriate because: PT evaluation    Anticipated discharge date: 6/21   Family Communication: Left message for wife DVT Prophylaxis: Place TED hose Start: 01/21/22 1310    Author:  K  ,MD 01/26/2022 3:39 PM  To reach On-call, see care teams to locate the attending and reach out via [email or web address]. Between 7PM-7AM, please contact night-coverage If you still have difficulty reaching the attending provider, please page the DOC (Director on Call) for [facility] on amion for assistance.  

## 2022-01-27 ENCOUNTER — Encounter: Payer: Self-pay | Admitting: Internal Medicine

## 2022-01-27 ENCOUNTER — Other Ambulatory Visit (HOSPITAL_COMMUNITY): Payer: Self-pay

## 2022-01-27 MED ORDER — APIXABAN 5 MG PO TABS: 5.0000 mg | ORAL_TABLET | Freq: Two times a day (BID) | ORAL | Status: DC

## 2022-01-27 MED ORDER — CARVEDILOL 3.125 MG PO TABS: 3.1250 mg | ORAL_TABLET | Freq: Two times a day (BID) | ORAL | 0 refills | Status: DC

## 2022-01-27 MED ORDER — APIXABAN 5 MG PO TABS: 5.0000 mg | ORAL_TABLET | Freq: Two times a day (BID) | ORAL | 0 refills | Status: DC

## 2022-01-27 NOTE — Evaluation (Signed)
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  He had tested positive for COVID that day. MD assessment includes Neutropenic sepsis, Paroxysmal atrial fibrillation, and COVID-19 infection   Clinical Impression   Mr. Bellanca presents with generalized weakness and limited endurance. He nevertheless appears to be at or near his baseline for fxl mobility. He is able to perform transfers, ambulation, grooming in standing, toileting, all with Mod I-SUPV, able to reach beyond BOS w/ no LOB. Provided educ re: falls prevention, recovery from COVID, energy conservation strategies. Pt and therapist in agreement that no further OT services required at this point, will sign off.    Recommendations for follow up therapy are one component of a multi-disciplinary discharge planning process, led by the attending physician.  Recommendations may be updated based on patient status, additional functional criteria and insurance authorization.   Follow Up Recommendations  No OT follow up    Assistance Recommended at Discharge PRN  Patient can return home with the following      Functional Status Assessment  Patient has had a recent decline in their functional status and demonstrates the ability to make significant improvements in function in a reasonable and predictable amount of time.  Equipment Recommendations       Recommendations for Other Services       Precautions / Restrictions Precautions Precautions: Fall Restrictions Weight Bearing Restrictions: No      Mobility Bed Mobility               General bed mobility comments: pt in recliner at start/end of session     Transfers Overall transfer level: Needs assistance Equipment used: Rolling walker (2 wheels) Transfers: Sit to/from Stand Sit to Stand: Supervision                  Balance Overall balance assessment: Needs assistance Sitting-balance support: Feet supported Sitting balance-Leahy Scale: Normal     Standing balance support: No upper extremity supported, Bilateral upper extremity supported Standing balance-Leahy Scale: Good                             ADL either performed or assessed with clinical judgement   ADL Overall ADL's : Modified independent;At baseline                                             Vision         Perception     Praxis      Pertinent Vitals/Pain Pain Assessment Pain Assessment: No/denies pain     Hand Dominance Left   Extremity/Trunk Assessment Upper Extremity Assessment Upper Extremity Assessment: RUE deficits/detail RUE Deficits / Details: 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9 LUE Deficits / Details: WFL   Lower Extremity Assessment Lower Extremity Assessment: Overall WFL for tasks assessed       Communication Communication Communication: No difficulties   Cognition Arousal/Alertness: Awake/alert Behavior During Therapy: WFL for tasks assessed/performed Overall Cognitive Status: Within Functional Limits for tasks assessed  General Comments: A&O x 4; pleasant and talkative     General Comments       Exercises Other Exercises Other Exercises: Educ re: falls prevention, rehab process, DC recs   Shoulder Instructions      Home Living Family/patient expects to be discharged to:: Private residence Living Arrangements: Spouse/significant other Available Help at Discharge: Family;Available 24 hours/day Type of Home: House Home Access: Stairs to enter [employer] of Steps: 4 Entrance Stairs-Rails: Can reach both Home Layout: One level      Bathroom Shower/Tub: Teacher, early years/pre: Standard Bathroom Accessibility: No   Home Equipment: Conservation officer, nature (2 wheels);BSC/3in1;Tub bench;Cane - single point          Prior Functioning/Environment Prior Level of Function : Independent/Modified Independent             Mobility Comments: Occasional use of SPC, 1 fall in previous 6 months, drives, works part-time ADLs Comments: IND w/ ADL, IADL        OT Problem List: Decreased strength;Decreased activity tolerance      OT Treatment/Interventions:      OT Goals(Current goals can be found in the care plan section) Acute Rehab OT Goals Patient Stated Goal: to feel better OT Goal Formulation: With patient Time For Goal Achievement: 02/10/22 Potential to Achieve Goals: Good  OT Frequency:      Co-evaluation              AM-PAC OT "6 Clicks" Daily Activity     Outcome Measure Help from another person eating meals?: None Help from another person taking care of personal grooming?: None Help from another person toileting, which includes using toliet, bedpan, or urinal?: None Help from another person bathing (including washing, rinsing, drying)?: None Help from another person to put on and taking off regular upper body clothing?: None Help from another person to put on and taking off regular lower body clothing?: None 6 Click Score: 24   End of Session Equipment Utilized During Treatment: Rolling walker (2 wheels)  Activity Tolerance: Patient tolerated treatment well Patient left: in chair;with call bell/phone within reach  OT Visit Diagnosis: Muscle weakness (generalized) (M62.81)                Time: 3428-7681 OT Time Calculation (min): 21 min Charges:  OT General Charges $OT Visit: 1 Visit OT Evaluation $OT Eval Low Complexity: 1 Low OT Treatments $Self Care/Home Management : 8-22 mins Josiah Lobo, PhD, MS, OTR/L 01/27/22, 1:00 PM

## 2022-01-27 NOTE — TOC Benefit Eligibility Note (Signed)
Patient Advocate Encounter  Insurance verification completed.    The patient is currently admitted and upon discharge could be taking Eliquis 5 mg.  The current 30 day co-pay is, $47.00.   The patient is insured through [employer] Medicare Part D    Madeeha Costantino, CPhT Pharmacy Patient Advocate Specialist [facility] Patient Advocate Team Direct Number: [phone]  Fax: [phone]        

## 2022-01-27 NOTE — Discharge Summary (Signed)
Physician Discharge Summary   Patient: Daniel Kline MRN: [id number] DOB: [birth year]  Admit date:     01/21/2022  Discharge date: 01/27/22  Discharge Physician: Jennye Boroughs   PCP: [name], MD   Recommendations at discharge:   Check CBC and renal function with PCP within 1 week of discharge.  Follow-up with PCP for referral to see general surgeon for evaluation of umbilical hernia if desired  Discharge Diagnoses: Active Problems:   COVID-19 virus infection   Paroxysmal atrial fibrillation (HCC)   Stage 3b chronic kidney disease ([place])   Essential hypertension   OSA (obstructive sleep apnea)   Chronic pain   Hypercholesterolemia   GERD (gastroesophageal reflux disease)   Morbid obesity (HCC)   Anti-cyclic citrullinated peptide antibody positive   Secondary hyperparathyroidism of renal origin ([place])  Principal Problem (Resolved):   Neutropenic sepsis [facility])  Hospital Course: Mr. [name] is a 76 year old male with past medical history of morbid obesity, CKD stage IIIb, diabetes mellitus, hypertension and obstructive sleep apnea, history of occipital stroke (patient was not aware of prior stroke but this was noted on CT head on this admission).  He presented to the emergency room on 6/16 2023 with confusion.  He had tested positive for COVID that day.   He was admitted to the hospital for neutropenic sepsis secondary to COVID-19 infection.  He was treated with IV fluids, empiric IV antibiotics and IV remdesivir.  Hematologist was consulted to assist with management.  He was on methotrexate but this was discontinued because of neutropenia.  He was also found to have paroxysmal atrial fibrillation.  CHA2DS2-VASc score was 6.  Risks, benefits and alternatives to long-term anticoagulation for stroke prophylaxis were discussed.  Patient elected to use Eliquis for stroke prophylaxis.       Consultants: Hematologist Procedures performed: None Disposition: Home Diet  recommendation:  Discharge Diet Orders (From admission, onward)     Start     Ordered   01/27/22 0000  Diet - low sodium heart healthy        01/27/22 1317           Cardiac and Carb modified diet DISCHARGE MEDICATION: Allergies as of 01/27/2022       Reactions   Bee Venom Anaphylaxis   Oxycodone Other (See Comments)   Delusions Delusions  Delusions   Hydromorphone Other (See Comments)   hallucinating   Hydroxychloroquine    Other reaction(s): Other (See Comments) He broke out really badly.   Zolpidem Other (See Comments)        Medication List     STOP taking these medications    methotrexate 2.5 MG tablet       TAKE these medications    amLODipine 5 MG tablet Commonly known as: NORVASC Take 1 tablet (5 mg total) by mouth daily.   apixaban 5 MG Tabs tablet Commonly known as: ELIQUIS Take 1 tablet (5 mg total) by mouth 2 (two) times daily.   carvedilol 3.125 MG tablet Commonly known as: COREG Take 1 tablet (3.125 mg total) by mouth 2 (two) times daily with a meal. TAKE ONE (1) TABLET BY MOUTH TWO TIMES PER DAY What changed:  medication strength how much to take how to take this when to take this   clobetasol cream 0.05 % Commonly known as: TEMOVATE Apply 1 application topically 2 (two) times daily as needed (irritation).   clorazepate 7.5 MG tablet Commonly known as: TRANXENE Take 1 tablet (7.5 mg total) by mouth 2 (  two) times daily as needed for anxiety.   diclofenac Sodium 1 % Gel Commonly known as: VOLTAREN APPLY 2 GRAMS TOPICALLY FOUR TIMES DAILY   EPINEPHrine 0.3 mg/0.3 mL Soaj injection Commonly known as: EPI-PEN Inject 0.3 mLs (0.3 mg total) into the muscle as needed for anaphylaxis.   folic acid 1 MG tablet Commonly known as: FOLVITE Take 1 mg by mouth daily.   furosemide 40 MG tablet Commonly known as: LASIX Take 1 tablet (40 mg total) by mouth 2 (two) times daily.   gabapentin 300 MG capsule Commonly known as:  NEURONTIN TAKE 1 CAPSULE BY MOUTH 3  TIMES DAILY   glipiZIDE 5 MG tablet Commonly known as: GLUCOTROL TAKE ONE-HALF TABLET BY  MOUTH TWICE DAILY BEFORE  MEALS   HYDROcodone-acetaminophen 5-325 MG tablet Commonly known as: NORCO/VICODIN TAKE 1 TABLET BY MOUTH EVERY SIX HOURS AS NEEDED FOR PAIN   hydrOXYzine 25 MG tablet Commonly known as: ATARAX TAKE ONE TABLET EVERY EIGHT HOURS AS NEEDED   IRON PO Take 1 tablet by mouth at bedtime.   isosorbide mononitrate 60 MG 24 hr tablet Commonly known as: IMDUR Take 1 tablet (60 mg total) by mouth daily before lunch.   ketoconazole 2 % cream Commonly known as: NIZORAL Apply twice daily to under arm and feet until clear then one additional week   losartan 100 MG tablet Commonly known as: COZAAR Take 1 tablet (100 mg total) by mouth daily.   MAGNESIUM PO Take by mouth.   Medical Compression Stockings Misc 2 Units by Does not apply route daily.   metFORMIN 500 MG tablet Commonly known as: GLUCOPHAGE TAKE 1 TABLET BY MOUTH TWICE DAILY   Multivital tablet Take 1 tablet by mouth daily.   mupirocin ointment 2 % Commonly known as: BACTROBAN APPLY TO CRUSTS ON THE LEFT LOWER LEG AND LEFT AXILLA (UNDER ARM) EVERY DAY   OneTouch Ultra test strip Generic drug: glucose blood CHECK BLOOD SUGAR 3 TIMES DAILY AS DIRECTED   rosuvastatin 20 MG tablet Commonly known as: CRESTOR TAKE 1 TABLET BY MOUTH  DAILY   timolol 0.5 % ophthalmic solution Commonly known as: TIMOPTIC Place 1 drop into both eyes 2 (two) times daily.   triamcinolone cream 0.1 % Commonly known as: KENALOG Apply to aa L axilla and L lower leg QD PRN.   Victoza 18 MG/3ML Sopn Generic drug: liraglutide Inject 1.2 mg into the skin daily.   VITAMIN D-3 PO Take 10,000 Units by mouth daily.               Discharge Care Instructions  (From admission, onward)           Start     Ordered   01/27/22 0000  Discharge wound care:       Comments: Keep wound  on left lateral leg clean and dry.   01/27/22 1317            Discharge Exam: Filed Weights   01/21/22 1300 01/21/22 1643  Weight: 104 kg 104.9 kg   GEN: NAD SKIN: Warm and dry.  Chronic wound on left lateral leg EYES: EOMI ENT: MMM CV: RRR PULM: CTA B ABD: soft, obese, umbilical hernia, NT, +BS CNS: AAO x 3, non focal EXT: No edema or tenderness   Condition at discharge: good  The results of significant diagnostics from this hospitalization (including imaging, microbiology, ancillary and laboratory) are listed below for reference.   Imaging Studies: DG Abd Portable 1V  Result Date: 01/22/2022 CLINICAL DATA:  Constipation, distended abdomen EXAM: PORTABLE ABDOMEN - 1 VIEW COMPARISON:  2012 FINDINGS: Air-filled loops of small and large bowel without significant dilatation. No significant stool burden. Postoperative changes of the lumbar spine and bilateral hips. IMPRESSION: Air-filled bowel loops without significant dilatation. No significant stool burden. Electronically Signed   By: Macy Mis M.D.   On: 01/22/2022 12:02   DG Chest 2 View  Result Date: 01/21/2022 CLINICAL DATA:  SOB EXAM: CHEST - 2 VIEW COMPARISON:  Chest radiograph 08/15/2017. FINDINGS: Similar enlargement the cardiac silhouette. No consolidation. No visible pleural effusions or pneumothorax. No acute osseous abnormality. IMPRESSION: Similar cardiomegaly without evidence of acute cardiopulmonary disease. Electronically Signed   By: Margaretha Sheffield M.D.   On: 01/21/2022 11:43   CT Head Wo Contrast  Result Date: 01/21/2022 CLINICAL DATA:  76 year old male with history of mental status change. EXAM: CT HEAD WITHOUT CONTRAST TECHNIQUE: Contiguous axial images were obtained from the base of the skull through the vertex without intravenous contrast. RADIATION DOSE REDUCTION: This exam was performed according to the departmental dose-optimization program which includes automated exposure control, adjustment of  the mA and/or kV according to patient size and/or use of iterative reconstruction technique. COMPARISON:  No priors. FINDINGS: Brain: Mild cerebral atrophy. Patchy and confluent areas of decreased attenuation are noted throughout the deep and periventricular white matter of the cerebral hemispheres bilaterally, compatible with chronic microvascular ischemic disease. More well-defined area of low attenuation in the right occipital lobe, presumably an area of mild encephalomalacia related to remote right PCA territory infarct. No evidence of acute infarction, hemorrhage, hydrocephalus, extra-axial collection or mass lesion/mass effect. Vascular: No hyperdense vessel or unexpected calcification. Skull: Normal. Negative for fracture or focal lesion. Sinuses/Orbits: No acute finding. Other: None. IMPRESSION: 1. No acute intracranial abnormalities. 2. 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 Electronically Signed   By: Vinnie Langton M.D.   On: 01/21/2022 11:29    Microbiology: Results for orders placed or performed during the hospital encounter of 01/21/22  SARS Coronavirus 2 by RT PCR (hospital order, performed in [facility] hospital lab) *cepheid single result test* Anterior Nasal Swab     Status: Abnormal   Collection Time: 01/21/22 11:00 AM   Specimen: Anterior Nasal Swab  Result Value Ref Range Status   SARS Coronavirus 2 by RT PCR POSITIVE (A) NEGATIVE Final    Comment: (NOTE) SARS-CoV-2 target nucleic acids are DETECTED  SARS-CoV-2 RNA is generally detectable in upper respiratory specimens  during the acute phase of infection.  Positive results are indicative  of the presence of the identified virus, but do not rule out bacterial infection or co-infection with other pathogens not detected by the test.  Clinical correlation with patient history and  other  diagnostic information is necessary to determine patient infection status.  The expected result is negative.  Fact Sheet for Patients:   https://www.patel.info/   Fact Sheet for Healthcare Providers:   https://hall.com/    This test is not yet approved or cleared by the [place] FDA and  has been authorized for detection and/or diagnosis of SARS-CoV-2 by FDA under an Emergency Use Authorization (EUA).  This EUA will remain in effect (meaning this test can be used) for the duration of  the COVID-19 declaration under Section 564(b)(1)  of the Act, 21 U.S.C. section 360-bbb-3(b)(1), unless the authorization is terminated or revoked sooner.  Performed at [facility], [address] [place], [place] [address]   Culture, blood (routine x 2)     Status: None   Collection Time: 01/21/22 12:53 PM   Specimen: BLOOD  Result Value Ref Range Status   Specimen Description BLOOD RIGHT [facility]  Final   Special Requests   Final    BOTTLES DRAWN AEROBIC AND ANAEROBIC Blood Culture results may not be optimal due to an inadequate volume of blood received in culture bottles   Culture   Final    NO GROWTH 5 DAYS Performed at [facility], [address] [place], [place] [address]    Report Status 01/26/2022 FINAL  Final  Culture, blood (routine x 2)     Status: None   Collection Time: 01/21/22 12:53 PM   Specimen: BLOOD  Result Value Ref Range Status   Specimen Description BLOOD LEFT [facility]  Final   Special Requests   Final    BOTTLES DRAWN AEROBIC AND ANAEROBIC Blood Culture adequate volume   Culture   Final    NO GROWTH 5 DAYS Performed at [facility], [address] [place], [place] [address]    Report Status 01/26/2022 FINAL  Final  MRSA Next Gen by PCR, Nasal     Status: None   Collection Time: 01/21/22  2:39 PM   Specimen: Nasal Swab  Result Value Ref Range Status   MRSA by PCR Next Gen NOT DETECTED NOT  DETECTED Final    Comment: (NOTE) The GeneXpert MRSA Assay (FDA approved for NASAL specimens only), is one component of a comprehensive MRSA colonization surveillance program. It is not intended to diagnose MRSA infection nor to guide or monitor treatment for MRSA infections. Test performance is not FDA approved in patients less than 50 years old. Performed at [facility], [address] [place], [place] [address]     Labs: CBC: Recent Labs  Lab 01/21/22 1100 01/22/22 0459 01/23/22 0541 01/24/22 0632 01/25/22 0529 01/26/22 1035  WBC 1.3* 1.7* 2.2* 2.5* 2.6* 3.7*  NEUTROABS 0.4*  --  0.5* 0.6*  --   --   HGB 11.1* 10.5* 11.3* 11.8* 11.1* 11.6*  HCT 34.1* 32.3* 34.8* 35.9* 34.5* 35.4*  MCV 95.0 95.0 94.6 94.5 93.5 92.7  PLT 416* 435* 607* 693* 733* 563*   Basic Metabolic Panel: Recent Labs  Lab 01/21/22 1100 01/22/22 0459 01/23/22 0541 01/24/22 0632 01/25/22 0528 01/25/22 0529  NA 135 137  --  137  --  140  K 4.0 3.9  --  3.6  --  3.5  CL 101 108  --  105  --  109  CO2 26 23  --  25  --  25  GLUCOSE 154* 103*  --  137*  --  112*  BUN 15 13  --  26*  --  22  CREATININE 2.04* 1.55* 2.10* 2.13*  --  2.06*  CALCIUM 9.2 7.4*  --  8.5*  --  8.5*  MG  --   --   --   --  2.2  --    Liver Function Tests: Recent Labs  Lab 01/21/22 1100  AST 25  ALT 17  ALKPHOS 55  BILITOT 0.9  PROT 7.2  ALBUMIN 4.0   CBG: Recent Labs  Lab 01/23/22 0839 01/25/22 0738  GLUCAP 89 104*    Discharge time spent: greater than 30 minutes.  Signed: Jennye Boroughs, MD [facility] 01/27/2022

## 2022-01-27 NOTE — TOC Transition Note (Signed)
Transition of Care [facility]) - CM/SW Discharge Note   Patient Details  Name: Daniel Kline MRN: [id number] Date of Birth: 11-Apr-1946  [facility] of [facility]) CM/SW Contact:  Donnelly Angelica, LCSW Phone Number: 01/27/2022, 1:40 PM   Clinical Narrative:    Pt has orders to discharge today. No further concerns. CSW signing off.     Final next level of care: Home/Self Care Barriers to Discharge: Barriers Resolved   Patient Goals and CMS Choice        Discharge Placement                    Patient and family notified of of transfer: 01/27/22  Discharge Plan and Services                                     Social Determinants of Health (SDOH) Interventions     Readmission Risk Interventions    01/27/2022   12:27 PM  Readmission Risk Prevention Plan  Transportation Screening Complete  PCP or Specialist Appt within 3-5 Days Complete  HRI or [place] Complete  Social Work Consult for [place] Planning/Counseling Complete  Palliative Care Screening Complete  Medication Review Press photographer) Complete

## 2022-01-27 NOTE — Evaluation (Signed)
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  He had tested positive for COVID that day. MD assessment includes Neutropenic sepsis, Paroxysmal atrial fibrillation, and COVID-19 infection  Clinical Impression  Pt was pleasant and motivated to participate during the session and put forth good effort throughout. Pt is able to complete sit to stand transfer w/ CGA and good stability. Pt was able to ambulate in room 33f using no AD with slow but steady gait and no LOB. HR and SpO2 WNL on room air before and after activity with no other adverse symptoms response reported/observed. Pt reports he feels that he is almost close to baseline but still generally weak. Pt will benefit from HHPT upon discharge to safely address deficits listed in patient problem list for decreased caregiver assistance and eventual return to PLOF.       Recommendations for follow up therapy are one component of a multi-disciplinary discharge planning process, led by the attending physician.  Recommendations may be updated based on patient status, additional functional criteria and insurance authorization.  Follow Up Recommendations Home health PT      Assistance Recommended at Discharge Intermittent Supervision/Assistance  Patient can return home with the following  A little help with walking and/or transfers;A little help with bathing/dressing/bathroom;Assistance with cooking/housework;Assist for transportation;Help with stairs or ramp for entrance    Equipment Recommendations None recommended by PT  Recommendations for Other Services       Functional Status Assessment Patient has had a recent decline in  their functional status and demonstrates the ability to make significant improvements in function in a reasonable and predictable amount of time.     Precautions / Restrictions Precautions Precautions: Fall Restrictions Weight Bearing Restrictions: No      Mobility  Bed Mobility               General bed mobility comments: pt in recliner at start/end of session    Transfers Overall transfer level: Needs assistance Equipment used: None Transfers: Sit to/from Stand Sit to Stand: Min guard                Ambulation/Gait Ambulation/Gait assistance: Min guard Gait Distance (Feet): 50 Feet Assistive device: None Gait Pattern/deviations: Step-through pattern, Decreased step length - right, Decreased step length - left Gait velocity: decreased     General Gait Details: slow but steady gait w/ no LOB  Stairs            Wheelchair Mobility    Modified Rankin (Stroke Patients Only)       Balance Overall balance assessment: Needs assistance Sitting-balance support: Feet supported Sitting balance-Leahy Scale: Good     Standing balance support: No upper extremity supported Standing balance-Leahy Scale: Fair                               Pertinent Vitals/Pain Pain Assessment Pain Assessment: 0-10 Pain Score: 3  Pain Location: L knee Pain Descriptors / Indicators: Aching Pain Intervention(s): Monitored during session, Premedicated before session, Repositioned    Home Living Family/patient expects to be discharged to:: Private residence Living Arrangements: Spouse/significant other (wife) Available Help at Discharge: Family;Available 24  hours/day Type of Home: House Home Access: Stairs to enter Entrance Stairs-Rails: Can reach both Entrance Stairs-Number of Steps: 4   Home Layout: One level Home Equipment: Conservation officer, nature (2 wheels);BSC/3in1;Tub bench      Prior Function Prior Level of Function : Independent/Modified Independent              Mobility Comments: ind community ambulator using no AD. pt reports 4 history of falls ADLs Comments: ind w/ ADLs     Hand Dominance        Extremity/Trunk Assessment   Upper Extremity Assessment Upper Extremity Assessment: RUE deficits/detail;LUE deficits/detail RUE Deficits / Details: elbow in flexed position from prior history of surgery LUE Deficits / Details: WFL    Lower Extremity Assessment Lower Extremity Assessment: Generalized weakness       Communication   Communication: No difficulties  Cognition Arousal/Alertness: Awake/alert Behavior During Therapy: WFL for tasks assessed/performed Overall Cognitive Status: Within Functional Limits for tasks assessed                                          General Comments      Exercises     Assessment/Plan    PT Assessment Patient needs continued PT services  PT Problem List Decreased strength;Decreased mobility;Decreased activity tolerance;Decreased balance       PT Treatment Interventions DME instruction;Therapeutic exercise;Balance training;Gait training;Stair training;Functional mobility training;Therapeutic activities;Patient/family education    PT Goals (Current goals can be found in the Care Plan section)  Acute Rehab PT Goals Patient Stated Goal: pt would like to regain strength and endurance PT Goal Formulation: With patient Time For Goal Achievement: 02/09/22 Potential to Achieve Goals: Good    Frequency Min 2X/week     Co-evaluation               AM-PAC PT "6 Clicks" Mobility  Outcome Measure Help needed turning from your back to your side while in a flat bed without using bedrails?: A Little Help needed moving from lying on your back to sitting on the side of a flat bed without using bedrails?: A Little Help needed moving to and from a bed to a chair (including a wheelchair)?: A Little Help needed standing up from a chair using your arms (e.g., wheelchair  or bedside chair)?: A Little Help needed to walk in hospital room?: A Little Help needed climbing 3-5 steps with a railing? : A Little 6 Click Score: 18    End of Session Equipment Utilized During Treatment: Gait belt Activity Tolerance: Patient tolerated treatment well Patient left: in chair;with call bell/phone within reach;with chair alarm set;with nursing/sitter in room Nurse Communication: Mobility status PT Visit Diagnosis: Difficulty in walking, not elsewhere classified (R26.2);Muscle weakness (generalized) (M62.81)    Time: 7681-1572 PT Time Calculation (min) (ACUTE ONLY): 25 min   Charges:              Turner Daniels, SPT  01/27/2022, 11:25 AM

## 2022-01-28 ENCOUNTER — Telehealth: Payer: Self-pay

## 2022-01-28 NOTE — Telephone Encounter (Signed)
Transition Care Management Follow-up Telephone Call Date of discharge and from where: TCM DC [facility] 01-27-22 Dx: COVID-febrile neutropenia How have you been since you were released from the hospital? Doing ok  Any questions or concerns? No  Items Reviewed: Did the pt receive and understand the discharge instructions provided? Yes  Medications obtained and verified? Yes  Other? No  Any new allergies since your discharge? No  Dietary orders reviewed? Yes Do you have support at home? Yes   Home Care and Equipment/Supplies: Were home health services ordered? no If so, what is the name of the agency? na  Has the agency set up a time to come to the patient's home? not applicable Were any new equipment or medical supplies ordered?  No What is the name of the medical supply agency? na Were you able to get the supplies/equipment? not applicable Do you have any questions related to the use of the equipment or supplies? No  Functional Questionnaire: (I = Independent and D = Dependent) ADLs: I  Bathing/Dressing- I  Meal Prep- I  Eating- I  Maintaining continence- I  Transferring/Ambulation- I  Managing Meds- I  Follow up appointments reviewed:  PCP Hospital f/u appt confirmed? Yes  Scheduled to see Dr Silvio Pate on 02-03-22 @ [facility] f/u appt confirmed? No  . Are transportation arrangements needed? No  If their condition worsens, is the pt aware to call PCP or go to the Emergency Dept.? Yes Was the patient provided with contact information for the PCP's office or ED? Yes Was to pt encouraged to call back with questions or concerns? Yes

## 2022-02-02 ENCOUNTER — Other Ambulatory Visit: Payer: Self-pay | Admitting: Internal Medicine

## 2022-02-03 ENCOUNTER — Ambulatory Visit (INDEPENDENT_AMBULATORY_CARE_PROVIDER_SITE_OTHER): Payer: Medicare Other | Admitting: Internal Medicine

## 2022-02-03 ENCOUNTER — Encounter: Payer: Self-pay | Admitting: Internal Medicine

## 2022-02-03 VITALS — BP 138/86 | HR 100 | Temp 97.8°F | Ht 65.0 in | Wt 230.0 lb

## 2022-02-03 DIAGNOSIS — I480 Paroxysmal atrial fibrillation: Secondary | ICD-10-CM | POA: Diagnosis not present

## 2022-02-03 DIAGNOSIS — U071 COVID-19: Secondary | ICD-10-CM | POA: Diagnosis not present

## 2022-02-03 DIAGNOSIS — M0600 Rheumatoid arthritis without rheumatoid factor, unspecified site: Secondary | ICD-10-CM

## 2022-02-03 DIAGNOSIS — N1832 Chronic kidney disease, stage 3b: Secondary | ICD-10-CM

## 2022-02-03 DIAGNOSIS — D709 Neutropenia, unspecified: Secondary | ICD-10-CM

## 2022-02-03 LAB — CBC WITH DIFFERENTIAL/PLATELET
Basophils Absolute: 0.2 10*3/uL — ABNORMAL HIGH (ref 0.0–0.1)
Basophils Relative: 1.8 % (ref 0.0–3.0)
Eosinophils Absolute: 0.1 10*3/uL (ref 0.0–0.7)
Eosinophils Relative: 0.7 % (ref 0.0–5.0)
HCT: 35.2 % — ABNORMAL LOW (ref 39.0–52.0)
Hemoglobin: 11.5 g/dL — ABNORMAL LOW (ref 13.0–17.0)
Lymphocytes Relative: 19.9 % (ref 12.0–46.0)
Lymphs Abs: 1.8 10*3/uL (ref 0.7–4.0)
MCHC: 32.7 g/dL (ref 30.0–36.0)
MCV: 95.5 fl (ref 78.0–100.0)
Monocytes Absolute: 1.2 10*3/uL — ABNORMAL HIGH (ref 0.1–1.0)
Monocytes Relative: 13.7 % — ABNORMAL HIGH (ref 3.0–12.0)
Neutro Abs: 5.8 10*3/uL (ref 1.4–7.7)
Neutrophils Relative %: 63.9 % (ref 43.0–77.0)
Platelets: 339 10*3/uL (ref 150.0–400.0)
RBC: 3.69 Mil/uL — ABNORMAL LOW (ref 4.22–5.81)
RDW: 18.4 % — ABNORMAL HIGH (ref 11.5–15.5)
WBC: 9 10*3/uL (ref 4.0–10.5)

## 2022-02-03 LAB — RENAL FUNCTION PANEL
Albumin: 3.8 g/dL (ref 3.5–5.2)
BUN: 16 mg/dL (ref 6–23)
CO2: 30 mEq/L (ref 19–32)
Calcium: 9.3 mg/dL (ref 8.4–10.5)
Chloride: 103 mEq/L (ref 96–112)
Creatinine, Ser: 1.58 mg/dL — ABNORMAL HIGH (ref 0.40–1.50)
GFR: 41.96 mL/min — ABNORMAL LOW (ref >60.00)
Glucose, Bld: 133 mg/dL — ABNORMAL HIGH (ref 70–99)
Phosphorus: 2.8 mg/dL (ref 2.3–4.6)
Potassium: 4.4 mEq/L (ref 3.5–5.1)
Sodium: 138 mEq/L (ref 135–145)

## 2022-02-03 NOTE — Assessment & Plan Note (Signed)
May have been mostly due to severe COVID infection---but is now off methotrexate Will recheck levels today

## 2022-02-03 NOTE — Assessment & Plan Note (Signed)
Better now Discussed slowly restarting exercise---just walking at first

## 2022-02-03 NOTE — Assessment & Plan Note (Signed)
Stable now in low 30's

## 2022-02-03 NOTE — Assessment & Plan Note (Signed)
Persists now Will check echo Cardiology referral Now on eliquis 5 bid--and rate okay on carvedilol 3.125 bid

## 2022-02-03 NOTE — Progress Notes (Signed)
Subjective:    Patient ID: Daniel Kline, male    DOB: [birth year], 76 y.o.   MRN: [id number]  HPI Here for hospital follow up  Admitted to hospital with confusion ("brain fog") Found to be neutropenic and presumed sepsis COVID positive Treated with broad spectrum antibiotics and remdesivir  Found to have prior stroke on CT scan---and in atrial fibrillation Now on eliquis  Feels okay---"not 100%" No SOB with walking in house or personal care Some one shopping for him  No chest pain No palpitations  GFR fairly stable in low 30's  Methotrexate stopped due to neutropenia Was for RA--joints seem okay now  Current Outpatient Medications on File Prior to Visit  Medication Sig Dispense Refill   amLODipine (NORVASC) 5 MG tablet Take 1 tablet (5 mg total) by mouth daily. 90 tablet 3   apixaban (ELIQUIS) 5 MG TABS tablet Take 1 tablet (5 mg total) by mouth 2 (two) times daily. 60 tablet 0   carvedilol (COREG) 3.125 MG tablet Take 1 tablet (3.125 mg total) by mouth 2 (two) times daily with a meal. TAKE ONE (1) TABLET BY MOUTH TWO TIMES PER DAY 60 tablet 0   Cholecalciferol (VITAMIN D-3 PO) Take 10,000 Units by mouth daily.      clobetasol cream (TEMOVATE) 0.86 % Apply 1 application topically 2 (two) times daily as needed (irritation).      clorazepate (TRANXENE) 7.5 MG tablet Take 1 tablet (7.5 mg total) by mouth 2 (two) times daily as needed for anxiety. 60 tablet 0   diclofenac Sodium (VOLTAREN) 1 % GEL APPLY 2 GRAMS TOPICALLY FOUR TIMES DAILY 100 g 11   Elastic Bandages & Supports (MEDICAL COMPRESSION STOCKINGS) MISC 2 Units by Does not apply route daily. 2 each 0   EPINEPHrine 0.3 mg/0.3 mL IJ SOAJ injection Inject 0.3 mLs (0.3 mg total) into the muscle as needed for anaphylaxis. 1 each 5   Ferrous Sulfate (IRON PO) Take 1 tablet by mouth at bedtime.     folic acid (FOLVITE) 1 MG tablet Take 1 mg by mouth daily.     furosemide (LASIX) 40 MG tablet Take 1 tablet (40 mg total) by  mouth 2 (two) times daily. 180 tablet 3   gabapentin (NEURONTIN) 300 MG capsule TAKE 1 CAPSULE BY MOUTH 3  TIMES DAILY 270 capsule 3   glipiZIDE (GLUCOTROL) 5 MG tablet TAKE ONE-HALF TABLET BY  MOUTH TWICE DAILY BEFORE  MEALS 90 tablet 3   HYDROcodone-acetaminophen (NORCO/VICODIN) 5-325 MG tablet TAKE 1 TABLET BY MOUTH EVERY SIX HOURS AS NEEDED FOR PAIN 60 tablet 0   hydrOXYzine (ATARAX) 25 MG tablet TAKE ONE TABLET EVERY EIGHT HOURS AS NEEDED 90 tablet 3   isosorbide mononitrate (IMDUR) 60 MG 24 hr tablet Take 1 tablet (60 mg total) by mouth daily before lunch. 90 tablet 3   ketoconazole (NIZORAL) 2 % cream Apply twice daily to under arm and feet until clear then one additional week 60 g 2   liraglutide (VICTOZA) 18 MG/3ML SOPN Inject 1.2 mg into the skin daily. 1 mL 0   losartan (COZAAR) 100 MG tablet Take 1 tablet (100 mg total) by mouth daily. 90 tablet 3   MAGNESIUM PO Take by mouth.     metFORMIN (GLUCOPHAGE) 500 MG tablet TAKE 1 TABLET BY MOUTH TWICE DAILY 180 tablet 3   Multiple Vitamins-Minerals (MULTIVITAL) tablet Take 1 tablet by mouth daily.     mupirocin ointment (BACTROBAN) 2 % APPLY TO CRUSTS ON THE LEFT LOWER  LEG AND LEFT AXILLA (UNDER ARM) EVERY DAY 22 g 2   ONETOUCH ULTRA test strip CHECK BLOOD SUGAR 3 TIMES DAILY AS DIRECTED 100 each 1   rosuvastatin (CRESTOR) 20 MG tablet TAKE 1 TABLET BY MOUTH  DAILY 90 tablet 3   timolol (TIMOPTIC) 0.5 % ophthalmic solution Place 1 drop into both eyes 2 (two) times daily.     triamcinolone cream (KENALOG) 0.1 % Apply to aa L axilla and L lower leg QD PRN. 80 g 0   No current facility-administered medications on file prior to visit.    Allergies  Allergen Reactions   Bee Venom Anaphylaxis   Oxycodone Other (See Comments)    Delusions Delusions   Delusions   Hydromorphone Other (See Comments)    hallucinating   Hydroxychloroquine     Other reaction(s): Other (See Comments) He broke out really badly.   Zolpidem Other (See  Comments)    Past Medical History:  Diagnosis Date   Anemia    H/O   Anxiety    Arthritis    Atrial fibrillation ([place]) 01/2022   Chronic kidney disease    Complication of anesthesia    CTCL (cutaneous T-cell lymphoma) (HCC)    Diabetes mellitus without complication ([place])    Dysplastic nevus 12/19/2017   Right distal lat. forearm near wrist. Severe atypia, close to peripheral margin.   Dysplastic nevus 06/21/2018   Upper back right paraspinal. Severe atypia, peripheral margin involved. Excised 07/11/2018, margins free.   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 forearm. SCCis, hypertrophic.    Past Surgical History:  Procedure Laterality Date   BACK SURGERY     LUMBAR   CARDIAC ELECTROPHYSIOLOGY STUDY AND ABLATION  2019   COLONOSCOPY WITH PROPOFOL N/A 04/04/2019   Procedure: COLONOSCOPY WITH PROPOFOL;  Surgeon: Toledo, Benay Pike, MD;  Location: [facility]COPY;  Service: Gastroenterology;  Laterality: N/A;   I & D EXTREMITY Right 02/01/2020   Procedure: IRRIGATION AND DEBRIDEMENT EXTREMITY with poly exchange;  Surgeon: Dereck Leep, MD;  Location: [facility] ORS;  Service: Orthopedics;  Laterality: Right;   INCISION AND DRAINAGE     BACK-MRSA INFECTION AFTER BACK SURGERY   KNEE ARTHROPLASTY Right 01/28/2020   Procedure: COMPUTER ASSISTED TOTAL KNEE ARTHROPLASTY;  Surgeon: Dereck Leep, MD;  Location: [facility] ORS;  Service: Orthopedics;  Laterality: Right;   MOUTH  SURGERY     right elbow surgery     right knee surgery     right shoulder surgery     from polio damage   TONSILLECTOMY     TOTAL HIP ARTHROPLASTY Bilateral 04/2016    Family History  Adopted: Yes    Social History   Socioeconomic History   Marital status: Married    Spouse name: Not on file   Number of children: 0   Years of education: Not  on file   Highest education level: Not on file  Occupational History   Occupation: Nature conservation officer    Comment: 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   Occupation: Cytogeneticist    Comment: Retired   Occupation: Warden/ranger    Comment: Retired  Tobacco Use   Smoking status: Former    Types: Cigars    Quit date: 09/23/1990    Years since quitting: 31.3    Passive exposure: Past (as a child)   Smokeless tobacco: Former    Quit date: 02/25/2006  Vaping Use   Vaping Use: Never used  Substance and Sexual Activity   Alcohol use: No    Alcohol/week: 0.0 standard drinks of alcohol   Drug use: No   Sexual activity: Yes    Partners: Female  Other Topics Concern   Not on file  Social History Narrative   Has living will   Wife is health care POA---then brother or sister   Would accept resuscitation attempts but no prolonged ventilation or tube feeds   Social Determinants of Health   Financial Resource Strain: Low Risk  (07/20/2017)   Overall Financial Resource Strain (CARDIA)    Difficulty of Paying Living Expenses: Not hard at all  Food Insecurity: No [place] (07/20/2017)   Hunger Vital Sign    Worried About Running Out of Food in the Last Year: Never true    [place] in the Last Year: Never true  Transportation Needs: No Transportation Needs (07/20/2017)   PRAPARE - Hydrologist (Medical): No    Lack of Transportation (Non-Medical): No  Physical Activity: Not on file  Stress: Not on file  Social Connections: Unknown (07/20/2017)   Social Connection and  Isolation Panel [NHANES]    Frequency of Communication with Friends and Family: Patient refused    Frequency of Social Gatherings with Friends and Family: Patient refused    Attends Religious Services: Patient refused    Active Member of Clubs or Organizations: Patient refused    Attends Archivist Meetings: Patient refused    Marital Status: Patient refused  Intimate Partner Violence: Not At Risk (07/20/2017)   Humiliation, Afraid, Rape, and Kick questionnaire    Fear of Current or Ex-Partner: No    Emotionally Abused: No    Physically Abused: No    Sexually Abused: No   Review of Systems Sleeping okay Appetite is not good now---making himself eat now Weight stable Bowels okay--going daily. No diarrhea Wife still concerned about his hearing     Objective:   Physical Exam Constitutional:      Appearance: Normal appearance.  HENT:     Right Ear: Tympanic membrane and ear canal normal.     Left Ear: Tympanic membrane and ear canal normal.  Cardiovascular:     Rate and Rhythm: Normal rate. Rhythm irregular.     Heart sounds: No murmur heard.    No gallop.  Pulmonary:     Effort: Pulmonary effort is normal.     Breath sounds: Normal breath sounds. No wheezing or rales.  Musculoskeletal:     Cervical back: Neck supple.     Right lower leg: No edema.     Left lower leg: No edema.     Comments: No synovitis in hands  Lymphadenopathy:     Cervical: No cervical adenopathy.  Neurological:     Mental Status: He is alert.            Assessment &  Plan:

## 2022-02-03 NOTE — Patient Instructions (Signed)
Please set up an audiology evaluation (you can go to [place] [facility] for that)

## 2022-02-03 NOTE — Assessment & Plan Note (Signed)
Will need to speak to Dr Posey Pronto about whether to restart MTX or consider other Rx No active synovitis now

## 2022-02-12 NOTE — Progress Notes (Deleted)
Cardiology Office Note    Date:  02/12/2022   ID:  CASHUS HALTERMAN, DOB 1946/05/24, MRN [id number]  PCP:  [name], MD  Cardiologist:  Ida Rogue, MD  Electrophysiologist:  None   Chief Complaint: ***  History of Present Illness:   Daniel Kline is a 76 y.o. male with history of atrial flutter s/p ablation in 04/2017 at outside hospital, CVA, DM, HTN, CKD stage IIIb, RA, lower extremity swelling, chronic pain, and OSA on CPAP who presents for hospital follow up as outlined below.   Echo from [place] in 04/2017 demonstrated an EF of 60-65%, basal LV septal hypertrophy, no RWMA, normal RV systolic function and ventricular cavity size, PASP 41 mmHg, and a mildly dilated left atrium.   He was admitted to [facility] from 01/21/2022 through 01/27/2022 with febrile neutropenia and Covid. Notes indicate a diagnosis of Afib and the patient was started on Eliquis. The initial EKG, with a computer interpretation of Afib, is with significant baseline artifact and wandering, making interpretation difficult. Subsequent EKG tracings, and telemetry strips show sinus rhythm with a possible with grouped atrial beats and underlying artifact. CT head demonstrated evidence of old right occipital infarct in the right PCA territory (he was not aware of a prior stroke). BNP 81 and 64 during the admission. Troponin 18 with a delta troponin of 22.   ***   Labs independently reviewed: 01/2022 - potassium 4.4, BUN 16, serum creatinine 1.58, albumin 3.8, Hgb 11.5, PLT 339, magnesium 2.2, BNP 64, AST/ALT normal 12/2021 - A1c 5.8 03/2020 - TC 100, TG 148, HDL 31, LDL 38  Past Medical History:  Diagnosis Date   Anemia    H/O   Anxiety    Arthritis    Atrial fibrillation ([place]) 01/2022   Chronic kidney disease    Complication of anesthesia    CTCL (cutaneous T-cell lymphoma) (HCC)    Diabetes mellitus without complication ([place])    Dysplastic nevus 12/19/2017   Right distal lat. forearm near wrist.  Severe atypia, close to peripheral margin.   Dysplastic nevus 06/21/2018   Upper back right paraspinal. Severe atypia, peripheral margin involved. Excised 07/11/2018, margins free.   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 forearm. SCCis, hypertrophic.    Past Surgical History:  Procedure Laterality Date   BACK SURGERY     LUMBAR   CARDIAC ELECTROPHYSIOLOGY STUDY AND ABLATION  2019   COLONOSCOPY WITH PROPOFOL N/A 04/04/2019   Procedure: COLONOSCOPY WITH PROPOFOL;  Surgeon: Toledo, Benay Pike, MD;  Location: [facility];  Service: Gastroenterology;  Laterality: N/A;   I & D EXTREMITY Right 02/01/2020   Procedure: IRRIGATION AND DEBRIDEMENT EXTREMITY with poly exchange;  Surgeon: Dereck Leep, MD;  Location: [facility] ORS;  Service: Orthopedics;  Laterality: Right;   INCISION AND DRAINAGE     BACK-MRSA INFECTION  AFTER BACK SURGERY   KNEE ARTHROPLASTY Right 01/28/2020   Procedure: COMPUTER ASSISTED TOTAL KNEE ARTHROPLASTY;  Surgeon: Dereck Leep, MD;  Location: [facility] ORS;  Service: Orthopedics;  Laterality: Right;   MOUTH SURGERY     right elbow surgery     right knee surgery     right shoulder surgery     from polio damage   TONSILLECTOMY     TOTAL HIP ARTHROPLASTY Bilateral 04/2016    Current Medications: No outpatient medications have been marked as taking for the 02/15/22 encounter (Appointment) with Rise Mu, PA-C.    Allergies:   Bee venom, Oxycodone,  Hydromorphone, Hydroxychloroquine, and Zolpidem   Social History   Socioeconomic History   Marital status: Married    Spouse name: Not on file   Number of children: 0   Years of education: Not on file   Highest education level: Not on file  Occupational History   Occupation: Nature conservation officer    Comment: 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   Occupation: Cytogeneticist    Comment: Retired   Occupation: Warden/ranger    Comment: Retired  Tobacco Use   Smoking status: Former    Types: Cigars    Quit date: 09/23/1990    Years since quitting: 31.4    Passive exposure: Past (as a child)   Smokeless tobacco: Former    Quit date: 02/25/2006  Vaping Use   Vaping Use: Never used  Substance and Sexual Activity   Alcohol use: No    Alcohol/week: 0.0 standard drinks of alcohol   Drug use: No   Sexual activity: Yes    Partners: Female  Other Topics Concern   Not on file  Social History Narrative   Has living will   Wife is health care POA---then brother or sister   Would accept resuscitation attempts but no prolonged ventilation or tube feeds   Social Determinants of Health   Financial Resource Strain: Low Risk  (07/20/2017)   Overall Financial Resource Strain (CARDIA)    Difficulty of Paying Living Expenses: Not hard at all  Food Insecurity: No [place] (07/20/2017)   Hunger Vital Sign    Worried About Running Out of Food in the Last Year: Never true    [place] in the Last Year: Never true  Transportation Needs: No Transportation Needs (07/20/2017)   PRAPARE - Hydrologist (Medical): No    Lack of Transportation (Non-Medical): No  Physical Activity: Not on file  Stress: Not on file  Social Connections: Unknown (07/20/2017)   Social Connection and Isolation Panel [NHANES]    Frequency of Communication with Friends and Family: Patient refused    Frequency of Social Gatherings with Friends and Family: Patient  refused    Attends Religious Services: Patient refused    Marine scientist or Organizations: Patient refused    Attends Archivist Meetings: Patient refused    Marital Status: Patient refused     Family History:  The patient's family history is not on file. He was adopted.  ROS:   ROS   EKGs/Labs/Other Studies Reviewed:    Studies reviewed were summarized above. The additional studies were reviewed today:  2D echo: Scheduled for 02/16/2022 __________  EKG:  EKG is ordered today.  The EKG ordered today demonstrates ***  Recent Labs: 01/21/2022: ALT 17 01/24/2022: B Natriuretic Peptide 64.7 01/25/2022: Magnesium 2.2 02/03/2022: BUN 16; Creatinine, Ser 1.58; Hemoglobin 11.5; Platelets 339.0; Potassium 4.4;  Sodium 138  Recent Lipid Panel    Component Value Date/Time   CHOL 100 03/10/2020 1632   TRIG 148.0 03/10/2020 1632   HDL 31.90 (L) 03/10/2020 1632   CHOLHDL 3 03/10/2020 1632   VLDL 29.6 03/10/2020 1632   LDLCALC 38 03/10/2020 1632   LDLDIRECT 94.0 01/27/2017 1037    PHYSICAL EXAM:    VS:  There were no vitals taken for this visit.  BMI: There is no height or weight on file to calculate BMI.  Physical Exam  Wt Readings from Last 3 Encounters:  02/03/22 230 lb (104.3 kg)  01/21/22 231 lb 4.2 oz (104.9 kg)  12/24/21 233 lb (105.7 kg)     ASSESSMENT & PLAN:   ***  History of atrial flutter; Status post ablation in 2018.   Elevated high sensitivity troponin:  HTN: Blood pressure ***  HLD: LDL 38.   {Are you ordering a CV Procedure (e.g. stress test, cath, DCCV, TEE, etc)?   Press F2        :[id number]}     Disposition: F/u with Dr. Rockey Situ or an APP in ***.   Medication Adjustments/Labs and Tests Ordered: Current medicines are reviewed at length with the patient today.  Concerns regarding medicines are outlined above. Medication changes, Labs and Tests ordered today are summarized above and listed in the Patient Instructions accessible in  Encounters.   Signed, Christell Faith, PA-C 02/12/2022 9:45 AM     [place],  [phone]

## 2022-02-15 ENCOUNTER — Ambulatory Visit: Payer: Medicare Other | Admitting: Physician Assistant

## 2022-02-16 ENCOUNTER — Other Ambulatory Visit: Payer: Medicare Other

## 2022-02-18 ENCOUNTER — Telehealth: Payer: Self-pay | Admitting: Internal Medicine

## 2022-02-18 NOTE — Telephone Encounter (Signed)
Nilda Riggs (Spouse) called and is wanting to speak with the nurse about his blood thinners, and if he needs to still be taking those. Please advise and return call when possible, thank you.   Callback #: [phone]

## 2022-02-18 NOTE — Telephone Encounter (Signed)
Spoke to pt's wife. Advised her he is to take the Eliquis every day due to his a.fib

## 2022-02-21 ENCOUNTER — Other Ambulatory Visit: Payer: Self-pay | Admitting: Internal Medicine

## 2022-02-22 ENCOUNTER — Ambulatory Visit: Payer: Medicare Other | Admitting: Physician Assistant

## 2022-02-22 NOTE — Telephone Encounter (Signed)
Last filled 12-24-21 #60 Last OV 02-03-22 Next OV 03-29-22 [facility]

## 2022-02-25 ENCOUNTER — Other Ambulatory Visit: Payer: Self-pay | Admitting: Cardiovascular Disease

## 2022-02-25 DIAGNOSIS — I10 Essential (primary) hypertension: Secondary | ICD-10-CM

## 2022-02-26 ENCOUNTER — Other Ambulatory Visit: Payer: Self-pay | Admitting: Internal Medicine

## 2022-03-02 ENCOUNTER — Other Ambulatory Visit: Payer: Self-pay | Admitting: Internal Medicine

## 2022-03-03 DIAGNOSIS — Z79899 Other long term (current) drug therapy: Secondary | ICD-10-CM | POA: Diagnosis not present

## 2022-03-03 DIAGNOSIS — C8400 Mycosis fungoides, unspecified site: Secondary | ICD-10-CM | POA: Diagnosis not present

## 2022-03-08 DIAGNOSIS — C84A0 Cutaneous T-cell lymphoma, unspecified, unspecified site: Secondary | ICD-10-CM | POA: Diagnosis not present

## 2022-03-09 DIAGNOSIS — D631 Anemia in chronic kidney disease: Secondary | ICD-10-CM | POA: Diagnosis not present

## 2022-03-09 DIAGNOSIS — E1122 Type 2 diabetes mellitus with diabetic chronic kidney disease: Secondary | ICD-10-CM | POA: Diagnosis not present

## 2022-03-09 DIAGNOSIS — I10 Essential (primary) hypertension: Secondary | ICD-10-CM | POA: Diagnosis not present

## 2022-03-09 DIAGNOSIS — N2581 Secondary hyperparathyroidism of renal origin: Secondary | ICD-10-CM | POA: Diagnosis not present

## 2022-03-09 DIAGNOSIS — N1832 Chronic kidney disease, stage 3b: Secondary | ICD-10-CM | POA: Diagnosis not present

## 2022-03-10 ENCOUNTER — Other Ambulatory Visit: Payer: Self-pay | Admitting: Internal Medicine

## 2022-03-10 NOTE — Telephone Encounter (Signed)
Name of Medication: Hydrocodone Name of Pharmacy: [facility] or Written Date and Quantity: 01-21-22 #60 Last Office Visit and Type: 02-03-22 Next Office Visit and Type: 03-29-22 Last Controlled Substance Agreement Date: 06-18-21 Last UDS: 06-18-21

## 2022-03-15 DIAGNOSIS — E119 Type 2 diabetes mellitus without complications: Secondary | ICD-10-CM | POA: Diagnosis not present

## 2022-03-15 DIAGNOSIS — H2513 Age-related nuclear cataract, bilateral: Secondary | ICD-10-CM | POA: Diagnosis not present

## 2022-03-15 DIAGNOSIS — H401111 Primary open-angle glaucoma, right eye, mild stage: Secondary | ICD-10-CM | POA: Diagnosis not present

## 2022-03-15 LAB — HM DIABETES EYE EXAM

## 2022-03-17 ENCOUNTER — Telehealth: Payer: Self-pay | Admitting: Internal Medicine

## 2022-03-17 ENCOUNTER — Ambulatory Visit (INDEPENDENT_AMBULATORY_CARE_PROVIDER_SITE_OTHER): Payer: Medicare Other

## 2022-03-17 DIAGNOSIS — I480 Paroxysmal atrial fibrillation: Secondary | ICD-10-CM | POA: Diagnosis not present

## 2022-03-17 MED ORDER — PERFLUTREN LIPID MICROSPHERE
1.0000 mL | INTRAVENOUS | Status: AC | PRN
  Administered 2022-03-17: 2 mL via INTRAVENOUS

## 2022-03-18 ENCOUNTER — Telehealth: Payer: Self-pay

## 2022-03-18 LAB — ECHOCARDIOGRAM COMPLETE
AR max vel: 4.4 cm2
AV Area VTI: 4.64 cm2
AV Area mean vel: 4.34 cm2
AV Mean grad: 3.5 mmHg
AV Peak grad: 5.3 mmHg
Ao pk vel: 1.15 m/s
Area-P 1/2: 3.65 cm2

## 2022-03-18 NOTE — Telephone Encounter (Signed)
I left a message for the patient to return my call.

## 2022-03-18 NOTE — Telephone Encounter (Signed)
-----   Message from [name], MD sent at 03/18/2022  1:19 PM EDT ----- Please call The echocardiogram was basically normal---good strong heart! We will see what the cardiologist thinks at that appointment Send copy if he wishes

## 2022-03-19 ENCOUNTER — Telehealth: Payer: Self-pay | Admitting: Internal Medicine

## 2022-03-19 NOTE — Telephone Encounter (Signed)
Pt wants to know if he should continue Eliquis. Please to advise.

## 2022-03-19 NOTE — Telephone Encounter (Signed)
Left message on Cell VM per DPR with Dr Alla German note.

## 2022-03-19 NOTE — Telephone Encounter (Signed)
Relayed message to patient below as written  Patient would like a copy sent to address on file  Patient states he may call Dr. Silvio Pate back on Monday

## 2022-03-19 NOTE — Telephone Encounter (Signed)
Report printed and mailed to pt.

## 2022-03-29 ENCOUNTER — Other Ambulatory Visit: Payer: Self-pay | Admitting: Internal Medicine

## 2022-03-29 ENCOUNTER — Ambulatory Visit: Payer: Medicare Other | Admitting: Internal Medicine

## 2022-03-29 ENCOUNTER — Encounter: Payer: Self-pay | Admitting: Internal Medicine

## 2022-03-29 DIAGNOSIS — F1120 Opioid dependence, uncomplicated: Secondary | ICD-10-CM | POA: Diagnosis not present

## 2022-03-29 DIAGNOSIS — G8929 Other chronic pain: Secondary | ICD-10-CM

## 2022-03-29 DIAGNOSIS — I480 Paroxysmal atrial fibrillation: Secondary | ICD-10-CM | POA: Diagnosis not present

## 2022-03-29 DIAGNOSIS — M545 Low back pain, unspecified: Secondary | ICD-10-CM

## 2022-03-29 DIAGNOSIS — Z79899 Other long term (current) drug therapy: Secondary | ICD-10-CM | POA: Diagnosis not present

## 2022-03-29 DIAGNOSIS — C8400 Mycosis fungoides, unspecified site: Secondary | ICD-10-CM | POA: Diagnosis not present

## 2022-03-29 NOTE — Assessment & Plan Note (Signed)
PDMP reviewed No concers

## 2022-03-29 NOTE — Assessment & Plan Note (Signed)
Has been doing okay with exercise regimen and hydrocodone 5/325 once or twice a day

## 2022-03-29 NOTE — Assessment & Plan Note (Signed)
Sounds fairly regular today Echo okay Seeing cardiology soon

## 2022-03-29 NOTE — Progress Notes (Unsigned)
Cardiology Office Note    Date:  03/30/2022   ID:  Dorion, Petillo 1946/03/01, MRN [id number]  PCP:  [name], MD  Cardiologist:  Ida Rogue, MD  Electrophysiologist:  None   Chief Complaint: Follow up  History of Present Illness:   Daniel Kline is a 76 y.o. male with history of atrial flutter status post ablation in 04/2017 at outside hospital, diastolic dysfunction, cutaneous T-cell lymphoma, DM2, CKD stage IIIb, HTN, HLD, chronic low back pain, and OSA on CPAP who presents for follow up of atrial flutter.  He was incidentally found to be in atrial flutter when he presented for prior knee replacement at outside hospital.  Echo at that time demonstrated an EF of 60 to 65% with left ventricular septal hypertrophy, normal wall motion, mildly dilated left atrium, normal RV systolic function and ventricular cavity size, and a PASP of 41 mmHg.  He subsequently underwent successful ablation on 04/28/2017.  He has not been maintained on anticoagulation.  He established care with our office in 01/2019 for preoperative cardiac risk stratification for a right total knee arthroplasty and was without symptoms of angina or decompensation.  He was felt to be acceptable risk for surgery without further testing indicated.  Post operative course was complicated by bleeding requiring arthrotomy, evacuation of hematoma, and I&D.  He was last seen in our office in 05/2021 and was without symptoms of angina or decompensation.  He was admitted to the hospital in 01/2022 with neutropenic sepsis secondary to [place].  During the admission, he was initiated on apixaban for concern of A-fib noted on twelve-lead EKG.  Upon my, and Dr. Donivan Scull, review of these EKGs today, there was no evidence of A-fib with tracings showing baseline wandering along with sinus rhythm with PACs.  He underwent Echo on 03/17/2022 demonstrated an EF of 60 to 65%, no regional wall motion abnormalities, grade 1 diastolic  dysfunction, normal RV systolic function, no significant valvular abnormalities, and normal size/structure aorta.  He comes in today and is doing well from a cardiac perspective, without symptoms of angina or decompensation.  No palpitations, dizziness, presyncope, or syncope.  He did suffer a mechanical fall last evening while taking out his trash can.  He tripped, falling backwards striking the left parietal region of his head with a significant contusion noted.  He did not seek medical care at that time.  His contusion is improving today.  He did not suffer LOC.  He feels like this, and pain involving his left knee from the fall, are contributing to his elevated blood pressure in the office today.  He has remained on apixaban since this was initiated in 01/2022.  No symptoms of hematochezia or melena.  He continues to note some fatigue that dates back to his COVID illness.   Labs independently reviewed: 01/2022 - potassium 4.4, BUN 16, serum creatinine 1.58, albumin 3.8, Hgb 11.5, PLT 339, magnesium 2.2, BNP 64, AST/ALT normal 12/2021 - A1c 5.8 03/2020 - TC 100, TG 148, HDL 31, LDL 38  Past Medical History:  Diagnosis Date   Anemia    H/O   Anxiety    Arthritis    Atrial flutter ([place])    a. s/p post ablation in 04/2017   Chronic kidney disease    Complication of anesthesia    CTCL (cutaneous T-cell lymphoma) (HCC)    Diabetes mellitus without complication ([place])    Dysplastic nevus 12/19/2017   Right distal lat. forearm near wrist. Severe  atypia, close to peripheral margin.   Dysplastic nevus 06/21/2018   Upper back right paraspinal. Severe atypia, peripheral margin involved. Excised 07/11/2018, margins free.   Family history of adverse reaction to anesthesia    PT WAS ADOPTED   HLD (hyperlipidemia)    HTN (hypertension)    Hx of dysplastic nevus 2019   multiple sites   Hx of squamous cell carcinoma 01/18/2018   R mid lateral forearm   MRSA (methicillin resistant Staphylococcus aureus)     after back surgery   OSA (obstructive sleep apnea)    USES BIPAP   Polio    POLIOMYELITIS 01/12/2010   Right arm affected   PONV (postoperative nausea and vomiting)    Squamous cell carcinoma of skin 12/19/2017   Right mid lat. forearm. SCCis, hypertrophic.    Past Surgical History:  Procedure Laterality Date   BACK SURGERY     LUMBAR   CARDIAC ELECTROPHYSIOLOGY STUDY AND ABLATION  2019   COLONOSCOPY WITH PROPOFOL N/A 04/04/2019   Procedure: COLONOSCOPY WITH PROPOFOL;  Surgeon: Toledo, Benay Pike, MD;  Location: [facility] END[facility]COPY;  Service: Gastroenterology;  Laterality: N/A;   I & D EXTREMITY Right 02/01/2020   Procedure: IRRIGATION AND DEBRIDEMENT EXTREMITY with poly exchange;  Surgeon: Dereck Leep, MD;  Location: [facility] ORS;  Service: Orthopedics;  Laterality: Right;   INCISION AND DRAINAGE     BACK-MRSA INFECTION AFTER BACK SURGERY   KNEE ARTHROPLASTY Right 01/28/2020   Procedure: COMPUTER ASSISTED TOTAL KNEE ARTHROPLASTY;  Surgeon: Dereck Leep, MD;  Location: [facility] ORS;  Service: Orthopedics;  Laterality: Right;   MOUTH SURGERY     right elbow surgery     right knee surgery     right shoulder surgery     from polio damage   TONSILLECTOMY     TOTAL HIP ARTHROPLASTY Bilateral 04/2016    Current Medications: Current Meds  Medication Sig   amLODipine (NORVASC) 5 MG tablet Take 1 tablet (5 mg total) by mouth daily.   carvedilol (COREG) 3.125 MG tablet TAKE 1 TABLET BY MOUTH 2 TIMES DAILY WITH A MEAL.   Cholecalciferol (VITAMIN D-3 PO) Take 10,000 Units by mouth daily.    clobetasol cream (TEMOVATE) 5.46 % Apply 1 application topically 2 (two) times daily as needed (irritation).    clorazepate (TRANXENE) 7.5 MG tablet TAKE 1 TABLET BY MOUTH TWICE A DAY AS NEEDED FOR ANXIETY   diclofenac Sodium (VOLTAREN) 1 % GEL APPLY 2 GRAMS TOPICALLY FOUR TIMES DAILY   Elastic Bandages & Supports (MEDICAL COMPRESSION STOCKINGS) MISC 2 Units by Does not apply route daily.   EPINEPHrine 0.3  mg/0.3 mL IJ SOAJ injection Inject 0.3 mLs (0.3 mg total) into the muscle as needed for anaphylaxis.   Ferrous Sulfate (IRON PO) Take 1 tablet by mouth at bedtime.   folic acid (FOLVITE) 1 MG tablet Take 1 mg by mouth daily.   furosemide (LASIX) 40 MG tablet Take 1 tablet (40 mg total) by mouth 2 (two) times daily.   gabapentin (NEURONTIN) 300 MG capsule TAKE 1 CAPSULE BY MOUTH 3  TIMES DAILY   glipiZIDE (GLUCOTROL) 5 MG tablet TAKE ONE-HALF TABLET BY  MOUTH TWICE DAILY BEFORE  MEALS   HYDROcodone-acetaminophen (NORCO/VICODIN) 5-325 MG tablet TAKE 1 TABLET BY MOUTH EVERY SIX HOURS AS NEEDED FOR PAIN   hydrOXYzine (ATARAX) 25 MG tablet TAKE ONE TABLET EVERY EIGHT HOURS AS NEEDED   isosorbide mononitrate (IMDUR) 60 MG 24 hr tablet Take 1 tablet (60 mg total) by mouth daily  before lunch.   ketoconazole (NIZORAL) 2 % cream Apply twice daily to under arm and feet until clear then one additional week   liraglutide (VICTOZA) 18 MG/3ML SOPN Inject 1.2 mg into the skin daily.   losartan (COZAAR) 100 MG tablet TAKE 1 TABLET BY MOUTH DAILY   MAGNESIUM PO Take by mouth.   metFORMIN (GLUCOPHAGE) 500 MG tablet TAKE 1 TABLET BY MOUTH TWICE DAILY   methotrexate (RHEUMATREX) 2.5 MG tablet Take 10 mg by mouth once a week.   Multiple Vitamins-Minerals (MULTIVITAL) tablet Take 1 tablet by mouth daily.   mupirocin ointment (BACTROBAN) 2 % APPLY TO CRUSTS ON THE LEFT LOWER LEG AND LEFT AXILLA (UNDER ARM) EVERY DAY   ONETOUCH ULTRA test strip CHECK BLOOD SUGAR 3 TIMES DAILY AS DIRECTED   rosuvastatin (CRESTOR) 20 MG tablet TAKE 1 TABLET BY MOUTH  DAILY   timolol (TIMOPTIC) 0.5 % ophthalmic solution Place 1 drop into both eyes 2 (two) times daily.   triamcinolone cream (KENALOG) 0.1 % Apply to aa L axilla and L lower leg QD PRN.   [DISCONTINUED] apixaban (ELIQUIS) 5 MG TABS tablet TAKE 1 TABLET BY MOUTH 2 TIMES DAILY.    Allergies:   Bee venom, Oxycodone, Hydromorphone, Hydroxychloroquine, and Zolpidem   Social  History   Socioeconomic History   Marital status: Married    Spouse name: Not on file   Number of children: 0   Years of education: Not on file   Highest education level: Not on file  Occupational History   Occupation: Nature conservation officer    Comment: 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   Occupation: Cytogeneticist    Comment: Retired   Occupation: Warden/ranger    Comment: Retired  Tobacco Use   Smoking status: Former    Types: Cigars    Quit date: 09/23/1990    Years since quitting: 31.5    Passive exposure: Past (as a child)   Smokeless tobacco: Former    Quit date: 02/25/2006  Vaping Use   Vaping Use: Never used  Substance and Sexual Activity   Alcohol use: No    Alcohol/week: 0.0 standard drinks of alcohol   Drug use: No   Sexual activity: Yes    Partners: Female  Other Topics Concern   Not on file  Social History Narrative   Has living will   Wife is health care POA---then brother or sister   Would accept resuscitation attempts but no prolonged ventilation or tube feeds   Social Determinants of Health   Financial Resource Strain: Low Risk  (07/20/2017)   Overall Financial Resource Strain (CARDIA)    Difficulty of Paying Living Expenses: Not hard at all  Food Insecurity: No [place] (07/20/2017)   Hunger Vital Sign    Worried About Running Out of Food in the Last Year: Never true    [place] in the Last Year: Never true  Transportation Needs: No Transportation Needs (07/20/2017)   PRAPARE - Hydrologist (Medical): No    Lack of Transportation (Non-Medical): No  Physical Activity: Not on file  Stress: Not on file  Social Connections: Unknown (07/20/2017)   Social Connection and Isolation Panel [NHANES]    Frequency of Communication with Friends and Family: Patient refused    Frequency of Social Gatherings with Friends and Family: Patient refused    Attends Religious Services: Patient refused     Active Member of Clubs or Organizations: Patient refused    Attends [employer]  or Organization Meetings: Patient refused    Marital Status: Patient refused     Family History:  The patient's family history is not on file. He was adopted.  ROS:   12-point review of systems is negative unless otherwise noted in the HPI.   EKGs/Labs/Other Studies Reviewed:    Studies reviewed were summarized above. The additional studies were reviewed today:  2D echo 03/17/2022: 1. Left ventricular ejection fraction, by estimation, is 60 to 65%. The  left ventricle has normal function. The left ventricle has no regional  wall motion abnormalities. Left ventricular diastolic parameters are  consistent with Grade I diastolic  dysfunction (impaired relaxation).   2. Right ventricular systolic function is normal. The right ventricular  size is not well visualized.   3. The mitral valve is grossly normal. No evidence of mitral valve  regurgitation.   4. The aortic valve was not well visualized. Aortic valve regurgitation  is not visualized. __________  2D echo 04/20/2017: SUMMARY  The left ventricular size is normal.  Basal left ventricular septal hypertrophy  LV ejection fraction = 60-65%.   Left ventricular systolic function is normal.  No segmental wall motion abnormalities seen in the left ventricle  The right ventricle is normal in size and function.  The left atrium is mildly dilated.  There is no significant valvular stenosis or regurgitation  Mild pulmonary hypertension.  Estimated right ventricular systolic pressure is 41 mmHg.  There is no pericardial effusion.  There is no comparison study available.    EKG:  EKG is ordered today.  The EKG ordered today demonstrates NSR, 67 bpm, rare PAC, no acute ST-T changes  Recent Labs: 01/21/2022: ALT 17 01/24/2022: B Natriuretic Peptide 64.7 01/25/2022: Magnesium 2.2 02/03/2022: BUN 16; Creatinine, Ser 1.58; Hemoglobin 11.5; Platelets 339.0; Potassium  4.4; Sodium 138  Recent Lipid Panel    Component Value Date/Time   CHOL 100 03/10/2020 1632   TRIG 148.0 03/10/2020 1632   HDL 31.90 (L) 03/10/2020 1632   CHOLHDL 3 03/10/2020 1632   VLDL 29.6 03/10/2020 1632   LDLCALC 38 03/10/2020 1632   LDLDIRECT 94.0 01/27/2017 1037    PHYSICAL EXAM:    VS:  BP (!) 150/72 (BP Location: Left Arm, Patient Position: Sitting, Cuff Size: Large)   Pulse 67   Ht '5\' 5"'$  (1.651 m)   Wt 238 lb 6.4 oz (108.1 kg)   SpO2 95%   BMI 39.67 kg/m   BMI: Body mass index is 39.67 kg/m.  Physical Exam Vitals reviewed.  Constitutional:      Appearance: He is well-developed.  HENT:     Head: Normocephalic.   Eyes:     General:        Right eye: No discharge.        Left eye: No discharge.  Neck:     Vascular: No JVD.  Cardiovascular:     Rate and Rhythm: Normal rate and regular rhythm.     Heart sounds: Normal heart sounds, S1 normal and S2 normal. Heart sounds not distant. No midsystolic click and no opening snap. No murmur heard.    No friction rub.  Pulmonary:     Effort: Pulmonary effort is normal. No respiratory distress.     Breath sounds: Normal breath sounds. No decreased breath sounds, wheezing or rales.  Chest:     Chest wall: No tenderness.  Abdominal:     General: There is no distension.  Musculoskeletal:     Cervical back: Normal range of motion.  Comments: Trivial bilateral pretibial edema.  Skin:    General: Skin is warm and dry.     Nails: There is no clubbing.  Neurological:     Mental Status: He is alert and oriented to person, place, and time.  Psychiatric:        Speech: Speech normal.        Behavior: Behavior normal.        Thought Content: Thought content normal.        Judgment: Judgment normal.     Wt Readings from Last 3 Encounters:  03/30/22 238 lb 6.4 oz (108.1 kg)  03/29/22 240 lb (108.9 kg)  02/03/22 230 lb (104.3 kg)     ASSESSMENT & PLAN:   Atrial flutter: Status post ablation in 04/2017.   Maintaining sinus rhythm.  He was started on apixaban in 01/2022 for concern of A-fib noted on twelve-lead EKG.  However, upon Dr. Donivan Scull and my review in the office today, these tracings demonstrate sinus rhythm with PACs with baseline artifact.  Given the lack of objective evidence of A-fib, we will discontinue apixaban.  Place Zio patch.  Dr. Rockey Situ indicates he will addend the finalized EKG interpretation.  In the context of his mechanical fall striking his head last evening, while on apixaban, we obtained a stat head CT this afternoon which showed no evidence of acute intracranial abnormality with a left parietal hematoma, and mild chronic disease.  HTN: Blood pressure is elevated at 150/72 at triage in the setting of the above.  Given this, we will continue to monitor.  For now, continue current medical therapy.  HLD: LDL 38.  He remains on simvastatin 20 mg.  OSA: Continue CPAP.  CKD stage IIIb: Followed by nephrology.  Mechanical fall with head trauma: Stat head CT without acute intracranial abnormality with left parietal hematoma noted as outlined above.   Disposition: F/u with Dr. Rockey Situ or an APP in 2 months.   Medication Adjustments/Labs and Tests Ordered: Current medicines are reviewed at length with the patient today.  Concerns regarding medicines are outlined above. Medication changes, Labs and Tests ordered today are summarized above and listed in the Patient Instructions accessible in Encounters.   Signed, Christell Faith, PA-C 03/30/2022 3:50 PM     [place] [address] [place], [place] [address] [phone]

## 2022-03-29 NOTE — Progress Notes (Signed)
Subjective:    Patient ID: Daniel Kline, male    DOB: Apr 10, 1946, 76 y.o.   MRN: [id number]  HPI Here for follow up of chronic pain and narcotic dependence  Feels he is mostly over the [place] Has occasional "brain fog"--he thinks this is lingering Mild only---not affecting his ADLs, etc  Weight is up some  Still on the furosemide 40 bid Hasn't been monitoring weight at home No SOB  Ongoing pain Does okay with exercise and hydrocodone twice a day (occ only 1 per day)  Current Outpatient Medications on File Prior to Visit  Medication Sig Dispense Refill   amLODipine (NORVASC) 5 MG tablet Take 1 tablet (5 mg total) by mouth daily. 90 tablet 3   apixaban (ELIQUIS) 5 MG TABS tablet TAKE 1 TABLET BY MOUTH 2 TIMES DAILY. 60 tablet 11   carvedilol (COREG) 3.125 MG tablet TAKE 1 TABLET BY MOUTH 2 TIMES DAILY WITH A MEAL. 180 tablet 3   Cholecalciferol (VITAMIN D-3 PO) Take 10,000 Units by mouth daily.      clobetasol cream (TEMOVATE) 9.47 % Apply 1 application topically 2 (two) times daily as needed (irritation).      clorazepate (TRANXENE) 7.5 MG tablet TAKE 1 TABLET BY MOUTH 2 TIMES DAILY AS NEEDED FOR ANXIETY. 60 tablet 0   diclofenac Sodium (VOLTAREN) 1 % GEL APPLY 2 GRAMS TOPICALLY FOUR TIMES DAILY 100 g 11   Elastic Bandages & Supports (MEDICAL COMPRESSION STOCKINGS) MISC 2 Units by Does not apply route daily. 2 each 0   EPINEPHrine 0.3 mg/0.3 mL IJ SOAJ injection Inject 0.3 mLs (0.3 mg total) into the muscle as needed for anaphylaxis. 1 each 5   Ferrous Sulfate (IRON PO) Take 1 tablet by mouth at bedtime.     folic acid (FOLVITE) 1 MG tablet Take 1 mg by mouth daily.     furosemide (LASIX) 40 MG tablet Take 1 tablet (40 mg total) by mouth 2 (two) times daily. 180 tablet 3   gabapentin (NEURONTIN) 300 MG capsule TAKE 1 CAPSULE BY MOUTH 3  TIMES DAILY 270 capsule 3   glipiZIDE (GLUCOTROL) 5 MG tablet TAKE ONE-HALF TABLET BY  MOUTH TWICE DAILY BEFORE  MEALS 90 tablet 3    HYDROcodone-acetaminophen (NORCO/VICODIN) 5-325 MG tablet TAKE 1 TABLET BY MOUTH EVERY SIX HOURS AS NEEDED FOR PAIN 60 tablet 0   hydrOXYzine (ATARAX) 25 MG tablet TAKE ONE TABLET EVERY EIGHT HOURS AS NEEDED 90 tablet 3   isosorbide mononitrate (IMDUR) 60 MG 24 hr tablet Take 1 tablet (60 mg total) by mouth daily before lunch. 90 tablet 3   ketoconazole (NIZORAL) 2 % cream Apply twice daily to under arm and feet until clear then one additional week 60 g 2   liraglutide (VICTOZA) 18 MG/3ML SOPN Inject 1.2 mg into the skin daily. 1 mL 0   losartan (COZAAR) 100 MG tablet TAKE 1 TABLET BY MOUTH DAILY 90 tablet 0   MAGNESIUM PO Take by mouth.     metFORMIN (GLUCOPHAGE) 500 MG tablet TAKE 1 TABLET BY MOUTH TWICE DAILY 180 tablet 3   Multiple Vitamins-Minerals (MULTIVITAL) tablet Take 1 tablet by mouth daily.     mupirocin ointment (BACTROBAN) 2 % APPLY TO CRUSTS ON THE LEFT LOWER LEG AND LEFT AXILLA (UNDER ARM) EVERY DAY 22 g 2   ONETOUCH ULTRA test strip CHECK BLOOD SUGAR 3 TIMES DAILY AS DIRECTED 100 each 1   rosuvastatin (CRESTOR) 20 MG tablet TAKE 1 TABLET BY MOUTH  DAILY 90  tablet 3   timolol (TIMOPTIC) 0.5 % ophthalmic solution Place 1 drop into both eyes 2 (two) times daily.     triamcinolone cream (KENALOG) 0.1 % Apply to aa L axilla and L lower leg QD PRN. 80 g 0   No current facility-administered medications on file prior to visit.    Allergies  Allergen Reactions   Bee Venom Anaphylaxis   Oxycodone Other (See Comments)    Delusions Delusions   Delusions   Hydromorphone Other (See Comments)    hallucinating   Hydroxychloroquine     Other reaction(s): Other (See Comments) He broke out really badly.   Zolpidem Other (See Comments)    Past Medical History:  Diagnosis Date   Anemia    H/O   Anxiety    Arthritis    Atrial fibrillation ([place]) 01/2022   Chronic kidney disease    Complication of anesthesia    CTCL (cutaneous T-cell lymphoma) (HCC)    Diabetes mellitus without  complication ([place])    Dysplastic nevus 12/19/2017   Right distal lat. forearm near wrist. Severe atypia, close to peripheral margin.   Dysplastic nevus 06/21/2018   Upper back right paraspinal. Severe atypia, peripheral margin involved. Excised 07/11/2018, margins free.   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 forearm. SCCis, hypertrophic.    Past Surgical History:  Procedure Laterality Date   BACK SURGERY     LUMBAR   CARDIAC ELECTROPHYSIOLOGY STUDY AND ABLATION  2019   COLONOSCOPY WITH PROPOFOL N/A 04/04/2019   Procedure: COLONOSCOPY WITH PROPOFOL;  Surgeon: Toledo, Benay Pike, MD;  Location: [facility] END[facility];  Service: Gastroenterology;  Laterality: N/A;   I & D EXTREMITY Right 02/01/2020   Procedure: IRRIGATION AND DEBRIDEMENT EXTREMITY with poly exchange;  Surgeon: Dereck Leep, MD;  Location: [facility] ORS;  Service: Orthopedics;  Laterality: Right;   INCISION AND DRAINAGE     BACK-MRSA INFECTION AFTER BACK SURGERY   KNEE ARTHROPLASTY Right 01/28/2020   Procedure: COMPUTER ASSISTED TOTAL KNEE ARTHROPLASTY;  Surgeon: Dereck Leep, MD;  Location: [facility] ORS;  Service: Orthopedics;  Laterality: Right;   MOUTH SURGERY     right elbow surgery     right knee surgery     right shoulder surgery     from polio damage   TONSILLECTOMY     TOTAL HIP ARTHROPLASTY Bilateral 04/2016    Family History  Adopted: Yes    Social History   Socioeconomic History   Marital  status: Married    Spouse name: Not on file   Number of children: 0   Years of education: Not on file   Highest education level: Not on file  Occupational History   Occupation: Nature conservation officer    Comment: 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   Occupation: Cytogeneticist    Comment: Retired   Occupation: Warden/ranger  Comment: Retired  Tobacco Use   Smoking status: Former    Types: Cigars    Quit date: 09/23/1990    Years since quitting: 31.5    Passive exposure: Past (as a child)   Smokeless tobacco: Former    Quit date: 02/25/2006  Vaping Use   Vaping Use: Never used  Substance and Sexual Activity   Alcohol use: No    Alcohol/week: 0.0 standard drinks of alcohol   Drug use: No   Sexual activity: Yes    Partners: Female  Other Topics Concern   Not on file  Social History Narrative   Has living will   Wife is health care POA---then brother or sister   Would accept resuscitation attempts but no prolonged ventilation or tube feeds   Social Determinants of Health   Financial Resource Strain: Low Risk  (07/20/2017)   Overall Financial Resource Strain (CARDIA)    Difficulty of Paying Living Expenses: Not hard at all  Food Insecurity: No Food Insecurity (07/20/2017)   Hunger Vital Sign    Worried About Running Out of Food in the Last Year: Never true    Ran Out of Food in the Last Year: Never true  Transportation Needs: No Transportation Needs (07/20/2017)   PRAPARE - Hydrologist (Medical): No    Lack of Transportation (Non-Medical): No  Physical Activity: Not on file  Stress: Not on file  Social Connections: Unknown (07/20/2017)   Social Connection and Isolation Panel [NHANES]    Frequency of Communication with Friends and Family: Patient refused    Frequency of Social Gatherings with Friends and Family: Patient refused    Attends Religious Services: Patient refused    Active Member of Clubs or Organizations: Patient  refused    Attends [employer] Meetings: Patient refused    Marital Status: Patient refused  Intimate Partner Violence: Not At Risk (07/20/2017)   Humiliation, Afraid, Rape, and Kick questionnaire    Fear of Current or Ex-Partner: No    Emotionally Abused: No    Physically Abused: No    Sexually Abused: No   Review of Systems Just feels tired at times Sleeping okay---actual fell asleep in chair and slept the whole night there Usually is in recliner Appetite is fine     Objective:   Physical Exam Constitutional:      Appearance: Normal appearance.  Cardiovascular:     Rate and Rhythm: Normal rate.     Comments: Regular with skips? Pulmonary:     Effort: Pulmonary effort is normal.     Breath sounds: Normal breath sounds. No wheezing or rales.  Musculoskeletal:     Cervical back: Neck supple.  Lymphadenopathy:     Cervical: No cervical adenopathy.  Neurological:     Mental Status: He is alert.            Assessment & Plan:

## 2022-03-30 ENCOUNTER — Ambulatory Visit
Admission: RE | Admit: 2022-03-30 | Discharge: 2022-03-30 | Disposition: A | Discharge status: Home / Self Care | Payer: Medicare Other | Source: Ambulatory Visit | Attending: Physician Assistant | Admitting: Physician Assistant

## 2022-03-30 ENCOUNTER — Ambulatory Visit: Payer: Medicare Other | Admitting: Physician Assistant

## 2022-03-30 ENCOUNTER — Encounter: Payer: Self-pay | Admitting: Physician Assistant

## 2022-03-30 ENCOUNTER — Ambulatory Visit: Payer: Self-pay

## 2022-03-30 VITALS — BP 150/72 | HR 67 | Ht 65.0 in | Wt 238.4 lb

## 2022-03-30 DIAGNOSIS — I480 Paroxysmal atrial fibrillation: Secondary | ICD-10-CM

## 2022-03-30 DIAGNOSIS — S0990XA Unspecified injury of head, initial encounter: Secondary | ICD-10-CM | POA: Diagnosis not present

## 2022-03-30 DIAGNOSIS — I6782 Cerebral ischemia: Secondary | ICD-10-CM | POA: Insufficient documentation

## 2022-03-30 DIAGNOSIS — I483 Typical atrial flutter: Secondary | ICD-10-CM

## 2022-03-30 DIAGNOSIS — S0003XA Contusion of scalp, initial encounter: Secondary | ICD-10-CM | POA: Diagnosis not present

## 2022-03-30 DIAGNOSIS — N1832 Chronic kidney disease, stage 3b: Secondary | ICD-10-CM

## 2022-03-30 DIAGNOSIS — W19XXXA Unspecified fall, initial encounter: Secondary | ICD-10-CM | POA: Diagnosis not present

## 2022-03-30 DIAGNOSIS — E782 Mixed hyperlipidemia: Secondary | ICD-10-CM

## 2022-03-30 DIAGNOSIS — G4733 Obstructive sleep apnea (adult) (pediatric): Secondary | ICD-10-CM | POA: Diagnosis not present

## 2022-03-30 DIAGNOSIS — X58XXXA Exposure to other specified factors, initial encounter: Secondary | ICD-10-CM | POA: Insufficient documentation

## 2022-03-30 DIAGNOSIS — I10 Essential (primary) hypertension: Secondary | ICD-10-CM | POA: Diagnosis not present

## 2022-03-30 NOTE — Patient Instructions (Signed)
Medication Instructions:  Your physician has recommended you make the following change in your medication:   STOP Eliquis  *If you need a refill on your cardiac medications before your next appointment, please call your pharmacy*   Lab Work: None  If you have labs (blood work) drawn today and your tests are completely normal, you will receive your results only by: [place] (if you have MyChart) OR A paper copy in the mail If you have any lab test that is abnormal or we need to change your treatment, we will call you to review the results.   Testing/Procedures: Non-Cardiac CT scanning, (CAT scanning), is a noninvasive, special x-ray that produces cross-sectional images of the body using x-rays and a computer. CT scans help physicians diagnose and treat medical conditions. For some CT exams, a contrast material is used to enhance visibility in the area of the body being studied. CT scans provide greater clarity and reveal more details than regular x-ray exams.  Your physician has recommended that you wear a Zio monitor.   This monitor is a medical device that records the heart's electrical activity. Doctors most often use these monitors to diagnose arrhythmias. Arrhythmias are problems with the speed or rhythm of the heartbeat. The monitor is a small device applied to your chest. You can wear one while you do your normal daily activities. While wearing this monitor if you have any symptoms to push the button and record what you felt. Once you have worn this monitor for the period of time provider prescribed (Usually 14 days), you will return the monitor device in the postage paid box. Once it is returned they will download the data collected and provide [place] with a report which the provider will then review and we will call you with those results. Important tips:  Avoid showering during the first 24 hours of wearing the monitor. Avoid excessive sweating to help maximize wear time. Do not  submerge the device, no hot tubs, and no swimming pools. Keep any lotions or oils away from the patch. After 24 hours you may shower with the patch on. Take brief showers with your back facing the shower head.  Do not remove patch once it has been placed because that will interrupt data and decrease adhesive wear time. Push the button when you have any symptoms and write down what you were feeling. Once you have completed wearing your monitor, remove and place into box which has postage paid and place in your outgoing mailbox.  If for some reason you have misplaced your box then call our office and we can provide another box and/or mail it off for you.     Follow-Up: At [facility], you and your health needs are our priority.  As part of our continuing mission to provide you with exceptional heart care, we have created designated Provider Care Teams.  These Care Teams include your primary Cardiologist (physician) and Advanced Practice Providers (APPs -  Physician Assistants and Nurse Practitioners) who all work together to provide you with the care you need, when you need it.  We recommend signing up for the patient portal called "MyChart".  Sign up information is provided on this After Visit Summary.  MyChart is used to connect with patients for Virtual Visits (Telemedicine).  Patients are able to view lab/test results, encounter notes, upcoming appointments, etc.  Non-urgent messages can be sent to your provider as well.   To learn more about what you can do with MyChart,  go to [email or web address].    Your next appointment:   2 month(s)  The format for your next appointment:   In Person  Provider:   Ida Rogue, MD or Christell Faith, PA-C      Important Information About Sugar

## 2022-03-31 ENCOUNTER — Telehealth: Payer: Self-pay

## 2022-03-31 NOTE — Telephone Encounter (Signed)
Spoke to pt's wife per DPR. Rx up front in yellow folders in a whit envelope

## 2022-03-31 NOTE — Telephone Encounter (Signed)
Spoke to  pt's wife .

## 2022-03-31 NOTE — Telephone Encounter (Signed)
Daniel Kline (DPR signed) is requesting a written order for TED hose; pt has been wearing for a while and needs new pair. Daniel Kline request cb when written order is ready for pick up.

## 2022-03-31 NOTE — Telephone Encounter (Signed)
  [name], MD to Me  [name], [facility]      03/31/22 10:24 AM Let him know that is great news and it is good he doesn't need the eliquis anymore    Daniel Kline (DPR signed) notified as instructed by Dr Silvio Pate and voiced understanding. Nothing further needed at this time.

## 2022-03-31 NOTE — Telephone Encounter (Signed)
Sending note to Dr Silvio Pate.   [place] Night - Client TELEPHONE ADVICE RECORD [facility] Patient Name: Daniel Kline Gender: Male DOB: 09-29-1945 Age: 76 Y 64 M 22 D Return Phone Number: [phone] (Primary) Address: City/ State/ Zip: [place]  [address] Client [facility] Night - Client Client Site [place] Provider Viviana Simpler- MD Contact Type Call Who Is Calling Patient / Member / Family / Caregiver Call Type Triage / Clinical Caller Name Traeton Bordas Relationship To Patient Spouse Return Phone Number [phone] (Primary) Chief Complaint Medication Question (non symptomatic) Reason for Call Medication Question / Request Initial Comment Caller states they want to speak with a nurse. Her husband is on heart medication and he went to the cardiologist today and they told him to get off of it. Additional Comment They told him to get off the medication now and not to take anymore of it that he doesn't need it after reviewing his EKG and saying it was read wrong. Translation No Nurse Assessment Nurse: Velta Addison, RN, Helene Kelp Date/Time Eilene Ghazi Time): 03/30/2022 5:54:34 PM Confirm and document reason for call. If symptomatic, describe symptoms. ---Caller states that he was seen by cardiology today and instructed to stop eliquis. Reports that she just wanted PCP to be aware. Reports that he was seen by PA and told that he had a-fib. Reports that the cardiologist told him today that he does not have a-fib. Does the patient have any new or worsening symptoms? ---Yes Will a triage be completed? ---Yes Related visit to physician within the last 2 weeks? ---Yes Does the PT have any chronic conditions? (i.e. diabetes, asthma, this includes High risk factors for pregnancy, etc.) ---Yes List chronic conditions. ---DM, HTN, cardiac, kidney disease. Is this a behavioral health or  substance abuse call? ---No Guidelines Guideline Title Affirmed Question Affirmed Notes Nurse Date/Time (Eastern Time) Recent Medical Visit for Injury Follow-up Call Taking Coumadin (warfarin) or other strong blood thinner, Velta Addison, RN, Helene Kelp 03/30/2022 5:57:04 PM PLEASE NOTE: All timestamps contained within this report are represented as [place] Standard Time. CONFIDENTIALTY NOTICE: This fax transmission is intended only for the addressee. It contains information that is legally privileged, confidential or otherwise protected from use or disclosure. If you are not the intended recipient, you are strictly prohibited from reviewing, disclosing, copying using or disseminating any of this information or taking any action in reliance on or regarding this information. If you have received this fax in error, please notify us immediately by telephone so that we can arrange for its return to [place]. Phone: [phone], Toll-Free: [phone], Fax: [phone] Page: 2 of 2 Call Id: [id number] Guidelines Guideline Title Affirmed Question Affirmed Notes Nurse Date/Time Eilene Ghazi Time) or known bleeding disorder (e.g., thrombocytopenia) Disp. Time Eilene Ghazi Time) Disposition Final User 03/30/2022 6:01:21 PM Call PCP Now Yes Velta Addison, RN, Helene Kelp 03/30/2022 6:04:11 PM Called On-Call Provider Velta Addison, RN, Helene Kelp Final Disposition 03/30/2022 6:01:21 PM Call PCP Now Yes Velta Addison, RN, [place] Artist Disagree/Comply Comply Caller Understands Yes PreDisposition Home Care Care Advice Given Per Guideline CALL PCP NOW: * I'll page the on-call provider now. If you haven't heard from the provider (or me) within 30 minutes, call again. CARE ADVICE given per Recent Medical Visit for Injury: Follow-Up Call (Adult) guideline. Comments User: Carles Collet, RN Date/Time Eilene Ghazi Time): 03/30/2022 5:58:16 PM Also had a CT of head today for fall with head injury yesterday. User: Carles Collet,  RN Date/Time  Eilene Ghazi Time): 03/30/2022 6:00:38 PM Had his morning dose of eliquis. Rates headache 4/10. Paging DoctorName Phone DateTime Result/ Outcome Message Type Notes Billey Gosling - MD [phone] 03/30/2022 6:04:11 PM Called On Call Provider - Reached Doctor Paged Billey Gosling - MD 03/30/2022 6:04:43 PM Spoke with On Call - General Message Result Provider will leave a note tha

## 2022-04-01 DIAGNOSIS — Z7960 Long term (current) use of unspecified immunomodulators and immunosuppressants: Secondary | ICD-10-CM | POA: Diagnosis not present

## 2022-04-01 DIAGNOSIS — C84A0 Cutaneous T-cell lymphoma, unspecified, unspecified site: Secondary | ICD-10-CM | POA: Diagnosis not present

## 2022-04-01 DIAGNOSIS — M0609 Rheumatoid arthritis without rheumatoid factor, multiple sites: Secondary | ICD-10-CM | POA: Diagnosis not present

## 2022-04-02 DIAGNOSIS — M79675 Pain in left toe(s): Secondary | ICD-10-CM | POA: Diagnosis not present

## 2022-04-02 DIAGNOSIS — B351 Tinea unguium: Secondary | ICD-10-CM | POA: Diagnosis not present

## 2022-04-02 DIAGNOSIS — M79674 Pain in right toe(s): Secondary | ICD-10-CM | POA: Diagnosis not present

## 2022-04-05 DIAGNOSIS — H2511 Age-related nuclear cataract, right eye: Secondary | ICD-10-CM | POA: Diagnosis not present

## 2022-04-08 ENCOUNTER — Other Ambulatory Visit: Payer: Self-pay | Admitting: Internal Medicine

## 2022-04-08 NOTE — Telephone Encounter (Signed)
Name of Medication: Hydrocodone Name of Pharmacy: [facility] or Written Date and Quantity: 03-11-22 #60 Last Office Visit and Type: 03-29-22 Next Office Visit and Type: 07-15-22 Last Controlled Substance Agreement Date: 06-18-21 Last UDS: 06-18-21

## 2022-04-26 NOTE — Progress Notes (Signed)
[facility] Quality Team Note  Name: Daniel Kline Date of Birth: 1945/11/11 MRN: [id number] Date: 04/26/2022  [facility] Quality Team has reviewed this patient's chart, please see recommendations below:  [facility] [place] Quality Other; Pt has open quality gap for KED, needs Micro/Creat urine test to close this. Please address at next visit.

## 2022-04-29 ENCOUNTER — Encounter: Payer: Self-pay | Admitting: Ophthalmology

## 2022-04-29 DIAGNOSIS — Z79899 Other long term (current) drug therapy: Secondary | ICD-10-CM | POA: Diagnosis not present

## 2022-04-29 DIAGNOSIS — C8400 Mycosis fungoides, unspecified site: Secondary | ICD-10-CM | POA: Diagnosis not present

## 2022-05-03 DIAGNOSIS — N1832 Chronic kidney disease, stage 3b: Secondary | ICD-10-CM | POA: Diagnosis not present

## 2022-05-03 DIAGNOSIS — I10 Essential (primary) hypertension: Secondary | ICD-10-CM | POA: Diagnosis not present

## 2022-05-03 DIAGNOSIS — E1122 Type 2 diabetes mellitus with diabetic chronic kidney disease: Secondary | ICD-10-CM | POA: Diagnosis not present

## 2022-05-03 DIAGNOSIS — N2581 Secondary hyperparathyroidism of renal origin: Secondary | ICD-10-CM | POA: Diagnosis not present

## 2022-05-05 ENCOUNTER — Ambulatory Visit: Payer: Medicare Other | Admitting: Anesthesiology

## 2022-05-05 ENCOUNTER — Ambulatory Visit (AMBULATORY_SURGERY_CENTER): Payer: Medicare Other | Admitting: Anesthesiology

## 2022-05-05 ENCOUNTER — Encounter
Admission: RE | Disposition: A | Discharge status: Home / Self Care | Payer: Self-pay | Source: Home / Self Care | Attending: Ophthalmology

## 2022-05-05 ENCOUNTER — Other Ambulatory Visit: Payer: Self-pay

## 2022-05-05 ENCOUNTER — Ambulatory Visit
Admission: RE | Admit: 2022-05-05 | Discharge: 2022-05-05 | Disposition: A | Discharge status: Home / Self Care | Payer: Medicare Other | Attending: Ophthalmology | Admitting: Ophthalmology

## 2022-05-05 DIAGNOSIS — E785 Hyperlipidemia, unspecified: Secondary | ICD-10-CM | POA: Diagnosis not present

## 2022-05-05 DIAGNOSIS — Z7984 Long term (current) use of oral hypoglycemic drugs: Secondary | ICD-10-CM | POA: Diagnosis not present

## 2022-05-05 DIAGNOSIS — H2511 Age-related nuclear cataract, right eye: Secondary | ICD-10-CM

## 2022-05-05 DIAGNOSIS — I10 Essential (primary) hypertension: Secondary | ICD-10-CM

## 2022-05-05 DIAGNOSIS — Z87891 Personal history of nicotine dependence: Secondary | ICD-10-CM | POA: Diagnosis not present

## 2022-05-05 DIAGNOSIS — Z8614 Personal history of Methicillin resistant Staphylococcus aureus infection: Secondary | ICD-10-CM | POA: Insufficient documentation

## 2022-05-05 DIAGNOSIS — E1136 Type 2 diabetes mellitus with diabetic cataract: Secondary | ICD-10-CM | POA: Insufficient documentation

## 2022-05-05 DIAGNOSIS — E119 Type 2 diabetes mellitus without complications: Secondary | ICD-10-CM

## 2022-05-05 DIAGNOSIS — Z8612 Personal history of poliomyelitis: Secondary | ICD-10-CM | POA: Diagnosis not present

## 2022-05-05 DIAGNOSIS — Z85828 Personal history of other malignant neoplasm of skin: Secondary | ICD-10-CM | POA: Diagnosis not present

## 2022-05-05 DIAGNOSIS — I129 Hypertensive chronic kidney disease with stage 1 through stage 4 chronic kidney disease, or unspecified chronic kidney disease: Secondary | ICD-10-CM | POA: Insufficient documentation

## 2022-05-05 DIAGNOSIS — N189 Chronic kidney disease, unspecified: Secondary | ICD-10-CM | POA: Diagnosis not present

## 2022-05-05 DIAGNOSIS — E1122 Type 2 diabetes mellitus with diabetic chronic kidney disease: Secondary | ICD-10-CM | POA: Insufficient documentation

## 2022-05-05 DIAGNOSIS — G4733 Obstructive sleep apnea (adult) (pediatric): Secondary | ICD-10-CM | POA: Insufficient documentation

## 2022-05-05 HISTORY — PX: CATARACT EXTRACTION W/PHACO: SHX586

## 2022-05-05 LAB — GLUCOSE, CAPILLARY
Glucose-Capillary: 112 mg/dL — ABNORMAL HIGH (ref 70–99)
Glucose-Capillary: 88 mg/dL (ref 70–99)

## 2022-05-05 SURGERY — PHACOEMULSIFICATION, CATARACT, WITH IOL INSERTION
Anesthesia: Monitor Anesthesia Care | Site: Eye | Laterality: Right

## 2022-05-05 MED ORDER — FENTANYL CITRATE (PF) 100 MCG/2ML IJ SOLN
INTRAMUSCULAR | Status: DC | PRN
  Administered 2022-05-05: 100 ug via INTRAVENOUS

## 2022-05-05 MED ORDER — LACTATED RINGERS IV SOLN: INTRAVENOUS | Status: DC

## 2022-05-05 MED ORDER — SIGHTPATH DOSE#1 BSS IO SOLN
INTRAOCULAR | Status: DC | PRN
  Administered 2022-05-05: 15 mL

## 2022-05-05 MED ORDER — SIGHTPATH DOSE#1 NA HYALUR & NA CHOND-NA HYALUR IO KIT
PACK | INTRAOCULAR | Status: DC | PRN
  Administered 2022-05-05: 1 via OPHTHALMIC

## 2022-05-05 MED ORDER — SIGHTPATH DOSE#1 BSS IO SOLN
INTRAOCULAR | Status: DC | PRN
  Administered 2022-05-05: 1 mL via INTRAMUSCULAR

## 2022-05-05 MED ORDER — CEFUROXIME OPHTHALMIC INJECTION 1 MG/0.1 ML
INJECTION | OPHTHALMIC | Status: DC | PRN
  Administered 2022-05-05: 0.1 mL via INTRACAMERAL

## 2022-05-05 MED ORDER — ARMC OPHTHALMIC DILATING DROPS
1.0000 | OPHTHALMIC | Status: AC | PRN
  Administered 2022-05-05 (×3): 1 via OPHTHALMIC

## 2022-05-05 MED ORDER — TETRACAINE HCL 0.5 % OP SOLN
1.0000 [drp] | OPHTHALMIC | Status: AC | PRN
  Administered 2022-05-05 (×3): 1 [drp] via OPHTHALMIC

## 2022-05-05 MED ORDER — MIDAZOLAM HCL 2 MG/2ML IJ SOLN
INTRAMUSCULAR | Status: DC | PRN
  Administered 2022-05-05: 1 mg via INTRAVENOUS

## 2022-05-05 MED ORDER — SIGHTPATH DOSE#1 BSS IO SOLN
INTRAOCULAR | Status: DC | PRN
  Administered 2022-05-05: 62 mL via OPHTHALMIC

## 2022-05-05 MED ORDER — BRIMONIDINE TARTRATE-TIMOLOL 0.2-0.5 % OP SOLN
OPHTHALMIC | Status: DC | PRN
  Administered 2022-05-05: 1 [drp] via OPHTHALMIC

## 2022-05-05 SURGICAL SUPPLY — 10 items
CATARACT SUITE SIGHTPATH (MISCELLANEOUS) ×1 IMPLANT
FEE CATARACT SUITE SIGHTPATH (MISCELLANEOUS) ×1 IMPLANT
GLOVE SRG 8 PF TXTR STRL LF DI (GLOVE) ×1 IMPLANT
GLOVE SURG ENC TEXT LTX SZ7.5 (GLOVE) ×1 IMPLANT
GLOVE SURG UNDER POLY LF SZ8 (GLOVE) ×1
LENS IOL TECNIS EYHANCE 15.5 (Intraocular Lens) IMPLANT
NDL FILTER BLUNT 18X1 1/2 (NEEDLE) ×1 IMPLANT
NEEDLE FILTER BLUNT 18X1 1/2 (NEEDLE) ×1 IMPLANT
SYR 3ML LL SCALE MARK (SYRINGE) ×1 IMPLANT
WATER STERILE IRR 250ML POUR (IV SOLUTION) ×1 IMPLANT

## 2022-05-05 NOTE — Discharge Instructions (Signed)

## 2022-05-05 NOTE — Transfer of Care (Signed)
Immediate Anesthesia Transfer of Care Note  Patient: Daniel Kline  Procedure(s) Performed: CATARACT EXTRACTION PHACO AND INTRAOCULAR LENS PLACEMENT (IOC) RIGHT (Right: Eye)  Patient Location: PACU  Anesthesia Type: MAC  Level of Consciousness: awake, alert  and patient cooperative  Airway and Oxygen Therapy: Patient Spontanous Breathing and Patient connected to supplemental oxygen  Post-op Assessment: Post-op Vital signs reviewed, Patient's Cardiovascular Status Stable, Respiratory Function Stable, Patent Airway and No signs of Nausea or vomiting  Post-op Vital Signs: Reviewed and stable  Complications: There were no known notable events for this encounter.

## 2022-05-05 NOTE — Anesthesia Preprocedure Evaluation (Signed)
Anesthesia Evaluation  Patient identified by MRN, date of birth, ID band Patient awake    Reviewed: Allergy & Precautions, NPO status , Patient's Chart, lab work & pertinent test results  History of Anesthesia Complications Negative for: history of anesthetic complications  Airway Mallampati: III  TM Distance: >3 FB Neck ROM: full    Dental  (+) Upper Dentures, Lower Dentures, Dental Advidsory Given   Pulmonary sleep apnea , former smoker,    Pulmonary exam normal        Cardiovascular hypertension, negative cardio ROS Normal cardiovascular exam     Neuro/Psych PSYCHIATRIC DISORDERS negative neurological ROS     GI/Hepatic negative GI ROS, Neg liver ROS,   Endo/Other  negative endocrine ROSdiabetes  Renal/GU negative Renal ROS  negative genitourinary   Musculoskeletal   Abdominal   Peds  Hematology negative hematology ROS (+)   Anesthesia Other Findings Past Medical History: No date: Anemia     Comment:  H/O No date: Anxiety No date: Arthritis No date: Atrial flutter ([place])     Comment:  a. 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 forearm near wrist. Severe atypia,               close to peripheral margin. 06/21/2018: Dysplastic nevus     Comment:  Upper back right paraspinal. Severe atypia, peripheral               margin involved. Excised 07/11/2018, margins free. 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 forearm. SCCis, hypertrophic.  Past Surgical History: No date: BACK SURGERY     Comment:  LUMBAR 2019: CARDIAC ELECTROPHYSIOLOGY STUDY AND ABLATION 04/04/2019: COLONOSCOPY WITH PROPOFOL; N/A     Comment:  Procedure: COLONOSCOPY WITH PROPOFOL;  Surgeon: [place],               Benay Pike, MD;  Location: [facility] ENDOSCOPY;  Service:               Gastroenterology;  Laterality: N/A; 02/01/2020: I & D EXTREMITY; Right     Comment:  Procedure: IRRIGATION AND DEBRIDEMENT EXTREMITY with               poly exchange;  Surgeon: Dereck Leep, MD;  Location:               [facility] ORS;  Service: Orthopedics;  Laterality: Right; No date: INCISION AND DRAINAGE     Comment:  BACK-MRSA INFECTION AFTER BACK SURGERY 01/28/2020: KNEE ARTHROPLASTY; Right     Comment:  Procedure: COMPUTER ASSISTED TOTAL KNEE ARTHROPLASTY;  Surgeon: Dereck Leep, MD;  Location: [facility] ORS;                Service: Orthopedics;  Laterality: Right; No date: MOUTH SURGERY No date: right elbow surgery No date: right knee surgery No date: right shoulder surgery     Comment:  from polio damage No date: TONSILLECTOMY 04/2016: TOTAL HIP ARTHROPLASTY; Bilateral  BMI    Body Mass Index: 37.93 kg/m      Reproductive/Obstetrics negative OB ROS                             Anesthesia Physical Anesthesia Plan  ASA: 3  Anesthesia Plan: MAC   Post-op Pain Management:    Induction: Intravenous  PONV Risk Score and Plan: 1 and Propofol infusion and TIVA  Airway Management Planned: Natural Airway and Nasal Cannula  Additional Equipment:   Intra-op Plan:   Post-operative  Plan:   Informed Consent: I have reviewed the patients History and Physical, chart, labs and discussed the procedure including the risks, benefits and alternatives for the proposed anesthesia with the patient or authorized representative who has indicated his/her understanding and acceptance.     Dental Advisory Given  Plan Discussed with: Anesthesiologist, CRNA and Surgeon  Anesthesia Plan Comments: (Patient consented for risks of anesthesia including but not limited to:  - adverse reactions to medications - damage to eyes, teeth, lips or other oral mucosa - nerve damage due to positioning  - sore throat or hoarseness - Damage to heart, brain, nerves, lungs, other parts of body or loss of life  Patient voiced understanding.)        Anesthesia Quick Evaluation

## 2022-05-05 NOTE — Op Note (Signed)
LOCATION:  [place]   PREOPERATIVE DIAGNOSIS:    Nuclear sclerotic cataract right eye. H25.11   POSTOPERATIVE DIAGNOSIS:  Nuclear sclerotic cataract right eye.     PROCEDURE:  Phacoemusification with posterior chamber intraocular lens placement of the right eye   ULTRASOUND TIME: Procedure(s) with comments: CATARACT EXTRACTION PHACO AND INTRAOCULAR LENS PLACEMENT (IOC) RIGHT (Right) - Diabetic 10.42 01:13.4  LENS:   Implant Name Type Inv. Item Serial No. Manufacturer Lot No. LRB No. Used Action  LENS IOL TECNIS EYHANCE 15.5 - [id number] Intraocular Lens LENS IOL TECNIS EYHANCE 15.5 [id number] [employer]  Right 1 Implanted         SURGEON:  Wyonia Hough, MD   ANESTHESIA:  Topical with tetracaine drops and 2% Xylocaine jelly, augmented with 1% preservative-free intracameral lidocaine.    COMPLICATIONS:  None.   DESCRIPTION OF PROCEDURE:  The patient was identified in the holding room and transported to the operating room and placed in the supine position under the operating microscope.  The right eye was identified as the operative eye and it was prepped and draped in the usual sterile ophthalmic fashion.   A 1 millimeter clear-corneal paracentesis was made at the 12:00 position.  0.5 ml of preservative-free 1% lidocaine was injected into the anterior chamber. The anterior chamber was filled with Viscoat viscoelastic.  A 2.4 millimeter keratome was used to make a near-clear corneal incision at the 9:00 position.  A curvilinear capsulorrhexis was made with a cystotome and capsulorrhexis forceps.  Balanced salt solution was used to hydrodissect and hydrodelineate the nucleus.   Phacoemulsification was then used in stop and chop fashion to remove the lens nucleus and epinucleus.  The remaining cortex was then removed using the irrigation and aspiration handpiece. Provisc was then placed into the capsular bag to distend it for lens placement.  A lens was then injected  into the capsular bag.  The remaining viscoelastic was aspirated.   Wounds were hydrated with balanced salt solution.  The anterior chamber was inflated to a physiologic pressure with balanced salt solution.  No wound leaks were noted. Cefuroxime 0.1 ml of a '10mg'$ /ml solution was injected into the anterior chamber for a dose of 1 mg of intracameral antibiotic at the completion of the case.   Timolol and Brimonidine drops were applied to the eye.  The patient was taken to the recovery room in stable condition without complications of anesthesia or surgery.   Sedona Wenk 05/05/2022, 9:18 AM

## 2022-05-05 NOTE — H&P (Signed)
[facility]   Primary Care Physician:  [name], MD Ophthalmologist: Dr. Leandrew Koyanagi  Pre-Procedure History & Physical: HPI:  Daniel Kline is a 76 y.o. male here for ophthalmic surgery.   Past Medical History:  Diagnosis Date   Anemia    H/O   Anxiety    Arthritis    Atrial flutter ([place])    a. s/p post ablation in 04/2017   Chronic kidney disease    Complication of anesthesia    CTCL (cutaneous T-cell lymphoma) (HCC)    Diabetes mellitus without complication ([place])    Dysplastic nevus 12/19/2017   Right distal lat. forearm near wrist. Severe atypia, close to peripheral margin.   Dysplastic nevus 06/21/2018   Upper back right paraspinal. Severe atypia, peripheral margin involved. Excised 07/11/2018, margins free.   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 forearm. SCCis, hypertrophic.    Past Surgical History:  Procedure Laterality Date   BACK SURGERY     LUMBAR   CARDIAC ELECTROPHYSIOLOGY STUDY AND ABLATION  2019   COLONOSCOPY WITH PROPOFOL N/A 04/04/2019   Procedure: COLONOSCOPY WITH PROPOFOL;  Surgeon: Toledo, Benay Pike, MD;  Location: [facility] ENDOSCOPY;  Service: Gastroenterology;  Laterality: N/A;   I & D EXTREMITY Right 02/01/2020   Procedure: IRRIGATION AND DEBRIDEMENT EXTREMITY with poly exchange;  Surgeon: Dereck Leep, MD;  Location: [facility] ORS;  Service: Orthopedics;  Laterality: Right;   INCISION AND DRAINAGE     BACK-MRSA INFECTION AFTER BACK SURGERY   KNEE  ARTHROPLASTY Right 01/28/2020   Procedure: COMPUTER ASSISTED TOTAL KNEE ARTHROPLASTY;  Surgeon: Dereck Leep, MD;  Location: [facility] ORS;  Service: Orthopedics;  Laterality: Right;   MOUTH SURGERY     right elbow surgery     right knee surgery     right shoulder surgery     from polio damage   TONSILLECTOMY     TOTAL HIP ARTHROPLASTY Bilateral 04/2016    Prior to Admission medications   Medication Sig Start Date End Date Taking? Authorizing Provider  amLODipine (NORVASC) 5 MG tablet Take 1 tablet (5 mg total) by mouth daily. 09/21/21  Yes Viviana Simpler I, MD  carvedilol (COREG) 3.125 MG tablet TAKE 1 TABLET BY MOUTH 2 TIMES DAILY WITH A MEAL. 03/03/22  Yes [name], MD  Cholecalciferol (VITAMIN D-3 PO) Take 10,000 Units by mouth daily.    Yes [provider]  clobetasol cream (TEMOVATE) 9.14 % Apply 1 application topically 2 (two) times daily as needed (irritation).  06/27/15  Yes [provider]  clorazepate (TRANXENE) 7.5 MG tablet TAKE 1 TABLET BY MOUTH TWICE A DAY AS NEEDED FOR ANXIETY 03/29/22  Yes Viviana Simpler I, MD  diclofenac Sodium (VOLTAREN) 1 % GEL APPLY 2 GRAMS TOPICALLY FOUR TIMES DAILY 11/05/21  Yes [name], MD  Elastic Bandages & Supports (MEDICAL COMPRESSION STOCKINGS) MISC 2 Units by Does not apply route daily.  08/19/17  Yes Merlyn Lot, MD  EPINEPHrine 0.3 mg/0.3 mL IJ SOAJ injection Inject 0.3 mLs (0.3 mg total) into the muscle as needed for anaphylaxis. 11/22/19  Yes [name], MD  Ferrous Sulfate (IRON PO) Take 1 tablet by mouth at bedtime.   Yes [provider]  folic acid (FOLVITE) 1 MG tablet Take 1 mg by mouth daily. Every day except day that he takes methotrexate 06/11/20  Yes [provider]  furosemide (LASIX) 40 MG tablet Take 1 tablet (40 mg total) by mouth 2 (two) times daily. 05/11/21  Yes Gollan, Kathlene November, MD  gabapentin (NEURONTIN) 300 MG capsule TAKE 1 CAPSULE BY MOUTH 3  TIMES DAILY 09/08/21   Yes Viviana Simpler I, MD  glipiZIDE (GLUCOTROL) 5 MG tablet TAKE ONE-HALF TABLET BY  MOUTH TWICE DAILY BEFORE  MEALS 12/02/21  Yes [name], MD  HYDROcodone-acetaminophen (NORCO/VICODIN) 5-325 MG tablet TAKE 1 TABLET BY MOUTH EVERY SIX HOURS AS NEEDED FOR PAIN 04/08/22  Yes [name], MD  hydrOXYzine (ATARAX) 25 MG tablet TAKE ONE TABLET EVERY EIGHT HOURS AS NEEDED 11/25/21  Yes [name], MD  isosorbide mononitrate (IMDUR) 60 MG 24 hr tablet Take 1 tablet (60 mg total) by mouth daily before lunch. 05/11/21  Yes Minna Merritts, MD  ketoconazole (NIZORAL) 2 % cream Apply twice daily to under arm and feet until clear then one additional week 09/10/21  Yes Moye, [place], MD  liraglutide (VICTOZA) 18 MG/3ML SOPN Inject 1.2 mg into the skin daily. 12/24/21  Yes [name], MD  losartan (COZAAR) 100 MG tablet TAKE 1 TABLET BY MOUTH DAILY 02/25/22  Yes Gollan, Kathlene November, MD  MAGNESIUM PO Take by mouth.   Yes [provider]  metFORMIN (GLUCOPHAGE) 500 MG tablet TAKE 1 TABLET BY MOUTH TWICE DAILY 07/08/21  Yes [name], MD  methotrexate (RHEUMATREX) 2.5 MG tablet Take 10 mg by mouth once a week. 03/09/22  Yes [provider]  metolazone (ZAROXOLYN) 2.5 MG tablet Take 7.5 mg by mouth once a week. Thurs   Yes [provider]  Multiple Vitamins-Minerals (MULTIVITAL) tablet Take 1 tablet by mouth daily.   Yes [provider]  mupirocin ointment (BACTROBAN) 2 % APPLY TO CRUSTS ON THE LEFT LOWER LEG AND LEFT AXILLA (UNDER ARM) EVERY DAY 12/23/21  Yes Ralene Bathe, MD  [facility] ULTRA test strip CHECK BLOOD SUGAR 3 TIMES DAILY AS DIRECTED 12/02/20  Yes Crecencio Mc, MD  rosuvastatin (CRESTOR) 20 MG tablet TAKE 1 TABLET BY MOUTH  DAILY 02/03/22  Yes [name], MD  timolol (TIMOPTIC) 0.5 % ophthalmic solution Place 1 drop into both eyes 2 (two) times daily. 01/11/20  Yes [provider]  triamcinolone cream (KENALOG) 0.1 %  Apply to aa L axilla and L lower leg QD PRN. 10/06/21  Yes Ralene Bathe, MD    Allergies as of 03/16/2022 - Review Complete 02/03/2022  Allergen Reaction Noted   Bee venom Anaphylaxis 06/22/2018   Oxycodone Other (See Comments) 12/20/2011   Hydromorphone Other (See Comments) 02/03/2017   Hydroxychloroquine  08/31/2021   Zolpidem Other (See Comments) 12/08/2011    Family History  Adopted: Yes    Social History   Socioeconomic History   Marital status: Married    Spouse name: Not on file   Number of children: 0   Years of education: Not on file   Highest education level: Not on file  Occupational History   Occupation:  Nature conservation officer    Comment: 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   Occupation: Cytogeneticist    Comment: Retired   Occupation: Warden/ranger    Comment: Retired  Tobacco Use   Smoking status: Former    Types: Cigars    Quit date: 09/23/1990    Years since quitting: 31.6    Passive exposure: Past (as a child)   Smokeless tobacco: Former    Quit date: 02/25/2006  Vaping Use   Vaping Use: Never used  Substance and Sexual Activity   Alcohol use: No    Alcohol/week: 0.0 standard drinks of alcohol   Drug use: No   Sexual activity: Yes    Partners: Female  Other Topics Concern   Not on file  Social History Narrative   Has living will   Wife is health care POA---then brother or sister   Would accept resuscitation attempts but no prolonged ventilation or tube feeds   Social Determinants of Health   Financial Resource Strain: Low Risk  (07/20/2017)   Overall Financial Resource Strain (CARDIA)    Difficulty of Paying Living Expenses: Not hard at all  Food Insecurity: No Food Insecurity (07/20/2017)   Hunger Vital Sign    Worried About Running Out of Food in the Last Year: Never true    [place] in the Last Year: Never true  Transportation Needs: No Transportation Needs (07/20/2017)   PRAPARE - Radiographer, therapeutic (Medical): No    Lack of Transportation (Non-Medical): No  Physical Activity: Not on file  Stress: Not on file  Social Connections: Unknown (07/20/2017)   Social Connection and Isolation Panel [NHANES]    Frequency of Communication with Friends and Family: Patient refused    Frequency of Social Gatherings with Friends and Family: Patient refused    Attends Religious Services: Patient refused    Active Member of Clubs or Organizations: Patient refused    Attends Archivist Meetings: Patient refused    Marital Status: Patient refused  Intimate Partner Violence: Not At Risk (07/20/2017)   Humiliation, Afraid, Rape, and Kick questionnaire    Fear of Current or Ex-Partner: No    Emotionally Abused: No    Physically Abused: No    Sexually Abused: No    Review of Systems: See HPI, otherwise negative ROS  Physical Exam: BP (!) 185/91   Pulse (!) 59   Temp 98.1 F (36.7 C) (Temporal)   Resp 18   Ht '5\' 6"'$  (1.676 m)   Wt 106.6 kg   SpO2 98%   BMI 37.93 kg/m  General:   Alert,  pleasant and cooperative in NAD Head:  Normocephalic and atraumatic. Lungs:  Clear to auscultation.    Heart:  Regular rate and rhythm.   Impression/Plan: Daniel Kline is here for ophthalmic surgery.  Risks, benefits, limitations, and alternatives regarding ophthalmic surgery have been reviewed with the patient.  Questions have been answered.  All parties agreeable.   Leandrew Koyanagi, MD  05/05/2022, 8:26 AM

## 2022-05-05 NOTE — Anesthesia Postprocedure Evaluation (Signed)
Anesthesia Post Note  Patient: Daniel Kline  Procedure(s) Performed: CATARACT EXTRACTION PHACO AND INTRAOCULAR LENS PLACEMENT (IOC) RIGHT (Right: Eye)     Patient location during evaluation: PACU Anesthesia Type: MAC Level of consciousness: awake and alert Pain management: pain level controlled Vital Signs Assessment: post-procedure vital signs reviewed and stable Respiratory status: spontaneous breathing, nonlabored ventilation, respiratory function stable and patient connected to nasal cannula oxygen Cardiovascular status: stable and blood pressure returned to baseline Postop Assessment: no apparent nausea or vomiting Anesthetic complications: no   There were no known notable events for this encounter.  [name]

## 2022-05-06 ENCOUNTER — Encounter: Payer: Self-pay | Admitting: Ophthalmology

## 2022-05-10 DIAGNOSIS — D631 Anemia in chronic kidney disease: Secondary | ICD-10-CM | POA: Diagnosis not present

## 2022-05-10 DIAGNOSIS — N179 Acute kidney failure, unspecified: Secondary | ICD-10-CM | POA: Diagnosis not present

## 2022-05-10 DIAGNOSIS — N2581 Secondary hyperparathyroidism of renal origin: Secondary | ICD-10-CM | POA: Diagnosis not present

## 2022-05-10 DIAGNOSIS — N1832 Chronic kidney disease, stage 3b: Secondary | ICD-10-CM | POA: Diagnosis not present

## 2022-05-10 DIAGNOSIS — I10 Essential (primary) hypertension: Secondary | ICD-10-CM | POA: Diagnosis not present

## 2022-05-10 DIAGNOSIS — E1122 Type 2 diabetes mellitus with diabetic chronic kidney disease: Secondary | ICD-10-CM | POA: Diagnosis not present

## 2022-05-11 ENCOUNTER — Other Ambulatory Visit: Payer: Self-pay | Admitting: Internal Medicine

## 2022-05-11 NOTE — Telephone Encounter (Signed)
Name of Medication: Hydrocodone Name of Pharmacy: [facility] or Written Date and Quantity: 04-08-22 #60 Last Office Visit and Type: 03-29-22 Next Office Visit and Type: 07-15-22 Last Controlled Substance Agreement Date: 06-18-21 Last UDS: 06-18-21

## 2022-05-12 NOTE — Telephone Encounter (Signed)
Error

## 2022-06-01 DIAGNOSIS — Z79899 Other long term (current) drug therapy: Secondary | ICD-10-CM | POA: Diagnosis not present

## 2022-06-01 DIAGNOSIS — C8400 Mycosis fungoides, unspecified site: Secondary | ICD-10-CM | POA: Diagnosis not present

## 2022-06-01 NOTE — Progress Notes (Deleted)
Cardiology Office Note    Date:  06/01/2022   ID:  Daniel Kline, DOB 04/18/1946, MRN [id number]  PCP:  [name], MD  Cardiologist:  Ida Rogue, MD  Electrophysiologist:  None   Chief Complaint: Follow up  History of Present Illness:   Daniel Kline is a 76 y.o. male with history of ***  Subsequent Zio patch demonstrated ***  ***   Labs independently reviewed: 05/2022 - BUN 60, serum creatinine 2.74, potassium 3.7, albumin 4.2 04/2022 - Hgb 11.6, PLT 131, magnesium 2.3  Past Medical History:  Diagnosis Date   Anemia    H/O   Anxiety    Arthritis    Atrial flutter ([place])    a. s/p post ablation in 04/2017   Chronic kidney disease    Complication of anesthesia    CTCL (cutaneous T-cell lymphoma) ([place])    Diabetes mellitus without complication ([place])    Dysplastic nevus 12/19/2017   Right distal lat. forearm near wrist. Severe atypia, close to peripheral margin.   Dysplastic nevus 06/21/2018   Upper back right paraspinal. Severe atypia, peripheral margin involved. Excised 07/11/2018, margins free.   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 forearm. SCCis, hypertrophic.    Past Surgical History:  Procedure Laterality Date   BACK SURGERY     LUMBAR   CARDIAC ELECTROPHYSIOLOGY STUDY AND ABLATION  2019   CATARACT EXTRACTION W/PHACO Right 05/05/2022   Procedure: CATARACT EXTRACTION PHACO AND INTRAOCULAR LENS PLACEMENT ([place]) RIGHT;  Surgeon: Leandrew Koyanagi, MD;  Location:  [place];  Service: Ophthalmology;  Laterality: Right;  Diabetic 10.42 01:13.4   COLONOSCOPY WITH PROPOFOL N/A 04/04/2019   Procedure: COLONOSCOPY WITH PROPOFOL;  Surgeon: Toledo, Benay Pike, MD;  Location: [facility] END[facility]COPY;  Service: Gastroenterology;  Laterality: N/A;   I & D EXTREMITY Right 02/01/2020   Procedure: IRRIGATION AND DEBRIDEMENT EXTREMITY with poly exchange;  Surgeon: Dereck Leep, MD;  Location: [facility] ORS;  Service: Orthopedics;  Laterality: Right;   INCISION AND DRAINAGE     BACK-MRSA INFECTION AFTER BACK SURGERY   KNEE ARTHROPLASTY Right 01/28/2020   Procedure: COMPUTER ASSISTED TOTAL KNEE ARTHROPLASTY;  Surgeon: Dereck Leep, MD;  Location: [facility] ORS;  Service: Orthopedics;  Laterality: Right;   MOUTH SURGERY     right elbow surgery     right knee surgery     right shoulder surgery     from polio damage   TONSILLECTOMY     TOTAL HIP ARTHROPLASTY Bilateral 04/2016    Current Medications: No outpatient medications have been marked as taking for the 06/04/22 encounter (Appointment) with Rise Mu, PA-C.    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   Occupation: Cytogeneticist    Comment: Retired   Occupation: Warden/ranger    Comment: Retired  Tobacco Use   Smoking status: Former    Types: Cigars    Quit date: 09/23/1990    Years since quitting: 31.7    Passive exposure: Past (as a child)   Smokeless tobacco: Former    Quit date: 02/25/2006  Vaping Use   Vaping Use: Never used  Substance and Sexual Activity   Alcohol use: No    Alcohol/week: 0.0 standard drinks of alcohol   Drug use: No    Sexual activity: Yes    Partners: Female  Other Topics Concern   Not on file  Social History Narrative   Has living will   Wife is health care POA---then brother or sister   Would accept resuscitation attempts but no prolonged ventilation or tube feeds   Social Determinants of Health   Financial Resource Strain: Low Risk  (07/20/2017)   Overall Financial Resource Strain (CARDIA)    Difficulty of Paying Living Expenses: Not hard at all  Food Insecurity: No Food Insecurity (07/20/2017)   Hunger Vital Sign    Worried About Running Out of Food in the Last Year: Never true    [place] in the Last Year: Never true  Transportation Needs: No Transportation Needs (07/20/2017)   PRAPARE - Hydrologist (Medical): No    Lack of Transportation (Non-Medical): No  Physical Activity: Not on file  Stress: Not on file  Social Connections: Unknown (07/20/2017)   Social Connection and Isolation Panel [NHANES]    Frequency of Communication with Friends and Family: Patient refused    Frequency of Social Gatherings with Friends and Family: Patient refused    Attends Religious Services: Patient refused    Marine scientist or Organizations: Patient refused    Attends Archivist Meetings: Patient refused    Marital Status: Patient refused     Family History:  The patient's family history is not on file. He was adopted.  ROS:   ROS   EKGs/Labs/Other Studies Reviewed:    Studies reviewed were summarized above. The additional studies were reviewed today: ***  EKG:  EKG is ordered today.  The EKG ordered today demonstrates ***  Recent Labs: 01/21/2022: ALT 17 01/24/2022: B Natriuretic Peptide 64.7 01/25/2022: Magnesium 2.2 02/03/2022: BUN 16; Creatinine, Ser 1.58; Hemoglobin 11.5; Platelets 339.0; Potassium 4.4; Sodium 138  Recent Lipid Panel    Component Value Date/Time   CHOL 100 03/10/2020 1632   TRIG 148.0 03/10/2020 1632   HDL 31.90  (L) 03/10/2020 1632   CHOLHDL 3 03/10/2020 1632   VLDL 29.6 03/10/2020 1632   LDLCALC 38 03/10/2020 1632   LDLDIRECT 94.0 01/27/2017 1037    PHYSICAL EXAM:    VS:  There were no vitals taken for this visit.  BMI: There is no height or weight on file to calculate BMI.  Physical Exam  Wt Readings from Last 3 Encounters:  05/05/22 235 lb (106.6 kg)  03/30/22 238 lb 6.4 oz (108.1 kg)  03/29/22 240 lb (108.9 kg)     ASSESSMENT & PLAN:   ***   {Are you ordering a CV Procedure (e.g. stress test, cath, DCCV, TEE, etc)?   Press F2        :[id number]}  Disposition: F/u with Dr. Rockey Situ or an APP in ***.   Medication Adjustments/Labs and Tests Ordered: Current medicines are reviewed at length with the patient today.  Concerns regarding medicines are outlined above. Medication changes, Labs and Tests ordered today are summarized above and listed in the Patient Instructions accessible in Encounters.   Signed, Christell Faith, PA-C 06/01/2022 1:32 PM     [address] [place], [place] [address] [phone]

## 2022-06-02 ENCOUNTER — Other Ambulatory Visit: Payer: Self-pay | Admitting: Internal Medicine

## 2022-06-03 NOTE — Telephone Encounter (Signed)
Last filled 03-29-22 #60 Last OV 03-29-22 Next OV 07-15-22 [facility]

## 2022-06-04 ENCOUNTER — Ambulatory Visit: Payer: Medicare Other | Admitting: Physician Assistant

## 2022-06-04 DIAGNOSIS — Z961 Presence of intraocular lens: Secondary | ICD-10-CM | POA: Diagnosis not present

## 2022-06-08 DIAGNOSIS — I739 Peripheral vascular disease, unspecified: Secondary | ICD-10-CM | POA: Diagnosis not present

## 2022-06-08 DIAGNOSIS — Z96651 Presence of right artificial knee joint: Secondary | ICD-10-CM | POA: Diagnosis not present

## 2022-06-11 ENCOUNTER — Other Ambulatory Visit: Payer: Self-pay | Admitting: Internal Medicine

## 2022-06-14 ENCOUNTER — Other Ambulatory Visit: Payer: Self-pay | Admitting: Internal Medicine

## 2022-06-14 NOTE — Telephone Encounter (Signed)
Name of Medication: Hydrocodone Name of Pharmacy: [place] or Written Date and Quantity: 05-12-22 #60 Last Office Visit and Type: 03-29-22 Next Office Visit and Type: 07-15-22 Last Controlled Substance Agreement Date: 06-18-21 Last UDS: 06-18-21

## 2022-06-16 ENCOUNTER — Other Ambulatory Visit: Payer: Self-pay | Admitting: Internal Medicine

## 2022-06-18 NOTE — Progress Notes (Signed)
[facility] Quality Team Note  Name: Daniel Kline Date of Birth: [birth year] MRN: [id number] Date: 06/18/2022  [facility] Quality Team has reviewed this patient's chart, please see recommendations below:  [facility] Quality Other; Pt has open quality gap for KED, needs Micro/Creat urine test to close this. Please address at upcoming appointment.

## 2022-06-24 ENCOUNTER — Ambulatory Visit: Payer: Medicare Other | Admitting: Dermatology

## 2022-06-24 DIAGNOSIS — L814 Other melanin hyperpigmentation: Secondary | ICD-10-CM

## 2022-06-24 DIAGNOSIS — T1490XA Injury, unspecified, initial encounter: Secondary | ICD-10-CM

## 2022-06-24 DIAGNOSIS — Z79899 Other long term (current) drug therapy: Secondary | ICD-10-CM

## 2022-06-24 DIAGNOSIS — Z86018 Personal history of other benign neoplasm: Secondary | ICD-10-CM

## 2022-06-24 DIAGNOSIS — D229 Melanocytic nevi, unspecified: Secondary | ICD-10-CM

## 2022-06-24 DIAGNOSIS — L578 Other skin changes due to chronic exposure to nonionizing radiation: Secondary | ICD-10-CM

## 2022-06-24 DIAGNOSIS — L304 Erythema intertrigo: Secondary | ICD-10-CM | POA: Diagnosis not present

## 2022-06-24 DIAGNOSIS — Z1283 Encounter for screening for malignant neoplasm of skin: Secondary | ICD-10-CM | POA: Diagnosis not present

## 2022-06-24 DIAGNOSIS — C84A0 Cutaneous T-cell lymphoma, unspecified, unspecified site: Secondary | ICD-10-CM | POA: Diagnosis not present

## 2022-06-24 DIAGNOSIS — Z85828 Personal history of other malignant neoplasm of skin: Secondary | ICD-10-CM | POA: Diagnosis not present

## 2022-06-24 DIAGNOSIS — Z8589 Personal history of malignant neoplasm of other organs and systems: Secondary | ICD-10-CM

## 2022-06-24 DIAGNOSIS — L821 Other seborrheic keratosis: Secondary | ICD-10-CM

## 2022-06-24 MED ORDER — KETOCONAZOLE 2 % EX CREA: 1.0000 | TOPICAL_CREAM | Freq: Every day | CUTANEOUS | 11 refills | Status: DC

## 2022-06-24 MED ORDER — MUPIROCIN 2 % EX OINT: 1.0000 | TOPICAL_OINTMENT | Freq: Every day | CUTANEOUS | 11 refills | Status: DC

## 2022-06-24 NOTE — Progress Notes (Signed)
Follow-Up Visit   Subjective  Daniel Kline is a 76 y.o. male who presents for the following: Total body skin exam (Hx of Dysplastic Nevi) and CTCL (Skn, Methotrexate 2.'5mg'$  6 po q wk as prescribed by Dr. Irish Elders, [place] 0.1% cr prn, has not done light box for a while). The patient presents for Total-Body Skin Exam (TBSE) for skin cancer screening and mole check.  The patient has spots, moles and lesions to be evaluated, some may be new or changing and the patient has concerns that these could be cancer.   The following portions of the chart were reviewed this encounter and updated as appropriate:   Tobacco  Allergies  Meds  Problems  Med Hx  Surg Hx  Fam Hx     Review of Systems:  No other skin or systemic complaints except as noted in HPI or Assessment and Plan.  Objective  Well appearing patient in no apparent distress; mood and affect are within normal limits.  A full examination was performed including scalp, head, eyes, ears, nose, lips, neck, chest, axillae, abdomen, back, buttocks, bilateral upper extremities, bilateral lower extremities, hands, feet, fingers, toes, fingernails, and toenails. All findings within normal limits unless otherwise noted below.  skin Minimal pink areas post neck, post thighs, pretibial areas otherwise clear  L pretibial Traumatic crust  Left Axilla Clear today   Assessment & Plan  Cutaneous T-cell lymphoma, unspecified body region [facility]) skin Chronic and persistent condition with duration or expected duration over one year. Condition is symptomatic / bothersome to patient. Not to goal.  Cont f/u with Dr. Irish Elders  Restart light box if flared, otherwise do not have to do for now Cont Methotrexate and Folic asid as prescribed by Dr. Irish Elders Cont [facility] 0.1% cr qd up to 5d/wk aa prn flares, avoid face, groin, axilla   Topical steroids (such as triamcinolone, fluocinolone, fluocinonide, mometasone, clobetasol, halobetasol, betamethasone,  hydrocortisone) can cause thinning and lightening of the skin if they are used for too long in the same area. Your physician has selected the right strength medicine for your problem and area affected on the body. Please use your medication only as directed by your physician to prevent side effects.  Traumatic injury L pretibial Start Mupirocin oint qd to L lower leg until healed  mupirocin ointment (BACTROBAN) 2 % - L pretibial Apply 1 Application topically daily. Qd to crust on leg until healed, or to any open sores  Intertrigo Left Axilla Intertrigo is a chronic recurrent rash that occurs in skin fold areas that may be associated with friction; heat; moisture; yeast; fungus; and bacteria.  It is exacerbated by increased movement / activity; sweating; and higher atmospheric temperature.  If not improving, consider biopsy to R/O CTCL Prn flares start Ketoconazole cr qd until resolved Prn flares start Mupirocin oint qd until resolved  ketoconazole (NIZORAL) 2 % cream - Left Axilla Apply 1 Application topically daily. Apply nightly to rash under arms as needed for flares  Lentigines - Scattered tan macules - Due to sun exposure - Benign-appearing, observe - Recommend daily broad spectrum sunscreen SPF 30+ to sun-exposed areas, reapply every 2 hours as needed. - Call for any changes  Seborrheic Keratoses - Stuck-on, waxy, tan-brown papules and/or plaques  - Benign-appearing - Discussed benign etiology and prognosis. - Observe - Call for any changes  Melanocytic Nevi - Tan-brown and/or pink-flesh-colored symmetric macules and papules - Benign appearing on exam today - Observation - Call clinic for new or changing  moles - Recommend daily use of broad spectrum spf 30+ sunscreen to sun-exposed areas.   Hemangiomas - Red papules - Discussed benign nature - Observe - Call for any changes  Actinic Damage - Chronic condition, secondary to cumulative UV/sun exposure - diffuse  scaly erythematous macules with underlying dyspigmentation - Recommend daily broad spectrum sunscreen SPF 30+ to sun-exposed areas, reapply every 2 hours as needed.  - Staying in the shade or wearing long sleeves, sun glasses (UVA+UVB protection) and wide brim hats (4-inch brim around the entire circumference of the hat) are also recommended for sun protection.  - Call for new or changing lesions.  Skin cancer screening performed today.   History of Dysplastic Nevi - No evidence of recurrence today - Recommend regular full body skin exams - Recommend daily broad spectrum sunscreen SPF 30+ to sun-exposed areas, reapply every 2 hours as needed.  - Call if any new or changing lesions are noted between office visits  - R distal lat forearm, upper back right paraspinal  History of Squamous Cell Carcinoma of the Skin - No evidence of recurrence today - No lymphadenopathy - Recommend regular full body skin exams - Recommend daily broad spectrum sunscreen SPF 30+ to sun-exposed areas, reapply every 2 hours as needed.  - Call if any new or changing lesions are noted between office visits - R mid lat forearm  Return in about 6 months (around 12/23/2022) for CTCL.  I, Othelia Pulling, RMA, am acting as scribe for Sarina Ser, MD . Documentation: I have reviewed the above documentation for accuracy and completeness, and I agree with the above.  Sarina Ser, MD

## 2022-06-24 NOTE — Patient Instructions (Addendum)
CTCL Continue Triamcinolone cream once daily as needed for flares, avoid face, groin, axilla Continue Methotrexate and Folic acid as prescribed by Dr. Irish Elders May restart light box if starting to flare  For Traumatic Injury on Left lower leg Start Mupirocin ointment daily until healed  For Intertrigo under Left arm When flared restart Ketoconazole 2% cream once daily until resolved and then as needed for flares When flared restart Mupirocin ointment once daily until resolved and then as needed for flares   Due to recent changes in healthcare laws, you may see results of your pathology and/or laboratory studies on MyChart before the doctors have had a chance to review them. We understand that in some cases there may be results that are confusing or concerning to you. Please understand that not all results are received at the same time and often the doctors may need to interpret multiple results in order to provide you with the best plan of care or course of treatment. Therefore, we ask that you please give [place] 2 business days to thoroughly review all your results before contacting the office for clarification. Should we see a critical lab result, you will be contacted sooner.   If You Need Anything After Your Visit  If you have any questions or concerns for your doctor, please call our main line at [phone] and press option 4 to reach your doctor's medical assistant. If no one answers, please leave a voicemail as directed and we will return your call as soon as possible. Messages left after 4 pm will be answered the following business day.   You may also send [place] a message via Brave. We typically respond to MyChart messages within 1-2 business days.  For prescription refills, please ask your pharmacy to contact our office. Our fax number is [phone].  If you have an urgent issue when the clinic is closed that cannot wait until the next business day, you can page your doctor at the number  below.    Please note that while we do our best to be available for urgent issues outside of office hours, we are not available 24/7.   If you have an urgent issue and are unable to reach [place], you may choose to seek medical care at your doctor's office, retail clinic, urgent care center, or emergency room.  If you have a medical emergency, please immediately call 911 or go to the emergency department.  Pager Numbers  - Dr. Nehemiah Massed: [phone]  - Dr. Laurence Ferrari: [phone]  - Dr. Nicole Kindred: [phone]  In the event of inclement weather, please call our main line at [phone] for an update on the status of any delays or closures.  Dermatology Medication Tips: Please keep the boxes that topical medications come in in order to help keep track of the instructions about where and how to use these. Pharmacies typically print the medication instructions only on the boxes and not directly on the medication tubes.   If your medication is too expensive, please contact our office at [phone] option 4 or send [place] a message through English.   We are unable to tell what your co-pay for medications will be in advance as this is different depending on your insurance coverage. However, we may be able to find a substitute medication at lower cost or fill out paperwork to get insurance to cover a needed medication.   If a prior authorization is required to get your medication covered by your insurance company, please allow [place] 1-2 business  days to complete this process.  Drug prices often vary depending on where the prescription is filled and some pharmacies may offer cheaper prices.  The website www.goodrx.com contains coupons for medications through different pharmacies. The prices here do not account for what the cost may be with help from insurance (it may be cheaper with your insurance), but the website can give you the price if you did not use any insurance.  - You can print the associated coupon  and take it with your prescription to the pharmacy.  - You may also stop by our office during regular business hours and pick up a GoodRx coupon card.  - If you need your prescription sent electronically to a different pharmacy, notify our office through [facility] or by phone at [phone] option 4.     Si Usted Necesita Algo Despus de Su Visita  Tambin puede enviarnos un mensaje a travs de Pharmacist, community. Por lo general respondemos a los mensajes de MyChart en el transcurso de 1 a 2 das hbiles.  Para renovar recetas, por favor pida a su farmacia que se ponga en contacto con nuestra oficina. Harland Dingwall de fax es [place]  [phone].  Si tiene un asunto urgente cuando la clnica est cerrada y que no puede esperar hasta el siguiente da hbil, puede llamar/localizar a su doctor(a) al nmero que aparece a continuacin.   Por favor, tenga en cuenta que aunque hacemos todo lo posible para estar disponibles para asuntos urgentes fuera del horario de [place], no estamos disponibles las 24 horas del da, los 7 das de la [place].   Si tiene un problema urgente y no puede comunicarse con nosotros, puede optar por buscar atencin mdica  en el consultorio de su doctor(a), en una clnica privada, en un centro de atencin urgente o en una sala de emergencias.  Si tiene Engineering geologist, por favor llame inmediatamente al 911 o vaya a la sala de emergencias.  Nmeros de bper  - Dr. Nehemiah Massed: [phone]  - Dra. Moye: [phone]  - Dra. Nicole Kindred: [phone]  En caso de inclemencias del [place], por favor llame a Johnsie Kindred principal al [phone] para una actualizacin sobre el [place] de cualquier retraso o cierre.  Consejos para la medicacin en dermatologa: Por favor, guarde las cajas en las que vienen los medicamentos de uso tpico para ayudarle a seguir las instrucciones sobre dnde y cmo usarlos. Las farmacias generalmente imprimen las instrucciones del medicamento  slo en las cajas y no directamente en los tubos del [place].   Si su medicamento es muy caro, por favor, pngase en contacto con Zigmund Daniel llamando al [phone] y presione la opcin 4 o envenos un mensaje a travs de Pharmacist, community.   No podemos decirle cul ser su copago por los medicamentos por adelantado ya que esto es diferente dependiendo de la cobertura de su seguro. Sin embargo, es posible que podamos encontrar un medicamento sustituto a Electrical engineer un formulario para que el seguro cubra el medicamento que se considera necesario.   Si se requiere una autorizacin previa para que su compaa de seguros [place] su medicamento, por favor permtanos de 1 a 2 das hbiles para completar este proceso.  Los precios de los medicamentos varan con frecuencia dependiendo del Environmental consultant de dnde se surte la receta y alguna farmacias pueden ofrecer precios ms baratos.  El sitio web www.goodrx.com tiene cupones para medicamentos de Airline pilot. Los precios aqu no tienen en cuenta lo que podra costar con la  ayuda del seguro (puede ser ms barato con su seguro), pero el sitio web puede darle el precio si no Field seismologist.  - Puede imprimir el cupn correspondiente y llevarlo con su receta a la farmacia.  - Tambin puede pasar por nuestra oficina durante el horario de atencin regular y Charity fundraiser una tarjeta de cupones de GoodRx.  - Si necesita que su receta se enve electrnicamente a una farmacia diferente, informe a nuestra oficina a travs de MyChart de [place] o por telfono llamando al [phone] y presione la opcin 4.

## 2022-07-07 ENCOUNTER — Telehealth: Payer: Self-pay

## 2022-07-07 NOTE — Telephone Encounter (Signed)
Error

## 2022-07-08 NOTE — Progress Notes (Signed)
Cardiology Office Note    Date:  07/16/22   ID:  MARQUEST GUNKEL, DOB 1946/06/19, MRN [id number]  PCP:  [name], MD  Cardiologist:  Ida Rogue, MD  Electrophysiologist:  None   Chief Complaint: Follow-up for heart palpitations  History of Present Illness:   Daniel Kline is a 76 y.o. male with history of atrial flutter status post ablation in 04/2017 at outside hospital, diastolic dysfunction, cutaneous T-cell lymphoma, DM2, CKD stage IIIb, HTN, HLD, chronic low back pain, and OSA on CPAP who presents for follow up of atrial flutter.   He was incidentally found to be in atrial flutter when he presented for prior knee replacement at outside hospital.  Echo at that time demonstrated an EF of 60 to 65% with left ventricular septal hypertrophy, normal wall motion, mildly dilated left atrium, normal RV systolic function and ventricular cavity size, and a PASP of 41 mmHg.  He subsequently underwent successful ablation on 04/28/2017. He has not been maintained on anticoagulation.    He established care with our office in 01/2019 for preoperative cardiac risk stratification for a right total knee arthroplasty and was without symptoms of angina or decompensation.  He was felt to be acceptable risk for surgery without further testing indicated.  Post operative course was complicated by bleeding requiring arthrotomy, evacuation of hematoma, and I&D.  He was last seen in our office in 05/2021 and was without symptoms of angina or decompensation.   He was admitted to the hospital in 01/2022 with neutropenic sepsis secondary to [place].  During the admission, he was initiated on apixaban for concern of A-fib noted on twelve-lead EKG.  Upon further review with Christell Faith PA-C and Dr. Rockey Situ, there was no evidence of A-fib with tracings showing baseline wandering along with sinus rhythm with PACs.   He underwent Echo on 03/17/2022 demonstrated an EF of 60 to 65%, no regional wall motion  abnormalities, grade 1 diastolic dysfunction, normal RV systolic function, no significant valvular abnormalities, and normal size/structure aorta.  He was last seen in the office on 03/30/2022 and was without symptoms of angina or decompensation.  Prior EKGs were reviewed by  his primary cardiologist, which showed no evidence of A-fib.  Given this finding, it was recommended he discontinue apixaban.  Zio patch was recommended and remains pending at this time.  He had reported a mechanical fall while taking out his trash can with contusion noted to the head.  He did not suffer LOC.  Stat head CT was without acute intracranial abnormality with a left parietal hematoma noted.    He presents today for follow up of palpitations. He received his zio monitor, but feels his wife may have subsequently threw it away, and he never wore it. He denies chest pain, palpitations, dyspnea, pnd, orthopnea, n, v, dizziness, syncope, edema, weight gain, or early satiety. EKG today showed SR with wandering atrial pacemaker, rhythm strip confirmed the same. BP was initially elevated 140/90, re-checked 126/78.     Labs independently reviewed: 12/5 hgb 10.3, HCT 31, Na 140, K 4.2, Cr 2.7, gfr 24 05/2022 - BUN 60, serum creatinine 2.74, potassium 3.7, albumin 4.2 04/2022 - Hgb 11.6, PLT 131, magnesium 2.3 01/2022 - AST/ALT normal 12/2021 - A1c 5.8 03/2020 - TC 100, TG 148, HDL 31, LDL 38  Past Medical History:  Diagnosis Date   Anemia    H/O   Anxiety    Arthritis    Atrial flutter ([place])  a. s/p post ablation in 04/2017   Chronic kidney disease    Complication of anesthesia    CTCL (cutaneous T-cell lymphoma) (HCC)    Diabetes mellitus without complication ([place])    Dysplastic nevus 12/19/2017   Right distal lat. forearm near wrist. Severe atypia, close to peripheral margin.   Dysplastic nevus 06/21/2018   Upper back right paraspinal. Severe atypia, peripheral margin involved. Excised 07/11/2018, margins free.    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 forearm. SCCis, hypertrophic.    Past Surgical History:  Procedure Laterality Date   BACK SURGERY     LUMBAR   CARDIAC ELECTROPHYSIOLOGY STUDY AND ABLATION  2019   CATARACT EXTRACTION W/PHACO Right 05/05/2022   Procedure: CATARACT EXTRACTION PHACO AND INTRAOCULAR LENS PLACEMENT ([place]) RIGHT;  Surgeon: Leandrew Koyanagi, MD;  Location: [facility];  Service: Ophthalmology;  Laterality: Right;  Diabetic 10.42 01:13.4   COLONOSCOPY WITH PROPOFOL N/A 04/04/2019   Procedure: COLONOSCOPY WITH PROPOFOL;  Surgeon: Toledo, Benay Pike, MD;  Location: [facility];  Service: Gastroenterology;  Laterality: N/A;   I & D EXTREMITY Right 02/01/2020   Procedure: IRRIGATION AND DEBRIDEMENT EXTREMITY with poly exchange;  Surgeon: Dereck Leep, MD;  Location: [facility];  Service: Orthopedics;  Laterality: Right;   INCISION AND DRAINAGE     BACK-MRSA INFECTION AFTER BACK SURGERY   KNEE ARTHROPLASTY Right 01/28/2020   Procedure: COMPUTER ASSISTED TOTAL KNEE ARTHROPLASTY;  Surgeon: Dereck Leep, MD;  Location: [facility];  Service: Orthopedics;  Laterality: Right;   MOUTH SURGERY     right elbow surgery     right knee surgery     right shoulder surgery     from polio damage   TONSILLECTOMY     TOTAL HIP ARTHROPLASTY Bilateral 04/2016    Current Medications: Current Meds  Medication Sig   amLODipine (NORVASC) 5 MG tablet TAKE 1 TABLET BY MOUTH DAILY   carvedilol (COREG) 3.125 MG  tablet TAKE 1 TABLET BY MOUTH 2 TIMES DAILY WITH A MEAL.   Cholecalciferol (VITAMIN D-3 PO) Take 10,000 Units by mouth daily.    clobetasol cream (TEMOVATE) 4.74 % Apply 1 application topically 2 (two) times daily as needed (irritation).    clorazepate (TRANXENE) 7.5 MG tablet TAKE 1 TABLET BY MOUTH TWICE A DAY AS NEEDED FOR ANXIETY   diclofenac Sodium (VOLTAREN) 1 % GEL APPLY 2 GRAMS TOPICALLY FOUR TIMES DAILY   Elastic Bandages & Supports (MEDICAL COMPRESSION STOCKINGS) MISC 2 Units by Does not apply route daily.   EPINEPHrine 0.3 mg/0.3 mL IJ SOAJ injection Inject 0.3 mLs (0.3 mg total) into the muscle as needed for anaphylaxis.   Ferrous Sulfate (IRON PO) Take 1 tablet by mouth at bedtime.   fluconazole (DIFLUCAN) 150 MG tablet Take 150 mg by mouth once a week. For 3 weeks   folic acid (FOLVITE) 1 MG tablet Take 1 mg by mouth daily. Every day except day  that he takes methotrexate   furosemide (LASIX) 40 MG tablet Take 1 tablet (40 mg total) by mouth 2 (two) times daily.   gabapentin (NEURONTIN) 300 MG capsule TAKE 1 CAPSULE BY MOUTH 3 TIMES  DAILY   glipiZIDE (GLUCOTROL) 5 MG tablet TAKE ONE-HALF TABLET BY  MOUTH TWICE DAILY BEFORE  MEALS   HYDROcodone-acetaminophen (NORCO/VICODIN) 5-325 MG tablet TAKE 1 TABLET BY MOUTH EVERY SIX HOURS AS NEEDED FOR PAIN   hydrOXYzine (ATARAX) 25 MG tablet TAKE ONE TABLET EVERY EIGHT HOURS AS NEEDED   isosorbide mononitrate (IMDUR) 60 MG 24 hr tablet Take 1 tablet (60 mg total) by mouth daily before lunch.   ketoconazole (NIZORAL) 2 % cream Apply 1 Application topically daily. Apply nightly to rash under arms as needed for flares   liraglutide (VICTOZA) 18 MG/3ML SOPN Inject 1.2 mg into the skin daily.   losartan (COZAAR) 100 MG tablet TAKE 1 TABLET BY MOUTH DAILY   MAGNESIUM PO Take by mouth.   metFORMIN (GLUCOPHAGE) 500 MG tablet TAKE 1 TABLET BY MOUTH TWICE DAILY   methotrexate (RHEUMATREX) 2.5 MG tablet Take 10 mg by mouth once a week.   metolazone  (ZAROXOLYN) 2.5 MG tablet Take 7.5 mg by mouth once a week. Thurs   Multiple Vitamins-Minerals (MULTIVITAL) tablet Take 1 tablet by mouth daily.   mupirocin ointment (BACTROBAN) 2 % Apply 1 Application topically daily. Qd to crust on leg until healed, or to any open sores   ONETOUCH ULTRA test strip CHECK BLOOD SUGAR 3 TIMES DAILY AS DIRECTED   rosuvastatin (CRESTOR) 20 MG tablet TAKE 1 TABLET BY MOUTH  DAILY   timolol (TIMOPTIC) 0.5 % ophthalmic solution Place 1 drop into both eyes 2 (two) times daily.   triamcinolone cream (KENALOG) 0.1 % Apply to aa L axilla and L lower leg QD PRN.    Allergies:   Bee venom, Oxycodone, Hydromorphone, Hydroxychloroquine, and Zolpidem   Social History   Socioeconomic History   Marital status: Married    Spouse name: Not on file   Number of children: 0   Years of education: Not on file   Highest education level: Not on file  Occupational History   Occupation: Nature conservation officer    Comment: 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   Occupation: Cytogeneticist    Comment: Retired   Occupation: Warden/ranger    Comment: Retired  Tobacco Use   Smoking status: Former    Types: Cigars    Quit date: 09/23/1990    Years since quitting: 31.8    Passive exposure: Past (as a child)   Smokeless tobacco: Former    Quit date: 02/25/2006  Vaping Use   Vaping Use: Never used  Substance and Sexual Activity   Alcohol use: No    Alcohol/week: 0.0 standard drinks of alcohol   Drug use: No   Sexual activity: Yes    Partners: Female  Other Topics Concern   Not on file  Social History Narrative   Has living will   Wife is health care POA---then brother or sister   Would accept resuscitation attempts but no prolonged ventilation or tube feeds   Social Determinants of Health   Financial Resource Strain: Low Risk  (07/20/2017)   Overall Financial Resource Strain (CARDIA)    Difficulty of Paying Living Expenses: Not hard at all  Food  Insecurity: No Food Insecurity (07/20/2017)   Hunger Vital Sign    Worried About Running Out of Food in the Last Year: Never true  Ran Out of Food in the Last Year: Never true  Transportation Needs: No Transportation Needs (07/20/2017)   PRAPARE - Hydrologist (Medical): No    Lack of Transportation (Non-Medical): No  Physical Activity: Not on file  Stress: Not on file  Social Connections: Unknown (07/20/2017)   Social Connection and Isolation Panel [NHANES]    Frequency of Communication with Friends and Family: Patient refused    Frequency of Social Gatherings with Friends and Family: Patient refused    Attends Religious Services: Patient refused    Marine scientist or Organizations: Patient refused    Attends Archivist Meetings: Patient refused    Marital Status: Patient refused     Family History:  The patient's family history is not on file. He was adopted.  ROS:   Review of Systems  Constitutional: Negative.   HENT: Negative.    Eyes:  Negative for blurred vision and double vision.  Respiratory:  Negative for cough, hemoptysis, shortness of breath and wheezing.   Cardiovascular:  Positive for leg swelling. Negative for chest pain, palpitations, orthopnea and PND.  Gastrointestinal: Negative.   Genitourinary: Negative.   Musculoskeletal: Negative.   Skin: Negative.   Neurological:  Negative for dizziness and loss of consciousness.  Endo/Heme/Allergies: Negative.   Psychiatric/Behavioral: Negative.       EKGs/Labs/Other Studies Reviewed:    Studies reviewed were summarized above. The additional studies were reviewed today:  Zio patch 03/2022: Results pending __________  2D echo 03/17/2022: 1. Left ventricular ejection fraction, by estimation, is 60 to 65%. The  left ventricle has normal function. The left ventricle has no regional  wall motion abnormalities. Left ventricular diastolic parameters are  consistent with  Grade I diastolic  dysfunction (impaired relaxation).   2. Right ventricular systolic function is normal. The right ventricular  size is not well visualized.   3. The mitral valve is grossly normal. No evidence of mitral valve  regurgitation.   4. The aortic valve was not well visualized. Aortic valve regurgitation  is not visualized. __________   2D echo 04/20/2017: SUMMARY  The left ventricular size is normal.  Basal left ventricular septal hypertrophy  LV ejection fraction = 60-65%.   Left ventricular systolic function is normal.  No segmental wall motion abnormalities seen in the left ventricle  The right ventricle is normal in size and function.  The left atrium is mildly dilated.  There is no significant valvular stenosis or regurgitation  Mild pulmonary hypertension.  Estimated right ventricular systolic pressure is 41 mmHg.  There is no pericardial effusion.  There is no comparison study available.    EKG:  EKG is ordered today.  The EKG ordered today demonstrates SR with wandering pacemaker, rhythm strip confirms.   Recent Labs: 01/21/2022: ALT 17 01/24/2022: B Natriuretic Peptide 64.7 01/25/2022: Magnesium 2.2 02/03/2022: BUN 16; Creatinine, Ser 1.58; Hemoglobin 11.5; Platelets 339.0; Potassium 4.4; Sodium 138  Recent Lipid Panel    Component Value Date/Time   CHOL 100 03/10/2020 1632   TRIG 148.0 03/10/2020 1632   HDL 31.90 (L) 03/10/2020 1632   CHOLHDL 3 03/10/2020 1632   VLDL 29.6 03/10/2020 1632   LDLCALC 38 03/10/2020 1632   LDLDIRECT 94.0 01/27/2017 1037    PHYSICAL EXAM:    VS:  BP 126/78   Pulse 69   Ht '5\' 6"'$  (1.676 m)   Wt 245 lb (111.1 kg)   SpO2 96%   BMI 39.54 kg/m   BMI:  Body mass index is 39.54 kg/m.  Physical Exam Constitutional:      General: He is not in acute distress.    Appearance: He is obese.  Neck:     Vascular: No carotid bruit.  Cardiovascular:     Rate and Rhythm: Normal rate and regular rhythm.     Pulses: Normal pulses.      Heart sounds: Normal heart sounds. No murmur heard.    No gallop.  Pulmonary:     Effort: Pulmonary effort is normal.  Abdominal:     General: Bowel sounds are normal.     Hernia: A hernia is present.  Musculoskeletal:     Right lower leg: Edema (+2) present.     Left lower leg: Edema (+2) present.  Skin:    General: Skin is warm and dry.  Neurological:     Mental Status: He is alert.     Wt Readings from Last 3 Encounters:  07/16/22 245 lb (111.1 kg)  07/15/22 244 lb (110.7 kg)  05/05/22 235 lb (106.6 kg)     ASSESSMENT & PLAN:   Atrial Flutter: Status post ablation in 04/2017. EKG today reveals SR with wandering atrial pacemaker, rhythm strip confirms. He has not noticed any palpitations. Zio monitor received but apparently his wife mistook it for trash and he threw away. He is willing to attempt to wear again. Will order live AT zio x 14 days.   HTN: Blood pressure today initially elevated 140/90, rechecked 126/78. Continue with norvasc 5 mg once daily, carvedilol 3.125 mg twice daily, furosemide 40 mg twice daily, cozaar 100 mg daily, imdur 60 mg daily.   HLD: LDL 38. On Crestor 20 mg daily. Managed by Dr. Silvio Pate. Well controlled.   CKD stage IIIb: Cr 2.7, gfr 24. Will see nephrologist in the next few weeks. Discussed avoiding nephrotoxic agents and good BP management.          Disposition: F/u with Dr. Rockey Situ or an APP in 1 month after zio monitor.   Medication Adjustments/Labs and Tests Ordered: Current medicines are reviewed at length with the patient today.  Concerns regarding medicines are outlined above. Medication changes, Labs and Tests ordered today are summarized above and listed in the Patient Instructions accessible in Encounters.   Signed, [name], NP    [facility] [address] [place],  [address] [phone]

## 2022-07-13 ENCOUNTER — Ambulatory Visit: Payer: Medicare Other | Admitting: Physician Assistant

## 2022-07-13 ENCOUNTER — Encounter: Payer: Self-pay | Admitting: Dermatology

## 2022-07-13 DIAGNOSIS — C84A0 Cutaneous T-cell lymphoma, unspecified, unspecified site: Secondary | ICD-10-CM | POA: Diagnosis not present

## 2022-07-15 ENCOUNTER — Ambulatory Visit (INDEPENDENT_AMBULATORY_CARE_PROVIDER_SITE_OTHER): Payer: Medicare Other | Admitting: Internal Medicine

## 2022-07-15 ENCOUNTER — Encounter: Payer: Self-pay | Admitting: Internal Medicine

## 2022-07-15 ENCOUNTER — Telehealth: Payer: Self-pay | Admitting: Cardiovascular Disease

## 2022-07-15 VITALS — BP 138/88 | HR 78 | Temp 97.9°F | Ht 65.0 in | Wt 244.0 lb

## 2022-07-15 DIAGNOSIS — F1120 Opioid dependence, uncomplicated: Secondary | ICD-10-CM | POA: Diagnosis not present

## 2022-07-15 DIAGNOSIS — G894 Chronic pain syndrome: Secondary | ICD-10-CM

## 2022-07-15 DIAGNOSIS — Z23 Encounter for immunization: Secondary | ICD-10-CM | POA: Diagnosis not present

## 2022-07-15 DIAGNOSIS — N1832 Chronic kidney disease, stage 3b: Secondary | ICD-10-CM | POA: Diagnosis not present

## 2022-07-15 DIAGNOSIS — E1121 Type 2 diabetes mellitus with diabetic nephropathy: Secondary | ICD-10-CM | POA: Diagnosis not present

## 2022-07-15 LAB — POCT GLYCOSYLATED HEMOGLOBIN (HGB A1C): Hemoglobin A1C: 6.2 % — AB (ref 4.0–5.6)

## 2022-07-15 NOTE — Assessment & Plan Note (Signed)
Lab Results  Component Value Date   HGBA1C 6.2 (A) 07/15/2022   Still well controlled Victoza 1.'2mg'$  daily--didn't tolerate 1.8 Glipizide 2.'5mg'$  bid and metformin 500 bid---no hypoglycemia

## 2022-07-15 NOTE — Progress Notes (Signed)
Subjective:    Patient ID: Daniel Kline, male    DOB: Apr 05, 1946, 76 y.o.   MRN: [id number]  HPI Here for follow up of chronic pain and diabetes  Reviewed that he has chronic anemia --likely from the CKD  Checks sugars occasionally Last was 93--typically under 100 No hypoglycemic reactions--just gets sleepy at times   Pain is about the same Tries to limit the hydrocodone to once a day--but twice some days It helps  Continues with nephrologist GFR last was 33  Current Outpatient Medications on File Prior to Visit  Medication Sig Dispense Refill   amLODipine (NORVASC) 5 MG tablet TAKE 1 TABLET BY MOUTH DAILY 90 tablet 3   carvedilol (COREG) 3.125 MG tablet TAKE 1 TABLET BY MOUTH 2 TIMES DAILY WITH A MEAL. 180 tablet 3   Cholecalciferol (VITAMIN D-3 PO) Take 10,000 Units by mouth daily.      clobetasol cream (TEMOVATE) 5.02 % Apply 1 application topically 2 (two) times daily as needed (irritation).      clorazepate (TRANXENE) 7.5 MG tablet TAKE 1 TABLET BY MOUTH TWICE A DAY AS NEEDED FOR ANXIETY 60 tablet 0   diclofenac Sodium (VOLTAREN) 1 % GEL APPLY 2 GRAMS TOPICALLY FOUR TIMES DAILY 100 g 11   Elastic Bandages & Supports (MEDICAL COMPRESSION STOCKINGS) MISC 2 Units by Does not apply route daily. 2 each 0   EPINEPHrine 0.3 mg/0.3 mL IJ SOAJ injection Inject 0.3 mLs (0.3 mg total) into the muscle as needed for anaphylaxis. 1 each 5   Ferrous Sulfate (IRON PO) Take 1 tablet by mouth at bedtime.     fluconazole (DIFLUCAN) 150 MG tablet Take 150 mg by mouth once a week. For 3 weeks     folic acid (FOLVITE) 1 MG tablet Take 1 mg by mouth daily. Every day except day that he takes methotrexate     furosemide (LASIX) 40 MG tablet Take 1 tablet (40 mg total) by mouth 2 (two) times daily. 180 tablet 3   gabapentin (NEURONTIN) 300 MG capsule TAKE 1 CAPSULE BY MOUTH 3 TIMES  DAILY 300 capsule 2   glipiZIDE (GLUCOTROL) 5 MG tablet TAKE ONE-HALF TABLET BY  MOUTH TWICE DAILY BEFORE  MEALS 90  tablet 3   HYDROcodone-acetaminophen (NORCO/VICODIN) 5-325 MG tablet TAKE 1 TABLET BY MOUTH EVERY SIX HOURS AS NEEDED FOR PAIN 60 tablet 0   hydrOXYzine (ATARAX) 25 MG tablet TAKE ONE TABLET EVERY EIGHT HOURS AS NEEDED 90 tablet 3   isosorbide mononitrate (IMDUR) 60 MG 24 hr tablet Take 1 tablet (60 mg total) by mouth daily before lunch. 90 tablet 3   ketoconazole (NIZORAL) 2 % cream Apply 1 Application topically daily. Apply nightly to rash under arms as needed for flares 60 g 11   liraglutide (VICTOZA) 18 MG/3ML SOPN Inject 1.2 mg into the skin daily. 1 mL 0   losartan (COZAAR) 100 MG tablet TAKE 1 TABLET BY MOUTH DAILY 90 tablet 0   MAGNESIUM PO Take by mouth.     metFORMIN (GLUCOPHAGE) 500 MG tablet TAKE 1 TABLET BY MOUTH TWICE DAILY 180 tablet 3   methotrexate (RHEUMATREX) 2.5 MG tablet Take 10 mg by mouth once a week.     metolazone (ZAROXOLYN) 2.5 MG tablet Take 7.5 mg by mouth once a week. Thurs     Multiple Vitamins-Minerals (MULTIVITAL) tablet Take 1 tablet by mouth daily.     mupirocin ointment (BACTROBAN) 2 % Apply 1 Application topically daily. Qd to crust on leg until healed,  or to any open sores 22 g 11   ONETOUCH ULTRA test strip CHECK BLOOD SUGAR 3 TIMES DAILY AS DIRECTED 100 each 1   rosuvastatin (CRESTOR) 20 MG tablet TAKE 1 TABLET BY MOUTH  DAILY 90 tablet 3   timolol (TIMOPTIC) 0.5 % ophthalmic solution Place 1 drop into both eyes 2 (two) times daily.     triamcinolone cream (KENALOG) 0.1 % Apply to aa L axilla and L lower leg QD PRN. 80 g 0   No current facility-administered medications on file prior to visit.    Allergies  Allergen Reactions   Bee Venom Anaphylaxis   Oxycodone Other (See Comments)    Delusions   Hydromorphone Other (See Comments)    hallucinating   Hydroxychloroquine     Other reaction(s): Other (See Comments) He broke out really badly.   Zolpidem Other (See Comments)    Past Medical History:  Diagnosis Date   Anemia    H/O   Anxiety     Arthritis    Atrial flutter ([place])    a. s/p post ablation in 04/2017   Chronic kidney disease    Complication of anesthesia    CTCL (cutaneous T-cell lymphoma) (HCC)    Diabetes mellitus without complication ([place])    Dysplastic nevus 12/19/2017   Right distal lat. forearm near wrist. Severe atypia, close to peripheral margin.   Dysplastic nevus 06/21/2018   Upper back right paraspinal. Severe atypia, peripheral margin involved. Excised 07/11/2018, margins free.   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 forearm. SCCis, hypertrophic.    Past Surgical History:  Procedure Laterality Date   BACK SURGERY     LUMBAR   CARDIAC ELECTROPHYSIOLOGY STUDY AND ABLATION  2019   CATARACT EXTRACTION W/PHACO Right 05/05/2022   Procedure: CATARACT EXTRACTION PHACO AND INTRAOCULAR LENS PLACEMENT ([place]) RIGHT;  Surgeon: Leandrew Koyanagi, MD;  Location: [facility];  Service: Ophthalmology;  Laterality: Right;  Diabetic 10.42 01:13.4   COLONOSCOPY WITH PROPOFOL N/A 04/04/2019   Procedure: COLONOSCOPY WITH PROPOFOL;  Surgeon: Tol[place], Benay Pike, MD;  Location: [facility];  Service: Gastroenterology;  Laterality: N/A;   I & D EXTREMITY Right 02/01/2020   Procedure: IRRIGATION AND DEBRIDEMENT EXTREMITY with poly exchange;  Surgeon: Dereck Leep, MD;  Location: [facility];  Service: Orthopedics;  Laterality: Right;   INCISION AND DRAINAGE     BACK-MRSA INFECTION AFTER BACK SURGERY   KNEE ARTHROPLASTY Right 01/28/2020    Procedure: COMPUTER ASSISTED TOTAL KNEE ARTHROPLASTY;  Surgeon: Dereck Leep, MD;  Location: [facility];  Service: Orthopedics;  Laterality: Right;   MOUTH SURGERY     right elbow surgery     right knee surgery     right shoulder surgery     from polio damage   TONSILLECTOMY     TOTAL HIP ARTHROPLASTY Bilateral 04/2016    Family History  Adopted: Yes    Social History  Socioeconomic History   Marital status: Married    Spouse name: Not on file   Number of children: 0   Years of education: Not on file   Highest education level: Not on file  Occupational History   Occupation: Nature conservation officer    Comment: 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   Occupation: Cytogeneticist    Comment: Retired   Occupation: Warden/ranger    Comment: Retired  Tobacco Use   Smoking status: Former    Types: Cigars    Quit date: 09/23/1990    Years since quitting: 31.8    Passive exposure: Past (as a child)   Smokeless tobacco: Former    Quit date: 02/25/2006  Vaping Use   Vaping Use: Never used  Substance and Sexual Activity   Alcohol use: No    Alcohol/week: 0.0 standard drinks of alcohol   Drug use: No   Sexual activity: Yes    Partners: Female  Other Topics Concern   Not on file  Social History Narrative   Has living will   Wife is health care POA---then brother or sister   Would accept resuscitation attempts but no prolonged ventilation or tube feeds   Social Determinants of Health   Financial Resource Strain: Low Risk  (07/20/2017)   Overall Financial Resource Strain (CARDIA)    Difficulty of Paying Living Expenses: Not hard at all  Food Insecurity: No [place] (07/20/2017)   Hunger Vital Sign    Worried About Running Out of Food in the Last Year: Never true    [place] in the Last Year: Never true  Transportation Needs: No Transportation Needs (07/20/2017)   PRAPARE - Hydrologist (Medical): No    Lack of  Transportation (Non-Medical): No  Physical Activity: Not on file  Stress: Not on file  Social Connections: Unknown (07/20/2017)   Social Connection and Isolation Panel [NHANES]    Frequency of Communication with Friends and Family: Patient refused    Frequency of Social Gatherings with Friends and Family: Patient refused    Attends Religious Services: Patient refused    Active Member of Clubs or Organizations: Patient refused    Attends Archivist Meetings: Patient refused    Marital Status: Patient refused  Intimate Partner Violence: Not At Risk (07/20/2017)   Humiliation, Afraid, Rape, and Kick questionnaire    Fear of Current or Ex-Partner: No    Emotionally Abused: No    Physically Abused: No    Sexually Abused: No   Review of Systems Sleeps well Appetite is not great--weight is stable    Objective:   Physical Exam Constitutional:      Appearance: Normal appearance.  Cardiovascular:     Rate and Rhythm: Normal rate and regular rhythm.     Pulses: Normal pulses.     Heart sounds: No murmur heard.    No gallop.  Pulmonary:     Effort: Pulmonary effort is normal.     Breath sounds: Normal breath sounds. No wheezing or rales.  Musculoskeletal:     Cervical back: Neck supple.     Comments: Thick calves without pitting  Lymphadenopathy:     Cervical: No cervical adenopathy.  Skin:    Comments: No foot lesions  Neurological:     Mental Status: He is alert.            Assessment & Plan:

## 2022-07-15 NOTE — Telephone Encounter (Signed)
Patient called in due to losing his heart monitor and didn't want to get fussed at. Advised that this happens and not to worry. Encouraged him to keep appointment scheduled tomorrow and we can't see if another one is needed. He was agreeable with plan and had no further questions at this time.

## 2022-07-15 NOTE — Assessment & Plan Note (Signed)
PDMP reviewed No concerns 

## 2022-07-15 NOTE — Assessment & Plan Note (Signed)
Has done well with the hydrocodone--once or twice a day

## 2022-07-15 NOTE — Assessment & Plan Note (Signed)
Last GFR 33 On losartan '100mg'$   Sees DR Candiss Norse

## 2022-07-15 NOTE — Telephone Encounter (Signed)
Patient is calling about his appt for tomorrow he wants to know if he needs to keep that appt.  He states he was to wear an "echo unit" two months ago and he can't find it.

## 2022-07-15 NOTE — Addendum Note (Signed)
Addended by: [name] on: 07/15/2022 04:59 PM   Modules accepted: Orders

## 2022-07-16 ENCOUNTER — Encounter: Payer: Self-pay | Admitting: Nurse Practitioner

## 2022-07-16 ENCOUNTER — Ambulatory Visit: Payer: Medicare Other | Attending: Physician Assistant | Admitting: Cardiology

## 2022-07-16 ENCOUNTER — Ambulatory Visit: Payer: Medicare Other

## 2022-07-16 VITALS — BP 126/78 | HR 69 | Ht 66.0 in | Wt 245.0 lb

## 2022-07-16 DIAGNOSIS — I499 Cardiac arrhythmia, unspecified: Secondary | ICD-10-CM

## 2022-07-16 DIAGNOSIS — N1832 Chronic kidney disease, stage 3b: Secondary | ICD-10-CM | POA: Diagnosis not present

## 2022-07-16 DIAGNOSIS — I480 Paroxysmal atrial fibrillation: Secondary | ICD-10-CM

## 2022-07-16 DIAGNOSIS — I10 Essential (primary) hypertension: Secondary | ICD-10-CM

## 2022-07-16 NOTE — Progress Notes (Signed)
[facility] Quality Team Note  Name: Daniel Kline Date of Birth: 11-06-1945 MRN: [id number] Date: 07/16/2022  [facility] Quality Team has reviewed this patient's chart, please see recommendations below:  [facility] Quality Other; (KED GAP: KIDNEY HEALTH EVALUATION- PATIENT NEEDS URINE MICROALBUMIN/CREATININE RATIO TEST COMPLETED BEFORE END OF YEAR FOR GAP CLOSURE.)

## 2022-07-16 NOTE — Patient Instructions (Signed)
Medication Instructions:  Your physician recommends that you continue on your current medications as directed. Please refer to the Current Medication list given to you today.  *If you need a refill on your cardiac medications before your next appointment, please call your pharmacy*   Lab Work: None If you have labs (blood work) drawn today and your tests are completely normal, you will receive your results only by: [place] (if you have MyChart) OR A paper copy in the mail If you have any lab test that is abnormal or we need to change your treatment, we will call you to review the results.   Testing/Procedures: Your physician has recommended that you wear a live Zio monitor.   This monitor is a medical device that records the heart's electrical activity. Doctors most often use these monitors to diagnose arrhythmias. Arrhythmias are problems with the speed or rhythm of the heartbeat. The monitor is a small device applied to your chest. You can wear one while you do your normal daily activities. While wearing this monitor if you have any symptoms to push the button and record what you felt. Once you have worn this monitor for the period of time provider prescribed (Usually 14 days), you will return the monitor device in the postage paid box. Once it is returned they will download the data collected and provide [place] with a report which the provider will then review and we will call you with those results. Important tips:  Avoid showering during the first 24 hours of wearing the monitor. Avoid excessive sweating to help maximize wear time. Do not submerge the device, no hot tubs, and no swimming pools. Keep any lotions or oils away from the patch. After 24 hours you may shower with the patch on. Take brief showers with your back facing the shower head.  Do not remove patch once it has been placed because that will interrupt data and decrease adhesive wear time. Push the button when you have  any symptoms and write down what you were feeling. Once you have completed wearing your monitor, remove and place into box which has postage paid and place in your outgoing mailbox.  If for some reason you have misplaced your box then call our office and we can provide another box and/or mail it off for you.  Follow-Up: At [facility], you and your health needs are our priority.  As part of our continuing mission to provide you with exceptional heart care, we have created designated Provider Care Teams.  These Care Teams include your primary Cardiologist (physician) and Advanced Practice Providers (APPs -  Physician Assistants and Nurse Practitioners) who all work together to provide you with the care you need, when you need it.  We recommend signing up for the patient portal called "MyChart".  Sign up information is provided on this After Visit Summary.  MyChart is used to connect with patients for Virtual Visits (Telemedicine).  Patients are able to view lab/test results, encounter notes, upcoming appointments, etc.  Non-urgent messages can be sent to your provider as well.   To learn more about what you can do with MyChart, go to [email or web address].    Your next appointment:   1 month  The format for your next appointment:   In Person  Provider:   Ida Rogue, MD or Christell Faith, PA-C   Important Information About Sugar

## 2022-07-27 ENCOUNTER — Other Ambulatory Visit: Payer: Self-pay | Admitting: Internal Medicine

## 2022-07-27 NOTE — Telephone Encounter (Signed)
Name of Medication: Hydrocodone Name of Pharmacy: [facility] or Written Date and Quantity: 06-14-22 #60 Last Office Visit and Type: 07-15-22 Next Office Visit and Type: 10-25-22 Last Controlled Substance Agreement Date: 06-18-21 Last UDS: 06-18-21

## 2022-08-03 ENCOUNTER — Other Ambulatory Visit: Payer: Self-pay | Admitting: Internal Medicine

## 2022-08-03 DIAGNOSIS — Z7960 Long term (current) use of unspecified immunomodulators and immunosuppressants: Secondary | ICD-10-CM | POA: Diagnosis not present

## 2022-08-03 DIAGNOSIS — C84A0 Cutaneous T-cell lymphoma, unspecified, unspecified site: Secondary | ICD-10-CM | POA: Diagnosis not present

## 2022-08-03 DIAGNOSIS — M0609 Rheumatoid arthritis without rheumatoid factor, multiple sites: Secondary | ICD-10-CM | POA: Diagnosis not present

## 2022-08-04 ENCOUNTER — Other Ambulatory Visit: Payer: Self-pay | Admitting: Internal Medicine

## 2022-08-10 NOTE — Telephone Encounter (Signed)
Last filled 06-03-22 #60 Last OV 07-15-22 Next OV 10-25-22 [facility]

## 2022-08-13 DIAGNOSIS — C84A0 Cutaneous T-cell lymphoma, unspecified, unspecified site: Secondary | ICD-10-CM | POA: Diagnosis not present

## 2022-08-20 ENCOUNTER — Ambulatory Visit: Payer: Medicare Other | Attending: Nurse Practitioner | Admitting: Nurse Practitioner

## 2022-08-20 ENCOUNTER — Encounter: Payer: Self-pay | Admitting: Nurse Practitioner

## 2022-08-20 VITALS — BP 130/72 | HR 72 | Ht 66.0 in | Wt 233.0 lb

## 2022-08-20 DIAGNOSIS — E782 Mixed hyperlipidemia: Secondary | ICD-10-CM | POA: Diagnosis not present

## 2022-08-20 DIAGNOSIS — N1832 Chronic kidney disease, stage 3b: Secondary | ICD-10-CM | POA: Diagnosis not present

## 2022-08-20 DIAGNOSIS — I10 Essential (primary) hypertension: Secondary | ICD-10-CM | POA: Diagnosis not present

## 2022-08-20 DIAGNOSIS — I483 Typical atrial flutter: Secondary | ICD-10-CM | POA: Diagnosis not present

## 2022-08-20 DIAGNOSIS — E1121 Type 2 diabetes mellitus with diabetic nephropathy: Secondary | ICD-10-CM | POA: Diagnosis not present

## 2022-08-20 DIAGNOSIS — I5032 Chronic diastolic (congestive) heart failure: Secondary | ICD-10-CM | POA: Diagnosis not present

## 2022-08-20 NOTE — Progress Notes (Signed)
Office Visit    Patient Name: Daniel Kline Date of Encounter: 08/20/2022  Primary Care Provider:  [name], MD Primary Cardiologist:  Daniel Rogue, MD  Chief Complaint    77 y/o ? w/a  h/o atrial flutter status post catheter ablation in September 2018, chronic HFpEF, hypertension, hyperlipidemia, diabetes, stage III chronic kidney disease, cutaneous T-cell lymphoma, chronic low back pain, and sleep apnea on CPAP, presents for follow-up of atrial arrhythmias.  Past Medical History    Past Medical History:  Diagnosis Date   Anemia    H/O   Anxiety    Arthritis    Atrial flutter ([place])    a. s/p post ablation in 04/2017   Chronic kidney disease    Complication of anesthesia    CTCL (cutaneous T-cell lymphoma) (HCC)    Diabetes mellitus without complication ([place])    Dysplastic nevus 12/19/2017   Right distal lat. forearm near wrist. Severe atypia, close to peripheral margin.   Dysplastic nevus 06/21/2018   Upper back right paraspinal. Severe atypia, peripheral margin involved. Excised 07/11/2018, margins free.   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 forearm. SCCis, hypertrophic.   Past Surgical History:  Procedure Laterality Date   BACK SURGERY     LUMBAR   CARDIAC ELECTROPHYSIOLOGY STUDY AND ABLATION  2019   CATARACT EXTRACTION W/PHACO Right 05/05/2022   Procedure: CATARACT EXTRACTION PHACO AND INTRAOCULAR LENS PLACEMENT ([place]) RIGHT;  Surgeon: Daniel Koyanagi, MD;  Location:  [facility];  Service: Ophthalmology;  Laterality: Right;  Diabetic 10.42 01:13.4   COLONOSCOPY WITH PROPOFOL N/A 04/04/2019   Procedure: COLONOSCOPY WITH PROPOFOL;  Surgeon: Daniel Kline, Daniel Pike, MD;  Location: [facility];  Service: Gastroenterology;  Laterality: N/A;   I & D EXTREMITY Right 02/01/2020   Procedure: IRRIGATION AND DEBRIDEMENT EXTREMITY with poly exchange;  Surgeon: Daniel Leep, MD;  Location: [facility];  Service: Orthopedics;  Laterality: Right;   INCISION AND DRAINAGE     BACK-MRSA INFECTION AFTER BACK SURGERY   KNEE ARTHROPLASTY Right 01/28/2020   Procedure: COMPUTER ASSISTED TOTAL KNEE ARTHROPLASTY;  Surgeon: Daniel Leep, MD;  Location: [facility];  Service: Orthopedics;  Laterality: Right;   MOUTH SURGERY     right elbow surgery     right knee surgery     right shoulder surgery     from polio damage   TONSILLECTOMY     TOTAL HIP ARTHROPLASTY Bilateral 04/2016    Allergies  Allergies  Allergen Reactions   Bee Venom Anaphylaxis   Oxycodone Other (See Comments)    Delusions   Hydromorphone Other (See Comments)    hallucinating   Hydroxychloroquine     Other reaction(s): Other (See Comments) He broke out really badly.   Zolpidem Other (See Comments)    History of Present Illness    77 year old male with above past medical history including atrial flutter, chronic HFpEF, hypertension, hyperlipidemia, diabetes,  stage III chronic kidney disease, cutaneous T-cell lymphoma, chronic low back pain, and sleep apnea on CPAP.  He was diagnosed with atrial flutter at the time of presentation for knee surgery at an outside hospital in 2018.  An echocardiogram showed an EF of 60 to 65% with LVH and mild pulmonary hypertension (41 mmHg).  He subsequently underwent successful catheter ablation for atrial flutter in September 2018, and remained off of oral anticoagulation.  He established care with our office in June 2020 in the setting of preoperative risk  stratification, and subsequently underwent right total knee arthroplasty with complicated postoperative course including bleeding requiring arthrotomy, evacuation of hematoma, and I&D.  In June 2023, he was admitted to the hospital with neutropenic sepsis secondary to [place].  During admission, there was concern for possible atrial fibrillation on twelve-lead ECG and he was placed on apixaban therapy.  Cardiology was consulted and upon our review, his rhythm was felt to be consistent with sinus rhythm and PACs and Eliquis was subsequently discontinued.  An echocardiogram in March 17, 2022 showed an EF of 60 to 65% without regional wall motion abnormalities, grade 1 diastolic dysfunction, normal RV function, and no significant valvular abnormalities.  At office follow-up in August 2023, [name] was provided a ZIO monitor to further evaluate whether or not he experiences atrial fibrillation however, he believes his wife accidentally threw that out after it arrived.  He did not communicate this to [place] until his office follow-up on December 8, at which time he was feeling well.  Another ZIO monitor was ordered and patient follows up today to go over results.  However, patient has yet to place the monitor and brought with him today so that we can place it for him as he was concerned he would do something wrong if he tried himself.  Fortunately, he has been feeling well.  He has some degree of chronic, stable dyspnea on exertion but denies chest pain, palpitations, PND, orthopnea, dizziness, syncope, or early satiety.  He has chronic, stable bilateral lower extremity woody edema.  Home Medications    Current Outpatient Medications  Medication Sig Dispense Refill   amLODipine (NORVASC) 5 MG tablet TAKE 1 TABLET BY MOUTH DAILY 90 tablet 3   carvedilol (COREG) 3.125 MG tablet TAKE 1 TABLET BY MOUTH 2 TIMES DAILY WITH A MEAL. 180 tablet 3   Cholecalciferol (VITAMIN D-3 PO) Take 10,000 Units by mouth daily.       clobetasol cream (TEMOVATE) 5.09 % Apply 1 application topically 2 (two) times daily as needed (irritation).      clorazepate (TRANXENE) 7.5 MG tablet TAKE 1 TABLET BY MOUTH TWICE A DAY AS NEEDED FOR ANXIETY 60 tablet 0   diclofenac Sodium (VOLTAREN) 1 % GEL APPLY 2 GRAMS TOPICALLY FOUR TIMES DAILY 100 g 11   Elastic Bandages & Supports (MEDICAL COMPRESSION STOCKINGS) MISC 2 Units by Does not apply route daily. 2 each 0   EPINEPHrine 0.3 mg/0.3 mL IJ SOAJ injection Inject 0.3 mLs (0.3 mg total) into the muscle as needed for anaphylaxis. 1 each 5   Ferrous Sulfate (IRON PO) Take 1 tablet by mouth at bedtime.     folic acid (FOLVITE) 1 MG tablet Take 1 mg by mouth daily. Every day except day that he takes methotrexate     furosemide (LASIX) 40 MG tablet Take 1 tablet (40 mg total) by mouth 2 (two) times daily. 180 tablet 3   gabapentin (NEURONTIN) 300 MG capsule TAKE 1 CAPSULE BY MOUTH  3 TIMES  DAILY 300 capsule 2   glipiZIDE (GLUCOTROL) 5 MG tablet TAKE ONE-HALF TABLET BY  MOUTH TWICE DAILY BEFORE  MEALS 90 tablet 3   HYDROcodone-acetaminophen (NORCO/VICODIN) 5-325 MG tablet TAKE 1 TABLET BY MOUTH EVERY SIX HOURS AS NEEDED FOR PAIN 60 tablet 0   hydrOXYzine (ATARAX) 25 MG tablet TAKE ONE TABLET EVERY EIGHT HOURS AS NEEDED 90 tablet 3   isosorbide mononitrate (IMDUR) 60 MG 24 hr tablet Take 1 tablet (60 mg total) by mouth daily before lunch. 90 tablet 3   ketoconazole (NIZORAL) 2 % cream Apply 1 Application topically daily. Apply nightly to rash under arms as needed for flares 60 g 11   liraglutide (VICTOZA) 18 MG/3ML SOPN Inject 1.2 mg into the skin daily. 1 mL 0   losartan (COZAAR) 100 MG tablet TAKE 1 TABLET BY MOUTH DAILY 90 tablet 0   MAGNESIUM PO Take by mouth.     metFORMIN (GLUCOPHAGE) 500 MG tablet TAKE 1 TABLET BY MOUTH TWICE DAILY 200 tablet 3   methotrexate (RHEUMATREX) 2.5 MG tablet Take 10 mg by mouth once a week.     metolazone (ZAROXOLYN) 2.5 MG tablet Take 7.5 mg by mouth once a  week. Thurs     Multiple Vitamins-Minerals (MULTIVITAL) tablet Take 1 tablet by mouth daily.     mupirocin ointment (BACTROBAN) 2 % Apply 1 Application topically daily. Qd to crust on leg until healed, or to any open sores 22 g 11   ONETOUCH ULTRA test strip CHECK BLOOD SUGAR 3 TIMES DAILY AS DIRECTED 100 each 1   rosuvastatin (CRESTOR) 20 MG tablet TAKE 1 TABLET BY MOUTH  DAILY 90 tablet 3   timolol (TIMOPTIC) 0.5 % ophthalmic solution Place 1 drop into both eyes 2 (two) times daily.     triamcinolone cream (KENALOG) 0.1 % Apply to aa L axilla and L lower leg QD PRN. 80 g 0   No current facility-administered medications for this visit.     Review of Systems    Chronic, stable bilateral leg edema and dyspnea on exertion.  He denies chest pain, palpitations, PND, orthopnea, dizziness, syncope, or early satiety.  All other systems reviewed and are otherwise negative except as noted above.    Physical Exam    VS:  Ht '5\' 6"'$  (1.676 m)   Wt 233 lb (105.7 kg)   SpO2 96%   BMI 37.61 kg/m  , BMI Body mass index is 37.61 kg/m.     GEN: Well nourished, well developed, in no acute distress. HEENT: normal. Neck: Supple, no JVD, carotid bruits, or masses. Cardiac: RRR, distant, no murmurs, rubs, or gallops. No clubbing, cyanosis, 1+ bilateral lower extremity woody edema.  Radials 2+/PT 1+ and equal bilaterally.  Respiratory:  Respirations regular and unlabored, clear to auscultation bilaterally. GI: Obese, soft, nontender, nondistended, BS + x 4. MS: no deformity or atrophy. Skin: warm and dry, no rash. Neuro:  Strength and sensation are intact. Psych: Normal affect.  Accessory Clinical Findings    ECG personally reviewed by me today -regular sinus rhythm, 72, PACs- no acute changes.  Labs dated July 13, 2022 from care everywhere  Hemoglobin 10.3, hematocrit 31.0, WBC 3.9, platelets 127 Sodium 140, potassium 4.2, chloride 104, CO2 28, BUN 30, creatinine 2.7, glucose 64 Total  bilirubin 0.7, alkaline phosphatase 54, AST 27, ALT 48 Calcium 9.5, albumin 3.9, total protein 6.6  Assessment & Plan    1.  Atrial flutter with prior question of paroxysmal atrial fibrillation: Status  post catheter ablation for atrial flutter in 2018.  During hospitalization in June 2023, there was a question of atrial fibrillation but our team evaluated and felt this was more consistent with sinus rhythm and PACs.  Outpatient monitoring was advised.  There was initial attempt to get a monitor in August however, his wife accidentally threw it out.  We tried again in December however this time, he was unable to place it himself and so he brought the monitor with him today.  He is in sinus rhythm with PACs today.  He is otherwise been feeling well without chest pain, dyspnea, or palpitations.  Our nursing staff was able to place a ZIO monitor today and he will wear for the next 2 weeks.  2. Chronic HFpEF: Echo in August 2023 showed an EF of 60 to 65% with grade 1 diastolic dysfunction.  Normal RV function also noted.  Patient has some degree of chronic, stable dyspnea on exertion as well as chronic 1+ bilateral lower extremity woody edema.  No significant JVD on exam and his lungs are clear.  His weight is actually down compared to his last visit.  Heart rate and blood pressure well-controlled.  He remains on beta-blocker, ARB, amlodipine, and nitrate therapy.  He is using Lasix 40 mg twice daily and metolazone once a week.  He is followed closely by Dr. Rolly Salter re: renal dysfxn.  3.  Essential HTN: Stable on current regimen.  4.  Hyperlipidemia: Has not had lipids in several years.  LDL was 38 in August 2021.  LFTs normal in December.  He remains on rosuvastatin therapy.  He is not fasting today and perhaps we can arrange follow-up lipids at his next visit.  5.  Stage III chronic kidney disease: Followed by nephrology.  Creatinine was 2.7 at most recent evaluation.  He is on ARB therapy.  6.  Type 2  diabetes mellitus: On metformin and followed by primary care.  A1c was 6.2 in December.   Murray Hodgkins, NP 08/20/2022, 3:19 PM

## 2022-08-20 NOTE — Patient Instructions (Addendum)
Medication Instructions:  - Your physician recommends that you continue on your current medications as directed. Please refer to the Current Medication list given to you today.  *If you need a refill on your cardiac medications before your next appointment, please call your pharmacy*   Lab Work: - none ordered  If you have labs (blood work) drawn today and your tests are completely normal, you will receive your results only by: [place] (if you have MyChart) OR A paper copy in the mail If you have any lab test that is abnormal or we need to change your treatment, we will call you to review the results.   Testing/Procedures: - Assisted placing previously ordered monitor in office today  ZIO AT ID # [id number]   Follow-Up: At [name], you and your health needs are our priority.  As part of our continuing mission to provide you with exceptional heart care, we have created designated Provider Care Teams.  These Care Teams include your primary Cardiologist (physician) and Advanced Practice Providers (APPs -  Physician Assistants and Nurse Practitioners) who all work together to provide you with the care you need, when you need it.  We recommend signing up for the patient portal called "MyChart".  Sign up information is provided on this After Visit Summary.  MyChart is used to connect with patients for Virtual Visits (Telemedicine).  Patients are able to view lab/test results, encounter notes, upcoming appointments, etc.  Non-urgent messages can be sent to your provider as well.   To learn more about what you can do with MyChart, go to [email or web address].    Your next appointment:   3-4 month(s)  Provider:   You may see Ida Rogue, MD or one of the following Advanced Practice Providers on your designated Care Team:   Murray Hodgkins, NP     Other Instructions N/a

## 2022-08-23 DIAGNOSIS — I498 Other specified cardiac arrhythmias: Secondary | ICD-10-CM | POA: Diagnosis not present

## 2022-08-23 DIAGNOSIS — I499 Cardiac arrhythmia, unspecified: Secondary | ICD-10-CM | POA: Diagnosis not present

## 2022-08-25 ENCOUNTER — Other Ambulatory Visit: Payer: Self-pay | Admitting: Family

## 2022-08-25 ENCOUNTER — Other Ambulatory Visit: Payer: Self-pay

## 2022-08-25 ENCOUNTER — Emergency Department
Admission: EM | Admit: 2022-08-25 | Discharge: 2022-08-25 | Disposition: A | Discharge status: Home / Self Care | Payer: Medicare Other | Attending: Emergency Medicine | Admitting: Emergency Medicine

## 2022-08-25 ENCOUNTER — Emergency Department: Payer: Medicare Other

## 2022-08-25 DIAGNOSIS — M25562 Pain in left knee: Secondary | ICD-10-CM | POA: Diagnosis not present

## 2022-08-25 DIAGNOSIS — S8002XA Contusion of left knee, initial encounter: Secondary | ICD-10-CM

## 2022-08-25 DIAGNOSIS — E162 Hypoglycemia, unspecified: Secondary | ICD-10-CM | POA: Diagnosis not present

## 2022-08-25 DIAGNOSIS — Z743 Need for continuous supervision: Secondary | ICD-10-CM | POA: Diagnosis not present

## 2022-08-25 DIAGNOSIS — S8992XA Unspecified injury of left lower leg, initial encounter: Secondary | ICD-10-CM | POA: Diagnosis present

## 2022-08-25 DIAGNOSIS — R6889 Other general symptoms and signs: Secondary | ICD-10-CM | POA: Diagnosis not present

## 2022-08-25 DIAGNOSIS — Y92410 Unspecified street and highway as the place of occurrence of the external cause: Secondary | ICD-10-CM | POA: Insufficient documentation

## 2022-08-25 DIAGNOSIS — E161 Other hypoglycemia: Secondary | ICD-10-CM | POA: Diagnosis not present

## 2022-08-25 MED ORDER — HYDROCODONE-ACETAMINOPHEN 5-325 MG PO TABS
1.0000 | ORAL_TABLET | Freq: Once | ORAL | Status: AC
  Administered 2022-08-25: 1 via ORAL
  Filled 2022-08-25: qty 1

## 2022-08-25 MED ORDER — METHOCARBAMOL 500 MG PO TABS
500.0000 mg | ORAL_TABLET | Freq: Once | ORAL | Status: AC
  Administered 2022-08-25: 500 mg via ORAL
  Filled 2022-08-25 (×2): qty 1

## 2022-08-25 MED ORDER — METHOCARBAMOL 500 MG PO TABS: 500.0000 mg | ORAL_TABLET | Freq: Four times a day (QID) | ORAL | 0 refills | Status: DC

## 2022-08-25 MED ORDER — ONDANSETRON 4 MG PO TBDP
8.0000 mg | ORAL_TABLET | Freq: Once | ORAL | Status: AC
  Administered 2022-08-25: 8 mg via ORAL
  Filled 2022-08-25: qty 2

## 2022-08-25 MED ORDER — PREDNISONE 50 MG PO TABS: 50.0000 mg | ORAL_TABLET | Freq: Every day | ORAL | 0 refills | Status: DC

## 2022-08-25 MED ORDER — HYDROCODONE-ACETAMINOPHEN 5-325 MG PO TABS: 1.0000 | ORAL_TABLET | ORAL | 0 refills | Status: DC | PRN

## 2022-08-25 MED ORDER — ONDANSETRON 4 MG PO TBDP: 4.0000 mg | ORAL_TABLET | Freq: Three times a day (TID) | ORAL | 0 refills | Status: DC | PRN
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
## 2022-08-25 NOTE — ED Notes (Signed)
This nurse was told by Registration that patient family was asking for my name to complain. When I spoke to the patient and family, they denied such. When I asked if there was anything else I could be doing to care for them better, they stated that I was doing a great job. Family member is concerned that there is no call bell in the room. Again, patient reiterated that I needed to keep up the good work.
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
## 2022-08-30 ENCOUNTER — Telehealth: Payer: Self-pay | Admitting: Internal Medicine

## 2022-08-30 NOTE — Telephone Encounter (Signed)
Prescription Request  08/30/2022  Is this a "Controlled Substance" medicine? Yes  LOV: 07/15/2022  What is the name of the medication or equipment? HYDROcodone-acetaminophen (NORCO/VICODIN) 5-325 MG tablet   Have you contacted your pharmacy to request a refill? Yes   Which pharmacy would you like this sent to?  [facility] - [place], [place] - [address] [address] Phone: [phone] Fax: [phone]  Patient notified that their request is being sent to the clinical staff for review and that they should receive a response within 2 business days.   Please advise at Mobile [phone] (mobile)

## 2022-08-30 NOTE — Telephone Encounter (Signed)
Called and left a message that the rx was sent to the pharmacy 08-26-22. Please check with the pharmacy.

## 2022-09-05 ENCOUNTER — Other Ambulatory Visit: Payer: Self-pay

## 2022-09-05 ENCOUNTER — Inpatient Hospital Stay
Admission: EM | Admit: 2022-09-05 | Discharge: 2022-09-11 | DRG: 871 | Disposition: A | Discharge status: Skilled Nursing Facility | Payer: Medicare Other | Attending: Internal Medicine | Admitting: Internal Medicine

## 2022-09-05 ENCOUNTER — Emergency Department: Payer: Medicare Other

## 2022-09-05 ENCOUNTER — Inpatient Hospital Stay: Payer: Medicare Other

## 2022-09-05 DIAGNOSIS — Z6838 Body mass index (BMI) 38.0-38.9, adult: Secondary | ICD-10-CM

## 2022-09-05 DIAGNOSIS — N179 Acute kidney failure, unspecified: Secondary | ICD-10-CM | POA: Diagnosis not present

## 2022-09-05 DIAGNOSIS — Z885 Allergy status to narcotic agent status: Secondary | ICD-10-CM | POA: Diagnosis not present

## 2022-09-05 DIAGNOSIS — R6520 Severe sepsis without septic shock: Secondary | ICD-10-CM | POA: Diagnosis not present

## 2022-09-05 DIAGNOSIS — R262 Difficulty in walking, not elsewhere classified: Secondary | ICD-10-CM | POA: Diagnosis not present

## 2022-09-05 DIAGNOSIS — R279 Unspecified lack of coordination: Secondary | ICD-10-CM | POA: Diagnosis not present

## 2022-09-05 DIAGNOSIS — Z8616 Personal history of COVID-19: Secondary | ICD-10-CM | POA: Diagnosis not present

## 2022-09-05 DIAGNOSIS — Z87891 Personal history of nicotine dependence: Secondary | ICD-10-CM | POA: Diagnosis not present

## 2022-09-05 DIAGNOSIS — Z1152 Encounter for screening for COVID-19: Secondary | ICD-10-CM | POA: Diagnosis not present

## 2022-09-05 DIAGNOSIS — L03116 Cellulitis of left lower limb: Secondary | ICD-10-CM | POA: Diagnosis not present

## 2022-09-05 DIAGNOSIS — E0840 Diabetes mellitus due to underlying condition with diabetic neuropathy, unspecified: Secondary | ICD-10-CM | POA: Diagnosis not present

## 2022-09-05 DIAGNOSIS — G4733 Obstructive sleep apnea (adult) (pediatric): Secondary | ICD-10-CM | POA: Diagnosis present

## 2022-09-05 DIAGNOSIS — R410 Disorientation, unspecified: Secondary | ICD-10-CM | POA: Diagnosis not present

## 2022-09-05 DIAGNOSIS — I4892 Unspecified atrial flutter: Secondary | ICD-10-CM | POA: Diagnosis not present

## 2022-09-05 DIAGNOSIS — Z79899 Other long term (current) drug therapy: Secondary | ICD-10-CM

## 2022-09-05 DIAGNOSIS — N189 Chronic kidney disease, unspecified: Secondary | ICD-10-CM

## 2022-09-05 DIAGNOSIS — Z8572 Personal history of non-Hodgkin lymphomas: Secondary | ICD-10-CM

## 2022-09-05 DIAGNOSIS — E785 Hyperlipidemia, unspecified: Secondary | ICD-10-CM | POA: Diagnosis not present

## 2022-09-05 DIAGNOSIS — E871 Hypo-osmolality and hyponatremia: Secondary | ICD-10-CM | POA: Diagnosis not present

## 2022-09-05 DIAGNOSIS — Z91030 Bee allergy status: Secondary | ICD-10-CM | POA: Diagnosis not present

## 2022-09-05 DIAGNOSIS — F419 Anxiety disorder, unspecified: Secondary | ICD-10-CM | POA: Diagnosis present

## 2022-09-05 DIAGNOSIS — R239 Unspecified skin changes: Secondary | ICD-10-CM | POA: Diagnosis not present

## 2022-09-05 DIAGNOSIS — R600 Localized edema: Secondary | ICD-10-CM

## 2022-09-05 DIAGNOSIS — N1832 Chronic kidney disease, stage 3b: Secondary | ICD-10-CM | POA: Diagnosis not present

## 2022-09-05 DIAGNOSIS — M05611 Rheumatoid arthritis of right shoulder with involvement of other organs and systems: Secondary | ICD-10-CM | POA: Diagnosis not present

## 2022-09-05 DIAGNOSIS — Z96643 Presence of artificial hip joint, bilateral: Secondary | ICD-10-CM | POA: Diagnosis present

## 2022-09-05 DIAGNOSIS — G9341 Metabolic encephalopathy: Secondary | ICD-10-CM | POA: Diagnosis present

## 2022-09-05 DIAGNOSIS — E669 Obesity, unspecified: Secondary | ICD-10-CM | POA: Diagnosis present

## 2022-09-05 DIAGNOSIS — A419 Sepsis, unspecified organism: Principal | ICD-10-CM | POA: Diagnosis present

## 2022-09-05 DIAGNOSIS — I10 Essential (primary) hypertension: Secondary | ICD-10-CM | POA: Diagnosis not present

## 2022-09-05 DIAGNOSIS — E1121 Type 2 diabetes mellitus with diabetic nephropathy: Secondary | ICD-10-CM | POA: Diagnosis not present

## 2022-09-05 DIAGNOSIS — M79605 Pain in left leg: Secondary | ICD-10-CM | POA: Diagnosis not present

## 2022-09-05 DIAGNOSIS — E611 Iron deficiency: Secondary | ICD-10-CM | POA: Diagnosis not present

## 2022-09-05 DIAGNOSIS — I129 Hypertensive chronic kidney disease with stage 1 through stage 4 chronic kidney disease, or unspecified chronic kidney disease: Secondary | ICD-10-CM | POA: Diagnosis not present

## 2022-09-05 DIAGNOSIS — E1122 Type 2 diabetes mellitus with diabetic chronic kidney disease: Secondary | ICD-10-CM

## 2022-09-05 DIAGNOSIS — R6889 Other general symptoms and signs: Secondary | ICD-10-CM | POA: Diagnosis not present

## 2022-09-05 DIAGNOSIS — N2889 Other specified disorders of kidney and ureter: Secondary | ICD-10-CM | POA: Diagnosis not present

## 2022-09-05 DIAGNOSIS — R32 Unspecified urinary incontinence: Secondary | ICD-10-CM | POA: Diagnosis present

## 2022-09-05 DIAGNOSIS — Z888 Allergy status to other drugs, medicaments and biological substances status: Secondary | ICD-10-CM

## 2022-09-05 DIAGNOSIS — K5730 Diverticulosis of large intestine without perforation or abscess without bleeding: Secondary | ICD-10-CM | POA: Diagnosis not present

## 2022-09-05 DIAGNOSIS — Z7984 Long term (current) use of oral hypoglycemic drugs: Secondary | ICD-10-CM

## 2022-09-05 DIAGNOSIS — Z85828 Personal history of other malignant neoplasm of skin: Secondary | ICD-10-CM | POA: Diagnosis not present

## 2022-09-05 DIAGNOSIS — Z96651 Presence of right artificial knee joint: Secondary | ICD-10-CM | POA: Diagnosis present

## 2022-09-05 DIAGNOSIS — I517 Cardiomegaly: Secondary | ICD-10-CM | POA: Diagnosis not present

## 2022-09-05 DIAGNOSIS — R21 Rash and other nonspecific skin eruption: Secondary | ICD-10-CM | POA: Diagnosis not present

## 2022-09-05 DIAGNOSIS — R509 Fever, unspecified: Secondary | ICD-10-CM | POA: Diagnosis not present

## 2022-09-05 DIAGNOSIS — Z741 Need for assistance with personal care: Secondary | ICD-10-CM | POA: Diagnosis not present

## 2022-09-05 DIAGNOSIS — M6259 Muscle wasting and atrophy, not elsewhere classified, multiple sites: Secondary | ICD-10-CM | POA: Diagnosis not present

## 2022-09-05 DIAGNOSIS — R531 Weakness: Secondary | ICD-10-CM | POA: Diagnosis not present

## 2022-09-05 DIAGNOSIS — M79604 Pain in right leg: Secondary | ICD-10-CM | POA: Diagnosis not present

## 2022-09-05 LAB — CBC WITH DIFFERENTIAL/PLATELET
Abs Immature Granulocytes: 0.56 10*3/uL — ABNORMAL HIGH (ref 0.00–0.07)
Basophils Absolute: 0.1 10*3/uL (ref 0.0–0.1)
Basophils Relative: 1 %
Eosinophils Absolute: 0 10*3/uL (ref 0.0–0.5)
Eosinophils Relative: 0 %
HCT: 30.1 % — ABNORMAL LOW (ref 39.0–52.0)
Hemoglobin: 9.7 g/dL — ABNORMAL LOW (ref 13.0–17.0)
Immature Granulocytes: 3 %
Lymphocytes Relative: 9 %
Lymphs Abs: 1.9 10*3/uL (ref 0.7–4.0)
MCH: 31.5 pg (ref 26.0–34.0)
MCHC: 32.2 g/dL (ref 30.0–36.0)
MCV: 97.7 fL (ref 80.0–100.0)
Monocytes Absolute: 2.4 10*3/uL — ABNORMAL HIGH (ref 0.1–1.0)
Monocytes Relative: 11 %
Neutro Abs: 16.6 10*3/uL — ABNORMAL HIGH (ref 1.7–7.7)
Neutrophils Relative %: 76 %
Platelets: 354 10*3/uL (ref 150–400)
RBC: 3.08 MIL/uL — ABNORMAL LOW (ref 4.22–5.81)
RDW: 17.7 % — ABNORMAL HIGH (ref 11.5–15.5)
WBC: 21.6 10*3/uL — ABNORMAL HIGH (ref 4.0–10.5)
nRBC: 0.4 % — ABNORMAL HIGH (ref 0.0–0.2)

## 2022-09-05 LAB — URINALYSIS, W/ REFLEX TO CULTURE (INFECTION SUSPECTED)
Bilirubin Urine: NEGATIVE
Glucose, UA: 50 mg/dL — AB
Ketones, ur: NEGATIVE mg/dL
Leukocytes,Ua: NEGATIVE
Nitrite: NEGATIVE
Protein, ur: 100 mg/dL — AB
Specific Gravity, Urine: 1.013 (ref 1.005–1.030)
Squamous Epithelial / HPF: NONE SEEN /HPF (ref 0–5)
pH: 7 (ref 5.0–8.0)

## 2022-09-05 LAB — COMPREHENSIVE METABOLIC PANEL
ALT: 17 U/L (ref 0–44)
AST: 19 U/L (ref 15–41)
Albumin: 3.5 g/dL (ref 3.5–5.0)
Alkaline Phosphatase: 54 U/L (ref 38–126)
Anion gap: 9 (ref 5–15)
BUN: 31 mg/dL — ABNORMAL HIGH (ref 8–23)
CO2: 25 mmol/L (ref 22–32)
Calcium: 8.8 mg/dL — ABNORMAL LOW (ref 8.9–10.3)
Chloride: 100 mmol/L (ref 98–111)
Creatinine, Ser: 2.43 mg/dL — ABNORMAL HIGH (ref 0.61–1.24)
GFR, Estimated: 27 mL/min — ABNORMAL LOW (ref >60)
Glucose, Bld: 120 mg/dL — ABNORMAL HIGH (ref 70–99)
Potassium: 3.9 mmol/L (ref 3.5–5.1)
Sodium: 134 mmol/L — ABNORMAL LOW (ref 135–145)
Total Bilirubin: 1 mg/dL (ref 0.3–1.2)
Total Protein: 7 g/dL (ref 6.5–8.1)

## 2022-09-05 LAB — APTT: aPTT: 36 seconds (ref 24–36)

## 2022-09-05 LAB — PROTIME-INR
INR: 1.2 (ref 0.8–1.2)
Prothrombin Time: 15 seconds (ref 11.4–15.2)

## 2022-09-05 LAB — RESP PANEL BY RT-PCR (RSV, FLU A&B, COVID)  RVPGX2
Influenza A by PCR: NEGATIVE
Influenza B by PCR: NEGATIVE
Resp Syncytial Virus by PCR: NEGATIVE
SARS Coronavirus 2 by RT PCR: NEGATIVE

## 2022-09-05 LAB — LACTIC ACID, PLASMA
Lactic Acid, Venous: 2 mmol/L (ref 0.5–1.9)
Lactic Acid, Venous: 0.9 mmol/L (ref 0.5–1.9)

## 2022-09-05 LAB — CK: Total CK: 32 U/L — ABNORMAL LOW (ref 49–397)

## 2022-09-05 LAB — GLUCOSE, CAPILLARY: Glucose-Capillary: 135 mg/dL — ABNORMAL HIGH (ref 70–99)

## 2022-09-05 MED ORDER — ROSUVASTATIN CALCIUM 10 MG PO TABS
20.0000 mg | ORAL_TABLET | Freq: Every day | ORAL | Status: DC
  Administered 2022-09-05 – 2022-09-11 (×7): 20 mg via ORAL
  Filled 2022-09-05 (×5): qty 2
  Filled 2022-09-05: qty 1
  Filled 2022-09-05 (×2): qty 2

## 2022-09-05 MED ORDER — ACETAMINOPHEN 325 MG PO TABS
650.0000 mg | ORAL_TABLET | Freq: Four times a day (QID) | ORAL | Status: DC | PRN
  Administered 2022-09-08 – 2022-09-10 (×4): 650 mg via ORAL
  Filled 2022-09-05 (×4): qty 2

## 2022-09-05 MED ORDER — CLORAZEPATE DIPOTASSIUM 7.5 MG PO TABS: 7.5000 mg | ORAL_TABLET | Freq: Two times a day (BID) | ORAL | Status: DC | PRN

## 2022-09-05 MED ORDER — SODIUM CHLORIDE 0.9 % IV SOLN: 2.0000 g | INTRAVENOUS | Status: DC

## 2022-09-05 MED ORDER — GLIPIZIDE 5 MG PO TABS
2.5000 mg | ORAL_TABLET | Freq: Two times a day (BID) | ORAL | Status: DC
  Administered 2022-09-06 – 2022-09-11 (×10): 2.5 mg via ORAL
  Filled 2022-09-05 (×12): qty 0.5

## 2022-09-05 MED ORDER — FERROUS SULFATE 325 (65 FE) MG PO TABS
325.0000 mg | ORAL_TABLET | Freq: Every day | ORAL | Status: DC
  Administered 2022-09-05 – 2022-09-10 (×6): 325 mg via ORAL
  Filled 2022-09-05 (×6): qty 1

## 2022-09-05 MED ORDER — DICLOFENAC SODIUM 1 % EX GEL: 2.0000 g | Freq: Four times a day (QID) | CUTANEOUS | Status: DC

## 2022-09-05 MED ORDER — SODIUM CHLORIDE 0.9 % IV BOLUS (SEPSIS)
1000.0000 mL | Freq: Once | INTRAVENOUS | Status: AC
  Administered 2022-09-05: 1000 mL via INTRAVENOUS

## 2022-09-05 MED ORDER — SODIUM CHLORIDE 0.9 % IV SOLN
2.0000 g | Freq: Once | INTRAVENOUS | Status: AC
  Administered 2022-09-05: 2 g via INTRAVENOUS
  Filled 2022-09-05: qty 12.5

## 2022-09-05 MED ORDER — ACETAMINOPHEN 650 MG RE SUPP: 650.0000 mg | Freq: Four times a day (QID) | RECTAL | Status: DC | PRN

## 2022-09-05 MED ORDER — METRONIDAZOLE 500 MG/100ML IV SOLN
500.0000 mg | Freq: Two times a day (BID) | INTRAVENOUS | Status: DC
  Administered 2022-09-06 – 2022-09-07 (×3): 500 mg via INTRAVENOUS
  Filled 2022-09-05 (×4): qty 100

## 2022-09-05 MED ORDER — AMLODIPINE BESYLATE 5 MG PO TABS
5.0000 mg | ORAL_TABLET | Freq: Every day | ORAL | Status: DC
  Administered 2022-09-06 – 2022-09-11 (×6): 5 mg via ORAL
  Filled 2022-09-05 (×6): qty 1

## 2022-09-05 MED ORDER — ADULT MULTIVITAMIN W/MINERALS CH
1.0000 | ORAL_TABLET | Freq: Every day | ORAL | Status: DC
  Administered 2022-09-06 – 2022-09-11 (×6): 1 via ORAL
  Filled 2022-09-05 (×6): qty 1

## 2022-09-05 MED ORDER — VANCOMYCIN HCL 1250 MG/250ML IV SOLN: 1250.0000 mg | INTRAVENOUS | Status: DC

## 2022-09-05 MED ORDER — LACTATED RINGERS IV SOLN: INTRAVENOUS | Status: DC

## 2022-09-05 MED ORDER — HYDROCODONE-ACETAMINOPHEN 5-325 MG PO TABS
1.0000 | ORAL_TABLET | Freq: Four times a day (QID) | ORAL | Status: DC | PRN
  Administered 2022-09-06 – 2022-09-07 (×2): 1 via ORAL
  Filled 2022-09-05 (×2): qty 1

## 2022-09-05 MED ORDER — ISOSORBIDE MONONITRATE ER 30 MG PO TB24
60.0000 mg | ORAL_TABLET | Freq: Every day | ORAL | Status: DC
  Administered 2022-09-06 – 2022-09-11 (×6): 60 mg via ORAL
  Filled 2022-09-05 (×6): qty 2

## 2022-09-05 MED ORDER — SODIUM CHLORIDE 0.9 % IV SOLN: INTRAVENOUS | Status: DC

## 2022-09-05 MED ORDER — ONDANSETRON HCL 4 MG PO TABS: 4.0000 mg | ORAL_TABLET | Freq: Four times a day (QID) | ORAL | Status: DC | PRN

## 2022-09-05 MED ORDER — SODIUM CHLORIDE 0.9 % IV SOLN: 2.0000 g | Freq: Once | INTRAVENOUS | Status: DC

## 2022-09-05 MED ORDER — METRONIDAZOLE 500 MG/100ML IV SOLN
500.0000 mg | Freq: Once | INTRAVENOUS | Status: AC
  Administered 2022-09-05: 500 mg via INTRAVENOUS
  Filled 2022-09-05: qty 100

## 2022-09-05 MED ORDER — LIRAGLUTIDE 18 MG/3ML ~~LOC~~ SOPN: 1.2000 mg | PEN_INJECTOR | Freq: Every day | SUBCUTANEOUS | Status: DC

## 2022-09-05 MED ORDER — FOLIC ACID 1 MG PO TABS
1.0000 mg | ORAL_TABLET | Freq: Every day | ORAL | Status: DC
  Administered 2022-09-06 – 2022-09-11 (×6): 1 mg via ORAL
  Filled 2022-09-05 (×6): qty 1

## 2022-09-05 MED ORDER — ONDANSETRON HCL 4 MG/2ML IJ SOLN
4.0000 mg | Freq: Four times a day (QID) | INTRAMUSCULAR | Status: DC | PRN
  Administered 2022-09-10: 4 mg via INTRAVENOUS
  Filled 2022-09-05: qty 2

## 2022-09-05 MED ORDER — VANCOMYCIN HCL IN DEXTROSE 1-5 GM/200ML-% IV SOLN
1000.0000 mg | Freq: Once | INTRAVENOUS | Status: AC
  Administered 2022-09-05: 1000 mg via INTRAVENOUS
  Filled 2022-09-05: qty 200

## 2022-09-05 MED ORDER — EPINEPHRINE 0.3 MG/0.3ML IJ SOAJ: 0.3000 mg | INTRAMUSCULAR | Status: DC | PRN

## 2022-09-05 MED ORDER — METHOTREXATE 2.5 MG PO TABS: 10.0000 mg | ORAL_TABLET | ORAL | Status: DC

## 2022-09-05 MED ORDER — VITAMIN D3 25 MCG (1000 UNIT) PO TABS
10000.0000 [IU] | ORAL_TABLET | Freq: Every day | ORAL | Status: DC
  Administered 2022-09-06 – 2022-09-09 (×4): 10000 [IU] via ORAL
  Filled 2022-09-05 (×9): qty 10

## 2022-09-05 MED ORDER — ONDANSETRON 4 MG PO TBDP: 4.0000 mg | ORAL_TABLET | Freq: Three times a day (TID) | ORAL | Status: DC | PRN

## 2022-09-05 MED ORDER — CARVEDILOL 6.25 MG PO TABS
3.1250 mg | ORAL_TABLET | Freq: Two times a day (BID) | ORAL | Status: DC
  Administered 2022-09-06 – 2022-09-11 (×9): 3.125 mg via ORAL
  Filled 2022-09-05 (×10): qty 1

## 2022-09-05 MED ORDER — KETOCONAZOLE 2 % EX CREA: 1.0000 | TOPICAL_CREAM | Freq: Every day | CUTANEOUS | Status: DC | PRN

## 2022-09-05 MED ORDER — ENOXAPARIN SODIUM 30 MG/0.3ML IJ SOSY
30.0000 mg | PREFILLED_SYRINGE | INTRAMUSCULAR | Status: DC
  Administered 2022-09-05: 30 mg via SUBCUTANEOUS
  Filled 2022-09-05: qty 0.3

## 2022-09-05 MED ORDER — METRONIDAZOLE 500 MG/100ML IV SOLN
500.0000 mg | Freq: Two times a day (BID) | INTRAVENOUS | Status: DC
  Filled 2022-09-05: qty 100

## 2022-09-05 MED ORDER — TRAZODONE HCL 50 MG PO TABS
25.0000 mg | ORAL_TABLET | Freq: Every evening | ORAL | Status: DC | PRN
  Administered 2022-09-06 – 2022-09-10 (×4): 25 mg via ORAL
  Filled 2022-09-05 (×4): qty 1

## 2022-09-05 MED ORDER — MUPIROCIN 2 % EX OINT
1.0000 | TOPICAL_OINTMENT | Freq: Every day | CUTANEOUS | Status: DC
  Administered 2022-09-06 – 2022-09-11 (×6): 1 via TOPICAL
  Filled 2022-09-05: qty 22

## 2022-09-05 MED ORDER — CLOBETASOL PROPIONATE 0.05 % EX CREA: 1.0000 | TOPICAL_CREAM | Freq: Two times a day (BID) | CUTANEOUS | Status: DC | PRN

## 2022-09-05 MED ORDER — TIMOLOL MALEATE 0.5 % OP SOLN
1.0000 [drp] | Freq: Two times a day (BID) | OPHTHALMIC | Status: DC
  Administered 2022-09-05 – 2022-09-11 (×11): 1 [drp] via OPHTHALMIC
  Filled 2022-09-05: qty 5

## 2022-09-05 MED ORDER — HYDROXYZINE HCL 25 MG PO TABS
25.0000 mg | ORAL_TABLET | Freq: Three times a day (TID) | ORAL | Status: DC | PRN
  Administered 2022-09-06: 25 mg via ORAL
  Filled 2022-09-05: qty 1

## 2022-09-05 MED ORDER — VANCOMYCIN HCL 1500 MG/300ML IV SOLN
1500.0000 mg | Freq: Once | INTRAVENOUS | Status: AC
  Administered 2022-09-05: 1500 mg via INTRAVENOUS
  Filled 2022-09-05: qty 300

## 2022-09-05 NOTE — Consult Note (Signed)
Pharmacy Antibiotic Note  Daniel Kline is a 77 y.o. male admitted on 09/05/2022 with intra-abdominal infection.  Pharmacy has been consulted for cefepime dosing.  1/28 @ 1818 Cefepime 2 grams IV x 1 given  Plan: Start Cefepime 2 grams IV every 24 hours  eCrCl < 30 ml/min Follow renal function for adjustments Provider has also ordered Flagyl 500 mg IV every 12 hours  Height: '5\' 6"'$  (167.6 cm) Weight: 108.8 kg (239 lb 12.8 oz) IBW/kg (Calculated) : 63.8  Temp (24hrs), Avg:99.6 F (37.6 C), Min:98.7 F (37.1 C), Max:100.4 F (38 C)  Recent Labs  Lab 09/05/22 1757  WBC 21.6*  CREATININE 2.43*  LATICACIDVEN 2.0*    Estimated Creatinine Clearance: 29.5 mL/min (A) (by C-G formula based on SCr of 2.43 mg/dL (H)).    Allergies  Allergen Reactions   Bee Venom Anaphylaxis   Oxycodone Other (See Comments)    Delusions   Hydromorphone Other (See Comments)    hallucinating   Hydroxychloroquine     Other reaction(s): Other (See Comments) He broke out really badly.   Zolpidem Other (See Comments)    Antimicrobials this admission: Cefepime 1/28 >>  Flagyl 1/28 >>  Vancomycin x 1 in ED  Dose adjustments this admission: N/A  Microbiology results: 1/28 BCx: pending  Thank you for allowing pharmacy to be a part of this patient's care.  Lorin Picket, PharmD 09/05/2022 9:12 PM

## 2022-09-05 NOTE — ED Notes (Signed)
ED Provider at bedside. 

## 2022-09-05 NOTE — Assessment & Plan Note (Signed)
-  The patient will be hydrated with IV normal saline. - We will follow BMP. - We will avoid nephrotoxins.

## 2022-09-05 NOTE — Assessment & Plan Note (Signed)
-  The patient will be placed on supplemental coverage with NovoLog.

## 2022-09-05 NOTE — Progress Notes (Signed)
[facility] following for sepsis protocol. 

## 2022-09-05 NOTE — ED Notes (Signed)
Patient transported to CT 

## 2022-09-05 NOTE — H&P (Addendum)
Daniel Kline   PATIENT NAME: Daniel Kline    MR#:  [id number]  DATE OF BIRTH:  Feb 23, 1946  DATE OF ADMISSION:  09/05/2022  PRIMARY CARE PHYSICIAN: [name], MD   Patient is coming from: Home  REQUESTING/REFERRING PHYSICIAN: Harvest Dark, MD  CHIEF COMPLAINT:   Chief Complaint  Patient presents with   Altered Mental Status    Patient from home - EMS called because patient has been confused (A&O x 4 upon arrival here) and weak; EMS states that his house smelled strongly of urine; Patient is incontinent of urine upon arrival and appears unkept    HISTORY OF PRESENT ILLNESS:  Daniel Kline is a 77 y.o. Caucasian male with medical history significant for anxiety, osteoarthritis, atrial flutter, chronic kidney disease,  polymyositis with right arm deficits, type 2 diabetes mellitus, diastolic dysfunction, hypertension, dyslipidemia and OSA, who presented to the emergency room with acute onset of generalized weakness and altered mental status with confusion.  Upon arrival of EMS her temperature was 101.1.  He denies any chest pain or abdominal pain.  The patient was covered with urine.  No nausea or vomiting or diarrhea.  EMS noticed very strong urine smell at her residence.  He admitted to bilateral lower extremity swelling with mild erythema and tenderness of the right leg and ankle.  He has been having some nasal congestion without headache or dizziness or blurred vision.  No chest pain or palpitations.  He did not hide  any abdominal pain.  No earache or sore throat.  No dysuria, oliguria, hematuria, urgency or frequency or flank pain.  ED Course: Upon presentation to the emergency room, BP was 155/90 and later 139/75 with otherwise normal vital signs.  Labs revealed hyponatremia of 134 with a BUN of 31 and creatinine 2.43 compared to 16/1.58 on 02/03/2022.  Lactic acid was 2.  CBC showed leukocytosis 21.6 with neutrophilia.  Imaging: - Portable chest x-ray was  within normal.  Noncontrasted CT scan revealed chronic changes without acute abnormality. - Abdominal pelvic CT scan revealed the following: Nonspecific stranding about both kidneys is favored to be a normal finding though pyelonephritis is not excluded. Otherwise no evidence of infection in the abdomen or pelvis.   Nodular contour of the liver suggestive of cirrhosis.   Mild wall thickening of the gallbladder likely due to decompression and cirrhosis.   Colonic diverticulosis without diverticulitis.  The patient was given IV cefepime vancomycin and Flagyl, 1 L bolus of IV normal saline followed by IV lactated Ringer at 150 mill per hour.  He will be admitted to a medical telemetry bed for further evaluation and management. PAST MEDICAL HISTORY:   Past Medical History:  Diagnosis Date   Anemia    H/O   Anxiety    Arthritis    Atrial flutter ([place])    a. s/p post ablation in 04/2017   Chronic kidney disease    Complication of anesthesia    CTCL (cutaneous T-cell lymphoma) (HCC)    Diabetes mellitus without complication (HCC)    Diastolic dysfunction    a. 03/2022 Echo: EF 60-65%, no rwma, GrI DD, nl RV fxn.   Dysplastic nevus 12/19/2017   Right distal lat. forearm near wrist. Severe atypia, close to peripheral margin.   Dysplastic nevus 06/21/2018   Upper back right paraspinal. Severe atypia, peripheral margin involved. Excised 07/11/2018, margins free.   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 forearm.  SCCis, hypertrophic.    PAST SURGICAL HISTORY:   Past Surgical History:  Procedure Laterality Date   BACK SURGERY     LUMBAR   CARDIAC ELECTROPHYSIOLOGY STUDY AND ABLATION  2019   CATARACT EXTRACTION W/PHACO Right 05/05/2022   Procedure: CATARACT EXTRACTION PHACO AND INTRAOCULAR LENS PLACEMENT ([place]) RIGHT;  Surgeon: Leandrew Koyanagi, MD;  Location: [facility];  Service: Ophthalmology;  Laterality: Right;  Diabetic 10.42 01:13.4   COLONOSCOPY WITH PROPOFOL N/A 04/04/2019   Procedure: COLONOSCOPY WITH PROPOFOL;  Surgeon: Toledo, Benay Pike, MD;  Location: [facility];  Service: Gastroenterology;  Laterality: N/A;   I & D EXTREMITY Right 02/01/2020   Procedure: IRRIGATION AND DEBRIDEMENT EXTREMITY with poly exchange;  Surgeon: Dereck Leep, MD;  Location: [facility];  Service: Orthopedics;  Laterality: Right;   INCISION AND DRAINAGE     BACK-MRSA INFECTION AFTER BACK SURGERY   KNEE ARTHROPLASTY Right 01/28/2020   Procedure: COMPUTER ASSISTED TOTAL KNEE ARTHROPLASTY;  Surgeon: Dereck Leep, MD;  Location: [facility];  Service: Orthopedics;  Laterality: Right;   MOUTH SURGERY     right elbow surgery     right knee surgery     right shoulder surgery     from polio damage   TONSILLECTOMY     TOTAL HIP ARTHROPLASTY Bilateral 04/2016    SOCIAL HISTORY:   Social History   Tobacco Use   Smoking status: Former    Types: Cigars    Quit date: 09/23/1990    Years since quitting: 31.9    Passive exposure: Past (as a child)   Smokeless tobacco: Former    Quit date: 02/25/2006  Substance Use Topics   Alcohol use: No    Alcohol/week: 0.0 standard drinks of alcohol    FAMILY HISTORY:   Family History  Adopted: Yes    DRUG ALLERGIES:   Allergies  Allergen Reactions   Bee Venom Anaphylaxis   Oxycodone Other (See Comments)    Delusions   Hydromorphone Other (See Comments)    hallucinating   Hydroxychloroquine     Other reaction(s): Other (See Comments) He broke  out really badly.   Zolpidem Other (See Comments)    REVIEW OF SYSTEMS:   ROS As per history of present illness. All pertinent systems were reviewed above. Constitutional, HEENT, cardiovascular, respiratory, GI, GU, musculoskeletal, neuro, psychiatric, endocrine, integumentary and hematologic systems were reviewed and are otherwise negative/unremarkable except for positive findings mentioned above in the HPI.   MEDICATIONS AT HOME:   Prior to Admission medications   Medication Sig Start Date End Date Taking? Authorizing Provider  amLODipine (NORVASC) 5 MG tablet TAKE 1 TABLET BY MOUTH DAILY 06/16/22   Viviana Simpler I, MD  carvedilol (COREG) 3.125 MG tablet TAKE 1 TABLET BY MOUTH 2 TIMES DAILY WITH A MEAL. 03/03/22   [name], MD  Cholecalciferol (VITAMIN D-3 PO) Take 10,000 Units by mouth daily.     [provider]  clobetasol cream (  TEMOVATE) 1.22 % Apply 1 application topically 2 (two) times daily as needed (irritation).  06/27/15   [provider]  clorazepate (TRANXENE) 7.5 MG tablet TAKE 1 TABLET BY MOUTH TWICE A DAY AS NEEDED FOR ANXIETY 08/10/22   Viviana Simpler I, MD  diclofenac Sodium (VOLTAREN) 1 % GEL APPLY 2 GRAMS TOPICALLY FOUR TIMES DAILY 11/05/21   [name], MD  Elastic Bandages & Supports (MEDICAL COMPRESSION STOCKINGS) MISC 2 Units by Does not apply route daily. 08/19/17   Merlyn Lot, MD  EPINEPHrine 0.3 mg/0.3 mL IJ SOAJ injection Inject 0.3 mLs (0.3 mg total) into the muscle as needed for anaphylaxis. 11/22/19   [name], MD  Ferrous Sulfate (IRON PO) Take 1 tablet by mouth at bedtime.    [provider]  folic acid (FOLVITE) 1 MG tablet Take 1 mg by mouth daily. Every day except day that he takes methotrexate 06/11/20   [provider]  furosemide (LASIX) 40 MG tablet Take 1 tablet (40 mg total) by mouth 2 (two) times daily. 05/11/21   Minna Merritts, MD  gabapentin (NEURONTIN) 300 MG capsule TAKE 1  CAPSULE BY MOUTH 3 TIMES  DAILY 06/14/22   Viviana Simpler I, MD  glipiZIDE (GLUCOTROL) 5 MG tablet TAKE ONE-HALF TABLET BY  MOUTH TWICE DAILY BEFORE  MEALS 12/02/21   [name], MD  HYDROcodone-acetaminophen (NORCO/VICODIN) 5-325 MG tablet TAKE 1 TABLET BY MOUTH EVERY 6 HOUR AS NEEDED FOR PAIN 08/26/22   [name], MD  hydrOXYzine (ATARAX) 25 MG tablet TAKE ONE TABLET EVERY EIGHT HOURS AS NEEDED 11/25/21   [name], MD  isosorbide mononitrate (IMDUR) 60 MG 24 hr tablet Take 1 tablet (60 mg total) by mouth daily before lunch. 05/11/21   Minna Merritts, MD  ketoconazole (NIZORAL) 2 % cream Apply 1 Application topically daily. Apply nightly to rash under arms as needed for flares 06/24/22 06/13/24  Ralene Bathe, MD  liraglutide (VICTOZA) 18 MG/3ML SOPN Inject 1.2 mg into the skin daily. 12/24/21   [name], MD  losartan (COZAAR) 100 MG tablet TAKE 1 TABLET BY MOUTH DAILY 02/25/22   Minna Merritts, MD  MAGNESIUM PO Take by mouth.    [provider]  metFORMIN (GLUCOPHAGE) 500 MG tablet TAKE 1 TABLET BY MOUTH TWICE DAILY 08/10/22   [name], MD  methocarbamol (ROBAXIN) 500 MG tablet Take 1 tablet (500 mg total) by mouth 4 (four) times daily. 08/25/22   Cuthriell, Charline Bills, PA-C  methotrexate (RHEUMATREX) 2.5 MG tablet Take 10 mg by mouth once a week. 03/09/22   [provider]  metolazone (ZAROXOLYN) 2.5 MG tablet Take 7.5 mg by mouth once a week. Thurs    [provider]  Multiple Vitamins-Minerals (MULTIVITAL) tablet Take 1 tablet by mouth daily.    [provider]  mupirocin ointment (BACTROBAN) 2 % Apply 1 Application topically daily. Qd to crust on leg until healed, or to any open sores 06/24/22   Ralene Bathe, MD  ondansetron (ZOFRAN-ODT) 4 MG disintegrating tablet Take 1 tablet (4 mg total) by mouth every 8 (eight) hours as needed. 08/25/22   Cuthriell, Charline Bills, PA-C  ONETOUCH ULTRA test strip CHECK BLOOD SUGAR 3  TIMES DAILY AS DIRECTED 12/02/20   Crecencio Mc, MD  predniSONE (DELTASONE) 50 MG tablet Take 1 tablet (50 mg total) by mouth daily with breakfast. 08/25/22   Cuthriell, Charline Bills, PA-C  rosuvastatin (CRESTOR) 20 MG tablet TAKE  1 TABLET BY MOUTH  DAILY 02/03/22   Viviana Simpler I, MD  timolol (TIMOPTIC) 0.5 % ophthalmic solution Place 1 drop into both eyes 2 (two) times daily. 01/11/20   [provider]  triamcinolone cream (KENALOG) 0.1 % Apply to aa L axilla and L lower leg QD PRN. 10/06/21   Ralene Bathe, MD      VITAL SIGNS:  Blood pressure (!) 152/86, pulse 90, temperature 98.1 F (36.7 C), temperature source Oral, resp. rate 17, height '5\' 6"'$  (1.676 m), weight 108.8 kg, SpO2 97 %.  PHYSICAL EXAMINATION:  Physical Exam  GENERAL:  77 y.o.-year-old Caucasian male patient lying in the bed with no acute distress.  EYES: Pupils equal, round, reactive to light and accommodation. No scleral icterus. Extraocular muscles intact.  HEENT: Head atraumatic, normocephalic. Oropharynx and nasopharynx clear.  NECK:  Supple, no jugular venous distention. No thyroid enlargement, no tenderness.  LUNGS: Normal breath sounds bilaterally, no wheezing, rales,rhonchi or crepitation. No use of accessory muscles of respiration.  CARDIOVASCULAR: Regular rate and rhythm, S1, S2 normal. No murmurs, rubs, or gallops.  ABDOMEN: Soft, nondistended, nontender. Bowel sounds present. No organomegaly or mass.  EXTREMITIES: Bilateral lower extremity pitting edema, left more than right with no cyanosis, or clubbing.  NEUROLOGIC: Cranial nerves II through XII are intact. Muscle strength 5/5 in all extremities. Sensation intact. Gait not checked.  PSYCHIATRIC: The patient is alert and oriented x 3.  Normal affect and good eye contact. SKIN: Left lower leg at ankle swelling with erythema, warmth and tenderness.  LABORATORY PANEL:   CBC Recent Labs  Lab 09/05/22 1757  WBC 21.6*  HGB 9.7*  HCT 30.1*  PLT  354   ------------------------------------------------------------------------------------------------------------------  Chemistries  Recent Labs  Lab 09/05/22 1757  NA 134*  K 3.9  CL 100  CO2 25  GLUCOSE 120*  BUN 31*  CREATININE 2.43*  CALCIUM 8.8*  AST 19  ALT 17  ALKPHOS 54  BILITOT 1.0   ------------------------------------------------------------------------------------------------------------------  Cardiac Enzymes No results for input(s): "TROPONINI" in the last 168 hours. ------------------------------------------------------------------------------------------------------------------  RADIOLOGY:  CT ABDOMEN PELVIS WO CONTRAST  Result Date: 09/05/2022 CLINICAL DATA:  77 year old male admitted on 09/05/2022 with intra-abdominal infection. Acute nonlocalized abdominal pain. EXAM: CT ABDOMEN AND PELVIS WITHOUT CONTRAST TECHNIQUE: Multidetector CT imaging of the abdomen and pelvis was performed following the standard protocol without IV contrast. RADIATION DOSE REDUCTION: This exam was performed according to the departmental dose-optimization program which includes automated exposure control, adjustment of the mA and/or kV according to patient size and/or use of iterative reconstruction technique. COMPARISON:  None Available. FINDINGS: Lower chest: Respiratory motion obscures the lung bases. No acute abnormality. Hepatobiliary: The hepatic contour demonstrates mild nodularity suggestive of cirrhosis. The gallbladder is decompressed. Mild gallbladder wall thickening may be due to decompression and/or cirrhosis. No biliary dilation. Pancreas: Unremarkable. Spleen: Unremarkable. Adrenals/Urinary Tract: Normal adrenal glands. Bilateral cortical renal scarring. No urinary calculi or hydronephrosis. Nonspecific symmetric stranding about both kidneys. Unremarkable bladder. Stomach/Bowel: Colonic diverticulosis without diverticulitis. Normal appendix. Normal caliber large and small  bowel. Unremarkable stomach. Small hiatal hernia. Vascular/Lymphatic: Aortic atherosclerotic calcification. No enlarged abdominal or pelvic lymph nodes. Reproductive: Unremarkable. Other: Fat containing umbilical hernia. No free intraperitoneal fluid or air. Musculoskeletal: Bilateral total hip replacements. No acute osseous abnormality. Posterior fusion L2-L4. Thoracolumbar spondylosis. IMPRESSION: Nonspecific stranding about both kidneys is favored to be a normal finding though pyelonephritis is not excluded. Otherwise no evidence of infection in the abdomen or pelvis. Nodular contour of the  liver suggestive of cirrhosis. Mild wall thickening of the gallbladder likely due to decompression and cirrhosis. Colonic diverticulosis without diverticulitis. Electronically Signed   By: Placido Sou M.D.   On: 09/05/2022 21:35   CT HEAD WO CONTRAST (5MM)  Result Date: 09/05/2022 CLINICAL DATA:  Confusion and weakness EXAM: CT HEAD WITHOUT CONTRAST TECHNIQUE: Contiguous axial images were obtained from the base of the skull through the vertex without intravenous contrast. RADIATION DOSE REDUCTION: This exam was performed according to the departmental dose-optimization program which includes automated exposure control, adjustment of the mA and/or kV according to patient size and/or use of iterative reconstruction technique. COMPARISON:  03/30/2022 FINDINGS: Brain: Chronic right occipital infarct is noted. Mild atrophic changes and chronic white matter ischemic changes are seen. No acute hemorrhage or acute infarction is seen. Vascular: No hyperdense vessel or unexpected calcification. Skull: Normal. Negative for fracture or focal lesion. Sinuses/Orbits: No acute finding. Other: None. IMPRESSION: Chronic changes without acute abnormality. Electronically Signed   By: Inez Catalina M.D.   On: 09/05/2022 19:14   DG Chest Port 1 View  Result Date: 09/05/2022 CLINICAL DATA:  Questionable sepsis. EXAM: PORTABLE CHEST 1 VIEW  COMPARISON:  Chest x-ray January 21, 2022 FINDINGS: Cardiomegaly. The hila and mediastinum are unchanged. No pneumothorax. No nodules or masses. No focal infiltrates or overt edema. IMPRESSION: No active disease. Electronically Signed   By: Dorise Bullion III M.D.   On: 09/05/2022 17:57      IMPRESSION AND PLAN:  Assessment and Plan: * Sepsis due to undetermined organism [facility]) - The patient will be admitted to medical telemetry bed. - The source is likely a left lower extremity cellulitis.  I really doubt pyelonephritis in his case that is much less likely given negative UA. - We will continue antibiotic therapy with IV cefepime and Flagyl. - We will follow blood cultures. - An abdominal and pelvic CT scan is currently pending.  Cellulitis of left lower extremity - This involves the left lower leg and ankle. - We will continue him on IV vancomycin and cefepime and add Flagyl for additional anaerobic coverage. - We will follow blood cultures. - Warm compresses will be applied.  Acute kidney injury superimposed on chronic kidney disease ([place]) - The patient will be hydrated with IV normal saline. - We will follow BMP. - We will avoid nephrotoxins.  Hyponatremia - The patient will be hydrated with IV normal saline. - We will follow BMP.  Essential hypertension - We will continue amlodipine and Coreg and hold off Cozaar.  Dyslipidemia - We will continue statin therapy.  Type 2 diabetes mellitus with chronic kidney disease, without long-term current use of insulin ([place]) - The patient will be placed on supplemental coverage with NovoLog.   DVT prophylaxis: Lovenox.  Advanced Care Planning:  Code Status: full code.  Family Communication:  The plan of care was discussed in details with the patient (and family). I answered all questions. The patient agreed to proceed with the above mentioned plan. Further management will depend upon hospital course. Disposition Plan: Back to previous  home environment Consults called: none.  All the records are reviewed and case discussed with ED provider.  Status is: Inpatient  At the time of the admission, it appears that the appropriate admission status for this patient is inpatient.  This is judged to be reasonable and necessary in order to provide the required intensity of service to ensure the patient's safety given the presenting symptoms, physical exam findings and initial radiographic  and laboratory data in the context of comorbid conditions.  The patient requires inpatient status due to high intensity of service, high risk of further deterioration and high frequency of surveillance required.  I certify that at the time of admission, it is my clinical judgment that the patient will require inpatient hospital care extending more than 2 midnights.                            Dispo: The patient is from: Home              Anticipated d/c is to: Home              Patient currently is not medically stable to d/c.              Difficult to place patient: No  Christel Mormon M.D on 09/05/2022 at 10:38 PM  [facility]   From 7 PM-7 AM, contact night-coverage [email or web address]  CC: Primary care physician; [name], MD

## 2022-09-05 NOTE — Progress Notes (Signed)
PHARMACIST - PHYSICIAN COMMUNICATION  CONCERNING:  Enoxaparin (Lovenox) for DVT Prophylaxis    RECOMMENDATION: Patient was prescribed enoxaprin '40mg'$  q24 hours for VTE prophylaxis.   Filed Weights   09/05/22 1745  Weight: 108.8 kg (239 lb 12.8 oz)    Body mass index is 38.7 kg/m.  Estimated Creatinine Clearance: 29.5 mL/min (A) (by C-G formula based on SCr of 2.43 mg/dL (H)).   Patient is candidate for enoxaparin '30mg'$  every 24 hours based on CrCl <46m/min or Weight <45kg  DESCRIPTION: [facility]  Patient is now receiving enoxaparin 30 mg every 24 hours    HLorin Picket PharmD Clinical Pharmacist  09/05/2022 9:12 PM

## 2022-09-05 NOTE — Assessment & Plan Note (Signed)
-  The patient will be hydrated with IV normal saline. - We will follow BMP.

## 2022-09-05 NOTE — Consult Note (Signed)
PHARMACY -  BRIEF ANTIBIOTIC NOTE   Pharmacy has received consult(s) for cefepime and vancomycin dosing from an ED provider.  The patient's profile has been reviewed for ht/wt/allergies/indication/available labs.    One time order(s) placed for cefepime 2 grams IV and vancomycin 1000 mg IV  Further antibiotics/pharmacy consults should be ordered by admitting physician if indicated.                       Thank you, Lorin Picket, PharmD 09/05/2022  5:46 PM

## 2022-09-05 NOTE — ED Provider Notes (Signed)
[facility] Provider Note    Event Date/Time   First MD Initiated Contact with Patient 09/05/22 1743     (approximate)  History   Chief Complaint: Weakness, fever, confusion  HPI  Daniel Kline is a 77 y.o. male with a past medical history of anemia, atrial flutter, diabetes, hypertension, hyperlipidemia, polio with right arm deficits, presents to the emergency department for weakness confusion.  According to EMS they were called out to the patient's residence for weakness and confusion.  They found the patient to be febrile to 101.1.  Here the patient is awake alert he is oriented x 4 currently.  States he feels bad but is not able to describe exactly why.  States he feels weak.  Denies any pain.  No chest pain or abdominal pain.  Denies any recent vomiting or diarrhea.  Patient is covered in urine.  EMS states very strong urine smell at the patient's residence.    Physical Exam   General: Awake, appears fatigued but is oriented x 4. CV:  Good peripheral perfusion.  Regular rate and rhythm  Resp:  Mild tachypnea but no wheeze rales or rhonchi. Abd:  No distention.  Soft, nontender.  No rebound or guarding.  Umbilical hernia but easily reducible Other:  Mild erythema to the left side of the head possible head injury.   ED Results / Procedures / Treatments   EKG  EKG viewed and interpreted by myself shows what appears to be atrial fibrillation at 87 bpm with a narrow QRS, normal axis, normal intervals, no concerning ST changes.  RADIOLOGY  I have reviewed and interpreted CT head images.  No hemorrhage seen on my evaluation. Radiology has read the CT scan is chronic changes without acute abnormality.  MEDICATIONS ORDERED IN ED: Medications  lactated ringers infusion (has no administration in time range)  sodium chloride 0.9 % bolus 1,000 mL (has no administration in time range)  ceFEPIme (MAXIPIME) 2 g in sodium chloride 0.9 % 100 mL IVPB (has no  administration in time range)  metroNIDAZOLE (FLAGYL) IVPB 500 mg (has no administration in time range)  vancomycin (VANCOCIN) IVPB 1000 mg/200 mL premix (has no administration in time range)     IMPRESSION / MDM / ASSESSMENT AND PLAN / ED COURSE  I reviewed the triage vital signs and the nursing notes.  Patient's presentation is most consistent with acute presentation with potential threat to life or bodily function.  Patient presents emergency department for generalized weakness confusion found to be febrile per EMS.  Here the patient remains febrile 100.4 tachypneic at 24.  Patient is oriented in the emergency department.  Given the patient's fever and tachypnea meeting sepsis criteria we will check labs, cultures, urine, urine culture, chest x-ray, COVID/flu/RSV.  Will start broad-spectrum antibiotics as well as an IV fluid bolus.  On exam patient has mild erythema/contusion to left side of the head we will obtain CT imaging of the head as a precaution.  Wife per EMS states similar presentation previously with infectious etiology such as COVID.  Patient's workup shows an elevated lactic acid at 2.0 and an elevated white blood cell count of 21,000 again concerning for sepsis given the patient's initial fever.  Patient receiving broad-spectrum antibiotics.  Chest x-ray is clear no sign of pneumonia, COVID/flu/RSV is negative, urinalysis does not appear to show any concerning finding.  Chemistry largely within normal limits as well.  Given the patient's distended abdomen with umbilical hernia and recent reported diarrhea  we will obtain CT imaging the abdomen to further evaluate.  Will admit to the hospital service for ongoing workup and treatment.  Patient agreeable to plan of care.  CRITICAL CARE Performed by: Harvest Dark   Total critical care time: 30 minutes  Critical care time was exclusive of separately billable procedures and treating other patients.  Critical care was necessary  to treat or prevent imminent or life-threatening deterioration.  Critical care was time spent personally by me on the following activities: development of treatment plan with patient and/or surrogate as well as nursing, discussions with consultants, evaluation of patient's response to treatment, examination of patient, obtaining history from patient or surrogate, ordering and performing treatments and interventions, ordering and review of laboratory studies, ordering and review of radiographic studies, pulse oximetry and re-evaluation of patient's condition.  FINAL CLINICAL IMPRESSION(S) / ED DIAGNOSES   Confusion Sepsis    Note:  This document was prepared using Dragon voice recognition software and may include unintentional dictation errors.   Harvest Dark, MD 09/05/22 2040

## 2022-09-05 NOTE — Assessment & Plan Note (Addendum)
-  The patient will be admitted to medical telemetry bed. - The source is likely a left lower extremity cellulitis.  I really doubt pyelonephritis in his case that is much less likely given negative UA. - We will continue antibiotic therapy with IV cefepime and Flagyl. - We will follow blood cultures. - An abdominal and pelvic CT scan is currently pending.

## 2022-09-05 NOTE — Assessment & Plan Note (Signed)
-  We will continue amlodipine and Coreg and hold off Cozaar.

## 2022-09-05 NOTE — Progress Notes (Signed)
CODE SEPSIS - PHARMACY COMMUNICATION  **Broad Spectrum Antibiotics should be administered within 1 hour of Sepsis diagnosis**  Time Code Sepsis Called/Page Received: 1745  Antibiotics Ordered: cefepime and vancomycin  Time of 1st antibiotic administration: 1818  Additional action taken by pharmacy: Celeste ,PharmD Clinical Pharmacist  09/05/2022  5:48 PM

## 2022-09-05 NOTE — Consult Note (Signed)
Pharmacy Antibiotic Note  Daniel Kline is a 77 y.o. male admitted on 09/05/2022 with intra-abdominal infection/cellulitis.  Pharmacy has been consulted for cefepime & vancomycin dosing x 7 days.  1/28 @ 1818 Cefepime 2 grams IV x 1 given  Plan: Start Cefepime 2 grams IV every 24 hours  eCrCl < 30 ml/min Follow renal function for adjustments Provider has also ordered Flagyl 500 mg IV every 12 hours  Pt given Vancomycin 1 gm x 1 dose. Ordered addition Vancomycin 1500 mg x 1 dose base on pt wt of 108.8 kg. Vancomycin 1250 mg IV Q 48 hrs. Goal AUC 400-550. Expected AUC: 489.6 SCr used: 2.43, Vd used: 0.5, BMI 38.6  Pharmacy will continue to follow and adjust abx dosing whenever warranted.   Height: '5\' 6"'$  (167.6 cm) Weight: 108.8 kg (239 lb 12.8 oz) IBW/kg (Calculated) : 63.8  Temp (24hrs), Avg:99.1 F (37.3 C), Min:98.1 F (36.7 C), Max:100.4 F (38 C)  Recent Labs  Lab 09/05/22 1757 09/05/22 2051  WBC 21.6*  --   CREATININE 2.43*  --   LATICACIDVEN 2.0* 0.9     Estimated Creatinine Clearance: 29.5 mL/min (A) (by C-G formula based on SCr of 2.43 mg/dL (H)).    Allergies  Allergen Reactions   Bee Venom Anaphylaxis   Oxycodone Other (See Comments)    Delusions   Hydromorphone Other (See Comments)    hallucinating   Hydroxychloroquine     Other reaction(s): Other (See Comments) He broke out really badly.   Zolpidem Other (See Comments)    Antimicrobials this admission: Cefepime 1/28 >> x 7 days Flagyl 1/28 >>  Vancomycin 1/28 >> x 7 days  Dose adjustments this admission: N/A  Microbiology results: 1/28 BCx: pending  Thank you for allowing pharmacy to be a part of this patient's care.  Renda Rolls, PharmD, [facility] 09/05/2022 10:52 PM

## 2022-09-05 NOTE — ED Triage Notes (Signed)
Patient from home - EMS called because patient has been confused (A&O x 4 upon arrival here) and weak; EMS states that his house smelled strongly of urine; Patient is incontinent of urine upon arrival and appears unkept

## 2022-09-05 NOTE — ED Notes (Signed)
Pt gave this RN permission to speak with his wife mary.

## 2022-09-05 NOTE — Assessment & Plan Note (Signed)
-  We will continue statin therapy. 

## 2022-09-05 NOTE — Assessment & Plan Note (Signed)
-  This involves the left lower leg and ankle. - We will continue him on IV vancomycin and cefepime and add Flagyl for additional anaerobic coverage. - We will follow blood cultures. - Warm compresses will be applied.

## 2022-09-06 DIAGNOSIS — A419 Sepsis, unspecified organism: Secondary | ICD-10-CM | POA: Diagnosis not present

## 2022-09-06 LAB — CBC
HCT: 28.2 % — ABNORMAL LOW (ref 39.0–52.0)
Hemoglobin: 9 g/dL — ABNORMAL LOW (ref 13.0–17.0)
MCH: 30.9 pg (ref 26.0–34.0)
MCHC: 31.9 g/dL (ref 30.0–36.0)
MCV: 96.9 fL (ref 80.0–100.0)
Platelets: 284 10*3/uL (ref 150–400)
RBC: 2.91 MIL/uL — ABNORMAL LOW (ref 4.22–5.81)
RDW: 17.5 % — ABNORMAL HIGH (ref 11.5–15.5)
WBC: 15.8 10*3/uL — ABNORMAL HIGH (ref 4.0–10.5)
nRBC: 0.2 % (ref 0.0–0.2)

## 2022-09-06 LAB — BASIC METABOLIC PANEL
Anion gap: 10 (ref 5–15)
BUN: 28 mg/dL — ABNORMAL HIGH (ref 8–23)
CO2: 24 mmol/L (ref 22–32)
Calcium: 8.4 mg/dL — ABNORMAL LOW (ref 8.9–10.3)
Chloride: 102 mmol/L (ref 98–111)
Creatinine, Ser: 2.1 mg/dL — ABNORMAL HIGH (ref 0.61–1.24)
GFR, Estimated: 32 mL/min — ABNORMAL LOW (ref >60)
Glucose, Bld: 134 mg/dL — ABNORMAL HIGH (ref 70–99)
Potassium: 3.9 mmol/L (ref 3.5–5.1)
Sodium: 136 mmol/L (ref 135–145)

## 2022-09-06 LAB — GLUCOSE, CAPILLARY
Glucose-Capillary: 128 mg/dL — ABNORMAL HIGH (ref 70–99)
Glucose-Capillary: 106 mg/dL — ABNORMAL HIGH (ref 70–99)
Glucose-Capillary: 144 mg/dL — ABNORMAL HIGH (ref 70–99)
Glucose-Capillary: 88 mg/dL (ref 70–99)

## 2022-09-06 LAB — CORTISOL-AM, BLOOD: Cortisol - AM: 11.7 ug/dL (ref 6.7–22.6)

## 2022-09-06 LAB — PROTIME-INR
INR: 1.3 — ABNORMAL HIGH (ref 0.8–1.2)
Prothrombin Time: 16.2 seconds — ABNORMAL HIGH (ref 11.4–15.2)

## 2022-09-06 LAB — PROCALCITONIN: Procalcitonin: 0.24 ng/mL

## 2022-09-06 MED ORDER — INSULIN ASPART 100 UNIT/ML IJ SOLN
0.0000 [IU] | Freq: Three times a day (TID) | INTRAMUSCULAR | Status: DC
  Administered 2022-09-06 (×2): 2 [IU] via SUBCUTANEOUS
  Administered 2022-09-07 – 2022-09-08 (×2): 3 [IU] via SUBCUTANEOUS
  Administered 2022-09-08: 2 [IU] via SUBCUTANEOUS
  Administered 2022-09-09: 5 [IU] via SUBCUTANEOUS
  Administered 2022-09-10: 8 [IU] via SUBCUTANEOUS
  Administered 2022-09-10: 2 [IU] via SUBCUTANEOUS
  Administered 2022-09-11: 5 [IU] via SUBCUTANEOUS
  Administered 2022-09-11: 2 [IU] via SUBCUTANEOUS
  Filled 2022-09-06 (×10): qty 1

## 2022-09-06 MED ORDER — SODIUM CHLORIDE 0.9 % IV SOLN
2.0000 g | Freq: Two times a day (BID) | INTRAVENOUS | Status: DC
  Administered 2022-09-06 – 2022-09-07 (×3): 2 g via INTRAVENOUS
  Filled 2022-09-06 (×3): qty 12.5

## 2022-09-06 MED ORDER — VANCOMYCIN VARIABLE DOSE PER UNSTABLE RENAL FUNCTION (PHARMACIST DOSING): Status: DC

## 2022-09-06 MED ORDER — HEPARIN SODIUM (PORCINE) 5000 UNIT/ML IJ SOLN
5000.0000 [IU] | Freq: Three times a day (TID) | INTRAMUSCULAR | Status: DC
  Administered 2022-09-06 – 2022-09-11 (×14): 5000 [IU] via SUBCUTANEOUS
  Filled 2022-09-06 (×14): qty 1

## 2022-09-06 MED ORDER — LOSARTAN POTASSIUM 50 MG PO TABS
50.0000 mg | ORAL_TABLET | Freq: Every day | ORAL | Status: DC
  Administered 2022-09-06 – 2022-09-08 (×3): 50 mg via ORAL
  Filled 2022-09-06 (×3): qty 1

## 2022-09-06 NOTE — TOC Initial Note (Signed)
Transition of Care [facility]) - Initial/Assessment Note    Patient Details  Name: Daniel Kline MRN: [id number] Date of Birth: 11/01/1945  [facility] CM/SW Contact:    Beverly Sessions, RN Phone Number: 09/06/2022, 3:54 PM  Clinical Narrative:                  Met with patient and wife [name] at bedside.  Admitted for: sepsis Admitted from: Home with wife PCP: Silvio Pate, drives himself at baseline [facility]: [facility] Current home health/prior home health/DME: [facility] with [facility] and [facility]  Please consult TOC if discharge needs arise        Patient Goals and CMS Choice            Expected Discharge Plan and Services                                              Prior Living Arrangements/Services                       Activities of Daily Living Home Assistive Devices/Equipment: Cane (specify quad or straight) ADL Screening (condition at time of admission) Patient's cognitive ability adequate to safely complete daily activities?: Yes Is the patient deaf or have difficulty hearing?: Yes Does the patient have difficulty seeing, even when wearing glasses/contacts?: No Does the patient have difficulty concentrating, remembering, or making decisions?: No Patient able to express need for assistance with ADLs?: No Does the patient have difficulty dressing or bathing?: No Independently performs ADLs?: Yes (appropriate for developmental age) Does the patient have difficulty walking or climbing stairs?: Yes Weakness of Legs: Both Weakness of Arms/Hands: Right  Permission Sought/Granted                  Emotional Assessment              Admission diagnosis:  Confusion [R41.0] Sepsis due to undetermined organism ([place]) [A41.9] Sepsis, due to unspecified organism, unspecified whether acute organ dysfunction present [facility]) [A41.9] Patient Active Problem List   Diagnosis Date Noted   Sepsis due to undetermined organism ([place])  09/05/2022   Acute kidney injury superimposed on chronic kidney disease ([place]) 09/05/2022   Type 2 diabetes mellitus with chronic kidney disease, without long-term current use of insulin ([place]) 09/05/2022   Dyslipidemia 09/05/2022   Hyponatremia 09/05/2022   Cellulitis of left lower extremity 09/05/2022   Neutropenia ([place]) 02/03/2022   Paroxysmal atrial fibrillation ([place]) 01/23/2022   COVID-19 virus infection 01/21/2022   Mood disorder ([place]) 12/24/2021   Chronic renal disease, stage IV ([place]) 06/18/2021   Chronic pain 06/13/2020   Anemia in chronic kidney disease 04/02/2020   Benign hypertensive kidney disease with chronic kidney disease 04/02/2020   Proteinuria 04/02/2020   Renal mass 04/02/2020   Secondary hyperparathyroidism of renal origin ([place]) 04/02/2020   Preventative health care 03/10/2020   Total knee replacement status 01/28/2020   Encounter for long-term (current) use of high-risk medication 06/14/2019   Seronegative rheumatoid arthritis ([place]) 06/14/2019   Chronic narcotic dependence ([place]) 12/45/8099   Anti-cyclic citrullinated peptide antibody positive 05/03/2019   Synovitis of hand 05/03/2019   Radial neuropathy, left 09/06/2018   Type 2 diabetes mellitus with diabetic nephropathy ([place]) 06/22/2018   Chronic bilateral low back pain without sciatica 06/09/2018   Knee pain, chronic 04/12/2018   OSA (obstructive sleep apnea)  09/07/2017   History of poliomyelitis 08/27/2017   Localized edema 08/12/2017   Actinic keratosis 07/26/2017   History of total hip arthroplasty, bilateral 06/09/2016   GERD (gastroesophageal reflux disease) 04/21/2016   PONV (postoperative nausea and vomiting) 04/21/2016   Skin cancer 04/21/2016   Asymptomatic gallstones 01/06/2016   Venous stasis dermatitis of both lower extremities 10/29/2015   Stage 3b chronic kidney disease ([place]) 09/27/2015   Morbid obesity ([place]) 09/27/2015   Primary osteoarthritis of right knee 08/26/2015   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   Hypercholesterolemia 12/20/2011   Infection and inflammatory reaction due to internal prosthetic device, implant, and graft 12/20/2011   Essential hypertension 01/12/2010   PCP:  [name], MD Pharmacy:   [place], [place] - [address] [place] [address] Phone: [phone] Fax: [phone]  [facility] ([place], [facility] [address] [place] [address] Phone: [phone] Fax: [phone]  [id number] [address]Phone: [phone]: [address] [address]Phone: [phone]: [phone]    Social Determinants of Health (SDOH) Social History: [place] No Food Insecurity (09/05/2022)  Housing: Low Risk  (09/05/2022)  Transportation Needs: No Transportation Needs (09/05/2022)  Utilities: Not At Risk (09/05/2022)  Depression (PHQ2-9): Low Risk  (09/21/2021)  Financial Resource Strain: Low Risk  (07/20/2017)  Social Connections: Unknown (07/20/2017)  Tobacco Use: Medium Risk (09/05/2022)   SDOH Interventions:     Readmission Risk Interventions    09/06/2022    3:47 PM 01/27/2022   12:27 PM  Readmission Risk Prevention Plan  Transportation Screening Complete Complete  PCP or Specialist Appt within 3-5 Days  Complete  HRI or [place] Complete  Social Work Consult for [place]/Counseling  Complete  Palliative Care Screening  Complete  Medication Review (Press photographer Complete Complete  SW Recovery Care/Counseling Consult Complete   Palliative Care Screening Not Applicable

## 2022-09-06 NOTE — Progress Notes (Signed)
PROGRESS NOTE    Daniel Kline   WER:[id number] DOB: 1946-04-13  DOA: 09/05/2022 Date of Service: 09/06/22 PCP: [name], MD     Brief Narrative / Hospital Course:  Daniel Kline is a 77 y.o. Caucasian male with medical history significant for anxiety, osteoarthritis, atrial flutter, chronic kidney disease,  polymyositis with right arm deficits, type 2 diabetes mellitus, diastolic dysfunction, hypertension, dyslipidemia and OSA, who presented to the emergency room with acute onset of generalized weakness and altered mental status with confusion.  01/28: febrile, tachypneic, WBC 21, Lactate 2.0, Cr 2.4, Neg COVID/flu, CXR non-acute. UA no concerns. CT head no concerns. CT abd/pelv no obvious infection. Most likely source appears to be cellulitis LLE. Was given IV cefepime vancomycin and Flagyl, 1 L bolus of IV normal saline followed by IV lactated Ringer at 150 mill per hour. Admitted to hospitalist service treating sepsis/cellulitis. Continuing cefepime and flagyl, follow BCx. Fluids for AKI and hyponatremia.  01/29: afebrile, WBC improved to 15, lactate improve to 0.9, BCx NG<12h, Cr improved some to 2.1 which is about his baseline.   Consultants:  none  Procedures: none      ASSESSMENT & PLAN:   Principal Problem:   Sepsis due to undetermined organism [facility]) Active Problems:   Cellulitis of left lower extremity   Acute kidney injury superimposed on chronic kidney disease ([place])   Hyponatremia   Essential hypertension   Type 2 diabetes mellitus with chronic kidney disease, without long-term current use of insulin ([place])   Dyslipidemia   Sepsis due to undetermined organism ([place]) Cellulitis of left lower extremity source is likely a left lower extremity cellulitis.  doubt pyelonephritis in his case that is much less likely given negative UA. IV cefepime, vancomycin, and metronidazole. follow blood cultures. Deescalate as able pending cultures   Acute kidney  injury superimposed on chronic kidney disease Stage 3b ([place]) - resolved Stop IV fluids  Follow BMP  Hyponatremia - resolved Stop IV fluids  Follow BMP  Essential hypertension continue amlodipine and Coreg  held off Cozaar on admission d/t renal fxn, can probably restart this given elevated BP  Type 2 diabetes mellitus with chronic kidney disease, without long-term current use of insulin ([place]) supplemental coverage with NovoLog.  Dyslipidemia statin therapy.     DVT prophylaxis: heparin Pertinent IV fluids/nutrition: stopped IV fluids  Central lines / invasive devices: none  Code Status: FULL CODE  Current Admission Status: inpatient   TOC needs / Dispo plan: none at this time, expect will need PT/OT eval  Barriers to discharge / significant pending items: IV abx pending BCx and clinical improvement              Subjective / Brief ROS:  Patient reports pain is improved Feeling better today  Denies CP/SOB.  Pain controlled.  Denies new weakness.  Tolerating diet  Reports no concerns w/ urination/defecation.   Family Communication: wife at bedside on rounds     Objective Findings:  Vitals:   09/05/22 2100 09/05/22 2208 09/06/22 0421 09/06/22 0753  BP: 139/75 (!) 152/86 (!) 164/73 (!) 152/75  Pulse: 94 90 64 62  Resp: '20 17 15 16  '$ Temp:  98.1 F (36.7 C) 97.8 F (36.6 C) 98 F (36.7 C)  TempSrc:  Oral Oral Oral  SpO2: 96% 97% 99% 95%  Weight:      Height:        Intake/Output Summary (Last 24 hours) at 09/06/2022 1506 Last data filed at 09/06/2022 1300  Gross per 24 hour  Intake 3247.3 ml  Output 1901 ml  Net 1346.3 ml   Filed Weights   09/05/22 1745  Weight: 108.8 kg    Examination:  Physical Exam Constitutional:      General: He is not in acute distress.    Appearance: He is obese.  HENT:     Head: Atraumatic.  Eyes:     Extraocular Movements: Extraocular movements intact.  Cardiovascular:     Rate and Rhythm: Normal rate and  regular rhythm.  Pulmonary:     Effort: Pulmonary effort is normal.     Breath sounds: Normal breath sounds.  Abdominal:     General: Abdomen is flat. Bowel sounds are normal.     Palpations: Abdomen is soft.  Musculoskeletal:        General: Normal range of motion.     Cervical back: Neck supple.  Skin:    General: Skin is warm and dry.     Comments: Erythema mild lateral L ankle  Neurological:     General: No focal deficit present.     Mental Status: He is alert and oriented to person, place, and time.  Psychiatric:        Mood and Affect: Mood normal.        Behavior: Behavior normal.          Scheduled Medications:   amLODipine  5 mg Oral Daily   carvedilol  3.125 mg Oral BID WC   cholecalciferol  10,000 Units Oral Daily   ferrous sulfate  325 mg Oral QHS   folic acid  1 mg Oral Daily   glipiZIDE  2.5 mg Oral BID AC   heparin injection (subcutaneous)  5,000 Units Subcutaneous Q8H   insulin aspart  0-15 Units Subcutaneous TID WC   isosorbide mononitrate  60 mg Oral QAC lunch   multivitamin with minerals  1 tablet Oral Daily   mupirocin ointment  1 Application Topical Daily   rosuvastatin  20 mg Oral Daily   timolol  1 drop Both Eyes BID   vancomycin variable dose per unstable renal function (pharmacist dosing)   Does not apply See admin instructions    Continuous Infusions:  sodium chloride 100 mL/hr at 09/06/22 0920   ceFEPime (MAXIPIME) IV 2 g (09/06/22 1338)   metronidazole 500 mg (09/06/22 0542)    PRN Medications:  acetaminophen **OR** acetaminophen, clobetasol cream, clorazepate, EPINEPHrine, HYDROcodone-acetaminophen, hydrOXYzine, ketoconazole, ondansetron **OR** ondansetron (ZOFRAN) IV, traZODone  Antimicrobials from admission:  Anti-infectives (From admission, onward)    Start     Dose/Rate Route Frequency Ordered Stop   09/07/22 2200  vancomycin (VANCOREADY) IVPB 1250 mg/250 mL  Status:  Discontinued        1,250 mg 166.7 mL/hr over 90 Minutes  Intravenous Every 48 hours 09/05/22 2253 09/06/22 1159   09/06/22 1800  ceFEPIme (MAXIPIME) 2 g in sodium chloride 0.9 % 100 mL IVPB  Status:  Discontinued        2 g 200 mL/hr over 30 Minutes Intravenous Every 24 hours 09/05/22 2226 09/05/22 2252   09/06/22 1800  ceFEPIme (MAXIPIME) 2 g in sodium chloride 0.9 % 100 mL IVPB  Status:  Discontinued        2 g 200 mL/hr over 30 Minutes Intravenous Every 24 hours 09/05/22 2252 09/06/22 1154   09/06/22 1400  ceFEPIme (MAXIPIME) 2 g in sodium chloride 0.9 % 100 mL IVPB        2 g 200 mL/hr over 30  Minutes Intravenous Every 12 hours 09/06/22 1154 09/12/22 1359   09/06/22 1200  vancomycin variable dose per unstable renal function (pharmacist dosing)         Does not apply See admin instructions 09/06/22 1201     09/06/22 0800  ceFEPIme (MAXIPIME) 2 g in sodium chloride 0.9 % 100 mL IVPB  Status:  Discontinued        2 g 200 mL/hr over 30 Minutes Intravenous Every 24 hours 09/05/22 2116 09/05/22 2241   09/06/22 0800  metroNIDAZOLE (FLAGYL) IVPB 500 mg  Status:  Discontinued        500 mg 100 mL/hr over 60 Minutes Intravenous Every 12 hours 09/05/22 2226 09/06/22 0819   09/06/22 0630  metroNIDAZOLE (FLAGYL) IVPB 500 mg        500 mg 100 mL/hr over 60 Minutes Intravenous Every 12 hours 09/05/22 2106 09/13/22 0629   09/05/22 2315  vancomycin (VANCOREADY) IVPB 1500 mg/300 mL        1,500 mg 150 mL/hr over 120 Minutes Intravenous  Once 09/05/22 2226 09/06/22 0749   09/05/22 2115  ceFEPIme (MAXIPIME) 2 g in sodium chloride 0.9 % 100 mL IVPB  Status:  Discontinued        2 g 200 mL/hr over 30 Minutes Intravenous  Once 09/05/22 2106 09/05/22 2110   09/05/22 1800  ceFEPIme (MAXIPIME) 2 g in sodium chloride 0.9 % 100 mL IVPB        2 g 200 mL/hr over 30 Minutes Intravenous  Once 09/05/22 1745 09/05/22 1848   09/05/22 1800  metroNIDAZOLE (FLAGYL) IVPB 500 mg        500 mg 100 mL/hr over 60 Minutes Intravenous  Once 09/05/22 1745 09/05/22 1933   09/05/22  1800  vancomycin (VANCOCIN) IVPB 1000 mg/200 mL premix        1,000 mg 200 mL/hr over 60 Minutes Intravenous  Once 09/05/22 1745 09/05/22 2038           Data Reviewed:  I have personally reviewed the following...  CBC: Recent Labs  Lab 09/05/22 1757 09/06/22 0438  WBC 21.6* 15.8*  NEUTROABS 16.6*  --   HGB 9.7* 9.0*  HCT 30.1* 28.2*  MCV 97.7 96.9  PLT 354 213   Basic Metabolic Panel: Recent Labs  Lab 09/05/22 1757 09/06/22 0438  NA 134* 136  K 3.9 3.9  CL 100 102  CO2 25 24  GLUCOSE 120* 134*  BUN 31* 28*  CREATININE 2.43* 2.10*  CALCIUM 8.8* 8.4*   GFR: Estimated Creatinine Clearance: 34.1 mL/min (A) (by C-G formula based on SCr of 2.1 mg/dL (H)). Liver Function Tests: Recent Labs  Lab 09/05/22 1757  AST 19  ALT 17  ALKPHOS 54  BILITOT 1.0  PROT 7.0  ALBUMIN 3.5   No results for input(s): "LIPASE", "AMYLASE" in the last 168 hours. No results for input(s): "AMMONIA" in the last 168 hours. Coagulation Profile: Recent Labs  Lab 09/05/22 1757 09/06/22 0438  INR 1.2 1.3*   Cardiac Enzymes: Recent Labs  Lab 09/05/22 1757  CKTOTAL 32*   BNP (last 3 results) No results for input(s): "PROBNP" in the last 8760 hours. HbA1C: No results for input(s): "HGBA1C" in the last 72 hours. CBG: Recent Labs  Lab 09/05/22 2210 09/06/22 0755 09/06/22 1208  GLUCAP 135* 128* 106*   Lipid Profile: No results for input(s): "CHOL", "HDL", "LDLCALC", "TRIG", "CHOLHDL", "LDLDIRECT" in the last 72 hours. Thyroid Function Tests: No results for input(s): "TSH", "T4TOTAL", "FREET4", "T3FREE", "THYROIDAB" in  the last 72 hours. Anemia Panel: No results for input(s): "VITAMINB12", "FOLATE", "FERRITIN", "TIBC", "IRON", "RETICCTPCT" in the last 72 hours. Most Recent Urinalysis On File:     Component Value Date/Time   COLORURINE YELLOW (A) 09/05/2022 1757   APPEARANCEUR CLEAR (A) 09/05/2022 1757   APPEARANCEUR Clear 10/18/2011 0958   LABSPEC 1.013 09/05/2022 1757    LABSPEC 1.021 10/18/2011 0958   PHURINE 7.0 09/05/2022 1757   GLUCOSEU 50 (A) 09/05/2022 1757   GLUCOSEU Negative 10/18/2011 0958   HGBUR SMALL (A) 09/05/2022 1757   BILIRUBINUR NEGATIVE 09/05/2022 1757   BILIRUBINUR Negative 10/18/2011 [place] 09/05/2022 1757   PROTEINUR 100 (A) 09/05/2022 1757   NITRITE NEGATIVE 09/05/2022 1757   LEUKOCYTESUR NEGATIVE 09/05/2022 1757   LEUKOCYTESUR Negative 10/18/2011 0958   Sepsis Labs: '@LABRCNTIP'$ (procalcitonin:4,lacticidven:4) Microbiology: Recent Results (from the past 240 hour(s))  Blood Culture (routine x 2)     Status: None (Preliminary result)   Collection Time: 09/05/22  5:58 PM   Specimen: BLOOD LEFT FOREARM  Result Value Ref Range Status   Specimen Description BLOOD LEFT FOREARM  Final   Special Requests   Final    BOTTLES DRAWN AEROBIC AND ANAEROBIC Blood Culture results may not be optimal due to an excessive volume of blood received in culture bottles   Culture   Final    NO GROWTH < 12 HOURS Performed at [facility], [address], [place], [place] [address]    Report Status PENDING  Incomplete  Blood Culture (routine x 2)     Status: None (Preliminary result)   Collection Time: 09/05/22  5:58 PM   Specimen: BLOOD LEFT FOREARM  Result Value Ref Range Status   Specimen Description BLOOD LEFT FOREARM  Final   Special Requests   Final    BOTTLES DRAWN AEROBIC AND ANAEROBIC Blood Culture results may not be optimal due to an excessive volume of blood received in culture bottles   Culture   Final    NO GROWTH < 12 HOURS Performed at [facility], [address], [place], [place] [address]    Report Status PENDING  Incomplete  Resp panel by RT-PCR (RSV, Flu A&B, Covid) Anterior Nasal Swab     Status: None   Collection Time: 09/05/22  6:15 PM   Specimen: Anterior Nasal Swab  Result Value Ref Range Status   SARS Coronavirus 2 by RT PCR NEGATIVE NEGATIVE Final    Comment: (NOTE) SARS-CoV-2  target nucleic acids are NOT DETECTED.  The SARS-CoV-2 RNA is generally detectable in upper respiratory specimens during the acute phase of infection. The lowest concentration of SARS-CoV-2 viral copies this assay can detect is 138 copies/mL. A negative result does not preclude SARS-Cov-2 infection and should not be used as the sole basis for treatment or other patient management decisions. A negative result may occur with  improper specimen collection/handling, submission of specimen other than nasopharyngeal swab, presence of viral mutation(s) within the areas targeted by this assay, and inadequate number of viral copies(<138 copies/mL). A negative result must be combined with clinical observations, patient history, and epidemiological information. The expected result is Negative.  Fact Sheet for Patients:  EntrepreneurPulse.com.au  Fact Sheet for Healthcare Providers:  IncredibleEmployment.be  This test is no t yet approved or cleared by the [place] FDA and  has been authorized for detection and/or diagnosis of SARS-CoV-2 by FDA under an Emergency Use Authorization (EUA). This EUA will remain  in effect (meaning this test can be  used) for the duration of the COVID-19 declaration under Section 564(b)(1) of the Act, 21 U.S.C.section 360bbb-3(b)(1), unless the authorization is terminated  or revoked sooner.       Influenza A by PCR NEGATIVE NEGATIVE Final   Influenza B by PCR NEGATIVE NEGATIVE Final    Comment: (NOTE) The Xpert Xpress SARS-CoV-2/FLU/RSV plus assay is intended as an aid in the diagnosis of influenza from Nasopharyngeal swab specimens and should not be used as a sole basis for treatment. Nasal washings and aspirates are unacceptable for Xpert Xpress SARS-CoV-2/FLU/RSV testing.  Fact Sheet for Patients: EntrepreneurPulse.com.au  Fact Sheet for Healthcare  Providers: IncredibleEmployment.be  This test is not yet approved or cleared by the [place] FDA and has been authorized for detection and/or diagnosis of SARS-CoV-2 by FDA under an Emergency Use Authorization (EUA). This EUA will remain in effect (meaning this test can be used) for the duration of the COVID-19 declaration under Section 564(b)(1) of the Act, 21 U.S.C. section 360bbb-3(b)(1), unless the authorization is terminated or revoked.     Resp Syncytial Virus by PCR NEGATIVE NEGATIVE Final    Comment: (NOTE) Fact Sheet for Patients: EntrepreneurPulse.com.au  Fact Sheet for Healthcare Providers: IncredibleEmployment.be  This test is not yet approved or cleared by the [place] FDA and has been authorized for detection and/or diagnosis of SARS-CoV-2 by FDA under an Emergency Use Authorization (EUA). This EUA will remain in effect (meaning this test can be used) for the duration of the COVID-19 declaration under Section 564(b)(1) of the Act, 21 U.S.C. section 360bbb-3(b)(1), unless the authorization is terminated or revoked.  Performed at [facility], [address] [place], [place] [address]       Radiology Studies last 3 days: CT ABDOMEN PELVIS WO CONTRAST  Result Date: 09/05/2022 CLINICAL DATA:  77 year old male admitted on 09/05/2022 with intra-abdominal infection. Acute nonlocalized abdominal pain. EXAM: CT ABDOMEN AND PELVIS WITHOUT CONTRAST TECHNIQUE: Multidetector CT imaging of the abdomen and pelvis was performed following the standard protocol without IV contrast. RADIATION DOSE REDUCTION: This exam was performed according to the departmental dose-optimization program which includes automated exposure control, adjustment of the mA and/or kV according to patient size and/or use of iterative reconstruction technique. COMPARISON:  None Available. FINDINGS: Lower chest: Respiratory motion obscures  the lung bases. No acute abnormality. Hepatobiliary: The hepatic contour demonstrates mild nodularity suggestive of cirrhosis. The gallbladder is decompressed. Mild gallbladder wall thickening may be due to decompression and/or cirrhosis. No biliary dilation. Pancreas: Unremarkable. Spleen: Unremarkable. Adrenals/Urinary Tract: Normal adrenal glands. Bilateral cortical renal scarring. No urinary calculi or hydronephrosis. Nonspecific symmetric stranding about both kidneys. Unremarkable bladder. Stomach/Bowel: Colonic diverticulosis without diverticulitis. Normal appendix. Normal caliber large and small bowel. Unremarkable stomach. Small hiatal hernia. Vascular/Lymphatic: Aortic atherosclerotic calcification. No enlarged abdominal or pelvic lymph nodes. Reproductive: Unremarkable. Other: Fat containing umbilical hernia. No free intraperitoneal fluid or air. Musculoskeletal: Bilateral total hip replacements. No acute osseous abnormality. Posterior fusion L2-L4. Thoracolumbar spondylosis. IMPRESSION: Nonspecific stranding about both kidneys is favored to be a normal finding though pyelonephritis is not excluded. Otherwise no evidence of infection in the abdomen or pelvis. Nodular contour of the liver suggestive of cirrhosis. Mild wall thickening of the gallbladder likely due to decompression and cirrhosis. Colonic diverticulosis without diverticulitis. Electronically Signed   By: Placido Sou M.D.   On: 09/05/2022 21:35   CT HEAD WO CONTRAST (5MM)  Result Date: 09/05/2022 CLINICAL DATA:  Confusion and weakness EXAM: CT HEAD WITHOUT CONTRAST TECHNIQUE: Contiguous axial images  were obtained from the base of the skull through the vertex without intravenous contrast. RADIATION DOSE REDUCTION: This exam was performed according to the departmental dose-optimization program which includes automated exposure control, adjustment of the mA and/or kV according to patient size and/or use of iterative reconstruction  technique. COMPARISON:  03/30/2022 FINDINGS: Brain: Chronic right occipital infarct is noted. Mild atrophic changes and chronic white matter ischemic changes are seen. No acute hemorrhage or acute infarction is seen. Vascular: No hyperdense vessel or unexpected calcification. Skull: Normal. Negative for fracture or focal lesion. Sinuses/Orbits: No acute finding. Other: None. IMPRESSION: Chronic changes without acute abnormality. Electronically Signed   By: Inez Catalina M.D.   On: 09/05/2022 19:14   DG Chest Port 1 View  Result Date: 09/05/2022 CLINICAL DATA:  Questionable sepsis. EXAM: PORTABLE CHEST 1 VIEW COMPARISON:  Chest x-ray January 21, 2022 FINDINGS: Cardiomegaly. The hila and mediastinum are unchanged. No pneumothorax. No nodules or masses. No focal infiltrates or overt edema. IMPRESSION: No active disease. Electronically Signed   By: Dorise Bullion III M.D.   On: 09/05/2022 17:57             LOS: 1 day      Emeterio Reeve, DO [facility] 09/06/2022, 3:06 PM    Dictation software may have been used to generate the above note. Typos may occur and escape review in typed/dictated notes. Please contact Dr Sheppard Coil directly for clarity if needed.  Staff may message me via secure chat in [place]  but this may not receive an immediate response,  please page me for urgent matters!  If 7PM-7AM, please contact night coverage [email or web address]

## 2022-09-06 NOTE — Progress Notes (Signed)
Pt stated he was not going to wear CPAP tonight. Stated he use to wear CPAP but doesn't anymore.

## 2022-09-06 NOTE — Consult Note (Signed)
Pharmacy Antibiotic Note  Daniel Kline is a 77 y.o. male admitted on 09/05/2022 with intra-abdominal infection/cellulitis.  Pharmacy has been consulted for cefepime & vancomycin dosing x 7 days.   Plan: Change  Cefepime 2 grams IV to every 12 hours (6 more days)  CrCl improved to  34.1 (Scr 2.1)  Vancomycin 2500 mg loading given 1/28, will order random [place] on 1/30 with AM labs due to BMI > 30 and rapidly changing Scr. Will continue pulse dosing for now.  Pharmacy will continue to follow and adjust abx dosing whenever warranted.   Height: '5\' 6"'$  (167.6 cm) Weight: 108.8 kg (239 lb 12.8 oz) IBW/kg (Calculated) : 63.8  Temp (24hrs), Avg:98.6 F (37 C), Min:97.8 F (36.6 C), Max:100.4 F (38 C)  Recent Labs  Lab 09/05/22 1757 09/05/22 2051 09/06/22 0438  WBC 21.6*  --  15.8*  CREATININE 2.43*  --  2.10*  LATICACIDVEN 2.0* 0.9  --      Estimated Creatinine Clearance: 34.1 mL/min (A) (by C-G formula based on SCr of 2.1 mg/dL (H)).    Allergies  Allergen Reactions   Bee Venom Anaphylaxis   Oxycodone Other (See Comments)    Delusions   Hydromorphone Other (See Comments)    hallucinating   Hydroxychloroquine     Other reaction(s): Other (See Comments) He broke out really badly.   Zolpidem Other (See Comments)    Antimicrobials this admission: Cefepime 1/28 >>   Flagyl 1/28 >>   Vancomycin 1/28 >>  Dose adjustments this admission: N/A  Microbiology results: 1/28 BCx: pending  Thank you for allowing pharmacy to be a part of this patient's care.  Jeania Nater Rodriguez-Guzman PharmD, BCPS 09/06/2022 12:01 PM

## 2022-09-07 DIAGNOSIS — A419 Sepsis, unspecified organism: Secondary | ICD-10-CM | POA: Diagnosis not present

## 2022-09-07 LAB — VANCOMYCIN, RANDOM: Vancomycin Rm: 12 ug/mL

## 2022-09-07 LAB — CBC
HCT: 31 % — ABNORMAL LOW (ref 39.0–52.0)
Hemoglobin: 10 g/dL — ABNORMAL LOW (ref 13.0–17.0)
MCH: 31 pg (ref 26.0–34.0)
MCHC: 32.3 g/dL (ref 30.0–36.0)
MCV: 96 fL (ref 80.0–100.0)
Platelets: 254 10*3/uL (ref 150–400)
RBC: 3.23 MIL/uL — ABNORMAL LOW (ref 4.22–5.81)
RDW: 17.2 % — ABNORMAL HIGH (ref 11.5–15.5)
WBC: 20.9 10*3/uL — ABNORMAL HIGH (ref 4.0–10.5)
nRBC: 0.2 % (ref 0.0–0.2)

## 2022-09-07 LAB — PROCALCITONIN: Procalcitonin: 0.23 ng/mL

## 2022-09-07 LAB — BASIC METABOLIC PANEL
Anion gap: 7 (ref 5–15)
BUN: 24 mg/dL — ABNORMAL HIGH (ref 8–23)
CO2: 23 mmol/L (ref 22–32)
Calcium: 8.4 mg/dL — ABNORMAL LOW (ref 8.9–10.3)
Chloride: 106 mmol/L (ref 98–111)
Creatinine, Ser: 2.03 mg/dL — ABNORMAL HIGH (ref 0.61–1.24)
GFR, Estimated: 33 mL/min — ABNORMAL LOW (ref >60)
Glucose, Bld: 120 mg/dL — ABNORMAL HIGH (ref 70–99)
Potassium: 3.6 mmol/L (ref 3.5–5.1)
Sodium: 136 mmol/L (ref 135–145)

## 2022-09-07 LAB — BLOOD GAS, ARTERIAL
Acid-base deficit: 4 mmol/L — ABNORMAL HIGH (ref 0.0–2.0)
Bicarbonate: 19.6 mmol/L — ABNORMAL LOW (ref 20.0–28.0)
FIO2: 0.21 %
O2 Saturation: 97.3 %
Patient temperature: 37
pCO2 arterial: 31 mmHg — ABNORMAL LOW (ref 32–48)
pH, Arterial: 7.41 (ref 7.35–7.45)
pO2, Arterial: 85 mmHg (ref 83–108)

## 2022-09-07 LAB — GLUCOSE, CAPILLARY
Glucose-Capillary: 183 mg/dL — ABNORMAL HIGH (ref 70–99)
Glucose-Capillary: 88 mg/dL (ref 70–99)

## 2022-09-07 MED ORDER — SODIUM CHLORIDE 0.9 % IV SOLN
2.0000 g | INTRAVENOUS | Status: DC
  Administered 2022-09-08 – 2022-09-11 (×4): 2 g via INTRAVENOUS
  Filled 2022-09-07: qty 20
  Filled 2022-09-07: qty 2
  Filled 2022-09-07 (×2): qty 20

## 2022-09-07 MED ORDER — VANCOMYCIN HCL 750 MG/150ML IV SOLN
750.0000 mg | INTRAVENOUS | Status: DC
  Administered 2022-09-07: 750 mg via INTRAVENOUS
  Filled 2022-09-07 (×2): qty 150

## 2022-09-07 NOTE — Progress Notes (Addendum)
PROGRESS NOTE    Daniel Kline   FUX:[id number] DOB: Jun 14, 1946  DOA: 09/05/2022 Date of Service: 09/07/22 PCP: [name], MD     Brief Narrative / Hospital Course:  Daniel Kline is a 77 y.o. Caucasian male with medical history significant for anxiety, osteoarthritis, atrial flutter, chronic kidney disease,  polymyositis with right arm deficits, type 2 diabetes mellitus, diastolic dysfunction, hypertension, dyslipidemia and OSA, who presented to the emergency room with acute onset of generalized weakness and altered mental status with confusion.  01/28: febrile, tachypneic, WBC 21, Lactate 2.0, Cr 2.4, Neg COVID/flu, CXR non-acute. UA no concerns. CT head no concerns. CT abd/pelv no obvious infection. Most likely source appears to be cellulitis LLE. Was given IV cefepime vancomycin and Flagyl, 1 L bolus of IV normal saline followed by IV lactated Ringer at 150 mill per hour. Admitted to hospitalist service treating sepsis/cellulitis. Continuing cefepime and flagyl, follow BCx. Fluids for AKI and hyponatremia.  01/29: afebrile, WBC improved to 15, lactate improve to 0.9, BCx NG<12h, Cr improved some to 2.1 which is about his baseline.  01/30: encephalopathic this morning (wife reports he was more confused starting yesterday around 2pm but no notes that staff was alerted or noticed a change), refusing CPAP last night, got ABG this morning but no hypercarbia. Pt was wincing and "feeling bad" but not given a description of symptoms. Motor/sensation intact and baseline. Wife states he was like this when he had COVID last year. Per RN patient released flatus and felt immensely better. EKG c/w previous, question Aflutter but it seems c/w previous read by cardiology as sinus w/ PACs. Stopped cefepime - appreciate pharmacy's help w/ confirming similar reaction in the past of confusion on this medication. Holding off on ID/neuro consult for now but would have low threshold.    Consultants:   none  Procedures: none      ASSESSMENT & PLAN:   Principal Problem:   Sepsis due to undetermined organism [facility]) Active Problems:   Cellulitis of left lower extremity   Acute kidney injury superimposed on chronic kidney disease ([place])   Hyponatremia   Essential hypertension   Type 2 diabetes mellitus with chronic kidney disease, without long-term current use of insulin (HCC)   Dyslipidemia   Metabolic encephalopathy d/t medication effect Given history of essentially same mental status changes when he had received cefepime in 01/2022 when hospitalized then, per wife, d/c cefepime and switch to ceftriaxone Low threshold for neuro consult if not improving but doubt CNS infection given exam and afebrile   Sepsis due to undetermined organism ([place]) Cellulitis of left lower extremity source seems most likely a left lower extremity cellulitis though erythema seems mild for his severity of systemic illness  No UTI, no evidence of pneumonia on CXR< no evidence of intraabdominal infection on CT AbdPelv, CT head nonacute  IV cefepime, vancomycin, and metronidazole --> IV ceftriaxone (stopped cefepime d/t encephalopathy as above) + vancomycin, can probably d/c vancomycin tomorrow  follow blood cultures --> NG x2d  If worse especially headache, fever, would ask neurology to consult re: LP or other brain imaging  Hx Atrial flutter EKG today 09/07/22 appears rate controlled, similar to previous EKG read as sinus w/ PAC Restart telemetry given encephalopathic  Acute kidney injury superimposed on chronic kidney disease Stage 3b ([place]) - resolved Stop IV fluids  Follow BMP  Hyponatremia - resolved Stop IV fluids  Follow BMP  Essential hypertension continue amlodipine and Coreg  held off Cozaar on admission  d/t renal fxn, restart this given elevated BP but started at lower dose   Type 2 diabetes mellitus with chronic kidney disease, without long-term current use of insulin  (HCC) supplemental coverage with NovoLog.  Dyslipidemia statin therapy.     DVT prophylaxis: heparin Pertinent IV fluids/nutrition: stopped IV fluids  Central lines / invasive devices: none  Code Status: FULL CODE  Current Admission Status: inpatient   TOC needs / Dispo plan: none at this time, expect will need PT/OT eval  Barriers to discharge / significant pending items: IV abx pending clinical improvement              Subjective / Brief ROS:  Patient reports "feel bad" but cannot elaborate Will not answer orientation questions Seems uncomfortable Seems improved and in better spirits directly after prolonged flatus per RN  Denies headache, chest pain, trouble breathing     Family Communication: spoke w/ wife over the phone today, confirms he has ahd similar presentation/confusion last year when he had COVID< she noticed it started yesterday around 2pm but did not alert staff of change    Objective Findings:    Vitals:   09/06/22 0753 09/06/22 1507 09/06/22 1928 09/07/22 1400  BP: (!) 152/75 (!) 155/68 (!) 151/73 (!) 162/91  Pulse: 62 67 81 88  Resp: '16 17 17   '$ Temp: 98 F (36.7 C) 99.1 F (37.3 C) 98.4 F (36.9 C) 98.5 F (36.9 C)  TempSrc: Oral     SpO2: 95% 99% 96% 95%  Weight:      Height:        Intake/Output Summary (Last 24 hours) at 09/07/2022 1709 Last data filed at 09/07/2022 1500 Gross per 24 hour  Intake 2548.35 ml  Output 650 ml  Net 1898.35 ml   Filed Weights   09/05/22 1745  Weight: 108.8 kg    Examination:  Physical Exam Constitutional:      General: He is not in acute distress.    Appearance: He is obese. He is ill-appearing. He is not diaphoretic.  HENT:     Head: Atraumatic.  Eyes:     Extraocular Movements: Extraocular movements intact.  Cardiovascular:     Rate and Rhythm: Normal rate and regular rhythm.  Pulmonary:     Breath sounds: Normal breath sounds.     Comments: Increased RR Abdominal:     General:  Bowel sounds are normal. There is distension.     Palpations: Abdomen is soft.     Tenderness: There is abdominal tenderness. There is no guarding or rebound.  Musculoskeletal:     Cervical back: Neck supple. No rigidity.     Right lower leg: No edema.     Left lower leg: No edema.     Comments: Moves legs on command with some encouragement, weak RUE (hx polymyositis, know RUE weakness)   Skin:    General: Skin is warm and dry.     Comments: Erythema mild lateral L ankle  Neurological:     Mental Status: He is alert. He is disoriented.     Cranial Nerves: No cranial nerve deficit.          Scheduled Medications:   amLODipine  5 mg Oral Daily   carvedilol  3.125 mg Oral BID WC   cholecalciferol  10,000 Units Oral Daily   ferrous sulfate  325 mg Oral QHS   folic acid  1 mg Oral Daily   glipiZIDE  2.5 mg Oral BID AC   heparin injection (  subcutaneous)  5,000 Units Subcutaneous Q8H   insulin aspart  0-15 Units Subcutaneous TID WC   isosorbide mononitrate  60 mg Oral QAC lunch   losartan  50 mg Oral Daily   multivitamin with minerals  1 tablet Oral Daily   mupirocin ointment  1 Application Topical Daily   rosuvastatin  20 mg Oral Daily   timolol  1 drop Both Eyes BID    Continuous Infusions:  [START ON 09/08/2022] cefTRIAXone (ROCEPHIN)  IV     vancomycin Stopped (09/07/22 1420)    PRN Medications:  acetaminophen **OR** acetaminophen, clobetasol cream, clorazepate, EPINEPHrine, HYDROcodone-acetaminophen, hydrOXYzine, ketoconazole, ondansetron **OR** ondansetron (ZOFRAN) IV, traZODone  Antimicrobials from admission:  Anti-infectives (From admission, onward)    Start     Dose/Rate Route Frequency Ordered Stop   09/08/22 1000  cefTRIAXone (ROCEPHIN) 2 g in sodium chloride 0.9 % 100 mL IVPB        2 g 200 mL/hr over 30 Minutes Intravenous Every 24 hours 09/07/22 1422     09/07/22 2200  vancomycin (VANCOREADY) IVPB 1250 mg/250 mL  Status:  Discontinued        1,250  mg 166.7 mL/hr over 90 Minutes Intravenous Every 48 hours 09/05/22 2253 09/06/22 1159   09/07/22 1200  vancomycin (VANCOREADY) IVPB 750 mg/150 mL        750 mg 150 mL/hr over 60 Minutes Intravenous Every 24 hours 09/07/22 0935     09/06/22 1800  ceFEPIme (MAXIPIME) 2 g in sodium chloride 0.9 % 100 mL IVPB  Status:  Discontinued        2 g 200 mL/hr over 30 Minutes Intravenous Every 24 hours 09/05/22 2226 09/05/22 2252   09/06/22 1800  ceFEPIme (MAXIPIME) 2 g in sodium chloride 0.9 % 100 mL IVPB  Status:  Discontinued        2 g 200 mL/hr over 30 Minutes Intravenous Every 24 hours 09/05/22 2252 09/06/22 1154   09/06/22 1400  ceFEPIme (MAXIPIME) 2 g in sodium chloride 0.9 % 100 mL IVPB  Status:  Discontinued        2 g 200 mL/hr over 30 Minutes Intravenous Every 12 hours 09/06/22 1154 09/07/22 1422   09/06/22 1200  vancomycin variable dose per unstable renal function (pharmacist dosing)  Status:  Discontinued         Does not apply See admin instructions 09/06/22 1201 09/07/22 0935   09/06/22 0800  ceFEPIme (MAXIPIME) 2 g in sodium chloride 0.9 % 100 mL IVPB  Status:  Discontinued        2 g 200 mL/hr over 30 Minutes Intravenous Every 24 hours 09/05/22 2116 09/05/22 2241   09/06/22 0800  metroNIDAZOLE (FLAGYL) IVPB 500 mg  Status:  Discontinued        500 mg 100 mL/hr over 60 Minutes Intravenous Every 12 hours 09/05/22 2226 09/06/22 0819   09/06/22 0630  metroNIDAZOLE (FLAGYL) IVPB 500 mg  Status:  Discontinued        500 mg 100 mL/hr over 60 Minutes Intravenous Every 12 hours 09/05/22 2106 09/07/22 1422   09/05/22 2315  vancomycin (VANCOREADY) IVPB 1500 mg/300 mL        1,500 mg 150 mL/hr over 120 Minutes Intravenous  Once 09/05/22 2226 09/06/22 0749   09/05/22 2115  ceFEPIme (MAXIPIME) 2 g in sodium chloride 0.9 % 100 mL IVPB  Status:  Discontinued        2 g 200 mL/hr over 30 Minutes Intravenous  Once 09/05/22 2106 09/05/22 2110  09/05/22 1800  ceFEPIme (MAXIPIME) 2 g in sodium  chloride 0.9 % 100 mL IVPB        2 g 200 mL/hr over 30 Minutes Intravenous  Once 09/05/22 1745 09/05/22 1848   09/05/22 1800  metroNIDAZOLE (FLAGYL) IVPB 500 mg        500 mg 100 mL/hr over 60 Minutes Intravenous  Once 09/05/22 1745 09/05/22 1933   09/05/22 1800  vancomycin (VANCOCIN) IVPB 1000 mg/200 mL premix        1,000 mg 200 mL/hr over 60 Minutes Intravenous  Once 09/05/22 1745 09/05/22 2038           Data Reviewed:  I have personally reviewed the following...  CBC: Recent Labs  Lab 09/05/22 1757 09/06/22 0438 09/07/22 0432  WBC 21.6* 15.8* 20.9*  NEUTROABS 16.6*  --   --   HGB 9.7* 9.0* 10.0*  HCT 30.1* 28.2* 31.0*  MCV 97.7 96.9 96.0  PLT 354 284 751   Basic Metabolic Panel: Recent Labs  Lab 09/05/22 1757 09/06/22 0438 09/07/22 0432  NA 134* 136 136  K 3.9 3.9 3.6  CL 100 102 106  CO2 '25 24 23  '$ GLUCOSE 120* 134* 120*  BUN 31* 28* 24*  CREATININE 2.43* 2.10* 2.03*  CALCIUM 8.8* 8.4* 8.4*   GFR: Estimated Creatinine Clearance: 35.3 mL/min (A) (by C-G formula based on SCr of 2.03 mg/dL (H)). Liver Function Tests: Recent Labs  Lab 09/05/22 1757  AST 19  ALT 17  ALKPHOS 54  BILITOT 1.0  PROT 7.0  ALBUMIN 3.5   No results for input(s): "LIPASE", "AMYLASE" in the last 168 hours. No results for input(s): "AMMONIA" in the last 168 hours. Coagulation Profile: Recent Labs  Lab 09/05/22 1757 09/06/22 0438  INR 1.2 1.3*   Cardiac Enzymes: Recent Labs  Lab 09/05/22 1757  CKTOTAL 32*   BNP (last 3 results) No results for input(s): "PROBNP" in the last 8760 hours. HbA1C: No results for input(s): "HGBA1C" in the last 72 hours. CBG: Recent Labs  Lab 09/06/22 0755 09/06/22 1208 09/06/22 1611 09/06/22 2112 09/07/22 1348  GLUCAP 128* 106* 144* 88 183*   Lipid Profile: No results for input(s): "CHOL", "HDL", "LDLCALC", "TRIG", "CHOLHDL", "LDLDIRECT" in the last 72 hours. Thyroid Function Tests: No results for input(s): "TSH", "T4TOTAL",  "FREET4", "T3FREE", "THYROIDAB" in the last 72 hours. Anemia Panel: No results for input(s): "VITAMINB12", "FOLATE", "FERRITIN", "TIBC", "IRON", "RETICCTPCT" in the last 72 hours. Most Recent Urinalysis On File:     Component Value Date/Time   COLORURINE YELLOW (A) 09/05/2022 1757   APPEARANCEUR CLEAR (A) 09/05/2022 1757   APPEARANCEUR Clear 10/18/2011 0958   LABSPEC 1.013 09/05/2022 1757   LABSPEC 1.021 10/18/2011 0958   PHURINE 7.0 09/05/2022 1757   GLUCOSEU 50 (A) 09/05/2022 1757   GLUCOSEU Negative 10/18/2011 0958   HGBUR SMALL (A) 09/05/2022 1757   BILIRUBINUR NEGATIVE 09/05/2022 1757   BILIRUBINUR Negative 10/18/2011 [place] 09/05/2022 1757   PROTEINUR 100 (A) 09/05/2022 1757   NITRITE NEGATIVE 09/05/2022 1757   LEUKOCYTESUR NEGATIVE 09/05/2022 1757   LEUKOCYTESUR Negative 10/18/2011 0958   Sepsis Labs: '@LABRCNTIP'$ (procalcitonin:4,lacticidven:4) Microbiology: Recent Results (from the past 240 hour(s))  Blood Culture (routine x 2)     Status: None (Preliminary result)   Collection Time: 09/05/22  5:58 PM   Specimen: BLOOD LEFT FOREARM  Result Value Ref Range Status   Specimen Description BLOOD LEFT FOREARM  Final   Special Requests   Final    BOTTLES  DRAWN AEROBIC AND ANAEROBIC Blood Culture results may not be optimal due to an excessive volume of blood received in culture bottles   Culture   Final    NO GROWTH 2 DAYS Performed at [facility], [address] [place], [place] [address]    Report Status PENDING  Incomplete  Blood Culture (routine x 2)     Status: None (Preliminary result)   Collection Time: 09/05/22  5:58 PM   Specimen: BLOOD LEFT FOREARM  Result Value Ref Range Status   Specimen Description BLOOD LEFT FOREARM  Final   Special Requests   Final    BOTTLES DRAWN AEROBIC AND ANAEROBIC Blood Culture results may not be optimal due to an excessive volume of blood received in culture bottles   Culture   Final    NO GROWTH 2  DAYS Performed at [facility], [address], [place], [place] [address]    Report Status PENDING  Incomplete  Resp panel by RT-PCR (RSV, Flu A&B, Covid) Anterior Nasal Swab     Status: None   Collection Time: 09/05/22  6:15 PM   Specimen: Anterior Nasal Swab  Result Value Ref Range Status   SARS Coronavirus 2 by RT PCR NEGATIVE NEGATIVE Final    Comment: (NOTE) SARS-CoV-2 target nucleic acids are NOT DETECTED.  The SARS-CoV-2 RNA is generally detectable in upper respiratory specimens during the acute phase of infection. The lowest concentration of SARS-CoV-2 viral copies this assay can detect is 138 copies/mL. A negative result does not preclude SARS-Cov-2 infection and should not be used as the sole basis for treatment or other patient management decisions. A negative result may occur with  improper specimen collection/handling, submission of specimen other than nasopharyngeal swab, presence of viral mutation(s) within the areas targeted by this assay, and inadequate number of viral copies(<138 copies/mL). A negative result must be combined with clinical observations, patient history, and epidemiological information. The expected result is Negative.  Fact Sheet for Patients:  EntrepreneurPulse.com.au  Fact Sheet for Healthcare Providers:  IncredibleEmployment.be  This test is no t yet approved or cleared by the [place] FDA and  has been authorized for detection and/or diagnosis of SARS-CoV-2 by FDA under an Emergency Use Authorization (EUA). This EUA will remain  in effect (meaning this test can be used) for the duration of the COVID-19 declaration under Section 564(b)(1) of the Act, 21 U.S.C.section 360bbb-3(b)(1), unless the authorization is terminated  or revoked sooner.       Influenza A by PCR NEGATIVE NEGATIVE Final   Influenza B by PCR NEGATIVE NEGATIVE Final    Comment: (NOTE) The Xpert Xpress  SARS-CoV-2/FLU/RSV plus assay is intended as an aid in the diagnosis of influenza from Nasopharyngeal swab specimens and should not be used as a sole basis for treatment. Nasal washings and aspirates are unacceptable for Xpert Xpress SARS-CoV-2/FLU/RSV testing.  Fact Sheet for Patients: EntrepreneurPulse.com.au  Fact Sheet for Healthcare Providers: IncredibleEmployment.be  This test is not yet approved or cleared by the [place] FDA and has been authorized for detection and/or diagnosis of SARS-CoV-2 by FDA under an Emergency Use Authorization (EUA). This EUA will remain in effect (meaning this test can be used) for the duration of the COVID-19 declaration under Section 564(b)(1) of the Act, 21 U.S.C. section 360bbb-3(b)(1), unless the authorization is terminated or revoked.     Resp Syncytial Virus by PCR NEGATIVE NEGATIVE Final    Comment: (NOTE) Fact Sheet for Patients: EntrepreneurPulse.com.au  Fact Sheet for Healthcare  Providers: IncredibleEmployment.be  This test is not yet approved or cleared by the [place] and has been authorized for detection and/or diagnosis of SARS-CoV-2 by FDA under an Emergency Use Authorization (EUA). This EUA will remain in effect (meaning this test can be used) for the duration of the COVID-19 declaration under Section 564(b)(1) of the Act, 21 U.S.C. section 360bbb-3(b)(1), unless the authorization is terminated or revoked.  Performed at [facility], [address] [place], [place] [address]       Radiology Studies last 3 days: CT ABDOMEN PELVIS WO CONTRAST  Result Date: 09/05/2022 CLINICAL DATA:  77 year old male admitted on 09/05/2022 with intra-abdominal infection. Acute nonlocalized abdominal pain. EXAM: CT ABDOMEN AND PELVIS WITHOUT CONTRAST TECHNIQUE: Multidetector CT imaging of the abdomen and pelvis was performed following the standard  protocol without IV contrast. RADIATION DOSE REDUCTION: This exam was performed according to the departmental dose-optimization program which includes automated exposure control, adjustment of the mA and/or kV according to patient size and/or use of iterative reconstruction technique. COMPARISON:  None Available. FINDINGS: Lower chest: Respiratory motion obscures the lung bases. No acute abnormality. Hepatobiliary: The hepatic contour demonstrates mild nodularity suggestive of cirrhosis. The gallbladder is decompressed. Mild gallbladder wall thickening may be due to decompression and/or cirrhosis. No biliary dilation. Pancreas: Unremarkable. Spleen: Unremarkable. Adrenals/Urinary Tract: Normal adrenal glands. Bilateral cortical renal scarring. No urinary calculi or hydronephrosis. Nonspecific symmetric stranding about both kidneys. Unremarkable bladder. Stomach/Bowel: Colonic diverticulosis without diverticulitis. Normal appendix. Normal caliber large and small bowel. Unremarkable stomach. Small hiatal hernia. Vascular/Lymphatic: Aortic atherosclerotic calcification. No enlarged abdominal or pelvic lymph nodes. Reproductive: Unremarkable. Other: Fat containing umbilical hernia. No free intraperitoneal fluid or air. Musculoskeletal: Bilateral total hip replacements. No acute osseous abnormality. Posterior fusion L2-L4. Thoracolumbar spondylosis. IMPRESSION: Nonspecific stranding about both kidneys is favored to be a normal finding though pyelonephritis is not excluded. Otherwise no evidence of infection in the abdomen or pelvis. Nodular contour of the liver suggestive of cirrhosis. Mild wall thickening of the gallbladder likely due to decompression and cirrhosis. Colonic diverticulosis without diverticulitis. Electronically Signed   By: Placido Sou M.D.   On: 09/05/2022 21:35   CT HEAD WO CONTRAST (5MM)  Result Date: 09/05/2022 CLINICAL DATA:  Confusion and weakness EXAM: CT HEAD WITHOUT CONTRAST TECHNIQUE:  Contiguous axial images were obtained from the base of the skull through the vertex without intravenous contrast. RADIATION DOSE REDUCTION: This exam was performed according to the departmental dose-optimization program which includes automated exposure control, adjustment of the mA and/or kV according to patient size and/or use of iterative reconstruction technique. COMPARISON:  03/30/2022 FINDINGS: Brain: Chronic right occipital infarct is noted. Mild atrophic changes and chronic white matter ischemic changes are seen. No acute hemorrhage or acute infarction is seen. Vascular: No hyperdense vessel or unexpected calcification. Skull: Normal. Negative for fracture or focal lesion. Sinuses/Orbits: No acute finding. Other: None. IMPRESSION: Chronic changes without acute abnormality. Electronically Signed   By: Inez Catalina M.D.   On: 09/05/2022 19:14   DG Chest Port 1 View  Result Date: 09/05/2022 CLINICAL DATA:  Questionable sepsis. EXAM: PORTABLE CHEST 1 VIEW COMPARISON:  Chest x-ray January 21, 2022 FINDINGS: Cardiomegaly. The hila and mediastinum are unchanged. No pneumothorax. No nodules or masses. No focal infiltrates or overt edema. IMPRESSION: No active disease. Electronically Signed   By: Dorise Bullion III M.D.   On: 09/05/2022 17:57          Time spent 50 min   LOS: 2  days      Emeterio Reeve, DO [facility] 09/07/2022, 5:09 PM    Dictation software may have been used to generate the above note. Typos may occur and escape review in typed/dictated notes. Please contact Dr Sheppard Coil directly for clarity if needed.  Staff may message me via secure chat in [place]  but this may not receive an immediate response,  please page me for urgent matters!  If 7PM-7AM, please contact night coverage [email or web address]

## 2022-09-07 NOTE — Consult Note (Signed)
Pharmacy Antibiotic Note  Daniel Kline is a 77 y.o. male w/ PMH of anxiety, osteoarthritis, atrial flutter, chronic kidney disease,  polymyositis with right arm deficits, type 2 diabetes mellitus, diastolic dysfunction, hypertension, dyslipidemia and OSA, RA, glaucoma admitted on 09/05/2022 with icellulitis.  Pharmacy has been consulted for cefepime & vancomycin dosing. Renal function has improved since admission  Plan:  1) continue cefepime 2 grams IV every 12 hours   2) vancomycin vancomycin 2500 mg loading given 1/28, random level 12 mcg/mL Start vancomycin 750 mg IV Q 24 hrs Goal AUC 400-550. Expected AUC: 505.5 SCr used: 2.03 mg/dL Pharmacy will continue to follow and adjust abx dosing whenever warranted.   Height: '5\' 6"'$  (167.6 cm) Weight: 108.8 kg (239 lb 12.8 oz) IBW/kg (Calculated) : 63.8  Temp (24hrs), Avg:98.8 F (37.1 C), Min:98.4 F (36.9 C), Max:99.1 F (37.3 C)  Recent Labs  Lab 09/05/22 1757 09/05/22 2051 09/06/22 0438 09/07/22 0432  WBC 21.6*  --  15.8* 20.9*  CREATININE 2.43*  --  2.10* 2.03*  LATICACIDVEN 2.0* 0.9  --   --   VANCORANDOM  --   --   --  12     Estimated Creatinine Clearance: 35.3 mL/min (A) (by C-G formula based on SCr of 2.03 mg/dL (H)).    Allergies  Allergen Reactions   Bee Venom Anaphylaxis   Oxycodone Other (See Comments)    Delusions   Hydromorphone Other (See Comments)    hallucinating   Hydroxychloroquine     Other reaction(s): Other (See Comments) He broke out really badly.   Zolpidem Other (See Comments)    Antimicrobials this admission: cefepime 1/28 >>  Flagyl 1/28 >>  vancomycin 1/28 >>  Microbiology results: 1/28 BCx: NGTD 1/28 SARS CoV-2, RSV, influenza: negative  Thank you for allowing pharmacy to be a part of this patient's care.  Vallery Sa, PharmD, BCPS 09/07/2022 9:33 AM

## 2022-09-08 ENCOUNTER — Inpatient Hospital Stay: Payer: Medicare Other

## 2022-09-08 DIAGNOSIS — R600 Localized edema: Secondary | ICD-10-CM | POA: Diagnosis not present

## 2022-09-08 DIAGNOSIS — R410 Disorientation, unspecified: Secondary | ICD-10-CM | POA: Diagnosis not present

## 2022-09-08 DIAGNOSIS — N179 Acute kidney failure, unspecified: Secondary | ICD-10-CM

## 2022-09-08 DIAGNOSIS — A419 Sepsis, unspecified organism: Secondary | ICD-10-CM | POA: Diagnosis not present

## 2022-09-08 LAB — CBC
HCT: 29.6 % — ABNORMAL LOW (ref 39.0–52.0)
Hemoglobin: 9.9 g/dL — ABNORMAL LOW (ref 13.0–17.0)
MCH: 31.8 pg (ref 26.0–34.0)
MCHC: 33.4 g/dL (ref 30.0–36.0)
MCV: 95.2 fL (ref 80.0–100.0)
Platelets: 248 10*3/uL (ref 150–400)
RBC: 3.11 MIL/uL — ABNORMAL LOW (ref 4.22–5.81)
RDW: 17 % — ABNORMAL HIGH (ref 11.5–15.5)
WBC: 20.9 10*3/uL — ABNORMAL HIGH (ref 4.0–10.5)
nRBC: 0.1 % (ref 0.0–0.2)

## 2022-09-08 LAB — BASIC METABOLIC PANEL
Anion gap: 8 (ref 5–15)
BUN: 24 mg/dL — ABNORMAL HIGH (ref 8–23)
CO2: 21 mmol/L — ABNORMAL LOW (ref 22–32)
Calcium: 8.8 mg/dL — ABNORMAL LOW (ref 8.9–10.3)
Chloride: 107 mmol/L (ref 98–111)
Creatinine, Ser: 1.93 mg/dL — ABNORMAL HIGH (ref 0.61–1.24)
GFR, Estimated: 35 mL/min — ABNORMAL LOW (ref >60)
Glucose, Bld: 90 mg/dL (ref 70–99)
Potassium: 3.6 mmol/L (ref 3.5–5.1)
Sodium: 136 mmol/L (ref 135–145)

## 2022-09-08 LAB — GLUCOSE, CAPILLARY
Glucose-Capillary: 95 mg/dL (ref 70–99)
Glucose-Capillary: 200 mg/dL — ABNORMAL HIGH (ref 70–99)
Glucose-Capillary: 181 mg/dL — ABNORMAL HIGH (ref 70–99)

## 2022-09-08 MED ORDER — LOSARTAN POTASSIUM 50 MG PO TABS
100.0000 mg | ORAL_TABLET | Freq: Every day | ORAL | Status: DC
  Administered 2022-09-09 – 2022-09-11 (×3): 100 mg via ORAL
  Filled 2022-09-08 (×3): qty 2

## 2022-09-08 NOTE — Progress Notes (Signed)
PROGRESS NOTE  Daniel Kline   UXN:[id number] DOB: 10/01/1945  DOA: 09/05/2022 Date of Service: 09/08/22 PCP: [name], MD  Brief Narrative / Hospital Course:  Daniel Kline is a 77 y.o. male with medical history significant for anxiety, osteoarthritis, atrial flutter, chronic kidney disease,  polymyositis with right arm deficits, type 2 diabetes mellitus, diastolic dysfunction, hypertension, dyslipidemia and OSA, who presented to the emergency room with acute onset of generalized weakness and altered mental status with confusion.  01/28: febrile, tachypneic, WBC 21, Lactate 2.0, Cr 2.4, Neg COVID/flu, CXR non-acute. UA no concerns. CT head no concerns. CT abd/pelv no obvious infection. Most likely source appears to be cellulitis LLE. Was given IV cefepime vancomycin and Flagyl, 1 L bolus of IV normal saline followed by IV lactated Ringer at 150 mill per hour. Admitted to hospitalist service treating sepsis/cellulitis. Continuing cefepime and flagyl, follow BCx. Fluids for AKI and hyponatremia.  01/29: afebrile, WBC improved to 15, lactate improve to 0.9, BCx NG<12h, Cr improved some to 2.1 which is about his baseline.  01/30: encephalopathic this morning (wife reports he was more confused starting yesterday around 2pm but no notes that staff was alerted or noticed a change), refusing CPAP last night, got ABG this morning but no hypercarbia. Pt was wincing and "feeling bad" but not given a description of symptoms. Motor/sensation intact and baseline. Wife states he was like this when he had COVID last year. Per RN patient released flatus and felt immensely better. EKG c/w previous, question Aflutter but it seems c/w previous read by cardiology as sinus w/ PACs. Stopped cefepime - appreciate pharmacy's help w/ confirming similar reaction in the past of confusion on this medication. Holding off on ID/neuro consult for now but would have low threshold.  1/31: per review of previous AMS,  patient appears improved and close to baseline on my assessment today. Stable ORA.   Consultants:  none  Procedures: none  ASSESSMENT & PLAN:   Principal Problem:   Sepsis due to undetermined organism [facility]) Active Problems:   Cellulitis of left lower extremity   Acute kidney injury superimposed on chronic kidney disease ([place])   Hyponatremia   Essential hypertension   Type 2 diabetes mellitus with chronic kidney disease, without long-term current use of insulin (HCC)   Dyslipidemia   Metabolic encephalopathy d/t medication effect- resolved 1/31 Given history of essentially same mental status changes when he had received cefepime in 01/2022 when hospitalized then, per wife, d/c cefepime and switch to ceftriaxone Low threshold for neuro consult if not improving but doubt CNS infection given exam and afebrile   Sepsis due to undetermined organism ([place]) Cellulitis of left lower extremity source seems most likely a left lower extremity cellulitis though erythema seems mild for his severity of systemic illness. BxCx NGTD No UTI, no evidence of pneumonia on CXR< no evidence of intraabdominal infection on CT AbdPelv, CT head nonacute. Pain with palpation of bilateral feet, worse on L. IV cefepime, vancomycin, and metronidazole --> IV ceftriaxone (stopped cefepime d/t encephalopathy as above)  Stopping vancomycin today Continue f/u BxCx LE doppler to evaluate DVT given erythema and pain with palpation. Symmetrical swelling.   Hx Atrial flutter- not anticoagulated Continue telemetry  AKI superimposed on chronic kidney disease Stage 3b ([place]) - resolved  Follow BMP  Hyponatremia - resolved Follow BMP  Essential hypertension continue amlodipine and Coreg, losartan  Type 2 diabetes mellitus with chronic kidney disease, without long-term current use of insulin ([place]) supplemental coverage with  NovoLog.  Dyslipidemia statin therapy.   DVT prophylaxis: heparin Pertinent IV  fluids/nutrition: stopped IV fluids  Central lines / invasive devices: none  Code Status: FULL CODE  Current Admission Status: inpatient   TOC needs / Dispo plan: none at this time, expect will need PT/OT eval  Barriers to discharge / significant pending items: IV Ax, pending clinical improvement     Subjective / Brief ROS:  Patient is oriented but slow to respond. Denies concerns at this time. Has not pain at rest. Endorses pain upon palpation of bilateral feet.   Family Communication: none at bedside. Will update with changes   Objective Findings:    Vitals:   09/06/22 1507 09/06/22 1928 09/07/22 1400 09/08/22 0732  BP: (!) 155/68 (!) 151/73 (!) 162/91 (!) 150/79  Pulse: 67 81 88 93  Resp: '17 17  18  '$ Temp: 99.1 F (37.3 C) 98.4 F (36.9 C) 98.5 F (36.9 C)   TempSrc:   Oral Oral  SpO2: 99% 96% 95% 97%  Weight:      Height:        Intake/Output Summary (Last 24 hours) at 09/08/2022 1134 Last data filed at 09/08/2022 1125 Gross per 24 hour  Intake 150 ml  Output 2150 ml  Net -2000 ml   Filed Weights   09/05/22 1745  Weight: 108.8 kg    Examination:  Physical Exam Constitutional:      General: He is not in acute distress.    Appearance: He is obese. He is not ill-appearing or diaphoretic.  HENT:     Head: Atraumatic.  Cardiovascular:     Rate and Rhythm: Normal rate and regular rhythm.  Pulmonary:     Effort: Pulmonary effort is normal. No respiratory distress.     Breath sounds: Normal breath sounds.     Comments: Increased RR Abdominal:     General: Bowel sounds are normal. There is no distension.     Palpations: Abdomen is soft.     Tenderness: There is no abdominal tenderness. There is no guarding or rebound.  Musculoskeletal:        General: Tenderness (bilateral feet) present.     Right lower leg: Edema present.     Left lower leg: Edema present.     Comments: Moves legs on command with some encouragement, weak RUE (hx polymyositis, know RUE  weakness)   Skin:    General: Skin is warm and dry.     Comments: Erythema mild lateral L ankle  Neurological:     General: No focal deficit present.     Mental Status: He is alert. He is disoriented.  Psychiatric:        Mood and Affect: Mood normal.    Scheduled Medications:   amLODipine  5 mg Oral Daily   carvedilol  3.125 mg Oral BID WC   cholecalciferol  10,000 Units Oral Daily   ferrous sulfate  325 mg Oral QHS   folic acid  1 mg Oral Daily   glipiZIDE  2.5 mg Oral BID AC   heparin injection (subcutaneous)  5,000 Units Subcutaneous Q8H   insulin aspart  0-15 Units Subcutaneous TID WC   isosorbide mononitrate  60 mg Oral QAC lunch   [START ON 09/09/2022] losartan  100 mg Oral Daily   multivitamin with minerals  1 tablet Oral Daily   mupirocin ointment  1 Application Topical Daily   rosuvastatin  20 mg Oral Daily   timolol  1 drop Both Eyes BID  Continuous Infusions:  cefTRIAXone (ROCEPHIN)  IV 2 g (09/08/22 1004)    PRN Medications:  acetaminophen **OR** acetaminophen, clobetasol cream, clorazepate, EPINEPHrine, HYDROcodone-acetaminophen, hydrOXYzine, ketoconazole, ondansetron **OR** ondansetron (ZOFRAN) IV, traZODone  Antimicrobials from admission:  Anti-infectives (From admission, onward)    Start     Dose/Rate Route Frequency Ordered Stop   09/08/22 1000  cefTRIAXone (ROCEPHIN) 2 g in sodium chloride 0.9 % 100 mL IVPB        2 g 200 mL/hr over 30 Minutes Intravenous Every 24 hours 09/07/22 1422     09/07/22 2200  vancomycin (VANCOREADY) IVPB 1250 mg/250 mL  Status:  Discontinued        1,250 mg 166.7 mL/hr over 90 Minutes Intravenous Every 48 hours 09/05/22 2253 09/06/22 1159   09/07/22 1200  vancomycin (VANCOREADY) IVPB 750 mg/150 mL  Status:  Discontinued        750 mg 150 mL/hr over 60 Minutes Intravenous Every 24 hours 09/07/22 0935 09/08/22 1129   09/06/22 1800  ceFEPIme (MAXIPIME) 2 g in sodium chloride 0.9 % 100 mL IVPB  Status:  Discontinued        2  g 200 mL/hr over 30 Minutes Intravenous Every 24 hours 09/05/22 2226 09/05/22 2252   09/06/22 1800  ceFEPIme (MAXIPIME) 2 g in sodium chloride 0.9 % 100 mL IVPB  Status:  Discontinued        2 g 200 mL/hr over 30 Minutes Intravenous Every 24 hours 09/05/22 2252 09/06/22 1154   09/06/22 1400  ceFEPIme (MAXIPIME) 2 g in sodium chloride 0.9 % 100 mL IVPB  Status:  Discontinued        2 g 200 mL/hr over 30 Minutes Intravenous Every 12 hours 09/06/22 1154 09/07/22 1422   09/06/22 1200  vancomycin variable dose per unstable renal function (pharmacist dosing)  Status:  Discontinued         Does not apply See admin instructions 09/06/22 1201 09/07/22 0935   09/06/22 0800  ceFEPIme (MAXIPIME) 2 g in sodium chloride 0.9 % 100 mL IVPB  Status:  Discontinued        2 g 200 mL/hr over 30 Minutes Intravenous Every 24 hours 09/05/22 2116 09/05/22 2241   09/06/22 0800  metroNIDAZOLE (FLAGYL) IVPB 500 mg  Status:  Discontinued        500 mg 100 mL/hr over 60 Minutes Intravenous Every 12 hours 09/05/22 2226 09/06/22 0819   09/06/22 0630  metroNIDAZOLE (FLAGYL) IVPB 500 mg  Status:  Discontinued        500 mg 100 mL/hr over 60 Minutes Intravenous Every 12 hours 09/05/22 2106 09/07/22 1422   09/05/22 2315  vancomycin (VANCOREADY) IVPB 1500 mg/300 mL        1,500 mg 150 mL/hr over 120 Minutes Intravenous  Once 09/05/22 2226 09/06/22 0749   09/05/22 2115  ceFEPIme (MAXIPIME) 2 g in sodium chloride 0.9 % 100 mL IVPB  Status:  Discontinued        2 g 200 mL/hr over 30 Minutes Intravenous  Once 09/05/22 2106 09/05/22 2110   09/05/22 1800  ceFEPIme (MAXIPIME) 2 g in sodium chloride 0.9 % 100 mL IVPB        2 g 200 mL/hr over 30 Minutes Intravenous  Once 09/05/22 1745 09/05/22 1848   09/05/22 1800  metroNIDAZOLE (FLAGYL) IVPB 500 mg        500 mg 100 mL/hr over 60 Minutes Intravenous  Once 09/05/22 1745 09/05/22 1933   09/05/22 1800  vancomycin (VANCOCIN) IVPB  1000 mg/200 mL premix        1,000 mg 200 mL/hr  over 60 Minutes Intravenous  Once 09/05/22 1745 09/05/22 2038       Data Reviewed:  I have personally reviewed the following...  CBC: Recent Labs  Lab 09/05/22 1757 09/06/22 0438 09/07/22 0432 09/08/22 0458  WBC 21.6* 15.8* 20.9* 20.9*  NEUTROABS 16.6*  --   --   --   HGB 9.7* 9.0* 10.0* 9.9*  HCT 30.1* 28.2* 31.0* 29.6*  MCV 97.7 96.9 96.0 95.2  PLT 354 284 254 683   Basic Metabolic Panel: Recent Labs  Lab 09/05/22 1757 09/06/22 0438 09/07/22 0432 09/08/22 0458  NA 134* 136 136 136  K 3.9 3.9 3.6 3.6  CL 100 102 106 107  CO2 '25 24 23 '$ 21*  GLUCOSE 120* 134* 120* 90  BUN 31* 28* 24* 24*  CREATININE 2.43* 2.10* 2.03* 1.93*  CALCIUM 8.8* 8.4* 8.4* 8.8*   GFR: Estimated Creatinine Clearance: 36.5 mL/min (A) (by C-G formula based on SCr of 1.93 mg/dL (H)). Liver Function Tests: Recent Labs  Lab 09/05/22 1757  AST 19  ALT 17  ALKPHOS 54  BILITOT 1.0  PROT 7.0  ALBUMIN 3.5   Coagulation Profile: Recent Labs  Lab 09/05/22 1757 09/06/22 0438  INR 1.2 1.3*   Cardiac Enzymes: Recent Labs  Lab 09/05/22 1757  CKTOTAL 32*    CBG: Recent Labs  Lab 09/06/22 1611 09/06/22 2112 09/07/22 1348 09/07/22 2029 09/08/22 0733  GLUCAP 144* 88 183* 88 95   Most Recent Urinalysis On File:     Component Value Date/Time   COLORURINE YELLOW (A) 09/05/2022 1757   APPEARANCEUR CLEAR (A) 09/05/2022 1757   APPEARANCEUR Clear 10/18/2011 0958   LABSPEC 1.013 09/05/2022 1757   LABSPEC 1.021 10/18/2011 0958   PHURINE 7.0 09/05/2022 1757   GLUCOSEU 50 (A) 09/05/2022 1757   GLUCOSEU Negative 10/18/2011 0958   HGBUR SMALL (A) 09/05/2022 1757   BILIRUBINUR NEGATIVE 09/05/2022 1757   BILIRUBINUR Negative 10/18/2011 0958   KETONESUR NEGATIVE 09/05/2022 1757   PROTEINUR 100 (A) 09/05/2022 1757   NITRITE NEGATIVE 09/05/2022 1757   LEUKOCYTESUR NEGATIVE 09/05/2022 1757   LEUKOCYTESUR Negative 10/18/2011 0958   Sepsis  Labs: '@LABRCNTIP'$ (procalcitonin:4,lacticidven:4) Microbiology: Recent Results (from the past 240 hour(s))  Blood Culture (routine x 2)     Status: None (Preliminary result)   Collection Time: 09/05/22  5:58 PM   Specimen: BLOOD LEFT FOREARM  Result Value Ref Range Status   Specimen Description BLOOD LEFT FOREARM  Final   Special Requests   Final    BOTTLES DRAWN AEROBIC AND ANAEROBIC Blood Culture results may not be optimal due to an excessive volume of blood received in culture bottles   Culture   Final    NO GROWTH 3 DAYS Performed at [facility], [address], [place], [place] [address]    Report Status PENDING  Incomplete  Blood Culture (routine x 2)     Status: None (Preliminary result)   Collection Time: 09/05/22  5:58 PM   Specimen: BLOOD LEFT FOREARM  Result Value Ref Range Status   Specimen Description BLOOD LEFT FOREARM  Final   Special Requests   Final    BOTTLES DRAWN AEROBIC AND ANAEROBIC Blood Culture results may not be optimal due to an excessive volume of blood received in culture bottles   Culture   Final    NO GROWTH 3 DAYS Performed at [facility], [address] [place], [place]  [address]    Report Status PENDING  Incomplete  Resp panel by RT-PCR (RSV, Flu A&B, Covid) Anterior Nasal Swab     Status: None   Collection Time: 09/05/22  6:15 PM   Specimen: Anterior Nasal Swab  Result Value Ref Range Status   SARS Coronavirus 2 by RT PCR NEGATIVE NEGATIVE Final    Comment: (NOTE) SARS-CoV-2 target nucleic acids are NOT DETECTED.  The SARS-CoV-2 RNA is generally detectable in upper respiratory specimens during the acute phase of infection. The lowest concentration of SARS-CoV-2 viral copies this assay can detect is 138 copies/mL. A negative result does not preclude SARS-Cov-2 infection and should not be used as the sole basis for treatment or other patient management decisions. A negative result may occur with  improper specimen  collection/handling, submission of specimen other than nasopharyngeal swab, presence of viral mutation(s) within the areas targeted by this assay, and inadequate number of viral copies(<138 copies/mL). A negative result must be combined with clinical observations, patient history, and epidemiological information. The expected result is Negative.  Fact Sheet for Patients:  EntrepreneurPulse.com.au  Fact Sheet for Healthcare Providers:  IncredibleEmployment.be  This test is no t yet approved or cleared by the [place] FDA and  has been authorized for detection and/or diagnosis of SARS-CoV-2 by FDA under an Emergency Use Authorization (EUA). This EUA will remain  in effect (meaning this test can be used) for the duration of the COVID-19 declaration under Section 564(b)(1) of the Act, 21 U.S.C.section 360bbb-3(b)(1), unless the authorization is terminated  or revoked sooner.       Influenza A by PCR NEGATIVE NEGATIVE Final   Influenza B by PCR NEGATIVE NEGATIVE Final    Comment: (NOTE) The Xpert Xpress SARS-CoV-2/FLU/RSV plus assay is intended as an aid in the diagnosis of influenza from Nasopharyngeal swab specimens and should not be used as a sole basis for treatment. Nasal washings and aspirates are unacceptable for Xpert Xpress SARS-CoV-2/FLU/RSV testing.  Fact Sheet for Patients: EntrepreneurPulse.com.au  Fact Sheet for Healthcare Providers: IncredibleEmployment.be  This test is not yet approved or cleared by the [place] FDA and has been authorized for detection and/or diagnosis of SARS-CoV-2 by FDA under an Emergency Use Authorization (EUA). This EUA will remain in effect (meaning this test can be used) for the duration of the COVID-19 declaration under Section 564(b)(1) of the Act, 21 U.S.C. section 360bbb-3(b)(1), unless the authorization is terminated or revoked.     Resp Syncytial  Virus by PCR NEGATIVE NEGATIVE Final    Comment: (NOTE) Fact Sheet for Patients: EntrepreneurPulse.com.au  Fact Sheet for Healthcare Providers: IncredibleEmployment.be  This test is not yet approved or cleared by the [place] FDA and has been authorized for detection and/or diagnosis of SARS-CoV-2 by FDA under an Emergency Use Authorization (EUA). This EUA will remain in effect (meaning this test can be used) for the duration of the COVID-19 declaration under Section 564(b)(1) of the Act, 21 U.S.C. section 360bbb-3(b)(1), unless the authorization is terminated or revoked.  Performed at [facility], [address] [place], [place] [address]       Radiology Studies last 3 days: CT ABDOMEN PELVIS WO CONTRAST  Result Date: 09/05/2022 CLINICAL DATA:  77 year old male admitted on 09/05/2022 with intra-abdominal infection. Acute nonlocalized abdominal pain. EXAM: CT ABDOMEN AND PELVIS WITHOUT CONTRAST TECHNIQUE: Multidetector CT imaging of the abdomen and pelvis was performed following the standard protocol without IV contrast. RADIATION DOSE REDUCTION: This exam was performed according to the departmental dose-optimization  program which includes automated exposure control, adjustment of the mA and/or kV according to patient size and/or use of iterative reconstruction technique. COMPARISON:  None Available. FINDINGS: Lower chest: Respiratory motion obscures the lung bases. No acute abnormality. Hepatobiliary: The hepatic contour demonstrates mild nodularity suggestive of cirrhosis. The gallbladder is decompressed. Mild gallbladder wall thickening may be due to decompression and/or cirrhosis. No biliary dilation. Pancreas: Unremarkable. Spleen: Unremarkable. Adrenals/Urinary Tract: Normal adrenal glands. Bilateral cortical renal scarring. No urinary calculi or hydronephrosis. Nonspecific symmetric stranding about both kidneys. Unremarkable bladder.  Stomach/Bowel: Colonic diverticulosis without diverticulitis. Normal appendix. Normal caliber large and small bowel. Unremarkable stomach. Small hiatal hernia. Vascular/Lymphatic: Aortic atherosclerotic calcification. No enlarged abdominal or pelvic lymph nodes. Reproductive: Unremarkable. Other: Fat containing umbilical hernia. No free intraperitoneal fluid or air. Musculoskeletal: Bilateral total hip replacements. No acute osseous abnormality. Posterior fusion L2-L4. Thoracolumbar spondylosis. IMPRESSION: Nonspecific stranding about both kidneys is favored to be a normal finding though pyelonephritis is not excluded. Otherwise no evidence of infection in the abdomen or pelvis. Nodular contour of the liver suggestive of cirrhosis. Mild wall thickening of the gallbladder likely due to decompression and cirrhosis. Colonic diverticulosis without diverticulitis. Electronically Signed   By: Placido Sou M.D.   On: 09/05/2022 21:35   CT HEAD WO CONTRAST (5MM)  Result Date: 09/05/2022 CLINICAL DATA:  Confusion and weakness EXAM: CT HEAD WITHOUT CONTRAST TECHNIQUE: Contiguous axial images were obtained from the base of the skull through the vertex without intravenous contrast. RADIATION DOSE REDUCTION: This exam was performed according to the departmental dose-optimization program which includes automated exposure control, adjustment of the mA and/or kV according to patient size and/or use of iterative reconstruction technique. COMPARISON:  03/30/2022 FINDINGS: Brain: Chronic right occipital infarct is noted. Mild atrophic changes and chronic white matter ischemic changes are seen. No acute hemorrhage or acute infarction is seen. Vascular: No hyperdense vessel or unexpected calcification. Skull: Normal. Negative for fracture or focal lesion. Sinuses/Orbits: No acute finding. Other: None. IMPRESSION: Chronic changes without acute abnormality. Electronically Signed   By: Inez Catalina M.D.   On: 09/05/2022 19:14   DG  Chest Port 1 View  Result Date: 09/05/2022 CLINICAL DATA:  Questionable sepsis. EXAM: PORTABLE CHEST 1 VIEW COMPARISON:  Chest x-ray January 21, 2022 FINDINGS: Cardiomegaly. The hila and mediastinum are unchanged. No pneumothorax. No nodules or masses. No focal infiltrates or overt edema. IMPRESSION: No active disease. Electronically Signed   By: Dorise Bullion III M.D.   On: 09/05/2022 17:57      Time spent 50 min   LOS: 3 days      Richarda Osmond, DO [facility] 09/08/2022, 11:34 AM     Staff may message me via secure chat in [place]  but this may not receive an immediate response,  please page me for urgent matters!  If 7PM-7AM, please contact night coverage [email or web address]

## 2022-09-08 NOTE — Care Management Important Message (Signed)
Important Message  Patient Details  Name: TATEN MERROW MRN: [id number] Date of Birth: 07-21-1946   Medicare Important Message Given:  Yes  Patient asleep upon time of visit, no family in room.  Copy of Medicare IM left on windowsill for reference.  Spoke with Nilda Riggs, spouse, at [phone] and reviewed Medicare IM information.  States she will be coming to hospital shortly and will look for form.   Dannette Barbara 09/08/2022, 12:21 PM

## 2022-09-08 NOTE — TOC Progression Note (Signed)
Transition of Care [facility]) - Progression Note    Patient Details  Name: Daniel Kline MRN: [id number] Date of Birth: [birth year]  [facility]) CM/SW Contact  Beverly Sessions, RN Phone Number: 09/08/2022, 11:42 AM  Clinical Narrative:     Patient now listed as a level 3 mobility.  Message sent to MD to determine if patient is appropriate for PT eval        Expected Discharge Plan and Services                                               Social Determinants of Health (SDOH) Interventions SDOH Screenings   Food Insecurity: No Food Insecurity (09/05/2022)  Housing: Low Risk  (09/05/2022)  Transportation Needs: No Transportation Needs (09/05/2022)  Utilities: Not At Risk (09/05/2022)  Depression (PHQ2-9): Low Risk  (09/21/2021)  Financial Resource Strain: Low Risk  (07/20/2017)  Social Connections: Unknown (07/20/2017)  Tobacco Use: Medium Risk (09/05/2022)    Readmission Risk Interventions    09/06/2022    3:47 PM 01/27/2022   12:27 PM  Readmission Risk Prevention Plan  Transportation Screening Complete Complete  PCP or Specialist Appt within 3-5 Days  Complete  HRI or [place]  Complete  Social Work Consult for [place] Planning/Counseling  Complete  Palliative Care Screening  Complete  Medication Review [employer] photographer) Complete Complete  SW Recovery Care/Counseling Consult Complete   Palliative Care Screening Not Applicable
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
## 2022-09-09 ENCOUNTER — Other Ambulatory Visit: Payer: Self-pay | Admitting: Cardiovascular Disease

## 2022-09-09 DIAGNOSIS — I10 Essential (primary) hypertension: Secondary | ICD-10-CM

## 2022-09-09 DIAGNOSIS — R410 Disorientation, unspecified: Secondary | ICD-10-CM | POA: Diagnosis not present

## 2022-09-09 DIAGNOSIS — E1121 Type 2 diabetes mellitus with diabetic nephropathy: Secondary | ICD-10-CM

## 2022-09-09 DIAGNOSIS — A419 Sepsis, unspecified organism: Secondary | ICD-10-CM | POA: Diagnosis not present

## 2022-09-09 LAB — BASIC METABOLIC PANEL
Anion gap: 8 (ref 5–15)
BUN: 28 mg/dL — ABNORMAL HIGH (ref 8–23)
CO2: 20 mmol/L — ABNORMAL LOW (ref 22–32)
Calcium: 8.6 mg/dL — ABNORMAL LOW (ref 8.9–10.3)
Chloride: 108 mmol/L (ref 98–111)
Creatinine, Ser: 1.8 mg/dL — ABNORMAL HIGH (ref 0.61–1.24)
GFR, Estimated: 38 mL/min — ABNORMAL LOW (ref >60)
Glucose, Bld: 100 mg/dL — ABNORMAL HIGH (ref 70–99)
Potassium: 3.5 mmol/L (ref 3.5–5.1)
Sodium: 136 mmol/L (ref 135–145)

## 2022-09-09 LAB — GLUCOSE, CAPILLARY
Glucose-Capillary: 97 mg/dL (ref 70–99)
Glucose-Capillary: 216 mg/dL — ABNORMAL HIGH (ref 70–99)
Glucose-Capillary: 104 mg/dL — ABNORMAL HIGH (ref 70–99)
Glucose-Capillary: 129 mg/dL — ABNORMAL HIGH (ref 70–99)
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
## 2022-09-09 NOTE — NC FL2 (Signed)
[place] LEVEL OF CARE FORM     IDENTIFICATION  Patient Name: Daniel Kline Birthdate: 1945/10/04 Sex: male Admission Date (Current Location): 09/05/2022  [facility] and [place] Number:  Engineering geologist and Address:         Provider Number: [id number]  Attending Physician Name and Address:  Richarda Osmond, MD  Relative Name and Phone Number:       Current Level of Care: Hospital Recommended Level of Care: [place] Prior Approval Number:    Date Approved/Denied:   PASRR Number: pending  Discharge Plan: SNF    Current Diagnoses: Patient Active Problem List   Diagnosis Date Noted   Controlled type 2 diabetes mellitus with diabetic nephropathy ([place]) 09/09/2022   Confusion 09/08/2022   Lower extremity edema 09/08/2022   AKI (acute kidney injury) ([place]) 09/08/2022   Sepsis due to undetermined organism ([place]) 09/05/2022   Acute kidney injury superimposed on chronic kidney disease ([place]) 09/05/2022   Type 2 diabetes mellitus with chronic kidney disease, without long-term current use of insulin ([place]) 09/05/2022   Dyslipidemia 09/05/2022   Hyponatremia 09/05/2022   Cellulitis of left lower extremity 09/05/2022   Neutropenia ([place]) 02/03/2022   Paroxysmal atrial fibrillation ([place]) 01/23/2022   COVID-19 virus infection 01/21/2022   Mood disorder ([place]) 12/24/2021   Chronic renal disease, stage IV ([place]) 06/18/2021   Chronic pain 06/13/2020   Anemia in chronic kidney disease 04/02/2020   Benign hypertensive kidney disease with chronic kidney disease 04/02/2020   Proteinuria 04/02/2020   Renal mass 04/02/2020   Secondary hyperparathyroidism of renal origin ([place]) 04/02/2020   Preventative health care 03/10/2020   Total knee replacement status 01/28/2020   Encounter for long-term (current) use of high-risk medication 06/14/2019   Seronegative rheumatoid arthritis ([place]) 06/14/2019   Chronic narcotic dependence ([place]) 87/68/1157    Anti-cyclic citrullinated peptide antibody positive 05/03/2019   Synovitis of hand 05/03/2019   Radial neuropathy, left 09/06/2018   Type 2 diabetes mellitus with diabetic nephropathy ([place]) 06/22/2018   Chronic bilateral low back pain without sciatica 06/09/2018   Knee pain, chronic 04/12/2018   OSA (obstructive sleep apnea) 09/07/2017   History of poliomyelitis 08/27/2017   Localized edema 08/12/2017   Actinic keratosis 07/26/2017   History of total hip arthroplasty, bilateral 06/09/2016   GERD (gastroesophageal reflux disease) 04/21/2016   PONV (postoperative nausea and vomiting) 04/21/2016   Skin cancer 04/21/2016   Asymptomatic gallstones 01/06/2016   Venous stasis dermatitis of both lower extremities 10/29/2015   Stage 3b chronic kidney disease ([place]) 09/27/2015   Morbid obesity ([place]) 09/27/2015   Primary osteoarthritis of right knee 08/26/2015   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   Hypercholesterolemia 12/20/2011   Infection and inflammatory reaction due to internal prosthetic device, implant, and graft 12/20/2011   Essential hypertension 01/12/2010    Orientation RESPIRATION BLADDER Height & Weight     Self  Normal Incontinent Weight: 108.8 kg Height:  '5\' 6"'$  (167.6 cm)  BEHAVIORAL SYMPTOMS/MOOD NEUROLOGICAL BOWEL NUTRITION STATUS      Continent Diet (Heart Healthy)  AMBULATORY STATUS COMMUNICATION OF NEEDS Skin   Limited Assist Verbally Bruising                       Personal Care Assistance Level of Assistance              Functional Limitations Info             SPECIAL CARE FACTORS FREQUENCY  PT (By licensed PT), OT (By licensed OT)                    Contractures Contractures Info: Not present    Additional Factors Info  Code Status, Allergies Code Status Info: Full Allergies Info: Bee Venom, Oxycodone, Hydromorphone, Hydroxychloroquine, Zolpidem           Current Medications (09/09/2022):  This is the current hospital active  medication list Current Facility-Administered Medications  Medication Dose Route Frequency Provider Last Rate Last Admin   acetaminophen (TYLENOL) tablet 650 mg  650 mg Oral Q6H PRN Mansy, Jan A, MD   650 mg at 09/08/22 2119   Or   acetaminophen (TYLENOL) suppository 650 mg  650 mg Rectal Q6H PRN Mansy, Jan A, MD       amLODipine (NORVASC) tablet 5 mg  5 mg Oral Daily Mansy, Jan A, MD   5 mg at 09/09/22 1041   carvedilol (COREG) tablet 3.125 mg  3.125 mg Oral BID WC Mansy, Jan A, MD   3.125 mg at 09/09/22 1042   cefTRIAXone (ROCEPHIN) 2 g in sodium chloride 0.9 % 100 mL IVPB  2 g Intravenous Q24H Emeterio Reeve, DO 200 mL/hr at 09/09/22 1047 2 g at 09/09/22 1047   cholecalciferol (VITAMIN D3) tablet 10,000 Units  10,000 Units Oral Daily Mansy, Jan A, MD   10,000 Units at 09/09/22 1041   clobetasol cream (TEMOVATE) 4.27 % 1 Application  1 Application Topical BID PRN Mansy, Jan A, MD       clorazepate (TRANXENE) tablet 7.5 mg  7.5 mg Oral BID PRN Mansy, Jan A, MD       EPINEPHrine (EPI-PEN) injection 0.3 mg  0.3 mg Intramuscular PRN Mansy, Jan A, MD       ferrous sulfate tablet 325 mg  325 mg Oral QHS Mansy, Jan A, MD   325 mg at 01/30/75 2831   folic acid (FOLVITE) tablet 1 mg  1 mg Oral Daily Mansy, Jan A, MD   1 mg at 09/09/22 1041   glipiZIDE (GLUCOTROL) tablet 2.5 mg  2.5 mg Oral BID AC Mansy, Jan A, MD   2.5 mg at 09/09/22 1041   heparin injection 5,000 Units  5,000 Units Subcutaneous Q8H Emeterio Reeve, DO   5,000 Units at 09/09/22 1253   HYDROcodone-acetaminophen (NORCO/VICODIN) 5-325 MG per tablet 1 tablet  1 tablet Oral Q6H PRN Mansy, Jan A, MD   1 tablet at 09/07/22 1246   hydrOXYzine (ATARAX) tablet 25 mg  25 mg Oral TID PRN Mansy, Jan A, MD   25 mg at 09/06/22 2100   insulin aspart (novoLOG) injection 0-15 Units  0-15 Units Subcutaneous TID WC Mansy, Jan A, MD   5 Units at 09/09/22 1250   isosorbide mononitrate (IMDUR) 24 hr tablet 60 mg  60 mg Oral QAC lunch Mansy, Jan A, MD    60 mg at 09/09/22 1250   ketoconazole (NIZORAL) 2 % cream 1 Application  1 Application Topical Daily PRN Mansy, Jan A, MD       losartan (COZAAR) tablet 100 mg  100 mg Oral Daily Doristine Mango L, MD   100 mg at 09/09/22 1041   multivitamin with minerals tablet 1 tablet  1 tablet Oral Daily Mansy, Jan A, MD   1 tablet at 09/09/22 1041   mupirocin ointment (BACTROBAN) 2 % 1 Application  1 Application Topical Daily Mansy, Jan A, MD   1 Application at 51/76/16 1042   ondansetron [facility])  tablet 4 mg  4 mg Oral Q6H PRN Mansy, Jan A, MD       Or   ondansetron [facility]) injection 4 mg  4 mg Intravenous Q6H PRN Mansy, Jan A, MD       rosuvastatin (CRESTOR) tablet 20 mg  20 mg Oral Daily Mansy, Jan A, MD   20 mg at 09/09/22 1042   timolol (TIMOPTIC) 0.5 % ophthalmic solution 1 drop  1 drop Both Eyes BID Mansy, Jan A, MD   1 drop at 09/09/22 1042   traZODone (DESYREL) tablet 25 mg  25 mg Oral QHS PRN Mansy, Jan A, MD   25 mg at 09/06/22 2100     Discharge Medications: Please see discharge summary for a list of discharge medications.  Relevant Imaging Results:  Relevant Lab Results:   Additional Information ss [id number]  Beverly Sessions, RN

## 2022-09-09 NOTE — Progress Notes (Signed)
RE: Daniel Kline Date of Birth:  2045/08/14 Date: 09/09/22     To Whom It May Concern:   Please be advised that the above-named patient will require a short-term nursing home stay - anticipated 30 days or less for rehabilitation and strengthening.  The plan is for return home

## 2022-09-09 NOTE — TOC Progression Note (Signed)
Transition of Care [facility]) - Progression Note    Patient Details  Name: Daniel Kline MRN: [id number] Date of Birth: Feb 04, 1946  Transition of Care [facility]) CM/SW Contact  Beverly Sessions, RN Phone Number: 09/09/2022, 3:13 PM  Clinical Narrative:     Clinical uploaded to [facility] for passr       Expected Discharge Plan and Services                                               Social Determinants of Health (SDOH) Interventions SDOH Screenings   Food Insecurity: No Food Insecurity (09/05/2022)  Housing: Low Risk  (09/05/2022)  Transportation Needs: No Transportation Needs (09/05/2022)  Utilities: Not At Risk (09/05/2022)  Depression (PHQ2-9): Low Risk  (09/21/2021)  Financial Resource Strain: Low Risk  (07/20/2017)  Social Connections: Unknown (07/20/2017)  Tobacco Use: Medium Risk (09/05/2022)    Readmission Risk Interventions    09/06/2022    3:47 PM 01/27/2022   12:27 PM  Readmission Risk Prevention Plan  Transportation Screening Complete Complete  PCP or Specialist Appt within 3-5 Days  Complete  HRI or [place]  Complete  Social Work Consult for [place] Planning/Counseling  Complete  Palliative Care Screening  Complete  Medication Review Press photographer) Complete Complete  SW Recovery Care/Counseling Consult Complete   Palliative Care Screening Not Applicable

## 2022-09-09 NOTE — TOC Progression Note (Signed)
Transition of Care [facility]) - Progression Note    Patient Details  Name: KHALEB BROZ MRN: [id number] Date of Birth: [birth year]  Transition of [facility]) CM/SW Contact  Beverly Sessions, RN Phone Number: 09/09/2022, 2:37 PM  Clinical Narrative:     Therapy recommending SNF Patient A& x1 today Called wife [name].  She is in agreement for bed search  PASSR went to level 2 Fl2 sent for signature Bed search initiated - [name]'s first choice is [facility].  Message sent to [facility] at [facility] to request review        Expected Discharge Plan and Services                                               Social Determinants of Health (SDOH) Interventions SDOH Screenings   Food Insecurity: No Food Insecurity (09/05/2022)  Housing: Low Risk  (09/05/2022)  Transportation Needs: No Transportation Needs (09/05/2022)  Utilities: Not At Risk (09/05/2022)  Depression (PHQ2-9): Low Risk  (09/21/2021)  Financial Resource Strain: Low Risk  (07/20/2017)  Social Connections: Unknown (07/20/2017)  Tobacco Use: Medium Risk (09/05/2022)    Readmission Risk Interventions    09/06/2022    3:47 PM 01/27/2022   12:27 PM  Readmission Risk Prevention Plan  Transportation Screening Complete Complete  PCP or Specialist Appt within 3-5 Days  Complete  HRI or [place]  Complete  Social Work Consult for [place] Planning/Counseling  Complete  Palliative Care Screening  Complete  Medication Review Press photographer) Complete Complete  SW Recovery Care/Counseling Consult Complete   Palliative Care Screening Not Applicable

## 2022-09-09 NOTE — Progress Notes (Signed)
Mobility Specialist - Progress Note   09/09/22 1015  Mobility  Activity Transferred to/from [facility]  Level of Assistance Minimal assist, patient does 75% or more  Assistive Device Front wheel walker  Distance Ambulated (ft) 6 ft  Activity Response Tolerated well  $Mobility charge 1 Mobility   MS responding to Pt calling out. Pt sitting in recliner upon entry, utilizing RA. Pt STS to RW MInA, initial wide base of stance, Min VC for correction. Pt amb to the BSC, tolerated well. Pt left sitting on BSC with instructions to call out when ready.   [place]ie [place] Mobility Specialist 09/09/22 10:39 AM

## 2022-09-09 NOTE — Progress Notes (Signed)
Mobility Specialist - Progress Note   09/09/22 1039  Mobility  Activity Transferred to/from [facility];Ambulated with assistance in room  Level of Assistance Standby assist, set-up cues, supervision of patient - no hands on  Assistive Device Front wheel walker  Distance Ambulated (ft) 6 ft  Activity Response Tolerated well  $Mobility charge 1 Mobility   Pt sitting on BSC upon entry, utilizing RA. Pt amb from [facility] to recliner with RW, tolerated well. Pt left sitting in recliner with alarm set and needs within reach.   Candie Mile Mobility Specialist 09/09/22 10:40 AM

## 2022-09-10 DIAGNOSIS — A419 Sepsis, unspecified organism: Secondary | ICD-10-CM | POA: Diagnosis not present

## 2022-09-10 LAB — CBC
HCT: 29.8 % — ABNORMAL LOW (ref 39.0–52.0)
Hemoglobin: 9.9 g/dL — ABNORMAL LOW (ref 13.0–17.0)
MCH: 30.7 pg (ref 26.0–34.0)
MCHC: 33.2 g/dL (ref 30.0–36.0)
MCV: 92.5 fL (ref 80.0–100.0)
Platelets: 246 10*3/uL (ref 150–400)
RBC: 3.22 MIL/uL — ABNORMAL LOW (ref 4.22–5.81)
RDW: 16.9 % — ABNORMAL HIGH (ref 11.5–15.5)
WBC: 11.6 10*3/uL — ABNORMAL HIGH (ref 4.0–10.5)
nRBC: 0.2 % (ref 0.0–0.2)

## 2022-09-10 LAB — BASIC METABOLIC PANEL
Anion gap: 6 (ref 5–15)
BUN: 31 mg/dL — ABNORMAL HIGH (ref 8–23)
CO2: 23 mmol/L (ref 22–32)
Calcium: 8.5 mg/dL — ABNORMAL LOW (ref 8.9–10.3)
Chloride: 106 mmol/L (ref 98–111)
Creatinine, Ser: 1.95 mg/dL — ABNORMAL HIGH (ref 0.61–1.24)
GFR, Estimated: 35 mL/min — ABNORMAL LOW (ref >60)
Glucose, Bld: 130 mg/dL — ABNORMAL HIGH (ref 70–99)
Potassium: 3.7 mmol/L (ref 3.5–5.1)
Sodium: 135 mmol/L (ref 135–145)

## 2022-09-10 LAB — GLUCOSE, CAPILLARY
Glucose-Capillary: 134 mg/dL — ABNORMAL HIGH (ref 70–99)
Glucose-Capillary: 113 mg/dL — ABNORMAL HIGH (ref 70–99)
Glucose-Capillary: 255 mg/dL — ABNORMAL HIGH (ref 70–99)
Glucose-Capillary: 173 mg/dL — ABNORMAL HIGH (ref 70–99)

## 2022-09-10 LAB — CULTURE, BLOOD (ROUTINE X 2)
Culture: NO GROWTH
Culture: NO GROWTH

## 2022-09-10 MED ORDER — VITAMIN D 25 MCG (1000 UNIT) PO TABS
1000.0000 [IU] | ORAL_TABLET | Freq: Every day | ORAL | Status: DC
  Administered 2022-09-11: 1000 [IU] via ORAL
  Filled 2022-09-10: qty 1

## 2022-09-10 NOTE — Care Management Important Message (Signed)
Important Message  Patient Details  Name: Daniel Kline MRN: [id number] Date of Birth: 1946/01/12   Medicare Important Message Given:  Yes     Dannette Barbara 09/10/2022, 11:35 AM

## 2022-09-10 NOTE — TOC Progression Note (Addendum)
Transition of Care [facility]) - Progression Note    Patient Details  Name: Daniel Kline MRN: [id number] Date of Birth: [birth year]  Transition of Care [facility]) CM/SW [place], LCSW Phone Number: 09/10/2022, 8:26 AM  Clinical Narrative:    Asked [name] with [facility] to review referral.   10:46- [name] with [facility] states they are unable to take patient.  Called patient's spouse [name]. Presented bed offers at [facility], [facility], and [facility]. Provided Medicare.gov ratings.  She chose [facility]. Per MD, patient may be medically ready tomorrow 2/3. CSW started insurance auth and updated [name] at [facility]. [name] confirms patient can come 2/3 if medically ready and [name] is approved.         Expected Discharge Plan and Services                                               Social Determinants of Health (SDOH) Interventions SDOH Screenings   Food Insecurity: No Food Insecurity (09/05/2022)  Housing: Low Risk  (09/05/2022)  Transportation Needs: No Transportation Needs (09/05/2022)  Utilities: Not At Risk (09/05/2022)  Depression (PHQ2-9): Low Risk  (09/21/2021)  Financial Resource Strain: Low Risk  (07/20/2017)  Social Connections: Unknown (07/20/2017)  Tobacco Use: Medium Risk (09/05/2022)    Readmission Risk Interventions    09/06/2022    3:47 PM 01/27/2022   12:27 PM  Readmission Risk Prevention Plan  Transportation Screening Complete Complete  PCP or Specialist Appt within 3-5 Days  Complete  HRI or [place]  Complete  Social Work Consult for [place] Planning/Counseling  Complete  Palliative Care Screening  Complete  Medication Review Press photographer) Complete Complete  SW Recovery Care/Counseling Consult Complete   Palliative Care Screening Not Applicable

## 2022-09-10 NOTE — Plan of Care (Signed)
Patient alert to self only, baseline forgetfulness and confusion.  All meds given on time as ordered.  Pt c/o pain relieved by PRN pain meds.  Diminished lungs, IS encouraged.  VSS throughout shift.  POC maintained, will continue to monitor.  Problem: Fluid Volume: Goal: Hemodynamic stability will improve Outcome: Progressing   Problem: Clinical Measurements: Goal: Diagnostic test results will improve Outcome: Progressing Goal: Signs and symptoms of infection will decrease Outcome: Progressing   Problem: Respiratory: Goal: Ability to maintain adequate ventilation will improve Outcome: Progressing   Problem: Education: Goal: Knowledge of General Education information will improve Description: Including pain rating scale, medication(s)/side effects and non-pharmacologic comfort measures Outcome: Progressing   Problem: Health Behavior/Discharge Planning: Goal: Ability to manage health-related needs will improve Outcome: Progressing   Problem: Clinical Measurements: Goal: Ability to maintain clinical measurements within normal limits will improve Outcome: Progressing Goal: Will remain free from infection Outcome: Progressing Goal: Diagnostic test results will improve Outcome: Progressing Goal: Respiratory complications will improve Outcome: Progressing Goal: Cardiovascular complication will be avoided Outcome: Progressing   Problem: Activity: Goal: Risk for activity intolerance will decrease Outcome: Progressing   Problem: Nutrition: Goal: Adequate nutrition will be maintained Outcome: Progressing   Problem: Coping: Goal: Level of anxiety will decrease Outcome: Progressing   Problem: Elimination: Goal: Will not experience complications related to bowel motility Outcome: Progressing Goal: Will not experience complications related to urinary retention Outcome: Progressing   Problem: Pain Managment: Goal: General experience of comfort will improve Outcome:  Progressing   Problem: Safety: Goal: Ability to remain free from injury will improve Outcome: Progressing   Problem: Skin Integrity: Goal: Risk for impaired skin integrity will decrease Outcome: Progressing   Problem: Education: Goal: Ability to describe self-care measures that may prevent or decrease complications (Diabetes Survival Skills Education) will improve Outcome: Progressing Goal: Individualized Educational Video(s) Outcome: Progressing   Problem: Coping: Goal: Ability to adjust to condition or change in health will improve Outcome: Progressing   Problem: Fluid Volume: Goal: Ability to maintain a balanced intake and output will improve Outcome: Progressing   Problem: Health Behavior/Discharge Planning: Goal: Ability to identify and utilize available resources and services will improve Outcome: Progressing Goal: Ability to manage health-related needs will improve Outcome: Progressing   Problem: Metabolic: Goal: Ability to maintain appropriate glucose levels will improve Outcome: Progressing   Problem: Nutritional: Goal: Maintenance of adequate nutrition will improve Outcome: Progressing Goal: Progress toward achieving an optimal weight will improve Outcome: Progressing   Problem: Skin Integrity: Goal: Risk for impaired skin integrity will decrease Outcome: Progressing   Problem: Tissue Perfusion: Goal: Adequacy of tissue perfusion will improve Outcome: Progressing

## 2022-09-10 NOTE — Progress Notes (Addendum)
  Progress Note   Patient: Daniel Kline MPN:[id number] DOB: 1946/06/27 DOA: 09/05/2022     5 DOS: the patient was seen and examined on 09/10/2022   Brief hospital course:   Assessment and Plan: * Severe sepsis due to undetermined organism ([facility]) - IV ceftriaxone 2 g daily   Cellulitis of left lower extremity - Antibx as above  - Norco/vicodin 5-325 1 tab PO q6 hr PRN   Acute kidney injury superimposed on chronic kidney disease (HCC) - Monitor   Hyponatremia - Resolved   Essential hypertension - Norvasc 5 mg PO daily  - Coreg 3.125 mg PO bid  - Imdur 60 mg PO daily  - Losartan 100 mg PO daily   Dyslipidemia - Crestor 20 mg PO daily   Type 2 diabetes mellitus with chronic kidney disease, without long-term current use of insulin (HCC) - Novolog SS tid w/ meals   DVT prophylaxis: Heparin 5000 units sq q8hr       Subjective: Pt seen and examined at the bedside. WBC has down trended 20 --> 11.   He continues on IV antibx's. Blood cx's remain negative.   Physical Exam: Vitals:   09/09/22 2046 09/10/22 0034 09/10/22 0500 09/10/22 0753  BP: (!) 164/79 129/86 (!) 157/78 (!) 143/76  Pulse: 73 81 84 (!) 52  Resp: '20 20 16 18  '$ Temp: 98.3 F (36.8 C) 98.3 F (36.8 C) 98 F (36.7 C) 98.2 F (36.8 C)  TempSrc: Oral Oral Oral   SpO2: 99% 97% 97% 98%  Weight:      Height:       Physical Exam Constitutional:      Appearance: Normal appearance.  HENT:     Head: Normocephalic and atraumatic.     Mouth/Throat:     Mouth: Mucous membranes are moist.  Cardiovascular:     Rate and Rhythm: Normal rate and regular rhythm.  Pulmonary:     Effort: Pulmonary effort is normal.     Breath sounds: Normal breath sounds.  Abdominal:     Palpations: Abdomen is soft.     Comments: Obese   Musculoskeletal:        General: Normal range of motion.     Cervical back: Neck supple.  Skin:    General: Skin is warm.  Neurological:     Mental Status: He is alert. Mental status is at  baseline.  Psychiatric:        Mood and Affect: Mood normal.    Data Reviewed:   Disposition: Status is: Inpatient  Planned Discharge Destination: Skilled nursing facility    Time spent: 35 minutes  Author: Lucienne Minks , MD 09/10/2022 8:51 AM  For on call review [email or web address].

## 2022-09-11 DIAGNOSIS — N179 Acute kidney failure, unspecified: Secondary | ICD-10-CM | POA: Diagnosis not present

## 2022-09-11 DIAGNOSIS — R600 Localized edema: Secondary | ICD-10-CM | POA: Diagnosis not present

## 2022-09-11 DIAGNOSIS — M0600 Rheumatoid arthritis without rheumatoid factor, unspecified site: Secondary | ICD-10-CM | POA: Diagnosis not present

## 2022-09-11 DIAGNOSIS — R0602 Shortness of breath: Secondary | ICD-10-CM | POA: Diagnosis not present

## 2022-09-11 DIAGNOSIS — R262 Difficulty in walking, not elsewhere classified: Secondary | ICD-10-CM | POA: Diagnosis not present

## 2022-09-11 DIAGNOSIS — E1121 Type 2 diabetes mellitus with diabetic nephropathy: Secondary | ICD-10-CM | POA: Diagnosis not present

## 2022-09-11 DIAGNOSIS — E611 Iron deficiency: Secondary | ICD-10-CM | POA: Diagnosis not present

## 2022-09-11 DIAGNOSIS — I10 Essential (primary) hypertension: Secondary | ICD-10-CM | POA: Diagnosis not present

## 2022-09-11 DIAGNOSIS — L03119 Cellulitis of unspecified part of limb: Secondary | ICD-10-CM | POA: Diagnosis not present

## 2022-09-11 DIAGNOSIS — M05611 Rheumatoid arthritis of right shoulder with involvement of other organs and systems: Secondary | ICD-10-CM | POA: Diagnosis not present

## 2022-09-11 DIAGNOSIS — E569 Vitamin deficiency, unspecified: Secondary | ICD-10-CM | POA: Diagnosis not present

## 2022-09-11 DIAGNOSIS — R609 Edema, unspecified: Secondary | ICD-10-CM | POA: Diagnosis not present

## 2022-09-11 DIAGNOSIS — M6281 Muscle weakness (generalized): Secondary | ICD-10-CM | POA: Diagnosis not present

## 2022-09-11 DIAGNOSIS — R279 Unspecified lack of coordination: Secondary | ICD-10-CM | POA: Diagnosis not present

## 2022-09-11 DIAGNOSIS — N1831 Chronic kidney disease, stage 3a: Secondary | ICD-10-CM | POA: Diagnosis not present

## 2022-09-11 DIAGNOSIS — R5381 Other malaise: Secondary | ICD-10-CM | POA: Diagnosis not present

## 2022-09-11 DIAGNOSIS — N189 Chronic kidney disease, unspecified: Secondary | ICD-10-CM | POA: Diagnosis not present

## 2022-09-11 DIAGNOSIS — A419 Sepsis, unspecified organism: Secondary | ICD-10-CM | POA: Diagnosis not present

## 2022-09-11 DIAGNOSIS — M6259 Muscle wasting and atrophy, not elsewhere classified, multiple sites: Secondary | ICD-10-CM | POA: Diagnosis not present

## 2022-09-11 DIAGNOSIS — E0840 Diabetes mellitus due to underlying condition with diabetic neuropathy, unspecified: Secondary | ICD-10-CM | POA: Diagnosis not present

## 2022-09-11 DIAGNOSIS — E1122 Type 2 diabetes mellitus with diabetic chronic kidney disease: Secondary | ICD-10-CM | POA: Diagnosis not present

## 2022-09-11 DIAGNOSIS — E785 Hyperlipidemia, unspecified: Secondary | ICD-10-CM | POA: Diagnosis not present

## 2022-09-11 DIAGNOSIS — L03116 Cellulitis of left lower limb: Secondary | ICD-10-CM | POA: Diagnosis not present

## 2022-09-11 DIAGNOSIS — R21 Rash and other nonspecific skin eruption: Secondary | ICD-10-CM | POA: Diagnosis not present

## 2022-09-11 DIAGNOSIS — Z741 Need for assistance with personal care: Secondary | ICD-10-CM | POA: Diagnosis not present

## 2022-09-11 LAB — COMPREHENSIVE METABOLIC PANEL
ALT: 50 U/L — ABNORMAL HIGH (ref 0–44)
AST: 53 U/L — ABNORMAL HIGH (ref 15–41)
Albumin: 2.5 g/dL — ABNORMAL LOW (ref 3.5–5.0)
Alkaline Phosphatase: 64 U/L (ref 38–126)
Anion gap: 6 (ref 5–15)
BUN: 33 mg/dL — ABNORMAL HIGH (ref 8–23)
CO2: 22 mmol/L (ref 22–32)
Calcium: 8 mg/dL — ABNORMAL LOW (ref 8.9–10.3)
Chloride: 104 mmol/L (ref 98–111)
Creatinine, Ser: 1.78 mg/dL — ABNORMAL HIGH (ref 0.61–1.24)
GFR, Estimated: 39 mL/min — ABNORMAL LOW (ref >60)
Glucose, Bld: 120 mg/dL — ABNORMAL HIGH (ref 70–99)
Potassium: 3.8 mmol/L (ref 3.5–5.1)
Sodium: 132 mmol/L — ABNORMAL LOW (ref 135–145)
Total Bilirubin: 0.3 mg/dL (ref 0.3–1.2)
Total Protein: 5.9 g/dL — ABNORMAL LOW (ref 6.5–8.1)

## 2022-09-11 LAB — CBC
HCT: 28.4 % — ABNORMAL LOW (ref 39.0–52.0)
Hemoglobin: 9.3 g/dL — ABNORMAL LOW (ref 13.0–17.0)
MCH: 30.4 pg (ref 26.0–34.0)
MCHC: 32.7 g/dL (ref 30.0–36.0)
MCV: 92.8 fL (ref 80.0–100.0)
Platelets: 265 10*3/uL (ref 150–400)
RBC: 3.06 MIL/uL — ABNORMAL LOW (ref 4.22–5.81)
RDW: 16.9 % — ABNORMAL HIGH (ref 11.5–15.5)
WBC: 11.1 10*3/uL — ABNORMAL HIGH (ref 4.0–10.5)
nRBC: 0.2 % (ref 0.0–0.2)

## 2022-09-11 LAB — C-REACTIVE PROTEIN: CRP: 4.9 mg/dL — ABNORMAL HIGH (ref <1.0)

## 2022-09-11 LAB — GLUCOSE, CAPILLARY
Glucose-Capillary: 132 mg/dL — ABNORMAL HIGH (ref 70–99)
Glucose-Capillary: 202 mg/dL — ABNORMAL HIGH (ref 70–99)

## 2022-09-11 LAB — MAGNESIUM: Magnesium: 2.5 mg/dL — ABNORMAL HIGH (ref 1.7–2.4)

## 2022-09-11 MED ORDER — CEFDINIR 300 MG PO CAPS: 300.0000 mg | ORAL_CAPSULE | Freq: Two times a day (BID) | ORAL | 0 refills | Status: AC

## 2022-09-11 MED ORDER — FUROSEMIDE 40 MG PO TABS: 40.0000 mg | ORAL_TABLET | Freq: Every day | ORAL | 3 refills | Status: DC
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
## 2022-09-11 NOTE — TOC Transition Note (Addendum)
Transition of Care [facility]) - CM/SW Discharge Note   Patient Details  Name: Daniel Kline MRN: [id number] Date of Birth: 1945/12/31  [facility]) CM/SW Contact:  Magnus Ivan, LCSW Phone Number: 09/11/2022, 10:46 AM   Clinical Narrative:    Discharge to [facility] in [place] today. Room 611B. Confirmed with Admissions Worker [name] Updated MD, RN, and patient's wife. Confirmed with PT that they feel patient is safe to transport by private vehicle. Wife agreed to transport and will bring patients walker. MD and RN updated about transport plan, RN to call wife when patient is ready for pick up.  Asked RN to call report. EMS paperwork completed.    Final next level of care: Skilled Nursing Facility Barriers to Discharge: Barriers Resolved   Patient Goals and CMS Choice CMS Medicare.gov Compare Post Acute Care list provided to:: Patient Represenative (must comment) Choice offered to / list presented to : Spouse  Discharge Placement                Patient chooses bed at: [facility] Patient to be transferred to facility by: spouse - [name] Name of family member notified: [name] Patient and family notified of of transfer: 09/11/22  Discharge Plan and Services Additional resources added to the After Visit Summary for                                       Social Determinants of Health (SDOH) Interventions SDOH Screenings   Food Insecurity: No Food Insecurity (09/05/2022)  Housing: Low Risk  (09/05/2022)  Transportation Needs: No Transportation Needs (09/05/2022)  Utilities: Not At Risk (09/05/2022)  Depression (PHQ2-9): Low Risk  (09/21/2021)  Financial Resource Strain: Low Risk  (07/20/2017)  Social Connections: Unknown (07/20/2017)  Tobacco Use: Medium Risk (09/05/2022)     Readmission Risk Interventions    09/06/2022    3:47 PM 01/27/2022   12:27 PM  Readmission Risk Prevention Plan  Transportation Screening Complete Complete   PCP or Specialist Appt within 3-5 Days  Complete  HRI or [place]  Complete  Social Work Consult for [place] Planning/Counseling  Complete  Palliative Care Screening  Complete  Medication Review Press photographer) Complete Complete  SW Recovery Care/Counseling Consult Complete   Palliative Care Screening Not Applicable

## 2022-09-11 NOTE — TOC Progression Note (Addendum)
Transition of Care [facility]) - Progression Note    Patient Details  Name: Daniel Kline MRN: [id number] Date of Birth: 05/06/1946  Transition [facility]) CM/SW [place], LCSW Phone Number: 09/11/2022, 8:16 AM  Clinical Narrative:    Patient's insurance [name] has been approved for SNF. CSW asked MD and RN if patient is medically ready. Will continue to follow.  8:33- Per MD patient is medically ready, asked [name] at [facility] for room #.   9:22- Reached out to [name] again.  10:06- Call to [name] again.       Expected Discharge Plan and Services                                               Social Determinants of Health (SDOH) Interventions SDOH Screenings   Food Insecurity: No Food Insecurity (09/05/2022)  Housing: Low Risk  (09/05/2022)  Transportation Needs: No Transportation Needs (09/05/2022)  Utilities: Not At Risk (09/05/2022)  Depression (PHQ2-9): Low Risk  (09/21/2021)  Financial Resource Strain: Low Risk  (07/20/2017)  Social Connections: Unknown (07/20/2017)  Tobacco Use: Medium Risk (09/05/2022)    Readmission Risk Interventions    09/06/2022    3:47 PM 01/27/2022   12:27 PM  Readmission Risk Prevention Plan  Transportation Screening Complete Complete  PCP or Specialist Appt within 3-5 Days  Complete  HRI or [place]  Complete  Social Work Consult for [place] Planning/Counseling  Complete  Palliative Care Screening  Complete  Medication Review Press photographer) Complete Complete  SW Recovery Care/Counseling Consult Complete   Palliative Care Screening Not Applicable

## 2022-09-11 NOTE — Progress Notes (Signed)
Physical Therapy Treatment Patient Details Name: Daniel Kline MRN: [id number] DOB: 1946-03-15 Today's Date: 09/11/2022   History of Present Illness Pt is a 77 y.o. male presenting to hospital 09/05/22 with c/o weakness, fever, and confusion.  Pt admitted with sepsis, cellulitis of L LE, AKI superimposed on CKD, and hyponatremia.  PMH includes anemia, atrial flutter, DM, htn, HLD, polio with R arm deficits, CKD, OSA, back sx, R TKA 2021, R elbow sx, R shoulder sx, B THA 2017.    PT Comments    The pt presents this session in great spirits. He reports feeling much better today and is impressed with his progress. During this session the pt demonstrates improved independence with bed mobility and gait. He tolerates ambulating to bedside chair with close supervision and use of RW. He also requires Min A for UE dressing.The pt continues to require skilled PT in order to optimize functional mobility. Current plan remains appropriate.   Recommendations for follow up therapy are one component of a multi-disciplinary discharge planning process, led by the attending physician.  Recommendations may be updated based on patient status, additional functional criteria and insurance authorization.  Follow Up Recommendations  Skilled nursing-short term rehab (<3 hours/day) Can patient physically be transported by private vehicle: Yes   Assistance Recommended at Discharge Intermittent Supervision/Assistance  Patient can return home with the following A little help with walking and/or transfers;A little help with bathing/dressing/bathroom;Help with stairs or ramp for entrance   Equipment Recommendations  Rolling walker (2 wheels);BSC/3in1    Recommendations for Other Services       Precautions / Restrictions Precautions Precautions: Fall Restrictions Weight Bearing Restrictions: No     Mobility  Bed Mobility Overal bed mobility: Needs Assistance Bed Mobility: Supine to Sit     Supine to sit: Min  assist, HOB elevated          Transfers Overall transfer level: Needs assistance Equipment used: Rolling walker (2 wheels) Transfers: Sit to/from Stand Sit to Stand: Mod assist                Ambulation/Gait Ambulation/Gait assistance: Min assist Gait Distance (Feet): 10 Feet Assistive device: Rolling walker (2 wheels)   Gait velocity: decreased         Stairs             Wheelchair Mobility    Modified Rankin (Stroke Patients Only)       Balance Overall balance assessment: Needs assistance   Sitting balance-Leahy Scale: Good       Standing balance-Leahy Scale: Fair                              Cognition Arousal/Alertness: Awake/alert Behavior During Therapy: WFL for tasks assessed/performed Overall Cognitive Status: No family/caregiver present to determine baseline cognitive functioning                         Following Commands: Follows one step commands consistently                Exercises      General Comments        Pertinent Vitals/Pain Pain Assessment Pain Assessment: No/denies pain    Home Living                          Prior Function  PT Goals (current goals can now be found in the care plan section) Acute Rehab PT Goals Patient Stated Goal: to improve mobility PT Goal Formulation: With patient Time For Goal Achievement: 09/23/22 Potential to Achieve Goals: Good Progress towards PT goals: Progressing toward goals    Frequency    Min 2X/week      PT Plan Current plan remains appropriate    Co-evaluation              AM-PAC PT "6 Clicks" Mobility   Outcome Measure  Help needed turning from your back to your side while in a flat bed without using bedrails?: A Little Help needed moving from lying on your back to sitting on the side of a flat bed without using bedrails?: A Lot Help needed moving to and from a bed to a chair (including a wheelchair)?: A  Little Help needed standing up from a chair using your arms (e.g., wheelchair or bedside chair)?: A Lot Help needed to walk in hospital room?: A Little Help needed climbing 3-5 steps with a railing? : A Lot 6 Click Score: 15    End of Session   Activity Tolerance: Patient tolerated treatment well Patient left: in chair;with call bell/phone within reach;with chair alarm set Nurse Communication: Mobility status PT Visit Diagnosis: Unsteadiness on feet (R26.81);Other abnormalities of gait and mobility (R26.89);Muscle weakness (generalized) (M62.81)     Time: 7078-6754 PT Time Calculation (min) (ACUTE ONLY): 30 min  Charges:  $Therapeutic Activity: 23-37 mins                     10:05 AM, 09/11/22 Sandria Mcenroe A. Saverio Danker PT, DPT Physical Therapist - [facility] [place] A Yvone Slape 09/11/2022, 10:02 AM

## 2022-09-11 NOTE — Progress Notes (Signed)
Pt has DC order, to be DC in [facility]. RN called report to LPN in [place], report was given to [facility] LPN. Wife was inform as well, wife came in to drive pt to [place]. All belongings were sent to pt.

## 2022-09-13 DIAGNOSIS — L03119 Cellulitis of unspecified part of limb: Secondary | ICD-10-CM | POA: Diagnosis not present

## 2022-09-14 ENCOUNTER — Other Ambulatory Visit: Payer: Self-pay | Admitting: *Deleted

## 2022-09-14 ENCOUNTER — Other Ambulatory Visit: Payer: Self-pay | Admitting: Internal Medicine

## 2022-09-14 NOTE — Patient Outreach (Signed)
Per [facility] Mr. Baskette resides in [facility]. Screening for potential [facility] care coordination services as benefit of health plan and Primary Care Provider.   Secure communication sent and voicemail left for [facility] social workers to make aware writer is following for potential [facility] care coordination needs and transition plans.    Marthenia Rolling, MSN, RN,BSN [place] Acute Care Coordinator [phone] (Direct dial)

## 2022-09-15 DIAGNOSIS — E569 Vitamin deficiency, unspecified: Secondary | ICD-10-CM | POA: Diagnosis not present

## 2022-09-15 DIAGNOSIS — E0840 Diabetes mellitus due to underlying condition with diabetic neuropathy, unspecified: Secondary | ICD-10-CM | POA: Diagnosis not present

## 2022-09-15 DIAGNOSIS — E1121 Type 2 diabetes mellitus with diabetic nephropathy: Secondary | ICD-10-CM | POA: Diagnosis not present

## 2022-09-15 DIAGNOSIS — R609 Edema, unspecified: Secondary | ICD-10-CM | POA: Diagnosis not present

## 2022-09-16 ENCOUNTER — Encounter: Payer: Medicare Other | Admitting: Dermatology

## 2022-09-17 DIAGNOSIS — L03119 Cellulitis of unspecified part of limb: Secondary | ICD-10-CM | POA: Diagnosis not present

## 2022-09-17 DIAGNOSIS — I10 Essential (primary) hypertension: Secondary | ICD-10-CM | POA: Diagnosis not present

## 2022-09-21 DIAGNOSIS — R600 Localized edema: Secondary | ICD-10-CM | POA: Diagnosis not present

## 2022-09-21 DIAGNOSIS — R5381 Other malaise: Secondary | ICD-10-CM | POA: Diagnosis not present

## 2022-09-22 ENCOUNTER — Telehealth: Payer: Self-pay | Admitting: Internal Medicine

## 2022-09-22 DIAGNOSIS — R0602 Shortness of breath: Secondary | ICD-10-CM | POA: Diagnosis not present
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
## 2022-09-23 ENCOUNTER — Encounter: Payer: Self-pay | Admitting: Internal Medicine

## 2022-09-23 ENCOUNTER — Ambulatory Visit (INDEPENDENT_AMBULATORY_CARE_PROVIDER_SITE_OTHER): Payer: Medicare Other | Admitting: Internal Medicine

## 2022-09-23 VITALS — BP 120/78 | HR 80 | Temp 97.6°F | Ht 66.0 in | Wt 233.0 lb

## 2022-09-23 DIAGNOSIS — M0600 Rheumatoid arthritis without rheumatoid factor, unspecified site: Secondary | ICD-10-CM | POA: Diagnosis not present

## 2022-09-23 DIAGNOSIS — E1122 Type 2 diabetes mellitus with diabetic chronic kidney disease: Secondary | ICD-10-CM

## 2022-09-23 DIAGNOSIS — L03119 Cellulitis of unspecified part of limb: Secondary | ICD-10-CM | POA: Diagnosis not present

## 2022-09-23 DIAGNOSIS — N1831 Chronic kidney disease, stage 3a: Secondary | ICD-10-CM | POA: Diagnosis not present

## 2022-09-23 DIAGNOSIS — E785 Hyperlipidemia, unspecified: Secondary | ICD-10-CM | POA: Diagnosis not present

## 2022-09-23 DIAGNOSIS — M6281 Muscle weakness (generalized): Secondary | ICD-10-CM | POA: Diagnosis not present

## 2022-09-23 DIAGNOSIS — I10 Essential (primary) hypertension: Secondary | ICD-10-CM | POA: Diagnosis not present

## 2022-09-23 NOTE — Assessment & Plan Note (Signed)
Mild joint issues but has had no problems with driving in the past Probably a good idea to have a road test---DMV forms done Might be a good idea to have OT evaluation Cabin crew services in [place])
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
## 2022-09-23 NOTE — Telephone Encounter (Signed)
Called ans spoke to pt's wife. She said he is in rehab right now. He cannot come out to go to an OV.   I suggested she call and ask if the doctor in the facility can do it. Worst case scenario is they will suspend his license until the forms are filled out and sent back.  We will hold on to the forms until she figures out what to do.
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
## 2022-09-24 DIAGNOSIS — I10 Essential (primary) hypertension: Secondary | ICD-10-CM | POA: Diagnosis not present

## 2022-09-27 ENCOUNTER — Telehealth: Payer: Self-pay

## 2022-09-27 ENCOUNTER — Other Ambulatory Visit: Payer: Self-pay | Admitting: Internal Medicine

## 2022-09-27 ENCOUNTER — Other Ambulatory Visit: Payer: Self-pay | Admitting: *Deleted

## 2022-09-27 ENCOUNTER — Telehealth: Payer: Self-pay | Admitting: *Deleted

## 2022-09-27 DIAGNOSIS — I10 Essential (primary) hypertension: Secondary | ICD-10-CM

## 2022-09-27 NOTE — Progress Notes (Signed)
  Care Coordination   Note   09/27/2022 Name: LIEV CHALUPA MRN: [id number] DOB: 06-14-1946  BING MASE is a 77 y.o. year old male who sees [name], MD for primary care. I reached out to Marco Collie by phone today to offer care coordination services.  Mr. Elza was given information about Care Coordination services today including:   The Care Coordination services include support from the care team which includes your Nurse Coordinator, Clinical Social Worker, or Pharmacist.  The Care Coordination team is here to help remove barriers to the health concerns and goals most important to you. Care Coordination services are voluntary, and the patient may decline or stop services at any time by request to their care team member.   Care Coordination Consent Status: Patient agreed to services and verbal consent obtained.   Follow up plan:  Telephone appointment with care coordination team member scheduled for:  10/01/2022  Encounter Outcome:  Pt. Scheduled from referral   Julian Hy, [place] Direct Dial: [phone]

## 2022-09-27 NOTE — Telephone Encounter (Signed)
Called and left secures voicemail with Varney Baas giving approval to delay start of services per patient request.

## 2022-09-27 NOTE — Telephone Encounter (Signed)
Daniel Kline from [facility] called in wants to make PCP aware that she will start PT and OT with pt Tomorrow 09/28/22 . # [phone]

## 2022-09-27 NOTE — Patient Outreach (Addendum)
[place] Coordinator follow up. Verified in [facility] Mr. Kimbler discharged from [facility] on Saturday, 09/25/22. Screened for [facility] care coordination services as benefit of health plan and PCP.  Lives with spouse. Will have [facility] home [facility].   Will make referral to [facility] care coordination team.   Marthenia Rolling, MSN, RN,BSN [place] Acute Care Coordinator [phone] (Direct dial)

## 2022-09-27 NOTE — Telephone Encounter (Signed)
[place] Night - Client Nonclinical Telephone Record  [facility] Client [facility] Night - Client Client Site [place] - Night Provider Viviana Simpler- MD Contact Type Call Who Is Calling Physician / Provider / Hospital Call Type Provider Call Message Only Reason for Call Request to send message to Office Initial Comment Caller states Varney Baas w/[facility] [place], [phone], called pt who has requested a date outside 50 hr window; needs an order to delay services; Additional Comment Caller states Varney Baas w/[facility] [place], [phone], fax [phone]' called pt who has requested a date outside 68 hr window; needs an order to delay services; caller declined to have an on call paged; pt is Daniel Kline [birth year] Disp. Time Disposition Final User 09/26/2022 12:08:24 PM General Information Provided Yes Jerrye Beavers Call Closed By: Jerrye Beavers Transaction Date/Time: 09/26/2022 12:03:53 PM (ET  Sending note to Dr Silvio Pate.

## 2022-09-28 ENCOUNTER — Telehealth: Payer: Self-pay | Admitting: Internal Medicine

## 2022-09-28 DIAGNOSIS — I10 Essential (primary) hypertension: Secondary | ICD-10-CM | POA: Diagnosis not present

## 2022-09-28 NOTE — Telephone Encounter (Signed)
[place] Name: [facility] Agency Name: [place] Phone #: [phone] Secure line  Service Requested: PT (examples: OT/PT/Skilled Nursing/Social Work/Speech Therapy/Wound Care)  Frequency of Visits: 1 week 5 and then 1 every other week 4  She is also requesting Nursing evaluation order to address disease and medication education and compliance

## 2022-09-28 NOTE — Telephone Encounter (Signed)
LAST APPOINTMENT DATE: 09/23/2022   NEXT APPOINTMENT DATE: 10/25/22    LAST REFILL: 08/26/22  QTY: #60

## 2022-09-29 NOTE — Telephone Encounter (Signed)
Verbal orders left on verified VM of [place].

## 2022-10-01 ENCOUNTER — Ambulatory Visit: Payer: Self-pay

## 2022-10-01 NOTE — Patient Outreach (Signed)
  Care Coordination   10/01/2022 Name: Daniel Kline MRN: [id number] DOB: 1946/05/10   Care Coordination Outreach Attempts:  An unsuccessful telephone outreach was attempted for a scheduled appointment today. HIPAA compliant voice message left with call back phone number.   Follow Up Plan:  Additional outreach attempts will be made to offer the patient care coordination information and services.   Encounter Outcome:  No Answer   Care Coordination Interventions:  No, not indicated    Quinn Plowman [facility] [phone] direct line

## 2022-10-03 ENCOUNTER — Other Ambulatory Visit: Payer: Self-pay | Admitting: Internal Medicine

## 2022-10-07 ENCOUNTER — Telehealth: Payer: Self-pay

## 2022-10-07 DIAGNOSIS — I10 Essential (primary) hypertension: Secondary | ICD-10-CM | POA: Diagnosis not present

## 2022-10-07 NOTE — Telephone Encounter (Signed)
Please see 09/28/22 phone note. I tried calling [name] with [facility] but got v/m and since V/m was left on 09/29/22 I did not leave a v/m. Sending to [place] pool.

## 2022-10-07 NOTE — Telephone Encounter (Signed)
Left another message on her verified VM

## 2022-10-07 NOTE — Telephone Encounter (Signed)
[facility] Night - Client Nonclinical Telephone Record  [facility] Client [name] Night - Client Client Site [place] - Night Provider Viviana Simpler- MD Contact Type Call Who Is Calling Patient / Member / Family / Caregiver Caller Name [name] with [place] Phone Number [phone] Patient Name Daniel Kline Patient DOB 05/13/1946 Call Type Message Only Information Provided Reason for Call Request for General Office Information Initial Comment Caller states she is calling for approval of orders for home health PT. Additional Comment Secure line for approval. Second request. Home health 1 week 5, 1 every other week 4. She had also called last week about a nurse order for him for disease and medication education. Disp. Time Disposition Final User 10/06/2022 5:10:59 PM General Information Provided Yes Maureen Ralphs Call Closed By: Maureen Ralphs Transaction Date/Time: 10/06/2022 5:07:08 PM (ET   Please see 09/28/22 phone note. I tried calling [name] with [facility] but got v/m and since V/m was left on 09/29/22 I did not leave a v/m. Sending to [place] pool.

## 2022-10-11 ENCOUNTER — Ambulatory Visit: Payer: Self-pay

## 2022-10-11 NOTE — Patient Outreach (Signed)
  Care Coordination   10/11/2022 Name: Daniel Kline MRN: [id number] DOB: 1945/12/04   Care Coordination Outreach Attempts:  An unsuccessful telephone outreach was attempted for a scheduled appointment today. HIPAA compliant voice message left with call back phone number and return call request.   Follow Up Plan:  Additional outreach attempts will be made to offer the patient care coordination information and services.   Encounter Outcome:  No Answer   Care Coordination Interventions:  No, not indicated    Quinn Plowman [facility] [place] [phone] direct line

## 2022-10-12 ENCOUNTER — Telehealth: Payer: Self-pay | Admitting: Internal Medicine

## 2022-10-12 NOTE — Telephone Encounter (Signed)
Patient dropped off document DMV, to be filled out by provider. Patient requested to send it via Call Patient to pick up within 5-days. Document is located in providers tray at front office.Please advise at Mobile [phone] (mobile)

## 2022-10-13 DIAGNOSIS — I10 Essential (primary) hypertension: Secondary | ICD-10-CM | POA: Diagnosis not present

## 2022-10-14 NOTE — Telephone Encounter (Signed)
Forms placed in Dr Alla German inbox on his desk.

## 2022-10-19 ENCOUNTER — Other Ambulatory Visit: Payer: Self-pay | Admitting: Internal Medicine

## 2022-10-19 NOTE — Telephone Encounter (Signed)
Rx sent electronically.  

## 2022-10-19 NOTE — Telephone Encounter (Signed)
Pt's wife said she does not have the copy we gave them at the [place]. I have printed what we have in the system and placed the forms in Dr Alla German inbox on his desk.

## 2022-10-20 DIAGNOSIS — I10 Essential (primary) hypertension: Secondary | ICD-10-CM | POA: Diagnosis not present

## 2022-10-20 NOTE — Telephone Encounter (Signed)
Added pages 4 and 5 to the packet and faxed it all back. Received fax confirmation sheet.

## 2022-10-21 ENCOUNTER — Ambulatory Visit: Payer: Self-pay

## 2022-10-21 ENCOUNTER — Telehealth: Payer: Self-pay | Admitting: Internal Medicine

## 2022-10-21 NOTE — Patient Outreach (Signed)
  Care Coordination   Initial Visit Note   10/21/2022 Name: Daniel Kline MRN: [id number] DOB: [birth year]  Daniel Kline is a 77 y.o. year old male who sees [name], MD for primary care. I  spoke with wife, Daniel Kline  What matters to the patients health and wellness today?   Wife states patient is doing better regaining his strength.  She states patient uses a can on occasion when ambulating. Wife states [facility] PT continues to see patient 1 x per week.  Wife reports patients left leg is red and swollen. She states this started 2-3 days ago.  Wife denies patient complaining of increase pain in leg or running a fever.  Per chart review patient scheduled for follow up visit with primary care provider on 3/ 18/24.  Wife advised to still call primary provider office today and report symptoms with left leg.    Goals Addressed             This Visit's Progress    Continued improvement post hospitalization       Interventions Today    Flowsheet Row Most Recent Value  Chronic Disease   Chronic disease during today's visit Other  [left leg cellulitis.]  General Interventions   General Interventions Discussed/Reviewed General Interventions Discussed, Doctor Visits  [Evaluation of current treatment plan related to lleft leg cellulitis and patients adherence to plan as established by provider. Reviewed scheduled/ upcoming provider visits.]  Doctor Visits Discussed/Reviewed Doctor Visits Discussed  [Wife advised to contact patients primary care provider and report symptoms of redness and swelling to left leg.]  Exercise Interventions   Exercise Discussed/Reviewed Physical Activity  Physical Activity Discussed/Reviewed Physical Activity Discussed  [Encouraged to do home exercises as recommended by physical therapist.]  Education Interventions   Education Provided Provided Education  Provided Verbal Education On Other  [Discussed signs/ symptoms of infection.  Advised  to notify provider for these symptoms.]  Pharmacy Interventions   Pharmacy Dicussed/Reviewed Pharmacy Topics Discussed  [Medications reviewed and compliance discussed.]              SDOH assessments and interventions completed:  Yes  SDOH Interventions Today    Flowsheet Row Most Recent Value  SDOH Interventions   Food Insecurity Interventions Intervention Not Indicated  Housing Interventions Intervention Not Indicated  Transportation Interventions Intervention Not Indicated        Care Coordination Interventions:  Yes, provided   Follow up plan: Follow up call scheduled for 11/22/22    Encounter Outcome:  Pt. Visit Completed   Quinn Plowman RN,BSN,CCM [place] [phone] direct line

## 2022-10-21 NOTE — Telephone Encounter (Signed)
Patient wife Daniel Kline called in and she had some questions regarding Valentin leg. She stated that she can be reached at [phone]. Thank you!

## 2022-10-21 NOTE — Telephone Encounter (Signed)
Okay--I will check it in the morning

## 2022-10-21 NOTE — Patient Instructions (Signed)
Visit Information  Thank you for taking time to visit with me today. Please don't hesitate to contact me if I can be of assistance to you.   Following are the goals we discussed today:     Our next appointment is by telephone on 11/22/22 at 10 am  Please call the care guide team at [phone] if you need to cancel or reschedule your appointment.   If you are experiencing a Mental Health or [place] or need someone to talk to, please call the Suicide and Crisis Lifeline: 988 call 1-800-273-TALK (toll free, 24 hour hotline)  The patient verbalized understanding of instructions, educational materials, and care plan provided today and agreed to receive a mailed copy of patient instructions, educational materials, and care plan.   Quinn Plowman RN,BSN,CCM [facility] Care Coordination [phone] direct line  Cellulitis, Adult  Cellulitis is a skin infection. The infected area is often warm, red, swollen, and sore. It occurs most often on the legs, feet, and toes, but can happen on any part of the body. This condition can be life-threatening without treatment. It is very important to get treated right away. What are the causes? This condition is caused by bacteria. The bacteria enter through a break in the skin, such as: A cut. A burn. A bug bite. An animal bite. An open sore. A crack. What increases the risk? Having a weak body's defense system (immune system). Being older than 78 years old. Having a blood sugar problem (diabetes). Having a long-term liver disease (cirrhosis) or kidney disease. Being very overweight (obese). Having a skin problem, such as: An itchy rash. A rash caused by a fungus. A rash with blisters. Slow movement of blood in the veins (venous stasis). Fluid buildup below the skin (edema). This condition is more likely to occur in people who: Have open cuts, burns, bites, or scrapes on the skin. Have been treated with high-energy rays  (radiation). Use IV drugs. What are the signs or symptoms? Skin that: Looks red or purple, or slightly darker than your usual skin color. Has streaks. Has spots. Is swollen. Is sore or painful when you touch it. Is warm. A fever. Chills. Blisters. Tiredness (fatigue). How is this treated? Medicines to treat infections or allergies. Rest. Placing cold or warm cloths on the skin. Staying in the hospital, if the condition is very bad. You may need medicines through an IV. Follow these instructions at home: Medicines Take over-the-counter and prescription medicines only as told by your doctor. If you were prescribed antibiotics, take them as told by your doctor. Do not stop using them even if you start to feel better. General instructions Drink enough fluid to keep your pee (urine) pale yellow. Do not touch or rub the infected area. Raise (elevate) the infected area above the level of your heart while you are sitting or lying down. Return to your normal activities when your doctor says that it is safe. Place cold or warm cloths on the area as told by your doctor. Keep all follow-up visits. Your doctor will need to make sure that a more serious infection is not developing. Contact a doctor if: You have a fever. You do not start to get better after 1-2 days of treatment. Your bone or joint under the infected area starts to hurt after the skin has healed. Your infection comes back in the same area or another area. Signs of this may include: You have a swollen bump in the area. Your red area  gets larger, turns dark in color, or hurts more. You have more fluid coming from the wound. Pus or a bad smell develops in your infected area. You have more pain. You feel sick and have muscle aches and weakness. You develop vomiting or watery poop that will not go away. Get help right away if: You see red streaks coming from the area. You notice the skin turns purple or black and falls  off. These symptoms may be an emergency. Get help right away. Call 911. Do not wait to see if the symptoms will go away. Do not drive yourself to the hospital. This information is not intended to replace advice given to you by your health care provider. Make sure you discuss any questions you have with your health care provider. Document Revised: 03/23/2022 Document Reviewed: 03/23/2022 Elsevier Patient Education  [place].

## 2022-10-21 NOTE — Telephone Encounter (Signed)
Called and spoke to pt's wife. She said his leg where he had the cellulitis is looking red and swollen. I made him an appt tomorrow morning with Dr Silvio Pate. I advised her if he starts to run a fever, it gets markedly worse, or starts draining, to go to the ER. She agreed.

## 2022-10-22 ENCOUNTER — Ambulatory Visit (INDEPENDENT_AMBULATORY_CARE_PROVIDER_SITE_OTHER): Payer: Medicare Other | Admitting: Internal Medicine

## 2022-10-22 ENCOUNTER — Encounter: Payer: Self-pay | Admitting: Internal Medicine

## 2022-10-22 VITALS — BP 122/80 | HR 68 | Temp 98.1°F | Ht 66.0 in | Wt 238.0 lb

## 2022-10-22 DIAGNOSIS — L97901 Non-pressure chronic ulcer of unspecified part of unspecified lower leg limited to breakdown of skin: Secondary | ICD-10-CM | POA: Diagnosis not present

## 2022-10-22 DIAGNOSIS — L03115 Cellulitis of right lower limb: Secondary | ICD-10-CM | POA: Diagnosis not present

## 2022-10-22 MED ORDER — AMOXICILLIN-POT CLAVULANATE 875-125 MG PO TABS: 1.0000 | ORAL_TABLET | Freq: Two times a day (BID) | ORAL | 1 refills | Status: DC

## 2022-10-22 MED ORDER — TRIAMCINOLONE ACETONIDE 0.1 % EX CREA: 1.0000 | TOPICAL_CREAM | Freq: Two times a day (BID) | CUTANEOUS | 1 refills | Status: DC | PRN

## 2022-10-22 NOTE — Patient Instructions (Signed)
Please use a moisturizing soap like Dove, Tone or [place] After you shower, then use a moisturizing cream (like Eucerin) on your itchy areas (neck and legs mostly). If you have ongoing itching, especially in your legs, then use the prescription triamcinolone cream

## 2022-10-22 NOTE — Assessment & Plan Note (Signed)
Will treat with augmentin 875 bid x 1 week

## 2022-10-22 NOTE — Assessment & Plan Note (Signed)
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

## 2022-10-22 NOTE — Progress Notes (Signed)
Subjective:    Patient ID: Daniel Kline, male    DOB: [birth year], 77 y.o.   MRN: [id number]  HPI Here due to worsening redness in his legs  Has noticed increased redness in right calf Gets itching and he scratches---it will cause skin breaks Mild chronic swelling  Uses antibiotic soap Doesn't use moisturizer  No fever No sweats---chronic chills  Current Outpatient Medications on File Prior to Visit  Medication Sig Dispense Refill   amLODipine (NORVASC) 5 MG tablet TAKE 1 TABLET BY MOUTH DAILY 90 tablet 3   carvedilol (COREG) 3.125 MG tablet TAKE 1 TABLET BY MOUTH 2 TIMES DAILY WITH A MEAL. 180 tablet 3   Cholecalciferol (VITAMIN D-3) 25 MCG (1000 UT) CAPS Take 1,000 Units by mouth daily.     clobetasol cream (TEMOVATE) [id number] % Apply 1 application topically 2 (two) times daily as needed (irritation).      clorazepate (TRANXENE) 7.5 MG tablet TAKE 1 TABLET BY MOUTH TWICE A DAY AS NEEDED FOR ANXIETY 60 tablet 0   EPINEPHrine 0.3 mg/0.3 mL IJ SOAJ injection Inject 0.3 mLs (0.3 mg total) into the muscle as needed for anaphylaxis. 1 each 5   Ferrous Sulfate (IRON PO) Take 1 tablet by mouth at bedtime.     folic acid (FOLVITE) 1 MG tablet Take 1 mg by mouth daily. Every day except day that he takes methotrexate     furosemide (LASIX) 40 MG tablet Take 1 tablet (40 mg total) by mouth daily. 180 tablet 3   gabapentin (NEURONTIN) 300 MG capsule TAKE 1 CAPSULE BY MOUTH 3 TIMES  DAILY 300 capsule 2   glucose blood (ONETOUCH ULTRA) test strip 1 each by Other route daily. Use to check blood sugar once daily 100 each 3   HYDROcodone-acetaminophen (NORCO/VICODIN) 5-325 MG tablet TAKE 1 TABLET BY MOUTH EVERY 6 HOUR AS NEEDED FOR PAIN 60 tablet 0   hydrOXYzine (ATARAX) 25 MG tablet TAKE ONE TABLET EVERY EIGHT HOURS AS NEEDED 90 tablet 3   isosorbide mononitrate (IMDUR) 60 MG 24 hr tablet Take 1 tablet (60 mg total) by mouth daily before lunch. 90 tablet 3   liraglutide (VICTOZA) 18 MG/3ML SOPN  Inject 1.2 mg into the skin daily. 1 mL 0   losartan (COZAAR) 100 MG tablet TAKE 1 TABLET BY MOUTH DAILY 90 tablet 1   MAGNESIUM PO Take 1 tablet by mouth daily.     metFORMIN (GLUCOPHAGE) 500 MG tablet Take 500 mg by mouth 2 (two) times daily with a meal.     methotrexate (RHEUMATREX) 2.5 MG tablet Take 10 mg by mouth once a week.     Multiple Vitamins-Minerals (MULTIVITAL) tablet Take 1 tablet by mouth daily.     rosuvastatin (CRESTOR) 20 MG tablet TAKE 1 TABLET BY MOUTH  DAILY 90 tablet 3   timolol (TIMOPTIC) 0.5 % ophthalmic solution Place 1 drop into both eyes 2 (two) times daily.     RINVOQ 15 MG TB24 Take 1 tablet by mouth daily. (Patient not taking: Reported on 10/22/2022)     No current facility-administered medications on file prior to visit.    Allergies  Allergen Reactions   Bee Venom Anaphylaxis   Oxycodone Other (See Comments)    Delusions   Hydromorphone Other (See Comments)    hallucinating   Hydroxychloroquine     Other reaction(s): Other (See Comments) He broke out really badly.   Zolpidem Other (See Comments)    Past Medical History:  Diagnosis Date   Anemia  H/O   Anxiety    Arthritis    Atrial flutter ([place])    a. s/p post ablation in 04/2017   Chronic kidney disease    Complication of anesthesia    CTCL (cutaneous T-cell lymphoma) (HCC)    Diabetes mellitus without complication (HCC)    Diastolic dysfunction    a. 03/2022 Echo: EF 60-65%, no rwma, GrI DD, nl RV fxn.   Dysplastic nevus 12/19/2017   Right distal lat. forearm near wrist. Severe atypia, close to peripheral margin.   Dysplastic nevus 06/21/2018   Upper back right paraspinal. Severe atypia, peripheral margin involved. Excised 07/11/2018, margins free.   Family history of adverse reaction to anesthesia    PT WAS ADOPTED   HLD (hyperlipidemia)    HTN (hypertension)    Hx of dysplastic nevus 2019   multiple sites   Hx of squamous cell carcinoma 01/18/2018   R mid lateral forearm   MRSA  (methicillin resistant Staphylococcus aureus)    after back surgery   OSA (obstructive sleep apnea)    USES BIPAP   Polio    POLIOMYELITIS 01/12/2010   Right arm affected   PONV (postoperative nausea and vomiting)    Squamous cell carcinoma of skin 12/19/2017   Right mid lat. forearm. SCCis, hypertrophic.    Past Surgical History:  Procedure Laterality Date   BACK SURGERY     LUMBAR   CARDIAC ELECTROPHYSIOLOGY STUDY AND ABLATION  2019   CATARACT EXTRACTION W/PHACO Right 05/05/2022   Procedure: CATARACT EXTRACTION PHACO AND INTRAOCULAR LENS PLACEMENT ([place]) RIGHT;  Surgeon: Leandrew Koyanagi, MD;  Location: [place];  Service: Ophthalmology;  Laterality: Right;  Diabetic 10.42 01:13.4   COLONOSCOPY WITH PROPOFOL N/A 04/04/2019   Procedure: COLONOSCOPY WITH PROPOFOL;  Surgeon: [place], Benay Pike, MD;  Location: [facility];  Service: Gastroenterology;  Laterality: N/A;   I & D EXTREMITY Right 02/01/2020   Procedure: IRRIGATION AND DEBRIDEMENT EXTREMITY with poly exchange;  Surgeon: Dereck Leep, MD;  Location: [facility];  Service: Orthopedics;  Laterality: Right;   INCISION AND DRAINAGE     BACK-MRSA INFECTION AFTER BACK SURGERY   KNEE ARTHROPLASTY Right 01/28/2020   Procedure: COMPUTER ASSISTED TOTAL KNEE ARTHROPLASTY;  Surgeon: Dereck Leep, MD;  Location: [facility];  Service: Orthopedics;  Laterality: Right;   MOUTH SURGERY     right elbow surgery     right knee surgery     right shoulder surgery     from polio damage   TONSILLECTOMY     TOTAL HIP ARTHROPLASTY Bilateral 04/2016    Family History  Adopted: Yes    Social History   Socioeconomic History   Marital status: Married    Spouse name: Not on file   Number of children: 0   Years of education: Not on file   Highest education level: Not on file  Occupational History   Occupation: Nature conservation officer    Comment: 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   Occupation: Cytogeneticist    Comment: Retired    Occupation: Warden/ranger    Comment: Retired  Tobacco Use   Smoking status: Former    Types: Cigars    Quit date: 09/23/1990    Years since quitting: 32.1    Passive exposure: Past (as a child)   Smokeless tobacco: Former    Quit date: 02/25/2006  Vaping Use   Vaping Use: Never used  Substance and Sexual Activity   Alcohol use: No    Alcohol/week: 0.0 standard drinks  of alcohol   Drug use: No   Sexual activity: Yes    Partners: Female  Other Topics Concern   Not on file  Social History Narrative   Has living will   Wife is health care POA---then brother or sister   Would accept resuscitation attempts but no prolonged ventilation or tube feeds   Social Determinants of Health   Financial Resource Strain: Low Risk  (07/20/2017)   Overall Financial Resource Strain (CARDIA)    Difficulty of Paying Living Expenses: Not hard at all  Food Insecurity: No Food Insecurity (10/21/2022)   Hunger Vital Sign    Worried About Running Out of Food in the Last Year: Never true    Ran Out of Food in the Last Year: Never true  Transportation Needs: No Transportation Needs (10/21/2022)   PRAPARE - Hydrologist (Medical): No    Lack of Transportation (Non-Medical): No  Physical Activity: Not on file  Stress: Not on file  Social Connections: Unknown (07/20/2017)   Social Connection and Isolation Panel [NHANES]    Frequency of Communication with Friends and Family: Patient declined    Frequency of Social Gatherings with Friends and Family: Patient declined    Attends Religious Services: Patient declined    Marine scientist or Organizations: Patient declined    Attends Archivist Meetings: Patient declined    Marital Status: Patient declined  Intimate Partner Violence: Not At Risk (09/05/2022)   Humiliation, Afraid, Rape, and Kick questionnaire    Fear of Current or Ex-Partner: No    Emotionally Abused: No    Physically Abused:  No    Sexually Abused: No   Review of Systems Doesn't feel sick Eating okay     Objective:   Physical Exam Constitutional:      Appearance: Normal appearance.  Skin:    Comments: Multiple superficial ulcers---some with dark eschar--both calves Redness/warmth/tenderness lateral mid right calf  Neurological:     Mental Status: He is alert.            Assessment & Plan:

## 2022-10-23 DIAGNOSIS — I10 Essential (primary) hypertension: Secondary | ICD-10-CM | POA: Diagnosis not present

## 2022-10-25 ENCOUNTER — Ambulatory Visit (INDEPENDENT_AMBULATORY_CARE_PROVIDER_SITE_OTHER): Payer: Medicare Other | Admitting: Internal Medicine

## 2022-10-25 ENCOUNTER — Encounter: Payer: Self-pay | Admitting: Internal Medicine

## 2022-10-25 VITALS — BP 138/84 | HR 68 | Temp 97.1°F | Ht 66.0 in | Wt 233.0 lb

## 2022-10-25 DIAGNOSIS — E1122 Type 2 diabetes mellitus with diabetic chronic kidney disease: Secondary | ICD-10-CM | POA: Diagnosis not present

## 2022-10-25 DIAGNOSIS — N1832 Chronic kidney disease, stage 3b: Secondary | ICD-10-CM

## 2022-10-25 DIAGNOSIS — F39 Unspecified mood [affective] disorder: Secondary | ICD-10-CM | POA: Diagnosis not present

## 2022-10-25 DIAGNOSIS — C84A0 Cutaneous T-cell lymphoma, unspecified, unspecified site: Secondary | ICD-10-CM

## 2022-10-25 DIAGNOSIS — G894 Chronic pain syndrome: Secondary | ICD-10-CM

## 2022-10-25 DIAGNOSIS — Z0000 Encounter for general adult medical examination without abnormal findings: Secondary | ICD-10-CM

## 2022-10-25 DIAGNOSIS — F1120 Opioid dependence, uncomplicated: Secondary | ICD-10-CM

## 2022-10-25 LAB — HM DIABETES FOOT EXAM

## 2022-10-25 NOTE — Assessment & Plan Note (Signed)
Using the hydrocodone per Rx

## 2022-10-25 NOTE — Patient Instructions (Signed)
Please get your tetanus booster at the pharmacy. Also get the RSV anytime, and the flu vaccine in the fall (as well as keeping up with COVID vaccines)

## 2022-10-25 NOTE — Assessment & Plan Note (Signed)
Mild dysthymia and anxiety Uses the tranxene prn

## 2022-10-25 NOTE — Assessment & Plan Note (Signed)
PDMP reviewed No concerns 
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
## 2022-10-25 NOTE — Assessment & Plan Note (Signed)
Lab Results  Component Value Date   HGBA1C 6.2 (A) 07/15/2022   Good control Will recheck next time On liraglutide 1.2mg  daily, metformin 500 bid

## 2022-10-25 NOTE — Assessment & Plan Note (Signed)
BMI is stable Does try to watch his eating

## 2022-10-25 NOTE — Assessment & Plan Note (Signed)
Has used light box--but no therapy at present

## 2022-10-25 NOTE — Assessment & Plan Note (Signed)
Stable GFR over the past year  On losartan 100

## 2022-10-25 NOTE — Assessment & Plan Note (Signed)
I have personally reviewed the Medicare Annual Wellness questionnaire and have noted 1. The patient's medical and social history 2. Their use of alcohol, tobacco or illicit drugs 3. Their current medications and supplements 4. The patient's functional ability including ADL's, fall risks, home safety risks and hearing or visual             impairment. 5. Diet and physical activities 6. Evidence for depression or mood disorders  The patients weight, height, BMI and visual acuity have been recorded in the chart I have made referrals, counseling and provided education to the patient based review of the above and I have provided the pt with a written personalized care plan for preventive services.  I have provided you with a copy of your personalized plan for preventive services. Please take the time to review along with your updated medication list.  Does try to exercise regularly Consider last colon next year No PSA due to age Needs Td at pharmacy Flu, [place] and RSV by next fall

## 2022-10-25 NOTE — Progress Notes (Signed)
Hearing Screening - Comments:: Did not pass whisper test Vision Screening - Comments:: August 2023  

## 2022-10-26 ENCOUNTER — Other Ambulatory Visit: Payer: Self-pay | Admitting: Internal Medicine

## 2022-10-26 DIAGNOSIS — I10 Essential (primary) hypertension: Secondary | ICD-10-CM | POA: Diagnosis not present

## 2022-10-26 NOTE — Telephone Encounter (Signed)
Name of Medication: Hydrocodone Name of Pharmacy: [facility] or Written Date and Quantity: 09-28-22 #60 Last Office Visit and Type: 10-25-22 Next Office Visit and Type: 01-27-23 Last Controlled Substance Agreement Date: 12-24-21 Last UDS: 12-24-21

## 2022-10-28 ENCOUNTER — Other Ambulatory Visit: Payer: Self-pay | Admitting: Internal Medicine

## 2022-10-29 ENCOUNTER — Other Ambulatory Visit: Payer: Self-pay | Admitting: Internal Medicine

## 2022-10-29 DIAGNOSIS — Z7984 Long term (current) use of oral hypoglycemic drugs: Secondary | ICD-10-CM | POA: Diagnosis not present

## 2022-10-29 DIAGNOSIS — E785 Hyperlipidemia, unspecified: Secondary | ICD-10-CM | POA: Diagnosis not present

## 2022-10-29 DIAGNOSIS — I129 Hypertensive chronic kidney disease with stage 1 through stage 4 chronic kidney disease, or unspecified chronic kidney disease: Secondary | ICD-10-CM | POA: Diagnosis not present

## 2022-10-29 DIAGNOSIS — E1140 Type 2 diabetes mellitus with diabetic neuropathy, unspecified: Secondary | ICD-10-CM

## 2022-10-29 DIAGNOSIS — Z96643 Presence of artificial hip joint, bilateral: Secondary | ICD-10-CM | POA: Diagnosis not present

## 2022-10-29 DIAGNOSIS — H409 Unspecified glaucoma: Secondary | ICD-10-CM | POA: Diagnosis not present

## 2022-10-29 DIAGNOSIS — D631 Anemia in chronic kidney disease: Secondary | ICD-10-CM | POA: Diagnosis not present

## 2022-10-29 DIAGNOSIS — L03119 Cellulitis of unspecified part of limb: Secondary | ICD-10-CM | POA: Diagnosis not present

## 2022-10-29 DIAGNOSIS — Z96651 Presence of right artificial knee joint: Secondary | ICD-10-CM | POA: Diagnosis not present

## 2022-10-29 DIAGNOSIS — N189 Chronic kidney disease, unspecified: Secondary | ICD-10-CM | POA: Diagnosis not present

## 2022-10-29 DIAGNOSIS — Z7985 Long-term (current) use of injectable non-insulin antidiabetic drugs: Secondary | ICD-10-CM | POA: Diagnosis not present

## 2022-10-29 DIAGNOSIS — F419 Anxiety disorder, unspecified: Secondary | ICD-10-CM

## 2022-10-29 DIAGNOSIS — E1122 Type 2 diabetes mellitus with diabetic chronic kidney disease: Secondary | ICD-10-CM | POA: Diagnosis not present

## 2022-10-29 DIAGNOSIS — M069 Rheumatoid arthritis, unspecified: Secondary | ICD-10-CM | POA: Diagnosis not present

## 2022-11-02 ENCOUNTER — Other Ambulatory Visit: Payer: Self-pay | Admitting: Internal Medicine

## 2022-11-03 ENCOUNTER — Other Ambulatory Visit: Payer: Self-pay | Admitting: Internal Medicine

## 2022-11-03 DIAGNOSIS — D631 Anemia in chronic kidney disease: Secondary | ICD-10-CM | POA: Diagnosis not present

## 2022-11-03 DIAGNOSIS — N189 Chronic kidney disease, unspecified: Secondary | ICD-10-CM | POA: Diagnosis not present

## 2022-11-03 DIAGNOSIS — E1122 Type 2 diabetes mellitus with diabetic chronic kidney disease: Secondary | ICD-10-CM | POA: Diagnosis not present

## 2022-11-03 DIAGNOSIS — M069 Rheumatoid arthritis, unspecified: Secondary | ICD-10-CM | POA: Diagnosis not present

## 2022-11-03 DIAGNOSIS — I129 Hypertensive chronic kidney disease with stage 1 through stage 4 chronic kidney disease, or unspecified chronic kidney disease: Secondary | ICD-10-CM | POA: Diagnosis not present

## 2022-11-03 DIAGNOSIS — Z96651 Presence of right artificial knee joint: Secondary | ICD-10-CM | POA: Diagnosis not present

## 2022-11-03 DIAGNOSIS — Z96643 Presence of artificial hip joint, bilateral: Secondary | ICD-10-CM | POA: Diagnosis not present

## 2022-11-03 DIAGNOSIS — Z7984 Long term (current) use of oral hypoglycemic drugs: Secondary | ICD-10-CM | POA: Diagnosis not present

## 2022-11-03 DIAGNOSIS — E785 Hyperlipidemia, unspecified: Secondary | ICD-10-CM | POA: Diagnosis not present

## 2022-11-03 DIAGNOSIS — Z7985 Long-term (current) use of injectable non-insulin antidiabetic drugs: Secondary | ICD-10-CM | POA: Diagnosis not present

## 2022-11-03 DIAGNOSIS — L03119 Cellulitis of unspecified part of limb: Secondary | ICD-10-CM | POA: Diagnosis not present

## 2022-11-03 DIAGNOSIS — E1140 Type 2 diabetes mellitus with diabetic neuropathy, unspecified: Secondary | ICD-10-CM | POA: Diagnosis not present

## 2022-11-03 DIAGNOSIS — H409 Unspecified glaucoma: Secondary | ICD-10-CM | POA: Diagnosis not present

## 2022-11-10 DIAGNOSIS — E785 Hyperlipidemia, unspecified: Secondary | ICD-10-CM | POA: Diagnosis not present

## 2022-11-10 DIAGNOSIS — Z96643 Presence of artificial hip joint, bilateral: Secondary | ICD-10-CM | POA: Diagnosis not present

## 2022-11-10 DIAGNOSIS — Z7985 Long-term (current) use of injectable non-insulin antidiabetic drugs: Secondary | ICD-10-CM | POA: Diagnosis not present

## 2022-11-10 DIAGNOSIS — N189 Chronic kidney disease, unspecified: Secondary | ICD-10-CM | POA: Diagnosis not present

## 2022-11-10 DIAGNOSIS — L03119 Cellulitis of unspecified part of limb: Secondary | ICD-10-CM | POA: Diagnosis not present

## 2022-11-10 DIAGNOSIS — H409 Unspecified glaucoma: Secondary | ICD-10-CM | POA: Diagnosis not present

## 2022-11-10 DIAGNOSIS — D631 Anemia in chronic kidney disease: Secondary | ICD-10-CM | POA: Diagnosis not present

## 2022-11-10 DIAGNOSIS — Z96651 Presence of right artificial knee joint: Secondary | ICD-10-CM | POA: Diagnosis not present

## 2022-11-10 DIAGNOSIS — E1122 Type 2 diabetes mellitus with diabetic chronic kidney disease: Secondary | ICD-10-CM | POA: Diagnosis not present

## 2022-11-10 DIAGNOSIS — I129 Hypertensive chronic kidney disease with stage 1 through stage 4 chronic kidney disease, or unspecified chronic kidney disease: Secondary | ICD-10-CM | POA: Diagnosis not present

## 2022-11-10 DIAGNOSIS — E1140 Type 2 diabetes mellitus with diabetic neuropathy, unspecified: Secondary | ICD-10-CM | POA: Diagnosis not present

## 2022-11-10 DIAGNOSIS — M069 Rheumatoid arthritis, unspecified: Secondary | ICD-10-CM | POA: Diagnosis not present

## 2022-11-10 DIAGNOSIS — Z7984 Long term (current) use of oral hypoglycemic drugs: Secondary | ICD-10-CM | POA: Diagnosis not present

## 2022-11-13 ENCOUNTER — Other Ambulatory Visit: Payer: Self-pay | Admitting: Internal Medicine

## 2022-11-15 ENCOUNTER — Telehealth: Payer: Self-pay

## 2022-11-15 ENCOUNTER — Telehealth: Payer: Self-pay | Admitting: Pharmacist

## 2022-11-15 DIAGNOSIS — I10 Essential (primary) hypertension: Secondary | ICD-10-CM

## 2022-11-15 DIAGNOSIS — E1121 Type 2 diabetes mellitus with diabetic nephropathy: Secondary | ICD-10-CM

## 2022-11-15 DIAGNOSIS — I480 Paroxysmal atrial fibrillation: Secondary | ICD-10-CM

## 2022-11-15 NOTE — Telephone Encounter (Signed)
PharmD reviewed patient chart to assess eligibility for [facility] services. Patient was determined to be a good candidate for the program given the complexity of the medication regimen and overall risk for hospitalization and/or high healthcare utilization.   Referral entered in order to outreach patient and offer appointment with PharmD. Referral cosigned to PCP.  

## 2022-11-15 NOTE — Progress Notes (Unsigned)
Care Management & Coordination Services Pharmacy Team  Reason for Encounter: Initial Appointment Reminder  Contacted patient to confirm in office appointment with Al Corpus, PharmD on 11/19/22 at 11:00.  {US HC Outreach:28874}  Do you have any problems getting your medications? {yes/no:20286} If yes what types of problems are you experiencing? {Problems:27223}  What is your top health concern you would like to discuss at your upcoming visit?   Have you seen any other providers since your last visit with PCP? No   Chart review:  Recent office visits:    Recent consult visits:    07/13/22 Sharman Crate, MD (Dermatology) Cutaneous T-cell lymphoma  F/U 4 months 06/24/22 Armida Sans, MD (Dermatology) Cutaneous T-cell lymphoma Change: Ketoconazole 2% Change: Mupirocin 2% F/U 6 months 06/08/22 Francesco Sor, MD Total Joint F/U - Right Knee Pt given knee exercises F/U 1 year  Hospital visits:  {Hospital DC Yes/No:21091515}   Star Rating Drugs: *** Medication:  Last Fill: Day Supply   Care Gaps: Annual wellness visit in last year? Yes 10/25/2022  If Diabetic: Last eye exam / retinopathy screening: Last diabetic foot exam:  Al Corpus, PharmD notified  Claudina Lick, [place] Clinical Pharmacy Assistant [phone]
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
## 2022-11-17 DIAGNOSIS — Z96643 Presence of artificial hip joint, bilateral: Secondary | ICD-10-CM | POA: Diagnosis not present

## 2022-11-17 DIAGNOSIS — I129 Hypertensive chronic kidney disease with stage 1 through stage 4 chronic kidney disease, or unspecified chronic kidney disease: Secondary | ICD-10-CM | POA: Diagnosis not present

## 2022-11-17 DIAGNOSIS — D631 Anemia in chronic kidney disease: Secondary | ICD-10-CM | POA: Diagnosis not present

## 2022-11-17 DIAGNOSIS — Z7985 Long-term (current) use of injectable non-insulin antidiabetic drugs: Secondary | ICD-10-CM | POA: Diagnosis not present

## 2022-11-17 DIAGNOSIS — E1122 Type 2 diabetes mellitus with diabetic chronic kidney disease: Secondary | ICD-10-CM | POA: Diagnosis not present

## 2022-11-17 DIAGNOSIS — Z96651 Presence of right artificial knee joint: Secondary | ICD-10-CM | POA: Diagnosis not present

## 2022-11-17 DIAGNOSIS — N189 Chronic kidney disease, unspecified: Secondary | ICD-10-CM | POA: Diagnosis not present

## 2022-11-17 DIAGNOSIS — E1140 Type 2 diabetes mellitus with diabetic neuropathy, unspecified: Secondary | ICD-10-CM | POA: Diagnosis not present

## 2022-11-17 DIAGNOSIS — H409 Unspecified glaucoma: Secondary | ICD-10-CM | POA: Diagnosis not present

## 2022-11-17 DIAGNOSIS — M069 Rheumatoid arthritis, unspecified: Secondary | ICD-10-CM | POA: Diagnosis not present

## 2022-11-17 DIAGNOSIS — E785 Hyperlipidemia, unspecified: Secondary | ICD-10-CM | POA: Diagnosis not present

## 2022-11-17 DIAGNOSIS — L03119 Cellulitis of unspecified part of limb: Secondary | ICD-10-CM | POA: Diagnosis not present

## 2022-11-17 DIAGNOSIS — Z7984 Long term (current) use of oral hypoglycemic drugs: Secondary | ICD-10-CM | POA: Diagnosis not present

## 2022-11-18 ENCOUNTER — Other Ambulatory Visit: Payer: Self-pay | Admitting: Internal Medicine

## 2022-11-19 ENCOUNTER — Ambulatory Visit: Payer: Medicare Other | Admitting: Pharmacist

## 2022-11-19 NOTE — Patient Instructions (Addendum)
Visit Information  Phone number for Pharmacist: [phone]  Thank you for meeting with me to discuss your medications! I look forward to working with you to achieve your health care goals. Below is a summary of what we talked about during the visit:  RINVOQ is approved through the manufacturer, you can call them to set up the shipment, just tell them your name and that you are trying to set up the shipment: [phone]  Restart iron (ferrous sulfate) 325 mg daily.  STOP taking your glipizide for now. We will check your A1c in June when you come to see Dr Alphonsus Sias.   Try using a weekly pill box to organize your medicines.    Al Corpus, PharmD, BCACP Clinical Pharmacist [facility] [place] [phone]
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
## 2022-11-22 DIAGNOSIS — Z7985 Long-term (current) use of injectable non-insulin antidiabetic drugs: Secondary | ICD-10-CM | POA: Diagnosis not present

## 2022-11-22 DIAGNOSIS — D631 Anemia in chronic kidney disease: Secondary | ICD-10-CM | POA: Diagnosis not present

## 2022-11-22 DIAGNOSIS — E1140 Type 2 diabetes mellitus with diabetic neuropathy, unspecified: Secondary | ICD-10-CM | POA: Diagnosis not present

## 2022-11-22 DIAGNOSIS — E1122 Type 2 diabetes mellitus with diabetic chronic kidney disease: Secondary | ICD-10-CM | POA: Diagnosis not present

## 2022-11-22 DIAGNOSIS — N189 Chronic kidney disease, unspecified: Secondary | ICD-10-CM | POA: Diagnosis not present

## 2022-11-22 DIAGNOSIS — H409 Unspecified glaucoma: Secondary | ICD-10-CM | POA: Diagnosis not present

## 2022-11-22 DIAGNOSIS — Z96643 Presence of artificial hip joint, bilateral: Secondary | ICD-10-CM | POA: Diagnosis not present

## 2022-11-22 DIAGNOSIS — Z96651 Presence of right artificial knee joint: Secondary | ICD-10-CM | POA: Diagnosis not present

## 2022-11-22 DIAGNOSIS — I129 Hypertensive chronic kidney disease with stage 1 through stage 4 chronic kidney disease, or unspecified chronic kidney disease: Secondary | ICD-10-CM | POA: Diagnosis not present

## 2022-11-22 DIAGNOSIS — L03119 Cellulitis of unspecified part of limb: Secondary | ICD-10-CM | POA: Diagnosis not present

## 2022-11-22 DIAGNOSIS — Z7984 Long term (current) use of oral hypoglycemic drugs: Secondary | ICD-10-CM | POA: Diagnosis not present

## 2022-11-22 DIAGNOSIS — E785 Hyperlipidemia, unspecified: Secondary | ICD-10-CM | POA: Diagnosis not present

## 2022-11-22 DIAGNOSIS — M069 Rheumatoid arthritis, unspecified: Secondary | ICD-10-CM | POA: Diagnosis not present

## 2022-11-24 ENCOUNTER — Ambulatory Visit: Payer: Medicare Other | Attending: Nurse Practitioner | Admitting: Nurse Practitioner

## 2022-11-24 ENCOUNTER — Other Ambulatory Visit
Admission: RE | Admit: 2022-11-24 | Discharge: 2022-11-24 | Disposition: A | Discharge status: Home / Self Care | Payer: Medicare Other | Source: Ambulatory Visit | Attending: Nurse Practitioner | Admitting: Nurse Practitioner

## 2022-11-24 ENCOUNTER — Ambulatory Visit: Payer: Self-pay

## 2022-11-24 ENCOUNTER — Encounter: Payer: Self-pay | Admitting: Nurse Practitioner

## 2022-11-24 VITALS — BP 165/92 | HR 75 | Ht 66.0 in | Wt 232.0 lb

## 2022-11-24 DIAGNOSIS — I483 Typical atrial flutter: Secondary | ICD-10-CM

## 2022-11-24 DIAGNOSIS — N1832 Chronic kidney disease, stage 3b: Secondary | ICD-10-CM

## 2022-11-24 DIAGNOSIS — E1121 Type 2 diabetes mellitus with diabetic nephropathy: Secondary | ICD-10-CM

## 2022-11-24 DIAGNOSIS — I5032 Chronic diastolic (congestive) heart failure: Secondary | ICD-10-CM | POA: Diagnosis not present

## 2022-11-24 DIAGNOSIS — E782 Mixed hyperlipidemia: Secondary | ICD-10-CM

## 2022-11-24 DIAGNOSIS — I10 Essential (primary) hypertension: Secondary | ICD-10-CM | POA: Diagnosis not present

## 2022-11-24 LAB — BASIC METABOLIC PANEL
Anion gap: 7 (ref 5–15)
BUN: 34 mg/dL — ABNORMAL HIGH (ref 8–23)
CO2: 25 mmol/L (ref 22–32)
Calcium: 8.9 mg/dL (ref 8.9–10.3)
Chloride: 108 mmol/L (ref 98–111)
Creatinine, Ser: 2.24 mg/dL — ABNORMAL HIGH (ref 0.61–1.24)
GFR, Estimated: 29 mL/min — ABNORMAL LOW (ref >60)
Glucose, Bld: 96 mg/dL (ref 70–99)
Potassium: 4.1 mmol/L (ref 3.5–5.1)
Sodium: 140 mmol/L (ref 135–145)

## 2022-11-24 NOTE — Patient Outreach (Signed)
  Care Coordination   Follow Up Visit Note   11/24/2022 Name: Daniel Kline MRN: [id number] DOB: [birth year]  Daniel Kline is a 77 y.o. year old male who sees [name], MD for primary care. I  spoke with patients wife, Rany Tenesaca.   What matters to the patients health and wellness today?  Wife states patient is doing very well. She states he was treated cellulitis to his calf after last conversation with RNCM. She states this has cleared up. She reports patient is using moisturizing cream to his legs which helps with the itching.  Wife states patients physical therapy was discontinued approximately 1 week ago.  She states patients mobility is better and his clarity of mind is better.      Goals Addressed             This Visit's Progress    Continued improvement post hospitalization       Interventions Today    Flowsheet Row Most Recent Value  Chronic Disease   Chronic disease during today's visit Other  [Anemia, left leg cellulitis]  General Interventions   General Interventions Discussed/Reviewed General Interventions Reviewed, Doctor Visits, Labs  [evaluation of current treatment plan for leg cellulitis, anemia and patients adherence to plan as established by provider.  Assessed for leg cellulitis  / anemia symptoms. Asessed for ongoing home health services.]  Labs --  [Reviewed most recent labs.]  Doctor Visits Discussed/Reviewed Doctor Visits Reviewed  [name] scheduled / upcoming provider visits.]  Exercise Interventions   Exercise Discussed/Reviewed Physical Activity  Physical Activity Discussed/Reviewed Physical Activity Reviewed  [Advised patient to continue exercises instructed by PT.]  Education Interventions   Education Provided Provided Education  Provided Verbal Education On Other  [reviewed signs/ symptoms of infection.  Advised to contact provider as soon as possible if symptoms noted.]  Pharmacy Interventions   Pharmacy Dicussed/Reviewed Pharmacy  Topics Reviewed  [medications reviewed and compliance discussed.]              SDOH assessments and interventions completed:  No     Care Coordination Interventions:  Yes, provided   Follow up plan: Follow up call scheduled for 01/18/23    Encounter Outcome:  Pt. Visit Completed   [name] RN,BSN,CCM [facility] Care Coordination [phone] direct line

## 2022-11-24 NOTE — Progress Notes (Signed)
Office Visit    Patient Name: Daniel T HughKEIONTE SWICEGOODcounter: 11/24/2022  Primary Care Provider:  [name], MD Primary Cardiologist:  Julien Nordmann, MD  Chief Complaint    77 year old male with a history of atrial flutter status post catheter ablation in September 2018, chronic HFpEF, hypertension, hyperlipidemia, diabetes, stage III chronic kidney disease, cutaneous T-cell lymphoma, chronic low back pain, and sleep apnea on CPAP, who presents for follow-up of atrial arrhythmias.  Past Medical History    Past Medical History:  Diagnosis Date   Anemia    H/O   Anxiety    Arthritis    Atrial flutter    a. s/p post ablation in 04/2017   Chronic kidney disease    Complication of anesthesia    CTCL (cutaneous T-cell lymphoma)    Diabetes mellitus without complication    Diastolic dysfunction    a. 03/2022 Echo: EF 60-65%, no rwma, GrI DD, nl RV fxn.   Dysplastic nevus 12/19/2017   Right distal lat. forearm near wrist. Severe atypia, close to peripheral margin.   Dysplastic nevus 06/21/2018   Upper back right paraspinal. Severe atypia, peripheral margin involved. Excised 07/11/2018, margins free.   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 forearm. SCCis, hypertrophic.   Past Surgical History:  Procedure Laterality Date   BACK SURGERY     LUMBAR   CARDIAC ELECTROPHYSIOLOGY STUDY AND ABLATION  2019   CATARACT EXTRACTION W/PHACO Right 05/05/2022   Procedure: CATARACT EXTRACTION PHACO AND  INTRAOCULAR LENS PLACEMENT (IOC) RIGHT;  Surgeon: Lockie Mola, MD;  Location: [facility];  Service: Ophthalmology;  Laterality: Right;  Diabetic 10.42 01:13.4   COLONOSCOPY WITH PROPOFOL N/A 04/04/2019   Procedure: COLONOSCOPY WITH PROPOFOL;  Surgeon: Toledo, Boykin Nearing, MD;  Location: [facility];  Service: Gastroenterology;  Laterality: N/A;   I & D EXTREMITY Right 02/01/2020   Procedure: IRRIGATION AND DEBRIDEMENT EXTREMITY with poly exchange;  Surgeon: Donato Heinz, MD;  Location: [facility];  Service: Orthopedics;  Laterality: Right;   INCISION AND DRAINAGE     BACK-MRSA INFECTION AFTER BACK SURGERY   KNEE ARTHROPLASTY Right 01/28/2020   Procedure: COMPUTER ASSISTED TOTAL KNEE ARTHROPLASTY;  Surgeon: Donato Heinz, MD;  Location: [facility];  Service: Orthopedics;  Laterality: Right;   MOUTH SURGERY     right elbow surgery     right knee surgery     right shoulder surgery     from polio damage   TONSILLECTOMY     TOTAL HIP ARTHROPLASTY Bilateral 04/2016    Allergies  Allergies  Allergen Reactions   Bee Venom Anaphylaxis   Oxycodone Other (See Comments)    Delusions   Hydromorphone Other (See Comments)    hallucinating   Hydroxychloroquine     Other reaction(s): Other (See Comments) He broke out really badly.   Zolpidem Other (See Comments)    History of Present Illness  77 year old male with above past medical history including atrial flutter, chronic HFpEF, hypertension, hyperlipidemia, diabetes, stage III chronic kidney disease, cutaneous T-cell lymphoma, chronic low back pain, and sleep apnea on CPAP.  He was diagnosed with atrial flutter at the time of presentation for knee surgery in outside hospital in 2018.  Echocardiogram at that time showed an EF of 60-65% with LVH, and mild pulmonary hypertension (41 mmHg).  He subsequently underwent successful catheter ablation for atrial flutter in September 2018, and remained off of oral anticoagulation.  He  establish care with our office in June 2020 in the setting of preoperative risk stratification, and subsequently underwent right total knee arthroplasty with complicated postoperative course including bleeding requiring arthrotomy, evacuation of hematoma, and I&D.  In June 2023, he was admitted to the hospital with neutropenic sepsis secondary to COVID.  During admission, there was concern for possible atrial fibrillation on twelve-lead ECG, and he was placed on apixaban therapy.  Cardiology was consulted and upon our review, his rhythm was felt to be consistent with sinus rhythm and PACs, and Eliquis was discontinued.  Echocardiogram in August 2023 showed an EF of 60-65% without regional wall motion abnormalities, grade 1 diastolic dysfunction, normal RV function, and no significant valvular abnormalities.  We ordered a ZIO monitor following his hospitalization however, he never placed it.  The monitor was reordered in December 2023 however, as of his January 2024 visit, he had still yet to place it.  Unfortunately, Daniel Kline was admitted to [facility] for a week in late January due to left lower leg cellulitis and severe sepsis with altered mental status and acute kidney injury.  He was treated with antibiotics and IV fluids and did require holding of ARB therapy, with clinical improvement.  [facility].  He notes that he has since recovered well and has returned home.  He has chronic, stable and mild lower extremity edema and dyspnea on exertion.  He has been having a home health nurse check his blood pressure at home and notes that it is typically in the 120-130 range.  He is higher today at 165/92.  He denies chest pain,  palpitations, PND, orthopnea, dizziness, syncope, or early satiety.  Regarding previously ordered monitor, he wore it as prescribed and still had it on when he presented to the hospital in late January.  He says hospital staff took it off  of him and told him they would mail it however, we do not have the finalized report from I rhythm, as they have never received it.  Because it was an AT device, we are able to pull strips which do not show any clear evidence of atrial fibrillation.  Home Medications    Current Outpatient Medications  Medication Sig Dispense Refill   amLODipine (NORVASC) 5 MG tablet TAKE 1 TABLET BY MOUTH DAILY 90 tablet 3   calcium carbonate (OSCAL) 1500 (600 Ca) MG TABS tablet Take 600 mg of elemental calcium by mouth daily with breakfast.     carvedilol (COREG) 3.125 MG tablet TAKE 1 TABLET BY MOUTH 2 TIMES DAILY WITH A MEAL. 180 tablet 3   Cholecalciferol (VITAMIN D-3) 25 MCG (1000 UT) CAPS Take 1,000 Units by mouth daily.     clobetasol cream (TEMOVATE) 0.05 % Apply 1 application topically 2 (two) times daily as needed (irritation).      clorazepate (TRANXENE) 7.5 MG tablet TAKE 1 TABLET BY MOUTH TWICE A DAY AS NEEDED FOR  ANXIETY 60 tablet 0   cyanocobalamin (VITAMIN B12) 500 MCG tablet Take 1,000 mcg by mouth daily.     EPINEPHrine 0.3 mg/0.3 mL IJ SOAJ injection Inject 0.3 mLs (0.3 mg total) into the muscle as needed for anaphylaxis. 1 each 5   Ferrous Sulfate (IRON PO) Take 1 tablet by mouth at bedtime.     folic acid (FOLVITE) 1 MG tablet Take 1 mg by mouth daily. Every day except day that he takes methotrexate     furosemide (LASIX) 40 MG tablet Take 40 mg by mouth 2 (two) times daily.     gabapentin (NEURONTIN) 300 MG capsule TAKE 1 CAPSULE BY MOUTH 3 TIMES  DAILY 300 capsule 2   glucose blood (ONETOUCH ULTRA) test strip 1 each by Other route daily. Use to check blood sugar once daily 100 each 3   HYDROcodone-acetaminophen (NORCO/VICODIN) 5-325 MG tablet TAKE ONE TABLET BY MOUTH EVERY 6 HOURS AS NEEDED FOR PAIN 60 tablet 0   hydrOXYzine (ATARAX) 25 MG tablet TAKE ONE TABLET EVERY EIGHT HOURS AS NEEDED 90 tablet 3   isosorbide mononitrate (IMDUR) 60 MG 24 hr tablet Take 1 tablet (60 mg total) by mouth  daily before lunch. 90 tablet 3   liraglutide (VICTOZA) 18 MG/3ML SOPN Inject 1.2 mg into the skin daily. 1 mL 0   losartan (COZAAR) 100 MG tablet TAKE 1 TABLET BY MOUTH DAILY 90 tablet 1   MAGNESIUM PO Take 1 tablet by mouth daily.     metFORMIN (GLUCOPHAGE) 500 MG tablet Take 500 mg by mouth 2 (two) times daily with a meal.     Multiple Vitamins-Minerals (MULTIVITAL) tablet Take 1 tablet by mouth daily.     rosuvastatin (CRESTOR) 20 MG tablet TAKE 1 TABLET BY MOUTH DAILY 100 tablet 3   timolol (TIMOPTIC) 0.5 % ophthalmic solution Place 1 drop into both eyes 2 (two) times daily.     triamcinolone cream (KENALOG) 0.1 % APPLY TOPICALLY TWICE DAILY AS NEEDED 45 g 1   Upadacitinib (RINVOQ PO) Take by mouth. (Patient not taking: Reported on 11/24/2022)     No current facility-administered medications for this visit.     Review of Systems    Some degree of chronic, stable dyspnea on exertion and mild lower extremity edema.  He denies chest pain, palpitations, PND, orthopnea, dizziness, syncope, or early satiety.  All other systems reviewed and are otherwise negative except as noted above.    Physical Exam    VS:  BP (!) 165/92   Pulse 75   Ht  (1.676 m)   Wt 232 lb (105.2 kg)   BMI 37.45 kg/m  , BMI Body mass index is 37.45 kg/m.     GEN: Well nourished, well developed, in no acute distress. HEENT: normal. Neck: Supple, no JVD, carotid bruits, or masses. Cardiac: RRR, no murmurs, rubs, or gallops. No clubbing, cyanosis, 1+ bilateral lower extremity woody edema.  Radials 2+/PT 1+ and equal bilaterally.  Respiratory:  Respirations regular and unlabored, clear to auscultation bilaterally. GI: Obese, soft, nontender, nondistended, BS + x 4. MS: no deformity or atrophy. Skin: warm and dry, no rash. Neuro:  Strength and sensation are intact. Psych: Normal affect.  Accessory Clinical Findings    Lab Results  Component Value Date   WBC 11.1 (H) 09/11/2022   HGB 9.3 (L) 09/11/2022    HCT 28.4 (L) 09/11/2022   MCV 92.8 09/11/2022   PLT 265 09/11/2022   Lab Results  Component Value Date  CREATININE 1.78 (H) 09/11/2022   BUN 33 (H) 09/11/2022   NA 132 (L) 09/11/2022   K 3.8 09/11/2022   CL 104 09/11/2022   CO2 22 09/11/2022   Lab Results  Component Value Date   ALT 50 (H) 09/11/2022   AST 53 (H) 09/11/2022   ALKPHOS 64 09/11/2022   BILITOT 0.3 09/11/2022   Lab Results  Component Value Date   CHOL 100 03/10/2020   HDL 31.90 (L) 03/10/2020   LDLCALC 38 03/10/2020   LDLDIRECT 94.0 01/27/2017   TRIG 148.0 03/10/2020   CHOLHDL 3 03/10/2020    Lab Results  Component Value Date   HGBA1C 6.2 (A) 07/15/2022    Assessment & Plan    1.  Atrial flutter with prior question of paroxysmal atrial fibrillation: Status post catheter ablation for atrial flutter in 2018.  During hospitalization in June 2023, there was question of atrial fibrillation but our team evaluated and felt this was most consistent with sinus rhythm and PACs.  He finally wore an outpatient monitor in January.  The final report is not available as it appears the device was never mailed back (removed by hospital staff during hospitalization in January.  As this was a live telemetry device, I was able to evaluate available strips.  I do see some sinus rhythm and bradycardia with PACs as well as episodes of artifact.  There were no triggered events and no evidence of atrial fibrillation.  He remains on carvedilol therapy.  2.  Chronic HFpEF: Echo in August 2023 showed an EF of 65 to 65% with grade 1 diastolic dysfunction.  He has chronic, stable dyspnea on exertion as well as 1+ bilateral lower extremity woody edema.  He remains on Lasix 40 mg twice daily.  His blood pressure is elevated today however, he notes on recent home health nurse checks, this has been more normal.  He has a cuff at home and will have his wife check it daily over the next week and contact us if pressures remain elevated.  Continue  beta-blocker, ARB, and amlodipine.  3.  Essential hypertension: Pressure elevated today but as above, he notes better trends at home.  He will continue to follow at home and contact us in a week.  Currently on losartan 100 mg daily, Imdur 60 mg daily, Lasix 40 mg twice daily, carvedilol 3.125 mg twice daily, and amlodipine 5 mg daily.  With chronic lower extremity edema, would not titrate amlodipine further.  Would likely tolerate further titration of carvedilol.  4.  Hyperlipidemia: Has not had labs since 2021.  He is not fasting today.  He remains on rosuvastatin therapy.  5.  Stage III chronic kidney disease: Followed by nephrology.  He did have acute kidney injury in the setting of hospitalization for sepsis and cellulitis in January.  Follow-up basic metabolic panel today.  6.  Type 2 diabetes mellitus: A1c 6.2 in December.  He is on Victoza and metformin and followed by primary care.  7.  Disposition: Follow-up basic metabolic panel today.  Follow-up in clinic in 3 months or sooner if necessary.  Nicolasa Ducking, NP 11/24/2022, 3:24 PM

## 2022-11-24 NOTE — Patient Instructions (Signed)
Medication Instructions:  Your Physician recommend you continue on your current medication as directed.     *If you need a refill on your cardiac medications before your next appointment, please call your pharmacy*   Lab Work: Your provider would like for you to have the following labs drawn: BMET.   Please go to the [facility] entrance and check in at the front desk.  You do not need an appointment.  They are open from 7am-6 pm.   If you have labs (blood work) drawn today and your tests are completely normal, you will receive your results only by: MyChart Message (if you have MyChart) OR A paper copy in the mail If you have any lab test that is abnormal or we need to change your treatment, we will call you to review the results.   Testing/Procedures: None ordered today.    Follow-Up: At [facility], you and your health needs are our priority.  As part of our continuing mission to provide you with exceptional heart care, we have created designated Provider Care Teams.  These Care Teams include your primary Cardiologist (physician) and Advanced Practice Providers (APPs -  Physician Assistants and Nurse Practitioners) who all work together to provide you with the care you need, when you need it.  We recommend signing up for the patient portal called "MyChart".  Sign up information is provided on this After Visit Summary.  MyChart is used to connect with patients for Virtual Visits (Telemedicine).  Patients are able to view lab/test results, encounter notes, upcoming appointments, etc.  Non-urgent messages can be sent to your provider as well.   To learn more about what you can do with MyChart, go to [email or web address].    Your next appointment:   3 month(s)  Provider:   You may see Julien Nordmann, MD or one of the following Advanced Practice Providers on your designated Care Team:   Nicolasa Ducking, NP Eula Listen, PA-C Cadence Fransico Antavion, PA-C Charlsie Quest, NP

## 2022-11-25 ENCOUNTER — Other Ambulatory Visit: Payer: Self-pay | Admitting: Internal Medicine

## 2022-11-25 ENCOUNTER — Other Ambulatory Visit: Payer: Self-pay | Admitting: *Deleted

## 2022-11-25 DIAGNOSIS — I5032 Chronic diastolic (congestive) heart failure: Secondary | ICD-10-CM

## 2022-11-25 MED ORDER — FUROSEMIDE 40 MG PO TABS: 40.0000 mg | ORAL_TABLET | Freq: Every day | ORAL | 0 refills | Status: DC

## 2022-11-25 NOTE — Telephone Encounter (Signed)
Name of Medication: Hydrocodone Name of Pharmacy: [facility] Last Fill or Written Date and Quantity: 10-27-22 #60 Last Office Visit and Type: 10-25-22 Next Office Visit and Type: 01-27-23 Last Controlled Substance Agreement Date: 12-24-21 Last UDS: 12-24-21

## 2022-12-03 ENCOUNTER — Other Ambulatory Visit
Admission: RE | Admit: 2022-12-03 | Discharge: 2022-12-03 | Disposition: A | Discharge status: Home / Self Care | Payer: Medicare Other | Attending: Nurse Practitioner | Admitting: Nurse Practitioner

## 2022-12-03 DIAGNOSIS — I5032 Chronic diastolic (congestive) heart failure: Secondary | ICD-10-CM | POA: Diagnosis not present

## 2022-12-03 LAB — BASIC METABOLIC PANEL
Anion gap: 8 (ref 5–15)
BUN: 34 mg/dL — ABNORMAL HIGH (ref 8–23)
CO2: 27 mmol/L (ref 22–32)
Calcium: 9.4 mg/dL (ref 8.9–10.3)
Chloride: 101 mmol/L (ref 98–111)
Creatinine, Ser: 2.28 mg/dL — ABNORMAL HIGH (ref 0.61–1.24)
GFR, Estimated: 29 mL/min — ABNORMAL LOW (ref >60)
Glucose, Bld: 132 mg/dL — ABNORMAL HIGH (ref 70–99)
Potassium: 3.6 mmol/L (ref 3.5–5.1)
Sodium: 136 mmol/L (ref 135–145)

## 2022-12-06 DIAGNOSIS — M0609 Rheumatoid arthritis without rheumatoid factor, multiple sites: Secondary | ICD-10-CM | POA: Diagnosis not present

## 2022-12-06 DIAGNOSIS — N184 Chronic kidney disease, stage 4 (severe): Secondary | ICD-10-CM | POA: Diagnosis not present

## 2022-12-06 DIAGNOSIS — C84A0 Cutaneous T-cell lymphoma, unspecified, unspecified site: Secondary | ICD-10-CM | POA: Diagnosis not present

## 2022-12-06 DIAGNOSIS — Z7960 Long term (current) use of unspecified immunomodulators and immunosuppressants: Secondary | ICD-10-CM | POA: Diagnosis not present

## 2022-12-07 ENCOUNTER — Telehealth: Payer: Self-pay | Admitting: Pharmacist

## 2022-12-07 NOTE — Telephone Encounter (Signed)
Patient called to discuss Rinvoq. He had some questions now that he received the Rinvoq from manufacturer. Discussed autoimmune disease and immunosuppressive drugs in general, discussed importance of routine laboratory monitoring and keeping appts with rheumatology. Discussed infection prevention and sun protection. Pt appreciative of information.

## 2022-12-10 ENCOUNTER — Telehealth: Payer: Self-pay

## 2022-12-10 NOTE — Progress Notes (Signed)
Care Management & Coordination Services Pharmacy Team  Reason for Encounter: Diabetes  Contacted patient to discuss Diabetes Disease State.  {US HC Outreach:28874}  Current antihyperglycemic regimen:  Metformin 500 mg BID  Victoza 1.2 mg daily  Glipizide 5 mg -1/2 tab daily    Patient verbally confirms he is taking the above medications as directed. {yes/no:20286}  What diet changes have been made to improve diabetes control?  What recent interventions/DTPs have been made to improve glycemic control:  ***  Have there been any recent hospitalizations or ED visits since last visit with PharmD? {yes/no:20286}  Patient {reports/denies:24182} hypoglycemic symptoms, including Pale, Sweaty, Shaky, Hungry, Nervous/irritable, and Vision changes  Patient {reports/denies:24182} hyperglycemic symptoms, including blurry vision, excessive thirst, fatigue, polyuria, and weakness  How often are you checking your blood sugar? {BG Testing frequency:23922}  What are your blood sugars ranging?  Fasting: *** Before meals: *** After meals: *** Bedtime: ***  During the week, how often does your blood glucose drop below 70? {LowBGfrequency:24142}  Are you checking your feet daily/regularly? {yes/no:20286}  Adherence Review: Is the patient currently on a STATIN medication? No Is the patient currently on ACE/ARB medication? Yes Does the patient have >5 day gap between last estimated fill dates? No  Chart Updates:  Recent office visits:  None since last contact  Recent consult visits:  12/06/22 Gerrie Nordmann, MD (Rheumatology) Rheumatoid Arthritis Start: Rinvoq F/U 2 months 11/24/22 Nicolasa Ducking, NP (Cardiology) CHF Abnormal Labs: "Creatinine (measure of kidney function) is elevated and close to admission creatinine in late January.  I recommend that he reduce his furosemide to 40 mg once a day with plan for follow-up basic metabolic panel in 1 week.  He should also arrange for follow-up  with his nephrologist." Change: Reduce Furosemide to 40 mg one daily F/U 3 months  Hospital visits:  None in previous 6 months  Medications: Outpatient Encounter Medications as of 12/10/2022  Medication Sig   amLODipine (NORVASC) 5 MG tablet TAKE 1 TABLET BY MOUTH DAILY   calcium carbonate (OSCAL) 1500 (600 Ca) MG TABS tablet Take 600 mg of elemental calcium by mouth daily with breakfast.   carvedilol (COREG) 3.125 MG tablet TAKE 1 TABLET BY MOUTH 2 TIMES DAILY WITH A MEAL.   Cholecalciferol (VITAMIN D-3) 25 MCG (1000 UT) CAPS Take 1,000 Units by mouth daily.   clobetasol cream (TEMOVATE) 0.05 % Apply 1 application topically 2 (two) times daily as needed (irritation).    clorazepate (TRANXENE) 7.5 MG tablet TAKE 1 TABLET BY MOUTH TWICE A DAY AS NEEDED FOR ANXIETY   cyanocobalamin (VITAMIN B12) 500 MCG tablet Take 1,000 mcg by mouth daily.   EPINEPHrine 0.3 mg/0.3 mL IJ SOAJ injection Inject 0.3 mLs (0.3 mg total) into the muscle as needed for anaphylaxis.   Ferrous Sulfate (IRON PO) Take 1 tablet by mouth at bedtime.   folic acid (FOLVITE) 1 MG tablet Take 1 mg by mouth daily. Every day except day that he takes methotrexate   furosemide (LASIX) 40 MG tablet Take 1 tablet (40 mg total) by mouth daily.   gabapentin (NEURONTIN) 300 MG capsule TAKE 1 CAPSULE BY MOUTH 3 TIMES  DAILY   glucose blood (ONETOUCH ULTRA) test strip 1 each by Other route daily. Use to check blood sugar once daily   HYDROcodone-acetaminophen (NORCO/VICODIN) 5-325 MG tablet TAKE ONE TABLET BY MOUTH EVERY 6 HOURS AS NEEDED FOR PAIN   hydrOXYzine (ATARAX) 25 MG tablet TAKE ONE TABLET EVERY EIGHT HOURS AS NEEDED   isosorbide mononitrate (  IMDUR) 60 MG 24 hr tablet Take 1 tablet (60 mg total) by mouth daily before lunch.   liraglutide (VICTOZA) 18 MG/3ML SOPN Inject 1.2 mg into the skin daily.   losartan (COZAAR) 100 MG tablet TAKE 1 TABLET BY MOUTH DAILY   MAGNESIUM PO Take 1 tablet by mouth daily.   metFORMIN (GLUCOPHAGE)  500 MG tablet Take 500 mg by mouth 2 (two) times daily with a meal.   Multiple Vitamins-Minerals (MULTIVITAL) tablet Take 1 tablet by mouth daily.   rosuvastatin (CRESTOR) 20 MG tablet TAKE 1 TABLET BY MOUTH DAILY   timolol (TIMOPTIC) 0.5 % ophthalmic solution Place 1 drop into both eyes 2 (two) times daily.   triamcinolone cream (KENALOG) 0.1 % APPLY TOPICALLY TWICE DAILY AS NEEDED   Upadacitinib (RINVOQ PO) Take by mouth. (Patient not taking: Reported on 11/24/2022)   No facility-administered encounter medications on file as of 12/10/2022.    Recent Relevant Labs: Lab Results  Component Value Date/Time   HGBA1C 6.2 (A) 07/15/2022 04:00 PM   HGBA1C 5.8 (A) 12/24/2021 03:09 PM   HGBA1C 6.7 (H) 06/18/2021 04:12 PM   HGBA1C 7.8 (H) 01/21/2020 12:58 PM   MICROALBUR 15.3 (H) 08/05/2017 11:07 AM   MICROALBUR 46.8 (H) 07/23/2016 11:25 AM    Kidney Function Lab Results  Component Value Date/Time   CREATININE 2.28 (H) 12/03/2022 11:43 AM   CREATININE 2.24 (H) 11/24/2022 04:53 PM   CREATININE 1.27 (H) 02/09/2016 02:21 PM   CREATININE 1.20 (H) 11/28/2015 04:28 PM   GFR 41.96 (L) 02/03/2022 11:19 AM   GFRNONAA 29 (L) 12/03/2022 11:43 AM   GFRNONAA 56 (L) 02/09/2016 02:21 PM   GFRAA 43 (L) 02/01/2020 01:13 PM   GFRAA 65 02/09/2016 02:21 PM    Star Rating Drugs:  Medication:  Last Fill: Day Supply Losartan 100 mg 12/02/22 90  Care Gaps: Annual wellness visit in last year? Yes 10/25/22 Last eye exam / retinopathy screening: Up to date Last diabetic foot exam: Up to date  Al Corpus, PharmD notified  Claudina Lick, [place] Clinical Pharmacy Assistant [phone]  .use

## 2022-12-14 ENCOUNTER — Other Ambulatory Visit: Payer: Self-pay | Admitting: Internal Medicine

## 2022-12-22 ENCOUNTER — Other Ambulatory Visit: Payer: Self-pay | Admitting: Internal Medicine

## 2022-12-22 NOTE — Telephone Encounter (Signed)
Name of Medication: Hydrocodone Name of Pharmacy: [facility] Last Fill or Written Date and Quantity: 11/25/22 #60 Last Office Visit and Type: 10/25/22 Next Office Visit and Type: 01/27/23 Last Controlled Substance Agreement Date: 12/24/21 Last UDS:12/24/21

## 2022-12-24 ENCOUNTER — Other Ambulatory Visit: Payer: Self-pay

## 2022-12-24 ENCOUNTER — Telehealth: Payer: Self-pay | Admitting: Internal Medicine

## 2022-12-24 MED ORDER — AMOXICILLIN-POT CLAVULANATE 875-125 MG PO TABS: 1.0000 | ORAL_TABLET | Freq: Two times a day (BID) | ORAL | 0 refills | Status: DC

## 2022-12-24 NOTE — Telephone Encounter (Signed)
Patient's wife contacted the office regarding an issue her husband was seen for in the past, states patients right leg is red and swelling as it was before. Wants to know if Dr. Alphonsus Sias can refill something he had previously sent in? Patient believes it was a cream but is not sure. Please advise, thank you.

## 2022-12-24 NOTE — Telephone Encounter (Signed)
Spoke to [employer] ( Patient's wife) and let her know that Dr. Alphonsus Sias called in Augmentin 875 mg BID for 7 days at [facility] and that if it worsens to go to the ED or Urgent Care if it is the weekend. Patient can call early Monday morning and get an appointment if needed. [name] understands and is agreeable.

## 2022-12-27 NOTE — Telephone Encounter (Signed)
Called to speak to pt. He said his leg is doing okay. Stated he just got the antibiotic this morning. He will let us know if it does not improve.

## 2022-12-30 ENCOUNTER — Ambulatory Visit: Payer: Medicare Other | Admitting: Dermatology

## 2022-12-30 ENCOUNTER — Encounter: Payer: Self-pay | Admitting: Dermatology

## 2022-12-30 VITALS — BP 154/80 | HR 68

## 2022-12-30 DIAGNOSIS — Z7189 Other specified counseling: Secondary | ICD-10-CM

## 2022-12-30 DIAGNOSIS — Z79899 Other long term (current) drug therapy: Secondary | ICD-10-CM | POA: Diagnosis not present

## 2022-12-30 DIAGNOSIS — Z872 Personal history of diseases of the skin and subcutaneous tissue: Secondary | ICD-10-CM | POA: Diagnosis not present

## 2022-12-30 DIAGNOSIS — L578 Other skin changes due to chronic exposure to nonionizing radiation: Secondary | ICD-10-CM

## 2022-12-30 DIAGNOSIS — C84A0 Cutaneous T-cell lymphoma, unspecified, unspecified site: Secondary | ICD-10-CM

## 2022-12-30 DIAGNOSIS — X32XXXA Exposure to sunlight, initial encounter: Secondary | ICD-10-CM | POA: Diagnosis not present

## 2022-12-30 DIAGNOSIS — W908XXA Exposure to other nonionizing radiation, initial encounter: Secondary | ICD-10-CM

## 2022-12-30 MED ORDER — MOMETASONE FUROATE 0.1 % EX CREA
TOPICAL_CREAM | CUTANEOUS | 6 refills | Status: DC
Start: 2022-12-30 — End: 2023-06-17

## 2022-12-30 NOTE — Patient Instructions (Addendum)
Start mometasone cream 90 g  2 45 gram Apply to affected areas of rash 5 days a week as needed  Topical steroids (such as triamcinolone, fluocinolone, fluocinonide, mometasone, clobetasol, halobetasol, betamethasone, hydrocortisone) can cause thinning and lightening of the skin if they are used for too long in the same area. Your physician has selected the right strength medicine for your problem and area affected on the body. Please use your medication only as directed by your physician to prevent side effects.    Continue Rinvoq as prescribed by Dr. Allena Katz    Due to recent changes in healthcare laws, you may see results of your pathology and/or laboratory studies on MyChart before the doctors have had a chance to review them. We understand that in some cases there may be results that are confusing or concerning to you. Please understand that not all results are received at the same time and often the doctors may need to interpret multiple results in order to provide you with the best plan of care or course of treatment. Therefore, we ask that you please give [place] 2 business days to thoroughly review all your results before contacting the office for clarification. Should we see a critical lab result, you will be contacted sooner.   If You Need Anything After Your Visit  If you have any questions or concerns for your doctor, please call our main line at [phone] and press option 4 to reach your doctor's medical assistant. If no one answers, please leave a voicemail as directed and we will return your call as soon as possible. Messages left after 4 pm will be answered the following business day.   You may also send [place] a message via MyChart. We typically respond to MyChart messages within 1-2 business days.  For prescription refills, please ask your pharmacy to contact our office. Our fax number is [phone].  If you have an urgent issue when the clinic is closed that cannot wait until the  next business day, you can page your doctor at the number below.    Please note that while we do our best to be available for urgent issues outside of office hours, we are not available 24/7.   If you have an urgent issue and are unable to reach [place], you may choose to seek medical care at your doctor's office, retail clinic, urgent care center, or emergency room.  If you have a medical emergency, please immediately call 911 or go to the emergency department.  Pager Numbers  - Dr. Gwen Pounds: [phone]  - Dr. Neale Burly: [phone]  - Dr. Roseanne Reno: [phone]  In the event of inclement weather, please call our main line at [phone] for an update on the status of any delays or closures.  Dermatology Medication Tips: Please keep the boxes that topical medications come in in order to help keep track of the instructions about where and how to use these. Pharmacies typically print the medication instructions only on the boxes and not directly on the medication tubes.   If your medication is too expensive, please contact our office at [phone] option 4 or send [place] a message through MyChart.   We are unable to tell what your co-pay for medications will be in advance as this is different depending on your insurance coverage. However, we may be able to find a substitute medication at lower cost or fill out paperwork to get insurance to cover a needed medication.   If a prior authorization is required to  get your medication covered by your insurance company, please allow [place] 1-2 business days to complete this process.  Drug prices often vary depending on where the prescription is filled and some pharmacies may offer cheaper prices.  The website www.goodrx.com contains coupons for medications through different pharmacies. The prices here do not account for what the cost may be with help from insurance (it may be cheaper with your insurance), but the website can give you the price if you did not  use any insurance.  - You can print the associated coupon and take it with your prescription to the pharmacy.  - You may also stop by our office during regular business hours and pick up a GoodRx coupon card.  - If you need your prescription sent electronically to a different pharmacy, notify our office through [facility] or by phone at [phone] option 4.     Si Usted Necesita Algo Despus de Su Visita  Tambin puede enviarnos un mensaje a travs de Clinical cytogeneticist. Por lo general respondemos a los mensajes de MyChart en el transcurso de 1 a 2 das hbiles.  Para renovar recetas, por favor pida a su farmacia que se ponga en contacto con nuestra oficina. Annie Sable de fax es [place] [phone].  Si tiene un asunto urgente cuando la clnica est cerrada y que no puede esperar hasta el siguiente da hbil, puede llamar/localizar a su doctor(a) al nmero que aparece a continuacin.   Por favor, tenga en cuenta que aunque hacemos todo lo posible para estar disponibles para asuntos urgentes fuera del horario de [place], no estamos disponibles las 24 horas del da, los 7 [address]  [address] de la [place].   Si tiene un problema urgente y no puede comunicarse con nosotros, puede optar por buscar atencin mdica  en el consultorio de su doctor(a), en una clnica privada, en un centro de atencin urgente o en una sala de emergencias.  Si tiene Engineer, drilling, por favor llame inmediatamente al 911 o vaya a la sala de emergencias.  Nmeros de bper  - Dr. Gwen Pounds: [phone]  - Dra. Moye: [phone]  - Dra. Roseanne Reno: [phone]  En caso de inclemencias del [place], por favor llame a Lacy Duverney principal al [phone] para una actualizacin sobre el [place] de cualquier retraso o cierre.  Consejos para la medicacin en dermatologa: Por favor, guarde las cajas en las que vienen los medicamentos de uso tpico para ayudarle a seguir las instrucciones sobre dnde y cmo usarlos. Las farmacias  generalmente imprimen las instrucciones del medicamento slo en las cajas y no directamente en los tubos del [place].   Si su medicamento es muy caro, por favor, pngase en contacto con Rolm Gala llamando al [phone] y presione la opcin 4 o envenos un mensaje a travs de Clinical cytogeneticist.   No podemos decirle cul ser su copago por los medicamentos por adelantado ya que esto es diferente dependiendo de la cobertura de su seguro. Sin embargo, es posible que podamos encontrar un medicamento sustituto a Audiological scientist un formulario para que el seguro cubra el medicamento que se considera necesario.   Si se requiere una autorizacin previa para que su compaa de seguros [place] su medicamento, por favor permtanos de 1 a 2 das hbiles para completar [address]  Los precios de los medicamentos varan con frecuencia dependiendo del Environmental consultant de dnde se surte la receta y alguna farmacias pueden ofrecer precios ms baratos.  El sitio web www.goodrx.com tiene cupones para medicamentos de Health and safety inspector.  Los precios aqu no tienen en cuenta lo que podra costar con la ayuda del seguro (puede ser ms barato con su seguro), pero el sitio web puede darle el precio si no utiliz Tourist information centre manager.  - Puede imprimir el cupn correspondiente y llevarlo con su receta a la farmacia.  - Tambin puede pasar por nuestra oficina durante el horario de atencin regular y Education officer, museum una tarjeta de cupones de GoodRx.  - Si necesita que su receta se enve electrnicamente a una farmacia diferente, informe a nuestra oficina a travs de MyChart de [place] o por telfono llamando al [phone] y presione la opcin 4.

## 2022-12-30 NOTE — Progress Notes (Signed)
   Follow-Up Visit   Subjective  Daniel Kline is a 77 y.o. male who presents for the following: 6 month follow up CTCL  Patient reports he was started on Rinvoq by rheumatologist to help with psoriatic arthritis and CTCL. He has stopped methotrexate.  The patient has spots, moles and lesions to be evaluated, some may be new or changing and the patient may have concern these could be cancer.  The following portions of the chart were reviewed this encounter and updated as appropriate: medications, allergies, medical history  Review of Systems:  No other skin or systemic complaints except as noted in HPI or Assessment and Plan.  Objective  Well appearing patient in no apparent distress; mood and affect are within normal limits. A focused examination was performed of the following areas: back, chest, face, arms, legs  Relevant exam findings are noted in the Assessment and Plan.   Assessment & Plan   Cellulitis of leg recently - Hospitalized by his PCP Exam: Crusted patch at right leg Healing today.  No infection today Treatment Plan: Recently hospitalized for.  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 Condition is symptomatic / bothersome to patient. Not to goal.  Cont f/u with Dr. Corky Downs  DC Methotrexate and Folic acid by Dr. Allena Katz   Started Rinvoq by Dr. Allena Katz to help with PsA and CTCL, Patient will continue as prescribed.   DC TMC 0.1 % cream   Start mometasone 0.1 % cream apply qd up to 5d/wk aa prn flares, avoid face, groin, axilla    Topical steroids (such as triamcinolone, fluocinolone, fluocinonide, mometasone,  clobetasol, halobetasol, betamethasone, hydrocortisone) can cause thinning and lightening of the skin if they are used for too long in the same area. Your physician has selected the right strength medicine for your problem and area affected on the body. Please use your medication only as directed by your physician to prevent side effects.  ACTINIC DAMAGE - chronic, secondary to cumulative UV radiation exposure/sun exposure over time - diffuse scaly erythematous macules with underlying dyspigmentation - Recommend daily broad spectrum sunscreen SPF 30+ to sun-exposed areas, reapply every 2 hours as needed.  - Recommend staying in the shade or wearing long sleeves, sun glasses (UVA+UVB protection) and wide brim hats (4-inch brim around the entire circumference of the hat). - Call for new or changing lesions.  Return in about 6 months (around 07/02/2023) for CTCL follow up. And TBSE  I, Asher Muir, CMA, am acting as scribe for Armida Sans, MD.  Documentation: I have reviewed the above documentation for accuracy and completeness, and I agree with the above.  Armida Sans, MD

## 2023-01-11 ENCOUNTER — Encounter: Payer: Self-pay | Admitting: Dermatology

## 2023-01-18 ENCOUNTER — Ambulatory Visit: Payer: Self-pay

## 2023-01-18 NOTE — Patient Outreach (Signed)
  Care Coordination   01/18/2023 Name: Daniel Kline MRN: [id number] DOB: 09-Apr-1946   Care Coordination Outreach Attempts:  An unsuccessful telephone outreach was attempted for a scheduled appointment today.  HIPAA compliant message left with return phone number.   Follow Up Plan:  Additional outreach attempts will be made to offer the patient care coordination information and services.   Encounter Outcome:  No Answer   Care Coordination Interventions:  No, not indicated    [name] [facility] Care Coordination [phone] direct line

## 2023-01-21 ENCOUNTER — Other Ambulatory Visit: Payer: Self-pay | Admitting: Internal Medicine

## 2023-01-21 NOTE — Telephone Encounter (Signed)
Name of Medication: Hydrocodone Name of [facility]acy: [facility] Last Fill or Written Date and Quantity: 12-23-22 #60 Last Office Visit and Type: 10-25-22 Next Office Visit and Type: 01-27-23 Last Controlled Substance Agreement Date: 12-24-21 Last UDS: 12-24-21

## 2023-01-26 ENCOUNTER — Telehealth: Payer: Self-pay | Admitting: *Deleted

## 2023-01-26 NOTE — Progress Notes (Signed)
  Care Coordination Note  01/26/2023 Name: Daniel Kline MRN: [id number] DOB: 09/23/1945  Daniel Kline is a 77 y.o. year old male who is a primary care patient of [name], MD and is actively engaged with the care management team. I reached out to [name] by phone today to assist with re-scheduling a follow up visit with the RN Case Manager  Follow up plan: Telephone appointment with care management team member scheduled for: 01/28/2023  Burman Nieves, [facility] Care Coordination Care Guide Direct Dial: [phone]

## 2023-01-27 ENCOUNTER — Ambulatory Visit (INDEPENDENT_AMBULATORY_CARE_PROVIDER_SITE_OTHER): Payer: Medicare Other | Admitting: Internal Medicine

## 2023-01-27 ENCOUNTER — Encounter: Payer: Self-pay | Admitting: Internal Medicine

## 2023-01-27 VITALS — BP 138/86 | HR 72 | Temp 98.5°F | Ht 66.0 in | Wt 234.0 lb

## 2023-01-27 DIAGNOSIS — M545 Low back pain, unspecified: Secondary | ICD-10-CM | POA: Diagnosis not present

## 2023-01-27 DIAGNOSIS — E1122 Type 2 diabetes mellitus with diabetic chronic kidney disease: Secondary | ICD-10-CM

## 2023-01-27 DIAGNOSIS — Z7984 Long term (current) use of oral hypoglycemic drugs: Secondary | ICD-10-CM | POA: Diagnosis not present

## 2023-01-27 DIAGNOSIS — F1120 Opioid dependence, uncomplicated: Secondary | ICD-10-CM

## 2023-01-27 DIAGNOSIS — I10 Essential (primary) hypertension: Secondary | ICD-10-CM | POA: Diagnosis not present

## 2023-01-27 DIAGNOSIS — N1832 Chronic kidney disease, stage 3b: Secondary | ICD-10-CM | POA: Diagnosis not present

## 2023-01-27 DIAGNOSIS — G8929 Other chronic pain: Secondary | ICD-10-CM

## 2023-01-27 LAB — POCT GLYCOSYLATED HEMOGLOBIN (HGB A1C): Hemoglobin A1C: 6.7 % — AB (ref 4.0–5.6)

## 2023-01-27 MED ORDER — EPINEPHRINE 0.3 MG/0.3ML IJ SOAJ: 0.3000 mg | INTRAMUSCULAR | 5 refills | Status: AC | PRN

## 2023-01-27 NOTE — Progress Notes (Signed)
Subjective:    Patient ID: Daniel Kline, male    DOB: [birth year], 77 y.o.   MRN: [id number]  HPI Here for review of chronic pain, narcotic dependence and diabetes  Still waiting on DMV to give determination about his license Not driving yet Is getting around better---walking better  Concerned about his right leg---wound is better He wants me to be sure no ongoing infection  Continues on the hydrocodone twice a day On rinvoq for RA---Dr Allena Katz prescribes  Checks sugars most days 146 this morning---can be up or down from this No symptomatic low sugar reactions  Current Outpatient Medications on File Prior to Visit  Medication Sig Dispense Refill   amLODipine (NORVASC) 5 MG tablet TAKE 1 TABLET BY MOUTH DAILY 90 tablet 3   calcium carbonate (OSCAL) 1500 (600 Ca) MG TABS tablet Take 600 mg of elemental calcium by mouth daily with breakfast.     carvedilol (COREG) 3.125 MG tablet TAKE 1 TABLET BY MOUTH 2 TIMES DAILY WITH A MEAL. 180 tablet 3   Cholecalciferol (VITAMIN D-3) 25 MCG (1000 UT) CAPS Take 1,000 Units by mouth daily.     clobetasol cream (TEMOVATE) 0.05 % Apply 1 application topically 2 (two) times daily as needed (irritation).      clorazepate (TRANXENE) 7.5 MG tablet TAKE 1 TABLET BY MOUTH TWICE A DAY AS NEEDED FOR ANXIETY 60 tablet 0   cyanocobalamin (VITAMIN B12) 500 MCG tablet Take 1,000 mcg by mouth daily.     EPINEPHrine 0.3 mg/0.3 mL IJ SOAJ injection Inject 0.3 mLs (0.3 mg total) into the muscle as needed for anaphylaxis. 1 each 5   Ferrous Sulfate (IRON PO) Take 1 tablet by mouth at bedtime.     folic acid (FOLVITE) 1 MG tablet Take 1 mg by mouth daily. Every day except day that he takes methotrexate     furosemide (LASIX) 40 MG tablet Take 1 tablet (40 mg total) by mouth daily. 30 tablet 0   gabapentin (NEURONTIN) 300 MG capsule TAKE 1 CAPSULE BY MOUTH 3 TIMES  DAILY 300 capsule 2   glucose blood (ONETOUCH ULTRA) test strip 1 each by Other route daily. Use to  check blood sugar once daily 100 each 3   HYDROcodone-acetaminophen (NORCO/VICODIN) 5-325 MG tablet TAKE ONE TABLET BY MOUTH EVERY 6 HOURS AS NEEDED FOR PAIN 60 tablet 0   hydrOXYzine (ATARAX) 25 MG tablet TAKE ONE TABLET EVERY EIGHT HOURS AS NEEDED 90 tablet 3   isosorbide mononitrate (IMDUR) 60 MG 24 hr tablet Take 1 tablet (60 mg total) by mouth daily before lunch. 90 tablet 3   liraglutide (VICTOZA) 18 MG/3ML SOPN Inject 1.2 mg into the skin daily. 1 mL 0   losartan (COZAAR) 100 MG tablet TAKE 1 TABLET BY MOUTH DAILY 90 tablet 1   MAGNESIUM PO Take 1 tablet by mouth daily.     metFORMIN (GLUCOPHAGE) 500 MG tablet Take 500 mg by mouth 2 (two) times daily with a meal.     mometasone (ELOCON) 0.1 % cream Apply topically to affected areas of rash daily 5 days weekly as needed 90 g 6   Multiple Vitamins-Minerals (MULTIVITAL) tablet Take 1 tablet by mouth daily.     rosuvastatin (CRESTOR) 20 MG tablet TAKE 1 TABLET BY MOUTH DAILY 100 tablet 3   timolol (TIMOPTIC) 0.5 % ophthalmic solution Place 1 drop into both eyes 2 (two) times daily.     triamcinolone cream (KENALOG) 0.1 % APPLY TOPICALLY TWICE DAILY AS NEEDED 45 g  1   Upadacitinib (RINVOQ PO) Take by mouth.     No current facility-administered medications on file prior to visit.    Allergies  Allergen Reactions   Bee Venom Anaphylaxis   Oxycodone Other (See Comments)    Delusions   Hydromorphone Other (See Comments)    hallucinating   Hydroxychloroquine     Other reaction(s): Other (See Comments) He broke out really badly.   Zolpidem Other (See Comments)    Past Medical History:  Diagnosis Date   Anemia    H/O   Anxiety    Arthritis    Atrial flutter (HCC)    a. s/p post ablation in 04/2017   Chronic kidney disease    Complication of anesthesia    CTCL (cutaneous T-cell lymphoma) (HCC)    Diabetes mellitus without complication (HCC)    Diastolic dysfunction    a. 03/2022 Echo: EF 60-65%, no rwma, GrI DD, nl RV fxn.    Dysplastic nevus 12/19/2017   Right distal lat. forearm near wrist. Severe atypia, close to peripheral margin.   Dysplastic nevus 06/21/2018   Upper back right paraspinal. Severe atypia, peripheral margin involved. Excised 07/11/2018, margins free.   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 forearm. SCCis, hypertrophic.    Past Surgical History:  Procedure Laterality Date   BACK SURGERY     LUMBAR   CARDIAC ELECTROPHYSIOLOGY STUDY AND ABLATION  2019   CATARACT EXTRACTION W/PHACO Right 05/05/2022   Procedure: CATARACT EXTRACTION PHACO AND INTRAOCULAR LENS PLACEMENT (IOC) RIGHT;  Surgeon: Lockie Mola, MD;  [facility];  Service: Ophthalmology;  Laterality: Right;  Diabetic 10.42 01:13.4   COLONOSCOPY WITH PROPOFOL N/A 04/04/2019   Procedure: COLONOSCOPY WITH PROPOFOL;  Surgeon: Toledo, Boykin Nearing, MD;  Location: [facility];  Service: Gastroenterology;  Laterality: N/A;   I & D EXTREMITY Right 02/01/2020   Procedure: IRRIGATION AND DEBRIDEMENT EXTREMITY with poly exchange;  Surgeon: Donato Heinz, MD;  Location: [facility];  Service: Orthopedics;  Laterality: Right;   INCISION AND DRAINAGE     BACK-MRSA INFECTION AFTER BACK SURGERY   KNEE ARTHROPLASTY Right 01/28/2020   Procedure: COMPUTER ASSISTED TOTAL KNEE ARTHROPLASTY;  Surgeon: Donato Heinz, MD;  Location: [facility];  Service: Orthopedics;  Laterality: Right;   MOUTH SURGERY     right elbow surgery     right knee surgery     right  shoulder surgery     from polio damage   TONSILLECTOMY     TOTAL HIP ARTHROPLASTY Bilateral 04/2016    Family History  Adopted: Yes    Social History   Socioeconomic History   Marital status: Married    Spouse name: Not on file   Number of children: 0   Years of education: Not on file   Highest education level: Not on file  Occupational History   Occupation: Corporate investment banker    Comment: 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  Occupation: Engineering geologist    Comment: Retired   Occupation: Scientist, research (medical)    Comment: Retired  Tobacco Use   Smoking status: Former    Types: Cigars    Quit date: 09/23/1990    Years since quitting: 32.3    Passive exposure: Past (as a child)   Smokeless tobacco: Former    Quit date: 02/25/2006  Vaping Use   Vaping Use: Never used  Substance and Sexual Activity   Alcohol use: No    Alcohol/week: 0.0 standard drinks of alcohol   Drug use: No   Sexual activity: Yes    Partners: Female  Other Topics Concern   Not on file  Social History Narrative   Has living will   Wife is health care POA---then brother or sister   Would accept resuscitation attempts but no prolonged ventilation or tube feeds   Social Determinants of Health   Financial Resource Strain: Low Risk  (07/20/2017)   Overall Financial Resource Strain (CARDIA)    Difficulty of Paying Living Expenses: Not hard at all  Food Insecurity: No Food Insecurity (10/21/2022)   Hunger Vital Sign    Worried About Running Out of Food in the Last Year: Never true    Ran Out of Food in the Last Year: Never true  Transportation Needs: No Transportation Needs (10/21/2022)   PRAPARE - Administrator, Civil Service (Medical): No    Lack of Transportation (Non-Medical): No  Physical Activity: Not on file  Stress: Not on file  Social Connections: Unknown (07/20/2017)   Social Connection and Isolation Panel [NHANES]    Frequency of Communication with Friends and  Family: Patient declined    Frequency of Social Gatherings with Friends and Family: Patient declined    Attends Religious Services: Patient declined    Database administrator or Organizations: Patient declined    Attends Banker Meetings: Patient declined    Marital Status: Patient declined  Intimate Partner Violence: Not At Risk (09/05/2022)   Humiliation, Afraid, Rape, and Kick questionnaire    Fear of Current or Ex-Partner: No    Emotionally Abused: No    Physically Abused: No    Sexually Abused: No   Review of Systems Sleeps okay--in recliner (doesn't use CPAP now). Had to have it fixed Appetite is good    Objective:   Physical Exam Constitutional:      Appearance: Normal appearance.  Cardiovascular:     Rate and Rhythm: Normal rate and regular rhythm.     Heart sounds: No murmur heard.    No gallop.  Pulmonary:     Effort: Pulmonary effort is normal.     Breath sounds: Normal breath sounds. No wheezing or rales.  Musculoskeletal:     Cervical back: Neck supple.     Comments: 1+ non pitting calf edema--mild stasis changes without inflammation  Lymphadenopathy:     Cervical: No cervical adenopathy.  Neurological:     Mental Status: He is alert.            Assessment & Plan:

## 2023-01-27 NOTE — Assessment & Plan Note (Signed)
Lab Results  Component Value Date   HGBA1C 6.7 (A) 01/27/2023   Good control still On liraglutide 1.2mg  daily and metformin 500 bid No changes needed

## 2023-01-27 NOTE — Assessment & Plan Note (Signed)
Does okay with the hydrocodone bid Has exercises from therapist--needs to do more Still waiting determination from [place] about license

## 2023-01-27 NOTE — Assessment & Plan Note (Signed)
BP Readings from Last 3 Encounters:  01/27/23 138/86  12/30/22 (!) 154/80  11/24/22 (!) 165/92   Controlled with the losartan 100, imdur 60, carvedilol 3.125 bid, furosemide 40

## 2023-01-27 NOTE — Assessment & Plan Note (Addendum)
PDMP reivewed No concerns Contract redone Due for UDS

## 2023-01-28 ENCOUNTER — Ambulatory Visit: Payer: Self-pay

## 2023-01-28 NOTE — Patient Instructions (Signed)
Visit Information  Thank you for taking time to visit with me today. Please don't hesitate to contact me if I can be of assistance to you.   Following are the goals we discussed today:  Continue to monitor right leg for signs/ symptoms of infection. Reported noted symptoms to provider. Reminder podiatry appointment scheduled for 05/09/23 at 2:30 pm Wear well fitted shoe.   Continue to follow up with providers as recommended Take medications as prescribed.  Contact your provider for questions.   Our next appointment is by telephone on 03/11/23 at 11:30 am  Please call the care guide team at [phone] if you need to cancel or reschedule your appointment.   If you are experiencing a Mental Health or Behavioral Health Crisis or need someone to talk to, please call the Suicide and Crisis Lifeline: 988 call 1-800-273-TALK (toll free, 24 hour hotline)  The patient verbalized understanding of instructions, educational materials, and care plan provided today and agreed to receive a mailed copy of patient instructions, educational materials, and care plan.   [name] RN,BSN,CCM [facility] Care Coordination [phone] direct line

## 2023-01-28 NOTE — Patient Outreach (Signed)
  Care Coordination   Follow Up Visit Note   01/28/2023 Name: Daniel Kline MRN: [id number] DOB: [birth year]  Daniel Kline is a 77 y.o. year old male who sees Daniel Schwalbe, MD for primary care. I spoke with  Daniel Kline by phone today.  What matters to the patients health and wellness today?  Patient states he is doing well. Reports having follow up with PCP on yesterday.  He states no signs of infection to right leg.  Patient denies any changes in treatment plan.  Patient states he missed his podiatry appointment for follow up and toenail cut.  Patient denies any new issues or concern.     Goals Addressed             This Visit's Progress    Continued improvement post hospitalization       Interventions Today    Flowsheet Row Most Recent Value  Chronic Disease   Chronic disease during today's visit Other  [right leg cellulitis]  General Interventions   General Interventions Discussed/Reviewed General Interventions Reviewed, Doctor Visits, Labs  [evaluation of current treatment plan related to right leg cellulitis and patients adherence to plan as established by provider. Assessed for new/ ongoing symptoms to right leg.]  Labs --  Daniel Kline for recent CBC lab work.]  Doctor Visits Discussed/Reviewed Doctor Visits Reviewed  Daniel Kline provider appointments with patient.  Called podiatry office and scheduled patient follow up appointment. Advised patient to call podiatry office periodically for cancellations.]  Exercise Interventions   Exercise Discussed/Reviewed Physical Activity  [Encouraged patient to continue to remain physically active as tolerated due to deconditioning.]  Pharmacy Interventions   Pharmacy Dicussed/Reviewed Pharmacy Topics Reviewed  [medications reviewed and compliance discussed.  Assessed for medication adjustments/ additions]              SDOH assessments and interventions completed:  No     Care Coordination Interventions:  Yes,  provided   Follow up plan: Follow up call scheduled for 03/11/23    Encounter Outcome:  Pt. Visit Completed   Daniel Ina RN,BSN,CCM [facility] Care Coordination [phone] direct line

## 2023-01-29 ENCOUNTER — Other Ambulatory Visit: Payer: Self-pay | Admitting: Internal Medicine

## 2023-01-30 LAB — DRUG MONITORING, PANEL 8 WITH CONFIRMATION, URINE
6 Acetylmorphine: NEGATIVE ng/mL (ref <10)
Alcohol Metabolites: NEGATIVE ng/mL (ref <500)
Alphahydroxyalprazolam: NEGATIVE ng/mL (ref <25)
Alphahydroxymidazolam: NEGATIVE ng/mL (ref <50)
Alphahydroxytriazolam: NEGATIVE ng/mL (ref <50)
Aminoclonazepam: NEGATIVE ng/mL (ref <25)
Amphetamines: NEGATIVE ng/mL (ref <500)
Benzodiazepines: POSITIVE ng/mL — AB (ref <100)
Buprenorphine, Urine: NEGATIVE ng/mL (ref <5)
Cocaine Metabolite: NEGATIVE ng/mL (ref <150)
Codeine: NEGATIVE ng/mL (ref <50)
Creatinine: 30.4 mg/dL (ref >=20.0)
Hydrocodone: 207 ng/mL — ABNORMAL HIGH (ref <50)
Hydromorphone: 60 ng/mL — ABNORMAL HIGH (ref <50)
Hydroxyethylflurazepam: NEGATIVE ng/mL (ref <50)
Lorazepam: NEGATIVE ng/mL (ref <50)
MDMA: NEGATIVE ng/mL (ref <500)
Marijuana Metabolite: NEGATIVE ng/mL (ref <20)
Morphine: NEGATIVE ng/mL (ref <50)
Nordiazepam: NEGATIVE ng/mL (ref <50)
Norhydrocodone: 216 ng/mL — ABNORMAL HIGH (ref <50)
Opiates: POSITIVE ng/mL — AB (ref <100)
Oxazepam: 149 ng/mL — ABNORMAL HIGH (ref <50)
Oxidant: NEGATIVE ug/mL (ref <200)
Oxycodone: NEGATIVE ng/mL (ref <100)
Temazepam: NEGATIVE ng/mL (ref <50)
pH: 6.4 (ref 4.5–9.0)

## 2023-01-30 LAB — DM TEMPLATE

## 2023-02-08 ENCOUNTER — Encounter: Payer: Self-pay | Admitting: Pharmacist

## 2023-02-08 DIAGNOSIS — C84A0 Cutaneous T-cell lymphoma, unspecified, unspecified site: Secondary | ICD-10-CM | POA: Diagnosis not present

## 2023-02-08 DIAGNOSIS — C8400 Mycosis fungoides, unspecified site: Secondary | ICD-10-CM | POA: Diagnosis not present

## 2023-02-08 NOTE — Progress Notes (Signed)
Patient previously followed by [facility] pharmacist. Per clinical review, no pharmacist appointment needed at this time. Patient currently followed by RN Case Manager. Care guide directed to contact patient and cancel appointment and notify pharmacy team of any patient concerns.

## 2023-02-15 DIAGNOSIS — M0609 Rheumatoid arthritis without rheumatoid factor, multiple sites: Secondary | ICD-10-CM | POA: Diagnosis not present

## 2023-02-15 DIAGNOSIS — Z7960 Long term (current) use of unspecified immunomodulators and immunosuppressants: Secondary | ICD-10-CM | POA: Diagnosis not present

## 2023-02-18 ENCOUNTER — Other Ambulatory Visit: Payer: Self-pay | Admitting: Internal Medicine

## 2023-02-19 ENCOUNTER — Other Ambulatory Visit: Payer: Self-pay | Admitting: Internal Medicine

## 2023-02-21 NOTE — Telephone Encounter (Signed)
Name of Medication: Hydrocodone Name of [facility]acy: [facility] Last Fill or Written Date and Quantity: 01-21-23 #60 Last Office Visit and Type: 10-25-22 Next Office Visit and Type: 01-27-23 Last Controlled Substance Agreement Date: 12-24-21 Last UDS: 12-24-21

## 2023-02-22 ENCOUNTER — Encounter: Payer: Medicare Other | Admitting: Pharmacist

## 2023-02-22 ENCOUNTER — Telehealth: Payer: Self-pay | Admitting: Internal Medicine

## 2023-02-22 NOTE — Telephone Encounter (Signed)
Patient called in stating that he had something scheduled with Mardella Layman. He was wanting to get setup with someone else when possible. Thank you!
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
## 2023-02-28 ENCOUNTER — Encounter: Payer: Self-pay | Admitting: Cardiovascular Disease

## 2023-02-28 ENCOUNTER — Ambulatory Visit: Payer: Medicare Other | Attending: Cardiovascular Disease | Admitting: Cardiovascular Disease

## 2023-02-28 VITALS — BP 142/80 | HR 63 | Ht 66.0 in | Wt 234.4 lb

## 2023-02-28 DIAGNOSIS — I5032 Chronic diastolic (congestive) heart failure: Secondary | ICD-10-CM

## 2023-02-28 DIAGNOSIS — I483 Typical atrial flutter: Secondary | ICD-10-CM | POA: Diagnosis not present

## 2023-02-28 DIAGNOSIS — E1121 Type 2 diabetes mellitus with diabetic nephropathy: Secondary | ICD-10-CM

## 2023-02-28 DIAGNOSIS — I480 Paroxysmal atrial fibrillation: Secondary | ICD-10-CM

## 2023-02-28 DIAGNOSIS — E782 Mixed hyperlipidemia: Secondary | ICD-10-CM | POA: Diagnosis not present

## 2023-02-28 DIAGNOSIS — I10 Essential (primary) hypertension: Secondary | ICD-10-CM | POA: Diagnosis not present

## 2023-02-28 DIAGNOSIS — I499 Cardiac arrhythmia, unspecified: Secondary | ICD-10-CM

## 2023-02-28 DIAGNOSIS — N1832 Chronic kidney disease, stage 3b: Secondary | ICD-10-CM

## 2023-02-28 DIAGNOSIS — Z7984 Long term (current) use of oral hypoglycemic drugs: Secondary | ICD-10-CM

## 2023-02-28 NOTE — Patient Instructions (Signed)

## 2023-03-11 ENCOUNTER — Ambulatory Visit: Payer: Self-pay

## 2023-03-11 NOTE — Patient Outreach (Signed)
  Care Coordination   Follow Up Visit Note   03/11/2023 Name: Daniel Kline MRN: [id number] DOB: October 25, 1945  Daniel Kline is a 77 y.o. year old male who sees [name], MD for primary care. I spoke with  [name] by phone today.  What matters to the patients health and wellness today?  Patient states he is doing fine.  He reports having follow up visit with his cardiologist on 02/28/23.  He denies any treatment change from visit. He states his BP was 142/80.  Patient states right leg cellulitis healing. He states his cardiologist looked at his leg as well and was pleased with what he saw.   Patient states he continues to do weight lifting and walking for exercise.   Patient states he is not ready end care coordination services and would like to have ongoing follow up with RNCM .   Goals Addressed             This Visit's Progress    Continued improvement post hospitalization       Interventions Today    Flowsheet Row Most Recent Value  Chronic Disease   Chronic disease during today's visit Other, Congestive Heart Failure (CHF)  [right leg cellutitis]  General Interventions   General Interventions Discussed/Reviewed General Interventions Reviewed, Doctor Visits  [evaluatio of current treatment plan for HF, right leg cellulitis and patients adherence to plan as established by provider. Assessed for heart failure symptoms and status of leg cellulitis]  Doctor Visits Discussed/Reviewed Doctor Visits Reviewed  [reviewed upcoming provider visits. Advised to follow up with providers as recommended.]  Education Interventions   Education Provided Provided Education  [reviewed heart failure symptoms and signs/ symptoms of infection.  Advised to continue to monitor weight daily and record.]  Pharmacy Interventions   Pharmacy Dicussed/Reviewed Pharmacy Topics Reviewed  [medications reviewed. Medication adjustments discussed.  Advised to take medications as prescribed.  Reminded  patient of upcoming appointment with practice pharmacist.]              SDOH assessments and interventions completed:  No     Care Coordination Interventions:  Yes, provided   Follow up plan: Follow up call scheduled for 04/21/23    Encounter Outcome:  Pt. Visit Completed   [name] RN,BSN,CCM [facility] Care Coordination [phone] direct line

## 2023-03-11 NOTE — Patient Instructions (Signed)
Visit Information  Thank you for taking time to visit with me today. Please don't hesitate to contact me if I can be of assistance to you.   Following are the goals we discussed today:   Goals Addressed             This Visit's Progress    Continued improvement post hospitalization       Interventions Today    Flowsheet Row Most Recent Value  Chronic Disease   Chronic disease during today's visit Other, Congestive Heart Failure (CHF)  [right leg cellutitis]  General Interventions   General Interventions Discussed/Reviewed General Interventions Reviewed, Doctor Visits  [evaluatio of current treatment plan for HF, right leg cellulitis and patients adherence to plan as established by provider. Assessed for heart failure symptoms and status of leg cellulitis]  Doctor Visits Discussed/Reviewed Doctor Visits Reviewed  [reviewed upcoming provider visits. Advised to follow up with providers as recommended.]  Education Interventions   Education Provided Provided Education  [reviewed heart failure symptoms and signs/ symptoms of infection.  Advised to continue to monitor weight daily and record.]  Pharmacy Interventions   Pharmacy Dicussed/Reviewed Pharmacy Topics Reviewed  [medications reviewed. Medication adjustments discussed.  Advised to take medications as prescribed.  Reminded patient of upcoming appointment with practice pharmacist.]              Our next appointment is by telephone on 04/21/23 at 2:30 pm  Please call the care guide team at [phone] if you need to cancel or reschedule your appointment.   If you are experiencing a Mental Health or Behavioral Health Crisis or need someone to talk to, please call the Suicide and Crisis Lifeline: 988 call 1-800-273-TALK (toll free, 24 hour hotline)  The patient verbalized understanding of instructions, educational materials, and care plan provided today and agreed to receive a mailed copy of patient instructions, educational  materials, and care plan.   [name] RN,BSN,CCM [facility] Care Coordination [phone] direct line

## 2023-03-16 ENCOUNTER — Other Ambulatory Visit: Payer: Self-pay | Admitting: Internal Medicine

## 2023-03-21 ENCOUNTER — Other Ambulatory Visit: Payer: Self-pay | Admitting: Internal Medicine

## 2023-03-21 NOTE — Telephone Encounter (Signed)
Name of Medication: Hydrocodone Name of [facility]acy: [facility] Last Fill or Written Date and Quantity: 02-21-23 #60 Last Office Visit and Type: 01-27-23 Next Office Visit and Type: 05-02-23 Last Controlled Substance Agreement Date: 01-27-23 Last UDS: 01-27-23

## 2023-03-29 DIAGNOSIS — H903 Sensorineural hearing loss, bilateral: Secondary | ICD-10-CM | POA: Diagnosis not present

## 2023-03-29 DIAGNOSIS — H6123 Impacted cerumen, bilateral: Secondary | ICD-10-CM | POA: Diagnosis not present

## 2023-04-07 ENCOUNTER — Other Ambulatory Visit: Payer: Medicare Other | Admitting: Pharmacist

## 2023-04-07 DIAGNOSIS — E1121 Type 2 diabetes mellitus with diabetic nephropathy: Secondary | ICD-10-CM

## 2023-04-07 DIAGNOSIS — E785 Hyperlipidemia, unspecified: Secondary | ICD-10-CM

## 2023-04-07 MED ORDER — ROSUVASTATIN CALCIUM 10 MG PO TABS: 10.0000 mg | ORAL_TABLET | Freq: Every day | ORAL | 3 refills | Status: DC

## 2023-04-07 NOTE — Progress Notes (Signed)
 I have reviewed the pharmacist's encounter and agree with their documentation.   Catie Eppie Gibson, PharmD, BCACP, CPP [facility] [phone]

## 2023-04-07 NOTE — Progress Notes (Signed)
04/07/2023 Name: Daniel Kline MRN: [id number] DOB: Apr 26, 1946  Chief Complaint  Patient presents with   Medication Management    Daniel Kline is a 77 y.o. year old male who presented for a telephone visit.   They were referred to the pharmacist by their PCP for assistance in managing complex medication management.   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 [place] [address] Phone: [phone] Fax: [phone]  [facility]) - [place], [place] - [address] [facility] [address] [place] [address] [place] [address] Phone: [phone] Fax: [phone]  [id number] [place], [place] - [address] [place] [address] Phone: [phone] Fax: [phone]  [facility] - [place], [place] - [address] [place]  [address] Phone: [phone] Fax: [phone]   Patient reports affordability concerns with their medications: No  Patient reports access/transportation concerns to their pharmacy: No  - [facility] Patient reports adherence concerns with their medications:  No  Pt is unsure what each medicine is for, but reviewed medication bottles.  Denies missed doses.   Diabetes:  Current medications: Victoza 1.2 mg subcutaneous daily, metformin 500 mg BID Medications tried in the past: GI upset on Victoza 1.8 mg daily.  Current glucose readings: Usually FBG ~125 before eating.  Using glucose meter; testing once daily most days of the week  Patient denies hypoglycemic s/sx including dizziness, shakiness, sweating. Patient denies hyperglycemic symptoms  including polyuria, polydipsia, polyphagia, nocturia, neuropathy, blurred vision.  Hypertension:  Current medications: amlodipine 5 mg, carvedilol 3.125 mg BID, losartan 100 mg daily, furosemide 40 mg daily   Furosemide has helped with swelling  Patient has a validated, automated, upper arm home BP cuff Current blood pressure readings readings: cannot recall recent readings - wife helps him check, but has not been checking recently.   Patient denies hypotensive s/sx including dizziness, lightheadedness.  Patient denies hypertensive symptoms including headache, chest pain, shortness of breath   Medication Management:  Patient went through all medication bottles. He is in charge of managing his medications. He does not use a pill box, he takes them out of the bottles. He denies missed doses.    Objective:  Lab Results  Component Value Date   HGBA1C 6.7 (A) 01/27/2023    Lab Results  Component Value Date   CREATININE 2.28 (H) 12/03/2022   BUN 34 (H) 12/03/2022   NA 136 12/03/2022   K 3.6 12/03/2022   CL 101 12/03/2022   CO2 27 12/03/2022    Lab Results  Component Value Date   CHOL 100 03/10/2020   HDL 31.90 (L) 03/10/2020   LDLCALC 38 03/10/2020   LDLDIRECT 94.0 01/27/2017   TRIG 148.0 03/10/2020   CHOLHDL 3 03/10/2020    Medications Reviewed Today     Reviewed by Particia Lather, RPH (Pharmacist) on 04/07/23 at 1342  Med List Status: <None>   Medication Order Taking? Sig Documenting Provider Last Dose Status Informant  amLODipine (NORVASC) 5 MG tablet [id number] Yes TAKE 1 TABLET BY MOUTH DAILY Alphonsus Sias, Richard I, MD Taking Active   calcium carbonate (OSCAL) 1500 (600 Ca) MG TABS tablet  [id number] Yes Take 600 mg of elemental calcium by mouth daily with breakfast. [provider] Taking Active   carvedilol (COREG) 3.125 MG tablet [id number] Yes TAKE 1 TABLET BY MOUTH 2 TIMES DAILY WITH A MEAL. [name], MD Taking Active   Cholecalciferol (VITAMIN D-3)  25 MCG (1000 UT) CAPS [id number] Yes Take 1,000 Units by mouth daily. [provider] Taking Active Self  clobetasol cream (TEMOVATE) 0.05 % [id number]  Apply 1 application topically 2 (two) times daily as needed (irritation).  [provider]  Active Self, Pharmacy Records           Med Note Alphonzo Dublin   Tue Jan 30, 2019 11:25 AM)    clorazepate (TRANXENE) 7.5 MG tablet [id number] Yes TAKE 1 TABLET BY MOUTH TWICE A DAY AS NEEDED FOR ANXIETY [name], MD Taking Active   cyanocobalamin (VITAMIN B12) 500 MCG tablet [id number] Yes Take 1,000 mcg by mouth daily. [provider] Taking Active   EPINEPHrine 0.3 mg/0.3 mL IJ SOAJ injection [id number]  Inject 0.3 mg into the muscle as needed for anaphylaxis. [name], MD  Active   Ferrous Sulfate (IRON PO) [id number] Yes Take 1 tablet by mouth at bedtime. [provider] Taking Active Self, Pharmacy Records           Med Note Abelino Derrick May 11, 2021  2:15 PM)    folic acid (FOLVITE) 1 MG tablet [id number] Yes Take 1 mg by mouth daily. Every day except day that he takes methotrexate [provider] Taking Active Self, Pharmacy Records  furosemide (LASIX) 40 MG tablet [id number] Yes Take 1 tablet (40 mg total) by mouth daily. Creig Hines, NP Taking Active   gabapentin (NEURONTIN) 300 MG capsule [id number] Yes TAKE 1 CAPSULE BY MOUTH 3 TIMES  DAILY [name], MD Taking Active   glucose blood (ONETOUCH ULTRA) test strip [id number]  1 each by Other route daily. Use to check blood sugar once daily [name], MD  Active   HYDROcodone-acetaminophen (NORCO/VICODIN) 5-325 MG tablet [id number] Yes TAKE ONE TABLET BY MOUTH EVERY 6 HOURS AS NEEDED FOR PAIN [name], MD Taking Active   hydrOXYzine (ATARAX) 25 MG tablet [id number] Yes TAKE ONE TABLET EVERY EIGHT HOURS AS NEEDED [name], MD Taking Active            Med Note Particia Lather   Thu Apr 07, 2023   1:13 PM) Takes once/day for itching  isosorbide mononitrate (IMDUR) 60 MG 24 hr tablet [id number] Yes Take 1 tablet (60 mg total) by mouth daily before lunch. Antonieta Iba, MD Taking Active Self, Pharmacy Records  liraglutide [facility]) 18 MG/3ML SOPN [id number] Yes Inject 1.2 mg into the skin daily. [name], MD Taking Active Self, Pharmacy Records           Med Note Lenice Llamas, Garry Heater   Fri Nov 19, 2022 11:44 AM)    losartan (COZAAR) 100 MG tablet [id number] Yes TAKE 1 TABLET BY MOUTH DAILY Antonieta Iba, MD Taking Active   MAGNESIUM PO [id number] Yes Take 1 tablet by mouth daily. [provider] Taking Active Self, Pharmacy Records  metFORMIN (GLUCOPHAGE) 500 MG tablet [id number] Yes Take 500 mg by mouth 2 (two) times daily with a meal. [provider] Taking Active   methotrexate (RHEUMATREX) 2.5 MG tablet [id number] Yes Take 2.5 mg by mouth once a week. [provider] Taking Active  Med Note Particia Lather   Thu Apr 07, 2023  1:18 PM) Patient reports that he takes 2 tablets weekly.  mometasone (ELOCON) 0.1 % cream [id number]  Apply topically to affected areas of rash daily 5 days weekly as needed Deirdre Evener, MD  Active   Multiple Vitamins-Minerals [facility]) tablet [id number] Yes Take 1 tablet by mouth daily. [provider] Taking Active Self, Pharmacy Records  timolol (TIMOPTIC) 0.5 % ophthalmic solution [id number] Yes Place 1 drop into both eyes 2 (two) times daily. [provider] Taking Active Self, Pharmacy Records  triamcinolone cream (KENALOG) 0.1 % [id number]  APPLY TOPICALLY TWICE DAILY AS NEEDED [name], MD  Active   Upadacitinib (RINVOQ PO) [id number] No Take by mouth.  Patient not taking: Reported on 04/07/2023   [provider] Not Taking Active             Assessment/Plan:   Medication Management: - Conducted thorough medication review with patient today. Given recent decline in  kidney function with Cr 2.7 and eGFR 23 mL/min on recent BMP ([facility]), recommend to stop metformin and reduce dose of rosuvastatin. Suspect that pt DM will remain well controlled on liraglutide 1.2 mg daily - but educated pt to reach out if fasting blood sugars become elevated consistently >130 mg/dL. [facility] has not had recent lipid panel, but last LDL-C was 38 mg/dL on rosuvastatin 20, so expect LDL will remain within goal on dose adjusted rosuvastatin 10 mg. Pt preferred to take 10 mg tablet rather than cut 20 mg tablet in half.  - STOP metformin - STOP rosuvastatin 20 mg daily. START rosuvastatin 10 mg daily.  - Should assess blood pressure, A1c, and lipid panel at PCP f/u in ~ 4 weeks. Did not identify further need for pharmacist follow-up, but encouraged patient to reach out if needed.   Follow Up Plan: PCP 05/02/23  Nils Pyle, PharmD PGY1 Pharmacy Resident

## 2023-04-19 ENCOUNTER — Other Ambulatory Visit: Payer: Self-pay | Admitting: Internal Medicine

## 2023-04-20 ENCOUNTER — Other Ambulatory Visit: Payer: Self-pay | Admitting: Internal Medicine

## 2023-04-20 NOTE — Telephone Encounter (Signed)
Last filled 02-21-23 #60 Last OV 01-27-23 Next OV 05-02-23 [facility]

## 2023-04-20 NOTE — Telephone Encounter (Signed)
Name of Medication: Hydrocodone Name of Pharmacy: [facility] Last Fill or Written Date and Quantity: 03-21-23 #60 Last Office Visit and Type: 01-27-23 Next Office Visit and Type: 05-02-23 Last Controlled Substance Agreement Date: 01-27-23 Last UDS: 01-27-23

## 2023-04-21 ENCOUNTER — Other Ambulatory Visit: Payer: Self-pay | Admitting: Internal Medicine

## 2023-04-21 ENCOUNTER — Ambulatory Visit: Payer: Self-pay

## 2023-04-21 ENCOUNTER — Other Ambulatory Visit: Payer: Self-pay | Admitting: Cardiovascular Disease

## 2023-04-21 DIAGNOSIS — I10 Essential (primary) hypertension: Secondary | ICD-10-CM

## 2023-04-21 NOTE — Patient Outreach (Signed)
  Care Coordination   Follow Up Visit Note   04/21/2023 Name: Daniel Kline MRN: [id number] DOB: 06-19-1946  Daniel Kline is a 77 y.o. year old male who sees [name], MD for primary care. I spoke with  [name] by phone today.  What matters to the patients health and wellness today?  Patient states he is doing well. He reports left leg cellulitis has resolved / healed.  Patient denies heart failure symptoms.  He states he continues to exercise by walking and doing some weigh lifting.  Patient states he continues to adhere to a low sodium diet and takes his medications as prescribed.  Patient request ongoing care coordination follow up every 2 months.     Goals Addressed             This Visit's Progress    COMPLETED: Continued improvement post hospitalization       Interventions Today    Flowsheet Row Most Recent Value  Chronic Disease   Chronic disease during today's visit Other, Congestive Heart Failure (CHF)  [right leg cellutitis]  General Interventions   General Interventions Discussed/Reviewed General Interventions Reviewed, Doctor Visits  [evaluatio of current treatment plan for HF, right leg cellulitis and patients adherence to plan as established by provider. Assessed for heart failure symptoms and status of leg cellulitis]  Doctor Visits Discussed/Reviewed Doctor Visits Reviewed  [reviewed upcoming provider visits. Advised to follow up with providers as recommended.]  Education Interventions   Education Provided Provided Education  [reviewed heart failure symptoms and signs/ symptoms of infection.  Advised to continue to monitor weight daily and record.]  Pharmacy Interventions   Pharmacy Dicussed/Reviewed Pharmacy Topics Reviewed  [medications reviewed. Medication adjustments discussed.  Advised to take medications as prescribed.  Reminded patient of upcoming appointment with practice pharmacist.]           Management of health conditions        Interventions Today    Flowsheet Row Most Recent Value  Chronic Disease   Chronic disease during today's visit Congestive Heart Failure (CHF), Other  [right leg celluitis]  General Interventions   General Interventions Discussed/Reviewed General Interventions Reviewed, Doctor Visits  [evaluation of current treatment plan for HF/ leg cellulitis and patients adherence to plan as established  provider.  Assessed for heart failure ad leg cellulitis symptoms]  Doctor Visits Discussed/Reviewed Doctor Visits Reviewed  [reviewed upcoming provide appointments.  Advised to keep follow up appoimtment with providers]  Exercise Interventions   Exercise Discussed/Reviewed Physical Activity  [encouraged patient to remain physically active exercising at least 3 days per week time as tolerated]  Education Interventions   Education Provided Provided Education  [Advised patient to start back weighing daily and recording.  Reviewed heart failure symptoms/ action plan.  Advised to notify provider of heart failure syptoms when nocticed and call 911 for severe symptoms.]  Nutrition Interventions   Nutrition Discussed/Reviewed Decreasing salt, Nutrition Reviewed  Pharmacy Interventions   Pharmacy Dicussed/Reviewed Pharmacy Topics Reviewed  [medications reviewed and importance of compliance with medications discussed.]              SDOH assessments and interventions completed:  No     Care Coordination Interventions:  Yes, provided   Follow up plan: Follow up call scheduled for 07/05/23    Encounter Outcome:  Patient Visit Completed   [name] RN,BSN,CCM [facility] Care Coordination [phone] direct line

## 2023-04-21 NOTE — Patient Instructions (Signed)
Visit Information  Thank you for taking time to visit with me today. Please don't hesitate to contact me if I can be of assistance to you.   Following are the goals we discussed today:   Goals Addressed             This Visit's Progress    COMPLETED: Continued improvement post hospitalization       Interventions Today    Flowsheet Row Most Recent Value  Chronic Disease   Chronic disease during today's visit Other, Congestive Heart Failure (CHF)  [right leg cellutitis]  General Interventions   General Interventions Discussed/Reviewed General Interventions Reviewed, Doctor Visits  [evaluatio of current treatment plan for HF, right leg cellulitis and patients adherence to plan as established by provider. Assessed for heart failure symptoms and status of leg cellulitis]  Doctor Visits Discussed/Reviewed Doctor Visits Reviewed  [reviewed upcoming provider visits. Advised to follow up with providers as recommended.]  Education Interventions   Education Provided Provided Education  [reviewed heart failure symptoms and signs/ symptoms of infection.  Advised to continue to monitor weight daily and record.]  Pharmacy Interventions   Pharmacy Dicussed/Reviewed Pharmacy Topics Reviewed  [medications reviewed. Medication adjustments discussed.  Advised to take medications as prescribed.  Reminded patient of upcoming appointment with practice pharmacist.]           Management of health conditions       Interventions Today    Flowsheet Row Most Recent Value  Chronic Disease   Chronic disease during today's visit Congestive Heart Failure (CHF), Other  [right leg celluitis]  General Interventions   General Interventions Discussed/Reviewed General Interventions Reviewed, Doctor Visits  [evaluation of current treatment plan for HF/ leg cellulitis and patients adherence to plan as established  provider.  Assessed for heart failure ad leg cellulitis symptoms]  Doctor Visits Discussed/Reviewed Doctor  Visits Reviewed  [reviewed upcoming provide appointments.  Advised to keep follow up appoimtment with providers]  Exercise Interventions   Exercise Discussed/Reviewed Physical Activity  [encouraged patient to remain physically active exercising at least 3 days per week time as tolerated]  Education Interventions   Education Provided Provided Education  [Advised patient to start back weighing daily and recording.  Reviewed heart failure symptoms/ action plan.  Advised to notify provider of heart failure syptoms when nocticed and call 911 for severe symptoms.]  Nutrition Interventions   Nutrition Discussed/Reviewed Decreasing salt, Nutrition Reviewed  Pharmacy Interventions   Pharmacy Dicussed/Reviewed Pharmacy Topics Reviewed  [medications reviewed and importance of compliance with medications discussed.]              Our next appointment is by telephone on 07/05/23 at 2 pm  Please call the care guide team at [phone] if you need to cancel or reschedule your appointment.   If you are experiencing a Mental Health or Behavioral Health Crisis or need someone to talk to, please call the Suicide and Crisis Lifeline: 988 call 1-800-273-TALK (toll free, 24 hour hotline)  The patient verbalized understanding of instructions, educational materials, and care plan provided today and agreed to receive a mailed copy of patient instructions, educational materials, and care plan.   [name] RN,BSN,CCM [facility] Care Coordination [phone] direct line

## 2023-05-02 ENCOUNTER — Encounter: Payer: Self-pay | Admitting: Internal Medicine

## 2023-05-02 ENCOUNTER — Ambulatory Visit (INDEPENDENT_AMBULATORY_CARE_PROVIDER_SITE_OTHER): Payer: Medicare Other | Admitting: Internal Medicine

## 2023-05-02 VITALS — BP 138/88 | HR 63 | Temp 97.6°F | Ht 66.0 in | Wt 245.0 lb

## 2023-05-02 DIAGNOSIS — F1120 Opioid dependence, uncomplicated: Secondary | ICD-10-CM | POA: Diagnosis not present

## 2023-05-02 DIAGNOSIS — M0600 Rheumatoid arthritis without rheumatoid factor, unspecified site: Secondary | ICD-10-CM | POA: Diagnosis not present

## 2023-05-02 DIAGNOSIS — Z23 Encounter for immunization: Secondary | ICD-10-CM

## 2023-05-02 DIAGNOSIS — G894 Chronic pain syndrome: Secondary | ICD-10-CM

## 2023-05-02 NOTE — Assessment & Plan Note (Signed)
PDMP reviewed No concerns

## 2023-05-02 NOTE — Addendum Note (Signed)
Addended by: Tillman Abide I on: 05/02/2023 04:14 PM   Modules accepted: Level of Service

## 2023-05-02 NOTE — Progress Notes (Signed)
Subjective:    Patient ID: Daniel Kline, male    DOB: [birth year], 77 y.o.   MRN: [id number]  HPI Here for follow up of chronic pain and narcotic dependence  Doing fine No new concerns  RA still controlled---taken off rinvoq Back on just methotrexate 5mg   Still using the hydrocodone--- 1-2 daily Mostly for right shoulder  Checks sugars --fairly regular Usually 125 in morning  Current Outpatient Medications on File Prior to Visit  Medication Sig Dispense Refill   amLODipine (NORVASC) 5 MG tablet TAKE 1 TABLET BY MOUTH DAILY 90 tablet 3   calcium carbonate (OSCAL) 1500 (600 Ca) MG TABS tablet Take 600 mg of elemental calcium by mouth daily with breakfast.     carvedilol (COREG) 3.125 MG tablet TAKE 1 TABLET BY MOUTH 2 TIMES DAILY WITH A MEAL. 180 tablet 3   Cholecalciferol (VITAMIN D-3) 25 MCG (1000 UT) CAPS Take 1,000 Units by mouth daily.     clobetasol cream (TEMOVATE) 0.05 % Apply 1 application topically 2 (two) times daily as needed (irritation).      clorazepate (TRANXENE) 7.5 MG tablet TAKE 1 TABLET BY MOUTH TWICE A DAY AS NEEDED FOR ANXIETY 60 tablet 0   cyanocobalamin (VITAMIN B12) 500 MCG tablet Take 1,000 mcg by mouth daily.     EPINEPHrine 0.3 mg/0.3 mL IJ SOAJ injection Inject 0.3 mg into the muscle as needed for anaphylaxis. 1 each 5   Ferrous Sulfate (IRON PO) Take 1 tablet by mouth at bedtime.     folic acid (FOLVITE) 1 MG tablet Take 1 mg by mouth daily. Every day except day that he takes methotrexate     furosemide (LASIX) 40 MG tablet Take 1 tablet (40 mg total) by mouth daily. 30 tablet 0   gabapentin (NEURONTIN) 300 MG capsule TAKE 1 CAPSULE BY MOUTH 3 TIMES  DAILY 300 capsule 3   glucose blood (ONETOUCH ULTRA) test strip 1 each by Other route daily. Use to check blood sugar once daily 100 each 3   HYDROcodone-acetaminophen (NORCO/VICODIN) 5-325 MG tablet TAKE ONE TABLET BY MOUTH EVERY 6 HOURS AS NEEDED FOR PAIN 60 tablet 0   hydrOXYzine (ATARAX) 25 MG  tablet TAKE ONE TABLET EVERY EIGHT HOURS AS NEEDED 90 tablet 3   isosorbide mononitrate (IMDUR) 60 MG 24 hr tablet Take 1 tablet (60 mg total) by mouth daily before lunch. 90 tablet 3   liraglutide (VICTOZA) 18 MG/3ML SOPN Inject 1.2 mg into the skin daily. 1 mL 0   losartan (COZAAR) 100 MG tablet TAKE 1 TABLET BY MOUTH DAILY 90 tablet 3   MAGNESIUM PO Take 1 tablet by mouth daily.     methotrexate (RHEUMATREX) 2.5 MG tablet Take 2.5 mg by mouth once a week.     mometasone (ELOCON) 0.1 % cream Apply topically to affected areas of rash daily 5 days weekly as needed 90 g 6   Multiple Vitamins-Minerals (MULTIVITAL) tablet Take 1 tablet by mouth daily.     rosuvastatin (CRESTOR) 10 MG tablet Take 1 tablet (10 mg total) by mouth daily. 90 tablet 3   timolol (TIMOPTIC) 0.5 % ophthalmic solution Place 1 drop into both eyes 2 (two) times daily.     triamcinolone cream (KENALOG) 0.1 % APPLY TOPICALLY TWICE DAILY AS NEEDED 45 g 1   No current facility-administered medications on file prior to visit.    Allergies  Allergen Reactions   Bee Venom Anaphylaxis   Oxycodone Other (See Comments)    Delusions  Hydromorphone Other (See Comments)    hallucinating   Hydroxychloroquine     Other reaction(s): Other (See Comments) He broke out really badly.   Zolpidem Other (See Comments)    Past Medical History:  Diagnosis Date   Anemia    H/O   Anxiety    Arthritis    Atrial flutter (HCC)    a. s/p post ablation in 04/2017   Chronic kidney disease    Complication of anesthesia    CTCL (cutaneous T-cell lymphoma) (HCC)    Diabetes mellitus without complication (HCC)    Diastolic dysfunction    a. 03/2022 Echo: EF 60-65%, no rwma, GrI DD, nl RV fxn.   Dysplastic nevus 12/19/2017   Right distal lat. forearm near wrist. Severe atypia, close to peripheral margin.   Dysplastic nevus 06/21/2018   Upper back right paraspinal. Severe atypia, peripheral margin involved. Excised 07/11/2018, margins free.    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 forearm. SCCis, hypertrophic.    Past Surgical History:  Procedure Laterality Date   BACK SURGERY     LUMBAR   CARDIAC ELECTROPHYSIOLOGY STUDY AND ABLATION  2019   CATARACT EXTRACTION W/PHACO Right 05/05/2022   Procedure: CATARACT EXTRACTION PHACO AND INTRAOCULAR LENS PLACEMENT (IOC) RIGHT;  Surgeon: Lockie Mola, MD;  Location: [facility];  Service: Ophthalmology;  Laterality: Right;  Diabetic 10.42 01:13.4   COLONOSCOPY WITH PROPOFOL N/A 04/04/2019   Procedure: COLONOSCOPY WITH PROPOFOL;  Surgeon: Toledo, Boykin Nearing, MD;  Location: [facility];  Service: Gastroenterology;  Laterality: N/A;   I & D EXTREMITY Right 02/01/2020   Procedure: IRRIGATION AND DEBRIDEMENT EXTREMITY with poly exchange;  Surgeon: Donato Heinz, MD;  Location: [facility];  Service: Orthopedics;  Laterality: Right;   INCISION AND DRAINAGE     BACK-MRSA INFECTION AFTER BACK SURGERY   KNEE ARTHROPLASTY Right 01/28/2020   Procedure: COMPUTER ASSISTED TOTAL KNEE ARTHROPLASTY;  Surgeon: Donato Heinz, MD;  Location: [facility];  Service: Orthopedics;  Laterality: Right;   MOUTH SURGERY     right elbow surgery     right knee surgery     right shoulder surgery     from polio damage   TONSILLECTOMY     TOTAL HIP ARTHROPLASTY Bilateral 04/2016    Family History  Adopted: Yes    Social History   Socioeconomic History   Marital status: Married    Spouse name: Not on file   Number of  children: 0   Years of education: Not on file   Highest education level: Not on file  Occupational History   Occupation: Corporate investment banker    Comment: 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   Occupation: Engineering geologist    Comment: Retired   Occupation: Scientist, research (medical)    Comment: Retired  Tobacco Use   Smoking status: Former    Types: Cigars    Quit date: 09/23/1990    Years since quitting: 32.6  Passive exposure: Past (as a child)   Smokeless tobacco: Former    Quit date: 02/25/2006  Vaping Use   Vaping status: Never Used  Substance and Sexual Activity   Alcohol use: No    Alcohol/week: 0.0 standard drinks of alcohol   Drug use: No   Sexual activity: Yes    Partners: Female  Other Topics Concern   Not on file  Social History Narrative   Has living will   Wife is health care POA---then brother or sister   Would accept resuscitation attempts but no prolonged ventilation or tube feeds   Social Determinants of Health   Financial Resource Strain: Low Risk  (07/20/2017)   Overall Financial Resource Strain (CARDIA)    Difficulty of Paying Living Expenses: Not hard at all  Food Insecurity: No Food Insecurity (10/21/2022)   Hunger Vital Sign    Worried About Running Out of Food in the Last Year: Never true    Ran Out of Food in the Last Year: Never true  Transportation Needs: No Transportation Needs (10/21/2022)   PRAPARE - Administrator, Civil Service (Medical): No    Lack of Transportation (Non-Medical): No  Physical Activity: Not on file  Stress: Not on file  Social Connections: Unknown (07/20/2017)   Social Connection and Isolation Panel [NHANES]    Frequency of Communication with Friends and Family: Patient declined    Frequency of Social Gatherings with Friends and Family: Patient declined    Attends Religious Services: Patient declined    Database administrator or Organizations: Patient declined    Attends Banker Meetings:  Patient declined    Marital Status: Patient declined  Intimate Partner Violence: Not At Risk (09/05/2022)   Humiliation, Afraid, Rape, and Kick questionnaire    Fear of Current or Ex-Partner: No    Emotionally Abused: No    Physically Abused: No    Sexually Abused: No   Review of Systems Weight down 5# Plans to restart going to gym--is walking some Bowels are fine     Objective:   Physical Exam Constitutional:      Appearance: Normal appearance.  Musculoskeletal:     Comments: Demonstrated proper lunge and wall sit techniques  Neurological:     Mental Status: He is alert.            Assessment & Plan:

## 2023-05-02 NOTE — Assessment & Plan Note (Signed)
Now just on the methotrexate 5mg  weekly Folic acid 5 days per week

## 2023-05-02 NOTE — Assessment & Plan Note (Signed)
Mostly right shoulder Hydrocodone 1-2 daily

## 2023-05-09 DIAGNOSIS — E1142 Type 2 diabetes mellitus with diabetic polyneuropathy: Secondary | ICD-10-CM | POA: Diagnosis not present

## 2023-05-09 DIAGNOSIS — B351 Tinea unguium: Secondary | ICD-10-CM | POA: Diagnosis not present

## 2023-05-09 DIAGNOSIS — L851 Acquired keratosis [keratoderma] palmaris et plantaris: Secondary | ICD-10-CM | POA: Diagnosis not present

## 2023-05-19 ENCOUNTER — Other Ambulatory Visit: Payer: Self-pay | Admitting: Internal Medicine

## 2023-05-19 NOTE — Telephone Encounter (Signed)
Name of Medication: Hydrocodone Name of [facility]acy: [facility] Last Fill or Written Date and Quantity: 04-20-23 #60 Last Office Visit and Type: 01-27-23 Next Office Visit and Type: 05-02-23 Last Controlled Substance Agreement Date: 01-27-23 Last UDS: 01-27-23

## 2023-05-26 DIAGNOSIS — N184 Chronic kidney disease, stage 4 (severe): Secondary | ICD-10-CM | POA: Diagnosis not present

## 2023-05-26 DIAGNOSIS — M0609 Rheumatoid arthritis without rheumatoid factor, multiple sites: Secondary | ICD-10-CM | POA: Diagnosis not present

## 2023-05-26 DIAGNOSIS — Z79899 Other long term (current) drug therapy: Secondary | ICD-10-CM | POA: Diagnosis not present

## 2023-06-03 DIAGNOSIS — E119 Type 2 diabetes mellitus without complications: Secondary | ICD-10-CM | POA: Diagnosis not present

## 2023-06-03 DIAGNOSIS — H401131 Primary open-angle glaucoma, bilateral, mild stage: Secondary | ICD-10-CM | POA: Diagnosis not present

## 2023-06-03 DIAGNOSIS — H2512 Age-related nuclear cataract, left eye: Secondary | ICD-10-CM | POA: Diagnosis not present

## 2023-06-03 LAB — HM DIABETES EYE EXAM

## 2023-06-09 DIAGNOSIS — Z96651 Presence of right artificial knee joint: Secondary | ICD-10-CM | POA: Diagnosis not present

## 2023-06-09 DIAGNOSIS — I739 Peripheral vascular disease, unspecified: Secondary | ICD-10-CM | POA: Diagnosis not present

## 2023-06-14 DIAGNOSIS — N184 Chronic kidney disease, stage 4 (severe): Secondary | ICD-10-CM | POA: Diagnosis not present

## 2023-06-14 DIAGNOSIS — R809 Proteinuria, unspecified: Secondary | ICD-10-CM | POA: Diagnosis not present

## 2023-06-14 DIAGNOSIS — I129 Hypertensive chronic kidney disease with stage 1 through stage 4 chronic kidney disease, or unspecified chronic kidney disease: Secondary | ICD-10-CM | POA: Diagnosis not present

## 2023-06-14 DIAGNOSIS — N2581 Secondary hyperparathyroidism of renal origin: Secondary | ICD-10-CM | POA: Diagnosis not present

## 2023-06-14 DIAGNOSIS — E785 Hyperlipidemia, unspecified: Secondary | ICD-10-CM | POA: Diagnosis not present

## 2023-06-14 DIAGNOSIS — E1122 Type 2 diabetes mellitus with diabetic chronic kidney disease: Secondary | ICD-10-CM | POA: Diagnosis not present

## 2023-06-14 DIAGNOSIS — N2889 Other specified disorders of kidney and ureter: Secondary | ICD-10-CM | POA: Diagnosis not present

## 2023-06-14 DIAGNOSIS — D631 Anemia in chronic kidney disease: Secondary | ICD-10-CM | POA: Diagnosis not present

## 2023-06-15 ENCOUNTER — Other Ambulatory Visit: Payer: Self-pay | Admitting: Internal Medicine

## 2023-06-15 ENCOUNTER — Telehealth: Payer: Self-pay | Admitting: Internal Medicine

## 2023-06-15 NOTE — Telephone Encounter (Signed)
I spoke with pts wife; starting on 06/13/23 pt has redness size of grapefruit on rt lower leg and feels warm to touch with slight swelling. No drainage noted and no pain. No CP or SOB. No available appts at [facility] or [facility] and pt did not want to go to [place]. Pt is going to [facility] or [facility]. Sending note to Dr Alphonsus Sias who is out of office, Dr Milinda Antis who is in office and [facility] pool.

## 2023-06-15 NOTE — Telephone Encounter (Signed)
Patient wife Tyler Aas called in and stated that patient leg is red and hot to the touch. She stated that he had this problem before and Dr. Alphonsus Sias sent him in something. She would like a call back to discuss this. Thank you!

## 2023-06-16 ENCOUNTER — Ambulatory Visit: Payer: Medicare Other | Admitting: Internal Medicine

## 2023-06-16 ENCOUNTER — Telehealth: Payer: Self-pay | Admitting: Internal Medicine

## 2023-06-16 NOTE — Telephone Encounter (Signed)
He had an appt here today, but it was canceled.

## 2023-06-16 NOTE — Telephone Encounter (Signed)
Pt's wife, Corrie Dandy, called stating the pt had EMS come by & check his BP & blood sugar. Corrie Dandy stated the pt's sugar was 140 & his BP was high (couldn't provide exact #s). R/s pt's appt with Dr. Alphonsus Sias for tomorrow, 11/8 @ 10:15am. Corrie Dandy requested a call back from [facility] Call back # [phone]

## 2023-06-16 NOTE — Telephone Encounter (Signed)
Daniel Kline   Telephone Encounter Signed   Creation Time: 06/16/2023 10:38 AM   Signed     Pt's wife, Daniel Kline, called stating the pt had EMS come by & check his BP & blood sugar. Daniel Kline stated the pt's sugar was 140 & his BP was high (couldn't provide exact #s). R/s pt's appt with Dr. Alphonsus Sias for tomorrow, 11/8 @ 10:15am. Daniel Kline requested a call back from [facility]. Call back # [phone]

## 2023-06-16 NOTE — Telephone Encounter (Signed)
Added this information to the message already open about this issue.

## 2023-06-16 NOTE — Telephone Encounter (Signed)
He has an appt today

## 2023-06-16 NOTE — Telephone Encounter (Signed)
Spoke to pt's wife. She said he will not eat and legs are no better. He told her not to call EMS. I advised her to tell him I and Dr Alphonsus Sias said if he does not go somewhere to be seen, that she is going to have to call EMS.   I will check back later.

## 2023-06-16 NOTE — Telephone Encounter (Signed)
[name], MD  to Me  Eual Fines, [facility]     06/15/23  1:51 PM Check on him in the morning---I will fit him in on Thursday if he hasn't been seen    Unable to reach by phone and left v/m asking return call to office with update. Sending note to [place] pool.

## 2023-06-16 NOTE — Telephone Encounter (Signed)
Spoke to pt's wife. She repeated the message below. York Spaniel he was doing better. Will keep appt tomorrow.

## 2023-06-17 ENCOUNTER — Ambulatory Visit (INDEPENDENT_AMBULATORY_CARE_PROVIDER_SITE_OTHER): Payer: Medicare Other | Admitting: Internal Medicine

## 2023-06-17 ENCOUNTER — Encounter: Payer: Self-pay | Admitting: Internal Medicine

## 2023-06-17 VITALS — BP 136/88 | HR 75 | Temp 98.5°F | Ht 66.0 in | Wt 243.0 lb

## 2023-06-17 DIAGNOSIS — L97901 Non-pressure chronic ulcer of unspecified part of unspecified lower leg limited to breakdown of skin: Secondary | ICD-10-CM

## 2023-06-17 DIAGNOSIS — I872 Venous insufficiency (chronic) (peripheral): Secondary | ICD-10-CM

## 2023-06-17 DIAGNOSIS — L03115 Cellulitis of right lower limb: Secondary | ICD-10-CM | POA: Diagnosis not present

## 2023-06-17 MED ORDER — AMOXICILLIN-POT CLAVULANATE 875-125 MG PO TABS: 1.0000 | ORAL_TABLET | Freq: Two times a day (BID) | ORAL | 0 refills | Status: DC

## 2023-06-17 MED ORDER — HYDROCORTISONE 2.5 % EX CREA: TOPICAL_CREAM | Freq: Three times a day (TID) | CUTANEOUS | 3 refills | Status: AC | PRN

## 2023-06-17 NOTE — Progress Notes (Signed)
Subjective:    Patient ID: Daniel Kline, male    DOB: Jul 29, 1946, 77 y.o.   MRN: [id number]  HPI Here due to right leg redness and concern for recurrent infection  Having a lot of itching in right leg Wife concerned---it is getting red again Some open spots No fever No discharge--just some blood  Washing with soap and water Putting triple antibiotic ointment on it--but ran out  Does have appt coming up with dermatologist  Current Outpatient Medications on File Prior to Visit  Medication Sig Dispense Refill   amLODipine (NORVASC) 5 MG tablet TAKE 1 TABLET BY MOUTH DAILY 90 tablet 3   calcium carbonate (OSCAL) 1500 (600 Ca) MG TABS tablet Take 600 mg of elemental calcium by mouth daily with breakfast.     carvedilol (COREG) 3.125 MG tablet TAKE 1 TABLET BY MOUTH 2 TIMES DAILY WITH A MEAL. 180 tablet 3   Cholecalciferol (VITAMIN D-3) 25 MCG (1000 UT) CAPS Take 1,000 Units by mouth daily.     clobetasol cream (TEMOVATE) 0.05 % Apply 1 application topically 2 (two) times daily as needed (irritation).      clorazepate (TRANXENE) 7.5 MG tablet TAKE 1 TABLET BY MOUTH TWICE A DAY AS NEEDED FOR ANXIETY 60 tablet 0   cyanocobalamin (VITAMIN B12) 500 MCG tablet Take 1,000 mcg by mouth daily.     EPINEPHrine 0.3 mg/0.3 mL IJ SOAJ injection Inject 0.3 mg into the muscle as needed for anaphylaxis. 1 each 5   Ferrous Sulfate (IRON PO) Take 1 tablet by mouth at bedtime.     folic acid (FOLVITE) 1 MG tablet Take 1 mg by mouth daily. Every day except day that he takes methotrexate     furosemide (LASIX) 40 MG tablet Take 1 tablet (40 mg total) by mouth daily. 30 tablet 0   gabapentin (NEURONTIN) 300 MG capsule TAKE 1 CAPSULE BY MOUTH 3 TIMES  DAILY 300 capsule 3   glucose blood (ONETOUCH ULTRA) test strip 1 each by Other route daily. Use to check blood sugar once daily 100 each 3   HYDROcodone-acetaminophen (NORCO/VICODIN) 5-325 MG tablet TAKE ONE TABLET BY MOUTH EVERY 6 HOURS AS NEEDED FOR PAIN  60 tablet 0   hydrOXYzine (ATARAX) 25 MG tablet TAKE ONE TABLET EVERY EIGHT HOURS AS NEEDED 90 tablet 3   isosorbide mononitrate (IMDUR) 60 MG 24 hr tablet Take 1 tablet (60 mg total) by mouth daily before lunch. 90 tablet 3   liraglutide (VICTOZA) 18 MG/3ML SOPN Inject 1.2 mg into the skin daily. 1 mL 0   losartan (COZAAR) 100 MG tablet TAKE 1 TABLET BY MOUTH DAILY 90 tablet 3   MAGNESIUM PO Take 1 tablet by mouth daily.     methotrexate (RHEUMATREX) 2.5 MG tablet Take 2.5 mg by mouth once a week.     mometasone (ELOCON) 0.1 % cream Apply topically to affected areas of rash daily 5 days weekly as needed 90 g 6   Multiple Vitamins-Minerals (MULTIVITAL) tablet Take 1 tablet by mouth daily.     rosuvastatin (CRESTOR) 10 MG tablet Take 1 tablet (10 mg total) by mouth daily. 90 tablet 3   timolol (TIMOPTIC) 0.5 % ophthalmic solution Place 1 drop into both eyes 2 (two) times daily.     triamcinolone cream (KENALOG) 0.1 % APPLY TOPICALLY TWICE DAILY AS NEEDED 45 g 1   No current facility-administered medications on file prior to visit.    Allergies  Allergen Reactions   Bee Venom Anaphylaxis  Oxycodone Other (See Comments)    Delusions   Hydromorphone Other (See Comments)    hallucinating   Hydroxychloroquine     Other reaction(s): Other (See Comments) He broke out really badly.   Zolpidem Other (See Comments)    Past Medical History:  Diagnosis Date   Anemia    H/O   Anxiety    Arthritis    Atrial flutter (HCC)    a. s/p post ablation in 04/2017   Chronic kidney disease    Complication of anesthesia    CTCL (cutaneous T-cell lymphoma) (HCC)    Diabetes mellitus without complication (HCC)    Diastolic dysfunction    a. 03/2022 Echo: EF 60-65%, no rwma, GrI DD, nl RV fxn.   Dysplastic nevus 12/19/2017   Right distal lat. forearm near wrist. Severe atypia, close to peripheral margin.   Dysplastic nevus 06/21/2018   Upper back right paraspinal. Severe atypia, peripheral margin  involved. Excised 07/11/2018, margins free.   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 forearm. SCCis, hypertrophic.    Past Surgical History:  Procedure Laterality Date   BACK SURGERY     LUMBAR   CARDIAC ELECTROPHYSIOLOGY STUDY AND ABLATION  2019   CATARACT EXTRACTION W/PHACO Right 05/05/2022   Procedure: CATARACT EXTRACTION PHACO AND INTRAOCULAR LENS PLACEMENT (IOC) RIGHT;  Surgeon: Lockie Mola, MD;  Location: [facility];  Service: Ophthalmology;  Laterality: Right;  Diabetic 10.42 01:13.4   COLONOSCOPY WITH PROPOFOL N/A 04/04/2019   Procedure: COLONOSCOPY WITH PROPOFOL;  Surgeon: Toledo, Boykin Nearing, MD;  Location: [facility];  Service: Gastroenterology;  Laterality: N/A;   I & D EXTREMITY Right 02/01/2020   Procedure: IRRIGATION AND DEBRIDEMENT EXTREMITY with poly exchange;  Surgeon: Donato Heinz, MD;  Location: [facility];  Service: Orthopedics;  Laterality: Right;   INCISION AND DRAINAGE     BACK-MRSA INFECTION AFTER BACK SURGERY   KNEE ARTHROPLASTY Right 01/28/2020   Procedure: COMPUTER ASSISTED TOTAL KNEE ARTHROPLASTY;  Surgeon: Donato Heinz, MD;  Location: [facility];  Service: Orthopedics;  Laterality: Right;   MOUTH SURGERY     right elbow surgery     right knee surgery     right shoulder surgery     from polio damage   TONSILLECTOMY     TOTAL HIP ARTHROPLASTY Bilateral 04/2016    Family History  Adopted: Yes    Social History   Socioeconomic History   Marital status:  Married    Spouse name: Not on file   Number of children: 0   Years of education: Not on file   Highest education level: Not on file  Occupational History   Occupation: Corporate investment banker    Comment: 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   Occupation: Engineering geologist    Comment: Retired   Occupation: Scientist, research (medical)    Comment: Retired  Tobacco Use   Smoking status: Former    Types: Software engineer  Quit date: 09/23/1990    Years since quitting: 32.7    Passive exposure: Past (as a child)   Smokeless tobacco: Former    Quit date: 02/25/2006  Vaping Use   Vaping status: Never Used  Substance and Sexual Activity   Alcohol use: No    Alcohol/week: 0.0 standard drinks of alcohol   Drug use: No   Sexual activity: Yes    Partners: Female  Other Topics Concern   Not on file  Social History Narrative   Has living will   Wife is health care POA---then brother or sister   Would accept resuscitation attempts but no prolonged ventilation or tube feeds   Social Determinants of Health   Financial Resource Strain: Low Risk  (07/20/2017)   Overall Financial Resource Strain (CARDIA)    Difficulty of Paying Living Expenses: Not hard at all  Food Insecurity: No Food Insecurity (10/21/2022)   Hunger Vital Sign    Worried About Running Out of Food in the Last Year: Never true    Ran Out of Food in the Last Year: Never true  Transportation Needs: No Transportation Needs (10/21/2022)   PRAPARE - Administrator, Civil Service (Medical): No    Lack of Transportation (Non-Medical): No  Physical Activity: Not on file  Stress: Not on file  Social Connections: Unknown (07/20/2017)   Social Connection and Isolation Panel [NHANES]    Frequency of Communication with Friends and Family: Patient declined    Frequency of Social Gatherings with Friends and Family: Patient declined    Attends Religious Services: Patient declined    Database administrator or Organizations: Patient  declined    Attends Banker Meetings: Patient declined    Marital Status: Patient declined  Intimate Partner Violence: Not At Risk (09/05/2022)   Humiliation, Afraid, Rape, and Kick questionnaire    Fear of Current or Ex-Partner: No    Emotionally Abused: No    Physically Abused: No    Sexually Abused: No   Review of Systems No N/V No headaches     Objective:   Physical Exam Musculoskeletal:     Comments: 2+ tense edema in calves  Skin:    Comments: Excoriations along lateral right calf--with redness and warmth surrounding            Assessment & Plan:

## 2023-06-17 NOTE — Assessment & Plan Note (Signed)
Will use the augmentin 875 bid x 7 days

## 2023-06-17 NOTE — Assessment & Plan Note (Signed)
Discussed cutting out all salt Continue the furosemide 40mg  daily

## 2023-06-17 NOTE — Assessment & Plan Note (Signed)
Due to scratching ---related to his edema I think Will give HC 2.5% for use to when it itches

## 2023-06-20 ENCOUNTER — Other Ambulatory Visit: Payer: Self-pay | Admitting: Internal Medicine

## 2023-06-20 NOTE — Telephone Encounter (Signed)
Name of Medication: Hydrocodone Name of [facility]acy: [facility] Last Fill or Written Date and Quantity: 05-19-23 #60 Last Office Visit and Type: 05-02-23 Next Office Visit and Type: 10-31-22 Last Controlled Substance Agreement Date: 01-27-23 Last UDS: 01-27-23

## 2023-06-20 NOTE — Telephone Encounter (Signed)
Spoke to pt's wife. Advised her to let us know by 11-13 or 11-14 if he stops having improvement.

## 2023-06-20 NOTE — Telephone Encounter (Signed)
Pt called to let Dr. Alphonsus Sias know that his leg is doing better & the redness is going away. Pt states he believes his leg is clearing up. Pt requested a call back from [facility]. Call back # [phone]

## 2023-06-21 ENCOUNTER — Telehealth: Payer: Self-pay | Admitting: Internal Medicine

## 2023-06-21 ENCOUNTER — Other Ambulatory Visit: Payer: Self-pay | Admitting: Internal Medicine

## 2023-06-21 NOTE — Telephone Encounter (Signed)
Patient is needing PA for medication liraglutide (VICTOZA) 18 MG/3ML SOPN , please advise thank you

## 2023-06-24 ENCOUNTER — Other Ambulatory Visit (HOSPITAL_COMMUNITY): Payer: Self-pay

## 2023-06-24 ENCOUNTER — Telehealth: Payer: Self-pay

## 2023-06-24 NOTE — Telephone Encounter (Signed)
PA request has been Submitted. New Encounter created for follow up. For additional info see Pharmacy Prior Auth telephone encounter from 06/24/23.

## 2023-06-24 NOTE — Telephone Encounter (Signed)
Pharmacy Patient Advocate Encounter   Received notification from Pt Calls Messages that prior authorization for Victoza is required/requested.   Insurance verification completed.   The patient is insured through [facility] .   Per test claim: PA required; PA submitted to above mentioned insurance via CoverMyMeds Key/confirmation #/EOC [id number] Status is pending

## 2023-06-24 NOTE — Telephone Encounter (Signed)
Pharmacy Patient Advocate Encounter  Received notification from [facility] that Prior Authorization for Verdis Prime has been DENIED.  Full denial letter will be uploaded to the media tab. See denial reason below.   PA #/Case ID/Reference #: [id number]   DENIAL REASON:   (1) You need to try three (3) of these covered drugs: (a) Bydureon BCise or Byetta*. (b) Ozempic*. (c) Trulicity*. (2) OR your doctor needs to give [place] specific medical reasons why three (3) of the covered drug(s) are not appropriate for you.

## 2023-06-27 NOTE — Telephone Encounter (Signed)
Left message on VM asking them to let us know if he ever started taking the Victoza.

## 2023-06-30 ENCOUNTER — Encounter: Payer: Self-pay | Admitting: Dermatology

## 2023-06-30 ENCOUNTER — Ambulatory Visit: Payer: Medicare Other | Admitting: Dermatology

## 2023-06-30 DIAGNOSIS — B353 Tinea pedis: Secondary | ICD-10-CM | POA: Diagnosis not present

## 2023-06-30 DIAGNOSIS — B351 Tinea unguium: Secondary | ICD-10-CM | POA: Diagnosis not present

## 2023-06-30 DIAGNOSIS — L814 Other melanin hyperpigmentation: Secondary | ICD-10-CM | POA: Diagnosis not present

## 2023-06-30 DIAGNOSIS — Z85828 Personal history of other malignant neoplasm of skin: Secondary | ICD-10-CM | POA: Diagnosis not present

## 2023-06-30 DIAGNOSIS — W908XXA Exposure to other nonionizing radiation, initial encounter: Secondary | ICD-10-CM

## 2023-06-30 DIAGNOSIS — L821 Other seborrheic keratosis: Secondary | ICD-10-CM | POA: Diagnosis not present

## 2023-06-30 DIAGNOSIS — Z86018 Personal history of other benign neoplasm: Secondary | ICD-10-CM

## 2023-06-30 DIAGNOSIS — L570 Actinic keratosis: Secondary | ICD-10-CM | POA: Diagnosis not present

## 2023-06-30 DIAGNOSIS — L219 Seborrheic dermatitis, unspecified: Secondary | ICD-10-CM

## 2023-06-30 DIAGNOSIS — L578 Other skin changes due to chronic exposure to nonionizing radiation: Secondary | ICD-10-CM | POA: Diagnosis not present

## 2023-06-30 DIAGNOSIS — Z1283 Encounter for screening for malignant neoplasm of skin: Secondary | ICD-10-CM | POA: Diagnosis not present

## 2023-06-30 DIAGNOSIS — D1801 Hemangioma of skin and subcutaneous tissue: Secondary | ICD-10-CM

## 2023-06-30 DIAGNOSIS — C84A0 Cutaneous T-cell lymphoma, unspecified, unspecified site: Secondary | ICD-10-CM

## 2023-06-30 DIAGNOSIS — Z8589 Personal history of malignant neoplasm of other organs and systems: Secondary | ICD-10-CM

## 2023-06-30 DIAGNOSIS — D229 Melanocytic nevi, unspecified: Secondary | ICD-10-CM

## 2023-06-30 DIAGNOSIS — Z7189 Other specified counseling: Secondary | ICD-10-CM

## 2023-06-30 DIAGNOSIS — Z79899 Other long term (current) drug therapy: Secondary | ICD-10-CM

## 2023-06-30 MED ORDER — FLUCONAZOLE 200 MG PO TABS
200.0000 mg | ORAL_TABLET | ORAL | 0 refills | Status: AC
Start: 2023-06-30 — End: 2023-07-22

## 2023-06-30 MED ORDER — KETOCONAZOLE 2 % EX CREA: 1.0000 | TOPICAL_CREAM | Freq: Every evening | CUTANEOUS | 5 refills | Status: DC

## 2023-06-30 NOTE — Progress Notes (Signed)
Follow-Up Visit   Subjective  Daniel Kline is a 77 y.o. male who presents for the following: Skin Cancer Screening and Full Body Skin Exam  The patient presents for Total-Body Skin Exam (TBSE) for skin cancer screening and mole check. The patient has spots, moles and lesions to be evaluated, some may be new or changing and the patient may have concern these could be cancer.  Patient with hx of SCC, dysplastic nevi. Patient currently using HC prescribed by his PCP for rash from scratching at lower legs, improved.   The following portions of the chart were reviewed this encounter and updated as appropriate: medications, allergies, medical history  Review of Systems:  No other skin or systemic complaints except as noted in HPI or Assessment and Plan.  Objective  Well appearing patient in no apparent distress; mood and affect are within normal limits.  A full examination was performed including scalp, head, eyes, ears, nose, lips, neck, chest, axillae, abdomen, back, buttocks, bilateral upper extremities, bilateral lower extremities, hands, feet, fingers, toes, fingernails, and toenails. All findings within normal limits unless otherwise noted below.   Relevant physical exam findings are noted in the Assessment and Plan.  Scalp x 1 Erythematous thin papules/macules with gritty scale.     Assessment & Plan   SKIN CANCER SCREENING PERFORMED TODAY.  ACTINIC DAMAGE - Chronic condition, secondary to cumulative UV/sun exposure - diffuse scaly erythematous macules with underlying dyspigmentation - Recommend daily broad spectrum sunscreen SPF 30+ to sun-exposed areas, reapply every 2 hours as needed.  - Staying in the shade or wearing long sleeves, sun glasses (UVA+UVB protection) and wide brim hats (4-inch brim around the entire circumference of the hat) are also recommended for sun protection.  - Call for new or changing lesions.  LENTIGINES, SEBORRHEIC KERATOSES, HEMANGIOMAS -  Benign normal skin lesions - Benign-appearing - Call for any changes  MELANOCYTIC NEVI - Tan-brown and/or pink-flesh-colored symmetric macules and papules - Benign appearing on exam today - Observation - Call clinic for new or changing moles - Recommend daily use of broad spectrum spf 30+ sunscreen to sun-exposed areas.   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 Condition is symptomatic / bothersome to patient. Not to goal.   Patient has restarted Methotrexate and Folic acid per Dr. Junita Push at [facility] dermatology   History of Dysplastic Nevi - No evidence of recurrence today - Recommend regular full body skin exams - Recommend daily broad spectrum sunscreen SPF 30+ to sun-exposed areas, reapply every 2 hours as needed.  - Call if any new or changing lesions are noted between office visits  - R distal lat forearm, upper back right paraspinal   History of Squamous Cell Carcinoma of the Skin - No evidence of recurrence today - No lymphadenopathy - Recommend regular full body skin exams - Recommend daily broad spectrum sunscreen SPF 30+ to sun-exposed areas, reapply every 2 hours as needed.  - Call if any new or changing lesions are noted between office visits - R mid lat forearm  AK (actinic keratosis) Scalp x 1  Actinic keratoses are precancerous spots that appear secondary to cumulative UV radiation exposure/sun exposure over time. They are chronic with expected duration over 1 year. A portion of actinic keratoses will progress to squamous cell carcinoma of the skin. It is not  possible to reliably predict which spots will progress to skin cancer and so treatment is recommended to prevent development of skin cancer.  Recommend daily broad spectrum sunscreen SPF 30+ to sun-exposed  areas, reapply every 2 hours as needed.  Recommend staying in the shade or wearing long sleeves, sun glasses (UVA+UVB protection) and wide brim hats (4-inch brim around the entire circumference of the hat). Call for new or changing lesions.   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 Condition is symptomatic/ bothersome to patient. Not currently at goal. Seborrheic Dermatitis is a chronic persistent rash characterized by pinkness and scaling most commonly of the mid face but also can occur on the scalp (dandruff), ears; mid chest, mid back and groin.  It tends to be exacerbated by stress and cooler weather.  People who have neurologic disease may experience new onset or exacerbation of existing seborrheic dermatitis.  The condition is not curable but treatable and can be controlled.  Treatment Plan: Start ketoconazole 2% cr at bedtime to affected areas at eyebrows and scalp.   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 Chronic and persistent condition with duration or expected duration over one year. Condition is symptomatic / bothersome to patient. Not to goal. Treatment Plan: Start fluconazole 200 mg to take once weekly x 4 weeks.   ONYCHOMYCOSIS Exam: Thickened toenails with subungal debris c/w onychomycosis Chronic and persistent condition with duration or expected duration over one year. Condition is symptomatic/ bothersome to patient. Not currently at goal. Treatment Plan: Start fluconazole 200 mg to take once weekly x  4 weeks.   Return in about 6 months (around 12/28/2023) for tinea follow up.  Anise Salvo, RMA, am acting as scribe for Armida Sans, MD .  Documentation: I have reviewed the above documentation for accuracy and completeness, and I agree with the above.  Armida Sans, MD

## 2023-06-30 NOTE — Patient Instructions (Addendum)
Treatment Plan: Start ketoconazole 2% cream at bedtime to affected areas at eyebrows and scalp.   Treatment Plan: Start fluconazole 200 mg to take once weekly x 4 weeks.  In 1 month when you are done taking the fluconazole, please call the office and let us know if you did ok taking them.   Melanoma ABCDEs  Melanoma is the most dangerous type of skin cancer, and is the leading cause of death from skin disease.  You are more likely to develop melanoma if you: Have light-colored skin, light-colored eyes, or red or blond hair Spend a lot of time in the sun Tan regularly, either outdoors or in a tanning bed Have had blistering sunburns, especially during childhood Have a close family member who has had a melanoma Have atypical moles or large birthmarks  Early detection of melanoma is key since treatment is typically straightforward and cure rates are extremely high if we catch it early.   The first sign of melanoma is often a change in a mole or a new dark spot.  The ABCDE system is a way of remembering the signs of melanoma.  A for asymmetry:  The two halves do not match. B for border:  The edges of the growth are irregular. C for color:  A mixture of colors are present instead of an even brown color. D for diameter:  Melanomas are usually (but not always) greater than 6mm - the size of a pencil eraser. E for evolution:  The spot keeps changing in size, shape, and color.  Please check your skin once per month between visits. You can use a small mirror in front and a large mirror behind you to keep an eye on the back side or your body.   If you see any new or changing lesions before your next follow-up, please call to schedule a visit.  Please continue daily skin protection including broad spectrum sunscreen SPF 30+ to sun-exposed areas, reapplying every 2 hours as needed when you're outdoors.    Due to recent changes in healthcare laws, you may see results of your pathology and/or  laboratory studies on MyChart before the doctors have had a chance to review them. We understand that in some cases there may be results that are confusing or concerning to you. Please understand that not all results are received at the same time and often the doctors may need to interpret multiple results in order to provide you with the best plan of care or course of treatment. Therefore, we ask that you please give [place] 2 business days to thoroughly review all your results before contacting the office for clarification. Should we see a critical lab result, you will be contacted sooner.   If You Need Anything After Your Visit  If you have any questions or concerns for your doctor, please call our main line at [phone] and press option 4 to reach your doctor's medical assistant. If no one answers, please leave a voicemail as directed and we will return your call as soon as possible. Messages left after 4 pm will be answered the following business day.   You may also send [place] a message via MyChart. We typically respond to MyChart messages within 1-2 business days.  For prescription refills, please ask your pharmacy to contact our office. Our fax number is [phone].  If you have an urgent issue when the clinic is closed that cannot wait until the next business day, you can page your doctor at the  number below.    Please note that while we do our best to be available for urgent issues outside of office hours, we are not available 24/7.   If you have an urgent issue and are unable to reach [place], you may choose to seek medical care at your doctor's office, retail clinic, urgent care center, or emergency room.  If you have a medical emergency, please immediately call 911 or go to the emergency department.  Pager Numbers  - Dr. Gwen Pounds: [phone]  - Dr. Roseanne Reno: [phone]  - Dr. Katrinka Blazing: [phone]   In the event of inclement weather, please call our main line at [phone] for an  update on the status of any delays or closures.  Dermatology Medication Tips: Please keep the boxes that topical medications come in in order to help keep track of the instructions about where and how to use these. Pharmacies typically print the medication instructions only on the boxes and not directly on the medication tubes.   If your medication is too expensive, please contact our office at [phone] option 4 or send [place] a message through MyChart.   We are unable to tell what your co-pay for medications will be in advance as this is different depending on your insurance coverage. However, we may be able to find a substitute medication at lower cost or fill out paperwork to get insurance to cover a needed medication.   If a prior authorization is required to get your medication covered by your insurance company, please allow [place] 1-2 business days to complete this process.  Drug prices often vary depending on where the prescription is filled and some pharmacies may offer cheaper prices.  The website www.goodrx.com contains coupons for medications through different pharmacies. The prices here do not account for what the cost may be with help from insurance (it may be cheaper with your insurance), but the website can give you the price if you did not use any insurance.  - You can print the associated coupon and take it with your prescription to the pharmacy.  - You may also stop by our office during regular business hours and pick up a GoodRx coupon card.  - If you need your prescription sent electronically to a different pharmacy, notify our office through [facility] or by phone at [phone] option 4.

## 2023-07-01 MED ORDER — OZEMPIC (0.25 OR 0.5 MG/DOSE) 2 MG/3ML ~~LOC~~ SOPN: 0.2500 mg | PEN_INJECTOR | SUBCUTANEOUS | 0 refills | Status: DC

## 2023-07-01 NOTE — Telephone Encounter (Signed)
Wife called back stating he did not start Victoza becaue it was not covered by insurance. She said to send in the Ozempic to see if that was covered. I sent in a month supply.

## 2023-07-01 NOTE — Telephone Encounter (Signed)
Left message with details asking them to contact us about starting Ozempic since Victoza is not covered.

## 2023-07-01 NOTE — Addendum Note (Signed)
Addended by: Eual Fines on: 07/01/2023 04:27 PM   Modules accepted: Orders

## 2023-07-04 DIAGNOSIS — H2512 Age-related nuclear cataract, left eye: Secondary | ICD-10-CM | POA: Diagnosis not present

## 2023-07-04 DIAGNOSIS — H401131 Primary open-angle glaucoma, bilateral, mild stage: Secondary | ICD-10-CM | POA: Diagnosis not present

## 2023-07-05 ENCOUNTER — Telehealth: Payer: Self-pay

## 2023-07-05 ENCOUNTER — Ambulatory Visit: Payer: Self-pay

## 2023-07-05 NOTE — Patient Outreach (Signed)
  Care Coordination   07/05/2023 Name: Daniel Kline MRN: [id number] DOB: Jan 20, 1946   Care Coordination Outreach Attempts:  An unsuccessful telephone outreach was attempted for a scheduled appointment today. ATtempted call to listed home number.   HIPAA compliant message left with return call phone number.  Attempted call to listed mobile number.  Unable to reach or leave message due to voice mail not being set up.   Follow Up Plan:  Additional outreach attempts will be made to offer the patient care coordination information and services.   Encounter Outcome:  No Answer   Care Coordination Interventions:  No, not indicated    [name] RN,BSN,CCM [facility]  [facility] [place] coordinator / Case Manager Phone: [phone]

## 2023-07-06 NOTE — Patient Instructions (Signed)
Visit Information  Thank you for taking time to visit with me today. Please don't hesitate to contact me if I can be of assistance to you.   Following are the goals we discussed today:   Goals Addressed             This Visit's Progress    Management of health conditions       Interventions Today    Flowsheet Row Most Recent Value  Chronic Disease   Chronic disease during today's visit Congestive Heart Failure (CHF), Other  [right leg redness/ swelling]  General Interventions   General Interventions Discussed/Reviewed General Interventions Reviewed, Doctor Visits  [evaluation of current treatment plan for mentioned health for HF/ right leg redness/ swelling and patients adherence to plan as established by provider.  Assessed for HF / right leg symptoms.]  Doctor Visits Discussed/Reviewed Doctor Visits Reviewed  [name] upcoming provider visits. Advised to keep follow up visits with providers as recommended.]  Education Interventions   Education Provided Provided Education  Provided Verbal Education On When to see the doctor  [Advised to monitor right leg symptoms and report increased / non healing symptoms to provider.]  Nutrition Interventions   Nutrition Discussed/Reviewed Decreasing salt, Nutrition Reviewed  Pharmacy Interventions   Pharmacy Dicussed/Reviewed Pharmacy Topics Reviewed  [Reviewed medications.  Patient advised to take antibiotic until completed. Confirmed patient applying prescribed creams to right leg. Assessed for ongoing leg itching and/ or swelling.]              Our next appointment is by telephone on 07/25/23 at 2:30 pm  Please call the care guide team at [phone] if you need to cancel or reschedule your appointment.   If you are experiencing a Mental Health or Behavioral Health Crisis or need someone to talk to, please call the Suicide and Crisis Lifeline: 988 call 1-800-273-TALK (toll free, 24 hour hotline)  The patient verbalized  understanding of instructions, educational materials, and care plan provided today and agreed to receive a mailed copy of patient instructions, educational materials, and care plan.   [name] RN,BSN,CCM [place]  [facility], [facility] coordinator / Case Manager Phone: [phone]

## 2023-07-06 NOTE — Patient Outreach (Signed)
  Care Coordination   Follow Up Visit Note   07/06/2023 Late entry for 07/05/23 Name: Daniel Kline MRN: [id number] DOB: 1946-02-06  Daniel Kline is a 77 y.o. year old male who sees [name], MD for primary care. I spoke with  [name] by phone today.  What matters to the patients health and wellness today?  Patient states he is having issues with his right leg again. He report having redness and itching.  He reports seeing his primary care provider on 06/17/23 and being prescribed an antibiotic and cream for his leg.   Patient denies any swelling in his leg. He states the itching is a little better.  Patient denies any noticeable heart failure symptoms.        Goals Addressed             This Visit's Progress    Management of health conditions       Interventions Today    Flowsheet Row Most Recent Value  Chronic Disease   Chronic disease during today's visit Congestive Heart Failure (CHF), Other  [right leg redness/ swelling]  General Interventions   General Interventions Discussed/Reviewed General Interventions Reviewed, Doctor Visits  [evaluation of current treatment plan for mentioned health for HF/ right leg redness/ swelling and patients adherence to plan as established by provider.  Assessed for HF / right leg symptoms.]  Doctor Visits Discussed/Reviewed Doctor Visits Reviewed  [name] upcoming provider visits. Advised to keep follow up visits with providers as recommended.]  Education Interventions   Education Provided Provided Education  Provided Verbal Education On When to see the doctor  [Advised to monitor right leg symptoms and report increased / non healing symptoms to provider.]  Nutrition Interventions   Nutrition Discussed/Reviewed Decreasing salt, Nutrition Reviewed  Pharmacy Interventions   Pharmacy Dicussed/Reviewed Pharmacy Topics Reviewed  [Reviewed medications.  Patient advised to take antibiotic until completed. Confirmed patient  applying prescribed creams to right leg. Assessed for ongoing leg itching and/ or swelling.]              SDOH assessments and interventions completed:  No     Care Coordination Interventions:  Yes, provided   Follow up plan: Follow up call scheduled for 07/25/23    Encounter Outcome:  Patient Visit Completed   [name] RN,BSN,CCM [facility]  [facility], [facility] coordinator / Case Manager Phone: [phone]

## 2023-07-12 DIAGNOSIS — N2889 Other specified disorders of kidney and ureter: Secondary | ICD-10-CM | POA: Diagnosis not present

## 2023-07-12 DIAGNOSIS — N184 Chronic kidney disease, stage 4 (severe): Secondary | ICD-10-CM | POA: Diagnosis not present

## 2023-07-12 DIAGNOSIS — R809 Proteinuria, unspecified: Secondary | ICD-10-CM | POA: Diagnosis not present

## 2023-07-12 DIAGNOSIS — E785 Hyperlipidemia, unspecified: Secondary | ICD-10-CM | POA: Diagnosis not present

## 2023-07-12 DIAGNOSIS — I129 Hypertensive chronic kidney disease with stage 1 through stage 4 chronic kidney disease, or unspecified chronic kidney disease: Secondary | ICD-10-CM | POA: Diagnosis not present

## 2023-07-12 DIAGNOSIS — N2581 Secondary hyperparathyroidism of renal origin: Secondary | ICD-10-CM | POA: Diagnosis not present

## 2023-07-12 DIAGNOSIS — E1122 Type 2 diabetes mellitus with diabetic chronic kidney disease: Secondary | ICD-10-CM | POA: Diagnosis not present

## 2023-07-12 DIAGNOSIS — D631 Anemia in chronic kidney disease: Secondary | ICD-10-CM | POA: Diagnosis not present

## 2023-07-18 ENCOUNTER — Other Ambulatory Visit: Payer: Self-pay | Admitting: Internal Medicine

## 2023-07-18 NOTE — Telephone Encounter (Signed)
Name of Medication: Hydrocodone Name of [facility]acy: [facility] Last Fill or Written Date and Quantity: 06-20-23 #60 Last Office Visit and Type: 06-17-23 Next Office Visit and Type: 10-31-22 Last Controlled Substance Agreement Date: 01-27-23 Last UDS: 01-27-23

## 2023-07-25 ENCOUNTER — Ambulatory Visit: Payer: Self-pay

## 2023-07-25 NOTE — Patient Instructions (Signed)
Visit Information  Thank you for taking time to visit with me today. Please don't hesitate to contact me if I can be of assistance to you.   Following are the goals we discussed today:   Goals Addressed             This Visit's Progress    Management of health conditions       Interventions Today    Flowsheet Row Most Recent Value  Chronic Disease   Chronic disease during today's visit Congestive Heart Failure (CHF), Chronic Kidney Disease/End Stage Renal Disease (ESRD), Other  [right leg swelling/ redness]  General Interventions   General Interventions Discussed/Reviewed General Interventions Reviewed, Doctor Visits  [evaluation of current treatment plan for HF/ CKD/ right leg redness/ swelling.  Assessed for HF symptoms and/ or ongoing right leg symptoms.]  Doctor Visits Discussed/Reviewed Doctor Visits Reviewed  [name] upcoming provider visits. Advised to keep follow up visit with providers as recommended.]  Education Interventions   Education Provided Provided Education  [reviewed heart failure symptoms.Discussed importance of staying hydrated due to recent diarrhea/ nausea/ vomiting.  Advised to notify provider for ongoing diarrhea/ Nausea/ vomiting symptoms.]  Provided Verbal Education On Other  [Advised to start monitoring BP and recording as requested by nephrologist.]  Nutrition Interventions   Nutrition Discussed/Reviewed Nutrition Reviewed, Decreasing salt  [Advised to drink broth, diet ginger ale, and increase water intake.  Advised to consider BRAT diet and/ or chicken noodle soup if feeling up to eating.]  Pharmacy Interventions   Pharmacy Dicussed/Reviewed Pharmacy Topics Reviewed  [medication review completed with wife.]              Our next appointment is by telephone on 08/19/23 at 2 pm  Please call the care guide team at [phone] if you need to cancel or reschedule your appointment.   If you are experiencing a Mental Health or Behavioral Health  Crisis or need someone to talk to, please call the Suicide and Crisis Lifeline: 988 call 1-800-273-TALK (toll free, 24 hour hotline)  Patient verbalizes understanding of instructions and care plan provided today and agrees to view in MyChart. Active MyChart status and patient understanding of how to access instructions and care plan via MyChart confirmed with patient.     [name] RN,BSN,CCM [place]  [facility], [facility] coordinator / Case Manager Phone: [phone]

## 2023-07-25 NOTE — Patient Outreach (Signed)
  Care Coordination   Follow Up Visit Note   07/25/2023 Name: Daniel Kline MRN: [id number] DOB: [birth year]  Daniel Kline is a 77 y.o. year old male who sees Daniel Schwalbe, MD for primary care. I spoke with  Daniel Kline by phone today.  What matters to the patients health and wellness today?  Patient reports having a sore throat and 3 days of diarrhea, nausea and vomiting. He states his wife purchase kaopectate that he took on yesterday.  He reports feeling a little better. Denies any further nausea/ vomiting/ diarrhea.  He states he still feels weak and doesn't have an appetite. Wife states patient missed the last 3 days of his medications due to being sick. Patient reports feeling a little better and up to taking his medications. Patient denies heart failure symptoms. Denies having any further swelling or redness in right leg.  He states he continues to use prescribed cream to right leg and has determined that leg has healed well. Patient states he has not started checking his BP as requested by his nephrologist since he has been sick.     Goals Addressed             This Visit's Progress    Management of health conditions       Interventions Today    Flowsheet Row Most Recent Value  Chronic Disease   Chronic disease during today's visit Congestive Heart Failure (CHF), Chronic Kidney Disease/End Stage Renal Disease (ESRD), Other  [right leg swelling/ redness]  General Interventions   General Interventions Discussed/Reviewed General Interventions Reviewed, Doctor Visits  [evaluation of current treatment plan for HF/ CKD/ right leg redness/ swelling.  Assessed for HF symptoms and/ or ongoing right leg symptoms.]  Doctor Visits Discussed/Reviewed Doctor Visits Reviewed  Daniel Kline upcoming provider visits. Advised to keep follow up visit with providers as recommended.]  Education Interventions   Education Provided Provided Education  [reviewed heart failure symptoms.Discussed  importance of staying hydrated due to recent diarrhea/ nausea/ vomiting.  Advised to notify provider for ongoing diarrhea/ Nausea/ vomiting symptoms.]  Provided Verbal Education On Other  [Advised to start monitoring BP and recording as requested by nephrologist.]  Nutrition Interventions   Nutrition Discussed/Reviewed Nutrition Reviewed, Decreasing salt  [Advised to drink broth, diet ginger ale, and increase water intake.  Advised to consider BRAT diet and/ or chicken noodle soup if feeling up to eating.]  Pharmacy Interventions   Pharmacy Dicussed/Reviewed Pharmacy Topics Reviewed  [medication review completed with wife.]              SDOH assessments and interventions completed:  No     Care Coordination Interventions:  Yes, provided   Follow up plan: Follow up call scheduled for 08/19/23    Encounter Outcome:  Patient Visit Completed   [name] RN,BSN,CCM [facility]  [facility], [facility] coordinator / Case Manager Phone: [phone]

## 2023-07-28 ENCOUNTER — Inpatient Hospital Stay
Admission: EM | Admit: 2023-07-28 | Discharge: 2023-07-31 | DRG: 683 | Disposition: A | Discharge status: Home Health | Payer: Medicare Other | Attending: Internal Medicine | Admitting: Internal Medicine

## 2023-07-28 ENCOUNTER — Other Ambulatory Visit: Payer: Self-pay

## 2023-07-28 ENCOUNTER — Encounter: Payer: Self-pay | Admitting: Emergency Medicine

## 2023-07-28 ENCOUNTER — Emergency Department: Payer: Medicare Other

## 2023-07-28 DIAGNOSIS — Z96651 Presence of right artificial knee joint: Secondary | ICD-10-CM | POA: Diagnosis present

## 2023-07-28 DIAGNOSIS — Z8614 Personal history of Methicillin resistant Staphylococcus aureus infection: Secondary | ICD-10-CM | POA: Diagnosis not present

## 2023-07-28 DIAGNOSIS — I129 Hypertensive chronic kidney disease with stage 1 through stage 4 chronic kidney disease, or unspecified chronic kidney disease: Secondary | ICD-10-CM | POA: Diagnosis not present

## 2023-07-28 DIAGNOSIS — F419 Anxiety disorder, unspecified: Secondary | ICD-10-CM | POA: Diagnosis present

## 2023-07-28 DIAGNOSIS — G8929 Other chronic pain: Secondary | ICD-10-CM | POA: Diagnosis present

## 2023-07-28 DIAGNOSIS — Z79899 Other long term (current) drug therapy: Secondary | ICD-10-CM

## 2023-07-28 DIAGNOSIS — Z8679 Personal history of other diseases of the circulatory system: Secondary | ICD-10-CM

## 2023-07-28 DIAGNOSIS — R197 Diarrhea, unspecified: Secondary | ICD-10-CM | POA: Diagnosis present

## 2023-07-28 DIAGNOSIS — Z9841 Cataract extraction status, right eye: Secondary | ICD-10-CM

## 2023-07-28 DIAGNOSIS — D649 Anemia, unspecified: Secondary | ICD-10-CM | POA: Diagnosis present

## 2023-07-28 DIAGNOSIS — E876 Hypokalemia: Secondary | ICD-10-CM | POA: Diagnosis not present

## 2023-07-28 DIAGNOSIS — A09 Infectious gastroenteritis and colitis, unspecified: Secondary | ICD-10-CM | POA: Diagnosis not present

## 2023-07-28 DIAGNOSIS — Z6836 Body mass index (BMI) 36.0-36.9, adult: Secondary | ICD-10-CM

## 2023-07-28 DIAGNOSIS — I491 Atrial premature depolarization: Secondary | ICD-10-CM

## 2023-07-28 DIAGNOSIS — E1122 Type 2 diabetes mellitus with diabetic chronic kidney disease: Secondary | ICD-10-CM | POA: Diagnosis not present

## 2023-07-28 DIAGNOSIS — N189 Chronic kidney disease, unspecified: Secondary | ICD-10-CM | POA: Diagnosis present

## 2023-07-28 DIAGNOSIS — Z1152 Encounter for screening for COVID-19: Secondary | ICD-10-CM | POA: Diagnosis not present

## 2023-07-28 DIAGNOSIS — Z961 Presence of intraocular lens: Secondary | ICD-10-CM | POA: Diagnosis not present

## 2023-07-28 DIAGNOSIS — E785 Hyperlipidemia, unspecified: Secondary | ICD-10-CM | POA: Diagnosis present

## 2023-07-28 DIAGNOSIS — Z96643 Presence of artificial hip joint, bilateral: Secondary | ICD-10-CM | POA: Diagnosis present

## 2023-07-28 DIAGNOSIS — D75839 Thrombocytosis, unspecified: Secondary | ICD-10-CM | POA: Diagnosis present

## 2023-07-28 DIAGNOSIS — Z87891 Personal history of nicotine dependence: Secondary | ICD-10-CM

## 2023-07-28 DIAGNOSIS — I10 Essential (primary) hypertension: Secondary | ICD-10-CM | POA: Diagnosis present

## 2023-07-28 DIAGNOSIS — G4733 Obstructive sleep apnea (adult) (pediatric): Secondary | ICD-10-CM | POA: Diagnosis present

## 2023-07-28 DIAGNOSIS — Z885 Allergy status to narcotic agent status: Secondary | ICD-10-CM

## 2023-07-28 DIAGNOSIS — E669 Obesity, unspecified: Secondary | ICD-10-CM | POA: Diagnosis present

## 2023-07-28 DIAGNOSIS — Z85828 Personal history of other malignant neoplasm of skin: Secondary | ICD-10-CM | POA: Diagnosis not present

## 2023-07-28 DIAGNOSIS — I480 Paroxysmal atrial fibrillation: Secondary | ICD-10-CM | POA: Diagnosis not present

## 2023-07-28 DIAGNOSIS — D631 Anemia in chronic kidney disease: Secondary | ICD-10-CM | POA: Diagnosis present

## 2023-07-28 DIAGNOSIS — R001 Bradycardia, unspecified: Secondary | ICD-10-CM | POA: Diagnosis present

## 2023-07-28 DIAGNOSIS — K429 Umbilical hernia without obstruction or gangrene: Secondary | ICD-10-CM | POA: Diagnosis not present

## 2023-07-28 DIAGNOSIS — I130 Hypertensive heart and chronic kidney disease with heart failure and stage 1 through stage 4 chronic kidney disease, or unspecified chronic kidney disease: Secondary | ICD-10-CM | POA: Diagnosis not present

## 2023-07-28 DIAGNOSIS — E1165 Type 2 diabetes mellitus with hyperglycemia: Secondary | ICD-10-CM | POA: Diagnosis not present

## 2023-07-28 DIAGNOSIS — Z8612 Personal history of poliomyelitis: Secondary | ICD-10-CM

## 2023-07-28 DIAGNOSIS — E1169 Type 2 diabetes mellitus with other specified complication: Secondary | ICD-10-CM

## 2023-07-28 DIAGNOSIS — E878 Other disorders of electrolyte and fluid balance, not elsewhere classified: Secondary | ICD-10-CM | POA: Diagnosis present

## 2023-07-28 DIAGNOSIS — N1832 Chronic kidney disease, stage 3b: Secondary | ICD-10-CM | POA: Diagnosis present

## 2023-07-28 DIAGNOSIS — Z7985 Long-term (current) use of injectable non-insulin antidiabetic drugs: Secondary | ICD-10-CM

## 2023-07-28 DIAGNOSIS — N179 Acute kidney failure, unspecified: Principal | ICD-10-CM | POA: Diagnosis present

## 2023-07-28 DIAGNOSIS — Z91030 Bee allergy status: Secondary | ICD-10-CM

## 2023-07-28 DIAGNOSIS — R5383 Other fatigue: Secondary | ICD-10-CM | POA: Diagnosis present

## 2023-07-28 DIAGNOSIS — K8020 Calculus of gallbladder without cholecystitis without obstruction: Secondary | ICD-10-CM | POA: Diagnosis present

## 2023-07-28 DIAGNOSIS — I5032 Chronic diastolic (congestive) heart failure: Secondary | ICD-10-CM | POA: Diagnosis not present

## 2023-07-28 DIAGNOSIS — Z888 Allergy status to other drugs, medicaments and biological substances status: Secondary | ICD-10-CM

## 2023-07-28 DIAGNOSIS — Z8572 Personal history of non-Hodgkin lymphomas: Secondary | ICD-10-CM

## 2023-07-28 DIAGNOSIS — K5730 Diverticulosis of large intestine without perforation or abscess without bleeding: Secondary | ICD-10-CM | POA: Diagnosis not present

## 2023-07-28 DIAGNOSIS — N200 Calculus of kidney: Secondary | ICD-10-CM | POA: Diagnosis not present

## 2023-07-28 DIAGNOSIS — D638 Anemia in other chronic diseases classified elsewhere: Secondary | ICD-10-CM | POA: Diagnosis present

## 2023-07-28 LAB — URINALYSIS, ROUTINE W REFLEX MICROSCOPIC
Bacteria, UA: NONE SEEN
Bilirubin Urine: NEGATIVE
Glucose, UA: 50 mg/dL — AB
Hgb urine dipstick: NEGATIVE
Ketones, ur: NEGATIVE mg/dL
Leukocytes,Ua: NEGATIVE
Nitrite: NEGATIVE
Protein, ur: 100 mg/dL — AB
Specific Gravity, Urine: 1.017 (ref 1.005–1.030)
pH: 5 (ref 5.0–8.0)

## 2023-07-28 LAB — COMPREHENSIVE METABOLIC PANEL WITH GFR
ALT: 28 U/L (ref 0–44)
AST: 16 U/L (ref 15–41)
Albumin: 3.2 g/dL — ABNORMAL LOW (ref 3.5–5.0)
Alkaline Phosphatase: 58 U/L (ref 38–126)
Anion gap: 9 (ref 5–15)
BUN: 66 mg/dL — ABNORMAL HIGH (ref 8–23)
CO2: 21 mmol/L — ABNORMAL LOW (ref 22–32)
Calcium: 8.7 mg/dL — ABNORMAL LOW (ref 8.9–10.3)
Chloride: 104 mmol/L (ref 98–111)
Creatinine, Ser: 3.41 mg/dL — ABNORMAL HIGH (ref 0.61–1.24)
GFR, Estimated: 18 mL/min — ABNORMAL LOW
Glucose, Bld: 162 mg/dL — ABNORMAL HIGH (ref 70–99)
Potassium: 3.4 mmol/L — ABNORMAL LOW (ref 3.5–5.1)
Sodium: 134 mmol/L — ABNORMAL LOW (ref 135–145)
Total Bilirubin: 0.4 mg/dL
Total Protein: 6.2 g/dL — ABNORMAL LOW (ref 6.5–8.1)

## 2023-07-28 LAB — CBC
HCT: 36.6 % — ABNORMAL LOW (ref 39.0–52.0)
Hemoglobin: 12.3 g/dL — ABNORMAL LOW (ref 13.0–17.0)
MCH: 30.4 pg (ref 26.0–34.0)
MCHC: 33.6 g/dL (ref 30.0–36.0)
MCV: 90.6 fL (ref 80.0–100.0)
Platelets: 437 10*3/uL — ABNORMAL HIGH (ref 150–400)
RBC: 4.04 MIL/uL — ABNORMAL LOW (ref 4.22–5.81)
RDW: 14.5 % (ref 11.5–15.5)
WBC: 5 10*3/uL (ref 4.0–10.5)
nRBC: 0 % (ref 0.0–0.2)

## 2023-07-28 LAB — RESP PANEL BY RT-PCR (RSV, FLU A&B, COVID)  RVPGX2
Influenza A by PCR: NEGATIVE
Influenza B by PCR: NEGATIVE
Resp Syncytial Virus by PCR: NEGATIVE
SARS Coronavirus 2 by RT PCR: NEGATIVE

## 2023-07-28 LAB — LIPASE, BLOOD: Lipase: 81 U/L — ABNORMAL HIGH (ref 11–51)

## 2023-07-28 MED ORDER — SODIUM CHLORIDE 0.9 % IV BOLUS
1000.0000 mL | Freq: Once | INTRAVENOUS | Status: AC
  Administered 2023-07-28: 1000 mL via INTRAVENOUS

## 2023-07-28 NOTE — H&P (Addendum)
History and Physical    Patient: Daniel Kline XBM:[id number] DOB: 09-Jun-1946 DOA: 07/28/2023 DOS: the patient was seen and examined on 07/29/2023 PCP: [name], MD  Patient coming from: Home  Chief Complaint:  Chief Complaint  Patient presents with   Diarrhea    HPI: Daniel Kline is a 77 y.o. male with medical history significant for atrial flutter status post catheter ablation in September 2018, chronic HFpEF, hypertension, hyperlipidemia, diabetes, stage III chronic kidney disease, cutaneous T-cell lymphoma, chronic low back pain, and sleep apnea on CPAP, complaints of diarrhea since Friday and is not able to keep food down.  Has been using over-the-counter antidiarrheal agents which have helped somewhat but not resolved.  Reports of abdominal pain with this diarrhea.  No reports of travel sick contact cough shortness of breath difficulty speaking gait issues or falls or incontinence.  In emergency room vitals trend shows: Alert awake oriented afebrile O2 sats of 94% on room air. Vitals:   07/28/23 1330 07/28/23 1702 07/28/23 1818 07/28/23 2239  BP: (!) 141/98 99/73  (!) 157/85  Pulse: 79 (!) 52  (!) 57  Temp: 97.8 F (36.6 C)  98.3 F (36.8 C) 97.8 F (36.6 C)  Resp: 18 18  17   Height: 5\' 8"  (1.727 m)     Weight: 108.9 kg     SpO2: 94% 97%  100%  TempSrc: Oral  Oral Oral  BMI (Calculated): 36.5     EKG : irregular ? Type ii 2nd degree a/v block  Labs are notable for : Urinalysis shows clear urine with 0-5 WBCs and RBCs nitrite negative. Lipase of 81. CMP showing hypokalemia of 3.4 sodium 134 bicarb 21 AKI on CKD with a creatinine of 3.41 EGFR of 18 AST ALT within normal limits calcium 8.7 magnesium ordered and pending. CBC shows anemia with a hemoglobin of 12.3 normal white count and platelets of 437. Viral panel negative for COVID and flu and RSV.   In the ED pt received: Medications  pantoprazole (PROTONIX) injection 40 mg (40 mg Intravenous Given  07/29/23 0145)  thiamine (VITAMIN B1) injection 100 mg (100 mg Intravenous Given 07/29/23 0145)  sodium chloride flush (NS) 0.9 % injection 3 mL (3 mLs Intravenous Given 07/29/23 0207)  acetaminophen (TYLENOL) tablet 650 mg (has no administration in time range)    Or  acetaminophen (TYLENOL) suppository 650 mg (has no administration in time range)  heparin injection 5,000 Units (5,000 Units Subcutaneous Given 07/29/23 0232)  insulin aspart (novoLOG) injection 0-15 Units (has no administration in time range)  lactated ringers infusion (has no administration in time range)  sodium chloride 0.9 % bolus 1,000 mL (0 mLs Intravenous Stopped 07/28/23 2140)  sodium chloride 0.9 % bolus 1,000 mL (0 mLs Intravenous Stopped 07/29/23 0114)  potassium chloride 10 mEq in 100 mL IVPB (0 mEq Intravenous Stopped 07/29/23 0323)   Review of Systems  Unable to perform ROS: Acuity of condition  Gastrointestinal:  Positive for diarrhea.   Past Medical History:  Diagnosis Date   Anemia    H/O   Anxiety    Arthritis    Atrial flutter (HCC)    a. s/p post ablation in 04/2017   Chronic kidney disease    Complication of anesthesia    CTCL (cutaneous T-cell lymphoma) (HCC)    Diabetes mellitus without complication (HCC)    Diastolic dysfunction    a. 03/2022 Echo: EF 60-65%, no rwma, GrI DD, nl RV fxn.   Dysplastic nevus 12/19/2017  Right distal lat. forearm near wrist. Severe atypia, close to peripheral margin.   Dysplastic nevus 06/21/2018   Upper back right paraspinal. Severe atypia, peripheral margin involved. Excised 07/11/2018, margins free.   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 forearm. SCCis, hypertrophic.   Past Surgical History:  Procedure Laterality Date   BACK SURGERY     LUMBAR   CARDIAC ELECTROPHYSIOLOGY STUDY AND ABLATION  2019   CATARACT EXTRACTION W/PHACO Right 05/05/2022   Procedure: CATARACT EXTRACTION PHACO AND INTRAOCULAR LENS PLACEMENT (IOC) RIGHT;  Surgeon: Lockie Mola, MD;  Location: [facility];  Service: Ophthalmology;  Laterality: Right;  Diabetic 10.42 01:13.4   COLONOSCOPY WITH PROPOFOL N/A 04/04/2019   Procedure: COLONOSCOPY WITH PROPOFOL;  Surgeon: Toledo, Boykin Nearing, MD;  Location: [facility];  Service: Gastroenterology;  Laterality: N/A;   I & D EXTREMITY Right 02/01/2020   Procedure: IRRIGATION AND DEBRIDEMENT EXTREMITY with poly exchange;  Surgeon: Donato Heinz, MD;  Location: [facility];  Service: Orthopedics;  Laterality: Right;   INCISION AND DRAINAGE     BACK-MRSA INFECTION AFTER BACK SURGERY   KNEE ARTHROPLASTY Right 01/28/2020   Procedure: COMPUTER ASSISTED TOTAL KNEE ARTHROPLASTY;  Surgeon: Donato Heinz, MD;  Location: [facility];  Service: Orthopedics;  Laterality: Right;   MOUTH SURGERY     right elbow surgery     right knee surgery     right shoulder surgery     from polio damage   TONSILLECTOMY     TOTAL HIP ARTHROPLASTY Bilateral 04/2016    reports that he quit smoking about 32 years ago. His smoking use included cigars. He has been exposed to tobacco smoke. He quit smokeless tobacco use about 17 years ago. He reports that he does not drink alcohol and does not use drugs.  Allergies  Allergen Reactions   Bee Venom Anaphylaxis   Oxycodone Other (See Comments)    Delusions   Hydromorphone Other (See Comments)    hallucinating   Hydroxychloroquine     Other reaction(s): Other (See Comments) He broke out really badly.   Zolpidem Other (See  Comments)    Family History  Adopted: Yes    Prior to Admission medications   Medication Sig Start Date End Date Taking? Authorizing Provider  amLODipine (NORVASC) 5 MG tablet TAKE 1 TABLET BY MOUTH DAILY 06/16/22   [name], MD  amoxicillin-clavulanate (AUGMENTIN) 875-125 MG tablet Take 1 tablet by mouth 2 (two) times daily. Patient not taking: Reported on 07/25/2023 06/17/23   [name], MD  bismuth subsalicylate (PEPTO BISMOL) 262 MG/15ML suspension Take 30 mLs by mouth every 6 (six) hours as needed.    [provider]  calcium carbonate (OSCAL) 1500 (600 Ca) MG TABS tablet Take 600 mg of elemental calcium by mouth daily with breakfast. Patient not taking: Reported on 07/25/2023    [provider]  carvedilol (COREG) 3.125 MG tablet TAKE 1  TABLET BY MOUTH 2 TIMES DAILY WITH A MEAL. 04/21/23   [name], MD  Cholecalciferol (VITAMIN D-3) 25 MCG (1000 UT) CAPS Take 1,000 Units by mouth daily.    [provider]  clorazepate (TRANXENE) 7.5 MG tablet TAKE 1 TABLET BY MOUTH TWICE A DAY AS NEEDED FOR ANXIETY 06/15/23   [name], MD  cyanocobalamin (VITAMIN B12) 500 MCG tablet Take 1,000 mcg by mouth daily.    [provider]  EPINEPHrine 0.3 mg/0.3 mL IJ SOAJ injection Inject 0.3 mg into the muscle as needed for anaphylaxis. 01/27/23   [name], MD  Ferrous Sulfate (IRON PO) Take 1 tablet by mouth at bedtime.    [provider]  folic acid (FOLVITE) 1 MG tablet Take 1 mg by mouth daily. Every day except day that he takes methotrexate 06/11/20   [provider]  furosemide (LASIX) 40 MG tablet Take 1 tablet (40 mg total) by mouth daily. 11/25/22   Creig Hines, NP  gabapentin (NEURONTIN) 300 MG capsule TAKE 1 CAPSULE BY MOUTH 3 TIMES  DAILY 01/31/23   Tillman Abide I, MD  glucose blood (ONETOUCH ULTRA) test strip 1 each by Other route daily. Use to check blood sugar once daily 10/19/22   Tillman Abide I, MD  HYDROcodone-acetaminophen (NORCO/VICODIN) 5-325 MG tablet TAKE 1 TABLET BY MOUTH EVERY 6 HOURS AS NEEDED FOR PAIN 07/18/23   Tillman Abide I, MD  hydrocortisone 2.5 % cream Apply topically 3 (three) times daily as needed. 06/17/23   [name], MD  hydrOXYzine (ATARAX) 25 MG tablet TAKE ONE TABLET EVERY EIGHT HOURS AS NEEDED 03/16/23   [name], MD  isosorbide mononitrate (IMDUR) 60 MG 24 hr tablet Take 1 tablet (60 mg total) by mouth daily before lunch. 05/11/21   Antonieta Iba, MD  ketoconazole (NIZORAL) 2 % cream Apply 1 Application topically at bedtime. 06/30/23 07/30/23  Deirdre Evener, MD  losartan (COZAAR) 100 MG tablet TAKE 1 TABLET BY MOUTH DAILY 04/21/23   Antonieta Iba, MD  MAGNESIUM PO Take 1 tablet by mouth daily.    [provider]  methotrexate (RHEUMATREX) 2.5 MG tablet Take 2.5 mg by mouth once a week. 02/15/23   [provider]  Multiple Vitamins-Minerals (MULTIVITAL) tablet Take 1 tablet by mouth daily.    [provider]  rosuvastatin (CRESTOR) 10 MG tablet Take 1 tablet (10 mg total) by mouth daily. 04/07/23   [name], MD  Semaglutide,0.25 or 0.5MG /DOS, (OZEMPIC, 0.25 OR 0.5 MG/DOSE,) 2 MG/3ML SOPN Inject 0.25 mg into the skin once a week. 07/01/23   [name], MD  spironolactone (ALDACTONE) 25 MG tablet Take 25 mg by mouth daily.    [provider]  timolol (TIMOPTIC) 0.5 % ophthalmic solution Place 1 drop into both eyes 2 (two) times daily. 01/11/20   [provider]     Vitals:   07/28/23 1330 07/28/23 1702 07/28/23 1818 07/28/23 2239  BP: (!) 141/98 99/73  (!) 157/85  Pulse: 79 (!) 52  (!) 57  Resp: 18 18  17   Temp: 97.8 F (36.6 C)  98.3 F (36.8 C) 97.8 F (36.6 C)  TempSrc: Oral  Oral Oral  SpO2: 94% 97%  100%  Weight: 108.9 kg     Height: 5\' 8"  (1.727 m)      Physical Exam Vitals and nursing note reviewed.  Constitutional:      General: He is not in acute  distress. HENT:     Head: Normocephalic and atraumatic.     Right Ear: Hearing normal.     Left Ear: Hearing normal.     Nose: Nose normal. No nasal deformity.     Mouth/Throat:     Lips: Pink.     Tongue: No lesions.     Pharynx: Oropharynx is clear.  Eyes:     General: Lids are normal.     Extraocular Movements: Extraocular movements intact.  Cardiovascular:     Rate and Rhythm: Normal rate. Rhythm irregular.     Heart sounds: Normal heart sounds.  Pulmonary:     Effort: Pulmonary effort is normal.     Breath sounds: Normal breath sounds.  Abdominal:     General: Abdomen is protuberant. Bowel sounds are normal. There is no distension.     Palpations: Abdomen is soft. There is no mass.     Tenderness: There is no abdominal tenderness.     Comments: Exam WNL / NT/NBS.   Musculoskeletal:     Right lower leg: No edema.     Left lower leg: No edema.  Skin:    General: Skin is warm.     Comments: Skin cancer on left forearm pt advised to see dermatology.   Neurological:     General: No focal deficit present.     Mental Status: He is alert and oriented to person, place, and time.     Cranial Nerves: Cranial nerves 2-12 are intact.  Psychiatric:        Attention and Perception: Attention normal.        Mood and Affect: Mood normal.        Speech: Speech normal.        Behavior: Behavior normal. Behavior is cooperative.     Labs on Admission: I have personally reviewed following labs and imaging studies Results for orders placed or performed during the hospital encounter of 07/28/23 (from the past 24 hours)  Lipase, blood     Status: Abnormal   Collection Time: 07/28/23  1:33 PM  Result Value Ref Range   Lipase 81 (H) 11 - 51 U/L  Comprehensive metabolic panel     Status: Abnormal   Collection Time: 07/28/23  1:33 PM  Result Value Ref Range   Sodium 134 (L) 135 - 145 mmol/L   Potassium 3.4 (L) 3.5 - 5.1 mmol/L   Chloride 104 98 - 111 mmol/L   CO2 21 (L) 22 - 32 mmol/L    Glucose, Bld 162 (H) 70 - 99 mg/dL   BUN 66 (H) 8 - 23 mg/dL   Creatinine, Ser 1.61 (H) 0.61 - 1.24 mg/dL   Calcium 8.7 (L) 8.9 - 10.3 mg/dL   Total Protein 6.2 (L) 6.5 - 8.1 g/dL   Albumin 3.2 (L) 3.5 - 5.0 g/dL   AST 16 15 - 41 U/L   ALT 28 0 - 44 U/L   Alkaline Phosphatase 58 38 - 126 U/L   Total Bilirubin 0.4 <1.2 mg/dL   GFR, Estimated 18 (L) >60 mL/min   Anion gap 9 5 - 15  CBC     Status: Abnormal   Collection Time: 07/28/23  1:33 PM  Result Value Ref Range   WBC 5.0 4.0 - 10.5 K/uL   RBC 4.04 (L) 4.22 - 5.81 MIL/uL   Hemoglobin 12.3 (L) 13.0 - 17.0 g/dL   HCT 09.6 (L) 04.5 - 40.9 %   MCV 90.6 80.0 - 100.0 fL   MCH  30.4 26.0 - 34.0 pg   MCHC 33.6 30.0 - 36.0 g/dL   RDW 78.2 95.6 - 21.3 %   Platelets 437 (H) 150 - 400 K/uL   nRBC 0.0 0.0 - 0.2 %  Magnesium     Status: Abnormal   Collection Time: 07/28/23  1:33 PM  Result Value Ref Range   Magnesium 2.7 (H) 1.7 - 2.4 mg/dL  TSH     Status: None   Collection Time: 07/28/23  1:33 PM  Result Value Ref Range   TSH 0.878 0.350 - 4.500 uIU/mL  T4, free     Status: None   Collection Time: 07/28/23  1:33 PM  Result Value Ref Range   Free T4 1.08 0.61 - 1.12 ng/dL  Resp panel by RT-PCR (RSV, Flu A&B, Covid) Anterior Nasal Swab     Status: None   Collection Time: 07/28/23  7:04 PM   Specimen: Anterior Nasal Swab  Result Value Ref Range   SARS Coronavirus 2 by RT PCR NEGATIVE NEGATIVE   Influenza A by PCR NEGATIVE NEGATIVE   Influenza B by PCR NEGATIVE NEGATIVE   Resp Syncytial Virus by PCR NEGATIVE NEGATIVE  Urinalysis, Routine w reflex microscopic -Urine, Clean Catch     Status: Abnormal   Collection Time: 07/28/23  9:39 PM  Result Value Ref Range   Color, Urine YELLOW (A) YELLOW   APPearance CLEAR (A) CLEAR   Specific Gravity, Urine 1.017 1.005 - 1.030   pH 5.0 5.0 - 8.0   Glucose, UA 50 (A) NEGATIVE mg/dL   Hgb urine dipstick NEGATIVE NEGATIVE   Bilirubin Urine NEGATIVE NEGATIVE   Ketones, ur NEGATIVE NEGATIVE  mg/dL   Protein, ur 086 (A) NEGATIVE mg/dL   Nitrite NEGATIVE NEGATIVE   Leukocytes,Ua NEGATIVE NEGATIVE   RBC / HPF 0-5 0 - 5 RBC/hpf   WBC, UA 0-5 0 - 5 WBC/hpf   Bacteria, UA NONE SEEN NONE SEEN   Squamous Epithelial / HPF 0-5 0 - 5 /HPF   Mucus PRESENT    Hyaline Casts, UA PRESENT   Type and screen     Status: None   Collection Time: 07/29/23  1:33 AM  Result Value Ref Range   ABO/RH(D) O POS    Antibody Screen NEG    Sample Expiration      08/01/2023,2359 Performed at [facility], [address] [place], [place] [address]     CBC: Recent Labs  Lab 07/28/23 1333  WBC 5.0  HGB 12.3*  HCT 36.6*  MCV 90.6  PLT 437*   Basic Metabolic Panel: Recent Labs  Lab 07/28/23 1333  NA 134*  K 3.4*  CL 104  CO2 21*  GLUCOSE 162*  BUN 66*  CREATININE 3.41*  CALCIUM 8.7*  MG 2.7*   GFR: Estimated Creatinine Clearance: 21.4 mL/min (A) (by C-G formula based on SCr of 3.41 mg/dL (H)). Liver Function Tests: Recent Labs  Lab 07/28/23 1333  AST 16  ALT 28  ALKPHOS 58  BILITOT 0.4  PROT 6.2*  ALBUMIN 3.2*   Recent Labs  Lab 07/28/23 1333  LIPASE 81*   No results for input(s): "AMMONIA" in the last 168 hours. Coagulation Profile: No results for input(s): "INR", "PROTIME" in the last 168 hours. Cardiac Enzymes: No results for input(s): "CKTOTAL", "CKMB", "CKMBINDEX", "TROPONINI" in the last 168 hours. BNP (last 3 results) No results for input(s): "PROBNP" in the last 8760 hours. HbA1C: No results for input(s): "HGBA1C" in the last 72 hours. CBG: No results  for input(s): "GLUCAP" in the last 168 hours. Lipid Profile: No results for input(s): "CHOL", "HDL", "LDLCALC", "TRIG", "CHOLHDL", "LDLDIRECT" in the last 72 hours. Thyroid Function Tests: Recent Labs    07/28/23 1333  TSH 0.878  FREET4 1.08   Anemia Panel: No results for input(s): "VITAMINB12", "FOLATE", "FERRITIN", "TIBC", "IRON", "RETICCTPCT" in the last 72 hours. Urinalysis     Component Value Date/Time   COLORURINE YELLOW (A) 07/28/2023 2139   APPEARANCEUR CLEAR (A) 07/28/2023 2139   APPEARANCEUR Clear 10/18/2011 0958   LABSPEC 1.017 07/28/2023 2139   LABSPEC 1.021 10/18/2011 0958   PHURINE 5.0 07/28/2023 2139   GLUCOSEU 50 (A) 07/28/2023 2139   GLUCOSEU Negative 10/18/2011 0958   HGBUR NEGATIVE 07/28/2023 2139   BILIRUBINUR NEGATIVE 07/28/2023 2139   BILIRUBINUR Negative 10/18/2011 0958   KETONESUR NEGATIVE 07/28/2023 2139   PROTEINUR 100 (A) 07/28/2023 2139   NITRITE NEGATIVE 07/28/2023 2139   LEUKOCYTESUR NEGATIVE 07/28/2023 2139   LEUKOCYTESUR Negative 10/18/2011 0958      Unresulted Labs (From admission, onward)     Start     Ordered   07/29/23 0500  Comprehensive metabolic panel  Tomorrow morning,   R        07/29/23 0114   07/29/23 0500  CBC  Tomorrow morning,   R        07/29/23 0114   07/29/23 0121  Gamma GT  Once,   R        07/29/23 0120   07/29/23 0115  Hemoglobin A1c  Once,   R       Comments: To assess prior glycemic control    07/29/23 0116   07/29/23 0114  TSH  Once,   R        07/29/23 0114   07/29/23 0114  T4, free  Add-on,   AD        07/29/23 0114   07/29/23 0030  Pancreatic elastase, fecal  Once,   URGENT        07/29/23 0029   07/28/23 2320  Gastrointestinal Panel by PCR , Stool  (Gastrointestinal Panel by PCR, Stool                                                                                                                                                     **Does Not include CLOSTRIDIUM DIFFICILE testing. **If CDIFF testing is needed, place order from the "C Difficile Testing" order set.**)  Once,   URGENT        07/28/23 2320   07/28/23 2320  C Difficile Quick Screen w PCR reflex  (C Difficile quick screen w PCR reflex panel )  Once, for 24 hours,   URGENT       References:    CDiff Information Tool   07/28/23 2320  Medications  pantoprazole (PROTONIX) injection 40 mg (40 mg Intravenous Given  07/29/23 0145)  thiamine (VITAMIN B1) injection 100 mg (100 mg Intravenous Given 07/29/23 0145)  sodium chloride flush (NS) 0.9 % injection 3 mL (3 mLs Intravenous Given 07/29/23 0207)  acetaminophen (TYLENOL) tablet 650 mg (has no administration in time range)    Or  acetaminophen (TYLENOL) suppository 650 mg (has no administration in time range)  heparin injection 5,000 Units (5,000 Units Subcutaneous Given 07/29/23 0232)  insulin aspart (novoLOG) injection 0-15 Units (has no administration in time range)  lactated ringers infusion (has no administration in time range)  sodium chloride 0.9 % bolus 1,000 mL (0 mLs Intravenous Stopped 07/28/23 2140)  sodium chloride 0.9 % bolus 1,000 mL (0 mLs Intravenous Stopped 07/29/23 0114)  potassium chloride 10 mEq in 100 mL IVPB (0 mEq Intravenous Stopped 07/29/23 0323)    Radiological Exams on Admission: CT ABDOMEN PELVIS WO CONTRAST Result Date: 07/28/2023 CLINICAL DATA:  Sepsis diarrhea vomiting EXAM: CT ABDOMEN AND PELVIS WITHOUT CONTRAST TECHNIQUE: Multidetector CT imaging of the abdomen and pelvis was performed following the standard protocol without IV contrast. RADIATION DOSE REDUCTION: This exam was performed according to the departmental dose-optimization program which includes automated exposure control, adjustment of the mA and/or kV according to patient size and/or use of iterative reconstruction technique. COMPARISON:  CT 09/05/2022 FINDINGS: Lower chest: Lung bases demonstrate no acute airspace disease. Coronary vascular calcification. Hepatobiliary: Gallstones. No focal hepatic abnormality. No biliary dilatation Pancreas: Unremarkable. No pancreatic ductal dilatation or surrounding inflammatory changes. Spleen: Normal in size without focal abnormality. Adrenals/Urinary Tract: Adrenal glands are normal. Kidneys show no hydronephrosis. Small nonobstructing left kidney stone. 2.6 cm slightly dense right midpole renal lesion. Bladder partially  obscured by artifact. Stable nonspecific perinephric fat stranding. Stomach/Bowel: Stomach nonenlarged. No dilated small bowel. Negative appendix. Diverticular disease of the left colon. No acute bowel wall thickening Vascular/Lymphatic: Advanced aortic atherosclerosis. No aneurysm. No suspicious lymph nodes. Reproductive: Obscured by artifact Other: Negative for pelvic effusion or free air. Small fat containing umbilical hernia Musculoskeletal: Posterior spinal fusion hardware fell 2 through L4. Right-sided fixing screw at L4 projects lateral to the right vertebral cortex. IMPRESSION: 1. No CT evidence for acute intra-abdominal or pelvic abnormality. 2. Diverticular disease of the left colon without acute inflammatory process. 3. Gallstones. 4. Nonobstructing left kidney stone. 5. 2.6 cm slightly dense right midpole renal lesion, indeterminate without contrast on this exam. When the patient is clinically stable and able to follow directions and hold their breath (preferably as an outpatient) further evaluation with dedicated abdominal MRI should be considered. 6. Aortic atherosclerosis. Aortic Atherosclerosis (ICD10-I70.0). Electronically Signed   By: Jasmine Pang M.D.   On: 07/28/2023 22:12     Data Reviewed: Relevant notes from primary care and specialist visits, past discharge summaries as available in EHR, including Care Everywhere. Prior diagnostic testing as pertinent to current admission diagnoses Updated medications and problem lists for reconciliation ED course, including vitals, labs, imaging, treatment and response to treatment Triage notes, nursing and pharmacy notes and ED provider's notes Notable results as noted in HPI  Assessment and Plan: * Diarrhea Suspect most likely infectious or medication related possibly chronic GI bleed possible, GI consult if persistent.  Stool studies, supportive measures as needed.   Acute kidney injury superimposed on chronic kidney disease (HCC) Lab  Results  Component Value Date   CREATININE 3.41 (H) 07/28/2023   CREATININE 2.28 (H) 12/03/2022   CREATININE 2.24 (H) 11/24/2022  We will  hold patient's Aldactone, losartan, Lasix, continue Imdur and avoid contrast studies.  Intake/Output Summary (Last 24 hours) at 07/29/2023 0333 Last data filed at 07/29/2023 0114 Gross per 24 hour  Intake 2000 ml  Output --  Net 2000 ml  Will continue with LR at 75 x 1 day.     Paroxysmal atrial fibrillation (HCC) Currently irregular and EKG repeat EKG is pending as tracing is poor.  Will continue patient on Coreg 3.125 twice daily. On repeat EKG it looks like pt is in a.fib rvr if pt is tachycardic we will start diltiazem drip and upgrade.  Per last cardiology note in July 2024 states that patient had atrial flutter status post ablation in 2019 in [facility]. Irregularly on EKG will request cardiology to see him while in house.  Tonight we will anticoagulate with heparin tid for DVT prophylaxis.   Essential hypertension Vitals:   07/28/23 1330 07/28/23 1702 07/28/23 2239  BP: (!) 141/98 99/73 (!) 157/85  Will continue patient on Coreg alone for now.  Type 2 diabetes mellitus with chronic kidney disease, without long-term current use of insulin (HCC) Sliding-scale insulin coverage.  Last A1c was controlled.  Electrolyte abnormality Per pharmacy to follow with renal function and replace.    Anemia Mild anemia with a hemoglobin of 12.3 platelet count 437 and normal white count.  Currently stable will follow.  Patient is on iron and folic acid given vitamin B12 supplementation as well.   Asymptomatic gallstones Resume if patient's abdominal pain becomes worse. Mild lipase elevation  Will follow alk phos LFTs and a GGT.? If this is pancreatic malabsorption. Will follow stool studies.      DVT prophylaxis:  Heparin q12h   Consults:  Cardiology : [facility]  Advance Care Planning:    Code Status: Full Code   Family  Communication:  none  Disposition Plan:  Home   Severity of Illness: The appropriate patient status for this patient is INPATIENT. Inpatient status is judged to be reasonable and necessary in order to provide the required intensity of service to ensure the patient's safety. The patient's presenting symptoms, physical exam findings, and initial radiographic and laboratory data in the context of their chronic comorbidities is felt to place them at high risk for further clinical deterioration. Furthermore, it is not anticipated that the patient will be medically stable for discharge from the hospital within 2 midnights of admission.   * I certify that at the point of admission it is my clinical judgment that the patient will require inpatient hospital care spanning beyond 2 midnights from the point of admission due to high intensity of service, high risk for further deterioration and high frequency of surveillance required.*  Author: Gertha Calkin, MD 07/29/2023 3:43 AM  For on call review [email or web address].  Orders Placed This Encounter  Procedures   Resp panel by RT-PCR (RSV, Flu A&B, Covid) Anterior Nasal Swab   Gastrointestinal Panel by PCR , Stool   C Difficile Quick Screen w PCR reflex   CT ABDOMEN PELVIS WO CONTRAST   Lipase, blood   Comprehensive metabolic panel   CBC   Urinalysis, Routine w reflex microscopic -Urine, Clean Catch   Magnesium   TSH   T4, free   Pancreatic elastase, fecal   Comprehensive metabolic panel   CBC   TSH   T4, free   Hemoglobin A1c   Gamma GT   Diet Carb Modified Fluid consistency: Thin; Room service appropriate? Yes   Maintain IV access  Vital signs   Notify physician (specify)   Mobility Protocol: No Restrictions   Refer to Sidebar Report Refer to ICU, Med-Surg, Progressive, and Step-Down Mobility Protocol Sidebars   Daily weights   Intake and Output   Do not place and if present remove PureWick   Initiate Oral Care Protocol   Initiate  Carrier Fluid Protocol   RN may order General Admission PRN Orders utilizing "General Admission PRN medications" (through manage orders) for the following patient needs: allergy symptoms (Claritin), cold sores (Carmex), cough (Robitussin DM), eye irritation (Liquifilm Tears), hemorrhoids (Tucks), indigestion (Maalox), minor skin irritation (Hydrocortisone Cream), muscle pain Romeo Apple Gay), nose irritation (saline nasal spray) and sore throat (Chloraseptic spray).   Cardiac Monitoring Continuous x 48 hours Indications for use: Other; Other indications for use: electrolyte   Apply Diabetes Mellitus Care Plan   STAT CBG when hypoglycemia is suspected. If treated, recheck every 15 minutes after each treatment until CBG >/= 70 mg/dl   Refer to Hypoglycemia Protocol Sidebar Report for treatment of CBG < 70 mg/dl   No HS correction Insulin   Full code   Consult to hospitalist   Electrolytes Per Pharmacy Consult [facility] Only)   Inpatient consult to Cardiology [facility] only - Select consulting group: [facility]; Consult Timeframe: ROUTINE - requires response within 24 hours; Reason for Consult? Irregular heart sounds. ? a/fib or flutter. pt has had ablation./   Enteric precautions (UV disinfection)   Pulse oximetry check with vital signs   Oxygen therapy Mode or (Route): Nasal cannula; Liters Per Minute: 2; Keep O2 saturation between: greater than 92 %   ED EKG   EKG 12-Lead   EKG 12-Lead   Type and screen   Place in observation (patient's expected length of stay will be less than 2 midnights)

## 2023-07-28 NOTE — ED Triage Notes (Signed)
Patient to ED via POV for diarrhea and vomiting since Friday. Having generalized abd pain. States he can keep soft food down.

## 2023-07-28 NOTE — H&P (Incomplete)
History and Physical    Patient: Daniel Kline WUJ:[id number] DOB: [birth year] DOA: 07/28/2023 DOS: the patient was seen and examined on 07/28/2023 PCP: [name], MD  Patient coming from: {Point_of_Origin:[address]}  Chief Complaint:  Chief Complaint  Patient presents with  . Diarrhea    HPI: Daniel Kline is a 77 y.o. male with medical history significant for ***    In emergency room vitals trend shows: Vitals:   07/28/23 1330 07/28/23 1702 07/28/23 1818 07/28/23 2239  BP: (!) 141/98 99/73  (!) 157/85  Pulse: 79 (!) 52  (!) 57  Temp: 97.8 F (36.6 C)  98.3 F (36.8 C) 97.8 F (36.6 C)  Resp: 18 18  17   Height: 5\' 8"  (1.727 m)     Weight: 108.9 kg     SpO2: 94% 97%  100%  TempSrc: Oral  Oral Oral  BMI (Calculated): 36.5        Labs are notable for ***  In the ED pt received: Medications  sodium chloride 0.9 % bolus 1,000 mL (0 mLs Intravenous Stopped 07/28/23 2140)  sodium chloride 0.9 % bolus 1,000 mL (1,000 mLs Intravenous New Bag/Given 07/28/23 2241)     ROS Past Medical History:  Diagnosis Date  . Anemia    H/O  . Anxiety   . Arthritis   . Atrial flutter (HCC)    a. s/p post ablation in 04/2017  . Chronic kidney disease   . Complication of anesthesia   . 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 Diabetes mellitus without complication (HCC)   . Diastolic dysfunction    a. 03/2022 Echo: EF 60-65%, no rwma, GrI DD, nl RV fxn.  Marland Kitchen Dysplastic nevus 12/19/2017   Right distal lat. forearm near wrist. Severe atypia, close to peripheral margin.  Marland Kitchen Dysplastic nevus 06/21/2018   Upper back right paraspinal. Severe atypia, peripheral margin involved. Excised 07/11/2018, margins free.  . Family history of adverse reaction to anesthesia    PT WAS ADOPTED  . HLD (hyperlipidemia)   . HTN (hypertension)   . Hx of dysplastic nevus 2019   multiple sites  . 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 MRSA (methicillin resistant  Staphylococcus aureus)    after back surgery  . OSA (obstructive sleep apnea)    USES BIPAP  . Polio   . 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 PONV (postoperative nausea and vomiting)   . 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 forearm. SCCis, hypertrophic.   Past Surgical History:  Procedure Laterality Date  . BACK SURGERY     LUMBAR  . CARDIAC ELECTROPHYSIOLOGY STUDY AND ABLATION  2019  . CATARACT EXTRACTION W/PHACO Right 05/05/2022   Procedure: CATARACT EXTRACTION PHACO AND INTRAOCULAR LENS PLACEMENT (IOC) RIGHT;  Surgeon: Lockie Mola, MD;  [facility]: [facility];  Service: Ophthalmology;  Laterality: Right;  Diabetic 10.42 01:13.4  . COLONOSCOPY WITH PROPOFOL N/A 04/04/2019   Procedure: COLONOSCOPY WITH PROPOFOL;  Surgeon: Toledo, Boykin Nearing, MD;  Location: [facility];  Service: Gastroenterology;  Laterality: N/A;  . I & D EXTREMITY Right 02/01/2020   Procedure: IRRIGATION AND DEBRIDEMENT EXTREMITY with poly exchange;  Surgeon: Donato Heinz, MD;  Location: [facility] ORS;  Service: Orthopedics;  Laterality: Right;  . INCISION AND DRAINAGE     BACK-MRSA INFECTION AFTER BACK SURGERY  . KNEE ARTHROPLASTY Right 01/28/2020   Procedure: COMPUTER ASSISTED TOTAL KNEE ARTHROPLASTY;  Surgeon: Ernest Pine,  Illene Labrador, MD;  Location: [facility] ORS;  Service: Orthopedics;  Laterality: Right;  . MOUTH SURGERY    . right elbow surgery    . right knee surgery    . right shoulder surgery     from polio damage  . TONSILLECTOMY    . TOTAL HIP ARTHROPLASTY Bilateral 04/2016    reports that he quit smoking about 32 years ago. His smoking use included cigars. He has been exposed to tobacco smoke. He quit smokeless tobacco use about 17 years ago. He reports that he does not drink alcohol and does not use drugs.  Allergies  Allergen Reactions  . Bee Venom Anaphylaxis  . Oxycodone Other (See Comments)    Delusions  . Hydromorphone Other (See Comments)     hallucinating  . Hydroxychloroquine     Other reaction(s): Other (See Comments) He broke out really badly.  . Zolpidem Other (See Comments)    Family History  Adopted: Yes    Prior to Admission medications   Medication Sig Start Date End Date Taking? Authorizing Provider  amLODipine (NORVASC) 5 MG tablet TAKE 1 TABLET BY MOUTH DAILY 06/16/22   [name], MD  amoxicillin-clavulanate (AUGMENTIN) 875-125 MG tablet Take 1 tablet by mouth 2 (two) times daily. Patient not taking: Reported on 07/25/2023 06/17/23   [name], MD  bismuth subsalicylate (PEPTO BISMOL) 262 MG/15ML suspension Take 30 mLs by mouth every 6 (six) hours as needed.    [provider]  calcium carbonate (OSCAL) 1500 (600 Ca) MG TABS tablet Take 600 mg of elemental calcium by mouth daily with breakfast. Patient not taking: Reported on 07/25/2023    [provider]  carvedilol (COREG) 3.125 MG tablet TAKE 1 TABLET BY MOUTH 2 TIMES DAILY WITH A MEAL. 04/21/23   [name], MD  Cholecalciferol (VITAMIN D-3) 25 MCG (1000 UT) CAPS Take 1,000 Units by mouth daily.    [provider]  clorazepate (TRANXENE) 7.5 MG tablet TAKE 1 TABLET BY MOUTH TWICE A DAY AS NEEDED FOR ANXIETY 06/15/23   [name], MD  cyanocobalamin (VITAMIN B12) 500 MCG tablet Take 1,000 mcg by mouth daily.    [provider]  EPINEPHrine 0.3 mg/0.3 mL IJ SOAJ injection Inject 0.3 mg into the muscle as needed for anaphylaxis. 01/27/23   [name], MD  Ferrous Sulfate (IRON PO) Take 1 tablet by mouth at bedtime.    [provider]  folic acid (FOLVITE) 1 MG tablet Take 1 mg by mouth daily. Every day except day that he takes methotrexate 06/11/20   [provider]  furosemide (LASIX) 40 MG tablet Take 1 tablet (40 mg total) by mouth daily. 11/25/22   Creig Hines, NP  gabapentin (NEURONTIN) 300 MG capsule TAKE 1 CAPSULE BY MOUTH 3 TIMES  DAILY 01/31/23   Tillman Abide I, MD  glucose blood (ONETOUCH ULTRA) test strip 1 each by Other route daily. Use to check blood sugar once daily 10/19/22   Tillman Abide I, MD  HYDROcodone-acetaminophen (NORCO/VICODIN) 5-325 MG tablet TAKE 1 TABLET BY MOUTH EVERY 6 HOURS AS NEEDED FOR PAIN 07/18/23   Tillman Abide I, MD  hydrocortisone 2.5 % cream Apply topically 3 (three) times daily as needed. 06/17/23   [name], MD  hydrOXYzine (ATARAX) 25 MG tablet TAKE ONE TABLET EVERY EIGHT HOURS AS NEEDED 03/16/23   [name], MD  isosorbide mononitrate (IMDUR) 60 MG 24 hr tablet Take 1 tablet (60 mg total)  by mouth daily before lunch. 05/11/21   Antonieta Iba, MD  ketoconazole (NIZORAL) 2 % cream Apply 1 Application topically at bedtime. 06/30/23 07/30/23  Deirdre Evener, MD  losartan (COZAAR) 100 MG tablet TAKE 1 TABLET BY MOUTH DAILY 04/21/23   Antonieta Iba, MD  MAGNESIUM PO Take 1 tablet by mouth daily.    [provider]  methotrexate (RHEUMATREX) 2.5 MG tablet Take 2.5 mg by mouth once a week. 02/15/23   [provider]  Multiple Vitamins-Minerals (MULTIVITAL) tablet Take 1 tablet by mouth daily.    [provider]  rosuvastatin (CRESTOR) 10 MG tablet Take 1 tablet (10 mg total) by mouth daily. 04/07/23   [name], MD  Semaglutide,0.25 or 0.5MG /DOS, (OZEMPIC, 0.25 OR 0.5 MG/DOSE,) 2 MG/3ML SOPN Inject 0.25 mg into the skin once a week. 07/01/23   [name], MD  spironolactone (ALDACTONE) 25 MG tablet Take 25 mg by mouth daily.    [provider]  timolol (TIMOPTIC) 0.5 % ophthalmic solution Place 1 drop into both eyes 2 (two) times daily. 01/11/20   [provider]     Vitals:   07/28/23 1330 07/28/23 1702 07/28/23 1818 07/28/23 2239  BP: (!) 141/98 99/73  (!) 157/85  Pulse: 79 (!) 52  (!) 57  Resp: 18 18  17   Temp: 97.8 F (36.6 C)  98.3 F (36.8 C) 97.8 F (36.6 C)  TempSrc: Oral  Oral Oral  SpO2: 94% 97%  100%  Weight: 108.9 kg      Height: 5\' 8"  (1.727 m)      Physical Exam   Labs on Admission: I have personally reviewed following labs and imaging studies Results for orders placed or performed during the hospital encounter of 07/28/23 (from the past 24 hours)  Lipase, blood     Status: Abnormal   Collection Time: 07/28/23  1:33 PM  Result Value Ref Range   Lipase 81 (H) 11 - 51 U/L  Comprehensive metabolic panel     Status: Abnormal   Collection Time: 07/28/23  1:33 PM  Result Value Ref Range   Sodium 134 (L) 135 - 145 mmol/L   Potassium 3.4 (L) 3.5 - 5.1 mmol/L   Chloride 104 98 - 111 mmol/L   CO2 21 (L) 22 - 32 mmol/L   Glucose, Bld 162 (H) 70 - 99 mg/dL   BUN 66 (H) 8 - 23 mg/dL   Creatinine, Ser 8.11 (H) 0.61 - 1.24 mg/dL   Calcium 8.7 (L) 8.9 - 10.3 mg/dL   Total Protein 6.2 (L) 6.5 - 8.1 g/dL   Albumin 3.2 (L) 3.5 - 5.0 g/dL   AST 16 15 - 41 U/L   ALT 28 0 - 44 U/L   Alkaline Phosphatase 58 38 - 126 U/L   Total Bilirubin 0.4 <1.2 mg/dL   GFR, Estimated 18 (L) >60 mL/min   Anion gap 9 5 - 15  CBC     Status: Abnormal   Collection Time: 07/28/23  1:33 PM  Result Value Ref Range   WBC 5.0 4.0 - 10.5 K/uL   RBC 4.04 (L) 4.22 - 5.81 MIL/uL   Hemoglobin 12.3 (L) 13.0 - 17.0 g/dL   HCT 91.4 (L) 78.2 - 95.6 %   MCV 90.6 80.0 - 100.0 fL   MCH 30.4 26.0 - 34.0 pg   MCHC 33.6 30.0 - 36.0 g/dL   RDW 21.3 08.6 - 57.8 %   Platelets 437 (H) 150 - 400 K/uL  nRBC 0.0 0.0 - 0.2 %  Resp panel by RT-PCR (RSV, Flu A&B, Covid) Anterior Nasal Swab     Status: None   Collection Time: 07/28/23  7:04 PM   Specimen: Anterior Nasal Swab  Result Value Ref Range   SARS Coronavirus 2 by RT PCR NEGATIVE NEGATIVE   Influenza A by PCR NEGATIVE NEGATIVE   Influenza B by PCR NEGATIVE NEGATIVE   Resp Syncytial Virus by PCR NEGATIVE NEGATIVE  Urinalysis, Routine w reflex microscopic -Urine, Clean Catch     Status: Abnormal   Collection Time: 07/28/23  9:39 PM  Result Value Ref Range   Color, Urine YELLOW (A) YELLOW    APPearance CLEAR (A) CLEAR   Specific Gravity, Urine 1.017 1.005 - 1.030   pH 5.0 5.0 - 8.0   Glucose, UA 50 (A) NEGATIVE mg/dL   Hgb urine dipstick NEGATIVE NEGATIVE   Bilirubin Urine NEGATIVE NEGATIVE   Ketones, ur NEGATIVE NEGATIVE mg/dL   Protein, ur 962 (A) NEGATIVE mg/dL   Nitrite NEGATIVE NEGATIVE   Leukocytes,Ua NEGATIVE NEGATIVE   RBC / HPF 0-5 0 - 5 RBC/hpf   WBC, UA 0-5 0 - 5 WBC/hpf   Bacteria, UA NONE SEEN NONE SEEN   Squamous Epithelial / HPF 0-5 0 - 5 /HPF   Mucus PRESENT    Hyaline Casts, UA PRESENT     CBC: Recent Labs  Lab 07/28/23 1333  WBC 5.0  HGB 12.3*  HCT 36.6*  MCV 90.6  PLT 437*   Basic Metabolic Panel: Recent Labs  Lab 07/28/23 1333  NA 134*  K 3.4*  CL 104  CO2 21*  GLUCOSE 162*  BUN 66*  CREATININE 3.41*  CALCIUM 8.7*   GFR: Estimated Creatinine Clearance: 21.4 mL/min (A) (by C-G formula based on SCr of 3.41 mg/dL (H)). Liver Function Tests: Recent Labs  Lab 07/28/23 1333  AST 16  ALT 28  ALKPHOS 58  BILITOT 0.4  PROT 6.2*  ALBUMIN 3.2*   Recent Labs  Lab 07/28/23 1333  LIPASE 81*   No results for input(s): "AMMONIA" in the last 168 hours. Coagulation Profile: No results for input(s): "INR", "PROTIME" in the last 168 hours. Cardiac Enzymes: No results for input(s): "CKTOTAL", "CKMB", "CKMBINDEX", "TROPONINI" in the last 168 hours. BNP (last 3 results) No results for input(s): "PROBNP" in the last 8760 hours. HbA1C: No results for input(s): "HGBA1C" in the last 72 hours. CBG: No results for input(s): "GLUCAP" in the last 168 hours. Lipid Profile: No results for input(s): "CHOL", "HDL", "LDLCALC", "TRIG", "CHOLHDL", "LDLDIRECT" in the last 72 hours. Thyroid Function Tests: No results for input(s): "TSH", "T4TOTAL", "FREET4", "T3FREE", "THYROIDAB" in the last 72 hours. Anemia Panel: No results for input(s): "VITAMINB12", "FOLATE", "FERRITIN", "TIBC", "IRON", "RETICCTPCT" in the last 72 hours. Urinalysis     Component Value Date/Time   COLORURINE YELLOW (A) 07/28/2023 2139   APPEARANCEUR CLEAR (A) 07/28/2023 2139   APPEARANCEUR Clear 10/18/2011 0958   LABSPEC 1.017 07/28/2023 2139   LABSPEC 1.021 10/18/2011 0958   PHURINE 5.0 07/28/2023 2139   GLUCOSEU 50 (A) 07/28/2023 2139   GLUCOSEU Negative 10/18/2011 0958   HGBUR NEGATIVE 07/28/2023 2139   BILIRUBINUR NEGATIVE 07/28/2023 2139   BILIRUBINUR Negative 10/18/2011 0958   KETONESUR NEGATIVE 07/28/2023 2139   PROTEINUR 100 (A) 07/28/2023 2139   NITRITE NEGATIVE 07/28/2023 2139   LEUKOCYTESUR NEGATIVE 07/28/2023 2139   LEUKOCYTESUR Negative 10/18/2011 0958      Unresulted Labs (From admission, onward)    None  Medications  sodium chloride 0.9 % bolus 1,000 mL (0 mLs Intravenous Stopped 07/28/23 2140)  sodium chloride 0.9 % bolus 1,000 mL (1,000 mLs Intravenous New Bag/Given 07/28/23 2241)    Radiological Exams on Admission: CT ABDOMEN PELVIS WO CONTRAST Result Date: 07/28/2023 CLINICAL DATA:  Sepsis diarrhea vomiting EXAM: CT ABDOMEN AND PELVIS WITHOUT CONTRAST TECHNIQUE: Multidetector CT imaging of the abdomen and pelvis was performed following the standard protocol without IV contrast. RADIATION DOSE REDUCTION: This exam was performed according to the departmental dose-optimization program which includes automated exposure control, adjustment of the mA and/or kV according to patient size and/or use of iterative reconstruction technique. COMPARISON:  CT 09/05/2022 FINDINGS: Lower chest: Lung bases demonstrate no acute airspace disease. Coronary vascular calcification. Hepatobiliary: Gallstones. No focal hepatic abnormality. No biliary dilatation Pancreas: Unremarkable. No pancreatic ductal dilatation or surrounding inflammatory changes. Spleen: Normal in size without focal abnormality. Adrenals/Urinary Tract: Adrenal glands are normal. Kidneys show no hydronephrosis. Small nonobstructing left kidney stone. 2.6 cm slightly dense  right midpole renal lesion. Bladder partially obscured by artifact. Stable nonspecific perinephric fat stranding. Stomach/Bowel: Stomach nonenlarged. No dilated small bowel. Negative appendix. Diverticular disease of the left colon. No acute bowel wall thickening Vascular/Lymphatic: Advanced aortic atherosclerosis. No aneurysm. No suspicious lymph nodes. Reproductive: Obscured by artifact Other: Negative for pelvic effusion or free air. Small fat containing umbilical hernia Musculoskeletal: Posterior spinal fusion hardware fell 2 through L4. Right-sided fixing screw at L4 projects lateral to the right vertebral cortex. IMPRESSION: 1. No CT evidence for acute intra-abdominal or pelvic abnormality. 2. Diverticular disease of the left colon without acute inflammatory process. 3. Gallstones. 4. Nonobstructing left kidney stone. 5. 2.6 cm slightly dense right midpole renal lesion, indeterminate without contrast on this exam. When the patient is clinically stable and able to follow directions and hold their breath (preferably as an outpatient) further evaluation with dedicated abdominal MRI should be considered. 6. Aortic atherosclerosis. Aortic Atherosclerosis (ICD10-I70.0). Electronically Signed   By: Jasmine Pang M.D.   On: 07/28/2023 22:12     Data Reviewed: Relevant notes from primary care and specialist visits, past discharge summaries as available in EHR, including Care Everywhere. Prior diagnostic testing as pertinent to current admission diagnoses Updated medications and problem lists for reconciliation ED course, including vitals, labs, imaging, treatment and response to treatment Triage notes, nursing and pharmacy notes and ED provider's notes Notable results as noted in HPI  Assessment and Plan: No notes have been filed under this hospital service. Service: Hospitalist       Prognosis: ***  DVT prophylaxis:  ***  Consults:  ***  Advance Care Planning:    Code Status: Prior    Family Communication:  ***  Disposition Plan:  ***  Severity of Illness: {Observation/Inpatient:21159}  Author: Gertha Calkin, MD 07/28/2023 11:17 PM  For on call review [email or web address].

## 2023-07-28 NOTE — ED Provider Notes (Signed)
[facility] Provider Note   Event Date/Time   First MD Initiated Contact with Patient 07/28/23 1809     (approximate) History  Diarrhea  HPI Daniel Kline is a 77 y.o. male with stated past medical history of CKD, hypertension, type 2 diabetes, and poliomyelitis who presents complaining of diarrhea, vomiting, and nausea since for the last 4 days.  Patient also complains of generalized abdominal pain and has recently been able to keep soft food down.  Patient denies any recent travel or sick contacts.  Patient denies any food out of the ordinary. ROS: Patient currently denies any vision changes, tinnitus, difficulty speaking, facial droop, sore throat, chest pain, shortness of breath, dysuria, or weakness/numbness/paresthesias in any extremity   Physical Exam  Triage Vital Signs: ED Triage Vitals [07/28/23 1330]  Encounter Vitals Group     BP (!) 141/98     Systolic BP Percentile      Diastolic BP Percentile      Pulse Rate 79     Resp 18     Temp 97.8 F (36.6 C)     Temp Source Oral     SpO2 94 %     Weight 240 lb (108.9 kg)     Height 5\' 8"  (1.727 m)     Head Circumference      Peak Flow      Pain Score 0     Pain Loc      Pain Education      Exclude from Growth Chart    Most recent vital signs: Vitals:   07/28/23 1818 07/28/23 2239  BP:  (!) 157/85  Pulse:  (!) 57  Resp:  17  Temp: 98.3 F (36.8 C) 97.8 F (36.6 C)  SpO2:  100%   General: Awake, oriented x4. CV:  Good peripheral perfusion.  Resp:  Normal effort.  Abd:  No distention.  Nontender to palpation Other:  Elderly obese Caucasian male resting comfortably in no acute distress ED Results / Procedures / Treatments  Labs (all labs ordered are listed, but only abnormal results are displayed) Labs Reviewed  LIPASE, BLOOD - Abnormal; Notable for the following components:      Result Value   Lipase 81 (*)    All other components within normal limits  COMPREHENSIVE METABOLIC  PANEL - Abnormal; Notable for the following components:   Sodium 134 (*)    Potassium 3.4 (*)    CO2 21 (*)    Glucose, Bld 162 (*)    BUN 66 (*)    Creatinine, Ser 3.41 (*)    Calcium 8.7 (*)    Total Protein 6.2 (*)    Albumin 3.2 (*)    GFR, Estimated 18 (*)    All other components within normal limits  CBC - Abnormal; Notable for the following components:   RBC 4.04 (*)    Hemoglobin 12.3 (*)    HCT 36.6 (*)    Platelets 437 (*)    All other components within normal limits  URINALYSIS, ROUTINE W REFLEX MICROSCOPIC - Abnormal; Notable for the following components:   Color, Urine YELLOW (*)    APPearance CLEAR (*)    Glucose, UA 50 (*)    Protein, ur 100 (*)    All other components within normal limits  RESP PANEL BY RT-PCR (RSV, FLU A&B, COVID)  [id number]  GASTROINTESTINAL PANEL BY PCR, STOOL (REPLACES STOOL CULTURE)  C DIFFICILE QUICK SCREEN W PCR REFLEX  RADIOLOGY ED MD interpretation: CT of the abdomen and pelvis without IV contrast shows no evidence of acute intra-abdominal or pelvic abnormality -Agree with radiology assessment Official radiology report(s): CT ABDOMEN PELVIS WO CONTRAST Result Date: 07/28/2023 CLINICAL DATA:  Sepsis diarrhea vomiting EXAM: CT ABDOMEN AND PELVIS WITHOUT CONTRAST TECHNIQUE: Multidetector CT imaging of the abdomen and pelvis was performed following the standard protocol without IV contrast. RADIATION DOSE REDUCTION: This exam was performed according to the departmental dose-optimization program which includes automated exposure control, adjustment of the mA and/or kV according to patient size and/or use of iterative reconstruction technique. COMPARISON:  CT 09/05/2022 FINDINGS: Lower chest: Lung bases demonstrate no acute airspace disease. Coronary vascular calcification. Hepatobiliary: Gallstones. No focal hepatic abnormality. No biliary dilatation Pancreas: Unremarkable. No pancreatic ductal dilatation or surrounding inflammatory changes.  Spleen: Normal in size without focal abnormality. Adrenals/Urinary Tract: Adrenal glands are normal. Kidneys show no hydronephrosis. Small nonobstructing left kidney stone. 2.6 cm slightly dense right midpole renal lesion. Bladder partially obscured by artifact. Stable nonspecific perinephric fat stranding. Stomach/Bowel: Stomach nonenlarged. No dilated small bowel. Negative appendix. Diverticular disease of the left colon. No acute bowel wall thickening Vascular/Lymphatic: Advanced aortic atherosclerosis. No aneurysm. No suspicious lymph nodes. Reproductive: Obscured by artifact Other: Negative for pelvic effusion or free air. Small fat containing umbilical hernia Musculoskeletal: Posterior spinal fusion hardware fell 2 through L4. Right-sided fixing screw at L4 projects lateral to the right vertebral cortex. IMPRESSION: 1. No CT evidence for acute intra-abdominal or pelvic abnormality. 2. Diverticular disease of the left colon without acute inflammatory process. 3. Gallstones. 4. Nonobstructing left kidney stone. 5. 2.6 cm slightly dense right midpole renal lesion, indeterminate without contrast on this exam. When the patient is clinically stable and able to follow directions and hold their breath (preferably as an outpatient) further evaluation with dedicated abdominal MRI should be considered. 6. Aortic atherosclerosis. Aortic Atherosclerosis (ICD10-I70.0). Electronically Signed   By: Jasmine Pang M.D.   On: 07/28/2023 22:12   PROCEDURES: Critical Care performed: No Procedures MEDICATIONS ORDERED IN ED: Medications  sodium chloride 0.9 % bolus 1,000 mL (0 mLs Intravenous Stopped 07/28/23 2140)  sodium chloride 0.9 % bolus 1,000 mL (1,000 mLs Intravenous New Bag/Given 07/28/23 2241)   IMPRESSION / MDM / ASSESSMENT AND PLAN / ED COURSE  I reviewed the triage vital signs and the nursing notes.                             The patient is on the cardiac monitor to evaluate for evidence of arrhythmia  and/or significant heart rate changes. Patient's presentation is most consistent with acute presentation with potential threat to life or bodily function. This patient presents with generalized weakness and fatigue likely secondary to dehydration. Suspect acute kidney injury of prerenal origin. Doubt intrinsic renal dysfunction or obstructive nephropathy. Considered alternate etiologies of the patient's symptoms including infectious processes, severe metabolic derangements or electrolyte abnormalities, ischemia/ACS, heart failure, and intracranial/central processes but think these are unlikely given the history and physical exam.  Plan: labs, 2L NS fluid resuscitation, pain/nausea control, reassessment  Dispo: Admit to medicine   FINAL CLINICAL IMPRESSION(S) / ED DIAGNOSES   Final diagnoses:  Acute renal failure superimposed on chronic kidney disease, unspecified acute renal failure type, unspecified CKD stage (HCC)  Diarrhea of presumed infectious origin   Rx / DC Orders   ED Discharge Orders     None      Note:  This document was prepared using Dragon voice recognition software and may include unintentional dictation errors.   Merwyn Katos, MD 07/28/23 [phone]

## 2023-07-28 NOTE — ED Notes (Signed)
Patient wife states that he diarrhea, and the patient took an anti diarrhea medication and it helped

## 2023-07-29 DIAGNOSIS — R197 Diarrhea, unspecified: Secondary | ICD-10-CM | POA: Diagnosis present

## 2023-07-29 DIAGNOSIS — N1832 Chronic kidney disease, stage 3b: Secondary | ICD-10-CM | POA: Diagnosis present

## 2023-07-29 DIAGNOSIS — Z6836 Body mass index (BMI) 36.0-36.9, adult: Secondary | ICD-10-CM | POA: Diagnosis not present

## 2023-07-29 DIAGNOSIS — Z96651 Presence of right artificial knee joint: Secondary | ICD-10-CM | POA: Diagnosis present

## 2023-07-29 DIAGNOSIS — E669 Obesity, unspecified: Secondary | ICD-10-CM | POA: Diagnosis present

## 2023-07-29 DIAGNOSIS — Z8572 Personal history of non-Hodgkin lymphomas: Secondary | ICD-10-CM | POA: Diagnosis not present

## 2023-07-29 DIAGNOSIS — Z1152 Encounter for screening for COVID-19: Secondary | ICD-10-CM | POA: Diagnosis not present

## 2023-07-29 DIAGNOSIS — E1122 Type 2 diabetes mellitus with diabetic chronic kidney disease: Secondary | ICD-10-CM | POA: Diagnosis present

## 2023-07-29 DIAGNOSIS — E878 Other disorders of electrolyte and fluid balance, not elsewhere classified: Secondary | ICD-10-CM | POA: Diagnosis present

## 2023-07-29 DIAGNOSIS — N179 Acute kidney failure, unspecified: Secondary | ICD-10-CM | POA: Diagnosis not present

## 2023-07-29 DIAGNOSIS — Z8614 Personal history of Methicillin resistant Staphylococcus aureus infection: Secondary | ICD-10-CM | POA: Diagnosis not present

## 2023-07-29 DIAGNOSIS — A09 Infectious gastroenteritis and colitis, unspecified: Secondary | ICD-10-CM | POA: Diagnosis present

## 2023-07-29 DIAGNOSIS — E1165 Type 2 diabetes mellitus with hyperglycemia: Secondary | ICD-10-CM | POA: Diagnosis present

## 2023-07-29 DIAGNOSIS — Z85828 Personal history of other malignant neoplasm of skin: Secondary | ICD-10-CM | POA: Diagnosis not present

## 2023-07-29 DIAGNOSIS — E876 Hypokalemia: Secondary | ICD-10-CM | POA: Diagnosis present

## 2023-07-29 DIAGNOSIS — Z961 Presence of intraocular lens: Secondary | ICD-10-CM | POA: Diagnosis present

## 2023-07-29 DIAGNOSIS — I130 Hypertensive heart and chronic kidney disease with heart failure and stage 1 through stage 4 chronic kidney disease, or unspecified chronic kidney disease: Secondary | ICD-10-CM | POA: Diagnosis present

## 2023-07-29 DIAGNOSIS — I491 Atrial premature depolarization: Secondary | ICD-10-CM

## 2023-07-29 DIAGNOSIS — I480 Paroxysmal atrial fibrillation: Secondary | ICD-10-CM | POA: Diagnosis present

## 2023-07-29 DIAGNOSIS — Z87891 Personal history of nicotine dependence: Secondary | ICD-10-CM | POA: Diagnosis not present

## 2023-07-29 DIAGNOSIS — D75839 Thrombocytosis, unspecified: Secondary | ICD-10-CM | POA: Diagnosis present

## 2023-07-29 DIAGNOSIS — Z9841 Cataract extraction status, right eye: Secondary | ICD-10-CM | POA: Diagnosis not present

## 2023-07-29 DIAGNOSIS — Z8612 Personal history of poliomyelitis: Secondary | ICD-10-CM | POA: Diagnosis not present

## 2023-07-29 DIAGNOSIS — D631 Anemia in chronic kidney disease: Secondary | ICD-10-CM | POA: Diagnosis present

## 2023-07-29 DIAGNOSIS — G4733 Obstructive sleep apnea (adult) (pediatric): Secondary | ICD-10-CM | POA: Diagnosis present

## 2023-07-29 DIAGNOSIS — E785 Hyperlipidemia, unspecified: Secondary | ICD-10-CM | POA: Diagnosis present

## 2023-07-29 DIAGNOSIS — Z8679 Personal history of other diseases of the circulatory system: Secondary | ICD-10-CM

## 2023-07-29 DIAGNOSIS — I5032 Chronic diastolic (congestive) heart failure: Secondary | ICD-10-CM | POA: Diagnosis present

## 2023-07-29 LAB — COMPREHENSIVE METABOLIC PANEL
ALT: 26 U/L (ref 0–44)
AST: 19 U/L (ref 15–41)
Albumin: 2.7 g/dL — ABNORMAL LOW (ref 3.5–5.0)
Alkaline Phosphatase: 48 U/L (ref 38–126)
Anion gap: 7 (ref 5–15)
BUN: 63 mg/dL — ABNORMAL HIGH (ref 8–23)
CO2: 21 mmol/L — ABNORMAL LOW (ref 22–32)
Calcium: 7.9 mg/dL — ABNORMAL LOW (ref 8.9–10.3)
Chloride: 108 mmol/L (ref 98–111)
Creatinine, Ser: 3.08 mg/dL — ABNORMAL HIGH (ref 0.61–1.24)
GFR, Estimated: 20 mL/min — ABNORMAL LOW (ref >60)
Glucose, Bld: 141 mg/dL — ABNORMAL HIGH (ref 70–99)
Potassium: 3.1 mmol/L — ABNORMAL LOW (ref 3.5–5.1)
Sodium: 136 mmol/L (ref 135–145)
Total Bilirubin: 0.5 mg/dL (ref <1.2)
Total Protein: 5 g/dL — ABNORMAL LOW (ref 6.5–8.1)

## 2023-07-29 LAB — CBG MONITORING, ED
Glucose-Capillary: 88 mg/dL (ref 70–99)
Glucose-Capillary: 87 mg/dL (ref 70–99)
Glucose-Capillary: 141 mg/dL — ABNORMAL HIGH (ref 70–99)
Glucose-Capillary: 126 mg/dL — ABNORMAL HIGH (ref 70–99)

## 2023-07-29 LAB — CBC
HCT: 30.7 % — ABNORMAL LOW (ref 39.0–52.0)
Hemoglobin: 10.4 g/dL — ABNORMAL LOW (ref 13.0–17.0)
MCH: 31.1 pg (ref 26.0–34.0)
MCHC: 33.9 g/dL (ref 30.0–36.0)
MCV: 91.9 fL (ref 80.0–100.0)
Platelets: 433 10*3/uL — ABNORMAL HIGH (ref 150–400)
RBC: 3.34 MIL/uL — ABNORMAL LOW (ref 4.22–5.81)
RDW: 14.8 % (ref 11.5–15.5)
WBC: 5.7 10*3/uL (ref 4.0–10.5)
nRBC: 0 % (ref 0.0–0.2)

## 2023-07-29 LAB — MAGNESIUM: Magnesium: 2.7 mg/dL — ABNORMAL HIGH (ref 1.7–2.4)

## 2023-07-29 LAB — GLUCOSE, CAPILLARY
Glucose-Capillary: 104 mg/dL — ABNORMAL HIGH (ref 70–99)
Glucose-Capillary: 116 mg/dL — ABNORMAL HIGH (ref 70–99)

## 2023-07-29 LAB — TSH
TSH: 0.878 u[IU]/mL (ref 0.350–4.500)
TSH: 0.603 u[IU]/mL (ref 0.350–4.500)

## 2023-07-29 LAB — TYPE AND SCREEN
ABO/RH(D): O POS
Antibody Screen: NEGATIVE

## 2023-07-29 LAB — HEMOGLOBIN A1C
Hgb A1c MFr Bld: 9 % — ABNORMAL HIGH (ref 4.8–5.6)
Mean Plasma Glucose: 211.6 mg/dL

## 2023-07-29 LAB — T4, FREE
Free T4: 1.08 ng/dL (ref 0.61–1.12)
Free T4: 1.08 ng/dL (ref 0.61–1.12)

## 2023-07-29 LAB — POTASSIUM: Potassium: 4 mmol/L (ref 3.5–5.1)

## 2023-07-29 LAB — GAMMA GT: GGT: 38 U/L (ref 7–50)

## 2023-07-29 MED ORDER — LACTATED RINGERS IV SOLN
INTRAVENOUS | Status: AC
Start: 2023-07-29 — End: 2023-07-30

## 2023-07-29 MED ORDER — THIAMINE HCL 100 MG/ML IJ SOLN
100.0000 mg | INTRAMUSCULAR | Status: DC
  Administered 2023-07-29 – 2023-07-31 (×3): 100 mg via INTRAVENOUS
  Filled 2023-07-29 (×3): qty 2

## 2023-07-29 MED ORDER — POTASSIUM CHLORIDE CRYS ER 20 MEQ PO TBCR
20.0000 meq | EXTENDED_RELEASE_TABLET | Freq: Once | ORAL | Status: AC
  Administered 2023-07-29: 20 meq via ORAL
  Filled 2023-07-29: qty 1

## 2023-07-29 MED ORDER — POTASSIUM CHLORIDE 10 MEQ/100ML IV SOLN
10.0000 meq | INTRAVENOUS | Status: AC
  Administered 2023-07-29 (×2): 10 meq via INTRAVENOUS
  Filled 2023-07-29: qty 100

## 2023-07-29 MED ORDER — HEPARIN SODIUM (PORCINE) 5000 UNIT/ML IJ SOLN
5000.0000 [IU] | Freq: Two times a day (BID) | INTRAMUSCULAR | Status: DC
  Administered 2023-07-29 – 2023-07-31 (×6): 5000 [IU] via SUBCUTANEOUS
  Filled 2023-07-29 (×6): qty 1

## 2023-07-29 MED ORDER — ACETAMINOPHEN 650 MG RE SUPP: 650.0000 mg | Freq: Four times a day (QID) | RECTAL | Status: DC | PRN

## 2023-07-29 MED ORDER — INSULIN ASPART 100 UNIT/ML IJ SOLN
0.0000 [IU] | INTRAMUSCULAR | Status: DC | PRN
  Administered 2023-07-29 (×2): 2 [IU] via SUBCUTANEOUS
  Filled 2023-07-29 (×2): qty 1

## 2023-07-29 MED ORDER — SODIUM CHLORIDE 0.9% FLUSH
3.0000 mL | Freq: Two times a day (BID) | INTRAVENOUS | Status: DC
  Administered 2023-07-29 – 2023-07-31 (×5): 3 mL via INTRAVENOUS

## 2023-07-29 MED ORDER — PANTOPRAZOLE SODIUM 40 MG IV SOLR
40.0000 mg | Freq: Two times a day (BID) | INTRAVENOUS | Status: DC
Start: 2023-07-29 — End: 2023-07-31
  Administered 2023-07-29 – 2023-07-31 (×6): 40 mg via INTRAVENOUS
  Filled 2023-07-29 (×6): qty 10

## 2023-07-29 MED ORDER — ACETAMINOPHEN 325 MG PO TABS: 650.0000 mg | ORAL_TABLET | Freq: Four times a day (QID) | ORAL | Status: DC | PRN

## 2023-07-29 NOTE — ED Notes (Signed)
Pt assisted to recliner at bedside to increase comfort. Pt reports being more comfortable. Wife remains at bedside. CB remains within reach. Pt eating dinner.

## 2023-07-29 NOTE — Assessment & Plan Note (Addendum)
Suspect most likely infectious or medication related possibly chronic GI bleed possible, GI consult if persistent.  Stool studies, supportive measures as needed.

## 2023-07-29 NOTE — ED Notes (Signed)
MD Mayford Knife at patient bedside

## 2023-07-29 NOTE — Progress Notes (Signed)
PHARMACY CONSULT NOTE - FOLLOW UP  Pharmacy Consult for Electrolyte Monitoring and Replacement   Recent Labs: Potassium (mmol/L)  Date Value  07/28/2023 3.4 (L)  10/28/2011 3.9   Magnesium (mg/dL)  Date Value  56/21/3086 2.5 (H)   Calcium (mg/dL)  Date Value  57/84/6962 8.7 (L)   Calcium, Total (mg/dL)  Date Value  95/28/4132 7.9 (L)   Albumin (g/dL)  Date Value  44/08/270 3.2 (L)   Phosphorus (mg/dL)  Date Value  53/66/4403 2.8   Sodium (mmol/L)  Date Value  07/28/2023 134 (L)  10/28/2011 141     Assessment: 12/19 @ 1333:  K = 3.4 -  Pt presents with N/V and diarrhea; will use IV until N/V resolves  Goal of Therapy:  Electrolytes WNL   Plan:  KCl 10 mEq IV X 2 ordered for 12/20 @ 0100. - recheck electrolytes on 12/20 AM   Mansfield Dann D ,PharmD Clinical Pharmacist 07/29/2023 12:50 AM

## 2023-07-29 NOTE — Assessment & Plan Note (Signed)
Sliding-scale insulin coverage.  Last A1c was controlled.

## 2023-07-29 NOTE — Assessment & Plan Note (Signed)
Vitals:   07/28/23 1330 07/28/23 1702 07/28/23 2239  BP: (!) 141/98 99/73 (!) 157/85  Will continue patient on Coreg alone for now.

## 2023-07-29 NOTE — ED Notes (Signed)
Pt assisted with repositioning in bed. Wife remains at bedside. CB remains within reach. Monitoring in place. Pt encouraged to alert staff to any needs.

## 2023-07-29 NOTE — Progress Notes (Signed)
PHARMACY CONSULT NOTE - FOLLOW UP  Pharmacy Consult for Electrolyte Monitoring and Replacement   Recent Labs: Potassium (mmol/L)  Date Value  07/29/2023 3.1 (L)  10/28/2011 3.9   Magnesium (mg/dL)  Date Value  57/84/6962 2.7 (H)   Calcium (mg/dL)  Date Value  95/28/4132 7.9 (L)   Calcium, Total (mg/dL)  Date Value  44/08/270 7.9 (L)   Albumin (g/dL)  Date Value  53/66/4403 2.7 (L)   Phosphorus (mg/dL)  Date Value  47/42/5956 2.8   Sodium (mmol/L)  Date Value  07/29/2023 136  10/28/2011 141     Assessment: -  Pt presents with N/V and diarrhea. Hx: atrial flutter status post catheter ablation in September 2018, chronic HFpEF, hypertension, hyperlipidemia, diabetes, stage III chronic kidney disease, cutaneous T-cell lymphoma, chronic low back pain, and sleep apnea on CPAP,   Goal of Therapy:  Electrolytes WNL   Plan:  K 3.1  Scr 3.08    Will order KCL 20 meq PO x1.   Conservative dose in light of renal fxn   - f/u electrolytes with am labs  Angelique Blonder ,PharmD Clinical Pharmacist 07/29/2023 8:59 AM

## 2023-07-29 NOTE — ED Notes (Signed)
Attempted to reach patient's wife via telephone to give her an update on pt per her request without success.

## 2023-07-29 NOTE — ED Notes (Signed)
Spoke with patient's wife and informed her that the patient is still in the ER, and waiting for an admission bed to come available.

## 2023-07-29 NOTE — Assessment & Plan Note (Signed)
Per pharmacy to follow with renal function and replace.

## 2023-07-29 NOTE — Assessment & Plan Note (Addendum)
Resume if patient's abdominal pain becomes worse. Mild lipase elevation  Will follow alk phos LFTs and a GGT.? If this is pancreatic malabsorption. Will follow stool studies.

## 2023-07-29 NOTE — Assessment & Plan Note (Addendum)
Lab Results  Component Value Date   CREATININE 3.41 (H) 07/28/2023   CREATININE 2.28 (H) 12/03/2022   CREATININE 2.24 (H) 11/24/2022  We will hold patient's Aldactone, losartan, Lasix, continue Imdur and avoid contrast studies.  Intake/Output Summary (Last 24 hours) at 07/29/2023 0333 Last data filed at 07/29/2023 0114 Gross per 24 hour  Intake 2000 ml  Output --  Net 2000 ml  Will continue with LR at 75 x 1 day.

## 2023-07-29 NOTE — ED Notes (Signed)
Patient given a breakfast tray. Pt repositioned sitting on the side of bed with food on bedside table, eating breakfast.

## 2023-07-29 NOTE — Progress Notes (Signed)
PROGRESS NOTE    Daniel Kline  QMV:[id number] DOB: [birth year] DOA: 07/28/2023 PCP: [name], MD   Assessment & Plan:   Principal Problem:   Diarrhea Active Problems:   Paroxysmal atrial fibrillation (HCC)   Acute kidney injury superimposed on chronic kidney disease (HCC)   Essential hypertension   Type 2 diabetes mellitus with chronic kidney disease, without long-term current use of insulin (HCC)   Asymptomatic gallstones   Anemia   Electrolyte abnormality   Personal history of atrial flutter  Assessment and Plan: AKI on CKDIII: baseline Cr is unknown possibly IIIa vs IIIb. Cr is trending down from day. Continue on IVFs.  Hx A. Flutter: typical as per cardio. No evidence of reoccurrence as per cardio. Cardio recs apprec   Diarrhea: GI PCR panel & c. Diff ordered but not collected yet. No diarrhea today so far   DM2: likely poorly controlled. Continue on SSI w/ accuchecks   Hypokalemia: potassium given   Normocytic anemia: H&H are trending down. Will transfuse if Hb < 7.0   Renal lesion: incidental finding on CT which recommends for MRI but pt says he has metal in his body so will likely need repeat CT as an outpatient to f/u. Pt verbalized his understanding   Obesity: BMI 36.4. would benefit from weight loss   DVT prophylaxis: heparin SQ Code Status: full  Family Communication:  Disposition Plan: likely d/c back home   Level of care: Telemetry Medical  Status is: Inpatient Remains inpatient appropriate because: severity of illness    Consultants:  Cardio   Procedures:   Antimicrobials:   Subjective: Pt c/o malaise   Objective: Vitals:   07/28/23 1818 07/28/23 2239 07/29/23 0330 07/29/23 0530  BP:  (!) 157/85 (!) 141/85 134/83  Pulse:  (!) 57 87 (!) 50  Resp:  17 15 14   Temp: 98.3 F (36.8 C) 97.8 F (36.6 C)  98.1 F (36.7 C)  TempSrc: Oral Oral  Oral  SpO2:  100% 100% 100%  Weight:      Height:        Intake/Output Summary  (Last 24 hours) at 07/29/2023 0856 Last data filed at 07/29/2023 0344 Gross per 24 hour  Intake 2000 ml  Output 225 ml  Net 1775 ml   Filed Weights   07/28/23 1330  Weight: 108.9 kg    Examination:  General exam: Appears calm and comfortable  Respiratory system: Clear to auscultation. Respiratory effort normal. Cardiovascular system: S1 & S2+. No rubs, gallops or clicks. Gastrointestinal system: Abdomen is nondistended, soft and nontender.Normal bowel sounds heard. Central nervous system: Alert and oriented. Psychiatry: Judgement and insight appears poor. Mood & affect appropriate.     Data Reviewed: I have personally reviewed following labs and imaging studies  CBC: Recent Labs  Lab 07/28/23 1333 07/29/23 0332  WBC 5.0 5.7  HGB 12.3* 10.4*  HCT 36.6* 30.7*  MCV 90.6 91.9  PLT 437* 433*   Basic Metabolic Panel: Recent Labs  Lab 07/28/23 1333 07/29/23 0332  NA 134* 136  K 3.4* 3.1*  CL 104 108  CO2 21* 21*  GLUCOSE 162* 141*  BUN 66* 63*  CREATININE 3.41* 3.08*  CALCIUM 8.7* 7.9*  MG 2.7*  --    GFR: Estimated Creatinine Clearance: 23.7 mL/min (A) (by C-G formula based on SCr of 3.08 mg/dL (H)). Liver Function Tests: Recent Labs  Lab 07/28/23 1333 07/29/23 0332  AST 16 19  ALT 28 26  ALKPHOS 58 48  BILITOT 0.4  0.5  PROT 6.2* 5.0*  ALBUMIN 3.2* 2.7*   Recent Labs  Lab 07/28/23 1333  LIPASE 81*   No results for input(s): "AMMONIA" in the last 168 hours. Coagulation Profile: No results for input(s): "INR", "PROTIME" in the last 168 hours. Cardiac Enzymes: No results for input(s): "CKTOTAL", "CKMB", "CKMBINDEX", "TROPONINI" in the last 168 hours. BNP (last 3 results) No results for input(s): "PROBNP" in the last 8760 hours. HbA1C: Recent Labs    07/28/23 1333  HGBA1C 9.0*   CBG: Recent Labs  Lab 07/29/23 0455  GLUCAP 88   Lipid Profile: No results for input(s): "CHOL", "HDL", "LDLCALC", "TRIG", "CHOLHDL", "LDLDIRECT" in the last 72  hours. Thyroid Function Tests: Recent Labs    07/29/23 0332  TSH 0.603  FREET4 1.08   Anemia Panel: No results for input(s): "VITAMINB12", "FOLATE", "FERRITIN", "TIBC", "IRON", "RETICCTPCT" in the last 72 hours. Sepsis Labs: No results for input(s): "PROCALCITON", "LATICACIDVEN" in the last 168 hours.  Recent Results (from the past 240 hours)  Resp panel by RT-PCR (RSV, Flu A&B, Covid) Anterior Nasal Swab     Status: None   Collection Time: 07/28/23  7:04 PM   Specimen: Anterior Nasal Swab  Result Value Ref Range Status   SARS Coronavirus 2 by RT PCR NEGATIVE NEGATIVE Final    Comment: (NOTE) SARS-CoV-2 target nucleic acids are NOT DETECTED.  The SARS-CoV-2 RNA is generally detectable in upper respiratory specimens during the acute phase of infection. The lowest concentration of SARS-CoV-2 viral copies this assay can detect is 138 copies/mL. A negative result does not preclude SARS-Cov-2 infection and should not be used as the sole basis for treatment or other patient management decisions. A negative result may occur with  improper specimen collection/handling, submission of specimen other than nasopharyngeal swab, presence of viral mutation(s) within the areas targeted by this assay, and inadequate number of viral copies(<138 copies/mL). A negative result must be combined with clinical observations, patient history, and epidemiological information. The expected result is Negative.  Fact Sheet for Patients:  BloggerCourse.com  Fact Sheet for Healthcare Providers:  SeriousBroker.it  This test is no t yet approved or cleared by the [place] FDA and  has been authorized for detection and/or diagnosis of SARS-CoV-2 by FDA under an Emergency Use Authorization (EUA). This EUA will remain  in effect (meaning this test can be used) for the duration of the COVID-19 declaration under Section 564(b)(1) of the Act,  21 U.S.C.section 360bbb-3(b)(1), unless the authorization is terminated  or revoked sooner.       Influenza A by PCR NEGATIVE NEGATIVE Final   Influenza B by PCR NEGATIVE NEGATIVE Final    Comment: (NOTE) The Xpert Xpress SARS-CoV-2/FLU/RSV plus assay is intended as an aid in the diagnosis of influenza from Nasopharyngeal swab specimens and should not be used as a sole basis for treatment. Nasal washings and aspirates are unacceptable for Xpert Xpress SARS-CoV-2/FLU/RSV testing.  Fact Sheet for Patients: BloggerCourse.com  Fact Sheet for Healthcare Providers: SeriousBroker.it  This test is not yet approved or cleared by the [place] FDA and has been authorized for detection and/or diagnosis of SARS-CoV-2 by FDA under an Emergency Use Authorization (EUA). This EUA will remain in effect (meaning this test can be used) for the duration of the COVID-19 declaration under Section 564(b)(1) of the Act, 21 U.S.C. section 360bbb-3(b)(1), unless the authorization is terminated or revoked.     Resp Syncytial Virus by PCR NEGATIVE NEGATIVE Final    Comment: (NOTE) Fact Sheet  for Patients: BloggerCourse.com  Fact Sheet for Healthcare Providers: SeriousBroker.it  This test is not yet approved or cleared by the [place] FDA and has been authorized for detection and/or diagnosis of SARS-CoV-2 by FDA under an Emergency Use Authorization (EUA). This EUA will remain in effect (meaning this test can be used) for the duration of the COVID-19 declaration under Section 564(b)(1) of the Act, 21 U.S.C. section 360bbb-3(b)(1), unless the authorization is terminated or revoked.  Performed at [facility], [address] [place], [place] [address]          Radiology Studies: CT ABDOMEN PELVIS WO CONTRAST Result Date: 07/28/2023 CLINICAL DATA:  Sepsis diarrhea vomiting  EXAM: CT ABDOMEN AND PELVIS WITHOUT CONTRAST TECHNIQUE: Multidetector CT imaging of the abdomen and pelvis was performed following the standard protocol without IV contrast. RADIATION DOSE REDUCTION: This exam was performed according to the departmental dose-optimization program which includes automated exposure control, adjustment of the mA and/or kV according to patient size and/or use of iterative reconstruction technique. COMPARISON:  CT 09/05/2022 FINDINGS: Lower chest: Lung bases demonstrate no acute airspace disease. Coronary vascular calcification. Hepatobiliary: Gallstones. No focal hepatic abnormality. No biliary dilatation Pancreas: Unremarkable. No pancreatic ductal dilatation or surrounding inflammatory changes. Spleen: Normal in size without focal abnormality. Adrenals/Urinary Tract: Adrenal glands are normal. Kidneys show no hydronephrosis. Small nonobstructing left kidney stone. 2.6 cm slightly dense right midpole renal lesion. Bladder partially obscured by artifact. Stable nonspecific perinephric fat stranding. Stomach/Bowel: Stomach nonenlarged. No dilated small bowel. Negative appendix. Diverticular disease of the left colon. No acute bowel wall thickening Vascular/Lymphatic: Advanced aortic atherosclerosis. No aneurysm. No suspicious lymph nodes. Reproductive: Obscured by artifact Other: Negative for pelvic effusion or free air. Small fat containing umbilical hernia Musculoskeletal: Posterior spinal fusion hardware fell 2 through L4. Right-sided fixing screw at L4 projects lateral to the right vertebral cortex. IMPRESSION: 1. No CT evidence for acute intra-abdominal or pelvic abnormality. 2. Diverticular disease of the left colon without acute inflammatory process. 3. Gallstones. 4. Nonobstructing left kidney stone. 5. 2.6 cm slightly dense right midpole renal lesion, indeterminate without contrast on this exam. When the patient is clinically stable and able to follow directions and hold their  breath (preferably as an outpatient) further evaluation with dedicated abdominal MRI should be considered. 6. Aortic atherosclerosis. Aortic Atherosclerosis (ICD10-I70.0). Electronically Signed   By: Jasmine Pang M.D.   On: 07/28/2023 22:12        Scheduled Meds:  heparin  5,000 Units Subcutaneous Q12H   pantoprazole (PROTONIX) IV  40 mg Intravenous Q12H   sodium chloride flush  3 mL Intravenous Q12H   thiamine (VITAMIN B1) injection  100 mg Intravenous Q24H   Continuous Infusions:  lactated ringers 75 mL/hr at 07/29/23 0452     LOS: 0 days       Charise Killian, MD [facility] Pager [phone]  If 7PM-7AM, please contact night-coverage [email or web address]  07/29/2023, 8:56 AM

## 2023-07-29 NOTE — ED Notes (Signed)
Patient assisted to stand and bedside to use the urinal.

## 2023-07-29 NOTE — Consult Note (Signed)
Cardiology Consultation:   Patient ID: Daniel Kline; [id number]; 1946/05/25   Admit date: 07/28/2023 Date of Consult: 07/29/2023  Primary Care Provider: [name], MD Primary Cardiologist: Mariah Milling Primary Electrophysiologist:  None   Patient Profile:   Daniel Kline is a 77 y.o. male with a hx of atrial flutter status post ablation in 04/2017 at outside hospital, diastolic dysfunction, cutaneous T-cell lymphoma, DM2, CKD stage IIIb, HTN, HLD, chronic low back pain, and OSA on CPAP who is being seen today for the evaluation of question of Afib at the request of Dr. Allena Katz.  History of Present Illness:   [name] was incidentally found to be in atrial flutter when he presented for prior knee replacement at outside hospital. Echo at that time demonstrated an EF of 60 to 65% with left ventricular septal hypertrophy, normal wall motion, mildly dilated left atrium, normal RV systolic function and ventricular cavity size, and a PASP of 41 mmHg. He subsequently underwent successful ablation on 04/28/2017. He has not been maintained on anticoagulation.  During prior admissions, there has been concern for A-fib on EKG, however upon cardiology review these demonstrated a sinus rhythm with PACs and baseline wandering/artifact.  In this setting, apixaban was discontinued.  Echo in 03/2022 demonstrated an EF of 60 to 65%, no regional wall motion abnormalities, grade 1 diastolic dysfunction, normal RV systolic function, no significant valvular abnormalities, and normal size/structure aorta.  Patient was admitted on 07/28/2023 with a 1 week history of gastroenteritis with nausea, vomiting, and diarrhea.  Symptoms have since resolved and patient feels back to baseline.  EKG demonstrated sinus rhythm with frequent PACs with baseline artifact.  Cardiology consulted for further evaluation of EKG.  At time of cardiology consult, patient has finished nearly all of his breakfast and feels back to  baseline.  He is without symptoms of angina or cardiac decompensation.  No palpitations, dizziness, presyncope, or syncope.  No hematochezia or melena.  Labs: Potassium 3.4 trending to 3.1, magnesium 2.7, BUN 66 trending to 63, serum creatinine at 3.41 trending to 3.8 albumin 3.2 trending to 2.7, Hgb 12.3 trending to 10.4, PLT 437 trending to 433    Past Medical History:  Diagnosis Date   Anemia    H/O   Anxiety    Arthritis    Atrial flutter (HCC)    a. s/p post ablation in 04/2017   Chronic kidney disease    Complication of anesthesia    CTCL (cutaneous T-cell lymphoma) (HCC)    Diabetes mellitus without complication (HCC)    Diastolic dysfunction    a. 03/2022 Echo: EF 60-65%, no rwma, GrI DD, nl RV fxn.   Dysplastic nevus 12/19/2017   Right distal lat. forearm near wrist. Severe atypia, close to peripheral margin.   Dysplastic nevus 06/21/2018   Upper back right paraspinal. Severe atypia, peripheral margin involved. Excised 07/11/2018, margins free.   Family history of adverse reaction to anesthesia    PT WAS ADOPTED   HLD (hyperlipidemia)    HTN (hypertension)    Hx of dysplastic nevus 2019   multiple sites   Hx of squamous cell carcinoma 01/18/2018   R mid lateral forearm   MRSA (methicillin resistant Staphylococcus aureus)    after back surgery   OSA (obstructive sleep apnea)    USES BIPAP   Polio    POLIOMYELITIS 01/12/2010   Right arm affected   PONV (postoperative nausea and vomiting)    Squamous cell carcinoma of skin 12/19/2017   Right  mid lat. forearm. SCCis, hypertrophic.    Past Surgical History:  Procedure Laterality Date   BACK SURGERY     LUMBAR   CARDIAC ELECTROPHYSIOLOGY STUDY AND ABLATION  2019   CATARACT EXTRACTION W/PHACO Right 05/05/2022   Procedure: CATARACT EXTRACTION PHACO AND INTRAOCULAR LENS PLACEMENT (IOC) RIGHT;  Surgeon: Lockie Mola, MD;  Location[facility];  Service: Ophthalmology;  Laterality: Right;   Diabetic 10.42 01:13.4   COLONOSCOPY WITH PROPOFOL N/A 04/04/2019   Procedure: COLONOSCOPY WITH PROPOFOL;  Surgeon: Toledo, Boykin Nearing, MD;  Location: [facility];  Service: Gastroenterology;  Laterality: N/A;   I & D EXTREMITY Right 02/01/2020   Procedure: IRRIGATION AND DEBRIDEMENT EXTREMITY with poly exchange;  Surgeon: Donato Heinz, MD;  Location: [facility];  Service: Orthopedics;  Laterality: Right;   INCISION AND DRAINAGE     BACK-MRSA INFECTION AFTER BACK SURGERY   KNEE ARTHROPLASTY Right 01/28/2020   Procedure: COMPUTER ASSISTED TOTAL KNEE ARTHROPLASTY;  Surgeon: Donato Heinz, MD;  Location: [facility];  Service: Orthopedics;  Laterality: Right;   MOUTH SURGERY     right elbow surgery     right knee surgery     right shoulder surgery     from polio damage   TONSILLECTOMY     TOTAL HIP ARTHROPLASTY Bilateral 04/2016     Home Meds: Prior to Admission medications   Medication Sig Start Date End Date Taking? Authorizing Provider  amLODipine (NORVASC) 5 MG tablet TAKE 1 TABLET BY MOUTH DAILY 06/16/22  Yes [name], MD  bismuth subsalicylate (PEPTO BISMOL) 262 MG/15ML suspension Take 30 mLs by mouth every 6 (six) hours as needed for indigestion or diarrhea or loose stools.   Yes [provider]  calcitRIOL (ROCALTROL) 0.25 MCG capsule Take 0.25 mcg by mouth daily. 06/15/23 06/14/24 Yes [provider]  calcium carbonate (OSCAL) 1500 (600 Ca) MG TABS tablet Take 600 mg of elemental calcium by mouth daily with breakfast.   Yes [provider]  carvedilol (COREG) 3.125 MG tablet TAKE 1 TABLET BY MOUTH 2 TIMES DAILY WITH A MEAL. 04/21/23  Yes [name], MD  Cholecalciferol (VITAMIN D-3) 25 MCG (1000 UT) CAPS Take 1,000 Units by mouth daily.   Yes [provider]  clorazepate (TRANXENE) 7.5 MG tablet TAKE 1 TABLET BY MOUTH TWICE A DAY AS NEEDED FOR ANXIETY 06/15/23  Yes [name], MD  cyanocobalamin (VITAMIN B12) 500 MCG tablet Take  1,000 mcg by mouth daily.   Yes [provider]  EPINEPHrine 0.3 mg/0.3 mL IJ SOAJ injection Inject 0.3 mg into the muscle as needed for anaphylaxis. 01/27/23  Yes [name], MD  Ferrous Sulfate (IRON PO) Take 1 tablet by mouth at bedtime.   Yes [provider]  folic acid (FOLVITE) 1 MG tablet Take 1 mg by mouth daily. Every day except day that he takes methotrexate 06/11/20  Yes [provider]  gabapentin (NEURONTIN) 300 MG capsule TAKE 1 CAPSULE BY MOUTH 3 TIMES  DAILY 01/31/23  Yes [name], MD  HYDROcodone-acetaminophen (NORCO/VICODIN) 5-325 MG tablet TAKE 1 TABLET BY MOUTH EVERY 6 HOURS AS NEEDED FOR PAIN 07/18/23  Yes Tillman Abide I, MD  hydrocortisone 2.5 % cream Apply topically 3 (three) times daily as needed. 06/17/23  Yes [name], MD  hydrOXYzine (ATARAX) 25 MG tablet TAKE ONE TABLET EVERY EIGHT HOURS AS NEEDED 03/16/23  Yes [name], MD  isosorbide mononitrate (IMDUR) 60 MG 24 hr tablet Take 1  tablet (60 mg total) by mouth daily before lunch. 05/11/21  Yes Gollan, Tollie Pizza, MD  ketoconazole (NIZORAL) 2 % cream Apply 1 Application topically at bedtime. 06/30/23 07/30/23 Yes Deirdre Evener, MD  latanoprost (XALATAN) 0.005 % ophthalmic solution Place 1 drop into both eyes at bedtime.   Yes [provider]  losartan (COZAAR) 100 MG tablet TAKE 1 TABLET BY MOUTH DAILY 04/21/23  Yes Gollan, Tollie Pizza, MD  MAGNESIUM PO Take 1 tablet by mouth daily.   Yes [provider]  methotrexate (RHEUMATREX) 2.5 MG tablet Take 5 mg by mouth once a week. 07/12/23  Yes [provider]  rosuvastatin (CRESTOR) 10 MG tablet Take 1 tablet (10 mg total) by mouth daily. 04/07/23  Yes [name], MD  Semaglutide,0.25 or 0.5MG /DOS, (OZEMPIC, 0.25 OR 0.5 MG/DOSE,) 2 MG/3ML SOPN Inject 0.25 mg into the skin once a week. 07/01/23  Yes [name], MD  spironolactone (ALDACTONE) 25 MG tablet Take 25 mg by mouth daily.    Yes [provider]  timolol (TIMOPTIC) 0.5 % ophthalmic solution Place 1 drop into both eyes 2 (two) times daily. 01/11/20  Yes [provider]  amoxicillin-clavulanate (AUGMENTIN) 875-125 MG tablet Take 1 tablet by mouth 2 (two) times daily. Patient not taking: Reported on 07/25/2023 06/17/23   [name], MD  furosemide (LASIX) 40 MG tablet Take 1 tablet (40 mg total) by mouth daily. Patient not taking: Reported on 07/29/2023 11/25/22   Creig Hines, NP  glucose blood (ONETOUCH ULTRA) test strip 1 each by Other route daily. Use to check blood sugar once daily 10/19/22   [name], MD    Inpatient Medications: Scheduled Meds:  heparin  5,000 Units Subcutaneous Q12H   pantoprazole (PROTONIX) IV  40 mg Intravenous Q12H   sodium chloride flush  3 mL Intravenous Q12H   thiamine (VITAMIN B1) injection  100 mg Intravenous Q24H   Continuous Infusions:  lactated ringers 75 mL/hr at 07/29/23 0452   PRN Meds: acetaminophen **OR** acetaminophen, insulin aspart  Allergies:   Allergies  Allergen Reactions   Bee Venom Anaphylaxis   Oxycodone Other (See Comments)    Delusions   Hydromorphone Other (See Comments)    hallucinating   Hydroxychloroquine     Other reaction(s): Other (See Comments) He broke out really badly.   Zolpidem Other (See Comments)    Social History:   Social History   Socioeconomic History   Marital status: Married    Spouse name: Not on file   Number of children: 0   Years of education: Not on file   Highest education level: Not on file  Occupational History   Occupation: Corporate investment banker    Comment: 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   Occupation: Engineering geologist    Comment: Retired   Occupation: Scientist, research (medical)    Comment: Retired  Tobacco Use   Smoking status: Former    Types: Cigars    Quit date: 09/23/1990    Years since quitting: 32.8    Passive exposure: Past (as a child)   Smokeless  tobacco: Former    Quit date: 02/25/2006  Vaping Use   Vaping status: Never Used  Substance and Sexual Activity   Alcohol use: No    Alcohol/week: 0.0 standard drinks of alcohol   Drug use: No   Sexual activity: Yes    Partners: Female  Other Topics Concern   Not on file  Social History Narrative   Has living will  Wife is health care POA---then brother or sister   Would accept resuscitation attempts but no prolonged ventilation or tube feeds   Social Drivers of Health   Financial Resource Strain: Low Risk  (07/20/2017)   Overall Financial Resource Strain (CARDIA)    Difficulty of Paying Living Expenses: Not hard at all  Food Insecurity: No Food Insecurity (10/21/2022)   Hunger Vital Sign    Worried About Running Out of Food in the Last Year: Never true    Ran Out of Food in the Last Year: Never true  Transportation Needs: No Transportation Needs (10/21/2022)   PRAPARE - Administrator, Civil Service (Medical): No    Lack of Transportation (Non-Medical): No  Physical Activity: Not on file  Stress: Not on file  Social Connections: Unknown (07/20/2017)   Social Connection and Isolation Panel [NHANES]    Frequency of Communication with Friends and Family: Patient declined    Frequency of Social Gatherings with Friends and Family: Patient declined    Attends Religious Services: Patient declined    Database administrator or Organizations: Patient declined    Attends Banker Meetings: Patient declined    Marital Status: Patient declined  Intimate Partner Violence: Not At Risk (09/05/2022)   Humiliation, Afraid, Rape, and Kick questionnaire    Fear of Current or Ex-Partner: No    Emotionally Abused: No    Physically Abused: No    Sexually Abused: No     Family History:   Family History  Adopted: Yes    ROS:  Review of Systems  Constitutional:  Positive for malaise/fatigue. Negative for chills, diaphoresis, fever and weight loss.  HENT:  Negative  for congestion.   Eyes:  Negative for discharge and redness.  Respiratory:  Negative for cough, hemoptysis, sputum production, shortness of breath and wheezing.   Cardiovascular:  Negative for chest pain, palpitations, orthopnea, claudication, leg swelling and PND.  Gastrointestinal:  Positive for abdominal pain, diarrhea, nausea and vomiting. Negative for blood in stool, constipation, heartburn and melena.  Genitourinary:  Negative for hematuria.  Musculoskeletal:  Negative for falls and myalgias.  Skin:  Negative for rash.  Neurological:  Positive for weakness. Negative for dizziness, tingling, tremors, sensory change, speech change, focal weakness and loss of consciousness.  Endo/Heme/Allergies:  Does not bruise/bleed easily.  Psychiatric/Behavioral:  Negative for substance abuse. The patient is not nervous/anxious.   All other systems reviewed and are negative.     Physical Exam/Data:   Vitals:   07/29/23 0330 07/29/23 0530 07/29/23 0900 07/29/23 0932  BP: (!) 141/85 134/83 (!) 157/95   Pulse: 87 (!) 50 (!) 105   Resp: 15 14 14    Temp:  98.1 F (36.7 C)  98.1 F (36.7 C)  TempSrc:  Oral  Oral  SpO2: 100% 100% 100%   Weight:      Height:        Intake/Output Summary (Last 24 hours) at 07/29/2023 0940 Last data filed at 07/29/2023 0344 Gross per 24 hour  Intake 2000 ml  Output 225 ml  Net 1775 ml   Filed Weights   07/28/23 1330  Weight: 108.9 kg   Body mass index is 36.49 kg/m.   Physical Exam: General: Well developed, well nourished, in no acute distress. Head: Normocephalic, atraumatic, sclera non-icteric, no xanthomas, nares without discharge.  Neck: Negative for carotid bruits. JVD not elevated. Lungs: Clear bilaterally to auscultation without wheezes, rales, or rhonchi. Breathing is unlabored. Heart: RRR with S1 S2.  No murmurs, rubs, or gallops appreciated. Abdomen: Soft, non-tender, non-distended with normoactive bowel sounds. No hepatomegaly. No  rebound/guarding. No obvious abdominal masses. Msk:  Strength and tone appear normal for age. Extremities: No clubbing or cyanosis. No edema. Distal pedal pulses are 2+ and equal bilaterally. Neuro: Alert and oriented X 3. No facial asymmetry. No focal deficit. Moves all extremities spontaneously. Psych:  Responds to questions appropriately with a normal affect.   EKG:  The EKG was personally reviewed and demonstrates: 12/19 - NSR, 77 bpm, frequent PACs, baseline artifact, nonspecific ST-T changes.  12/20 - NSR, 89 bpm, frequent PACs, nonspecific ST-T changes, baseline artifact Telemetry:  Telemetry was personally reviewed and demonstrates: Sinus rhythm with frequent PACs versus wandering atrial pacemaker  Weights: Filed Weights   07/28/23 1330  Weight: 108.9 kg    Relevant CV Studies:  2D echo 03/17/2022: 1. Left ventricular ejection fraction, by estimation, is 60 to 65%. The  left ventricle has normal function. The left ventricle has no regional  wall motion abnormalities. Left ventricular diastolic parameters are  consistent with Grade I diastolic  dysfunction (impaired relaxation).   2. Right ventricular systolic function is normal. The right ventricular  size is not well visualized.   3. The mitral valve is grossly normal. No evidence of mitral valve  regurgitation.   4. The aortic valve was not well visualized. Aortic valve regurgitation  is not visualized. __________   2D echo 04/20/2017: SUMMARY  The left ventricular size is normal.  Basal left ventricular septal hypertrophy  LV ejection fraction = 60-65%.   Left ventricular systolic function is normal.  No segmental wall motion abnormalities seen in the left ventricle  The right ventricle is normal in size and function.  The left atrium is mildly dilated.  There is no significant valvular stenosis or regurgitation  Mild pulmonary hypertension.  Estimated right ventricular systolic pressure is 41 mmHg.  There is no  pericardial effusion.  There is no comparison study available.    Laboratory Data:  Chemistry Recent Labs  Lab 07/28/23 1333 07/29/23 0332  NA 134* 136  K 3.4* 3.1*  CL 104 108  CO2 21* 21*  GLUCOSE 162* 141*  BUN 66* 63*  CREATININE 3.41* 3.08*  CALCIUM 8.7* 7.9*  GFRNONAA 18* 20*  ANIONGAP 9 7    Recent Labs  Lab 07/28/23 1333 07/29/23 0332  PROT 6.2* 5.0*  ALBUMIN 3.2* 2.7*  AST 16 19  ALT 28 26  ALKPHOS 58 48  BILITOT 0.4 0.5   Hematology Recent Labs  Lab 07/28/23 1333 07/29/23 0332  WBC 5.0 5.7  RBC 4.04* 3.34*  HGB 12.3* 10.4*  HCT 36.6* 30.7*  MCV 90.6 91.9  MCH 30.4 31.1  MCHC 33.6 33.9  RDW 14.5 14.8  PLT 437* 433*   Cardiac EnzymesNo results for input(s): "TROPONINI" in the last 168 hours. No results for input(s): "TROPIPOC" in the last 168 hours.  BNPNo results for input(s): "BNP", "PROBNP" in the last 168 hours.  DDimer No results for input(s): "DDIMER" in the last 168 hours.  Radiology/Studies:  CT ABDOMEN PELVIS WO CONTRAST Result Date: 07/28/2023 IMPRESSION: 1. No CT evidence for acute intra-abdominal or pelvic abnormality. 2. Diverticular disease of the left colon without acute inflammatory process. 3. Gallstones. 4. Nonobstructing left kidney stone. 5. 2.6 cm slightly dense right midpole renal lesion, indeterminate without contrast on this exam. When the patient is clinically stable and able to follow directions and hold their breath (preferably as an outpatient) further evaluation  with dedicated abdominal MRI should be considered. 6. Aortic atherosclerosis. Aortic Atherosclerosis (ICD10-I70.0). Electronically Signed   By: Jasmine Pang M.D.   On: 07/28/2023 22:12    Assessment and Plan:   1.  Sinus rhythm with PACs: -No evidence of A-fib on EKG or telemetry -Suspect his atrial ectopy is exacerbated by electrolyte abnormality with vomiting and diarrhea -Fortunately, he is largely asymptomatic from this -Continue low-dose carvedilol,  bradycardia precludes titration -Replete potassium to goal 4.0 -Magnesium at goal -TSH normal -From a cardiac perspective, patient may be discharged when potassium is repleted  2.  Hypokalemia: -Likely in the setting of GI loss -Has received IV and oral repletion this morning -Will obtain a repeat potassium at this afternoon -Consider resumption of PTA spironolactone -Replete to goal 4.0  3.  History of typical atrial flutter: -Status post ablation in 04/2017 -No evidence of recurrence -No longer on anticoagulation  4.  HTN: -Blood pressure well-controlled currently -PTA carvedilol, Imdur, losartan, and spironolactone  5.  HLD: -PTA rosuvastatin  6.  OSA: -CPAP  7.  Gastroenteritis: -Resolved     For questions or updates, please contact [facility] Please consult [email or web address] for contact info under Cardiology/STEMI.   Signed, Eula Listen, PA-C [facility] Pager: [phone] 07/29/2023, 9:40 AM

## 2023-07-29 NOTE — Assessment & Plan Note (Addendum)
Mild anemia with a hemoglobin of 12.3 platelet count 437 and normal white count.  Currently stable will follow.  Patient is on iron and folic acid given vitamin B12 supplementation as well.

## 2023-07-29 NOTE — Assessment & Plan Note (Addendum)
Currently irregular and EKG repeat EKG is pending as tracing is poor.  Will continue patient on Coreg 3.125 twice daily. On repeat EKG it looks like pt is in a.fib rvr if pt is tachycardic we will start diltiazem drip and upgrade.  Per last cardiology note in July 2024 states that patient had atrial flutter status post ablation in 2019 in [facility]. Irregularly on EKG will request cardiology to see him while in house.  Tonight we will anticoagulate with heparin tid for DVT prophylaxis.

## 2023-07-30 DIAGNOSIS — N179 Acute kidney failure, unspecified: Secondary | ICD-10-CM | POA: Diagnosis not present

## 2023-07-30 LAB — PHOSPHORUS: Phosphorus: 2.1 mg/dL — ABNORMAL LOW (ref 2.5–4.6)

## 2023-07-30 LAB — CBC
HCT: 30.1 % — ABNORMAL LOW (ref 39.0–52.0)
Hemoglobin: 10.4 g/dL — ABNORMAL LOW (ref 13.0–17.0)
MCH: 31 pg (ref 26.0–34.0)
MCHC: 34.6 g/dL (ref 30.0–36.0)
MCV: 89.9 fL (ref 80.0–100.0)
Platelets: 491 10*3/uL — ABNORMAL HIGH (ref 150–400)
RBC: 3.35 MIL/uL — ABNORMAL LOW (ref 4.22–5.81)
RDW: 14.8 % (ref 11.5–15.5)
WBC: 6.5 10*3/uL (ref 4.0–10.5)
nRBC: 0 % (ref 0.0–0.2)

## 2023-07-30 LAB — GLUCOSE, CAPILLARY
Glucose-Capillary: 95 mg/dL (ref 70–99)
Glucose-Capillary: 117 mg/dL — ABNORMAL HIGH (ref 70–99)
Glucose-Capillary: 150 mg/dL — ABNORMAL HIGH (ref 70–99)
Glucose-Capillary: 142 mg/dL — ABNORMAL HIGH (ref 70–99)
Glucose-Capillary: 154 mg/dL — ABNORMAL HIGH (ref 70–99)

## 2023-07-30 LAB — BASIC METABOLIC PANEL
Anion gap: 5 (ref 5–15)
BUN: 46 mg/dL — ABNORMAL HIGH (ref 8–23)
CO2: 21 mmol/L — ABNORMAL LOW (ref 22–32)
Calcium: 8.1 mg/dL — ABNORMAL LOW (ref 8.9–10.3)
Chloride: 110 mmol/L (ref 98–111)
Creatinine, Ser: 2.66 mg/dL — ABNORMAL HIGH (ref 0.61–1.24)
GFR, Estimated: 24 mL/min — ABNORMAL LOW (ref >60)
Glucose, Bld: 119 mg/dL — ABNORMAL HIGH (ref 70–99)
Potassium: 3.1 mmol/L — ABNORMAL LOW (ref 3.5–5.1)
Sodium: 136 mmol/L (ref 135–145)

## 2023-07-30 MED ORDER — POTASSIUM CHLORIDE CRYS ER 20 MEQ PO TBCR: 40.0000 meq | EXTENDED_RELEASE_TABLET | Freq: Once | ORAL | Status: DC

## 2023-07-30 MED ORDER — SODIUM CHLORIDE 0.9 % IV SOLN: INTRAVENOUS | Status: DC

## 2023-07-30 MED ORDER — POTASSIUM CHLORIDE CRYS ER 20 MEQ PO TBCR
40.0000 meq | EXTENDED_RELEASE_TABLET | Freq: Two times a day (BID) | ORAL | Status: AC
  Administered 2023-07-30 (×2): 40 meq via ORAL
  Filled 2023-07-30 (×2): qty 2

## 2023-07-30 NOTE — Evaluation (Signed)
Physical Therapy Evaluation Patient Details Name: Daniel Kline MRN: [id number] DOB: [birth year] Today's Date: 07/30/2023  History of Present Illness  Patient is 77 year old man with history of atrial flutter status post ablation 2018, chronic HFpEF, hypertension, hyperlipidemia, type 2 diabetes mellitus, chronic kidney disease stage IIIb, cutaneous T-cell lymphoma, and obstructive sleep apnea, whom we have been asked to see due to possible atrial fibrillation.  He presented with several days of GI upset including nausea, vomiting, diarrhea.  The symptoms have resolved.  There was concern for atrial fibrillation on EKG and telemetry.  However, on our review, there is no evidence of atrial fibrillation.  Only sinus rhythm with frequent PACs versus wandering atrial pacemaker are noted.  Clinical Impression  Pt received in bed agreeable to PT evaluation. PLOF Ind and driving, living with wife available 24/7. PT assessment revealed generalized weakness causing need to use AD with functional mobility, bed mobility with Min to mod assist and transfers with MIN fro STS form EOB due soft surface, and with Sup from recliner. Pt ambulated 40 ft with CGA of 1. Pt demonstrates generalized deconditioning due to current condition. PT will continue in Acute and is referred to mobility specialists. Pt will benefit from low intensity PT after acute care to return to PLOF. .       If plan is discharge home, recommend the following: A little help with walking and/or transfers;A little help with bathing/dressing/bathroom;Assistance with cooking/housework;Assist for transportation;Help with stairs or ramp for entrance   Can travel by private vehicle        Equipment Recommendations None recommended by PT  Recommendations for Other Services       Functional Status Assessment Patient has had a recent decline in their functional status and demonstrates the ability to make significant improvements in function in a  reasonable and predictable amount of time.     Precautions / Restrictions Precautions Precautions: Fall Restrictions Weight Bearing Restrictions Per Provider Order: No      Mobility  Bed Mobility Overal bed mobility: Needs Assistance Bed Mobility: Supine to Sit     Supine to sit: Mod assist          Transfers Overall transfer level: Needs assistance Equipment used: Rolling walker (2 wheels) Transfers: Sit to/from Stand, Bed to chair/wheelchair/BSC Sit to Stand: Min assist, Supervision   Step pivot transfers: Contact guard assist       General transfer comment: min assist from bed and sup form recliner.    Ambulation/Gait Ambulation/Gait assistance: Contact guard assist Gait Distance (Feet): 40 Feet Assistive device: Rolling walker (2 wheels) Gait Pattern/deviations: Step-through pattern Gait velocity: dec     General Gait Details: decreased strike and slow  Stairs            Wheelchair Mobility     Tilt Bed    Modified Rankin (Stroke Patients Only)       Balance Overall balance assessment: Needs assistance Sitting-balance support: Feet supported Sitting balance-Leahy Scale: Good     Standing balance support: Bilateral upper extremity supported Standing balance-Leahy Scale: Good Standing balance comment: no LOB noted.                             Pertinent Vitals/Pain Pain Assessment Pain Assessment: No/denies pain    Home Living Family/patient expects to be discharged to:: Private residence Living Arrangements: Spouse/significant other Available Help at Discharge: Family;Available 24 hours/day Type of Home: House Home Access: Stairs to enter  Entrance Stairs-Rails: Can reach both Entrance Stairs-Number of Steps: 4   Home Layout: One level        Prior Function Prior Level of Function : Driving;Independent/Modified Independent             Mobility Comments: Occasional use of SPC, drives ADLs Comments: IND w/  ADL, IADL     Extremity/Trunk Assessment   Upper Extremity Assessment Upper Extremity Assessment: Overall WFL for tasks assessed    Lower Extremity Assessment Lower Extremity Assessment: Generalized weakness       Communication   Communication Communication: No apparent difficulties  Cognition Arousal: Alert Behavior During Therapy: WFL for tasks assessed/performed Overall Cognitive Status: Within Functional Limits for tasks assessed                                          General Comments      Exercises     Assessment/Plan    PT Assessment Patient needs continued PT services  PT Problem List Decreased strength;Decreased balance;Decreased activity tolerance;Decreased coordination;Obesity       PT Treatment Interventions Gait training;Stair training;Therapeutic activities;Therapeutic exercise;Balance training;Neuromuscular re-education;Patient/family education    PT Goals (Current goals can be found in the Care Plan section)  Acute Rehab PT Goals Patient Stated Goal: " Go home and get stronger." PT Goal Formulation: With patient Time For Goal Achievement: 08/13/23 Potential to Achieve Goals: Good    Frequency Min 1X/week     Co-evaluation               AM-PAC PT "6 Clicks" Mobility  Outcome Measure Help needed turning from your back to your side while in a flat bed without using bedrails?: A Little Help needed moving from lying on your back to sitting on the side of a flat bed without using bedrails?: A Little Help needed moving to and from a bed to a chair (including a wheelchair)?: A Little Help needed standing up from a chair using your arms (e.g., wheelchair or bedside chair)?: A Little Help needed to walk in hospital room?: A Little Help needed climbing 3-5 steps with a railing? : A Lot 6 Click Score: 17    End of Session Equipment Utilized During Treatment: Gait belt Activity Tolerance: Patient tolerated treatment  well Patient left: in chair;with call bell/phone within reach;with chair alarm set Nurse Communication: Mobility status PT Visit Diagnosis: Muscle weakness (generalized) (M62.81);Difficulty in walking, not elsewhere classified (R26.2)    Time: 1300-1330 PT Time Calculation (min) (ACUTE ONLY): 30 min   Charges:   PT Evaluation $PT Eval Low Complexity: 1 Low PT Treatments $Gait Training: 8-22 mins PT General Charges $$ ACUTE PT VISIT: 1 Visit        Janet Berlin PT DPT 2:13 PM,07/30/23

## 2023-07-30 NOTE — Plan of Care (Signed)

## 2023-07-30 NOTE — Progress Notes (Addendum)
PHARMACY CONSULT NOTE - FOLLOW UP  Pharmacy Consult for Electrolyte Monitoring and Replacement   Recent Labs: Potassium (mmol/L)  Date Value  07/30/2023 3.1 (L)  10/28/2011 3.9   Magnesium (mg/dL)  Date Value  32/44/0102 2.7 (H)   Calcium (mg/dL)  Date Value  72/53/6644 8.1 (L)   Calcium, Total (mg/dL)  Date Value  03/47/4259 7.9 (L)   Albumin (g/dL)  Date Value  56/38/7564 2.7 (L)   Phosphorus (mg/dL)  Date Value  33/29/5188 2.1 (L)   Sodium (mmol/L)  Date Value  07/30/2023 136  10/28/2011 141     Assessment: 77 y.o. male with medical history significant for atrial flutter status post catheter ablation in September 2018, chronic HFpEF, hypertension, hyperlipidemia, diabetes, stage III chronic kidney disease, cutaneous T-cell lymphoma, chronic low back pain, and sleep apnea on CPAP, complaints of diarrhea since Friday and is not able to keep food down.   Goal of Therapy:  K+ > 4. Mg > 2  Plan:  Kcl 40 mEq x 2 F/u with AM labs.   Ronnald Ramp ,PharmD Clinical Pharmacist 07/30/2023 7:28 AM

## 2023-07-30 NOTE — Progress Notes (Signed)
PROGRESS NOTE    GIOVANNI CHRZANOWSKI  NWG:[id number] DOB: 31-Jul-1946 DOA: 07/28/2023 PCP: [name], MD   Assessment & Plan:   Principal Problem:   Diarrhea Active Problems:   Paroxysmal atrial fibrillation (HCC)   Acute kidney injury superimposed on chronic kidney disease (HCC)   Essential hypertension   Type 2 diabetes mellitus with chronic kidney disease, without long-term current use of insulin (HCC)   Asymptomatic gallstones   Anemia   Electrolyte abnormality   Personal history of atrial flutter   AKI (acute kidney injury) (HCC)   PAC (premature atrial contraction)  Assessment and Plan: AKI on CKDIII: baseline Cr is unknown possibly IIIa vs IIIb. Cr is trending down from day prior. Continue on IVFs.   Hx A. Flutter: typical as per cardio. No evidence of reoccurrence as per cardio. Cardio recs apprec & signed off   Diarrhea: no diarrhea again today. D/c GI PCR panel & c. diff. Resolved   DM2: HbA1c 9.0, poorly controlled. Continue on SSI w/ accuchecks   Hypokalemia: KCl given  Normocytic anemia: H&H are stable.   Renal lesion: incidental finding on CT which recommends for MRI but pt says he has metal in his body so will likely need repeat CT as an outpatient to f/u. Pt verbalized his understanding   Thrombocytosis: etiology unclear. Will continue to monitor   Obesity: BMI 36.4. would benefit from weight loss   DVT prophylaxis: heparin SQ Code Status: full  Family Communication:  Disposition Plan: likely d/c back home   Level of care: Telemetry Medical  Status is: Inpatient Remains inpatient appropriate because: severity of illness    Consultants:  Cardio   Procedures:   Antimicrobials:   Subjective: Pt c/o fatigue  Objective: Vitals:   07/30/23 0312 07/30/23 0338 07/30/23 0348 07/30/23 0826  BP: (!) 170/98   (!) 168/86  Pulse: 71   (!) 53  Resp: 18   16  Temp: 99.1 F (37.3 C)   98.2 F (36.8 C)  TempSrc: Oral   Oral  SpO2: (!) 87%   98% 100%  Weight:  104.2 kg    Height:        Intake/Output Summary (Last 24 hours) at 07/30/2023 0840 Last data filed at 07/30/2023 0143 Gross per 24 hour  Intake 1502.5 ml  Output 600 ml  Net 902.5 ml   Filed Weights   07/28/23 1330 07/30/23 0338  Weight: 108.9 kg 104.2 kg    Examination:  General exam: appears comfortable  Respiratory system: clear breath sounds b/l  Cardiovascular system: S1/S2+ Gastrointestinal system: Abd is soft, NT, obese & normal bowel sounds  Central nervous system: alert & oriented. Moves all extremities  Psychiatry: Judgement and insight appears poor. Appropriate mood and affect     Data Reviewed: I have personally reviewed following labs and imaging studies  CBC: Recent Labs  Lab 07/28/23 1333 07/29/23 0332 07/30/23 0447  WBC 5.0 5.7 6.5  HGB 12.3* 10.4* 10.4*  HCT 36.6* 30.7* 30.1*  MCV 90.6 91.9 89.9  PLT 437* 433* 491*   Basic Metabolic Panel: Recent Labs  Lab 07/28/23 1333 07/29/23 0332 07/29/23 1921 07/30/23 0447  NA 134* 136  --  136  K 3.4* 3.1* 4.0 3.1*  CL 104 108  --  110  CO2 21* 21*  --  21*  GLUCOSE 162* 141*  --  119*  BUN 66* 63*  --  46*  CREATININE 3.41* 3.08*  --  2.66*  CALCIUM 8.7* 7.9*  --  8.1*  MG 2.7*  --   --   --   PHOS  --   --   --  2.1*   GFR: Estimated Creatinine Clearance: 26.8 mL/min (A) (by C-G formula based on SCr of 2.66 mg/dL (H)). Liver Function Tests: Recent Labs  Lab 07/28/23 1333 07/29/23 0332  AST 16 19  ALT 28 26  ALKPHOS 58 48  BILITOT 0.4 0.5  PROT 6.2* 5.0*  ALBUMIN 3.2* 2.7*   Recent Labs  Lab 07/28/23 1333  LIPASE 81*   No results for input(s): "AMMONIA" in the last 168 hours. Coagulation Profile: No results for input(s): "INR", "PROTIME" in the last 168 hours. Cardiac Enzymes: No results for input(s): "CKTOTAL", "CKMB", "CKMBINDEX", "TROPONINI" in the last 168 hours. BNP (last 3 results) No results for input(s): "PROBNP" in the last 8760  hours. HbA1C: Recent Labs    07/28/23 1333  HGBA1C 9.0*   CBG: Recent Labs  Lab 07/29/23 1552 07/29/23 2022 07/29/23 2344 07/30/23 0312 07/30/23 0827  GLUCAP 126* 104* 116* 95 117*   Lipid Profile: No results for input(s): "CHOL", "HDL", "LDLCALC", "TRIG", "CHOLHDL", "LDLDIRECT" in the last 72 hours. Thyroid Function Tests: Recent Labs    07/29/23 0332  TSH 0.603  FREET4 1.08   Anemia Panel: No results for input(s): "VITAMINB12", "FOLATE", "FERRITIN", "TIBC", "IRON", "RETICCTPCT" in the last 72 hours. Sepsis Labs: No results for input(s): "PROCALCITON", "LATICACIDVEN" in the last 168 hours.  Recent Results (from the past 240 hours)  Resp panel by RT-PCR (RSV, Flu A&B, Covid) Anterior Nasal Swab     Status: None   Collection Time: 07/28/23  7:04 PM   Specimen: Anterior Nasal Swab  Result Value Ref Range Status   SARS Coronavirus 2 by RT PCR NEGATIVE NEGATIVE Final    Comment: (NOTE) SARS-CoV-2 target nucleic acids are NOT DETECTED.  The SARS-CoV-2 RNA is generally detectable in upper respiratory specimens during the acute phase of infection. The lowest concentration of SARS-CoV-2 viral copies this assay can detect is 138 copies/mL. A negative result does not preclude SARS-Cov-2 infection and should not be used as the sole basis for treatment or other patient management decisions. A negative result may occur with  improper specimen collection/handling, submission of specimen other than nasopharyngeal swab, presence of viral mutation(s) within the areas targeted by this assay, and inadequate number of viral copies(<138 copies/mL). A negative result must be combined with clinical observations, patient history, and epidemiological information. The expected result is Negative.  Fact Sheet for Patients:  BloggerCourse.com  Fact Sheet for Healthcare Providers:  SeriousBroker.it  This test is no t yet approved or  cleared by the [place] FDA and  has been authorized for detection and/or diagnosis of SARS-CoV-2 by FDA under an Emergency Use Authorization (EUA). This EUA will remain  in effect (meaning this test can be used) for the duration of the COVID-19 declaration under Section 564(b)(1) of the Act, 21 U.S.C.section 360bbb-3(b)(1), unless the authorization is terminated  or revoked sooner.       Influenza A by PCR NEGATIVE NEGATIVE Final   Influenza B by PCR NEGATIVE NEGATIVE Final    Comment: (NOTE) The Xpert Xpress SARS-CoV-2/FLU/RSV plus assay is intended as an aid in the diagnosis of influenza from Nasopharyngeal swab specimens and should not be used as a sole basis for treatment. Nasal washings and aspirates are unacceptable for Xpert Xpress SARS-CoV-2/FLU/RSV testing.  Fact Sheet for Patients: BloggerCourse.com  Fact Sheet for Healthcare Providers: SeriousBroker.it  This test is not  yet approved or cleared by the [place] and has been authorized for detection and/or diagnosis of SARS-CoV-2 by FDA under an Emergency Use Authorization (EUA). This EUA will remain in effect (meaning this test can be used) for the duration of the COVID-19 declaration under Section 564(b)(1) of the Act, 21 U.S.C. section 360bbb-3(b)(1), unless the authorization is terminated or revoked.     Resp Syncytial Virus by PCR NEGATIVE NEGATIVE Final    Comment: (NOTE) Fact Sheet for Patients: BloggerCourse.com  Fact Sheet for Healthcare Providers: SeriousBroker.it  This test is not yet approved or cleared by the [place] FDA and has been authorized for detection and/or diagnosis of SARS-CoV-2 by FDA under an Emergency Use Authorization (EUA). This EUA will remain in effect (meaning this test can be used) for the duration of the COVID-19 declaration under Section 564(b)(1) of the Act, 21  U.S.C. section 360bbb-3(b)(1), unless the authorization is terminated or revoked.  Performed at [facility], [address] [place], [place] [address]          Radiology Studies: CT ABDOMEN PELVIS WO CONTRAST Result Date: 07/28/2023 CLINICAL DATA:  Sepsis diarrhea vomiting EXAM: CT ABDOMEN AND PELVIS WITHOUT CONTRAST TECHNIQUE: Multidetector CT imaging of the abdomen and pelvis was performed following the standard protocol without IV contrast. RADIATION DOSE REDUCTION: This exam was performed according to the departmental dose-optimization program which includes automated exposure control, adjustment of the mA and/or kV according to patient size and/or use of iterative reconstruction technique. COMPARISON:  CT 09/05/2022 FINDINGS: Lower chest: Lung bases demonstrate no acute airspace disease. Coronary vascular calcification. Hepatobiliary: Gallstones. No focal hepatic abnormality. No biliary dilatation Pancreas: Unremarkable. No pancreatic ductal dilatation or surrounding inflammatory changes. Spleen: Normal in size without focal abnormality. Adrenals/Urinary Tract: Adrenal glands are normal. Kidneys show no hydronephrosis. Small nonobstructing left kidney stone. 2.6 cm slightly dense right midpole renal lesion. Bladder partially obscured by artifact. Stable nonspecific perinephric fat stranding. Stomach/Bowel: Stomach nonenlarged. No dilated small bowel. Negative appendix. Diverticular disease of the left colon. No acute bowel wall thickening Vascular/Lymphatic: Advanced aortic atherosclerosis. No aneurysm. No suspicious lymph nodes. Reproductive: Obscured by artifact Other: Negative for pelvic effusion or free air. Small fat containing umbilical hernia Musculoskeletal: Posterior spinal fusion hardware fell 2 through L4. Right-sided fixing screw at L4 projects lateral to the right vertebral cortex. IMPRESSION: 1. No CT evidence for acute intra-abdominal or pelvic abnormality. 2.  Diverticular disease of the left colon without acute inflammatory process. 3. Gallstones. 4. Nonobstructing left kidney stone. 5. 2.6 cm slightly dense right midpole renal lesion, indeterminate without contrast on this exam. When the patient is clinically stable and able to follow directions and hold their breath (preferably as an outpatient) further evaluation with dedicated abdominal MRI should be considered. 6. Aortic atherosclerosis. Aortic Atherosclerosis (ICD10-I70.0). Electronically Signed   By: Jasmine Pang M.D.   On: 07/28/2023 22:12        Scheduled Meds:  heparin  5,000 Units Subcutaneous Q12H   pantoprazole (PROTONIX) IV  40 mg Intravenous Q12H   potassium chloride  40 mEq Oral BID   sodium chloride flush  3 mL Intravenous Q12H   thiamine (VITAMIN B1) injection  100 mg Intravenous Q24H   Continuous Infusions:     LOS: 1 day       Charise Killian, MD [facility] Pager [phone]  If 7PM-7AM, please contact night-coverage [email or web address]  07/30/2023, 8:40 AM

## 2023-07-31 DIAGNOSIS — N179 Acute kidney failure, unspecified: Secondary | ICD-10-CM | POA: Diagnosis not present

## 2023-07-31 LAB — CBC
HCT: 30.8 % — ABNORMAL LOW (ref 39.0–52.0)
Hemoglobin: 10.5 g/dL — ABNORMAL LOW (ref 13.0–17.0)
MCH: 30.9 pg (ref 26.0–34.0)
MCHC: 34.1 g/dL (ref 30.0–36.0)
MCV: 90.6 fL (ref 80.0–100.0)
Platelets: 562 10*3/uL — ABNORMAL HIGH (ref 150–400)
RBC: 3.4 MIL/uL — ABNORMAL LOW (ref 4.22–5.81)
RDW: 15.1 % (ref 11.5–15.5)
WBC: 7.3 10*3/uL (ref 4.0–10.5)
nRBC: 0.3 % — ABNORMAL HIGH (ref 0.0–0.2)

## 2023-07-31 LAB — GLUCOSE, CAPILLARY
Glucose-Capillary: 119 mg/dL — ABNORMAL HIGH (ref 70–99)
Glucose-Capillary: 125 mg/dL — ABNORMAL HIGH (ref 70–99)

## 2023-07-31 LAB — BASIC METABOLIC PANEL
Anion gap: 7 (ref 5–15)
BUN: 33 mg/dL — ABNORMAL HIGH (ref 8–23)
CO2: 19 mmol/L — ABNORMAL LOW (ref 22–32)
Calcium: 7.9 mg/dL — ABNORMAL LOW (ref 8.9–10.3)
Chloride: 109 mmol/L (ref 98–111)
Creatinine, Ser: 2.42 mg/dL — ABNORMAL HIGH (ref 0.61–1.24)
GFR, Estimated: 27 mL/min — ABNORMAL LOW (ref >60)
Glucose, Bld: 131 mg/dL — ABNORMAL HIGH (ref 70–99)
Potassium: 3.6 mmol/L (ref 3.5–5.1)
Sodium: 135 mmol/L (ref 135–145)

## 2023-07-31 MED ORDER — ISOSORBIDE MONONITRATE ER 30 MG PO TB24
60.0000 mg | ORAL_TABLET | Freq: Every day | ORAL | Status: DC
  Administered 2023-07-31: 60 mg via ORAL
  Filled 2023-07-31: qty 2

## 2023-07-31 MED ORDER — POTASSIUM CHLORIDE CRYS ER 20 MEQ PO TBCR
40.0000 meq | EXTENDED_RELEASE_TABLET | Freq: Once | ORAL | Status: AC
  Administered 2023-07-31: 40 meq via ORAL
  Filled 2023-07-31: qty 2

## 2023-07-31 MED ORDER — CARVEDILOL 6.25 MG PO TABS: 3.1250 mg | ORAL_TABLET | Freq: Two times a day (BID) | ORAL | Status: DC

## 2023-07-31 MED ORDER — AMLODIPINE BESYLATE 5 MG PO TABS
5.0000 mg | ORAL_TABLET | Freq: Every day | ORAL | Status: DC
  Administered 2023-07-31: 5 mg via ORAL
  Filled 2023-07-31: qty 1

## 2023-07-31 MED ORDER — SPIRONOLACTONE 25 MG PO TABS
25.0000 mg | ORAL_TABLET | Freq: Every day | ORAL | Status: DC
  Administered 2023-07-31: 25 mg via ORAL
  Filled 2023-07-31: qty 1

## 2023-07-31 MED ORDER — LOSARTAN POTASSIUM 50 MG PO TABS
100.0000 mg | ORAL_TABLET | Freq: Every day | ORAL | Status: DC
  Administered 2023-07-31: 100 mg via ORAL
  Filled 2023-07-31: qty 2

## 2023-07-31 MED ORDER — KETOCONAZOLE 2 % EX CREA: 1.0000 | TOPICAL_CREAM | Freq: Every evening | CUTANEOUS | Status: AC

## 2023-07-31 NOTE — TOC Initial Note (Signed)
Transition of Care [facility]) - Initial/Assessment Note    Patient Details  Name: Daniel Kline MRN: [id number] Date of Birth: 1945/10/20  Transition of Care [facility]) CM/SW Contact:    Daniel Cline, LCSW Phone Number: 07/31/2023, 11:55 AM  Clinical Narrative:                 CSW spoke with patient and spouse at bedside.  Patient is from home with spouse. PCP is Dr. Alphonsus Kline. Pharmacy is [facility]. Patient has a walker and cane at home. Spouse will provide transportation. Patient and spouse are agreeable to recs for HHPT. They prefer [facility] as they have used them several times in the past. CSW called Daniel Kline with [facility] - she states they do not have the staffing for PT for next week to be able to accept the referral at this time.  12:12- Updated spouse, she asked if they can wait a week to have Home Health services start if it means they have [facility]. They do not want another agency if possible.  Daniel Kline with [facility] stated they can accept the referral, start of care may be 08/02/23 or later. CSW notified patient's wife and MD. Daniel Kline aware of DC today.  Expected Discharge Plan: Home w Home Health Services Barriers to Discharge: Continued Medical Work up   Patient Goals and CMS Choice Patient states their goals for this hospitalization and ongoing recovery are:: home with home health CMS Medicare.gov Compare Post Acute Care list provided to:: Patient Choice offered to / list presented to : Patient, Spouse      Expected Discharge Plan and Services       Living arrangements for the past 2 months: Single Family Home                           HH Arranged: PT          Prior Living Arrangements/Services Living arrangements for the past 2 months: Single Family Home Lives with:: Spouse Patient language and need for interpreter reviewed:: Yes        Need for Family Participation in Patient Care: Yes (Comment) Care giver support system in place?: Yes  (comment) Current home services: DME Criminal Activity/Legal Involvement Pertinent to Current Situation/Hospitalization: No - Comment as needed  Activities of Daily Living      Permission Sought/Granted Permission sought to share information with : Facility Industrial/product designer granted to share information with : Yes, Verbal Permission Granted     Permission granted to share info w AGENCY: [facility]        Emotional Assessment       Orientation: : Oriented to Self, Oriented to Place, Oriented to  Time, Oriented to Situation Alcohol / Substance Use: Not Applicable Psych Involvement: No (comment)  Admission diagnosis:  Diarrhea of presumed infectious origin [R19.7] Diarrhea in adult patient [R19.7] AKI (acute kidney injury) (HCC) [N17.9] Acute renal failure superimposed on chronic kidney disease, unspecified acute renal failure type, unspecified CKD stage (HCC) [N17.9, N18.9] Patient Active Problem List   Diagnosis Date Noted   Diarrhea 07/29/2023   Electrolyte abnormality 07/29/2023   Personal history of atrial flutter 07/29/2023   AKI (acute kidney injury) (HCC) 07/29/2023   PAC (premature atrial contraction) 07/29/2023   Chronic venous insufficiency 06/17/2023   Cellulitis of right leg 10/22/2022   Controlled type 2 diabetes mellitus with diabetic nephropathy (HCC) 09/09/2022   Lower extremity edema 09/08/2022   Acute kidney injury  superimposed on chronic kidney disease (HCC) 09/05/2022   Type 2 diabetes mellitus with chronic kidney disease, without long-term current use of insulin (HCC) 09/05/2022   Dyslipidemia 09/05/2022   Paroxysmal atrial fibrillation (HCC) 01/23/2022   Mood disorder (HCC) 12/24/2021   Chronic pain 06/13/2020   Anemia 04/02/2020   Benign hypertensive kidney disease with chronic kidney disease 04/02/2020   Renal mass 04/02/2020   Secondary hyperparathyroidism of renal origin (HCC) 04/02/2020   Preventative health care  03/10/2020   Total knee replacement status 01/28/2020   Encounter for long-term (current) use of high-risk medication 06/14/2019   Seronegative rheumatoid arthritis (HCC) 06/14/2019   Chronic narcotic dependence (HCC) 05/18/2019   Anti-cyclic citrullinated peptide antibody positive 05/03/2019   Synovitis of hand 05/03/2019   Type 2 diabetes mellitus with diabetic nephropathy (HCC) 06/22/2018   Chronic bilateral low back pain without sciatica 06/09/2018   Knee pain, chronic 04/12/2018   OSA (obstructive sleep apnea) 09/07/2017   History of poliomyelitis 08/27/2017   Actinic keratosis 07/26/2017   History of total hip arthroplasty, bilateral 06/09/2016   GERD (gastroesophageal reflux disease) 04/21/2016   PONV (postoperative nausea and vomiting) 04/21/2016   Asymptomatic gallstones 01/06/2016   Leg ulcer (HCC) 10/29/2015   Stage 3b chronic kidney disease (HCC) 09/27/2015   Morbid obesity (HCC) 09/27/2015   Primary osteoarthritis of right knee 08/26/2015   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   Hypercholesterolemia 12/20/2011   Essential hypertension 01/12/2010   PCP:  Daniel Schwalbe, MD Pharmacy:   [facility] - [place], [place] - [address] [place] [address] Phone: [phone] Fax: [phone]  [facility]) - [place], [place] - [address] [facility] [address] [place] [address] [place] [address] Phone: [phone] Fax: [phone]  [id number] - [place], [place] - [address] [place] [address] Phone: [phone] Fax: [phone]  [facility] - [place], [place] - [address] [place] [address] Phone: [phone] Fax: [phone]     Social Drivers of Health (SDOH) Social History: SDOH Screenings   Food Insecurity: No Food Insecurity (07/30/2023)  Housing: Unknown (07/30/2023)  Transportation Needs:  No Transportation Needs (07/30/2023)  Utilities: Not At Risk (07/30/2023)  Depression (PHQ2-9): Low Risk  (10/25/2022)  Financial Resource Strain: Low Risk  (07/20/2017)  Social Connections: Unknown (07/20/2017)  Tobacco Use: Medium Risk (07/28/2023)   SDOH Interventions:     Readmission Risk Interventions    09/06/2022    3:47 PM 01/27/2022   12:27 PM  Readmission Risk Prevention Plan  Transportation Screening Complete Complete  PCP or Specialist Appt within 3-5 Days  Complete  HRI or Home Care Consult  Complete  Social Work Consult for Recovery Care Planning/Counseling  Complete  Palliative Care Screening  Complete  Medication Review Oceanographer) Complete Complete  SW Recovery Care/Counseling Consult Complete   Palliative Care Screening Not Applicable

## 2023-07-31 NOTE — Discharge Summary (Signed)
Physician Discharge Summary  Daniel Kline FAO:[id number] DOB: [birth year] DOA: 07/28/2023  PCP: [name], MD  Admit date: 07/28/2023 Discharge date: 07/31/2023  Admitted From: home  Disposition:  home   Recommendations for Outpatient Follow-up:  Follow up with PCP in 1-2 weeks   Home Health: yes Equipment/Devices:  Discharge Condition: stable  CODE STATUS: full  Diet recommendation: Heart Healthy / Carb Modified  Brief/Interim Summary: HPI was taken from Dr. Irena Cords: Daniel Kline is a 77 y.o. male with medical history significant for atrial flutter status post catheter ablation in September 2018, chronic HFpEF, hypertension, hyperlipidemia, diabetes, stage III chronic kidney disease, cutaneous T-cell lymphoma, chronic low back pain, and sleep apnea on CPAP, complaints of diarrhea since Friday and is not able to keep food down.  Has been using over-the-counter antidiarrheal agents which have helped somewhat but not resolved.  Reports of abdominal pain with this diarrhea.  No reports of travel sick contact cough shortness of breath difficulty speaking gait issues or falls or incontinence.   In emergency room vitals trend shows: Alert awake oriented afebrile O2 sats of 94% on room air.  EKG : irregular ? Type ii 2nd degree a/v block  Labs are notable for : Urinalysis shows clear urine with 0-5 WBCs and RBCs nitrite negative. Lipase of 81. CMP showing hypokalemia of 3.4 sodium 134 bicarb 21 AKI on CKD with a creatinine of 3.41 EGFR of 18 AST ALT within normal limits calcium 8.7 magnesium ordered and pending. CBC shows anemia with a hemoglobin of 12.3 normal white count and platelets of 437. Viral panel negative for COVID and flu and RSV.  Discharge Diagnoses:  Principal Problem:   Diarrhea Active Problems:   Paroxysmal atrial fibrillation (HCC)   Acute kidney injury superimposed on chronic kidney disease (HCC)   Essential hypertension   Type 2 diabetes mellitus  with chronic kidney disease, without long-term current use of insulin (HCC)   Asymptomatic gallstones   Anemia   Electrolyte abnormality   Personal history of atrial flutter   AKI (acute kidney injury) (HCC)   PAC (premature atrial contraction)  AKI on CKDIII: baseline Cr is unknown possibly IIIa vs IIIb. Cr is trending down daily. Continue on IVFs.    Hx A. flutter: typical as per cardio. Continue on dose of coreg. No evidence of reoccurrence as per cardio. Cardio recs apprec & signed off    Diarrhea: no diarrhea again today. D/c GI PCR panel & c. diff. Resolved    DM2: HbA1c 9.0, poorly controlled. Continue on SSI w/ accuchecks    Hypokalemia: WNL today    Normocytic anemia: H&H are stable.    Renal lesion: incidental finding on CT which recommends for MRI but pt says he has metal in his body so will likely need repeat CT as an outpatient to f/u. Pt verbalized his understanding    Thrombocytosis: etiology unclear. Will continue to monitor    Obesity: BMI 36.4. would benefit from weight loss   Discharge Instructions  Discharge Instructions     Diet - low sodium heart healthy   Complete by: As directed    Diet Carb Modified   Complete by: As directed    Discharge instructions   Complete by: As directed    F/u w/ PCP in 1-2 weeks. Will need to get BMP to check Cr/GFR (kidney function).  Also will need to another repeat CT abd/pelvis or MRI to f/u on the following on CT this admission:  2.6 cm  slightly dense right midpole renal lesion, indeterminate without contrast on this exam. When the patient is clinically stable and able to follow directions and hold their breath (preferably as an outpatient) further evaluation with dedicated abdominal MRI should be considered.   Increase activity slowly   Complete by: As directed       Allergies as of 07/31/2023       Reactions   Bee Venom Anaphylaxis   Oxycodone Other (See Comments)   Delusions   Hydromorphone Other (See  Comments)   hallucinating   Hydroxychloroquine    Other reaction(s): Other (See Comments) He broke out really badly.   Zolpidem Other (See Comments)        Medication List     STOP taking these medications    amoxicillin-clavulanate 875-125 MG tablet Commonly known as: AUGMENTIN       TAKE these medications    amLODipine 5 MG tablet Commonly known as: NORVASC TAKE 1 TABLET BY MOUTH DAILY   bismuth subsalicylate 262 MG/15ML suspension Commonly known as: PEPTO BISMOL Take 30 mLs by mouth every 6 (six) hours as needed for indigestion or diarrhea or loose stools.   calcitRIOL 0.25 MCG capsule Commonly known as: ROCALTROL Take 0.25 mcg by mouth daily.   calcium carbonate 1500 (600 Ca) MG Tabs tablet Commonly known as: OSCAL Take 600 mg of elemental calcium by mouth daily with breakfast.   carvedilol 3.125 MG tablet Commonly known as: COREG TAKE 1 TABLET BY MOUTH 2 TIMES DAILY WITH A MEAL.   clorazepate 7.5 MG tablet Commonly known as: TRANXENE TAKE 1 TABLET BY MOUTH TWICE A DAY AS NEEDED FOR ANXIETY   cyanocobalamin 500 MCG tablet Commonly known as: VITAMIN B12 Take 1,000 mcg by mouth daily.   EPINEPHrine 0.3 mg/0.3 mL Soaj injection Commonly known as: EPI-PEN Inject 0.3 mg into the muscle as needed for anaphylaxis.   folic acid 1 MG tablet Commonly known as: FOLVITE Take 1 mg by mouth daily. Every day except day that he takes methotrexate   furosemide 40 MG tablet Commonly known as: LASIX Take 1 tablet (40 mg total) by mouth daily.   gabapentin 300 MG capsule Commonly known as: NEURONTIN TAKE 1 CAPSULE BY MOUTH 3 TIMES  DAILY   HYDROcodone-acetaminophen 5-325 MG tablet Commonly known as: NORCO/VICODIN TAKE 1 TABLET BY MOUTH EVERY 6 HOURS AS NEEDED FOR PAIN   hydrocortisone 2.5 % cream Apply topically 3 (three) times daily as needed.   hydrOXYzine 25 MG tablet Commonly known as: ATARAX TAKE ONE TABLET EVERY EIGHT HOURS AS NEEDED   IRON PO Take  1 tablet by mouth at bedtime.   isosorbide mononitrate 60 MG 24 hr tablet Commonly known as: IMDUR Take 1 tablet (60 mg total) by mouth daily before lunch.   ketoconazole 2 % cream Commonly known as: NIZORAL Apply 1 Application topically at bedtime.   latanoprost 0.005 % ophthalmic solution Commonly known as: XALATAN Place 1 drop into both eyes at bedtime.   losartan 100 MG tablet Commonly known as: COZAAR TAKE 1 TABLET BY MOUTH DAILY   MAGNESIUM PO Take 1 tablet by mouth daily.   methotrexate 2.5 MG tablet Commonly known as: RHEUMATREX Take 5 mg by mouth once a week.   OneTouch Ultra test strip Generic drug: glucose blood 1 each by Other route daily. Use to check blood sugar once daily   Ozempic (0.25 or 0.5 MG/DOSE) 2 MG/3ML Sopn Generic drug: Semaglutide(0.25 or 0.5MG /DOS) Inject 0.25 mg into the skin once a week.  rosuvastatin 10 MG tablet Commonly known as: Crestor Take 1 tablet (10 mg total) by mouth daily.   spironolactone 25 MG tablet Commonly known as: ALDACTONE Take 25 mg by mouth daily.   timolol 0.5 % ophthalmic solution Commonly known as: TIMOPTIC Place 1 drop into both eyes 2 (two) times daily.   Vitamin D-3 25 MCG (1000 UT) Caps Take 1,000 Units by mouth daily.        Allergies  Allergen Reactions   Bee Venom Anaphylaxis   Oxycodone Other (See Comments)    Delusions   Hydromorphone Other (See Comments)    hallucinating   Hydroxychloroquine     Other reaction(s): Other (See Comments) He broke out really badly.   Zolpidem Other (See Comments)    Consultations: Cardio    Procedures/Studies: CT ABDOMEN PELVIS WO CONTRAST Result Date: 07/28/2023 CLINICAL DATA:  Sepsis diarrhea vomiting EXAM: CT ABDOMEN AND PELVIS WITHOUT CONTRAST TECHNIQUE: Multidetector CT imaging of the abdomen and pelvis was performed following the standard protocol without IV contrast. RADIATION DOSE REDUCTION: This exam was performed according to the departmental  dose-optimization program which includes automated exposure control, adjustment of the mA and/or kV according to patient size and/or use of iterative reconstruction technique. COMPARISON:  CT 09/05/2022 FINDINGS: Lower chest: Lung bases demonstrate no acute airspace disease. Coronary vascular calcification. Hepatobiliary: Gallstones. No focal hepatic abnormality. No biliary dilatation Pancreas: Unremarkable. No pancreatic ductal dilatation or surrounding inflammatory changes. Spleen: Normal in size without focal abnormality. Adrenals/Urinary Tract: Adrenal glands are normal. Kidneys show no hydronephrosis. Small nonobstructing left kidney stone. 2.6 cm slightly dense right midpole renal lesion. Bladder partially obscured by artifact. Stable nonspecific perinephric fat stranding. Stomach/Bowel: Stomach nonenlarged. No dilated small bowel. Negative appendix. Diverticular disease of the left colon. No acute bowel wall thickening Vascular/Lymphatic: Advanced aortic atherosclerosis. No aneurysm. No suspicious lymph nodes. Reproductive: Obscured by artifact Other: Negative for pelvic effusion or free air. Small fat containing umbilical hernia Musculoskeletal: Posterior spinal fusion hardware fell 2 through L4. Right-sided fixing screw at L4 projects lateral to the right vertebral cortex. IMPRESSION: 1. No CT evidence for acute intra-abdominal or pelvic abnormality. 2. Diverticular disease of the left colon without acute inflammatory process. 3. Gallstones. 4. Nonobstructing left kidney stone. 5. 2.6 cm slightly dense right midpole renal lesion, indeterminate without contrast on this exam. When the patient is clinically stable and able to follow directions and hold their breath (preferably as an outpatient) further evaluation with dedicated abdominal MRI should be considered. 6. Aortic atherosclerosis. Aortic Atherosclerosis (ICD10-I70.0). Electronically Signed   By: Jasmine Pang M.D.   On: 07/28/2023 22:12   (Echo,  Carotid, EGD, Colonoscopy, ERCP)    Subjective: Pt c/o fatigue    Discharge Exam: Vitals:   07/31/23 0400 07/31/23 1204  BP: (!) 179/113 (!) 157/83  Pulse: 100 (!) 56  Resp:  18  Temp: 97.9 F (36.6 C)   SpO2:  100%   Vitals:   07/30/23 2000 07/30/23 2129 07/31/23 0400 07/31/23 1204  BP: (!) 184/95 (!) 162/67 (!) 179/113 (!) 157/83  Pulse: 78 96 100 (!) 56  Resp:  16  18  Temp: 98.6 F (37 C) 98.8 F (37.1 C) 97.9 F (36.6 C)   TempSrc: Oral Oral Oral   SpO2:  95%  100%  Weight:   101.6 kg   Height:        General: Pt is alert, awake, not in acute distress Cardiovascular: S1/S2 +, no rubs, no gallops Respiratory: CTA bilaterally, no wheezing,  no rhonchi Abdominal: Soft, NT, obese, bowel sounds + Extremities:  no cyanosis    The results of significant diagnostics from this hospitalization (including imaging, microbiology, ancillary and laboratory) are listed below for reference.     Microbiology: Recent Results (from the past 240 hours)  Resp panel by RT-PCR (RSV, Flu A&B, Covid) Anterior Nasal Swab     Status: None   Collection Time: 07/28/23  7:04 PM   Specimen: Anterior Nasal Swab  Result Value Ref Range Status   SARS Coronavirus 2 by RT PCR NEGATIVE NEGATIVE Final    Comment: (NOTE) SARS-CoV-2 target nucleic acids are NOT DETECTED.  The SARS-CoV-2 RNA is generally detectable in upper respiratory specimens during the acute phase of infection. The lowest concentration of SARS-CoV-2 viral copies this assay can detect is 138 copies/mL. A negative result does not preclude SARS-Cov-2 infection and should not be used as the sole basis for treatment or other patient management decisions. A negative result may occur with  improper specimen collection/handling, submission of specimen other than nasopharyngeal swab, presence of viral mutation(s) within the areas targeted by this assay, and inadequate number of viral copies(<138 copies/mL). A negative result must  be combined with clinical observations, patient history, and epidemiological information. The expected result is Negative.  Fact Sheet for Patients:  BloggerCourse.com  Fact Sheet for Healthcare Providers:  SeriousBroker.it  This test is no t yet approved or cleared by the [place] FDA and  has been authorized for detection and/or diagnosis of SARS-CoV-2 by FDA under an Emergency Use Authorization (EUA). This EUA will remain  in effect (meaning this test can be used) for the duration of the COVID-19 declaration under Section 564(b)(1) of the Act, 21 U.S.C.section 360bbb-3(b)(1), unless the authorization is terminated  or revoked sooner.       Influenza A by PCR NEGATIVE NEGATIVE Final   Influenza B by PCR NEGATIVE NEGATIVE Final    Comment: (NOTE) The Xpert Xpress SARS-CoV-2/FLU/RSV plus assay is intended as an aid in the diagnosis of influenza from Nasopharyngeal swab specimens and should not be used as a sole basis for treatment. Nasal washings and aspirates are unacceptable for Xpert Xpress SARS-CoV-2/FLU/RSV testing.  Fact Sheet for Patients: BloggerCourse.com  Fact Sheet for Healthcare Providers: SeriousBroker.it  This test is not yet approved or cleared by the [place] FDA and has been authorized for detection and/or diagnosis of SARS-CoV-2 by FDA under an Emergency Use Authorization (EUA). This EUA will remain in effect (meaning this test can be used) for the duration of the COVID-19 declaration under Section 564(b)(1) of the Act, 21 U.S.C. section 360bbb-3(b)(1), unless the authorization is terminated or revoked.     Resp Syncytial Virus by PCR NEGATIVE NEGATIVE Final    Comment: (NOTE) Fact Sheet for Patients: BloggerCourse.com  Fact Sheet for Healthcare Providers: SeriousBroker.it  This test is not  yet approved or cleared by the [place] FDA and has been authorized for detection and/or diagnosis of SARS-CoV-2 by FDA under an Emergency Use Authorization (EUA). This EUA will remain in effect (meaning this test can be used) for the duration of the COVID-19 declaration under Section 564(b)(1) of the Act, 21 U.S.C. section 360bbb-3(b)(1), unless the authorization is terminated or revoked.  Performed at [facility], [address] [place], [place] [address]      Labs: BNP (last 3 results) No results for input(s): "BNP" in the last 8760 hours. Basic Metabolic Panel: Recent Labs  Lab 07/28/23 1333 07/29/23 0332 07/29/23 1921 07/30/23  0447 07/31/23 0259  NA 134* 136  --  136 135  K 3.4* 3.1* 4.0 3.1* 3.6  CL 104 108  --  110 109  CO2 21* 21*  --  21* 19*  GLUCOSE 162* 141*  --  119* 131*  BUN 66* 63*  --  46* 33*  CREATININE 3.41* 3.08*  --  2.66* 2.42*  CALCIUM 8.7* 7.9*  --  8.1* 7.9*  MG 2.7*  --   --   --   --   PHOS  --   --   --  2.1*  --    Liver Function Tests: Recent Labs  Lab 07/28/23 1333 07/29/23 0332  AST 16 19  ALT 28 26  ALKPHOS 58 48  BILITOT 0.4 0.5  PROT 6.2* 5.0*  ALBUMIN 3.2* 2.7*   Recent Labs  Lab 07/28/23 1333  LIPASE 81*   No results for input(s): "AMMONIA" in the last 168 hours. CBC: Recent Labs  Lab 07/28/23 1333 07/29/23 0332 07/30/23 0447 07/31/23 0259  WBC 5.0 5.7 6.5 7.3  HGB 12.3* 10.4* 10.4* 10.5*  HCT 36.6* 30.7* 30.1* 30.8*  MCV 90.6 91.9 89.9 90.6  PLT 437* 433* 491* 562*   Cardiac Enzymes: No results for input(s): "CKTOTAL", "CKMB", "CKMBINDEX", "TROPONINI" in the last 168 hours. BNP: Invalid input(s): "POCBNP" CBG: Recent Labs  Lab 07/30/23 1156 07/30/23 1713 07/30/23 1952 07/31/23 0017 07/31/23 1137  GLUCAP 150* 142* 154* 119* 125*   D-Dimer No results for input(s): "DDIMER" in the last 72 hours. Hgb A1c Recent Labs    07/28/23 1333  HGBA1C 9.0*   Lipid Profile No results for  input(s): "CHOL", "HDL", "LDLCALC", "TRIG", "CHOLHDL", "LDLDIRECT" in the last 72 hours. Thyroid function studies Recent Labs    07/29/23 0332  TSH 0.603   Anemia work up No results for input(s): "VITAMINB12", "FOLATE", "FERRITIN", "TIBC", "IRON", "RETICCTPCT" in the last 72 hours. Urinalysis    Component Value Date/Time   COLORURINE YELLOW (A) 07/28/2023 2139   APPEARANCEUR CLEAR (A) 07/28/2023 2139   APPEARANCEUR Clear 10/18/2011 0958   LABSPEC 1.017 07/28/2023 2139   LABSPEC 1.021 10/18/2011 0958   PHURINE 5.0 07/28/2023 2139   GLUCOSEU 50 (A) 07/28/2023 2139   GLUCOSEU Negative 10/18/2011 0958   HGBUR NEGATIVE 07/28/2023 2139   BILIRUBINUR NEGATIVE 07/28/2023 2139   BILIRUBINUR Negative 10/18/2011 0958   KETONESUR NEGATIVE 07/28/2023 2139   PROTEINUR 100 (A) 07/28/2023 2139   NITRITE NEGATIVE 07/28/2023 2139   LEUKOCYTESUR NEGATIVE 07/28/2023 2139   LEUKOCYTESUR Negative 10/18/2011 0958   Sepsis Labs Recent Labs  Lab 07/28/23 1333 07/29/23 0332 07/30/23 0447 07/31/23 0259  WBC 5.0 5.7 6.5 7.3   Microbiology Recent Results (from the past 240 hours)  Resp panel by RT-PCR (RSV, Flu A&B, Covid) Anterior Nasal Swab     Status: None   Collection Time: 07/28/23  7:04 PM   Specimen: Anterior Nasal Swab  Result Value Ref Range Status   SARS Coronavirus 2 by RT PCR NEGATIVE NEGATIVE Final    Comment: (NOTE) SARS-CoV-2 target nucleic acids are NOT DETECTED.  The SARS-CoV-2 RNA is generally detectable in upper respiratory specimens during the acute phase of infection. The lowest concentration of SARS-CoV-2 viral copies this assay can detect is 138 copies/mL. A negative result does not preclude SARS-Cov-2 infection and should not be used as the sole basis for treatment or other patient management decisions. A negative result may occur with  improper specimen collection/handling, submission of specimen other than nasopharyngeal swab, presence  of viral mutation(s) within  the areas targeted by this assay, and inadequate number of viral copies(<138 copies/mL). A negative result must be combined with clinical observations, patient history, and epidemiological information. The expected result is Negative.  Fact Sheet for Patients:  BloggerCourse.com  Fact Sheet for Healthcare Providers:  SeriousBroker.it  This test is no t yet approved or cleared by the [place] FDA and  has been authorized for detection and/or diagnosis of SARS-CoV-2 by FDA under an Emergency Use Authorization (EUA). This EUA will remain  in effect (meaning this test can be used) for the duration of the COVID-19 declaration under Section 564(b)(1) of the Act, 21 U.S.C.section 360bbb-3(b)(1), unless the authorization is terminated  or revoked sooner.       Influenza A by PCR NEGATIVE NEGATIVE Final   Influenza B by PCR NEGATIVE NEGATIVE Final    Comment: (NOTE) The Xpert Xpress SARS-CoV-2/FLU/RSV plus assay is intended as an aid in the diagnosis of influenza from Nasopharyngeal swab specimens and should not be used as a sole basis for treatment. Nasal washings and aspirates are unacceptable for Xpert Xpress SARS-CoV-2/FLU/RSV testing.  Fact Sheet for Patients: BloggerCourse.com  Fact Sheet for Healthcare Providers: SeriousBroker.it  This test is not yet approved or cleared by the [place] FDA and has been authorized for detection and/or diagnosis of SARS-CoV-2 by FDA under an Emergency Use Authorization (EUA). This EUA will remain in effect (meaning this test can be used) for the duration of the COVID-19 declaration under Section 564(b)(1) of the Act, 21 U.S.C. section 360bbb-3(b)(1), unless the authorization is terminated or revoked.     Resp Syncytial Virus by PCR NEGATIVE NEGATIVE Final    Comment: (NOTE) Fact Sheet for  Patients: BloggerCourse.com  Fact Sheet for Healthcare Providers: SeriousBroker.it  This test is not yet approved or cleared by the [place] FDA and has been authorized for detection and/or diagnosis of SARS-CoV-2 by FDA under an Emergency Use Authorization (EUA). This EUA will remain in effect (meaning this test can be used) for the duration of the COVID-19 declaration under Section 564(b)(1) of the Act, 21 U.S.C. section 360bbb-3(b)(1), unless the authorization is terminated or revoked.  Performed at [facility], [address], [place], [place] [address]      Time coordinating discharge: Over 30 minutes  SIGNED:   Charise Killian, MD  [facility] 07/31/2023, 12:54 PM Pager   If 7PM-7AM, please contact night-coverage

## 2023-07-31 NOTE — Progress Notes (Signed)
PHARMACY CONSULT NOTE - FOLLOW UP  Pharmacy Consult for Electrolyte Monitoring and Replacement   Recent Labs: Potassium (mmol/L)  Date Value  07/31/2023 3.6  10/28/2011 3.9   Magnesium (mg/dL)  Date Value  86/57/8469 2.7 (H)   Calcium (mg/dL)  Date Value  62/95/2841 7.9 (L)   Calcium, Total (mg/dL)  Date Value  32/44/0102 7.9 (L)   Albumin (g/dL)  Date Value  72/53/6644 2.7 (L)   Phosphorus (mg/dL)  Date Value  03/47/4259 2.1 (L)   Sodium (mmol/L)  Date Value  07/31/2023 135  10/28/2011 141     Assessment: 77 y.o. male with medical history significant for atrial flutter status post catheter ablation in September 2018, chronic HFpEF, hypertension, hyperlipidemia, diabetes, stage III chronic kidney disease, cutaneous T-cell lymphoma, chronic low back pain, and sleep apnea on CPAP, complaints of diarrhea since Friday and is not able to keep food down.   Goal of Therapy:  K+ > 4. Mg > 2  Plan:  Kcl 40 mEq x 1 F/u with AM labs.   Ronnald Ramp ,PharmD Clinical Pharmacist 07/31/2023 8:00 AM

## 2023-07-31 NOTE — Plan of Care (Signed)

## 2023-08-01 ENCOUNTER — Telehealth: Payer: Self-pay | Admitting: Nurse Practitioner

## 2023-08-01 ENCOUNTER — Telehealth: Payer: Self-pay

## 2023-08-01 NOTE — Patient Outreach (Signed)
Care Management  Transitions of Care Program Transitions of Care Post-discharge Initial    08/01/2023 Name: Daniel Kline MRN: [id number] DOB: [birth year]  Subjective: Daniel Kline is a 77 y.o. year old male who is a primary care patient of [name], MD. The Care Management team Engaged with patient Engaged with patient by telephone to assess and address transitions of care needs.   Consent to Services:  Patient was given information about care management services, agreed to services, and gave verbal consent to participate.   Assessment:  Date of Discharge: 07/31/23 Discharge Facility: [facility]) Type of Discharge: Inpatient Admission Primary Inpatient Discharge Diagnosis:: Dehydration  SDOH Interventions    Flowsheet Row Telephone from 08/01/2023 in [place] POPULATION HEALTH DEPARTMENT Care Coordination from 10/21/2022 in [facility] Community Care Coordination  SDOH Interventions    Food Insecurity Interventions Intervention Not Indicated Intervention Not Indicated  Housing Interventions Intervention Not Indicated Intervention Not Indicated  Transportation Interventions Intervention Not Indicated Intervention Not Indicated  Utilities Interventions Intervention Not Indicated --        Goals Addressed             This Visit's Progress    Patient Stated       Current Barriers:  Knowledge Deficits related to plan of care for management of DMII  Chronic Disease Management support and education needs related to DMII   RNCM Clinical Goal(s):  Patient will work with the Care Management team over the next 30 days to address Transition of Care Barriers: Medication Management Diet/Nutrition/Food Resources Support at home Provider appointments Home Health services Equipment/DME take all medications exactly as prescribed and will call provider for medication related questions as evidenced by No hospital admission   through collaboration with RN Care manager, provider, and care team.   Interventions: Evaluation of current treatment plan related to  self management and patient's adherence to plan as established by provider   Diabetes Interventions:  (Status:  New goal.) Short Term Goal Assessed patient's understanding of [name]: <7% Provided education to patient about basic DM disease process Reviewed medications with patient and discussed importance of medication adherence Discussed plans with patient for ongoing care management follow up and provided patient with direct contact information for care management team Reviewed scheduled/upcoming provider appointments including: PCP 08/16/23 Lab Results  Component Value Date   HGBA1C 9.0 (H) 07/28/2023    Patient Goals/Self-Care Activities: Participate in Transition of Care Program/Attend [facility] scheduled calls Notify RN Care Manager of [facility] call rescheduling needs Take all medications as prescribed Attend all scheduled provider appointments Call pharmacy for medication refills 3-7 days in advance of running out of medications Call provider office for new concerns or questions   Follow Up Plan:  Telephone follow up appointment with care management team member scheduled for:  Tuesday December 24th at 2:30pm          Interventions Today    Flowsheet Row Most Recent Value  Chronic Disease   Chronic disease during today's visit Diabetes  General Interventions   General Interventions Discussed/Reviewed General Interventions Discussed  Doctor Visits Discussed/Reviewed Doctor Visits Discussed  Education Interventions   Education Provided Provided Education  Provided Verbal Education On Medication, When to see the doctor       The patient is not wanting to take his Ozempic because he thinks it is making him have loose stools. PCP notified. RNCM to follow up in 24 hours and encourage the administration of the  medication. Telephone interview was  interrupted several times because of communication issues of his phone.  Please refer to Care Plan for goals and interventions.  Patient educated on red flags s/s to watch for and was encouraged to report any of these identified, any new symptoms, changes in baseline or medication regimen, change in health status / well-being, or safety concerns to PCP and / or the [facility] Case Management team.   Patient is at high risk for readmission and/or has history of high utilization of ED.  Discussed [facility] TOC program and weekly calls to patient to assess condition/status, medication management and provide support/education as indicated. Patient/ Caregiver voiced understanding and is agreeable to 30 day program     Deidre Ala, Programmer, systems [facility]Population [facility] [phone]

## 2023-08-01 NOTE — Telephone Encounter (Signed)
Daniel Kline calling, she said, they need to confirm the diagnosis code for a heart monitor that was ordered 07/16/22 by Daniel Kline for this pt.

## 2023-08-01 NOTE — Telephone Encounter (Signed)
Spoke to pt's wife. She wants to stop the Ozempic. I removed it from his med list. She will keep a watch on his blood sugar and let us know if it starts to go up.

## 2023-08-01 NOTE — Telephone Encounter (Signed)
-----   Message from Tillman Abide sent at 08/01/2023  4:25 PM EST ----- Regarding: RE: Medication Non Adherence I agree with stopping the ozempic. Depending on his dose, we could consider a lower dose. He has been on glipizide and jardiance in the past If his sugars go up, I would probably prefer restarting the jardiance unless he had problems with it ----- Message ----- From: Redge Gainer, RN Sent: 08/01/2023   4:14 PM EST To: [name], MD Subject: Medication Non Adherence                       TOC Outreach to this patient today.  He was in the hospital for severe diarrhea and dehydration for three days 12/20 - 12/22. His spouse feels that it was due to Ozempic and therefore she wants to stop the injections. He is due tomorrow for the shot and she states she is not giving it to him. I encouraged her not to do this until he had his follow up appointment with you. She states they cannot come in this week because their heat is out and she doesn't want to take him out of the house. You are on vacation the first week in January so his Hospital follow up is January 7th. I will follow up with him tomorrow and see what is blood glucose reading is and encourage her to give him the Ozempic. I don't see an oral agent for DM.  Deidre Ala, RN Medical illustrator [facility]Population [facility] [phone]

## 2023-08-01 NOTE — Telephone Encounter (Signed)
[employer] called and provided with diagnosis code for unspecified atrial flutter, "I48.92" provided - as they reported the current code of "unspecified arhythmia" is no longer valid

## 2023-08-02 ENCOUNTER — Other Ambulatory Visit: Payer: Self-pay

## 2023-08-02 NOTE — Patient Instructions (Signed)
Visit Information  Thank you for taking time to visit with me today. Please don't hesitate to contact me if I can be of assistance to you before our next scheduled telephone appointment.  Following is a copy of your care plan:   Goals Addressed             This Visit's Progress    Patient Stated       Current Barriers:  Knowledge Deficits related to plan of care for management of DMII  Chronic Disease Management support and education needs related to DMII   RNCM Clinical Goal(s):  Patient will work with the Care Management team over the next 30 days to address Transition of Care Barriers: Medication Management Diet/Nutrition/Food Resources Support at home Provider appointments Home Health services Equipment/DME take all medications exactly as prescribed and will call provider for medication related questions as evidenced by No hospital admission  through collaboration with RN Care manager, provider, and care team.   Interventions: Evaluation of current treatment plan related to  self management and patient's adherence to plan as established by provider   Diabetes Interventions:  (Status:  New goal.) Short Term Goal Assessed patient's understanding of [name]: <7% Provided education to patient about basic DM disease process Reviewed medications with patient and discussed importance of medication adherence Discussed plans with patient for ongoing care management follow up and provided patient with direct contact information for care management team Reviewed scheduled/upcoming provider appointments including: PCP 08/16/23 Lab Results  Component Value Date   HGBA1C 9.0 (H) 07/28/2023    Patient Goals/Self-Care Activities: Participate in Transition of Care Program/Attend [facility] scheduled calls Notify RN Care Manager of [facility] call rescheduling needs Take all medications as prescribed Attend all scheduled provider appointments Call pharmacy for medication refills 3-7 days in advance  of running out of medications Call provider office for new concerns or questions   Follow Up Plan:  Telephone follow up appointment with care management team member scheduled for:  Friday December 27th at 11:00am          The patient verbalized understanding of instructions, educational materials, and care plan provided today and agreed to receive a mailed copy of patient instructions, educational materials, and care plan.   Please call the care guide team at [phone] if you need to cancel or reschedule your appointment.   Please call the Suicide and Crisis Lifeline: 988 call the [place] National Suicide Prevention Lifeline: (612)431-0903 or TTY: (435) 080-5301 TTY 9061849046) to talk to a trained counselor if you are experiencing a Mental Health or Behavioral Health Crisis or need someone to talk to.  Deidre Ala, RN Medical illustrator [facility] [phone]

## 2023-08-02 NOTE — Patient Outreach (Signed)
Care Management  Transitions of Care Program Transitions of Care Post-discharge Follow up   08/02/2023 Name: Daniel Kline MRN: [id number] DOB: [birth year]  Subjective: Daniel Kline is a 77 y.o. year old male who is a primary care patient of [name], MD. The Care Management team Engaged with patient Engaged with patient by telephone to assess and address transitions of care needs.   Consent to Services:  Patient was given information about care management services, agreed to services, and gave verbal consent to participate.   Assessment: Outreach completed today. PCP confirmed stopping Ozempic at this time and watching the blood glucose readings. Today his blood sugar was 142. He has been eating and drinking more since discharge and stopping the medication. Home Health PT to start next week. RNCM educated the patient and spouse about monitoring the blood glucose readings and that if the numbers are trending up he may need to go back on an oral agent. RNCM to follow up after the Christmas holiday to check numbers. Last A1c was 9.0  SDOH Interventions    Flowsheet Row Telephone from 08/01/2023 in [place] POPULATION HEALTH DEPARTMENT Care Coordination from 10/21/2022 in [facility] Community Care Coordination  SDOH Interventions    Food Insecurity Interventions Intervention Not Indicated Intervention Not Indicated  Housing Interventions Intervention Not Indicated Intervention Not Indicated  Transportation Interventions Intervention Not Indicated Intervention Not Indicated  Utilities Interventions Intervention Not Indicated --        Goals Addressed             This Visit's Progress    Patient Stated       Current Barriers:  Knowledge Deficits related to plan of care for management of DMII  Chronic Disease Management support and education needs related to DMII   RNCM Clinical Goal(s):  Patient will work with the Care Management team over the next  30 days to address Transition of Care Barriers: Medication Management Diet/Nutrition/Food Resources Support at home Provider appointments Home Health services Equipment/DME take all medications exactly as prescribed and will call provider for medication related questions as evidenced by No hospital admission  through collaboration with RN Care manager, provider, and care team.   Interventions: Evaluation of current treatment plan related to  self management and patient's adherence to plan as established by provider   Diabetes Interventions:  (Status:  New goal.) Short Term Goal Assessed patient's understanding of [name]: <7% Provided education to patient about basic DM disease process Reviewed medications with patient and discussed importance of medication adherence Discussed plans with patient for ongoing care management follow up and provided patient with direct contact information for care management team Reviewed scheduled/upcoming provider appointments including: PCP 08/16/23 Lab Results  Component Value Date   HGBA1C 9.0 (H) 07/28/2023    Patient Goals/Self-Care Activities: Participate in Transition of Care Program/Attend [facility] scheduled calls Notify RN Care Manager of [facility] call rescheduling needs Take all medications as prescribed Attend all scheduled provider appointments Call pharmacy for medication refills 3-7 days in advance of running out of medications Call provider office for new concerns or questions   Follow Up Plan:  Telephone follow up appointment with care management team member scheduled for:  Friday December 27th at 11:00am         Routine follow-up and on-going assessment evaluation and education of disease processes, and recommended interventions for both chronic and acute medical conditions, will occur during each weekly visit during [facility] 30-day Program Outreach calls along with  ongoing review of symptoms, medication reviews and reconciliation. Any updates,  inconsistencies, discrepancies or acute care concerns will be addressed on the Care Plan and routed to the correct Practitioner if indicated.  Patient educated on red flags s/s to watch for and was encouraged to report any of these identified, any new symptoms, changes in baseline or medication regimen, change in health status / well-being, or safety concerns to PCP and / or the [facility] Case Management team.   Deidre Ala, RN RN Care Manager [facility] [phone]

## 2023-08-05 ENCOUNTER — Other Ambulatory Visit: Payer: Self-pay

## 2023-08-05 NOTE — Patient Outreach (Signed)
Care Management  Transitions of Care Program Transitions of Care Post-discharge Outreach #4  Name: Daniel Kline MRN: [id number] DOB: 1946-05-13  Subjective: Daniel Kline is a 77 y.o. year old male who is a primary care patient of [name], MD. The Care Management team Engaged with patient Engaged with patient by telephone to assess and address transitions of care needs.   Consent to Services:  Patient was given information about care management services, agreed to services, and gave verbal consent to participate.   Assessment:           SDOH Interventions    Flowsheet Row Telephone from 08/01/2023 in [place] POPULATION HEALTH DEPARTMENT Care Coordination from 10/21/2022 in [facility] Care Coordination  SDOH Interventions    Food Insecurity Interventions Intervention Not Indicated Intervention Not Indicated  Housing Interventions Intervention Not Indicated Intervention Not Indicated  Transportation Interventions Intervention Not Indicated Intervention Not Indicated  Utilities Interventions Intervention Not Indicated --        Goals Addressed             This Visit's Progress    Patient Stated       Current Barriers:  Knowledge Deficits related to plan of care for management of DMII  Chronic Disease Management support and education needs related to DMII   RNCM Clinical Goal(s):  Patient will work with the Care Management team over the next 30 days to address Transition of Care Barriers: Medication Management Diet/Nutrition/Food Resources Support at home Provider appointments Home Health services Equipment/DME take all medications exactly as prescribed and will call provider for medication related questions as evidenced by No hospital admission  through collaboration with RN Care manager, provider, and care team.   Interventions: Evaluation of current treatment plan related to  self management and patient's adherence to plan  as established by provider   Diabetes Interventions:  (Status:  New goal.) Short Term Goal Assessed patient's understanding of [name]: <7% Provided education to patient about basic DM disease process Reviewed medications with patient and discussed importance of medication adherence Discussed plans with patient for ongoing care management follow up and provided patient with direct contact information for care management team Reviewed scheduled/upcoming provider appointments including: PCP 08/16/23 Lab Results  Component Value Date   HGBA1C 9.0 (H) 07/28/2023    Patient Goals/Self-Care Activities: Participate in Transition of Care Program/Attend [facility] scheduled calls Notify RN Care Manager of [facility] call rescheduling needs Take all medications as prescribed Attend all scheduled provider appointments Call pharmacy for medication refills 3-7 days in advance of running out of medications Call provider office for new concerns or questions   Follow Up Plan:  Telephone follow up appointment with care management team member scheduled for:  Wednesday January 8, 11:00am         [facility] Outreach team has been closely monitoring the patient since his discharge home on 07/31/23 from his hospitalization due to severe Dehydration. He believes it was due to Ozempic which has now been stopped. He states his appetite has been good and he is eating more. He last three blood sugars where 142, 126 and 177. He is not on medication at this time. Readings sent to the provider. He has a follow up PCP office visit on 08/16/23 to review. RNCM will follow up with patient after the appointment and he will be transitioned back to CCM RN.  Patient educated on red flags s/s to watch for and was encouraged to report any of  these identified, any new symptoms, changes in baseline or medication regimen, change in health status / well-being, or safety concerns to PCP and / or the [facility] Case Management team.   Deidre Ala, RN RN  Care Manager [facility] [phone]

## 2023-08-11 ENCOUNTER — Other Ambulatory Visit: Payer: Self-pay | Admitting: Internal Medicine

## 2023-08-11 DIAGNOSIS — Z85828 Personal history of other malignant neoplasm of skin: Secondary | ICD-10-CM | POA: Diagnosis not present

## 2023-08-11 DIAGNOSIS — K8020 Calculus of gallbladder without cholecystitis without obstruction: Secondary | ICD-10-CM | POA: Diagnosis not present

## 2023-08-11 DIAGNOSIS — Z96643 Presence of artificial hip joint, bilateral: Secondary | ICD-10-CM | POA: Diagnosis not present

## 2023-08-11 DIAGNOSIS — G8929 Other chronic pain: Secondary | ICD-10-CM | POA: Diagnosis not present

## 2023-08-11 DIAGNOSIS — E876 Hypokalemia: Secondary | ICD-10-CM | POA: Diagnosis not present

## 2023-08-11 DIAGNOSIS — M545 Low back pain, unspecified: Secondary | ICD-10-CM | POA: Diagnosis not present

## 2023-08-11 DIAGNOSIS — Z7952 Long term (current) use of systemic steroids: Secondary | ICD-10-CM | POA: Diagnosis not present

## 2023-08-11 DIAGNOSIS — H409 Unspecified glaucoma: Secondary | ICD-10-CM | POA: Diagnosis not present

## 2023-08-11 DIAGNOSIS — D631 Anemia in chronic kidney disease: Secondary | ICD-10-CM | POA: Diagnosis not present

## 2023-08-11 DIAGNOSIS — Z96651 Presence of right artificial knee joint: Secondary | ICD-10-CM | POA: Diagnosis not present

## 2023-08-11 DIAGNOSIS — I130 Hypertensive heart and chronic kidney disease with heart failure and stage 1 through stage 4 chronic kidney disease, or unspecified chronic kidney disease: Secondary | ICD-10-CM | POA: Diagnosis not present

## 2023-08-11 DIAGNOSIS — N183 Chronic kidney disease, stage 3 unspecified: Secondary | ICD-10-CM | POA: Diagnosis not present

## 2023-08-11 DIAGNOSIS — I5032 Chronic diastolic (congestive) heart failure: Secondary | ICD-10-CM | POA: Diagnosis not present

## 2023-08-11 DIAGNOSIS — N179 Acute kidney failure, unspecified: Secondary | ICD-10-CM | POA: Diagnosis not present

## 2023-08-11 DIAGNOSIS — I491 Atrial premature depolarization: Secondary | ICD-10-CM | POA: Diagnosis not present

## 2023-08-11 DIAGNOSIS — E785 Hyperlipidemia, unspecified: Secondary | ICD-10-CM | POA: Diagnosis not present

## 2023-08-11 DIAGNOSIS — I4892 Unspecified atrial flutter: Secondary | ICD-10-CM | POA: Diagnosis not present

## 2023-08-11 DIAGNOSIS — D75839 Thrombocytosis, unspecified: Secondary | ICD-10-CM | POA: Diagnosis not present

## 2023-08-11 DIAGNOSIS — Z8572 Personal history of non-Hodgkin lymphomas: Secondary | ICD-10-CM | POA: Diagnosis not present

## 2023-08-11 DIAGNOSIS — G4733 Obstructive sleep apnea (adult) (pediatric): Secondary | ICD-10-CM | POA: Diagnosis not present

## 2023-08-11 DIAGNOSIS — E1122 Type 2 diabetes mellitus with diabetic chronic kidney disease: Secondary | ICD-10-CM | POA: Diagnosis not present

## 2023-08-11 DIAGNOSIS — I480 Paroxysmal atrial fibrillation: Secondary | ICD-10-CM | POA: Diagnosis not present

## 2023-08-11 MED ORDER — AMLODIPINE BESYLATE 5 MG PO TABS: 5.0000 mg | ORAL_TABLET | Freq: Every day | ORAL | 3 refills | Status: DC

## 2023-08-11 NOTE — Telephone Encounter (Signed)
 Copied from CRM [id number]. Topic: Clinical - Medication Refill >> Aug 11, 2023  4:23 PM Laymon HERO wrote: Most Recent Primary Care Visit:  Provider: JIMMY ADE I  Department: [facility]  Visit Type: OFFICE VISIT  Date: 06/17/2023  Medication: ***  Has the patient contacted their pharmacy?  (Agent: If no, request that the patient contact the pharmacy for the refill. If patient does not wish to contact the pharmacy document the reason why and proceed with request.) (Agent: If yes, when and what did the pharmacy advise?)  Is this the correct pharmacy for this prescription?  If no, delete pharmacy and type the correct one.  This is the patient's preferred pharmacy:  [facility] - [place], [place] - [address] [place] [address] Phone: [phone] Fax: [phone]  [facility]) - [place], [place] - [address] [facility] [address] [place] [address] [place] [address] Phone: [phone] Fax: [phone]  [id number] [place], [place] - [address] [place] [address] Phone: [phone] Fax: [phone]  [facility] - [place], [place] - [address] [place] [address] Phone: [phone] Fax: [phone]   Has the prescription been filled recently?   Is the patient out of the medication?   Has the patient been seen for an appointment in the last year OR does the patient have an upcoming appointment?   Can we respond through MyChart?   Agent: Please be advised that Rx refills may take up to 3 business days. We ask that you follow-up with your pharmacy.

## 2023-08-16 ENCOUNTER — Telehealth: Payer: Self-pay

## 2023-08-16 ENCOUNTER — Other Ambulatory Visit: Payer: Self-pay | Admitting: Internal Medicine

## 2023-08-16 ENCOUNTER — Encounter: Payer: Self-pay | Admitting: Internal Medicine

## 2023-08-16 ENCOUNTER — Ambulatory Visit (INDEPENDENT_AMBULATORY_CARE_PROVIDER_SITE_OTHER): Payer: Medicare Other | Admitting: Internal Medicine

## 2023-08-16 VITALS — BP 138/88 | HR 58 | Temp 98.5°F | Ht 66.0 in | Wt 242.0 lb

## 2023-08-16 DIAGNOSIS — Z7984 Long term (current) use of oral hypoglycemic drugs: Secondary | ICD-10-CM | POA: Diagnosis not present

## 2023-08-16 DIAGNOSIS — E1121 Type 2 diabetes mellitus with diabetic nephropathy: Secondary | ICD-10-CM | POA: Diagnosis not present

## 2023-08-16 DIAGNOSIS — N189 Chronic kidney disease, unspecified: Secondary | ICD-10-CM

## 2023-08-16 DIAGNOSIS — N179 Acute kidney failure, unspecified: Secondary | ICD-10-CM

## 2023-08-16 DIAGNOSIS — R197 Diarrhea, unspecified: Secondary | ICD-10-CM | POA: Diagnosis not present

## 2023-08-16 LAB — RENAL FUNCTION PANEL
Albumin: 3.4 g/dL — ABNORMAL LOW (ref 3.5–5.2)
BUN: 38 mg/dL — ABNORMAL HIGH (ref 6–23)
CO2: 32 meq/L (ref 19–32)
Calcium: 9.2 mg/dL (ref 8.4–10.5)
Chloride: 100 meq/L (ref 96–112)
Creatinine, Ser: 2.6 mg/dL — ABNORMAL HIGH (ref 0.40–1.50)
GFR: 22.84 mL/min — ABNORMAL LOW (ref >60.00)
Glucose, Bld: 196 mg/dL — ABNORMAL HIGH (ref 70–99)
Phosphorus: 3.7 mg/dL (ref 2.3–4.6)
Potassium: 3.7 meq/L (ref 3.5–5.1)
Sodium: 140 meq/L (ref 135–145)

## 2023-08-16 MED ORDER — GLIPIZIDE ER 5 MG PO TB24: 5.0000 mg | ORAL_TABLET | Freq: Every day | ORAL | 3 refills | Status: DC

## 2023-08-16 NOTE — Progress Notes (Signed)
 Subjective:    Patient ID: Daniel Kline, male    DOB: 03-06-1946, 78 y.o.   MRN: [id number]  HPI Here for hospital follow up At [facility] 12/19 to 12/22 Admitted with unrelenting diarrhea--and lost weight Got IV fluids Respiratory panel negative---I don;t see stool study results CT abdomen/pelvis negative for pathology (except for 2.6cm right renal lesion---MRI recommended but he can't have)  Did have GFR go down from 40's to 27  No abdominal pain now--did have pain after first ozempic  dose Then worse after 3 injections Then the diarrhea started Had been on liraglutide  but insurance wouldn't cover it  Checking sugars---155 last time, 167 also--not fasting A1c was 9% upon admission to hospital  Current Outpatient Medications on File Prior to Visit  Medication Sig Dispense Refill   amLODipine  (NORVASC ) 5 MG tablet Take 1 tablet (5 mg total) by mouth daily. 90 tablet 3   bismuth subsalicylate (PEPTO BISMOL) 262 MG/15ML suspension Take 30 mLs by mouth every 6 (six) hours as needed for indigestion or diarrhea or loose stools.     calcitRIOL  (ROCALTROL ) 0.25 MCG capsule Take 0.25 mcg by mouth daily.     calcium  carbonate (OSCAL) 1500 (600 Ca) MG TABS tablet Take 600 mg of elemental calcium  by mouth daily with breakfast.     carvedilol  (COREG ) 3.125 MG tablet TAKE 1 TABLET BY MOUTH 2 TIMES DAILY WITH A MEAL. 180 tablet 3   Cholecalciferol  (VITAMIN D -3) 25 MCG (1000 UT) CAPS Take 1,000 Units by mouth daily.     clorazepate  (TRANXENE ) 7.5 MG tablet TAKE 1 TABLET BY MOUTH TWICE A DAY AS NEEDED FOR ANXIETY 60 tablet 0   cyanocobalamin  (VITAMIN B12) 500 MCG tablet Take 1,000 mcg by mouth daily.     EPINEPHrine  0.3 mg/0.3 mL IJ SOAJ injection Inject 0.3 mg into the muscle as needed for anaphylaxis. 1 each 5   Ferrous Sulfate  (IRON PO) Take 1 tablet by mouth at bedtime.     folic acid  (FOLVITE ) 1 MG tablet Take 1 mg by mouth daily. Every day except day that he takes methotrexate      furosemide   (LASIX ) 40 MG tablet Take 1 tablet (40 mg total) by mouth daily. 30 tablet 0   gabapentin  (NEURONTIN ) 300 MG capsule TAKE 1 CAPSULE BY MOUTH 3 TIMES  DAILY 300 capsule 3   glucose blood (ONETOUCH ULTRA) test strip 1 each by Other route daily. Use to check blood sugar once daily 100 each 3   HYDROcodone -acetaminophen  (NORCO/VICODIN) 5-325 MG tablet TAKE 1 TABLET BY MOUTH EVERY 6 HOURS AS NEEDED FOR PAIN 60 tablet 0   hydrocortisone  2.5 % cream Apply topically 3 (three) times daily as needed. 56 g 3   hydrOXYzine  (ATARAX ) 25 MG tablet TAKE ONE TABLET EVERY EIGHT HOURS AS NEEDED 90 tablet 3   isosorbide  mononitrate (IMDUR ) 60 MG 24 hr tablet Take 1 tablet (60 mg total) by mouth daily before lunch. 90 tablet 3   ketoconazole  (NIZORAL ) 2 % cream Apply 1 Application topically at bedtime.     latanoprost  (XALATAN ) 0.005 % ophthalmic solution Place 1 drop into both eyes at bedtime.     losartan  (COZAAR ) 100 MG tablet TAKE 1 TABLET BY MOUTH DAILY 90 tablet 3   MAGNESIUM  PO Take 1 tablet by mouth daily.     methotrexate  (RHEUMATREX) 2.5 MG tablet Take 5 mg by mouth once a week.     rosuvastatin  (CRESTOR ) 10 MG tablet Take 1 tablet (10 mg total) by mouth daily. 90 tablet  3   spironolactone  (ALDACTONE ) 25 MG tablet Take 25 mg by mouth daily.     timolol  (TIMOPTIC ) 0.5 % ophthalmic solution Place 1 drop into both eyes 2 (two) times daily.     No current facility-administered medications on file prior to visit.    Allergies  Allergen Reactions   Bee Venom Anaphylaxis   Oxycodone  Other (See Comments)    Delusions   Hydromorphone  Other (See Comments)    hallucinating   Hydroxychloroquine     Other reaction(s): Other (See Comments) He broke out really badly.   Zolpidem Other (See Comments)    Past Medical History:  Diagnosis Date   Anemia    H/O   Anxiety    Arthritis    Atrial flutter (HCC)    a. s/p post ablation in 04/2017   Chronic kidney disease    Complication of anesthesia    CTCL  (cutaneous T-cell lymphoma) (HCC)    Diabetes mellitus without complication (HCC)    Diastolic dysfunction    a. 03/2022 Echo: EF 60-65%, no rwma, GrI DD, nl RV fxn.   Dysplastic nevus 12/19/2017   Right distal lat. forearm near wrist. Severe atypia, close to peripheral margin.   Dysplastic nevus 06/21/2018   Upper back right paraspinal. Severe atypia, peripheral margin involved. Excised 07/11/2018, margins free.   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 forearm. SCCis, hypertrophic.    Past Surgical History:  Procedure Laterality Date   BACK SURGERY     LUMBAR   CARDIAC ELECTROPHYSIOLOGY STUDY AND ABLATION  2019   CATARACT EXTRACTION W/PHACO Right 05/05/2022   Procedure: CATARACT EXTRACTION PHACO AND INTRAOCULAR LENS PLACEMENT (IOC) RIGHT;  Surgeon: Mittie Gaskin, MD;  Location: [facility];  Service: Ophthalmology;  Laterality: Right;  Diabetic 10.42 01:13.4   COLONOSCOPY WITH PROPOFOL  N/A 04/04/2019   Procedure: COLONOSCOPY WITH PROPOFOL ;  Surgeon: Toledo, Ladell POUR, MD;  Location: [facility];  Service: Gastroenterology;  Laterality: N/A;   I & D EXTREMITY Right 02/01/2020   Procedure: IRRIGATION AND DEBRIDEMENT EXTREMITY with poly exchange;  Surgeon: Mardee Lynwood SQUIBB, MD;  Location: [facility];  Service: Orthopedics;  Laterality: Right;   INCISION AND DRAINAGE     BACK-MRSA INFECTION AFTER BACK SURGERY   KNEE ARTHROPLASTY Right 01/28/2020   Procedure: COMPUTER ASSISTED TOTAL KNEE ARTHROPLASTY;   Surgeon: Mardee Lynwood SQUIBB, MD;  Location: [facility];  Service: Orthopedics;  Laterality: Right;   MOUTH SURGERY     right elbow surgery     right knee surgery     right shoulder surgery     from polio damage   TONSILLECTOMY     TOTAL HIP ARTHROPLASTY Bilateral 04/2016    Family History  Adopted: Yes    Social History   Socioeconomic History   Marital status: Married    Spouse name: Not on file   Number of children: 0   Years of education: Not on  file   Highest education level: Not on file  Occupational History   Occupation: Corporate investment banker    Comment: 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   Occupation: Engineering geologist    Comment: Retired   Occupation: Scientist, research (medical)    Comment: Retired  Tobacco Use   Smoking status: Former    Types: Cigars    Quit date: 09/23/1990    Years since quitting: 32.9    Passive exposure: Past (as a child)   Smokeless tobacco: Former    Quit date: 02/25/2006  Vaping Use   Vaping status: Never Used  Substance and Sexual Activity   Alcohol use: No    Alcohol/week: 0.0 standard drinks of alcohol   Drug use: No   Sexual activity: Yes    Partners: Female  Other Topics Concern   Not on file  Social History Narrative   Has living will   Wife is health care POA---then brother or sister   Would accept resuscitation attempts but no prolonged ventilation or tube feeds   Social Drivers of Health   Financial Resource Strain: Low Risk  (07/20/2017)   Overall Financial Resource Strain (CARDIA)    Difficulty of Paying Living Expenses: Not hard at all  Food Insecurity: No Food Insecurity (08/01/2023)   Hunger Vital Sign    Worried About Running Out of Food in the Last Year: Never true    Ran Out of Food in the Last Year: Never true  Transportation Needs: No Transportation Needs (08/01/2023)   PRAPARE - Administrator, Civil Service (Medical): No    Lack of Transportation (Non-Medical): No  Physical Activity: Not on  file  Stress: Not on file  Social Connections: Unknown (07/20/2017)   Social Connection and Isolation Panel [NHANES]    Frequency of Communication with Friends and Family: Patient declined    Frequency of Social Gatherings with Friends and Family: Patient declined    Attends Religious Services: Patient declined    Database Administrator or Organizations: Patient declined    Attends Banker Meetings: Patient declined    Marital Status: Patient declined  Intimate Partner Violence: Not At Risk (08/01/2023)   Humiliation, Afraid, Rape, and Kick questionnaire    Fear of Current or Ex-Partner: No    Emotionally Abused: No    Physically Abused: No    Sexually Abused: No   Review of Systems No cough or SOB No fever     Objective:   Physical Exam Constitutional:      Appearance: Normal appearance.  Cardiovascular:     Rate and Rhythm: Normal rate and regular rhythm.     Heart sounds: No murmur heard.    No gallop.  Pulmonary:     Effort: Pulmonary effort is normal.     Breath sounds: Normal breath sounds. No wheezing or rales.  Abdominal:     General: Bowel sounds are normal.     Comments: Firm but non tender Small umbilical hernia  Musculoskeletal:     Cervical back: Neck supple.     Comments: Stable edema without pitting  Lymphadenopathy:     Cervical: No cervical adenopathy.  Neurological:     Mental Status: He is alert.            Assessment & Plan:

## 2023-08-16 NOTE — Assessment & Plan Note (Signed)
 Seemed to be related to change to ozempic Doing better now Will avoid GLP-1 agonists

## 2023-08-16 NOTE — Telephone Encounter (Signed)
 Copied from CRM [id number]. Topic: Clinical - Home Health Verbal Orders >> Aug 16, 2023 12:28 PM Pinkey ORN wrote: Caller/Agency: [facility]  Callback Number:  [phone]  Service Requested: Physical Therapy. Nurse evaluation for medication management... Frequency:  Any new concerns about the patient? No >> Aug 16, 2023 12:31 PM Pinkey ORN wrote: Physical Therapy. 1x week 1, 2x week 4, 1x week 4   Nurse evaluation for medication management.SABRASABRA

## 2023-08-16 NOTE — Assessment & Plan Note (Signed)
 Should be improved with the IV fluids now and no more diarrhea Will recheck today

## 2023-08-16 NOTE — Telephone Encounter (Signed)
Verbal orders given to Angie.

## 2023-08-16 NOTE — Patient Instructions (Signed)
 Stay off the victoza and the ozempic. Start the glipizide daily with breakfast---let me know if you have any trouble with low sugars.

## 2023-08-16 NOTE — Assessment & Plan Note (Signed)
 Had problems with victoza when dose pushed---then insurance made a change to ozempic (which went very poorly) Would avoid metformin/jardiance with marginal renal function Will try glipizide 5mg  --discussed monitoring for hypoglycemia

## 2023-08-17 ENCOUNTER — Telehealth: Payer: Self-pay

## 2023-08-17 ENCOUNTER — Other Ambulatory Visit: Payer: Self-pay

## 2023-08-17 DIAGNOSIS — I5032 Chronic diastolic (congestive) heart failure: Secondary | ICD-10-CM | POA: Diagnosis not present

## 2023-08-17 DIAGNOSIS — Z96651 Presence of right artificial knee joint: Secondary | ICD-10-CM | POA: Diagnosis not present

## 2023-08-17 DIAGNOSIS — E876 Hypokalemia: Secondary | ICD-10-CM | POA: Diagnosis not present

## 2023-08-17 DIAGNOSIS — Z7952 Long term (current) use of systemic steroids: Secondary | ICD-10-CM | POA: Diagnosis not present

## 2023-08-17 DIAGNOSIS — G4733 Obstructive sleep apnea (adult) (pediatric): Secondary | ICD-10-CM | POA: Diagnosis not present

## 2023-08-17 DIAGNOSIS — N183 Chronic kidney disease, stage 3 unspecified: Secondary | ICD-10-CM | POA: Diagnosis not present

## 2023-08-17 DIAGNOSIS — E785 Hyperlipidemia, unspecified: Secondary | ICD-10-CM | POA: Diagnosis not present

## 2023-08-17 DIAGNOSIS — M545 Low back pain, unspecified: Secondary | ICD-10-CM | POA: Diagnosis not present

## 2023-08-17 DIAGNOSIS — K8020 Calculus of gallbladder without cholecystitis without obstruction: Secondary | ICD-10-CM | POA: Diagnosis not present

## 2023-08-17 DIAGNOSIS — G8929 Other chronic pain: Secondary | ICD-10-CM | POA: Diagnosis not present

## 2023-08-17 DIAGNOSIS — E1122 Type 2 diabetes mellitus with diabetic chronic kidney disease: Secondary | ICD-10-CM | POA: Diagnosis not present

## 2023-08-17 DIAGNOSIS — D75839 Thrombocytosis, unspecified: Secondary | ICD-10-CM | POA: Diagnosis not present

## 2023-08-17 DIAGNOSIS — I4892 Unspecified atrial flutter: Secondary | ICD-10-CM | POA: Diagnosis not present

## 2023-08-17 DIAGNOSIS — D631 Anemia in chronic kidney disease: Secondary | ICD-10-CM | POA: Diagnosis not present

## 2023-08-17 DIAGNOSIS — I491 Atrial premature depolarization: Secondary | ICD-10-CM | POA: Diagnosis not present

## 2023-08-17 DIAGNOSIS — N179 Acute kidney failure, unspecified: Secondary | ICD-10-CM | POA: Diagnosis not present

## 2023-08-17 DIAGNOSIS — H409 Unspecified glaucoma: Secondary | ICD-10-CM | POA: Diagnosis not present

## 2023-08-17 DIAGNOSIS — I480 Paroxysmal atrial fibrillation: Secondary | ICD-10-CM | POA: Diagnosis not present

## 2023-08-17 DIAGNOSIS — I130 Hypertensive heart and chronic kidney disease with heart failure and stage 1 through stage 4 chronic kidney disease, or unspecified chronic kidney disease: Secondary | ICD-10-CM | POA: Diagnosis not present

## 2023-08-17 DIAGNOSIS — Z8572 Personal history of non-Hodgkin lymphomas: Secondary | ICD-10-CM | POA: Diagnosis not present

## 2023-08-17 DIAGNOSIS — Z96643 Presence of artificial hip joint, bilateral: Secondary | ICD-10-CM | POA: Diagnosis not present

## 2023-08-17 DIAGNOSIS — Z85828 Personal history of other malignant neoplasm of skin: Secondary | ICD-10-CM | POA: Diagnosis not present

## 2023-08-17 NOTE — Telephone Encounter (Addendum)
 While on the phone with patient line was disconnected. Tried calling back and left a voice message asking to give the office a call back.  ----- Message from Charlie Denise sent at 08/17/2023 10:21 AM EST ----- Please call patient The kidney function is back to his previous baseline. Let him know I am sending a copy to Dr Douglas

## 2023-08-17 NOTE — Patient Instructions (Signed)
 Visit Information  Thank you for taking time to visit with me today. Please don't hesitate to contact me if I can be of assistance to you before our next scheduled telephone appointment.  Following is a copy of your care plan:   Goals Addressed             This Visit's Progress    COMPLETED: Patient Stated       Current Barriers:  Knowledge Deficits related to plan of care for management of DMII  Chronic Disease Management support and education needs related to DMII   RNCM Clinical Goal(s):  Patient will work with the Care Management team over the next 30 days to address Transition of Care Barriers: Medication Management Diet/Nutrition/Food Resources Support at home Provider appointments Home Health services Equipment/DME take all medications exactly as prescribed and will call provider for medication related questions as evidenced by No hospital admission  through collaboration with RN Care manager, provider, and care team.   Interventions: Evaluation of current treatment plan related to  self management and patient's adherence to plan as established by provider   Diabetes Interventions:  (Status:  New goal.) Short Term Goal Assessed patient's understanding of [name]: <7% Provided education to patient about basic DM disease process Reviewed medications with patient and discussed importance of medication adherence Discussed plans with patient for ongoing care management follow up and provided patient with direct contact information for care management team Reviewed scheduled/upcoming provider appointments including: PCP 08/16/23 Lab Results  Component Value Date   HGBA1C 9.0 (H) 07/28/2023    Patient Goals/Self-Care Activities: Participate in Transition of Care Program/Attend [facility] scheduled calls Notify RN Care Manager of [facility] call rescheduling needs Take all medications as prescribed Attend all scheduled provider appointments Call pharmacy for medication refills 3-7 days  in advance of running out of medications Call provider office for new concerns or questions   Follow Up Plan:  Telephone follow up appointment with care management team member scheduled for:  Wednesday January 8, 11:00am          The patient verbalized understanding of instructions, educational materials, and care plan provided today and agreed to receive a mailed copy of patient instructions, educational materials, and care plan.   Please call the care guide team at [phone] if you need to cancel or reschedule your appointment.   Please call the Suicide and Crisis Lifeline: 988 call the [place]  National Suicide Prevention Lifeline: 872-121-7264 or TTY: (816) 352-5517 TTY (415) 067-7104) to talk to a trained counselor if you are experiencing a Mental Health or Behavioral Health Crisis or need someone to talk to.  Medford Balboa, RN Medical Illustrator [facility] [phone]

## 2023-08-17 NOTE — Patient Outreach (Signed)
 Care Management  Transitions of Care Program Transitions of Care Post-discharge week 4   08/17/2023 Name: Daniel Kline MRN: [id number] DOB: [birth year]  Subjective: Daniel Kline is a 78 y.o. year old male who is a primary care patient of [name], MD. The Care Management team Engaged with patient Engaged with patient by telephone to assess and address transitions of care needs.   Consent to Services:  Patient was given information about care management services, agreed to services, and gave verbal consent to participate.   Assessment: TOC Outreach completed today. The patient went to see his PCP yesterday 08/16/23 and he was started on Glipizide  5mg  daily. His Blood glucose was 196. He states he has not taken his blood sugar today or the medication but he will. He states HHPT and SN are scheduled to come out and see him today. He isn't sure he needs PT and he is trying to increase his exercise on his own. He has a stationary bicycle that he is going to ride. Educated on starting slowly and building endurance. Discussed DM diet and food choices. The patient could benefit from some continuing education regarding carbs and sugary foods. TOC 30 day Outreach completed today. He will be transferred back to CCM team.          SDOH Interventions    Flowsheet Row Patient Outreach from 08/17/2023 in [place] [facility] Telephone from 08/01/2023 in [place] [facility] Care Coordination from 10/21/2022 in [facility] Care Coordination  SDOH Interventions     Food Insecurity Interventions Intervention Not Indicated Intervention Not Indicated Intervention Not Indicated  Housing Interventions Intervention Not Indicated Intervention Not Indicated Intervention Not Indicated  Transportation Interventions Intervention Not Indicated Intervention Not Indicated Intervention Not Indicated  Utilities Interventions Intervention Not Indicated  Intervention Not Indicated --  Social Connections Interventions Intervention Not Indicated -- --        Goals Addressed             This Visit's Progress    COMPLETED: Patient Stated       Current Barriers:  Knowledge Deficits related to plan of care for management of DMII  Chronic Disease Management support and education needs related to DMII   RNCM Clinical Goal(s):  Patient will work with the Care Management team over the next 30 days to address Transition of Care Barriers: Medication Management Diet/Nutrition/Food Resources Support at home Provider appointments Home Health services Equipment/DME take all medications exactly as prescribed and will call provider for medication related questions as evidenced by No hospital admission  through collaboration with RN Care manager, provider, and care team.   Interventions: Evaluation of current treatment plan related to  self management and patient's adherence to plan as established by provider   Diabetes Interventions:  (Status:  New goal.) Short Term Goal Assessed patient's understanding of [name]: <7% Provided education to patient about basic DM disease process Reviewed medications with patient and discussed importance of medication adherence Discussed plans with patient for ongoing care management follow up and provided patient with direct contact information for care management team Reviewed scheduled/upcoming provider appointments including: PCP 08/16/23 Lab Results  Component Value Date   HGBA1C 9.0 (H) 07/28/2023    Patient Goals/Self-Care Activities: Participate in Transition of Care Program/Attend [facility] scheduled calls Notify RN Care Manager of TOC call rescheduling needs Take all medications as prescribed Attend all scheduled provider appointments Call pharmacy for medication refills 3-7 days in advance of  running out of medications Call provider office for new concerns or questions   Follow Up Plan:  Telephone  follow up appointment with care management team member scheduled for:  Wednesday January 8, 11:00am         Please refer to Care Plan for goals and interventions.  Patient educated on red flags signs/symptoms to watch for and was encouraged to report any of these identified, any new symptoms, changes in baseline or medication regimen, change in health status / well-being, or safety concerns to PCP and / or the [facility] Case Management team.   The patient has been provided with contact information for the care management team and has been advised to call with any health-related questions or concerns. The patient verbalized understanding with current POC. The patient is directed to their insurance card regarding availability of benefits coverage.   Medford Balboa, RN Medical Illustrator [facility] [phone]

## 2023-08-18 DIAGNOSIS — I130 Hypertensive heart and chronic kidney disease with heart failure and stage 1 through stage 4 chronic kidney disease, or unspecified chronic kidney disease: Secondary | ICD-10-CM | POA: Diagnosis not present

## 2023-08-18 DIAGNOSIS — G4733 Obstructive sleep apnea (adult) (pediatric): Secondary | ICD-10-CM | POA: Diagnosis not present

## 2023-08-18 DIAGNOSIS — M545 Low back pain, unspecified: Secondary | ICD-10-CM | POA: Diagnosis not present

## 2023-08-18 DIAGNOSIS — D631 Anemia in chronic kidney disease: Secondary | ICD-10-CM | POA: Diagnosis not present

## 2023-08-18 DIAGNOSIS — I480 Paroxysmal atrial fibrillation: Secondary | ICD-10-CM | POA: Diagnosis not present

## 2023-08-18 DIAGNOSIS — I4892 Unspecified atrial flutter: Secondary | ICD-10-CM | POA: Diagnosis not present

## 2023-08-18 DIAGNOSIS — N183 Chronic kidney disease, stage 3 unspecified: Secondary | ICD-10-CM | POA: Diagnosis not present

## 2023-08-18 DIAGNOSIS — G8929 Other chronic pain: Secondary | ICD-10-CM | POA: Diagnosis not present

## 2023-08-18 DIAGNOSIS — E785 Hyperlipidemia, unspecified: Secondary | ICD-10-CM | POA: Diagnosis not present

## 2023-08-18 DIAGNOSIS — H409 Unspecified glaucoma: Secondary | ICD-10-CM | POA: Diagnosis not present

## 2023-08-18 DIAGNOSIS — I5032 Chronic diastolic (congestive) heart failure: Secondary | ICD-10-CM | POA: Diagnosis not present

## 2023-08-18 DIAGNOSIS — E1122 Type 2 diabetes mellitus with diabetic chronic kidney disease: Secondary | ICD-10-CM | POA: Diagnosis not present

## 2023-08-19 ENCOUNTER — Ambulatory Visit: Payer: Self-pay

## 2023-08-19 NOTE — Patient Instructions (Signed)
 Visit Information  Thank you for taking time to visit with me today. Please don't hesitate to contact me if I can be of assistance to you.   Following are the goals we discussed today:   Goals Addressed             This Visit's Progress    Management of health conditions       Interventions Today    Flowsheet Row Most Recent Value  Chronic Disease   Chronic disease during today's visit Diabetes  General Interventions   General Interventions Discussed/Reviewed General Interventions Reviewed, Doctor Visits  [evaluation of current treatment plan for diabetes/ diarrhea and patients adherence to plan as established by provider. Assessed for Blood sugar readings and/ or ongoing diarrhea.]  Doctor Visits Discussed/Reviewed Doctor Visits Reviewed  [facility] upcoming provider visit. Confirmed patient aware of upcoming physical with primary care provider.]  Education Interventions   Education Provided Provided Education, Provided Printed Education  [Reviewed Rule of 15 for hypoglycemia treatment.  Education article mailed to patient on hypoglycemia treatment and blood sugar monitoring. Discussed importance off patient monitoring blood sugars daily.  Can monitor fasting or 1-2 hrs after a meal.]  Provided Verbal Education On Blood Sugar Monitoring, Other  [Discussed importance of monitoring blood sugars daily.  Reinforced provider recommendation to notify provider for frequent low blood sugars <70. Advised to notify provider for ongoing issues with diarrhea or any new/ ongoing symptoms.]  Nutrition Interventions   Nutrition Discussed/Reviewed Nutrition Reviewed  [Assessed patients appetite.  Advised to eat carbohydrated controlled  meals, watch portion sizes and avoid sugar sweetened drinks.]  Pharmacy Interventions   Pharmacy Dicussed/Reviewed Pharmacy Topics Reviewed  [medications reviewed and compliance discussed.  Confirmed patient not taking Ozempic  or Victoza .  Advised to take glipizide  as  recommended by provider.]               Our next appointment is by telephone on 09/13/23 at 2 pm  Please call the care guide team at [phone] if you need to cancel or reschedule your appointment.   If you are experiencing a Mental Health or Behavioral Health Crisis or need someone to talk to, please call the Suicide and Crisis Lifeline: 988 call 1-800-273-TALK (toll free, 24 hour hotline)  The patient verbalized understanding of instructions, educational materials, and care plan provided today and agreed to receive a mailed copy of patient instructions, educational materials, and care plan.   Arvin Seip RN,BSN,CCM [facility], [facility] coordinator / Case Manager Phone: [phone]  Hypoglycemia Hypoglycemia is when the sugar (glucose) level in your blood is too low. Low blood sugar can happen to people who have diabetes and people who do not have diabetes. Low blood sugar can happen quickly, and it can be an emergency. What are the causes? This condition happens most often in people who have diabetes. It may be caused by: Diabetes medicine. Not eating enough, or not eating often enough. Doing more physical activity. Drinking alcohol on an empty stomach. If you do not have diabetes, this condition may be caused by: A tumor in the pancreas. Not eating enough, or not eating for long periods at a time (fasting). A very bad infection or illness. Problems after having weight loss (bariatric) surgery. Kidney failure or liver failure. Certain medicines. What increases the risk? This condition is more likely to develop in people who: Have diabetes and take medicines to lower their blood sugar. Abuse alcohol. Have a very bad illness. What  are the signs or symptoms? Mild Hunger. Sweating and feeling clammy. Feeling dizzy or light-headed. Being sleepy or having trouble sleeping. Feeling like you may vomit (nauseous). A fast  heartbeat. A headache. Blurry vision. Mood changes, such as: Being grouchy. Feeling worried or nervous (anxious). Tingling or loss of feeling (numbness) around your mouth, lips, or tongue. Moderate Confusion and poor judgment. Behavior changes. Weakness. Uneven heartbeat. Trouble with moving (coordination). Very low Very low blood sugar (severe hypoglycemia) is a medical emergency. It can cause: Fainting. Seizures. Loss of consciousness (coma). Death. How is this treated? Treating low blood sugar Low blood sugar is often treated by eating or drinking something that has sugar in it right away. The food or drink should contain 15 grams of a fast-acting carb (carbohydrate). Options include: 4 oz (120 mL) of fruit juice. 4 oz (120 mL) of regular soda (not diet soda). A few pieces of hard candy. Check food labels to see how many pieces to eat for 15 grams. 1 Tbsp (15 mL) of sugar or honey. 4 glucose tablets. 1 tube of glucose gel. Treating low blood sugar if you have diabetes If you can think clearly and swallow safely, follow the 15:15 rule: Take 15 grams of a fast-acting carb. Talk with your doctor about how much you should take. Always keep a source of fast-acting carb with you, such as: Glucose tablets (take 4 tablets). A few pieces of hard candy. Check food labels to see how many pieces to eat for 15 grams. 4 oz (120 mL) of fruit juice. 4 oz (120 mL) of regular soda (not diet soda). 1 Tbsp (15 mL) of honey or sugar. 1 tube of glucose gel. Check your blood sugar 15 minutes after you take the carb. If your blood sugar is still at or below 70 mg/dL (3.9 mmol/L), take 15 grams of a carb again. If your blood sugar does not go above 70 mg/dL (3.9 mmol/L) after 3 tries, get help right away. After your blood sugar goes back to normal, eat a meal or a snack within 1 hour.  Treating very low blood sugar If your blood sugar is below 54 mg/dL (3 mmol/L), you have very low blood sugar,  or severe hypoglycemia. This is an emergency. Get medical help right away. If you have very low blood sugar and you cannot eat or drink, you will need to be given a hormone called glucagon. A family member or friend should learn how to check your blood sugar and how to give you glucagon. Ask your doctor if you need to have an emergency glucagon kit at home. Very low blood sugar may also need to be treated in a hospital. Follow these instructions at home: General instructions Take over-the-counter and prescription medicines only as told by your doctor. Stay aware of your blood sugar as told by your doctor. If you drink alcohol: Limit how much you have to: 0-1 drink a day for women who are not pregnant. 0-2 drinks a day for men. Know how much alcohol is in your drink. In the [place] one drink equals one 12 oz bottle of beer (355 mL), one 5 oz glass of wine (148 mL), or one 1 oz glass of hard liquor (44 mL). Be sure to eat food when you drink alcohol. Know that your body absorbs alcohol quickly. This may lead to low blood sugar later. Be sure to keep checking your blood sugar. Keep all follow-up visits. If you have diabetes:  Always have a fast-acting  carb (15 grams) with you to treat low blood sugar. Follow your diabetes care plan as told by your doctor. Make sure you: Know the symptoms of low blood sugar. Check your blood sugar as often as told. Always check it before and after exercise. Always check your blood sugar before you drive. Take your medicines as told. Follow your meal plan. Eat on time. Do not skip meals. Share your diabetes care plan with: Your work or school. People you live with. Carry a card or wear jewelry that says you have diabetes. Where to find more information American Diabetes Association: www.diabetes.org Contact a doctor if: You have trouble keeping your blood sugar in your target range. You have low blood sugar often. Get help right away if: You still have  symptoms after you eat or drink something that contains 15 grams of fast-acting carb, and you cannot get your blood sugar above 70 mg/dL by following the 84:84 rule. Your blood sugar is below 54 mg/dL (3 mmol/L). You have a seizure. You faint. These symptoms may be an emergency. Get help right away. Call your local emergency services (911 in the [place] Do not wait to see if the symptoms will go away. Do not drive yourself to the hospital. Summary Hypoglycemia happens when the level of sugar (glucose) in your blood is too low. Low blood sugar can happen to people who have diabetes and people who do not have diabetes. Low blood sugar can happen quickly, and it can be an emergency. Make sure you know the symptoms of low blood sugar and know how to treat it. Always keep a source of sugar (fast-acting carb) with you to treat low blood sugar. This information is not intended to replace advice given to you by your health care provider. Make sure you discuss any questions you have with your health care provider. Document Revised: 06/26/2020 Document Reviewed: 06/26/2020 Elsevier Patient Education  2024 Elsevier Inc.  Blood Glucose Monitoring, Adult To manage your diabetes, you will need to keep track of your blood sugar (glucose). Check your blood glucose as often as told. Keep a record of your results over time. This can help you: Know when to adjust your diabetes management plan with your health care provider. See how food, exercise, illness, and medicines affect your blood glucose. Know what your blood glucose is at any time. Your provider will set specific goals for your blood glucose levels. In many cases, these goals may be: Before meals (preprandial): 80-130 mg/dL (4.4-7.2 mmol/L). After meals (postprandial): below 180 mg/dL (10 mmol/L). A1C level: less than 7%. Supplies needed: Blood glucose meter. Test strips for your meter. Each meter has its own strips. You must use the strips that  came with your meter. A needle to prick your finger (lancet). Do not use a lancet more than once. A device that holds the lancet (lancing device). A journal or logbook to write down your results. How to check your blood glucose Checking your blood glucose  Wash your hands with soap and water for at least 20 seconds. Prick the side of your finger with the lancet. Do not prick the tip of your finger. Do not use the same finger more than once. Gently rub the finger until a small drop of blood appears. Follow the instructions that came with the meter about how to insert the test strip, apply blood to the strip, and use the meter. Write down your result and any notes. Using alternative sites Some meters let you use  other areas of your body (alternative sites) to test your blood. The most common places are the forearm, the thigh, and the palm of your hand. Alternative sites may not be as accurate as your fingers. The result you get may also be delayed. Use the finger only, and do not use alternative sites, if: You think you have low blood glucose (hypoglycemia). You sometimes do not know that your blood glucose is getting low (hypoglycemia unawareness). General tips and recommendations Blood glucose log  Write down the result each time you check your blood glucose. Note anything that may be affecting your blood glucose. This can help you and your provider: Look for patterns over time. Adjust your management plan as needed. Check if your meter has an app or lets you download your records to a computer. Most meters keep a record of glucose readings in the meter. If you have type 1 diabetes: You may need to check your blood glucose 4 or more times a day. Check your blood glucose as often as told by your provider. This may include: Before each meal and snack. Two hours after a meal. Before bedtime. If you have symptoms of hypoglycemia. After treating your hypoglycemia. Before doing things that  have a risk of injury, such as driving or using machinery. Before and after exercise. Between 2:00 a.m. and 3:00 a.m., as told. You may need to check your blood glucose more often, such as up to 6-10 times a day, if: You have diabetes that is not well controlled. You are ill. You have a history of severe hypoglycemia. You have hypoglycemia unawareness. If you have type 2 diabetes: You may need to check your blood glucose 2 or more times a day. Check your blood glucose as often as told by your provider. This may include: Before and after exercise. Before doing things that have a risk of injury, such as driving or using machinery. You may need to check your blood glucose more often if: Your medicine is being adjusted. Your diabetes is not well controlled. You are ill. General tips Make sure you always have your supplies with you. After you use a few boxes of test strips, adjust (calibrate) your blood glucose meter. Follow the instructions that came with your meter. If you have questions or need help, all blood glucose meters have a 24-hour hotline phone number that you can call. Also contact your provider with any questions or concerns. Where to find more information The American Diabetes Association: diabetes.org The Association of Diabetes Care & Education Specialists: diabeteseducator.org Contact a health care provider if: Your blood glucose is at or above 240 mg/dL (86.6 mmol/L) for 2 days in a row. You have been sick or have had a fever for 2 days or longer and are not getting better. You have any of these problems for more than 6 hours: You cannot eat or drink. You have nausea or vomiting. You have diarrhea. Get help right away if: Your blood glucose is lower than 54 mg/dL (3 mmol/L). You become confused, or you have trouble thinking clearly. You have trouble breathing. You have moderate to high ketone levels in your pee (urine). These symptoms may be an emergency. Get help  right away. Call 911. Do not wait to see if the symptoms will go away. Do not drive yourself to the hospital. This information is not intended to replace advice given to you by your health care provider. Make sure you discuss any questions you have with your health care  provider. Document Revised: 06/11/2022 Document Reviewed: 06/11/2022 Elsevier Patient Education  2024 [employer].

## 2023-08-19 NOTE — Patient Outreach (Signed)
  Care Coordination   Follow Up Visit Note   08/19/2023 Name: Daniel Kline MRN: [id number] DOB: 1946/03/28  Daniel Kline is a 78 y.o. year old male who sees Daniel Charlie FERNS, MD for primary care. I spoke with  Daniel Kline by phone today.  What matters to the patients health and wellness today?  Per chart review patient had hospital admission from 07/27/24 to 07/30/24 for diarrhea.  Patient states he is doing much better and states the diarrhea has resolved.   Patient reports having a hospital follow up viit with his primary provider on 08/16/23.  He states the diarrhea issue was more than likely from the Ozempic  and Victoza  per his provider.  Patient states he is taking the glipizide  as prescribed.  Patient states he did not check his blood sugar today because he is just waking up.  Patient only able to provide 1 blood sugar reading from yesterday of 205.  Patient asked,  when is the best time to check my blood sugar.  Patient was advised accordingly and mailed education information on blood sugar monitoring as well as hypoglycemia treatment. Patient states his appetite has improved. He states he is also receiving PT services with [facility].    Goals Addressed             This Visit's Progress    Management of health conditions       Interventions Today    Flowsheet Row Most Recent Value  Chronic Disease   Chronic disease during today's visit Diabetes  General Interventions   General Interventions Discussed/Reviewed General Interventions Reviewed, Doctor Visits  [evaluation of current treatment plan for diabetes/ diarrhea and patients adherence to plan as established by provider. Assessed for Blood sugar readings and/ or ongoing diarrhea.]  Doctor Visits Discussed/Reviewed Doctor Visits Reviewed  [facility] upcoming provider visit. Confirmed patient aware of upcoming physical with primary care provider.]  Education Interventions   Education Provided Provided  Education, Provided Printed Education  [Reviewed Rule of 15 for hypoglycemia treatment.  Education article mailed to patient on hypoglycemia treatment and blood sugar monitoring. Discussed importance off patient monitoring blood sugars daily.  Can monitor fasting or 1-2 hrs after a meal.]  Provided Verbal Education On Blood Sugar Monitoring, Other  [Discussed importance of monitoring blood sugars daily.  Reinforced provider recommendation to notify provider for frequent low blood sugars <70. Advised to notify provider for ongoing issues with diarrhea or any new/ ongoing symptoms.]  Nutrition Interventions   Nutrition Discussed/Reviewed Nutrition Reviewed  [Assessed patients appetite.  Advised to eat carbohydrated controlled  meals, watch portion sizes and avoid sugar sweetened drinks.]  Pharmacy Interventions   Pharmacy Dicussed/Reviewed Pharmacy Topics Reviewed  [medications reviewed and compliance discussed.  Confirmed patient not taking Ozempic  or Victoza .  Advised to take glipizide  as recommended by provider.]               SDOH assessments and interventions completed:  No     Care Coordination Interventions:  Yes, provided   Follow up plan: Follow up call scheduled for 09/13/23 at 2 pm.    Encounter Outcome:  Patient Visit Completed   Korver Graybeal RN,BSN,CCM [facility]  [facility], [facility] coordinator / Case Manager Phone: [phone]

## 2023-08-22 ENCOUNTER — Telehealth: Payer: Self-pay

## 2023-08-22 DIAGNOSIS — E1122 Type 2 diabetes mellitus with diabetic chronic kidney disease: Secondary | ICD-10-CM | POA: Diagnosis not present

## 2023-08-22 DIAGNOSIS — M545 Low back pain, unspecified: Secondary | ICD-10-CM | POA: Diagnosis not present

## 2023-08-22 DIAGNOSIS — I4892 Unspecified atrial flutter: Secondary | ICD-10-CM | POA: Diagnosis not present

## 2023-08-22 DIAGNOSIS — K8020 Calculus of gallbladder without cholecystitis without obstruction: Secondary | ICD-10-CM | POA: Diagnosis not present

## 2023-08-22 DIAGNOSIS — G8929 Other chronic pain: Secondary | ICD-10-CM | POA: Diagnosis not present

## 2023-08-22 DIAGNOSIS — G4733 Obstructive sleep apnea (adult) (pediatric): Secondary | ICD-10-CM | POA: Diagnosis not present

## 2023-08-22 DIAGNOSIS — D631 Anemia in chronic kidney disease: Secondary | ICD-10-CM | POA: Diagnosis not present

## 2023-08-22 DIAGNOSIS — I480 Paroxysmal atrial fibrillation: Secondary | ICD-10-CM | POA: Diagnosis not present

## 2023-08-22 DIAGNOSIS — Z8572 Personal history of non-Hodgkin lymphomas: Secondary | ICD-10-CM | POA: Diagnosis not present

## 2023-08-22 DIAGNOSIS — Z7952 Long term (current) use of systemic steroids: Secondary | ICD-10-CM | POA: Diagnosis not present

## 2023-08-22 DIAGNOSIS — H409 Unspecified glaucoma: Secondary | ICD-10-CM | POA: Diagnosis not present

## 2023-08-22 DIAGNOSIS — N179 Acute kidney failure, unspecified: Secondary | ICD-10-CM | POA: Diagnosis not present

## 2023-08-22 DIAGNOSIS — Z85828 Personal history of other malignant neoplasm of skin: Secondary | ICD-10-CM | POA: Diagnosis not present

## 2023-08-22 DIAGNOSIS — I130 Hypertensive heart and chronic kidney disease with heart failure and stage 1 through stage 4 chronic kidney disease, or unspecified chronic kidney disease: Secondary | ICD-10-CM | POA: Diagnosis not present

## 2023-08-22 DIAGNOSIS — I491 Atrial premature depolarization: Secondary | ICD-10-CM | POA: Diagnosis not present

## 2023-08-22 DIAGNOSIS — E785 Hyperlipidemia, unspecified: Secondary | ICD-10-CM | POA: Diagnosis not present

## 2023-08-22 DIAGNOSIS — E876 Hypokalemia: Secondary | ICD-10-CM | POA: Diagnosis not present

## 2023-08-22 DIAGNOSIS — N183 Chronic kidney disease, stage 3 unspecified: Secondary | ICD-10-CM | POA: Diagnosis not present

## 2023-08-22 DIAGNOSIS — Z96651 Presence of right artificial knee joint: Secondary | ICD-10-CM | POA: Diagnosis not present

## 2023-08-22 DIAGNOSIS — I5032 Chronic diastolic (congestive) heart failure: Secondary | ICD-10-CM | POA: Diagnosis not present

## 2023-08-22 DIAGNOSIS — D75839 Thrombocytosis, unspecified: Secondary | ICD-10-CM | POA: Diagnosis not present

## 2023-08-22 DIAGNOSIS — Z96643 Presence of artificial hip joint, bilateral: Secondary | ICD-10-CM | POA: Diagnosis not present

## 2023-08-22 NOTE — Telephone Encounter (Signed)
 Copied from CRM [id number]. Topic: Clinical - Medical Advice >> Aug 22, 2023 12:57 PM Isabell A wrote: Reason for CRM: Patient was told to give Dr.Letvak his readings on his blood sugar.  First day he was using it wrong.  Second day 205, states his blood sugar dropped to 125 on 1/10.  Saturday 1/11 125. Sunday 1/12 125.  Monday 1/13 172.

## 2023-08-22 NOTE — Telephone Encounter (Signed)
Spoke to pt's wife per DPR. ?

## 2023-08-23 DIAGNOSIS — E1122 Type 2 diabetes mellitus with diabetic chronic kidney disease: Secondary | ICD-10-CM | POA: Diagnosis not present

## 2023-08-23 DIAGNOSIS — N2581 Secondary hyperparathyroidism of renal origin: Secondary | ICD-10-CM | POA: Diagnosis not present

## 2023-08-23 DIAGNOSIS — E785 Hyperlipidemia, unspecified: Secondary | ICD-10-CM | POA: Diagnosis not present

## 2023-08-23 DIAGNOSIS — N184 Chronic kidney disease, stage 4 (severe): Secondary | ICD-10-CM | POA: Diagnosis not present

## 2023-08-23 DIAGNOSIS — R809 Proteinuria, unspecified: Secondary | ICD-10-CM | POA: Diagnosis not present

## 2023-08-23 DIAGNOSIS — D631 Anemia in chronic kidney disease: Secondary | ICD-10-CM | POA: Diagnosis not present

## 2023-08-23 DIAGNOSIS — I129 Hypertensive chronic kidney disease with stage 1 through stage 4 chronic kidney disease, or unspecified chronic kidney disease: Secondary | ICD-10-CM | POA: Diagnosis not present

## 2023-08-23 DIAGNOSIS — N2889 Other specified disorders of kidney and ureter: Secondary | ICD-10-CM | POA: Diagnosis not present

## 2023-08-25 DIAGNOSIS — I130 Hypertensive heart and chronic kidney disease with heart failure and stage 1 through stage 4 chronic kidney disease, or unspecified chronic kidney disease: Secondary | ICD-10-CM | POA: Diagnosis not present

## 2023-08-25 DIAGNOSIS — Z8572 Personal history of non-Hodgkin lymphomas: Secondary | ICD-10-CM | POA: Diagnosis not present

## 2023-08-25 DIAGNOSIS — N183 Chronic kidney disease, stage 3 unspecified: Secondary | ICD-10-CM | POA: Diagnosis not present

## 2023-08-25 DIAGNOSIS — E785 Hyperlipidemia, unspecified: Secondary | ICD-10-CM | POA: Diagnosis not present

## 2023-08-25 DIAGNOSIS — D631 Anemia in chronic kidney disease: Secondary | ICD-10-CM | POA: Diagnosis not present

## 2023-08-25 DIAGNOSIS — D75839 Thrombocytosis, unspecified: Secondary | ICD-10-CM | POA: Diagnosis not present

## 2023-08-25 DIAGNOSIS — E1122 Type 2 diabetes mellitus with diabetic chronic kidney disease: Secondary | ICD-10-CM | POA: Diagnosis not present

## 2023-08-25 DIAGNOSIS — Z85828 Personal history of other malignant neoplasm of skin: Secondary | ICD-10-CM | POA: Diagnosis not present

## 2023-08-25 DIAGNOSIS — H409 Unspecified glaucoma: Secondary | ICD-10-CM | POA: Diagnosis not present

## 2023-08-25 DIAGNOSIS — Z96643 Presence of artificial hip joint, bilateral: Secondary | ICD-10-CM | POA: Diagnosis not present

## 2023-08-25 DIAGNOSIS — E876 Hypokalemia: Secondary | ICD-10-CM | POA: Diagnosis not present

## 2023-08-25 DIAGNOSIS — G8929 Other chronic pain: Secondary | ICD-10-CM | POA: Diagnosis not present

## 2023-08-25 DIAGNOSIS — M545 Low back pain, unspecified: Secondary | ICD-10-CM | POA: Diagnosis not present

## 2023-08-25 DIAGNOSIS — Z96651 Presence of right artificial knee joint: Secondary | ICD-10-CM | POA: Diagnosis not present

## 2023-08-25 DIAGNOSIS — K8020 Calculus of gallbladder without cholecystitis without obstruction: Secondary | ICD-10-CM | POA: Diagnosis not present

## 2023-08-25 DIAGNOSIS — I480 Paroxysmal atrial fibrillation: Secondary | ICD-10-CM | POA: Diagnosis not present

## 2023-08-25 DIAGNOSIS — G4733 Obstructive sleep apnea (adult) (pediatric): Secondary | ICD-10-CM | POA: Diagnosis not present

## 2023-08-25 DIAGNOSIS — I4892 Unspecified atrial flutter: Secondary | ICD-10-CM | POA: Diagnosis not present

## 2023-08-25 DIAGNOSIS — Z7952 Long term (current) use of systemic steroids: Secondary | ICD-10-CM | POA: Diagnosis not present

## 2023-08-25 DIAGNOSIS — N179 Acute kidney failure, unspecified: Secondary | ICD-10-CM | POA: Diagnosis not present

## 2023-08-25 DIAGNOSIS — I491 Atrial premature depolarization: Secondary | ICD-10-CM | POA: Diagnosis not present

## 2023-08-25 DIAGNOSIS — I5032 Chronic diastolic (congestive) heart failure: Secondary | ICD-10-CM | POA: Diagnosis not present

## 2023-08-29 DIAGNOSIS — M545 Low back pain, unspecified: Secondary | ICD-10-CM | POA: Diagnosis not present

## 2023-08-29 DIAGNOSIS — Z8572 Personal history of non-Hodgkin lymphomas: Secondary | ICD-10-CM | POA: Diagnosis not present

## 2023-08-29 DIAGNOSIS — I480 Paroxysmal atrial fibrillation: Secondary | ICD-10-CM | POA: Diagnosis not present

## 2023-08-29 DIAGNOSIS — I4892 Unspecified atrial flutter: Secondary | ICD-10-CM | POA: Diagnosis not present

## 2023-08-29 DIAGNOSIS — E876 Hypokalemia: Secondary | ICD-10-CM | POA: Diagnosis not present

## 2023-08-29 DIAGNOSIS — Z85828 Personal history of other malignant neoplasm of skin: Secondary | ICD-10-CM | POA: Diagnosis not present

## 2023-08-29 DIAGNOSIS — K8020 Calculus of gallbladder without cholecystitis without obstruction: Secondary | ICD-10-CM | POA: Diagnosis not present

## 2023-08-29 DIAGNOSIS — Z96643 Presence of artificial hip joint, bilateral: Secondary | ICD-10-CM | POA: Diagnosis not present

## 2023-08-29 DIAGNOSIS — G4733 Obstructive sleep apnea (adult) (pediatric): Secondary | ICD-10-CM | POA: Diagnosis not present

## 2023-08-29 DIAGNOSIS — Z7952 Long term (current) use of systemic steroids: Secondary | ICD-10-CM | POA: Diagnosis not present

## 2023-08-29 DIAGNOSIS — N179 Acute kidney failure, unspecified: Secondary | ICD-10-CM | POA: Diagnosis not present

## 2023-08-29 DIAGNOSIS — I130 Hypertensive heart and chronic kidney disease with heart failure and stage 1 through stage 4 chronic kidney disease, or unspecified chronic kidney disease: Secondary | ICD-10-CM | POA: Diagnosis not present

## 2023-08-29 DIAGNOSIS — E785 Hyperlipidemia, unspecified: Secondary | ICD-10-CM | POA: Diagnosis not present

## 2023-08-29 DIAGNOSIS — D75839 Thrombocytosis, unspecified: Secondary | ICD-10-CM | POA: Diagnosis not present

## 2023-08-29 DIAGNOSIS — E1122 Type 2 diabetes mellitus with diabetic chronic kidney disease: Secondary | ICD-10-CM | POA: Diagnosis not present

## 2023-08-29 DIAGNOSIS — H409 Unspecified glaucoma: Secondary | ICD-10-CM | POA: Diagnosis not present

## 2023-08-29 DIAGNOSIS — I5032 Chronic diastolic (congestive) heart failure: Secondary | ICD-10-CM | POA: Diagnosis not present

## 2023-08-29 DIAGNOSIS — D631 Anemia in chronic kidney disease: Secondary | ICD-10-CM | POA: Diagnosis not present

## 2023-08-29 DIAGNOSIS — I491 Atrial premature depolarization: Secondary | ICD-10-CM | POA: Diagnosis not present

## 2023-08-29 DIAGNOSIS — G8929 Other chronic pain: Secondary | ICD-10-CM | POA: Diagnosis not present

## 2023-08-29 DIAGNOSIS — N183 Chronic kidney disease, stage 3 unspecified: Secondary | ICD-10-CM | POA: Diagnosis not present

## 2023-08-29 DIAGNOSIS — Z96651 Presence of right artificial knee joint: Secondary | ICD-10-CM | POA: Diagnosis not present

## 2023-08-31 DIAGNOSIS — I130 Hypertensive heart and chronic kidney disease with heart failure and stage 1 through stage 4 chronic kidney disease, or unspecified chronic kidney disease: Secondary | ICD-10-CM | POA: Diagnosis not present

## 2023-08-31 DIAGNOSIS — G8929 Other chronic pain: Secondary | ICD-10-CM | POA: Diagnosis not present

## 2023-08-31 DIAGNOSIS — Z7952 Long term (current) use of systemic steroids: Secondary | ICD-10-CM | POA: Diagnosis not present

## 2023-08-31 DIAGNOSIS — I491 Atrial premature depolarization: Secondary | ICD-10-CM | POA: Diagnosis not present

## 2023-08-31 DIAGNOSIS — Z85828 Personal history of other malignant neoplasm of skin: Secondary | ICD-10-CM | POA: Diagnosis not present

## 2023-08-31 DIAGNOSIS — M545 Low back pain, unspecified: Secondary | ICD-10-CM | POA: Diagnosis not present

## 2023-08-31 DIAGNOSIS — E1122 Type 2 diabetes mellitus with diabetic chronic kidney disease: Secondary | ICD-10-CM | POA: Diagnosis not present

## 2023-08-31 DIAGNOSIS — Z96651 Presence of right artificial knee joint: Secondary | ICD-10-CM | POA: Diagnosis not present

## 2023-08-31 DIAGNOSIS — N183 Chronic kidney disease, stage 3 unspecified: Secondary | ICD-10-CM | POA: Diagnosis not present

## 2023-08-31 DIAGNOSIS — D75839 Thrombocytosis, unspecified: Secondary | ICD-10-CM | POA: Diagnosis not present

## 2023-08-31 DIAGNOSIS — I480 Paroxysmal atrial fibrillation: Secondary | ICD-10-CM | POA: Diagnosis not present

## 2023-08-31 DIAGNOSIS — E785 Hyperlipidemia, unspecified: Secondary | ICD-10-CM | POA: Diagnosis not present

## 2023-08-31 DIAGNOSIS — Z96643 Presence of artificial hip joint, bilateral: Secondary | ICD-10-CM | POA: Diagnosis not present

## 2023-08-31 DIAGNOSIS — G4733 Obstructive sleep apnea (adult) (pediatric): Secondary | ICD-10-CM | POA: Diagnosis not present

## 2023-08-31 DIAGNOSIS — Z8572 Personal history of non-Hodgkin lymphomas: Secondary | ICD-10-CM | POA: Diagnosis not present

## 2023-08-31 DIAGNOSIS — D631 Anemia in chronic kidney disease: Secondary | ICD-10-CM | POA: Diagnosis not present

## 2023-08-31 DIAGNOSIS — N179 Acute kidney failure, unspecified: Secondary | ICD-10-CM | POA: Diagnosis not present

## 2023-08-31 DIAGNOSIS — E876 Hypokalemia: Secondary | ICD-10-CM | POA: Diagnosis not present

## 2023-08-31 DIAGNOSIS — I5032 Chronic diastolic (congestive) heart failure: Secondary | ICD-10-CM | POA: Diagnosis not present

## 2023-08-31 DIAGNOSIS — K8020 Calculus of gallbladder without cholecystitis without obstruction: Secondary | ICD-10-CM | POA: Diagnosis not present

## 2023-08-31 DIAGNOSIS — H409 Unspecified glaucoma: Secondary | ICD-10-CM | POA: Diagnosis not present

## 2023-08-31 DIAGNOSIS — I4892 Unspecified atrial flutter: Secondary | ICD-10-CM | POA: Diagnosis not present

## 2023-09-01 DIAGNOSIS — E876 Hypokalemia: Secondary | ICD-10-CM | POA: Diagnosis not present

## 2023-09-01 DIAGNOSIS — I5032 Chronic diastolic (congestive) heart failure: Secondary | ICD-10-CM | POA: Diagnosis not present

## 2023-09-01 DIAGNOSIS — M545 Low back pain, unspecified: Secondary | ICD-10-CM | POA: Diagnosis not present

## 2023-09-01 DIAGNOSIS — Z7952 Long term (current) use of systemic steroids: Secondary | ICD-10-CM | POA: Diagnosis not present

## 2023-09-01 DIAGNOSIS — Z8572 Personal history of non-Hodgkin lymphomas: Secondary | ICD-10-CM | POA: Diagnosis not present

## 2023-09-01 DIAGNOSIS — E1122 Type 2 diabetes mellitus with diabetic chronic kidney disease: Secondary | ICD-10-CM | POA: Diagnosis not present

## 2023-09-01 DIAGNOSIS — D75839 Thrombocytosis, unspecified: Secondary | ICD-10-CM | POA: Diagnosis not present

## 2023-09-01 DIAGNOSIS — I480 Paroxysmal atrial fibrillation: Secondary | ICD-10-CM | POA: Diagnosis not present

## 2023-09-01 DIAGNOSIS — G4733 Obstructive sleep apnea (adult) (pediatric): Secondary | ICD-10-CM | POA: Diagnosis not present

## 2023-09-01 DIAGNOSIS — Z96643 Presence of artificial hip joint, bilateral: Secondary | ICD-10-CM | POA: Diagnosis not present

## 2023-09-01 DIAGNOSIS — E785 Hyperlipidemia, unspecified: Secondary | ICD-10-CM | POA: Diagnosis not present

## 2023-09-01 DIAGNOSIS — D631 Anemia in chronic kidney disease: Secondary | ICD-10-CM | POA: Diagnosis not present

## 2023-09-01 DIAGNOSIS — Z85828 Personal history of other malignant neoplasm of skin: Secondary | ICD-10-CM | POA: Diagnosis not present

## 2023-09-01 DIAGNOSIS — Z96651 Presence of right artificial knee joint: Secondary | ICD-10-CM | POA: Diagnosis not present

## 2023-09-01 DIAGNOSIS — N179 Acute kidney failure, unspecified: Secondary | ICD-10-CM | POA: Diagnosis not present

## 2023-09-01 DIAGNOSIS — N183 Chronic kidney disease, stage 3 unspecified: Secondary | ICD-10-CM | POA: Diagnosis not present

## 2023-09-01 DIAGNOSIS — I130 Hypertensive heart and chronic kidney disease with heart failure and stage 1 through stage 4 chronic kidney disease, or unspecified chronic kidney disease: Secondary | ICD-10-CM | POA: Diagnosis not present

## 2023-09-01 DIAGNOSIS — I4892 Unspecified atrial flutter: Secondary | ICD-10-CM | POA: Diagnosis not present

## 2023-09-01 DIAGNOSIS — G8929 Other chronic pain: Secondary | ICD-10-CM | POA: Diagnosis not present

## 2023-09-01 DIAGNOSIS — H409 Unspecified glaucoma: Secondary | ICD-10-CM | POA: Diagnosis not present

## 2023-09-01 DIAGNOSIS — K8020 Calculus of gallbladder without cholecystitis without obstruction: Secondary | ICD-10-CM | POA: Diagnosis not present

## 2023-09-01 DIAGNOSIS — I491 Atrial premature depolarization: Secondary | ICD-10-CM | POA: Diagnosis not present

## 2023-09-02 ENCOUNTER — Other Ambulatory Visit: Payer: Self-pay | Admitting: Internal Medicine

## 2023-09-06 DIAGNOSIS — H409 Unspecified glaucoma: Secondary | ICD-10-CM | POA: Diagnosis not present

## 2023-09-06 DIAGNOSIS — D75839 Thrombocytosis, unspecified: Secondary | ICD-10-CM | POA: Diagnosis not present

## 2023-09-06 DIAGNOSIS — Z85828 Personal history of other malignant neoplasm of skin: Secondary | ICD-10-CM | POA: Diagnosis not present

## 2023-09-06 DIAGNOSIS — I130 Hypertensive heart and chronic kidney disease with heart failure and stage 1 through stage 4 chronic kidney disease, or unspecified chronic kidney disease: Secondary | ICD-10-CM | POA: Diagnosis not present

## 2023-09-06 DIAGNOSIS — Z96643 Presence of artificial hip joint, bilateral: Secondary | ICD-10-CM | POA: Diagnosis not present

## 2023-09-06 DIAGNOSIS — Z96651 Presence of right artificial knee joint: Secondary | ICD-10-CM | POA: Diagnosis not present

## 2023-09-06 DIAGNOSIS — N183 Chronic kidney disease, stage 3 unspecified: Secondary | ICD-10-CM | POA: Diagnosis not present

## 2023-09-06 DIAGNOSIS — Z7952 Long term (current) use of systemic steroids: Secondary | ICD-10-CM | POA: Diagnosis not present

## 2023-09-06 DIAGNOSIS — G8929 Other chronic pain: Secondary | ICD-10-CM | POA: Diagnosis not present

## 2023-09-06 DIAGNOSIS — G4733 Obstructive sleep apnea (adult) (pediatric): Secondary | ICD-10-CM | POA: Diagnosis not present

## 2023-09-06 DIAGNOSIS — E785 Hyperlipidemia, unspecified: Secondary | ICD-10-CM | POA: Diagnosis not present

## 2023-09-06 DIAGNOSIS — Z8572 Personal history of non-Hodgkin lymphomas: Secondary | ICD-10-CM | POA: Diagnosis not present

## 2023-09-06 DIAGNOSIS — I480 Paroxysmal atrial fibrillation: Secondary | ICD-10-CM | POA: Diagnosis not present

## 2023-09-06 DIAGNOSIS — I4892 Unspecified atrial flutter: Secondary | ICD-10-CM | POA: Diagnosis not present

## 2023-09-06 DIAGNOSIS — M545 Low back pain, unspecified: Secondary | ICD-10-CM | POA: Diagnosis not present

## 2023-09-06 DIAGNOSIS — K8020 Calculus of gallbladder without cholecystitis without obstruction: Secondary | ICD-10-CM | POA: Diagnosis not present

## 2023-09-06 DIAGNOSIS — I5032 Chronic diastolic (congestive) heart failure: Secondary | ICD-10-CM | POA: Diagnosis not present

## 2023-09-06 DIAGNOSIS — I491 Atrial premature depolarization: Secondary | ICD-10-CM | POA: Diagnosis not present

## 2023-09-06 DIAGNOSIS — D631 Anemia in chronic kidney disease: Secondary | ICD-10-CM | POA: Diagnosis not present

## 2023-09-06 DIAGNOSIS — E876 Hypokalemia: Secondary | ICD-10-CM | POA: Diagnosis not present

## 2023-09-06 DIAGNOSIS — N179 Acute kidney failure, unspecified: Secondary | ICD-10-CM | POA: Diagnosis not present

## 2023-09-06 DIAGNOSIS — E1122 Type 2 diabetes mellitus with diabetic chronic kidney disease: Secondary | ICD-10-CM | POA: Diagnosis not present

## 2023-09-08 DIAGNOSIS — K8020 Calculus of gallbladder without cholecystitis without obstruction: Secondary | ICD-10-CM | POA: Diagnosis not present

## 2023-09-08 DIAGNOSIS — G8929 Other chronic pain: Secondary | ICD-10-CM | POA: Diagnosis not present

## 2023-09-08 DIAGNOSIS — I480 Paroxysmal atrial fibrillation: Secondary | ICD-10-CM | POA: Diagnosis not present

## 2023-09-08 DIAGNOSIS — E1122 Type 2 diabetes mellitus with diabetic chronic kidney disease: Secondary | ICD-10-CM | POA: Diagnosis not present

## 2023-09-08 DIAGNOSIS — Z96651 Presence of right artificial knee joint: Secondary | ICD-10-CM | POA: Diagnosis not present

## 2023-09-08 DIAGNOSIS — D75839 Thrombocytosis, unspecified: Secondary | ICD-10-CM | POA: Diagnosis not present

## 2023-09-08 DIAGNOSIS — I4892 Unspecified atrial flutter: Secondary | ICD-10-CM | POA: Diagnosis not present

## 2023-09-08 DIAGNOSIS — G4733 Obstructive sleep apnea (adult) (pediatric): Secondary | ICD-10-CM | POA: Diagnosis not present

## 2023-09-08 DIAGNOSIS — E876 Hypokalemia: Secondary | ICD-10-CM | POA: Diagnosis not present

## 2023-09-08 DIAGNOSIS — Z7952 Long term (current) use of systemic steroids: Secondary | ICD-10-CM | POA: Diagnosis not present

## 2023-09-08 DIAGNOSIS — M545 Low back pain, unspecified: Secondary | ICD-10-CM | POA: Diagnosis not present

## 2023-09-08 DIAGNOSIS — N183 Chronic kidney disease, stage 3 unspecified: Secondary | ICD-10-CM | POA: Diagnosis not present

## 2023-09-08 DIAGNOSIS — Z8572 Personal history of non-Hodgkin lymphomas: Secondary | ICD-10-CM | POA: Diagnosis not present

## 2023-09-08 DIAGNOSIS — Z96643 Presence of artificial hip joint, bilateral: Secondary | ICD-10-CM | POA: Diagnosis not present

## 2023-09-08 DIAGNOSIS — Z85828 Personal history of other malignant neoplasm of skin: Secondary | ICD-10-CM | POA: Diagnosis not present

## 2023-09-08 DIAGNOSIS — N179 Acute kidney failure, unspecified: Secondary | ICD-10-CM | POA: Diagnosis not present

## 2023-09-08 DIAGNOSIS — I491 Atrial premature depolarization: Secondary | ICD-10-CM | POA: Diagnosis not present

## 2023-09-08 DIAGNOSIS — E785 Hyperlipidemia, unspecified: Secondary | ICD-10-CM | POA: Diagnosis not present

## 2023-09-08 DIAGNOSIS — D631 Anemia in chronic kidney disease: Secondary | ICD-10-CM | POA: Diagnosis not present

## 2023-09-08 DIAGNOSIS — I5032 Chronic diastolic (congestive) heart failure: Secondary | ICD-10-CM | POA: Diagnosis not present

## 2023-09-08 DIAGNOSIS — I130 Hypertensive heart and chronic kidney disease with heart failure and stage 1 through stage 4 chronic kidney disease, or unspecified chronic kidney disease: Secondary | ICD-10-CM | POA: Diagnosis not present

## 2023-09-08 DIAGNOSIS — H409 Unspecified glaucoma: Secondary | ICD-10-CM | POA: Diagnosis not present

## 2023-09-13 ENCOUNTER — Ambulatory Visit: Payer: Self-pay

## 2023-09-13 DIAGNOSIS — G8929 Other chronic pain: Secondary | ICD-10-CM | POA: Diagnosis not present

## 2023-09-13 DIAGNOSIS — E785 Hyperlipidemia, unspecified: Secondary | ICD-10-CM | POA: Diagnosis not present

## 2023-09-13 DIAGNOSIS — Z96651 Presence of right artificial knee joint: Secondary | ICD-10-CM | POA: Diagnosis not present

## 2023-09-13 DIAGNOSIS — I130 Hypertensive heart and chronic kidney disease with heart failure and stage 1 through stage 4 chronic kidney disease, or unspecified chronic kidney disease: Secondary | ICD-10-CM | POA: Diagnosis not present

## 2023-09-13 DIAGNOSIS — Z85828 Personal history of other malignant neoplasm of skin: Secondary | ICD-10-CM | POA: Diagnosis not present

## 2023-09-13 DIAGNOSIS — N183 Chronic kidney disease, stage 3 unspecified: Secondary | ICD-10-CM | POA: Diagnosis not present

## 2023-09-13 DIAGNOSIS — E876 Hypokalemia: Secondary | ICD-10-CM | POA: Diagnosis not present

## 2023-09-13 DIAGNOSIS — E1122 Type 2 diabetes mellitus with diabetic chronic kidney disease: Secondary | ICD-10-CM | POA: Diagnosis not present

## 2023-09-13 DIAGNOSIS — Z96643 Presence of artificial hip joint, bilateral: Secondary | ICD-10-CM | POA: Diagnosis not present

## 2023-09-13 DIAGNOSIS — I480 Paroxysmal atrial fibrillation: Secondary | ICD-10-CM | POA: Diagnosis not present

## 2023-09-13 DIAGNOSIS — N179 Acute kidney failure, unspecified: Secondary | ICD-10-CM | POA: Diagnosis not present

## 2023-09-13 DIAGNOSIS — K8020 Calculus of gallbladder without cholecystitis without obstruction: Secondary | ICD-10-CM | POA: Diagnosis not present

## 2023-09-13 DIAGNOSIS — M545 Low back pain, unspecified: Secondary | ICD-10-CM | POA: Diagnosis not present

## 2023-09-13 DIAGNOSIS — I4892 Unspecified atrial flutter: Secondary | ICD-10-CM | POA: Diagnosis not present

## 2023-09-13 DIAGNOSIS — D75839 Thrombocytosis, unspecified: Secondary | ICD-10-CM | POA: Diagnosis not present

## 2023-09-13 DIAGNOSIS — G4733 Obstructive sleep apnea (adult) (pediatric): Secondary | ICD-10-CM | POA: Diagnosis not present

## 2023-09-13 DIAGNOSIS — Z7952 Long term (current) use of systemic steroids: Secondary | ICD-10-CM | POA: Diagnosis not present

## 2023-09-13 DIAGNOSIS — H409 Unspecified glaucoma: Secondary | ICD-10-CM | POA: Diagnosis not present

## 2023-09-13 DIAGNOSIS — D631 Anemia in chronic kidney disease: Secondary | ICD-10-CM | POA: Diagnosis not present

## 2023-09-13 DIAGNOSIS — Z8572 Personal history of non-Hodgkin lymphomas: Secondary | ICD-10-CM | POA: Diagnosis not present

## 2023-09-13 DIAGNOSIS — I5032 Chronic diastolic (congestive) heart failure: Secondary | ICD-10-CM | POA: Diagnosis not present

## 2023-09-13 DIAGNOSIS — I491 Atrial premature depolarization: Secondary | ICD-10-CM | POA: Diagnosis not present

## 2023-09-13 NOTE — Patient Instructions (Signed)
Visit Information  Thank you for taking time to visit with me today. Please don't hesitate to contact me if I can be of assistance to you.   Following are the goals we discussed today:   Goals Addressed             This Visit's Progress    Management of health conditions       Interventions Today    Flowsheet Row Most Recent Value  Chronic Disease   Chronic disease during today's visit Congestive Heart Failure (CHF), Diabetes, Chronic Kidney Disease/End Stage Renal Disease (ESRD)  General Interventions   General Interventions Discussed/Reviewed General Interventions Reviewed, Doctor Visits  [evaluation of current treatment plan for listed health conditions and patients adherence to plan as established by provider. Assessed for BS, BP and weight readings.]  Doctor Visits Discussed/Reviewed Doctor Visits Reviewed  [name] upcoming provider visits. Advised to keep follow up visits with provider as recommended.]  Education Interventions   Education Provided Provided Education  [Advised to monitor BP and weight daily and record. Advised to notify provider of BP outside of established parameters. Advised to notify provider for weight gain of 3 lbs overnight or 5 lbs in a week, increase SOB and/ or increase swelling in LE.]  Provided Verbal Education On Blood Sugar Monitoring  [Advised to continue wearing compression hose as advised by provider.  Reviewed HF symptoms. Discussed hypoglycemia treatment and advised to call provider frequent BS < 70. Confirmed patient still receiving home health services with [facility].]  Nutrition Interventions   Nutrition Discussed/Reviewed Nutrition Reviewed, Carbohydrate meal planning, Decreasing sugar intake  Pharmacy Interventions   Pharmacy Dicussed/Reviewed Pharmacy Topics Reviewed  [medications reviewed and compliance discussed. Advised to call nephrology office to inquire if farxiga is to be prescribed. Reviewd nephrology note from patients  visit on 08/23/23. No mention of farxiga noted in 08/23/23 note.]                 Our next appointment is by telephone on 10/25/23  at 2 pm  Please call the care guide team at [phone] if you need to cancel or reschedule your appointment.   If you are experiencing a Mental Health or Behavioral Health Crisis or need someone to talk to, please call the Suicide and Crisis Lifeline: 988 call 1-800-273-TALK (toll free, 24 hour hotline)  Patient verbalizes understanding of instructions and care plan provided today and agrees to view in MyChart. Active MyChart status and patient understanding of how to access instructions and care plan via MyChart confirmed with patient.     [name] RN, BSN, CCM [employer], Population Health Case Manager Phone: [phone]

## 2023-09-13 NOTE — Patient Outreach (Signed)
  Care Coordination   Follow Up Visit Note   09/13/2023 Name: Daniel Kline MRN: [id number] DOB: [birth year]  Daniel Kline is a 78 y.o. year old male who sees Daniel Charlie FERNS, MD for primary care. I spoke with  Daniel Kline by phone today.  What matters to the patients health and wellness today?  Patient states he is doing well. He reports having visit from the nurse with home health today.  Patient reports having follow up visit with nephrologist on 08/23/23.  He states he started checking his blood pressure as advised, is weighing and checking his blood sugars.  Patient reports weight today was 244 lbs, blood sugar fasting was 112 and blood pressure was 132/70.  Patient states he has only checked this 1 blood pressure.  He reports blood sugars are ranging from 100-140's.   He states the nephrology mentioned prescribing him Farxiga at his recent follow up visit however states it has not been prescribed.  Patient states he continues to wear his compression hose. He denies any increase in SOB and/ or swelling in LE.   Goals Addressed             This Visit's Progress    Management of health conditions       Interventions Today    Flowsheet Row Most Recent Value  Chronic Disease   Chronic disease during today's visit Congestive Heart Failure (CHF), Diabetes, Chronic Kidney Disease/End Stage Renal Disease (ESRD)  General Interventions   General Interventions Discussed/Reviewed General Interventions Reviewed, Doctor Visits  [evaluation of current treatment plan for listed health conditions and patients adherence to plan as established by provider. Assessed for BS, BP and weight readings.]  Doctor Visits Discussed/Reviewed Doctor Visits Reviewed  bethann upcoming provider visits. Advised to keep follow up visits with provider as recommended.]  Education Interventions   Education Provided Provided Education  [Advised to monitor BP and weight daily and record. Advised to notify  provider of BP outside of established parameters. Advised to notify provider for weight gain of 3 lbs overnight or 5 lbs in a week, increase SOB and/ or increase swelling in LE.]  Provided Verbal Education On Blood Sugar Monitoring  [Advised to continue wearing compression hose as advised by provider.  Reviewed HF symptoms. Discussed hypoglycemia treatment and advised to call provider frequent BS < 70. Confirmed patient still receiving home health services with [facility].]  Nutrition Interventions   Nutrition Discussed/Reviewed Nutrition Reviewed, Carbohydrate meal planning, Decreasing sugar intake  Pharmacy Interventions   Pharmacy Dicussed/Reviewed Pharmacy Topics Reviewed  [medications reviewed and compliance discussed. Advised to call nephrology office to inquire if farxiga is to be prescribed. Reviewd nephrology note from patients visit on 08/23/23. No mention of farxiga noted in 08/23/23 note.]                 SDOH assessments and interventions completed:  No     Care Coordination Interventions:  Yes, provided   Follow up plan: Follow up call scheduled for 10/25/23 at 2 pm    Encounter Outcome:  Patient Visit Completed   Daniel Victor RN, BSN, CCM Daniel Kline  Daniel Kline, Population Health Case Manager Phone: [phone]

## 2023-09-15 ENCOUNTER — Other Ambulatory Visit: Payer: Self-pay | Admitting: Internal Medicine

## 2023-09-15 DIAGNOSIS — N179 Acute kidney failure, unspecified: Secondary | ICD-10-CM | POA: Diagnosis not present

## 2023-09-15 DIAGNOSIS — M545 Low back pain, unspecified: Secondary | ICD-10-CM | POA: Diagnosis not present

## 2023-09-15 DIAGNOSIS — G4733 Obstructive sleep apnea (adult) (pediatric): Secondary | ICD-10-CM | POA: Diagnosis not present

## 2023-09-15 DIAGNOSIS — Z85828 Personal history of other malignant neoplasm of skin: Secondary | ICD-10-CM | POA: Diagnosis not present

## 2023-09-15 DIAGNOSIS — I4892 Unspecified atrial flutter: Secondary | ICD-10-CM | POA: Diagnosis not present

## 2023-09-15 DIAGNOSIS — D631 Anemia in chronic kidney disease: Secondary | ICD-10-CM | POA: Diagnosis not present

## 2023-09-15 DIAGNOSIS — D75839 Thrombocytosis, unspecified: Secondary | ICD-10-CM | POA: Diagnosis not present

## 2023-09-15 DIAGNOSIS — Z96651 Presence of right artificial knee joint: Secondary | ICD-10-CM | POA: Diagnosis not present

## 2023-09-15 DIAGNOSIS — I5032 Chronic diastolic (congestive) heart failure: Secondary | ICD-10-CM | POA: Diagnosis not present

## 2023-09-15 DIAGNOSIS — H409 Unspecified glaucoma: Secondary | ICD-10-CM | POA: Diagnosis not present

## 2023-09-15 DIAGNOSIS — I491 Atrial premature depolarization: Secondary | ICD-10-CM | POA: Diagnosis not present

## 2023-09-15 DIAGNOSIS — E876 Hypokalemia: Secondary | ICD-10-CM | POA: Diagnosis not present

## 2023-09-15 DIAGNOSIS — Z96643 Presence of artificial hip joint, bilateral: Secondary | ICD-10-CM | POA: Diagnosis not present

## 2023-09-15 DIAGNOSIS — E1122 Type 2 diabetes mellitus with diabetic chronic kidney disease: Secondary | ICD-10-CM | POA: Diagnosis not present

## 2023-09-15 DIAGNOSIS — K8020 Calculus of gallbladder without cholecystitis without obstruction: Secondary | ICD-10-CM | POA: Diagnosis not present

## 2023-09-15 DIAGNOSIS — Z7952 Long term (current) use of systemic steroids: Secondary | ICD-10-CM | POA: Diagnosis not present

## 2023-09-15 DIAGNOSIS — I130 Hypertensive heart and chronic kidney disease with heart failure and stage 1 through stage 4 chronic kidney disease, or unspecified chronic kidney disease: Secondary | ICD-10-CM | POA: Diagnosis not present

## 2023-09-15 DIAGNOSIS — G8929 Other chronic pain: Secondary | ICD-10-CM | POA: Diagnosis not present

## 2023-09-15 DIAGNOSIS — N183 Chronic kidney disease, stage 3 unspecified: Secondary | ICD-10-CM | POA: Diagnosis not present

## 2023-09-15 DIAGNOSIS — E785 Hyperlipidemia, unspecified: Secondary | ICD-10-CM | POA: Diagnosis not present

## 2023-09-15 DIAGNOSIS — Z8572 Personal history of non-Hodgkin lymphomas: Secondary | ICD-10-CM | POA: Diagnosis not present

## 2023-09-15 DIAGNOSIS — I480 Paroxysmal atrial fibrillation: Secondary | ICD-10-CM | POA: Diagnosis not present

## 2023-09-15 NOTE — Telephone Encounter (Signed)
 Name of Medication: Hydrocodone  Name of Pharmacy: [facility] Last Fill or Written Date and Quantity: 08-17-23 #60 Last Office Visit and Type: 08-16-23 Next Office Visit and Type: 10-31-23 Last Controlled Substance Agreement Date: 01-27-23 Last UDS: 01-27-23

## 2023-09-21 DIAGNOSIS — I4892 Unspecified atrial flutter: Secondary | ICD-10-CM | POA: Diagnosis not present

## 2023-09-21 DIAGNOSIS — I5032 Chronic diastolic (congestive) heart failure: Secondary | ICD-10-CM | POA: Diagnosis not present

## 2023-09-21 DIAGNOSIS — H409 Unspecified glaucoma: Secondary | ICD-10-CM | POA: Diagnosis not present

## 2023-09-21 DIAGNOSIS — Z96643 Presence of artificial hip joint, bilateral: Secondary | ICD-10-CM | POA: Diagnosis not present

## 2023-09-21 DIAGNOSIS — E876 Hypokalemia: Secondary | ICD-10-CM | POA: Diagnosis not present

## 2023-09-21 DIAGNOSIS — I491 Atrial premature depolarization: Secondary | ICD-10-CM | POA: Diagnosis not present

## 2023-09-21 DIAGNOSIS — M545 Low back pain, unspecified: Secondary | ICD-10-CM | POA: Diagnosis not present

## 2023-09-21 DIAGNOSIS — I130 Hypertensive heart and chronic kidney disease with heart failure and stage 1 through stage 4 chronic kidney disease, or unspecified chronic kidney disease: Secondary | ICD-10-CM | POA: Diagnosis not present

## 2023-09-21 DIAGNOSIS — D631 Anemia in chronic kidney disease: Secondary | ICD-10-CM | POA: Diagnosis not present

## 2023-09-21 DIAGNOSIS — Z8572 Personal history of non-Hodgkin lymphomas: Secondary | ICD-10-CM | POA: Diagnosis not present

## 2023-09-21 DIAGNOSIS — I480 Paroxysmal atrial fibrillation: Secondary | ICD-10-CM | POA: Diagnosis not present

## 2023-09-21 DIAGNOSIS — E785 Hyperlipidemia, unspecified: Secondary | ICD-10-CM | POA: Diagnosis not present

## 2023-09-21 DIAGNOSIS — E1122 Type 2 diabetes mellitus with diabetic chronic kidney disease: Secondary | ICD-10-CM | POA: Diagnosis not present

## 2023-09-21 DIAGNOSIS — G4733 Obstructive sleep apnea (adult) (pediatric): Secondary | ICD-10-CM | POA: Diagnosis not present

## 2023-09-21 DIAGNOSIS — Z7952 Long term (current) use of systemic steroids: Secondary | ICD-10-CM | POA: Diagnosis not present

## 2023-09-21 DIAGNOSIS — N179 Acute kidney failure, unspecified: Secondary | ICD-10-CM | POA: Diagnosis not present

## 2023-09-21 DIAGNOSIS — K8020 Calculus of gallbladder without cholecystitis without obstruction: Secondary | ICD-10-CM | POA: Diagnosis not present

## 2023-09-21 DIAGNOSIS — Z96651 Presence of right artificial knee joint: Secondary | ICD-10-CM | POA: Diagnosis not present

## 2023-09-21 DIAGNOSIS — N183 Chronic kidney disease, stage 3 unspecified: Secondary | ICD-10-CM | POA: Diagnosis not present

## 2023-09-21 DIAGNOSIS — G8929 Other chronic pain: Secondary | ICD-10-CM | POA: Diagnosis not present

## 2023-09-21 DIAGNOSIS — D75839 Thrombocytosis, unspecified: Secondary | ICD-10-CM | POA: Diagnosis not present

## 2023-09-21 DIAGNOSIS — Z85828 Personal history of other malignant neoplasm of skin: Secondary | ICD-10-CM | POA: Diagnosis not present

## 2023-09-22 DIAGNOSIS — N2581 Secondary hyperparathyroidism of renal origin: Secondary | ICD-10-CM | POA: Diagnosis not present

## 2023-09-22 DIAGNOSIS — I129 Hypertensive chronic kidney disease with stage 1 through stage 4 chronic kidney disease, or unspecified chronic kidney disease: Secondary | ICD-10-CM | POA: Diagnosis not present

## 2023-09-22 DIAGNOSIS — D631 Anemia in chronic kidney disease: Secondary | ICD-10-CM | POA: Diagnosis not present

## 2023-09-22 DIAGNOSIS — N2889 Other specified disorders of kidney and ureter: Secondary | ICD-10-CM | POA: Diagnosis not present

## 2023-09-22 DIAGNOSIS — R809 Proteinuria, unspecified: Secondary | ICD-10-CM | POA: Diagnosis not present

## 2023-09-22 DIAGNOSIS — E1122 Type 2 diabetes mellitus with diabetic chronic kidney disease: Secondary | ICD-10-CM | POA: Diagnosis not present

## 2023-09-22 DIAGNOSIS — N184 Chronic kidney disease, stage 4 (severe): Secondary | ICD-10-CM | POA: Diagnosis not present

## 2023-09-22 DIAGNOSIS — E785 Hyperlipidemia, unspecified: Secondary | ICD-10-CM | POA: Diagnosis not present

## 2023-09-23 ENCOUNTER — Other Ambulatory Visit: Payer: Self-pay

## 2023-09-23 ENCOUNTER — Emergency Department: Payer: Medicare Other

## 2023-09-23 DIAGNOSIS — N189 Chronic kidney disease, unspecified: Secondary | ICD-10-CM | POA: Insufficient documentation

## 2023-09-23 DIAGNOSIS — R079 Chest pain, unspecified: Secondary | ICD-10-CM | POA: Diagnosis not present

## 2023-09-23 DIAGNOSIS — N183 Chronic kidney disease, stage 3 unspecified: Secondary | ICD-10-CM | POA: Diagnosis not present

## 2023-09-23 DIAGNOSIS — Z85828 Personal history of other malignant neoplasm of skin: Secondary | ICD-10-CM | POA: Insufficient documentation

## 2023-09-23 DIAGNOSIS — Z96643 Presence of artificial hip joint, bilateral: Secondary | ICD-10-CM | POA: Diagnosis not present

## 2023-09-23 DIAGNOSIS — I130 Hypertensive heart and chronic kidney disease with heart failure and stage 1 through stage 4 chronic kidney disease, or unspecified chronic kidney disease: Secondary | ICD-10-CM | POA: Diagnosis not present

## 2023-09-23 DIAGNOSIS — I10 Essential (primary) hypertension: Secondary | ICD-10-CM | POA: Diagnosis not present

## 2023-09-23 DIAGNOSIS — J9811 Atelectasis: Secondary | ICD-10-CM | POA: Insufficient documentation

## 2023-09-23 DIAGNOSIS — D75839 Thrombocytosis, unspecified: Secondary | ICD-10-CM | POA: Diagnosis not present

## 2023-09-23 DIAGNOSIS — I491 Atrial premature depolarization: Secondary | ICD-10-CM | POA: Diagnosis not present

## 2023-09-23 DIAGNOSIS — M545 Low back pain, unspecified: Secondary | ICD-10-CM | POA: Diagnosis not present

## 2023-09-23 DIAGNOSIS — Z7952 Long term (current) use of systemic steroids: Secondary | ICD-10-CM | POA: Diagnosis not present

## 2023-09-23 DIAGNOSIS — I712 Thoracic aortic aneurysm, without rupture, unspecified: Secondary | ICD-10-CM | POA: Diagnosis not present

## 2023-09-23 DIAGNOSIS — I4891 Unspecified atrial fibrillation: Secondary | ICD-10-CM | POA: Insufficient documentation

## 2023-09-23 DIAGNOSIS — Z79899 Other long term (current) drug therapy: Secondary | ICD-10-CM | POA: Insufficient documentation

## 2023-09-23 DIAGNOSIS — I129 Hypertensive chronic kidney disease with stage 1 through stage 4 chronic kidney disease, or unspecified chronic kidney disease: Secondary | ICD-10-CM | POA: Diagnosis not present

## 2023-09-23 DIAGNOSIS — E119 Type 2 diabetes mellitus without complications: Secondary | ICD-10-CM | POA: Insufficient documentation

## 2023-09-23 DIAGNOSIS — G8929 Other chronic pain: Secondary | ICD-10-CM | POA: Diagnosis not present

## 2023-09-23 DIAGNOSIS — D631 Anemia in chronic kidney disease: Secondary | ICD-10-CM | POA: Diagnosis not present

## 2023-09-23 DIAGNOSIS — E1122 Type 2 diabetes mellitus with diabetic chronic kidney disease: Secondary | ICD-10-CM | POA: Diagnosis not present

## 2023-09-23 DIAGNOSIS — E785 Hyperlipidemia, unspecified: Secondary | ICD-10-CM | POA: Diagnosis not present

## 2023-09-23 DIAGNOSIS — E876 Hypokalemia: Secondary | ICD-10-CM | POA: Diagnosis not present

## 2023-09-23 DIAGNOSIS — I4892 Unspecified atrial flutter: Secondary | ICD-10-CM | POA: Diagnosis not present

## 2023-09-23 DIAGNOSIS — Z96651 Presence of right artificial knee joint: Secondary | ICD-10-CM | POA: Diagnosis not present

## 2023-09-23 DIAGNOSIS — L03116 Cellulitis of left lower limb: Secondary | ICD-10-CM | POA: Diagnosis not present

## 2023-09-23 DIAGNOSIS — N179 Acute kidney failure, unspecified: Secondary | ICD-10-CM | POA: Diagnosis not present

## 2023-09-23 DIAGNOSIS — I5032 Chronic diastolic (congestive) heart failure: Secondary | ICD-10-CM | POA: Diagnosis not present

## 2023-09-23 DIAGNOSIS — M79605 Pain in left leg: Secondary | ICD-10-CM | POA: Diagnosis not present

## 2023-09-23 DIAGNOSIS — G4733 Obstructive sleep apnea (adult) (pediatric): Secondary | ICD-10-CM | POA: Diagnosis not present

## 2023-09-23 DIAGNOSIS — H409 Unspecified glaucoma: Secondary | ICD-10-CM | POA: Diagnosis not present

## 2023-09-23 DIAGNOSIS — Z8572 Personal history of non-Hodgkin lymphomas: Secondary | ICD-10-CM | POA: Diagnosis not present

## 2023-09-23 DIAGNOSIS — K8020 Calculus of gallbladder without cholecystitis without obstruction: Secondary | ICD-10-CM | POA: Diagnosis not present

## 2023-09-23 DIAGNOSIS — I700 Atherosclerosis of aorta: Secondary | ICD-10-CM | POA: Diagnosis not present

## 2023-09-23 DIAGNOSIS — I480 Paroxysmal atrial fibrillation: Secondary | ICD-10-CM | POA: Diagnosis not present

## 2023-09-23 LAB — BASIC METABOLIC PANEL
Anion gap: 11 (ref 5–15)
BUN: 32 mg/dL — ABNORMAL HIGH (ref 8–23)
CO2: 23 mmol/L (ref 22–32)
Calcium: 9.2 mg/dL (ref 8.9–10.3)
Chloride: 105 mmol/L (ref 98–111)
Creatinine, Ser: 2.6 mg/dL — ABNORMAL HIGH (ref 0.61–1.24)
GFR, Estimated: 24 mL/min — ABNORMAL LOW (ref >60)
Glucose, Bld: 70 mg/dL (ref 70–99)
Potassium: 4.8 mmol/L (ref 3.5–5.1)
Sodium: 139 mmol/L (ref 135–145)

## 2023-09-23 LAB — TROPONIN I (HIGH SENSITIVITY): Troponin I (High Sensitivity): 16 ng/L (ref <18)

## 2023-09-23 LAB — CBC WITH DIFFERENTIAL/PLATELET
Abs Immature Granulocytes: 0.01 10*3/uL (ref 0.00–0.07)
Basophils Absolute: 0.1 10*3/uL (ref 0.0–0.1)
Basophils Relative: 2 %
Eosinophils Absolute: 0.1 10*3/uL (ref 0.0–0.5)
Eosinophils Relative: 3 %
HCT: 30.1 % — ABNORMAL LOW (ref 39.0–52.0)
Hemoglobin: 9.7 g/dL — ABNORMAL LOW (ref 13.0–17.0)
Immature Granulocytes: 0 %
Lymphocytes Relative: 27 %
Lymphs Abs: 1.1 10*3/uL (ref 0.7–4.0)
MCH: 31.5 pg (ref 26.0–34.0)
MCHC: 32.2 g/dL (ref 30.0–36.0)
MCV: 97.7 fL (ref 80.0–100.0)
Monocytes Absolute: 0.6 10*3/uL (ref 0.1–1.0)
Monocytes Relative: 14 %
Neutro Abs: 2.3 10*3/uL (ref 1.7–7.7)
Neutrophils Relative %: 54 %
Platelets: 109 10*3/uL — ABNORMAL LOW (ref 150–400)
RBC: 3.08 MIL/uL — ABNORMAL LOW (ref 4.22–5.81)
RDW: 17.4 % — ABNORMAL HIGH (ref 11.5–15.5)
WBC: 4.2 10*3/uL (ref 4.0–10.5)
nRBC: 0 % (ref 0.0–0.2)

## 2023-09-23 NOTE — ED Triage Notes (Addendum)
PT C/O hypertension. Patient states that he was working with PT today and became short of breath. The SOB has resolved, but PT took his blood pressure and it was in the 200's systolic. Patient has history of HTN, compliant with meds. Denies CP or SOB at this time. Denies dizziness, headache, or visual changes.

## 2023-09-23 NOTE — ED Provider Triage Note (Signed)
Emergency Medicine Provider Triage Evaluation Note  Daniel Kline , a 78 y.o. male  was evaluated in triage.  Pt complains of hypertension, new onset A-fib, denies chest pain/shortness of breath.  Patient was at physical therapy and was sent here.  Review of Systems  Positive:  Negative:   Physical Exam  BP (!) 201/96   Pulse 67   Temp 98.4 F (36.9 C) (Oral)   Resp 18   Ht 5\' 6"  (1.676 m)   Wt 108.9 kg   SpO2 100%   BMI 38.74 kg/m  Gen:   Awake, no distress   Resp:  Normal effort  MSK:   Moves extremities without difficulty  Other:    Medical Decision Making  Medically screening exam initiated at 8:31 PM.  Appropriate orders placed.  [name] was informed that the remainder of the evaluation will be completed by another provider, this initial triage assessment does not replace that evaluation, and the importance of remaining in the ED until their evaluation is complete.     Faythe Ghee, PA-C 09/23/23 2031

## 2023-09-24 ENCOUNTER — Emergency Department
Admission: EM | Admit: 2023-09-24 | Discharge: 2023-09-24 | Disposition: A | Discharge status: Home / Self Care | Payer: Medicare Other | Attending: Emergency Medicine | Admitting: Emergency Medicine

## 2023-09-24 ENCOUNTER — Emergency Department: Payer: Medicare Other

## 2023-09-24 DIAGNOSIS — M79605 Pain in left leg: Secondary | ICD-10-CM | POA: Diagnosis not present

## 2023-09-24 DIAGNOSIS — L03116 Cellulitis of left lower limb: Secondary | ICD-10-CM

## 2023-09-24 DIAGNOSIS — J9811 Atelectasis: Secondary | ICD-10-CM | POA: Diagnosis not present

## 2023-09-24 DIAGNOSIS — R079 Chest pain, unspecified: Secondary | ICD-10-CM | POA: Diagnosis not present

## 2023-09-24 DIAGNOSIS — I10 Essential (primary) hypertension: Secondary | ICD-10-CM

## 2023-09-24 DIAGNOSIS — I700 Atherosclerosis of aorta: Secondary | ICD-10-CM | POA: Diagnosis not present

## 2023-09-24 LAB — TROPONIN I (HIGH SENSITIVITY): Troponin I (High Sensitivity): 17 ng/L (ref <18)

## 2023-09-24 MED ORDER — CEPHALEXIN 500 MG PO CAPS: 500.0000 mg | ORAL_CAPSULE | Freq: Four times a day (QID) | ORAL | 0 refills | Status: AC

## 2023-09-24 MED ORDER — CARVEDILOL 6.25 MG PO TABS
3.1250 mg | ORAL_TABLET | Freq: Once | ORAL | Status: AC
  Administered 2023-09-24: 3.125 mg via ORAL
  Filled 2023-09-24: qty 1

## 2023-09-24 MED ORDER — CEPHALEXIN 500 MG PO CAPS
500.0000 mg | ORAL_CAPSULE | Freq: Once | ORAL | Status: AC
  Administered 2023-09-24: 500 mg via ORAL
  Filled 2023-09-24: qty 1

## 2023-09-24 MED ORDER — AMLODIPINE BESYLATE 5 MG PO TABS
5.0000 mg | ORAL_TABLET | Freq: Once | ORAL | Status: AC
  Administered 2023-09-24: 5 mg via ORAL
  Filled 2023-09-24: qty 1

## 2023-09-24 NOTE — ED Provider Notes (Signed)
[facility] Provider Note    Event Date/Time   First MD Initiated Contact with Patient 09/24/23 0131     (approximate)   History   Hypertension (Pt sent by PCP d/t HTN. Pt states hx of HTN. )   HPI  Daniel Kline is a 78 y.o. male with history of hypertension on amlodipine and Coreg, chronic kidney disease, diabetes, hyperlipidemia, a fib/a flutter status post ablation in 2018 who presents to the emergency department with hypertension.  He states that he was sent here from physical therapy due to hypertension.  He cannot recall what his blood pressure was.  He states that the physical therapist was also concerned because she thought he was short of breath the patient denies having any shortness of breath or chest discomfort.  He denies any headache, vision changes, numbness, tingling or weakness.  He reports he took his amlodipine yesterday morning and his Coreg yesterday morning but did not have his nightly dose of Coreg.  Patient also reports that he injured his left shin about a week ago.  He is having redness, warmth and swelling here and now having some discomfort in the left inguinal area.  He thinks he could have pulled a muscle.  He denies any history of PE or DVT.  Wife reports his tetanus vaccine is up-to-date.  He has not had any fever.  Patient reports he just saw his PCP on 08/16/2023.  At that time blood pressure was 138/88.  Patient states that he was not told by physical therapy to come to the ER, he would not be here as he is not having any symptoms currently or previously despite previous triage notes.  History provided by patient, wife.    Past Medical History:  Diagnosis Date   Anemia    H/O   Anxiety    Arthritis    Atrial flutter (HCC)    a. s/p post ablation in 04/2017   Chronic kidney disease    Complication of anesthesia    CTCL (cutaneous T-cell lymphoma) (HCC)    Diabetes mellitus without complication (HCC)    Diastolic  dysfunction    a. 03/2022 Echo: EF 60-65%, no rwma, GrI DD, nl RV fxn.   Dysplastic nevus 12/19/2017   Right distal lat. forearm near wrist. Severe atypia, close to peripheral margin.   Dysplastic nevus 06/21/2018   Upper back right paraspinal. Severe atypia, peripheral margin involved. Excised 07/11/2018, margins free.   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 forearm. SCCis, hypertrophic.    Past Surgical History:  Procedure Laterality Date   BACK SURGERY     LUMBAR   CARDIAC ELECTROPHYSIOLOGY STUDY AND ABLATION  2019   CATARACT EXTRACTION W/PHACO Right 05/05/2022   Procedure: CATARACT EXTRACTION PHACO AND INTRAOCULAR LENS PLACEMENT (IOC) RIGHT;  Surgeon: Lockie Mola, MD;  Location: [facility];  Service: Ophthalmology;  Laterality: Right;  Diabetic 10.42 01:13.4  COLONOSCOPY WITH PROPOFOL N/A 04/04/2019   Procedure: COLONOSCOPY WITH PROPOFOL;  Surgeon: Toledo, Boykin Nearing, MD;  Location: [facility];  Service: Gastroenterology;  Laterality: N/A;   I & D EXTREMITY Right 02/01/2020   Procedure: IRRIGATION AND DEBRIDEMENT EXTREMITY with poly exchange;  Surgeon: Donato Heinz, MD;  Location: [facility];  Service: Orthopedics;  Laterality: Right;   INCISION AND DRAINAGE     BACK-MRSA INFECTION AFTER BACK SURGERY   KNEE ARTHROPLASTY Right 01/28/2020   Procedure: COMPUTER ASSISTED TOTAL KNEE ARTHROPLASTY;  Surgeon: Donato Heinz, MD;  Location: [facility];  Service: Orthopedics;  Laterality: Right;    MOUTH SURGERY     right elbow surgery     right knee surgery     right shoulder surgery     from polio damage   TONSILLECTOMY     TOTAL HIP ARTHROPLASTY Bilateral 04/2016    MEDICATIONS:  Prior to Admission medications   Medication Sig Start Date End Date Taking? Authorizing Provider  amLODipine (NORVASC) 5 MG tablet Take 1 tablet (5 mg total) by mouth daily. 08/11/23   [name], MD  bismuth subsalicylate (PEPTO BISMOL) 262 MG/15ML suspension Take 30 mLs by mouth every 6 (six) hours as needed for indigestion or diarrhea or loose stools.    [provider]  calcitRIOL (ROCALTROL) 0.25 MCG capsule Take 0.25 mcg by mouth daily. 06/15/23 06/14/24  [provider]  calcium carbonate (OSCAL) 1500 (600 Ca) MG TABS tablet Take 600 mg of elemental calcium by mouth daily with breakfast.    [provider]  carvedilol (COREG) 3.125 MG tablet TAKE 1 TABLET BY MOUTH 2 TIMES DAILY WITH A MEAL. 04/21/23   [name], MD  Cholecalciferol (VITAMIN D-3) 25 MCG (1000 UT) CAPS Take 1,000 Units by mouth daily.    [provider]  clorazepate (TRANXENE) 7.5 MG tablet TAKE 1 TABLET BY MOUTH TWICE A DAY AS NEEDED FOR ANXIETY 09/02/23   [name], MD  cyanocobalamin (VITAMIN B12) 500 MCG tablet Take 1,000 mcg by mouth daily.    [provider]  EPINEPHrine 0.3 mg/0.3 mL IJ SOAJ injection Inject 0.3 mg into the muscle as needed for anaphylaxis. 01/27/23   [name], MD  Ferrous Sulfate (IRON PO) Take 1 tablet by mouth at bedtime.    [provider]  folic acid (FOLVITE) 1 MG tablet Take 1 mg by mouth daily. Every day except day that he takes methotrexate 06/11/20   [provider]  furosemide (LASIX) 40 MG tablet Take 1 tablet (40 mg total) by mouth daily. 11/25/22   Creig Hines, NP  gabapentin (NEURONTIN) 300 MG capsule TAKE 1 CAPSULE BY MOUTH 3 TIMES  DAILY 01/31/23   [name], MD  glipiZIDE (GLUCOTROL XL) 5  MG 24 hr tablet Take 1 tablet (5 mg total) by mouth daily with breakfast. 08/16/23   Tillman Abide I, MD  glucose blood (ONETOUCH ULTRA) test strip 1 each by Other route daily. Use to check blood sugar once daily 10/19/22   Tillman Abide I, MD  HYDROcodone-acetaminophen (NORCO/VICODIN) 5-325 MG tablet TAKE 1 TABLET BY MOUTH EVERY 6 HOURS AS NEEDED FOR PAIN 09/15/23   Tillman Abide I, MD  hydrocortisone 2.5 % cream Apply topically 3 (three) times daily as needed. 06/17/23   [name], MD  hydrOXYzine (ATARAX) 25 MG tablet TAKE ONE TABLET EVERY EIGHT HOURS AS NEEDED 03/16/23   Tillman Abide I, MD  isosorbide mononitrate (IMDUR) 60 MG 24  hr tablet Take 1 tablet (60 mg total) by mouth daily before lunch. 05/11/21   Antonieta Iba, MD  latanoprost (XALATAN) 0.005 % ophthalmic solution Place 1 drop into both eyes at bedtime.    [provider]  losartan (COZAAR) 100 MG tablet TAKE 1 TABLET BY MOUTH DAILY 04/21/23   Antonieta Iba, MD  MAGNESIUM PO Take 1 tablet by mouth daily.    [provider]  methotrexate (RHEUMATREX) 2.5 MG tablet Take 5 mg by mouth once a week. 07/12/23   [provider]  rosuvastatin (CRESTOR) 10 MG tablet Take 1 tablet (10 mg total) by mouth daily. 04/07/23   [name], MD  spironolactone (ALDACTONE) 25 MG tablet Take 25 mg by mouth daily.    [provider]  timolol (TIMOPTIC) 0.5 % ophthalmic solution Place 1 drop into both eyes 2 (two) times daily. 01/11/20   [provider]    Physical Exam   Triage Vital Signs: ED Triage Vitals  Encounter Vitals Group     BP 09/23/23 1957 (!) 208/120     Systolic BP Percentile --      Diastolic BP Percentile --      Pulse Rate 09/23/23 1957 67     Resp 09/23/23 1957 18     Temp 09/23/23 1957 98.4 F (36.9 C)     Temp Source 09/23/23 1957 Oral     SpO2 09/23/23 1957 100 %     Weight 09/23/23 1953 240 lb (108.9 kg)     Height 09/23/23 1953 5\' 6"  (1.676 m)     Head  Circumference --      Peak Flow --      Pain Score 09/24/23 0059 4     Pain Loc --      Pain Education --      Exclude from Growth Chart --     Most recent vital signs: Vitals:   09/24/23 0346 09/24/23 0400  BP: (!) 163/79 (!) 150/68  Pulse: 65 (!) 55  Resp: 18   Temp: 98.1 F (36.7 C)   SpO2: 98% 99%    CONSTITUTIONAL: Alert, responds appropriately to questions. Well-appearing; well-nourished, pleasant, appears younger than stated age HEAD: Normocephalic, atraumatic EYES: Conjunctivae clear, pupils appear equal, sclera nonicteric ENT: normal nose; moist mucous membranes NECK: Supple, normal ROM CARD: Irregular and rate controlled; S1 and S2 appreciated RESP: Normal chest excursion without splinting or tachypnea; breath sounds clear and equal bilaterally; no wheezes, no rhonchi, no rales, no hypoxia or respiratory distress, speaking full sentences ABD/GI: Non-distended; soft, non-tender, no rebound, no guarding, no peritoneal signs BACK: The back appears normal EXT: Patient has redness, warmth and a superficial scab lesion to the anterior lateral portion of the shin of the left lower extremity.  This extremity is warm well-perfused but he does have some mild Tenderness and swelling compared to the right leg.  Compartments are soft.  2+ DP pulse on exam.  Normal capillary refill. SKIN: Normal color for age and race; warm; no rash on exposed skin NEURO: Moves all extremities equally, normal speech PSYCH: The patient's mood and manner are appropriate.     Patient gave verbal permission to utilize photo for medical documentation only. The image was not stored on any personal device.   ED Results / Procedures / Treatments   LABS: (all labs ordered are listed, but only abnormal results are displayed) Labs Reviewed  CBC WITH DIFFERENTIAL/PLATELET - Abnormal; Notable for the following components:  Result Value   RBC 3.08 (*)    Hemoglobin 9.7 (*)    HCT 30.1 (*)    RDW  17.4 (*)    Platelets 109 (*)    All other components within normal limits  BASIC METABOLIC PANEL - Abnormal; Notable for the following components:   BUN 32 (*)    Creatinine, Ser 2.60 (*)    GFR, Estimated 24 (*)    All other components within normal limits  TROPONIN I (HIGH SENSITIVITY)  TROPONIN I (HIGH SENSITIVITY)     EKG:  EKG Interpretation Date/Time:  Friday September 23 2023 20:12:07 EST Ventricular Rate:  61 PR Interval:    QRS Duration:  82 QT Interval:  408 QTC Calculation: 410 R Axis:   17  Text Interpretation: nsr with pacs Abnormal ECG When compared with ECG of 29-Jul-2023 02:22, PREVIOUS ECG IS PRESENT Reconfirmed by Rochele Raring [phone]) on 09/24/2023 12:21:51 PM         RADIOLOGY: My personal review and interpretation of imaging: Chest x-ray clear but shows possible thoracic aortic aneurysm.  CT chest shows no aneurysm or other acute abnormality.  Venous Doppler of the left leg shows no DVT.  I have personally reviewed all radiology reports.   CT CHEST WO CONTRAST Result Date: 09/24/2023 CLINICAL DATA:  Hypertension and chest pain, initial encounter EXAM: CT CHEST WITHOUT CONTRAST TECHNIQUE: Multidetector CT imaging of the chest was performed following the standard protocol without IV contrast. RADIATION DOSE REDUCTION: This exam was performed according to the departmental dose-optimization program which includes automated exposure control, adjustment of the mA and/or kV according to patient size and/or use of iterative reconstruction technique. COMPARISON:  Chest x-ray from the previous day. FINDINGS: Cardiovascular: Atherosclerotic calcifications of the thoracic aorta are noted. No significant aneurysmal dilatation is noted. Mild tortuosity of the aorta is seen. Coronary calcifications are noted. Heart is at the upper limits of normal in size. Mediastinum/Nodes: Thoracic inlet is within normal limits. No hilar or mediastinal adenopathy is noted. The esophagus as  visualized is within normal limits. Lungs/Pleura: Lungs are well aerated bilaterally. No focal infiltrate is seen. Mild right basilar atelectasis is noted. Upper Abdomen: Visualized upper abdomen shows gallstones without complicating factors. Musculoskeletal: Degenerative changes of the thoracic spine are noted. IMPRESSION: No evidence of thoracic aortic aneurysm. Mild tortuosity is noted which accounts for the abnormality on prior plain film. Mild right basilar atelectasis is noted. Electronically Signed   By: Alcide Clever M.D.   On: 09/24/2023 03:06   US Venous Img Lower Unilateral Left Result Date: 09/24/2023 CLINICAL DATA:  leg pain EXAM: LEFT LOWER EXTREMITY VENOUS DOPPLER ULTRASOUND TECHNIQUE: Gray-scale sonography with compression, as well as color and duplex ultrasound, were performed to evaluate the deep venous system(s) from the level of the common femoral vein through the popliteal and proximal calf veins. COMPARISON:  None Available. FINDINGS: VENOUS Normal compressibility of the common femoral, superficial femoral, and popliteal veins, as well as the visualized calf veins. Visualized portions of profunda femoral vein and great saphenous vein unremarkable. No filling defects to suggest DVT on grayscale or color Doppler imaging. Doppler waveforms show normal direction of venous flow, normal respiratory plasticity and response to augmentation. Limited views of the contralateral common femoral vein are unremarkable. IMPRESSION: No evidence of DVT in the left lower extremity. Electronically Signed   By: Feliberto Harts M.D.   On: 09/24/2023 02:41   DG Chest Portable 1 View Result Date: 09/23/2023 CLINICAL DATA:  Hypertension EXAM: PORTABLE CHEST  1 VIEW COMPARISON:  09/05/2022 FINDINGS: Lungs are clear. No pneumothorax or pleural effusion. Cardiac size is at the upper limits of normal. Thoracic aorta is aneurysmal, not well characterized on this examination. Pulmonary vascularity is normal. No acute  bone abnormality. Postsurgical changes are seen within the proximal right humerus. Advanced degenerative changes are seen within the left shoulder. IMPRESSION: 1. No active disease. 2. Thoracic aortic aneurysm, not well characterized on this examination. This would be better assessed with dedicated CT arteriography if indicated. Electronically Signed   By: Helyn Numbers M.D.   On: 09/23/2023 21:37     PROCEDURES:  Critical Care performed: No      Procedures    IMPRESSION / MDM / ASSESSMENT AND PLAN / ED COURSE  I reviewed the triage vital signs and the nursing notes.    Patient here with asymptomatic hypertension.  Also appears to have cellulitis of the left lower extremity.  The patient is on the cardiac monitor to evaluate for evidence of arrhythmia and/or significant heart rate changes.   DIFFERENTIAL DIAGNOSIS (includes but not limited to):   Asymptomatic hypertension, worsening kidney disease, medication noncompliance, doubt hypertensive urgency/emergency, ACS, dissection, PE, CHF.  Cellulitis, DVT, no sign of compartment syndrome, arterial obstruction   Patient's presentation is most consistent with acute presentation with potential threat to life or bodily function.   PLAN: Workup initiated from triage.  Patient has stable chronic kidney disease and stable anemia.  Troponin negative x 2.  Chest x-ray reviewed and interpreted by myself and the radiologist and is concerning for possible thoracic aortic aneurysm.  Low suspicion for rupture, dissection but will obtain CT of the chest without contrast to evaluate further.  Given the cellulitis of the left lower extremity with increased swelling in that calf and now pain in the anterior thigh, will obtain Doppler to rule out DVT.  Will give dose of Keflex here.  Patient reports he has had cellulitis before and Keflex helped him significantly.  Wife reports his tetanus vaccine is up-to-date.  No bony tenderness or bony abnormality  on exam and he is ambulatory.   MEDICATIONS GIVEN IN ED: Medications  carvedilol (COREG) tablet 3.125 mg (3.125 mg Oral Given 09/24/23 0220)  amLODipine (NORVASC) tablet 5 mg (5 mg Oral Given 09/24/23 0221)  cephALEXin (KEFLEX) capsule 500 mg (500 mg Oral Given 09/24/23 0221)     ED COURSE: CT chest reviewed and interpreted by myself and the radiologist and shows no aneurysm.  No acute abnormality seen.  Patient is very reassured by this.  Doppler shows no DVT.  Blood pressures have improved to 150s/60s and he continues to be asymptomatic.  There is a note that A-fib is new for him however he has a documented history of atrial flutter status post ablation in 2018 and has recently been seen by cardiology on 07/29/2023 with an EKG that showed sinus rhythm with PACs.  They did not recommend any further cardiac testing at that time.  Repeat EKG today looks very similar to EKGs in December - NSR with PACs.  No indication to start him on anticoagulation.  He is already on a beta-blocker.  Given patient has no signs or symptoms of sepsis today and is ambulatory, I feel he is safe for discharge with oral antibiotics for his cellulitis and close follow-up with his PCP for further monitoring of his blood pressure.  I do not think we should adjust any of his blood pressure medications at this time from the ED given  just a month ago his blood pressure was normal on his current regimen.  Patient and wife are comfortable with this plan.  At this time, I do not feel there is any life-threatening condition present. I reviewed all nursing notes, vitals, pertinent previous records.  All lab and urine results, EKGs, imaging ordered have been independently reviewed and interpreted by myself.  I reviewed all available radiology reports from any imaging ordered this visit.  Based on my assessment, I feel the patient is safe to be discharged home without further emergent workup and can continue workup as an outpatient as  needed. Discussed all findings, treatment plan as well as usual and customary return precautions.  They verbalize understanding and are comfortable with this plan.  Outpatient follow-up has been provided as needed.  All questions have been answered.    CONSULTS: Admission considered but patient is asymptomatic from a hypertensive standpoint and denies chest pain or shortness of breath despite triage notes.  He states that the physical therapist thought he looked short of breath although he states he was not having any symptoms.  Blood pressures have improved with oral medications here.  He was only given a small extra dose of his amlodipine and his regular nightly dose of Coreg.  He does have cellulitis of the left lower extremity but no DVT, arterial obstruction, compartment syndrome.  He is ambulatory.  No signs or symptoms of sepsis.  He is a diabetic but blood sugar currently well-controlled (70 on labs today).  I feel he is appropriate for outpatient treatment with Keflex given this has worked well for him in the past.   OUTSIDE RECORDS REVIEWED: Reviewed recent PCP notes.  Reviewed previous cardiology notes.       FINAL CLINICAL IMPRESSION(S) / ED DIAGNOSES   Final diagnoses:  Hypertension, unspecified type  Cellulitis of left lower extremity     Rx / DC Orders   ED Discharge Orders          Ordered    cephALEXin (KEFLEX) 500 MG capsule  4 times daily        09/24/23 0400             Note:  This document was prepared using Dragon voice recognition software and may include unintentional dictation errors.   Ramzi Brathwaite, Layla Maw, DO 09/24/23 1224

## 2023-09-24 NOTE — ED Notes (Signed)
 Pt to CT

## 2023-09-26 ENCOUNTER — Telehealth: Payer: Self-pay

## 2023-09-26 ENCOUNTER — Telehealth: Payer: Self-pay | Admitting: Internal Medicine

## 2023-09-26 DIAGNOSIS — I130 Hypertensive heart and chronic kidney disease with heart failure and stage 1 through stage 4 chronic kidney disease, or unspecified chronic kidney disease: Secondary | ICD-10-CM | POA: Diagnosis not present

## 2023-09-26 DIAGNOSIS — H409 Unspecified glaucoma: Secondary | ICD-10-CM | POA: Diagnosis not present

## 2023-09-26 DIAGNOSIS — E1122 Type 2 diabetes mellitus with diabetic chronic kidney disease: Secondary | ICD-10-CM | POA: Diagnosis not present

## 2023-09-26 DIAGNOSIS — K8020 Calculus of gallbladder without cholecystitis without obstruction: Secondary | ICD-10-CM | POA: Diagnosis not present

## 2023-09-26 DIAGNOSIS — N179 Acute kidney failure, unspecified: Secondary | ICD-10-CM | POA: Diagnosis not present

## 2023-09-26 DIAGNOSIS — Z8572 Personal history of non-Hodgkin lymphomas: Secondary | ICD-10-CM | POA: Diagnosis not present

## 2023-09-26 DIAGNOSIS — D631 Anemia in chronic kidney disease: Secondary | ICD-10-CM | POA: Diagnosis not present

## 2023-09-26 DIAGNOSIS — G8929 Other chronic pain: Secondary | ICD-10-CM | POA: Diagnosis not present

## 2023-09-26 DIAGNOSIS — I480 Paroxysmal atrial fibrillation: Secondary | ICD-10-CM | POA: Diagnosis not present

## 2023-09-26 DIAGNOSIS — D75839 Thrombocytosis, unspecified: Secondary | ICD-10-CM | POA: Diagnosis not present

## 2023-09-26 DIAGNOSIS — M545 Low back pain, unspecified: Secondary | ICD-10-CM | POA: Diagnosis not present

## 2023-09-26 DIAGNOSIS — I491 Atrial premature depolarization: Secondary | ICD-10-CM | POA: Diagnosis not present

## 2023-09-26 DIAGNOSIS — E785 Hyperlipidemia, unspecified: Secondary | ICD-10-CM | POA: Diagnosis not present

## 2023-09-26 DIAGNOSIS — Z96651 Presence of right artificial knee joint: Secondary | ICD-10-CM | POA: Diagnosis not present

## 2023-09-26 DIAGNOSIS — G4733 Obstructive sleep apnea (adult) (pediatric): Secondary | ICD-10-CM | POA: Diagnosis not present

## 2023-09-26 DIAGNOSIS — E876 Hypokalemia: Secondary | ICD-10-CM | POA: Diagnosis not present

## 2023-09-26 DIAGNOSIS — N183 Chronic kidney disease, stage 3 unspecified: Secondary | ICD-10-CM | POA: Diagnosis not present

## 2023-09-26 DIAGNOSIS — Z7952 Long term (current) use of systemic steroids: Secondary | ICD-10-CM | POA: Diagnosis not present

## 2023-09-26 DIAGNOSIS — Z96643 Presence of artificial hip joint, bilateral: Secondary | ICD-10-CM | POA: Diagnosis not present

## 2023-09-26 DIAGNOSIS — I4892 Unspecified atrial flutter: Secondary | ICD-10-CM | POA: Diagnosis not present

## 2023-09-26 DIAGNOSIS — Z85828 Personal history of other malignant neoplasm of skin: Secondary | ICD-10-CM | POA: Diagnosis not present

## 2023-09-26 DIAGNOSIS — I5032 Chronic diastolic (congestive) heart failure: Secondary | ICD-10-CM | POA: Diagnosis not present

## 2023-09-26 NOTE — Telephone Encounter (Signed)
Per chart review tab pt was seen [facility] ED on 09/24/23. Sending note to Dr Alphonsus Sias.

## 2023-09-26 NOTE — Telephone Encounter (Signed)
Home Health verbal orders Caller Name: Angelica Chessman  Agency Name: [facility] number: [phone]  Requesting VO fro Wound Care  Reason: Skin tear to left calf, wants to use silver alginate and foam boarders  Frequency: 2x a week, wasn't sure for how many weeks  Please forward to [facility] pool or providers CMA

## 2023-09-26 NOTE — Telephone Encounter (Signed)
Verbal orders given to [facility].

## 2023-09-26 NOTE — Telephone Encounter (Signed)
 Daniel Kline

## 2023-09-26 NOTE — Telephone Encounter (Signed)
Evaluation done and Rx started for cellulitis Will await our phone follow up per protocol

## 2023-09-27 MED ORDER — ONETOUCH VERIO VI STRP: ORAL_STRIP | 12 refills | Status: AC

## 2023-09-27 MED ORDER — ONETOUCH DELICA PLUS LANCET33G MISC
1.0000 | Freq: Every day | 3 refills | Status: DC
Start: 2023-09-27 — End: 2023-10-04

## 2023-09-27 MED ORDER — ONETOUCH VERIO W/DEVICE KIT: 1.0000 | PACK | Freq: Once | 0 refills | Status: AC

## 2023-09-27 MED ORDER — ONETOUCH DELICA LANCING DEV MISC: 1.0000 | Freq: Once | 0 refills | Status: AC

## 2023-09-27 NOTE — Telephone Encounter (Signed)
 Rx sent electronically.

## 2023-09-28 ENCOUNTER — Other Ambulatory Visit (HOSPITAL_COMMUNITY): Payer: Self-pay

## 2023-09-28 DIAGNOSIS — D75839 Thrombocytosis, unspecified: Secondary | ICD-10-CM | POA: Diagnosis not present

## 2023-09-28 DIAGNOSIS — I491 Atrial premature depolarization: Secondary | ICD-10-CM | POA: Diagnosis not present

## 2023-09-28 DIAGNOSIS — Z7952 Long term (current) use of systemic steroids: Secondary | ICD-10-CM | POA: Diagnosis not present

## 2023-09-28 DIAGNOSIS — Z96651 Presence of right artificial knee joint: Secondary | ICD-10-CM | POA: Diagnosis not present

## 2023-09-28 DIAGNOSIS — I480 Paroxysmal atrial fibrillation: Secondary | ICD-10-CM | POA: Diagnosis not present

## 2023-09-28 DIAGNOSIS — G4733 Obstructive sleep apnea (adult) (pediatric): Secondary | ICD-10-CM | POA: Diagnosis not present

## 2023-09-28 DIAGNOSIS — E1122 Type 2 diabetes mellitus with diabetic chronic kidney disease: Secondary | ICD-10-CM | POA: Diagnosis not present

## 2023-09-28 DIAGNOSIS — M545 Low back pain, unspecified: Secondary | ICD-10-CM | POA: Diagnosis not present

## 2023-09-28 DIAGNOSIS — Z85828 Personal history of other malignant neoplasm of skin: Secondary | ICD-10-CM | POA: Diagnosis not present

## 2023-09-28 DIAGNOSIS — I5032 Chronic diastolic (congestive) heart failure: Secondary | ICD-10-CM | POA: Diagnosis not present

## 2023-09-28 DIAGNOSIS — G8929 Other chronic pain: Secondary | ICD-10-CM | POA: Diagnosis not present

## 2023-09-28 DIAGNOSIS — N179 Acute kidney failure, unspecified: Secondary | ICD-10-CM | POA: Diagnosis not present

## 2023-09-28 DIAGNOSIS — H409 Unspecified glaucoma: Secondary | ICD-10-CM | POA: Diagnosis not present

## 2023-09-28 DIAGNOSIS — N183 Chronic kidney disease, stage 3 unspecified: Secondary | ICD-10-CM | POA: Diagnosis not present

## 2023-09-28 DIAGNOSIS — E876 Hypokalemia: Secondary | ICD-10-CM | POA: Diagnosis not present

## 2023-09-28 DIAGNOSIS — E785 Hyperlipidemia, unspecified: Secondary | ICD-10-CM | POA: Diagnosis not present

## 2023-09-28 DIAGNOSIS — D631 Anemia in chronic kidney disease: Secondary | ICD-10-CM | POA: Diagnosis not present

## 2023-09-28 DIAGNOSIS — Z96643 Presence of artificial hip joint, bilateral: Secondary | ICD-10-CM | POA: Diagnosis not present

## 2023-09-28 DIAGNOSIS — Z8572 Personal history of non-Hodgkin lymphomas: Secondary | ICD-10-CM | POA: Diagnosis not present

## 2023-09-28 DIAGNOSIS — I130 Hypertensive heart and chronic kidney disease with heart failure and stage 1 through stage 4 chronic kidney disease, or unspecified chronic kidney disease: Secondary | ICD-10-CM | POA: Diagnosis not present

## 2023-09-28 DIAGNOSIS — I4892 Unspecified atrial flutter: Secondary | ICD-10-CM | POA: Diagnosis not present

## 2023-09-28 DIAGNOSIS — K8020 Calculus of gallbladder without cholecystitis without obstruction: Secondary | ICD-10-CM | POA: Diagnosis not present

## 2023-09-30 DIAGNOSIS — D631 Anemia in chronic kidney disease: Secondary | ICD-10-CM | POA: Diagnosis not present

## 2023-09-30 DIAGNOSIS — I4892 Unspecified atrial flutter: Secondary | ICD-10-CM | POA: Diagnosis not present

## 2023-09-30 DIAGNOSIS — E1122 Type 2 diabetes mellitus with diabetic chronic kidney disease: Secondary | ICD-10-CM | POA: Diagnosis not present

## 2023-09-30 DIAGNOSIS — M545 Low back pain, unspecified: Secondary | ICD-10-CM | POA: Diagnosis not present

## 2023-09-30 DIAGNOSIS — Z7952 Long term (current) use of systemic steroids: Secondary | ICD-10-CM | POA: Diagnosis not present

## 2023-09-30 DIAGNOSIS — Z96651 Presence of right artificial knee joint: Secondary | ICD-10-CM | POA: Diagnosis not present

## 2023-09-30 DIAGNOSIS — I480 Paroxysmal atrial fibrillation: Secondary | ICD-10-CM | POA: Diagnosis not present

## 2023-09-30 DIAGNOSIS — D75839 Thrombocytosis, unspecified: Secondary | ICD-10-CM | POA: Diagnosis not present

## 2023-09-30 DIAGNOSIS — E785 Hyperlipidemia, unspecified: Secondary | ICD-10-CM | POA: Diagnosis not present

## 2023-09-30 DIAGNOSIS — Z96643 Presence of artificial hip joint, bilateral: Secondary | ICD-10-CM | POA: Diagnosis not present

## 2023-09-30 DIAGNOSIS — N179 Acute kidney failure, unspecified: Secondary | ICD-10-CM | POA: Diagnosis not present

## 2023-09-30 DIAGNOSIS — I5032 Chronic diastolic (congestive) heart failure: Secondary | ICD-10-CM | POA: Diagnosis not present

## 2023-09-30 DIAGNOSIS — K8020 Calculus of gallbladder without cholecystitis without obstruction: Secondary | ICD-10-CM | POA: Diagnosis not present

## 2023-09-30 DIAGNOSIS — I491 Atrial premature depolarization: Secondary | ICD-10-CM | POA: Diagnosis not present

## 2023-09-30 DIAGNOSIS — Z85828 Personal history of other malignant neoplasm of skin: Secondary | ICD-10-CM | POA: Diagnosis not present

## 2023-09-30 DIAGNOSIS — H409 Unspecified glaucoma: Secondary | ICD-10-CM | POA: Diagnosis not present

## 2023-09-30 DIAGNOSIS — G4733 Obstructive sleep apnea (adult) (pediatric): Secondary | ICD-10-CM | POA: Diagnosis not present

## 2023-09-30 DIAGNOSIS — N183 Chronic kidney disease, stage 3 unspecified: Secondary | ICD-10-CM | POA: Diagnosis not present

## 2023-09-30 DIAGNOSIS — I130 Hypertensive heart and chronic kidney disease with heart failure and stage 1 through stage 4 chronic kidney disease, or unspecified chronic kidney disease: Secondary | ICD-10-CM | POA: Diagnosis not present

## 2023-09-30 DIAGNOSIS — Z8572 Personal history of non-Hodgkin lymphomas: Secondary | ICD-10-CM | POA: Diagnosis not present

## 2023-09-30 DIAGNOSIS — E876 Hypokalemia: Secondary | ICD-10-CM | POA: Diagnosis not present

## 2023-09-30 DIAGNOSIS — G8929 Other chronic pain: Secondary | ICD-10-CM | POA: Diagnosis not present

## 2023-10-04 ENCOUNTER — Ambulatory Visit: Payer: Medicare Other | Admitting: Urology

## 2023-10-04 VITALS — BP 196/98 | HR 80 | Ht 66.0 in | Wt 242.0 lb

## 2023-10-04 DIAGNOSIS — Z85828 Personal history of other malignant neoplasm of skin: Secondary | ICD-10-CM | POA: Diagnosis not present

## 2023-10-04 DIAGNOSIS — E876 Hypokalemia: Secondary | ICD-10-CM | POA: Diagnosis not present

## 2023-10-04 DIAGNOSIS — M545 Low back pain, unspecified: Secondary | ICD-10-CM | POA: Diagnosis not present

## 2023-10-04 DIAGNOSIS — K8020 Calculus of gallbladder without cholecystitis without obstruction: Secondary | ICD-10-CM | POA: Diagnosis not present

## 2023-10-04 DIAGNOSIS — Z96651 Presence of right artificial knee joint: Secondary | ICD-10-CM | POA: Diagnosis not present

## 2023-10-04 DIAGNOSIS — I480 Paroxysmal atrial fibrillation: Secondary | ICD-10-CM | POA: Diagnosis not present

## 2023-10-04 DIAGNOSIS — D75839 Thrombocytosis, unspecified: Secondary | ICD-10-CM | POA: Diagnosis not present

## 2023-10-04 DIAGNOSIS — I130 Hypertensive heart and chronic kidney disease with heart failure and stage 1 through stage 4 chronic kidney disease, or unspecified chronic kidney disease: Secondary | ICD-10-CM | POA: Diagnosis not present

## 2023-10-04 DIAGNOSIS — I491 Atrial premature depolarization: Secondary | ICD-10-CM | POA: Diagnosis not present

## 2023-10-04 DIAGNOSIS — I4892 Unspecified atrial flutter: Secondary | ICD-10-CM | POA: Diagnosis not present

## 2023-10-04 DIAGNOSIS — N2889 Other specified disorders of kidney and ureter: Secondary | ICD-10-CM | POA: Diagnosis not present

## 2023-10-04 DIAGNOSIS — G8929 Other chronic pain: Secondary | ICD-10-CM | POA: Diagnosis not present

## 2023-10-04 DIAGNOSIS — G4733 Obstructive sleep apnea (adult) (pediatric): Secondary | ICD-10-CM | POA: Diagnosis not present

## 2023-10-04 DIAGNOSIS — N183 Chronic kidney disease, stage 3 unspecified: Secondary | ICD-10-CM | POA: Diagnosis not present

## 2023-10-04 DIAGNOSIS — N179 Acute kidney failure, unspecified: Secondary | ICD-10-CM | POA: Diagnosis not present

## 2023-10-04 DIAGNOSIS — E1122 Type 2 diabetes mellitus with diabetic chronic kidney disease: Secondary | ICD-10-CM | POA: Diagnosis not present

## 2023-10-04 DIAGNOSIS — Z7952 Long term (current) use of systemic steroids: Secondary | ICD-10-CM | POA: Diagnosis not present

## 2023-10-04 DIAGNOSIS — E785 Hyperlipidemia, unspecified: Secondary | ICD-10-CM | POA: Diagnosis not present

## 2023-10-04 DIAGNOSIS — H409 Unspecified glaucoma: Secondary | ICD-10-CM | POA: Diagnosis not present

## 2023-10-04 DIAGNOSIS — I5032 Chronic diastolic (congestive) heart failure: Secondary | ICD-10-CM | POA: Diagnosis not present

## 2023-10-04 DIAGNOSIS — Z8572 Personal history of non-Hodgkin lymphomas: Secondary | ICD-10-CM | POA: Diagnosis not present

## 2023-10-04 DIAGNOSIS — Z96643 Presence of artificial hip joint, bilateral: Secondary | ICD-10-CM | POA: Diagnosis not present

## 2023-10-04 DIAGNOSIS — D631 Anemia in chronic kidney disease: Secondary | ICD-10-CM | POA: Diagnosis not present

## 2023-10-04 LAB — URINALYSIS, COMPLETE
Bilirubin, UA: NEGATIVE
Ketones, UA: NEGATIVE
Leukocytes,UA: NEGATIVE
Nitrite, UA: NEGATIVE
Specific Gravity, UA: 1.025 (ref 1.005–1.030)
Urobilinogen, Ur: 0.2 mg/dL (ref 0.2–1.0)
pH, UA: 6.5 (ref 5.0–7.5)

## 2023-10-04 LAB — MICROSCOPIC EXAMINATION

## 2023-10-04 NOTE — Progress Notes (Signed)
 I,Amy L Pierron,acting as a scribe for Vanna Scotland, MD.,have documented all relevant documentation on the behalf of Vanna Scotland, MD,as directed by  Vanna Scotland, MD while in the presence of Vanna Scotland, MD.  10/04/2023 4:09 PM   Daniel Kline [birth year] [id number]  Referring provider: Lamont Dowdy, MD [address] [place],  [place] [address]  Chief Complaint  Patient presents with   Establish Care   renal mass    HPI: 78 year-old male presents today for follow up of renal mass.  He had a CT scan without contrast on 07/28/2023, when he presented with nausea, vomiting, and diarrhea, that showed a 2.6 cm dense right mid pole indeterminate lesion.   His baseline creatinine is 2.6 with a GFR of 24.   He mentions he went to the hospital again last week due to having high blood pressure. Otherwise, he is doing well.    PMH: Past Medical History:  Diagnosis Date   Anemia    H/O   Anxiety    Arthritis    Atrial flutter (HCC)    a. s/p post ablation in 04/2017   Chronic kidney disease    Complication of anesthesia    CTCL (cutaneous T-cell lymphoma) (HCC)    Diabetes mellitus without complication (HCC)    Diastolic dysfunction    a. 03/2022 Echo: EF 60-65%, no rwma, GrI DD, nl RV fxn.   Dysplastic nevus 12/19/2017   Right distal lat. forearm near wrist. Severe atypia, close to peripheral margin.   Dysplastic nevus 06/21/2018   Upper back right paraspinal. Severe atypia, peripheral margin involved. Excised 07/11/2018, margins free.   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 forearm. SCCis, hypertrophic.    Surgical History: Past Surgical History:  Procedure Laterality Date   BACK SURGERY     LUMBAR   CARDIAC ELECTROPHYSIOLOGY STUDY AND ABLATION  2019   CATARACT EXTRACTION W/PHACO Right 05/05/2022   Procedure: CATARACT EXTRACTION PHACO AND INTRAOCULAR LENS PLACEMENT (IOC) RIGHT;  Surgeon: Lockie Mola, MD;  Location: [facility];  Service: Ophthalmology;  Laterality: Right;  Diabetic 10.42 01:13.4   COLONOSCOPY WITH PROPOFOL N/A 04/04/2019   Procedure: COLONOSCOPY WITH PROPOFOL;  Surgeon: Toledo, Boykin Nearing, MD;  Location: [facility];  Service: Gastroenterology;  Laterality: N/A;   I & D EXTREMITY Right 02/01/2020   Procedure: IRRIGATION AND DEBRIDEMENT EXTREMITY with poly exchange;  Surgeon: Donato Heinz, MD;  Location: [facility];  Service: Orthopedics;  Laterality: Right;   INCISION AND DRAINAGE     BACK-MRSA INFECTION AFTER BACK SURGERY   KNEE ARTHROPLASTY Right 01/28/2020   Procedure: COMPUTER ASSISTED TOTAL KNEE ARTHROPLASTY;  Surgeon: Donato Heinz, MD;  Location: [facility];  Service: Orthopedics;  Laterality: Right;   MOUTH SURGERY     right elbow surgery     right knee surgery     right shoulder surgery     from  polio damage   TONSILLECTOMY     TOTAL HIP ARTHROPLASTY Bilateral 04/2016    Home Medications:  Allergies as of 10/04/2023       Reactions   Bee Venom Anaphylaxis   Oxycodone Other (See Comments)   Delusions   Hydromorphone Other (See Comments)   hallucinating   Hydroxychloroquine    Other reaction(s): Other (See Comments) He broke out really badly.   Zolpidem Other (See Comments)        Medication List        Accurate as of October 04, 2023  4:09 PM. If you have any questions, ask your nurse or doctor.          STOP taking these medications    bismuth subsalicylate 262 MG/15ML  suspension Commonly known as: PEPTO BISMOL Stopped by: Vanna Scotland   glipiZIDE 5 MG 24 hr tablet Commonly known as: GLUCOTROL XL Stopped by: Vanna Scotland   OneTouch Delica Plus Lancet33G Misc Stopped by: Vanna Scotland   timolol 0.5 % ophthalmic solution Commonly known as: TIMOPTIC Stopped by: Vanna Scotland       TAKE these medications    amLODipine 5 MG tablet Commonly known as: NORVASC Take 1 tablet (5 mg total) by mouth daily.   calcitRIOL 0.25 MCG capsule Commonly known as: ROCALTROL Take 0.25 mcg by mouth daily.   calcium carbonate 1500 (600 Ca) MG Tabs tablet Commonly known as: OSCAL Take 600 mg of elemental calcium by mouth daily with breakfast.   carvedilol 3.125 MG tablet Commonly known as: COREG TAKE 1 TABLET BY MOUTH 2 TIMES DAILY WITH A MEAL.   clorazepate 7.5 MG tablet Commonly known as: TRANXENE TAKE 1 TABLET BY MOUTH TWICE A DAY AS NEEDED FOR ANXIETY   cyanocobalamin 500 MCG tablet Commonly known as: VITAMIN B12 Take 1,000 mcg by mouth daily.   EPINEPHrine 0.3 mg/0.3 mL Soaj injection Commonly known as: EPI-PEN Inject 0.3 mg into the muscle as needed for anaphylaxis.   folic acid 1 MG tablet Commonly known as: FOLVITE Take 1 mg by mouth daily. Every day except day that he takes methotrexate   furosemide 40 MG tablet Commonly known as: LASIX Take 1 tablet (40 mg total) by mouth daily.   gabapentin 300 MG capsule Commonly known as: NEURONTIN TAKE 1 CAPSULE BY MOUTH 3 TIMES  DAILY   HYDROcodone-acetaminophen 5-325 MG tablet Commonly known as: NORCO/VICODIN TAKE 1 TABLET BY MOUTH EVERY 6 HOURS AS NEEDED FOR PAIN   hydrocortisone 2.5 % cream Apply topically 3 (three) times daily as needed.   hydrOXYzine 25 MG tablet Commonly known as: ATARAX TAKE ONE TABLET EVERY EIGHT HOURS AS NEEDED   IRON PO Take 1 tablet by mouth at bedtime.   isosorbide mononitrate 60 MG 24 hr tablet Commonly known as: IMDUR Take 1 tablet (60 mg total)  by mouth daily before lunch.   latanoprost 0.005 % ophthalmic solution Commonly known as: XALATAN Place 1 drop into both eyes at bedtime.   losartan 100 MG tablet Commonly known as: COZAAR TAKE 1 TABLET BY MOUTH DAILY   MAGNESIUM PO Take 1 tablet by mouth daily.   methotrexate 2.5 MG tablet Commonly known as: RHEUMATREX Take 5 mg by mouth once a week.   OneTouch Verio test strip Generic drug: glucose blood Use to check blood sugar once daily   rosuvastatin 10 MG tablet Commonly known as: Crestor Take 1 tablet (10 mg total) by mouth daily.   spironolactone 25 MG tablet Commonly known as: ALDACTONE Take  25 mg by mouth daily.   Vitamin D-3 25 MCG (1000 UT) Caps Take 1,000 Units by mouth daily.        Allergies:  Allergies  Allergen Reactions   Bee Venom Anaphylaxis   Oxycodone Other (See Comments)    Delusions   Hydromorphone Other (See Comments)    hallucinating   Hydroxychloroquine     Other reaction(s): Other (See Comments) He broke out really badly.   Zolpidem Other (See Comments)    Family History: Family History  Adopted: Yes    Social History:  reports that he quit smoking about 33 years ago. His smoking use included cigars. He has been exposed to tobacco smoke. He quit smokeless tobacco use about 17 years ago. He reports that he does not drink alcohol and does not use drugs.   Physical Exam: BP (!) 196/98   Pulse 80   Ht 5\' 6"  (1.676 m)   Wt 242 lb (109.8 kg)   BMI 39.06 kg/m   Constitutional:  Alert and oriented, No acute distress. HEENT: [place] AT, moist mucus membranes.  Trachea midline, no masses. Neurologic: Grossly intact, no focal deficits, moving all 4 extremities. Psychiatric: Normal mood and affect.   Pertinent Imaging: Narrative & Impression  CLINICAL DATA:  Sepsis diarrhea vomiting   EXAM: CT ABDOMEN AND PELVIS WITHOUT CONTRAST   TECHNIQUE: Multidetector CT imaging of the abdomen and pelvis was performed following the  standard protocol without IV contrast.   RADIATION DOSE REDUCTION: This exam was performed according to the departmental dose-optimization program which includes automated exposure control, adjustment of the mA and/or kV according to patient size and/or use of iterative reconstruction technique.   COMPARISON:  CT 09/05/2022   FINDINGS: Lower chest: Lung bases demonstrate no acute airspace disease. Coronary vascular calcification.   Hepatobiliary: Gallstones. No focal hepatic abnormality. No biliary dilatation   Pancreas: Unremarkable. No pancreatic ductal dilatation or surrounding inflammatory changes.   Spleen: Normal in size without focal abnormality.   Adrenals/Urinary Tract: Adrenal glands are normal. Kidneys show no hydronephrosis. Small nonobstructing left kidney stone. 2.6 cm slightly dense right midpole renal lesion. Bladder partially obscured by artifact. Stable nonspecific perinephric fat stranding.   Stomach/Bowel: Stomach nonenlarged. No dilated small bowel. Negative appendix. Diverticular disease of the left colon. No acute bowel wall thickening   Vascular/Lymphatic: Advanced aortic atherosclerosis. No aneurysm. No suspicious lymph nodes.   Reproductive: Obscured by artifact   Other: Negative for pelvic effusion or free air. Small fat containing umbilical hernia   Musculoskeletal: Posterior spinal fusion hardware fell 2 through L4. Right-sided fixing screw at L4 projects lateral to the right vertebral cortex.   IMPRESSION: 1. No CT evidence for acute intra-abdominal or pelvic abnormality. 2. Diverticular disease of the left colon without acute inflammatory process. 3. Gallstones. 4. Nonobstructing left kidney stone. 5. 2.6 cm slightly dense right midpole renal lesion, indeterminate without contrast on this exam. When the patient is clinically stable and able to follow directions and hold their breath (preferably as an outpatient) further evaluation with  dedicated abdominal MRI should be considered. 6. Aortic atherosclerosis.   Aortic Atherosclerosis (ICD10-I70.0).   Electronically Signed   By: Jasmine Pang M.D.   On: 07/28/2023 22:12  Personally reviewed the above scan and agree with radiologic interpretation.    Assessment & Plan:    1. Renal mass - Differential diagnosis includes renal cyst versus renal cell carcinoma. - Ordered MRI of the abdomen with and without contrast to further characterize the renal lesion.  Will follow up with him once results are available to determine the need for further intervention or surveillance.  Return in about 4 weeks (around 11/01/2023) for MRI.  I have reviewed the above documentation for accuracy and completeness, and I agree with the above.   Vanna Scotland, MD   [facility] [address], [address] [place], [place] [address] [phone]

## 2023-10-06 DIAGNOSIS — E1122 Type 2 diabetes mellitus with diabetic chronic kidney disease: Secondary | ICD-10-CM | POA: Diagnosis not present

## 2023-10-06 DIAGNOSIS — Z96651 Presence of right artificial knee joint: Secondary | ICD-10-CM | POA: Diagnosis not present

## 2023-10-06 DIAGNOSIS — Z96643 Presence of artificial hip joint, bilateral: Secondary | ICD-10-CM | POA: Diagnosis not present

## 2023-10-06 DIAGNOSIS — H409 Unspecified glaucoma: Secondary | ICD-10-CM | POA: Diagnosis not present

## 2023-10-06 DIAGNOSIS — N183 Chronic kidney disease, stage 3 unspecified: Secondary | ICD-10-CM | POA: Diagnosis not present

## 2023-10-06 DIAGNOSIS — D75839 Thrombocytosis, unspecified: Secondary | ICD-10-CM | POA: Diagnosis not present

## 2023-10-06 DIAGNOSIS — G4733 Obstructive sleep apnea (adult) (pediatric): Secondary | ICD-10-CM | POA: Diagnosis not present

## 2023-10-06 DIAGNOSIS — E785 Hyperlipidemia, unspecified: Secondary | ICD-10-CM | POA: Diagnosis not present

## 2023-10-06 DIAGNOSIS — I491 Atrial premature depolarization: Secondary | ICD-10-CM | POA: Diagnosis not present

## 2023-10-06 DIAGNOSIS — I130 Hypertensive heart and chronic kidney disease with heart failure and stage 1 through stage 4 chronic kidney disease, or unspecified chronic kidney disease: Secondary | ICD-10-CM | POA: Diagnosis not present

## 2023-10-06 DIAGNOSIS — D631 Anemia in chronic kidney disease: Secondary | ICD-10-CM | POA: Diagnosis not present

## 2023-10-06 DIAGNOSIS — I480 Paroxysmal atrial fibrillation: Secondary | ICD-10-CM | POA: Diagnosis not present

## 2023-10-06 DIAGNOSIS — E876 Hypokalemia: Secondary | ICD-10-CM | POA: Diagnosis not present

## 2023-10-06 DIAGNOSIS — I4892 Unspecified atrial flutter: Secondary | ICD-10-CM | POA: Diagnosis not present

## 2023-10-06 DIAGNOSIS — M545 Low back pain, unspecified: Secondary | ICD-10-CM | POA: Diagnosis not present

## 2023-10-06 DIAGNOSIS — Z85828 Personal history of other malignant neoplasm of skin: Secondary | ICD-10-CM | POA: Diagnosis not present

## 2023-10-06 DIAGNOSIS — N179 Acute kidney failure, unspecified: Secondary | ICD-10-CM | POA: Diagnosis not present

## 2023-10-06 DIAGNOSIS — G8929 Other chronic pain: Secondary | ICD-10-CM | POA: Diagnosis not present

## 2023-10-06 DIAGNOSIS — Z8572 Personal history of non-Hodgkin lymphomas: Secondary | ICD-10-CM | POA: Diagnosis not present

## 2023-10-06 DIAGNOSIS — I5032 Chronic diastolic (congestive) heart failure: Secondary | ICD-10-CM | POA: Diagnosis not present

## 2023-10-06 DIAGNOSIS — K8020 Calculus of gallbladder without cholecystitis without obstruction: Secondary | ICD-10-CM | POA: Diagnosis not present

## 2023-10-06 DIAGNOSIS — Z7952 Long term (current) use of systemic steroids: Secondary | ICD-10-CM | POA: Diagnosis not present

## 2023-10-10 DIAGNOSIS — L84 Corns and callosities: Secondary | ICD-10-CM | POA: Diagnosis not present

## 2023-10-10 DIAGNOSIS — B351 Tinea unguium: Secondary | ICD-10-CM | POA: Diagnosis not present

## 2023-10-10 DIAGNOSIS — E1142 Type 2 diabetes mellitus with diabetic polyneuropathy: Secondary | ICD-10-CM | POA: Diagnosis not present

## 2023-10-10 DIAGNOSIS — M79675 Pain in left toe(s): Secondary | ICD-10-CM | POA: Diagnosis not present

## 2023-10-10 DIAGNOSIS — M79674 Pain in right toe(s): Secondary | ICD-10-CM | POA: Diagnosis not present

## 2023-10-12 ENCOUNTER — Other Ambulatory Visit: Payer: Self-pay | Admitting: Internal Medicine

## 2023-10-12 DIAGNOSIS — N183 Chronic kidney disease, stage 3 unspecified: Secondary | ICD-10-CM | POA: Diagnosis not present

## 2023-10-12 DIAGNOSIS — I4892 Unspecified atrial flutter: Secondary | ICD-10-CM | POA: Diagnosis not present

## 2023-10-12 DIAGNOSIS — K8020 Calculus of gallbladder without cholecystitis without obstruction: Secondary | ICD-10-CM | POA: Diagnosis not present

## 2023-10-12 DIAGNOSIS — Z96643 Presence of artificial hip joint, bilateral: Secondary | ICD-10-CM | POA: Diagnosis not present

## 2023-10-12 DIAGNOSIS — E785 Hyperlipidemia, unspecified: Secondary | ICD-10-CM | POA: Diagnosis not present

## 2023-10-12 DIAGNOSIS — E1122 Type 2 diabetes mellitus with diabetic chronic kidney disease: Secondary | ICD-10-CM | POA: Diagnosis not present

## 2023-10-12 DIAGNOSIS — I491 Atrial premature depolarization: Secondary | ICD-10-CM | POA: Diagnosis not present

## 2023-10-12 DIAGNOSIS — G4733 Obstructive sleep apnea (adult) (pediatric): Secondary | ICD-10-CM | POA: Diagnosis not present

## 2023-10-12 DIAGNOSIS — Z85828 Personal history of other malignant neoplasm of skin: Secondary | ICD-10-CM | POA: Diagnosis not present

## 2023-10-12 DIAGNOSIS — I5032 Chronic diastolic (congestive) heart failure: Secondary | ICD-10-CM | POA: Diagnosis not present

## 2023-10-12 DIAGNOSIS — Z96651 Presence of right artificial knee joint: Secondary | ICD-10-CM | POA: Diagnosis not present

## 2023-10-12 DIAGNOSIS — Z556 Problems related to health literacy: Secondary | ICD-10-CM | POA: Diagnosis not present

## 2023-10-12 DIAGNOSIS — H409 Unspecified glaucoma: Secondary | ICD-10-CM | POA: Diagnosis not present

## 2023-10-12 DIAGNOSIS — I130 Hypertensive heart and chronic kidney disease with heart failure and stage 1 through stage 4 chronic kidney disease, or unspecified chronic kidney disease: Secondary | ICD-10-CM | POA: Diagnosis not present

## 2023-10-12 DIAGNOSIS — Z7984 Long term (current) use of oral hypoglycemic drugs: Secondary | ICD-10-CM | POA: Diagnosis not present

## 2023-10-12 DIAGNOSIS — M545 Low back pain, unspecified: Secondary | ICD-10-CM | POA: Diagnosis not present

## 2023-10-12 DIAGNOSIS — I480 Paroxysmal atrial fibrillation: Secondary | ICD-10-CM | POA: Diagnosis not present

## 2023-10-12 DIAGNOSIS — Z8572 Personal history of non-Hodgkin lymphomas: Secondary | ICD-10-CM | POA: Diagnosis not present

## 2023-10-12 DIAGNOSIS — G8929 Other chronic pain: Secondary | ICD-10-CM | POA: Diagnosis not present

## 2023-10-12 DIAGNOSIS — D631 Anemia in chronic kidney disease: Secondary | ICD-10-CM | POA: Diagnosis not present

## 2023-10-12 NOTE — Telephone Encounter (Signed)
 Name of Medication: Hydrocodone Name of Pharmacy: [facility] Last Fill or Written Date and Quantity: 09-15-23 #60 Last Office Visit and Type: 08-16-23 Next Office Visit and Type: 10-31-23 Last Controlled Substance Agreement Date: 01-27-23 Last UDS: 01-27-23

## 2023-10-13 DIAGNOSIS — E119 Type 2 diabetes mellitus without complications: Secondary | ICD-10-CM | POA: Diagnosis not present

## 2023-10-13 DIAGNOSIS — H401131 Primary open-angle glaucoma, bilateral, mild stage: Secondary | ICD-10-CM | POA: Diagnosis not present

## 2023-10-13 DIAGNOSIS — H2512 Age-related nuclear cataract, left eye: Secondary | ICD-10-CM | POA: Diagnosis not present

## 2023-10-13 DIAGNOSIS — Z961 Presence of intraocular lens: Secondary | ICD-10-CM | POA: Diagnosis not present

## 2023-10-14 ENCOUNTER — Other Ambulatory Visit: Payer: Self-pay | Admitting: Internal Medicine

## 2023-10-19 DIAGNOSIS — Z7984 Long term (current) use of oral hypoglycemic drugs: Secondary | ICD-10-CM | POA: Diagnosis not present

## 2023-10-19 DIAGNOSIS — E1122 Type 2 diabetes mellitus with diabetic chronic kidney disease: Secondary | ICD-10-CM | POA: Diagnosis not present

## 2023-10-19 DIAGNOSIS — G4733 Obstructive sleep apnea (adult) (pediatric): Secondary | ICD-10-CM | POA: Diagnosis not present

## 2023-10-19 DIAGNOSIS — I5032 Chronic diastolic (congestive) heart failure: Secondary | ICD-10-CM | POA: Diagnosis not present

## 2023-10-19 DIAGNOSIS — I491 Atrial premature depolarization: Secondary | ICD-10-CM | POA: Diagnosis not present

## 2023-10-19 DIAGNOSIS — I130 Hypertensive heart and chronic kidney disease with heart failure and stage 1 through stage 4 chronic kidney disease, or unspecified chronic kidney disease: Secondary | ICD-10-CM | POA: Diagnosis not present

## 2023-10-19 DIAGNOSIS — E785 Hyperlipidemia, unspecified: Secondary | ICD-10-CM | POA: Diagnosis not present

## 2023-10-19 DIAGNOSIS — H409 Unspecified glaucoma: Secondary | ICD-10-CM | POA: Diagnosis not present

## 2023-10-19 DIAGNOSIS — Z85828 Personal history of other malignant neoplasm of skin: Secondary | ICD-10-CM | POA: Diagnosis not present

## 2023-10-19 DIAGNOSIS — N183 Chronic kidney disease, stage 3 unspecified: Secondary | ICD-10-CM | POA: Diagnosis not present

## 2023-10-19 DIAGNOSIS — M545 Low back pain, unspecified: Secondary | ICD-10-CM | POA: Diagnosis not present

## 2023-10-19 DIAGNOSIS — Z96651 Presence of right artificial knee joint: Secondary | ICD-10-CM | POA: Diagnosis not present

## 2023-10-19 DIAGNOSIS — G8929 Other chronic pain: Secondary | ICD-10-CM | POA: Diagnosis not present

## 2023-10-19 DIAGNOSIS — I480 Paroxysmal atrial fibrillation: Secondary | ICD-10-CM | POA: Diagnosis not present

## 2023-10-19 DIAGNOSIS — Z556 Problems related to health literacy: Secondary | ICD-10-CM | POA: Diagnosis not present

## 2023-10-19 DIAGNOSIS — K8020 Calculus of gallbladder without cholecystitis without obstruction: Secondary | ICD-10-CM | POA: Diagnosis not present

## 2023-10-19 DIAGNOSIS — I4892 Unspecified atrial flutter: Secondary | ICD-10-CM | POA: Diagnosis not present

## 2023-10-19 DIAGNOSIS — Z8572 Personal history of non-Hodgkin lymphomas: Secondary | ICD-10-CM | POA: Diagnosis not present

## 2023-10-19 DIAGNOSIS — D631 Anemia in chronic kidney disease: Secondary | ICD-10-CM | POA: Diagnosis not present

## 2023-10-19 DIAGNOSIS — Z96643 Presence of artificial hip joint, bilateral: Secondary | ICD-10-CM | POA: Diagnosis not present

## 2023-10-20 DIAGNOSIS — D631 Anemia in chronic kidney disease: Secondary | ICD-10-CM | POA: Diagnosis not present

## 2023-10-20 DIAGNOSIS — N184 Chronic kidney disease, stage 4 (severe): Secondary | ICD-10-CM | POA: Diagnosis not present

## 2023-10-20 DIAGNOSIS — N1832 Chronic kidney disease, stage 3b: Secondary | ICD-10-CM | POA: Diagnosis not present

## 2023-10-20 DIAGNOSIS — I10 Essential (primary) hypertension: Secondary | ICD-10-CM | POA: Diagnosis not present

## 2023-10-20 DIAGNOSIS — E1122 Type 2 diabetes mellitus with diabetic chronic kidney disease: Secondary | ICD-10-CM | POA: Diagnosis not present

## 2023-10-20 DIAGNOSIS — R809 Proteinuria, unspecified: Secondary | ICD-10-CM | POA: Diagnosis not present

## 2023-10-20 DIAGNOSIS — N2889 Other specified disorders of kidney and ureter: Secondary | ICD-10-CM | POA: Diagnosis not present

## 2023-10-20 DIAGNOSIS — I129 Hypertensive chronic kidney disease with stage 1 through stage 4 chronic kidney disease, or unspecified chronic kidney disease: Secondary | ICD-10-CM | POA: Diagnosis not present

## 2023-10-20 DIAGNOSIS — N2581 Secondary hyperparathyroidism of renal origin: Secondary | ICD-10-CM | POA: Diagnosis not present

## 2023-10-20 DIAGNOSIS — E785 Hyperlipidemia, unspecified: Secondary | ICD-10-CM | POA: Diagnosis not present

## 2023-10-25 ENCOUNTER — Telehealth: Payer: Self-pay

## 2023-10-25 ENCOUNTER — Ambulatory Visit: Payer: Self-pay | Admitting: Internal Medicine

## 2023-10-25 ENCOUNTER — Ambulatory Visit: Payer: Self-pay

## 2023-10-25 NOTE — Telephone Encounter (Signed)
  Chief Complaint: Cough, congestion Symptoms: Cough Frequency: ongoing 3 days Pertinent Negatives:  Disposition: [] ED /[] Urgent Care (no appt availability in office) / [] Appointment(In office/virtual)/ []  [facility]/ [] Home Care/ [] Refused Recommended Disposition /[] [facility]/ []  Follow-up with PCP Additional Notes:  This Clinical research associate spoke with care coordinator [name], she is requesting appointment late afternoon tomorrow for cold symptoms, they are unavailable in the morning due to a funeral. Case manager Pietro Cassis reports Kordel is on  3rd day of congestion, noted a slight wheeze, and cough. Wife reported he has a poor appetite. Blood pressure 180/90's for the past few days. 2 days ago 190/100. Overall not feeling well. No other information available. Plan for this writer to connect with family and Pietro Cassis will also follow up with them in 30 minutes. This writer attempted to call family to gather more information to determine disposition, call was unanswered and left message to call back. Case manager Davina aware this Clinical research associate was unable to reach family.     Message from Viola F sent at 10/25/2023  2:40 PM EDT  Summary: Cough/Congestion   Copied From CRM [id number]. Reason for Triage: Patient case manager [name] called says patient experiencing cough, congestion for the past two days, wanted an appointment for tomorrow but there's no availability - her call back number is [phone]

## 2023-10-25 NOTE — Telephone Encounter (Signed)
 Copied from CRM [id number]. Topic: Clinical - Home Health Verbal Orders >> Oct 25, 2023 11:58 AM Elizebeth Brooking wrote: Caller/Agency: Angie/ [facility]  Callback Number: [phone] Service Requested: Physical Therapy Frequency: 1 week 9 , as of February 27th  Any new concerns about the patient? No

## 2023-10-25 NOTE — Patient Outreach (Signed)
 Care Coordination   Follow Up Visit Note   10/25/2023 Name: Daniel Kline MRN: [id number] DOB: 06-04-1946  Daniel Kline is a 78 y.o. year old male who sees [name], MD for primary care. I spoke with  [name] and his wife [name] by phone today.  What matters to the patients health and wellness today?  Patient reports feeling bad the past 2 days. His wife reports he has not eaten or taken his medications for the past 3 days. States he has taken Mucinex. Reports home BP readings 180-190/80-100. Patient states he does not know why he has not taken his medications, he just does not feel good. Denies shortness of breath, fever. Reports coughing a little bit today, cough is worse at nighttime. Wheezing noted while speaking with patient.  Telephone call placed to primary care office. Spoke with Sim Boast and stated patient's reported symptoms. Requested an acute sick visit for patient on tomorrow 10/26/23. Sim Boast stated she will have triage nurse return call to RN case manager. Return call received from triage nurse Joni Reining. Patient's symptoms reported. Joni Reining states she will need to contact patient to assess. Spoke with patient's wife [name] to inform her that triage nurse will contact and to schedule RN case manager follow-up. Upon chart review, patient has been scheduled for acute sick visit tomorrow morning 10/26/23.   Goals Addressed             This Visit's Progress    Management of health conditions       Interventions Today    Flowsheet Row Most Recent Value  Chronic Disease   Chronic disease during today's visit Other  [Reported congestion, cough-like symptoms for 2 days.]  General Interventions   General Interventions Discussed/Reviewed General Interventions Reviewed, Communication with  [name] for congestion/cough symptoms, duration, and home treatment. Assessed for shortness of breath. Assessed for home COVID testing.]  Communication with  PCP/Specialists  [Provider office contacted to arrange acute sick visit.]  Education Interventions   Education Provided Provided Education  Provided Verbal Education On Other  [Educated on risks of uncontrolled high blood pressure.]  Pharmacy Interventions   Pharmacy Dicussed/Reviewed Pharmacy Topics Reviewed, Medication Adherence  Medication Adherence Not taking medication  [Advised patient to take blood pressure medication as scheduled this evening and as prescribed by provider.]              SDOH assessments and interventions completed:  No     Care Coordination Interventions:  Yes, provided   Follow up plan: Follow up call scheduled for 11/07/23 at 2 PM    Encounter Outcome:  Patient Visit Completed   [name] RN, BSN, CCM [place]  [facility], Population Health Case Manager Phone: [phone]

## 2023-10-25 NOTE — Telephone Encounter (Signed)
Verbal orders left on verified VM  

## 2023-10-25 NOTE — Patient Instructions (Signed)
 Visit Information  Thank you for taking time to visit with me today. Please don't hesitate to contact me if I can be of assistance to you.   Following are the goals we discussed today:   Goals Addressed             This Visit's Progress    Management of health conditions       Interventions Today    Flowsheet Row Most Recent Value  Chronic Disease   Chronic disease during today's visit Other  [Reported congestion, cough-like symptoms for 2 days.]  General Interventions   General Interventions Discussed/Reviewed General Interventions Reviewed, Communication with  [name] for congestion/cough symptoms, duration, and home treatment. Assessed for shortness of breath. Assessed for home COVID testing.]  Communication with PCP/Specialists  [Provider office contacted to arrange acute sick visit.]  Education Interventions   Education Provided Provided Education  Provided Verbal Education On Other  [Educated on risks of uncontrolled high blood pressure.]  Pharmacy Interventions   Pharmacy Dicussed/Reviewed Pharmacy Topics Reviewed, Medication Adherence  Medication Adherence Not taking medication  [Advised patient to take blood pressure medication as scheduled this evening and as prescribed by provider.]              Our next appointment is by telephone on 11/07/23 at 2 PM  Please call the care guide team at [phone] if you need to cancel or reschedule your appointment.   If you are experiencing a Mental Health or Behavioral Health Crisis or need someone to talk to, please call the Suicide and Crisis Lifeline: 988 call 1-800-273-TALK (toll free, 24 hour hotline)  Patient verbalizes understanding of instructions and care plan provided today and agrees to view in MyChart. Active MyChart status and patient understanding of how to access instructions and care plan via MyChart confirmed with patient.     [name] RN, BSN, CCM [employer], Population  Health Case Manager Phone: [phone]

## 2023-10-25 NOTE — Telephone Encounter (Signed)
 Apt made for this patient per request.

## 2023-10-26 ENCOUNTER — Other Ambulatory Visit: Payer: Self-pay

## 2023-10-26 ENCOUNTER — Encounter: Payer: Self-pay | Admitting: Family Medicine

## 2023-10-26 ENCOUNTER — Ambulatory Visit (INDEPENDENT_AMBULATORY_CARE_PROVIDER_SITE_OTHER): Admitting: Family Medicine

## 2023-10-26 ENCOUNTER — Emergency Department

## 2023-10-26 ENCOUNTER — Emergency Department
Admission: EM | Admit: 2023-10-26 | Discharge: 2023-10-26 | Disposition: A | Discharge status: Home / Self Care | Attending: Emergency Medicine | Admitting: Emergency Medicine

## 2023-10-26 VITALS — BP 138/86 | HR 88 | Temp 98.2°F | Ht 66.0 in | Wt 229.5 lb

## 2023-10-26 DIAGNOSIS — U071 COVID-19: Secondary | ICD-10-CM

## 2023-10-26 DIAGNOSIS — E1122 Type 2 diabetes mellitus with diabetic chronic kidney disease: Secondary | ICD-10-CM | POA: Diagnosis not present

## 2023-10-26 DIAGNOSIS — N189 Chronic kidney disease, unspecified: Secondary | ICD-10-CM | POA: Insufficient documentation

## 2023-10-26 DIAGNOSIS — R011 Cardiac murmur, unspecified: Secondary | ICD-10-CM | POA: Insufficient documentation

## 2023-10-26 DIAGNOSIS — N1832 Chronic kidney disease, stage 3b: Secondary | ICD-10-CM

## 2023-10-26 DIAGNOSIS — Z7984 Long term (current) use of oral hypoglycemic drugs: Secondary | ICD-10-CM | POA: Diagnosis not present

## 2023-10-26 DIAGNOSIS — I480 Paroxysmal atrial fibrillation: Secondary | ICD-10-CM | POA: Diagnosis not present

## 2023-10-26 DIAGNOSIS — D849 Immunodeficiency, unspecified: Secondary | ICD-10-CM

## 2023-10-26 DIAGNOSIS — I129 Hypertensive chronic kidney disease with stage 1 through stage 4 chronic kidney disease, or unspecified chronic kidney disease: Secondary | ICD-10-CM | POA: Diagnosis not present

## 2023-10-26 DIAGNOSIS — N184 Chronic kidney disease, stage 4 (severe): Secondary | ICD-10-CM | POA: Diagnosis not present

## 2023-10-26 DIAGNOSIS — R0602 Shortness of breath: Secondary | ICD-10-CM

## 2023-10-26 LAB — URINALYSIS, ROUTINE W REFLEX MICROSCOPIC
Bacteria, UA: NONE SEEN
Bilirubin Urine: NEGATIVE
Glucose, UA: >=500 mg/dL — AB
Ketones, ur: NEGATIVE mg/dL
Leukocytes,Ua: NEGATIVE
Nitrite: NEGATIVE
Protein, ur: 100 mg/dL — AB
Specific Gravity, Urine: 1.013 (ref 1.005–1.030)
Squamous Epithelial / HPF: 0 /HPF (ref 0–5)
pH: 6 (ref 5.0–8.0)

## 2023-10-26 LAB — CBC WITH DIFFERENTIAL/PLATELET
Abs Immature Granulocytes: 0.01 10*3/uL (ref 0.00–0.07)
Basophils Absolute: 0.1 10*3/uL (ref 0.0–0.1)
Basophils Relative: 1 %
Eosinophils Absolute: 0 10*3/uL (ref 0.0–0.5)
Eosinophils Relative: 1 %
HCT: 31.6 % — ABNORMAL LOW (ref 39.0–52.0)
Hemoglobin: 11 g/dL — ABNORMAL LOW (ref 13.0–17.0)
Immature Granulocytes: 0 %
Lymphocytes Relative: 17 %
Lymphs Abs: 0.6 10*3/uL — ABNORMAL LOW (ref 0.7–4.0)
MCH: 32.9 pg (ref 26.0–34.0)
MCHC: 34.8 g/dL (ref 30.0–36.0)
MCV: 94.6 fL (ref 80.0–100.0)
Monocytes Absolute: 0.2 10*3/uL (ref 0.1–1.0)
Monocytes Relative: 4 %
Neutro Abs: 2.6 10*3/uL (ref 1.7–7.7)
Neutrophils Relative %: 77 %
Platelets: 106 10*3/uL — ABNORMAL LOW (ref 150–400)
RBC: 3.34 MIL/uL — ABNORMAL LOW (ref 4.22–5.81)
RDW: 15 % (ref 11.5–15.5)
WBC: 3.5 10*3/uL — ABNORMAL LOW (ref 4.0–10.5)
nRBC: 0 % (ref 0.0–0.2)

## 2023-10-26 LAB — COMPREHENSIVE METABOLIC PANEL
ALT: 28 U/L (ref 0–44)
AST: 22 U/L (ref 15–41)
Albumin: 3.5 g/dL (ref 3.5–5.0)
Alkaline Phosphatase: 47 U/L (ref 38–126)
Anion gap: 6 (ref 5–15)
BUN: 53 mg/dL — ABNORMAL HIGH (ref 8–23)
CO2: 22 mmol/L (ref 22–32)
Calcium: 8.7 mg/dL — ABNORMAL LOW (ref 8.9–10.3)
Chloride: 107 mmol/L (ref 98–111)
Creatinine, Ser: 3.05 mg/dL — ABNORMAL HIGH (ref 0.61–1.24)
GFR, Estimated: 20 mL/min — ABNORMAL LOW (ref >60)
Glucose, Bld: 151 mg/dL — ABNORMAL HIGH (ref 70–99)
Potassium: 4.2 mmol/L (ref 3.5–5.1)
Sodium: 135 mmol/L (ref 135–145)
Total Bilirubin: 1.2 mg/dL (ref 0.0–1.2)
Total Protein: 6.4 g/dL — ABNORMAL LOW (ref 6.5–8.1)

## 2023-10-26 LAB — POCT INFLUENZA A/B
Influenza A, POC: NEGATIVE
Influenza B, POC: NEGATIVE

## 2023-10-26 LAB — POC COVID19 BINAXNOW: SARS Coronavirus 2 Ag: POSITIVE — AB

## 2023-10-26 MED ORDER — SODIUM CHLORIDE 0.9 % IV BOLUS
500.0000 mL | Freq: Once | INTRAVENOUS | Status: AC
  Administered 2023-10-26: 500 mL via INTRAVENOUS

## 2023-10-26 NOTE — ED Provider Notes (Signed)
 I saw and evaluated the patient.  He is currently awake alert oriented to situation having COVID the year reports he is not sure exactly what month.  He initially refused medical care reporting that he does not wish to be in the hospital never intended to come to the ER and did not expect his primary care doctor to send him here either.  After discussion with the patient as well as his wife via phone, patient understanding of recommendations to obtain medical care and evaluation here today.  Patient agreeable to proceed with x-ray labs and further testing as indicated.  Vital signs are stable no hypoxia no acute distress.  Does have a history of complicated COVID with a prior admission about 2 or 3 years ago according to the patient   Sharyn Creamer, MD 10/26/23 1203

## 2023-10-26 NOTE — ED Provider Notes (Signed)
 [facility] Provider Note    Event Date/Time   First MD Initiated Contact with Patient 10/26/23 1055     (approximate)   History   COVID   HPI  Daniel Kline is a 78 y.o. male   presents to the ED with being diagnosed with COVID this morning at his PCPs office.  Patient states that this is his fourth day of being sick.  He denies any difficulty breathing, shortness of breath and does not want to be in the hospital.  Patient states that his wife brought him as advised by his PCP and then left him.  Patient has history of hypertension, CKD, diabetes type 2, GERD, rheumatoid arthritis, mood disorder, chronic venous insufficiency, PA-C, renal mass.      Physical Exam   Triage Vital Signs: ED Triage Vitals [10/26/23 1050]  Encounter Vitals Group     BP (!) 175/98     Systolic BP Percentile      Diastolic BP Percentile      Pulse Rate 72     Resp 19     Temp 98.9 F (37.2 C)     Temp Source Oral     SpO2 96 %     Weight 229 lb (103.9 kg)     Height 5\' 6"  (1.676 m)     Head Circumference      Peak Flow      Pain Score 0     Pain Loc      Pain Education      Exclude from Growth Chart     Most recent vital signs: Vitals:   10/26/23 1050  BP: (!) 175/98  Pulse: 72  Resp: 19  Temp: 98.9 F (37.2 C)  SpO2: 96%     General: Awake, no distress.  Patient very talkative and able to talk in complete sentences without any noticeable difficulty breathing.  Patient is oriented to time and place. CV:  Good peripheral perfusion.  Heart regular rate rhythm. Resp:  Normal effort.  Lungs are clear bilaterally.  No rales or rhonchi noted. Abd:  No distention.  Other:     ED Results / Procedures / Treatments   Labs (all labs ordered are listed, but only abnormal results are displayed) Labs Reviewed  CBC WITH DIFFERENTIAL/PLATELET - Abnormal; Notable for the following components:      Result Value   WBC 3.5 (*)    RBC 3.34 (*)    Hemoglobin  11.0 (*)    HCT 31.6 (*)    Platelets 106 (*)    Lymphs Abs 0.6 (*)    All other components within normal limits  COMPREHENSIVE METABOLIC PANEL - Abnormal; Notable for the following components:   Glucose, Bld 151 (*)    BUN 53 (*)    Creatinine, Ser 3.05 (*)    Calcium 8.7 (*)    Total Protein 6.4 (*)    GFR, Estimated 20 (*)    All other components within normal limits  URINALYSIS, ROUTINE W REFLEX MICROSCOPIC - Abnormal; Notable for the following components:   Color, Urine STRAW (*)    APPearance CLEAR (*)    Glucose, UA >=500 (*)    Hgb urine dipstick SMALL (*)    Protein, ur 100 (*)    All other components within normal limits      RADIOLOGY Chest x-ray images reviewed and interpreted by myself independent of the radiologist and no infiltrate was noted.  Radiology official report no acute  process noted.    PROCEDURES:  Critical Care performed:   Procedures   MEDICATIONS ORDERED IN ED: Medications  sodium chloride 0.9 % bolus 500 mL (0 mLs Intravenous Stopped 10/26/23 1349)     IMPRESSION / MDM / ASSESSMENT AND PLAN / ED COURSE  I reviewed the triage vital signs and the nursing notes.   Differential diagnosis includes, but is not limited to, COVID, pneumonia, dehydration, hyperglycemia, hypocalcemia, hypokalemia.  78 year old male presents to the ED after being diagnosed with COVID in his doctor's office this morning.  Patient initially stated that he did not want to be in the emergency department and requested to be discharged.  Dr. Fanny Bien was in to talk with patient also and he agreed to have some test run.  Chest x-ray was stable per radiology.  CBC was consistent with a viral process.  CMP showed glucose 151 nonfasting, BUN 53 and creatinine 3.05 and a GFR of less than 20.  These test were compared with previous test in the past at [facility] and also with his PCPs office which were consistent.  I spoke with patient and wife.  I explained that Paxlovid is not  recommended for someone with kidney functions and that he currently is doing well on his third/fourth day of symptoms.  Wife is coming to get the patient and he was made aware.  They are to follow-up with his PCP if any continued problems and return to the emergency department if any severe worsening of his symptoms.      Patient's presentation is most consistent with acute illness / injury with system symptoms.  FINAL CLINICAL IMPRESSION(S) / ED DIAGNOSES   Final diagnoses:  COVID     Rx / DC Orders   ED Discharge Orders     None        Note:  This document was prepared using Dragon voice recognition software and may include unintentional dictation errors.   Tommi Rumps, PA-C 10/26/23 1524    Sharyn Creamer, MD 10/30/23 [phone]

## 2023-10-26 NOTE — Assessment & Plan Note (Signed)
 Murmur heard today - no h/o this. Latest Echocardiogram reviewed - no significant valvular disease

## 2023-10-26 NOTE — Assessment & Plan Note (Addendum)
 COVID positive. Decreased PO intake, has not taking any of his medications in the past 2 days due to malaise, anorexia, fatigue.  He is at high risk for complications. He is somewhat altered today (wife agrees, has to help him answer questions) despite normal vital signs, normal O2 sats.  Recommend:  ER evaluation today for IV Remdesivir consideration and further evaluation of AMS.   Paxlovid drug interactions:  Amlodipine, clorazepate, hydrocodone, rosuvastatin Avoiding Paxlovid due to above drug interactions as well as CKD stage 4 (GFR 20s).

## 2023-10-26 NOTE — Progress Notes (Signed)
 Ph: [phone] Fax: [phone]   Patient ID: Daniel Kline, male    DOB: 04/05/1946, 78 y.o.   MRN: [id number]  This visit was conducted in person.  BP 138/86   Pulse 88   Temp 98.2 F (36.8 C) (Oral)   Ht 5\' 6"  (1.676 m)   Wt 229 lb 8 oz (104.1 kg)   SpO2 96%   BMI 37.04 kg/m    CC: congestion, rhinorrhea, dyspnea Subjective:   HPI: Daniel Kline is a 78 y.o. male presenting on 10/26/2023 for Nasal Congestion (C/o nasal congestion and SOB. Denies any other sxs. Started 10/24/23. Pt accompanied by wife, "[name]".)   First day of symptoms: 10/24/2023 Tested COVID positive: today  Current symptoms: no appetite "haven't eaten in 3 days," not drinking well, diarrhea x4 yesterday, nonproductive cough, head and chest congestion, rhinorrhea,  chills, body aches, nausea, dyspnea. Some confusion - wife helps answer questions.  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      Relevant past medical, surgical, family and social history reviewed and updated as indicated. Interim medical history since our last visit reviewed. Allergies and medications reviewed and updated. Outpatient Medications Prior to Visit  Medication Sig Dispense Refill   amLODipine (NORVASC) 5 MG tablet Take 1 tablet (5 mg total) by mouth daily. 90 tablet 3   calcitRIOL (ROCALTROL) 0.25 MCG capsule Take 0.25 mcg by mouth daily.     calcium carbonate (OSCAL) 1500 (600 Ca) MG TABS tablet Take 600 mg of elemental  calcium by mouth daily with breakfast.     carvedilol (COREG) 3.125 MG tablet TAKE 1 TABLET BY MOUTH 2 TIMES DAILY WITH A MEAL. 180 tablet 3   Cholecalciferol (VITAMIN D-3) 25 MCG (1000 UT) CAPS Take 1,000 Units by mouth daily.     clorazepate (TRANXENE) 7.5 MG tablet TAKE 1 TABLET BY MOUTH TWICE A DAY AS NEEDED FOR ANXIETY 60 tablet 0   cyanocobalamin (VITAMIN B12) 500 MCG tablet Take 1,000 mcg by mouth daily.     EPINEPHrine 0.3 mg/0.3 mL IJ SOAJ injection Inject 0.3 mg into the muscle as needed for anaphylaxis. 1 each 5   Ferrous Sulfate (IRON PO) Take 1 tablet by mouth at bedtime.     folic acid (FOLVITE) 1 MG tablet Take 1 mg by mouth daily. Every day except day that he takes methotrexate     furosemide (LASIX) 40 MG tablet Take 1 tablet (40 mg total) by mouth daily. 30 tablet 0   gabapentin (NEURONTIN) 300 MG capsule TAKE 1 CAPSULE BY MOUTH 3 TIMES  DAILY 300 capsule 3   glucose blood (ONETOUCH VERIO) test strip Use to check blood sugar once daily 100 each 12   HYDROcodone-acetaminophen (NORCO/VICODIN) 5-325 MG tablet TAKE 1 TABLET BY MOUTH EVERY 6 HOURS AS NEEDED FOR PAIN 60 tablet 0   hydrocortisone 2.5 % cream Apply topically 3 (three) times daily as needed. 56 g 3   hydrOXYzine (ATARAX) 25 MG tablet TAKE ONE TABLET EVERY EIGHT HOURS AS NEEDED 90 tablet  3   isosorbide mononitrate (IMDUR) 60 MG 24 hr tablet Take 1 tablet (60 mg total) by mouth daily before lunch. 90 tablet 3   latanoprost (XALATAN) 0.005 % ophthalmic solution Place 1 drop into both eyes at bedtime.     losartan (COZAAR) 100 MG tablet TAKE 1 TABLET BY MOUTH DAILY 90 tablet 3   MAGNESIUM PO Take 1 tablet by mouth daily.     methotrexate (RHEUMATREX) 2.5 MG tablet Take 5 mg by mouth once a week.     rosuvastatin (CRESTOR) 10 MG tablet Take 1 tablet (10 mg total) by mouth daily. 90 tablet 3   spironolactone (ALDACTONE) 25 MG tablet Take 25 mg by mouth daily.     No facility-administered medications prior to visit.      Per HPI unless specifically indicated in ROS section below Review of Systems  Objective:  BP 138/86   Pulse 88   Temp 98.2 F (36.8 C) (Oral)   Ht 5\' 6"  (1.676 m)   Wt 229 lb 8 oz (104.1 kg)   SpO2 96%   BMI 37.04 kg/m   Wt Readings from Last 3 Encounters:  10/26/23 229 lb 8 oz (104.1 kg)  10/04/23 242 lb (109.8 kg)  09/23/23 240 lb (108.9 kg)      Physical Exam Vitals and nursing note reviewed.  Constitutional:      Appearance: Normal appearance. He is not ill-appearing.  HENT:     Head: Normocephalic and atraumatic.     Right Ear: Hearing, tympanic membrane, ear canal and external ear normal. There is no impacted cerumen.     Left Ear: Hearing, tympanic membrane, ear canal and external ear normal. There is no impacted cerumen.     Nose:     Right Sinus: No maxillary sinus tenderness or frontal sinus tenderness.     Left Sinus: No maxillary sinus tenderness or frontal sinus tenderness.     Mouth/Throat:     Comments: Wearing mask Eyes:     Extraocular Movements: Extraocular movements intact.     Conjunctiva/sclera: Conjunctivae normal.     Pupils: Pupils are equal, round, and reactive to light.  Cardiovascular:     Rate and Rhythm: Normal rate. Rhythm irregularly irregular.     Pulses: Normal pulses.     Heart sounds: Murmur (3/6 systolic) heard.  Pulmonary:     Effort: Pulmonary effort is normal. No respiratory distress.     Breath sounds: Normal breath sounds. No wheezing, rhonchi or rales.  Musculoskeletal:     Cervical back: Normal range of motion and neck supple. No rigidity.     Right lower leg: No edema.     Left lower leg: No edema.  Lymphadenopathy:     Cervical: No cervical adenopathy.  Skin:    General: Skin is warm and dry.     Findings: No rash.  Neurological:     Mental Status: He is alert.  Psychiatric:        Mood and Affect: Mood normal.        Behavior: Behavior normal.       Results for orders placed or performed in visit on 10/26/23   POC COVID-19 BinaxNow   Collection Time: 10/26/23  9:01 AM  Result Value Ref Range   SARS Coronavirus 2 Ag Positive (A) Negative  POCT Influenza A/B   Collection Time: 10/26/23  9:09 AM  Result Value Ref Range   Influenza A, POC Negative Negative   Influenza B, POC Negative Negative  Lab Results  Component Value Date   NA 139 09/23/2023   CL 105 09/23/2023   K 4.8 09/23/2023   CO2 23 09/23/2023   BUN 32 (H) 09/23/2023   CREATININE 2.60 (H) 09/23/2023   GFRNONAA 24 (L) 09/23/2023   CALCIUM 9.2 09/23/2023   PHOS 3.7 08/16/2023   ALBUMIN 3.4 (L) 08/16/2023   GLUCOSE 70 09/23/2023     Assessment & Plan:   Problem List Items Addressed This Visit     Stage 3b chronic kidney disease (HCC)   COVID-19 virus infection - Primary   COVID positive. Decreased PO intake, has not taking any of his medications in the past 2 days due to malaise, anorexia, fatigue.  He is at high risk for complications. He is somewhat altered today (wife agrees, has to help him answer questions) despite normal vital signs, normal O2 sats.  Recommend:  ER evaluation today for IV Remdesivir consideration and further evaluation of AMS.   Paxlovid drug interactions:  Amlodipine, clorazepate, hydrocodone, rosuvastatin Avoiding Paxlovid due to above drug interactions as well as CKD stage 4 (GFR 20s).       Paroxysmal atrial fibrillation (HCC)   H/o ablation 2019 without recurrent afib since then.  Sounds irregular today.      Type 2 diabetes mellitus with chronic kidney disease, without long-term current use of insulin (HCC)   Systolic murmur   Murmur heard today - no h/o this. Latest Echocardiogram reviewed - no significant valvular disease      Other Visit Diagnoses       Shortness of breath       Relevant Orders   POC COVID-19 BinaxNow (Completed)   POCT Influenza A/B (Completed)     Immunocompromised state (HCC)            No orders of the defined types were placed in this  encounter.   Orders Placed This Encounter  Procedures   POC COVID-19 BinaxNow    Previously tested for COVID-19:   Yes    Resident in a congregate (group) care setting:   No    Employed in healthcare setting:   No   POCT Influenza A/B    Patient Instructions  Concern for confusion, dehydration due to COVID infection. You are at risk of complications from COVID.  Recommend going to ER today for consideration of Remdesivir antiviral treatment.   Follow up plan: No follow-ups on file.  Eustaquio Boyden, MD

## 2023-10-26 NOTE — Discharge Instructions (Signed)
 Follow-up with your primary care provider if any continued problems or concerns.  Increase fluids to stay hydrated.  Tylenol if needed for fever or bodyaches.  Read the information about COVID.  You are contagious at this time.  Return to the emergency department if any severe worsening of your symptoms.

## 2023-10-26 NOTE — ED Triage Notes (Signed)
 Pt here and states sent by PCP due to being DX' ed with COVID. Pt denies any complaints at this time. Pt states his wife told him go to PCP. NAD noted.

## 2023-10-26 NOTE — Patient Instructions (Addendum)
 Concern for confusion, dehydration due to COVID infection. You are at risk of complications from COVID.  Recommend going to ER today for consideration of Remdesivir antiviral treatment.

## 2023-10-26 NOTE — Assessment & Plan Note (Signed)
 H/o ablation 2019 without recurrent afib since then.  Sounds irregular today.

## 2023-10-26 NOTE — ED Notes (Signed)
 Patient transported to X-ray

## 2023-10-31 ENCOUNTER — Ambulatory Visit: Payer: Medicare Other | Admitting: Internal Medicine

## 2023-10-31 ENCOUNTER — Encounter: Payer: Self-pay | Admitting: Internal Medicine

## 2023-10-31 VITALS — BP 122/68 | HR 55 | Temp 97.8°F | Ht 66.0 in | Wt 239.0 lb

## 2023-10-31 DIAGNOSIS — Z7984 Long term (current) use of oral hypoglycemic drugs: Secondary | ICD-10-CM

## 2023-10-31 DIAGNOSIS — N1832 Chronic kidney disease, stage 3b: Secondary | ICD-10-CM

## 2023-10-31 DIAGNOSIS — Z0000 Encounter for general adult medical examination without abnormal findings: Secondary | ICD-10-CM

## 2023-10-31 DIAGNOSIS — N184 Chronic kidney disease, stage 4 (severe): Secondary | ICD-10-CM | POA: Diagnosis not present

## 2023-10-31 DIAGNOSIS — M0600 Rheumatoid arthritis without rheumatoid factor, unspecified site: Secondary | ICD-10-CM

## 2023-10-31 DIAGNOSIS — E1121 Type 2 diabetes mellitus with diabetic nephropathy: Secondary | ICD-10-CM

## 2023-10-31 DIAGNOSIS — C84A0 Cutaneous T-cell lymphoma, unspecified, unspecified site: Secondary | ICD-10-CM

## 2023-10-31 DIAGNOSIS — E1122 Type 2 diabetes mellitus with diabetic chronic kidney disease: Secondary | ICD-10-CM

## 2023-10-31 DIAGNOSIS — I480 Paroxysmal atrial fibrillation: Secondary | ICD-10-CM

## 2023-10-31 DIAGNOSIS — F1120 Opioid dependence, uncomplicated: Secondary | ICD-10-CM

## 2023-10-31 DIAGNOSIS — L97901 Non-pressure chronic ulcer of unspecified part of unspecified lower leg limited to breakdown of skin: Secondary | ICD-10-CM | POA: Diagnosis not present

## 2023-10-31 LAB — POCT GLYCOSYLATED HEMOGLOBIN (HGB A1C): Hemoglobin A1C: 6.2 % — AB (ref 4.0–5.6)

## 2023-10-31 LAB — HM DIABETES FOOT EXAM

## 2023-10-31 NOTE — Assessment & Plan Note (Signed)
 Has gone under 30 but may be acute issues Does keep up with nephrology On farxiga and losartan

## 2023-10-31 NOTE — Assessment & Plan Note (Signed)
 Or flutter No problems since ablation

## 2023-10-31 NOTE — Progress Notes (Signed)
 Hearing Screening - Comments:: Wears hearing aids Vision Screening - Comments:: Saw eye doctor 01/2023

## 2023-10-31 NOTE — Assessment & Plan Note (Signed)
 On the methotrexate 5mg  weekly with success Sees rheumatology

## 2023-10-31 NOTE — Assessment & Plan Note (Signed)
 Lab Results  Component Value Date   HGBA1C 6.2 (A) 10/31/2023   Control much better again On farxiga 5mg  daily

## 2023-10-31 NOTE — Assessment & Plan Note (Signed)
 Gets the light therapy at [facility]

## 2023-10-31 NOTE — Progress Notes (Signed)
 Subjective:    Patient ID: Daniel Kline, male    DOB: [birth year], 78 y.o.   MRN: [id number]  HPI Here for Medicare wellness visit and follow up of chronic health conditions Reviewed advanced directives Reviewed other doctors---Dr Sunday Spillers, Dr Charyl Dancer, Dr Loraine Leriche, Dr Josie Saunders, Dr Sylvie Farrier, Dr Beckey Rutter, Dr Patel--rheumatology, Dr Olsen--oncology, Dr Gollan--cardiology, Dr Arita Miss Was hospitalized once in December with diarrhea and AKI. No surgery in the past year Vision is okay Has hearing aides No alcohol or tobacco Does exercise some in house ---light weights, etc Larey Seat once last summer--no injury No depression or anhedonia Does help with shopping and housework Memory seems to be okay  Some variation in weight Back up some in the past few months BMI 38+  GFR mostly in the 20's over the past year--including last week when in ER diagnosed with COVID  Sugars usually better recently Under 150 for the most part  Has sore on left leg---not healed up yet Doesn't seem infected  No chest pain or SOB No dizziness or syncope No edema No palpitations  Is on methotrexate for RA--with folic acid Arthritis seems okay Ongoing pain---left shoulder worst and in legs. 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 90 tablet 3   calcitRIOL (ROCALTROL) 0.25 MCG capsule Take 0.25 mcg by mouth daily.     calcium carbonate (OSCAL) 1500 (600 Ca) MG TABS tablet Take 600 mg of elemental calcium by mouth daily with breakfast.     carvedilol (COREG) 3.125 MG tablet TAKE 1 TABLET BY MOUTH 2 TIMES DAILY WITH A MEAL. 180 tablet 3   Cholecalciferol (VITAMIN D-3) 25 MCG (1000 UT) CAPS Take 1,000 Units by mouth daily.     clorazepate  (TRANXENE) 7.5 MG tablet TAKE 1 TABLET BY MOUTH TWICE A DAY AS NEEDED FOR ANXIETY 60 tablet 0   cyanocobalamin (VITAMIN B12) 500 MCG tablet Take 1,000 mcg by mouth daily.     dapagliflozin propanediol (FARXIGA) 5 MG TABS tablet Take 5 mg by mouth daily.     EPINEPHrine 0.3 mg/0.3 mL IJ SOAJ injection Inject 0.3 mg into the muscle as needed for anaphylaxis. 1 each 5   Ferrous Sulfate (IRON PO) Take 1 tablet by mouth at bedtime.     folic acid (FOLVITE) 1 MG tablet Take 1 mg by mouth daily. Every day except day that he takes methotrexate     furosemide (LASIX) 40 MG tablet Take 1 tablet (40 mg total) by mouth daily. 30 tablet 0   gabapentin (NEURONTIN) 300 MG capsule TAKE 1 CAPSULE BY MOUTH 3 TIMES  DAILY 300 capsule 3   glucose blood (ONETOUCH VERIO) test strip Use to check blood sugar once daily 100 each 12   HYDROcodone-acetaminophen (NORCO/VICODIN) 5-325 MG tablet TAKE 1 TABLET BY MOUTH EVERY 6 HOURS AS NEEDED FOR PAIN 60 tablet 0   hydrocortisone 2.5 % cream Apply topically 3 (three) times daily as needed. 56 g 3   hydrOXYzine (ATARAX) 25 MG tablet TAKE ONE TABLET EVERY EIGHT HOURS AS NEEDED 90 tablet 3   isosorbide mononitrate (IMDUR) 60 MG 24 hr tablet Take 1 tablet (60 mg total) by mouth daily before lunch. 90 tablet 3   latanoprost (XALATAN) 0.005 % ophthalmic solution Place 1 drop into both eyes at  bedtime.     losartan (COZAAR) 100 MG tablet TAKE 1 TABLET BY MOUTH DAILY 90 tablet 3   MAGNESIUM PO Take 1 tablet by mouth daily.     methotrexate (RHEUMATREX) 2.5 MG tablet Take 5 mg by mouth once a week.     rosuvastatin (CRESTOR) 10 MG tablet Take 1 tablet (10 mg total) by mouth daily. 90 tablet 3   spironolactone (ALDACTONE) 25 MG tablet Take 25 mg by mouth daily.     No current facility-administered medications on file prior to visit.    Allergies  Allergen Reactions   Bee Venom Anaphylaxis   Oxycodone Other (See Comments)    Delusions   Hydromorphone Other (See Comments)     hallucinating   Hydroxychloroquine     Other reaction(s): Other (See Comments) He broke out really badly.   Zolpidem Other (See Comments)    Past Medical History:  Diagnosis Date   Anemia    H/O   Anxiety    Arthritis    Atrial flutter (HCC)    a. s/p post ablation in 04/2017   Chronic kidney disease    Complication of anesthesia    CTCL (cutaneous T-cell lymphoma) (HCC)    Diabetes mellitus without complication (HCC)    Diastolic dysfunction    a. 03/2022 Echo: EF 60-65%, no rwma, GrI DD, nl RV fxn.   Dysplastic nevus 12/19/2017   Right distal lat. forearm near wrist. Severe atypia, close to peripheral margin.   Dysplastic nevus 06/21/2018   Upper back right paraspinal. Severe atypia, peripheral margin involved. Excised 07/11/2018, margins free.   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 forearm. SCCis, hypertrophic.    Past Surgical History:  Procedure Laterality Date   BACK SURGERY     LUMBAR   CARDIAC ELECTROPHYSIOLOGY STUDY AND ABLATION  2019   CATARACT EXTRACTION W/PHACO Right 05/05/2022   Procedure: CATARACT EXTRACTION PHACO AND INTRAOCULAR LENS PLACEMENT (IOC) RIGHT;  Surgeon: Lockie Mola, MD;  Location: [facility];  Service: Ophthalmology;  Laterality: Right;  Diabetic 10.42 01:13.4   COLONOSCOPY WITH PROPOFOL N/A 04/04/2019   Procedure: COLONOSCOPY WITH PROPOFOL;  Surgeon: Toledo, Boykin Nearing, MD;  Location: [facility];  Service: Gastroenterology;  Laterality: N/A;   I & D  EXTREMITY Right 02/01/2020   Procedure: IRRIGATION AND DEBRIDEMENT EXTREMITY with poly exchange;  Surgeon: Donato Heinz, MD;  Location: [facility];  Service: Orthopedics;  Laterality: Right;   INCISION AND DRAINAGE     BACK-MRSA INFECTION AFTER BACK SURGERY   KNEE ARTHROPLASTY Right 01/28/2020   Procedure: COMPUTER ASSISTED TOTAL KNEE ARTHROPLASTY;  Surgeon: Donato Heinz, MD;  Location: [facility];  Service: Orthopedics;  Laterality: Right;   MOUTH SURGERY     right elbow surgery     right knee surgery     right shoulder surgery     from polio damage   TONSILLECTOMY     TOTAL HIP  ARTHROPLASTY Bilateral 04/2016    Family History  Adopted: Yes    Social History   Socioeconomic History   Marital status: Married    Spouse name: Not on file   Number of children: 0   Years of education: Not on file   Highest education level: Not on file  Occupational History   Occupation: Corporate investment banker    Comment: 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   Occupation: Engineering geologist    Comment: Retired   Occupation: Scientist, research (medical)    Comment: Retired  Tobacco Use   Smoking status: Former    Types: Cigars    Quit date: 09/23/1990    Years since quitting: 33.1    Passive exposure: Past (as a child)   Smokeless tobacco: Former    Quit date: 02/25/2006  Vaping Use   Vaping status: Never Used  Substance and Sexual Activity   Alcohol use: No    Alcohol/week: 0.0 standard drinks of alcohol   Drug use: No   Sexual activity: Yes    Partners: Female  Other Topics Concern   Not on file  Social History Narrative   Has living will   Wife is health care POA---then brother or sister   Would accept resuscitation attempts but no prolonged ventilation or tube feeds   Social Drivers of Health   Financial Resource Strain: Low Risk  (07/20/2017)   Overall Financial Resource Strain (CARDIA)    Difficulty of Paying Living Expenses: Not hard at all  Food Insecurity: No Food Insecurity  (08/17/2023)   Hunger Vital Sign    Worried About Running Out of Food in the Last Year: Never true    Ran Out of Food in the Last Year: Never true  Transportation Needs: No Transportation Needs (08/17/2023)   PRAPARE - Administrator, Civil Service (Medical): No    Lack of Transportation (Non-Medical): No  Physical Activity: Not on file  Stress: Not on file  Social Connections: Socially Integrated (08/17/2023)   Social Connection and Isolation Panel [NHANES]    Frequency of Communication with Friends and Family: More than three times a week    Frequency of Social Gatherings with Friends and Family: More than three times a week    Attends Religious Services: More than 4 times per year    Active Member of [employer] or Organizations: Yes    Attends Engineer, structural: More than 4 times per year    Marital Status: Married  Catering manager Violence: Not At Risk (08/17/2023)   Humiliation, Afraid, Rape, and Kick questionnaire    Fear of Current or Ex-Partner: No    Emotionally Abused: No    Physically Abused: No    Sexually Abused: No   Review of Systems Appetite is okay Not sleeping well--sleeps in recliner (can't sleep in bed without CPAP) Wears seat belt Dentures--doesn't see dentist Some heartburn--used OTC med. No dysphagia Bowels move fine Voids okay--stream is strong     Objective:   Physical Exam Constitutional:      Appearance: Normal appearance.  HENT:     Mouth/Throat:     Pharynx: No oropharyngeal exudate or posterior oropharyngeal erythema.  Eyes:     Conjunctiva/sclera: Conjunctivae normal.     Pupils: Pupils are equal, round, and reactive to light.  Cardiovascular:     Rate and Rhythm: Normal rate and regular rhythm.     Heart sounds:     No gallop.     Comments: Gr 2/6 systolic murmur Faint  pedal pulses Pulmonary:     Effort: Pulmonary effort is normal.     Breath sounds: Normal breath sounds. No wheezing or rales.  Abdominal:      Palpations: Abdomen is soft.     Tenderness: There is no abdominal tenderness.  Musculoskeletal:     Cervical back: Neck supple.     Comments: 1+ pedal edema--and tense calves  Lymphadenopathy:     Cervical: No cervical adenopathy.  Skin:    Findings: No rash.     Comments: Several shallow ulcers with eschar. One with inflammation but not clearly infected  Neurological:     General: No focal deficit present.     Mental Status: He is alert and oriented to person, place, and time.     Comments: Mini-cog--- clock numbers and hands no quite right, recall 2/3 Decreased sensation in feet  Psychiatric:        Mood and Affect: Mood normal.        Behavior: Behavior normal.            Assessment & Plan:

## 2023-10-31 NOTE — Assessment & Plan Note (Signed)
 BMI in high 30's with multiple medical issues Tries to exercise and eat for diabetes

## 2023-10-31 NOTE — Assessment & Plan Note (Signed)
 I have personally reviewed the Medicare Annual Wellness questionnaire and have noted 1. The patient's medical and social history 2. Their use of alcohol, tobacco or illicit drugs 3. Their current medications and supplements 4. The patient's functional ability including ADL's, fall risks, home safety risks and hearing or visual             impairment. 5. Diet and physical activities 6. Evidence for depression or mood disorders  The patients weight, height, BMI and visual acuity have been recorded in the chart I have made referrals, counseling and provided education to the patient based review of the above and I have provided the pt with a written personalized care plan for preventive services.  I have provided you with a copy of your personalized plan for preventive services. Please take the time to review along with your updated medication list.  Done with cancer screening Does some exercise Needs flu/COVID and RSV vaccines before next fall

## 2023-10-31 NOTE — Assessment & Plan Note (Signed)
 Superficial and doesn't look infected He is using topical Rx

## 2023-10-31 NOTE — Assessment & Plan Note (Signed)
 PDMP reviewed No concerns

## 2023-11-01 ENCOUNTER — Telehealth: Payer: Self-pay | Admitting: Internal Medicine

## 2023-11-01 NOTE — Telephone Encounter (Signed)
 Spoke to pt. Advised him if he is not having a surgical procedure done where he needed to know if he was still testing positive for Covid, there is nothing that needs to be done. It is recommended that the 1st 5 days of illness he was to wear a mask and quarantine as much as possible. If he is fever free after 5 days, he can go out in public with a mask for 5 days.

## 2023-11-01 NOTE — Telephone Encounter (Signed)
 Copied from CRM [id number]. Topic: Clinical - Medical Advice >> Nov 01, 2023  2:41 PM Fonda Kinder J wrote: Reason for CRM: Pt states he had a physical yesterday but he had COVID prior to that and it wasn't mentioned during his appointment if he still had covid or not. He wants to know if his doctor will be able to tell from his physical or does he need to come in for a screening? Please advise

## 2023-11-03 ENCOUNTER — Telehealth: Payer: Self-pay

## 2023-11-03 DIAGNOSIS — N183 Chronic kidney disease, stage 3 unspecified: Secondary | ICD-10-CM | POA: Diagnosis not present

## 2023-11-03 DIAGNOSIS — D631 Anemia in chronic kidney disease: Secondary | ICD-10-CM | POA: Diagnosis not present

## 2023-11-03 DIAGNOSIS — I480 Paroxysmal atrial fibrillation: Secondary | ICD-10-CM | POA: Diagnosis not present

## 2023-11-03 DIAGNOSIS — H409 Unspecified glaucoma: Secondary | ICD-10-CM | POA: Diagnosis not present

## 2023-11-03 DIAGNOSIS — G8929 Other chronic pain: Secondary | ICD-10-CM | POA: Diagnosis not present

## 2023-11-03 DIAGNOSIS — M545 Low back pain, unspecified: Secondary | ICD-10-CM | POA: Diagnosis not present

## 2023-11-03 DIAGNOSIS — I5032 Chronic diastolic (congestive) heart failure: Secondary | ICD-10-CM | POA: Diagnosis not present

## 2023-11-03 DIAGNOSIS — I4892 Unspecified atrial flutter: Secondary | ICD-10-CM | POA: Diagnosis not present

## 2023-11-03 DIAGNOSIS — E785 Hyperlipidemia, unspecified: Secondary | ICD-10-CM | POA: Diagnosis not present

## 2023-11-03 DIAGNOSIS — G4733 Obstructive sleep apnea (adult) (pediatric): Secondary | ICD-10-CM | POA: Diagnosis not present

## 2023-11-03 DIAGNOSIS — I130 Hypertensive heart and chronic kidney disease with heart failure and stage 1 through stage 4 chronic kidney disease, or unspecified chronic kidney disease: Secondary | ICD-10-CM | POA: Diagnosis not present

## 2023-11-03 DIAGNOSIS — E1122 Type 2 diabetes mellitus with diabetic chronic kidney disease: Secondary | ICD-10-CM | POA: Diagnosis not present

## 2023-11-03 NOTE — Telephone Encounter (Signed)
 Copied from CRM [id number]. Topic: Clinical - Medical Advice >> Nov 03, 2023 10:33 AM Almira Coaster wrote: Reason for CRM: Angie from [facility] is calling to advise that patient is missing his physical therapy appointment this week due to being diagnosed with Covid. Patient was seen in the ER and was prescribed medication. Best call back number for Angie is [phone].

## 2023-11-03 NOTE — Telephone Encounter (Signed)
 I am assuming they are asking for verbal ok to miss the appointment.

## 2023-11-07 ENCOUNTER — Ambulatory Visit: Payer: Self-pay

## 2023-11-07 NOTE — Patient Outreach (Signed)
 Care Coordination   Follow Up Visit Note   11/07/2023 Name: Daniel Kline MRN: [id number] DOB: [birth year]  Daniel Kline is a 78 y.o. year old male who sees [name], MD for primary care. I spoke with  [name] by phone today.  What matters to the patients health and wellness today?  Patient states he is feeling much better from COVID. He states he continues to have some mild congestion and cough however overall symptoms are much better.  Patient denies any increase in HF symptoms. He reports having follow up visit with primary provider on 10/31/23 and has nephrology follow up on 11/15/23. Patient states he was prescribed Farxiaga approximately 2 weeks ago. States he is tolerating well. Patient states he is restarting his exercising today and monitors his diet for diabetes control along with glipizide. Patient reports today's BP is 142/74, fasting BS was 150 and weight 236 lbs. Patient states he continues to wears his compression hose.    Goals Addressed             This Visit's Progress    Management of health conditions       Interventions Today    Flowsheet Row Most Recent Value  Chronic Disease   Chronic disease during today's visit Congestive Heart Failure (CHF), Diabetes, Chronic Kidney Disease/End Stage Renal Disease (ESRD)  General Interventions   General Interventions Discussed/Reviewed General Interventions Reviewed, Doctor Visits, Labs  [evaluation of current treatment plan for listed health condition and patients adherence to plan as established by provider. Assessed for HF symptoms and blood pressure/ blood sugar readings. Assessed for ongoing COVID symptoms.]  Labs Kidney Function  [discussed most recent kidney funtion lab.]  Doctor Visits Discussed/Reviewed Doctor Visits Reviewed  [name] upcoming provider visit. Discussed 10/31/23 primary provider visit. Inquired of patients next follow up nephrology visit.]  Exercise Interventions   Exercise  Discussed/Reviewed Physical Activity  [Inquired if patient has restarted exercising.]  Education Interventions   Education Provided Provided Education  [Reviewed heart failure symptoms. Advised to continuing weighing and monitoring BP readings daily and recording. Advised not notify provider for increase in symptoms and call 911 for severe symptoms/ SOB. Advised to continue wearing compression hose.]  Provided Verbal Education On Blood Sugar Monitoring, Other  [stressed importance of maintaining kidney health. Advised to stay hydrated and avoid NSAIDS.]  Nutrition Interventions   Nutrition Discussed/Reviewed Nutrition Reviewed, Carbohydrate meal planning  [discussed diabetes management with exercise and diet.]  Pharmacy Interventions   Pharmacy Dicussed/Reviewed Pharmacy Topics Reviewed  [Medications reviewed and compliance discussed and advised Discussed recent farxiga medication addition. Assessed for any side effects.]              SDOH assessments and interventions completed:  No     Care Coordination Interventions:  Yes, provided   Follow up plan: Follow up call scheduled for 12/06/23 at 1:30 pm    Encounter Outcome:  Patient Visit Completed   [name] RN, BSN, CCM [place], Population Health Case Manager Phone: [phone]

## 2023-11-07 NOTE — Patient Instructions (Signed)
 Visit Information  Thank you for taking time to visit with me today. Please don't hesitate to contact me if I can be of assistance to you.   Following are the goals we discussed today:   Goals Addressed             This Visit's Progress    Management of health conditions       Interventions Today    Flowsheet Row Most Recent Value  Chronic Disease   Chronic disease during today's visit Congestive Heart Failure (CHF), Diabetes, Chronic Kidney Disease/End Stage Renal Disease (ESRD)  General Interventions   General Interventions Discussed/Reviewed General Interventions Reviewed, Doctor Visits, Labs  [evaluation of current treatment plan for listed health condition and patients adherence to plan as established by provider. Assessed for HF symptoms and blood pressure/ blood sugar readings. Assessed for ongoing COVID symptoms.]  Labs Kidney Function  [discussed most recent kidney funtion lab.]  Doctor Visits Discussed/Reviewed Doctor Visits Reviewed  [name] upcoming provider visit. Discussed 10/31/23 primary provider visit. Inquired of patients next follow up nephrology visit.]  Exercise Interventions   Exercise Discussed/Reviewed Physical Activity  [Inquired if patient has restarted exercising.]  Education Interventions   Education Provided Provided Education  [Reviewed heart failure symptoms. Advised to continuing weighing and monitoring BP readings daily and recording. Advised not notify provider for increase in symptoms and call 911 for severe symptoms/ SOB. Advised to continue wearing compression hose.]  Provided Verbal Education On Blood Sugar Monitoring, Other  [stressed importance of maintaining kidney health. Advised to stay hydrated and avoid NSAIDS.]  Nutrition Interventions   Nutrition Discussed/Reviewed Nutrition Reviewed, Carbohydrate meal planning  [discussed diabetes management with exercise and diet.]  Pharmacy Interventions   Pharmacy Dicussed/Reviewed Pharmacy Topics  Reviewed  [Medications reviewed and compliance discussed and advised Discussed recent farxiga medication addition. Assessed for any side effects.]              Our next appointment is by telephone on 12/06/23 at 1:30 pm  Please call the care guide team at [phone] if you need to cancel or reschedule your appointment.   If you are experiencing a Mental Health or Behavioral Health Crisis or need someone to talk to, please call the Suicide and Crisis Lifeline: 988 call 1-800-273-TALK (toll free, 24 hour hotline)  The patient verbalized understanding of instructions, educational materials, and care plan provided today and agreed to receive a mailed copy of patient instructions, educational materials, and care plan.   [name] RN, BSN, CCM [employer], Population Health Case Manager Phone: [phone]

## 2023-11-09 ENCOUNTER — Other Ambulatory Visit: Payer: Self-pay | Admitting: Internal Medicine

## 2023-11-09 NOTE — Telephone Encounter (Signed)
 Name of Medication: Hydrocodone Name of Pharmacy: [facility] Last Fill or Written Date and Quantity: 10-12-23 #60 Last Office Visit and Type: 10-31-23 Next Office Visit and Type: No Future OV Last Controlled Substance Agreement Date: 01-27-23 Last UDS: 01-27-23

## 2023-11-11 ENCOUNTER — Telehealth: Payer: Self-pay

## 2023-11-11 DIAGNOSIS — Z7984 Long term (current) use of oral hypoglycemic drugs: Secondary | ICD-10-CM | POA: Diagnosis not present

## 2023-11-11 DIAGNOSIS — I4892 Unspecified atrial flutter: Secondary | ICD-10-CM | POA: Diagnosis not present

## 2023-11-11 DIAGNOSIS — G4733 Obstructive sleep apnea (adult) (pediatric): Secondary | ICD-10-CM | POA: Diagnosis not present

## 2023-11-11 DIAGNOSIS — Z96643 Presence of artificial hip joint, bilateral: Secondary | ICD-10-CM | POA: Diagnosis not present

## 2023-11-11 DIAGNOSIS — H409 Unspecified glaucoma: Secondary | ICD-10-CM | POA: Diagnosis not present

## 2023-11-11 DIAGNOSIS — M545 Low back pain, unspecified: Secondary | ICD-10-CM | POA: Diagnosis not present

## 2023-11-11 DIAGNOSIS — I480 Paroxysmal atrial fibrillation: Secondary | ICD-10-CM | POA: Diagnosis not present

## 2023-11-11 DIAGNOSIS — G8929 Other chronic pain: Secondary | ICD-10-CM | POA: Diagnosis not present

## 2023-11-11 DIAGNOSIS — I130 Hypertensive heart and chronic kidney disease with heart failure and stage 1 through stage 4 chronic kidney disease, or unspecified chronic kidney disease: Secondary | ICD-10-CM | POA: Diagnosis not present

## 2023-11-11 DIAGNOSIS — Z96651 Presence of right artificial knee joint: Secondary | ICD-10-CM | POA: Diagnosis not present

## 2023-11-11 DIAGNOSIS — D631 Anemia in chronic kidney disease: Secondary | ICD-10-CM | POA: Diagnosis not present

## 2023-11-11 DIAGNOSIS — I5032 Chronic diastolic (congestive) heart failure: Secondary | ICD-10-CM | POA: Diagnosis not present

## 2023-11-11 DIAGNOSIS — E785 Hyperlipidemia, unspecified: Secondary | ICD-10-CM | POA: Diagnosis not present

## 2023-11-11 DIAGNOSIS — K8020 Calculus of gallbladder without cholecystitis without obstruction: Secondary | ICD-10-CM | POA: Diagnosis not present

## 2023-11-11 DIAGNOSIS — N183 Chronic kidney disease, stage 3 unspecified: Secondary | ICD-10-CM | POA: Diagnosis not present

## 2023-11-11 DIAGNOSIS — I491 Atrial premature depolarization: Secondary | ICD-10-CM | POA: Diagnosis not present

## 2023-11-11 DIAGNOSIS — Z8572 Personal history of non-Hodgkin lymphomas: Secondary | ICD-10-CM | POA: Diagnosis not present

## 2023-11-11 DIAGNOSIS — Z556 Problems related to health literacy: Secondary | ICD-10-CM | POA: Diagnosis not present

## 2023-11-11 DIAGNOSIS — E1122 Type 2 diabetes mellitus with diabetic chronic kidney disease: Secondary | ICD-10-CM | POA: Diagnosis not present

## 2023-11-11 DIAGNOSIS — Z85828 Personal history of other malignant neoplasm of skin: Secondary | ICD-10-CM | POA: Diagnosis not present
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
## 2023-11-15 DIAGNOSIS — R809 Proteinuria, unspecified: Secondary | ICD-10-CM | POA: Diagnosis not present

## 2023-11-15 DIAGNOSIS — N184 Chronic kidney disease, stage 4 (severe): Secondary | ICD-10-CM | POA: Diagnosis not present

## 2023-11-15 DIAGNOSIS — E785 Hyperlipidemia, unspecified: Secondary | ICD-10-CM | POA: Diagnosis not present

## 2023-11-15 DIAGNOSIS — N2581 Secondary hyperparathyroidism of renal origin: Secondary | ICD-10-CM | POA: Diagnosis not present

## 2023-11-15 DIAGNOSIS — I129 Hypertensive chronic kidney disease with stage 1 through stage 4 chronic kidney disease, or unspecified chronic kidney disease: Secondary | ICD-10-CM | POA: Diagnosis not present

## 2023-11-15 DIAGNOSIS — E1122 Type 2 diabetes mellitus with diabetic chronic kidney disease: Secondary | ICD-10-CM | POA: Diagnosis not present

## 2023-11-15 DIAGNOSIS — D631 Anemia in chronic kidney disease: Secondary | ICD-10-CM | POA: Diagnosis not present

## 2023-11-17 ENCOUNTER — Other Ambulatory Visit: Payer: Self-pay | Admitting: Internal Medicine

## 2023-11-21 DIAGNOSIS — H409 Unspecified glaucoma: Secondary | ICD-10-CM | POA: Diagnosis not present

## 2023-11-21 DIAGNOSIS — Z96643 Presence of artificial hip joint, bilateral: Secondary | ICD-10-CM | POA: Diagnosis not present

## 2023-11-21 DIAGNOSIS — D631 Anemia in chronic kidney disease: Secondary | ICD-10-CM | POA: Diagnosis not present

## 2023-11-21 DIAGNOSIS — Z8572 Personal history of non-Hodgkin lymphomas: Secondary | ICD-10-CM | POA: Diagnosis not present

## 2023-11-21 DIAGNOSIS — I4892 Unspecified atrial flutter: Secondary | ICD-10-CM | POA: Diagnosis not present

## 2023-11-21 DIAGNOSIS — Z85828 Personal history of other malignant neoplasm of skin: Secondary | ICD-10-CM | POA: Diagnosis not present

## 2023-11-21 DIAGNOSIS — I5032 Chronic diastolic (congestive) heart failure: Secondary | ICD-10-CM | POA: Diagnosis not present

## 2023-11-21 DIAGNOSIS — I480 Paroxysmal atrial fibrillation: Secondary | ICD-10-CM | POA: Diagnosis not present

## 2023-11-21 DIAGNOSIS — Z96651 Presence of right artificial knee joint: Secondary | ICD-10-CM | POA: Diagnosis not present

## 2023-11-21 DIAGNOSIS — K8020 Calculus of gallbladder without cholecystitis without obstruction: Secondary | ICD-10-CM | POA: Diagnosis not present

## 2023-11-21 DIAGNOSIS — G4733 Obstructive sleep apnea (adult) (pediatric): Secondary | ICD-10-CM | POA: Diagnosis not present

## 2023-11-21 DIAGNOSIS — I130 Hypertensive heart and chronic kidney disease with heart failure and stage 1 through stage 4 chronic kidney disease, or unspecified chronic kidney disease: Secondary | ICD-10-CM | POA: Diagnosis not present

## 2023-11-21 DIAGNOSIS — N183 Chronic kidney disease, stage 3 unspecified: Secondary | ICD-10-CM | POA: Diagnosis not present

## 2023-11-21 DIAGNOSIS — Z7984 Long term (current) use of oral hypoglycemic drugs: Secondary | ICD-10-CM | POA: Diagnosis not present

## 2023-11-21 DIAGNOSIS — G8929 Other chronic pain: Secondary | ICD-10-CM | POA: Diagnosis not present

## 2023-11-21 DIAGNOSIS — Z556 Problems related to health literacy: Secondary | ICD-10-CM | POA: Diagnosis not present

## 2023-11-21 DIAGNOSIS — E1122 Type 2 diabetes mellitus with diabetic chronic kidney disease: Secondary | ICD-10-CM | POA: Diagnosis not present

## 2023-11-21 DIAGNOSIS — I491 Atrial premature depolarization: Secondary | ICD-10-CM | POA: Diagnosis not present

## 2023-11-21 DIAGNOSIS — E785 Hyperlipidemia, unspecified: Secondary | ICD-10-CM | POA: Diagnosis not present

## 2023-11-21 DIAGNOSIS — M545 Low back pain, unspecified: Secondary | ICD-10-CM | POA: Diagnosis not present

## 2023-11-25 ENCOUNTER — Emergency Department

## 2023-11-25 ENCOUNTER — Inpatient Hospital Stay
Admission: EM | Admit: 2023-11-25 | Discharge: 2023-12-12 | DRG: 377 | Disposition: A | Discharge status: Home Health | Attending: Internal Medicine | Admitting: Internal Medicine

## 2023-11-25 ENCOUNTER — Other Ambulatory Visit: Payer: Self-pay

## 2023-11-25 DIAGNOSIS — I129 Hypertensive chronic kidney disease with stage 1 through stage 4 chronic kidney disease, or unspecified chronic kidney disease: Secondary | ICD-10-CM | POA: Diagnosis present

## 2023-11-25 DIAGNOSIS — Z8612 Personal history of poliomyelitis: Secondary | ICD-10-CM

## 2023-11-25 DIAGNOSIS — E7800 Pure hypercholesterolemia, unspecified: Secondary | ICD-10-CM | POA: Diagnosis present

## 2023-11-25 DIAGNOSIS — Z961 Presence of intraocular lens: Secondary | ICD-10-CM | POA: Diagnosis present

## 2023-11-25 DIAGNOSIS — Z1152 Encounter for screening for COVID-19: Secondary | ICD-10-CM | POA: Diagnosis not present

## 2023-11-25 DIAGNOSIS — L03115 Cellulitis of right lower limb: Secondary | ICD-10-CM | POA: Insufficient documentation

## 2023-11-25 DIAGNOSIS — D649 Anemia, unspecified: Secondary | ICD-10-CM

## 2023-11-25 DIAGNOSIS — E8721 Acute metabolic acidosis: Secondary | ICD-10-CM | POA: Diagnosis not present

## 2023-11-25 DIAGNOSIS — N289 Disorder of kidney and ureter, unspecified: Secondary | ICD-10-CM | POA: Diagnosis not present

## 2023-11-25 DIAGNOSIS — D61818 Other pancytopenia: Principal | ICD-10-CM | POA: Insufficient documentation

## 2023-11-25 DIAGNOSIS — K922 Gastrointestinal hemorrhage, unspecified: Secondary | ICD-10-CM | POA: Diagnosis present

## 2023-11-25 DIAGNOSIS — R768 Other specified abnormal immunological findings in serum: Secondary | ICD-10-CM | POA: Diagnosis not present

## 2023-11-25 DIAGNOSIS — N184 Chronic kidney disease, stage 4 (severe): Secondary | ICD-10-CM | POA: Diagnosis present

## 2023-11-25 DIAGNOSIS — G4733 Obstructive sleep apnea (adult) (pediatric): Secondary | ICD-10-CM | POA: Diagnosis present

## 2023-11-25 DIAGNOSIS — N19 Unspecified kidney failure: Secondary | ICD-10-CM | POA: Diagnosis not present

## 2023-11-25 DIAGNOSIS — G9341 Metabolic encephalopathy: Secondary | ICD-10-CM | POA: Diagnosis not present

## 2023-11-25 DIAGNOSIS — M0600 Rheumatoid arthritis without rheumatoid factor, unspecified site: Secondary | ICD-10-CM | POA: Diagnosis present

## 2023-11-25 DIAGNOSIS — K2289 Other specified disease of esophagus: Secondary | ICD-10-CM | POA: Diagnosis not present

## 2023-11-25 DIAGNOSIS — Z96651 Presence of right artificial knee joint: Secondary | ICD-10-CM | POA: Diagnosis not present

## 2023-11-25 DIAGNOSIS — Z79899 Other long term (current) drug therapy: Secondary | ICD-10-CM

## 2023-11-25 DIAGNOSIS — E871 Hypo-osmolality and hyponatremia: Secondary | ICD-10-CM | POA: Diagnosis present

## 2023-11-25 DIAGNOSIS — R234 Changes in skin texture: Secondary | ICD-10-CM | POA: Diagnosis not present

## 2023-11-25 DIAGNOSIS — W19XXXA Unspecified fall, initial encounter: Secondary | ICD-10-CM | POA: Diagnosis present

## 2023-11-25 DIAGNOSIS — K5730 Diverticulosis of large intestine without perforation or abscess without bleeding: Secondary | ICD-10-CM | POA: Diagnosis not present

## 2023-11-25 DIAGNOSIS — I10 Essential (primary) hypertension: Secondary | ICD-10-CM | POA: Diagnosis not present

## 2023-11-25 DIAGNOSIS — R1111 Vomiting without nausea: Secondary | ICD-10-CM | POA: Diagnosis not present

## 2023-11-25 DIAGNOSIS — K2981 Duodenitis with bleeding: Secondary | ICD-10-CM | POA: Diagnosis present

## 2023-11-25 DIAGNOSIS — K219 Gastro-esophageal reflux disease without esophagitis: Secondary | ICD-10-CM | POA: Diagnosis present

## 2023-11-25 DIAGNOSIS — C84A0 Cutaneous T-cell lymphoma, unspecified, unspecified site: Secondary | ICD-10-CM | POA: Diagnosis not present

## 2023-11-25 DIAGNOSIS — R410 Disorientation, unspecified: Secondary | ICD-10-CM | POA: Diagnosis not present

## 2023-11-25 DIAGNOSIS — K2970 Gastritis, unspecified, without bleeding: Secondary | ICD-10-CM | POA: Diagnosis not present

## 2023-11-25 DIAGNOSIS — D709 Neutropenia, unspecified: Secondary | ICD-10-CM | POA: Diagnosis not present

## 2023-11-25 DIAGNOSIS — Z87891 Personal history of nicotine dependence: Secondary | ICD-10-CM | POA: Diagnosis not present

## 2023-11-25 DIAGNOSIS — Z96643 Presence of artificial hip joint, bilateral: Secondary | ICD-10-CM | POA: Diagnosis present

## 2023-11-25 DIAGNOSIS — R4781 Slurred speech: Secondary | ICD-10-CM

## 2023-11-25 DIAGNOSIS — Z91030 Bee allergy status: Secondary | ICD-10-CM

## 2023-11-25 DIAGNOSIS — H409 Unspecified glaucoma: Secondary | ICD-10-CM | POA: Diagnosis present

## 2023-11-25 DIAGNOSIS — K2980 Duodenitis without bleeding: Secondary | ICD-10-CM | POA: Diagnosis not present

## 2023-11-25 DIAGNOSIS — Z9841 Cataract extraction status, right eye: Secondary | ICD-10-CM

## 2023-11-25 DIAGNOSIS — K254 Chronic or unspecified gastric ulcer with hemorrhage: Secondary | ICD-10-CM | POA: Diagnosis not present

## 2023-11-25 DIAGNOSIS — E66812 Obesity, class 2: Secondary | ICD-10-CM | POA: Diagnosis present

## 2023-11-25 DIAGNOSIS — E785 Hyperlipidemia, unspecified: Secondary | ICD-10-CM | POA: Diagnosis present

## 2023-11-25 DIAGNOSIS — K2270 Barrett's esophagus without dysplasia: Secondary | ICD-10-CM | POA: Diagnosis present

## 2023-11-25 DIAGNOSIS — N179 Acute kidney failure, unspecified: Secondary | ICD-10-CM | POA: Diagnosis present

## 2023-11-25 DIAGNOSIS — S0990XA Unspecified injury of head, initial encounter: Secondary | ICD-10-CM | POA: Diagnosis not present

## 2023-11-25 DIAGNOSIS — D631 Anemia in chronic kidney disease: Secondary | ICD-10-CM | POA: Diagnosis not present

## 2023-11-25 DIAGNOSIS — K2990 Gastroduodenitis, unspecified, without bleeding: Secondary | ICD-10-CM | POA: Diagnosis not present

## 2023-11-25 DIAGNOSIS — I480 Paroxysmal atrial fibrillation: Secondary | ICD-10-CM | POA: Diagnosis present

## 2023-11-25 DIAGNOSIS — R519 Headache, unspecified: Secondary | ICD-10-CM | POA: Diagnosis not present

## 2023-11-25 DIAGNOSIS — R109 Unspecified abdominal pain: Secondary | ICD-10-CM | POA: Diagnosis not present

## 2023-11-25 DIAGNOSIS — E1140 Type 2 diabetes mellitus with diabetic neuropathy, unspecified: Secondary | ICD-10-CM | POA: Diagnosis not present

## 2023-11-25 DIAGNOSIS — M069 Rheumatoid arthritis, unspecified: Secondary | ICD-10-CM | POA: Diagnosis present

## 2023-11-25 DIAGNOSIS — K259 Gastric ulcer, unspecified as acute or chronic, without hemorrhage or perforation: Secondary | ICD-10-CM | POA: Diagnosis not present

## 2023-11-25 DIAGNOSIS — Z885 Allergy status to narcotic agent status: Secondary | ICD-10-CM

## 2023-11-25 DIAGNOSIS — D62 Acute posthemorrhagic anemia: Secondary | ICD-10-CM | POA: Diagnosis present

## 2023-11-25 DIAGNOSIS — Z85828 Personal history of other malignant neoplasm of skin: Secondary | ICD-10-CM

## 2023-11-25 DIAGNOSIS — Z7984 Long term (current) use of oral hypoglycemic drugs: Secondary | ICD-10-CM

## 2023-11-25 DIAGNOSIS — K921 Melena: Secondary | ICD-10-CM | POA: Diagnosis not present

## 2023-11-25 DIAGNOSIS — K8020 Calculus of gallbladder without cholecystitis without obstruction: Secondary | ICD-10-CM | POA: Diagnosis not present

## 2023-11-25 DIAGNOSIS — Z6835 Body mass index (BMI) 35.0-35.9, adult: Secondary | ICD-10-CM

## 2023-11-25 DIAGNOSIS — R5381 Other malaise: Secondary | ICD-10-CM | POA: Diagnosis present

## 2023-11-25 DIAGNOSIS — E1122 Type 2 diabetes mellitus with diabetic chronic kidney disease: Secondary | ICD-10-CM | POA: Diagnosis not present

## 2023-11-25 DIAGNOSIS — N200 Calculus of kidney: Secondary | ICD-10-CM | POA: Diagnosis not present

## 2023-11-25 DIAGNOSIS — Z888 Allergy status to other drugs, medicaments and biological substances status: Secondary | ICD-10-CM

## 2023-11-25 DIAGNOSIS — R0902 Hypoxemia: Secondary | ICD-10-CM | POA: Diagnosis not present

## 2023-11-25 LAB — COMPREHENSIVE METABOLIC PANEL WITH GFR
ALT: 45 U/L — ABNORMAL HIGH (ref 0–44)
AST: 38 U/L (ref 15–41)
Albumin: 3.1 g/dL — ABNORMAL LOW (ref 3.5–5.0)
Alkaline Phosphatase: 33 U/L — ABNORMAL LOW (ref 38–126)
Anion gap: 8 (ref 5–15)
BUN: 83 mg/dL — ABNORMAL HIGH (ref 8–23)
CO2: 16 mmol/L — ABNORMAL LOW (ref 22–32)
Calcium: 8.4 mg/dL — ABNORMAL LOW (ref 8.9–10.3)
Chloride: 103 mmol/L (ref 98–111)
Creatinine, Ser: 3.88 mg/dL — ABNORMAL HIGH (ref 0.61–1.24)
GFR, Estimated: 15 mL/min — ABNORMAL LOW (ref >60)
Glucose, Bld: 105 mg/dL — ABNORMAL HIGH (ref 70–99)
Potassium: 4 mmol/L (ref 3.5–5.1)
Sodium: 127 mmol/L — ABNORMAL LOW (ref 135–145)
Total Bilirubin: 0.7 mg/dL (ref 0.0–1.2)
Total Protein: 5.8 g/dL — ABNORMAL LOW (ref 6.5–8.1)

## 2023-11-25 LAB — CBC
HCT: 18.9 % — ABNORMAL LOW (ref 39.0–52.0)
Hemoglobin: 6.6 g/dL — ABNORMAL LOW (ref 13.0–17.0)
MCH: 33.5 pg (ref 26.0–34.0)
MCHC: 34.9 g/dL (ref 30.0–36.0)
MCV: 95.9 fL (ref 80.0–100.0)
Platelets: <5 10*3/uL — CL (ref 150–400)
RBC: 1.97 MIL/uL — ABNORMAL LOW (ref 4.22–5.81)
RDW: 14.4 % (ref 11.5–15.5)
WBC: 0.3 10*3/uL — CL (ref 4.0–10.5)
nRBC: 6.5 % — ABNORMAL HIGH (ref 0.0–0.2)

## 2023-11-25 LAB — PROTIME-INR
INR: 1.2 (ref 0.8–1.2)
Prothrombin Time: 15 s (ref 11.4–15.2)

## 2023-11-25 LAB — PREPARE RBC (CROSSMATCH)

## 2023-11-25 MED ORDER — FOLIC ACID 1 MG PO TABS
1.0000 mg | ORAL_TABLET | Freq: Every day | ORAL | Status: DC
  Administered 2023-11-25 – 2023-12-11 (×16): 1 mg via ORAL
  Filled 2023-11-25 (×19): qty 1

## 2023-11-25 MED ORDER — SODIUM CHLORIDE 0.9 % IV BOLUS
1000.0000 mL | Freq: Once | INTRAVENOUS | Status: AC
  Administered 2023-11-25: 1000 mL via INTRAVENOUS

## 2023-11-25 MED ORDER — SODIUM CHLORIDE 0.9 % IV SOLN: 10.0000 mL/h | Freq: Once | INTRAVENOUS | Status: DC

## 2023-11-25 MED ORDER — PANTOPRAZOLE SODIUM 40 MG IV SOLR
40.0000 mg | INTRAVENOUS | Status: AC
Start: 2023-11-25 — End: 2023-11-25
  Administered 2023-11-25 (×2): 40 mg via INTRAVENOUS
  Filled 2023-11-25: qty 10

## 2023-11-25 MED ORDER — HYDRALAZINE HCL 20 MG/ML IJ SOLN
5.0000 mg | Freq: Four times a day (QID) | INTRAMUSCULAR | Status: AC | PRN
  Administered 2023-11-28: 5 mg via INTRAVENOUS
  Filled 2023-11-25 (×2): qty 1

## 2023-11-25 MED ORDER — SENNOSIDES-DOCUSATE SODIUM 8.6-50 MG PO TABS: 1.0000 | ORAL_TABLET | Freq: Every evening | ORAL | Status: DC | PRN

## 2023-11-25 MED ORDER — ONDANSETRON HCL 4 MG/2ML IJ SOLN: 4.0000 mg | Freq: Four times a day (QID) | INTRAMUSCULAR | Status: AC | PRN

## 2023-11-25 MED ORDER — FILGRASTIM-AAFI 300 MCG/0.5ML IJ SOSY
300.0000 ug | PREFILLED_SYRINGE | Freq: Every day | INTRAMUSCULAR | Status: DC
  Administered 2023-11-25 – 2023-11-28 (×4): 300 ug via SUBCUTANEOUS
  Filled 2023-11-25 (×8): qty 0.5

## 2023-11-25 MED ORDER — PANTOPRAZOLE SODIUM 40 MG IV SOLR
40.0000 mg | Freq: Two times a day (BID) | INTRAVENOUS | Status: DC
  Administered 2023-11-26 – 2023-12-01 (×11): 40 mg via INTRAVENOUS
  Filled 2023-11-25 (×12): qty 10

## 2023-11-25 MED ORDER — ACETAMINOPHEN 325 MG PO TABS
650.0000 mg | ORAL_TABLET | Freq: Four times a day (QID) | ORAL | Status: AC | PRN
  Administered 2023-11-26 – 2023-11-30 (×3): 650 mg via ORAL
  Filled 2023-11-25 (×4): qty 2

## 2023-11-25 MED ORDER — DEXTROSE 50 % IV SOLN: 1.0000 | INTRAVENOUS | Status: DC | PRN

## 2023-11-25 MED ORDER — ONDANSETRON HCL 4 MG/2ML IJ SOLN
4.0000 mg | Freq: Once | INTRAMUSCULAR | Status: AC
  Administered 2023-11-25: 4 mg via INTRAVENOUS
  Filled 2023-11-25: qty 2

## 2023-11-25 MED ORDER — SODIUM CHLORIDE 0.9 % IV SOLN
10.0000 mL/h | Freq: Once | INTRAVENOUS | Status: AC
  Administered 2023-11-25: 10 mL/h via INTRAVENOUS

## 2023-11-25 MED ORDER — ONDANSETRON HCL 4 MG PO TABS: 4.0000 mg | ORAL_TABLET | Freq: Four times a day (QID) | ORAL | Status: AC | PRN

## 2023-11-25 MED ORDER — ACETAMINOPHEN 650 MG RE SUPP: 650.0000 mg | Freq: Four times a day (QID) | RECTAL | Status: AC | PRN
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
## 2023-11-25 NOTE — ED Provider Notes (Signed)
 [name] Provider Note    Event Date/Time   First MD Initiated Contact with Patient 11/25/23 1252     (approximate)  History   Chief Complaint: Fall  HPI  GAYLE COLLARD is a 78 y.o. male with a past medical history of anxiety, CKD, diabetes, hypertension, hyperlipidemia, prior polio, presents to the emergency department with a fall.  According to report patient slid out of his chair and landed on his buttocks.  Was too weak to get up even with assistance.  Patient is slow to answer questions, EMS states family reports this is not patient's baseline.  They have also noted the patient has had dark emesis with dark stool recently and have been complaining of some left-sided abdominal pain although patient denies currently.  Physical Exam   Triage Vital Signs: ED Triage Vitals [11/25/23 1250]  Encounter Vitals Group     BP      Systolic BP Percentile      Diastolic BP Percentile      Pulse      Resp      Temp (P) 97.8 F (36.6 C)     Temp Source (P) Oral     SpO2      Weight      Height      Head Circumference      Peak Flow      Pain Score      Pain Loc      Pain Education      Exclude from Growth Chart     Most recent vital signs: Vitals:   11/25/23 1250 11/25/23 1302  Temp: (P) 97.8 F (36.6 C) 97.8 F (36.6 C)    General: Awake, no distress.  Dried vomitus on sweatshirt. CV:  Good peripheral perfusion.  Regular rate and rhythm  Resp:  Normal effort.  Equal breath sounds bilaterally.  Abd:  No distention.  Soft, nontender.  No rebound or guarding.  Benign abdomen. Other:  Nontender rectal exam with dark stool strongly guaiac positive.   ED Results / Procedures / Treatments   RADIOLOGY  CT pending   MEDICATIONS ORDERED IN ED: Medications  pantoprazole  (PROTONIX ) injection 40 mg (has no administration in time range)    Followed by  pantoprazole  (PROTONIX ) injection 40 mg (has no administration in time range)  sodium  chloride 0.9 % bolus 1,000 mL (has no administration in time range)  ondansetron  (ZOFRAN ) injection 4 mg (has no administration in time range)     IMPRESSION / MDM / ASSESSMENT AND PLAN / ED COURSE  I reviewed the triage vital signs and the nursing notes.  Patient's presentation is most consistent with acute presentation with potential threat to life or bodily function.  Patient presents to the emergency department with altered mental status/slowed responses weakness and dark stool/emesis.  Rectal examination shows dark stool strongly guaiac positive consistent with GI bleed.  Given the complaint/report of dark emesis earlier we will start the patient on Protonix .  Will check labs including a type and screen.  Given the report of left-sided abdominal pain we will obtain a CT scan of the abdomen/pelvis to evaluate although without contrast given the patient's chronic kidney disease.  We will IV hydrate and treat with nausea medication while awaiting results.    Patient's workup shows acute on chronic renal insufficiency with a creatinine of 3.88 with hyponatremia with a sodium of 127.  Patient receiving IV fluids.  INR 1.2 is reassuring.  Patient CT scan  of the abdomen/pelvis as well as head and CBC are pending.  Patient will require admission to the hospital service once the patient's emergency department workup is been completed.  Patient care signed out to oncoming provider.  FINAL CLINICAL IMPRESSION(S) / ED DIAGNOSES   Upper GI bleed Weakness  Note:  This document was prepared using Dragon voice recognition software and may include unintentional dictation errors.   Ruth Cove, MD 11/25/23 [phone]

## 2023-11-25 NOTE — Assessment & Plan Note (Signed)
 Hydralazine 5 mg IV every 6 hours as needed for SBP greater 165, 4 days ordered

## 2023-11-25 NOTE — ED Notes (Signed)
 Lab bedside.

## 2023-11-25 NOTE — Assessment & Plan Note (Addendum)
 Severe Check B12, add lab to prior collection Filgrastim  300 mcg daily until ANC is greater than 1.5 per hematology recommendation

## 2023-11-25 NOTE — Assessment & Plan Note (Addendum)
 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  As patient may need cross coverage provider to order more blood products. Goal hgb > 8, goal platelet > 20 at this time Gastroenterology has been consulted Will keep patient clear liquid diet at this time Discussed with cross coverage provider
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
## 2023-11-25 NOTE — Consult Note (Signed)
 [facility]  [phone]) [phone] [phone]) [phone]  ID: Daniel Kline OB: Aug 21, 1945  MR#: [id number]  UYQ#:[id number]  Patient Care Team: Helaine Llanos, MD as PCP - General (Internal Medicine) Devorah Fonder, MD as PCP - Cardiology (Cardiology) Jonathan Neighbor, [facility] (Inactive) as Pharmacist (Pharmacist) Green, Davina E, RN as [facility] Care Management  CHIEF COMPLAINT: Pancytopenia.  INTERVAL HISTORY: Patient is a 78 year old male who presented to the emergency room with increased weakness and a fall.  He was discovered to have a severe pancytopenia and is admitted for observation.  He is a poor historian and slow to answer questions, but previously patient's family reported this is not his baseline.  He has no neurologic complaints.  He denies any recent fevers or illnesses.  He has a good appetite and denies weight loss.  He has no chest pain, shortness of breath, cough, or hemoptysis.  He denies any nausea, vomiting, constipation, or diarrhea.  Family also reported dark emesis and dark stools recently with left-sided abdominal pain.  He has no urinary complaints.  Patient offers no further specific complaints today.  REVIEW OF SYSTEMS:   Review of Systems  Constitutional:  Positive for malaise/fatigue. Negative for fever and weight loss.  Respiratory: Negative.  Negative for cough, hemoptysis and shortness of breath.   Cardiovascular: Negative.  Negative for chest pain and leg swelling.  Gastrointestinal:  Positive for melena and vomiting. Negative for abdominal pain.  Genitourinary:  Positive for flank pain. Negative for dysuria.  Musculoskeletal:  Negative for back pain.  Skin: Negative.  Negative for rash.  Neurological:  Positive for weakness. Negative for dizziness, focal weakness and headaches.  Psychiatric/Behavioral: Negative.  The patient is not nervous/anxious.     As per HPI. Otherwise, a complete review of systems is  negative.  PAST MEDICAL HISTORY: Past Medical History:  Diagnosis Date   Anemia    H/O   Anxiety    Arthritis    Atrial flutter (HCC)    a. s/p post ablation in 04/2017   Chronic kidney disease    Complication of anesthesia    CTCL (cutaneous T-cell lymphoma) (HCC)    Diabetes mellitus without complication (HCC)    Diastolic dysfunction    a. 03/2022 Echo: EF 60-65%, no rwma, GrI DD, nl RV fxn.   Dysplastic nevus 12/19/2017   Right distal lat. forearm near wrist. Severe atypia, close to peripheral margin.   Dysplastic nevus 06/21/2018   Upper back right paraspinal. Severe atypia, peripheral margin involved. Excised 07/11/2018, margins free.   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 forearm. SCCis, hypertrophic.    PAST SURGICAL HISTORY: Past Surgical History:  Procedure Laterality Date   BACK SURGERY     LUMBAR   CARDIAC ELECTROPHYSIOLOGY STUDY AND ABLATION  2019   CATARACT EXTRACTION W/PHACO Right 05/05/2022   Procedure: CATARACT EXTRACTION PHACO AND INTRAOCULAR LENS PLACEMENT (IOC) RIGHT;  Surgeon: Annell Kidney, MD;  Location: [facility];  Service: Ophthalmology;  Laterality: Right;  Diabetic 10.42 01:13.4   COLONOSCOPY WITH PROPOFOL  N/A 04/04/2019   Procedure: COLONOSCOPY WITH PROPOFOL ;  Surgeon: Toledo, Alphonsus Jeans, MD;  Location: [facility];  Service: Gastroenterology;  Laterality: N/A;   I & D EXTREMITY Right 02/01/2020   Procedure: IRRIGATION AND DEBRIDEMENT EXTREMITY with poly  exchange;  Surgeon: Arlyne Lame, MD;  Location: [facility];  Service: Orthopedics;  Laterality: Right;   INCISION AND DRAINAGE     BACK-MRSA INFECTION AFTER BACK SURGERY   KNEE ARTHROPLASTY Right 01/28/2020   Procedure: COMPUTER ASSISTED TOTAL KNEE ARTHROPLASTY;  Surgeon: Arlyne Lame, MD;  Location: [facility];  Service: Orthopedics;  Laterality: Right;   MOUTH SURGERY     right elbow surgery     right knee surgery     right shoulder surgery     from polio damage   TONSILLECTOMY     TOTAL HIP ARTHROPLASTY Bilateral 04/2016    FAMILY HISTORY: Family History  Adopted: Yes    ADVANCED DIRECTIVES (Y/N):  @ADVDIR @  HEALTH MAINTENANCE: Social History   Tobacco Use   Smoking status: Former    Types: Cigars    Quit date: 09/23/1990    Years since quitting: 33.1    Passive exposure: Past (as a child)   Smokeless tobacco: Former    Quit date: 02/25/2006  Vaping Use   Vaping status: Never Used  Substance Use Topics   Alcohol use: No    Alcohol/week: 0.0 standard drinks of alcohol   Drug use: No     Colonoscopy:  PAP:  Bone density:  Lipid panel:  Allergies  Allergen Reactions   Bee Venom Anaphylaxis   Oxycodone  Other (See Comments)    Delusions   Hydromorphone  Other (See Comments)    hallucinating   Hydroxychloroquine     Other reaction(s): Other (See Comments) He broke out really badly.   Zolpidem Other (See Comments)    Current Facility-Administered Medications  Medication Dose Route Frequency Provider Last Rate Last Admin   0.9 %  sodium chloride  infusion  10 mL/hr Intravenous Once Kandee Orion, MD       0.9 %  sodium chloride  infusion  10 mL/hr Intravenous Once Kandee Orion, MD       [START ON 11/26/2023] pantoprazole  (PROTONIX ) injection 40 mg  40 mg Intravenous Q12H Ruth Cove, MD       Current Outpatient Medications  Medication Sig Dispense Refill   amLODipine  (NORVASC ) 5 MG tablet Take 1 tablet (5 mg total) by mouth daily. 90 tablet 3    calcitRIOL (ROCALTROL) 0.25 MCG capsule Take 0.25 mcg by mouth daily.     calcium  carbonate (OSCAL) 1500 (600 Ca) MG TABS tablet Take 600 mg of elemental calcium  by mouth daily with breakfast.     carvedilol  (COREG ) 3.125 MG tablet TAKE 1 TABLET BY MOUTH 2 TIMES DAILY WITH A MEAL. 180 tablet 3   Cholecalciferol  (VITAMIN D -3) 25 MCG (1000 UT) CAPS Take 1,000 Units by mouth daily.     clorazepate  (TRANXENE ) 7.5 MG tablet TAKE 1 TABLET BY MOUTH TWICE A DAY AS NEEDED FOR ANXIETY 60 tablet 0   cyanocobalamin (VITAMIN B12) 500 MCG tablet Take 1,000 mcg by mouth daily.     dapagliflozin propanediol (FARXIGA) 5 MG TABS tablet Take 5 mg by mouth daily.     EPINEPHrine  0.3 mg/0.3 mL IJ SOAJ injection Inject 0.3 mg into the muscle as needed for  anaphylaxis. 1 each 5   Ferrous Sulfate  (IRON PO) Take 1 tablet by mouth at bedtime.     folic acid  (FOLVITE ) 1 MG tablet Take 1 mg by mouth daily. Every day except day that he takes methotrexate      furosemide  (LASIX ) 40 MG tablet Take 1 tablet (40 mg total) by mouth daily. 30 tablet 0   gabapentin  (NEURONTIN ) 300 MG capsule TAKE 1 CAPSULE BY MOUTH 3 TIMES  DAILY 300 capsule 3   glipiZIDE  (GLUCOTROL  XL) 5 MG 24 hr tablet Take 5 mg by mouth daily with breakfast.     glucose blood (ONETOUCH VERIO) test strip Use to check blood sugar once daily 100 each 12   HYDROcodone -acetaminophen  (NORCO/VICODIN) 5-325 MG tablet TAKE 1 TABLET BY MOUTH EVERY 6 HOURS AS NEEDED FOR PAIN 60 tablet 0   hydrocortisone  2.5 % cream Apply topically 3 (three) times daily as needed. 56 g 3   hydrOXYzine  (ATARAX ) 25 MG tablet TAKE ONE TABLET EVERY EIGHT HOURS AS NEEDED 90 tablet 3   isosorbide  mononitrate (IMDUR ) 60 MG 24 hr tablet Take 1 tablet (60 mg total) by mouth daily before lunch. 90 tablet 3   latanoprost  (XALATAN ) 0.005 % ophthalmic solution Place 1 drop into both eyes at bedtime.     losartan  (COZAAR ) 100 MG tablet TAKE 1 TABLET BY MOUTH DAILY 90 tablet 3   MAGNESIUM  PO Take 1 tablet  by mouth daily.     methotrexate  (RHEUMATREX) 2.5 MG tablet Take 5 mg by mouth once a week.     rosuvastatin  (CRESTOR ) 10 MG tablet Take 1 tablet (10 mg total) by mouth daily. 90 tablet 3   spironolactone  (ALDACTONE ) 25 MG tablet Take 25 mg by mouth daily.      OBJECTIVE: Vitals:   11/25/23 1409 11/25/23 1638  BP:  (!) 142/75  Pulse:  78  Resp:  16  Temp:  97.6 F (36.4 C)  SpO2: 98%      Body mass index is 38.58 kg/m.    ECOG FS:3 - Symptomatic, >50% confined to bed  General: Well-developed, well-nourished, no acute distress. Eyes: Pink conjunctiva, anicteric sclera. HEENT: Normocephalic, moist mucous membranes. Lungs: No audible wheezing or coughing. Heart: Regular rate and rhythm. Abdomen: Soft, nontender, no obvious distention. Musculoskeletal: No edema, cyanosis, or clubbing. Neuro: Alert, answering all questions appropriately. Cranial nerves grossly intact. Skin: No rashes or petechiae noted. Psych: Normal affect. Lymphatics: No cervical, calvicular, axillary or inguinal LAD.   LAB RESULTS:  Lab Results  Component Value Date   NA 127 (L) 11/25/2023   K 4.0 11/25/2023   CL 103 11/25/2023   CO2 16 (L) 11/25/2023   GLUCOSE 105 (H) 11/25/2023   BUN 83 (H) 11/25/2023   CREATININE 3.88 (H) 11/25/2023   CALCIUM  8.4 (L) 11/25/2023   PROT 5.8 (L) 11/25/2023   ALBUMIN 3.1 (L) 11/25/2023   AST 38 11/25/2023   ALT 45 (H) 11/25/2023   ALKPHOS 33 (L) 11/25/2023   BILITOT 0.7 11/25/2023   GFRNONAA 15 (L) 11/25/2023   GFRAA 43 (L) 02/01/2020    Lab Results  Component Value Date   WBC 0.3 (LL) 11/25/2023   NEUTROABS 2.6 10/26/2023   HGB 6.6 (L) 11/25/2023   HCT 18.9 (L) 11/25/2023   MCV 95.9 11/25/2023   PLT <5 (LL) 11/25/2023     STUDIES: CT HEAD WO CONTRAST ( ) Result Date: 11/25/2023 CLINICAL DATA:  Head trauma, minor.  Headache. EXAM: CT HEAD WITHOUT CONTRAST TECHNIQUE: Contiguous axial images were obtained from  the base of the skull through the vertex  without intravenous contrast. RADIATION DOSE REDUCTION: This exam was performed according to the departmental dose-optimization program which includes automated exposure control, adjustment of the mA and/or kV according to patient size and/or use of iterative reconstruction technique. COMPARISON:  Head CT 09/05/2022. FINDINGS: Brain: No acute hemorrhage. 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 Gray-white differentiation is otherwise preserved. No hydrocephalus or extra-axial collection. No mass effect or midline shift. Vascular: No hyperdense vessel. Atherosclerotic calcifications of the carotid siphons and intracranial portions of the vertebral arteries. Skull: No calvarial fracture or suspicious bone lesion. Skull base is unremarkable. 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 Orbits are unremarkable. Other: None. IMPRESSION: 1. No evidence of acute intracranial injury. 2. 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 3. 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 Electronically Signed   By: Audra Blend M.D.   On: 11/25/2023 15:29    ASSESSMENT: Pancytopenia.  PLAN:    Pancytopenia: Patient noted to have severe neutropenia, hemoglobin of 6.6 and severe thrombocytopenia with a platelet count of 5.  It is possible this is secondary to methotrexate  toxicity since patient reports he continues to take 5 mg methotrexate  daily despite his worsening renal function.  Discontinue methotrexate  at this time.  Will consider bone marrow biopsy in the future if blood counts do not resolve with discontinuing of methotrexate  and supportive care.  Folic acid  has also been ordered. Neutropenia: Granix  300 mg daily until ANC greater than 1.5. Anemia: Transfuse to maintain hemoglobin greater than 8.0. Thrombocytopenia: Transfuse to maintain platelet count greater than 20 with possible GI bleed. Worsening renal  failure: Likely multifactorial.  Consider nephrology consult. Slow speech/confusion: CT of the head did not reveal any bleed or evidence of acute intracranial injury. Possible GI bleed: Patient will likely need EGD in the near future.  Appreciate consult, will follow.    Shellie Dials, MD   11/25/2023 4:40 PM

## 2023-11-25 NOTE — ED Notes (Signed)
 Pt to CT

## 2023-11-25 NOTE — Progress Notes (Signed)
 VAST consult received to obtain a second IV access. 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 Pt is to receive blood products at this time, but is not due any more medications until after midnight. To allow for vein preservation will hold off on IV placement until second IV is needed.

## 2023-11-25 NOTE — Hospital Course (Addendum)
 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  He required 1 unit of PRBC; EGD showed changes suspicious for Barrett's esophagus, non-bleeding gastric ulcer, and gastritis/duodenitis.  He is recommended for outpatient EGD with biopsy.  Also with pancytopenia in the setting of methotrexate  use; he was treated with Filgrastim  x 3 days with improvement.  He is stable for SNF rehab.

## 2023-11-25 NOTE — ED Notes (Signed)
 IV team at bedside

## 2023-11-25 NOTE — Assessment & Plan Note (Signed)
 -  This complicates overall care and prognosis.

## 2023-11-25 NOTE — ED Notes (Signed)
 Pt diffiuclt stick 2 Rns attempted IV and labs, IV team consulted.

## 2023-11-25 NOTE — Assessment & Plan Note (Signed)
-   Protonix 40 mg IV twice daily

## 2023-11-25 NOTE — Assessment & Plan Note (Signed)
Continue outpatient follow-up with hematologist as appropriate

## 2023-11-25 NOTE — ED Triage Notes (Signed)
 S: - Pt arrives via [facility] for fall frome  B: - Medical Hx: DM< CHF, HTN, Kidney failure  - Pertinent Medications: Unknown at this time  A: - EMS Assessment: R. Arm had polio as a kid cannot extend  - EMS Vitals: BP: 133/76, O2 98% RA, 80 BPM, CBG 127, 98.7 oral temp, 12 lead unremarkable  - EMS interventions: Is Pt on O2? Is this their baseline? - This RNs Assessment: Airway - Intact  Breathing - 98% RA  Disability - Follows commands for this RN currently A/x3, disoriented to date R: - What brought Pt into ER today? Pt Slid of of his chair, no rported injury. Wife reports dark stool and dark emesis, C/o L. Sided abd. Pain, no reported blood thinners. A/Ox3, slow to answering questions for EMS, this is not Pts baseline.

## 2023-11-25 NOTE — Assessment & Plan Note (Signed)
 Home glipizide  will not be resumed on admission

## 2023-11-25 NOTE — Assessment & Plan Note (Signed)
 Methotrexate  will not be resumed on admission

## 2023-11-25 NOTE — ED Provider Notes (Signed)
  Physical Exam  BP (!) 152/60   Pulse 83   Temp 97.8 F (36.6 C) (Oral)   Resp 17   Ht 5\' 6"  (1.676 m)   SpO2 98%   BMI 38.58 kg/m   Physical Exam  Procedures  .Critical Care  Performed by: Kandee Orion, MD Authorized by: Kandee Orion, MD   Critical care provider statement:    Critical care time (minutes):  30   Critical care was necessary to treat or prevent imminent or life-threatening deterioration of the following conditions: pancytopenia.   Critical care was time spent personally by me on the following activities:  Development of treatment plan with patient or surrogate, discussions with consultants, evaluation of patient's response to treatment, examination of patient, ordering and review of laboratory studies, ordering and review of radiographic studies, ordering and performing treatments and interventions, pulse oximetry, re-evaluation of patient's condition and review of old charts   I assumed direction of critical care for this patient from another provider in my specialty: no     Care discussed with: admitting provider     ED Course / MDM   Clinical Course as of 11/25/23 1548  Fri Nov 25, 2023  1533 Platelets(!!): <5 [DW]  1533 WBC(!!): 0.3 [DW]  1533 Hemoglobin(!): 6.6 [DW]  1544 Spoke with hematology.  Agrees with 1 unit PRBC and 2 units platelets at this time.  Plan for bone marrow biopsy on Monday [DW]    Clinical Course User Index [DW] Kandee Orion, MD   Medical Decision Making Amount and/or Complexity of Data Reviewed Labs: ordered. Decision-making details documented in ED Course. Radiology: ordered.  Risk Prescription drug management.   Received signout on patient.  78 year old male presenting today for fall.  Also having extreme weakness and noted bloody stools.  Initial provider saw him with overwhelming melanotic stools.  Vital signs otherwise stable.  Laboratory workup showed AKI on CKD.  CBC and imaging pending at time of signout.  CBC  comes back showing profound leukopenia at 0.3, thrombocytopenia with platelets less than 5, and anemia with hemoglobin 6.6.  Will transfuse 1 unit PRBC and 2 units platelets.  Spoke with hematologist who agrees with that plan and plan for bone marrow biopsy on Monday.  CT abdomen/pelvis with no acute pathology.  Known cyst consistent with his baseline.  Spoke with hematology team who thinks potentially this could be related to his methotrexate  use in the setting of AKI on CKD.  Will temporarily hold off on bone marrow biopsy on Monday and continue to follow his labs.  Patient admitted to hospitalist for further care.     Kandee Orion, MD 11/25/23 [id number]
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
## 2023-11-26 DIAGNOSIS — I480 Paroxysmal atrial fibrillation: Secondary | ICD-10-CM

## 2023-11-26 DIAGNOSIS — M0600 Rheumatoid arthritis without rheumatoid factor, unspecified site: Secondary | ICD-10-CM

## 2023-11-26 DIAGNOSIS — D61818 Other pancytopenia: Secondary | ICD-10-CM | POA: Diagnosis not present

## 2023-11-26 DIAGNOSIS — N179 Acute kidney failure, unspecified: Secondary | ICD-10-CM

## 2023-11-26 DIAGNOSIS — R768 Other specified abnormal immunological findings in serum: Secondary | ICD-10-CM

## 2023-11-26 DIAGNOSIS — C84A0 Cutaneous T-cell lymphoma, unspecified, unspecified site: Secondary | ICD-10-CM

## 2023-11-26 DIAGNOSIS — K922 Gastrointestinal hemorrhage, unspecified: Secondary | ICD-10-CM | POA: Diagnosis not present

## 2023-11-26 DIAGNOSIS — R234 Changes in skin texture: Secondary | ICD-10-CM

## 2023-11-26 DIAGNOSIS — D62 Acute posthemorrhagic anemia: Secondary | ICD-10-CM

## 2023-11-26 LAB — VITAMIN B12: Vitamin B-12: 385 pg/mL (ref 180–914)

## 2023-11-26 LAB — BPAM PLATELET PHERESIS
Blood Product Expiration Date: 202504212359
Blood Product Expiration Date: 202504222359
ISSUE DATE / TIME: 202504182112
ISSUE DATE / TIME: 202504182346
Unit Type and Rh: 5100
Unit Type and Rh: 5100

## 2023-11-26 LAB — PREPARE PLATELET PHERESIS
Unit division: 0
Unit division: 0

## 2023-11-26 LAB — CBC WITH DIFFERENTIAL/PLATELET
Abs Immature Granulocytes: 0 10*3/uL (ref 0.00–0.07)
Basophils Absolute: 0 10*3/uL (ref 0.0–0.1)
Basophils Relative: 0 %
Eosinophils Absolute: 0.1 10*3/uL (ref 0.0–0.5)
Eosinophils Relative: 11 %
HCT: 19.7 % — ABNORMAL LOW (ref 39.0–52.0)
Hemoglobin: 7.1 g/dL — ABNORMAL LOW (ref 13.0–17.0)
Immature Granulocytes: 0 %
Lymphocytes Relative: 46 %
Lymphs Abs: 0.3 10*3/uL — ABNORMAL LOW (ref 0.7–4.0)
MCH: 32.6 pg (ref 26.0–34.0)
MCHC: 36 g/dL (ref 30.0–36.0)
MCV: 90.4 fL (ref 80.0–100.0)
Monocytes Absolute: 0.2 10*3/uL (ref 0.1–1.0)
Monocytes Relative: 30 %
Neutro Abs: 0.1 10*3/uL — CL (ref 1.7–7.7)
Neutrophils Relative %: 13 %
Platelets: 53 10*3/uL — ABNORMAL LOW (ref 150–400)
RBC: 2.18 MIL/uL — ABNORMAL LOW (ref 4.22–5.81)
RDW: 15.7 % — ABNORMAL HIGH (ref 11.5–15.5)
Smear Review: DECREASED
WBC: 0.5 10*3/uL — CL (ref 4.0–10.5)
nRBC: 5.6 % — ABNORMAL HIGH (ref 0.0–0.2)

## 2023-11-26 LAB — TYPE AND SCREEN
ABO/RH(D): O POS
Antibody Screen: NEGATIVE
Unit division: 0

## 2023-11-26 LAB — IRON AND TIBC
Iron: 47 ug/dL (ref 45–182)
Saturation Ratios: 27 % (ref 17.9–39.5)
TIBC: 175 ug/dL — ABNORMAL LOW (ref 250–450)
UIBC: 128 ug/dL

## 2023-11-26 LAB — COMPREHENSIVE METABOLIC PANEL WITH GFR
ALT: 44 U/L (ref 0–44)
AST: 35 U/L (ref 15–41)
Albumin: 2.9 g/dL — ABNORMAL LOW (ref 3.5–5.0)
Alkaline Phosphatase: 33 U/L — ABNORMAL LOW (ref 38–126)
Anion gap: 8 (ref 5–15)
BUN: 75 mg/dL — ABNORMAL HIGH (ref 8–23)
CO2: 17 mmol/L — ABNORMAL LOW (ref 22–32)
Calcium: 8.3 mg/dL — ABNORMAL LOW (ref 8.9–10.3)
Chloride: 106 mmol/L (ref 98–111)
Creatinine, Ser: 3.55 mg/dL — ABNORMAL HIGH (ref 0.61–1.24)
GFR, Estimated: 17 mL/min — ABNORMAL LOW (ref >60)
Glucose, Bld: 88 mg/dL (ref 70–99)
Potassium: 4.2 mmol/L (ref 3.5–5.1)
Sodium: 131 mmol/L — ABNORMAL LOW (ref 135–145)
Total Bilirubin: 1.1 mg/dL (ref 0.0–1.2)
Total Protein: 5.3 g/dL — ABNORMAL LOW (ref 6.5–8.1)

## 2023-11-26 LAB — FERRITIN: Ferritin: 585 ng/mL — ABNORMAL HIGH (ref 24–336)

## 2023-11-26 LAB — BPAM RBC
Blood Product Expiration Date: 202505202359
ISSUE DATE / TIME: 202504181622
Unit Type and Rh: 5100

## 2023-11-26 LAB — CK: Total CK: 443 U/L — ABNORMAL HIGH (ref 49–397)

## 2023-11-26 LAB — IMMATURE PLATELET FRACTION: Immature Platelet Fraction: 1.9 % (ref 1.2–8.6)

## 2023-11-26 MED ORDER — ROSUVASTATIN CALCIUM 10 MG PO TABS
10.0000 mg | ORAL_TABLET | Freq: Every day | ORAL | Status: DC
  Administered 2023-11-26 – 2023-12-11 (×15): 10 mg via ORAL
  Filled 2023-11-26 (×18): qty 1

## 2023-11-26 MED ORDER — AMLODIPINE BESYLATE 5 MG PO TABS
5.0000 mg | ORAL_TABLET | Freq: Every day | ORAL | Status: DC
  Administered 2023-11-26 – 2023-12-11 (×15): 5 mg via ORAL
  Filled 2023-11-26 (×20): qty 1

## 2023-11-26 MED ORDER — BOOST / RESOURCE BREEZE PO LIQD CUSTOM
1.0000 | Freq: Three times a day (TID) | ORAL | Status: DC
  Administered 2023-11-26 – 2023-12-11 (×34): 1 via ORAL

## 2023-11-26 MED ORDER — CARVEDILOL 6.25 MG PO TABS
3.1250 mg | ORAL_TABLET | Freq: Two times a day (BID) | ORAL | Status: DC
  Administered 2023-11-26 – 2023-12-11 (×28): 3.125 mg via ORAL
  Filled 2023-11-26 (×32): qty 1

## 2023-11-26 NOTE — Plan of Care (Signed)

## 2023-11-26 NOTE — Plan of Care (Signed)
  Problem: Activity: Goal: Risk for activity intolerance will decrease Outcome: Progressing   Problem: Coping: Goal: Level of anxiety will decrease Outcome: Progressing   Problem: Pain Managment: Goal: General experience of comfort will improve and/or be controlled Outcome: Progressing   Problem: Safety: Goal: Ability to remain free from injury will improve Outcome: Progressing
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
## 2023-11-26 NOTE — Consult Note (Signed)
 Inpatient Consultation   Patient ID: Daniel Kline is a 78 y.o. male.  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  He is accompanied by his wife at bedside who helps provide history  Patient has noted for the last 3 days having black stools.  He had some weakness and diminished appetite.  Denies hematochezia.  No recent NSAID or prednisone  use.  No hematemesis or coffee-ground emesis.  Multiple sites of bruising.  Upon presentation his hemoglobin is noted to be 6.6.  He was pancytopenic including a platelet count less than 5000 and white count at 0.3.  He did receive a unit of PRBCs and 2 packs of platelets improving his counts to 7.1 and 53,000 respectively. Noted to have AKI on presentation with a creatinine of 3.88 BUN 83.  This is slightly improved today.  CT was negative for active extravasation  He denies any abdominal pain currently.  Bowel movement yesterday evening was still dark.  No other acute GI complaints  Denies NSAIDs, Anti-plt agents, and anticoagulants Denies family history of gastrointestinal disease and malignancy Previous Endoscopies: Colonoscopy in August 2020 -3 tubular adenomas, diverticulosis and internal hemorrhoids February 2010- diverticulosis and internal hemorrhoids  No h/o egd   Past Medical History:  Diagnosis Date   Anemia    H/O   Anxiety    Arthritis    Atrial flutter (HCC)    a. s/p post ablation in 04/2017   Chronic kidney disease    Complication of anesthesia    CTCL (cutaneous T-cell lymphoma) (HCC)    Diabetes mellitus without complication (HCC)     Diastolic dysfunction    a. 03/2022 Echo: EF 60-65%, no rwma, GrI DD, nl RV fxn.   Dysplastic nevus 12/19/2017   Right distal lat. forearm near wrist. Severe atypia, close to peripheral margin.   Dysplastic nevus 06/21/2018   Upper back right paraspinal. Severe atypia, peripheral margin involved. Excised 07/11/2018, margins free.   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 forearm. SCCis, hypertrophic.    Past Surgical History:  Procedure Laterality Date   BACK SURGERY     LUMBAR   CARDIAC ELECTROPHYSIOLOGY STUDY AND ABLATION  2019   CATARACT EXTRACTION W/PHACO Right 05/05/2022   Procedure: CATARACT EXTRACTION PHACO AND INTRAOCULAR LENS PLACEMENT (IOC) RIGHT;  Surgeon: Annell Kidney, MD;  Location: [facility];  Service: Ophthalmology;  Laterality: Right;  Diabetic 10.42 01:13.4   COLONOSCOPY WITH PROPOFOL  N/A 04/04/2019   Procedure: COLONOSCOPY WITH PROPOFOL ;  Surgeon: Toledo, Alphonsus Jeans, MD;  Location: [facility];  Service: Gastroenterology;  Laterality: N/A;   I & D EXTREMITY Right 02/01/2020   Procedure: IRRIGATION AND DEBRIDEMENT EXTREMITY with poly exchange;  Surgeon: Arlyne Lame, MD;  Location: [facility];  Service: Orthopedics;  Laterality: Right;   INCISION AND DRAINAGE     BACK-MRSA INFECTION AFTER BACK SURGERY   KNEE ARTHROPLASTY Right 01/28/2020   Procedure: COMPUTER ASSISTED TOTAL KNEE ARTHROPLASTY;  Surgeon: Arlyne Lame, MD;  Location: [facility];  Service: Orthopedics;  Laterality:  Right;   MOUTH SURGERY     right elbow surgery     right knee surgery     right shoulder surgery     from polio damage   TONSILLECTOMY     TOTAL HIP ARTHROPLASTY Bilateral 04/2016    Allergies  Allergen Reactions   Bee Venom Anaphylaxis   Oxycodone  Other (See Comments)    Delusions   Hydromorphone  Other (See Comments)    hallucinating   Hydroxychloroquine     Other reaction(s): Other (See Comments) He broke out really badly.   Zolpidem Other (See Comments)    Family History  Adopted: Yes    Social History   Tobacco Use   Smoking status: Former    Types: Cigars    Quit date: 09/23/1990    Years since quitting: 33.1    Passive exposure: Past (as a child)   Smokeless tobacco: Former    Quit date: 02/25/2006  Vaping Use   Vaping status: Never Used  Substance Use Topics   Alcohol use: No    Alcohol/week: 0.0 standard drinks of alcohol   Drug use: No     Pertinent GI related history and allergies were reviewed with the patient  Review of Systems  Constitutional:  Positive for appetite change. Negative for activity change, chills, diaphoresis, fatigue, fever and unexpected weight change.  HENT:  Negative for trouble swallowing and voice change.   Respiratory:  Negative for shortness of breath and wheezing.   Cardiovascular:  Negative for chest pain, palpitations and leg swelling.  Gastrointestinal:  Positive for blood in stool (melena). Negative for abdominal distention, abdominal pain, anal bleeding, constipation, diarrhea, nausea, rectal pain and vomiting.  Musculoskeletal:  Negative for arthralgias and myalgias.  Skin:  Negative for color change and pallor.  Neurological:  Positive for weakness. Negative for dizziness and syncope.  Psychiatric/Behavioral:  Negative for confusion. The patient is not nervous/anxious.   All other systems reviewed and are negative.    Medications Home Medications No current facility-administered medications on file prior to  encounter.   Current Outpatient Medications on File Prior to Encounter  Medication Sig Dispense Refill   amLODipine  (NORVASC ) 5 MG tablet Take 1 tablet (5 mg total) by mouth daily. 90 tablet 3   calcitRIOL (ROCALTROL) 0.25 MCG capsule Take 0.25 mcg by mouth daily.     carvedilol  (COREG ) 3.125 MG tablet TAKE 1 TABLET BY MOUTH 2 TIMES DAILY WITH A MEAL. 180 tablet 3   clorazepate  (TRANXENE ) 7.5 MG tablet TAKE 1 TABLET BY MOUTH TWICE A DAY AS NEEDED FOR ANXIETY 60 tablet 0   dapagliflozin propanediol (FARXIGA) 5 MG TABS tablet Take 5 mg by mouth daily.     folic acid  (FOLVITE ) 1 MG tablet Take 1 mg by mouth daily. Every day except day that he takes methotrexate      gabapentin  (NEURONTIN ) 300 MG capsule TAKE 1 CAPSULE BY MOUTH 3 TIMES  DAILY 300 capsule 3   glipiZIDE  (GLUCOTROL  XL) 5 MG 24 hr tablet Take 5 mg by mouth daily with  breakfast.     HYDROcodone -acetaminophen  (NORCO/VICODIN) 5-325 MG tablet TAKE 1 TABLET BY MOUTH EVERY 6 HOURS AS NEEDED FOR PAIN 60 tablet 0   hydrOXYzine  (ATARAX ) 25 MG tablet TAKE ONE TABLET EVERY EIGHT HOURS AS NEEDED 90 tablet 3   isosorbide  mononitrate (IMDUR ) 60 MG 24 hr tablet Take 1 tablet (60 mg total) by mouth daily before lunch. 90 tablet 3   latanoprost  (XALATAN ) 0.005 % ophthalmic solution Place 1 drop into both eyes at bedtime.     losartan  (COZAAR ) 100 MG tablet TAKE 1 TABLET BY MOUTH DAILY 90 tablet 3   methotrexate  (RHEUMATREX) 2.5 MG tablet Take 5 mg by mouth once a week.     rosuvastatin  (CRESTOR ) 10 MG tablet Take 1 tablet (10 mg total) by mouth daily. 90 tablet 3   spironolactone  (ALDACTONE ) 25 MG tablet Take 25 mg by mouth daily.     calcium  carbonate (OSCAL) 1500 (600 Ca) MG TABS tablet Take 600 mg of elemental calcium  by mouth daily with breakfast. (Patient not taking: Reported on 11/25/2023)     Cholecalciferol  (VITAMIN D -3) 25 MCG (1000 UT) CAPS Take 1,000 Units by mouth daily. (Patient not taking: Reported on 11/25/2023)     cyanocobalamin (VITAMIN  B12) 500 MCG tablet Take 1,000 mcg by mouth daily. (Patient not taking: Reported on 11/25/2023)     EPINEPHrine  0.3 mg/0.3 mL IJ SOAJ injection Inject 0.3 mg into the muscle as needed for anaphylaxis. (Patient not taking: Reported on 11/25/2023) 1 each 5   Ferrous Sulfate  (IRON PO) Take 1 tablet by mouth at bedtime. (Patient not taking: Reported on 11/25/2023)     furosemide  (LASIX ) 40 MG tablet Take 1 tablet (40 mg total) by mouth daily. (Patient not taking: Reported on 11/25/2023) 30 tablet 0   glucose blood (ONETOUCH VERIO) test strip Use to check blood sugar once daily 100 each 12   hydrocortisone  2.5 % cream Apply topically 3 (three) times daily as needed. (Patient not taking: Reported on 11/25/2023) 56 g 3   MAGNESIUM  PO Take 1 tablet by mouth daily. (Patient not taking: Reported on 11/25/2023)     Pertinent GI related medications were reviewed with the patient  Inpatient Medications  Current Facility-Administered Medications:    0.9 %  sodium chloride  infusion, 10 mL/hr, Intravenous, Once, Cox, Amy N, DO   acetaminophen  (TYLENOL ) tablet 650 mg, 650 mg, Oral, Q6H PRN **OR** acetaminophen  (TYLENOL ) suppository 650 mg, 650 mg, Rectal, Q6H PRN, Cox, Amy N, DO   dextrose  50 % solution 50 mL, 1 ampule, Intravenous, PRN, Cox, Amy N, DO   feeding supplement (BOOST / RESOURCE BREEZE) liquid 1 Container, 1 Container, Oral, TID BM, Cox, Amy N, DO   filgrastim -aafi (NIVESTYM ) injection 300 mcg, 300 mcg, Subcutaneous, q1800, Cox, Amy N, DO, 300 mcg at 11/25/23 2234   folic acid  (FOLVITE ) tablet 1 mg, 1 mg, Oral, Daily, Cox, Amy N, DO, 1 mg at 11/25/23 2145   hydrALAZINE  (APRESOLINE ) injection 5 mg, 5 mg, Intravenous, Q6H PRN, Cox, Amy N, DO   ondansetron  (ZOFRAN ) tablet 4 mg, 4 mg, Oral, Q6H PRN **OR** ondansetron  (ZOFRAN ) injection 4 mg, 4 mg, Intravenous, Q6H PRN, Cox, Amy N, DO   [COMPLETED] pantoprazole  (PROTONIX ) injection 40 mg, 40 mg, Intravenous, Q5 min, 40 mg at 11/25/23 1438 **FOLLOWED BY**  pantoprazole  (PROTONIX ) injection 40 mg, 40 mg, Intravenous, Q12H, Cox, Amy N, DO, 40 mg at 11/26/23 0233   senna-docusate (Senokot-S) tablet 1 tablet, 1 tablet, Oral, QHS PRN, Cox, Amy N, DO  sodium chloride       acetaminophen  **OR** acetaminophen , dextrose , hydrALAZINE , ondansetron  **OR** ondansetron  (ZOFRAN ) IV, senna-docusate   Objective   Vitals:   11/25/23 2357 11/26/23 0017 11/26/23 0155 11/26/23 0413  BP: (!) 136/40 (!) 133/41 (!) 132/50 (!) 134/51  Pulse: 73 78 85 85  Resp: 19 19 18    Temp: 98.2 F (36.8 C) 98 F (36.7 C) 98.1 F (36.7 C) 98.2 F (36.8 C)  TempSrc: Oral Oral Oral Oral  SpO2: 100% 100% 100% 100%  Weight:      Height:         Physical Exam Vitals and nursing note reviewed.  Constitutional:      General: He is not in acute distress.    Appearance: He is obese. He is ill-appearing. He is not toxic-appearing or diaphoretic.  HENT:     Head: Normocephalic and atraumatic.     Nose: Nose normal.     Mouth/Throat:     Mouth: Mucous membranes are moist.     Pharynx: Oropharynx is clear.  Eyes:     General: No scleral icterus.    Extraocular Movements: Extraocular movements intact.  Cardiovascular:     Rate and Rhythm: Normal rate and regular rhythm.     Heart sounds: Normal heart sounds. No murmur heard.    No friction rub. No gallop.  Pulmonary:     Effort: Pulmonary effort is normal. No respiratory distress.     Breath sounds: Normal breath sounds. No wheezing, rhonchi or rales.  Abdominal:     General: Bowel sounds are normal. There is no distension.     Palpations: Abdomen is soft.     Tenderness: There is no abdominal tenderness. There is no guarding or rebound.  Musculoskeletal:     Cervical back: Neck supple.     Right lower leg: Edema present.     Left lower leg: Edema present.  Skin:    General: Skin is warm and dry.     Coloration: Skin is pale. Skin is not jaundiced.     Findings: Bruising present.     Comments: Diffuse scattered  ecchymosis  Neurological:     General: No focal deficit present.     Mental Status: He is alert and oriented to person, place, and time. Mental status is at baseline.  Psychiatric:        Mood and Affect: Mood normal.        Behavior: Behavior normal.        Thought Content: Thought content normal.        Judgment: Judgment normal.     Laboratory Data Recent Labs  Lab 11/25/23 1352 11/26/23 0359  WBC 0.3* 0.5*  HGB 6.6* 7.1*  HCT 18.9* 19.7*  PLT <5* 53*   Recent Labs  Lab 11/25/23 1352 11/26/23 0359  NA 127* 131*  K 4.0 4.2  CL 103 106  CO2 16* 17*  BUN 83* 75*  CALCIUM  8.4* 8.3*  PROT 5.8* 5.3*  BILITOT 0.7 1.1  ALKPHOS 33* 33*  ALT 45* 44  AST 38 35  GLUCOSE 105* 88   Recent Labs  Lab 11/25/23 1352  INR 1.2    No results for input(s): "LIPASE" in the last 72 hours.      Imaging Studies: CT ABDOMEN PELVIS WO CONTRAST Result Date: 11/25/2023 CLINICAL DATA:  Acute nonlocalized abdominal pain. Dark stool and dark emesis. EXAM: CT ABDOMEN AND PELVIS WITHOUT CONTRAST TECHNIQUE: Multidetector CT imaging of the abdomen and pelvis was performed following  the standard protocol without IV contrast. RADIATION DOSE REDUCTION: This exam was performed according to the departmental dose-optimization program which includes automated exposure control, adjustment of the mA and/or kV according to patient size and/or use of iterative reconstruction technique. COMPARISON:  07/28/2023 FINDINGS: Lower chest: No acute abnormality. Hepatobiliary: Respiratory motion obscures detail in the upper abdomen. Unremarkable noncontrast appearance of the liver. Large gallstones in the decompressed gallbladder. No evidence of acute cholecystitis. No biliary dilation. Pancreas: Unremarkable. Spleen: Unremarkable. Adrenals/Urinary Tract: Normal adrenal glands. No hydronephrosis. Nonobstructing stone in the left kidney. Unchanged 3.0 cm hyperdense right renal lesion. The bladder is unremarkable where  not obscured by streak artifact. Stomach/Bowel: No bowel obstruction or bowel wall thickening. Colonic diverticulosis without diverticulitis. Small hiatal hernia. Normal appendix. Vascular/Lymphatic: Aortic atherosclerosis. No enlarged abdominal or pelvic lymph nodes. Reproductive: Obscured by streak artifact. Other: Fat containing left inguinal and umbilical hernias. No free intraperitoneal fluid or air. Musculoskeletal: Bilateral THAs. Posterior fusion L2-L4. No acute fracture. IMPRESSION: 1. No acute abnormality in the abdomen or pelvis. 2. Cholelithiasis without evidence of acute cholecystitis. 3. Nonobstructing left renal stone. 4. Unchanged 3.0 cm hyperdense right renal lesion, likely a hemorrhagic or proteinaceous cyst however solid neoplasm is not excluded on noncontrast exam. Nonemergent renal protocol MRI with and without IV contrast is recommended. 5. Aortic Atherosclerosis (ICD10-I70.0). Electronically Signed   By: Rozell Cornet M.D.   On: 11/25/2023 17:11   CT HEAD WO CONTRAST ( ) Result Date: 11/25/2023 CLINICAL DATA:  Head trauma, minor.  Headache. EXAM: CT HEAD WITHOUT CONTRAST TECHNIQUE: Contiguous axial images were obtained from the base of the skull through the vertex without intravenous contrast. RADIATION DOSE REDUCTION: This exam was performed according to the departmental dose-optimization program which includes automated exposure control, adjustment of the mA and/or kV according to patient size and/or use of iterative reconstruction technique. COMPARISON:  Head CT 09/05/2022. FINDINGS: Brain: No acute hemorrhage. 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 Gray-white differentiation is otherwise preserved. No hydrocephalus or extra-axial collection. No mass effect or midline shift. Vascular: No hyperdense vessel. Atherosclerotic calcifications of the carotid siphons and intracranial portions of the vertebral arteries. Skull: No calvarial  fracture or suspicious bone lesion. Skull base is unremarkable. 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 Orbits are unremarkable. Other: None. IMPRESSION: 1. No evidence of acute intracranial injury. 2. 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 3. 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 Electronically Signed   By: Audra Blend M.D.   On: 11/25/2023 15:29    Assessment:   # Anemia - suspect multifactorial - combination of methotrexate  bone marrow suppression in setting of AKI leading to low blood counts - with his plt count <5K he is at risk for bleeding spontaneously from any site, melena is likely from oozing within the GI tract - ckd also contributory - s/p 1 u prbc and 2 packs of platelets - diffuse bruising may also be contributory  # suspected ugib- likely 2/2 oozing from severely depressed platelet countes  # Pancytopenia in setting of methotrexate  use # neutropenia - 0.1 on 11/26/23 # AKI on CKD  # gerd # RA # dm2, hld, htn, obesity  Plan:  OK to increase diet from clear liquids Tentative plan for EGD Monday with neutrophil and platelet count improvement . Will make npo Sunday into Monday  Protonix  40 mg iv q12 h Hold dvt ppx Hematology has seen and started in filgrastim   Neprhology following  Monitor H&H.  Transfusion and resuscitation as per primary team Avoid frequent lab draws to prevent lab induced anemia Supportive care and antiemetics as per primary team Maintain two sites IV access Avoid nsaids Monitor for GIB.  Will continue to follow  Management of other medical comorbidities as per primary team  I personally performed the service.  Thank you for allowing us  to participate in this patient's care. Please don't hesitate to call if any questions or concerns arise.   Quintin Buckle, DO [facility]  Portions of the record may have been created with voice recognition  software. Occasional wrong-word or 'sound-a-like' substitutions may have occurred due to the inherent limitations of voice recognition software.  Read the chart carefully and recognize, using context, where substitutions may have occurred.

## 2023-11-26 NOTE — Progress Notes (Signed)
 Progress Note   Patient: Daniel Kline:[id number] DOB: [birth year] DOA: 11/25/2023     1 DOS: the patient was seen and examined on 11/26/2023   Brief hospital course: Daniel Kline is a 78 year old male with history of T-cell lymphoma, rheumatoid arthritis on methotrexate , CKD stage IV, hypertension, neuropathy, non-insulin -dependent diabetes mellitus, hypertension, hyperlipidemia, who presents emergency department for chief concerns of melena stool and falling at home.  Patient is admitted to the hospitalist service for further management evaluation upper GI bleed, pancytopenia, acute kidney injury.  Assessment and Plan: * Upper GI bleed Acute blood loss anemia GI evaluation appreciated. EGD tentatively planned for Monday. Continue with Protonix  40 mg IV twice daily 1 unit of PRBC, 2 units of platelets transfused. Goal hgb > 8, goal platelet > 20 at this time Liquid diet changed to regular with NPO past midnight for EGD Monday.  Acute on CKD stage 4 Etiology is likely multifactorial including secondary to severe anemia and poor p.o. intake Hold Losartan . Nephrology consulted. Continue to monitor daily renal function.  Type 2 diabetes mellitus with chronic kidney disease, without long-term current use of insulin  (HCC) Hold glipizide . A1C 6.2. Will hold off on checking blood sugars for now, eating poor.  Morbid obesity (HCC) This complicates overall care and prognosis.  Diet, exercise and weight reduction advised.  Essential hypertension Home Norvasc , Coreg  resumed. Hydralazine  IV as needed ordered.  GERD (gastroesophageal reflux disease) Protonix  40 mg IV twice daily  Eschar of lower leg S/p accidental fall. Continue pain control.  Pancytopenia (HCC) Filgrastim  300 mcg daily until ANC is greater than 1.5 per hematology recommendation.  Anti-cyclic citrullinated peptide antibody positive Methotrexate  on hold due to pancytopenia.  Cutaneous T-cell lymphoma  (HCC) Continue outpatient follow-up with hematologist as appropriate     Out of bed to chair. Incentive spirometry. Nursing supportive care. Fall, aspiration precautions. Diet:  Diet Orders (From admission, onward)     Start     Ordered   11/26/23 1355  DIET SOFT Fluid consistency: Thin  Diet effective now       Question:  Fluid consistency:  Answer:  Thin   11/26/23 1354           DVT prophylaxis: Place TED hose Start: 11/25/23 1753  Level of care: Progressive   Code Status: Full Code  Subjective: Patient is seen and examined today morning. He is weak. Denies any complaints. Eating poor. Did not get out of bed. Wife at bedside.   Physical Exam: Vitals:   11/26/23 0155 11/26/23 0413 11/26/23 0815 11/26/23 1125  BP: (!) 132/50 (!) 134/51 (!) 141/53 (!) 168/73  Pulse: 85 85 68 80  Resp: 18   20  Temp: 98.1 F (36.7 C) 98.2 F (36.8 C) 98.3 F (36.8 C) 99.6 F (37.6 C)  TempSrc: Oral Oral  Oral  SpO2: 100% 100% 99% 99%  Weight:      Height:        General - Elderly ill Caucasian male, no apparent distress HEENT - PERRLA, EOMI, atraumatic head, non tender sinuses. Lung - Clear, basal rales, no rhonchi, wheezes. Heart - S1, S2 heard, no murmurs, rubs, trace pedal edema. Abdomen - Soft, non tender, bowel sounds good Neuro - Alert, awake and oriented x 3, non focal exam. Skin - Warm and dry.  Data Reviewed:      Latest Ref Rng & Units 11/26/2023    3:59 AM 11/25/2023    1:52 PM 10/26/2023   12:09 PM  CBC  WBC 4.0 - 10.5 K/uL 0.5  0.3  3.5   Hemoglobin 13.0 - 17.0 g/dL 7.1  6.6  16.1   Hematocrit 39.0 - 52.0 % 19.7  18.9  31.6   Platelets 150 - 400 K/uL 53  <5  106       Latest Ref Rng & Units 11/26/2023    3:59 AM 11/25/2023    1:52 PM 10/26/2023   12:09 PM  BMP  Glucose 70 - 99 mg/dL 88  096  045   BUN 8 - 23 mg/dL 75  83  53   Creatinine 0.61 - 1.24 mg/dL 4.09  8.11  9.14   Sodium 135 - 145 mmol/L 131  127  135   Potassium 3.5 - 5.1 mmol/L 4.2  4.0   4.2   Chloride 98 - 111 mmol/L 106  103  107   CO2 22 - 32 mmol/L 17  16  22    Calcium  8.9 - 10.3 mg/dL 8.3  8.4  8.7    CT ABDOMEN PELVIS WO CONTRAST Result Date: 11/25/2023 CLINICAL DATA:  Acute nonlocalized abdominal pain. Dark stool and dark emesis. EXAM: CT ABDOMEN AND PELVIS WITHOUT CONTRAST TECHNIQUE: Multidetector CT imaging of the abdomen and pelvis was performed following the standard protocol without IV contrast. RADIATION DOSE REDUCTION: This exam was performed according to the departmental dose-optimization program which includes automated exposure control, adjustment of the mA and/or kV according to patient size and/or use of iterative reconstruction technique. COMPARISON:  07/28/2023 FINDINGS: Lower chest: No acute abnormality. Hepatobiliary: Respiratory motion obscures detail in the upper abdomen. Unremarkable noncontrast appearance of the liver. Large gallstones in the decompressed gallbladder. No evidence of acute cholecystitis. No biliary dilation. Pancreas: Unremarkable. Spleen: Unremarkable. Adrenals/Urinary Tract: Normal adrenal glands. No hydronephrosis. Nonobstructing stone in the left kidney. Unchanged 3.0 cm hyperdense right renal lesion. The bladder is unremarkable where not obscured by streak artifact. Stomach/Bowel: No bowel obstruction or bowel wall thickening. Colonic diverticulosis without diverticulitis. Small hiatal hernia. Normal appendix. Vascular/Lymphatic: Aortic atherosclerosis. No enlarged abdominal or pelvic lymph nodes. Reproductive: Obscured by streak artifact. Other: Fat containing left inguinal and umbilical hernias. No free intraperitoneal fluid or air. Musculoskeletal: Bilateral THAs. Posterior fusion L2-L4. No acute fracture. IMPRESSION: 1. No acute abnormality in the abdomen or pelvis. 2. Cholelithiasis without evidence of acute cholecystitis. 3. Nonobstructing left renal stone. 4. Unchanged 3.0 cm hyperdense right renal lesion, likely a hemorrhagic or  proteinaceous cyst however solid neoplasm is not excluded on noncontrast exam. Nonemergent renal protocol MRI with and without IV contrast is recommended. 5. Aortic Atherosclerosis (ICD10-I70.0). Electronically Signed   By: Rozell Cornet M.D.   On: 11/25/2023 17:11   CT HEAD WO CONTRAST ( ) Result Date: 11/25/2023 CLINICAL DATA:  Head trauma, minor.  Headache. EXAM: CT HEAD WITHOUT CONTRAST TECHNIQUE: Contiguous axial images were obtained from the base of the skull through the vertex without intravenous contrast. RADIATION DOSE REDUCTION: This exam was performed according to the departmental dose-optimization program which includes automated exposure control, adjustment of the mA and/or kV according to patient size and/or use of iterative reconstruction technique. COMPARISON:  Head CT 09/05/2022. FINDINGS: Brain: No acute hemorrhage. 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 Gray-white differentiation is otherwise preserved. No hydrocephalus or extra-axial collection. No mass effect or midline shift. Vascular: No hyperdense vessel. Atherosclerotic calcifications of the carotid siphons and intracranial portions of the vertebral arteries. Skull: No calvarial fracture or suspicious bone lesion. Skull base is unremarkable. Sinuses/Orbits:  Moderate to severe pansinus disease. Orbits are unremarkable. Other: None. IMPRESSION: 1. No evidence of acute intracranial injury. 2. 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 3. 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 Electronically Signed   By: Audra Blend M.D.   On: 11/25/2023 15:29    Family Communication: Discussed with patient, wife. They  understand and agree. All questions answered.  Disposition: Status is: Inpatient Remains inpatient appropriate because: severe anemia, pancytopenia  Planned Discharge Destination: Home with Home Health     Time spent: 40  minutes  Author: Aisha Hove, MD 11/26/2023 2:00 PM Secure chat 7am to 7pm For on call review [email or web address].

## 2023-11-27 DIAGNOSIS — N179 Acute kidney failure, unspecified: Secondary | ICD-10-CM | POA: Diagnosis not present

## 2023-11-27 DIAGNOSIS — D61818 Other pancytopenia: Secondary | ICD-10-CM | POA: Diagnosis not present

## 2023-11-27 DIAGNOSIS — K922 Gastrointestinal hemorrhage, unspecified: Secondary | ICD-10-CM | POA: Diagnosis not present

## 2023-11-27 DIAGNOSIS — E871 Hypo-osmolality and hyponatremia: Secondary | ICD-10-CM

## 2023-11-27 LAB — CBC WITH DIFFERENTIAL/PLATELET
Abs Immature Granulocytes: 0.06 10*3/uL (ref 0.00–0.07)
Band Neutrophils: 0 %
Basophils Absolute: 0 10*3/uL (ref 0.0–0.1)
Basophils Relative: 1 %
Blasts: 0 %
Eosinophils Absolute: 0.1 10*3/uL (ref 0.0–0.5)
Eosinophils Relative: 5 %
HCT: 21.1 % — ABNORMAL LOW (ref 39.0–52.0)
Hemoglobin: 7.4 g/dL — ABNORMAL LOW (ref 13.0–17.0)
Immature Granulocytes: 4 %
Lymphocytes Relative: 41 %
Lymphs Abs: 0.6 10*3/uL — ABNORMAL LOW (ref 0.7–4.0)
MCH: 31.4 pg (ref 26.0–34.0)
MCHC: 35.1 g/dL (ref 30.0–36.0)
MCV: 89.4 fL (ref 80.0–100.0)
Metamyelocytes Relative: 0 %
Monocytes Absolute: 0.5 10*3/uL (ref 0.1–1.0)
Monocytes Relative: 39 %
Myelocytes: 0 %
Neutro Abs: 0.1 10*3/uL — CL (ref 1.7–7.7)
Neutrophils Relative %: 10 %
Other: 0 %
Platelets: 43 10*3/uL — ABNORMAL LOW (ref 150–400)
Promyelocytes Relative: 0 %
RBC: 2.36 MIL/uL — ABNORMAL LOW (ref 4.22–5.81)
RDW: 16 % — ABNORMAL HIGH (ref 11.5–15.5)
Smear Review: DECREASED
WBC: 1.4 10*3/uL — CL (ref 4.0–10.5)
nRBC: 0 /100{WBCs}
nRBC: 2.2 % — ABNORMAL HIGH (ref 0.0–0.2)

## 2023-11-27 NOTE — Progress Notes (Signed)
 [facility]  ROUNDING NOTE   Subjective:   [name] was admitted to [facility] on 11/25/2023 for Upper GI bleed [K92.2] Pancytopenia (HCC) [D61.818] Gastrointestinal hemorrhage, unspecified gastrointestinal hemorrhage type [K92.2]  Patient alert but is more confused today No family at bedside  Creatinine 3.55 BUN 75   Objective:  Vital signs in last 24 hours:  Temp:  [98.1 F (36.7 C)-99 F (37.2 C)] 98.1 F (36.7 C) (04/20 0736) Pulse Rate:  [71-100] 100 (04/20 0736) Resp:  [16-22] 20 (04/20 0623) BP: (119-166)/(63-82) 125/79 (04/20 0736) SpO2:  [97 %-99 %] 99 % (04/20 0736)  Weight change:  Filed Weights   11/25/23 2143  Weight: 103.2 kg    Intake/Output: I/O last 3 completed shifts: In: 1939.4 [P.O.:1017; Blood:922.4] Out: 760 [Urine:760]   Intake/Output this shift:  No intake/output data recorded.  Physical Exam: General: NAD, laying in bed  Head: Normocephalic, atraumatic. Moist oral mucosal membranes  Eyes: Anicteric  Lungs:  Clear to auscultation  Heart: Regular rate and rhythm  Abdomen:  Soft, nontender,   Extremities:  no peripheral edema.  Neurologic: Alert and oriented to self, moving all four extremities  Skin: +echymosis  Access: none    Basic Metabolic Panel: Recent Labs  Lab 11/25/23 1352 11/26/23 0359  NA 127* 131*  K 4.0 4.2  CL 103 106  CO2 16* 17*  GLUCOSE 105* 88  BUN 83* 75*  CREATININE 3.88* 3.55*  CALCIUM  8.4* 8.3*    Liver Function Tests: Recent Labs  Lab 11/25/23 1352 11/26/23 0359  AST 38 35  ALT 45* 44  ALKPHOS 33* 33*  BILITOT 0.7 1.1  PROT 5.8* 5.3*  ALBUMIN 3.1* 2.9*   No results for input(s): "LIPASE", "AMYLASE" in the last 168 hours. No results for input(s): "AMMONIA" in the last 168 hours.  CBC: Recent Labs  Lab 11/25/23 1352 11/26/23 0359 11/27/23 0547  WBC 0.3* 0.5* 1.4*  NEUTROABS  --  0.1* 0.1*  HGB 6.6* 7.1* 7.4*  HCT 18.9* 19.7* 21.1*  MCV 95.9 90.4 89.4  PLT <5*  53* 43*    Cardiac Enzymes: Recent Labs  Lab 11/26/23 0359  CKTOTAL 443*    BNP: Invalid input(s): "POCBNP"  CBG: No results for input(s): "GLUCAP" in the last 168 hours.  Microbiology: Results for orders placed or performed in visit on 10/04/23  Microscopic Examination     Status: Abnormal   Collection Time: 10/04/23  3:26 PM   Urine  Result Value Ref Range Status   WBC, UA 0-5 0 - 5 /hpf Final   RBC, Urine 0-2 0 - 2 /hpf Final   Epithelial Cells (non renal) 0-10 0 - 10 /hpf Final   Casts Present (A) None seen /lpf Final   Cast Type Hyaline casts N/A Final   Mucus, UA Present (A) Not Estab. Final   Bacteria, UA Few None seen/Few Final    Coagulation Studies: Recent Labs    11/25/23 1352  LABPROT 15.0  INR 1.2    Urinalysis: No results for input(s): "COLORURINE", "LABSPEC", "PHURINE", "GLUCOSEU", "HGBUR", "BILIRUBINUR", "KETONESUR", "PROTEINUR", "UROBILINOGEN", "NITRITE", "LEUKOCYTESUR" in the last 72 hours.  Invalid input(s): "APPERANCEUR"    Imaging: CT ABDOMEN PELVIS WO CONTRAST Result Date: 11/25/2023 CLINICAL DATA:  Acute nonlocalized abdominal pain. Dark stool and dark emesis. EXAM: CT ABDOMEN AND PELVIS WITHOUT CONTRAST TECHNIQUE: Multidetector CT imaging of the abdomen and pelvis was performed following the standard protocol without IV contrast. RADIATION DOSE REDUCTION: This exam was performed according to the  departmental dose-optimization program which includes automated exposure control, adjustment of the mA and/or kV according to patient size and/or use of iterative reconstruction technique. COMPARISON:  07/28/2023 FINDINGS: Lower chest: No acute abnormality. Hepatobiliary: Respiratory motion obscures detail in the upper abdomen. Unremarkable noncontrast appearance of the liver. Large gallstones in the decompressed gallbladder. No evidence of acute cholecystitis. No biliary dilation. Pancreas: Unremarkable. Spleen: Unremarkable. Adrenals/Urinary Tract:  Normal adrenal glands. No hydronephrosis. Nonobstructing stone in the left kidney. Unchanged 3.0 cm hyperdense right renal lesion. The bladder is unremarkable where not obscured by streak artifact. Stomach/Bowel: No bowel obstruction or bowel wall thickening. Colonic diverticulosis without diverticulitis. Small hiatal hernia. Normal appendix. Vascular/Lymphatic: Aortic atherosclerosis. No enlarged abdominal or pelvic lymph nodes. Reproductive: Obscured by streak artifact. Other: Fat containing left inguinal and umbilical hernias. No free intraperitoneal fluid or air. Musculoskeletal: Bilateral THAs. Posterior fusion L2-L4. No acute fracture. IMPRESSION: 1. No acute abnormality in the abdomen or pelvis. 2. Cholelithiasis without evidence of acute cholecystitis. 3. Nonobstructing left renal stone. 4. Unchanged 3.0 cm hyperdense right renal lesion, likely a hemorrhagic or proteinaceous cyst however solid neoplasm is not excluded on noncontrast exam. Nonemergent renal protocol MRI with and without IV contrast is recommended. 5. Aortic Atherosclerosis (ICD10-I70.0). Electronically Signed   By: Rozell Cornet M.D.   On: 11/25/2023 17:11   CT HEAD WO CONTRAST ( ) Result Date: 11/25/2023 CLINICAL DATA:  Head trauma, minor.  Headache. EXAM: CT HEAD WITHOUT CONTRAST TECHNIQUE: Contiguous axial images were obtained from the base of the skull through the vertex without intravenous contrast. RADIATION DOSE REDUCTION: This exam was performed according to the departmental dose-optimization program which includes automated exposure control, adjustment of the mA and/or kV according to patient size and/or use of iterative reconstruction technique. COMPARISON:  Head CT 09/05/2022. FINDINGS: Brain: No acute hemorrhage. 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 Gray-white differentiation is otherwise preserved. No hydrocephalus or extra-axial collection. No mass effect or midline  shift. Vascular: No hyperdense vessel. Atherosclerotic calcifications of the carotid siphons and intracranial portions of the vertebral arteries. Skull: No calvarial fracture or suspicious bone lesion. Skull base is unremarkable. 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 Orbits are unremarkable. Other: None. IMPRESSION: 1. No evidence of acute intracranial injury. 2. 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 3. 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 Electronically Signed   By: Audra Blend M.D.   On: 11/25/2023 15:29     Medications:    sodium chloride       amLODipine   5 mg Oral Daily   carvedilol   3.125 mg Oral BID WC   feeding supplement  1 Container Oral TID BM   filgrastim  (NIVESTYM ) SQ  300 mcg Subcutaneous q1800   folic acid   1 mg Oral Daily   pantoprazole  (PROTONIX ) IV  40 mg Intravenous Q12H   rosuvastatin   10 mg Oral Daily   acetaminophen  **OR** acetaminophen , dextrose , hydrALAZINE , ondansetron  **OR** ondansetron  (ZOFRAN ) IV, senna-docusate  Assessment/ Plan:  [name] is a 78 y.o.  male with diabetes mellitus type II, hypertension, glaucoma, hyperlipidemia, T cell lymphoma, and rheumatoid arthritis who is admitted to [facility] on 11/25/2023 for Upper GI bleed [K92.2] Pancytopenia (HCC) [Z61.096] Gastrointestinal hemorrhage, unspecified gastrointestinal hemorrhage type [K92.2]  Acute Kidney Injury on Chronic Kidney Disease stage IV: baseline creatinine of 3.8, GFR of 15 on 11/15/23.  Creatinine 2.6 on 09/23/2023. - holding losartan , dapagliflozin, and spironolactone .  - Creatinine slightly improved today.  BUN remains elevated.   - No acute indication for dialysis.   - Will continue to monitor renal indices.  Hypertension with chronic kidney disease: 132/50 - continue amlodipine  and carvedilol  - Blood pressure stable for this patient.  Diabetes mellitus type II with chronic kidney disease:  - holding  glipizide  - continue glucose control.   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 Status post mutliple transfusions.  - appreciate hematology and GI input - holding methotrexate .    LOS: 2 Daniel Kline 4/20/202512:04 PM

## 2023-11-27 NOTE — Plan of Care (Signed)
  Problem: Education: Goal: Knowledge of General Education information will improve Description: Including pain rating scale, medication(s)/side effects and non-pharmacologic comfort measures Outcome: Progressing   Problem: Clinical Measurements: Goal: Diagnostic test results will improve Outcome: Progressing   Problem: Activity: Goal: Risk for activity intolerance will decrease Outcome: Progressing   Problem: Nutrition: Goal: Adequate nutrition will be maintained Outcome: Progressing   Problem: Coping: Goal: Level of anxiety will decrease Outcome: Progressing   Problem: Elimination: Goal: Will not experience complications related to urinary retention Outcome: Progressing   Problem: Pain Managment: Goal: General experience of comfort will improve and/or be controlled Outcome: Progressing   Problem: Skin Integrity: Goal: Risk for impaired skin integrity will decrease Outcome: Progressing

## 2023-11-27 NOTE — Progress Notes (Signed)
 Inpatient Follow-up/Progress Note   Patient ID: Daniel Kline is a 78 y.o. male.  Overnight Events / Subjective Findings NAEON. Still with dark stool. Hgb stable. Plt mildly lower than yesterday. Neutrophil count improving. No abdominal pain, n/v, hematochezia. Tolerating PO. No other acute GI complaints.  Review of Systems  Constitutional:  Positive for appetite change. Negative for activity change, chills, diaphoresis, fatigue, fever and unexpected weight change.  HENT:  Negative for trouble swallowing and voice change.   Respiratory:  Negative for shortness of breath and wheezing.   Cardiovascular:  Negative for chest pain, palpitations and leg swelling.  Gastrointestinal:  Positive for blood in stool (melena). Negative for abdominal distention, abdominal pain, anal bleeding, constipation, diarrhea, nausea and vomiting.  Musculoskeletal:  Negative for arthralgias and myalgias.  Skin:  Negative for color change and pallor.  Neurological:  Positive for weakness. Negative for dizziness and syncope.  Psychiatric/Behavioral:  Negative for confusion. The patient is not nervous/anxious.   All other systems reviewed and are negative.    Medications  Current Facility-Administered Medications:    0.9 %  sodium chloride  infusion, 10 mL/hr, Intravenous, Once, Cox, Amy N, DO   acetaminophen  (TYLENOL ) tablet 650 mg, 650 mg, Oral, Q6H PRN, 650 mg at 11/27/23 0508 **OR** acetaminophen  (TYLENOL ) suppository 650 mg, 650 mg, Rectal, Q6H PRN, Cox, Amy N, DO   amLODipine  (NORVASC ) tablet 5 mg, 5 mg, Oral, Daily, Sreeram, Narendranath, MD, 5 mg at 11/27/23 1610   carvedilol  (COREG ) tablet 3.125 mg, 3.125 mg, Oral, BID WC, Sreeram, Narendranath, MD, 3.125 mg at 11/27/23 9604   dextrose  50 % solution 50 mL, 1 ampule, Intravenous, PRN, Cox, Amy N, DO   feeding supplement (BOOST / RESOURCE BREEZE) liquid 1 Container, 1 Container, Oral, TID BM, Cox, Amy N, DO, 1 Container at 11/27/23 5409    filgrastim -aafi (NIVESTYM ) injection 300 mcg, 300 mcg, Subcutaneous, q1800, Cox, Amy N, DO, 300 mcg at 11/26/23 1735   folic acid  (FOLVITE ) tablet 1 mg, 1 mg, Oral, Daily, Cox, Amy N, DO, 1 mg at 11/27/23 8119   hydrALAZINE  (APRESOLINE ) injection 5 mg, 5 mg, Intravenous, Q6H PRN, Cox, Amy N, DO   ondansetron  (ZOFRAN ) tablet 4 mg, 4 mg, Oral, Q6H PRN **OR** ondansetron  (ZOFRAN ) injection 4 mg, 4 mg, Intravenous, Q6H PRN, Cox, Amy N, DO   [COMPLETED] pantoprazole  (PROTONIX ) injection 40 mg, 40 mg, Intravenous, Q5 min, 40 mg at 11/25/23 1438 **FOLLOWED BY** pantoprazole  (PROTONIX ) injection 40 mg, 40 mg, Intravenous, Q12H, Cox, Amy N, DO, 40 mg at 11/27/23 1478   rosuvastatin  (CRESTOR ) tablet 10 mg, 10 mg, Oral, Daily, Sreeram, Narendranath, MD, 10 mg at 11/27/23 2956   senna-docusate (Senokot-S) tablet 1 tablet, 1 tablet, Oral, QHS PRN, Cox, Amy N, DO  sodium chloride       acetaminophen  **OR** acetaminophen , dextrose , hydrALAZINE , ondansetron  **OR** ondansetron  (ZOFRAN ) IV, senna-docusate   Objective    Vitals:   11/27/23 0500 11/27/23 0553 11/27/23 0623 11/27/23 0736  BP:   119/63 125/79  Pulse:   71 100  Resp: (!) 22 (!) 22 20   Temp:    98.1 F (36.7 C)  TempSrc:      SpO2:   97% 99%  Weight:      Height:         Physical Exam Vitals and nursing note reviewed.  Constitutional:      General: He is not in acute distress.    Appearance: He is obese. He is ill-appearing. He is not toxic-appearing or diaphoretic.  HENT:     Head: Normocephalic and atraumatic.     Nose: Nose normal.     Mouth/Throat:     Mouth: Mucous membranes are moist.     Pharynx: Oropharynx is clear.  Eyes:     General: No scleral icterus.    Extraocular Movements: Extraocular movements intact.  Cardiovascular:     Rate and Rhythm: Normal rate and regular rhythm.     Heart sounds: Normal heart sounds. No murmur heard.    No friction rub. No gallop.  Pulmonary:     Effort: Pulmonary effort is normal. No  respiratory distress.     Breath sounds: Normal breath sounds. No wheezing, rhonchi or rales.  Abdominal:     General: Abdomen is flat. Bowel sounds are normal. There is no distension.     Palpations: Abdomen is soft.     Tenderness: There is no abdominal tenderness. There is no guarding or rebound.  Musculoskeletal:     Cervical back: Neck supple.     Right lower leg: Edema present.     Left lower leg: Edema present.  Skin:    General: Skin is warm and dry.     Coloration: Skin is pale. Skin is not jaundiced.     Findings: Bruising present.  Neurological:     General: No focal deficit present.     Mental Status: He is alert and oriented to person, place, and time. Mental status is at baseline.  Psychiatric:        Mood and Affect: Mood normal.        Behavior: Behavior normal.        Thought Content: Thought content normal.        Judgment: Judgment normal.      Laboratory Data Recent Labs  Lab 11/25/23 1352 11/26/23 0359 11/27/23 0547  WBC 0.3* 0.5* 1.4*  HGB 6.6* 7.1* 7.4*  HCT 18.9* 19.7* 21.1*  PLT <5* 53* 43*  NEUTOPHILPCT  --  13 10  LYMPHOPCT  --  46 41  MONOPCT  --  30 39  EOSPCT  --  11 5   Recent Labs  Lab 11/25/23 1352 11/26/23 0359  NA 127* 131*  K 4.0 4.2  CL 103 106  CO2 16* 17*  BUN 83* 75*  CREATININE 3.88* 3.55*  CALCIUM  8.4* 8.3*  PROT 5.8* 5.3*  BILITOT 0.7 1.1  ALKPHOS 33* 33*  ALT 45* 44  AST 38 35  GLUCOSE 105* 88   Recent Labs  Lab 11/25/23 1352  INR 1.2      Imaging Studies: CT ABDOMEN PELVIS WO CONTRAST Result Date: 11/25/2023 CLINICAL DATA:  Acute nonlocalized abdominal pain. Dark stool and dark emesis. EXAM: CT ABDOMEN AND PELVIS WITHOUT CONTRAST TECHNIQUE: Multidetector CT imaging of the abdomen and pelvis was performed following the standard protocol without IV contrast. RADIATION DOSE REDUCTION: This exam was performed according to the departmental dose-optimization program which includes automated exposure control,  adjustment of the mA and/or kV according to patient size and/or use of iterative reconstruction technique. COMPARISON:  07/28/2023 FINDINGS: Lower chest: No acute abnormality. Hepatobiliary: Respiratory motion obscures detail in the upper abdomen. Unremarkable noncontrast appearance of the liver. Large gallstones in the decompressed gallbladder. No evidence of acute cholecystitis. No biliary dilation. Pancreas: Unremarkable. Spleen: Unremarkable. Adrenals/Urinary Tract: Normal adrenal glands. No hydronephrosis. Nonobstructing stone in the left kidney. Unchanged 3.0 cm hyperdense right renal lesion. The bladder is unremarkable where not obscured by streak artifact. Stomach/Bowel: No bowel obstruction or bowel  wall thickening. Colonic diverticulosis without diverticulitis. Small hiatal hernia. Normal appendix. Vascular/Lymphatic: Aortic atherosclerosis. No enlarged abdominal or pelvic lymph nodes. Reproductive: Obscured by streak artifact. Other: Fat containing left inguinal and umbilical hernias. No free intraperitoneal fluid or air. Musculoskeletal: Bilateral THAs. Posterior fusion L2-L4. No acute fracture. IMPRESSION: 1. No acute abnormality in the abdomen or pelvis. 2. Cholelithiasis without evidence of acute cholecystitis. 3. Nonobstructing left renal stone. 4. Unchanged 3.0 cm hyperdense right renal lesion, likely a hemorrhagic or proteinaceous cyst however solid neoplasm is not excluded on noncontrast exam. Nonemergent renal protocol MRI with and without IV contrast is recommended. 5. Aortic Atherosclerosis (ICD10-I70.0). Electronically Signed   By: Rozell Cornet M.D.   On: 11/25/2023 17:11   CT HEAD WO CONTRAST ( ) Result Date: 11/25/2023 CLINICAL DATA:  Head trauma, minor.  Headache. EXAM: CT HEAD WITHOUT CONTRAST TECHNIQUE: Contiguous axial images were obtained from the base of the skull through the vertex without intravenous contrast. RADIATION DOSE REDUCTION: This exam was performed according to the  departmental dose-optimization program which includes automated exposure control, adjustment of the mA and/or kV according to patient size and/or use of iterative reconstruction technique. COMPARISON:  Head CT 09/05/2022. FINDINGS: Brain: No acute hemorrhage. 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 Gray-white differentiation is otherwise preserved. No hydrocephalus or extra-axial collection. No mass effect or midline shift. Vascular: No hyperdense vessel. Atherosclerotic calcifications of the carotid siphons and intracranial portions of the vertebral arteries. Skull: No calvarial fracture or suspicious bone lesion. Skull base is unremarkable. 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 Orbits are unremarkable. Other: None. IMPRESSION: 1. No evidence of acute intracranial injury. 2. 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 3. 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 Electronically Signed   By: Audra Blend M.D.   On: 11/25/2023 15:29    Assessment:   # Anemia - suspect multifactorial - combination of methotrexate  bone marrow suppression in setting of AKI leading to low blood counts - with his plt count <5K he is at risk for bleeding spontaneously from any site, melena is likely from oozing within the GI tract (ABLA) - ckd also contributory - s/p 1 u prbc and 2 packs of platelets during this hospitalization thus far - diffuse bruising may also be contributory  - iron studies and b12 do not show signs of deficiency   # suspected ugib with melena and abla- likely 2/2 oozing from severely depressed platelet count   # Pancytopenia in setting of methotrexate  use -  thrombocytopenia 43K  # neutropenia - 0.1 on 11/26/23 --> 0.14 on 4/20  # AKI on CKD # hyponatremia # gerd # RA # dm2, hld, htn, obesity  Plan:   Tentative plan for upper endoscopy tmrw pending neutrophil/platelet count  in am (bmp and cbc ordered for tmrw am) NPO at midnight Protonix  40 mg iv q12 h Hold dvt ppx Hematology has seen and started in filgrastim   Neprhology following Monitor H&H.  Transfusion and resuscitation as per primary team Avoid frequent lab draws to prevent lab induced anemia Supportive care and antiemetics as per primary team Maintain two sites IV access Avoid nsaids Monitor for GIB.  Plt will need to be >50K, Hgb >7 for sedation   Dr. Ole Berkeley will be taking over the GI service tomorrow for the week.  Esophagogastroduodenoscopy with possible biopsy, control of bleeding, polypectomy, and interventions as necessary has been discussed with the patient/patient representative. Informed consent was obtained  from the patient/patient representative after explaining the indication, nature, and risks of the procedure including but not limited to death, bleeding, perforation, missed neoplasm/lesions, cardiorespiratory compromise, and reaction to medications. Opportunity for questions was given and appropriate answers were provided. Patient/patient representative has verbalized understanding is amenable to undergoing the procedure.   Management of other medical comorbidities as per primary team  I personally performed the service.  Thank you for allowing us  to participate in this patient's care. Please don't hesitate to call if any questions or concerns arise.   Quintin Buckle, DO [facility]  Portions of the record may have been created with voice recognition software. Occasional wrong-word or 'sound-a-like' substitutions may have occurred due to the inherent limitations of voice recognition software.  Read the chart carefully and recognize, using context, where substitutions may have occurred.

## 2023-11-27 NOTE — Progress Notes (Signed)
 Progress Note   Patient: Daniel Kline ZOX:[id number] DOB: 07/06/1946 DOA: 11/25/2023     2 DOS: the patient was seen and examined on 11/27/2023   Brief hospital course: 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  Patient is admitted to the hospitalist service for further management evaluation upper GI bleed, pancytopenia, acute kidney injury.  Assessment and Plan: * Upper GI bleed Acute blood loss anemia GI evaluation appreciated. EGD planned for Monday with Dr. Ole Berkeley. Continue Protonix  40 mg IV twice daily 1 unit of PRBC, 2 units of platelets transfused. Goal platelet > 20 at this time, monitor Hb closely. NPO past midnight for EGD Monday.  Acute on CKD stage 4 Etiology is likely multifactorial including secondary to severe anemia and poor p.o. intake Hold Losartan . Nephrology consult appreciated. No HD needed. Continue to monitor daily renal function.  Acute metabolic encephalopathy- Delirium in the setting of pancytopenia, electrolyte abnormalities. Will continue to monitor neuro checks. Delirium precautions.  Type 2 diabetes mellitus with chronic kidney disease, without long-term current use of insulin  (HCC) Hold glipizide . A1C 6.2. Will hold off on checking blood sugars for now, eating poor.  Essential hypertension Home Norvasc , Coreg  resumed. Hydralazine  IV as needed ordered.  GERD (gastroesophageal reflux disease) Protonix  40 mg IV twice daily  Hyponatremia- Sodium improved to 131. Continue trend.   Pancytopenia (HCC) WBC 1.4, plt 43. Hb 7.4 Filgrastim  300 mcg daily until ANC is greater than 1.5 per hematology recommendation.  Anti-cyclic citrullinated peptide antibody positive Methotrexate  on  hold due to pancytopenia.  Cutaneous T-cell lymphoma (HCC) Continue outpatient follow-up with dermatologist as appropriate.  Morbid obesity (HCC) This complicates overall care and prognosis.  Diet, exercise and weight reduction advised.     Out of bed to chair. Incentive spirometry. Nursing supportive care. Fall, aspiration precautions. Diet:  Diet Orders (From admission, onward)     Start     Ordered   11/28/23 0001  Diet NPO time specified  Diet effective midnight        11/27/23 1229   11/26/23 1355  DIET SOFT Fluid consistency: Thin  Diet effective now       Question:  Fluid consistency:  Answer:  Thin   11/26/23 1354           DVT prophylaxis: Place TED hose Start: 11/25/23 1753  Level of care: Progressive   Code Status: Full Code  Subjective: Patient is seen and examined today morning. He was confused last night per wife at bedside. Wishes to go home. Eating fair. Did not get out of bed.   Physical Exam: Vitals:   11/27/23 0500 11/27/23 0553 11/27/23 0623 11/27/23 0736  BP:   119/63 125/79  Pulse:   71 100  Resp: (!) 22 (!) 22 20   Temp:    98.1 F (36.7 C)  TempSrc:      SpO2:   97% 99%  Weight:      Height:        General - Elderly ill Caucasian male, no apparent distress HEENT - PERRLA, EOMI, atraumatic head, non tender sinuses. Lung - Clear, basal rales, no rhonchi, wheezes. Heart - S1, S2 heard, no murmurs, rubs, trace pedal edema. Abdomen - Soft, non tender, bowel sounds good Neuro - Alert, awake and oriented x 3, non focal exam. Skin -  Warm and dry.  Data Reviewed:      Latest Ref Rng & Units 11/27/2023    5:47 AM 11/26/2023    3:59 AM 11/25/2023    1:52 PM  CBC  WBC 4.0 - 10.5 K/uL 1.4  0.5  0.3   Hemoglobin 13.0 - 17.0 g/dL 7.4  7.1  6.6   Hematocrit 39.0 - 52.0 % 21.1  19.7  18.9   Platelets 150 - 400 K/uL 43  53  <5       Latest Ref Rng & Units 11/26/2023    3:59 AM 11/25/2023    1:52 PM 10/26/2023   12:09 PM  BMP  Glucose 70 -  99 mg/dL 88  478  295   BUN 8 - 23 mg/dL 75  83  53   Creatinine 0.61 - 1.24 mg/dL 6.21  3.08  6.57   Sodium 135 - 145 mmol/L 131  127  135   Potassium 3.5 - 5.1 mmol/L 4.2  4.0  4.2   Chloride 98 - 111 mmol/L 106  103  107   CO2 22 - 32 mmol/L 17  16  22    Calcium  8.9 - 10.3 mg/dL 8.3  8.4  8.7    No results found.   Family Communication: Discussed with patient, wife. They  understand and agree. All questions answered.  Disposition: Status is: Inpatient Remains inpatient appropriate because: severe anemia, pancytopenia  Planned Discharge Destination: Home with Home Health     Time spent: 39 minutes  Author: Aisha Hove, MD 11/27/2023 2:30 PM Secure chat 7am to 7pm For on call review [email or web address].

## 2023-11-28 ENCOUNTER — Inpatient Hospital Stay: Admitting: Anesthesiology

## 2023-11-28 ENCOUNTER — Encounter
Admission: EM | Disposition: A | Discharge status: Home Health | Payer: Self-pay | Source: Home / Self Care | Attending: Internal Medicine

## 2023-11-28 DIAGNOSIS — K259 Gastric ulcer, unspecified as acute or chronic, without hemorrhage or perforation: Secondary | ICD-10-CM

## 2023-11-28 DIAGNOSIS — K2970 Gastritis, unspecified, without bleeding: Secondary | ICD-10-CM | POA: Diagnosis not present

## 2023-11-28 DIAGNOSIS — K922 Gastrointestinal hemorrhage, unspecified: Secondary | ICD-10-CM | POA: Diagnosis not present

## 2023-11-28 DIAGNOSIS — K2980 Duodenitis without bleeding: Secondary | ICD-10-CM | POA: Diagnosis not present

## 2023-11-28 DIAGNOSIS — K2289 Other specified disease of esophagus: Secondary | ICD-10-CM

## 2023-11-28 DIAGNOSIS — D61818 Other pancytopenia: Secondary | ICD-10-CM | POA: Diagnosis not present

## 2023-11-28 DIAGNOSIS — N179 Acute kidney failure, unspecified: Secondary | ICD-10-CM | POA: Diagnosis not present

## 2023-11-28 HISTORY — PX: ESOPHAGOGASTRODUODENOSCOPY: SHX5428

## 2023-11-28 LAB — CBC WITH DIFFERENTIAL/PLATELET
Abs Immature Granulocytes: 0.05 10*3/uL (ref 0.00–0.07)
Basophils Absolute: 0 10*3/uL (ref 0.0–0.1)
Basophils Relative: 1 %
Eosinophils Absolute: 0.1 10*3/uL (ref 0.0–0.5)
Eosinophils Relative: 3 %
HCT: 24.1 % — ABNORMAL LOW (ref 39.0–52.0)
Hemoglobin: 8.3 g/dL — ABNORMAL LOW (ref 13.0–17.0)
Immature Granulocytes: 1 %
Lymphocytes Relative: 32 %
Lymphs Abs: 1.1 10*3/uL (ref 0.7–4.0)
MCH: 31.9 pg (ref 26.0–34.0)
MCHC: 34.4 g/dL (ref 30.0–36.0)
MCV: 92.7 fL (ref 80.0–100.0)
Monocytes Absolute: 1.2 10*3/uL — ABNORMAL HIGH (ref 0.1–1.0)
Monocytes Relative: 34 %
Neutro Abs: 1 10*3/uL — ABNORMAL LOW (ref 1.7–7.7)
Neutrophils Relative %: 29 %
Platelets: 56 10*3/uL — ABNORMAL LOW (ref 150–400)
RBC: 2.6 MIL/uL — ABNORMAL LOW (ref 4.22–5.81)
RDW: 16.4 % — ABNORMAL HIGH (ref 11.5–15.5)
Smear Review: DECREASED
WBC: 3.6 10*3/uL — ABNORMAL LOW (ref 4.0–10.5)
nRBC: 2.5 % — ABNORMAL HIGH (ref 0.0–0.2)

## 2023-11-28 LAB — BASIC METABOLIC PANEL WITH GFR
Anion gap: 7 (ref 5–15)
BUN: 47 mg/dL — ABNORMAL HIGH (ref 8–23)
CO2: 19 mmol/L — ABNORMAL LOW (ref 22–32)
Calcium: 8.6 mg/dL — ABNORMAL LOW (ref 8.9–10.3)
Chloride: 110 mmol/L (ref 98–111)
Creatinine, Ser: 3.16 mg/dL — ABNORMAL HIGH (ref 0.61–1.24)
GFR, Estimated: 19 mL/min — ABNORMAL LOW (ref >60)
Glucose, Bld: 143 mg/dL — ABNORMAL HIGH (ref 70–99)
Potassium: 4.5 mmol/L (ref 3.5–5.1)
Sodium: 136 mmol/L (ref 135–145)

## 2023-11-28 SURGERY — EGD (ESOPHAGOGASTRODUODENOSCOPY)
Anesthesia: General

## 2023-11-28 MED ORDER — SODIUM CHLORIDE 0.9 % IV SOLN: INTRAVENOUS | Status: DC

## 2023-11-28 MED ORDER — LIDOCAINE HCL (CARDIAC) PF 100 MG/5ML IV SOSY
PREFILLED_SYRINGE | INTRAVENOUS | Status: DC | PRN
  Administered 2023-11-28: 100 mg via INTRAVENOUS

## 2023-11-28 MED ORDER — PROPOFOL 10 MG/ML IV BOLUS
INTRAVENOUS | Status: DC | PRN
  Administered 2023-11-28: 100 mg via INTRAVENOUS

## 2023-11-28 MED ORDER — PROPOFOL 10 MG/ML IV BOLUS
INTRAVENOUS | Status: AC
  Filled 2023-11-28: qty 20

## 2023-11-28 MED ORDER — LIDOCAINE HCL (PF) 2 % IJ SOLN
INTRAMUSCULAR | Status: AC
  Filled 2023-11-28: qty 5

## 2023-11-28 NOTE — Progress Notes (Signed)
 Progress Note   Patient: Daniel Kline YNW:[id number] DOB: [birth year] DOA: 11/25/2023     3 DOS: the patient was seen and examined on 11/28/2023   Brief hospital course: Mr. Jmarion Christiano is a 78 year old male with history of T-cell lymphoma, rheumatoid arthritis on methotrexate , CKD stage IV, hypertension, neuropathy, non-insulin -dependent diabetes mellitus, hypertension, hyperlipidemia, who presents emergency department for chief concerns of melena stool and falling at home.  Patient is admitted to the hospitalist service for further management evaluation upper GI bleed, pancytopenia, acute kidney injury.  Assessment and Plan: * Upper GI bleed Acute blood loss anemia GI evaluation appreciated. EGD planned for Monday with Dr. Ole Berkeley. Continue Protonix  40 mg IV twice daily 1 unit of PRBC, 2 units of platelets transfused. Goal platelet > 20 at this time, monitor Hb closely. EGD showed suspicious changes for barrett's, non bleeding gastric ulcer, gastritis, duodenitis, will need outpatient EGD for biopsy. Monitor H/H and transfuse if Hb <7.  Acute on CKD stage 4 Etiology is likely multifactorial including secondary to severe anemia and poor p.o. intake Hold Losartan . Nephrology consult appreciated. No HD needed. Kidney function improving. Continue to monitor daily renal function.  Acute metabolic encephalopathy- Delirium in the setting of pancytopenia, electrolyte abnormalities. Avoid sedative, opiates. Will continue to monitor neuro checks. Delirium precautions.  Type 2 diabetes mellitus with chronic kidney disease, without long-term current use of insulin  (HCC) Hold glipizide . A1C 6.2. Will hold off on checking blood sugars for now, eating poor.  Essential hypertension Home Norvasc , Coreg  resumed. Hydralazine  IV as needed ordered.  GERD (gastroesophageal reflux disease) Protonix  40 mg IV twice daily  Hyponatremia- Sodium improved to 136. Continue trend.   Pancytopenia  (HCC) WBC 3.6, plt 56. Hb 8.3 Filgrastim  300 mcg daily until ANC is greater than 1.5 per hematology recommendation.  Anti-cyclic citrullinated peptide antibody positive Methotrexate  on hold due to pancytopenia.  Cutaneous T-cell lymphoma (HCC) Continue outpatient follow-up with dermatologist as appropriate.  Morbid obesity (HCC) This complicates overall care and prognosis.  Diet, exercise and weight reduction advised.     Out of bed to chair. Incentive spirometry. Nursing supportive care. Fall, aspiration precautions. Diet:  Diet Orders (From admission, onward)     Start     Ordered   11/28/23 1249  Diet Heart Fluid consistency: Thin  Diet effective now       Question:  Fluid consistency:  Answer:  Thin   11/28/23 1248           DVT prophylaxis: Place TED hose Start: 11/25/23 1753  Level of care: Progressive   Code Status: Full Code  Subjective: Patient is seen and examined today morning after endoscopy. He is acting appropriately, able to answer me. Wife at bedside.  Physical Exam: Vitals:   11/28/23 0942 11/28/23 0952 11/28/23 1200 11/28/23 1546  BP: (!) 134/41  129/65 (!) 136/52  Pulse: 70 95 83 78  Resp: 19 19 18 18   Temp:   98 F (36.7 C) 98.2 F (36.8 C)  TempSrc:      SpO2: 100% 98% 95% 100%  Weight:      Height:        General - Elderly ill Caucasian male, no apparent distress HEENT - PERRLA, EOMI, atraumatic head, non tender sinuses. Lung - Clear, basal rales, no rhonchi, wheezes. Heart - S1, S2 heard, no murmurs, rubs, trace pedal edema. Abdomen - Soft, non tender, bowel sounds good Neuro - Alert, awake and oriented x 3, non focal exam. Skin - Warm and  dry.  Data Reviewed:      Latest Ref Rng & Units 11/28/2023    5:18 AM 11/27/2023    5:47 AM 11/26/2023    3:59 AM  CBC  WBC 4.0 - 10.5 K/uL 3.6  1.4  0.5   Hemoglobin 13.0 - 17.0 g/dL 8.3  7.4  7.1   Hematocrit 39.0 - 52.0 % 24.1  21.1  19.7   Platelets 150 - 400 K/uL 56  43  53        Latest Ref Rng & Units 11/28/2023    5:18 AM 11/26/2023    3:59 AM 11/25/2023    1:52 PM  BMP  Glucose 70 - 99 mg/dL 478  88  295   BUN 8 - 23 mg/dL 47  75  83   Creatinine 0.61 - 1.24 mg/dL 6.21  3.08  6.57   Sodium 135 - 145 mmol/L 136  131  127   Potassium 3.5 - 5.1 mmol/L 4.5  4.2  4.0   Chloride 98 - 111 mmol/L 110  106  103   CO2 22 - 32 mmol/L 19  17  16    Calcium  8.9 - 10.3 mg/dL 8.6  8.3  8.4    No results found.   Family Communication: Discussed with patient, wife at bedside. They  understand and agree. All questions answered.  Disposition: Status is: Inpatient Remains inpatient appropriate because: severe anemia, pancytopenia  Planned Discharge Destination: Home with Home Health     Time spent: 38 minutes  Author: Aisha Hove, MD 11/28/2023 5:20 PM Secure chat 7am to 7pm For on call review [email or web address].

## 2023-11-28 NOTE — Anesthesia Preprocedure Evaluation (Addendum)
 Anesthesia Evaluation  Patient identified by MRN, date of birth, ID band Patient confused    Reviewed: Allergy & Precautions, H&P , NPO status , Patient's Chart, lab work & pertinent test results  Airway Mallampati: II  TM Distance: >3 FB Neck ROM: full    Dental no notable dental hx.    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 Disucssed the case with his wife and got anesthesia consent and will proceed.   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  Acute metabolic encephalopathy- Delirium in the setting of pancytopenia, electrolyte abnormalities. Will continue to monitor neuro checks. Delirium precautions.   Past Medical History: No date: Anemia     Comment:  H/O No date: Anxiety No date: Arthritis No date: Atrial flutter (HCC)     Comment:  a. s/p post ablation in 04/2017 No date: Chronic kidney disease No date: Complication of anesthesia No date: CTCL (cutaneous T-cell lymphoma) (HCC) No date: Diabetes mellitus without complication (HCC) No date: Diastolic dysfunction     Comment:  a. 03/2022 Echo: EF 60-65%, no  rwma, GrI DD, nl RV fxn. 12/19/2017: Dysplastic nevus     Comment:  Right distal lat. forearm near wrist. Severe atypia,               close to peripheral margin. 06/21/2018: Dysplastic nevus     Comment:  Upper back right paraspinal. Severe atypia, peripheral               margin involved. Excised 07/11/2018, margins free. 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 forearm. SCCis, hypertrophic.  Past Surgical History: No date: BACK SURGERY     Comment:  LUMBAR 2019: CARDIAC ELECTROPHYSIOLOGY STUDY AND ABLATION 05/05/2022: CATARACT EXTRACTION W/PHACO; Right     Comment:  Procedure: CATARACT EXTRACTION PHACO AND INTRAOCULAR               LENS PLACEMENT (IOC) RIGHT;  Surgeon: Annell Kidney, MD;  Location: [facility];  Service:  Ophthalmology;  Laterality: Right;                Diabetic 10.42 01:13.4 04/04/2019: COLONOSCOPY WITH PROPOFOL ; N/A     Comment:  Procedure: COLONOSCOPY WITH PROPOFOL ;  Surgeon: [place],               Alphonsus Jeans, MD;  Location: [facility];  Service:               Gastroenterology;  Laterality: N/A; 02/01/2020: I & D EXTREMITY; Right     Comment:  Procedure: IRRIGATION AND DEBRIDEMENT EXTREMITY with               poly exchange;  Surgeon: Arlyne Lame, MD;  Location:               [facility];  Service: Orthopedics;  Laterality:  Right; No date: INCISION AND DRAINAGE     Comment:  BACK-MRSA INFECTION AFTER BACK SURGERY 01/28/2020: KNEE ARTHROPLASTY; Right     Comment:  Procedure: COMPUTER ASSISTED TOTAL KNEE ARTHROPLASTY;                Surgeon: Arlyne Lame, MD;  Location: [facility] ORS;                Service: Orthopedics;  Laterality: Right; No date: MOUTH SURGERY No date: right elbow surgery No date: right knee surgery No date: right shoulder surgery     Comment:  from polio damage No date: TONSILLECTOMY 04/2016: TOTAL HIP ARTHROPLASTY; Bilateral  BMI    Body Mass Index: 36.72 kg/m      Reproductive/Obstetrics negative OB ROS                             Anesthesia Physical Anesthesia Plan  ASA: 3  Anesthesia Plan: General   Post-op Pain Management: Minimal or no pain anticipated   Induction: Intravenous  PONV Risk Score and Plan: 3 and Propofol  infusion and TIVA  Airway Management Planned: Nasal Cannula  Additional Equipment: None  Intra-op Plan:   Post-operative Plan:   Informed Consent: I have reviewed the patients History and Physical, chart, labs and discussed the procedure including the risks, benefits and alternatives for the proposed anesthesia with the patient or authorized representative who has indicated his/her understanding and acceptance.     Dental Advisory Given and Consent reviewed with POA  Plan Discussed with: CRNA and Surgeon  Anesthesia Plan Comments: (Discussed risks of anesthesia with patient's wife, including possibility of difficulty with spontaneous ventilation under anesthesia necessitating airway intervention, PONV, and rare risks such as cardiac or respiratory or neurological events, and allergic reactions. Discussed the role of CRNA in patient's perioperative care. Patient's wife understands.)        Anesthesia Quick Evaluation

## 2023-11-28 NOTE — Plan of Care (Signed)

## 2023-11-28 NOTE — Progress Notes (Signed)
 [facility]  ROUNDING NOTE   Subjective:   [name] was admitted to [facility] on 11/25/2023 for Upper GI bleed [K92.2] Pancytopenia (HCC) [O53.664] Gastrointestinal hemorrhage, unspecified gastrointestinal hemorrhage type [K92.2]  Renal function improved. Creatinine down to 3.1. Urine output 1.4 L over the preceding 24 hours.   Objective:  Vital signs in last 24 hours:  Temp:  [98 F (36.7 C)-98.9 F (37.2 C)] 98.2 F (36.8 C) (04/21 1546) Pulse Rate:  [67-95] 78 (04/21 1546) Resp:  [16-22] 18 (04/21 1546) BP: (88-146)/(41-93) 136/52 (04/21 1546) SpO2:  [90 %-100 %] 100 % (04/21 1546)  Weight change:  Filed Weights   11/25/23 2143  Weight: 103.2 kg    Intake/Output: I/O last 3 completed shifts: In: 714 [P.O.:714] Out: 1650 [Urine:1650]   Intake/Output this shift:  Total I/O In: 200 [I.V.:200] Out: 550 [Urine:550]  Physical Exam: General: NAD  Head: Normocephalic, atraumatic. Moist oral mucosal membranes  Eyes: Anicteric  Lungs:  Clear to auscultation  Heart: Regular rate and rhythm  Abdomen:  Soft, nontender,   Extremities: o peripheral edema.  Neurologic: Alert and oriented to self, moving all four extremities  Skin: Ecchymoses noted  Access: none    Basic Metabolic Panel: Recent Labs  Lab 11/25/23 1352 11/26/23 0359 11/28/23 0518  NA 127* 131* 136  K 4.0 4.2 4.5  CL 103 106 110  CO2 16* 17* 19*  GLUCOSE 105* 88 143*  BUN 83* 75* 47*  CREATININE 3.88* 3.55* 3.16*  CALCIUM  8.4* 8.3* 8.6*    Liver Function Tests: Recent Labs  Lab 11/25/23 1352 11/26/23 0359  AST 38 35  ALT 45* 44  ALKPHOS 33* 33*  BILITOT 0.7 1.1  PROT 5.8* 5.3*  ALBUMIN 3.1* 2.9*   No results for input(s): "LIPASE", "AMYLASE" in the last 168 hours. No results for input(s): "AMMONIA" in the last 168 hours.  CBC: Recent Labs  Lab 11/25/23 1352 11/26/23 0359 11/27/23 0547 11/28/23 0518  WBC 0.3* 0.5* 1.4* 3.6*  NEUTROABS  --  0.1* 0.1* 1.0*   HGB 6.6* 7.1* 7.4* 8.3*  HCT 18.9* 19.7* 21.1* 24.1*  MCV 95.9 90.4 89.4 92.7  PLT <5* 53* 43* 56*    Cardiac Enzymes: Recent Labs  Lab 11/26/23 0359  CKTOTAL 443*    BNP: Invalid input(s): "POCBNP"  CBG: No results for input(s): "GLUCAP" in the last 168 hours.  Microbiology: Results for orders placed or performed in visit on 10/04/23  Microscopic Examination     Status: Abnormal   Collection Time: 10/04/23  3:26 PM   Urine  Result Value Ref Range Status   WBC, UA 0-5 0 - 5 /hpf Final   RBC, Urine 0-2 0 - 2 /hpf Final   Epithelial Cells (non renal) 0-10 0 - 10 /hpf Final   Casts Present (A) None seen /lpf Final   Cast Type Hyaline casts N/A Final   Mucus, UA Present (A) Not Estab. Final   Bacteria, UA Few None seen/Few Final    Coagulation Studies: No results for input(s): "LABPROT", "INR" in the last 72 hours.   Urinalysis: No results for input(s): "COLORURINE", "LABSPEC", "PHURINE", "GLUCOSEU", "HGBUR", "BILIRUBINUR", "KETONESUR", "PROTEINUR", "UROBILINOGEN", "NITRITE", "LEUKOCYTESUR" in the last 72 hours.  Invalid input(s): "APPERANCEUR"    Imaging: No results found.    Medications:    sodium chloride  200 mL/hr (11/28/23 0928)    amLODipine   5 mg Oral Daily   carvedilol   3.125 mg Oral BID WC   feeding supplement  1  Container Oral TID BM   filgrastim  (NIVESTYM ) SQ  300 mcg Subcutaneous q1800   folic acid   1 mg Oral Daily   pantoprazole  (PROTONIX ) IV  40 mg Intravenous Q12H   rosuvastatin   10 mg Oral Daily   acetaminophen  **OR** acetaminophen , dextrose , hydrALAZINE , ondansetron  **OR** ondansetron  (ZOFRAN ) IV, senna-docusate  Assessment/ Plan:  [name] is a 78 y.o.  male with diabetes mellitus type II, hypertension, glaucoma, hyperlipidemia, T cell lymphoma, and rheumatoid arthritis who is admitted to [facility] on 11/25/2023 for Upper GI bleed [K92.2] Pancytopenia (HCC) [G29.528] Gastrointestinal hemorrhage, unspecified gastrointestinal  hemorrhage type [K92.2]  Acute Kidney Injury on Chronic Kidney Disease stage IV: baseline creatinine of 3.8, GFR of 15 on 11/15/23.  Creatinine 2.6 on 09/23/2023. - holding losartan , dapagliflozin, and spironolactone .  - Creatinine down to 3.1 with good urine output of 1.4 L over the preceding 24 hours.  Continue supportive care for now.  Hypertension with chronic kidney disease: Blood pressure 136/52.  Maintain the patient on amlodipine  and carvedilol .  Diabetes mellitus type II with chronic kidney disease:  - holding glipizide  - continue glucose control.   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 Status post mutliple transfusions.  - appreciate hematology and GI input - holding methotrexate .    LOS: 3 Daniel Kline 4/21/20254:50 PM

## 2023-11-28 NOTE — Plan of Care (Signed)
  Problem: Education: Goal: Knowledge of General Education information will improve Description: Including pain rating scale, medication(s)/side effects and non-pharmacologic comfort measures Outcome: Progressing   Problem: Clinical Measurements: Goal: Will remain free from infection Outcome: Progressing Goal: Diagnostic test results will improve Outcome: Progressing   Problem: Activity: Goal: Risk for activity intolerance will decrease Outcome: Progressing   Problem: Coping: Goal: Level of anxiety will decrease Outcome: Progressing   Problem: Elimination: Goal: Will not experience complications related to bowel motility Outcome: Progressing Goal: Will not experience complications related to urinary retention Outcome: Progressing   Problem: Safety: Goal: Ability to remain free from injury will improve Outcome: Progressing   Problem: Skin Integrity: Goal: Risk for impaired skin integrity will decrease Outcome: Progressing

## 2023-11-28 NOTE — Anesthesia Procedure Notes (Signed)
 Procedure Name: General with mask airway Date/Time: 11/28/2023 9:19 AM  Performed by: Marisue Side, CRNAPre-anesthesia Checklist: Patient identified, Emergency Drugs available, Suction available and Patient being monitored Patient Re-evaluated:Patient Re-evaluated prior to induction Oxygen Delivery Method: Simple face mask Preoxygenation: Pre-oxygenation with 100% oxygen Induction Type: IV induction Comments: POM

## 2023-11-28 NOTE — Anesthesia Postprocedure Evaluation (Signed)
 Anesthesia Post Note  Patient: Daniel Kline  Procedure(s) Performed: EGD (ESOPHAGOGASTRODUODENOSCOPY)  Patient location during evaluation: PACU Anesthesia Type: General Level of consciousness: awake and alert Pain management: pain level controlled Vital Signs Assessment: post-procedure vital signs reviewed and stable Respiratory status: spontaneous breathing, nonlabored ventilation, respiratory function stable and patient connected to nasal cannula oxygen Cardiovascular status: blood pressure returned to baseline and stable Postop Assessment: no apparent nausea or vomiting Anesthetic complications: no  No notable events documented.   Last Vitals:  Vitals:   11/28/23 0942 11/28/23 0952  BP: (!) 134/41   Pulse: 70 95  Resp: 19 19  Temp:    SpO2: 100% 98%    Last Pain:  Vitals:   11/28/23 0952  TempSrc:   PainSc: 0-No pain                 Enrique Harvest

## 2023-11-28 NOTE — Op Note (Signed)
 [facility] Gastroenterology Patient Name: Daniel Kline Procedure Date: 11/28/2023 8:53 AM MRN: [id number] Account #: [id number] Date of Birth: 04-22-1946 Admit Type: Inpatient Age: 78 Room: [facility] ENDO ROOM 4 Gender: Male Note Status: Finalized Instrument Name: Upper Endoscope [id number] Procedure:             Upper GI endoscopy Indications:           Melena Providers:             Marnee Sink MD, MD Referring MD:          Helaine Llanos (Referring MD) Medicines:             Propofol  per Anesthesia Complications:         No immediate complications. Procedure:             Pre-Anesthesia Assessment:                        - Prior to the procedure, a History and Physical was                         performed, and patient medications and allergies were                         reviewed. The patient's tolerance of previous                         anesthesia was also reviewed. The risks and benefits                         of the procedure and the sedation options and risks                         were discussed with the patient. All questions were                         answered, and informed consent was obtained. Prior                         Anticoagulants: The patient has taken no anticoagulant                         or antiplatelet agents. ASA Grade Assessment: II - A                         patient with mild systemic disease. After reviewing                         the risks and benefits, the patient was deemed in                         satisfactory condition to undergo the procedure.                        After obtaining informed consent, the endoscope was                         passed under direct vision. Throughout the procedure,  the patient's blood pressure, pulse, and oxygen                         saturations were monitored continuously. The                         Endosonoscope was introduced through the mouth, and                          advanced to the second part of duodenum. The upper GI                         endoscopy was accomplished without difficulty. The                         patient tolerated the procedure well. Findings:      There were esophageal mucosal changes suspicious for long-segment       Barrett's esophagus present in the lower third of the esophagus.      One non-bleeding cratered gastric ulcer with no stigmata of bleeding was       found in the gastric antrum.      Diffuse moderate inflammation characterized by erythema was found in the       entire examined stomach.      Patchy moderate inflammation characterized by erosions was found in the       duodenal bulb, in the first portion of the duodenum and in the second       portion of the duodenum. Impression:            - Esophageal mucosal changes suspicious for                         long-segment Barrett's esophagus.                        - Non-bleeding gastric ulcer with no stigmata of                         bleeding.                        - Gastritis.                        - Duodenitis.                        - No specimens collected. Recommendation:        - Return patient to hospital ward for ongoing care.                        - Resume previous diet.                        - Continue present medications.                        - The patient should follow up with Dr. Corky Diener as an                         outpatient for a repeat EGD with biopsies  after                         discharge. Procedure Code(s):     --- Professional ---                        [id number], Esophagogastroduodenoscopy, flexible,                         transoral; diagnostic, including collection of                         specimen(s) by brushing or washing, when performed                         (separate procedure) Diagnosis Code(s):     --- Professional ---                        K92.1, Melena (includes Hematochezia)                        K29.70,  Gastritis, unspecified, without bleeding                        K25.9, Gastric ulcer, unspecified as acute or chronic,                         without hemorrhage or perforation CPT copyright 2022 American Medical Association. All rights reserved. The codes documented in this report are preliminary and upon coder review may  be revised to meet current compliance requirements. Marnee Sink MD, MD 11/28/2023 9:32:04 AM This report has been signed electronically. Number of Addenda: 0 Note Initiated On: 11/28/2023 8:53 AM Estimated Blood Loss:  Estimated blood loss: none.      [facility]

## 2023-11-28 NOTE — Transfer of Care (Signed)
 Immediate Anesthesia Transfer of Care Note  Patient: Daniel Kline  Procedure(s) Performed: EGD (ESOPHAGOGASTRODUODENOSCOPY)  Patient Location: Endoscopy Unit  Anesthesia Type:General  Level of Consciousness: drowsy and patient cooperative  Airway & Oxygen Therapy: Patient Spontanous Breathing and Patient connected to face mask oxygen  Post-op Assessment: Report given to RN and Post -op Vital signs reviewed and stable  Post vital signs: Reviewed and stable  Last Vitals:  Vitals Value Taken Time  BP 115/60 11/28/23 0937  Temp 36.8 C 11/28/23 0932  Pulse 71 11/28/23 0937  Resp 19 11/28/23 0937  SpO2 97 % 11/28/23 0937  Vitals shown include unfiled device data.  Last Pain:  Vitals:   11/28/23 0937  TempSrc:   PainSc: 0-No pain         Complications: No notable events documented.

## 2023-11-29 ENCOUNTER — Encounter: Payer: Self-pay | Admitting: Gastroenterology

## 2023-11-29 DIAGNOSIS — D61818 Other pancytopenia: Secondary | ICD-10-CM | POA: Diagnosis not present

## 2023-11-29 DIAGNOSIS — K922 Gastrointestinal hemorrhage, unspecified: Secondary | ICD-10-CM | POA: Diagnosis not present

## 2023-11-29 DIAGNOSIS — N179 Acute kidney failure, unspecified: Secondary | ICD-10-CM | POA: Diagnosis not present

## 2023-11-29 LAB — CBC
HCT: 24.9 % — ABNORMAL LOW (ref 39.0–52.0)
Hemoglobin: 8.5 g/dL — ABNORMAL LOW (ref 13.0–17.0)
MCH: 31.4 pg (ref 26.0–34.0)
MCHC: 34.1 g/dL (ref 30.0–36.0)
MCV: 91.9 fL (ref 80.0–100.0)
Platelets: 87 10*3/uL — ABNORMAL LOW (ref 150–400)
RBC: 2.71 MIL/uL — ABNORMAL LOW (ref 4.22–5.81)
RDW: 16.8 % — ABNORMAL HIGH (ref 11.5–15.5)
WBC: 16.3 10*3/uL — ABNORMAL HIGH (ref 4.0–10.5)
nRBC: 1.1 % — ABNORMAL HIGH (ref 0.0–0.2)

## 2023-11-29 LAB — BASIC METABOLIC PANEL WITH GFR
Anion gap: 8 (ref 5–15)
BUN: 43 mg/dL — ABNORMAL HIGH (ref 8–23)
CO2: 17 mmol/L — ABNORMAL LOW (ref 22–32)
Calcium: 8.8 mg/dL — ABNORMAL LOW (ref 8.9–10.3)
Chloride: 111 mmol/L (ref 98–111)
Creatinine, Ser: 2.92 mg/dL — ABNORMAL HIGH (ref 0.61–1.24)
GFR, Estimated: 21 mL/min — ABNORMAL LOW (ref >60)
Glucose, Bld: 146 mg/dL — ABNORMAL HIGH (ref 70–99)
Potassium: 4.1 mmol/L (ref 3.5–5.1)
Sodium: 136 mmol/L (ref 135–145)

## 2023-11-29 NOTE — Progress Notes (Signed)
 [facility] Liaison Note  11/29/2023  Daniel Kline 10/04/1945 [id number]  Location: RN Hospital Liaison screened the patient remotely at [facility].  Insurance: [employer]   Daniel Kline is a 78 y.o. male who is a Primary Care Patient of Helaine Llanos, MD [facility].  Patient is currently active with Care Management for chronic disease management services.  Patient has been engaged by a Tax inspector.  Our community based plan of care has focused on disease management and community resource support.   Patient will receive a post hospital call and will be evaluated for assessments and disease process education.   Plan:  Pending discharge disposition    [facility] Care Management/Population Health does not replace or interfere with any arrangements made by the Inpatient Transition of Care team.   For questions contact:   Lilla Reichert, RN, BSN Hospital Liaison [facility]   [facility], Population Health Office Hours MTWF  8:00 am-6:00 pm Direct Dial: [phone] mobile [email or web address]

## 2023-11-29 NOTE — Care Management Important Message (Signed)
 Important Message  Patient Details  Name: Daniel Kline MRN: [id number] Date of Birth: Dec 15, 1945   Important Message Given:  Yes - Medicare IM     Anise Kerns 11/29/2023, 10:18 AM

## 2023-11-29 NOTE — Plan of Care (Signed)
  Problem: Pain Managment: Goal: General experience of comfort will improve and/or be controlled Outcome: Progressing   Problem: Safety: Goal: Ability to remain free from injury will improve Outcome: Progressing   Problem: Skin Integrity: Goal: Risk for impaired skin integrity will decrease Outcome: Progressing   Problem: Activity: Goal: Risk for activity intolerance will decrease Outcome: Not Progressing

## 2023-11-29 NOTE — Progress Notes (Signed)
 Progress Note   Patient: Daniel Kline XWR:[id number] DOB: 02-24-1946 DOA: 11/25/2023     4 DOS: the patient was seen and examined on 11/29/2023   Brief hospital course: 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  Patient is admitted to the hospitalist service for further management evaluation upper GI bleed, pancytopenia, acute kidney injury.  Assessment and Plan: * Upper GI bleed Acute blood loss anemia GI evaluation appreciated. EGD planned for Monday with Dr. Ole Berkeley. Continue Protonix  40 mg IV twice daily 1 unit of PRBC, 2 units of platelets transfused. EGD showed suspicious changes for barrett's, non bleeding gastric ulcer, gastritis, duodenitis, he will need outpatient EGD for biopsy which will be set up with Dr. Corky Diener. Monitor H/H and transfuse if Hb <7.  Acute on CKD stage 4 Etiology is likely multifactorial including secondary to severe anemia and poor p.o. intake Hold Losartan . Nephrology consult appreciated. No HD needed. Kidney function improving. Continue to monitor daily renal function.  Acute metabolic encephalopathy- Delirium in the setting of pancytopenia, electrolyte abnormalities. Avoid sedative, opiates. Will continue to monitor neuro checks. Delirium precautions.  Type 2 diabetes mellitus with chronic kidney disease, without long-term current use of insulin  (HCC) Hold glipizide . A1C 6.2. Will hold off on checking blood sugars for now, eating poor.  Essential hypertension Home Norvasc , Coreg  resumed. Hydralazine  IV as needed ordered.  GERD (gastroesophageal reflux disease) Protonix  40 mg IV twice daily  Hyponatremia- Sodium improved to 136. Continue trend.   Pancytopenia (HCC) In the  setting of methotrexate  use. Numbers improved today WBC 16.3, plt 87. Hb 8.5. Continue to hold methotrexate . Stopped Filgrastim  300 mcg therapy. He will need hematology follow up as outpatient.  Anti-cyclic citrullinated peptide antibody positive Methotrexate  on hold due to pancytopenia.  Cutaneous T-cell lymphoma (HCC) Continue outpatient follow-up with dermatologist as appropriate.  Morbid obesity (HCC) This complicates overall care and prognosis.  Diet, exercise and weight reduction advised.     Out of bed to chair. Incentive spirometry. Nursing supportive care. Fall, aspiration precautions. Diet:  Diet Orders (From admission, onward)     Start     Ordered   11/28/23 1249  Diet Heart Fluid consistency: Thin  Diet effective now       Question:  Fluid consistency:  Answer:  Thin   11/28/23 1248           DVT prophylaxis: Place TED hose Start: 11/25/23 1753  Level of care: Progressive   Code Status: Full Code  Subjective: Patient is seen and examined today morning. He is in good spirits, thanks me for helping him. Eating fair. No family at bedside.  Physical Exam: Vitals:   11/28/23 2323 11/29/23 0404 11/29/23 0803 11/29/23 1501  BP: (!) 170/79 (!) 170/76 121/68 (!) 108/55  Pulse: 80 86 82 79  Resp: 20 20  16   Temp: 99.5 F (37.5 C) 98.6 F (37 C)  97.6 F (36.4 C)  TempSrc: Oral     SpO2: 95% 95% 97% 98%  Weight:      Height:        General - Elderly ill Caucasian male, no apparent distress HEENT - PERRLA, EOMI, atraumatic head, non tender sinuses. Lung - Clear, basal rales, no rhonchi, wheezes. Heart - S1, S2 heard, no murmurs, rubs, trace pedal edema. Abdomen - Soft, non  tender, bowel sounds good Neuro - sleeping, arousable, non focal exam. Skin - Warm and dry.  Data Reviewed:      Latest Ref Rng & Units 11/29/2023    4:56 AM 11/28/2023    5:18 AM 11/27/2023    5:47 AM  CBC  WBC 4.0 - 10.5 K/uL 16.3  3.6  1.4   Hemoglobin 13.0 - 17.0 g/dL  8.5  8.3  7.4   Hematocrit 39.0 - 52.0 % 24.9  24.1  21.1   Platelets 150 - 400 K/uL 87  56  43       Latest Ref Rng & Units 11/29/2023    4:56 AM 11/28/2023    5:18 AM 11/26/2023    3:59 AM  BMP  Glucose 70 - 99 mg/dL 161  096  88   BUN 8 - 23 mg/dL 43  47  75   Creatinine 0.61 - 1.24 mg/dL 0.45  4.09  8.11   Sodium 135 - 145 mmol/L 136  136  131   Potassium 3.5 - 5.1 mmol/L 4.1  4.5  4.2   Chloride 98 - 111 mmol/L 111  110  106   CO2 22 - 32 mmol/L 17  19  17    Calcium  8.9 - 10.3 mg/dL 8.8  8.6  8.3    No results found.   Family Communication: Discussed with patient, wife over phone. They  understand and agree. All questions answered.  Disposition: Status is: Inpatient Remains inpatient appropriate because: PT/ OT eval for discharge plan.  Planned Discharge Destination: Home with Home Health     Time spent: 39 minutes  Author: Aisha Hove, MD 11/29/2023 3:43 PM Secure chat 7am to 7pm For on call review [email or web address].

## 2023-11-29 NOTE — Progress Notes (Signed)
 [facility]  ROUNDING NOTE   Subjective:   [name] was admitted to [facility] on 11/25/2023 for Upper GI bleed [K92.2] Pancytopenia (HCC) [U98.119] Gastrointestinal hemorrhage, unspecified gastrointestinal hemorrhage type [K92.2]  Patient laying in bed, no family at bedside Alert Remains confused as well  Creatinine 2.92 Urine output on day shift.   Objective:  Vital signs in last 24 hours:  Temp:  [98 F (36.7 C)-99.5 F (37.5 C)] 98.6 F (37 C) (04/22 0404) Pulse Rate:  [78-91] 82 (04/22 0803) Resp:  [18-20] 20 (04/22 0404) BP: (121-170)/(52-79) 121/68 (04/22 0803) SpO2:  [95 %-100 %] 97 % (04/22 0803)  Weight change:  Filed Weights   11/25/23 2143  Weight: 103.2 kg    Intake/Output: I/O last 3 completed shifts: In: 677 [P.O.:477; I.V.:200] Out: 1950 [Urine:1950]   Intake/Output this shift:  No intake/output data recorded.  Physical Exam: General: NAD  Head: Normocephalic, atraumatic. Moist oral mucosal membranes  Eyes: Anicteric  Lungs:  Clear to auscultation  Heart: Regular rate and rhythm  Abdomen:  Soft, nontender,   Extremities: No peripheral edema.  Neurologic: Alert and oriented to self, moving all four extremities  Skin: Ecchymoses noted  Access: none    Basic Metabolic Panel: Recent Labs  Lab 11/25/23 1352 11/26/23 0359 11/28/23 0518 11/29/23 0456  NA 127* 131* 136 136  K 4.0 4.2 4.5 4.1  CL 103 106 110 111  CO2 16* 17* 19* 17*  GLUCOSE 105* 88 143* 146*  BUN 83* 75* 47* 43*  CREATININE 3.88* 3.55* 3.16* 2.92*  CALCIUM  8.4* 8.3* 8.6* 8.8*    Liver Function Tests: Recent Labs  Lab 11/25/23 1352 11/26/23 0359  AST 38 35  ALT 45* 44  ALKPHOS 33* 33*  BILITOT 0.7 1.1  PROT 5.8* 5.3*  ALBUMIN 3.1* 2.9*   No results for input(s): "LIPASE", "AMYLASE" in the last 168 hours. No results for input(s): "AMMONIA" in the last 168 hours.  CBC: Recent Labs  Lab 11/25/23 1352 11/26/23 0359 11/27/23 0547  11/28/23 0518 11/29/23 0456  WBC 0.3* 0.5* 1.4* 3.6* 16.3*  NEUTROABS  --  0.1* 0.1* 1.0*  --   HGB 6.6* 7.1* 7.4* 8.3* 8.5*  HCT 18.9* 19.7* 21.1* 24.1* 24.9*  MCV 95.9 90.4 89.4 92.7 91.9  PLT <5* 53* 43* 56* 87*    Cardiac Enzymes: Recent Labs  Lab 11/26/23 0359  CKTOTAL 443*    BNP: Invalid input(s): "POCBNP"  CBG: No results for input(s): "GLUCAP" in the last 168 hours.  Microbiology: Results for orders placed or performed in visit on 10/04/23  Microscopic Examination     Status: Abnormal   Collection Time: 10/04/23  3:26 PM   Urine  Result Value Ref Range Status   WBC, UA 0-5 0 - 5 /hpf Final   RBC, Urine 0-2 0 - 2 /hpf Final   Epithelial Cells (non renal) 0-10 0 - 10 /hpf Final   Casts Present (A) None seen /lpf Final   Cast Type Hyaline casts N/A Final   Mucus, UA Present (A) Not Estab. Final   Bacteria, UA Few None seen/Few Final    Coagulation Studies: No results for input(s): "LABPROT", "INR" in the last 72 hours.   Urinalysis: No results for input(s): "COLORURINE", "LABSPEC", "PHURINE", "GLUCOSEU", "HGBUR", "BILIRUBINUR", "KETONESUR", "PROTEINUR", "UROBILINOGEN", "NITRITE", "LEUKOCYTESUR" in the last 72 hours.  Invalid input(s): "APPERANCEUR"    Imaging: No results found.    Medications:    sodium chloride  200 mL/hr (11/28/23 1478)  amLODipine   5 mg Oral Daily   carvedilol   3.125 mg Oral BID WC   feeding supplement  1 Container Oral TID BM   folic acid   1 mg Oral Daily   pantoprazole  (PROTONIX ) IV  40 mg Intravenous Q12H   rosuvastatin   10 mg Oral Daily   acetaminophen  **OR** acetaminophen , dextrose , hydrALAZINE , ondansetron  **OR** ondansetron  (ZOFRAN ) IV, senna-docusate  Assessment/ Plan:  [name] is a 78 y.o.  male with diabetes mellitus type II, hypertension, glaucoma, hyperlipidemia, T cell lymphoma, and rheumatoid arthritis who is admitted to [facility] on 11/25/2023 for Upper GI bleed [K92.2] Pancytopenia (HCC)  [Z61.096] Gastrointestinal hemorrhage, unspecified gastrointestinal hemorrhage type [K92.2]  Acute Kidney Injury on Chronic Kidney Disease stage IV: baseline creatinine of 3.8, GFR of 15 on 11/15/23.  Creatinine 2.6 on 09/23/2023. - holding losartan , dapagliflozin, and spironolactone .  - Creatinine down to 3.1 with good urine output of 1.4 L over the preceding 24 hours.  Continue supportive care for now.  Hypertension with chronic kidney disease: Maintain the patient on amlodipine  and carvedilol . Blood pressure stable  Diabetes mellitus type II with chronic kidney disease:  - holding glipizide   Anemia and pancytopenia with chronic kidney disease: concern for underlying GI bleed. Status post mutliple transfusions.  - appreciate hematology and GI input - holding methotrexate .    LOS: 4 Mattison Golay 4/22/202510:59 AM

## 2023-11-29 NOTE — Progress Notes (Signed)
 Pt unable to follow commands with how to use urinal and call bell at this time. NP Ouma made aware and placed order at 2120.

## 2023-11-29 NOTE — Progress Notes (Signed)
 The patient underwent an EGD yesterday without any sign of active GI bleeding.  Patient had gastritis and duodenitis and what appeared to be Barrett's esophagus.  The patient's hemoglobin is stable and there is no further sign of any GI bleeding. The patient should follow-up for repeat EGD with [facility] clinic since the patient is already established with Dr. Corky Diener over there.  Nothing further to do from a GI point of view at this time.  I will sign off.  Please call if any further GI concerns or questions.  We would like to thank you for the opportunity to participate in the care of Daniel Kline.

## 2023-11-29 NOTE — Plan of Care (Signed)

## 2023-11-30 DIAGNOSIS — K922 Gastrointestinal hemorrhage, unspecified: Secondary | ICD-10-CM | POA: Diagnosis not present

## 2023-11-30 DIAGNOSIS — N179 Acute kidney failure, unspecified: Secondary | ICD-10-CM | POA: Diagnosis not present

## 2023-11-30 DIAGNOSIS — D61818 Other pancytopenia: Secondary | ICD-10-CM | POA: Diagnosis not present

## 2023-11-30 LAB — BASIC METABOLIC PANEL WITH GFR
Anion gap: 4 — ABNORMAL LOW (ref 5–15)
BUN: 42 mg/dL — ABNORMAL HIGH (ref 8–23)
CO2: 21 mmol/L — ABNORMAL LOW (ref 22–32)
Calcium: 9 mg/dL (ref 8.9–10.3)
Chloride: 112 mmol/L — ABNORMAL HIGH (ref 98–111)
Creatinine, Ser: 3.24 mg/dL — ABNORMAL HIGH (ref 0.61–1.24)
GFR, Estimated: 19 mL/min — ABNORMAL LOW (ref >60)
Glucose, Bld: 147 mg/dL — ABNORMAL HIGH (ref 70–99)
Potassium: 4.4 mmol/L (ref 3.5–5.1)
Sodium: 137 mmol/L (ref 135–145)

## 2023-11-30 LAB — CBC
HCT: 25.6 % — ABNORMAL LOW (ref 39.0–52.0)
Hemoglobin: 8.5 g/dL — ABNORMAL LOW (ref 13.0–17.0)
MCH: 31.1 pg (ref 26.0–34.0)
MCHC: 33.2 g/dL (ref 30.0–36.0)
MCV: 93.8 fL (ref 80.0–100.0)
Platelets: 123 10*3/uL — ABNORMAL LOW (ref 150–400)
RBC: 2.73 MIL/uL — ABNORMAL LOW (ref 4.22–5.81)
RDW: 17.3 % — ABNORMAL HIGH (ref 11.5–15.5)
WBC: 32.4 10*3/uL — ABNORMAL HIGH (ref 4.0–10.5)
nRBC: 0.8 % — ABNORMAL HIGH (ref 0.0–0.2)

## 2023-11-30 LAB — GLUCOSE, CAPILLARY
Glucose-Capillary: 223 mg/dL — ABNORMAL HIGH (ref 70–99)
Glucose-Capillary: 194 mg/dL — ABNORMAL HIGH (ref 70–99)
Glucose-Capillary: 204 mg/dL — ABNORMAL HIGH (ref 70–99)

## 2023-11-30 LAB — PATHOLOGIST SMEAR REVIEW

## 2023-11-30 MED ORDER — GUAIFENESIN-DM 100-10 MG/5ML PO SYRP
5.0000 mL | ORAL_SOLUTION | ORAL | Status: DC | PRN
  Administered 2023-12-03 – 2023-12-04 (×3): 5 mL via ORAL
  Filled 2023-11-30 (×4): qty 10

## 2023-11-30 MED ORDER — ISOSORBIDE MONONITRATE ER 30 MG PO TB24
60.0000 mg | ORAL_TABLET | Freq: Every day | ORAL | Status: DC
  Administered 2023-12-03 – 2023-12-11 (×8): 60 mg via ORAL
  Filled 2023-11-30: qty 2
  Filled 2023-11-30 (×3): qty 1
  Filled 2023-11-30: qty 2
  Filled 2023-11-30 (×3): qty 1
  Filled 2023-11-30: qty 2
  Filled 2023-11-30 (×2): qty 1

## 2023-11-30 NOTE — TOC Initial Note (Signed)
 Transition of Care [facility]) - Initial/Assessment Note    Patient Details  Name: Daniel Kline MRN: [id number] Date of Birth: [birth year]  Transition of Care [facility]) CM/SW Contact:    Fadil Macmaster C Blessin Kanno, RN Phone Number: 11/30/2023, 1:38 PM  Clinical Narrative:                  Risk Assessment completed   Admitted for: Upper GI bleed Admitted from: Home with spouse  PCP:Dr. Joelle Musca [facility]acy: [facility]  Current home health/prior home health/DME: walker, cane HH: Patient is active with [facility].  Transportation: Wife will transport home  Attempt to reach [place] at [employer]. No answer. Left a message regarding HH services to request resumption of care order.   Attemp         Patient Goals and CMS Choice            Expected Discharge Plan and Services                                              Prior Living Arrangements/Services                       Activities of Daily Living   ADL Screening (condition at time of admission) Independently performs ADLs?: Yes (appropriate for developmental age) Is the patient deaf or have difficulty hearing?: No Does the patient have difficulty seeing, even when wearing glasses/contacts?: Yes Does the patient have difficulty concentrating, remembering, or making decisions?: Yes  Permission Sought/Granted                  Emotional Assessment              Admission diagnosis:  Upper GI bleed [K92.2] Pancytopenia (HCC) [D61.818] Gastrointestinal hemorrhage, unspecified gastrointestinal hemorrhage type [K92.2] Patient Active Problem List   Diagnosis Date Noted   Pancytopenia (HCC) 11/25/2023   Upper GI bleed 11/25/2023   Eschar of lower leg 11/25/2023   Personal history of atrial flutter 07/29/2023   PAC (premature atrial contraction) 07/29/2023   Chronic venous insufficiency 06/17/2023   Controlled type 2 diabetes mellitus with diabetic nephropathy (HCC) 09/09/2022   Lower extremity edema  09/08/2022   AKI (acute kidney injury) (HCC) 09/08/2022   Type 2 diabetes mellitus with chronic kidney disease, without long-term current use of insulin  (HCC) 09/05/2022   Dyslipidemia 09/05/2022   Hyponatremia 09/05/2022   Paroxysmal atrial fibrillation (HCC) 01/23/2022   Chronic pain 06/13/2020   Anemia 04/02/2020   Benign hypertensive kidney disease with chronic kidney disease 04/02/2020   Renal mass 04/02/2020   Secondary hyperparathyroidism of renal origin (HCC) 04/02/2020   Preventative health care 03/10/2020   Total knee replacement status 01/28/2020   Encounter for long-term (current) use of high-risk medication 06/14/2019   Seronegative rheumatoid arthritis (HCC) 06/14/2019   Chronic narcotic dependence (HCC) 05/18/2019   Anti-cyclic citrullinated peptide antibody positive 05/03/2019   Synovitis of hand 05/03/2019   Type 2 diabetes mellitus with diabetic nephropathy (HCC) 06/22/2018   Chronic bilateral low back pain without sciatica 06/09/2018   Knee pain, chronic 04/12/2018   OSA (obstructive sleep apnea) 09/07/2017   History of poliomyelitis 08/27/2017   Actinic keratosis 07/26/2017   History of total hip arthroplasty, bilateral 06/09/2016   GERD (gastroesophageal reflux disease) 04/21/2016   Asymptomatic gallstones 01/06/2016   Leg ulcer (HCC) 10/29/2015   Stage 3b  chronic kidney disease (HCC) 09/27/2015   Morbid obesity (HCC) 09/27/2015   Primary osteoarthritis of right knee 08/26/2015   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   Hypercholesterolemia 12/20/2011   Essential hypertension 01/12/2010   PCP:  Helaine Llanos, MD Pharmacy:   [facility] - [place], [place] - [address] [place] [address] Phone: [phone] Fax: [phone]  [facility]) - [place], [place] - [address] [facility] [address] [place] [address] [place] [address] Phone: [phone] Fax: [phone]  [id number]  [place], [place] - [address] [place] [address] Phone: [phone] Fax: [phone]  [facility] - [place], [place] - [address] [place] [address] Phone: [phone] Fax: [phone]     Social Drivers of Health (SDOH) Social History: SDOH Screenings   Food Insecurity: No Food Insecurity (11/26/2023)  Housing: Low Risk  (11/26/2023)  Transportation Needs: No Transportation Needs (11/26/2023)  Utilities: Not At Risk (11/26/2023)  Depression (PHQ2-9): Low Risk  (10/31/2023)  Financial Resource Strain: Low Risk  (07/20/2017)  Social Connections: Moderately Isolated (11/26/2023)  Tobacco Use: Medium Risk (11/25/2023)   SDOH Interventions:     Readmission Risk Interventions    09/06/2022    3:47 PM 01/27/2022   12:27 PM  Readmission Risk Prevention Plan  Transportation Screening Complete Complete  PCP or Specialist Appt within 3-5 Days  Complete  HRI or Home Care Consult  Complete  Social Work Consult for Recovery Care Planning/Counseling  Complete  Palliative Care Screening  Complete  Medication Review Oceanographer) Complete Complete  SW Recovery Care/Counseling Consult Complete   Palliative Care Screening Not Applicable

## 2023-11-30 NOTE — Plan of Care (Signed)

## 2023-11-30 NOTE — Evaluation (Signed)
 Occupational Therapy Evaluation Patient Details Name: Daniel Kline MRN: [id number] DOB: [birth year] Today's Date: 11/30/2023   History of Present Illness   Pt is a 78 year old male with history of T-cell lymphoma, rheumatoid arthritis on methotrexate , CKD stage IV, hypertension, neuropathy, non-insulin -dependent diabetes mellitus, hypertension, hyperlipidemia, and per spouse polio who presented to the emergency department for chief concerns of melena stool and falling at home.  MD assessment includes: upper GI bleed, acute blood loss anemia, acute on CKD stage 4, acute metabolic encephalopathy, and hyponatremia.     Clinical Impressions Pt presents with generalized weakness, lethargy, limited endurance, confusion. Pt is able to transfer sit<>stand with Mod A, close SUPV for safety in standing w/ RW, demonstrating poor posture and tiring quickly. Pt is significantly off his PLOF of Mod I, ambulating w/o AD. Pt requires increased time/effort to engage in all activities and to respond to any questions, remaining lethargic throughout session and speaking little. Pt and spouse live in a single-story home, 4 STE. Pt reports 1 fall, 1 "slide" from chair to floor during the past month. Pt also diagnosed with COVID ~ 1 month before. Pt's twin brother died last week; pt's wife reports that she feels pt has declined markedly since that time and that his grief is impacting his physical and cognitive function. Recommend ongoing OT during hospitalization with continuing OT post DC.    If plan is discharge home, recommend the following:   A lot of help with walking and/or transfers;A lot of help with bathing/dressing/bathroom     Functional Status Assessment   Patient has had a recent decline in their functional status and demonstrates the ability to make significant improvements in function in a reasonable and predictable amount of time.     Equipment Recommendations   None recommended by OT      Recommendations for Other Services         Precautions/Restrictions   Precautions Precautions: Fall Restrictions Weight Bearing Restrictions Per Provider Order: No     Mobility Bed Mobility               General bed mobility comments: Pt received, left in recliner    Transfers Overall transfer level: Needs assistance Equipment used: Rolling walker (2 wheels) Transfers: Sit to/from Stand Sit to Stand: Mod assist                  Balance Overall balance assessment: Needs assistance, History of Falls Sitting-balance support: Feet supported, Single extremity supported Sitting balance-Leahy Scale: Good     Standing balance support: Bilateral upper extremity supported, During functional activity, Reliant on assistive device for balance Standing balance-Leahy Scale: Poor Standing balance comment: Pt reports fall at home with facial injury ~ 3 weeks ago, as well as a "slide" from chair to floor < 1 week ago                           ADL either performed or assessed with clinical judgement   ADL Overall ADL's : Needs assistance/impaired                                       General ADL Comments: Min-Mod A for most ADLs, 2/2 lethargy, limited engagement     Vision         Perception         Praxis  Pertinent Vitals/Pain Pain Assessment Pain Assessment: No/denies pain     Extremity/Trunk Assessment Upper Extremity Assessment Upper Extremity Assessment: Generalized weakness RUE Deficits / Details: History of RUE deficits from polio per spouse   Lower Extremity Assessment Lower Extremity Assessment: Generalized weakness       Communication Communication Communication: No apparent difficulties   Cognition Arousal: Lethargic Behavior During Therapy: WFL for tasks assessed/performed               OT - Cognition Comments: increased time and effort to respond to any directions                  Following commands: Impaired Following commands impaired: Follows one step commands with increased time, Follows one step commands inconsistently     Cueing  General Comments   Cueing Techniques: Verbal cues;Tactile cues;Visual cues      Exercises Other Exercises Other Exercises: Educ re: nutrition, importance of movement, rehab process   Shoulder Instructions      Home Living Family/patient expects to be discharged to:: Private residence Living Arrangements: Spouse/significant other Available Help at Discharge: Family;Available 24 hours/day Type of Home: House Home Access: Stairs to enter [employer] of Steps: 4 Entrance Stairs-Rails: Right;Left;Can reach both Home Layout: One level     Bathroom Shower/Tub: Producer, television/film/video: Standard     Home Equipment: Cane - single Librarian, academic (2 wheels)   Additional Comments: PFRW      Prior Functioning/Environment Prior Level of Function : Independent/Modified Independent             Mobility Comments: Mod Ind amb with a SPC PRN limited community distances ADLs Comments: Ind with ADLs    OT Problem List: Impaired balance (sitting and/or standing);Decreased activity tolerance;Decreased cognition;Decreased strength   OT Treatment/Interventions: Self-care/ADL training;Therapeutic exercise;Patient/family education;Balance training;Energy conservation;Therapeutic activities;DME and/or AE instruction;Cognitive remediation/compensation      OT Goals(Current goals can be found in the care plan section)   Acute Rehab OT Goals Patient Stated Goal: to get back to previous level OT Goal Formulation: With patient Time For Goal Achievement: 12/14/23 Potential to Achieve Goals: Good   OT Frequency:  Min 2X/week    Co-evaluation              AM-PAC OT "6 Clicks" Daily Activity     Outcome Measure Help from another person eating meals?: None Help from another person taking care of  personal grooming?: A Little Help from another person toileting, which includes using toliet, bedpan, or urinal?: A Lot Help from another person bathing (including washing, rinsing, drying)?: A Lot Help from another person to put on and taking off regular upper body clothing?: A Little Help from another person to put on and taking off regular lower body clothing?: A Lot 6 Click Score: 16   End of Session Equipment Utilized During Treatment: Rolling walker (2 wheels)  Activity Tolerance: Patient limited by lethargy Patient left: in chair;with call bell/phone within reach;with chair alarm set;with family/visitor present  OT Visit Diagnosis: Unsteadiness on feet (R26.81);Muscle weakness (generalized) (M62.81);Other symptoms and signs involving cognitive function                Time: 1023-1102 OT Time Calculation (min): 39 min Charges:  OT General Charges $OT Visit: 1 Visit OT Evaluation $OT Eval Moderate Complexity: 1 Mod OT Treatments $Self Care/Home Management : 23-37 mins Basil Boston, PhD, MS, OTR/L 11/30/23, 12:29 PM

## 2023-11-30 NOTE — Progress Notes (Signed)
 Pt refused resp. Panel test

## 2023-11-30 NOTE — Progress Notes (Signed)
 [facility]  ROUNDING NOTE   Subjective:   [name] was admitted to [facility] on 11/25/2023 for Upper GI bleed [K92.2] Pancytopenia (HCC) [D61.818] Gastrointestinal hemorrhage, unspecified gastrointestinal hemorrhage type [K92.2]  Patient seen laying in bed Alert and oriented to self Wife at bedside Green mitts in place  Creatinine 3.24   Objective:  Vital signs in last 24 hours:  Temp:  [97.5 F (36.4 C)-98.5 F (36.9 C)] 97.5 F (36.4 C) (04/23 0834) Pulse Rate:  [50-88] 88 (04/23 0834) Resp:  [16-20] 16 (04/23 0834) BP: (108-162)/(55-87) 156/87 (04/23 0834) SpO2:  [95 %-100 %] 99 % (04/23 0834)  Weight change:  Filed Weights   11/25/23 2143  Weight: 103.2 kg    Intake/Output: I/O last 3 completed shifts: In: 240 [P.O.:240] Out: -    Intake/Output this shift:  No intake/output data recorded.  Physical Exam: General: NAD  Head: Normocephalic, atraumatic. Moist oral mucosal membranes  Eyes: Anicteric  Lungs:  Clear to auscultation  Heart: Regular rate and rhythm  Abdomen:  Soft, nontender,   Extremities: No peripheral edema.  Neurologic: Alert and oriented to self, moving all four extremities  Skin: Ecchymoses noted  Access: none    Basic Metabolic Panel: Recent Labs  Lab 11/25/23 1352 11/26/23 0359 11/28/23 0518 11/29/23 0456 11/30/23 0452  NA 127* 131* 136 136 137  K 4.0 4.2 4.5 4.1 4.4  CL 103 106 110 111 112*  CO2 16* 17* 19* 17* 21*  GLUCOSE 105* 88 143* 146* 147*  BUN 83* 75* 47* 43* 42*  CREATININE 3.88* 3.55* 3.16* 2.92* 3.24*  CALCIUM  8.4* 8.3* 8.6* 8.8* 9.0    Liver Function Tests: Recent Labs  Lab 11/25/23 1352 11/26/23 0359  AST 38 35  ALT 45* 44  ALKPHOS 33* 33*  BILITOT 0.7 1.1  PROT 5.8* 5.3*  ALBUMIN 3.1* 2.9*   No results for input(s): "LIPASE", "AMYLASE" in the last 168 hours. No results for input(s): "AMMONIA" in the last 168 hours.  CBC: Recent Labs  Lab 11/26/23 0359 11/27/23 0547  11/28/23 0518 11/29/23 0456 11/30/23 0452  WBC 0.5* 1.4* 3.6* 16.3* 32.4*  NEUTROABS 0.1* 0.1* 1.0*  --   --   HGB 7.1* 7.4* 8.3* 8.5* 8.5*  HCT 19.7* 21.1* 24.1* 24.9* 25.6*  MCV 90.4 89.4 92.7 91.9 93.8  PLT 53* 43* 56* 87* 123*    Cardiac Enzymes: Recent Labs  Lab 11/26/23 0359  CKTOTAL 443*    BNP: Invalid input(s): "POCBNP"  CBG: Recent Labs  Lab 11/30/23 1149  GLUCAP 223*    Microbiology: Results for orders placed or performed in visit on 10/04/23  Microscopic Examination     Status: Abnormal   Collection Time: 10/04/23  3:26 PM   Urine  Result Value Ref Range Status   WBC, UA 0-5 0 - 5 /hpf Final   RBC, Urine 0-2 0 - 2 /hpf Final   Epithelial Cells (non renal) 0-10 0 - 10 /hpf Final   Casts Present (A) None seen /lpf Final   Cast Type Hyaline casts N/A Final   Mucus, UA Present (A) Not Estab. Final   Bacteria, UA Few None seen/Few Final    Coagulation Studies: No results for input(s): "LABPROT", "INR" in the last 72 hours.   Urinalysis: No results for input(s): "COLORURINE", "LABSPEC", "PHURINE", "GLUCOSEU", "HGBUR", "BILIRUBINUR", "KETONESUR", "PROTEINUR", "UROBILINOGEN", "NITRITE", "LEUKOCYTESUR" in the last 72 hours.  Invalid input(s): "APPERANCEUR"    Imaging: No results found.    Medications:  sodium chloride  200 mL/hr (11/28/23 0928)    amLODipine   5 mg Oral Daily   carvedilol   3.125 mg Oral BID WC   feeding supplement  1 Container Oral TID BM   folic acid   1 mg Oral Daily   pantoprazole  (PROTONIX ) IV  40 mg Intravenous Q12H   rosuvastatin   10 mg Oral Daily   acetaminophen  **OR** acetaminophen , dextrose , ondansetron  **OR** ondansetron  (ZOFRAN ) IV, senna-docusate  Assessment/ Plan:  [name] is a 78 y.o.  male with diabetes mellitus type II, hypertension, glaucoma, hyperlipidemia, T cell lymphoma, and rheumatoid arthritis who is admitted to [facility] on 11/25/2023 for Upper GI bleed [K92.2] Pancytopenia (HCC)  [N82.956] Gastrointestinal hemorrhage, unspecified gastrointestinal hemorrhage type [K92.2]  Acute Kidney Injury on Chronic Kidney Disease stage IV: baseline creatinine of 3.8, GFR of 15 on 11/15/23.  Creatinine 2.6 on 09/23/2023. - holding losartan , dapagliflozin, and spironolactone .  - Creatinine rose today, 3.24 from 2.92. -No urine output recorded - No acute indication for dialysis, we will continue to monitor.  Hypertension with chronic kidney disease: Maintain the patient on amlodipine  and carvedilol . Blood pressure acceptable for this patient, 156/87.  Diabetes mellitus type II with chronic kidney disease:  - holding glipizide  - Glucose elevated, primary team to continue management.  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 Status post mutliple transfusions.  - appreciate hematology and GI input - holding methotrexate . - White count has increased, 32.4 from 16.3 - Platelets have improved to 123.    LOS: 5 Daniel Kline 4/23/202512:58 PM

## 2023-11-30 NOTE — Plan of Care (Signed)
  Problem: Clinical Measurements: Goal: Respiratory complications will improve Outcome: Progressing   Problem: Nutrition: Goal: Adequate nutrition will be maintained Outcome: Progressing   Problem: Coping: Goal: Level of anxiety will decrease Outcome: Progressing   Problem: Elimination: Goal: Will not experience complications related to bowel motility Outcome: Progressing   Problem: Elimination: Goal: Will not experience complications related to urinary retention Outcome: Progressing   Problem: Pain Managment: Goal: General experience of comfort will improve and/or be controlled Outcome: Progressing   Problem: Safety: Goal: Ability to remain free from injury will improve Outcome: Progressing

## 2023-11-30 NOTE — Progress Notes (Signed)
 Pt WBC at 32.4. MD Sreeram and incoming shift made aware.

## 2023-11-30 NOTE — Evaluation (Signed)
 Physical Therapy Evaluation Patient Details Name: Daniel Kline MRN: [id number] DOB: [birth year] Today's Date: 11/30/2023  History of Present Illness  Pt is a 78 year old male with history of T-cell lymphoma, rheumatoid arthritis on methotrexate , CKD stage IV, hypertension, neuropathy, non-insulin -dependent diabetes mellitus, hypertension, hyperlipidemia, and per spouse polio who presented to the emergency department for chief concerns of melena stool and falling at home.  MD assessment includes: upper GI bleed, acute blood loss anemia, acute on CKD stage 4, acute metabolic encephalopathy, and hyponatremia.   Clinical Impression  Pt was pleasantly confused but motivated to participate during the session and put forth good effort throughout. Pt required physical assistance with all functional tasks per below.  In standing pt presented with min instability that grossly improved as the session progressed.  Pt was able to take several steps near the EOB and from the bed to the chair with a RW with min A for stability.  PFRW brought to room after session for use during future sessions.  Pt reported no adverse symptoms during the session with SpO2 and HR WNL throughout on O2.  Pt will benefit from continued PT services upon discharge to safely address deficits listed in patient problem list for decreased caregiver assistance and eventual return to PLOF.           If plan is discharge home, recommend the following: A lot of help with walking and/or transfers;A little help with bathing/dressing/bathroom;Assistance with cooking/housework;Direct supervision/assist for medications management;Assist for transportation;Help with stairs or ramp for entrance   Can travel by private vehicle   No    Equipment Recommendations None recommended by PT  Recommendations for Other Services       Functional Status Assessment Patient has had a recent decline in their functional status and demonstrates the ability  to make significant improvements in function in a reasonable and predictable amount of time.     Precautions / Restrictions Precautions Precautions: Fall Restrictions Weight Bearing Restrictions Per Provider Order: No      Mobility  Bed Mobility Overal bed mobility: Needs Assistance Bed Mobility: Supine to Sit     Supine to sit: Mod assist     General bed mobility comments: Mod A for BLE and trunk control    Transfers Overall transfer level: Needs assistance Equipment used: Rolling walker (2 wheels) Transfers: Sit to/from Stand Sit to Stand: Min assist, From elevated surface           General transfer comment: Min A to come to full upright standing and cuing for hand placement    Ambulation/Gait Ambulation/Gait assistance: Min assist Gait Distance (Feet): 8 Feet Assistive device: Rolling walker (2 wheels) Gait Pattern/deviations: Step-through pattern, Decreased step length - right, Decreased step length - left Gait velocity: decreased     General Gait Details: Pt able to take several steps near the EOB and from bed to chair with min A for stability and to guide the RW  Stairs            Wheelchair Mobility     Tilt Bed    Modified Rankin (Stroke Patients Only)       Balance Overall balance assessment: Needs assistance, History of Falls Sitting-balance support: Feet supported, Single extremity supported Sitting balance-Leahy Scale: Good     Standing balance support: Bilateral upper extremity supported, During functional activity, Reliant on assistive device for balance Standing balance-Leahy Scale: Poor  Pertinent Vitals/Pain Pain Assessment Pain Assessment: No/denies pain    Home Living Family/patient expects to be discharged to:: Private residence Living Arrangements: Spouse/significant other Available Help at Discharge: Family;Available 24 hours/day Type of Home: House Home Access: Stairs to  enter Entrance Stairs-Rails: Right;Left;Can reach both Entrance Stairs-Number of Steps: 4   Home Layout: One level Home Equipment: Cane - single point Additional Comments: PFRW    Prior Function               Mobility Comments: Mod Ind amb with a SPC PRN limited community distances ADLs Comments: Ind with ADLs     Extremity/Trunk Assessment   Upper Extremity Assessment Upper Extremity Assessment: Generalized weakness;RUE deficits/detail RUE Deficits / Details: History of RUE deficits from polio per spouse    Lower Extremity Assessment Lower Extremity Assessment: Generalized weakness       Communication   Communication Communication: No apparent difficulties    Cognition Arousal: Alert Behavior During Therapy: WFL for tasks assessed/performed   PT - Cognitive impairments: Orientation, Safety/Judgement                         Following commands: Impaired Following commands impaired: Follows one step commands with increased time     Cueing Cueing Techniques: Verbal cues, Tactile cues, Visual cues     General Comments      Exercises     Assessment/Plan    PT Assessment Patient needs continued PT services  PT Problem List Decreased strength;Decreased activity tolerance;Decreased balance;Decreased mobility;Decreased knowledge of use of DME       PT Treatment Interventions DME instruction;Gait training;Stair training;Functional mobility training;Therapeutic activities;Therapeutic exercise;Balance training;Patient/family education    PT Goals (Current goals can be found in the Care Plan section)  Acute Rehab PT Goals Patient Stated Goal: To get stronger PT Goal Formulation: With patient/family Time For Goal Achievement: 12/13/23 Potential to Achieve Goals: Good    Frequency Min 2X/week     Co-evaluation               AM-PAC PT "6 Clicks" Mobility  Outcome Measure Help needed turning from your back to your side while in a flat bed  without using bedrails?: A Little Help needed moving from lying on your back to sitting on the side of a flat bed without using bedrails?: A Lot Help needed moving to and from a bed to a chair (including a wheelchair)?: A Little Help needed standing up from a chair using your arms (e.g., wheelchair or bedside chair)?: A Little Help needed to walk in hospital room?: A Lot Help needed climbing 3-5 steps with a railing? : Total 6 Click Score: 14    End of Session Equipment Utilized During Treatment: Gait belt;Oxygen Activity Tolerance: Patient tolerated treatment well Patient left: in chair;with call bell/phone within reach;with chair alarm set;with family/visitor present Nurse Communication: Mobility status PT Visit Diagnosis: History of falling (Z91.81);Unsteadiness on feet (R26.81);Difficulty in walking, not elsewhere classified (R26.2);Muscle weakness (generalized) (M62.81)    Time: 1610-9604 PT Time Calculation (min) (ACUTE ONLY): 32 min   Charges:   PT Evaluation $PT Eval Moderate Complexity: 1 Mod   PT General Charges $$ ACUTE PT VISIT: 1 Visit       D. Scott Nathanael Krist PT, DPT 11/30/23, 11:41 AM

## 2023-11-30 NOTE — Progress Notes (Signed)
 Progress Note   Patient: Daniel Kline EAV:[id number] DOB: [birth year] DOA: 11/25/2023     5 DOS: the patient was seen and examined on 11/30/2023   Brief hospital course: 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  Patient is admitted to the hospitalist service for further management evaluation upper GI bleed, pancytopenia, acute kidney injury.  Assessment and Plan: * Upper GI bleed Acute blood loss anemia GI evaluation appreciated. EGD planned for Monday with Dr. Ole Berkeley. Continue Protonix  40 mg IV twice daily 1 unit of PRBC, 2 units of platelets transfused. EGD showed suspicious changes for barrett's, non bleeding gastric ulcer, gastritis, duodenitis, he will need outpatient EGD for biopsy which will be set up with Dr. Corky Diener. Monitor H/H and transfuse if Hb <7.  Acute on CKD stage 4 Etiology is likely multifactorial including secondary to severe anemia and poor p.o. intake Hold Losartan . Nephrology consult appreciated. No HD needed. Kidney function stable. Continue to monitor daily renal function.  Acute metabolic encephalopathy- Delirium in the setting of pancytopenia, electrolyte abnormalities. Avoid sedative, opiates. Will continue to monitor neuro checks. Delirium precautions. Wife mentioned that after his twin brother's death he has been having mental health issues, more sleepy, lethargic.  Type 2 diabetes mellitus with chronic kidney disease, without long-term current use of insulin  (HCC) Hold glipizide . A1C 6.2. Hold off insulin  sliding scale, eating poor.  Essential hypertension Norvasc , Coreg  resumed. Started imdur  today. BP stable. Hydralazine  IV as needed ordered.  GERD (gastroesophageal reflux  disease) Protonix  40 mg IV twice daily  Hyponatremia- Sodium improved..   Pancytopenia (HCC) In the setting of methotrexate  use. Numbers improved today WBC 32, plt 123. Hb 8.5. Continue to hold methotrexate . Stopped Filgrastim  300 mcg therapy. He will need hematology follow up as outpatient.  Anti-cyclic citrullinated peptide antibody positive Methotrexate  on hold due to pancytopenia.  Cutaneous T-cell lymphoma (HCC) Continue outpatient follow-up with dermatologist as appropriate.  Morbid obesity (HCC) This complicates overall care and prognosis.  Diet, exercise and weight reduction advised.     Out of bed to chair. Incentive spirometry. Nursing supportive care. Fall, aspiration precautions. Diet:  Diet Orders (From admission, onward)     Start     Ordered   11/28/23 1249  Diet Heart Fluid consistency: Thin  Diet effective now       Question:  Fluid consistency:  Answer:  Thin   11/28/23 1248           DVT prophylaxis: Place TED hose Start: 11/25/23 1753  Level of care: Progressive   Code Status: Full Code  Subjective: Patient is seen and examined today morning. He is more sleepy and lethargic. Did work with PT, require lot of help. Eating fair. Wife at bedside stated that his condition worsened after death of his brother.  Physical Exam: Vitals:   11/29/23 2051 11/30/23 0033 11/30/23 0503 11/30/23 0834  BP: (!) 162/71 (!) 156/55 (!) 129/57 (!) 156/87  Pulse: (!) 50 68 76 88  Resp: 18 20 20 16   Temp: 97.8 F (36.6 C) 98.5 F (36.9 C) 97.6 F (36.4 C) (!) 97.5 F (36.4 C)  TempSrc: Oral Oral Oral   SpO2: 95% 98% 100% 99%  Weight:      Height:        General - Elderly ill Caucasian male, no apparent  distress HEENT - PERRLA, EOMI, atraumatic head, non tender sinuses. Lung - Clear, basal rales, no rhonchi, wheezes. Heart - S1, S2 heard, no murmurs, rubs, trace pedal edema. Abdomen - Soft, non tender, bowel sounds good Neuro - sleeping, arousable, non  focal exam. Skin - Warm and dry.  Data Reviewed:      Latest Ref Rng & Units 11/30/2023    4:52 AM 11/29/2023    4:56 AM 11/28/2023    5:18 AM  CBC  WBC 4.0 - 10.5 K/uL 32.4  16.3  3.6   Hemoglobin 13.0 - 17.0 g/dL 8.5  8.5  8.3   Hematocrit 39.0 - 52.0 % 25.6  24.9  24.1   Platelets 150 - 400 K/uL 123  87  56       Latest Ref Rng & Units 11/30/2023    4:52 AM 11/29/2023    4:56 AM 11/28/2023    5:18 AM  BMP  Glucose 70 - 99 mg/dL 161  096  045   BUN 8 - 23 mg/dL 42  43  47   Creatinine 0.61 - 1.24 mg/dL 4.09  8.11  9.14   Sodium 135 - 145 mmol/L 137  136  136   Potassium 3.5 - 5.1 mmol/L 4.4  4.1  4.5   Chloride 98 - 111 mmol/L 112  111  110   CO2 22 - 32 mmol/L 21  17  19    Calcium  8.9 - 10.3 mg/dL 9.0  8.8  8.6    No results found.   Family Communication: Discussed with patient's wife at bedside. She  understand and agree. All questions answered.  Disposition: Status is: Inpatient Remains inpatient appropriate because: PT/ OT eval for discharge plan. May need placement.  Planned Discharge Destination: Home with Home Health vs rehab     Time spent: 39 minutes  Author: Aisha Hove, MD 11/30/2023 3:37 PM Secure chat 7am to 7pm For on call review [email or web address].

## 2023-12-01 DIAGNOSIS — K922 Gastrointestinal hemorrhage, unspecified: Secondary | ICD-10-CM | POA: Diagnosis not present

## 2023-12-01 DIAGNOSIS — N179 Acute kidney failure, unspecified: Secondary | ICD-10-CM | POA: Diagnosis not present

## 2023-12-01 DIAGNOSIS — D61818 Other pancytopenia: Secondary | ICD-10-CM | POA: Diagnosis not present

## 2023-12-01 LAB — CBC
HCT: 24.6 % — ABNORMAL LOW (ref 39.0–52.0)
Hemoglobin: 8.1 g/dL — ABNORMAL LOW (ref 13.0–17.0)
MCH: 31 pg (ref 26.0–34.0)
MCHC: 32.9 g/dL (ref 30.0–36.0)
MCV: 94.3 fL (ref 80.0–100.0)
Platelets: 138 10*3/uL — ABNORMAL LOW (ref 150–400)
RBC: 2.61 MIL/uL — ABNORMAL LOW (ref 4.22–5.81)
RDW: 17.2 % — ABNORMAL HIGH (ref 11.5–15.5)
WBC: 28.3 10*3/uL — ABNORMAL HIGH (ref 4.0–10.5)
nRBC: 1 % — ABNORMAL HIGH (ref 0.0–0.2)

## 2023-12-01 LAB — GLUCOSE, CAPILLARY: Glucose-Capillary: 210 mg/dL — ABNORMAL HIGH (ref 70–99)

## 2023-12-01 MED ORDER — PANTOPRAZOLE SODIUM 40 MG PO TBEC
40.0000 mg | DELAYED_RELEASE_TABLET | Freq: Two times a day (BID) | ORAL | Status: DC
  Administered 2023-12-01 – 2023-12-06 (×10): 40 mg via ORAL
  Filled 2023-12-01 (×10): qty 1

## 2023-12-01 NOTE — Progress Notes (Signed)
 I have reviewed and concur with this student's documentation.   Deklyn Trachtenberg, RN 12/01/2023 2:01 PM

## 2023-12-01 NOTE — Plan of Care (Signed)

## 2023-12-01 NOTE — Plan of Care (Signed)
  Problem: Clinical Measurements: Goal: Respiratory complications will improve Outcome: Progressing   Problem: Nutrition: Goal: Adequate nutrition will be maintained Outcome: Progressing   Problem: Elimination: Goal: Will not experience complications related to bowel motility Outcome: Progressing   Problem: Elimination: Goal: Will not experience complications related to urinary retention Outcome: Progressing   Problem: Pain Managment: Goal: General experience of comfort will improve and/or be controlled Outcome: Progressing   Problem: Safety: Goal: Ability to remain free from injury will improve Outcome: Progressing

## 2023-12-01 NOTE — Progress Notes (Signed)
 PHARMACIST - PHYSICIAN COMMUNICATION  DR:   Butch Cashing  CONCERNING: IV to Oral Route Change Policy  RECOMMENDATION: This patient is receiving Pantoprazole  by the intravenous route.  Based on criteria approved by the Pharmacy and Therapeutics Committee, the intravenous medication(s) is/are being converted to the equivalent oral dose form(s).   DESCRIPTION: These criteria include: The patient is eating (either orally or via tube) and/or has been taking other orally administered medications for a least 24 hours The patient has no evidence of active gastrointestinal bleeding or impaired GI absorption (gastrectomy, short bowel, patient on TNA or NPO).  If you have questions about this conversion, please contact the Pharmacy Department   Eliza Grissinger Rodriguez-Guzman PharmD, BCPS 12/01/2023 3:08 PM

## 2023-12-01 NOTE — Plan of Care (Signed)
  Problem: Clinical Measurements: Goal: Ability to maintain clinical measurements within normal limits will improve Outcome: Progressing   Problem: Clinical Measurements: Goal: Respiratory complications will improve Outcome: Progressing   Problem: Pain Managment: Goal: General experience of comfort will improve and/or be controlled Outcome: Progressing   Problem: Safety: Goal: Ability to remain free from injury will improve Outcome: Progressing

## 2023-12-01 NOTE — Progress Notes (Signed)
 Called wife twice but went to voicemail.

## 2023-12-01 NOTE — Progress Notes (Signed)
 Progress Note   Patient: Daniel Kline ZOX:[id number] DOB: May 27, 1946 DOA: 11/25/2023     6 DOS: the patient was seen and examined on 12/01/2023   Brief hospital course: 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  Patient is admitted to the hospitalist service for further management evaluation upper GI bleed, pancytopenia, acute kidney injury.  Assessment and Plan: * Upper GI bleed Acute blood loss anemia GI evaluation appreciated. EGD planned for Monday with Dr. Ole Berkeley. Continue Protonix  40 mg IV twice daily 1 unit of PRBC, 2 units of platelets transfused. EGD showed suspicious changes for barrett's, non bleeding gastric ulcer, gastritis, duodenitis, he will need outpatient EGD for biopsy which will be set up with Dr. Corky Diener. Monitor H/H and transfuse if Hb <7.  Acute on CKD stage 4 Etiology is likely multifactorial including secondary to severe anemia and poor p.o. intake Hold Losartan . Nephrology consult appreciated. No HD needed. Kidney function stable. Continue to monitor daily renal function.  Acute metabolic encephalopathy- Delirium in the setting of pancytopenia, electrolyte abnormalities. Avoid sedative, opiates. Will continue to monitor neuro checks. Delirium precautions. Wife mentioned that after his twin brother's death he has been having mental health issues, more sleepy, lethargic.  Type 2 diabetes mellitus with chronic kidney disease, without long-term current use of insulin  (HCC) Hold glipizide . A1C 6.2. Hold off insulin  sliding scale, eating poor.  Essential hypertension Norvasc , Coreg  resumed. Started imdur  today. BP stable. Hydralazine  IV as needed ordered.  GERD (gastroesophageal reflux  disease) Protonix  40 mg IV twice daily  Hyponatremia- Sodium improved.  Pancytopenia (HCC) In the setting of methotrexate  use. Numbers improved today WBC 32, plt 123. Hb 8.5. Continue to hold methotrexate . Stopped Filgrastim  300 mcg therapy. He will need hematology follow up as outpatient.  Anti-cyclic citrullinated peptide antibody positive Methotrexate  on hold due to pancytopenia.  Cutaneous T-cell lymphoma (HCC) Continue outpatient follow-up with dermatologist as appropriate.  Morbid obesity (HCC) This complicates overall care and prognosis.  Diet, exercise and weight reduction advised.  Debility Due to chronic illness. PT/ OT recommends SNF placement.     Out of bed to chair. Incentive spirometry. Nursing supportive care. Fall, aspiration precautions. Diet:  Diet Orders (From admission, onward)     Start     Ordered   11/28/23 1249  Diet Heart Fluid consistency: Thin  Diet effective now       Question:  Fluid consistency:  Answer:  Thin   11/28/23 1248           DVT prophylaxis: Place TED hose Start: 11/25/23 1753  Level of care: Progressive   Code Status: Full Code  Subjective: Patient is seen and examined today morning. He is more awake, encouraged him to work with PT. He wishes to go home.  Physical Exam: Vitals:   12/01/23 0502 12/01/23 0800 12/01/23 0823 12/01/23 1052  BP:  104/82 104/82 (!) 152/63  Pulse:  83 83 69  Resp:  20  18  Temp:    98.7 F (37.1 C)  TempSrc:    Oral  SpO2:  100%  100%  Weight: 99.6 kg     Height:        General - Elderly ill Caucasian male, no apparent distress HEENT - PERRLA, EOMI, atraumatic head, non tender sinuses. Lung - Clear, basal rales, no rhonchi,  wheezes. Heart - S1, S2 heard, no murmurs, rubs, trace pedal edema. Abdomen - Soft, non tender, bowel sounds good Neuro - sleeping, arousable, non focal exam. Skin - Warm and dry.  Data Reviewed:      Latest Ref Rng & Units 12/01/2023    3:50 AM  11/30/2023    4:52 AM 11/29/2023    4:56 AM  CBC  WBC 4.0 - 10.5 K/uL 28.3  32.4  16.3   Hemoglobin 13.0 - 17.0 g/dL 8.1  8.5  8.5   Hematocrit 39.0 - 52.0 % 24.6  25.6  24.9   Platelets 150 - 400 K/uL 138  123  87       Latest Ref Rng & Units 11/30/2023    4:52 AM 11/29/2023    4:56 AM 11/28/2023    5:18 AM  BMP  Glucose 70 - 99 mg/dL 604  540  981   BUN 8 - 23 mg/dL 42  43  47   Creatinine 0.61 - 1.24 mg/dL 1.91  4.78  2.95   Sodium 135 - 145 mmol/L 137  136  136   Potassium 3.5 - 5.1 mmol/L 4.4  4.1  4.5   Chloride 98 - 111 mmol/L 112  111  110   CO2 22 - 32 mmol/L 21  17  19    Calcium  8.9 - 10.3 mg/dL 9.0  8.8  8.6    No results found.   Disposition: Status is: Inpatient Remains inpatient appropriate because: PT/ OT eval for discharge plan. May need placement.  Planned Discharge Destination: Rehab     Time spent: 39 minutes  Author: Aisha Hove, MD 12/01/2023 4:25 PM Secure chat 7am to 7pm For on call review [email or web address].

## 2023-12-01 NOTE — Progress Notes (Signed)
 Physical Therapy Treatment Patient Details Name: Daniel Kline MRN: [id number] DOB: 1946-05-27 Today's Date: 12/01/2023   History of Present Illness Pt is a 78 year old male with history of T-cell lymphoma, rheumatoid arthritis on methotrexate , CKD stage IV, hypertension, neuropathy, non-insulin -dependent diabetes mellitus, hypertension, hyperlipidemia, and per spouse polio who presented to the emergency department for chief concerns of melena stool and falling at home.  MD assessment includes: upper GI bleed, acute blood loss anemia, acute on CKD stage 4, acute metabolic encephalopathy, and hyponatremia.    PT Comments  Pt agreeable to session and is making limited progress towards goals. Pt able to stand and side step at EOB, however limited by endurance and SOB symptoms. All mobility performed on 2L of O2 and pt encouraged to sit in recliner. Pt requesting to return back to bed after ability to sit at EOB for prolonged time. Will continue to progress.    If plan is discharge home, recommend the following: A lot of help with walking and/or transfers;A little help with bathing/dressing/bathroom;Assistance with cooking/housework;Direct supervision/assist for medications management;Assist for transportation;Help with stairs or ramp for entrance   Can travel by private vehicle     No  Equipment Recommendations  None recommended by PT    Recommendations for Other Services       Precautions / Restrictions Precautions Precautions: Fall Recall of Precautions/Restrictions: Intact Restrictions Weight Bearing Restrictions Per Provider Order: No     Mobility  Bed Mobility Overal bed mobility: Needs Assistance Bed Mobility: Supine to Sit, Sit to Supine     Supine to sit: Mod assist Sit to supine: Min assist   General bed mobility comments: needs cues for initiation of task and heavy assist for trunkal elevation as well as scooting out towards EOB.    Transfers Overall transfer  level: Needs assistance Equipment used: Right platform walker Transfers: Sit to/from Stand Sit to Stand: Min assist           General transfer comment: unable to stand from low bed. Needed bed elevated in order to stand with assist on R hemibody    Ambulation/Gait Ambulation/Gait assistance: Min assist Gait Distance (Feet): 3 Feet Assistive device: Right platform walker Gait Pattern/deviations: Step-to pattern       General Gait Details: only able to take 3-4 lateral steps at EOB, prior to fatigue and pt needing to rest. O2 sats at 93% on 2L, however pt with increased SOB and slight wheezing with exertion. Declines to sit in recliner and request to return back to bed   Stairs             Wheelchair Mobility     Tilt Bed    Modified Rankin (Stroke Patients Only)       Balance Overall balance assessment: Needs assistance, History of Falls Sitting-balance support: Feet supported, Single extremity supported Sitting balance-Leahy Scale: Good     Standing balance support: Bilateral upper extremity supported, During functional activity, Reliant on assistive device for balance Standing balance-Leahy Scale: Poor                              Communication Communication Communication: No apparent difficulties  Cognition Arousal: Alert Behavior During Therapy: WFL for tasks assessed/performed   PT - Cognitive impairments: Orientation, Safety/Judgement                       PT - Cognition Comments: confused, but pleasant Following commands: Impaired  Following commands impaired: Follows one step commands with increased time, Follows one step commands inconsistently    Cueing Cueing Techniques: Verbal cues, Tactile cues, Visual cues  Exercises Other Exercises Other Exercises: seated ther-ex performed on B LE including LAQ and alt marching x 10 reps limited by fatigue    General Comments        Pertinent Vitals/Pain Pain Assessment Pain  Assessment: No/denies pain    Home Living                          Prior Function            PT Goals (current goals can now be found in the care plan section) Acute Rehab PT Goals Patient Stated Goal: To get stronger PT Goal Formulation: With patient/family Time For Goal Achievement: 12/13/23 Potential to Achieve Goals: Good Progress towards PT goals: Progressing toward goals    Frequency    Min 2X/week      PT Plan      Co-evaluation              AM-PAC PT "6 Clicks" Mobility   Outcome Measure  Help needed turning from your back to your side while in a flat bed without using bedrails?: A Little Help needed moving from lying on your back to sitting on the side of a flat bed without using bedrails?: A Lot Help needed moving to and from a bed to a chair (including a wheelchair)?: A Lot Help needed standing up from a chair using your arms (e.g., wheelchair or bedside chair)?: A Lot Help needed to walk in hospital room?: A Lot Help needed climbing 3-5 steps with a railing? : Total 6 Click Score: 12    End of Session Equipment Utilized During Treatment: Gait belt;Oxygen Activity Tolerance: Patient tolerated treatment well Patient left: in bed;with bed alarm set (left with bed in chair position to assist with cough technique) Nurse Communication: Mobility status PT Visit Diagnosis: History of falling (Z91.81);Unsteadiness on feet (R26.81);Difficulty in walking, not elsewhere classified (R26.2);Muscle weakness (generalized) (M62.81)     Time: 4098-1191 PT Time Calculation (min) (ACUTE ONLY): 23 min  Charges:    $Therapeutic Exercise: 8-22 mins $Therapeutic Activity: 8-22 mins PT General Charges $$ ACUTE PT VISIT: 1 Visit                     Amparo Balk, PT, DPT, GCS [phone]    Daniel Kline 12/01/2023, 3:14 PM

## 2023-12-01 NOTE — Progress Notes (Signed)
 [facility]  ROUNDING NOTE   Subjective:   Mr. JAIVON VANBEEK was admitted to [facility] on 11/25/2023 for Upper GI bleed [K92.2] Pancytopenia (HCC) [D61.818] Gastrointestinal hemorrhage, unspecified gastrointestinal hemorrhage type [K92.2]  Patient seen resting quietly No family present Tolerating small meals Room air  No new labs today   Objective:  Vital signs in last 24 hours:  Temp:  [97.5 F (36.4 C)-98.7 F (37.1 C)] 98.7 F (37.1 C) (04/24 1052) Pulse Rate:  [69-97] 69 (04/24 1052) Resp:  [18-20] 18 (04/24 1052) BP: (104-161)/(59-86) 152/63 (04/24 1052) SpO2:  [99 %-100 %] 100 % (04/24 1052) Weight:  [99.6 kg] 99.6 kg (04/24 0502)  Weight change:  Filed Weights   11/25/23 2143 12/01/23 0502  Weight: 103.2 kg 99.6 kg    Intake/Output: I/O last 3 completed shifts: In: 240 [P.O.:240] Out: 700 [Urine:700]   Intake/Output this shift:  No intake/output data recorded.  Physical Exam: General: NAD  Head: Normocephalic, atraumatic. Moist oral mucosal membranes  Eyes: Anicteric  Lungs:  Clear to auscultation  Heart: Regular rate and rhythm  Abdomen:  Soft, nontender,   Extremities: No peripheral edema.  Neurologic: Alert and oriented to self, moving all four extremities  Skin: Ecchymoses noted  Access: none    Basic Metabolic Panel: Recent Labs  Lab 11/25/23 1352 11/26/23 0359 11/28/23 0518 11/29/23 0456 11/30/23 0452  NA 127* 131* 136 136 137  K 4.0 4.2 4.5 4.1 4.4  CL 103 106 110 111 112*  CO2 16* 17* 19* 17* 21*  GLUCOSE 105* 88 143* 146* 147*  BUN 83* 75* 47* 43* 42*  CREATININE 3.88* 3.55* 3.16* 2.92* 3.24*  CALCIUM  8.4* 8.3* 8.6* 8.8* 9.0    Liver Function Tests: Recent Labs  Lab 11/25/23 1352 11/26/23 0359  AST 38 35  ALT 45* 44  ALKPHOS 33* 33*  BILITOT 0.7 1.1  PROT 5.8* 5.3*  ALBUMIN 3.1* 2.9*   No results for input(s): "LIPASE", "AMYLASE" in the last 168 hours. No results for input(s): "AMMONIA" in the last 168  hours.  CBC: Recent Labs  Lab 11/26/23 0359 11/27/23 0547 11/28/23 0518 11/29/23 0456 11/30/23 0452 12/01/23 0350  WBC 0.5* 1.4* 3.6* 16.3* 32.4* 28.3*  NEUTROABS 0.1* 0.1* 1.0*  --   --   --   HGB 7.1* 7.4* 8.3* 8.5* 8.5* 8.1*  HCT 19.7* 21.1* 24.1* 24.9* 25.6* 24.6*  MCV 90.4 89.4 92.7 91.9 93.8 94.3  PLT 53* 43* 56* 87* 123* 138*    Cardiac Enzymes: Recent Labs  Lab 11/26/23 0359  CKTOTAL 443*    BNP: Invalid input(s): "POCBNP"  CBG: Recent Labs  Lab 11/30/23 1149 11/30/23 1701 11/30/23 2014 12/01/23 0144  GLUCAP 223* 194* 204* 210*    Microbiology: Results for orders placed or performed in visit on 10/04/23  Microscopic Examination     Status: Abnormal   Collection Time: 10/04/23  3:26 PM   Urine  Result Value Ref Range Status   WBC, UA 0-5 0 - 5 /hpf Final   RBC, Urine 0-2 0 - 2 /hpf Final   Epithelial Cells (non renal) 0-10 0 - 10 /hpf Final   Casts Present (A) None seen /lpf Final   Cast Type Hyaline casts N/A Final   Mucus, UA Present (A) Not Estab. Final   Bacteria, UA Few None seen/Few Final    Coagulation Studies: No results for input(s): "LABPROT", "INR" in the last 72 hours.   Urinalysis: No results for input(s): "COLORURINE", "LABSPEC", "PHURINE", "GLUCOSEU", "HGBUR", "  BILIRUBINUR", "KETONESUR", "PROTEINUR", "UROBILINOGEN", "NITRITE", "LEUKOCYTESUR" in the last 72 hours.  Invalid input(s): "APPERANCEUR"    Imaging: No results found.    Medications:    sodium chloride  200 mL/hr (11/28/23 0928)    amLODipine   5 mg Oral Daily   carvedilol   3.125 mg Oral BID WC   feeding supplement  1 Container Oral TID BM   folic acid   1 mg Oral Daily   isosorbide  mononitrate  60 mg Oral QAC lunch   pantoprazole  (PROTONIX ) IV  40 mg Intravenous Q12H   rosuvastatin   10 mg Oral Daily   dextrose , guaiFENesin -dextromethorphan , senna-docusate  Assessment/ Plan:  Mr. SHARIEF WAINWRIGHT is a 78 y.o.  male with diabetes mellitus type II,  hypertension, glaucoma, hyperlipidemia, T cell lymphoma, and rheumatoid arthritis who is admitted to [facility] on 11/25/2023 for Upper GI bleed [K92.2] Pancytopenia (HCC) [E95.284] Gastrointestinal hemorrhage, unspecified gastrointestinal hemorrhage type [K92.2]  Acute Kidney Injury on Chronic Kidney Disease stage IV: baseline creatinine of 3.8, GFR of 15 on 11/15/23.  Creatinine 2.6 on 09/23/2023. - holding losartan , dapagliflozin, and spironolactone .  - recorded overnight for urine output - No acute indication for dialysis, we will continue to monitor.  Hypertension with chronic kidney disease: Maintain the patient on amlodipine  and carvedilol . Blood pressure continues to fluctuate.   Diabetes mellitus type II with chronic kidney disease:  - holding glipizide  - Primary team to continue management.  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 Status post mutliple transfusions.  - appreciate hematology and GI input - holding methotrexate . - White count slowly improving - Platelets continue to improve also.     LOS: 6 Lajune Perine 4/24/202512:56 PM

## 2023-12-02 DIAGNOSIS — N179 Acute kidney failure, unspecified: Secondary | ICD-10-CM | POA: Diagnosis not present

## 2023-12-02 DIAGNOSIS — K922 Gastrointestinal hemorrhage, unspecified: Secondary | ICD-10-CM | POA: Diagnosis not present

## 2023-12-02 DIAGNOSIS — D61818 Other pancytopenia: Secondary | ICD-10-CM | POA: Diagnosis not present

## 2023-12-02 LAB — CBC
HCT: 22.1 % — ABNORMAL LOW (ref 39.0–52.0)
Hemoglobin: 7.4 g/dL — ABNORMAL LOW (ref 13.0–17.0)
MCH: 31.5 pg (ref 26.0–34.0)
MCHC: 33.5 g/dL (ref 30.0–36.0)
MCV: 94 fL (ref 80.0–100.0)
Platelets: 150 10*3/uL (ref 150–400)
RBC: 2.35 MIL/uL — ABNORMAL LOW (ref 4.22–5.81)
RDW: 17.4 % — ABNORMAL HIGH (ref 11.5–15.5)
WBC: 15.3 10*3/uL — ABNORMAL HIGH (ref 4.0–10.5)
nRBC: 1.1 % — ABNORMAL HIGH (ref 0.0–0.2)

## 2023-12-02 LAB — CBC WITH DIFFERENTIAL/PLATELET
Abs Immature Granulocytes: 1.08 10*3/uL — ABNORMAL HIGH (ref 0.00–0.07)
Basophils Absolute: 0.1 10*3/uL (ref 0.0–0.1)
Basophils Relative: 1 %
Eosinophils Absolute: 0.1 10*3/uL (ref 0.0–0.5)
Eosinophils Relative: 1 %
HCT: 22.4 % — ABNORMAL LOW (ref 39.0–52.0)
Hemoglobin: 7.4 g/dL — ABNORMAL LOW (ref 13.0–17.0)
Immature Granulocytes: 7 %
Lymphocytes Relative: 14 %
Lymphs Abs: 2.2 10*3/uL (ref 0.7–4.0)
MCH: 32.3 pg (ref 26.0–34.0)
MCHC: 33 g/dL (ref 30.0–36.0)
MCV: 97.8 fL (ref 80.0–100.0)
Monocytes Absolute: 2.8 10*3/uL — ABNORMAL HIGH (ref 0.1–1.0)
Monocytes Relative: 18 %
Neutro Abs: 9 10*3/uL — ABNORMAL HIGH (ref 1.7–7.7)
Neutrophils Relative %: 59 %
Platelets: 146 10*3/uL — ABNORMAL LOW (ref 150–400)
RBC: 2.29 MIL/uL — ABNORMAL LOW (ref 4.22–5.81)
RDW: 17.2 % — ABNORMAL HIGH (ref 11.5–15.5)
Smear Review: NORMAL
WBC: 15.2 10*3/uL — ABNORMAL HIGH (ref 4.0–10.5)
nRBC: 1 % — ABNORMAL HIGH (ref 0.0–0.2)

## 2023-12-02 LAB — RESPIRATORY PANEL BY PCR

## 2023-12-02 LAB — COMP PANEL: LEUKEMIA/LYMPHOMA: Immunophenotypic Profile: 1

## 2023-12-02 LAB — BASIC METABOLIC PANEL WITH GFR
Anion gap: 5 (ref 5–15)
BUN: 44 mg/dL — ABNORMAL HIGH (ref 8–23)
CO2: 19 mmol/L — ABNORMAL LOW (ref 22–32)
Calcium: 8.5 mg/dL — ABNORMAL LOW (ref 8.9–10.3)
Chloride: 108 mmol/L (ref 98–111)
Creatinine, Ser: 3.07 mg/dL — ABNORMAL HIGH (ref 0.61–1.24)
GFR, Estimated: 20 mL/min — ABNORMAL LOW (ref >60)
Glucose, Bld: 199 mg/dL — ABNORMAL HIGH (ref 70–99)
Potassium: 4.1 mmol/L (ref 3.5–5.1)
Sodium: 132 mmol/L — ABNORMAL LOW (ref 135–145)

## 2023-12-02 LAB — GLUCOSE, CAPILLARY: Glucose-Capillary: 195 mg/dL — ABNORMAL HIGH (ref 70–99)

## 2023-12-02 MED ORDER — GUAIFENESIN ER 600 MG PO TB12
1200.0000 mg | ORAL_TABLET | Freq: Two times a day (BID) | ORAL | Status: DC
  Administered 2023-12-02 – 2023-12-11 (×19): 1200 mg via ORAL
  Filled 2023-12-02 (×20): qty 2

## 2023-12-02 NOTE — TOC Progression Note (Signed)
 Transition of Care [facility]) - Progression Note    Patient Details  Name: Daniel Kline MRN: [id number] Date of Birth: [birth year]  [facility] CM/SW Contact  Baird Bombard, RN Phone Number: 12/02/2023, 10:15 AM  Clinical Narrative:    PASRR screening triggered level two.    Expected Discharge Plan: Home w Home Health Services Barriers to Discharge: Continued Medical Work up  Expected Discharge Plan and Services       Living arrangements for the past 2 months: Single Family Home                                       Social Determinants of Health (SDOH) Interventions SDOH Screenings   Food Insecurity: No Food Insecurity (11/26/2023)  Housing: Low Risk  (11/26/2023)  Transportation Needs: No Transportation Needs (11/26/2023)  Utilities: Not At Risk (11/26/2023)  Depression (PHQ2-9): Low Risk  (10/31/2023)  Financial Resource Strain: Low Risk  (07/20/2017)  Social Connections: Moderately Isolated (11/26/2023)  Tobacco Use: Medium Risk (11/25/2023)    Readmission Risk Interventions    11/30/2023    2:01 PM 09/06/2022    3:47 PM 01/27/2022   12:27 PM  Readmission Risk Prevention Plan  Transportation Screening Complete Complete Complete  PCP or Specialist Appt within 3-5 Days   Complete  HRI or Home Care Consult   Complete  Social Work Consult for Recovery Care Planning/Counseling   Complete  Palliative Care Screening   Complete  Medication Review [employer]) Complete Complete Complete  PCP or Specialist appointment within 3-5 days of discharge Complete    HRI or Home Care Consult Complete    SW Recovery Care/Counseling Consult Complete Complete   Palliative Care Screening Not Applicable Not Applicable   Skilled Nursing Facility Not Applicable

## 2023-12-02 NOTE — Progress Notes (Signed)
 [facility]  ROUNDING NOTE   Subjective:   [name] was admitted to [facility] on 11/25/2023 for Upper GI bleed [K92.2] Pancytopenia (HCC) [D61.818] Gastrointestinal hemorrhage, unspecified gastrointestinal hemorrhage type [K92.2]  Patient seen laying in bed Alert and oriented to self No family present  Creatinine 3.07   Objective:  Vital signs in last 24 hours:  Temp:  [97.6 F (36.4 C)-98.5 F (36.9 C)] 97.6 F (36.4 C) (04/25 0843) Pulse Rate:  [69-92] 69 (04/25 0843) Resp:  [15-20] 15 (04/25 0843) BP: (106-163)/(53-75) 163/68 (04/25 0843) SpO2:  [97 %-100 %] 97 % (04/25 0843)  Weight change:  Filed Weights   11/25/23 2143 12/01/23 0502  Weight: 103.2 kg 99.6 kg    Intake/Output: I/O last 3 completed shifts: In: 237 [P.O.:237] Out: 950 [Urine:950]   Intake/Output this shift:  No intake/output data recorded.  Physical Exam: General: NAD  Head: Normocephalic, atraumatic. Moist oral mucosal membranes  Eyes: Anicteric  Lungs:  Clear to auscultation  Heart: Regular rate and rhythm  Abdomen:  Soft, nontender,   Extremities: No peripheral edema.  Neurologic: Alert and oriented to self, moving all four extremities  Skin: Ecchymoses noted  Access: none    Basic Metabolic Panel: Recent Labs  Lab 11/26/23 0359 11/28/23 0518 11/29/23 0456 11/30/23 0452 12/02/23 0410  NA 131* 136 136 137 132*  K 4.2 4.5 4.1 4.4 4.1  CL 106 110 111 112* 108  CO2 17* 19* 17* 21* 19*  GLUCOSE 88 143* 146* 147* 199*  BUN 75* 47* 43* 42* 44*  CREATININE 3.55* 3.16* 2.92* 3.24* 3.07*  CALCIUM  8.3* 8.6* 8.8* 9.0 8.5*    Liver Function Tests: Recent Labs  Lab 11/25/23 1352 11/26/23 0359  AST 38 35  ALT 45* 44  ALKPHOS 33* 33*  BILITOT 0.7 1.1  PROT 5.8* 5.3*  ALBUMIN 3.1* 2.9*   No results for input(s): "LIPASE", "AMYLASE" in the last 168 hours. No results for input(s): "AMMONIA" in the last 168 hours.  CBC: Recent Labs  Lab 11/26/23 0359  11/27/23 0547 11/28/23 0518 11/29/23 0456 11/30/23 0452 12/01/23 0350 12/02/23 0410  WBC 0.5* 1.4* 3.6* 16.3* 32.4* 28.3* 15.2*  15.3*  NEUTROABS 0.1* 0.1* 1.0*  --   --   --  9.0*  HGB 7.1* 7.4* 8.3* 8.5* 8.5* 8.1* 7.4*  7.4*  HCT 19.7* 21.1* 24.1* 24.9* 25.6* 24.6* 22.4*  22.1*  MCV 90.4 89.4 92.7 91.9 93.8 94.3 97.8  94.0  PLT 53* 43* 56* 87* 123* 138* 146*  150    Cardiac Enzymes: Recent Labs  Lab 11/26/23 0359  CKTOTAL 443*    BNP: Invalid input(s): "POCBNP"  CBG: Recent Labs  Lab 11/30/23 1149 11/30/23 1701 11/30/23 2014 12/01/23 0144 12/02/23 0418  GLUCAP 223* 194* 204* 210* 195*    Microbiology: Results for orders placed or performed in visit on 10/04/23  Microscopic Examination     Status: Abnormal   Collection Time: 10/04/23  3:26 PM   Urine  Result Value Ref Range Status   WBC, UA 0-5 0 - 5 /hpf Final   RBC, Urine 0-2 0 - 2 /hpf Final   Epithelial Cells (non renal) 0-10 0 - 10 /hpf Final   Casts Present (A) None seen /lpf Final   Cast Type Hyaline casts N/A Final   Mucus, UA Present (A) Not Estab. Final   Bacteria, UA Few None seen/Few Final    Coagulation Studies: No results for input(s): "LABPROT", "INR" in the last 72 hours.  Urinalysis: No results for input(s): "COLORURINE", "LABSPEC", "PHURINE", "GLUCOSEU", "HGBUR", "BILIRUBINUR", "KETONESUR", "PROTEINUR", "UROBILINOGEN", "NITRITE", "LEUKOCYTESUR" in the last 72 hours.  Invalid input(s): "APPERANCEUR"    Imaging: No results found.    Medications:    sodium chloride  200 mL/hr (11/28/23 0928)    amLODipine   5 mg Oral Daily   carvedilol   3.125 mg Oral BID WC   feeding supplement  1 Container Oral TID BM   folic acid   1 mg Oral Daily   isosorbide  mononitrate  60 mg Oral QAC lunch   pantoprazole   40 mg Oral BID AC   rosuvastatin   10 mg Oral Daily   dextrose , guaiFENesin -dextromethorphan , senna-docusate  Assessment/ Plan:  Daniel Kline is a 78 y.o.  male with  diabetes mellitus type II, hypertension, glaucoma, hyperlipidemia, T cell lymphoma, and rheumatoid arthritis who is admitted to [facility] on 11/25/2023 for Upper GI bleed [K92.2] Pancytopenia (HCC) [Z61.096] Gastrointestinal hemorrhage, unspecified gastrointestinal hemorrhage type [K92.2]  Acute Kidney Injury on Chronic Kidney Disease stage IV: baseline creatinine of 3.8, GFR of 15 on 11/15/23.  Creatinine 2.6 on 09/23/2023. - holding losartan , dapagliflozin, and spironolactone .  - Creatinine remained stable, may indicate new baseline. -No acute indication for dialysis - Will require a follow-up at discharge with Dr. Lamount Pimple.  Hypertension with chronic kidney disease: Maintain the patient on amlodipine  and carvedilol . Blood pressure 163/68.  Diabetes mellitus type II with chronic kidney disease:  - holding glipizide  - Primary team to continue management.  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 Status post mutliple transfusions.  - appreciate hematology and GI input - holding methotrexate . - White count and platelets slowly improving.    LOS: 7 Daniel Kline 4/25/202512:52 PM

## 2023-12-02 NOTE — Plan of Care (Signed)
  Problem: Clinical Measurements: Goal: Ability to maintain clinical measurements within normal limits will improve Outcome: Progressing   Problem: Pain Managment: Goal: General experience of comfort will improve and/or be controlled Outcome: Progressing   Problem: Safety: Goal: Ability to remain free from injury will improve Outcome: Progressing   Problem: Skin Integrity: Goal: Risk for impaired skin integrity will decrease Outcome: Progressing

## 2023-12-02 NOTE — [place] FL2 (Signed)
 [place]  MEDICAID FL2 LEVEL OF CARE FORM     IDENTIFICATION  Patient Name: Daniel Kline Birthdate: 02/20/1946 Sex: male Admission Date (Current Location): 11/25/2023  [facility] and [place] Number:  Chiropodist and Address:  [facility], [address], [place], [place] [address]      Provider Number: [id number]  Attending Physician Name and Address:  Aisha Hove, MD  Relative Name and Phone Number:  Emma, Schupp" (Spouse)  [phone] (Mobile)    Current Level of Care: Hospital Recommended Level of Care: Skilled Nursing Facility Prior Approval Number:    Date Approved/Denied:   PASRR Number:    Discharge Plan: SNF    Current Diagnoses: Patient Active Problem List   Diagnosis Date Noted   Pancytopenia (HCC) 11/25/2023   Upper GI bleed 11/25/2023   Eschar of lower leg 11/25/2023   Personal history of atrial flutter 07/29/2023   PAC (premature atrial contraction) 07/29/2023   Chronic venous insufficiency 06/17/2023   Controlled type 2 diabetes mellitus with diabetic nephropathy (HCC) 09/09/2022   Lower extremity edema 09/08/2022   AKI (acute kidney injury) (HCC) 09/08/2022   Type 2 diabetes mellitus with chronic kidney disease, without long-term current use of insulin  (HCC) 09/05/2022   Dyslipidemia 09/05/2022   Hyponatremia 09/05/2022   Paroxysmal atrial fibrillation (HCC) 01/23/2022   Chronic pain 06/13/2020   Anemia 04/02/2020   Benign hypertensive kidney disease with chronic kidney disease 04/02/2020   Renal mass 04/02/2020   Secondary hyperparathyroidism of renal origin (HCC) 04/02/2020   Preventative health care 03/10/2020   Total knee replacement status 01/28/2020   Encounter for long-term (current) use of high-risk medication 06/14/2019   Seronegative rheumatoid arthritis (HCC) 06/14/2019   Chronic narcotic dependence (HCC) 05/18/2019   Anti-cyclic citrullinated peptide antibody positive 05/03/2019    Synovitis of hand 05/03/2019   Type 2 diabetes mellitus with diabetic nephropathy (HCC) 06/22/2018   Chronic bilateral low back pain without sciatica 06/09/2018   Knee pain, chronic 04/12/2018   OSA (obstructive sleep apnea) 09/07/2017   History of poliomyelitis 08/27/2017   Actinic keratosis 07/26/2017   History of total hip arthroplasty, bilateral 06/09/2016   GERD (gastroesophageal reflux disease) 04/21/2016   Asymptomatic gallstones 01/06/2016   Leg ulcer (HCC) 10/29/2015   Stage 3b chronic kidney disease (HCC) 09/27/2015   Morbid obesity (HCC) 09/27/2015   Primary osteoarthritis of right knee 08/26/2015   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   Hypercholesterolemia 12/20/2011   Essential hypertension 01/12/2010    Orientation RESPIRATION BLADDER Height & Weight     Self, Place  O2 (022L) Incontinent Weight: 99.6 kg Height:  5\' 6"  (167.6 cm)  BEHAVIORAL SYMPTOMS/MOOD NEUROLOGICAL BOWEL NUTRITION STATUS  Other (Comment) (n/a)  (n/a) Incontinent Diet (Heart)  AMBULATORY STATUS COMMUNICATION OF NEEDS Skin   Limited Assist Verbally Bruising, Skin abrasions (Erythema to bilateral buttock, rash to groin)                       Personal Care Assistance Level of Assistance  Bathing, Feeding, Dressing Bathing Assistance: Limited assistance Feeding assistance: Limited assistance Dressing Assistance: Limited assistance     Functional Limitations Info  Hearing   Hearing Info: Impaired      SPECIAL CARE FACTORS FREQUENCY  PT (By licensed PT), OT (By licensed OT)     PT Frequency: Min 2x weekly OT Frequency: Min 2x weekly            Contractures Contractures Info: Not present  Additional Factors Info  Code Status, Allergies Code Status Info: FULL Allergies Info: Bee Venom, Oxycodone , Hydromorphone , Hydroxychloroquine, Zolpidem           Current Medications (12/02/2023):  This is the current hospital active medication list Current Facility-Administered  Medications  Medication Dose Route Frequency Provider Last Rate Last Admin   0.9 %  sodium chloride  infusion  10 mL/hr Intravenous Once Cox, Amy N, DO 200 mL/hr at 11/28/23 7371 Continued from Pre-op at 11/28/23 0626   amLODipine  (NORVASC ) tablet 5 mg  5 mg Oral Daily Sreeram, Narendranath, MD   5 mg at 12/02/23 9485   carvedilol  (COREG ) tablet 3.125 mg  3.125 mg Oral BID WC Sreeram, Narendranath, MD   3.125 mg at 12/02/23 4627   dextrose  50 % solution 50 mL  1 ampule Intravenous PRN Cox, Amy N, DO       feeding supplement (BOOST / RESOURCE BREEZE) liquid 1 Container  1 Container Oral TID BM Cox, Amy N, DO   1 Container at 12/01/23 2100   folic acid  (FOLVITE ) tablet 1 mg  1 mg Oral Daily Cox, Amy N, DO   1 mg at 12/02/23 0350   guaiFENesin -dextromethorphan  (ROBITUSSIN DM) 100-10 MG/5ML syrup 5 mL  5 mL Oral Q4H PRN Sreeram, Narendranath, MD       isosorbide  mononitrate (IMDUR ) 24 hr tablet 60 mg  60 mg Oral QAC lunch Sreeram, Narendranath, MD       pantoprazole  (PROTONIX ) EC tablet 40 mg  40 mg Oral BID AC Sreeram, Narendranath, MD   40 mg at 12/02/23 0938   rosuvastatin  (CRESTOR ) tablet 10 mg  10 mg Oral Daily Sreeram, Narendranath, MD   10 mg at 12/02/23 0958   senna-docusate (Senokot-S) tablet 1 tablet  1 tablet Oral QHS PRN Cox, Amy N, DO         Discharge Medications: Please see discharge summary for a list of discharge medications.  Relevant Imaging Results:  Relevant Lab Results:   Additional Information ss [id number]  Lori Popowski C Emerald Gehres, RN

## 2023-12-02 NOTE — Progress Notes (Signed)
 Tried to explain importance of pt's Imdur  but pt continues to refuse to take it, says, "I don't believe in it."

## 2023-12-02 NOTE — Progress Notes (Signed)
 Progress Note   Patient: Daniel Kline WUJ:[id number] DOB: [birth year] DOA: 11/25/2023     7 DOS: the patient was seen and examined on 12/02/2023   Brief hospital course: [name] is a 78 year old male with history of T-cell lymphoma, rheumatoid arthritis on methotrexate , CKD stage IV, hypertension, neuropathy, non-insulin -dependent diabetes mellitus, hypertension, hyperlipidemia, who presents emergency department for chief concerns of melena stool and falling at home.  Patient is admitted to the hospitalist service for further management evaluation upper GI bleed, pancytopenia, acute kidney injury.  Assessment and Plan: * Upper GI bleed Acute blood loss anemia S/p 1 unit of PRBC, 2 units of platelets transfused. GI evaluated him, appreciated. EGD showed suspicious changes for barrett's, non bleeding gastric ulcer, gastritis, duodenitis, he will need outpatient EGD for biopsy which will be set up with Dr. Corky Diener. Change Protonix  40 mg oral twice daily Monitor H/H and transfuse if Hb <7.  Acute on CKD stage 4 Etiology is likely multifactorial including secondary to severe anemia and poor p.o. intake Hold Losartan . Nephrology consult appreciated. No HD needed. Kidney function stable. Continue to monitor daily renal function.  Acute metabolic encephalopathy- Delirium in the setting of pancytopenia, electrolyte abnormalities. Avoid sedative, opiates. Continue to monitor neuro checks. Delirium precautions. Wife mentioned that after his twin brother's death he has been having mental health issues, more sleepy, lethargic, on and off confused.  Type 2 diabetes mellitus with chronic kidney disease, without long-term current use of insulin  (HCC) Hold glipizide . A1C 6.2. Hold off insulin  sliding scale, eating poor.  Essential hypertension Norvasc , Coreg , imdur  resumed. BP stable. Hydralazine  IV as needed ordered.  GERD (gastroesophageal reflux disease) Protonix  40 mg oral twice  daily  Hyponatremia- Sodium improved.  Pancytopenia (HCC) In the setting of methotrexate  use. Numbers improved today WBC 32, plt 123. Hb 8.5. Continue to hold methotrexate . Stopped Filgrastim  300 mcg therapy. He will need hematology follow up as outpatient.  Rheumatoid arthritis. Anti-cyclic citrullinated peptide antibody positive Methotrexate  on hold due to pancytopenia.  Cutaneous T-cell lymphoma (HCC) Continue outpatient follow-up with dermatologist as appropriate.  Morbid obesity (HCC) This complicates overall care and prognosis.  Diet, exercise and weight reduction advised.  Debility Due to chronic illness. PT/ OT recommends SNF placement. TOC working on placement.     Out of bed to chair. Incentive spirometry. Nursing supportive care. Fall, aspiration precautions. Diet:  Diet Orders (From admission, onward)     Start     Ordered   11/28/23 1249  Diet Heart Fluid consistency: Thin  Diet effective now       Question:  Fluid consistency:  Answer:  Thin   11/28/23 1248           DVT prophylaxis: Place TED hose Start: 11/25/23 1753  Level of care: Progressive   Code Status: Full Code  Subjective: Patient is seen and examined today morning. He is more awake, asks if he can go home. Encouraged to work with PT/ OT. No family at bedside. Eating poor.  Physical Exam: Vitals:   12/01/23 2101 12/02/23 0018 12/02/23 0843 12/02/23 1357  BP: (!) 159/66 (!) 163/75 (!) 163/68 139/67  Pulse: 71 69 69 70  Resp: 20 20 15    Temp:  98.5 F (36.9 C) 97.6 F (36.4 C) 98.3 F (36.8 C)  TempSrc:  Oral    SpO2: 100% 99% 97% 97%  Weight:      Height:        General - Elderly ill Caucasian male, no apparent distress  HEENT - PERRLA, EOMI, atraumatic head, non tender sinuses. Lung - distant breath sounds, basal rales, no rhonchi, wheezes. Heart - S1, S2 heard, no murmurs, rubs, trace pedal edema. Abdomen - Soft, non tender, bowel sounds good Neuro - sleeping,  arousable, non focal exam. Skin - Warm and dry.  Data Reviewed:      Latest Ref Rng & Units 12/02/2023    4:10 AM 12/01/2023    3:50 AM 11/30/2023    4:52 AM  CBC  WBC 4.0 - 10.5 K/uL 4.0 - 10.5 K/uL 15.3    15.2  28.3  32.4   Hemoglobin 13.0 - 17.0 g/dL 86.5 - 78.4 g/dL 7.4    7.4  8.1  8.5   Hematocrit 39.0 - 52.0 % 39.0 - 52.0 % 22.1    22.4  24.6  25.6   Platelets 150 - 400 K/uL 150 - 400 K/uL 150    146  138  123       Latest Ref Rng & Units 12/02/2023    4:10 AM 11/30/2023    4:52 AM 11/29/2023    4:56 AM  BMP  Glucose 70 - 99 mg/dL 696  295  284   BUN 8 - 23 mg/dL 44  42  43   Creatinine 0.61 - 1.24 mg/dL 1.32  4.40  1.02   Sodium 135 - 145 mmol/L 132  137  136   Potassium 3.5 - 5.1 mmol/L 4.1  4.4  4.1   Chloride 98 - 111 mmol/L 108  112  111   CO2 22 - 32 mmol/L 19  21  17    Calcium  8.9 - 10.3 mg/dL 8.5  9.0  8.8    No results found.  Disposition: Status is: Inpatient Remains inpatient appropriate because: pending placement.  Planned Discharge Destination: Rehab     Time spent: 37 minutes  Author: Aisha Hove, MD 12/02/2023 4:19 PM Secure chat 7am to 7pm For on call review [email or web address].

## 2023-12-02 NOTE — Progress Notes (Signed)
 Pt's wife bedside begging for pt to not be sent home but to go to rehab. Told her I would relay the message via nursing note in his chart to staff.

## 2023-12-02 NOTE — Progress Notes (Signed)
 RE: Daniel Kline. [name] Date of Birth: September 19, 1945 Date: 12/02/2023     To Whom It May Concern:   Please be advised that the above-named patient will require a short-term nursing home stay - anticipated 30 days or less for rehabilitation and strengthening.  The plan is for return home

## 2023-12-02 NOTE — TOC Progression Note (Signed)
 Transition of Care [facility]) - Progression Note    Patient Details  Name: Daniel Kline MRN: [id number] Date of Birth: [birth year]  [facility] CM/SW Contact  Baird Bombard, RN Phone Number: 12/02/2023, 10:13 AM  Clinical Narrative:    Spoke with patient's wife regarding therapy's recommendation for SNF. She is agreeable to SNF and would like [facility] as her first choice.   Bed search started.    Expected Discharge Plan: Home w Home Health Services Barriers to Discharge: Continued Medical Work up  Expected Discharge Plan and Services       Living arrangements for the past 2 months: Single Family Home                                       Social Determinants of Health (SDOH) Interventions SDOH Screenings   Food Insecurity: No Food Insecurity (11/26/2023)  Housing: Low Risk  (11/26/2023)  Transportation Needs: No Transportation Needs (11/26/2023)  Utilities: Not At Risk (11/26/2023)  Depression (PHQ2-9): Low Risk  (10/31/2023)  Financial Resource Strain: Low Risk  (07/20/2017)  Social Connections: Moderately Isolated (11/26/2023)  Tobacco Use: Medium Risk (11/25/2023)    Readmission Risk Interventions    11/30/2023    2:01 PM 09/06/2022    3:47 PM 01/27/2022   12:27 PM  Readmission Risk Prevention Plan  Transportation Screening Complete Complete Complete  PCP or Specialist Appt within 3-5 Days   Complete  HRI or Home Care Consult   Complete  Social Work Consult for Recovery Care Planning/Counseling   Complete  Palliative Care Screening   Complete  Medication Review Oceanographer) Complete Complete Complete  PCP or Specialist appointment within 3-5 days of discharge Complete    HRI or Home Care Consult Complete    SW Recovery Care/Counseling Consult Complete Complete   Palliative Care Screening Not Applicable Not Applicable   Skilled Nursing Facility Not Applicable

## 2023-12-02 NOTE — Progress Notes (Signed)
 Pt refused respiratory panel but was educated about its importance.

## 2023-12-03 DIAGNOSIS — K922 Gastrointestinal hemorrhage, unspecified: Secondary | ICD-10-CM | POA: Diagnosis not present

## 2023-12-03 LAB — CBC
HCT: 23.6 % — ABNORMAL LOW (ref 39.0–52.0)
Hemoglobin: 8.4 g/dL — ABNORMAL LOW (ref 13.0–17.0)
MCH: 33.3 pg (ref 26.0–34.0)
MCHC: 35.6 g/dL (ref 30.0–36.0)
MCV: 93.7 fL (ref 80.0–100.0)
Platelets: 174 10*3/uL (ref 150–400)
RBC: 2.52 MIL/uL — ABNORMAL LOW (ref 4.22–5.81)
RDW: 16.9 % — ABNORMAL HIGH (ref 11.5–15.5)
WBC: 10.4 10*3/uL (ref 4.0–10.5)
nRBC: 1.2 % — ABNORMAL HIGH (ref 0.0–0.2)

## 2023-12-03 LAB — BASIC METABOLIC PANEL WITH GFR
Anion gap: 7 (ref 5–15)
BUN: 38 mg/dL — ABNORMAL HIGH (ref 8–23)
CO2: 20 mmol/L — ABNORMAL LOW (ref 22–32)
Calcium: 8.6 mg/dL — ABNORMAL LOW (ref 8.9–10.3)
Chloride: 106 mmol/L (ref 98–111)
Creatinine, Ser: 3.16 mg/dL — ABNORMAL HIGH (ref 0.61–1.24)
GFR, Estimated: 19 mL/min — ABNORMAL LOW (ref >60)
Glucose, Bld: 212 mg/dL — ABNORMAL HIGH (ref 70–99)
Potassium: 3.9 mmol/L (ref 3.5–5.1)
Sodium: 133 mmol/L — ABNORMAL LOW (ref 135–145)

## 2023-12-03 MED ORDER — ALBUTEROL SULFATE (2.5 MG/3ML) 0.083% IN NEBU: 2.5000 mg | INHALATION_SOLUTION | RESPIRATORY_TRACT | Status: DC | PRN

## 2023-12-03 NOTE — Progress Notes (Signed)
 Progress Note   Patient: Daniel Kline:[id number] DOB: [birth year] DOA: 11/25/2023     8 DOS: the patient was seen and examined on 12/03/2023   Brief hospital course: 78yo with h/o cutaneous T-cell lymphoma, rheumatoid arthritis on methotrexate , CKD stage IV, hypertension, neuropathy, non-insulin -dependent diabetes mellitus, hypertension, and hyperlipidemia who presented on 4/18 with melena and falls.  He required 1 unit of PRBC; EGD showed changes suspicious for Barrett's esophagus, non-bleeding gastric ulcer, and gastritis/duodenitis.  He is recommended for outpatient EGD with biopsy.  Also with pancytopenia in the setting of methotrexate  use; he was treated with Filgrastim  x 3 days with improvement.  He is stable for SNF rehab.  Assessment and Plan:  Upper GI bleed Acute blood loss anemia S/p 1 unit of PRBC, 2 units of platelets transfused GI consulted EGD showed suspicious changes for barrett's, non bleeding gastric ulcer, gastritis, duodenitis He will need outpatient EGD for biopsy which will be set up with Dr. Corky Diener Change to Protonix  40 mg oral twice daily Monitor H/H and transfuse if Hb <7 (currently 8.4)   Acute on CKD stage 4 Etiology is likely multifactorial including secondary to severe anemia and poor p.o. intake Hold Losartan , Dapaglifozin, and spironolactone  Nephrology consulted - no HD needed Kidney function stable Avoid nephrotoxic agents Outpatient nephrology f/u   Acute metabolic encephalopathy- Delirium in the setting of pancytopenia, electrolyte abnormalities Avoid sedatives, opiates Delirium precautions Wife mentioned that after his twin brother's death he has been having mental health issues, more sleepy, lethargic, on and off confused   Type 2 diabetes mellitus with chronic kidney disease, without long-term current use of insulin   A1c 6.2, well controlled Hold glipizide  Hold off insulin  sliding scale, as he is eating poorly   Essential  hypertension Norvasc , Coreg , imdur  resumed Hydralazine  IV as needed ordered   GERD (gastroesophageal reflux disease) Protonix  40 mg oral twice daily   Hyponatremia Stable, not clinically significant   Pancytopenia In the setting of methotrexate  use Appears to be stable at this time Continue to hold methotrexate  Given Filgrastim  therapy x 3 days Outpatient hematology f/u   Rheumatoid arthritis Anti-cyclic citrullinated peptide antibody positive Methotrexate  on hold due to pancytopenia Outpatient rheumatology follow up   Cutaneous T-cell lymphoma  Continue outpatient follow-up with dermatologist as appropriate   Class 2 Obesity Body mass index is 35.44 kg/m.Aaron Aas  Weight loss should be encouraged Outpatient PCP/bariatric medicine f/u encouraged Significantly low or high BMI is associated with higher medical risk including morbidity and mortality    Debility Due to chronic illness PT/ OT recommends SNF placement TOC working on placement.   Consultants: Oncology GI Nephrology PT OT [place] team  Procedures: EGD 4/21  Antibiotics: None  30 Day Unplanned Readmission Risk Score    Flowsheet Row ED to Hosp-Admission (Current) from 11/25/2023 in [facility]U  30 Day Unplanned Readmission Risk Score (%) 37.89 Filed at 12/03/2023 0800       This score is the patient's risk of an unplanned readmission within 30 days of being discharged (0 -100%). The score is based on dignosis, age, lab data, medications, orders, and past utilization.   Low:  0-14.9   Medium: 15-21.9   High: 22-29.9   Extreme: 30 and above           Subjective: He says he feels terrible all over.  He also started telling me that he knows who robbed a certain store nearby (confused).  +wheezing this AM.   Objective: Vitals:  12/03/23 0847 12/03/23 1120  BP: (!) 127/59 (!) 141/63  Pulse: 62 (!) 57  Resp: 20 16  Temp: 97.9 F (36.6 C) 97.8 F (36.6 C)  SpO2: 100% 98%     Intake/Output Summary (Last 24 hours) at 12/03/2023 1449 Last data filed at 12/03/2023 0056 Gross per 24 hour  Intake --  Output 1300 ml  Net -1300 ml   Filed Weights   11/25/23 2143 12/01/23 0502  Weight: 103.2 kg 99.6 kg    Exam:  General:  Appears calm and comfortable and is in NAD, up in bedside chair Eyes:  EOMI, normal lids, iris ENT:  grossly normal hearing, lips & tongue, mmm; handlebar mustache Cardiovascular:  RRR, no m/r/g. No LE edema.  Respiratory:   Audible wheezing.  Normal respiratory effort. Abdomen:  soft, NT, ND Skin:  no rash or induration seen on limited exam Musculoskeletal:  generalized weakness, no bony abnormality Psychiatric:  mildly confused mood and affect, speech fluent and mildly inappropriate, AOx1-2 Neurologic:  CN 2-12 grossly intact, moves all extremities in coordinated fashion  Data Reviewed: I have reviewed the patient's lab results since admission.  Pertinent labs for today include:   Na++ 133 Glucose 212 BUN 38/Creatinine 3.16/GFR 19 - stable WBC 10.4 Hgb 8.4, stable     Family Communication: None present  Disposition: Status is: Inpatient Remains inpatient appropriate because: needs placement     Time spent: 50 minutes  Unresulted Labs (From admission, onward)     Start     Ordered   12/02/23 0500  CBC  Daily,   R     Question:  Specimen collection method  Answer:  IV Team=IV Team collect   12/01/23 2206   12/02/23 0500  Basic metabolic panel with GFR  Daily,   R     Question:  Specimen collection method  Answer:  IV Team=IV Team collect   12/01/23 2206             Author: Lorita Rosa, MD 12/03/2023 2:49 PM  For on call review [email or web address].

## 2023-12-03 NOTE — Progress Notes (Signed)
 [facility]  ROUNDING NOTE   Subjective:   [name] was admitted to [facility] on 11/25/2023 for Upper GI bleed [K92.2] Pancytopenia (HCC) [Z61.096] Gastrointestinal hemorrhage, unspecified gastrointestinal hemorrhage type [K92.2]  Patient sitting up in chair. Slow to respond but alert.  Creatinine 3.16   Objective:  Vital signs in last 24 hours:  Temp:  [97.8 F (36.6 C)-99 F (37.2 C)] 97.8 F (36.6 C) (04/26 1120) Pulse Rate:  [57-78] 57 (04/26 1120) Resp:  [16-20] 16 (04/26 1120) BP: (122-167)/(52-85) 141/63 (04/26 1120) SpO2:  [96 %-100 %] 98 % (04/26 1120)  Weight change:  Filed Weights   11/25/23 2143 12/01/23 0502  Weight: 103.2 kg 99.6 kg    Intake/Output: I/O last 3 completed shifts: In: 237 [P.O.:237] Out: 1300 [Urine:1300]   Intake/Output this shift:  No intake/output data recorded.  Physical Exam: General: NAD,   Head: Normocephalic, atraumatic. Moist oral mucosal membranes  Eyes: Anicteric, PERRL  Neck: Supple, trachea midline  Lungs:  Diminished to auscultation  Heart: Regular rate and rhythm  Abdomen:  Soft, nontender,   Extremities:  Trace peripheral edema.  Neurologic: Nonfocal, moving all four extremities  Skin: No lesions  Access: None    Basic Metabolic Panel: Recent Labs  Lab 11/28/23 0518 11/29/23 0456 11/30/23 0452 12/02/23 0410 12/03/23 0538  NA 136 136 137 132* 133*  K 4.5 4.1 4.4 4.1 3.9  CL 110 111 112* 108 106  CO2 19* 17* 21* 19* 20*  GLUCOSE 143* 146* 147* 199* 212*  BUN 47* 43* 42* 44* 38*  CREATININE 3.16* 2.92* 3.24* 3.07* 3.16*  CALCIUM  8.6* 8.8* 9.0 8.5* 8.6*    Liver Function Tests: No results for input(s): "AST", "ALT", "ALKPHOS", "BILITOT", "PROT", "ALBUMIN" in the last 168 hours. No results for input(s): "LIPASE", "AMYLASE" in the last 168 hours. No results for input(s): "AMMONIA" in the last 168 hours.  CBC: Recent Labs  Lab 11/27/23 0547 11/28/23 0518 11/29/23 0456  11/30/23 0452 12/01/23 0350 12/02/23 0410 12/03/23 0538  WBC 1.4* 3.6* 16.3* 32.4* 28.3* 15.2*  15.3* 10.4  NEUTROABS 0.1* 1.0*  --   --   --  9.0*  --   HGB 7.4* 8.3* 8.5* 8.5* 8.1* 7.4*  7.4* 8.4*  HCT 21.1* 24.1* 24.9* 25.6* 24.6* 22.4*  22.1* 23.6*  MCV 89.4 92.7 91.9 93.8 94.3 97.8  94.0 93.7  PLT 43* 56* 87* 123* 138* 146*  150 174    Cardiac Enzymes: No results for input(s): "CKTOTAL", "CKMB", "CKMBINDEX", "TROPONINI" in the last 168 hours.  BNP: Invalid input(s): "POCBNP"  CBG: Recent Labs  Lab 11/30/23 1149 11/30/23 1701 11/30/23 2014 12/01/23 0144 12/02/23 0418  GLUCAP 223* 194* 204* 210* 195*    Microbiology: Results for orders placed or performed during the hospital encounter of 11/25/23  Respiratory (~20 pathogens) panel by PCR     Status: None   Collection Time: 12/02/23 11:00 AM   Specimen: Nasopharyngeal Swab; Respiratory  Result Value Ref Range Status   Adenovirus NOT DETECTED NOT DETECTED Final   Coronavirus 229E NOT DETECTED NOT DETECTED Final    Comment: (NOTE) The Coronavirus on the Respiratory Panel, DOES NOT test for the novel  Coronavirus (2019 nCoV)    Coronavirus HKU1 NOT DETECTED NOT DETECTED Final   Coronavirus NL63 NOT DETECTED NOT DETECTED Final   Coronavirus OC43 NOT DETECTED NOT DETECTED Final   Metapneumovirus NOT DETECTED NOT DETECTED Final   Rhinovirus / Enterovirus NOT DETECTED NOT DETECTED Final   Influenza A  NOT DETECTED NOT DETECTED Final   Influenza B NOT DETECTED NOT DETECTED Final   Parainfluenza Virus 1 NOT DETECTED NOT DETECTED Final   Parainfluenza Virus 2 NOT DETECTED NOT DETECTED Final   Parainfluenza Virus 3 NOT DETECTED NOT DETECTED Final   Parainfluenza Virus 4 NOT DETECTED NOT DETECTED Final   Respiratory Syncytial Virus NOT DETECTED NOT DETECTED Final   Bordetella pertussis NOT DETECTED NOT DETECTED Final   Bordetella Parapertussis NOT DETECTED NOT DETECTED Final   Chlamydophila pneumoniae NOT DETECTED  NOT DETECTED Final   Mycoplasma pneumoniae NOT DETECTED NOT DETECTED Final    Comment: Performed at [facility], [address] [place], [place] [address]    Coagulation Studies: No results for input(s): "LABPROT", "INR" in the last 72 hours.  Urinalysis: No results for input(s): "COLORURINE", "LABSPEC", "PHURINE", "GLUCOSEU", "HGBUR", "BILIRUBINUR", "KETONESUR", "PROTEINUR", "UROBILINOGEN", "NITRITE", "LEUKOCYTESUR" in the last 72 hours.  Invalid input(s): "APPERANCEUR"    Imaging: No results found.   Medications:    sodium chloride  200 mL/hr (11/28/23 0928)    amLODipine   5 mg Oral Daily   carvedilol   3.125 mg Oral BID WC   feeding supplement  1 Container Oral TID BM   folic acid   1 mg Oral Daily   guaiFENesin   1,200 mg Oral BID   isosorbide  mononitrate  60 mg Oral QAC lunch   pantoprazole   40 mg Oral BID AC   rosuvastatin   10 mg Oral Daily   albuterol , dextrose , guaiFENesin -dextromethorphan , senna-docusate  Assessment/ Plan:  [name] is a 78 y.o.  male with diabetes mellitus type II, hypertension, glaucoma, hyperlipidemia, T cell lymphoma, and rheumatoid arthritis who is admitted to [facility] on 11/25/2023 for Upper GI bleed [K92.2] Pancytopenia (HCC) [U04.540] Gastrointestinal hemorrhage, unspecified gastrointestinal hemorrhage type [K92.2]   Acute Kidney Injury on Chronic Kidney Disease stage IV: baseline creatinine of 3.8, GFR of 15 on 11/15/23.  Creatinine 2.6 on 09/23/2023. - holding losartan , dapagliflozin, and spironolactone .  - Creatinine remained stable, may indicate new baseline. -No acute indication for dialysis, continuing to monitor renal indices.  - Will require a follow-up at discharge with Dr. Lamount Pimple. Lab Results  Component Value Date   CREATININE 3.16 (H) 12/03/2023   CREATININE 3.07 (H) 12/02/2023   CREATININE 3.24 (H) 11/30/2023      Hypertension with chronic kidney disease: Maintain the patient on amlodipine  and carvedilol . Blood  pressure 141/63   Diabetes mellitus type II with chronic kidney disease:  - holding glipizide  - Primary team to continue management.   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 Status post mutliple transfusions.  - appreciate hematology and GI input - holding methotrexate . - White count and platelets slowly improving. Hemoglobin & Hematocrit     Component Value Date/Time   HGB 8.4 (L) 12/03/2023 0538   HGB 8.5 (L) 10/30/2011 0511   HCT 23.6 (L) 12/03/2023 0538   HCT 33.8 (L) 10/18/2011 0958     LOS: 8 Baylon Santelli P Kayloni Rocco 4/26/20251:09 PM

## 2023-12-03 NOTE — TOC Progression Note (Signed)
 Transition of Care [facility]) - Progression Note    Patient Details  Name: Daniel Kline MRN: [id number] Date of Birth: 1945-11-16  Transition of Care [facility]) CM/SW Contact  Areta Beer, RN Phone Number: 12/03/2023, 3:28 PM  Clinical Narrative:  4/26: First choice of STR/SNF, [facility], is "considering" in Hub submitted around noon yesterday.    Katheryn Pandy MSN RN CM  RN Case Manager [place]  Transitions of Care Direct Dial: [phone] (Weekends Only) [facility] Main Office Phone: [phone] [facility] Fax: [phone] [email or web address]     Expected Discharge Plan: Home w Home Health Services Barriers to Discharge: Continued Medical Work up  Expected Discharge Plan and Services       Living arrangements for the past 2 months: Single Family Home                                       Social Determinants of Health (SDOH) Interventions SDOH Screenings   Food Insecurity: No Food Insecurity (11/26/2023)  Housing: Low Risk  (11/26/2023)  Transportation Needs: No Transportation Needs (11/26/2023)  Utilities: Not At Risk (11/26/2023)  Depression (PHQ2-9): Low Risk  (10/31/2023)  Financial Resource Strain: Low Risk  (07/20/2017)  Social Connections: Moderately Isolated (11/26/2023)  Tobacco Use: Medium Risk (11/25/2023)    Readmission Risk Interventions    11/30/2023    2:01 PM 09/06/2022    3:47 PM 01/27/2022   12:27 PM  Readmission Risk Prevention Plan  Transportation Screening Complete Complete Complete  PCP or Specialist Appt within 3-5 Days   Complete  HRI or Home Care Consult   Complete  Social Work Consult for Recovery Care Planning/Counseling   Complete  Palliative Care Screening   Complete  Medication Review Oceanographer) Complete Complete Complete  PCP or Specialist appointment within 3-5 days of discharge Complete    HRI or Home Care Consult Complete    SW Recovery Care/Counseling Consult Complete Complete   Palliative Care Screening Not Applicable  Not Applicable   Skilled Nursing Facility Not Applicable

## 2023-12-04 DIAGNOSIS — K922 Gastrointestinal hemorrhage, unspecified: Secondary | ICD-10-CM | POA: Diagnosis not present

## 2023-12-04 LAB — CBC
HCT: 20.9 % — ABNORMAL LOW (ref 39.0–52.0)
HCT: 22.7 % — ABNORMAL LOW (ref 39.0–52.0)
Hemoglobin: 7.1 g/dL — ABNORMAL LOW (ref 13.0–17.0)
Hemoglobin: 7.8 g/dL — ABNORMAL LOW (ref 13.0–17.0)
MCH: 31.3 pg (ref 26.0–34.0)
MCH: 32.1 pg (ref 26.0–34.0)
MCHC: 34 g/dL (ref 30.0–36.0)
MCHC: 34.4 g/dL (ref 30.0–36.0)
MCV: 92.1 fL (ref 80.0–100.0)
MCV: 93.4 fL (ref 80.0–100.0)
Platelets: 189 10*3/uL (ref 150–400)
Platelets: 196 10*3/uL (ref 150–400)
RBC: 2.27 MIL/uL — ABNORMAL LOW (ref 4.22–5.81)
RBC: 2.43 MIL/uL — ABNORMAL LOW (ref 4.22–5.81)
RDW: 17.2 % — ABNORMAL HIGH (ref 11.5–15.5)
RDW: 17.2 % — ABNORMAL HIGH (ref 11.5–15.5)
WBC: 9.7 10*3/uL (ref 4.0–10.5)
WBC: 9.6 10*3/uL (ref 4.0–10.5)
nRBC: 1.3 % — ABNORMAL HIGH (ref 0.0–0.2)
nRBC: 2.2 % — ABNORMAL HIGH (ref 0.0–0.2)

## 2023-12-04 LAB — BASIC METABOLIC PANEL WITH GFR
Anion gap: 4 — ABNORMAL LOW (ref 5–15)
BUN: 41 mg/dL — ABNORMAL HIGH (ref 8–23)
CO2: 22 mmol/L (ref 22–32)
Calcium: 8.4 mg/dL — ABNORMAL LOW (ref 8.9–10.3)
Chloride: 109 mmol/L (ref 98–111)
Creatinine, Ser: 3.31 mg/dL — ABNORMAL HIGH (ref 0.61–1.24)
GFR, Estimated: 18 mL/min — ABNORMAL LOW (ref >60)
Glucose, Bld: 169 mg/dL — ABNORMAL HIGH (ref 70–99)
Potassium: 4.1 mmol/L (ref 3.5–5.1)
Sodium: 135 mmol/L (ref 135–145)

## 2023-12-04 NOTE — Progress Notes (Signed)
 [facility]  ROUNDING NOTE   Subjective:  Patient in bed resting. No acute events overnight. No complaints.   Objective:  Vital signs in last 24 hours:  Temp:  [97.8 F (36.6 C)-99.5 F (37.5 C)] 97.8 F (36.6 C) (04/27 1220) Pulse Rate:  [59-92] 60 (04/27 1220) Resp:  [16-20] 16 (04/27 1220) BP: (115-148)/(51-91) 127/51 (04/27 1220) SpO2:  [93 %-98 %] 98 % (04/27 1220)  Weight change:  Filed Weights   11/25/23 2143 12/01/23 0502  Weight: 103.2 kg 99.6 kg    Intake/Output: I/O last 3 completed shifts: In: -  Out: 1500 [Urine:1500]   Intake/Output this shift:  No intake/output data recorded.  Physical Exam: General: NAD,   Head: Normocephalic, atraumatic. Moist oral mucosal membranes  Eyes: Anicteric, PERRL  Neck: Supple, trachea midline  Lungs:  Diminished to auscultation  Heart: Regular rate and rhythm  Abdomen:  Soft, nontender,   Extremities:  No peripheral edema.  Neurologic: Nonfocal, moving all four extremities  Skin: No lesions  Access: none    Basic Metabolic Panel: Recent Labs  Lab 11/29/23 0456 11/30/23 0452 12/02/23 0410 12/03/23 0538 12/04/23 0501  NA 136 137 132* 133* 135  K 4.1 4.4 4.1 3.9 4.1  CL 111 112* 108 106 109  CO2 17* 21* 19* 20* 22  GLUCOSE 146* 147* 199* 212* 169*  BUN 43* 42* 44* 38* 41*  CREATININE 2.92* 3.24* 3.07* 3.16* 3.31*  CALCIUM  8.8* 9.0 8.5* 8.6* 8.4*    Liver Function Tests: No results for input(s): "AST", "ALT", "ALKPHOS", "BILITOT", "PROT", "ALBUMIN" in the last 168 hours. No results for input(s): "LIPASE", "AMYLASE" in the last 168 hours. No results for input(s): "AMMONIA" in the last 168 hours.  CBC: Recent Labs  Lab 11/28/23 0518 11/29/23 0456 12/01/23 0350 12/02/23 0410 12/03/23 0538 12/04/23 0501 12/04/23 0807  WBC 3.6*   < > 28.3* 15.2*  15.3* 10.4 9.7 9.6  NEUTROABS 1.0*  --   --  9.0*  --   --   --   HGB 8.3*   < > 8.1* 7.4*  7.4* 8.4* 7.1* 7.8*  HCT 24.1*   < > 24.6* 22.4*   22.1* 23.6* 20.9* 22.7*  MCV 92.7   < > 94.3 97.8  94.0 93.7 92.1 93.4  PLT 56*   < > 138* 146*  150 174 189 196   < > = values in this interval not displayed.    Cardiac Enzymes: No results for input(s): "CKTOTAL", "CKMB", "CKMBINDEX", "TROPONINI" in the last 168 hours.  BNP: Invalid input(s): "POCBNP"  CBG: Recent Labs  Lab 11/30/23 1149 11/30/23 1701 11/30/23 2014 12/01/23 0144 12/02/23 0418  GLUCAP 223* 194* 204* 210* 195*    Microbiology: Results for orders placed or performed during the hospital encounter of 11/25/23  Respiratory (~20 pathogens) panel by PCR     Status: None   Collection Time: 12/02/23 11:00 AM   Specimen: Nasopharyngeal Swab; Respiratory  Result Value Ref Range Status   Adenovirus NOT DETECTED NOT DETECTED Final   Coronavirus 229E NOT DETECTED NOT DETECTED Final    Comment: (NOTE) The Coronavirus on the Respiratory Panel, DOES NOT test for the novel  Coronavirus (2019 nCoV)    Coronavirus HKU1 NOT DETECTED NOT DETECTED Final   Coronavirus NL63 NOT DETECTED NOT DETECTED Final   Coronavirus OC43 NOT DETECTED NOT DETECTED Final   Metapneumovirus NOT DETECTED NOT DETECTED Final   Rhinovirus / Enterovirus NOT DETECTED NOT DETECTED Final   Influenza A NOT DETECTED  NOT DETECTED Final   Influenza B NOT DETECTED NOT DETECTED Final   Parainfluenza Virus 1 NOT DETECTED NOT DETECTED Final   Parainfluenza Virus 2 NOT DETECTED NOT DETECTED Final   Parainfluenza Virus 3 NOT DETECTED NOT DETECTED Final   Parainfluenza Virus 4 NOT DETECTED NOT DETECTED Final   Respiratory Syncytial Virus NOT DETECTED NOT DETECTED Final   Bordetella pertussis NOT DETECTED NOT DETECTED Final   Bordetella Parapertussis NOT DETECTED NOT DETECTED Final   Chlamydophila pneumoniae NOT DETECTED NOT DETECTED Final   Mycoplasma pneumoniae NOT DETECTED NOT DETECTED Final    Comment: Performed at [facility], [address] [place], [place] [address]    Coagulation  Studies: No results for input(s): "LABPROT", "INR" in the last 72 hours.  Urinalysis: No results for input(s): "COLORURINE", "LABSPEC", "PHURINE", "GLUCOSEU", "HGBUR", "BILIRUBINUR", "KETONESUR", "PROTEINUR", "UROBILINOGEN", "NITRITE", "LEUKOCYTESUR" in the last 72 hours.  Invalid input(s): "APPERANCEUR"    Imaging: No results found.   Medications:    sodium chloride  200 mL/hr (11/28/23 0928)    amLODipine   5 mg Oral Daily   carvedilol   3.125 mg Oral BID WC   feeding supplement  1 Container Oral TID BM   folic acid   1 mg Oral Daily   guaiFENesin   1,200 mg Oral BID   isosorbide  mononitrate  60 mg Oral QAC lunch   pantoprazole   40 mg Oral BID AC   rosuvastatin   10 mg Oral Daily   albuterol , dextrose , guaiFENesin -dextromethorphan , senna-docusate  Assessment/ Plan:  [name] is a 78 y.o.  male  with diabetes mellitus type II, hypertension, glaucoma, hyperlipidemia, T cell lymphoma, and rheumatoid arthritis who is admitted to [facility] on 11/25/2023 for Upper GI bleed [K92.2] Pancytopenia (HCC) [O13.086] Gastrointestinal hemorrhage, unspecified gastrointestinal hemorrhage type [K92.2]   Acute Kidney Injury on Chronic Kidney Disease stage IV: baseline creatinine of 3.8, GFR of 15 on 11/15/23.  Creatinine 2.6 on 09/23/2023. - holding losartan , dapagliflozin, and spironolactone .  - Creatinine remained stable, may indicate new baseline. -No acute indication for dialysis, continuing to monitor renal indices.  - Will require a follow-up at discharge with Dr. Lamount Pimple. Lab Results  Component Value Date   CREATININE 3.31 (H) 12/04/2023   CREATININE 3.16 (H) 12/03/2023   CREATININE 3.07 (H) 12/02/2023      Hypertension with chronic kidney disease: Maintain the patient on amlodipine  and carvedilol . Blood pressure 127/51   Diabetes mellitus type II with chronic kidney disease:  - holding glipizide  - Primary team to continue management.   Anemia and pancytopenia with chronic  kidney disease: concern for underlying GI bleed. Status post mutliple transfusions.  - appreciate hematology and GI input - holding methotrexate . - White count and platelets slowly improving. Hemoglobin & Hematocrit     Component Value Date/Time   HGB 7.8 (L) 12/04/2023 0807   HGB 8.5 (L) 10/30/2011 0511   HCT 22.7 (L) 12/04/2023 0807   HCT 33.8 (L) 10/18/2011 0958       LOS: 9 Gurinder Toral P Shelia Kingsberry 4/27/20251:00 PM

## 2023-12-04 NOTE — Plan of Care (Signed)

## 2023-12-04 NOTE — Plan of Care (Signed)
  Problem: Clinical Measurements: Goal: Ability to maintain clinical measurements within normal limits will improve Outcome: Progressing Goal: Will remain free from infection Outcome: Progressing Goal: Respiratory complications will improve Outcome: Progressing Goal: Cardiovascular complication will be avoided Outcome: Progressing   Problem: Safety: Goal: Ability to remain free from injury will improve Outcome: Progressing   Problem: Elimination: Goal: Will not experience complications related to urinary retention Outcome: Progressing

## 2023-12-04 NOTE — Progress Notes (Signed)
 Progress Note   Patient: Daniel Kline EAV:[id number] DOB: [birth year] DOA: 11/25/2023     9 DOS: the patient was seen and examined on 12/04/2023   Brief hospital course: 78yo with h/o cutaneous T-cell lymphoma, rheumatoid arthritis on methotrexate , CKD stage IV, hypertension, neuropathy, non-insulin -dependent diabetes mellitus, hypertension, and hyperlipidemia who presented on 4/18 with melena and falls.  He required 1 unit of PRBC; EGD showed changes suspicious for Barrett's esophagus, non-bleeding gastric ulcer, and gastritis/duodenitis.  He is recommended for outpatient EGD with biopsy.  Also with pancytopenia in the setting of methotrexate  use; he was treated with Filgrastim  x 3 days with improvement.  He is stable for SNF rehab.  Assessment and Plan:  Upper GI bleed Acute blood loss anemia S/p 1 unit of PRBC, 2 units of platelets transfused GI consulted EGD showed suspicious changes for barrett's, non bleeding gastric ulcer, gastritis, duodenitis He will need outpatient EGD for biopsy which will be set up with Dr. Corky Diener Change to Protonix  40 mg oral twice daily Monitor H/H and transfuse if Hb <7  Hgb dropped from 8.4 to 7.1 this AM but repeat was 7.8 so appears to be lab error, recheck in AM   Acute on CKD stage 4 Etiology is likely multifactorial including secondary to severe anemia and poor p.o. intake Hold Losartan , Dapaglifozin, and spironolactone  Nephrology consulted - no HD needed Kidney function stable Avoid nephrotoxic agents Outpatient nephrology f/u   Acute metabolic encephalopathy- Delirium in the setting of pancytopenia, electrolyte abnormalities Avoid sedatives, opiates Delirium precautions Wife mentioned that after his twin brother's death he has been having mental health issues, more sleepy, lethargic, on and off confused   Type 2 diabetes mellitus with chronic kidney disease, without long-term current use of insulin   A1c 6.2, well controlled Hold  glipizide  Hold off insulin  sliding scale, as he is eating poorly   Essential hypertension Norvasc , Coreg , imdur  resumed Hydralazine  IV as needed ordered   GERD (gastroesophageal reflux disease) Protonix  40 mg oral twice daily   Hyponatremia Stable, not clinically significant   Pancytopenia In the setting of methotrexate  use Appears to be stable at this time Continue to hold methotrexate  Given Filgrastim  therapy x 3 days Outpatient hematology f/u   Rheumatoid arthritis Anti-cyclic citrullinated peptide antibody positive Methotrexate  on hold due to pancytopenia Outpatient rheumatology follow up   Cutaneous T-cell lymphoma  Continue outpatient follow-up with dermatologist as appropriate   Class 2 Obesity Body mass index is 35.44 kg/m.Aaron Aas  Weight loss should be encouraged Outpatient PCP/bariatric medicine f/u encouraged Significantly low or high BMI is associated with higher medical risk including morbidity and mortality    Debility Due to chronic illness PT/ OT recommends SNF placement TOC working on placement.     Consultants: Oncology GI Nephrology PT OT [facility] team   Procedures: EGD 4/21   Antibiotics: None  30 Day Unplanned Readmission Risk Score    Flowsheet Row ED to Hosp-Admission (Current) from 11/25/2023 in [facility] PCU  30 Day Unplanned Readmission Risk Score (%) 38.53 Filed at 12/04/2023 0401       This score is the patient's risk of an unplanned readmission within 30 days of being discharged (0 -100%). The score is based on dignosis, age, lab data, medications, orders, and past utilization.   Low:  0-14.9   Medium: 15-21.9   High: 22-29.9   Extreme: 30 and above           Subjective: Reports feeling some better today, no new concerns.  Objective: Vitals:   12/04/23 1006 12/04/23 1220  BP:  (!) 127/51  Pulse:  60  Resp:  16  Temp: 98.1 F (36.7 C) 97.8 F (36.6 C)  SpO2:  98%    Intake/Output Summary (Last  24 hours) at 12/04/2023 1420 Last data filed at 12/04/2023 0500 Gross per 24 hour  Intake --  Output 900 ml  Net -900 ml   Filed Weights   11/25/23 2143 12/01/23 0502  Weight: 103.2 kg 99.6 kg    Exam:  General:  Appears calm and comfortable and is in NAD Eyes:  EOMI, normal lids, iris ENT:  grossly normal hearing, lips & tongue, mmm; handlebar mustache Cardiovascular:  RRR, no m/r/g. No LE edema.  Respiratory:   Audible wheezing.  Normal respiratory effort. Abdomen:  soft, NT, ND Skin:  no rash or induration seen on limited exam Musculoskeletal:  generalized weakness, no bony abnormality Psychiatric:  pleasant mood and affect, speech fluent and mildly inappropriate, AOx1-2 Neurologic:  CN 2-12 grossly intact, moves all extremities in coordinated fashion  Data Reviewed: I have reviewed the patient's lab results since admission.  Pertinent labs for today include:   Glucose 169 BUN 41/Creatinine 3.31/GFR 18, stable WBC 9.7 Hgb 7.1, down from 8.4; recheck is 7.8     Family Communication: None present  Disposition: Status is: Inpatient Remains inpatient appropriate because: awaiting placement     Time spent: 35 minutes  Unresulted Labs (From admission, onward)     Start     Ordered   12/05/23 0500  CBC with Differential/Platelet  Tomorrow morning,   R       Question:  Specimen collection method  Answer:  IV Team=IV Team collect   12/04/23 1420   12/05/23 0500  Basic metabolic panel with GFR  Tomorrow morning,   R       Question:  Specimen collection method  Answer:  IV Team=IV Team collect   12/04/23 1420             Author: Lorita Rosa, MD 12/04/2023 2:20 PM  For on call review [email or web address].

## 2023-12-05 ENCOUNTER — Telehealth: Payer: Self-pay | Admitting: Internal Medicine

## 2023-12-05 DIAGNOSIS — K922 Gastrointestinal hemorrhage, unspecified: Secondary | ICD-10-CM | POA: Diagnosis not present

## 2023-12-05 LAB — CBC WITH DIFFERENTIAL/PLATELET
Abs Immature Granulocytes: 0.41 10*3/uL — ABNORMAL HIGH (ref 0.00–0.07)
Basophils Absolute: 0 10*3/uL (ref 0.0–0.1)
Basophils Relative: 1 %
Eosinophils Absolute: 0.1 10*3/uL (ref 0.0–0.5)
Eosinophils Relative: 1 %
HCT: 23.4 % — ABNORMAL LOW (ref 39.0–52.0)
Hemoglobin: 7.9 g/dL — ABNORMAL LOW (ref 13.0–17.0)
Immature Granulocytes: 5 %
Lymphocytes Relative: 18 %
Lymphs Abs: 1.6 10*3/uL (ref 0.7–4.0)
MCH: 31.9 pg (ref 26.0–34.0)
MCHC: 33.8 g/dL (ref 30.0–36.0)
MCV: 94.4 fL (ref 80.0–100.0)
Monocytes Absolute: 1.4 10*3/uL — ABNORMAL HIGH (ref 0.1–1.0)
Monocytes Relative: 16 %
Neutro Abs: 5.1 10*3/uL (ref 1.7–7.7)
Neutrophils Relative %: 59 %
Platelets: 232 10*3/uL (ref 150–400)
RBC: 2.48 MIL/uL — ABNORMAL LOW (ref 4.22–5.81)
RDW: 17.2 % — ABNORMAL HIGH (ref 11.5–15.5)
Smear Review: NORMAL
WBC: 8.6 10*3/uL (ref 4.0–10.5)
nRBC: 4.1 % — ABNORMAL HIGH (ref 0.0–0.2)

## 2023-12-05 LAB — BASIC METABOLIC PANEL WITH GFR
Anion gap: 5 (ref 5–15)
BUN: 36 mg/dL — ABNORMAL HIGH (ref 8–23)
CO2: 21 mmol/L — ABNORMAL LOW (ref 22–32)
Calcium: 8.4 mg/dL — ABNORMAL LOW (ref 8.9–10.3)
Chloride: 109 mmol/L (ref 98–111)
Creatinine, Ser: 3.04 mg/dL — ABNORMAL HIGH (ref 0.61–1.24)
GFR, Estimated: 20 mL/min — ABNORMAL LOW (ref >60)
Glucose, Bld: 187 mg/dL — ABNORMAL HIGH (ref 70–99)
Potassium: 4.1 mmol/L (ref 3.5–5.1)
Sodium: 135 mmol/L (ref 135–145)

## 2023-12-05 NOTE — Progress Notes (Signed)
 Physical Therapy Treatment Patient Details Name: Daniel Kline MRN: [id number] DOB: [birth year] Today's Date: 12/05/2023   History of Present Illness Pt is a 78 year old male with history of T-cell lymphoma, rheumatoid arthritis on methotrexate , CKD stage IV, hypertension, neuropathy, non-insulin -dependent diabetes mellitus, hypertension, hyperlipidemia, and per spouse polio who presented to the emergency department for chief concerns of melena stool and falling at home.  MD assessment includes: upper GI bleed, acute blood loss anemia, acute on CKD stage 4, acute metabolic encephalopathy, and hyponatremia.    PT Comments  Pt remained pleasantly confused but followed commands with grossly less cuing than during prior sessions and made good progress towards functional goals.  Pt required mod A with bed mobility tasks but only minimal assist with transfers and gait.  Pt did require frequent cuing to initiate movements and for general sequencing.  Pt reported no adverse symptoms during the session with SpO2 and HR WNL throughout on room air.  Nursing notified that pt was found on room air with SpO2 remaining in the mid 90s throughout the session, ok to leave pt on room air per nursing.  Pt will benefit from continued PT services upon discharge to safely address deficits listed in patient problem list for decreased caregiver assistance and eventual return to PLOF.      If plan is discharge home, recommend the following: A lot of help with walking and/or transfers;A little help with bathing/dressing/bathroom;Assistance with cooking/housework;Direct supervision/assist for medications management;Assist for transportation;Help with stairs or ramp for entrance;Supervision due to cognitive status   Can travel by private vehicle     Yes  Equipment Recommendations  None recommended by PT    Recommendations for Other Services       Precautions / Restrictions Precautions Precautions: Fall Recall of  Precautions/Restrictions: Impaired Restrictions Weight Bearing Restrictions Per Provider Order: No     Mobility  Bed Mobility Overal bed mobility: Needs Assistance Bed Mobility: Supine to Sit     Supine to sit: Mod assist     General bed mobility comments: Cues required for initiation of sup to sit with mod A for BLE and trunk control    Transfers Overall transfer level: Needs assistance Equipment used: Right platform walker Transfers: Sit to/from Stand Sit to Stand: Min assist           General transfer comment: Mod multi-modal cues for sequencing and min A to come to standing from multiple height surfaces    Ambulation/Gait Ambulation/Gait assistance: Min assist Gait Distance (Feet): 12 Feet Assistive device: Rolling walker (2 wheels) Gait Pattern/deviations: Step-through pattern, Decreased step length - right, Decreased step length - left, Trunk flexed Gait velocity: decreased     General Gait Details: Pt able to perform three bouts of amb with self-selected max of around 12 feet each with close CGA for pt safety and occasional min A to guide the RW   Stairs             Wheelchair Mobility     Tilt Bed    Modified Rankin (Stroke Patients Only)       Balance Overall balance assessment: Needs assistance, History of Falls Sitting-balance support: Feet supported, Single extremity supported Sitting balance-Leahy Scale: Good     Standing balance support: Bilateral upper extremity supported, During functional activity, Reliant on assistive device for balance Standing balance-Leahy Scale: Fair  Communication Communication Communication: No apparent difficulties  Cognition Arousal: Alert Behavior During Therapy: WFL for tasks assessed/performed   PT - Cognitive impairments: Orientation, Safety/Judgement, Memory                       PT - Cognition Comments: confused, but pleasant Following  commands: Impaired Following commands impaired: Follows one step commands with increased time, Follows one step commands inconsistently    Cueing Cueing Techniques: Verbal cues, Tactile cues, Visual cues  Exercises Other Exercises Other Exercises: Sit to/from stand transfer training from various height surfaces    General Comments        Pertinent Vitals/Pain Pain Assessment Pain Assessment: No/denies pain    Home Living                          Prior Function            PT Goals (current goals can now be found in the care plan section) Progress towards PT goals: Progressing toward goals    Frequency    Min 2X/week      PT Plan      Co-evaluation              AM-PAC PT "6 Clicks" Mobility   Outcome Measure  Help needed turning from your back to your side while in a flat bed without using bedrails?: A Little Help needed moving from lying on your back to sitting on the side of a flat bed without using bedrails?: A Little Help needed moving to and from a bed to a chair (including a wheelchair)?: A Little Help needed standing up from a chair using your arms (e.g., wheelchair or bedside chair)?: A Little Help needed to walk in hospital room?: A Little Help needed climbing 3-5 steps with a railing? : A Lot 6 Click Score: 17    End of Session Equipment Utilized During Treatment: Gait belt Activity Tolerance: Patient tolerated treatment well Patient left: in chair;with call bell/phone within reach;with chair alarm set Nurse Communication: Mobility status;Other (comment) (Pt found in supine with [place] removed and on bed next to pt, SpO2 94-95% on room air, ok to leave pt on RA per nsg) PT Visit Diagnosis: History of falling (Z91.81);Unsteadiness on feet (R26.81);Difficulty in walking, not elsewhere classified (R26.2);Muscle weakness (generalized) (M62.81)     Time: 4742-5956 PT Time Calculation (min) (ACUTE ONLY): 23 min  Charges:    $Gait Training:  8-22 mins $Therapeutic Activity: 8-22 mins PT General Charges $$ ACUTE PT VISIT: 1 Visit                     D. Scott Caidon Foti PT, DPT 12/05/23, 4:10 PM

## 2023-12-05 NOTE — Progress Notes (Signed)
 Progress Note   Patient: Daniel Kline ION:[id number] DOB: 1946-05-05 DOA: 11/25/2023     10 DOS: the patient was seen and examined on 12/05/2023   Brief hospital course: 78yo with h/o cutaneous T-cell lymphoma, rheumatoid arthritis on methotrexate , CKD stage IV, hypertension, neuropathy, non-insulin -dependent diabetes mellitus, hypertension, and hyperlipidemia who presented on 4/18 with melena and falls.  He required 1 unit of PRBC; EGD showed changes suspicious for Barrett's esophagus, non-bleeding gastric ulcer, and gastritis/duodenitis.  He is recommended for outpatient EGD with biopsy.  Also with pancytopenia in the setting of methotrexate  use; he was treated with Filgrastim  x 3 days with improvement.  He is stable for SNF rehab.  Assessment and Plan:  Upper GI bleed Acute blood loss anemia S/p 1 unit of PRBC, 2 units of platelets transfused GI consulted EGD showed suspicious changes for barrett's, non bleeding gastric ulcer, gastritis, duodenitis He will need outpatient EGD for biopsy which will be set up with Dr. Corky Diener Changed to Protonix  40 mg oral twice daily  Hgb stable   Acute on CKD stage 4 Etiology is likely multifactorial including secondary to severe anemia and poor p.o. intake Hold Losartan , Dapaglifozin, and spironolactone  Nephrology consulted - no HD needed Kidney function stable Avoid nephrotoxic agents Outpatient nephrology f/u   Acute metabolic encephalopathy- Delirium in the setting of pancytopenia, electrolyte abnormalities Avoid sedatives, opiates Delirium precautions Wife mentioned that after his twin brother's death he has been having mental health issues, more sleepy, lethargic, on and off confused Possible underlying MCI   Type 2 diabetes mellitus with chronic kidney disease, without long-term current use of insulin   A1c 6.2, well controlled Hold glipizide  Hold off insulin  sliding scale, as he is eating poorly   Essential hypertension Norvasc ,  Coreg , imdur  resumed Hydralazine  IV as needed ordered   GERD (gastroesophageal reflux disease) Protonix  40 mg oral twice daily   Hyponatremia Stable, not clinically significant   Pancytopenia In the setting of methotrexate  use Appears to be stable at this time Continue to hold methotrexate  Given Filgrastim  therapy x 3 days Outpatient hematology f/u   Rheumatoid arthritis Anti-cyclic citrullinated peptide antibody positive Methotrexate  on hold due to pancytopenia Outpatient rheumatology follow up   Cutaneous T-cell lymphoma  Continue outpatient follow-up with dermatologist as appropriate   Class 2 Obesity Body mass index is 35.44 kg/m.Aaron Aas  Weight loss should be encouraged Outpatient PCP/bariatric medicine f/u encouraged Significantly low or high BMI is associated with higher medical risk including morbidity and mortality    Debility Due to chronic illness PT/ OT recommends SNF placement TOC working on placement.     Consultants: Oncology GI Nephrology PT OT [facility] team   Procedures: EGD 4/21   Antibiotics: None      30 Day Unplanned Readmission Risk Score    Flowsheet Row ED to Hosp-Admission (Current) from 11/25/2023 in [facility]U  30 Day Unplanned Readmission Risk Score (%) 39.09 Filed at 12/05/2023 1200       This score is the patient's risk of an unplanned readmission within 30 days of being discharged (0 -100%). The score is based on dignosis, age, lab data, medications, orders, and past utilization.   Low:  0-14.9   Medium: 15-21.9   High: 22-29.9   Extreme: 30 and above           Subjective: Pleasant, comfortable, oriented x 1, no complaints.   Objective: Vitals:   12/05/23 0805 12/05/23 1108  BP: (!) 156/88 129/75  Pulse: 79 77  Resp:  20 16  Temp: 98.8 F (37.1 C) 97.8 F (36.6 C)  SpO2: 98% 99%    Intake/Output Summary (Last 24 hours) at 12/05/2023 1459 Last data filed at 12/05/2023 0926 Gross per 24 hour   Intake 477 ml  Output 750 ml  Net -273 ml   Filed Weights   11/25/23 2143 12/01/23 0502  Weight: 103.2 kg 99.6 kg    Exam:  General:  Appears calm and comfortable and is in NAD Eyes:  EOMI, normal lids, iris ENT:  grossly normal hearing, lips & tongue, mmm; handlebar mustache Cardiovascular:  RRR, no m/r/g. No LE edema.  Respiratory:   Audible wheezing.  Normal respiratory effort. Abdomen:  soft, NT, ND Skin:  no rash or induration seen on limited exam Musculoskeletal:  generalized weakness, no bony abnormality Psychiatric:  pleasant mood and affect, speech fluent and mildly inappropriate, AOx1-2 Neurologic:  CN 2-12 grossly intact, moves all extremities in coordinated fashion  Data Reviewed: I have reviewed the patient's lab results since admission.  Pertinent labs for today include:   Glucose 187 BUN 36/Creatinine 3.04/GFR 20, stable WBC 8.6 Hgb 7.9, stable     Family Communication: None present  Disposition: Status is: Inpatient Remains inpatient appropriate because: awaiting placement     Time spent: 35 minutes  Unresulted Labs (From admission, onward)    None        Author: Lorita Rosa, MD 12/05/2023 2:59 PM  For on call review [email or web address].

## 2023-12-05 NOTE — Progress Notes (Signed)
Pt.refused midnight vitals.

## 2023-12-05 NOTE — Plan of Care (Signed)

## 2023-12-05 NOTE — Telephone Encounter (Signed)
 Copied from CRM [phone]. Topic: General - Other >> Dec 05, 2023  2:44 PM Crispin Dolphin wrote: Reason for CRM: Patient wife called wanted to leave a message for Dr Joelle Musca. Patient will be put in [employer]. Thank You

## 2023-12-05 NOTE — Progress Notes (Signed)
 [facility]  ROUNDING NOTE   Subjective:   Daniel Kline was admitted to [facility] on 11/25/2023 for Upper GI bleed [K92.2] Pancytopenia (HCC) [D61.818] Gastrointestinal hemorrhage, unspecified gastrointestinal hemorrhage type [K92.2]  Patient seen laying in bed Resting quietly No family present at bedside  Creatinine 3.04   Objective:  Vital signs in last 24 hours:  Temp:  [97.8 F (36.6 C)-98.8 F (37.1 C)] 97.8 F (36.6 C) (04/28 1108) Pulse Rate:  [60-79] 77 (04/28 1108) Resp:  [16-20] 16 (04/28 1108) BP: (124-162)/(51-88) 129/75 (04/28 1108) SpO2:  [98 %-100 %] 99 % (04/28 1108)  Weight change:  Filed Weights   11/25/23 2143 12/01/23 0502  Weight: 103.2 kg 99.6 kg    Intake/Output: I/O last 3 completed shifts: In: 477 [P.O.:477] Out: 1450 [Urine:1450]   Intake/Output this shift:  No intake/output data recorded.  Physical Exam: General: NAD  Head: Normocephalic, atraumatic. Moist oral mucosal membranes  Eyes: Anicteric  Lungs:  Clear to auscultation  Heart: Regular rate and rhythm  Abdomen:  Soft, nontender,   Extremities: No peripheral edema.  Neurologic: Alert and oriented to self, moving all four extremities  Skin: Ecchymoses noted  Access: none    Basic Metabolic Panel: Recent Labs  Lab 11/30/23 0452 12/02/23 0410 12/03/23 0538 12/04/23 0501 12/05/23 0454  NA 137 132* 133* 135 135  K 4.4 4.1 3.9 4.1 4.1  CL 112* 108 106 109 109  CO2 21* 19* 20* 22 21*  GLUCOSE 147* 199* 212* 169* 187*  BUN 42* 44* 38* 41* 36*  CREATININE 3.24* 3.07* 3.16* 3.31* 3.04*  CALCIUM  9.0 8.5* 8.6* 8.4* 8.4*    Liver Function Tests: No results for input(s): "AST", "ALT", "ALKPHOS", "BILITOT", "PROT", "ALBUMIN" in the last 168 hours.  No results for input(s): "LIPASE", "AMYLASE" in the last 168 hours. No results for input(s): "AMMONIA" in the last 168 hours.  CBC: Recent Labs  Lab 12/02/23 0410 12/03/23 0538 12/04/23 0501 12/04/23 0807  12/05/23 0454  WBC 15.2*  15.3* 10.4 9.7 9.6 8.6  NEUTROABS 9.0*  --   --   --  5.1  HGB 7.4*  7.4* 8.4* 7.1* 7.8* 7.9*  HCT 22.4*  22.1* 23.6* 20.9* 22.7* 23.4*  MCV 97.8  94.0 93.7 92.1 93.4 94.4  PLT 146*  150 174 189 196 232    Cardiac Enzymes: No results for input(s): "CKTOTAL", "CKMB", "CKMBINDEX", "TROPONINI" in the last 168 hours.   BNP: Invalid input(s): "POCBNP"  CBG: Recent Labs  Lab 11/30/23 1149 11/30/23 1701 11/30/23 2014 12/01/23 0144 12/02/23 0418  GLUCAP 223* 194* 204* 210* 195*    Microbiology: Results for orders placed or performed during the hospital encounter of 11/25/23  Respiratory (~20 pathogens) panel by PCR     Status: None   Collection Time: 12/02/23 11:00 AM   Specimen: Nasopharyngeal Swab; Respiratory  Result Value Ref Range Status   Adenovirus NOT DETECTED NOT DETECTED Final   Coronavirus 229E NOT DETECTED NOT DETECTED Final    Comment: (NOTE) The Coronavirus on the Respiratory Panel, DOES NOT test for the novel  Coronavirus (2019 nCoV)    Coronavirus HKU1 NOT DETECTED NOT DETECTED Final   Coronavirus NL63 NOT DETECTED NOT DETECTED Final   Coronavirus OC43 NOT DETECTED NOT DETECTED Final   Metapneumovirus NOT DETECTED NOT DETECTED Final   Rhinovirus / Enterovirus NOT DETECTED NOT DETECTED Final   Influenza A NOT DETECTED NOT DETECTED Final   Influenza B NOT DETECTED NOT DETECTED Final   Parainfluenza Virus  1 NOT DETECTED NOT DETECTED Final   Parainfluenza Virus 2 NOT DETECTED NOT DETECTED Final   Parainfluenza Virus 3 NOT DETECTED NOT DETECTED Final   Parainfluenza Virus 4 NOT DETECTED NOT DETECTED Final   Respiratory Syncytial Virus NOT DETECTED NOT DETECTED Final   Bordetella pertussis NOT DETECTED NOT DETECTED Final   Bordetella Parapertussis NOT DETECTED NOT DETECTED Final   Chlamydophila pneumoniae NOT DETECTED NOT DETECTED Final   Mycoplasma pneumoniae NOT DETECTED NOT DETECTED Final    Comment: Performed at [facility], [address], [place], [place] [address]    Coagulation Studies: No results for input(s): "LABPROT", "INR" in the last 72 hours.   Urinalysis: No results for input(s): "COLORURINE", "LABSPEC", "PHURINE", "GLUCOSEU", "HGBUR", "BILIRUBINUR", "KETONESUR", "PROTEINUR", "UROBILINOGEN", "NITRITE", "LEUKOCYTESUR" in the last 72 hours.  Invalid input(s): "APPERANCEUR"    Imaging: No results found.    Medications:    sodium chloride  200 mL/hr (11/28/23 0928)    amLODipine   5 mg Oral Daily   carvedilol   3.125 mg Oral BID WC   feeding supplement  1 Container Oral TID BM   folic acid   1 mg Oral Daily   guaiFENesin   1,200 mg Oral BID   isosorbide  mononitrate  60 mg Oral QAC lunch   pantoprazole   40 mg Oral BID AC   rosuvastatin   10 mg Oral Daily   albuterol , dextrose , guaiFENesin -dextromethorphan , senna-docusate  Assessment/ Plan:  Daniel Kline is a 78 y.o.  male with diabetes mellitus type II, hypertension, glaucoma, hyperlipidemia, T cell lymphoma, and rheumatoid arthritis who is admitted to [facility] on 11/25/2023 for Upper GI bleed [K92.2] Pancytopenia (HCC) [O13.086] Gastrointestinal hemorrhage, unspecified gastrointestinal hemorrhage type [K92.2]  Acute Kidney Injury on Chronic Kidney Disease stage IV: baseline creatinine of 3.8, GFR of 15 on 11/15/23.  Creatinine 2.6 on 09/23/2023. - holding losartan , dapagliflozin, and spironolactone .  - Creatinine stable -No acute indication for dialysis - Will require a follow-up at discharge with Dr. Lamount Pimple.  Hypertension with chronic kidney disease: Maintain the patient on amlodipine  and carvedilol . Blood pressure 129/75  Diabetes mellitus type II with chronic kidney disease:  - holding glipizide  - Primary team to continue management.  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 Status post mutliple transfusions.  - appreciate hematology and GI input - holding methotrexate . -  White count and platelets WNL    LOS: 10 Dominyck Reser 4/28/202512:10 PM

## 2023-12-05 NOTE — TOC Progression Note (Addendum)
 Transition of Care [facility]) - Progression Note    Patient Details  Name: XZAVIOUS BRINKWORTH MRN: [id number] Date of Birth: [birth year]  Transition of Care [facility]) CM/SW Contact  Baird Bombard, RN Phone Number: 12/05/2023, 11:06 AM  Clinical Narrative:    Per [place] Must patient PASRR [id number] E valid 4/25-5/25.  Left message for Antony Baumgartner at [facility] regarding referral.   1:22pm Call placed to [place] at [facility]. Patient has bed offer. Antony Baumgartner advised [facility] is not located in [place] and facility will have to start auth.   MD updated.      Expected Discharge Plan: Home w Home Health Services Barriers to Discharge: Continued Medical Work up  Expected Discharge Plan and Services       Living arrangements for the past 2 months: Single Family Home                                       Social Determinants of Health (SDOH) Interventions SDOH Screenings   Food Insecurity: No Food Insecurity (11/26/2023)  Housing: Low Risk  (11/26/2023)  Transportation Needs: No Transportation Needs (11/26/2023)  Utilities: Not At Risk (11/26/2023)  Depression (PHQ2-9): Low Risk  (10/31/2023)  Financial Resource Strain: Low Risk  (07/20/2017)  Social Connections: Moderately Isolated (11/26/2023)  Tobacco Use: Medium Risk (11/25/2023)    Readmission Risk Interventions    11/30/2023    2:01 PM 09/06/2022    3:47 PM 01/27/2022   12:27 PM  Readmission Risk Prevention Plan  Transportation Screening Complete Complete Complete  PCP or Specialist Appt within 3-5 Days   Complete  HRI or Home Care Consult   Complete  Social Work Consult for Recovery Care Planning/Counseling   Complete  Palliative Care Screening   Complete  Medication Review [employer]) Complete Complete Complete  PCP or Specialist appointment within 3-5 days of discharge Complete    HRI or Home Care Consult Complete    SW Recovery Care/Counseling Consult Complete Complete   Palliative Care Screening Not Applicable Not  Applicable   Skilled Nursing Facility Not Applicable

## 2023-12-06 DIAGNOSIS — K922 Gastrointestinal hemorrhage, unspecified: Secondary | ICD-10-CM | POA: Diagnosis not present

## 2023-12-06 MED ORDER — PANTOPRAZOLE SODIUM 40 MG IV SOLR
40.0000 mg | Freq: Two times a day (BID) | INTRAVENOUS | Status: DC
  Administered 2023-12-07 – 2023-12-11 (×10): 40 mg via INTRAVENOUS
  Filled 2023-12-06 (×11): qty 10

## 2023-12-06 NOTE — Progress Notes (Signed)
 [facility]  ROUNDING NOTE   Subjective:   Daniel Kline was admitted to [facility] on 11/25/2023 for Upper GI bleed [K92.2] Pancytopenia [facility]) [D61.818] Gastrointestinal hemorrhage, unspecified gastrointestinal hemorrhage type [K92.2]  Resting quietly in bed No family present States he may discharge today  Creatinine 3.04 from 4/28   Objective:  Vital signs in last 24 hours:  Temp:  [97 F (36.1 C)-98.6 F (37 C)] 98.6 F (37 C) (04/29 1034) Pulse Rate:  [52-73] 68 (04/29 1034) Resp:  [17-20] 17 (04/29 1034) BP: (105-154)/(55-88) 105/58 (04/29 1034) SpO2:  [95 %-100 %] 100 % (04/29 1034)  Weight change:  Filed Weights   11/25/23 2143 12/01/23 0502  Weight: 103.2 kg 99.6 kg    Intake/Output: I/O last 3 completed shifts: In: 477 [P.O.:477] Out: 750 [Urine:750]   Intake/Output this shift:  No intake/output data recorded.  Physical Exam: General: NAD  Head: Normocephalic, atraumatic. Moist oral mucosal membranes  Eyes: Anicteric  Lungs:  Clear to auscultation  Heart: Regular rate and rhythm  Abdomen:  Soft, nontender,   Extremities: No peripheral edema.  Neurologic: Alert and oriented to self, moving all four extremities  Skin: Ecchymoses noted  Access: none    Basic Metabolic Panel: Recent Labs  Lab 11/30/23 0452 12/02/23 0410 12/03/23 0538 12/04/23 0501 12/05/23 0454  NA 137 132* 133* 135 135  K 4.4 4.1 3.9 4.1 4.1  CL 112* 108 106 109 109  CO2 21* 19* 20* 22 21*  GLUCOSE 147* 199* 212* 169* 187*  BUN 42* 44* 38* 41* 36*  CREATININE 3.24* 3.07* 3.16* 3.31* 3.04*  CALCIUM  9.0 8.5* 8.6* 8.4* 8.4*    Liver Function Tests: No results for input(s): "AST", "ALT", "ALKPHOS", "BILITOT", "PROT", "ALBUMIN" in the last 168 hours.  No results for input(s): "LIPASE", "AMYLASE" in the last 168 hours. No results for input(s): "AMMONIA" in the last 168 hours.  CBC: Recent Labs  Lab 12/02/23 0410 12/03/23 0538 12/04/23 0501 12/04/23 0807  12/05/23 0454  WBC 15.2*  15.3* 10.4 9.7 9.6 8.6  NEUTROABS 9.0*  --   --   --  5.1  HGB 7.4*  7.4* 8.4* 7.1* 7.8* 7.9*  HCT 22.4*  22.1* 23.6* 20.9* 22.7* 23.4*  MCV 97.8  94.0 93.7 92.1 93.4 94.4  PLT 146*  150 174 189 196 232    Cardiac Enzymes: No results for input(s): "CKTOTAL", "CKMB", "CKMBINDEX", "TROPONINI" in the last 168 hours.   BNP: Invalid input(s): "POCBNP"  CBG: Recent Labs  Lab 11/30/23 1149 11/30/23 1701 11/30/23 2014 12/01/23 0144 12/02/23 0418  GLUCAP 223* 194* 204* 210* 195*    Microbiology: Results for orders placed or performed during the hospital encounter of 11/25/23  Respiratory (~20 pathogens) panel by PCR     Status: None   Collection Time: 12/02/23 11:00 AM   Specimen: Nasopharyngeal Swab; Respiratory  Result Value Ref Range Status   Adenovirus NOT DETECTED NOT DETECTED Final   Coronavirus 229E NOT DETECTED NOT DETECTED Final    Comment: (NOTE) The Coronavirus on the Respiratory Panel, DOES NOT test for the novel  Coronavirus (2019 nCoV)    Coronavirus HKU1 NOT DETECTED NOT DETECTED Final   Coronavirus NL63 NOT DETECTED NOT DETECTED Final   Coronavirus OC43 NOT DETECTED NOT DETECTED Final   Metapneumovirus NOT DETECTED NOT DETECTED Final   Rhinovirus / Enterovirus NOT DETECTED NOT DETECTED Final   Influenza A NOT DETECTED NOT DETECTED Final   Influenza B NOT DETECTED NOT DETECTED Final  Parainfluenza Virus 1 NOT DETECTED NOT DETECTED Final   Parainfluenza Virus 2 NOT DETECTED NOT DETECTED Final   Parainfluenza Virus 3 NOT DETECTED NOT DETECTED Final   Parainfluenza Virus 4 NOT DETECTED NOT DETECTED Final   Respiratory Syncytial Virus NOT DETECTED NOT DETECTED Final   Bordetella pertussis NOT DETECTED NOT DETECTED Final   Bordetella Parapertussis NOT DETECTED NOT DETECTED Final   Chlamydophila pneumoniae NOT DETECTED NOT DETECTED Final   Mycoplasma pneumoniae NOT DETECTED NOT DETECTED Final    Comment: Performed at [facility], [address], [place], [place] [address]    Coagulation Studies: No results for input(s): "LABPROT", "INR" in the last 72 hours.   Urinalysis: No results for input(s): "COLORURINE", "LABSPEC", "PHURINE", "GLUCOSEU", "HGBUR", "BILIRUBINUR", "KETONESUR", "PROTEINUR", "UROBILINOGEN", "NITRITE", "LEUKOCYTESUR" in the last 72 hours.  Invalid input(s): "APPERANCEUR"    Imaging: No results found.    Medications:    sodium chloride  200 mL/hr (11/28/23 0928)    amLODipine   5 mg Oral Daily   carvedilol   3.125 mg Oral BID WC   feeding supplement  1 Container Oral TID BM   folic acid   1 mg Oral Daily   guaiFENesin   1,200 mg Oral BID   isosorbide  mononitrate  60 mg Oral QAC lunch   pantoprazole   40 mg Oral BID AC   rosuvastatin   10 mg Oral Daily   albuterol , dextrose , guaiFENesin -dextromethorphan , senna-docusate  Assessment/ Plan:  Daniel Kline is a 78 y.o.  male with diabetes mellitus type II, hypertension, glaucoma, hyperlipidemia, T cell lymphoma, and rheumatoid arthritis who is admitted to [facility] on 11/25/2023 for Upper GI bleed [K92.2] Pancytopenia (HCC) [Q65.784] Gastrointestinal hemorrhage, unspecified gastrointestinal hemorrhage type [K92.2]  Acute Kidney Injury on Chronic Kidney Disease stage IV: baseline creatinine of 3.8, GFR of 15 on 11/15/23.  Creatinine 2.6 on 09/23/2023. - holding losartan , dapagliflozin, and spironolactone .  - Creatinine appears to have stabilized  - Will schedule a follow-up with Dr. Lamount Pimple at discharge..  Hypertension with chronic kidney disease: Maintain the patient on amlodipine  and carvedilol . Blood pressure soft today, 105/58  Diabetes mellitus type II with chronic kidney disease:  - holding glipizide  - Primary team to continue management.  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 Status post mutliple transfusions.  - appreciate hematology and GI input - holding  methotrexate .     LOS: 11 Daniel Kline 4/29/202511:24 AM

## 2023-12-06 NOTE — Patient Outreach (Signed)
 Complex Care Management   Visit Note  12/06/2023  Name:  Daniel Kline MRN: [id number] DOB: [birth year]  Situation: Referral received for Complex Care Management related to Heart Failure I obtained verbal consent from  spouse/ designated party release Daniel Kline  .   Spouse states patient will be transferred to [facility] skilled nursing facility   Background:   Past Medical History:  Diagnosis Date   Anemia    H/O   Anxiety    Arthritis    Atrial flutter (HCC)    a. s/p post ablation in 04/2017   Chronic kidney disease    Complication of anesthesia    CTCL (cutaneous T-cell lymphoma) (HCC)    Diabetes mellitus without complication (HCC)    Diastolic dysfunction    a. 03/2022 Echo: EF 60-65%, no rwma, GrI DD, nl RV fxn.   Dysplastic nevus 12/19/2017   Right distal lat. forearm near wrist. Severe atypia, close to peripheral margin.   Dysplastic nevus 06/21/2018   Upper back right paraspinal. Severe atypia, peripheral margin involved. Excised 07/11/2018, margins free.   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 forearm. SCCis, hypertrophic.    Follow Up Plan:   RN Care manager to follow up with SNF/ patients spouse in 2-3 weeks to assess patients SNF admission status / needs.    Verba Girt RN, BSN, CCM [employer], Population Health Case Manager Phone: [phone]

## 2023-12-06 NOTE — Progress Notes (Signed)
 Physical Therapy Treatment Patient Details Name: Daniel Kline MRN: [id number] DOB: [birth year] Today's Date: 12/06/2023   History of Present Illness Pt is a 78 year old male with history of T-cell lymphoma, rheumatoid arthritis on methotrexate , CKD stage IV, hypertension, neuropathy, non-insulin -dependent diabetes mellitus, hypertension, hyperlipidemia, and per spouse polio who presented to the emergency department for chief concerns of melena stool and falling at home.  MD assessment includes: upper GI bleed, acute blood loss anemia, acute on CKD stage 4, acute metabolic encephalopathy, and hyponatremia.    PT Comments  Pt was pleasantly confused and motivated to participate during the session and put forth good effort throughout.  Pt followed all 1-step commands this session with extra time and cuing.  Pt able to come to standing with cues for hand placement without physical assistance and was steady upon initial stand.  Pt able to amb a max of 12 feet on three separate occasions with seated rest break between bouts.  Pt ambulated with slow cadence and short B step length with occasional min A to guide the RW but was steady with no overt LOB.  Pt reported no adverse symptoms during the session with SpO2 and HR WNL throughout on room air.  Pt will benefit from continued PT services upon discharge to safely address deficits listed in patient problem list for decreased caregiver assistance and eventual return to PLOF.       If plan is discharge home, recommend the following: A lot of help with walking and/or transfers;A little help with bathing/dressing/bathroom;Assistance with cooking/housework;Direct supervision/assist for medications management;Assist for transportation;Help with stairs or ramp for entrance;Supervision due to cognitive status   Can travel by private vehicle     Yes  Equipment Recommendations  None recommended by PT    Recommendations for Other Services       Precautions  / Restrictions Precautions Precautions: Fall Recall of Precautions/Restrictions: Impaired Restrictions Weight Bearing Restrictions Per Provider Order: No     Mobility  Bed Mobility               General bed mobility comments: NT, in recliner pre-post session    Transfers Overall transfer level: Needs assistance Equipment used: Right platform walker Transfers: Sit to/from Stand Sit to Stand: Contact guard assist           General transfer comment: Min multi-modal cues for sequencing but no physical assistance needed to come to standing from recliner    Ambulation/Gait Ambulation/Gait assistance: Min assist Gait Distance (Feet): 12 Feet x 3 Assistive device: Rolling walker (2 wheels) Gait Pattern/deviations: Step-through pattern, Decreased step length - right, Decreased step length - left, Trunk flexed Gait velocity: decreased     General Gait Details: Pt able to perform three bouts of amb with self-selected max of around 12 feet each with CGA and occasional min A to guide the PFRW, no overt LOB   Stairs             Wheelchair Mobility     Tilt Bed    Modified Rankin (Stroke Patients Only)       Balance Overall balance assessment: Needs assistance, History of Falls Sitting-balance support: Feet supported, No upper extremity supported Sitting balance-Leahy Scale: Good     Standing balance support: During functional activity, Bilateral upper extremity supported Standing balance-Leahy Scale: Fair                              Musician Communication:  No apparent difficulties  Cognition Arousal: Alert Behavior During Therapy: WFL for tasks assessed/performed   PT - Cognitive impairments: Orientation, Safety/Judgement, Memory                       PT - Cognition Comments: confused but pleasant Following commands: Impaired Following commands impaired: Follows one step commands with increased time, Follows  one step commands inconsistently    Cueing Cueing Techniques: Verbal cues, Tactile cues, Visual cues  Exercises Total Joint Exercises Towel Squeeze: Strengthening, Both, 10 reps Long Arc Quad: Strengthening, Both, 10 reps Knee Flexion: Strengthening, Both, 10 reps    General Comments        Pertinent Vitals/Pain Pain Assessment Pain Assessment: No/denies pain    Home Living                          Prior Function            PT Goals (current goals can now be found in the care plan section) Progress towards PT goals: Progressing toward goals    Frequency    Min 2X/week      PT Plan      Co-evaluation              AM-PAC PT "6 Clicks" Mobility   Outcome Measure  Help needed turning from your back to your side while in a flat bed without using bedrails?: A Little Help needed moving from lying on your back to sitting on the side of a flat bed without using bedrails?: A Little Help needed moving to and from a bed to a chair (including a wheelchair)?: A Little Help needed standing up from a chair using your arms (e.g., wheelchair or bedside chair)?: A Little Help needed to walk in hospital room?: A Little Help needed climbing 3-5 steps with a railing? : A Lot 6 Click Score: 17    End of Session Equipment Utilized During Treatment: Gait belt Activity Tolerance: Patient tolerated treatment well Patient left: in chair;with call bell/phone within reach;with chair alarm set Nurse Communication: Mobility status PT Visit Diagnosis: History of falling (Z91.81);Unsteadiness on feet (R26.81);Difficulty in walking, not elsewhere classified (R26.2);Muscle weakness (generalized) (M62.81)     Time: 1610-9604 PT Time Calculation (min) (ACUTE ONLY): 21 min  Charges:    $Gait Training: 8-22 mins PT General Charges $$ ACUTE PT VISIT: 1 Visit                     D. Scott Yang Rack PT, DPT 12/06/23, 3:42 PM

## 2023-12-06 NOTE — Plan of Care (Signed)
  Problem: Clinical Measurements: Goal: Ability to maintain clinical measurements within normal limits will improve Outcome: Adequate for Discharge Goal: Will remain free from infection Outcome: Adequate for Discharge Goal: Diagnostic test results will improve Outcome: Adequate for Discharge   

## 2023-12-06 NOTE — Progress Notes (Signed)
 Progress Note   Patient: Daniel Kline:[id number] DOB: 1946-02-14 DOA: 11/25/2023     11 DOS: the patient was seen and examined on 12/06/2023   Brief hospital course: 78yo with h/o cutaneous T-cell lymphoma, rheumatoid arthritis on methotrexate , CKD stage IV, hypertension, neuropathy, non-insulin -dependent diabetes mellitus, hypertension, and hyperlipidemia who presented on 4/18 with melena and falls.  He required 1 unit of PRBC; EGD showed changes suspicious for Barrett's esophagus, non-bleeding gastric ulcer, and gastritis/duodenitis.  He is recommended for outpatient EGD with biopsy.  Also with pancytopenia in the setting of methotrexate  use; he was treated with Filgrastim  x 3 days with improvement.  He is stable for SNF rehab.  Assessment and Plan:  Upper GI bleed Acute blood loss anemia S/p 1 unit of PRBC, 2 units of platelets transfused GI consulted EGD showed suspicious changes for barrett's, non bleeding gastric ulcer, gastritis, duodenitis He will need outpatient EGD for biopsy which will be set up with Dr. Corky Diener Changed to Protonix  40 mg oral twice daily  Hgb stable   Acute on CKD stage 4 Etiology is likely multifactorial including secondary to severe anemia and poor p.o. 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   Type 2 diabetes mellitus with chronic kidney disease, without long-term current use of insulin   A1c 6.2, well controlled Hold glipizide  Hold off insulin  sliding scale, as he is eating poorly   Essential  hypertension Norvasc , Coreg , imdur  resumed Hydralazine  IV as needed ordered   GERD (gastroesophageal reflux disease) Protonix  40 mg oral twice daily   Hyponatremia Stable, not clinically significant   Pancytopenia In the setting of methotrexate  use Appears to be stable at this time Continue to hold methotrexate  Given Filgrastim  therapy x 3 days Outpatient hematology f/u   Rheumatoid arthritis Anti-cyclic citrullinated peptide antibody positive Methotrexate  on hold due to pancytopenia Outpatient rheumatology follow up   Cutaneous T-cell lymphoma  Continue outpatient follow-up with dermatologist as appropriate   Class 2 Obesity Body mass index is 35.44 kg/m.Aaron Aas  Weight loss should be encouraged Outpatient PCP/bariatric medicine f/u encouraged Significantly low or high BMI is associated with higher medical risk including morbidity and mortality    Debility Due to chronic illness PT/ OT recommends SNF placement TOC working on placement.     Consultants: Oncology GI Nephrology PT OT [facility] team   Procedures: EGD 4/21   Antibiotics: None     30 Day Unplanned Readmission Risk Score    Flowsheet Row ED to Hosp-Admission (Current) from 11/25/2023 in [facility]  30 Day Unplanned Readmission Risk Score (%) 39.57 Filed at 12/06/2023 1600       This score is the patient's risk of an unplanned readmission within 30 days of being discharged (0 -100%). The score is based on dignosis, age, lab data, medications, orders, and past utilization.   Low:  0-14.9   Medium: 15-21.9   High: 22-29.9   Extreme: 30 and above           Subjective: Pleasant, no concerns, awaiting approval for placement.   Objective: Vitals:   12/06/23 1034 12/06/23 1534  BP: (!) 105/58 (!) 148/72  Pulse: 68 (!)  47  Resp: 17 18  Temp: 98.6 F (37 C) 98.6 F (37 C)  SpO2: 100% 100%   No intake or output data in the 24 hours ending 12/06/23 1636 Filed Weights    11/25/23 2143 12/01/23 0502  Weight: 103.2 kg 99.6 kg    Exam:  General:  Appears calm and comfortable and is in NAD Eyes:  EOMI, normal lids, iris ENT:  grossly normal hearing, lips & tongue, mmm; handlebar mustache Cardiovascular:  RRR, no m/r/g. No LE edema.  Respiratory:   Audible wheezing.  Normal respiratory effort. Abdomen:  soft, NT, ND Skin:  no rash or induration seen on limited exam Musculoskeletal:  generalized weakness, no bony abnormality Psychiatric:  pleasant mood and affect, speech fluent and mildly inappropriate, AOx1-2 Neurologic:  CN 2-12 grossly intact, moves all extremities in coordinated fashion  Data Reviewed: I have reviewed the patient's lab results since admission.  Pertinent labs for today include:   Glucose 187 BUN 36/Creatinine 3.04/GFR 20, stable WBC 8.6 Hgb 7.9, stable     Family Communication: None present  Disposition: Status is: Inpatient Remains inpatient appropriate because: awaiting placement     Time spent: 25 minutes  Unresulted Labs (From admission, onward)    None        Author: Lorita Rosa, MD 12/06/2023 4:36 PM  For on call review [email or web address].

## 2023-12-06 NOTE — Telephone Encounter (Signed)
 Thanked her for keeping me informed Asked her to call the office when he gets home from rehab

## 2023-12-06 NOTE — Progress Notes (Signed)
 Occupational Therapy Treatment Patient Details Name: Daniel Kline MRN: [id number] DOB: 04/10/1946 Today's Date: 12/06/2023   History of present illness Pt is a 78 year old male with history of T-cell lymphoma, rheumatoid arthritis on methotrexate , CKD stage IV, hypertension, neuropathy, non-insulin -dependent diabetes mellitus, hypertension, hyperlipidemia, and per spouse polio who presented to the emergency department for chief concerns of melena stool and falling at home.  MD assessment includes: upper GI bleed, acute blood loss anemia, acute on CKD stage 4, acute metabolic encephalopathy, and hyponatremia.   OT comments  Daniel Kline was seen for OT treatment on this date. Upon arrival to room pt in bed, with encouragement pt agreeable to tx. Pt requires MAX A don B socks in sitting. SETUP self-feeding in sitting. Pt requesting to call spouse, requires step by step cues to complete phone call (initially attempting to use call bell). CGA sit<>stand x2 from bed, no AD use. CGA + L platform RW bed>chair t/f. Pt making progress toward goals, will continue to follow POC. Discharge recommendation remains appropriate.       If plan is discharge home, recommend the following:  A lot of help with walking and/or transfers;A lot of help with bathing/dressing/bathroom   Equipment Recommendations  Other (comment) (defer)    Recommendations for Other Services      Precautions / Restrictions Precautions Precautions: Fall Recall of Precautions/Restrictions: Impaired Restrictions Weight Bearing Restrictions Per Provider Order: No       Mobility Bed Mobility Overal bed mobility: Needs Assistance Bed Mobility: Supine to Sit     Supine to sit: Mod assist          Transfers Overall transfer level: Needs assistance Equipment used: Left platform walker Transfers: Sit to/from Stand Sit to Stand: Contact guard assist                 Balance Overall balance assessment: Needs assistance,  History of Falls Sitting-balance support: Feet supported, No upper extremity supported Sitting balance-Leahy Scale: Good     Standing balance support: Single extremity supported, During functional activity Standing balance-Leahy Scale: Fair                             ADL either performed or assessed with clinical judgement   ADL Overall ADL's : Needs assistance/impaired                                       General ADL Comments: MAX A don B socks in sitting. SETUP self-feeding in sitting. MIN A simulated BSC t/f     Communication Communication Communication: No apparent difficulties   Cognition Arousal: Alert Behavior During Therapy: WFL for tasks assessed/performed Cognition: Cognition impaired   Orientation impairments: Time, Situation       Executive functioning impairment (select all impairments): Initiation, Organization OT - Cognition Comments: confused and stating "I cant trust them"                 Following commands: Impaired Following commands impaired: Follows one step commands with increased time, Follows one step commands inconsistently      Cueing   Cueing Techniques: Verbal cues, Tactile cues, Visual cues             Pertinent Vitals/ Pain       Pain Assessment Pain Assessment: No/denies pain   Frequency  Min 2X/week  Progress Toward Goals  OT Goals(current goals can now be found in the care plan section)  Progress towards OT goals: Progressing toward goals  Acute Rehab OT Goals OT Goal Formulation: With patient Time For Goal Achievement: 12/14/23 Potential to Achieve Goals: Good ADL Goals Pt Will Perform Grooming: standing;with supervision Pt Will Transfer to Toilet: with contact guard assist;ambulating;regular height toilet Pt Will Perform Toileting - Clothing Manipulation and hygiene: with supervision;sitting/lateral leans Pt/caregiver will Perform Home Exercise Program: Increased  ROM;Increased strength;Both right and left upper extremity;Independently  Plan      Co-evaluation                 AM-PAC OT "6 Clicks" Daily Activity     Outcome Measure   Help from another person eating meals?: None Help from another person taking care of personal grooming?: A Little Help from another person toileting, which includes using toliet, bedpan, or urinal?: A Lot Help from another person bathing (including washing, rinsing, drying)?: A Lot Help from another person to put on and taking off regular upper body clothing?: A Little Help from another person to put on and taking off regular lower body clothing?: A Lot 6 Click Score: 16    End of Session    OT Visit Diagnosis: Unsteadiness on feet (R26.81);Muscle weakness (generalized) (M62.81);Other symptoms and signs involving cognitive function   Activity Tolerance Patient tolerated treatment well   Patient Left in chair;with call bell/phone within reach;with chair alarm set   Nurse Communication          Time: [id number] OT Time Calculation (min): 32 min  Charges: OT General Charges $OT Visit: 1 Visit OT Treatments $Self Care/Home Management : 8-22 mins $Therapeutic Activity: 8-22 mins  Gordan Latina, M.S. OTR/L  12/06/23, 10:19 AM  ascom [phone]

## 2023-12-07 DIAGNOSIS — K922 Gastrointestinal hemorrhage, unspecified: Secondary | ICD-10-CM | POA: Diagnosis not present

## 2023-12-07 NOTE — Progress Notes (Signed)
 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 Will need f/u outpatient w/ GI, Dr. Corky Diener, for biopsy. Continue on PPI   Acute blood loss anemia: secondary to above. S/p 1 unit of pRBCs transfused. Will continue to monitor    AKI on CKDIV: baseline Cr on 3.8. Holding losartan , aldactone , farxiga. Nephro following and recs apprec    Acute metabolic encephalopathy: etiology unclear. Ddx delirium vs dementia vs electrolyte abnormalities    DM2: well controlled, HbA1c 6.2. Diet controlled. Holding home glipizide    HTN: continue on coreg , imdur , amlodipine   GERD: continue on PPI    Hyponatremia: resolved   Pancytopenia: in setting of methotrexate  use. S/p filgrastim  x 3 days. Holding methotrexate . Will f/u outpatient w/ heme  Rheumatoid arthritis: holding methotrexate . Anti-cyclic citrullinated peptide antibody positive. Will need to f/u outpatient w/ rheum    Cutaneous T-cell lymphoma: will need to f/u outpatient w/ onco    Obesity: BMI 35.4. Class 2. Would benefit from weight loss    Generalized weakness: PT/OT recs SNF. Waiting on insurance auth still as per CM        DVT prophylaxis: SCDs Code Status: full  Family Communication: Disposition Plan: waiting on insurance auth still as per CM  Status is: Inpatient Remains inpatient appropriate because: medically stable. Waiting on insurance auth as per CM     Level of care: Progressive Consultants:  Nephro   Procedures:   Antimicrobials:    Subjective: Pt c/o generalized weakness  Objective: Vitals:   12/06/23 2001 12/07/23 0036 12/07/23 0525 12/07/23 0758  BP: 128/75 (!) 156/81 (!) 152/104 (!) 122/96  Pulse: 69 66 65 61  Resp: 18 18 19 16   Temp: 97.6 F (36.4 C) 98.3 F (36.8 C) 97.8 F (36.6 C) 98.4 F (36.9 C)  TempSrc:   Oral Oral  SpO2: 99% 100% 100% 97%  Weight:      Height:        Intake/Output Summary (Last 24 hours) at 12/07/2023 0843 Last data filed at 12/07/2023 0600 Gross per 24 hour  Intake 600 ml  Output 725 ml  Net -125 ml   Filed Weights   11/25/23 2143 12/01/23 0502  Weight: 103.2 kg 99.6 kg    Examination:  General exam: Appears calm and comfortable  Respiratory system: Clear to auscultation. Respiratory effort normal. Cardiovascular system: S1 & S2+. No rubs, gallops or clicks.  Gastrointestinal system: Abdomen is obese, soft and nontender.  Normal bowel sounds heard. Central nervous system: Alert and awake. Moves all extremities  Psychiatry: Judgement and insight appears not at baseline. Flat mood and affect    Data Reviewed: I have personally reviewed following labs and imaging studies  CBC: Recent Labs  Lab 12/02/23 0410 12/03/23 0538 12/04/23 0501 12/04/23 0807 12/05/23 0454  WBC 15.2*  15.3* 10.4 9.7 9.6 8.6  NEUTROABS 9.0*  --   --   --  5.1  HGB 7.4*  7.4* 8.4* 7.1* 7.8* 7.9*  HCT 22.4*  22.1* 23.6* 20.9* 22.7* 23.4*  MCV 97.8  94.0 93.7 92.1 93.4 94.4  PLT 146*  150 174 189 196 232   Basic Metabolic Panel: Recent Labs  Lab 12/02/23 0410 12/03/23 0538 12/04/23 0501 12/05/23 0454  NA 132* 133* 135 135  K 4.1  3.9 4.1 4.1  CL 108 106 109 109  CO2 19* 20* 22 21*  GLUCOSE 199* 212* 169* 187*  BUN 44* 38* 41* 36*  CREATININE 3.07* 3.16* 3.31* 3.04*  CALCIUM  8.5* 8.6* 8.4* 8.4*   GFR: Estimated Creatinine Clearance: 21.8 mL/min (A) (by C-G formula based on SCr of 3.04 mg/dL (H)). Liver Function Tests: No results for input(s): "AST", "ALT", "ALKPHOS", "BILITOT", "PROT", "ALBUMIN" in the last 168 hours. No results for input(s): "LIPASE", "AMYLASE" in the last 168 hours. No results for input(s): "AMMONIA" in the last 168 hours. Coagulation Profile: No results for input(s): "INR", "PROTIME" in the last 168 hours. Cardiac Enzymes: No results for input(s): "CKTOTAL", "CKMB", "CKMBINDEX", "TROPONINI" in the last 168 hours. BNP (last 3 results) No results for input(s): "PROBNP" in the last 8760 hours. HbA1C: No results for input(s): "HGBA1C" in the last 72 hours. CBG: Recent Labs  Lab 11/30/23 1149 11/30/23 1701 11/30/23 2014 12/01/23 0144 12/02/23 0418  GLUCAP 223* 194* 204* 210* 195*   Lipid Profile: No results for input(s): "CHOL", "HDL", "LDLCALC", "TRIG", "CHOLHDL", "LDLDIRECT" in the last 72 hours. Thyroid  Function Tests: No results for input(s): "TSH", "T4TOTAL", "FREET4", "T3FREE", "THYROIDAB" in the last 72 hours. Anemia Panel: No results for input(s): "VITAMINB12", "FOLATE", "FERRITIN", "TIBC", "IRON", "RETICCTPCT" in the last 72 hours. Sepsis Labs: No results for input(s): "PROCALCITON", "LATICACIDVEN" in the last 168 hours.  Recent Results (from the past 240 hours)  Respiratory (~20 pathogens) panel by PCR     Status: None   Collection Time: 12/02/23 11:00 AM   Specimen: Nasopharyngeal Swab; Respiratory  Result Value Ref Range Status   Adenovirus NOT DETECTED NOT DETECTED Final   Coronavirus 229E NOT DETECTED NOT DETECTED Final    Comment: (NOTE) The Coronavirus on the Respiratory Panel, DOES NOT test for the novel  Coronavirus (2019 nCoV)    Coronavirus HKU1 NOT  DETECTED NOT DETECTED Final   Coronavirus NL63 NOT DETECTED NOT DETECTED Final   Coronavirus OC43 NOT DETECTED NOT DETECTED Final   Metapneumovirus NOT DETECTED NOT DETECTED Final   Rhinovirus / Enterovirus NOT DETECTED NOT DETECTED Final   Influenza A NOT DETECTED NOT DETECTED Final   Influenza B NOT DETECTED NOT DETECTED Final   Parainfluenza Virus 1 NOT DETECTED NOT DETECTED Final   Parainfluenza Virus 2 NOT DETECTED NOT DETECTED Final   Parainfluenza Virus 3 NOT DETECTED NOT DETECTED Final   Parainfluenza Virus 4 NOT DETECTED NOT DETECTED Final   Respiratory Syncytial Virus NOT DETECTED NOT DETECTED Final   Bordetella pertussis NOT DETECTED NOT DETECTED Final   Bordetella Parapertussis NOT DETECTED NOT DETECTED Final   Chlamydophila pneumoniae NOT DETECTED NOT DETECTED Final   Mycoplasma pneumoniae NOT DETECTED NOT DETECTED Final    Comment: Performed at [facility], [address] [place], [place] [address]         Radiology Studies: No results found.      Scheduled Meds:  amLODipine   5 mg Oral Daily   carvedilol   3.125 mg Oral BID WC   feeding supplement  1 Container Oral TID BM   folic acid   1 mg Oral Daily   guaiFENesin   1,200 mg Oral BID   isosorbide  mononitrate  60 mg Oral QAC lunch   pantoprazole  (PROTONIX ) IV  40 mg Intravenous BID AC   rosuvastatin   10 mg Oral Daily   Continuous Infusions:  sodium chloride  200 mL/hr (11/28/23 0928)     LOS: 12 days        Alphonsus Jeans, MD [facility] Pager [phone]  If 7PM-7AM, please contact night-coverage [email or web address] 12/07/2023, 8:43 AM

## 2023-12-07 NOTE — TOC Progression Note (Signed)
 Transition of Care [facility]) - Progression Note    Patient Details  Name: Daniel Kline MRN: [id number] Date of Birth: 1946-03-28  Transition of [facility] CM/SW Contact  Baird Bombard, RN Phone Number: 12/07/2023, 10:49 AM  Clinical Narrative:    Eldora Greet with Antony Baumgartner at [employer] for update on auth.  Siegfried Dress is still pending.   Expected Discharge Plan: Home w Home Health Services Barriers to Discharge: Continued Medical Work up  Expected Discharge Plan and Services       Living arrangements for the past 2 months: Single Family Home                                       Social Determinants of Health (SDOH) Interventions SDOH Screenings   Food Insecurity: No Food Insecurity (11/26/2023)  Housing: Low Risk  (11/26/2023)  Transportation Needs: No Transportation Needs (11/26/2023)  Utilities: Not At Risk (11/26/2023)  Depression (PHQ2-9): Low Risk  (10/31/2023)  Financial Resource Strain: Low Risk  (07/20/2017)  Social Connections: Moderately Isolated (11/26/2023)  Tobacco Use: Medium Risk (11/25/2023)    Readmission Risk Interventions    11/30/2023    2:01 PM 09/06/2022    3:47 PM 01/27/2022   12:27 PM  Readmission Risk Prevention Plan  Transportation Screening Complete Complete Complete  PCP or Specialist Appt within 3-5 Days   Complete  HRI or Home Care Consult   Complete  Social Work Consult for Recovery Care Planning/Counseling   Complete  Palliative Care Screening   Complete  Medication Review [employer]) Complete Complete Complete  PCP or Specialist appointment within 3-5 days of discharge Complete    HRI or Home Care Consult Complete    SW Recovery Care/Counseling Consult Complete Complete   Palliative Care Screening Not Applicable Not Applicable   Skilled Nursing Facility Not Applicable

## 2023-12-07 NOTE — Progress Notes (Signed)
 [facility]  ROUNDING NOTE   Subjective:   [name] was admitted to [facility] on 11/25/2023 for Upper GI bleed [K92.2] Pancytopenia [facility]) [D61.818] Gastrointestinal hemorrhage, unspecified gastrointestinal hemorrhage type [K92.2]  Sitting up in chair Awaiting insurance auth for rehab No complaints for this team  Creatinine 3.04 from 4/28   Objective:  Vital signs in last 24 hours:  Temp:  [97.6 F (36.4 C)-98.6 F (37 C)] 98.5 F (36.9 C) (04/30 1200) Pulse Rate:  [47-73] 73 (04/30 1200) Resp:  [16-19] 16 (04/30 1200) BP: (122-156)/(72-104) 134/78 (04/30 1200) SpO2:  [97 %-100 %] 98 % (04/30 1200)  Weight change:  Filed Weights   11/25/23 2143 12/01/23 0502  Weight: 103.2 kg 99.6 kg    Intake/Output: I/O last 3 completed shifts: In: 600 [P.O.:600] Out: 725 [Urine:725]   Intake/Output this shift:  No intake/output data recorded.  Physical Exam: General: NAD  Head: Normocephalic, atraumatic. Moist oral mucosal membranes  Eyes: Anicteric  Lungs:  Clear to auscultation  Heart: Regular rate and rhythm  Abdomen:  Soft, nontender,   Extremities: No peripheral edema.  Neurologic: Alert and oriented to self, moving all four extremities  Skin: Ecchymoses noted  Access: none    Basic Metabolic Panel: Recent Labs  Lab 12/02/23 0410 12/03/23 0538 12/04/23 0501 12/05/23 0454  NA 132* 133* 135 135  K 4.1 3.9 4.1 4.1  CL 108 106 109 109  CO2 19* 20* 22 21*  GLUCOSE 199* 212* 169* 187*  BUN 44* 38* 41* 36*  CREATININE 3.07* 3.16* 3.31* 3.04*  CALCIUM  8.5* 8.6* 8.4* 8.4*    Liver Function Tests: No results for input(s): "AST", "ALT", "ALKPHOS", "BILITOT", "PROT", "ALBUMIN" in the last 168 hours.  No results for input(s): "LIPASE", "AMYLASE" in the last 168 hours. No results for input(s): "AMMONIA" in the last 168 hours.  CBC: Recent Labs  Lab 12/02/23 0410 12/03/23 0538 12/04/23 0501 12/04/23 0807 12/05/23 0454  WBC 15.2*  15.3*  10.4 9.7 9.6 8.6  NEUTROABS 9.0*  --   --   --  5.1  HGB 7.4*  7.4* 8.4* 7.1* 7.8* 7.9*  HCT 22.4*  22.1* 23.6* 20.9* 22.7* 23.4*  MCV 97.8  94.0 93.7 92.1 93.4 94.4  PLT 146*  150 174 189 196 232    Cardiac Enzymes: No results for input(s): "CKTOTAL", "CKMB", "CKMBINDEX", "TROPONINI" in the last 168 hours.   BNP: Invalid input(s): "POCBNP"  CBG: Recent Labs  Lab 11/30/23 1701 11/30/23 2014 12/01/23 0144 12/02/23 0418  GLUCAP 194* 204* 210* 195*    Microbiology: Results for orders placed or performed during the hospital encounter of 11/25/23  Respiratory (~20 pathogens) panel by PCR     Status: None   Collection Time: 12/02/23 11:00 AM   Specimen: Nasopharyngeal Swab; Respiratory  Result Value Ref Range Status   Adenovirus NOT DETECTED NOT DETECTED Final   Coronavirus 229E NOT DETECTED NOT DETECTED Final    Comment: (NOTE) The Coronavirus on the Respiratory Panel, DOES NOT test for the novel  Coronavirus (2019 nCoV)    Coronavirus HKU1 NOT DETECTED NOT DETECTED Final   Coronavirus NL63 NOT DETECTED NOT DETECTED Final   Coronavirus OC43 NOT DETECTED NOT DETECTED Final   Metapneumovirus NOT DETECTED NOT DETECTED Final   Rhinovirus / Enterovirus NOT DETECTED NOT DETECTED Final   Influenza A NOT DETECTED NOT DETECTED Final   Influenza B NOT DETECTED NOT DETECTED Final   Parainfluenza Virus 1 NOT DETECTED NOT DETECTED Final   Parainfluenza  Virus 2 NOT DETECTED NOT DETECTED Final   Parainfluenza Virus 3 NOT DETECTED NOT DETECTED Final   Parainfluenza Virus 4 NOT DETECTED NOT DETECTED Final   Respiratory Syncytial Virus NOT DETECTED NOT DETECTED Final   Bordetella pertussis NOT DETECTED NOT DETECTED Final   Bordetella Parapertussis NOT DETECTED NOT DETECTED Final   Chlamydophila pneumoniae NOT DETECTED NOT DETECTED Final   Mycoplasma pneumoniae NOT DETECTED NOT DETECTED Final    Comment: Performed at [facility], [address], [place], [place] [address]     Coagulation Studies: No results for input(s): "LABPROT", "INR" in the last 72 hours.   Urinalysis: No results for input(s): "COLORURINE", "LABSPEC", "PHURINE", "GLUCOSEU", "HGBUR", "BILIRUBINUR", "KETONESUR", "PROTEINUR", "UROBILINOGEN", "NITRITE", "LEUKOCYTESUR" in the last 72 hours.  Invalid input(s): "APPERANCEUR"    Imaging: No results found.    Medications:    sodium chloride  200 mL/hr (11/28/23 0928)    amLODipine   5 mg Oral Daily   carvedilol   3.125 mg Oral BID WC   feeding supplement  1 Container Oral TID BM   folic acid   1 mg Oral Daily   guaiFENesin   1,200 mg Oral BID   isosorbide  mononitrate  60 mg Oral QAC lunch   pantoprazole  (PROTONIX ) IV  40 mg Intravenous BID AC   rosuvastatin   10 mg Oral Daily   albuterol , dextrose , guaiFENesin -dextromethorphan , senna-docusate  Assessment/ Plan:  [name] is a 78 y.o.  male with diabetes mellitus type II, hypertension, glaucoma, hyperlipidemia, T cell lymphoma, and rheumatoid arthritis who is admitted to [facility] on 11/25/2023 for Upper GI bleed [K92.2] Pancytopenia (HCC) [U04.540] Gastrointestinal hemorrhage, unspecified gastrointestinal hemorrhage type [K92.2]  Acute Kidney Injury on Chronic Kidney Disease stage IV: baseline creatinine of 3.8, GFR of 15 on 11/15/23.  Creatinine 2.6 on 09/23/2023. - holding losartan , dapagliflozin, and spironolactone .   - Will schedule a follow-up with Dr. Lamount Pimple at discharge..  Hypertension with chronic kidney disease: Maintain the patient on amlodipine  and carvedilol . Blood pressure stable today  Diabetes mellitus type II with chronic kidney disease:  - holding glipizide  - Primary team to continue management.  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 Status post mutliple transfusions.  - appreciate hematology and GI input - holding methotrexate .     LOS: 12 Keziah Drotar 4/30/20253:11 PM

## 2023-12-07 NOTE — Progress Notes (Signed)
 Physical Therapy Treatment Patient Details Name: Daniel Kline MRN: [id number] DOB: Feb 11, 1946 Today's Date: 12/07/2023   History of Present Illness Pt is a 78 year old male with history of T-cell lymphoma, rheumatoid arthritis on methotrexate , CKD stage IV, hypertension, neuropathy, non-insulin -dependent diabetes mellitus, hypertension, hyperlipidemia, and per spouse polio who presented to the emergency department for chief concerns of melena stool and falling at home.  MD assessment includes: upper GI bleed, acute blood loss anemia, acute on CKD stage 4, acute metabolic encephalopathy, and hyponatremia.    PT Comments  Patient received in recliner. He is pleasantly confused. HOH. Patient is able to perform tranfers with supervision and cues. Ambulated 110 feet with R platform RW and cga. No lob. Slow, steady gait. Decreased stride length. He will continue to benefit from skilled PT to improve strength and endurance.      If plan is discharge home, recommend the following: A little help with walking and/or transfers;A little help with bathing/dressing/bathroom;Assist for transportation;Help with stairs or ramp for entrance   Can travel by private vehicle     Yes  Equipment Recommendations  None recommended by PT    Recommendations for Other Services       Precautions / Restrictions Precautions Precautions: Fall Recall of Precautions/Restrictions: Impaired Restrictions Weight Bearing Restrictions Per Provider Order: No     Mobility  Bed Mobility               General bed mobility comments: NT, in recliner pre-post session    Transfers Overall transfer level: Modified independent Equipment used: Right platform walker Transfers: Sit to/from Stand Sit to Stand: Supervision                Ambulation/Gait Ambulation/Gait assistance: Contact guard assist Gait Distance (Feet): 110 Feet Assistive device: Right platform walker Gait Pattern/deviations: Step-through  pattern, Decreased step length - right, Decreased step length - left, Decreased stride length, Trunk flexed, Shuffle Gait velocity: decreased     General Gait Details: Cues needed to stay close to AD, R UE placement on platform. ( used due to patient cannot extend elbow post polio)   Stairs             Wheelchair Mobility     Tilt Bed    Modified Rankin (Stroke Patients Only)       Balance Overall balance assessment: Needs assistance Sitting-balance support: Feet supported Sitting balance-Leahy Scale: Normal     Standing balance support: Bilateral upper extremity supported, During functional activity, Reliant on assistive device for balance Standing balance-Leahy Scale: Good Standing balance comment: Pt reports fall at home with facial injury ~ 3 weeks ago, as well as a "slide" from chair to floor < 1 week ago, no lob this session                            Communication Communication Communication: No apparent difficulties  Cognition Arousal: Alert Behavior During Therapy: WFL for tasks assessed/performed   PT - Cognitive impairments: Orientation, Awareness, Problem solving, Safety/Judgement, Memory   Orientation impairments: Time, Situation, Place                   PT - Cognition Comments: confused but pleasant Following commands: Impaired Following commands impaired: Follows one step commands inconsistently, Follows one step commands with increased time    Cueing Cueing Techniques: Verbal cues, Gestural cues  Exercises      General Comments  Pertinent Vitals/Pain Pain Assessment Pain Assessment: No/denies pain    Home Living                          Prior Function            PT Goals (current goals can now be found in the care plan section) Acute Rehab PT Goals Patient Stated Goal: To get stronger PT Goal Formulation: With patient Time For Goal Achievement: 12/13/23 Potential to Achieve Goals:  Good Progress towards PT goals: Progressing toward goals    Frequency    Min 2X/week      PT Plan      Co-evaluation              AM-PAC PT "6 Clicks" Mobility   Outcome Measure  Help needed turning from your back to your side while in a flat bed without using bedrails?: A Little Help needed moving from lying on your back to sitting on the side of a flat bed without using bedrails?: A Little Help needed moving to and from a bed to a chair (including a wheelchair)?: A Little Help needed standing up from a chair using your arms (e.g., wheelchair or bedside chair)?: A Little Help needed to walk in hospital room?: A Little Help needed climbing 3-5 steps with a railing? : A Lot 6 Click Score: 17    End of Session Equipment Utilized During Treatment: Gait belt Activity Tolerance: Patient tolerated treatment well Patient left: in chair;with call bell/phone within reach;with chair alarm set Nurse Communication: Mobility status PT Visit Diagnosis: History of falling (Z91.81);Muscle weakness (generalized) (M62.81);Difficulty in walking, not elsewhere classified (R26.2)     Time: 0960-4540 PT Time Calculation (min) (ACUTE ONLY): 17 min  Charges:    $Gait Training: 8-22 mins PT General Charges $$ ACUTE PT VISIT: 1 Visit                     Aryssa Rosamond, PT, GCS 12/07/23,2:53 PM

## 2023-12-07 NOTE — Plan of Care (Signed)
  Problem: Education: Goal: Knowledge of General Education information will improve Description: Including pain rating scale, medication(s)/side effects and non-pharmacologic comfort measures Outcome: Progressing   Problem: Health Behavior/Discharge Planning: Goal: Ability to manage health-related needs will improve Outcome: Progressing   Problem: Clinical Measurements: Goal: Ability to maintain clinical measurements within normal limits will improve Outcome: Progressing Goal: Will remain free from infection Outcome: Progressing Goal: Diagnostic test results will improve Outcome: Progressing Goal: Respiratory complications will improve Outcome: Progressing Goal: Cardiovascular complication will be avoided Outcome: Progressing   Problem: Activity: Goal: Risk for activity intolerance will decrease Outcome: Progressing   Problem: Nutrition: Goal: Adequate nutrition will be maintained Outcome: Progressing   Problem: Coping: Goal: Level of anxiety will decrease Outcome: Progressing   Problem: Elimination: Goal: Will not experience complications related to bowel motility Outcome: Progressing Goal: Will not experience complications related to urinary retention Outcome: Progressing   Problem: Pain Managment: Goal: General experience of comfort will improve and/or be controlled Outcome: Progressing   Problem: Safety: Goal: Ability to remain free from injury will improve Outcome: Progressing   Problem: Skin Integrity: Goal: Risk for impaired skin integrity will decrease Outcome: Progressing   Problem: Education: Goal: Ability to identify signs and symptoms of gastrointestinal bleeding will improve Outcome: Progressing   Problem: Bowel/Gastric: Goal: Will show no signs and symptoms of gastrointestinal bleeding Outcome: Progressing   Problem: Fluid Volume: Goal: Will show no signs and symptoms of excessive bleeding Outcome: Progressing   Problem: Clinical  Measurements: Goal: Complications related to the disease process, condition or treatment will be avoided or minimized Outcome: Progressing   Problem: Nutrition Goal: Patient maintains adequate hydration Outcome: Progressing Goal: Patient maintains weight Outcome: Progressing Goal: Patient/Family demonstrates understanding of diet Outcome: Progressing Goal: Patient/Family independently completes tube feeding Outcome: Progressing Goal: Patient will have no more than 5 lb weight change during LOS Outcome: Progressing Goal: Patient will utilize adaptive techniques to administer nutrition Outcome: Progressing Goal: Patient will verbalize dietary restrictions Outcome: Progressing

## 2023-12-08 ENCOUNTER — Other Ambulatory Visit: Payer: Self-pay | Admitting: Internal Medicine

## 2023-12-08 DIAGNOSIS — K922 Gastrointestinal hemorrhage, unspecified: Secondary | ICD-10-CM | POA: Diagnosis not present

## 2023-12-08 LAB — BASIC METABOLIC PANEL WITH GFR
Anion gap: 6 (ref 5–15)
BUN: 51 mg/dL — ABNORMAL HIGH (ref 8–23)
CO2: 19 mmol/L — ABNORMAL LOW (ref 22–32)
Calcium: 8.8 mg/dL — ABNORMAL LOW (ref 8.9–10.3)
Chloride: 107 mmol/L (ref 98–111)
Creatinine, Ser: 3.55 mg/dL — ABNORMAL HIGH (ref 0.61–1.24)
GFR, Estimated: 17 mL/min — ABNORMAL LOW (ref >60)
Glucose, Bld: 195 mg/dL — ABNORMAL HIGH (ref 70–99)
Potassium: 4.5 mmol/L (ref 3.5–5.1)
Sodium: 132 mmol/L — ABNORMAL LOW (ref 135–145)

## 2023-12-08 LAB — CBC
HCT: 27.7 % — ABNORMAL LOW (ref 39.0–52.0)
Hemoglobin: 9.3 g/dL — ABNORMAL LOW (ref 13.0–17.0)
MCH: 32 pg (ref 26.0–34.0)
MCHC: 33.6 g/dL (ref 30.0–36.0)
MCV: 95.2 fL (ref 80.0–100.0)
Platelets: 329 10*3/uL (ref 150–400)
RBC: 2.91 MIL/uL — ABNORMAL LOW (ref 4.22–5.81)
RDW: 19.2 % — ABNORMAL HIGH (ref 11.5–15.5)
WBC: 9 10*3/uL (ref 4.0–10.5)
nRBC: 2.2 % — ABNORMAL HIGH (ref 0.0–0.2)

## 2023-12-08 NOTE — Telephone Encounter (Signed)
 Name of Medication: Hydrocodone  Name of Pharmacy: [facility] Last Fill or Written Date and Quantity: 11-09-23 #60 Last Office Visit and Type: 10-31-23 Next Office Visit and Type: No Future OV Last Controlled Substance Agreement Date: 01-27-23 Last UDS: 01-27-23

## 2023-12-08 NOTE — Plan of Care (Signed)
  Problem: Education: Goal: Knowledge of General Education information will improve Description: Including pain rating scale, medication(s)/side effects and non-pharmacologic comfort measures Outcome: Progressing   Problem: Health Behavior/Discharge Planning: Goal: Ability to manage health-related needs will improve Outcome: Progressing   Problem: Clinical Measurements: Goal: Ability to maintain clinical measurements within normal limits will improve Outcome: Progressing Goal: Will remain free from infection Outcome: Progressing Goal: Diagnostic test results will improve Outcome: Progressing Goal: Respiratory complications will improve Outcome: Progressing Goal: Cardiovascular complication will be avoided Outcome: Progressing   Problem: Activity: Goal: Risk for activity intolerance will decrease Outcome: Progressing   Problem: Nutrition: Goal: Adequate nutrition will be maintained Outcome: Progressing   Problem: Coping: Goal: Level of anxiety will decrease Outcome: Progressing   Problem: Elimination: Goal: Will not experience complications related to bowel motility Outcome: Progressing Goal: Will not experience complications related to urinary retention Outcome: Progressing   Problem: Pain Managment: Goal: General experience of comfort will improve and/or be controlled Outcome: Progressing   Problem: Safety: Goal: Ability to remain free from injury will improve Outcome: Progressing   Problem: Skin Integrity: Goal: Risk for impaired skin integrity will decrease Outcome: Progressing   Problem: Education: Goal: Ability to identify signs and symptoms of gastrointestinal bleeding will improve Outcome: Progressing   Problem: Bowel/Gastric: Goal: Will show no signs and symptoms of gastrointestinal bleeding Outcome: Progressing   Problem: Fluid Volume: Goal: Will show no signs and symptoms of excessive bleeding Outcome: Progressing   Problem: Clinical  Measurements: Goal: Complications related to the disease process, condition or treatment will be avoided or minimized Outcome: Progressing   Problem: Nutrition Goal: Patient maintains adequate hydration Outcome: Progressing Goal: Patient maintains weight Outcome: Progressing Goal: Patient/Family demonstrates understanding of diet Outcome: Progressing Goal: Patient/Family independently completes tube feeding Outcome: Progressing Goal: Patient will have no more than 5 lb weight change during LOS Outcome: Progressing Goal: Patient will utilize adaptive techniques to administer nutrition Outcome: Progressing Goal: Patient will verbalize dietary restrictions Outcome: Progressing

## 2023-12-08 NOTE — Telephone Encounter (Signed)
 It does not allow me to refuse a controlled substance.

## 2023-12-08 NOTE — Progress Notes (Signed)
 Occupational Therapy Treatment Patient Details Name: Daniel Kline MRN: [id number] DOB: Dec 28, 1945 Today's Date: 12/08/2023   History of present illness Pt is a 78 year old male with history of T-cell lymphoma, rheumatoid arthritis on methotrexate , CKD stage IV, hypertension, neuropathy, non-insulin -dependent diabetes mellitus, hypertension, hyperlipidemia, and per spouse polio who presented to the emergency department for chief concerns of melena stool and falling at home.  MD assessment includes: upper GI bleed, acute blood loss anemia, acute on CKD stage 4, acute metabolic encephalopathy, and hyponatremia.   OT comments  Daniel Kline was seen for OT treatment on this date. Upon arrival to room pt seated in chair, agreeable to tx. Pt requires MOD A don underwear, assist for threading and finishing pulling up over rear. SBA + RW for in room mobility ~10 ft. MIN A UB bathing in sitting, assist for chronic RUE deficits (hx of polio). Pt making good progress toward goals, will continue to follow POC. Discharge recommendation remains appropriate.        If plan is discharge home, recommend the following:  A lot of help with walking and/or transfers;A lot of help with bathing/dressing/bathroom   Equipment Recommendations  BSC/3in1    Recommendations for Other Services      Precautions / Restrictions Precautions Precautions: Fall Recall of Precautions/Restrictions: Impaired Restrictions Weight Bearing Restrictions Per Provider Order: No       Mobility Bed Mobility               General bed mobility comments: not tested    Transfers Overall transfer level: Needs assistance Equipment used: Right platform walker Transfers: Sit to/from Stand Sit to Stand: Supervision                 Balance Overall balance assessment: Needs assistance Sitting-balance support: Feet supported Sitting balance-Leahy Scale: Normal     Standing balance support: No upper extremity supported,  During functional activity Standing balance-Leahy Scale: Fair                             ADL either performed or assessed with clinical judgement   ADL Overall ADL's : Needs assistance/impaired                                       General ADL Comments: MOD A don underwear, assist for threading and finishing pulling up over rear. SBA + RW for toilet t/f     Communication Communication Communication: No apparent difficulties   Cognition Arousal: Alert Behavior During Therapy: WFL for tasks assessed/performed Cognition: Cognition impaired             OT - Cognition Comments: STM deficits                 Following commands: Impaired        Cueing   Cueing Techniques: Verbal cues, Gestural cues             Pertinent Vitals/ Pain       Pain Assessment Pain Assessment: No/denies pain   Frequency  Min 2X/week        Progress Toward Goals  OT Goals(current goals can now be found in the care plan section)  Progress towards OT goals: Progressing toward goals  Acute Rehab OT Goals OT Goal Formulation: With patient Time For Goal Achievement: 12/14/23 Potential to Achieve Goals: Good ADL Goals Pt  Will Perform Grooming: standing;with supervision Pt Will Transfer to Toilet: with contact guard assist;ambulating;regular height toilet Pt Will Perform Toileting - Clothing Manipulation and hygiene: with supervision;sitting/lateral leans Pt/caregiver will Perform Home Exercise Program: Increased ROM;Increased strength;Both right and left upper extremity;Independently  Plan      Co-evaluation                 AM-PAC OT "6 Clicks" Daily Activity     Outcome Measure   Help from another person eating meals?: None Help from another person taking care of personal grooming?: A Little Help from another person toileting, which includes using toliet, bedpan, or urinal?: A Lot Help from another person bathing (including washing,  rinsing, drying)?: A Lot Help from another person to put on and taking off regular upper body clothing?: A Little Help from another person to put on and taking off regular lower body clothing?: A Lot 6 Click Score: 16    End of Session Equipment Utilized During Treatment: Rolling walker (2 wheels)  OT Visit Diagnosis: Unsteadiness on feet (R26.81);Muscle weakness (generalized) (M62.81);Other symptoms and signs involving cognitive function   Activity Tolerance Patient tolerated treatment well   Patient Left in chair;with call bell/phone within reach;with chair alarm set;with family/visitor present   Nurse Communication          Time: 1610-9604 OT Time Calculation (min): 20 min  Charges: OT General Charges $OT Visit: 1 Visit OT Treatments $Self Care/Home Management : 8-22 mins  Gordan Latina, M.S. OTR/L  12/08/23, 3:56 PM  ascom [phone]

## 2023-12-08 NOTE — Progress Notes (Signed)
 PROGRESS NOTE    Daniel Kline  ZOX:[id number] DOB: [birth year] DOA: 11/25/2023 PCP: Helaine Llanos, MD   Assessment & Plan:   Principal Problem:   Upper GI bleed Active Problems:   AKI (acute kidney injury) (HCC)   Morbid obesity (HCC)   Type 2 diabetes mellitus with chronic kidney disease, without long-term current use of insulin  (HCC)   Essential hypertension   Hypercholesterolemia   OSA (obstructive sleep apnea)   Paroxysmal atrial fibrillation (HCC)   Hyponatremia   Dyslipidemia   GERD (gastroesophageal reflux disease)   Cutaneous T-cell lymphoma (HCC)   Anti-cyclic citrullinated peptide antibody positive   Seronegative rheumatoid arthritis (HCC)   Pancytopenia (HCC)   Eschar of lower leg  Assessment and Plan: Upper GI bleed: secondary to suspicious changes for barrett's, non bleeding gastric ulcer, gastritis, duodenitis as found on EGD. Will need f/u outpatient w/ GI, Dr. Corky Diener, for biopsy. Continue on PPI    Acute blood loss anemia: secondary to above. S/p 1 unit of pRBCs transfused. Will continue to monitor    AKI on CKDIV: baseline Cr on 3.8. Cr is trending down. Holding aldactone , losartan , farxiga. Nephro following and recs apprec    Acute metabolic encephalopathy: etiology unclear. Ddx delirium vs dementia vs electrolyte abnormalities    DM2: well controlled, HbA1c 6.2.  Holding home glipizide . Diet controlled   HTN: continue on amldopine, coreg , imdur    GERD: continue on PPI    Hyponatremia: resolved   Pancytopenia: in setting of methotrexate  use. S/p filgrastim  x 3 days. Holding methotrexate . Will f/u outpatient w/ heme. Resolved   Rheumatoid arthritis: holding methotrexate . Anti-cyclic citrullinated peptide antibody positive. Will need to f/u outpatient w/ rheum    Cutaneous T-cell lymphoma: will need to f/u outpatient w/ onco    Obesity: BMI 35.4. Class 2. Would benefit from weight loss    Generalized weakness: PT/OT recs SNF. Still waiting  on insurance auth as per CM      DVT prophylaxis: SCDs Code Status: full  Family Communication: Disposition Plan: waiting on insurance auth still as per CM  Status is: Inpatient Remains inpatient appropriate because: medically stable. Still waiting on insurance auth     Level of care: Progressive Consultants:  Nephro   Procedures:   Antimicrobials:    Subjective: Pt is very upset he is still in the hospital.   Objective: Vitals:   12/07/23 2112 12/08/23 0124 12/08/23 0550 12/08/23 0800  BP: 112/70 (!) 97/57 (!) 143/82 110/65  Pulse: 64 (!) 51 70 78  Resp: 19 18    Temp: 98.5 F (36.9 C) 97.8 F (36.6 C) 98.1 F (36.7 C) 98 F (36.7 C)  TempSrc: Oral Oral Oral   SpO2: 99% 99% 100% 98%  Weight:      Height:        Intake/Output Summary (Last 24 hours) at 12/08/2023 0910 Last data filed at 12/08/2023 0400 Gross per 24 hour  Intake 600 ml  Output 200 ml  Net 400 ml   Filed Weights   11/25/23 2143 12/01/23 0502  Weight: 103.2 kg 99.6 kg    Examination:  General exam: appears agitated   Respiratory system: decreased breath sounds b/l  Cardiovascular system: S1/S2+. No rubs or clicks  Gastrointestinal system: abd is soft, NT, obese & hypoactive bowel sounds Central nervous system: alert & awake. Moves all extremities  Psychiatry: judgement and insight appears poor. Agitated mood and affect    Data Reviewed: I have personally reviewed following labs and imaging studies  CBC: Recent Labs  Lab 12/02/23 0410 12/03/23 0538 12/04/23 0501 12/04/23 0807 12/05/23 0454  WBC 15.2*  15.3* 10.4 9.7 9.6 8.6  NEUTROABS 9.0*  --   --   --  5.1  HGB 7.4*  7.4* 8.4* 7.1* 7.8* 7.9*  HCT 22.4*  22.1* 23.6* 20.9* 22.7* 23.4*  MCV 97.8  94.0 93.7 92.1 93.4 94.4  PLT 146*  150 174 189 196 232   Basic Metabolic Panel: Recent Labs  Lab 12/02/23 0410 12/03/23 0538 12/04/23 0501 12/05/23 0454  NA 132* 133* 135 135  K 4.1 3.9 4.1 4.1  CL 108 106 109 109   CO2 19* 20* 22 21*  GLUCOSE 199* 212* 169* 187*  BUN 44* 38* 41* 36*  CREATININE 3.07* 3.16* 3.31* 3.04*  CALCIUM  8.5* 8.6* 8.4* 8.4*   GFR: Estimated Creatinine Clearance: 21.8 mL/min (A) (by C-G formula based on SCr of 3.04 mg/dL (H)). Liver Function Tests: No results for input(s): "AST", "ALT", "ALKPHOS", "BILITOT", "PROT", "ALBUMIN" in the last 168 hours. No results for input(s): "LIPASE", "AMYLASE" in the last 168 hours. No results for input(s): "AMMONIA" in the last 168 hours. Coagulation Profile: No results for input(s): "INR", "PROTIME" in the last 168 hours. Cardiac Enzymes: No results for input(s): "CKTOTAL", "CKMB", "CKMBINDEX", "TROPONINI" in the last 168 hours. BNP (last 3 results) No results for input(s): "PROBNP" in the last 8760 hours. HbA1C: No results for input(s): "HGBA1C" in the last 72 hours. CBG: Recent Labs  Lab 12/02/23 0418  GLUCAP 195*   Lipid Profile: No results for input(s): "CHOL", "HDL", "LDLCALC", "TRIG", "CHOLHDL", "LDLDIRECT" in the last 72 hours. Thyroid  Function Tests: No results for input(s): "TSH", "T4TOTAL", "FREET4", "T3FREE", "THYROIDAB" in the last 72 hours. Anemia Panel: No results for input(s): "VITAMINB12", "FOLATE", "FERRITIN", "TIBC", "IRON", "RETICCTPCT" in the last 72 hours. Sepsis Labs: No results for input(s): "PROCALCITON", "LATICACIDVEN" in the last 168 hours.  Recent Results (from the past 240 hours)  Respiratory (~20 pathogens) panel by PCR     Status: None   Collection Time: 12/02/23 11:00 AM   Specimen: Nasopharyngeal Swab; Respiratory  Result Value Ref Range Status   Adenovirus NOT DETECTED NOT DETECTED Final   Coronavirus 229E NOT DETECTED NOT DETECTED Final    Comment: (NOTE) The Coronavirus on the Respiratory Panel, DOES NOT test for the novel  Coronavirus (2019 nCoV)    Coronavirus HKU1 NOT DETECTED NOT DETECTED Final   Coronavirus NL63 NOT DETECTED NOT DETECTED Final   Coronavirus OC43 NOT DETECTED NOT  DETECTED Final   Metapneumovirus NOT DETECTED NOT DETECTED Final   Rhinovirus / Enterovirus NOT DETECTED NOT DETECTED Final   Influenza A NOT DETECTED NOT DETECTED Final   Influenza B NOT DETECTED NOT DETECTED Final   Parainfluenza Virus 1 NOT DETECTED NOT DETECTED Final   Parainfluenza Virus 2 NOT DETECTED NOT DETECTED Final   Parainfluenza Virus 3 NOT DETECTED NOT DETECTED Final   Parainfluenza Virus 4 NOT DETECTED NOT DETECTED Final   Respiratory Syncytial Virus NOT DETECTED NOT DETECTED Final   Bordetella pertussis NOT DETECTED NOT DETECTED Final   Bordetella Parapertussis NOT DETECTED NOT DETECTED Final   Chlamydophila pneumoniae NOT DETECTED NOT DETECTED Final   Mycoplasma pneumoniae NOT DETECTED NOT DETECTED Final    Comment: Performed at [facility], [address] [place], [place] [address]         Radiology Studies: No results found.      Scheduled Meds:  amLODipine   5 mg Oral Daily  carvedilol   3.125 mg Oral BID WC   feeding supplement  1 Container Oral TID BM   folic acid   1 mg Oral Daily   guaiFENesin   1,200 mg Oral BID   isosorbide  mononitrate  60 mg Oral QAC lunch   pantoprazole  (PROTONIX ) IV  40 mg Intravenous BID AC   rosuvastatin   10 mg Oral Daily   Continuous Infusions:  sodium chloride  200 mL/hr (11/28/23 0928)     LOS: 13 days        Alphonsus Jeans, MD [facility] Pager [phone]  If 7PM-7AM, please contact night-coverage [email or web address] 12/08/2023, 9:10 AM

## 2023-12-08 NOTE — Progress Notes (Signed)
 [facility]  ROUNDING NOTE   Subjective:   [name] was admitted to [facility] on 11/25/2023 for Upper GI bleed [K92.2] Pancytopenia (HCC) [D61.818] Gastrointestinal hemorrhage, unspecified gastrointestinal hemorrhage type [K92.2]  Patient seen sitting up in chair Alert and oriented to self and place Appetite appears appropriate   Creatinine 3.55   Objective:  Vital signs in last 24 hours:  Temp:  [97.6 F (36.4 C)-98.5 F (36.9 C)] 98 F (36.7 C) (05/01 1154) Pulse Rate:  [51-78] 64 (05/01 1154) Resp:  [16-20] 20 (05/01 1049) BP: (97-143)/(57-82) 137/82 (05/01 1154) SpO2:  [98 %-100 %] 100 % (05/01 1154)  Weight change:  Filed Weights   11/25/23 2143 12/01/23 0502  Weight: 103.2 kg 99.6 kg    Intake/Output: I/O last 3 completed shifts: In: 1200 [P.O.:1200] Out: 925 [Urine:925]   Intake/Output this shift:  No intake/output data recorded.  Physical Exam: General: NAD  Head: Normocephalic, atraumatic. Moist oral mucosal membranes  Eyes: Anicteric  Lungs:  Clear to auscultation  Heart: Regular rate and rhythm  Abdomen:  Soft, nontender,   Extremities: No peripheral edema.  Neurologic: Alert and oriented to self, moving all four extremities  Skin: Ecchymoses noted  Access: none    Basic Metabolic Panel: Recent Labs  Lab 12/02/23 0410 12/03/23 0538 12/04/23 0501 12/05/23 0454 12/08/23 0931  NA 132* 133* 135 135 132*  K 4.1 3.9 4.1 4.1 4.5  CL 108 106 109 109 107  CO2 19* 20* 22 21* 19*  GLUCOSE 199* 212* 169* 187* 195*  BUN 44* 38* 41* 36* 51*  CREATININE 3.07* 3.16* 3.31* 3.04* 3.55*  CALCIUM  8.5* 8.6* 8.4* 8.4* 8.8*    Liver Function Tests: No results for input(s): "AST", "ALT", "ALKPHOS", "BILITOT", "PROT", "ALBUMIN" in the last 168 hours.  No results for input(s): "LIPASE", "AMYLASE" in the last 168 hours. No results for input(s): "AMMONIA" in the last 168 hours.  CBC: Recent Labs  Lab 12/02/23 0410 12/03/23 0538  12/04/23 0501 12/04/23 0807 12/05/23 0454 12/08/23 0931  WBC 15.2*  15.3* 10.4 9.7 9.6 8.6 9.0  NEUTROABS 9.0*  --   --   --  5.1  --   HGB 7.4*  7.4* 8.4* 7.1* 7.8* 7.9* 9.3*  HCT 22.4*  22.1* 23.6* 20.9* 22.7* 23.4* 27.7*  MCV 97.8  94.0 93.7 92.1 93.4 94.4 95.2  PLT 146*  150 174 189 196 232 329    Cardiac Enzymes: No results for input(s): "CKTOTAL", "CKMB", "CKMBINDEX", "TROPONINI" in the last 168 hours.   BNP: Invalid input(s): "POCBNP"  CBG: Recent Labs  Lab 12/02/23 0418  GLUCAP 195*    Microbiology: Results for orders placed or performed during the hospital encounter of 11/25/23  Respiratory (~20 pathogens) panel by PCR     Status: None   Collection Time: 12/02/23 11:00 AM   Specimen: Nasopharyngeal Swab; Respiratory  Result Value Ref Range Status   Adenovirus NOT DETECTED NOT DETECTED Final   Coronavirus 229E NOT DETECTED NOT DETECTED Final    Comment: (NOTE) The Coronavirus on the Respiratory Panel, DOES NOT test for the novel  Coronavirus (2019 nCoV)    Coronavirus HKU1 NOT DETECTED NOT DETECTED Final   Coronavirus NL63 NOT DETECTED NOT DETECTED Final   Coronavirus OC43 NOT DETECTED NOT DETECTED Final   Metapneumovirus NOT DETECTED NOT DETECTED Final   Rhinovirus / Enterovirus NOT DETECTED NOT DETECTED Final   Influenza A NOT DETECTED NOT DETECTED Final   Influenza B NOT DETECTED NOT DETECTED Final  Parainfluenza Virus 1 NOT DETECTED NOT DETECTED Final   Parainfluenza Virus 2 NOT DETECTED NOT DETECTED Final   Parainfluenza Virus 3 NOT DETECTED NOT DETECTED Final   Parainfluenza Virus 4 NOT DETECTED NOT DETECTED Final   Respiratory Syncytial Virus NOT DETECTED NOT DETECTED Final   Bordetella pertussis NOT DETECTED NOT DETECTED Final   Bordetella Parapertussis NOT DETECTED NOT DETECTED Final   Chlamydophila pneumoniae NOT DETECTED NOT DETECTED Final   Mycoplasma pneumoniae NOT DETECTED NOT DETECTED Final    Comment: Performed at [facility], [address], [place], [place] [address]    Coagulation Studies: No results for input(s): "LABPROT", "INR" in the last 72 hours.   Urinalysis: No results for input(s): "COLORURINE", "LABSPEC", "PHURINE", "GLUCOSEU", "HGBUR", "BILIRUBINUR", "KETONESUR", "PROTEINUR", "UROBILINOGEN", "NITRITE", "LEUKOCYTESUR" in the last 72 hours.  Invalid input(s): "APPERANCEUR"    Imaging: No results found.    Medications:    sodium chloride  200 mL/hr (11/28/23 0928)    amLODipine   5 mg Oral Daily   carvedilol   3.125 mg Oral BID WC   feeding supplement  1 Container Oral TID BM   folic acid   1 mg Oral Daily   guaiFENesin   1,200 mg Oral BID   isosorbide  mononitrate  60 mg Oral QAC lunch   pantoprazole  (PROTONIX ) IV  40 mg Intravenous BID AC   rosuvastatin   10 mg Oral Daily   albuterol , dextrose , guaiFENesin -dextromethorphan , senna-docusate  Assessment/ Plan:  [name] is a 78 y.o.  male with diabetes mellitus type II, hypertension, glaucoma, hyperlipidemia, T cell lymphoma, and rheumatoid arthritis who is admitted to [facility] on 11/25/2023 for Upper GI bleed [K92.2] Pancytopenia (HCC) [X91.478] Gastrointestinal hemorrhage, unspecified gastrointestinal hemorrhage type [K92.2]  Acute Kidney Injury on Chronic Kidney Disease stage IV: baseline creatinine of 3.8, GFR of 15 on 11/15/23.  Creatinine 2.6 on 09/23/2023. - holding losartan , dapagliflozin, and spironolactone .   -Updated labs show increased creatinine, 3.55. -No acute indication for dialysis - Will schedule a follow-up with Dr. Lamount Pimple at discharge..  Hypertension with chronic kidney disease: Maintain the patient on amlodipine  and carvedilol . Blood pressure remains stable for this patient  Diabetes mellitus type II with chronic kidney disease:  - holding glipizide  - Primary team to continue management.  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 Status post mutliple  transfusions.  - appreciate hematology and GI input - holding methotrexate . - Hemoglobin and platelets acceptable at this time.     LOS: 13 Daniel Kline 5/1/20251:54 PM

## 2023-12-08 NOTE — Progress Notes (Signed)
 Physical Therapy Treatment Patient Details Name: Daniel Kline MRN: [id number] DOB: [birth year] Today's Date: 12/08/2023   History of Present Illness Pt is a 78 year old male with history of T-cell lymphoma, rheumatoid arthritis on methotrexate , CKD stage IV, hypertension, neuropathy, non-insulin -dependent diabetes mellitus, hypertension, hyperlipidemia, and per spouse polio who presented to the emergency department for chief concerns of melena stool and falling at home.  MD assessment includes: upper GI bleed, acute blood loss anemia, acute on CKD stage 4, acute metabolic encephalopathy, and hyponatremia.    PT Comments  Pt received seated in recliner upon arrival to room and pt agreeable to therapy.  Pt given platform walker and was able to come into standing with supervision from therapist.  Pt then navigate room without any complication and able to walk down to computer area before turning around and coming back to the room.  Pt was given to OT as part of hand off and left with all needs met.        If plan is discharge home, recommend the following: A little help with walking and/or transfers;A little help with bathing/dressing/bathroom;Assist for transportation;Help with stairs or ramp for entrance   Can travel by private vehicle     Yes  Equipment Recommendations  None recommended by PT    Recommendations for Other Services       Precautions / Restrictions Precautions Precautions: Fall Recall of Precautions/Restrictions: Impaired Restrictions Weight Bearing Restrictions Per Provider Order: No     Mobility  Bed Mobility               General bed mobility comments: NT, in recliner pre-post session    Transfers   Equipment used: Right platform walker Transfers: Sit to/from Stand Sit to Stand: Supervision           General transfer comment: Min multi-modal cues for sequencing but no physical assistance needed to come to standing from recliner     Ambulation/Gait Ambulation/Gait assistance: Contact guard assist Gait Distance (Feet): 120 Feet Assistive device: Right platform walker Gait Pattern/deviations: Step-through pattern, Decreased step length - right, Decreased step length - left, Decreased stride length, Trunk flexed, Shuffle Gait velocity: decreased     General Gait Details: Cues needed to stay close to AD, R UE placement on platform. ( used due to patient cannot extend elbow post polio)   Stairs             Wheelchair Mobility     Tilt Bed    Modified Rankin (Stroke Patients Only)       Balance Overall balance assessment: Needs assistance Sitting-balance support: Feet supported Sitting balance-Leahy Scale: Normal     Standing balance support: Bilateral upper extremity supported, During functional activity, Reliant on assistive device for balance Standing balance-Leahy Scale: Good Standing balance comment: Pt reports fall at home with facial injury ~ 3 weeks ago, as well as a "slide" from chair to floor < 1 week ago, no lob this session                            Communication Communication Communication: No apparent difficulties  Cognition Arousal: Alert Behavior During Therapy: WFL for tasks assessed/performed   PT - Cognitive impairments: Orientation, Awareness, Problem solving, Safety/Judgement, Memory   Orientation impairments: Time, Situation, Place                   PT - Cognition Comments: confused but pleasant Following commands: Impaired Following  commands impaired: Follows one step commands inconsistently, Follows one step commands with increased time    Cueing Cueing Techniques: Verbal cues, Gestural cues  Exercises      General Comments        Pertinent Vitals/Pain Pain Assessment Pain Assessment: No/denies pain    Home Living                          Prior Function            PT Goals (current goals can now be found in the care plan  section) Acute Rehab PT Goals Patient Stated Goal: To get stronger PT Goal Formulation: With patient Time For Goal Achievement: 12/13/23 Potential to Achieve Goals: Good Progress towards PT goals: Progressing toward goals    Frequency    Min 2X/week      PT Plan      Co-evaluation              AM-PAC PT "6 Clicks" Mobility   Outcome Measure  Help needed turning from your back to your side while in a flat bed without using bedrails?: A Little Help needed moving from lying on your back to sitting on the side of a flat bed without using bedrails?: A Little Help needed moving to and from a bed to a chair (including a wheelchair)?: A Little Help needed standing up from a chair using your arms (e.g., wheelchair or bedside chair)?: A Little Help needed to walk in hospital room?: A Little Help needed climbing 3-5 steps with a railing? : A Lot 6 Click Score: 17    End of Session Equipment Utilized During Treatment: Gait belt Activity Tolerance: Patient tolerated treatment well Patient left: in chair;with call bell/phone within reach;with chair alarm set Nurse Communication: Mobility status PT Visit Diagnosis: History of falling (Z91.81);Muscle weakness (generalized) (M62.81);Difficulty in walking, not elsewhere classified (R26.2)     Time: 1610-9604 PT Time Calculation (min) (ACUTE ONLY): 18 min  Charges:    $Therapeutic Exercise: 8-22 mins PT General Charges $$ ACUTE PT VISIT: 1 Visit                     Rozanna Corner, PT, DPT Physical Therapist - [facility]  12/08/23, 3:50 PM

## 2023-12-09 ENCOUNTER — Telehealth: Payer: Self-pay

## 2023-12-09 ENCOUNTER — Inpatient Hospital Stay

## 2023-12-09 DIAGNOSIS — K922 Gastrointestinal hemorrhage, unspecified: Secondary | ICD-10-CM | POA: Diagnosis not present

## 2023-12-09 LAB — BASIC METABOLIC PANEL WITH GFR
Anion gap: 10 (ref 5–15)
BUN: 60 mg/dL — ABNORMAL HIGH (ref 8–23)
CO2: 17 mmol/L — ABNORMAL LOW (ref 22–32)
Calcium: 8.8 mg/dL — ABNORMAL LOW (ref 8.9–10.3)
Chloride: 105 mmol/L (ref 98–111)
Creatinine, Ser: 3.98 mg/dL — ABNORMAL HIGH (ref 0.61–1.24)
GFR, Estimated: 15 mL/min — ABNORMAL LOW (ref >60)
Glucose, Bld: 204 mg/dL — ABNORMAL HIGH (ref 70–99)
Potassium: 4.3 mmol/L (ref 3.5–5.1)
Sodium: 132 mmol/L — ABNORMAL LOW (ref 135–145)

## 2023-12-09 LAB — CBC
HCT: 24.1 % — ABNORMAL LOW (ref 39.0–52.0)
Hemoglobin: 7.8 g/dL — ABNORMAL LOW (ref 13.0–17.0)
MCH: 31.2 pg (ref 26.0–34.0)
MCHC: 32.4 g/dL (ref 30.0–36.0)
MCV: 96.4 fL (ref 80.0–100.0)
Platelets: 305 10*3/uL (ref 150–400)
RBC: 2.5 MIL/uL — ABNORMAL LOW (ref 4.22–5.81)
RDW: 19 % — ABNORMAL HIGH (ref 11.5–15.5)
WBC: 11.2 10*3/uL — ABNORMAL HIGH (ref 4.0–10.5)
nRBC: 1 % — ABNORMAL HIGH (ref 0.0–0.2)

## 2023-12-09 MED ORDER — SODIUM CHLORIDE 0.9 % IV SOLN: INTRAVENOUS | Status: DC

## 2023-12-09 NOTE — Telephone Encounter (Signed)
 Copied from CRM [id number]. Topic: General - Other >> Dec 09, 2023 11:33 AM Albertha Alosa wrote: Reason for CRM: Patient wife called in to let Dr.Letvak know that they haven't move him yet they waiting on the authorization so he could be moved to [facility]

## 2023-12-09 NOTE — Progress Notes (Signed)
 [facility]  ROUNDING NOTE   Subjective:   [name] was admitted to [facility] on 11/25/2023 for Upper GI bleed [K92.2] Pancytopenia [facility]) [D61.818] Gastrointestinal hemorrhage, unspecified gastrointestinal hemorrhage type [K92.2]  Patient seen sitting up in chair Alert and oriented Apologetic for his behavior yesterday  Creatinine 3.98   Objective:  Vital signs in last 24 hours:  Temp:  [97.5 F (36.4 C)-98.9 F (37.2 C)] 98.4 F (36.9 C) (05/02 0940) Pulse Rate:  [62-84] 73 (05/02 0940) Resp:  [16-18] 17 (05/02 0940) BP: (118-129)/(64-76) 129/72 (05/02 0950) SpO2:  [96 %-100 %] 96 % (05/02 0940)  Weight change:  Filed Weights   11/25/23 2143 12/01/23 0502  Weight: 103.2 kg 99.6 kg    Intake/Output: I/O last 3 completed shifts: In: 600 [P.O.:600] Out: 200 [Urine:200]   Intake/Output this shift:  No intake/output data recorded.  Physical Exam: General: NAD  Head: Normocephalic, atraumatic. Moist oral mucosal membranes  Eyes: Anicteric  Lungs:  Clear to auscultation  Heart: Regular rate and rhythm  Abdomen:  Soft, nontender,   Extremities: No peripheral edema.  Neurologic: Alert and oriented to self, moving all four extremities  Skin: Ecchymoses noted  Access: none    Basic Metabolic Panel: Recent Labs  Lab 12/03/23 0538 12/04/23 0501 12/05/23 0454 12/08/23 0931 12/09/23 0853  NA 133* 135 135 132* 132*  K 3.9 4.1 4.1 4.5 4.3  CL 106 109 109 107 105  CO2 20* 22 21* 19* 17*  GLUCOSE 212* 169* 187* 195* 204*  BUN 38* 41* 36* 51* 60*  CREATININE 3.16* 3.31* 3.04* 3.55* 3.98*  CALCIUM  8.6* 8.4* 8.4* 8.8* 8.8*    Liver Function Tests: No results for input(s): "AST", "ALT", "ALKPHOS", "BILITOT", "PROT", "ALBUMIN" in the last 168 hours.  No results for input(s): "LIPASE", "AMYLASE" in the last 168 hours. No results for input(s): "AMMONIA" in the last 168 hours.  CBC: Recent Labs  Lab 12/04/23 0501 12/04/23 0807 12/05/23 0454  12/08/23 0931 12/09/23 0853  WBC 9.7 9.6 8.6 9.0 11.2*  NEUTROABS  --   --  5.1  --   --   HGB 7.1* 7.8* 7.9* 9.3* 7.8*  HCT 20.9* 22.7* 23.4* 27.7* 24.1*  MCV 92.1 93.4 94.4 95.2 96.4  PLT 189 196 232 329 305    Cardiac Enzymes: No results for input(s): "CKTOTAL", "CKMB", "CKMBINDEX", "TROPONINI" in the last 168 hours.   BNP: Invalid input(s): "POCBNP"  CBG: No results for input(s): "GLUCAP" in the last 168 hours.   Microbiology: Results for orders placed or performed during the hospital encounter of 11/25/23  Respiratory (~20 pathogens) panel by PCR     Status: None   Collection Time: 12/02/23 11:00 AM   Specimen: Nasopharyngeal Swab; Respiratory  Result Value Ref Range Status   Adenovirus NOT DETECTED NOT DETECTED Final   Coronavirus 229E NOT DETECTED NOT DETECTED Final    Comment: (NOTE) The Coronavirus on the Respiratory Panel, DOES NOT test for the novel  Coronavirus (2019 nCoV)    Coronavirus HKU1 NOT DETECTED NOT DETECTED Final   Coronavirus NL63 NOT DETECTED NOT DETECTED Final   Coronavirus OC43 NOT DETECTED NOT DETECTED Final   Metapneumovirus NOT DETECTED NOT DETECTED Final   Rhinovirus / Enterovirus NOT DETECTED NOT DETECTED Final   Influenza A NOT DETECTED NOT DETECTED Final   Influenza B NOT DETECTED NOT DETECTED Final   Parainfluenza Virus 1 NOT DETECTED NOT DETECTED Final   Parainfluenza Virus 2 NOT DETECTED NOT DETECTED Final  Parainfluenza Virus 3 NOT DETECTED NOT DETECTED Final   Parainfluenza Virus 4 NOT DETECTED NOT DETECTED Final   Respiratory Syncytial Virus NOT DETECTED NOT DETECTED Final   Bordetella pertussis NOT DETECTED NOT DETECTED Final   Bordetella Parapertussis NOT DETECTED NOT DETECTED Final   Chlamydophila pneumoniae NOT DETECTED NOT DETECTED Final   Mycoplasma pneumoniae NOT DETECTED NOT DETECTED Final    Comment: Performed at [facility], [address], [place], [place] [address]    Coagulation Studies: No results for  input(s): "LABPROT", "INR" in the last 72 hours.   Urinalysis: No results for input(s): "COLORURINE", "LABSPEC", "PHURINE", "GLUCOSEU", "HGBUR", "BILIRUBINUR", "KETONESUR", "PROTEINUR", "UROBILINOGEN", "NITRITE", "LEUKOCYTESUR" in the last 72 hours.  Invalid input(s): "APPERANCEUR"    Imaging: No results found.    Medications:    sodium chloride  200 mL/hr (11/28/23 0928)   sodium chloride  50 mL/hr at 12/09/23 1038    amLODipine   5 mg Oral Daily   carvedilol   3.125 mg Oral BID WC   feeding supplement  1 Container Oral TID BM   folic acid   1 mg Oral Daily   guaiFENesin   1,200 mg Oral BID   isosorbide  mononitrate  60 mg Oral QAC lunch   pantoprazole  (PROTONIX ) IV  40 mg Intravenous BID AC   rosuvastatin   10 mg Oral Daily   albuterol , dextrose , guaiFENesin -dextromethorphan , senna-docusate  Assessment/ Plan:  Daniel Kline is a 78 y.o.  male with diabetes mellitus type II, hypertension, glaucoma, hyperlipidemia, T cell lymphoma, and rheumatoid arthritis who is admitted to [facility] on 11/25/2023 for Upper GI bleed [K92.2] Pancytopenia (HCC) [U13.244] Gastrointestinal hemorrhage, unspecified gastrointestinal hemorrhage type [K92.2]  Acute Kidney Injury on Chronic Kidney Disease stage IV: baseline creatinine of 3.8, GFR of 15 on 11/15/23.  Creatinine 2.6 on 09/23/2023. - holding losartan , dapagliflozin, and spironolactone .   - Creatinine elevated today, 3.98. - No urine output recorded - Will order renal ultrasound to evaluate for obstruction - Will also order gentle IV fluids. - Will schedule a follow-up with Dr. Lamount Pimple at discharge..  Hypertension with chronic kidney disease: Maintain the patient on amlodipine  and carvedilol . Blood pressure within range  Diabetes mellitus type II with chronic kidney disease:  - holding glipizide  - Primary team to continue management.  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 Status post mutliple  transfusions.  - appreciate hematology and GI input - holding methotrexate . - Hemoglobin and platelets at goal     LOS: 14 Daniel Kline 5/2/20252:13 PM

## 2023-12-09 NOTE — Progress Notes (Signed)
 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 Will need f/u outpatient w/ GI, Dr. Corky Diener, for biopsy. Continue on PPI   Acute blood loss anemia: secondary to above. S/p 1 unit of pRBCs transfused. Will transfuse if Hb < 7.0    AKI on CKDIV: baseline Cr on 3.8. Cr is trending up today. Holding aldactone , losartan , farxiga. Nephro following and recs apprec    Acute metabolic encephalopathy: etiology unclear. Mental status improved slightly today. Ddx delirium vs dementia vs electrolyte abnormalities    DM2: well controlled, HbA1c 6.2.  Holding home glipizide . Diet controlled   HTN: continue on imdur , amlodipine , coreg    GERD: continue on PPI    Hyponatremia: resolved   Pancytopenia: in setting of methotrexate  use. S/p filgrastim  x 3 days. Holding methotrexate . Will f/u outpatient w/ heme. Resolved   Rheumatoid arthritis: holding methotrexate . Anti-cyclic citrullinated peptide antibody positive. Will need to f/u outpatient w/ rheum    Cutaneous T-cell lymphoma: will need to f/u outpatient w/ onco    Obesity: BMI 35.4. Class 2. Would benefit from weight loss     Generalized weakness: PT/OT recs SNF. Insurance [employer] was never started as per CM 12/09/23 so it was started today       DVT prophylaxis: SCDs Code Status: full  Family Communication: Disposition Plan: waiting on insurance auth still as per CM  Status is: Inpatient Remains inpatient appropriate because: medically stable. Insurance was never started as per CM 12/09/23 and it was started today, 12/09/23     Level of care: Telemetry Medical Consultants:  Nephro   Procedures:   Antimicrobials:    Subjective: Pt c/o fatigue   Objective: Vitals:   12/08/23 1649 12/08/23 1850 12/08/23 2004 12/09/23 0435  BP: 118/64 122/76 125/72 123/75  Pulse: 74 80 84 62  Resp:  18 16 16   Temp: 98.5 F (36.9 C) 98.9 F (37.2 C) (!) 97.5 F (36.4 C) 98.3 F (36.8 C)  TempSrc:  Oral Oral Oral  SpO2: 99% 98% 98% 100%  Weight:      Height:       No intake or output data in the 24 hours ending 12/09/23 0821  Filed Weights   11/25/23 2143 12/01/23 0502  Weight: 103.2 kg 99.6 kg    Examination:  General exam: appears calm & comfortable  Respiratory system: diminished breath sounds b/l   Cardiovascular system: S1 & S2+. No rubs or clicks   Gastrointestinal system: abd is soft, NT, ND & hypoactive bowel sounds  Central nervous system: alert & awake. Moves all extremities  Psychiatry: judgement and insight appears slightly improved. Flat mood and affect     Data Reviewed: I have personally reviewed following labs  and imaging studies  CBC: Recent Labs  Lab 12/03/23 0538 12/04/23 0501 12/04/23 0807 12/05/23 0454 12/08/23 0931  WBC 10.4 9.7 9.6 8.6 9.0  NEUTROABS  --   --   --  5.1  --   HGB 8.4* 7.1* 7.8* 7.9* 9.3*  HCT 23.6* 20.9* 22.7* 23.4* 27.7*  MCV 93.7 92.1 93.4 94.4 95.2  PLT 174 189 196 232 329   Basic Metabolic Panel: Recent Labs  Lab 12/03/23 0538 12/04/23 0501 12/05/23 0454 12/08/23 0931  NA 133* 135 135 132*  K 3.9 4.1 4.1 4.5  CL 106 109 109 107  CO2  20* 22 21* 19*  GLUCOSE 212* 169* 187* 195*  BUN 38* 41* 36* 51*  CREATININE 3.16* 3.31* 3.04* 3.55*  CALCIUM  8.6* 8.4* 8.4* 8.8*   GFR: Estimated Creatinine Clearance: 18.6 mL/min (A) (by C-G formula based on SCr of 3.55 mg/dL (H)). Liver Function Tests: No results for input(s): "AST", "ALT", "ALKPHOS", "BILITOT", "PROT", "ALBUMIN" in the last 168 hours. No results for input(s): "LIPASE", "AMYLASE" in the last 168 hours. No results for input(s): "AMMONIA" in the last 168 hours. Coagulation Profile: No results for input(s): "INR", "PROTIME" in the last 168 hours. Cardiac Enzymes: No results for input(s): "CKTOTAL", "CKMB", "CKMBINDEX", "TROPONINI" in the last 168 hours. BNP (last 3 results) No results for input(s): "PROBNP" in the last 8760 hours. HbA1C: No results for input(s): "HGBA1C" in the last 72 hours. CBG: No results for input(s): "GLUCAP" in the last 168 hours.  Lipid Profile: No results for input(s): "CHOL", "HDL", "LDLCALC", "TRIG", "CHOLHDL", "LDLDIRECT" in the last 72 hours. Thyroid  Function Tests: No results for input(s): "TSH", "T4TOTAL", "FREET4", "T3FREE", "THYROIDAB" in the last 72 hours. Anemia Panel: No results for input(s): "VITAMINB12", "FOLATE", "FERRITIN", "TIBC", "IRON", "RETICCTPCT" in the last 72 hours. Sepsis Labs: No results for input(s): "PROCALCITON", "LATICACIDVEN" in the last 168 hours.  Recent Results (from the past 240 hours)  Respiratory (~20 pathogens) panel by PCR     Status: None   Collection Time: 12/02/23 11:00 AM   Specimen: Nasopharyngeal Swab; Respiratory  Result Value Ref Range Status   Adenovirus NOT DETECTED NOT DETECTED Final   Coronavirus 229E NOT DETECTED NOT DETECTED Final    Comment: (NOTE) The Coronavirus on the Respiratory Panel, DOES NOT test for the novel  Coronavirus (2019 nCoV)    Coronavirus HKU1 NOT DETECTED NOT DETECTED Final   Coronavirus NL63 NOT DETECTED NOT DETECTED Final   Coronavirus OC43 NOT DETECTED NOT  DETECTED Final   Metapneumovirus NOT DETECTED NOT DETECTED Final   Rhinovirus / Enterovirus NOT DETECTED NOT DETECTED Final   Influenza A NOT DETECTED NOT DETECTED Final   Influenza B NOT DETECTED NOT DETECTED Final   Parainfluenza Virus 1 NOT DETECTED NOT DETECTED Final   Parainfluenza Virus 2 NOT DETECTED NOT DETECTED Final   Parainfluenza Virus 3 NOT DETECTED NOT DETECTED Final   Parainfluenza Virus 4 NOT DETECTED NOT DETECTED Final   Respiratory Syncytial Virus NOT DETECTED NOT DETECTED Final   Bordetella pertussis NOT DETECTED NOT DETECTED Final   Bordetella Parapertussis NOT DETECTED NOT DETECTED Final   Chlamydophila pneumoniae NOT DETECTED NOT DETECTED Final   Mycoplasma pneumoniae NOT DETECTED NOT DETECTED Final    Comment: Performed at [facility], [address], [place], [place] [address]         Radiology Studies: No results found.      Scheduled Meds:  amLODipine   5 mg Oral Daily   carvedilol   3.125  mg Oral BID WC   feeding supplement  1 Container Oral TID BM   folic acid   1 mg Oral Daily   guaiFENesin   1,200 mg Oral BID   isosorbide  mononitrate  60 mg Oral QAC lunch   pantoprazole  (PROTONIX ) IV  40 mg Intravenous BID AC   rosuvastatin   10 mg Oral Daily   Continuous Infusions:  sodium chloride  200 mL/hr (11/28/23 0928)     LOS: 14 days        Alphonsus Jeans, MD [facility] Pager [phone]  If 7PM-7AM, please contact night-coverage [email or web address] 12/09/2023, 8:21 AM

## 2023-12-09 NOTE — TOC Progression Note (Addendum)
 Transition of Care [facility]) - Progression Note    Patient Details  Name: Daniel Kline MRN: [id number] Date of Birth: 1945-10-21  [facility] CM/SW Contact  Odilia Bennett, LCSW Phone Number: 12/09/2023, 9:50 AM  Clinical Narrative:   SNF did not start insurance authorization. CSW started insurance authorization through [facility] portal.  12:20 pm: Auth still pending.  12:25 pm: Per PT note, patient not oriented to situation. CSW called and updated wife.  2:56 pm: Auth still pending.  3:53 pm: Insurance requesting peer-to-peer review. MD and wife aware.  Expected Discharge Plan: Home w Home Health Services Barriers to Discharge: Continued Medical Work up  Expected Discharge Plan and Services       Living arrangements for the past 2 months: Single Family Home                                       Social Determinants of Health (SDOH) Interventions SDOH Screenings   Food Insecurity: No Food Insecurity (11/26/2023)  Housing: Low Risk  (11/26/2023)  Transportation Needs: No Transportation Needs (11/26/2023)  Utilities: Not At Risk (11/26/2023)  Depression (PHQ2-9): Low Risk  (10/31/2023)  Financial Resource Strain: Low Risk  (07/20/2017)  Social Connections: Moderately Isolated (11/26/2023)  Tobacco Use: Medium Risk (11/25/2023)    Readmission Risk Interventions    11/30/2023    2:01 PM 09/06/2022    3:47 PM 01/27/2022   12:27 PM  Readmission Risk Prevention Plan  Transportation Screening Complete Complete Complete  PCP or Specialist Appt within 3-5 Days   Complete  HRI or Home Care Consult   Complete  Social Work Consult for Recovery Care Planning/Counseling   Complete  Palliative Care Screening   Complete  Medication Review [employer]) Complete Complete Complete  PCP or Specialist appointment within 3-5 days of discharge Complete    HRI or Home Care Consult Complete    SW Recovery Care/Counseling Consult Complete Complete   Palliative Care  Screening Not Applicable Not Applicable   Skilled Nursing Facility Not Applicable

## 2023-12-09 NOTE — Telephone Encounter (Signed)
 Yeah I can see that he is still there in the hospital

## 2023-12-09 NOTE — Progress Notes (Signed)
 Physical Therapy Treatment Patient Details Name: Daniel Kline MRN: [id number] DOB: [birth year] Today's Date: 12/09/2023   History of Present Illness Pt is a 78 year old male with history of T-cell lymphoma, rheumatoid arthritis on methotrexate , CKD stage IV, hypertension, neuropathy, non-insulin -dependent diabetes mellitus, hypertension, hyperlipidemia, and per spouse polio who presented to the emergency department for chief concerns of melena stool and falling at home.  MD assessment includes: upper GI bleed, acute blood loss anemia, acute on CKD stage 4, acute metabolic encephalopathy, and hyponatremia.    PT Comments  Pt was long sitting in bed upon arrival. Alert but disoriented to situation. Cognition deficits are observed throughout session but pt remained cooperative and pleasant. Very slow moving overall but was able to exit R side of bed, stand to RW, and tolerate ambulation with RW. Pt is progressing however will continue to benefit from skilled PT at DC to maximize independence and safety with all ADLs.    If plan is discharge home, recommend the following: A little help with walking and/or transfers;A little help with bathing/dressing/bathroom;Assist for transportation;Help with stairs or ramp for entrance     Equipment Recommendations  None recommended by PT       Precautions / Restrictions Precautions Precautions: Fall Recall of Precautions/Restrictions: Impaired Restrictions Weight Bearing Restrictions Per Provider Order: No     Mobility  Bed Mobility Overal bed mobility: Needs Assistance Bed Mobility: Supine to Sit  Supine to sit: Min assist, Mod assist, HOB elevated, Used rails  General bed mobility comments: increased time required    Transfers Overall transfer level: Needs assistance Equipment used: Rolling walker (2 wheels) Transfers: Sit to/from Stand Sit to Stand: Contact guard assist   Ambulation/Gait Ambulation/Gait assistance: Contact guard assist,  Supervision Gait Distance (Feet): 80 Feet Assistive device: Rolling walker (2 wheels) Gait Pattern/deviations: Step-through pattern, Decreased step length - right, Decreased step length - left, Decreased stride length, Trunk flexed, Shuffle Gait velocity: decreased  General Gait Details: Extremely slow cadence   Balance Overall balance assessment: Needs assistance Sitting-balance support: Feet supported Sitting balance-Leahy Scale: Normal     Standing balance support: Bilateral upper extremity supported, During functional activity, Reliant on assistive device for balance Standing balance-Leahy Scale: Good Standing balance comment: reliant on BUE support.    Communication Communication Communication: No apparent difficulties  Cognition Arousal: Alert Behavior During Therapy: WFL for tasks assessed/performed  Orientation impairments: Time, Situation, Place    PT - Cognition Comments: confused but pleasant. apoligized for behaviors previous date. Following commands: Impaired      Cueing Cueing Techniques: Verbal cues, Gestural cues         Pertinent Vitals/Pain Pain Assessment Pain Assessment: No/denies pain     PT Goals (current goals can now be found in the care plan section) Progress towards PT goals: Progressing toward goals    Frequency    Min 2X/week       AM-PAC PT "6 Clicks" Mobility   Outcome Measure  Help needed turning from your back to your side while in a flat bed without using bedrails?: A Little Help needed moving from lying on your back to sitting on the side of a flat bed without using bedrails?: A Little Help needed moving to and from a bed to a chair (including a wheelchair)?: A Little Help needed standing up from a chair using your arms (e.g., wheelchair or bedside chair)?: A Little Help needed to walk in hospital room?: A Little Help needed climbing 3-5 steps with a railing? : A  Lot 6 Click Score: 17    End of Session   Activity Tolerance:  Patient tolerated treatment well Patient left: in chair;with call bell/phone within reach;with chair alarm set Nurse Communication: Mobility status PT Visit Diagnosis: History of falling (Z91.81);Muscle weakness (generalized) (M62.81);Difficulty in walking, not elsewhere classified (R26.2)     Time: 5409-8119 PT Time Calculation (min) (ACUTE ONLY): 23 min  Charges:    $Gait Training: 8-22 mins $Therapeutic Activity: 8-22 mins PT General Charges $$ ACUTE PT VISIT: 1 Visit                     Chester Costa PTA 12/09/23, 12:07 PM

## 2023-12-09 NOTE — Plan of Care (Signed)
  Problem: Education: Goal: Knowledge of General Education information will improve Description: Including pain rating scale, medication(s)/side effects and non-pharmacologic comfort measures Outcome: Progressing   Problem: Clinical Measurements: Goal: Respiratory complications will improve Outcome: Progressing Goal: Cardiovascular complication will be avoided Outcome: Progressing   Problem: Activity: Goal: Risk for activity intolerance will decrease Outcome: Progressing   Problem: Nutrition: Goal: Adequate nutrition will be maintained Outcome: Progressing

## 2023-12-10 DIAGNOSIS — K922 Gastrointestinal hemorrhage, unspecified: Secondary | ICD-10-CM | POA: Diagnosis not present

## 2023-12-10 LAB — CBC
HCT: 23.7 % — ABNORMAL LOW (ref 39.0–52.0)
Hemoglobin: 7.8 g/dL — ABNORMAL LOW (ref 13.0–17.0)
MCH: 32.1 pg (ref 26.0–34.0)
MCHC: 32.9 g/dL (ref 30.0–36.0)
MCV: 97.5 fL (ref 80.0–100.0)
Platelets: 287 10*3/uL (ref 150–400)
RBC: 2.43 MIL/uL — ABNORMAL LOW (ref 4.22–5.81)
RDW: 19.3 % — ABNORMAL HIGH (ref 11.5–15.5)
WBC: 7.1 10*3/uL (ref 4.0–10.5)
nRBC: 0.6 % — ABNORMAL HIGH (ref 0.0–0.2)

## 2023-12-10 LAB — BASIC METABOLIC PANEL WITH GFR
Anion gap: 13 (ref 5–15)
BUN: 56 mg/dL — ABNORMAL HIGH (ref 8–23)
CO2: 14 mmol/L — ABNORMAL LOW (ref 22–32)
Calcium: 8.7 mg/dL — ABNORMAL LOW (ref 8.9–10.3)
Chloride: 106 mmol/L (ref 98–111)
Creatinine, Ser: 3.73 mg/dL — ABNORMAL HIGH (ref 0.61–1.24)
GFR, Estimated: 16 mL/min — ABNORMAL LOW (ref >60)
Glucose, Bld: 162 mg/dL — ABNORMAL HIGH (ref 70–99)
Potassium: 4.3 mmol/L (ref 3.5–5.1)
Sodium: 133 mmol/L — ABNORMAL LOW (ref 135–145)

## 2023-12-10 LAB — PROTEIN / CREATININE RATIO, URINE
Creatinine, Urine: 132 mg/dL
Protein Creatinine Ratio: 0.66 mg/mg{creat} — ABNORMAL HIGH (ref 0.00–0.15)
Total Protein, Urine: 87 mg/dL

## 2023-12-10 MED ORDER — SODIUM BICARBONATE 8.4 % IV SOLN
INTRAVENOUS | Status: DC
  Filled 2023-12-10: qty 1000
  Filled 2023-12-10: qty 150

## 2023-12-10 NOTE — Progress Notes (Signed)
 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 Will need f/u outpatient w/ GI, Dr. Corky Diener, for biopsy. Continue on PPI   Acute blood loss anemia: secondary to above. S/p 1 unit of pRBCs transfused so far. Will transfuse if Hb < 7.0     AKI on CKDIV: baseline Cr on 3.8.  Cr is labile. Holding losartan , aldactone , farxiga. Nephro is following and recs apprec   Acute metabolic encephalopathy: etiology unclear. Mental status improved slightly today. Ddx delirium vs dementia vs electrolyte abnormalities    DM2: well controlled, HbA1c 6.2.  Holding home glipizide . Diet controlled   HTN: continue on amlodipine , imdur , coreg    GERD: continue on PPI    Hyponatremia: resolved   Pancytopenia: in setting of methotrexate  use. S/p filgrastim  x 3 days. Holding methotrexate . Will f/u outpatient w/ heme. Resolved   Rheumatoid arthritis: holding methotrexate . Anti-cyclic citrullinated peptide antibody positive. Will need to f/u outpatient w/ rheum    Cutaneous T-cell lymphoma: will need to f/u outpatient w/ onco    Obesity: BMI 35.4. Class 2. Would benefit from weight loss     Generalized weakness: PT/OT recs SNF. Insurance [employer] was never started as per CM 12/09/23 so it was started on 12/09/23. Pt's insurance denied SNF approval despite peer to peer. Pt and pt's wife want to discuss this further to decide on whether or not to appeal the decision.       DVT prophylaxis: SCDs Code Status: full  Family Communication: Disposition Plan: unclear. Pt's insurance denied SNF approval despite peer to peer. Pt and pt's wife will discuss to decide whether or not to file an appeal   Status is: Inpatient Remains inpatient appropriate because: medically stable. See above     Level of care: Telemetry Medical Consultants:  Nephro   Procedures:   Antimicrobials:    Subjective: Pt c/o frustration   Objective: Vitals:   12/09/23 0950 12/09/23 1622 12/09/23 2104 12/10/23 0440  BP: 129/72 129/72 (!) 159/61 (!) 125/92  Pulse:  73 73 (!) 56  Resp:   20 20  Temp:   97.9 F (36.6 C) 98.2 F (36.8 C)  TempSrc:      SpO2:   100% 97%  Weight:      Height:        Intake/Output Summary (Last 24 hours) at 12/10/2023 0809 Last data filed at 12/10/2023 0410 Gross per 24 hour  Intake 721.2 ml  Output 800 ml  Net -78.8 ml    Filed Weights   11/25/23 2143 12/01/23 0502  Weight: 103.2 kg 99.6 kg    Examination:  General exam: appears comfortable   Respiratory system: decreased breath sounds b/l  Cardiovascular system: S1/S2+. No  rubs or clicks  Gastrointestinal system: abd is soft, NT, ND & hypoactive bowel sounds  Central nervous system: alert & awake. Moves all extremities  Psychiatry: judgement and insight appears not at baseline. Tangential thought process    Data Reviewed: I have personally reviewed following labs and imaging studies  CBC: Recent Labs  Lab 12/04/23 0501 12/04/23 0807 12/05/23 0454 12/08/23 0931 12/09/23 0853  WBC 9.7 9.6 8.6 9.0 11.2*  NEUTROABS  --   --  5.1  --   --   HGB 7.1* 7.8* 7.9* 9.3* 7.8*  HCT 20.9* 22.7* 23.4* 27.7*  24.1*  MCV 92.1 93.4 94.4 95.2 96.4  PLT 189 196 232 329 305   Basic Metabolic Panel: Recent Labs  Lab 12/04/23 0501 12/05/23 0454 12/08/23 0931 12/09/23 0853  NA 135 135 132* 132*  K 4.1 4.1 4.5 4.3  CL 109 109 107 105  CO2 22 21* 19* 17*  GLUCOSE 169* 187* 195* 204*  BUN 41* 36* 51* 60*  CREATININE 3.31* 3.04* 3.55* 3.98*  CALCIUM  8.4* 8.4* 8.8* 8.8*   GFR: Estimated Creatinine Clearance: 16.6 mL/min (A) (by C-G formula based on SCr of 3.98 mg/dL (H)). Liver Function Tests: No results for input(s): "AST", "ALT", "ALKPHOS", "BILITOT", "PROT", "ALBUMIN" in the last 168 hours. No results for input(s): "LIPASE", "AMYLASE" in the last 168 hours. No results for input(s): "AMMONIA" in the last 168 hours. Coagulation Profile: No results for input(s): "INR", "PROTIME" in the last 168 hours. Cardiac Enzymes: No results for input(s): "CKTOTAL", "CKMB", "CKMBINDEX", "TROPONINI" in the last 168 hours. BNP (last 3 results) No results for input(s): "PROBNP" in the last 8760 hours. HbA1C: No results for input(s): "HGBA1C" in the last 72 hours. CBG: No results for input(s): "GLUCAP" in the last 168 hours.  Lipid Profile: No results for input(s): "CHOL", "HDL", "LDLCALC", "TRIG", "CHOLHDL", "LDLDIRECT" in the last 72 hours. Thyroid  Function Tests: No results for input(s): "TSH", "T4TOTAL", "FREET4", "T3FREE", "THYROIDAB" in the last 72 hours. Anemia Panel: No results for input(s): "VITAMINB12", "FOLATE", "FERRITIN", "TIBC", "IRON", "RETICCTPCT" in the last 72 hours. Sepsis Labs: No results for input(s): "PROCALCITON", "LATICACIDVEN" in the last 168 hours.  Recent Results (from the past 240 hours)  Respiratory (~20 pathogens) panel by PCR     Status: None   Collection Time: 12/02/23 11:00 AM   Specimen: Nasopharyngeal Swab; Respiratory  Result Value Ref Range Status   Adenovirus NOT DETECTED NOT DETECTED Final   Coronavirus 229E NOT DETECTED NOT DETECTED Final    Comment:  (NOTE) The Coronavirus on the Respiratory Panel, DOES NOT test for the novel  Coronavirus (2019 nCoV)    Coronavirus HKU1 NOT DETECTED NOT DETECTED Final   Coronavirus NL63 NOT DETECTED NOT DETECTED Final   Coronavirus OC43 NOT DETECTED NOT DETECTED Final   Metapneumovirus NOT DETECTED NOT DETECTED Final   Rhinovirus / Enterovirus NOT DETECTED NOT DETECTED Final   Influenza A NOT DETECTED NOT DETECTED Final   Influenza B NOT DETECTED NOT DETECTED Final   Parainfluenza Virus 1 NOT DETECTED NOT DETECTED Final   Parainfluenza Virus 2 NOT DETECTED NOT DETECTED Final   Parainfluenza Virus 3 NOT DETECTED NOT DETECTED Final   Parainfluenza Virus 4 NOT DETECTED NOT DETECTED Final   Respiratory Syncytial Virus NOT DETECTED NOT DETECTED Final   Bordetella pertussis NOT DETECTED NOT DETECTED Final   Bordetella Parapertussis NOT DETECTED NOT DETECTED Final   Chlamydophila pneumoniae NOT DETECTED NOT DETECTED Final   Mycoplasma pneumoniae NOT DETECTED NOT DETECTED Final  Comment: Performed at [facility], [address], [place], [place] [address]         Radiology Studies: US  RENAL Result Date: 12/09/2023 CLINICAL DATA:  Acute renal failure. EXAM: RENAL / URINARY TRACT ULTRASOUND COMPLETE COMPARISON:  March 17, 2016. FINDINGS: Right Kidney: Renal measurements: 9.9 x 5.6 x 4.6 cm = volume: 134 mL. Increased echogenicity of renal parenchyma is noted. 3.6 cm cyst is noted. No mass or hydronephrosis visualized. Left Kidney: Renal measurements: 8.2 x 5.2 x 4.2 cm = volume: 94 mL. Increased echogenicity of renal parenchyma is noted. 2.2 cm simple cyst is noted. No mass or hydronephrosis visualized. Bladder: Appears normal for degree of bladder distention. Other: None. IMPRESSION: Increased echogenicity of renal parenchyma is noted suggesting medical renal disease. No hydronephrosis or renal obstruction is noted. Electronically Signed   By: Rosalene Colon M.D.   On: 12/09/2023 16:00         Scheduled Meds:  amLODipine   5 mg Oral Daily   carvedilol   3.125 mg Oral BID WC   feeding supplement  1 Container Oral TID BM   folic acid   1 mg Oral Daily   guaiFENesin   1,200 mg Oral BID   isosorbide  mononitrate  60 mg Oral QAC lunch   pantoprazole  (PROTONIX ) IV  40 mg Intravenous BID AC   rosuvastatin   10 mg Oral Daily   Continuous Infusions:  sodium chloride  200 mL/hr (11/28/23 0928)   sodium chloride  50 mL/hr at 12/10/23 0410     LOS: 15 days        Alphonsus Jeans, MD [facility] Pager [phone]  If 7PM-7AM, please contact night-coverage [email or web address] 12/10/2023, 8:09 AM

## 2023-12-10 NOTE — Progress Notes (Signed)
 [facility]  ROUNDING NOTE   Subjective:   [name] was admitted to [facility] on 11/25/2023 for Upper GI bleed [K92.2] Pancytopenia (HCC) [D61.818] Gastrointestinal hemorrhage, unspecified gastrointestinal hemorrhage type [K92.2]  Patient continues to sit in chair Nursing states patient has remained in chair since yesterday Appears agitated due to discharge planning Completed breakfast tray at bedside Denies shortness of breath  Creatinine 3.73   Objective:  Vital signs in last 24 hours:  Temp:  [97.9 F (36.6 C)-98.4 F (36.9 C)] 98.4 F (36.9 C) (05/03 0841) Pulse Rate:  [50-73] 50 (05/03 0841) Resp:  [18-20] 18 (05/03 0841) BP: (119-159)/(61-92) 119/75 (05/03 0841) SpO2:  [97 %-100 %] 100 % (05/03 0841)  Weight change:  Filed Weights   11/25/23 2143 12/01/23 0502  Weight: 103.2 kg 99.6 kg    Intake/Output: I/O last 3 completed shifts: In: 721.2 [I.V.:721.2] Out: 800 [Urine:800]   Intake/Output this shift:  Total I/O In: -  Out: 250 [Urine:250]  Physical Exam: General: NAD  Head: Normocephalic, atraumatic. Moist oral mucosal membranes  Eyes: Anicteric  Lungs:  Clear to auscultation  Heart: Regular rate and rhythm  Abdomen:  Soft, nontender,   Extremities: Nonpitting dependent peripheral edema.  Neurologic: Alert and oriented to self, moving all four extremities  Skin: Ecchymoses noted  Access: none    Basic Metabolic Panel: Recent Labs  Lab 12/04/23 0501 12/05/23 0454 12/08/23 0931 12/09/23 0853 12/10/23 0849  NA 135 135 132* 132* 133*  K 4.1 4.1 4.5 4.3 4.3  CL 109 109 107 105 106  CO2 22 21* 19* 17* 14*  GLUCOSE 169* 187* 195* 204* 162*  BUN 41* 36* 51* 60* 56*  CREATININE 3.31* 3.04* 3.55* 3.98* 3.73*  CALCIUM  8.4* 8.4* 8.8* 8.8* 8.7*    Liver Function Tests: No results for input(s): "AST", "ALT", "ALKPHOS", "BILITOT", "PROT", "ALBUMIN" in the last 168 hours.  No results for input(s): "LIPASE", "AMYLASE" in the  last 168 hours. No results for input(s): "AMMONIA" in the last 168 hours.  CBC: Recent Labs  Lab 12/04/23 0807 12/05/23 0454 12/08/23 0931 12/09/23 0853 12/10/23 0925  WBC 9.6 8.6 9.0 11.2* 7.1  NEUTROABS  --  5.1  --   --   --   HGB 7.8* 7.9* 9.3* 7.8* 7.8*  HCT 22.7* 23.4* 27.7* 24.1* 23.7*  MCV 93.4 94.4 95.2 96.4 97.5  PLT 196 232 329 305 287    Cardiac Enzymes: No results for input(s): "CKTOTAL", "CKMB", "CKMBINDEX", "TROPONINI" in the last 168 hours.   BNP: Invalid input(s): "POCBNP"  CBG: No results for input(s): "GLUCAP" in the last 168 hours.   Microbiology: Results for orders placed or performed during the hospital encounter of 11/25/23  Respiratory (~20 pathogens) panel by PCR     Status: None   Collection Time: 12/02/23 11:00 AM   Specimen: Nasopharyngeal Swab; Respiratory  Result Value Ref Range Status   Adenovirus NOT DETECTED NOT DETECTED Final   Coronavirus 229E NOT DETECTED NOT DETECTED Final    Comment: (NOTE) The Coronavirus on the Respiratory Panel, DOES NOT test for the novel  Coronavirus (2019 nCoV)    Coronavirus HKU1 NOT DETECTED NOT DETECTED Final   Coronavirus NL63 NOT DETECTED NOT DETECTED Final   Coronavirus OC43 NOT DETECTED NOT DETECTED Final   Metapneumovirus NOT DETECTED NOT DETECTED Final   Rhinovirus / Enterovirus NOT DETECTED NOT DETECTED Final   Influenza A NOT DETECTED NOT DETECTED Final   Influenza B NOT DETECTED NOT DETECTED Final  Parainfluenza Virus 1 NOT DETECTED NOT DETECTED Final   Parainfluenza Virus 2 NOT DETECTED NOT DETECTED Final   Parainfluenza Virus 3 NOT DETECTED NOT DETECTED Final   Parainfluenza Virus 4 NOT DETECTED NOT DETECTED Final   Respiratory Syncytial Virus NOT DETECTED NOT DETECTED Final   Bordetella pertussis NOT DETECTED NOT DETECTED Final   Bordetella Parapertussis NOT DETECTED NOT DETECTED Final   Chlamydophila pneumoniae NOT DETECTED NOT DETECTED Final   Mycoplasma pneumoniae NOT DETECTED NOT  DETECTED Final    Comment: Performed at [facility], [address] [place], [place] [address]    Coagulation Studies: No results for input(s): "LABPROT", "INR" in the last 72 hours.   Urinalysis: No results for input(s): "COLORURINE", "LABSPEC", "PHURINE", "GLUCOSEU", "HGBUR", "BILIRUBINUR", "KETONESUR", "PROTEINUR", "UROBILINOGEN", "NITRITE", "LEUKOCYTESUR" in the last 72 hours.  Invalid input(s): "APPERANCEUR"    Imaging: US  RENAL Result Date: 12/09/2023 CLINICAL DATA:  Acute renal failure. EXAM: RENAL / URINARY TRACT ULTRASOUND COMPLETE COMPARISON:  March 17, 2016. FINDINGS: Right Kidney: Renal measurements: 9.9 x 5.6 x 4.6 cm = volume: 134 mL. Increased echogenicity of renal parenchyma is noted. 3.6 cm cyst is noted. No mass or hydronephrosis visualized. Left Kidney: Renal measurements: 8.2 x 5.2 x 4.2 cm = volume: 94 mL. Increased echogenicity of renal parenchyma is noted. 2.2 cm simple cyst is noted. No mass or hydronephrosis visualized. Bladder: Appears normal for degree of bladder distention. Other: None. IMPRESSION: Increased echogenicity of renal parenchyma is noted suggesting medical renal disease. No hydronephrosis or renal obstruction is noted. Electronically Signed   By: Rosalene Colon M.D.   On: 12/09/2023 16:00      Medications:    sodium chloride  200 mL/hr (11/28/23 0928)   sodium bicarbonate 150 mEq in dextrose  5 % 1,150 mL infusion 50 mL/hr at 12/10/23 1114    amLODipine   5 mg Oral Daily   carvedilol   3.125 mg Oral BID WC   feeding supplement  1 Container Oral TID BM   folic acid   1 mg Oral Daily   guaiFENesin   1,200 mg Oral BID   isosorbide  mononitrate  60 mg Oral QAC lunch   pantoprazole  (PROTONIX ) IV  40 mg Intravenous BID AC   rosuvastatin   10 mg Oral Daily   albuterol , dextrose , guaiFENesin -dextromethorphan , senna-docusate  Assessment/ Plan:  [name] is a 78 y.o.  male with diabetes mellitus type II, hypertension, glaucoma,  hyperlipidemia, T cell lymphoma, and rheumatoid arthritis who is admitted to [facility] on 11/25/2023 for Upper GI bleed [K92.2] Pancytopenia (HCC) [N56.213] Gastrointestinal hemorrhage, unspecified gastrointestinal hemorrhage type [K92.2]  Acute Kidney Injury on Chronic Kidney Disease stage IV: baseline creatinine of 3.8, GFR of 15 on 11/15/23.  Creatinine 2.6 on 09/23/2023. - holding losartan , dapagliflozin, and spironolactone .   - Renal ultrasound negative for obstruction - Creatinine has improved with IV fluids, 3.73 - Patient encouraged to maintain oral intake - Continue IV fluids for an additional day - Will schedule a follow-up with Dr. Lamount Pimple at discharge..  Hypertension with chronic kidney disease: Maintain the patient on amlodipine  and carvedilol . Blood pressure acceptable  Diabetes mellitus type II with chronic kidney disease:  - holding glipizide  - Primary team to continue management.  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 Status post mutliple transfusions.  - appreciate hematology and GI input - holding methotrexate . - Hemoglobin and platelets at goal  5.  Acute metabolic acidosis.  Serum bicarb 14.  Will change IV fluids to sodium  bicarbonate infusion at 50 mL/h.     LOS: 15 Daniel Kline 5/3/202512:09 PM

## 2023-12-11 DIAGNOSIS — K922 Gastrointestinal hemorrhage, unspecified: Secondary | ICD-10-CM | POA: Diagnosis not present

## 2023-12-11 LAB — CBC
HCT: 22.7 % — ABNORMAL LOW (ref 39.0–52.0)
Hemoglobin: 7.4 g/dL — ABNORMAL LOW (ref 13.0–17.0)
MCH: 31.1 pg (ref 26.0–34.0)
MCHC: 32.6 g/dL (ref 30.0–36.0)
MCV: 95.4 fL (ref 80.0–100.0)
Platelets: 271 10*3/uL (ref 150–400)
RBC: 2.38 MIL/uL — ABNORMAL LOW (ref 4.22–5.81)
RDW: 19.6 % — ABNORMAL HIGH (ref 11.5–15.5)
WBC: 6.9 10*3/uL (ref 4.0–10.5)
nRBC: 0.3 % — ABNORMAL HIGH (ref 0.0–0.2)

## 2023-12-11 LAB — BASIC METABOLIC PANEL WITH GFR
Anion gap: 11 (ref 5–15)
BUN: 59 mg/dL — ABNORMAL HIGH (ref 8–23)
CO2: 19 mmol/L — ABNORMAL LOW (ref 22–32)
Calcium: 8.4 mg/dL — ABNORMAL LOW (ref 8.9–10.3)
Chloride: 102 mmol/L (ref 98–111)
Creatinine, Ser: 3.75 mg/dL — ABNORMAL HIGH (ref 0.61–1.24)
GFR, Estimated: 16 mL/min — ABNORMAL LOW (ref >60)
Glucose, Bld: 210 mg/dL — ABNORMAL HIGH (ref 70–99)
Potassium: 3.7 mmol/L (ref 3.5–5.1)
Sodium: 132 mmol/L — ABNORMAL LOW (ref 135–145)

## 2023-12-11 NOTE — Progress Notes (Signed)
 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 Will need f/u outpatient w/ GI, Dr. Corky Diener, for biopsy. Continue on PPI   Acute blood loss anemia: secondary to above. S/p 1 unit of pRBCs transfused so far. Will transfuse if Hb < 7.0    AKI on CKDIV: baseline Cr on 3.8. Cr is labile.Holding aldactone , losartan , farxiga. Nephro following and recs apperc    Acute metabolic encephalopathy: etiology unclear. Mental status waxes and wanes. Ddx delirium vs dementia vs electrolyte abnormalities    DM2: well controlled, HbA1c 6.2.  Holding home glipizide . Diet controlled   HTN: continue on coreg , amlodipine , imdur    GERD: continue on PPI    Hyponatremia: resolved   Pancytopenia: in setting of methotrexate  use. S/p filgrastim  x 3 days. Holding methotrexate . Will f/u outpatient w/ heme. Resolved   Rheumatoid arthritis: holding methotrexate . Anti-cyclic citrullinated peptide antibody positive. Will need to f/u outpatient w/ rheum    Cutaneous T-cell lymphoma: will need to f/u outpatient w/ onco    Obesity: BMI 35.4. Class 2. Would benefit from weight loss   Generalized weakness:  PT/OT recs SNF. Insurance [employer] was never started as per CM 12/09/23 so it was started on 12/09/23. Pt's insurance denied SNF approval despite peer to peer. Pt's wife wants to file an appeal but doesn't know how, so messaged was made aware and waiting on a response       DVT prophylaxis: SCDs Code Status: full  Family Communication: Disposition Plan: unclear. Pt's insurance denied SNF approval despite peer to peer. Pt's wife wants to file an appeal but doesn't know how, so messaged was made aware and waiting on a response     Status is: Inpatient Remains inpatient appropriate because: medically stable. See above     Level of care: Telemetry Medical Consultants:  Nephro   Procedures:   Antimicrobials:    Subjective: Pt is lethargic.   Objective: Vitals:   12/10/23 0841 12/10/23 1625 12/10/23 2007 12/11/23 0359  BP: 119/75 130/60 128/72 114/66  Pulse: (!) 50 60 69 68  Resp: 18 18 20 20   Temp: 98.4 F (36.9 C) 98 F (36.7 C) 98.5 F (36.9 C) 98 F (36.7 C)  TempSrc:   Oral   SpO2: 100% 100% 99% 99%  Weight:      Height:        Intake/Output Summary (Last 24 hours) at 12/11/2023 0812 Last data filed at 12/11/2023 0121 Gross per 24 hour  Intake 3383.6 ml  Output 550 ml  Net 2833.6 ml    Filed Weights   11/25/23 2143 12/01/23 0502  Weight: 103.2 kg 99.6 kg    Examination:  General exam: appears lethargic   Respiratory  system: diminished breath sounds b/l  Cardiovascular system: S1 & S2+. No rubs or clicks  Gastrointestinal system: abd is soft, NT, obese & hypoactive bowel sounds  Central nervous system: lethargic. Moves all extremities  Psychiatry: judgement and insight appears poor    Data Reviewed: I have personally reviewed following labs and imaging studies  CBC: Recent Labs  Lab 12/05/23 0454 12/08/23 0931 12/09/23 0853 12/10/23 0925  WBC 8.6 9.0 11.2* 7.1  NEUTROABS 5.1  --   --   --   HGB 7.9* 9.3* 7.8* 7.8*  HCT 23.4* 27.7* 24.1* 23.7*   MCV 94.4 95.2 96.4 97.5  PLT 232 329 305 287   Basic Metabolic Panel: Recent Labs  Lab 12/05/23 0454 12/08/23 0931 12/09/23 0853 12/10/23 0849  NA 135 132* 132* 133*  K 4.1 4.5 4.3 4.3  CL 109 107 105 106  CO2 21* 19* 17* 14*  GLUCOSE 187* 195* 204* 162*  BUN 36* 51* 60* 56*  CREATININE 3.04* 3.55* 3.98* 3.73*  CALCIUM  8.4* 8.8* 8.8* 8.7*   GFR: Estimated Creatinine Clearance: 17.7 mL/min (A) (by C-G formula based on SCr of 3.73 mg/dL (H)). Liver Function Tests: No results for input(s): "AST", "ALT", "ALKPHOS", "BILITOT", "PROT", "ALBUMIN" in the last 168 hours. No results for input(s): "LIPASE", "AMYLASE" in the last 168 hours. No results for input(s): "AMMONIA" in the last 168 hours. Coagulation Profile: No results for input(s): "INR", "PROTIME" in the last 168 hours. Cardiac Enzymes: No results for input(s): "CKTOTAL", "CKMB", "CKMBINDEX", "TROPONINI" in the last 168 hours. BNP (last 3 results) No results for input(s): "PROBNP" in the last 8760 hours. HbA1C: No results for input(s): "HGBA1C" in the last 72 hours. CBG: No results for input(s): "GLUCAP" in the last 168 hours.  Lipid Profile: No results for input(s): "CHOL", "HDL", "LDLCALC", "TRIG", "CHOLHDL", "LDLDIRECT" in the last 72 hours. Thyroid  Function Tests: No results for input(s): "TSH", "T4TOTAL", "FREET4", "T3FREE", "THYROIDAB" in the last 72 hours. Anemia Panel: No results for input(s): "VITAMINB12", "FOLATE", "FERRITIN", "TIBC", "IRON", "RETICCTPCT" in the last 72 hours. Sepsis Labs: No results for input(s): "PROCALCITON", "LATICACIDVEN" in the last 168 hours.  Recent Results (from the past 240 hours)  Respiratory (~20 pathogens) panel by PCR     Status: None   Collection Time: 12/02/23 11:00 AM   Specimen: Nasopharyngeal Swab; Respiratory  Result Value Ref Range Status   Adenovirus NOT DETECTED NOT DETECTED Final   Coronavirus 229E NOT DETECTED NOT DETECTED Final    Comment: (NOTE) The  Coronavirus on the Respiratory Panel, DOES NOT test for the novel  Coronavirus (2019 nCoV)    Coronavirus HKU1 NOT DETECTED NOT DETECTED Final   Coronavirus NL63 NOT DETECTED NOT DETECTED Final   Coronavirus OC43 NOT DETECTED NOT DETECTED Final   Metapneumovirus NOT DETECTED NOT DETECTED Final   Rhinovirus / Enterovirus NOT DETECTED NOT DETECTED Final   Influenza A NOT DETECTED NOT DETECTED Final   Influenza B NOT DETECTED NOT DETECTED Final   Parainfluenza Virus 1 NOT DETECTED NOT DETECTED Final   Parainfluenza Virus 2 NOT DETECTED NOT DETECTED Final   Parainfluenza Virus 3 NOT DETECTED NOT DETECTED Final   Parainfluenza Virus 4 NOT DETECTED NOT DETECTED Final   Respiratory Syncytial Virus NOT DETECTED NOT DETECTED Final   Bordetella pertussis NOT DETECTED NOT DETECTED Final   Bordetella Parapertussis NOT DETECTED NOT DETECTED Final   Chlamydophila pneumoniae NOT DETECTED NOT DETECTED Final   Mycoplasma pneumoniae NOT DETECTED NOT DETECTED Final    Comment: Performed at  [name], [address] [place] [place], [place] [address]         Radiology Studies: US  RENAL Result Date: 12/09/2023 CLINICAL DATA:  Acute renal failure. EXAM: RENAL / URINARY TRACT ULTRASOUND COMPLETE COMPARISON:  March 17, 2016. FINDINGS: Right Kidney: Renal measurements: 9.9 x 5.6 x 4.6 cm = volume: 134 mL. Increased echogenicity of renal parenchyma is noted. 3.6 cm cyst is noted. No mass or hydronephrosis visualized. Left Kidney: Renal measurements: 8.2 x 5.2 x 4.2 cm = volume: 94 mL. Increased echogenicity of renal parenchyma is noted. 2.2 cm simple cyst is noted. No mass or hydronephrosis visualized. Bladder: Appears normal for degree of bladder distention. Other: None. IMPRESSION: Increased echogenicity of renal parenchyma is noted suggesting medical renal disease. No hydronephrosis or renal obstruction is noted. Electronically Signed   By: Rosalene Colon M.D.   On: 12/09/2023 16:00        Scheduled  Meds:  amLODipine   5 mg Oral Daily   carvedilol   3.125 mg Oral BID WC   feeding supplement  1 Container Oral TID BM   folic acid   1 mg Oral Daily   guaiFENesin   1,200 mg Oral BID   isosorbide  mononitrate  60 mg Oral QAC lunch   pantoprazole  (PROTONIX ) IV  40 mg Intravenous BID AC   rosuvastatin   10 mg Oral Daily   Continuous Infusions:  sodium chloride  200 mL/hr (12/11/23 0121)   sodium bicarbonate 150 mEq in dextrose  5 % 1,150 mL infusion 50 mL/hr at 12/11/23 0121     LOS: 16 days        Alphonsus Jeans, MD [facility] Pager [phone]  If 7PM-7AM, please contact night-coverage [email or web address] 12/11/2023, 8:12 AM

## 2023-12-11 NOTE — TOC Progression Note (Addendum)
 Transition of Care [facility]) - Progression Note    Patient Details  Name: Daniel Kline MRN: [id number] Date of Birth: 10/11/1945  [facility] CM/SW Contact  Marino Sias, RN Phone Number: 12/11/2023, 2:58 PM  Clinical Narrative: Per MD patient's daughter want contact information to start the appeal process. [employer] called, left message will contact daughter when information is available.  3:13 pm: [employer] called back Wife [name] is to call [phone] to request an appeal. [name] given information will call now.   Expected Discharge Plan: Home w Home Health Services Barriers to Discharge: Continued Medical Work up  Expected Discharge Plan and Services       Living arrangements for the past 2 months: Single Family Home                                       Social Determinants of Health (SDOH) Interventions SDOH Screenings   Food Insecurity: No Food Insecurity (11/26/2023)  Housing: Low Risk  (11/26/2023)  Transportation Needs: No Transportation Needs (11/26/2023)  Utilities: Not At Risk (11/26/2023)  Depression (PHQ2-9): Low Risk  (10/31/2023)  Financial Resource Strain: Low Risk  (07/20/2017)  Social Connections: Moderately Isolated (11/26/2023)  Tobacco Use: Medium Risk (11/25/2023)    Readmission Risk Interventions    11/30/2023    2:01 PM 09/06/2022    3:47 PM 01/27/2022   12:27 PM  Readmission Risk Prevention Plan  Transportation Screening Complete Complete Complete  PCP or Specialist Appt within 3-5 Days   Complete  HRI or Home Care Consult   Complete  Social Work Consult for Recovery Care Planning/Counseling   Complete  Palliative Care Screening   Complete  Medication Review Oceanographer) Complete Complete Complete  PCP or Specialist appointment within 3-5 days of discharge Complete    HRI or Home Care Consult Complete    SW Recovery Care/Counseling Consult Complete Complete   Palliative Care Screening Not Applicable Not Applicable   Skilled  Nursing Facility Not Applicable

## 2023-12-12 ENCOUNTER — Other Ambulatory Visit: Payer: Self-pay

## 2023-12-12 ENCOUNTER — Telehealth: Payer: Self-pay

## 2023-12-12 DIAGNOSIS — K922 Gastrointestinal hemorrhage, unspecified: Secondary | ICD-10-CM | POA: Diagnosis not present

## 2023-12-12 LAB — BASIC METABOLIC PANEL WITH GFR
Anion gap: 12 (ref 5–15)
BUN: 61 mg/dL — ABNORMAL HIGH (ref 8–23)
CO2: 19 mmol/L — ABNORMAL LOW (ref 22–32)
Calcium: 8.4 mg/dL — ABNORMAL LOW (ref 8.9–10.3)
Chloride: 102 mmol/L (ref 98–111)
Creatinine, Ser: 3.72 mg/dL — ABNORMAL HIGH (ref 0.61–1.24)
GFR, Estimated: 16 mL/min — ABNORMAL LOW (ref >60)
Glucose, Bld: 205 mg/dL — ABNORMAL HIGH (ref 70–99)
Potassium: 4 mmol/L (ref 3.5–5.1)
Sodium: 133 mmol/L — ABNORMAL LOW (ref 135–145)

## 2023-12-12 LAB — CBC
HCT: 23 % — ABNORMAL LOW (ref 39.0–52.0)
Hemoglobin: 7.6 g/dL — ABNORMAL LOW (ref 13.0–17.0)
MCH: 31.4 pg (ref 26.0–34.0)
MCHC: 33 g/dL (ref 30.0–36.0)
MCV: 95 fL (ref 80.0–100.0)
Platelets: 267 10*3/uL (ref 150–400)
RBC: 2.42 MIL/uL — ABNORMAL LOW (ref 4.22–5.81)
RDW: 19.1 % — ABNORMAL HIGH (ref 11.5–15.5)
WBC: 6.5 10*3/uL (ref 4.0–10.5)
nRBC: 0 % (ref 0.0–0.2)

## 2023-12-12 LAB — ANCA PROFILE
Anti-MPO Antibodies: <0.2 U (ref 0.0–0.9)
Anti-PR3 Antibodies: <0.2 U (ref 0.0–0.9)
Atypical P-ANCA titer: <1:20 {titer}
C-ANCA: <1:20 {titer}
P-ANCA: <1:20 {titer}

## 2023-12-12 LAB — GLOMERULAR BASEMENT MEMBRANE ANTIBODIES: GBM Ab: <0.2 U (ref 0.0–0.9)

## 2023-12-12 LAB — C4 COMPLEMENT: Complement C4, Body Fluid: 34 mg/dL (ref 12–38)

## 2023-12-12 LAB — C3 COMPLEMENT: C3 Complement: 130 mg/dL (ref 82–167)

## 2023-12-12 MED ORDER — PANTOPRAZOLE SODIUM 40 MG PO TBEC
40.0000 mg | DELAYED_RELEASE_TABLET | Freq: Two times a day (BID) | ORAL | 0 refills | Status: DC
  Filled 2023-12-12: qty 60, 30d supply, fill #0

## 2023-12-12 NOTE — Telephone Encounter (Signed)
 Helaine Llanos, MD  Franne Ivory, CMA Please call his wife and let her know I heard he is going to have to go home--rehab didn't get approved. TOC team should contact her
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
## 2023-12-12 NOTE — Progress Notes (Signed)
 [facility]  ROUNDING NOTE   Subjective:   Daniel Kline was admitted to [facility] on 11/25/2023 for Upper GI bleed [K92.2] Pancytopenia [facility]) [Z61.096] Gastrointestinal hemorrhage, unspecified gastrointestinal hemorrhage type [K92.2]  Patient sitting up in chair Alert Frustrated with food Frustrated with extended stay in dialysis.   Creatinine 3.72   Objective:  Vital signs in last 24 hours:  Temp:  [98.2 F (36.8 C)-98.9 F (37.2 C)] 98.9 F (37.2 C) (05/05 0302) Pulse Rate:  [68-75] 68 (05/05 0302) Resp:  [20] 20 (05/05 0302) BP: (122-132)/(61-64) 132/64 (05/05 0302) SpO2:  [99 %] 99 % (05/05 0302)  Weight change:  Filed Weights   11/25/23 2143 12/01/23 0502  Weight: 103.2 kg 99.6 kg    Intake/Output: I/O last 3 completed shifts: In: 8509.2 [P.O.:120; I.V.:8389.2] Out: 300 [Urine:300]   Intake/Output this shift:  No intake/output data recorded.  Physical Exam: General: NAD  Head: Normocephalic, atraumatic. Moist oral mucosal membranes  Eyes: Anicteric  Lungs:  Clear to auscultation  Heart: Regular rate and rhythm  Abdomen:  Soft, nontender  Extremities: Nonpitting dependent peripheral edema.  Neurologic: Alert and oriented to self, moving all four extremities  Skin: Ecchymoses noted  Access: none    Basic Metabolic Panel: Recent Labs  Lab 12/08/23 0931 12/09/23 0853 12/10/23 0849 12/11/23 0855 12/12/23 0512  NA 132* 132* 133* 132* 133*  K 4.5 4.3 4.3 3.7 4.0  CL 107 105 106 102 102  CO2 19* 17* 14* 19* 19*  GLUCOSE 195* 204* 162* 210* 205*  BUN 51* 60* 56* 59* 61*  CREATININE 3.55* 3.98* 3.73* 3.75* 3.72*  CALCIUM  8.8* 8.8* 8.7* 8.4* 8.4*    Liver Function Tests: No results for input(s): "AST", "ALT", "ALKPHOS", "BILITOT", "PROT", "ALBUMIN" in the last 168 hours.  No results for input(s): "LIPASE", "AMYLASE" in the last 168 hours. No results for input(s): "AMMONIA" in the last 168 hours.  CBC: Recent Labs  Lab  12/08/23 0931 12/09/23 0853 12/10/23 0925 12/11/23 0855 12/12/23 0512  WBC 9.0 11.2* 7.1 6.9 6.5  HGB 9.3* 7.8* 7.8* 7.4* 7.6*  HCT 27.7* 24.1* 23.7* 22.7* 23.0*  MCV 95.2 96.4 97.5 95.4 95.0  PLT 329 305 287 271 267    Cardiac Enzymes: No results for input(s): "CKTOTAL", "CKMB", "CKMBINDEX", "TROPONINI" in the last 168 hours.   BNP: Invalid input(s): "POCBNP"  CBG: No results for input(s): "GLUCAP" in the last 168 hours.   Microbiology: Results for orders placed or performed during the hospital encounter of 11/25/23  Respiratory (~20 pathogens) panel by PCR     Status: None   Collection Time: 12/02/23 11:00 AM   Specimen: Nasopharyngeal Swab; Respiratory  Result Value Ref Range Status   Adenovirus NOT DETECTED NOT DETECTED Final   Coronavirus 229E NOT DETECTED NOT DETECTED Final    Comment: (NOTE) The Coronavirus on the Respiratory Panel, DOES NOT test for the novel  Coronavirus (2019 nCoV)    Coronavirus HKU1 NOT DETECTED NOT DETECTED Final   Coronavirus NL63 NOT DETECTED NOT DETECTED Final   Coronavirus OC43 NOT DETECTED NOT DETECTED Final   Metapneumovirus NOT DETECTED NOT DETECTED Final   Rhinovirus / Enterovirus NOT DETECTED NOT DETECTED Final   Influenza A NOT DETECTED NOT DETECTED Final   Influenza B NOT DETECTED NOT DETECTED Final   Parainfluenza Virus 1 NOT DETECTED NOT DETECTED Final   Parainfluenza Virus 2 NOT DETECTED NOT DETECTED Final   Parainfluenza Virus 3 NOT DETECTED NOT DETECTED Final   Parainfluenza Virus 4  NOT DETECTED NOT DETECTED Final   Respiratory Syncytial Virus NOT DETECTED NOT DETECTED Final   Bordetella pertussis NOT DETECTED NOT DETECTED Final   Bordetella Parapertussis NOT DETECTED NOT DETECTED Final   Chlamydophila pneumoniae NOT DETECTED NOT DETECTED Final   Mycoplasma pneumoniae NOT DETECTED NOT DETECTED Final    Comment: Performed at [facility], [address], [place], [place] [address]    Coagulation Studies: No  results for input(s): "LABPROT", "INR" in the last 72 hours.   Urinalysis: No results for input(s): "COLORURINE", "LABSPEC", "PHURINE", "GLUCOSEU", "HGBUR", "BILIRUBINUR", "KETONESUR", "PROTEINUR", "UROBILINOGEN", "NITRITE", "LEUKOCYTESUR" in the last 72 hours.  Invalid input(s): "APPERANCEUR"    Imaging: No results found.     Medications:    sodium chloride  200 mL/hr (12/12/23 0515)    amLODipine   5 mg Oral Daily   carvedilol   3.125 mg Oral BID WC   feeding supplement  1 Container Oral TID BM   folic acid   1 mg Oral Daily   guaiFENesin   1,200 mg Oral BID   isosorbide  mononitrate  60 mg Oral QAC lunch   pantoprazole  (PROTONIX ) IV  40 mg Intravenous BID AC   rosuvastatin   10 mg Oral Daily   albuterol , dextrose , guaiFENesin -dextromethorphan , senna-docusate  Assessment/ Plan:  Daniel Kline is a 78 y.o.  male with diabetes mellitus type II, hypertension, glaucoma, hyperlipidemia, T cell lymphoma, and rheumatoid arthritis who is admitted to [facility] on 11/25/2023 for Upper GI bleed [K92.2] Pancytopenia (HCC) [Z30.865] Gastrointestinal hemorrhage, unspecified gastrointestinal hemorrhage type [K92.2]  Acute Kidney Injury on Chronic Kidney Disease stage IV: baseline creatinine of 3.8, GFR of 15 on 11/15/23.  Creatinine 2.6 on 09/23/2023. - holding losartan , dapagliflozin, and spironolactone .   - Renal ultrasound negative for obstruction - Creatinine stable today, 3.72 - Will schedule a follow-up with Dr. Lamount Pimple at discharge.. - Continue to hold spironolactone , losartan , dapagliflozin until seen in office.   Hypertension with chronic kidney disease: Maintain the patient on amlodipine  and carvedilol . Blood pressure acceptable  Diabetes mellitus type II with chronic kidney disease:  - holding glipizide  - Primary team to continue management.  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 Status post mutliple transfusions.  - appreciate  hematology and GI input - holding methotrexate . - Hemoglobin and platelets at goal  5.  Acute metabolic acidosis.  Serum bicarb corrected.     LOS: 17 Jeno Calleros 5/5/20253:13 PM

## 2023-12-12 NOTE — TOC Transition Note (Signed)
 Transition of Care [facility]) - Discharge Note   Patient Details  Name: Daniel Kline MRN: [id number] Date of Birth: June 03, 1946  Transition of Care [facility]) CM/SW Contact:  Odilia Bennett, LCSW Phone Number: 12/12/2023, 1:35 PM   Clinical Narrative:   Patient has orders to discharge home today. CSW called wife. She is agreeable to home health and prefers [facility]. Patient was discharged from their services in April. They have accepted the referral for PT, OT, RN. Wife declined 3-in-1. No further concerns. Wife will transport patient home. CSW signing off.  Final next level of care: Home w Home Health Services Barriers to Discharge: Barriers Resolved   Patient Goals and CMS Choice Patient states their goals for this hospitalization and ongoing recovery are:: To return home   Choice offered to / list presented to : Spouse      Discharge Placement                Patient to be transferred to facility by: Wife Name of family member notified: [name] Patient and family notified of of transfer: 12/12/23  Discharge Plan and Services Additional resources added to the After Visit Summary for                            [facility] Arranged: RN, PT, OT [facility] Agency: [facility] Date [employer] Agency Contacted: 12/12/23   Representative spoke with at [facility] Agency: Shaun  Social Drivers of Health (SDOH) Interventions SDOH Screenings   Food Insecurity: No Food Insecurity (11/26/2023)  Housing: Low Risk  (11/26/2023)  Transportation Needs: No Transportation Needs (11/26/2023)  Utilities: Not At Risk (11/26/2023)  Depression (PHQ2-9): Low Risk  (10/31/2023)  Financial Resource Strain: Low Risk  (07/20/2017)  Social Connections: Moderately Isolated (11/26/2023)  Tobacco Use: Medium Risk (11/25/2023)     Readmission Risk Interventions    11/30/2023    2:01 PM 09/06/2022    3:47 PM 01/27/2022   12:27 PM  Readmission Risk Prevention Plan  Transportation Screening Complete  Complete Complete  PCP or Specialist Appt within 3-5 Days   Complete  HRI or Home Care Consult   Complete  Social Work Consult for Recovery Care Planning/Counseling   Complete  Palliative Care Screening   Complete  Medication Review Oceanographer) Complete Complete Complete  PCP or Specialist appointment within 3-5 days of discharge Complete    HRI or Home Care Consult Complete    SW Recovery Care/Counseling Consult Complete Complete   Palliative Care Screening Not Applicable Not Applicable   Skilled Nursing Facility Not Applicable

## 2023-12-12 NOTE — Telephone Encounter (Signed)
 Spoke to  pt's wife .

## 2023-12-13 ENCOUNTER — Telehealth: Payer: Self-pay | Admitting: Internal Medicine

## 2023-12-13 ENCOUNTER — Telehealth: Payer: Self-pay

## 2023-12-13 ENCOUNTER — Other Ambulatory Visit: Payer: Self-pay

## 2023-12-13 DIAGNOSIS — K922 Gastrointestinal hemorrhage, unspecified: Secondary | ICD-10-CM | POA: Diagnosis not present

## 2023-12-13 DIAGNOSIS — I5032 Chronic diastolic (congestive) heart failure: Secondary | ICD-10-CM | POA: Diagnosis not present

## 2023-12-13 DIAGNOSIS — Z85828 Personal history of other malignant neoplasm of skin: Secondary | ICD-10-CM | POA: Diagnosis not present

## 2023-12-13 DIAGNOSIS — D631 Anemia in chronic kidney disease: Secondary | ICD-10-CM | POA: Diagnosis not present

## 2023-12-13 DIAGNOSIS — E785 Hyperlipidemia, unspecified: Secondary | ICD-10-CM | POA: Diagnosis not present

## 2023-12-13 DIAGNOSIS — G8929 Other chronic pain: Secondary | ICD-10-CM | POA: Diagnosis not present

## 2023-12-13 DIAGNOSIS — G4733 Obstructive sleep apnea (adult) (pediatric): Secondary | ICD-10-CM | POA: Diagnosis not present

## 2023-12-13 DIAGNOSIS — H409 Unspecified glaucoma: Secondary | ICD-10-CM | POA: Diagnosis not present

## 2023-12-13 DIAGNOSIS — Z96643 Presence of artificial hip joint, bilateral: Secondary | ICD-10-CM | POA: Diagnosis not present

## 2023-12-13 DIAGNOSIS — I4892 Unspecified atrial flutter: Secondary | ICD-10-CM | POA: Diagnosis not present

## 2023-12-13 DIAGNOSIS — Z556 Problems related to health literacy: Secondary | ICD-10-CM | POA: Diagnosis not present

## 2023-12-13 DIAGNOSIS — K8020 Calculus of gallbladder without cholecystitis without obstruction: Secondary | ICD-10-CM | POA: Diagnosis not present

## 2023-12-13 DIAGNOSIS — Z8572 Personal history of non-Hodgkin lymphomas: Secondary | ICD-10-CM | POA: Diagnosis not present

## 2023-12-13 DIAGNOSIS — Z96651 Presence of right artificial knee joint: Secondary | ICD-10-CM | POA: Diagnosis not present

## 2023-12-13 DIAGNOSIS — N183 Chronic kidney disease, stage 3 unspecified: Secondary | ICD-10-CM | POA: Diagnosis not present

## 2023-12-13 DIAGNOSIS — N179 Acute kidney failure, unspecified: Secondary | ICD-10-CM | POA: Diagnosis not present

## 2023-12-13 DIAGNOSIS — E1122 Type 2 diabetes mellitus with diabetic chronic kidney disease: Secondary | ICD-10-CM | POA: Diagnosis not present

## 2023-12-13 DIAGNOSIS — I491 Atrial premature depolarization: Secondary | ICD-10-CM | POA: Diagnosis not present

## 2023-12-13 DIAGNOSIS — Z7984 Long term (current) use of oral hypoglycemic drugs: Secondary | ICD-10-CM | POA: Diagnosis not present

## 2023-12-13 DIAGNOSIS — I130 Hypertensive heart and chronic kidney disease with heart failure and stage 1 through stage 4 chronic kidney disease, or unspecified chronic kidney disease: Secondary | ICD-10-CM | POA: Diagnosis not present

## 2023-12-13 DIAGNOSIS — I480 Paroxysmal atrial fibrillation: Secondary | ICD-10-CM | POA: Diagnosis not present

## 2023-12-13 LAB — MULTIPLE MYELOMA PANEL, SERUM
Albumin SerPl Elph-Mcnc: 3 g/dL (ref 2.9–4.4)
Albumin/Glob SerPl: 0.9 (ref 0.7–1.7)
Alpha 1: 0.3 g/dL (ref 0.0–0.4)
Alpha2 Glob SerPl Elph-Mcnc: 0.6 g/dL (ref 0.4–1.0)
B-Globulin SerPl Elph-Mcnc: 1.1 g/dL (ref 0.7–1.3)
Gamma Glob SerPl Elph-Mcnc: 1.6 g/dL (ref 0.4–1.8)
Globulin, Total: 3.6 g/dL (ref 2.2–3.9)
IgA: 586 mg/dL — ABNORMAL HIGH (ref 61–437)
IgG (Immunoglobin G), Serum: 1456 mg/dL (ref 603–1613)
IgM (Immunoglobulin M), Srm: 42 mg/dL (ref 15–143)
Total Protein ELP: 6.6 g/dL (ref 6.0–8.5)

## 2023-12-13 NOTE — Transitions of Care (Post Inpatient/ED Visit) (Signed)
   12/13/2023  Name: Daniel Kline MRN: [id number] DOB: August 26, 1945  Today's TOC FU Call Status: Today's TOC FU Call Status:: Unsuccessful Call (1st Attempt) Unsuccessful Call (1st Attempt) Date: 12/13/23  Attempted to reach the patient regarding the most recent Inpatient/ED visit.  Follow Up Plan: Additional outreach attempts will be made to reach the patient to complete the Transitions of Care (Post Inpatient/ED visit) call.   Brown Cape, RN, BSN, CCM [facility], [facility] RN Care Manager Direct Dial: [phone]

## 2023-12-13 NOTE — Telephone Encounter (Signed)
 Thank you. She let us  know yesterday and we have received requests for orders already.

## 2023-12-13 NOTE — Telephone Encounter (Signed)
 Copied from CRM [id number]. Topic: Clinical - Home Health Verbal Orders >> Dec 13, 2023  2:11 PM Martinique E wrote: Caller/Agency: Angie, PT with [facility] Callback Number: [phone] Service Requested: Physical Therapy Frequency: 1x / week for one week, 2x / week for 4 weeks, then 1x / week for 4 weeks. Any new concerns about the patient? Yes -  1. Patient has a bandage on left leg and he did not let Angie remove this bandage to look at it, and wound care orders were not in the discharge paperwork. 2. Patient got taken off of Furosemide  at the hospital and wanted to make PCP aware. 3. Patient has edema on both legs, but it was 2+ on right leg.

## 2023-12-13 NOTE — Telephone Encounter (Signed)
 Copied from CRM [id number]. Topic: General - Other >> Dec 13, 2023  3:31 PM Traseandia E wrote: Reason for CRM:  Pt wife doris call to relay message that Pt is home from home health , starting 12/12/2023

## 2023-12-14 ENCOUNTER — Telehealth: Payer: Self-pay

## 2023-12-14 DIAGNOSIS — G8929 Other chronic pain: Secondary | ICD-10-CM | POA: Diagnosis not present

## 2023-12-14 DIAGNOSIS — E1122 Type 2 diabetes mellitus with diabetic chronic kidney disease: Secondary | ICD-10-CM | POA: Diagnosis not present

## 2023-12-14 DIAGNOSIS — Z85828 Personal history of other malignant neoplasm of skin: Secondary | ICD-10-CM | POA: Diagnosis not present

## 2023-12-14 DIAGNOSIS — Z96651 Presence of right artificial knee joint: Secondary | ICD-10-CM | POA: Diagnosis not present

## 2023-12-14 DIAGNOSIS — Z7984 Long term (current) use of oral hypoglycemic drugs: Secondary | ICD-10-CM | POA: Diagnosis not present

## 2023-12-14 DIAGNOSIS — Z556 Problems related to health literacy: Secondary | ICD-10-CM | POA: Diagnosis not present

## 2023-12-14 DIAGNOSIS — N179 Acute kidney failure, unspecified: Secondary | ICD-10-CM | POA: Diagnosis not present

## 2023-12-14 DIAGNOSIS — I491 Atrial premature depolarization: Secondary | ICD-10-CM | POA: Diagnosis not present

## 2023-12-14 DIAGNOSIS — Z8572 Personal history of non-Hodgkin lymphomas: Secondary | ICD-10-CM | POA: Diagnosis not present

## 2023-12-14 DIAGNOSIS — K922 Gastrointestinal hemorrhage, unspecified: Secondary | ICD-10-CM | POA: Diagnosis not present

## 2023-12-14 DIAGNOSIS — H409 Unspecified glaucoma: Secondary | ICD-10-CM | POA: Diagnosis not present

## 2023-12-14 DIAGNOSIS — E785 Hyperlipidemia, unspecified: Secondary | ICD-10-CM | POA: Diagnosis not present

## 2023-12-14 DIAGNOSIS — Z96643 Presence of artificial hip joint, bilateral: Secondary | ICD-10-CM | POA: Diagnosis not present

## 2023-12-14 DIAGNOSIS — D631 Anemia in chronic kidney disease: Secondary | ICD-10-CM | POA: Diagnosis not present

## 2023-12-14 DIAGNOSIS — N183 Chronic kidney disease, stage 3 unspecified: Secondary | ICD-10-CM | POA: Diagnosis not present

## 2023-12-14 DIAGNOSIS — G4733 Obstructive sleep apnea (adult) (pediatric): Secondary | ICD-10-CM | POA: Diagnosis not present

## 2023-12-14 DIAGNOSIS — I4892 Unspecified atrial flutter: Secondary | ICD-10-CM | POA: Diagnosis not present

## 2023-12-14 DIAGNOSIS — I130 Hypertensive heart and chronic kidney disease with heart failure and stage 1 through stage 4 chronic kidney disease, or unspecified chronic kidney disease: Secondary | ICD-10-CM | POA: Diagnosis not present

## 2023-12-14 DIAGNOSIS — I5032 Chronic diastolic (congestive) heart failure: Secondary | ICD-10-CM | POA: Diagnosis not present

## 2023-12-14 DIAGNOSIS — I480 Paroxysmal atrial fibrillation: Secondary | ICD-10-CM | POA: Diagnosis not present

## 2023-12-14 DIAGNOSIS — K8020 Calculus of gallbladder without cholecystitis without obstruction: Secondary | ICD-10-CM | POA: Diagnosis not present

## 2023-12-14 NOTE — Telephone Encounter (Signed)
 Spoke to [employer]. She said his cognitive status was way worse. Said he was not acting like he usually does. Was disagreeing with stay they said.   Tried to call pt to see about coming in tomorrow. He does have an appt on 12-19-23.

## 2023-12-14 NOTE — Transitions of Care (Post Inpatient/ED Visit) (Signed)
   12/14/2023  Name: Daniel Kline MRN: [id number] DOB: 04-Mar-1946  Today's TOC FU Call Status: Today's TOC FU Call Status:: Unsuccessful Call (2nd Attempt) Unsuccessful Call (2nd Attempt) Date: 12/14/23  Attempted to reach the patient regarding the most recent Inpatient/ED visit. Left a HIPAA approved voicemail message to phone number provided requesting a return call.  Follow Up Plan: Additional outreach attempts will be made to reach the patient to complete the Transitions of Care (Post Inpatient/ED visit) call.   Brown Cape, RN, BSN, CCM [facility], [facility] RN Care Manager Direct Dial: [phone]

## 2023-12-15 ENCOUNTER — Ambulatory Visit: Admitting: Internal Medicine

## 2023-12-15 ENCOUNTER — Telehealth: Payer: Self-pay

## 2023-12-15 ENCOUNTER — Encounter: Payer: Self-pay | Admitting: Internal Medicine

## 2023-12-15 ENCOUNTER — Telehealth: Payer: Self-pay | Admitting: *Deleted

## 2023-12-15 VITALS — BP 110/80 | HR 70 | Temp 98.7°F | Ht 66.0 in | Wt 231.0 lb

## 2023-12-15 DIAGNOSIS — K922 Gastrointestinal hemorrhage, unspecified: Secondary | ICD-10-CM

## 2023-12-15 DIAGNOSIS — N179 Acute kidney failure, unspecified: Secondary | ICD-10-CM

## 2023-12-15 DIAGNOSIS — I480 Paroxysmal atrial fibrillation: Secondary | ICD-10-CM

## 2023-12-15 DIAGNOSIS — I872 Venous insufficiency (chronic) (peripheral): Secondary | ICD-10-CM

## 2023-12-15 DIAGNOSIS — M0600 Rheumatoid arthritis without rheumatoid factor, unspecified site: Secondary | ICD-10-CM

## 2023-12-15 DIAGNOSIS — L97921 Non-pressure chronic ulcer of unspecified part of left lower leg limited to breakdown of skin: Secondary | ICD-10-CM

## 2023-12-15 DIAGNOSIS — R410 Disorientation, unspecified: Secondary | ICD-10-CM

## 2023-12-15 LAB — RENAL FUNCTION PANEL
Albumin: 3 g/dL — ABNORMAL LOW (ref 3.5–5.2)
BUN: 49 mg/dL — ABNORMAL HIGH (ref 6–23)
CO2: 22 meq/L (ref 19–32)
Calcium: 7.7 mg/dL — ABNORMAL LOW (ref 8.4–10.5)
Chloride: 108 meq/L (ref 96–112)
Creatinine, Ser: 3.52 mg/dL — ABNORMAL HIGH (ref 0.40–1.50)
GFR: 15.84 mL/min — ABNORMAL LOW (ref >60.00)
Glucose, Bld: 177 mg/dL — ABNORMAL HIGH (ref 70–99)
Phosphorus: 4 mg/dL (ref 2.3–4.6)
Potassium: 4.1 meq/L (ref 3.5–5.1)
Sodium: 136 meq/L (ref 135–145)

## 2023-12-15 LAB — CBC
HCT: 21.2 % — CL (ref 39.0–52.0)
Hemoglobin: 7.1 g/dL — CL (ref 13.0–17.0)
MCHC: 33.4 g/dL (ref 30.0–36.0)
MCV: 97.2 fl (ref 78.0–100.0)
Platelets: 200 10*3/uL (ref 150.0–400.0)
RBC: 2.18 Mil/uL — ABNORMAL LOW (ref 4.22–5.81)
RDW: 20.8 % — ABNORMAL HIGH (ref 11.5–15.5)
WBC: 4.9 10*3/uL (ref 4.0–10.5)

## 2023-12-15 MED ORDER — HYDROCODONE-ACETAMINOPHEN 5-325 MG PO TABS
1.0000 | ORAL_TABLET | Freq: Four times a day (QID) | ORAL | 0 refills | Status: AC | PRN
Start: 2023-12-15 — End: ?

## 2023-12-15 NOTE — Assessment & Plan Note (Signed)
 Improved now but still some confusion in the evenings Discussed Will hold off on meds for safety

## 2023-12-15 NOTE — Patient Instructions (Signed)
 Restart the furosemide  40mg  daily Still hold the farxiga, spironolactone , losartan  and methotrexate 

## 2023-12-15 NOTE — Telephone Encounter (Signed)
 Pts wife called and said pt has appt with Dr Joelle Musca this morning for FU visit and he cannot get into car. I asked how pt was feeling and Mrs Dashnaw asked me to hold on. When Mrs Omar came back to phone she said pt was in the car and on their way to office. Mrs Perkinson will come in for help with w/c. Sending to [place] pool.

## 2023-12-15 NOTE — Assessment & Plan Note (Signed)
 Holding the methotrexate  for now due to bone marrow suppression

## 2023-12-15 NOTE — Telephone Encounter (Signed)
 Carmena with [facility] called with Critical labs  Hemoglobin 7.1 Hematocrit 21.2   Results given to PCP

## 2023-12-15 NOTE — Assessment & Plan Note (Signed)
 Now on pantoprazole  bid Will recheck CBC

## 2023-12-15 NOTE — Assessment & Plan Note (Signed)
Regular now

## 2023-12-15 NOTE — Assessment & Plan Note (Signed)
 Spironolactone , losartan  and farxiga all on hold Will restart the furosemide  regardless

## 2023-12-15 NOTE — Assessment & Plan Note (Signed)
 Will restart the furosemide  40mg  daily due to increased edema and ulcer

## 2023-12-15 NOTE — Progress Notes (Signed)
 Subjective:    Patient ID: Daniel Kline, male    DOB: 1946-01-05, 78 y.o.   MRN: [id number]  HPI Here for hospital follow up With wife  Admitted 4/18 and had prolonged hospitalization Had falls and melenotic stool Found to have upper GI bleed---needed EGD and transfusion. Also got filgrastim  Also with AKI---creatinine went from 2.6 to 3.7 or so Multiple medications were held--including methotrexate , furosemide  off, losartan , farxiga and spironolactone  Noted to have metabolic encephalopathy/delirium---probably multifactorial SNF was recommended but not approved by insurance Finally went home with wife on 5/5  He states he remembered all the hospital stay Had to complain about "gnats" in the food Wife notes both confusion and agitation in the evening She has not felt threatened though  Had trouble getting into the car/dealing with steps at home [facility] with cane Having therapy  Has been checking sugars occasionally They have been okay  Some edema Breathing has been okay Sleeping in chair--no PND Not using CPAP while sleeping in recliner Wife notes no snoring or apnea  No chest pain  Current Outpatient Medications on File Prior to Visit  Medication Sig Dispense Refill   amLODipine  (NORVASC ) 5 MG tablet Take 1 tablet (5 mg total) by mouth daily. 90 tablet 3   calcitRIOL (ROCALTROL) 0.25 MCG capsule Take 0.25 mcg by mouth daily.     carvedilol  (COREG ) 3.125 MG tablet TAKE 1 TABLET BY MOUTH 2 TIMES DAILY WITH A MEAL. 180 tablet 3   Cholecalciferol  (VITAMIN D -3) 25 MCG (1000 UT) CAPS Take 1,000 Units by mouth daily.     clorazepate  (TRANXENE ) 7.5 MG tablet TAKE 1 TABLET BY MOUTH TWICE A DAY AS NEEDED FOR ANXIETY 60 tablet 0   cyanocobalamin (VITAMIN B12) 500 MCG tablet Take 1,000 mcg by mouth daily.     EPINEPHrine  0.3 mg/0.3 mL IJ SOAJ injection Inject 0.3 mg into the muscle as needed for anaphylaxis. 1 each 5   Ferrous Sulfate  (IRON PO) Take 1 tablet by mouth at bedtime.      gabapentin  (NEURONTIN ) 300 MG capsule TAKE 1 CAPSULE BY MOUTH 3 TIMES  DAILY 300 capsule 3   glipiZIDE  (GLUCOTROL  XL) 5 MG 24 hr tablet Take 5 mg by mouth daily with breakfast.     glucose blood (ONETOUCH VERIO) test strip Use to check blood sugar once daily 100 each 12   HYDROcodone -acetaminophen  (NORCO/VICODIN) 5-325 MG tablet TAKE 1 TABLET BY MOUTH EVERY 6 HOURS AS NEEDED FOR PAIN 60 tablet 0   hydrocortisone  2.5 % cream Apply topically 3 (three) times daily as needed. 56 g 3   hydrOXYzine  (ATARAX ) 25 MG tablet TAKE ONE TABLET EVERY EIGHT HOURS AS NEEDED 90 tablet 3   isosorbide  mononitrate (IMDUR ) 60 MG 24 hr tablet Take 1 tablet (60 mg total) by mouth daily before lunch. 90 tablet 3   latanoprost  (XALATAN ) 0.005 % ophthalmic solution Place 1 drop into both eyes at bedtime.     MAGNESIUM  PO Take 1 tablet by mouth daily.     pantoprazole  (PROTONIX ) 40 MG tablet Take 1 tablet (40 mg total) by mouth 2 (two) times daily. 60 tablet 0   rosuvastatin  (CRESTOR ) 10 MG tablet Take 1 tablet (10 mg total) by mouth daily. 90 tablet 3   [Paused] dapagliflozin propanediol (FARXIGA) 5 MG TABS tablet Take 5 mg by mouth daily. (Patient not taking: Reported on 12/15/2023)     [Paused] losartan  (COZAAR ) 100 MG tablet TAKE 1 TABLET BY MOUTH DAILY (Patient not taking: Reported on 12/15/2023) 90  tablet 3   [Paused] spironolactone  (ALDACTONE ) 25 MG tablet Take 25 mg by mouth daily. (Patient not taking: Reported on 12/15/2023)     No current facility-administered medications on file prior to visit.    Allergies  Allergen Reactions   Bee Venom Anaphylaxis   Oxycodone  Other (See Comments)    Delusions   Hydromorphone  Other (See Comments)    hallucinating   Hydroxychloroquine     Other reaction(s): Other (See Comments) He broke out really badly.   Zolpidem Other (See Comments)    Past Medical History:  Diagnosis Date   Anemia    H/O   Anxiety    Arthritis    Atrial flutter (HCC)    a. s/p post ablation in  04/2017   Chronic kidney disease    Complication of anesthesia    CTCL (cutaneous T-cell lymphoma) (HCC)    Diabetes mellitus without complication (HCC)    Diastolic dysfunction    a. 03/2022 Echo: EF 60-65%, no rwma, GrI DD, nl RV fxn.   Dysplastic nevus 12/19/2017   Right distal lat. forearm near wrist. Severe atypia, close to peripheral margin.   Dysplastic nevus 06/21/2018   Upper back right paraspinal. Severe atypia, peripheral margin involved. Excised 07/11/2018, margins free.   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 forearm. SCCis, hypertrophic.    Past Surgical History:  Procedure Laterality Date   BACK SURGERY     LUMBAR   CARDIAC ELECTROPHYSIOLOGY STUDY AND ABLATION  2019   CATARACT EXTRACTION W/PHACO Right 05/05/2022   Procedure: CATARACT EXTRACTION PHACO AND INTRAOCULAR LENS PLACEMENT (IOC) RIGHT;  Surgeon: Annell Kidney, MD;  Location: [facility];  Service: Ophthalmology;  Laterality: Right;  Diabetic 10.42 01:13.4   COLONOSCOPY WITH PROPOFOL  N/A 04/04/2019   Procedure: COLONOSCOPY WITH PROPOFOL ;  Surgeon: Toledo, Alphonsus Jeans, MD;  Location: [facility];  Service: Gastroenterology;  Laterality: N/A;   ESOPHAGOGASTRODUODENOSCOPY N/A 11/28/2023   Procedure: EGD (ESOPHAGOGASTRODUODENOSCOPY);  Surgeon: Marnee Sink, MD;  Location: [facility];  Service: Endoscopy;  Laterality: N/A;   I & D EXTREMITY Right 02/01/2020   Procedure: IRRIGATION AND DEBRIDEMENT EXTREMITY with poly exchange;   Surgeon: Arlyne Lame, MD;  Location: [facility];  Service: Orthopedics;  Laterality: Right;   INCISION AND DRAINAGE     BACK-MRSA INFECTION AFTER BACK SURGERY   KNEE ARTHROPLASTY Right 01/28/2020   Procedure: COMPUTER ASSISTED TOTAL KNEE ARTHROPLASTY;  Surgeon: Arlyne Lame, MD;  Location: [facility];  Service: Orthopedics;  Laterality: Right;   MOUTH SURGERY     right elbow surgery     right knee surgery     right shoulder surgery     from polio damage   TONSILLECTOMY     TOTAL HIP ARTHROPLASTY Bilateral 04/2016    Family History  Adopted: Yes    Social History   Socioeconomic History   Marital status: Married    Spouse name: Not on file  Number of children: 0   Years of education: Not on file   Highest education level: Not on file  Occupational History   Occupation: Corporate investment banker    Comment: 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   Occupation: Engineering geologist    Comment: Retired   Occupation: Scientist, research (medical)    Comment: Retired  Tobacco Use   Smoking status: Former    Types: Cigars    Quit date: 09/23/1990    Years since quitting: 33.2    Passive exposure: Past (as a child)   Smokeless tobacco: Former    Quit date: 02/25/2006  Vaping Use   Vaping status: Never Used  Substance and Sexual Activity   Alcohol use: No    Alcohol/week: 0.0 standard drinks of alcohol   Drug use: No   Sexual activity: Yes    Partners: Female  Other Topics Concern   Not on file  Social History Narrative   Has living will   Wife is health care POA---then brother or sister   Would accept resuscitation attempts but no prolonged ventilation or tube feeds   Social Drivers of Health   Financial Resource Strain: Low Risk  (07/20/2017)   Overall Financial Resource Strain (CARDIA)    Difficulty of Paying Living Expenses: Not hard at all  Food Insecurity: No Food Insecurity (11/26/2023)   Hunger Vital Sign    Worried About Running Out of Food in the Last Year: Never  true    Ran Out of Food in the Last Year: Never true  Transportation Needs: No Transportation Needs (11/26/2023)   PRAPARE - Administrator, Civil Service (Medical): No    Lack of Transportation (Non-Medical): No  Physical Activity: Not on file  Stress: Not on file  Social Connections: Moderately Isolated (11/26/2023)   Social Connection and Isolation Panel [NHANES]    Frequency of Communication with Friends and Family: More than three times a week    Frequency of Social Gatherings with Friends and Family: Once a week    Attends Religious Services: Never    Database administrator or Organizations: No    Attends Banker Meetings: Never    Marital Status: Married  Catering manager Violence: Not At Risk (11/26/2023)   Humiliation, Afraid, Rape, and Kick questionnaire    Fear of Current or Ex-Partner: No    Emotionally Abused: No    Physically Abused: No    Sexually Abused: No   Review of Systems Eating okay Has been weighing--but not since getting home    Objective:   Physical Exam Constitutional:      Appearance: Normal appearance.  Cardiovascular:     Rate and Rhythm: Normal rate and regular rhythm.     Heart sounds:     No gallop.     Comments: Gr 3/6 systolic murmur Pulmonary:     Effort: Pulmonary effort is normal.     Breath sounds: Normal breath sounds. No wheezing or rales.  Abdominal:     Palpations: Abdomen is soft.     Tenderness: There is no abdominal tenderness.  Musculoskeletal:     Cervical back: Neck supple.     Comments: 2+ pitting edema on right, 1+ on left  Lymphadenopathy:     Cervical: No cervical adenopathy.  Skin:    Comments: Small non infected anterior low left calf ulcer Redness on right looks more fluid related--not infection  Neurological:     Mental Status: He is alert.  Psychiatric:  Comments: Got fired up some at wife--discussed No depression            Assessment & Plan:

## 2023-12-15 NOTE — Telephone Encounter (Signed)
 Not strikingly different---and basically asymptomatic No action for now--would recheck in 2 weeks

## 2023-12-15 NOTE — Telephone Encounter (Signed)
 Thank you for helping him in and to the room.

## 2023-12-15 NOTE — Assessment & Plan Note (Signed)
 Non infected Redressed with silvadene/non stick dressing Discussed wife wife--can just use neosporin  every 1-2 days

## 2023-12-16 ENCOUNTER — Telehealth: Payer: Self-pay

## 2023-12-16 DIAGNOSIS — G8929 Other chronic pain: Secondary | ICD-10-CM | POA: Diagnosis not present

## 2023-12-16 DIAGNOSIS — I4892 Unspecified atrial flutter: Secondary | ICD-10-CM | POA: Diagnosis not present

## 2023-12-16 DIAGNOSIS — Z96651 Presence of right artificial knee joint: Secondary | ICD-10-CM | POA: Diagnosis not present

## 2023-12-16 DIAGNOSIS — Z96643 Presence of artificial hip joint, bilateral: Secondary | ICD-10-CM | POA: Diagnosis not present

## 2023-12-16 DIAGNOSIS — Z7984 Long term (current) use of oral hypoglycemic drugs: Secondary | ICD-10-CM | POA: Diagnosis not present

## 2023-12-16 DIAGNOSIS — I491 Atrial premature depolarization: Secondary | ICD-10-CM | POA: Diagnosis not present

## 2023-12-16 DIAGNOSIS — Z8572 Personal history of non-Hodgkin lymphomas: Secondary | ICD-10-CM | POA: Diagnosis not present

## 2023-12-16 DIAGNOSIS — I130 Hypertensive heart and chronic kidney disease with heart failure and stage 1 through stage 4 chronic kidney disease, or unspecified chronic kidney disease: Secondary | ICD-10-CM | POA: Diagnosis not present

## 2023-12-16 DIAGNOSIS — E1122 Type 2 diabetes mellitus with diabetic chronic kidney disease: Secondary | ICD-10-CM | POA: Diagnosis not present

## 2023-12-16 DIAGNOSIS — Z85828 Personal history of other malignant neoplasm of skin: Secondary | ICD-10-CM | POA: Diagnosis not present

## 2023-12-16 DIAGNOSIS — D631 Anemia in chronic kidney disease: Secondary | ICD-10-CM | POA: Diagnosis not present

## 2023-12-16 DIAGNOSIS — N179 Acute kidney failure, unspecified: Secondary | ICD-10-CM | POA: Diagnosis not present

## 2023-12-16 DIAGNOSIS — K8020 Calculus of gallbladder without cholecystitis without obstruction: Secondary | ICD-10-CM | POA: Diagnosis not present

## 2023-12-16 DIAGNOSIS — G4733 Obstructive sleep apnea (adult) (pediatric): Secondary | ICD-10-CM | POA: Diagnosis not present

## 2023-12-16 DIAGNOSIS — E785 Hyperlipidemia, unspecified: Secondary | ICD-10-CM | POA: Diagnosis not present

## 2023-12-16 DIAGNOSIS — H409 Unspecified glaucoma: Secondary | ICD-10-CM | POA: Diagnosis not present

## 2023-12-16 DIAGNOSIS — I480 Paroxysmal atrial fibrillation: Secondary | ICD-10-CM | POA: Diagnosis not present

## 2023-12-16 DIAGNOSIS — I5032 Chronic diastolic (congestive) heart failure: Secondary | ICD-10-CM | POA: Diagnosis not present

## 2023-12-16 DIAGNOSIS — K922 Gastrointestinal hemorrhage, unspecified: Secondary | ICD-10-CM | POA: Diagnosis not present

## 2023-12-16 DIAGNOSIS — Z556 Problems related to health literacy: Secondary | ICD-10-CM | POA: Diagnosis not present

## 2023-12-16 DIAGNOSIS — N183 Chronic kidney disease, stage 3 unspecified: Secondary | ICD-10-CM | POA: Diagnosis not present

## 2023-12-16 NOTE — Transitions of Care (Post Inpatient/ED Visit) (Signed)
   12/16/2023  Name: DARTANIAN FAHRENKRUG MRN: [id number] DOB: 1946-07-14  Today's TOC FU Call Status: Today's TOC FU Call Status:: Successful TOC FU Call Completed TOC FU Call Complete Date: 12/16/23 Patient's Name and Date of Birth confirmed.  Transition Care Management Follow-up Telephone Call Date of Discharge: 12/12/23 Discharge Facility: [facility]) Type of Discharge: Inpatient Admission Primary Inpatient Discharge Diagnosis:: Diabetes, ESRD, CHF How have you been since you were released from the hospital?: Same (Dr Joelle Musca said patient still has delirium per wife) Any questions or concerns?: No  Items Reviewed: Did you receive and understand the discharge instructions provided?: Yes Medications obtained,verified, and reconciled?: Partial Review Completed (Wife [name] states, "Dr. Joelle Musca went over everything with me yesterday and the nurse or PT put all the medicines he's not suppose to take until June in a bag")  Medications Reviewed Today:  Wife declined states MD went over yesterday 12/15/23 and she has a good understanding Medications Reviewed Today   Medications were not reviewed in this encounter         [place], RN, BSN, CCM [place]  [facility], [facility] RN Care Manager Direct Dial: [phone]

## 2023-12-16 NOTE — Telephone Encounter (Signed)
Verbal orders given to Esther. 

## 2023-12-16 NOTE — Telephone Encounter (Signed)
 Copied from CRM [id number]. Topic: Clinical - Home Health Verbal Orders >> Dec 16, 2023 11:45 AM Everlean Hoar wrote: Caller/Agency: Esther with [facility] Callback Number: [phone] Service Requested: Occupational Therapy Frequency: 1w2 Any new concerns about the patient? No

## 2023-12-19 ENCOUNTER — Inpatient Hospital Stay: Admitting: Internal Medicine

## 2023-12-19 ENCOUNTER — Other Ambulatory Visit: Payer: Self-pay | Admitting: Internal Medicine

## 2023-12-19 ENCOUNTER — Telehealth: Payer: Self-pay

## 2023-12-19 DIAGNOSIS — I10 Essential (primary) hypertension: Secondary | ICD-10-CM

## 2023-12-19 NOTE — Telephone Encounter (Signed)
 Last filled 09-02-23 #60 Last OV 12-15-23 Next OV 12-29-23 [facility]

## 2023-12-19 NOTE — Progress Notes (Signed)
 Complex Care Management Note  Care Guide Note 12/19/2023 Name: Daniel Kline MRN: [id number] DOB: June 29, 1946  Daniel Kline is a 78 y.o. year old male who sees Helaine Llanos, MD for primary care. I reached out to Richad Champagne by phone today to offer complex care management services.  Mr. Kamerman was given information about Complex Care Management services today including:   The Complex Care Management services include support from the care team which includes your Nurse Care Manager, Clinical Social Worker, or Pharmacist.  The Complex Care Management team is here to help remove barriers to the health concerns and goals most important to you. Complex Care Management services are voluntary, and the patient may decline or stop services at any time by request to their care team member.   Complex Care Management Consent Status: Patient did not agree to participate in complex care management services at this time.  Follow up plan:   Encounter Outcome:  Patient Refused  Lenton Rail , RMA     [facility]  [facility], [facility] Guide  Direct Dial: [phone]  Website: [email or web address]

## 2023-12-19 NOTE — Progress Notes (Signed)
 Complex Care Management Note Care Guide Note  12/19/2023 Name: Daniel Kline MRN: [id number] DOB: 18-Sep-1945   Complex Care Management Outreach Attempts: An unsuccessful telephone outreach was attempted today to offer the patient information about available complex care management services.  Follow Up Plan:  Additional outreach attempts will be made to offer the patient complex care management information and services.   Encounter Outcome:  No Answer  Sig Lenton Rail , RMA     [facility]  [facility], [facility] Guide  Direct Dial: [phone]  Website: [email or web address]

## 2023-12-20 ENCOUNTER — Telehealth: Payer: Self-pay | Admitting: Oncology

## 2023-12-20 DIAGNOSIS — I4892 Unspecified atrial flutter: Secondary | ICD-10-CM | POA: Diagnosis not present

## 2023-12-20 DIAGNOSIS — D631 Anemia in chronic kidney disease: Secondary | ICD-10-CM | POA: Diagnosis not present

## 2023-12-20 DIAGNOSIS — E1122 Type 2 diabetes mellitus with diabetic chronic kidney disease: Secondary | ICD-10-CM | POA: Diagnosis not present

## 2023-12-20 DIAGNOSIS — G8929 Other chronic pain: Secondary | ICD-10-CM | POA: Diagnosis not present

## 2023-12-20 DIAGNOSIS — Z96643 Presence of artificial hip joint, bilateral: Secondary | ICD-10-CM | POA: Diagnosis not present

## 2023-12-20 DIAGNOSIS — H409 Unspecified glaucoma: Secondary | ICD-10-CM | POA: Diagnosis not present

## 2023-12-20 DIAGNOSIS — G4733 Obstructive sleep apnea (adult) (pediatric): Secondary | ICD-10-CM | POA: Diagnosis not present

## 2023-12-20 DIAGNOSIS — E785 Hyperlipidemia, unspecified: Secondary | ICD-10-CM | POA: Diagnosis not present

## 2023-12-20 DIAGNOSIS — Z85828 Personal history of other malignant neoplasm of skin: Secondary | ICD-10-CM | POA: Diagnosis not present

## 2023-12-20 DIAGNOSIS — Z556 Problems related to health literacy: Secondary | ICD-10-CM | POA: Diagnosis not present

## 2023-12-20 DIAGNOSIS — N183 Chronic kidney disease, stage 3 unspecified: Secondary | ICD-10-CM | POA: Diagnosis not present

## 2023-12-20 DIAGNOSIS — N179 Acute kidney failure, unspecified: Secondary | ICD-10-CM | POA: Diagnosis not present

## 2023-12-20 DIAGNOSIS — K8020 Calculus of gallbladder without cholecystitis without obstruction: Secondary | ICD-10-CM | POA: Diagnosis not present

## 2023-12-20 DIAGNOSIS — I491 Atrial premature depolarization: Secondary | ICD-10-CM | POA: Diagnosis not present

## 2023-12-20 DIAGNOSIS — Z96651 Presence of right artificial knee joint: Secondary | ICD-10-CM | POA: Diagnosis not present

## 2023-12-20 DIAGNOSIS — Z7984 Long term (current) use of oral hypoglycemic drugs: Secondary | ICD-10-CM | POA: Diagnosis not present

## 2023-12-20 DIAGNOSIS — Z8572 Personal history of non-Hodgkin lymphomas: Secondary | ICD-10-CM | POA: Diagnosis not present

## 2023-12-20 DIAGNOSIS — I130 Hypertensive heart and chronic kidney disease with heart failure and stage 1 through stage 4 chronic kidney disease, or unspecified chronic kidney disease: Secondary | ICD-10-CM | POA: Diagnosis not present

## 2023-12-20 DIAGNOSIS — I5032 Chronic diastolic (congestive) heart failure: Secondary | ICD-10-CM | POA: Diagnosis not present

## 2023-12-20 DIAGNOSIS — K922 Gastrointestinal hemorrhage, unspecified: Secondary | ICD-10-CM | POA: Diagnosis not present

## 2023-12-20 DIAGNOSIS — I480 Paroxysmal atrial fibrillation: Secondary | ICD-10-CM | POA: Diagnosis not present

## 2023-12-20 NOTE — Telephone Encounter (Signed)
 Spoke with spouse in regards to scheduling hospital follow-up with Dr. Randy Buttery. She would like to move forward but stated that she needs to call back this afternoon. My direct extension was provided.

## 2023-12-21 ENCOUNTER — Ambulatory Visit: Payer: Self-pay

## 2023-12-22 DIAGNOSIS — E1122 Type 2 diabetes mellitus with diabetic chronic kidney disease: Secondary | ICD-10-CM | POA: Diagnosis not present

## 2023-12-22 DIAGNOSIS — N2581 Secondary hyperparathyroidism of renal origin: Secondary | ICD-10-CM | POA: Diagnosis not present

## 2023-12-22 DIAGNOSIS — I129 Hypertensive chronic kidney disease with stage 1 through stage 4 chronic kidney disease, or unspecified chronic kidney disease: Secondary | ICD-10-CM | POA: Diagnosis not present

## 2023-12-22 DIAGNOSIS — E785 Hyperlipidemia, unspecified: Secondary | ICD-10-CM | POA: Diagnosis not present

## 2023-12-22 DIAGNOSIS — N184 Chronic kidney disease, stage 4 (severe): Secondary | ICD-10-CM | POA: Diagnosis not present

## 2023-12-22 DIAGNOSIS — D631 Anemia in chronic kidney disease: Secondary | ICD-10-CM | POA: Diagnosis not present

## 2023-12-22 DIAGNOSIS — R809 Proteinuria, unspecified: Secondary | ICD-10-CM | POA: Diagnosis not present

## 2023-12-23 ENCOUNTER — Telehealth: Payer: Self-pay | Admitting: Internal Medicine

## 2023-12-23 ENCOUNTER — Ambulatory Visit: Payer: Self-pay | Admitting: Internal Medicine

## 2023-12-23 DIAGNOSIS — I480 Paroxysmal atrial fibrillation: Secondary | ICD-10-CM | POA: Diagnosis not present

## 2023-12-23 DIAGNOSIS — I491 Atrial premature depolarization: Secondary | ICD-10-CM | POA: Diagnosis not present

## 2023-12-23 DIAGNOSIS — N179 Acute kidney failure, unspecified: Secondary | ICD-10-CM | POA: Diagnosis not present

## 2023-12-23 DIAGNOSIS — Z7984 Long term (current) use of oral hypoglycemic drugs: Secondary | ICD-10-CM | POA: Diagnosis not present

## 2023-12-23 DIAGNOSIS — K922 Gastrointestinal hemorrhage, unspecified: Secondary | ICD-10-CM | POA: Diagnosis not present

## 2023-12-23 DIAGNOSIS — D631 Anemia in chronic kidney disease: Secondary | ICD-10-CM | POA: Diagnosis not present

## 2023-12-23 DIAGNOSIS — I130 Hypertensive heart and chronic kidney disease with heart failure and stage 1 through stage 4 chronic kidney disease, or unspecified chronic kidney disease: Secondary | ICD-10-CM | POA: Diagnosis not present

## 2023-12-23 DIAGNOSIS — Z85828 Personal history of other malignant neoplasm of skin: Secondary | ICD-10-CM | POA: Diagnosis not present

## 2023-12-23 DIAGNOSIS — H409 Unspecified glaucoma: Secondary | ICD-10-CM | POA: Diagnosis not present

## 2023-12-23 DIAGNOSIS — Z8572 Personal history of non-Hodgkin lymphomas: Secondary | ICD-10-CM | POA: Diagnosis not present

## 2023-12-23 DIAGNOSIS — K8020 Calculus of gallbladder without cholecystitis without obstruction: Secondary | ICD-10-CM | POA: Diagnosis not present

## 2023-12-23 DIAGNOSIS — G8929 Other chronic pain: Secondary | ICD-10-CM | POA: Diagnosis not present

## 2023-12-23 DIAGNOSIS — E785 Hyperlipidemia, unspecified: Secondary | ICD-10-CM | POA: Diagnosis not present

## 2023-12-23 DIAGNOSIS — Z96643 Presence of artificial hip joint, bilateral: Secondary | ICD-10-CM | POA: Diagnosis not present

## 2023-12-23 DIAGNOSIS — I4892 Unspecified atrial flutter: Secondary | ICD-10-CM | POA: Diagnosis not present

## 2023-12-23 DIAGNOSIS — Z96651 Presence of right artificial knee joint: Secondary | ICD-10-CM | POA: Diagnosis not present

## 2023-12-23 DIAGNOSIS — E1122 Type 2 diabetes mellitus with diabetic chronic kidney disease: Secondary | ICD-10-CM | POA: Diagnosis not present

## 2023-12-23 DIAGNOSIS — N183 Chronic kidney disease, stage 3 unspecified: Secondary | ICD-10-CM | POA: Diagnosis not present

## 2023-12-23 DIAGNOSIS — Z556 Problems related to health literacy: Secondary | ICD-10-CM | POA: Diagnosis not present

## 2023-12-23 DIAGNOSIS — G4733 Obstructive sleep apnea (adult) (pediatric): Secondary | ICD-10-CM | POA: Diagnosis not present

## 2023-12-23 DIAGNOSIS — I5032 Chronic diastolic (congestive) heart failure: Secondary | ICD-10-CM | POA: Diagnosis not present

## 2023-12-23 NOTE — Telephone Encounter (Signed)
 Spoke to pt's wife. I could not see a call from anyone. Asked if she was doing ok and she said she was considering everything. Daniel Kline he has not been as bad as he was in the office the other day. She said she does not understand the changes that are going on because he does not usually act like that. I told her that is something we can discuss more at his ov 12-29-23.

## 2023-12-23 NOTE — Telephone Encounter (Signed)
 Chief Complaint: Elevated BP (188/106)  Symptoms: Gained 3 lbs over last 3 days, legs have non-pitting "firm" edema  Pertinent Negatives: Patient denies blurred vision, chest pain, headache, SOB  Disposition:  [x] Appointment(In office)  Additional Notes: Spoke with pt's home PT, Angie. Pt states he had an argument before Angie arrived and not sure if that is why BP is elevated. Pt took BP medication 90 minutes before she arrived. Pt has an appointment scheduled with Dr. Joelle Musca on 5/22. Pt advised to monitor BP and weight gain over the weekend. This RN educated pt on new-worsening symptoms and when to call back/seek emergent care. Pt verbalized understanding and agrees to plan.     Copied from CRM [id number]. Topic: Clinical - Red Word Triage >> Dec 23, 2023  4:55 PM Fredrica W wrote: Red Word that prompted transfer to Nurse Triage: BP 190/110  gained 3 lbs over last 3 days has swelling on legs Reason for Disposition  Systolic BP  >= 160 OR Diastolic >= 100  Answer Assessment - Initial Assessment Questions BLOOD PRESSURE: "What is the blood pressure?" "Did you take at least two measurements 5 minutes apart?"     182/104 when first arrived, 190/110 10 minutes ago, 188/106 now, HR 60 HOW: "How did you take your blood pressure?" (e.g., automatic home BP monitor, visiting nurse)     Home health physical therapist HISTORY: "Do you have a history of high blood pressure?"     Yes MEDICINES: "Are you taking any medicines for blood pressure?" "Have you missed any doses recently?"     Yes, took last dose 90 minutes ago OTHER SYMPTOMS: "Do you have any symptoms?" (e.g., blurred vision, chest pain, difficulty breathing, headache, weakness)     Denies  Protocols used: Blood Pressure - High-A-AH

## 2023-12-23 NOTE — Telephone Encounter (Signed)
 Copied from CRM [id number]. Topic: General - Call Back - No Documentation >> Dec 22, 2023  1:45 PM Daniel Kline wrote: Reason for CRM: Wife Daniel Kline stated that they received a call from the office and no voicemail was left. Called in to see what call was in regards to. Would like call back at [phone].

## 2023-12-26 NOTE — Telephone Encounter (Signed)
 He does fire up emotionally so I would not take any action unless persistently elevated BP

## 2023-12-27 ENCOUNTER — Emergency Department

## 2023-12-27 ENCOUNTER — Ambulatory Visit: Payer: Self-pay

## 2023-12-27 ENCOUNTER — Other Ambulatory Visit: Payer: Self-pay

## 2023-12-27 ENCOUNTER — Encounter: Payer: Self-pay | Admitting: Emergency Medicine

## 2023-12-27 ENCOUNTER — Inpatient Hospital Stay
Admission: EM | Admit: 2023-12-27 | Discharge: 2023-12-31 | DRG: 602 | Disposition: A | Discharge status: Home Health | Attending: Internal Medicine | Admitting: Internal Medicine

## 2023-12-27 DIAGNOSIS — I6782 Cerebral ischemia: Secondary | ICD-10-CM | POA: Diagnosis not present

## 2023-12-27 DIAGNOSIS — Z96643 Presence of artificial hip joint, bilateral: Secondary | ICD-10-CM | POA: Diagnosis present

## 2023-12-27 DIAGNOSIS — Z96651 Presence of right artificial knee joint: Secondary | ICD-10-CM | POA: Diagnosis not present

## 2023-12-27 DIAGNOSIS — G9340 Encephalopathy, unspecified: Secondary | ICD-10-CM

## 2023-12-27 DIAGNOSIS — D631 Anemia in chronic kidney disease: Secondary | ICD-10-CM | POA: Diagnosis not present

## 2023-12-27 DIAGNOSIS — Z7984 Long term (current) use of oral hypoglycemic drugs: Secondary | ICD-10-CM | POA: Diagnosis not present

## 2023-12-27 DIAGNOSIS — B999 Unspecified infectious disease: Secondary | ICD-10-CM | POA: Diagnosis not present

## 2023-12-27 DIAGNOSIS — Z6838 Body mass index (BMI) 38.0-38.9, adult: Secondary | ICD-10-CM

## 2023-12-27 DIAGNOSIS — E11649 Type 2 diabetes mellitus with hypoglycemia without coma: Secondary | ICD-10-CM | POA: Diagnosis not present

## 2023-12-27 DIAGNOSIS — J449 Chronic obstructive pulmonary disease, unspecified: Secondary | ICD-10-CM | POA: Diagnosis present

## 2023-12-27 DIAGNOSIS — E785 Hyperlipidemia, unspecified: Secondary | ICD-10-CM | POA: Diagnosis not present

## 2023-12-27 DIAGNOSIS — Z634 Disappearance and death of family member: Secondary | ICD-10-CM

## 2023-12-27 DIAGNOSIS — E66812 Obesity, class 2: Secondary | ICD-10-CM | POA: Diagnosis not present

## 2023-12-27 DIAGNOSIS — G9341 Metabolic encephalopathy: Secondary | ICD-10-CM | POA: Diagnosis not present

## 2023-12-27 DIAGNOSIS — G8929 Other chronic pain: Secondary | ICD-10-CM | POA: Diagnosis present

## 2023-12-27 DIAGNOSIS — F419 Anxiety disorder, unspecified: Secondary | ICD-10-CM | POA: Diagnosis present

## 2023-12-27 DIAGNOSIS — L03115 Cellulitis of right lower limb: Secondary | ICD-10-CM | POA: Diagnosis not present

## 2023-12-27 DIAGNOSIS — I130 Hypertensive heart and chronic kidney disease with heart failure and stage 1 through stage 4 chronic kidney disease, or unspecified chronic kidney disease: Secondary | ICD-10-CM | POA: Diagnosis not present

## 2023-12-27 DIAGNOSIS — Z87891 Personal history of nicotine dependence: Secondary | ICD-10-CM | POA: Diagnosis not present

## 2023-12-27 DIAGNOSIS — F05 Delirium due to known physiological condition: Secondary | ICD-10-CM | POA: Diagnosis not present

## 2023-12-27 DIAGNOSIS — D638 Anemia in other chronic diseases classified elsewhere: Secondary | ICD-10-CM | POA: Diagnosis present

## 2023-12-27 DIAGNOSIS — R0902 Hypoxemia: Secondary | ICD-10-CM | POA: Diagnosis present

## 2023-12-27 DIAGNOSIS — N184 Chronic kidney disease, stage 4 (severe): Secondary | ICD-10-CM | POA: Diagnosis not present

## 2023-12-27 DIAGNOSIS — I10 Essential (primary) hypertension: Secondary | ICD-10-CM | POA: Diagnosis present

## 2023-12-27 DIAGNOSIS — G4734 Idiopathic sleep related nonobstructive alveolar hypoventilation: Secondary | ICD-10-CM | POA: Diagnosis not present

## 2023-12-27 DIAGNOSIS — Z72820 Sleep deprivation: Secondary | ICD-10-CM

## 2023-12-27 DIAGNOSIS — R443 Hallucinations, unspecified: Secondary | ICD-10-CM | POA: Diagnosis present

## 2023-12-27 DIAGNOSIS — I5032 Chronic diastolic (congestive) heart failure: Secondary | ICD-10-CM | POA: Insufficient documentation

## 2023-12-27 DIAGNOSIS — G4733 Obstructive sleep apnea (adult) (pediatric): Secondary | ICD-10-CM | POA: Diagnosis present

## 2023-12-27 DIAGNOSIS — M069 Rheumatoid arthritis, unspecified: Secondary | ICD-10-CM | POA: Diagnosis not present

## 2023-12-27 DIAGNOSIS — I517 Cardiomegaly: Secondary | ICD-10-CM | POA: Diagnosis not present

## 2023-12-27 DIAGNOSIS — N189 Chronic kidney disease, unspecified: Secondary | ICD-10-CM | POA: Diagnosis not present

## 2023-12-27 DIAGNOSIS — Z91030 Bee allergy status: Secondary | ICD-10-CM

## 2023-12-27 DIAGNOSIS — Z79899 Other long term (current) drug therapy: Secondary | ICD-10-CM

## 2023-12-27 DIAGNOSIS — F0392 Unspecified dementia, unspecified severity, with psychotic disturbance: Secondary | ICD-10-CM | POA: Diagnosis present

## 2023-12-27 DIAGNOSIS — K257 Chronic gastric ulcer without hemorrhage or perforation: Secondary | ICD-10-CM

## 2023-12-27 DIAGNOSIS — I480 Paroxysmal atrial fibrillation: Secondary | ICD-10-CM | POA: Diagnosis not present

## 2023-12-27 DIAGNOSIS — N179 Acute kidney failure, unspecified: Secondary | ICD-10-CM | POA: Diagnosis present

## 2023-12-27 DIAGNOSIS — Z85828 Personal history of other malignant neoplasm of skin: Secondary | ICD-10-CM

## 2023-12-27 DIAGNOSIS — Z8612 Personal history of poliomyelitis: Secondary | ICD-10-CM

## 2023-12-27 DIAGNOSIS — E1122 Type 2 diabetes mellitus with diabetic chronic kidney disease: Secondary | ICD-10-CM | POA: Diagnosis present

## 2023-12-27 DIAGNOSIS — Z885 Allergy status to narcotic agent status: Secondary | ICD-10-CM

## 2023-12-27 DIAGNOSIS — R0989 Other specified symptoms and signs involving the circulatory and respiratory systems: Secondary | ICD-10-CM | POA: Diagnosis not present

## 2023-12-27 DIAGNOSIS — R4182 Altered mental status, unspecified: Principal | ICD-10-CM

## 2023-12-27 DIAGNOSIS — Z888 Allergy status to other drugs, medicaments and biological substances status: Secondary | ICD-10-CM

## 2023-12-27 DIAGNOSIS — Z8572 Personal history of non-Hodgkin lymphomas: Secondary | ICD-10-CM

## 2023-12-27 LAB — COMPREHENSIVE METABOLIC PANEL WITH GFR
ALT: 10 U/L (ref 0–44)
AST: 10 U/L — ABNORMAL LOW (ref 15–41)
Albumin: 3 g/dL — ABNORMAL LOW (ref 3.5–5.0)
Alkaline Phosphatase: 59 U/L (ref 38–126)
Anion gap: 9 (ref 5–15)
BUN: 35 mg/dL — ABNORMAL HIGH (ref 8–23)
CO2: 26 mmol/L (ref 22–32)
Calcium: 8.4 mg/dL — ABNORMAL LOW (ref 8.9–10.3)
Chloride: 103 mmol/L (ref 98–111)
Creatinine, Ser: 2.89 mg/dL — ABNORMAL HIGH (ref 0.61–1.24)
GFR, Estimated: 21 mL/min — ABNORMAL LOW (ref >60)
Glucose, Bld: 123 mg/dL — ABNORMAL HIGH (ref 70–99)
Potassium: 3.7 mmol/L (ref 3.5–5.1)
Sodium: 138 mmol/L (ref 135–145)
Total Bilirubin: 0.5 mg/dL (ref 0.0–1.2)
Total Protein: 6.6 g/dL (ref 6.5–8.1)

## 2023-12-27 LAB — URINE DRUG SCREEN, QUALITATIVE (ARMC ONLY)
Amphetamines, Ur Screen: NOT DETECTED
Barbiturates, Ur Screen: NOT DETECTED
Benzodiazepine, Ur Scrn: POSITIVE — AB
Cannabinoid 50 Ng, Ur ~~LOC~~: NOT DETECTED
Cocaine Metabolite,Ur ~~LOC~~: NOT DETECTED
MDMA (Ecstasy)Ur Screen: NOT DETECTED
Methadone Scn, Ur: NOT DETECTED
Opiate, Ur Screen: NOT DETECTED
Phencyclidine (PCP) Ur S: NOT DETECTED
Tricyclic, Ur Screen: NOT DETECTED

## 2023-12-27 LAB — URINALYSIS, ROUTINE W REFLEX MICROSCOPIC
Bacteria, UA: NONE SEEN
Bilirubin Urine: NEGATIVE
Glucose, UA: 50 mg/dL — AB
Hgb urine dipstick: NEGATIVE
Ketones, ur: NEGATIVE mg/dL
Leukocytes,Ua: NEGATIVE
Nitrite: NEGATIVE
Protein, ur: 30 mg/dL — AB
Specific Gravity, Urine: 1.009 (ref 1.005–1.030)
pH: 6 (ref 5.0–8.0)

## 2023-12-27 LAB — LIPASE, BLOOD: Lipase: 47 U/L (ref 11–51)

## 2023-12-27 LAB — SALICYLATE LEVEL: Salicylate Lvl: <7 mg/dL — ABNORMAL LOW (ref 7.0–30.0)

## 2023-12-27 LAB — CBC
HCT: 26 % — ABNORMAL LOW (ref 39.0–52.0)
Hemoglobin: 8.3 g/dL — ABNORMAL LOW (ref 13.0–17.0)
MCH: 32 pg (ref 26.0–34.0)
MCHC: 31.9 g/dL (ref 30.0–36.0)
MCV: 100.4 fL — ABNORMAL HIGH (ref 80.0–100.0)
Platelets: 168 10*3/uL (ref 150–400)
RBC: 2.59 MIL/uL — ABNORMAL LOW (ref 4.22–5.81)
RDW: 17.5 % — ABNORMAL HIGH (ref 11.5–15.5)
WBC: 7.7 10*3/uL (ref 4.0–10.5)
nRBC: 0 % (ref 0.0–0.2)

## 2023-12-27 LAB — HEMOGLOBIN A1C
Hgb A1c MFr Bld: 5.7 % — ABNORMAL HIGH (ref 4.8–5.6)
Mean Plasma Glucose: 116.89 mg/dL

## 2023-12-27 LAB — AMMONIA: Ammonia: 23 umol/L (ref 9–35)

## 2023-12-27 LAB — ETHANOL: Alcohol, Ethyl (B): <15 mg/dL (ref <15)

## 2023-12-27 LAB — ACETAMINOPHEN LEVEL: Acetaminophen (Tylenol), Serum: <10 ug/mL — ABNORMAL LOW (ref 10–30)

## 2023-12-27 LAB — CBG MONITORING, ED: Glucose-Capillary: 123 mg/dL — ABNORMAL HIGH (ref 70–99)

## 2023-12-27 LAB — GLUCOSE, CAPILLARY
Glucose-Capillary: 119 mg/dL — ABNORMAL HIGH (ref 70–99)
Glucose-Capillary: 88 mg/dL (ref 70–99)

## 2023-12-27 MED ORDER — CARVEDILOL 3.125 MG PO TABS
3.1250 mg | ORAL_TABLET | Freq: Two times a day (BID) | ORAL | Status: DC
  Administered 2023-12-28 – 2023-12-31 (×7): 3.125 mg via ORAL
  Filled 2023-12-27 (×7): qty 1

## 2023-12-27 MED ORDER — ACETAMINOPHEN 650 MG RE SUPP: 650.0000 mg | Freq: Four times a day (QID) | RECTAL | Status: DC | PRN

## 2023-12-27 MED ORDER — HEPARIN SODIUM (PORCINE) 5000 UNIT/ML IJ SOLN
5000.0000 [IU] | Freq: Three times a day (TID) | INTRAMUSCULAR | Status: DC
  Administered 2023-12-27 – 2023-12-31 (×11): 5000 [IU] via SUBCUTANEOUS
  Filled 2023-12-27 (×11): qty 1

## 2023-12-27 MED ORDER — AMLODIPINE BESYLATE 5 MG PO TABS
5.0000 mg | ORAL_TABLET | Freq: Every day | ORAL | Status: DC
  Administered 2023-12-28 – 2023-12-29 (×2): 5 mg via ORAL
  Filled 2023-12-27 (×3): qty 1

## 2023-12-27 MED ORDER — ROSUVASTATIN CALCIUM 10 MG PO TABS
10.0000 mg | ORAL_TABLET | Freq: Every day | ORAL | Status: DC
  Administered 2023-12-28 – 2023-12-31 (×4): 10 mg via ORAL
  Filled 2023-12-27 (×4): qty 1

## 2023-12-27 MED ORDER — FUROSEMIDE 40 MG PO TABS
40.0000 mg | ORAL_TABLET | Freq: Every day | ORAL | Status: DC
  Administered 2023-12-28: 40 mg via ORAL
  Filled 2023-12-27: qty 1

## 2023-12-27 MED ORDER — CALCITRIOL 0.25 MCG PO CAPS
0.2500 ug | ORAL_CAPSULE | Freq: Every day | ORAL | Status: DC
  Administered 2023-12-28 – 2023-12-31 (×4): 0.25 ug via ORAL
  Filled 2023-12-27 (×4): qty 1

## 2023-12-27 MED ORDER — ISOSORBIDE MONONITRATE ER 30 MG PO TB24
60.0000 mg | ORAL_TABLET | Freq: Every day | ORAL | Status: DC
  Administered 2023-12-28 – 2023-12-31 (×4): 60 mg via ORAL
  Filled 2023-12-27 (×4): qty 2

## 2023-12-27 MED ORDER — HYDROCODONE-ACETAMINOPHEN 5-325 MG PO TABS
1.0000 | ORAL_TABLET | Freq: Four times a day (QID) | ORAL | Status: DC | PRN
  Administered 2023-12-27 – 2023-12-30 (×3): 1 via ORAL
  Filled 2023-12-27 (×3): qty 1

## 2023-12-27 MED ORDER — GABAPENTIN 300 MG PO CAPS
300.0000 mg | ORAL_CAPSULE | Freq: Three times a day (TID) | ORAL | Status: DC
  Administered 2023-12-27 – 2023-12-30 (×9): 300 mg via ORAL
  Filled 2023-12-27 (×9): qty 1

## 2023-12-27 MED ORDER — ACETAMINOPHEN 325 MG PO TABS: 650.0000 mg | ORAL_TABLET | Freq: Four times a day (QID) | ORAL | Status: DC | PRN

## 2023-12-27 MED ORDER — ONDANSETRON HCL 4 MG/2ML IJ SOLN: 4.0000 mg | Freq: Four times a day (QID) | INTRAMUSCULAR | Status: DC | PRN

## 2023-12-27 MED ORDER — ONDANSETRON HCL 4 MG PO TABS: 4.0000 mg | ORAL_TABLET | Freq: Four times a day (QID) | ORAL | Status: DC | PRN

## 2023-12-27 MED ORDER — QUETIAPINE FUMARATE 25 MG PO TABS
25.0000 mg | ORAL_TABLET | Freq: Every day | ORAL | Status: DC
  Administered 2023-12-27 – 2023-12-28 (×2): 25 mg via ORAL
  Filled 2023-12-27 (×2): qty 1

## 2023-12-27 MED ORDER — INSULIN ASPART 100 UNIT/ML IJ SOLN
0.0000 [IU] | Freq: Three times a day (TID) | INTRAMUSCULAR | Status: DC
  Administered 2023-12-28: 3 [IU] via SUBCUTANEOUS
  Administered 2023-12-28 – 2023-12-29 (×2): 2 [IU] via SUBCUTANEOUS
  Administered 2023-12-30: 3 [IU] via SUBCUTANEOUS
  Administered 2023-12-31: 2 [IU] via SUBCUTANEOUS
  Filled 2023-12-27 (×5): qty 1

## 2023-12-27 MED ORDER — INSULIN ASPART 100 UNIT/ML IJ SOLN
0.0000 [IU] | Freq: Every day | INTRAMUSCULAR | Status: DC
  Administered 2023-12-30: 1 [IU] via SUBCUTANEOUS
  Filled 2023-12-27: qty 1

## 2023-12-27 MED ORDER — CLORAZEPATE DIPOTASSIUM 7.5 MG PO TABS: 7.5000 mg | ORAL_TABLET | Freq: Two times a day (BID) | ORAL | Status: DC | PRN

## 2023-12-27 MED ORDER — PANTOPRAZOLE SODIUM 40 MG PO TBEC
40.0000 mg | DELAYED_RELEASE_TABLET | Freq: Two times a day (BID) | ORAL | Status: DC
  Administered 2023-12-27 – 2023-12-31 (×8): 40 mg via ORAL
  Filled 2023-12-27 (×8): qty 1

## 2023-12-27 NOTE — H&P (Signed)
 History and Physical    Patient: Daniel Kline WGN:[id number] DOB: 01/13/1946 DOA: 12/27/2023 DOS: the patient was seen and examined on 12/28/2023 PCP: Helaine Llanos, MD  Patient coming from: Home  Chief Complaint:  Chief Complaint  Patient presents with   Altered Mental Status   HPI: Daniel Kline is a 78 y.o. male with medical history significant of T2DM, CKD IV, HLD, chronic anemia brought in via EMS from home for evaluation after.  The patient called the police reporting an altercation with a  former coworker (who is deceased) who would not leave his home.  On police arrival, there was no man in the home and it was actually his wife he was fighting with, therefore they called for EMS.  Patient was calm and cooperative in the emergency department and disturbed by the delusion he had previously.  He denies any history of psychiatric disorders, hallucinations, suicidal or homicidal ideation, substance abuse. He reports his twin brother died and he is having an excessively difficult time processing the loss.  He has no neurologic complaints or deficits.  Reports he physically well other than his chronic pain.  Initial CT head without acute intracranial abnormality.  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  Hospitalist was consulted for further evaluation of altered mental status Review of Systems: Review of Systems  Constitutional:  Negative for chills, fever and weight loss.  Eyes:  Negative for blurred vision.  Respiratory:  Negative for cough, sputum production and shortness of breath.   Cardiovascular:  Positive for leg swelling. Negative for chest pain, palpitations and claudication.  Gastrointestinal:  Negative for abdominal pain, blood in stool, constipation, diarrhea, heartburn, nausea and vomiting.  Genitourinary:  Negative for dysuria and frequency.  Musculoskeletal:  Positive for back pain (chronic).  Neurological:  Negative for dizziness, tingling,  sensory change, speech change, focal weakness and headaches.  Psychiatric/Behavioral:  Positive for depression. Negative for memory loss, substance abuse and suicidal ideas. The patient is nervous/anxious. The patient does not have insomnia.     Past Medical History:  Diagnosis Date   Anemia    H/O   Anxiety    Arthritis    Atrial flutter (HCC)    a. s/p post ablation in 04/2017   Chronic kidney disease    Complication of anesthesia    CTCL (cutaneous T-cell lymphoma) (HCC)    Diabetes mellitus without complication (HCC)    Diastolic dysfunction    a. 03/2022 Echo: EF 60-65%, no rwma, GrI DD, nl RV fxn.   Dysplastic nevus 12/19/2017   Right distal lat. forearm near wrist. Severe atypia, close to peripheral margin.   Dysplastic nevus 06/21/2018   Upper back right paraspinal. Severe atypia, peripheral margin involved. Excised 07/11/2018, margins free.   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 forearm. SCCis, hypertrophic.   Past Surgical History:  Procedure Laterality Date   BACK SURGERY  LUMBAR   CARDIAC ELECTROPHYSIOLOGY STUDY AND ABLATION  2019   CATARACT EXTRACTION W/PHACO Right 05/05/2022   Procedure: CATARACT EXTRACTION PHACO AND INTRAOCULAR LENS PLACEMENT (IOC) RIGHT;  Surgeon: Annell Kidney, MD;  Location: [facility];  Service: Ophthalmology;  Laterality: Right;  Diabetic 10.42 01:13.4   COLONOSCOPY WITH PROPOFOL  N/A 04/04/2019   Procedure: COLONOSCOPY WITH PROPOFOL ;  Surgeon: Toledo,  Alphonsus Jeans, MD;  Location: [facility];  Service: Gastroenterology;  Laterality: N/A;   ESOPHAGOGASTRODUODENOSCOPY N/A 11/28/2023   Procedure: EGD (ESOPHAGOGASTRODUODENOSCOPY);  Surgeon: Marnee Sink, MD;  Location: [facility];  Service: Endoscopy;  Laterality: N/A;   I & D EXTREMITY Right 02/01/2020   Procedure: IRRIGATION AND DEBRIDEMENT EXTREMITY with poly exchange;  Surgeon: Arlyne Lame, MD;  Location: [facility];  Service: Orthopedics;  Laterality: Right;   INCISION AND DRAINAGE     BACK-MRSA INFECTION AFTER BACK SURGERY   KNEE ARTHROPLASTY Right 01/28/2020   Procedure: COMPUTER ASSISTED TOTAL KNEE ARTHROPLASTY;  Surgeon: Arlyne Lame, MD;  Location: [facility];  Service: Orthopedics;  Laterality: Right;   MOUTH SURGERY     right elbow surgery     right knee surgery     right shoulder surgery     from polio damage   TONSILLECTOMY     TOTAL HIP ARTHROPLASTY Bilateral 04/2016   Social History:  reports that he quit smoking about 33 years ago. His smoking use included cigars. He has been exposed to tobacco smoke. He quit smokeless tobacco use about 17 years ago. He reports that he does not drink alcohol and does not use drugs.  Allergies  Allergen Reactions   Bee Venom Anaphylaxis   Oxycodone  Other (See Comments)    Delusions   Hydromorphone  Other (See Comments)    hallucinating   Hydroxychloroquine     Other reaction(s): Other (See Comments) He broke out really badly.   Zolpidem Other (See Comments)    Family History  Adopted: Yes    Prior to Admission medications   Medication Sig Start Date End Date Taking? Authorizing Provider  amLODipine  (NORVASC ) 5 MG tablet Take 1 tablet (5 mg total) by mouth daily. 08/11/23   Helaine Llanos, MD  calcitRIOL (ROCALTROL) 0.25 MCG capsule Take 0.25 mcg by mouth daily. 06/15/23 06/14/24  [provider]  carvedilol  (COREG ) 3.125 MG tablet TAKE 1 TABLET BY MOUTH 2 TIMES DAILY WITH A MEAL. 04/21/23   Helaine Llanos, MD   Cholecalciferol  (VITAMIN D -3) 25 MCG (1000 UT) CAPS Take 1,000 Units by mouth daily.    [provider]  clorazepate  (TRANXENE ) 7.5 MG tablet TAKE 1 TABLET BY MOUTH TWICE A DAY AS NEEDED FOR ANXIETY 12/19/23   Helaine Llanos, MD  cyanocobalamin (VITAMIN B12) 500 MCG tablet Take 1,000 mcg by mouth daily.    [provider]  dapagliflozin propanediol (FARXIGA) 5 MG TABS tablet Take 5 mg by mouth daily. Patient not taking: Reported on 12/15/2023    [provider]  EPINEPHrine  0.3 mg/0.3 mL IJ SOAJ injection Inject 0.3 mg into the muscle as needed for anaphylaxis. 01/27/23   Helaine Llanos, MD  Ferrous Sulfate  (IRON PO) Take 1 tablet by mouth at bedtime.    [provider]  furosemide  (LASIX ) 40 MG tablet Take 40 mg by mouth daily.    [provider]  gabapentin  (NEURONTIN ) 300 MG capsule TAKE 1 CAPSULE BY MOUTH 3 TIMES  DAILY 01/31/23   Curt Dover I, MD  glipiZIDE  (GLUCOTROL  XL) 5 MG 24 hr  tablet Take 5 mg by mouth daily with breakfast.    [provider]  glucose blood (ONETOUCH VERIO) test strip Use to check blood sugar once daily 09/27/23   Helaine Llanos, MD  HYDROcodone -acetaminophen  (NORCO/VICODIN) 5-325 MG tablet Take 1 tablet by mouth every 6 (six) hours as needed. for pain 12/15/23   Curt Dover I, MD  hydrocortisone  2.5 % cream Apply topically 3 (three) times daily as needed. 06/17/23   Helaine Llanos, MD  hydrOXYzine  (ATARAX ) 25 MG tablet TAKE ONE TABLET EVERY EIGHT HOURS AS NEEDED 11/17/23   Helaine Llanos, MD  isosorbide  mononitrate (IMDUR ) 60 MG 24 hr tablet Take 1 tablet (60 mg total) by mouth daily before lunch. 05/11/21   Gollan, Timothy J, MD  latanoprost  (XALATAN ) 0.005 % ophthalmic solution Place 1 drop into both eyes at bedtime.    [provider]  losartan  (COZAAR ) 100 MG tablet TAKE 1 TABLET BY MOUTH DAILY Patient not taking: Reported on 12/15/2023 04/21/23   Gollan, Timothy J, MD  MAGNESIUM  PO Take 1  tablet by mouth daily.    [provider]  pantoprazole  (PROTONIX ) 40 MG tablet Take 1 tablet (40 mg total) by mouth 2 (two) times daily. 12/12/23 01/11/24  Alphonsus Jeans, MD  rosuvastatin  (CRESTOR ) 10 MG tablet Take 1 tablet (10 mg total) by mouth daily. 04/07/23   Helaine Llanos, MD  spironolactone  (ALDACTONE ) 25 MG tablet Take 25 mg by mouth daily. Patient not taking: Reported on 12/15/2023    [provider]    Physical Exam: Vitals:   12/27/23 1630 12/27/23 2000 12/27/23 2030 12/27/23 2113  BP: 125/77 (!) 139/92 (!) 159/93 (!) 140/80  Pulse: (!) 54 (!) 54 83 68  Resp: 16 14 16 19   Temp:  98.1 F (36.7 C) 98.1 F (36.7 C) 98.2 F (36.8 C)  TempSrc:  Oral Oral Oral  SpO2: 100% 100% 96% 99%  Weight:      Height:      Physical Exam Vitals reviewed.  Constitutional:      Appearance: He is not ill-appearing or diaphoretic.  HENT:     Head: Normocephalic and atraumatic.  Eyes:     General: No scleral icterus.    Pupils: Pupils are equal, round, and reactive to light.  Cardiovascular:     Rate and Rhythm: Normal rate and regular rhythm.  Pulmonary:     Effort: Pulmonary effort is normal. No respiratory distress.     Breath sounds: Normal breath sounds. No wheezing.  Abdominal:     General: Bowel sounds are normal. There is no distension.     Palpations: Abdomen is soft.     Tenderness: There is no abdominal tenderness.  Musculoskeletal:     Cervical back: Normal range of motion and neck supple.     Right lower leg: Edema present.     Left lower leg: Edema present.     Comments: RLE more swollen than left, b/l calf tenderness and b/l venous stasis changes   Neurological:     Mental Status: He is alert.  Psychiatric:        Attention and Perception: Attention normal.        Mood and Affect: Mood and affect normal.        Behavior: Behavior normal. Behavior is cooperative.        Thought Content: Thought content is delusional. Thought content does not  include suicidal ideation. Thought content does not include homicidal or suicidal plan.  Data Reviewed:   Labs on Admission: I have personally reviewed following labs and imaging studies  CBC: Recent Labs  Lab 12/27/23 1127  WBC 7.7  HGB 8.3*  HCT 26.0*  MCV 100.4*  PLT 168   Basic Metabolic Panel: Recent Labs  Lab 12/27/23 1127  NA 138  K 3.7  CL 103  CO2 26  GLUCOSE 123*  BUN 35*  CREATININE 2.89*  CALCIUM  8.4*   GFR: Estimated Creatinine Clearance: 23.8 mL/min (A) (by C-G formula based on SCr of 2.89 mg/dL (H)). Liver Function Tests: Recent Labs  Lab 12/27/23 1127  AST 10*  ALT 10  ALKPHOS 59  BILITOT 0.5  PROT 6.6  ALBUMIN 3.0*   Recent Labs  Lab 12/27/23 1127  LIPASE 47   Recent Labs  Lab 12/27/23 1127  AMMONIA 23   Coagulation Profile: No results for input(s): "INR", "PROTIME" in the last 168 hours. Cardiac Enzymes: No results for input(s): "CKTOTAL", "CKMB", "CKMBINDEX", "TROPONINI" in the last 168 hours. BNP (last 3 results) No results for input(s): "PROBNP" in the last 8760 hours. HbA1C: Recent Labs    12/27/23 1126  HGBA1C 5.7*   CBG: Recent Labs  Lab 12/27/23 1119 12/27/23 2156 12/27/23 2351  GLUCAP 123* 88 119*   Lipid Profile: No results for input(s): "CHOL", "HDL", "LDLCALC", "TRIG", "CHOLHDL", "LDLDIRECT" in the last 72 hours. Thyroid  Function Tests: No results for input(s): "TSH", "T4TOTAL", "FREET4", "T3FREE", "THYROIDAB" in the last 72 hours. Anemia Panel: No results for input(s): "VITAMINB12", "FOLATE", "FERRITIN", "TIBC", "IRON", "RETICCTPCT" in the last 72 hours. Urine analysis:    Component Value Date/Time   COLORURINE STRAW (A) 12/27/2023 1156   APPEARANCEUR CLEAR (A) 12/27/2023 1156   APPEARANCEUR Clear 10/04/2023 1526   LABSPEC 1.009 12/27/2023 1156   LABSPEC 1.021 10/18/2011 0958   PHURINE 6.0 12/27/2023 1156   GLUCOSEU 50 (A) 12/27/2023 1156   GLUCOSEU Negative 10/18/2011 0958   HGBUR NEGATIVE  12/27/2023 1156   BILIRUBINUR NEGATIVE 12/27/2023 1156   BILIRUBINUR Negative 10/04/2023 1526   BILIRUBINUR Negative 10/18/2011 0958   KETONESUR NEGATIVE 12/27/2023 1156   PROTEINUR 30 (A) 12/27/2023 1156   NITRITE NEGATIVE 12/27/2023 1156   LEUKOCYTESUR NEGATIVE 12/27/2023 1156   LEUKOCYTESUR Negative 10/18/2011 0958    Radiological Exams on Admission: CT HEAD WO CONTRAST ( ) Result Date: 12/27/2023 CLINICAL DATA:  Altered mental status EXAM: CT HEAD WITHOUT CONTRAST TECHNIQUE: Contiguous axial images were obtained from the base of the skull through the vertex without intravenous contrast. RADIATION DOSE REDUCTION: This exam was performed according to the departmental dose-optimization program which includes automated exposure control, adjustment of the mA and/or kV according to patient size and/or use of iterative reconstruction technique. COMPARISON:  CT head 11/25/2023. FINDINGS: Brain: No acute intracranial hemorrhage. No CT evidence of acute infarct. Similar appearance of remote infarct in the right occipital lobe. Nonspecific hypoattenuation in the periventricular and subcortical white matter favored to reflect chronic microvascular ischemic changes. No edema, mass effect, or midline shift. The basilar cisterns are patent. Ventricles: The ventricles are normal. Vascular: Atherosclerotic calcifications of the carotid siphons and intracranial vertebral arteries. No hyperdense vessel. Skull: No acute or aggressive finding. Orbits: Right lens replacement.  Orbits otherwise unremarkable. Sinuses: Mucosal thickening throughout the paranasal sinuses with near complete opacification of the visualized sinuses, increased from prior. Other: Trace fluid in the left mastoid tip. IMPRESSION: No CT evidence of acute intracranial abnormality. 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 Mild chronic microvascular ischemic changes. Paranasal sinus disease as above,  increased from prior. Electronically Signed    By: Denny Flack M.D.   On: 12/27/2023 15:19   DG Chest Portable 1 View Result Date: 12/27/2023 CLINICAL DATA:  Altered mental status EXAM: PORTABLE CHEST 1 VIEW COMPARISON:  10/26/2023 FINDINGS: Stable enlarged cardiac silhouette. Mildly prominent vasculature. Clear lungs with normal vascularity. Mild-to-moderate lower thoracic spine degenerative changes. Marked left glenohumeral degenerative changes and proximal right humerus postoperative changes with a fixation screw. IMPRESSION: Stable cardiomegaly with interval mild pulmonary vascular congestion. Electronically Signed   By: Catherin Closs M.D.   On: 12/27/2023 15:16       Assessment and Plan: No notes have been filed under this hospital service. 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 With similar symptoms during hospitalizations last month. During my encounter he was aware of his delusions earlier in the day, calm, appropriate.  Recently lost his twin brother and struggling emotionally.  Has been out of his tranxene  for about 2 weeks  Medications reviewed, on chronic pain regimen for years without changes  - delirium precautions  - Psychiatry consult if available. - may benefit from SSRI  - resume tranxene   CKDIV - Cr seems to be lower than previous baseline since multiple medications held upon discharge about a month ago. Will continue to hold these (aldactone , losartan , farxiga)  - cont calcitriol  - monitor  - f/u Nephrology   T2DM - SSI, f/u A1c, consistent carb diet   HTN - cont amlodipine , carvedilol    Anemia  - chronic, hx duodenitis and CKD  - cont BID protonix   - monitor transfuse as indicated   Chronic pain  - gabapentin , hydrocodone ,   - PDMP reviewed  - continue PT while here   Heparin  DVT ppx  Consistent carb diet  Monitor/replace electrolytes No IVF  Advance Care Planning:   Code Status: Full Code discussed with patient at  time of admission    Severity of Illness: The appropriate patient status for this patient is OBSERVATION. Observation status is judged to be reasonable and necessary in order to provide the required intensity of service to ensure the patient's safety. The patient's presenting symptoms, physical exam findings, and initial radiographic and laboratory data in the context of their medical condition is felt to place them at decreased risk for further clinical deterioration. Furthermore, it is anticipated that the patient will be medically stable for discharge from the hospital within 2 midnights of admission.   Author: Charlesetta Connors, DO 12/28/2023 12:18 AM  For on call review [email or web address].

## 2023-12-27 NOTE — Telephone Encounter (Signed)
 Pt wife calling stating that pt called 911. Per wife pt is altered, states that pt does not recognize the caller. EMS on scene, transporting to ED.   Copied from CRM [id number]. Topic: Clinical - Red Word Triage >> Dec 27, 2023 10:50 AM Dimple Francis wrote: Red Word that prompted transfer to Nurse Triage: Wife calling- police and EMS are at home right now. Patient is very confused, does not know who his wife is, wanting to know if patient should go to the ER.. Reason for Disposition . Patient sounds very sick or weak to the triager  Protocols used: Confusion - Delirium-A-AH

## 2023-12-27 NOTE — Telephone Encounter (Signed)
 Is in the ER now---will await their evaluation

## 2023-12-27 NOTE — ED Notes (Signed)
 Patient's wife [name] called and was advised of husband's current condition, and the room he has been assigned to on the floor.

## 2023-12-27 NOTE — ED Triage Notes (Signed)
 Pt to ED via POV. Pt called PD to report that there was a man in his house, however, the person in his house was his wife. EMS was called out by PD. Pt is reporting that the "man" has been in his house since last night. Pt states that his wife left this morning and that the "man" has his wifes phone and he is not able to call her. 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 Pt is able to answer all orientation questions correctly.   According to officers on scene, "Josiah Nigh" is no longer alive. Pt's wife reports that pts twin brother and brother in law have passed away within the last 3 months and that pt wakes up in the middle of the night screaming his brothers name.   Pt is calm and cooperative on arrival.

## 2023-12-27 NOTE — Progress Notes (Signed)
 Patient profile not completed due to confusion.

## 2023-12-27 NOTE — ED Provider Notes (Signed)
 [facility] Provider Note    Event Date/Time   First MD Initiated Contact with Patient 12/27/23 1120     (approximate)   History   Altered Mental Status  Pt to ED via POV. Pt called PD to report that there was a man in his house, however, the person in his house was his wife. EMS was called out by PD. Pt is reporting that the "man" has been in his house since last night. Pt states that his wife left this morning and that the "man" has his wifes phone and he is not able to call her. 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 Pt is able to answer all orientation questions correctly.   According to officers on scene, "Josiah Nigh" is no longer alive. Pt's wife reports that pts twin brother and brother in law have passed away within the last 3 months and that pt wakes up in the middle of the night screaming his brothers name.   Pt is calm and cooperative on arrival.    HPI Daniel Kline is a 78 y.o. male PMH CKD 3, hypertension, hyperlipidemia, diabetes, diastolic heart failure, T-cell lymphoma, rheumatoid arthritis, paroxysmal A-fib/flutter presents for evaluation of altered mental status - Per EMS, patient reportedly thought his wife was a man named Josiah Nigh (a former Radio broadcast assistant) -Patient denies any physical complaints.  Is aware that he may have been hallucinating and concerned about this.  Denies any substance use of any kind other than his prescribed medications.     Physical Exam   Triage Vital Signs: ED Triage Vitals  Encounter Vitals Group     BP --      Systolic BP Percentile --      Diastolic BP Percentile --      Pulse Rate 12/27/23 1121 68     Resp 12/27/23 1121 17     Temp --      Temp src --      SpO2 12/27/23 1121 100 %     Weight 12/27/23 1122 236 lb (107 kg)     Height 12/27/23 1122 5\' 6"  (1.676 m)     Head Circumference --      Peak Flow --      Pain Score 12/27/23 1122 5     Pain Loc  --      Pain Education --      Exclude from Growth Chart --     Most recent vital signs: Vitals:   12/27/23 1230 12/27/23 1604  BP:  129/78  Pulse: (!) 57 66  Resp: 16 16  Temp:  98 F (36.7 C)  SpO2: 96% 98%     General: Awake, no distress.  CV:  Good peripheral perfusion. RRR, RP 2+ Resp:  Normal effort. CTAB Abd:  No distention. Nontender to deep palpation throughout. + Reducible umbilical hernia. Neuro: Aox4, CN II-XII intact, FNF wnl, finger taps fast b/l, 5/5 strength in bilateral finger extension/grip, arm flexion/extension, EHL/FHL. BUE AG 10+ sec no drift, BLE AG 5+ sec no drift.  SILT.  Other:  Notable edema to bilateral lower extremities.  Small wound to anterior left lower shin with mild discharge that appears somewhat purulent, no significant surrounding erythema however right lower leg does have notable erythema and is mildly warm to touch.   ED Results / Procedures / Treatments   Labs (all labs ordered are listed, but only abnormal results  are displayed) Labs Reviewed  COMPREHENSIVE METABOLIC PANEL WITH GFR - Abnormal; Notable for the following components:      Result Value   Glucose, Bld 123 (*)    BUN 35 (*)    Creatinine, Ser 2.89 (*)    Calcium  8.4 (*)    Albumin 3.0 (*)    AST 10 (*)    GFR, Estimated 21 (*)    All other components within normal limits  CBC - Abnormal; Notable for the following components:   RBC 2.59 (*)    Hemoglobin 8.3 (*)    HCT 26.0 (*)    MCV 100.4 (*)    RDW 17.5 (*)    All other components within normal limits  URINALYSIS, ROUTINE W REFLEX MICROSCOPIC - Abnormal; Notable for the following components:   Color, Urine STRAW (*)    APPearance CLEAR (*)    Glucose, UA 50 (*)    Protein, ur 30 (*)    All other components within normal limits  SALICYLATE LEVEL - Abnormal; Notable for the following components:   Salicylate Lvl <7.0 (*)    All other components within normal limits  ACETAMINOPHEN  LEVEL - Abnormal; Notable for  the following components:   Acetaminophen  (Tylenol ), Serum <10 (*)    All other components within normal limits  URINE DRUG SCREEN, QUALITATIVE ([facility] ONLY) - Abnormal; Notable for the following components:   Benzodiazepine, Ur Scrn POSITIVE (*)    All other components within normal limits  CBG MONITORING, ED - Abnormal; Notable for the following components:   Glucose-Capillary 123 (*)    All other components within normal limits  AEROBIC/ANAEROBIC CULTURE W GRAM STAIN (SURGICAL/DEEP WOUND)  LIPASE, BLOOD  AMMONIA  ETHANOL  CBG MONITORING, ED     EKG  Ecg = sinus rhythm, rate 66, no gross ST elevation or depression, no significant repolarization abnormality, normal axis, normal intervals.  No evidence of ischemia on my read.  Interpretation somewhat limited by baseline artifact.   RADIOLOGY Radiology interpreted by myself and radiology report reviewed.   PROCEDURES:  Critical Care performed: No  Procedures   MEDICATIONS ORDERED IN ED: Medications - No data to display   IMPRESSION / MDM / ASSESSMENT AND PLAN / ED COURSE  I reviewed the triage vital signs and the nursing notes.                              DDX/MDM/AP: Differential diagnosis includes, but is not limited to, metabolic or infectious encephalopathy, no evidence of stroke on my eval, doubt concomitant substance use.  Unlikely intracranial hemorrhage.  Consider uremia in setting of known CKD.  Patient may have cellulitis on right lower leg and possible small infected wound on left lower leg.  Plan: - Labs - EKG - CT head - Chest x-ray - Reassess.  Patient's presentation is most consistent with acute presentation with potential threat to life or bodily function.  The patient is on the cardiac monitor to evaluate for evidence of arrhythmia and/or significant heart rate changes.  ED course below.  Workup unremarkable with no clear etiology of patient's confusion, suspect likely delirium.  Not clinically  concern for meningitis/encephalitis at this time.  No clear evidence of underlying infection or significant electrolyte abnormality.  Pending admission to hospitalist service for further workup of AMS, do not suspect primary psychiatric disorder at this time.  Clinical Course as of 12/27/23 1609  Tue Dec 27, 2023  1144  CBC with no leukocytosis, improved anemia [MM]  1217 CMP with improved renal function and BUN, do not suspect uremic encephalopathy as etiology of presentation [MM]  1221 CT head with no intracranial hemorrhage on my interpretation, formal radiology read pending [MM]  1530 UA w/ no e/o infxn [MM]  1531 CTH: IMPRESSION: No CT evidence of acute intracranial abnormality.  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  Mild chronic microvascular ischemic changes.  Paranasal sinus disease as above, increased from prior.   [MM]    Clinical Course User Index [MM] Collis Deaner, MD     FINAL CLINICAL IMPRESSION(S) / ED DIAGNOSES   Final diagnoses:  Altered mental status, unspecified altered mental status type     Rx / DC Orders   ED Discharge Orders     None        Note:  This document was prepared using Dragon voice recognition software and may include unintentional dictation errors.   Collis Deaner, MD 12/27/23 [phone]

## 2023-12-28 DIAGNOSIS — N179 Acute kidney failure, unspecified: Secondary | ICD-10-CM | POA: Diagnosis not present

## 2023-12-28 DIAGNOSIS — K257 Chronic gastric ulcer without hemorrhage or perforation: Secondary | ICD-10-CM | POA: Diagnosis not present

## 2023-12-28 DIAGNOSIS — L03115 Cellulitis of right lower limb: Secondary | ICD-10-CM | POA: Diagnosis not present

## 2023-12-28 DIAGNOSIS — D638 Anemia in other chronic diseases classified elsewhere: Secondary | ICD-10-CM

## 2023-12-28 DIAGNOSIS — F05 Delirium due to known physiological condition: Secondary | ICD-10-CM

## 2023-12-28 DIAGNOSIS — D631 Anemia in chronic kidney disease: Secondary | ICD-10-CM | POA: Diagnosis present

## 2023-12-28 DIAGNOSIS — I130 Hypertensive heart and chronic kidney disease with heart failure and stage 1 through stage 4 chronic kidney disease, or unspecified chronic kidney disease: Secondary | ICD-10-CM | POA: Diagnosis present

## 2023-12-28 DIAGNOSIS — F0392 Unspecified dementia, unspecified severity, with psychotic disturbance: Secondary | ICD-10-CM | POA: Diagnosis present

## 2023-12-28 DIAGNOSIS — B999 Unspecified infectious disease: Secondary | ICD-10-CM | POA: Diagnosis not present

## 2023-12-28 DIAGNOSIS — E66812 Obesity, class 2: Secondary | ICD-10-CM

## 2023-12-28 DIAGNOSIS — G9341 Metabolic encephalopathy: Secondary | ICD-10-CM | POA: Diagnosis not present

## 2023-12-28 DIAGNOSIS — Z87891 Personal history of nicotine dependence: Secondary | ICD-10-CM | POA: Diagnosis not present

## 2023-12-28 DIAGNOSIS — N184 Chronic kidney disease, stage 4 (severe): Secondary | ICD-10-CM

## 2023-12-28 DIAGNOSIS — Z79899 Other long term (current) drug therapy: Secondary | ICD-10-CM | POA: Diagnosis not present

## 2023-12-28 DIAGNOSIS — G9340 Encephalopathy, unspecified: Secondary | ICD-10-CM | POA: Diagnosis present

## 2023-12-28 DIAGNOSIS — M069 Rheumatoid arthritis, unspecified: Secondary | ICD-10-CM | POA: Diagnosis present

## 2023-12-28 DIAGNOSIS — G4734 Idiopathic sleep related nonobstructive alveolar hypoventilation: Secondary | ICD-10-CM | POA: Diagnosis not present

## 2023-12-28 DIAGNOSIS — R0902 Hypoxemia: Secondary | ICD-10-CM | POA: Diagnosis present

## 2023-12-28 DIAGNOSIS — Z96643 Presence of artificial hip joint, bilateral: Secondary | ICD-10-CM | POA: Diagnosis present

## 2023-12-28 DIAGNOSIS — I5032 Chronic diastolic (congestive) heart failure: Secondary | ICD-10-CM | POA: Diagnosis not present

## 2023-12-28 DIAGNOSIS — I10 Essential (primary) hypertension: Secondary | ICD-10-CM | POA: Diagnosis not present

## 2023-12-28 DIAGNOSIS — Z96651 Presence of right artificial knee joint: Secondary | ICD-10-CM | POA: Diagnosis present

## 2023-12-28 DIAGNOSIS — R443 Hallucinations, unspecified: Secondary | ICD-10-CM | POA: Diagnosis not present

## 2023-12-28 DIAGNOSIS — Z7984 Long term (current) use of oral hypoglycemic drugs: Secondary | ICD-10-CM | POA: Diagnosis not present

## 2023-12-28 DIAGNOSIS — E11649 Type 2 diabetes mellitus with hypoglycemia without coma: Secondary | ICD-10-CM

## 2023-12-28 DIAGNOSIS — G8929 Other chronic pain: Secondary | ICD-10-CM | POA: Diagnosis present

## 2023-12-28 DIAGNOSIS — E785 Hyperlipidemia, unspecified: Secondary | ICD-10-CM | POA: Diagnosis present

## 2023-12-28 DIAGNOSIS — I480 Paroxysmal atrial fibrillation: Secondary | ICD-10-CM | POA: Diagnosis present

## 2023-12-28 DIAGNOSIS — J449 Chronic obstructive pulmonary disease, unspecified: Secondary | ICD-10-CM | POA: Diagnosis present

## 2023-12-28 DIAGNOSIS — E1122 Type 2 diabetes mellitus with diabetic chronic kidney disease: Secondary | ICD-10-CM | POA: Diagnosis present

## 2023-12-28 DIAGNOSIS — Z85828 Personal history of other malignant neoplasm of skin: Secondary | ICD-10-CM | POA: Diagnosis not present

## 2023-12-28 DIAGNOSIS — N189 Chronic kidney disease, unspecified: Secondary | ICD-10-CM | POA: Diagnosis not present

## 2023-12-28 LAB — BASIC METABOLIC PANEL WITH GFR
Anion gap: 7 (ref 5–15)
BUN: 36 mg/dL — ABNORMAL HIGH (ref 8–23)
CO2: 26 mmol/L (ref 22–32)
Calcium: 8.7 mg/dL — ABNORMAL LOW (ref 8.9–10.3)
Chloride: 106 mmol/L (ref 98–111)
Creatinine, Ser: 2.94 mg/dL — ABNORMAL HIGH (ref 0.61–1.24)
GFR, Estimated: 21 mL/min — ABNORMAL LOW (ref >60)
Glucose, Bld: 115 mg/dL — ABNORMAL HIGH (ref 70–99)
Potassium: 3.6 mmol/L (ref 3.5–5.1)
Sodium: 139 mmol/L (ref 135–145)

## 2023-12-28 LAB — CBC
HCT: 24.3 % — ABNORMAL LOW (ref 39.0–52.0)
Hemoglobin: 7.7 g/dL — ABNORMAL LOW (ref 13.0–17.0)
MCH: 31.7 pg (ref 26.0–34.0)
MCHC: 31.7 g/dL (ref 30.0–36.0)
MCV: 100 fL (ref 80.0–100.0)
Platelets: 146 10*3/uL — ABNORMAL LOW (ref 150–400)
RBC: 2.43 MIL/uL — ABNORMAL LOW (ref 4.22–5.81)
RDW: 17.2 % — ABNORMAL HIGH (ref 11.5–15.5)
WBC: 6.2 10*3/uL (ref 4.0–10.5)
nRBC: 0 % (ref 0.0–0.2)

## 2023-12-28 LAB — C DIFFICILE QUICK SCREEN W PCR REFLEX
C Diff antigen: NEGATIVE
C Diff interpretation: NOT DETECTED
C Diff toxin: NEGATIVE

## 2023-12-28 LAB — GLUCOSE, CAPILLARY
Glucose-Capillary: 63 mg/dL — ABNORMAL LOW (ref 70–99)
Glucose-Capillary: 66 mg/dL — ABNORMAL LOW (ref 70–99)
Glucose-Capillary: 80 mg/dL (ref 70–99)
Glucose-Capillary: 154 mg/dL — ABNORMAL HIGH (ref 70–99)
Glucose-Capillary: 130 mg/dL — ABNORMAL HIGH (ref 70–99)
Glucose-Capillary: 160 mg/dL — ABNORMAL HIGH (ref 70–99)

## 2023-12-28 MED ORDER — FOLIC ACID 1 MG PO TABS
1.0000 mg | ORAL_TABLET | Freq: Every day | ORAL | Status: DC
  Administered 2023-12-28 – 2023-12-31 (×4): 1 mg via ORAL
  Filled 2023-12-28 (×4): qty 1

## 2023-12-28 MED ORDER — CEFAZOLIN SODIUM-DEXTROSE 2-4 GM/100ML-% IV SOLN
2.0000 g | Freq: Two times a day (BID) | INTRAVENOUS | Status: DC
  Administered 2023-12-28 – 2023-12-31 (×7): 2 g via INTRAVENOUS
  Filled 2023-12-28 (×7): qty 100

## 2023-12-28 MED ORDER — FUROSEMIDE 40 MG PO TABS
40.0000 mg | ORAL_TABLET | Freq: Two times a day (BID) | ORAL | Status: DC
  Administered 2023-12-28 – 2023-12-29 (×3): 40 mg via ORAL
  Filled 2023-12-28 (×4): qty 1

## 2023-12-28 MED ORDER — MUPIROCIN CALCIUM 2 % EX CREA
TOPICAL_CREAM | Freq: Every day | CUTANEOUS | Status: DC
  Filled 2023-12-28: qty 15

## 2023-12-28 MED ORDER — HYDROCORTISONE 1 % EX CREA
TOPICAL_CREAM | Freq: Three times a day (TID) | CUTANEOUS | Status: DC | PRN
  Administered 2023-12-28: 1 via TOPICAL
  Filled 2023-12-28: qty 28

## 2023-12-28 NOTE — Consult Note (Addendum)
 WOC Nurse Consult Note: Consult requested for left leg wound.  Secure chat message sent to the primary team as follows, "Please note that the WOC nursing team is utilizing a standardized work plan to manage patient consults. We are triaging consults and will try to see the patients within 48 hours. Wound photos in the patient's chart allow us  to consult on the patient in the most efficient and timely manner."   Post note 1200: Consult performed remotely after review of progress notes and discussed via Secure chat with primary team; wound is described as a  full thickness abrasion.  According to the bedside nurses' wound care flow sheet, wound is yellow and 5X3cm.   Topical treatment orders provided for bedside nurses to perform as follows: Bactroban  to left leg wound Q day, then cover with foam dressing. Change foam dressing Q 3 days or PRN soiling.  Please re-consult if further assistance is needed.  Thank-you,  Wiliam Harder MSN, RN, CWOCN, [place], CNS [phone]

## 2023-12-28 NOTE — Consult Note (Signed)
 [facility] Psychiatric Consult Initial  Patient Name: .Daniel Kline  MRN: [id number]  DOB: 17-Aug-1945  Consult Order details:  Orders (From admission, onward)     Start     Ordered   12/28/23 1050  IP CONSULT TO PSYCHIATRY       Comments: Dr Coleman Daughters  Ordering Provider: Verla Glaze, MD  Provider:  Aurelia Blotter, MD  Question Answer Comment  Location [facility]   Reason for Consult? hallucination, viaual and possible auditory.      12/28/23 1050             Mode of Visit: In person    Psychiatry Consult Evaluation  Service Date: Dec 28, 2023 LOS:  LOS: 0 days  Chief Complaint visual hallucinations/illusions  Primary Psychiatric Diagnoses  Delirium due to medical problems, infection 2.  Brief psychotic episode 3.    Assessment  Daniel Kline is a 78 y.o. male admitted: Medicallyfor 12/27/2023 11:12 AM with medical history significant of T2DM, CKD IV, HLD, chronic anemia brought in via EMS from home for evaluation after. The patient called the police reporting an altercation with a former coworker (who is deceased) who would not leave his home. On police arrival, there was no man in the home and it was actually his wife he was fighting with, therefore they called for EMS. Psychiatry is consulted to evaluate the hallucinations/depression.  On assessment patient did inform the provider that he lost his brother 3 weeks ago and is still grieving the loss.  He acknowledged the illusion of thinking his ex coworker visited him at the house and arguing with him.  He denies current auditory/visual hallucinations, denies suicidal/homicidal ideation/plan.  He reports having any such confusion or episodes of misinterpretation since he has been in the hospital.  No safety concerns at this time and patient is psychiatrically at baseline.  The possibility of delusions that happened probably due to any underlying delirium coming from the possibility of  cellulitis or initial stages of dementia.  Patient is noted to have breathing problems throughout the interview and patient does report history of using CPAP but recently stopped using it saying he is sleeping well on the recliner.  He did report being sleep deprived for many nights since he got the news of his brother passed.     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  If patient continues to have episodes of hallucinations will recommend to titrated up to 50 mg nightly.  ## Medical Decision Making Capacity: Not specifically addressed in this encounter  ## Further Work-up:  --  -- most recent EKG on 5/20 had QtC of 412    ## Disposition:-- There are no psychiatric contraindications to discharge at this time  ## Behavioral / Environmental: -Delirium Precautions: Delirium Interventions for Nursing and Staff: - RN to open blinds every AM. - To Bedside: Glasses, hearing aide, and pt's own shoes. Make available to patients. when possible and encourage use. - Encourage  po fluids when appropriate, keep fluids within reach. - OOB to chair with meals. - Passive ROM exercises to all extremities with AM & PM care. - RN to assess orientation to person, time and place QAM and PRN. - Recommend extended visitation hours with familiar family/friends as feasible. - Staff to minimize disturbances at night. Turn off television when pt asleep or when not in use.    ## Safety and  Observation Level:  - Based on my clinical evaluation, I estimate the patient to be at LOW risk of self harm in the current setting. - At this time, we recommend  routine. This decision is based on my review of the chart including patient's history and current presentation, interview of the patient, mental status examination, and consideration of suicide risk including evaluating suicidal ideation, plan, intent, suicidal or self-harm behaviors, risk factors, and protective factors. This judgment is based on our ability to directly address suicide risk, implement suicide prevention strategies, and develop a safety plan while the patient is in the clinical setting. Please contact our team if there is a concern that risk level has changed.  CSSR Risk Category:C-SSRS RISK CATEGORY: No Risk  Suicide Risk Assessment: Patient has following modifiable risk factors for suicide: lack of access to outpatient mental health resources, which we are addressing by providing outpatient appointments. Patient has following non-modifiable or demographic risk factors for suicide: male gender Patient has the following protective factors against suicide: Supportive family  Thank you for this consult request. Recommendations have been communicated to the primary team.  We will sign off at this time.  Please reach out to us  with any other or new concerns through [employer].  Aurelia Blotter, MD       History of Present Illness  Daniel Kline is a 78 y.o. male admitted: Medicallyfor 12/27/2023 11:12 AM with medical history significant of T2DM, CKD IV, HLD, chronic anemia brought in via EMS from home for evaluation after. The patient called the police reporting an altercation with a former coworker (who is deceased) who would not leave his home. On police arrival, there was no man in the home and it was actually his wife he was fighting with, therefore they called for EMS. Psychiatry is consulted to evaluate the  hallucinations/depression.  Patient Report:  Patient is noted to be resting in bed.  He was awake, alert and oriented x 3.  He reports that he remembers the argument he was having thinking it was his ex Radio broadcast assistant.  He then realized it was his wife and the ex coworker never came to his house.  Patient reports feeling frustrated about this episode and unable to understand why it happened.  He did acknowledge having a wound from a fall on his leg that is sore and red.  Patient also became very tearful and joked upon tears talking about his brother who passed away 3 weeks ago.  He reports that his wife informed him 10 days after his brother dying about the death.  Patient reports that his brother struggled with cancer and was not doing well in the hospital but his brother told his family not to tell the patient as he knew he would not have ability to tolerate it.  Patient was sobbing.  He reports feeling sad about the situation but denies feeling hopeless or worthless.  He reports he is worth to the god and he believes in life after death.  He denies any active suicidal/homicidal ideation/plan.  He denies feeling  anxious or having any panic attacks.  He denies auditory/visual hallucinations.  He does report having history of COPD and recently worsening shortness of breath.  He denies any history of abuse and denies nightmares/flashbacks.    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 Hx: None reported Living Situation: With wife Spiritual Hx: Believes in God Access to weapons/lethal means: Denies  Substance History Alcohol: Denies  Tobacco: Denies Illicit drugs: Denies Prescription drug abuse: Denies Rehab hx: Denies  Exam Findings  Physical Exam: Reviewed and agree with the physical exam findings conducted by the medical provider Vital Signs:  Temp:  [98.1 F (36.7 C)-98.3 F (36.8 C)] 98.2 F (36.8 C) (05/21 0731) Pulse Rate:  [51-83] 55 (05/21 0731) Resp:  [14-19] 18 (05/21 0731) BP: (117-159)/(55-93) 133/59 (05/21 0731) SpO2:  [96 %-100 %] 96 % (05/21 0731) Blood pressure (!) 133/59, pulse (!) 55, temperature 98.2 F (36.8 C), resp. rate 18, height 5\' 6"  (1.676 m), weight 107 kg, SpO2 96%. Body mass index is 38.09 kg/m.    Mental Status Exam: General Appearance: Disheveled  Orientation:  Other:  to self, location,situation, president  Memory:  Immediate;   Fair Recent;   Fair Remote;   poor  Concentration:  Concentration: Fair and Attention Span: Poor  Recall:  Fair  Attention  Fair  Eye Contact:  Fair  Speech:  Garbled  Language:  Fair  Volume:  Normal  Mood: fine  Affect:  Congruent  Thought Process:  Coherent  Thought Content:  Logical  Suicidal Thoughts:  No  Homicidal Thoughts:  No  Judgement:  Fair  Insight:  Fair  Psychomotor Activity:  Decreased  Akathisia:  No  Fund of Knowledge:  Fair      Assets:  Desire for Improvement  Cognition:  Impaired,  Mild  ADL's:  Impaired  AIMS (if indicated):        Other History   These have been pulled in through the EMR, reviewed, and updated if appropriate.  Family History:  The patient's family history is not on file. He was adopted.  Medical History: Past Medical History:  Diagnosis Date   Anemia    H/O   Anxiety    Arthritis    Atrial flutter (HCC)    a. s/p post ablation in 04/2017   Chronic kidney disease     Complication of anesthesia    CTCL (cutaneous T-cell lymphoma) (HCC)    Diabetes mellitus without complication (HCC)    Diastolic dysfunction    a. 03/2022 Echo: EF 60-65%, no rwma, GrI DD, nl RV fxn.   Dysplastic nevus 12/19/2017   Right distal lat. forearm near wrist. Severe atypia, close to peripheral margin.   Dysplastic nevus 06/21/2018   Upper back right paraspinal. Severe atypia, peripheral margin involved. Excised 07/11/2018, margins free.   Family history of adverse reaction to anesthesia    PT WAS ADOPTED   HLD (hyperlipidemia)    HTN (hypertension)    Hx of dysplastic  nevus 2019   multiple sites   Hx of squamous cell carcinoma 01/18/2018   R mid lateral forearm   MRSA (methicillin resistant Staphylococcus aureus)    after back surgery   OSA (obstructive sleep apnea)    USES BIPAP   Polio    POLIOMYELITIS 01/12/2010   Right arm affected   PONV (postoperative nausea and vomiting)    Squamous cell carcinoma of skin 12/19/2017   Right mid lat. forearm. SCCis, hypertrophic.    Surgical History: Past Surgical History:  Procedure Laterality Date   BACK SURGERY     LUMBAR   CARDIAC ELECTROPHYSIOLOGY STUDY AND ABLATION  2019   CATARACT EXTRACTION W/PHACO Right 05/05/2022   Procedure: CATARACT EXTRACTION PHACO AND INTRAOCULAR LENS PLACEMENT (IOC) RIGHT;  Surgeon: Annell Kidney, MD;  Location: [facility];  Service: Ophthalmology;  Laterality: Right;  Diabetic 10.42 01:13.4   COLONOSCOPY WITH PROPOFOL  N/A 04/04/2019   Procedure: COLONOSCOPY WITH PROPOFOL ;  Surgeon: Toledo, Alphonsus Jeans, MD;  Location: [facility]COPY;  Service: Gastroenterology;  Laterality: N/A;   ESOPHAGOGASTRODUODENOSCOPY N/A 11/28/2023   Procedure: EGD (ESOPHAGOGASTRODUODENOSCOPY);  Surgeon: Marnee Sink, MD;  Location: [facility];  Service: Endoscopy;  Laterality: N/A;   I & D EXTREMITY Right 02/01/2020   Procedure: IRRIGATION AND DEBRIDEMENT EXTREMITY with poly exchange;  Surgeon: Arlyne Lame, MD;  Location: [facility] ORS;  Service: Orthopedics;  Laterality: Right;   INCISION AND DRAINAGE     BACK-MRSA INFECTION AFTER BACK SURGERY   KNEE ARTHROPLASTY Right 01/28/2020   Procedure: COMPUTER ASSISTED TOTAL KNEE ARTHROPLASTY;  Surgeon: Arlyne Lame, MD;  Location: [facility];  Service: Orthopedics;  Laterality: Right;   MOUTH SURGERY     right elbow surgery     right knee surgery     right shoulder surgery     from polio damage   TONSILLECTOMY     TOTAL HIP ARTHROPLASTY Bilateral 04/2016     Medications:   Current Facility-Administered Medications:    acetaminophen  (TYLENOL ) tablet 650 mg, 650 mg, Oral, Q6H PRN **OR** acetaminophen  (TYLENOL ) suppository 650 mg, 650 mg, Rectal, Q6H PRN, Krugh, Marissa C, DO   amLODipine  (NORVASC ) tablet 5 mg, 5 mg, Oral, Daily, Krugh, Marissa C, DO, 5 mg at 12/28/23 1013   calcitRIOL (ROCALTROL) capsule 0.25 mcg, 0.25 mcg, Oral, Daily, Krugh, Marissa C, DO, 0.25 mcg at 12/28/23 1013   carvedilol  (COREG ) tablet 3.125 mg, 3.125 mg, Oral, BID WC, Krugh, Marissa C, DO, 3.125 mg at 12/28/23 1013   ceFAZolin  (ANCEF ) IVPB 2g/100 mL premix, 2 g, Intravenous, Q12H, Wieting, Richard, MD, Last Rate: 200 mL/hr at 12/28/23 1240, 2 g at 12/28/23 1240   clorazepate  (TRANXENE ) tablet 7.5 mg, 7.5 mg, Oral, BID PRN, Krugh, Marissa C, DO   folic acid  (FOLVITE ) tablet 1 mg, 1 mg, Oral, Daily, Krugh, Marissa C, DO, 1 mg at 12/28/23 1013   furosemide  (LASIX ) tablet 40 mg, 40 mg, Oral, BID, Wieting, Richard, MD   gabapentin  (NEURONTIN ) capsule 300 mg, 300 mg, Oral, TID, Krugh, Marissa C, DO, 300 mg at 12/28/23 1014   heparin  injection 5,000 Units, 5,000 Units, Subcutaneous, Q8H, Krugh, Marissa C, DO, 5,000 Units at 12/28/23 1436   HYDROcodone -acetaminophen  (NORCO/VICODIN) 5-325 MG per tablet 1 tablet, 1 tablet, Oral, Q6H PRN, Krugh, Marissa C, DO, 1 tablet at 12/27/23 2205   hydrocortisone  cream 1 %, , Topical, TID PRN, Krugh, Marissa C, DO, 1 Application at 12/28/23  0321   insulin  aspart (novoLOG ) injection 0-15 Units, 0-15 Units, Subcutaneous, TID WC,  Charlesetta Connors, DO, 3 Units at 12/28/23 1240   insulin  aspart (novoLOG ) injection 0-5 Units, 0-5 Units, Subcutaneous, QHS, Krugh, Marissa C, DO   isosorbide  mononitrate (IMDUR ) 24 hr tablet 60 mg, 60 mg, Oral, QAC lunch, Krugh, Marissa C, DO, 60 mg at 12/28/23 1240   mupirocin  cream (BACTROBAN ) 2 %, , Topical, Daily, Verla Glaze, MD, Given at 12/28/23 1437   ondansetron  (ZOFRAN ) tablet 4 mg, 4 mg, Oral, Q6H PRN **OR** ondansetron  (ZOFRAN ) injection 4 mg, 4 mg, Intravenous, Q6H PRN, Krugh, Marissa C, DO   pantoprazole  (PROTONIX ) EC tablet 40 mg, 40 mg, Oral, BID, Krugh, Marissa C, DO, 40 mg at 12/28/23 1013   QUEtiapine (SEROQUEL) tablet 25 mg, 25 mg, Oral, QHS, Krugh, Marissa C, DO, 25 mg at 12/27/23 2157   rosuvastatin  (CRESTOR ) tablet 10 mg, 10 mg, Oral, Daily, Krugh, Marissa C, DO, 10 mg at 12/28/23 1013  Allergies: Allergies  Allergen Reactions   Bee Venom Anaphylaxis   Oxycodone  Other (See Comments)    Delusions   Hydromorphone  Other (See Comments)    hallucinating   Hydroxychloroquine     Other reaction(s): Other (See Comments) He broke out really badly.   Zolpidem Other (See Comments)    Mairyn Lenahan, MD

## 2023-12-28 NOTE — Assessment & Plan Note (Addendum)
 Definite visual hallucination.  Likely also auditory hallucination with him arguing with a person that was not there.  Also with acute metabolic encephalopathy.  Appreciate psychiatric consultation.  Infection may be playing a role.  Patient was out of his Tranxene  but does not take all the time.  Suspect patient has an underlying dementia.  Patient had a good night with Seroquel 50 mg nightly.  Continue Seroquel 50 mg nightly upon discharge.

## 2023-12-28 NOTE — Assessment & Plan Note (Deleted)
 Creatinine 3.10 with a GFR of 20

## 2023-12-28 NOTE — Plan of Care (Signed)
  Problem: Education: Goal: Ability to describe self-care measures that may prevent or decrease complications (Diabetes Survival Skills Education) will improve Outcome: Not Progressing   Problem: Coping: Goal: Ability to adjust to condition or change in health will improve Outcome: Progressing   Problem: Nutritional: Goal: Maintenance of adequate nutrition will improve Outcome: Progressing   Problem: Skin Integrity: Goal: Risk for impaired skin integrity will decrease Outcome: Not Progressing

## 2023-12-28 NOTE — Plan of Care (Signed)

## 2023-12-28 NOTE — Assessment & Plan Note (Signed)
 Holding methotrexate.

## 2023-12-28 NOTE — Assessment & Plan Note (Addendum)
 Hemoglobin up to 8.8 after transfusion on 5/22.

## 2023-12-28 NOTE — Hospital Course (Addendum)
 78 y.o. male with medical history significant of T2DM, CKD IV, HLD, chronic anemia brought in via EMS from home for evaluation after.  The patient called the police reporting an altercation with a  former coworker (who is deceased) who would not leave his home.  On police arrival, there was no man in the home and it was actually his wife he was fighting with, therefore they called for EMS.  Patient was calm and cooperative in the emergency department and disturbed by the delusion he had previously.  He denies any history of psychiatric disorders, hallucinations, suicidal or homicidal ideation, substance abuse. He reports his twin brother died and he is having an excessively difficult time processing the loss.  He has no neurologic complaints or deficits.  Reports he physically well other than his chronic pain.   Initial CT head without acute intracranial abnormality.  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  Hospitalist was consulted for further evaluation of altered mental status.  5/21.  Patient able to recall a vivid argument with his former Radio broadcast assistant.  The patient himself stated he called the police to get the coworker out.  When I spoke with the patient's wife, she states that former coworker was not at the house. 5/22.  Hemoglobin drifted down to 7.4.  Patient okay with a unit of blood.  Patient still very concerned about that argument that he had with his former Radio broadcast assistant.  Patient also distressed about the nursing staff cleaning him up. 5/23.  Hemoglobin up to 8.8 after transfusion.  Continue IV Ancef  for cellulitis.  Set up for nocturnal oxygen with nocturnal hypoxia and overnight oximetry test. 5/24.  Patient willing to go home.  Right lower extremity still red but improved since when he came in.  Will change over antibiotics from IV Ancef  to oral doxycycline  and Augmentin  upon going home for broader coverage.

## 2023-12-28 NOTE — Assessment & Plan Note (Addendum)
 Patient on Coreg  and Imdur .  Discontinue Norvasc 

## 2023-12-28 NOTE — Assessment & Plan Note (Signed)
 Recent diagnosis.  On Protonix 

## 2023-12-28 NOTE — Assessment & Plan Note (Addendum)
 Hold Glucotrol .  Can go back on Farxiga as outpatient.

## 2023-12-28 NOTE — Progress Notes (Signed)
 Progress Note   Patient: Daniel Kline:[id number] DOB: 1946/06/28 DOA: 12/27/2023     0 DOS: the patient was seen and examined on 12/28/2023   Brief hospital course: 78 y.o. male with medical history significant of T2DM, CKD IV, HLD, chronic anemia brought in via EMS from home for evaluation after.  The patient called the police reporting an altercation with a  former coworker (who is deceased) who would not leave his home.  On police arrival, there was no man in the home and it was actually his wife he was fighting with, therefore they called for EMS.  Patient was calm and cooperative in the emergency department and disturbed by the delusion he had previously.  He denies any history of psychiatric disorders, hallucinations, suicidal or homicidal ideation, substance abuse. He reports his twin brother died and he is having an excessively difficult time processing the loss.  He has no neurologic complaints or deficits.  Reports he physically well other than his chronic pain.   Initial CT head without acute intracranial abnormality.  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  Hospitalist was consulted for further evaluation of altered mental status.  5/21.  Patient able to recall a vivid argument with his former Radio broadcast assistant.  The patient himself stated he called the police to get the coworker out.  When I spoke with the patient's wife, she states that former coworker was not at the house.  Assessment and Plan: * Hallucination Definite visual hallucination.  Likely also auditory hallucination with him arguing with this person that was not there.  Also with acute metabolic encephalopathy.  Patient has vivid recollection of the argument.  Will consult psychiatry.  May be infection playing a role.  Patient was out of his Tranxene  but does not take all the time.  Cellulitis of right lower extremity Start Ancef .  Uncontrolled type 2 diabetes mellitus with hypoglycemia, without  long-term current use of insulin  (HCC) Hold Glucotrol  and Farxiga for now.  Sliding scale insulin .  CKD (chronic kidney disease) stage 4, GFR 15-29 ml/min (HCC) Last creatinine 2.94 with a GFR of 21.  Essential hypertension Patient on Norvasc  and Coreg  and Imdur .  Anemia of chronic disease Hemoglobin 7.7 with a ferritin above 500.  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  On Protonix   Rheumatoid arthritis (HCC) Holding methotrexate   Class II obesity BMI 38.09        Subjective: Patient very disturbed about argument with his prior coworker yesterday.  Contributing Myrtie Atkinson recollection of the argument.  Patient's wife states that former coworker was not at the house that was only her.  Physical Exam: Vitals:   12/27/23 2030 12/27/23 2113 12/28/23 0401 12/28/23 0731  BP: (!) 159/93 (!) 140/80 (!) 117/55 (!) 133/59  Pulse: 83 68 (!) 51 (!) 55  Resp: 16 19 18 18   Temp: 98.1 F (36.7 C) 98.2 F (36.8 C) 98.3 F (36.8 C) 98.2 F (36.8 C)  TempSrc: Oral Oral    SpO2: 96% 99% 97% 96%  Weight:      Height:       Physical Exam HENT:     Head: Normocephalic.     Mouth/Throat:     Pharynx: No oropharyngeal exudate.  Eyes:     General: Lids are normal.     Conjunctiva/sclera: Conjunctivae normal.  Cardiovascular:     Rate and Rhythm: Normal rate and regular rhythm.     Heart sounds: Normal heart sounds, S1 normal  and S2 normal.  Pulmonary:     Breath sounds: No decreased breath sounds, wheezing, rhonchi or rales.  Abdominal:     Palpations: Abdomen is soft.     Tenderness: There is no abdominal tenderness.  Musculoskeletal:     Right lower leg: Swelling present.     Left lower leg: Swelling present.  Skin:    General: Skin is warm.     Comments: Skin tear left lower extremity without signs of infection. Right lower extremity warm to touch and erythematous.  Scabs seen on his right lower extremity.  Neurological:     Mental Status: He  is alert.     Comments: Patient able to hold a conversation and move his extremities.     Data Reviewed: Creatinine 2.94 White blood cell count 6.2, hemoglobin 7.7, platelet count 140 Family Communication: Spoke with wife on the phone spoke with patient's wife on the phonespoke with wife on the phone  Disposition: Status is: Observation Psychiatry consult for visual and auditory hallucination, start iv ancef  for cellulitis right lower extremity  Planned Discharge Destination: Home    Time spent: 28 minutes  Author: Verla Glaze, MD 12/28/2023 12:08 PM  For on call review [email or web address].

## 2023-12-28 NOTE — Assessment & Plan Note (Signed)
 BMI 38.09

## 2023-12-28 NOTE — Evaluation (Signed)
 Physical Therapy Evaluation Patient Details Name: Daniel Kline MRN: [id number] DOB: 12-Jul-1946 Today's Date: 12/28/2023  History of Present Illness  Pt admitted for encephalopathy with active hallucinations. History includes neuropathy, post polio, DM, CKD, and HLD.  Clinical Impression  Pt is a pleasant 78 year old male who was admitted for encephalopathy. Pt performs bed mobility with min assist, transfers with supervision, and ambulation with cga and PFRW. Pt demonstrates deficits with endurance/balance. Pt reports he is active with HHPT and is very motivated to participate. He is at baseline level. Would benefit from skilled PT to address above deficits and promote optimal return to PLOF. Pt will continue to receive skilled PT services while admitted and will defer to TOC/care team for updates regarding disposition planning.       If plan is discharge home, recommend the following: A little help with walking and/or transfers;A little help with bathing/dressing/bathroom;Assist for transportation;Help with stairs or ramp for entrance   Can travel by private vehicle   Yes    Equipment Recommendations None recommended by PT  Recommendations for Other Services       Functional Status Assessment Patient has had a recent decline in their functional status and demonstrates the ability to make significant improvements in function in a reasonable and predictable amount of time.     Precautions / Restrictions Precautions Precautions: Fall Recall of Precautions/Restrictions: Intact Restrictions Weight Bearing Restrictions Per Provider Order: No      Mobility  Bed Mobility Overal bed mobility: Needs Assistance Bed Mobility: Supine to Sit     Supine to sit: Min assist     General bed mobility comments: needs assist for trunkal elevation.    Transfers Overall transfer level: Needs assistance Equipment used: Rolling walker (2 wheels) Transfers: Sit to/from Stand Sit to  Stand: Supervision           General transfer comment: bed slightly elevated and able to stand with ease. Multiple reps for practice    Ambulation/Gait Ambulation/Gait assistance: Contact guard assist, Supervision Gait Distance (Feet): 200 Feet Assistive device: Rolling walker (2 wheels) Gait Pattern/deviations: Step-through pattern, Decreased step length - right, Decreased step length - left, Decreased stride length, Trunk flexed, Shuffle       General Gait Details: good cadence noted. RW used with platform attachment. No rest breaks needed.  Stairs            Wheelchair Mobility     Tilt Bed    Modified Rankin (Stroke Patients Only)       Balance Overall balance assessment: Needs assistance Sitting-balance support: Feet supported Sitting balance-Leahy Scale: Normal     Standing balance support: Bilateral upper extremity supported, During functional activity, Reliant on assistive device for balance Standing balance-Leahy Scale: Good                               Pertinent Vitals/Pain Pain Assessment Pain Assessment: 0-10 Pain Score: 6  Pain Location: B LEs, back, groin Pain Descriptors / Indicators: Aching, Discomfort, Dull, Grimacing Pain Intervention(s): Limited activity within patient's tolerance, Monitored during session, Repositioned    Home Living Family/patient expects to be discharged to:: Private residence Living Arrangements: Spouse/significant other Available Help at Discharge: Family;Available 24 hours/day Type of Home: House Home Access: Stairs to enter Entrance Stairs-Rails: Right;Left;Can reach both Entrance Stairs-Number of Steps: 4   Home Layout: One level Home Equipment: Cane - single Librarian, academic (2 wheels);BSC/3in1 Additional Comments: PFRW  Prior Function Prior Level of Function : Needs assist             Mobility Comments: reports he's been using the PFRW and reports 3 recent falls ADLs Comments:  wife assist with LB dressing. Sleeps in recliner     Extremity/Trunk Assessment   Upper Extremity Assessment Upper Extremity Assessment: Overall WFL for tasks assessed (R UE impaired, baseline)    Lower Extremity Assessment Lower Extremity Assessment: Overall WFL for tasks assessed       Communication   Communication Communication: No apparent difficulties    Cognition Arousal: Alert Behavior During Therapy: WFL for tasks assessed/performed                           PT - Cognition Comments: alert x 4 and agreeable to session Following commands: Intact       Cueing Cueing Techniques: Verbal cues, Gestural cues     General Comments      Exercises     Assessment/Plan    PT Assessment Patient needs continued PT services  PT Problem List Decreased strength;Decreased activity tolerance;Decreased balance;Decreased mobility;Decreased knowledge of use of DME       PT Treatment Interventions DME instruction;Gait training;Stair training;Functional mobility training;Therapeutic activities;Therapeutic exercise;Balance training;Patient/family education    PT Goals (Current goals can be found in the Care Plan section)  Acute Rehab PT Goals Patient Stated Goal: To get stronger PT Goal Formulation: With patient Time For Goal Achievement: 01/11/24 Potential to Achieve Goals: Good    Frequency Min 1X/week     Co-evaluation               AM-PAC PT "6 Clicks" Mobility  Outcome Measure Help needed turning from your back to your side while in a flat bed without using bedrails?: A Little Help needed moving from lying on your back to sitting on the side of a flat bed without using bedrails?: A Little Help needed moving to and from a bed to a chair (including a wheelchair)?: A Little Help needed standing up from a chair using your arms (e.g., wheelchair or bedside chair)?: A Little Help needed to walk in hospital room?: A Little Help needed climbing 3-5 steps  with a railing? : A Lot 6 Click Score: 17    End of Session Equipment Utilized During Treatment: Gait belt Activity Tolerance: Patient tolerated treatment well Patient left: in chair;with call bell/phone within reach;with chair alarm set Nurse Communication: Mobility status PT Visit Diagnosis: History of falling (Z91.81);Muscle weakness (generalized) (M62.81);Difficulty in walking, not elsewhere classified (R26.2)    Time: 2130-8657 PT Time Calculation (min) (ACUTE ONLY): 38 min   Charges:   PT Evaluation $PT Eval Low Complexity: 1 Low PT Treatments $Gait Training: 23-37 mins PT General Charges $$ ACUTE PT VISIT: 1 Visit         Amparo Balk, PT, DPT, GCS [phone]   Kelvis Berger 12/28/2023, 4:34 PM

## 2023-12-28 NOTE — Assessment & Plan Note (Addendum)
 Patient was prescribed Ancef .  Since cellulitis slow to improve but did show improvement will switch over to doxycycline  and Augmentin  upon going home.

## 2023-12-28 NOTE — Progress Notes (Signed)
 Patient placed on overnight pulse oximetry on room air. Saturation 100% at this time.

## 2023-12-29 ENCOUNTER — Ambulatory Visit: Admitting: Internal Medicine

## 2023-12-29 DIAGNOSIS — N184 Chronic kidney disease, stage 4 (severe): Secondary | ICD-10-CM | POA: Diagnosis not present

## 2023-12-29 DIAGNOSIS — F05 Delirium due to known physiological condition: Secondary | ICD-10-CM | POA: Diagnosis not present

## 2023-12-29 DIAGNOSIS — R443 Hallucinations, unspecified: Secondary | ICD-10-CM | POA: Diagnosis not present

## 2023-12-29 DIAGNOSIS — L03115 Cellulitis of right lower limb: Secondary | ICD-10-CM | POA: Diagnosis not present

## 2023-12-29 DIAGNOSIS — I5032 Chronic diastolic (congestive) heart failure: Secondary | ICD-10-CM | POA: Insufficient documentation

## 2023-12-29 DIAGNOSIS — E11649 Type 2 diabetes mellitus with hypoglycemia without coma: Secondary | ICD-10-CM | POA: Diagnosis not present

## 2023-12-29 LAB — BASIC METABOLIC PANEL WITH GFR
Anion gap: 7 (ref 5–15)
BUN: 37 mg/dL — ABNORMAL HIGH (ref 8–23)
CO2: 26 mmol/L (ref 22–32)
Calcium: 8.4 mg/dL — ABNORMAL LOW (ref 8.9–10.3)
Chloride: 104 mmol/L (ref 98–111)
Creatinine, Ser: 3.1 mg/dL — ABNORMAL HIGH (ref 0.61–1.24)
GFR, Estimated: 20 mL/min — ABNORMAL LOW (ref >60)
Glucose, Bld: 112 mg/dL — ABNORMAL HIGH (ref 70–99)
Potassium: 3.6 mmol/L (ref 3.5–5.1)
Sodium: 137 mmol/L (ref 135–145)

## 2023-12-29 LAB — CBC
HCT: 23.1 % — ABNORMAL LOW (ref 39.0–52.0)
Hemoglobin: 7.4 g/dL — ABNORMAL LOW (ref 13.0–17.0)
MCH: 31.8 pg (ref 26.0–34.0)
MCHC: 32 g/dL (ref 30.0–36.0)
MCV: 99.1 fL (ref 80.0–100.0)
Platelets: 141 10*3/uL — ABNORMAL LOW (ref 150–400)
RBC: 2.33 MIL/uL — ABNORMAL LOW (ref 4.22–5.81)
RDW: 17.1 % — ABNORMAL HIGH (ref 11.5–15.5)
WBC: 5.8 10*3/uL (ref 4.0–10.5)
nRBC: 0 % (ref 0.0–0.2)

## 2023-12-29 LAB — GLUCOSE, CAPILLARY
Glucose-Capillary: 85 mg/dL (ref 70–99)
Glucose-Capillary: 146 mg/dL — ABNORMAL HIGH (ref 70–99)
Glucose-Capillary: 110 mg/dL — ABNORMAL HIGH (ref 70–99)
Glucose-Capillary: 132 mg/dL — ABNORMAL HIGH (ref 70–99)

## 2023-12-29 LAB — HIV ANTIBODY (ROUTINE TESTING W REFLEX): HIV Screen 4th Generation wRfx: NONREACTIVE

## 2023-12-29 LAB — RPR: RPR Ser Ql: NONREACTIVE

## 2023-12-29 LAB — BRAIN NATRIURETIC PEPTIDE: B Natriuretic Peptide: 244.8 pg/mL — ABNORMAL HIGH (ref 0.0–100.0)

## 2023-12-29 LAB — PREPARE RBC (CROSSMATCH)

## 2023-12-29 LAB — TSH: TSH: 3.247 u[IU]/mL (ref 0.350–4.500)

## 2023-12-29 MED ORDER — QUETIAPINE FUMARATE 25 MG PO TABS
50.0000 mg | ORAL_TABLET | Freq: Every day | ORAL | Status: DC
  Administered 2023-12-29 – 2023-12-30 (×2): 50 mg via ORAL
  Filled 2023-12-29 (×2): qty 2

## 2023-12-29 MED ORDER — MELATONIN 5 MG PO TABS
5.0000 mg | ORAL_TABLET | Freq: Every evening | ORAL | Status: DC | PRN
  Administered 2023-12-30: 5 mg via ORAL
  Filled 2023-12-29: qty 1

## 2023-12-29 MED ORDER — SODIUM CHLORIDE 0.9% IV SOLUTION: Freq: Once | INTRAVENOUS | Status: AC

## 2023-12-29 MED ORDER — ACETAMINOPHEN 325 MG PO TABS
650.0000 mg | ORAL_TABLET | Freq: Once | ORAL | Status: AC
  Administered 2023-12-29: 650 mg via ORAL
  Filled 2023-12-29: qty 2

## 2023-12-29 NOTE — Plan of Care (Signed)

## 2023-12-29 NOTE — Plan of Care (Signed)
   Problem: Coping: Goal: Ability to adjust to condition or change in health will improve Outcome: Progressing

## 2023-12-29 NOTE — Assessment & Plan Note (Addendum)
 last EF 60%.  BNP elevated 244.8.  Continue daily Lasix .

## 2023-12-29 NOTE — Progress Notes (Signed)
 Progress Note   Patient: Daniel Kline HKV:[id number] DOB: February 02, 1946 DOA: 12/27/2023     1 DOS: the patient was seen and examined on 12/29/2023   Brief hospital course: 78 y.o. male with medical history significant of T2DM, CKD IV, HLD, chronic anemia brought in via EMS from home for evaluation after.  The patient called the police reporting an altercation with a  former coworker (who is deceased) who would not leave his home.  On police arrival, there was no man in the home and it was actually his wife he was fighting with, therefore they called for EMS.  Patient was calm and cooperative in the emergency department and disturbed by the delusion he had previously.  He denies any history of psychiatric disorders, hallucinations, suicidal or homicidal ideation, substance abuse. He reports his twin brother died and he is having an excessively difficult time processing the loss.  He has no neurologic complaints or deficits.  Reports he physically well other than his chronic pain.   Initial CT head without acute intracranial abnormality.  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  Hospitalist was consulted for further evaluation of altered mental status.  5/21.  Patient able to recall a vivid argument with his former Radio broadcast assistant.  The patient himself stated he called the police to get the coworker out.  When I spoke with the patient's wife, she states that former coworker was not at the house. 5/22.  Hemoglobin drifted down to 7.4.  Patient okay with a unit of blood.  Patient still very concerned about that argument that he had with his former Radio broadcast assistant.  Patient also distressed about the nursing staff cleaning him up.  Assessment and Plan: * Hallucination Definite visual hallucination.  Likely also auditory hallucination with him arguing with this person that was not there.  Also with acute metabolic encephalopathy.  Patient has vivid recollection of the argument.  Appreciate  psychiatric consultation.  Infection may be playing a role.  Patient was out of his Tranxene  but does not take all the time.  Suspect patient has an underlying dementia.  Cellulitis of right lower extremity Continue Ancef .  Uncontrolled type 2 diabetes mellitus with hypoglycemia, without long-term current use of insulin  (HCC) Hold Glucotrol  and Farxiga for now.  Sliding scale insulin .  CKD (chronic kidney disease) stage 4, GFR 15-29 ml/min (HCC)  Creatinine 3.10 with a GFR of 20   Essential hypertension Patient on Norvasc  and Coreg  and Imdur .  Anemia of chronic disease Hemoglobin 7.4 with a ferritin above 500. Transfuse 1 unit of packed red blood cells.    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  On Protonix   Rheumatoid arthritis (HCC) Holding methotrexate   Class II obesity BMI 38.09  Chronic diastolic CHF (congestive heart failure) (HCC) last EF 60%.  BNP elevated 244.8.  Increase Lasix  to twice daily dosing with transfusing 1 unit of blood today.        Subjective:  patient did not sleep very much last night.  He is very concerned about the nursing staff coming in and cleaning him up.  He just wants to go home.  He was agreeable to a unit of blood for hemoglobin of 7.4.  On IV antibiotics for cellulitis.  Admitted with acute hallucination.  Physical Exam: Vitals:   12/29/23 0848 12/29/23 1027 12/29/23 1031 12/29/23 1046  BP: (!) 123/55 121/61 121/61 104/67  Pulse: (!) 53 (!) 52 (!) 52 (!) 53  Resp: 16 16 16  16  Temp: 98.1 F (36.7 C) 97.9 F (36.6 C) 97.9 F (36.6 C) 98.4 F (36.9 C)  TempSrc: Oral Oral Oral Oral  SpO2: 100% 100%  100%  Weight:      Height:       Physical Exam HENT:     Head: Normocephalic.     Mouth/Throat:     Pharynx: No oropharyngeal exudate.  Eyes:     General: Lids are normal.     Conjunctiva/sclera: Conjunctivae normal.  Cardiovascular:     Rate and Rhythm: Normal rate and regular rhythm.     Heart  sounds: Normal heart sounds, S1 normal and S2 normal.  Pulmonary:     Breath sounds: No decreased breath sounds, wheezing, rhonchi or rales.  Abdominal:     Palpations: Abdomen is soft.     Tenderness: There is no abdominal tenderness.  Musculoskeletal:     Right lower leg: Swelling present.     Left lower leg: Swelling present.  Skin:    General: Skin is warm.     Comments: Skin tear left lower extremity without signs of infection. Right lower extremity warm to touch and erythematous.  Scabs seen on his right lower extremity.  Neurological:     Mental Status: He is alert.     Comments: Patient able to hold a conversation and move his extremities.     Data Reviewed: Creatinine 3.1 with a GFR of 20, white blood count 5.8, hemoglobin 7.4, platelet count 141  Family Communication: Spoke with wife on the phone  Disposition: Status is: Inpatient Transfuse 1 unit of packed red blood cells today with hemoglobin of 7.4.  Continue IV antibiotics for cellulitis right lower extremity.  Try to get sleeping with increasing Seroquel to 50 mg nightly.  Planned Discharge Destination: Home with Home Health    Time spent: 28 minutes  Author: Verla Glaze, MD 12/29/2023 1:43 PM  For on call review [email or web address].

## 2023-12-29 NOTE — TOC Progression Note (Addendum)
 Transition of Care [facility]) - Progression Note    Patient Details  Name: Daniel Kline MRN: [id number] Date of Birth: [birth year]  [facility] CM/SW Contact  Alexandra Ice, RN Phone Number: 12/29/2023, 12:51 PM  Clinical Narrative:     Met with patient at bedside, discussed discharge needs. He stated he is active with [facility]. TOC sent message to Shaun with [facility] to confirm if patient is active with [facility] for home health services, awaiting response.      12:55p - Received response from Shaun, patient is current for RN and PT services, will need to resume when patient is discharged.   Expected Discharge Plan and Services                                               Social Determinants of Health (SDOH) Interventions SDOH Screenings   Food Insecurity: Patient Unable To Answer (12/27/2023)  Housing: Patient Unable To Answer (12/27/2023)  Transportation Needs: Patient Unable To Answer (12/27/2023)  Utilities: Patient Unable To Answer (12/27/2023)  Depression (PHQ2-9): Low Risk  (12/15/2023)  Financial Resource Strain: Low Risk  (07/20/2017)  Social Connections: Patient Unable To Answer (12/27/2023)  Recent Concern: Social Connections - Moderately Isolated (11/26/2023)  Tobacco Use: Medium Risk (12/27/2023)    Readmission Risk Interventions    11/30/2023    2:01 PM 09/06/2022    3:47 PM 01/27/2022   12:27 PM  Readmission Risk Prevention Plan  Transportation Screening Complete Complete Complete  PCP or Specialist Appt within 3-5 Days   Complete  HRI or Home Care Consult   Complete  Social Work Consult for Recovery Care Planning/Counseling   Complete  Palliative Care Screening   Complete  Medication Review Oceanographer) Complete Complete Complete  PCP or Specialist appointment within 3-5 days of discharge Complete    HRI or Home Care Consult Complete    SW Recovery Care/Counseling Consult Complete Complete   Palliative Care Screening  Not Applicable Not Applicable   Skilled Nursing Facility Not Applicable

## 2023-12-29 NOTE — Consult Note (Signed)
 [facility] Consult Follow up  Patient Name: .Daniel Kline  MRN: [id number]  DOB: [birth year]  Consult Order details:  Orders (From admission, onward)     Start     Ordered   12/28/23 1050  IP CONSULT TO PSYCHIATRY       Comments: Dr Coleman Daughters  Ordering Provider: Verla Glaze, MD  Provider:  Aurelia Blotter, MD  Question Answer Comment  Location [facility]   Reason for Consult? hallucination, viaual and possible auditory.      12/28/23 1050             Mode of Visit: In person    Psychiatry Consult Evaluation  Service Date: Dec 29, 2023 LOS:  LOS: 1 day  Chief Complaint visual hallucinations/illusions  Primary Psychiatric Diagnoses  Delirium due to medical problems, infection 2.  Brief psychotic episode 3.    Assessment  Daniel Kline is a 78 y.o. male admitted: Medicallyfor 12/27/2023 11:12 AM with medical history significant of T2DM, CKD IV, HLD, chronic anemia brought in via EMS from home for evaluation after. The patient called the police reporting an altercation with a former coworker (who is deceased) who would not leave his home. On police arrival, there was no man in the home and it was actually his wife he was fighting with, therefore they called for EMS. Psychiatry is consulted to evaluate the hallucinations/depression.   No safety concerns at this time and patient is psychiatrically at baseline.  The possibility of delusions that happened probably due to any underlying delirium coming from the possibility of cellulitis or initial stages of dementia.  He did report being sleep deprived for many nights since he got the news of his brother passed.  12/29/23: Patient reports another episode where he got confused about night nurse leaving the hospital and something bad happened to her.  Patient denies any problems or delusions/delusions during the daytime.  Patient and wife were present in the room.  Provider discussed  about the possibility of on sitting dementia with sundowning related psychosis.  Agree with the plan to increase Seroquel to 50 mg nightly.  Will recommend to titrate Seroquel next dose to 75 mg and max dose of 100 mg nightly.  With Seroquel does not mitigate the symptoms will switch to Risperdal.    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  If patient continues to have episodes of hallucinations will recommend to titrated up to 75mg  nightly- max of 100mg .  Even with Seroquel 100 mg if symptoms continue will recommend to switch to Risperdal 1 mg nightly to be given probably around 7 PM.  ## Medical Decision Making Capacity: Not specifically addressed in this encounter  ## Further Work-up:  --  -- most recent EKG on 5/20 had QtC of 412    ## Disposition:-- There are no psychiatric contraindications to discharge at this time  ## Behavioral / Environmental: -Delirium  Precautions: Delirium Interventions for Nursing and Staff: - RN to open blinds every AM. - To Bedside: Glasses, hearing aide, and pt's own shoes. Make available to patients. when possible and encourage use. - Encourage po fluids when appropriate, keep fluids within reach. - OOB to chair with meals. - Passive ROM exercises to all extremities with AM & PM care. - RN to assess orientation to person, time and place QAM and PRN. - Recommend extended visitation  hours with familiar family/friends as feasible. - Staff to minimize disturbances at night. Turn off television when pt asleep or when not in use.    ## Safety and Observation Level:  - Based on my clinical evaluation, I estimate the patient to be at LOW risk of self harm in the current setting. - At this time, we recommend  routine. This decision is based on my review of the chart including patient's history and current presentation, interview of the patient, mental status examination, and consideration of suicide risk including evaluating suicidal ideation, plan, intent, suicidal or self-harm behaviors, risk factors, and protective factors. This judgment is based on our ability to directly address suicide risk, implement suicide prevention strategies, and develop a safety plan while the patient is in the clinical setting. Please contact our team if there is a concern that risk level has changed.  CSSR Risk Category:C-SSRS RISK CATEGORY: No Risk  Suicide Risk Assessment: Patient has following modifiable risk factors for suicide: lack of access to outpatient mental health resources, which we are addressing by providing outpatient appointments. Patient has following non-modifiable or demographic risk factors for suicide: male gender Patient has the following protective factors against suicide: Supportive family  Thank you for this consult request. Recommendations have been communicated to the primary team.  We will follow up as needed basis at this time.  Please reach out to us  with any other or new concerns through [employer].   Aurelia Blotter, MD       History of Present Illness  Daniel Kline is a 78 y.o. male admitted: Medicallyfor 12/27/2023 11:12 AM with medical history significant of T2DM, CKD IV, HLD, chronic anemia brought in via EMS from home for evaluation after. The patient called the police reporting an altercation with a former coworker (who is deceased) who would not leave his  home. On police arrival, there was no man in the home and it was actually his wife he was fighting with, therefore they called for EMS. Psychiatry is consulted to evaluate the hallucinations/depression.   12/29/23:Patient reports another episode where he got confused about night nurse leaving the hospital and something bad happened to her.  Patient denies any problems or delusions/delusions during the daytime.  Patient and wife were present in the room.  Provider discussed about the possibility of on sitting dementia with sundowning related psychosis.  Patient denies SI/HI/plan and denies auditory hallucinations.  Collateral information:  Wife was bedside and did acknowledge that patient has diagnosis of sleep apnea requiring CPAP machine.  Since he started sleeping in the recliner he has not been snoring.  Provider educated about sleep apnea and needing a sleep study to see without CPAP and sleeping in recliner if he is getting enough oxygen at night.  Discussed the possibility of dementia and both patient and wife did acknowledge that patient has been displaying behavioral changes towards the evening of the day which resemble sundowning.  No other safety concerns endorsed by the wife.    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 Hx: None reported Living Situation: With wife Spiritual Hx: Believes in God Access to weapons/lethal means: Denies  Substance History Alcohol: Denies  Tobacco: Denies Illicit drugs:  Denies Prescription drug abuse: Denies Rehab hx: Denies  Exam Findings  Physical Exam: Reviewed and agree with the physical exam findings conducted by the medical provider Vital Signs:  Temp:  [97.6 F (36.4 C)-98.4 F (36.9 C)] 97.6 F (36.4 C) (05/22 1637) Pulse Rate:  [49-70] 49 (05/22 1637) Resp:  [16-17] 17 (05/22 1637) BP: (104-142)/(55-68) 121/61 (05/22 1637) SpO2:  [95 %-100 %] 100 % (05/22 1637) Blood pressure 121/61, pulse (!) 49, temperature 97.6 F (36.4 C), resp. rate 17, height 5\' 6"  (1.676 m), weight 107 kg, SpO2 100%. Body mass index is 38.09 kg/m.    Mental Status Exam: General Appearance: Disheveled  Orientation:  Other:  to self, location,situation, president  Memory:  Immediate;   Fair Recent;   Fair Remote;   poor  Concentration:  Concentration: Fair and Attention Span: Poor  Recall:  Fair  Attention  Fair  Eye Contact:  Fair  Speech:  Garbled  Language:  Fair  Volume:  Normal  Mood: fine  Affect:  Congruent  Thought Process:  Coherent  Thought Content:  Logical  Suicidal Thoughts:  No  Homicidal Thoughts:  No  Judgement:  Fair  Insight:  Fair  Psychomotor Activity:  Decreased  Akathisia:  No  Fund of Knowledge:  Fair      Assets:  Desire for Improvement  Cognition:  Impaired,  Mild  ADL's:  Impaired  AIMS (if indicated):        Other History   These have been pulled in through the EMR, reviewed, and updated if appropriate.  Family History:  The patient's family history is not on file. He was adopted.  Medical History: Past Medical History:  Diagnosis Date   Anemia    H/O   Anxiety    Arthritis    Atrial flutter (HCC)    a. s/p post ablation in 04/2017   Chronic kidney disease    Complication of anesthesia    CTCL (cutaneous T-cell lymphoma) (HCC)    Diabetes mellitus without complication (HCC)    Diastolic dysfunction    a. 03/2022 Echo: EF 60-65%, no rwma, GrI DD, nl RV fxn.   Dysplastic nevus 12/19/2017   Right distal  lat. forearm near wrist. Severe atypia, close to peripheral margin.   Dysplastic nevus 06/21/2018   Upper back right paraspinal. Severe atypia, peripheral margin involved. Excised 07/11/2018, margins free.   Family history of adverse reaction to anesthesia    PT WAS ADOPTED   HLD (hyperlipidemia)    HTN (hypertension)    Hx of dysplastic nevus 2019   multiple sites   Hx of squamous cell carcinoma 01/18/2018   R mid lateral forearm   MRSA (methicillin resistant Staphylococcus aureus)    after back surgery   OSA (obstructive sleep apnea)    USES BIPAP   Polio    POLIOMYELITIS 01/12/2010   Right arm affected   PONV (postoperative nausea and  vomiting)    Squamous cell carcinoma of skin 12/19/2017   Right mid lat. forearm. SCCis, hypertrophic.    Surgical History: Past Surgical History:  Procedure Laterality Date   BACK SURGERY     LUMBAR   CARDIAC ELECTROPHYSIOLOGY STUDY AND ABLATION  2019   CATARACT EXTRACTION W/PHACO Right 05/05/2022   Procedure: CATARACT EXTRACTION PHACO AND INTRAOCULAR LENS PLACEMENT (IOC) RIGHT;  Surgeon: Annell Kidney, MD;  Location: [facility];  Service: Ophthalmology;  Laterality: Right;  Diabetic 10.42 01:13.4   COLONOSCOPY WITH PROPOFOL  N/A 04/04/2019   Procedure: COLONOSCOPY WITH PROPOFOL ;  Surgeon: Toledo, Alphonsus Jeans, MD;  Location: [facility] ENDOSCOPY;  Service: Gastroenterology;  Laterality: N/A;   ESOPHAGOGASTRODUODENOSCOPY N/A 11/28/2023   Procedure: EGD (ESOPHAGOGASTRODUODENOSCOPY);  Surgeon: Marnee Sink, MD;  Location: [facility] END[facility];  Service: Endoscopy;  Laterality: N/A;   I & D EXTREMITY Right 02/01/2020   Procedure: IRRIGATION AND DEBRIDEMENT EXTREMITY with poly exchange;  Surgeon: Arlyne Lame, MD;  Location: [facility] ORS;  Service: Orthopedics;  Laterality: Right;   INCISION AND DRAINAGE     BACK-MRSA INFECTION AFTER BACK SURGERY   KNEE ARTHROPLASTY Right 01/28/2020   Procedure: COMPUTER ASSISTED TOTAL KNEE ARTHROPLASTY;  Surgeon:  Arlyne Lame, MD;  Location: [facility] ORS;  Service: Orthopedics;  Laterality: Right;   MOUTH SURGERY     right elbow surgery     right knee surgery     right shoulder surgery     from polio damage   TONSILLECTOMY     TOTAL HIP ARTHROPLASTY Bilateral 04/2016     Medications:   Current Facility-Administered Medications:    acetaminophen  (TYLENOL ) tablet 650 mg, 650 mg, Oral, Q6H PRN **OR** acetaminophen  (TYLENOL ) suppository 650 mg, 650 mg, Rectal, Q6H PRN, Krugh, Marissa C, DO   amLODipine  (NORVASC ) tablet 5 mg, 5 mg, Oral, Daily, Krugh, Marissa C, DO, 5 mg at 12/29/23 4782   calcitRIOL (ROCALTROL) capsule 0.25 mcg, 0.25 mcg, Oral, Daily, Krugh, Marissa C, DO, 0.25 mcg at 12/29/23 9562   carvedilol  (COREG ) tablet 3.125 mg, 3.125 mg, Oral, BID WC, Krugh, Marissa C, DO, 3.125 mg at 12/29/23 1743   ceFAZolin  (ANCEF ) IVPB 2g/100 mL premix, 2 g, Intravenous, Q12H, Wieting, Richard, MD, Last Rate: 200 mL/hr at 12/29/23 1713, Infusion Verify at 12/29/23 1713   clorazepate  (TRANXENE ) tablet 7.5 mg, 7.5 mg, Oral, BID PRN, Krugh, Marissa C, DO   folic acid  (FOLVITE ) tablet 1 mg, 1 mg, Oral, Daily, Krugh, Marissa C, DO, 1 mg at 12/29/23 0850   furosemide  (LASIX ) tablet 40 mg, 40 mg, Oral, BID, Clelia Current, Richard, MD, 40 mg at 12/29/23 1743   gabapentin  (NEURONTIN ) capsule 300 mg, 300 mg, Oral, TID, Krugh, Marissa C, DO, 300 mg at 12/29/23 1743   heparin  injection 5,000 Units, 5,000 Units, Subcutaneous, Q8H, Krugh, Marissa C, DO, 5,000 Units at 12/29/23 1312   HYDROcodone -acetaminophen  (NORCO/VICODIN) 5-325 MG per tablet 1 tablet, 1 tablet, Oral, Q6H PRN, Krugh, Marissa C, DO, 1 tablet at 12/29/23 1745   hydrocortisone  cream 1 %, , Topical, TID PRN, Krugh, Marissa C, DO, 1 Application at 12/28/23 0321   insulin  aspart (novoLOG ) injection 0-15 Units, 0-15 Units, Subcutaneous, TID WC, Krugh, Marissa C, DO, 2 Units at 12/29/23 1311   insulin  aspart (novoLOG ) injection 0-5 Units, 0-5 Units, Subcutaneous,  QHS, Krugh, Marissa C, DO   isosorbide  mononitrate (IMDUR ) 24 hr tablet 60 mg, 60 mg, Oral, QAC lunch, Krugh, Marissa C, DO, 60 mg at 12/29/23 1302   mupirocin  cream (BACTROBAN ) 2 %, , Topical,  Daily, Verla Glaze, MD, Given at 12/29/23 1610   ondansetron  (ZOFRAN ) tablet 4 mg, 4 mg, Oral, Q6H PRN **OR** ondansetron  (ZOFRAN ) injection 4 mg, 4 mg, Intravenous, Q6H PRN, Krugh, Marissa C, DO   pantoprazole  (PROTONIX ) EC tablet 40 mg, 40 mg, Oral, BID, Krugh, Marissa C, DO, 40 mg at 12/29/23 0849   QUEtiapine (SEROQUEL) tablet 50 mg, 50 mg, Oral, QHS, Wieting, Richard, MD   rosuvastatin  (CRESTOR ) tablet 10 mg, 10 mg, Oral, Daily, Krugh, Marissa C, DO, 10 mg at 12/29/23 0849  Allergies: Allergies  Allergen Reactions   Bee Venom Anaphylaxis   Oxycodone  Other (See Comments)    Delusions   Hydromorphone  Other (See Comments)    hallucinating   Hydroxychloroquine     Other reaction(s): Other (See Comments) He broke out really badly.   Zolpidem Other (See Comments)    Cyndel Griffey, MD

## 2023-12-30 DIAGNOSIS — N179 Acute kidney failure, unspecified: Secondary | ICD-10-CM | POA: Diagnosis not present

## 2023-12-30 DIAGNOSIS — N189 Chronic kidney disease, unspecified: Secondary | ICD-10-CM

## 2023-12-30 DIAGNOSIS — G4734 Idiopathic sleep related nonobstructive alveolar hypoventilation: Secondary | ICD-10-CM | POA: Diagnosis not present

## 2023-12-30 DIAGNOSIS — R443 Hallucinations, unspecified: Secondary | ICD-10-CM | POA: Diagnosis not present

## 2023-12-30 DIAGNOSIS — L03115 Cellulitis of right lower limb: Secondary | ICD-10-CM | POA: Diagnosis not present

## 2023-12-30 LAB — BASIC METABOLIC PANEL WITH GFR
Anion gap: 7 (ref 5–15)
BUN: 36 mg/dL — ABNORMAL HIGH (ref 8–23)
CO2: 29 mmol/L (ref 22–32)
Calcium: 8.6 mg/dL — ABNORMAL LOW (ref 8.9–10.3)
Chloride: 103 mmol/L (ref 98–111)
Creatinine, Ser: 3.37 mg/dL — ABNORMAL HIGH (ref 0.61–1.24)
GFR, Estimated: 18 mL/min — ABNORMAL LOW (ref >60)
Glucose, Bld: 107 mg/dL — ABNORMAL HIGH (ref 70–99)
Potassium: 3.5 mmol/L (ref 3.5–5.1)
Sodium: 139 mmol/L (ref 135–145)

## 2023-12-30 LAB — CBC
HCT: 27.8 % — ABNORMAL LOW (ref 39.0–52.0)
Hemoglobin: 8.8 g/dL — ABNORMAL LOW (ref 13.0–17.0)
MCH: 31.1 pg (ref 26.0–34.0)
MCHC: 31.7 g/dL (ref 30.0–36.0)
MCV: 98.2 fL (ref 80.0–100.0)
Platelets: 144 10*3/uL — ABNORMAL LOW (ref 150–400)
RBC: 2.83 MIL/uL — ABNORMAL LOW (ref 4.22–5.81)
RDW: 18 % — ABNORMAL HIGH (ref 11.5–15.5)
WBC: 6.2 10*3/uL (ref 4.0–10.5)
nRBC: 0 % (ref 0.0–0.2)

## 2023-12-30 LAB — GLUCOSE, CAPILLARY
Glucose-Capillary: 88 mg/dL (ref 70–99)
Glucose-Capillary: 155 mg/dL — ABNORMAL HIGH (ref 70–99)
Glucose-Capillary: 93 mg/dL (ref 70–99)
Glucose-Capillary: 207 mg/dL — ABNORMAL HIGH (ref 70–99)

## 2023-12-30 LAB — TYPE AND SCREEN
ABO/RH(D): O POS
Antibody Screen: NEGATIVE
Unit division: 0

## 2023-12-30 LAB — BPAM RBC
Blood Product Expiration Date: 202506172359
ISSUE DATE / TIME: 202505221024
Unit Type and Rh: 5100

## 2023-12-30 MED ORDER — AMLODIPINE BESYLATE 5 MG PO TABS
2.5000 mg | ORAL_TABLET | Freq: Every day | ORAL | Status: DC
Start: 2023-12-30 — End: 2023-12-31
  Administered 2023-12-30: 2.5 mg via ORAL

## 2023-12-30 MED ORDER — GABAPENTIN 100 MG PO CAPS
200.0000 mg | ORAL_CAPSULE | Freq: Three times a day (TID) | ORAL | Status: DC
  Administered 2023-12-30 – 2023-12-31 (×2): 200 mg via ORAL
  Filled 2023-12-30 (×2): qty 2

## 2023-12-30 MED ORDER — FUROSEMIDE 40 MG PO TABS
40.0000 mg | ORAL_TABLET | Freq: Every day | ORAL | Status: DC
  Administered 2023-12-30: 40 mg via ORAL

## 2023-12-30 NOTE — Progress Notes (Signed)
 Physical Therapy Treatment Patient Details Name: Daniel Kline MRN: [id number] DOB: [birth year] Today's Date: 12/30/2023   History of Present Illness Pt admitted for encephalopathy with active hallucinations. History includes neuropathy, post polio, DM, CKD, and HLD.    PT Comments  Pt was long sitting in bed upon arrival. He is alert but only truly oriented x2. Does consistently follow commands. Pt required more assistance to exit bed today however pt usually sleeps in a lift chair at home. Session included pt standing from EOB to Rt PRW and ambulated 120 ft. Highly recommend 24/7 assistance at DC for safety, if plan remains to DC directly home from acute hospital.    If plan is discharge home, recommend the following: A little help with walking and/or transfers;A little help with bathing/dressing/bathroom;Assist for transportation;Help with stairs or ramp for entrance     Equipment Recommendations  None recommended by PT       Precautions / Restrictions Precautions Precautions: Fall Recall of Precautions/Restrictions: Intact Restrictions Weight Bearing Restrictions Per Provider Order: No     Mobility  Bed Mobility Overal bed mobility: Needs Assistance Bed Mobility: Supine to Sit  Supine to sit: Mod assist, HOB elevated, Used rails   Transfers Overall transfer level: Needs assistance Equipment used: Rolling walker (2 wheels) Transfers: Sit to/from Stand Sit to Stand: Contact guard assist, Min assist  Ambulation/Gait Ambulation/Gait assistance: Contact guard assist Gait Distance (Feet): 120 Feet Assistive device: Right platform walker Gait Pattern/deviations: Step-through pattern Gait velocity: decreased  General Gait Details: Pt was able to ambulate 120 ft with platform RW.   Balance Overall balance assessment: Needs assistance Sitting-balance support: Feet supported Sitting balance-Leahy Scale: Normal     Standing balance support: Bilateral upper extremity  supported, During functional activity, Reliant on assistive device for balance Standing balance-Leahy Scale: Good Standing balance comment: reliant on BUE support.       Communication Communication Communication: No apparent difficulties  Cognition Arousal: Alert Behavior During Therapy: WFL for tasks assessed/performed   PT - Cognitive impairments: History of cognitive impairments, Awareness, Problem solving, Safety/Judgement   Orientation impairments: Time, Situation    PT - Cognition Comments: Pt is alert but disoriented x 2 Following commands: Intact                Pertinent Vitals/Pain Pain Assessment Pain Assessment: 0-10 Pain Score: 4  Pain Location: B LEs, back, groin Pain Descriptors / Indicators: Aching, Discomfort, Dull, Grimacing Pain Intervention(s): Limited activity within patient's tolerance, Monitored during session, Premedicated before session, Repositioned     PT Goals (current goals can now be found in the care plan section) Acute Rehab PT Goals Patient Stated Goal: none stated Progress towards PT goals: Progressing toward goals    Frequency    Min 1X/week       AM-PAC PT "6 Clicks" Mobility   Outcome Measure  Help needed turning from your back to your side while in a flat bed without using bedrails?: A Little Help needed moving from lying on your back to sitting on the side of a flat bed without using bedrails?: A Little Help needed moving to and from a bed to a chair (including a wheelchair)?: A Little Help needed standing up from a chair using your arms (e.g., wheelchair or bedside chair)?: A Little Help needed to walk in hospital room?: A Little Help needed climbing 3-5 steps with a railing? : A Lot 6 Click Score: 17    End of Session   Activity Tolerance: Patient tolerated  treatment well Patient left: in chair;with call bell/phone within reach Nurse Communication: Mobility status PT Visit Diagnosis: History of falling  (Z91.81);Muscle weakness (generalized) (M62.81);Difficulty in walking, not elsewhere classified (R26.2)     Time: 1610-9604 PT Time Calculation (min) (ACUTE ONLY): 14 min  Charges:    $Therapeutic Activity: 8-22 mins PT General Charges $$ ACUTE PT VISIT: 1 Visit                     Chester Costa PTA 12/30/23, 11:32 AM

## 2023-12-30 NOTE — TOC Progression Note (Signed)
 Transition of Care [facility]) - Progression Note    Patient Details  Name: Daniel Kline MRN: [id number] Date of Birth: 1945-12-12  [facility] CM/SW Contact  Alexandra Ice, RN Phone Number: 12/30/2023, 2:48 PM  Clinical Narrative:     Patient needs nocturnal oxygen. Sent overnight oximetry study and orders to Mitch at [employer] for processing. Patient to potentially discharge tomorrow.   Expected Discharge Plan: Home w Home Health Services Barriers to Discharge: Continued Medical Work up  Expected Discharge Plan and Services       Living arrangements for the past 2 months: Single Family Home                   DME Agency: NA                   Social Determinants of Health (SDOH) Interventions SDOH Screenings   Food Insecurity: Patient Unable To Answer (12/27/2023)  Housing: Patient Unable To Answer (12/27/2023)  Transportation Needs: Patient Unable To Answer (12/27/2023)  Utilities: Patient Unable To Answer (12/27/2023)  Depression (PHQ2-9): Low Risk  (12/15/2023)  Financial Resource Strain: Low Risk  (07/20/2017)  Social Connections: Patient Unable To Answer (12/27/2023)  Recent Concern: Social Connections - Moderately Isolated (11/26/2023)  Tobacco Use: Medium Risk (12/27/2023)    Readmission Risk Interventions    11/30/2023    2:01 PM 09/06/2022    3:47 PM 01/27/2022   12:27 PM  Readmission Risk Prevention Plan  Transportation Screening Complete Complete Complete  PCP or Specialist Appt within 3-5 Days   Complete  HRI or Home Care Consult   Complete  Social Work Consult for Recovery Care Planning/Counseling   Complete  Palliative Care Screening   Complete  Medication Review [employer]) Complete Complete Complete  PCP or Specialist appointment within 3-5 days of discharge Complete    HRI or Home Care Consult Complete    SW Recovery Care/Counseling Consult Complete Complete   Palliative Care Screening Not Applicable Not Applicable   Skilled  Nursing Facility Not Applicable

## 2023-12-30 NOTE — Plan of Care (Signed)
  Problem: Coping: Goal: Ability to adjust to condition or change in health will improve Outcome: Progressing   Problem: Clinical Measurements: Goal: Diagnostic test results will improve Outcome: Progressing   Problem: Activity: Goal: Risk for activity intolerance will decrease Outcome: Progressing   Problem: Coping: Goal: Level of anxiety will decrease Outcome: Progressing

## 2023-12-30 NOTE — Assessment & Plan Note (Addendum)
 Acute kidney injury on CKD stage IV.  Creatinine went up to 3.37 and was 2.89 on presentation.  Will cut back on Lasix  dose to daily dosing.

## 2023-12-30 NOTE — Progress Notes (Signed)
 Progress Note   Patient: Daniel Kline ZOX:[id number] DOB: 03/19/1946 DOA: 12/27/2023     2 DOS: the patient was seen and examined on 12/30/2023   Brief hospital course: 78 y.o. male with medical history significant of T2DM, CKD IV, HLD, chronic anemia brought in via EMS from home for evaluation after.  The patient called the police reporting an altercation with a  former coworker (who is deceased) who would not leave his home.  On police arrival, there was no man in the home and it was actually his wife he was fighting with, therefore they called for EMS.  Patient was calm and cooperative in the emergency department and disturbed by the delusion he had previously.  He denies any history of psychiatric disorders, hallucinations, suicidal or homicidal ideation, substance abuse. He reports his twin brother died and he is having an excessively difficult time processing the loss.  He has no neurologic complaints or deficits.  Reports he physically well other than his chronic pain.   Initial CT head without acute intracranial abnormality.  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  Hospitalist was consulted for further evaluation of altered mental status.  5/21.  Patient able to recall a vivid argument with his former Radio broadcast assistant.  The patient himself stated he called the police to get the coworker out.  When I spoke with the patient's wife, she states that former coworker was not at the house. 5/22.  Hemoglobin drifted down to 7.4.  Patient okay with a unit of blood.  Patient still very concerned about that argument that he had with his former Radio broadcast assistant.  Patient also distressed about the nursing staff cleaning him up.  Assessment and Plan: * Hallucination Definite visual hallucination.  Likely also auditory hallucination with him arguing with a person that was not there.  Also with acute metabolic encephalopathy.  Appreciate psychiatric consultation.  Infection may be playing a role.   Patient was out of his Tranxene  but does not take all the time.  Suspect patient has an underlying dementia.  Patient had a good night with Seroquel 50 mg nightly.  Cellulitis of right lower extremity Continue Ancef .  Nocturnal hypoxia Overnight oximetry service the patient qualifies for nocturnal oxygen.  Numerous episodes of desaturation below 88%.  Acute kidney injury superimposed on CKD (HCC) Acute kidney injury on CKD stage IV.  Creatinine went up to 3.37 and was 2.89 on presentation.  Will cut back on Lasix  dose to daily dosing.  Uncontrolled type 2 diabetes mellitus with hypoglycemia, without long-term current use of insulin  (HCC) Hold Glucotrol  and Farxiga for now.  Sliding scale insulin .  Essential hypertension Patient on Norvasc  and Coreg  and Imdur .  Anemia of chronic disease Hemoglobin up to 8.8 after transfusion on 5/22.  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  On Protonix   Rheumatoid arthritis (HCC) Holding methotrexate   Class II obesity BMI 38.09  Chronic diastolic CHF (congestive heart failure) (HCC) last EF 60%.  BNP elevated 244.8.  Cut back on Lasix  dosing to daily.        Subjective: Patient states he feels better.  Patient slept last night.  Admitted with hallucinations.  Physical Exam: Vitals:   12/30/23 0337 12/30/23 0344 12/30/23 0734 12/30/23 0920  BP: (!) 159/85 (!) 195/82 104/79 122/61  Pulse: (!) 49 61 (!) 52   Resp: 18 20 16    Temp: 98.2 F (36.8 C) 98.5 F (36.9 C) 98.2 F (36.8 C)   TempSrc:  Oral   SpO2: 97% 100% 95%   Weight:      Height:       Physical Exam HENT:     Head: Normocephalic.     Mouth/Throat:     Pharynx: No oropharyngeal exudate.  Eyes:     General: Lids are normal.     Conjunctiva/sclera: Conjunctivae normal.  Cardiovascular:     Rate and Rhythm: Normal rate and regular rhythm.     Heart sounds: Normal heart sounds, S1 normal and S2 normal.  Pulmonary:     Breath sounds:  No decreased breath sounds, wheezing, rhonchi or rales.  Abdominal:     Palpations: Abdomen is soft.     Tenderness: There is no abdominal tenderness.  Musculoskeletal:     Right lower leg: Swelling present.     Left lower leg: Swelling present.  Skin:    General: Skin is warm.     Comments: Skin tear left lower extremity without signs of infection. Right lower extremity warm to touch and erythematous.  Scabs seen on his right lower extremity.  Neurological:     Mental Status: He is alert.     Comments: Patient able to hold a conversation and move his extremities.     Data Reviewed: Creatinine 3.37, BUN 36, potassium 3.5, white blood cell count 6.2, hemoglobin 8.8, platelet count 144  Family Communication: Left message for patient's wife  Disposition: Status is: Inpatient Remains inpatient appropriate because: Continue another day of IV Ancef .  Planned Discharge Destination: Home with Home Health    Time spent: 28 minutes  Author: Verla Glaze, MD 12/30/2023 12:28 PM  For on call review [email or web address].

## 2023-12-30 NOTE — Assessment & Plan Note (Addendum)
 Overnight oximetry service the patient qualifies for nocturnal oxygen.  Numerous episodes of desaturation below 88%.  Home oxygen of 2 L set up upon discharge.

## 2023-12-30 NOTE — Plan of Care (Signed)

## 2023-12-30 NOTE — Progress Notes (Addendum)
   12/30/23 1415  Spiritual Encounters  Type of Visit Initial  Care provided to: Pt and family  Conversation partners present during encounter Nurse  Reason for visit Advance directives  OnCall Visit Yes   Chaplain visited with patient and wife because of [place] Consult in the system for an AD.  Chaplain explained the AD and also explained DNR and Full Code so that the patient and his wife would know that these are not related to the AD.  Chaplain was able to answer their concerns about that.  Chaplain checked with Nurse to confirm whether patient was Full Code or DNR and shared with the patient and said they can speak to the doctor regarding any changes they may want to that.  Chaplain also provided an additional AD packet at the wife's request.  Chaplain shared the times and days when notaries would be accessible and offered they could also do this outside of the hospital.  Wife shared patient is likely going to be discharged tomorrow.    Rev. Rana M. Nolon Baxter, M.Div. Chaplain Resident [facility]

## 2023-12-31 DIAGNOSIS — I10 Essential (primary) hypertension: Secondary | ICD-10-CM | POA: Diagnosis not present

## 2023-12-31 DIAGNOSIS — L03115 Cellulitis of right lower limb: Secondary | ICD-10-CM | POA: Diagnosis not present

## 2023-12-31 DIAGNOSIS — N179 Acute kidney failure, unspecified: Secondary | ICD-10-CM | POA: Diagnosis not present

## 2023-12-31 DIAGNOSIS — R443 Hallucinations, unspecified: Secondary | ICD-10-CM | POA: Diagnosis not present

## 2023-12-31 LAB — GLUCOSE, CAPILLARY
Glucose-Capillary: 95 mg/dL (ref 70–99)
Glucose-Capillary: 122 mg/dL — ABNORMAL HIGH (ref 70–99)

## 2023-12-31 LAB — BASIC METABOLIC PANEL WITH GFR
Anion gap: 10 (ref 5–15)
BUN: 35 mg/dL — ABNORMAL HIGH (ref 8–23)
CO2: 28 mmol/L (ref 22–32)
Calcium: 8.6 mg/dL — ABNORMAL LOW (ref 8.9–10.3)
Chloride: 99 mmol/L (ref 98–111)
Creatinine, Ser: 3.34 mg/dL — ABNORMAL HIGH (ref 0.61–1.24)
GFR, Estimated: 18 mL/min — ABNORMAL LOW (ref >60)
Glucose, Bld: 91 mg/dL (ref 70–99)
Potassium: 3.5 mmol/L (ref 3.5–5.1)
Sodium: 137 mmol/L (ref 135–145)

## 2023-12-31 LAB — HEMOGLOBIN: Hemoglobin: 8.8 g/dL — ABNORMAL LOW (ref 13.0–17.0)

## 2023-12-31 MED ORDER — AMOXICILLIN-POT CLAVULANATE 500-125 MG PO TABS: 1.0000 | ORAL_TABLET | Freq: Two times a day (BID) | ORAL | 0 refills | Status: AC

## 2023-12-31 MED ORDER — MELATONIN 5 MG PO TABS: 5.0000 mg | ORAL_TABLET | Freq: Every evening | ORAL | 0 refills | Status: DC | PRN

## 2023-12-31 MED ORDER — QUETIAPINE FUMARATE 50 MG PO TABS: 50.0000 mg | ORAL_TABLET | Freq: Every day | ORAL | 0 refills | Status: DC

## 2023-12-31 MED ORDER — FUROSEMIDE 40 MG PO TABS: 40.0000 mg | ORAL_TABLET | Freq: Every day | ORAL | Status: DC

## 2023-12-31 MED ORDER — GABAPENTIN 100 MG PO CAPS: 200.0000 mg | ORAL_CAPSULE | Freq: Three times a day (TID) | ORAL | 0 refills | Status: DC

## 2023-12-31 MED ORDER — DOXYCYCLINE HYCLATE 100 MG PO CAPS: 100.0000 mg | ORAL_CAPSULE | Freq: Two times a day (BID) | ORAL | 0 refills | Status: AC

## 2023-12-31 NOTE — Progress Notes (Signed)
 Patient given verbal and written discharge orders. He acknowledges and understands and states he will comply. Patient is waiting for ride to come.

## 2023-12-31 NOTE — Discharge Summary (Signed)
 Physician Discharge Summary   Patient: Daniel Kline MRN: [id number] DOB: [birth year]  Admit date:     12/27/2023  Discharge date: 12/31/23  Discharge Physician: Verla Glaze   PCP: Helaine Llanos, MD   Recommendations at discharge:   Follow-up PCP 5 days  Discharge Diagnoses: Principal Problem:   Hallucination Active Problems:   Cellulitis of right lower extremity   Nocturnal hypoxia   Acute kidney injury superimposed on CKD (HCC)   Essential hypertension   Uncontrolled type 2 diabetes mellitus with hypoglycemia, without long-term current use of insulin  (HCC)   Anemia of chronic disease   Chronic gastric ulcer without bleeding or perforation   Rheumatoid arthritis (HCC)   Class II obesity   Hallucinations   Chronic diastolic CHF (congestive heart failure) [facility])    Hospital Course: 78 y.o. male with medical history significant of T2DM, CKD IV, HLD, chronic anemia brought in via EMS from home for evaluation after.  The patient called the police reporting an altercation with a  former coworker (who is deceased) who would not leave his home.  On police arrival, there was no man in the home and it was actually his wife he was fighting with, therefore they called for EMS.  Patient was calm and cooperative in the emergency department and disturbed by the delusion he had previously.  He denies any history of psychiatric disorders, hallucinations, suicidal or homicidal ideation, substance abuse. He reports his twin brother died and he is having an excessively difficult time processing the loss.  He has no neurologic complaints or deficits.  Reports he physically well other than his chronic pain.   Initial CT head without acute intracranial abnormality.  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  Hospitalist was consulted for further evaluation of altered mental status.  5/21.  Patient able to recall a vivid argument with his former Radio broadcast assistant.  The patient  himself stated he called the police to get the coworker out.  When I spoke with the patient's wife, she states that former coworker was not at the house. 5/22.  Hemoglobin drifted down to 7.4.  Patient okay with a unit of blood.  Patient still very concerned about that argument that he had with his former Radio broadcast assistant.  Patient also distressed about the nursing staff cleaning him up. 5/23.  Hemoglobin up to 8.8 after transfusion.  Continue IV Ancef  for cellulitis.  Set up for nocturnal oxygen with nocturnal hypoxia and overnight oximetry test. 5/24.  Patient willing to go home.  Right lower extremity still red but improved since when he came in.  Will change over antibiotics from IV Ancef  to oral doxycycline  and Augmentin  upon going home for broader coverage.  Assessment and Plan: * Hallucination Definite visual hallucination.  Likely also auditory hallucination with him arguing with a person that was not there.  Also with acute metabolic encephalopathy.  Appreciate psychiatric consultation.  Infection may be playing a role.  Patient was out of his Tranxene  but does not take all the time.  Suspect patient has an underlying dementia.  Patient had a good night with Seroquel 50 mg nightly.  Continue Seroquel 50 mg nightly upon discharge.  Cellulitis of right lower extremity Patient was prescribed Ancef .  Since cellulitis slow to improve but did show improvement will switch over to doxycycline  and Augmentin  upon going home.  Nocturnal hypoxia Overnight oximetry service the patient qualifies for nocturnal oxygen.  Numerous episodes of desaturation below 88%.  Home oxygen of  2 L set up upon discharge.  Acute kidney injury superimposed on CKD (HCC) Acute kidney injury on CKD stage IV.  Creatinine went up to 3.37 and was 2.89 on presentation.  Will cut back on Lasix  dose to daily dosing.  Uncontrolled type 2 diabetes mellitus with hypoglycemia, without long-term current use of insulin  (HCC) Hold Glucotrol .   Can go back on Farxiga as outpatient.  Essential hypertension Patient on Coreg  and Imdur .  Discontinue Norvasc   Anemia of chronic disease Hemoglobin up to 8.8 after transfusion on 5/22.  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  On Protonix   Rheumatoid arthritis (HCC) Holding methotrexate   Class II obesity BMI 38.09  Chronic diastolic CHF (congestive heart failure) (HCC) last EF 60%.  BNP elevated 244.8.  Continue daily Lasix .         Consultants: None Procedures performed: None Disposition: Home health Diet recommendation:  Cardiac and Carb modified diet DISCHARGE MEDICATION: Allergies as of 12/31/2023       Reactions   Bee Venom Anaphylaxis   Oxycodone  Other (See Comments)   Delusions   Hydromorphone  Other (See Comments)   hallucinating   Hydroxychloroquine    Other reaction(s): Other (See Comments) He broke out really badly.   Zolpidem Other (See Comments)        Medication List     STOP taking these medications    amLODipine  5 MG tablet Commonly known as: NORVASC    Farxiga 5 MG Tabs tablet Generic drug: dapagliflozin propanediol   glipiZIDE  5 MG 24 hr tablet Commonly known as: GLUCOTROL  XL   hydrOXYzine  25 MG tablet Commonly known as: ATARAX    losartan  100 MG tablet Commonly known as: COZAAR    spironolactone  25 MG tablet Commonly known as: ALDACTONE        TAKE these medications    amoxicillin -clavulanate 500-125 MG tablet Commonly known as: Augmentin  Take 1 tablet by mouth 2 (two) times daily for 7 days.   calcitRIOL 0.25 MCG capsule Commonly known as: ROCALTROL Take 0.25 mcg by mouth daily.   carvedilol  3.125 MG tablet Commonly known as: COREG  TAKE 1 TABLET BY MOUTH 2 TIMES DAILY WITH A MEAL.   clorazepate  7.5 MG tablet Commonly known as: TRANXENE  TAKE 1 TABLET BY MOUTH TWICE A DAY AS NEEDED FOR ANXIETY   cyanocobalamin 500 MCG tablet Commonly known as: VITAMIN B12 Take 1,000 mcg by mouth  daily.   doxycycline  100 MG capsule Commonly known as: VIBRAMYCIN  Take 1 capsule (100 mg total) by mouth 2 (two) times daily for 7 days.   EPINEPHrine  0.3 mg/0.3 mL Soaj injection Commonly known as: EPI-PEN Inject 0.3 mg into the muscle as needed for anaphylaxis.   folic acid  1 MG tablet Commonly known as: FOLVITE  Take 1 mg by mouth daily.   furosemide  40 MG tablet Commonly known as: LASIX  Take 40 mg by mouth daily.   gabapentin  100 MG capsule Commonly known as: NEURONTIN  Take 2 capsules (200 mg total) by mouth 3 (three) times daily. What changed:  medication strength how much to take   HYDROcodone -acetaminophen  5-325 MG tablet Commonly known as: NORCO/VICODIN Take 1 tablet by mouth every 6 (six) hours as needed. for pain   hydrocortisone  2.5 % cream Apply topically 3 (three) times daily as needed.   IRON PO Take 1 tablet by mouth at bedtime.   isosorbide  mononitrate 60 MG 24 hr tablet Commonly known as: IMDUR  Take 1 tablet (60 mg total) by mouth daily before lunch.   latanoprost  0.005 % ophthalmic solution Commonly  known as: XALATAN  Place 1 drop into both eyes at bedtime.   MAGNESIUM  PO Take 1 tablet by mouth daily.   melatonin 5 MG Tabs Take 1 tablet (5 mg total) by mouth at bedtime as needed.   OneTouch Verio test strip Generic drug: glucose blood Use to check blood sugar once daily   pantoprazole  40 MG tablet Commonly known as: Protonix  Take 1 tablet (40 mg total) by mouth 2 (two) times daily.   QUEtiapine 50 MG tablet Commonly known as: SEROQUEL Take 1 tablet (50 mg total) by mouth at bedtime.   rosuvastatin  10 MG tablet Commonly known as: Crestor  Take 1 tablet (10 mg total) by mouth daily.   Vitamin D -3 25 MCG (1000 UT) Caps Take 1,000 Units by mouth daily.               Durable Medical Equipment  (From admission, onward)           Start     Ordered   12/30/23 0930  For home use only DME oxygen  Once       Question Answer  Comment  Length of Need Lifetime   Mode or (Route) Nasal cannula   Liters per Minute 2   Frequency Only at night (stationary unit needed)   Oxygen conserving device Yes   Oxygen delivery system Gas      12/30/23 0929            Follow-up Information     Curt Dover I, MD Follow up in 5 day(s).   Specialties: Internal Medicine, Pediatrics Contact information: [address] [place] [address] [phone]                Discharge Exam: Cleavon Curls Weights   12/27/23 1122  Weight: 107 kg   Physical Exam HENT:     Head: Normocephalic.     Mouth/Throat:     Pharynx: No oropharyngeal exudate.  Eyes:     General: Lids are normal.     Conjunctiva/sclera: Conjunctivae normal.  Cardiovascular:     Rate and Rhythm: Normal rate and regular rhythm.     Heart sounds: Normal heart sounds, S1 normal and S2 normal.  Pulmonary:     Breath sounds: No decreased breath sounds, wheezing, rhonchi or rales.  Abdominal:     Palpations: Abdomen is soft.     Tenderness: There is no abdominal tenderness.  Musculoskeletal:     Right lower leg: Swelling present.     Left lower leg: Swelling present.  Skin:    General: Skin is warm.     Comments: Skin tear left lower extremity without signs of infection. Right lower extremity still has some redness but the warmth has subsided.  Scabs seen on his right lower extremity.  Neurological:     Mental Status: He is alert.     Comments: Patient able to hold a conversation and move his extremities.      Condition at discharge: stable  The results of significant diagnostics from this hospitalization (including imaging, microbiology, ancillary and laboratory) are listed below for reference.   Imaging Studies: CT HEAD WO CONTRAST ( ) Result Date: 12/27/2023 CLINICAL DATA:  Altered mental status EXAM: CT HEAD WITHOUT CONTRAST TECHNIQUE: Contiguous axial images were obtained from the base of the skull through the vertex without  intravenous contrast. RADIATION DOSE REDUCTION: This exam was performed according to the departmental dose-optimization program which includes automated exposure control, adjustment of the mA and/or kV according to patient  size and/or use of iterative reconstruction technique. COMPARISON:  CT head 11/25/2023. FINDINGS: Brain: No acute intracranial hemorrhage. No CT evidence of acute infarct. Similar appearance of remote infarct in the right occipital lobe. Nonspecific hypoattenuation in the periventricular and subcortical white matter favored to reflect chronic microvascular ischemic changes. No edema, mass effect, or midline shift. The basilar cisterns are patent. Ventricles: The ventricles are normal. Vascular: Atherosclerotic calcifications of the carotid siphons and intracranial vertebral arteries. No hyperdense vessel. Skull: No acute or aggressive finding. Orbits: Right lens replacement.  Orbits otherwise unremarkable. Sinuses: Mucosal thickening throughout the paranasal sinuses with near complete opacification of the visualized sinuses, increased from prior. Other: Trace fluid in the left mastoid tip. IMPRESSION: No CT evidence of acute intracranial abnormality. 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 Mild chronic microvascular ischemic changes. Paranasal sinus disease as above, increased from prior. Electronically Signed   By: Denny Flack M.D.   On: 12/27/2023 15:19   DG Chest Portable 1 View Result Date: 12/27/2023 CLINICAL DATA:  Altered mental status EXAM: PORTABLE CHEST 1 VIEW COMPARISON:  10/26/2023 FINDINGS: Stable enlarged cardiac silhouette. Mildly prominent vasculature. Clear lungs with normal vascularity. Mild-to-moderate lower thoracic spine degenerative changes. Marked left glenohumeral degenerative changes and proximal right humerus postoperative changes with a fixation screw. IMPRESSION: Stable cardiomegaly with interval mild pulmonary vascular congestion. Electronically Signed   By:  Catherin Closs M.D.   On: 12/27/2023 15:16   US  RENAL Result Date: 12/09/2023 CLINICAL DATA:  Acute renal failure. EXAM: RENAL / URINARY TRACT ULTRASOUND COMPLETE COMPARISON:  March 17, 2016. FINDINGS: Right Kidney: Renal measurements: 9.9 x 5.6 x 4.6 cm = volume: 134 mL. Increased echogenicity of renal parenchyma is noted. 3.6 cm cyst is noted. No mass or hydronephrosis visualized. Left Kidney: Renal measurements: 8.2 x 5.2 x 4.2 cm = volume: 94 mL. Increased echogenicity of renal parenchyma is noted. 2.2 cm simple cyst is noted. No mass or hydronephrosis visualized. Bladder: Appears normal for degree of bladder distention. Other: None. IMPRESSION: Increased echogenicity of renal parenchyma is noted suggesting medical renal disease. No hydronephrosis or renal obstruction is noted. Electronically Signed   By: Rosalene Colon M.D.   On: 12/09/2023 16:00    Microbiology: Results for orders placed or performed during the hospital encounter of 12/27/23  Aerobic/Anaerobic Culture w Gram Stain (surgical/deep wound)     Status: None (Preliminary result)   Collection Time: 12/27/23 11:30 AM   Specimen: Wound  Result Value Ref Range Status   Specimen Description   Final    WOUND Performed at [facility], [address] [place], [place] [address]    Special Requests   Final    LL Performed at [facility], [address] [place], [place] [address]    Gram Stain   Final    NO WBC SEEN ABUNDANT GRAM POSITIVE RODS FEW GRAM POSITIVE COCCI Performed at [facility], [address] [place], [place] [address]    Culture   Final    MODERATE STAPHYLOCOCCUS AUREUS CONFIRMATION OF SUSCEPTIBILITIES IN PROGRESS ABUNDANT CORYNEBACTERIUM STRIATUM Standardized susceptibility testing for this organism is not available. NO ANAEROBES ISOLATED; CULTURE IN PROGRESS FOR 5 DAYS    Report Status PENDING  Incomplete  C Difficile Quick Screen w PCR reflex     Status: None   Collection Time:  12/28/23 12:03 PM   Specimen: STOOL  Result Value Ref Range Status   C Diff antigen NEGATIVE NEGATIVE Final   C Diff  toxin NEGATIVE NEGATIVE Final   C Diff interpretation No C. difficile detected.  Final    Comment: Performed at [facility], [address] [place], [place] [address]    Labs: CBC: Recent Labs  Lab 12/27/23 1127 12/28/23 0356 12/29/23 0504 12/30/23 0217 12/31/23 0741  WBC 7.7 6.2 5.8 6.2  --   HGB 8.3* 7.7* 7.4* 8.8* 8.8*  HCT 26.0* 24.3* 23.1* 27.8*  --   MCV 100.4* 100.0 99.1 98.2  --   PLT 168 146* 141* 144*  --    Basic Metabolic Panel: Recent Labs  Lab 12/27/23 1127 12/28/23 0356 12/29/23 0504 12/30/23 0217 12/31/23 0741  NA 138 139 137 139 137  K 3.7 3.6 3.6 3.5 3.5  CL 103 106 104 103 99  CO2 26 26 26 29 28   GLUCOSE 123* 115* 112* 107* 91  BUN 35* 36* 37* 36* 35*  CREATININE 2.89* 2.94* 3.10* 3.37* 3.34*  CALCIUM  8.4* 8.7* 8.4* 8.6* 8.6*   Liver Function Tests: Recent Labs  Lab 12/27/23 1127  AST 10*  ALT 10  ALKPHOS 59  BILITOT 0.5  PROT 6.6  ALBUMIN 3.0*   CBG: Recent Labs  Lab 12/30/23 1159 12/30/23 1637 12/30/23 2102 12/31/23 0752 12/31/23 1136  GLUCAP 155* 93 207* 95 122*    Discharge time spent: greater than 30 minutes.  Signed: Verla Glaze, MD [facility] 12/31/2023

## 2023-12-31 NOTE — Discharge Instructions (Signed)
 [facility] They will call you to set up when they are coming out to see you   [address], [place], [place] [address] Hours:  Open ? Closes 5?PM Phone: [phone]        Instructions after Total Knee Replacement   Reinaldo Berber M.D.     Dept. of Orthopaedics & Sports Medicine  [facility]  [address]  [place], [place]  [address]  Phone: [phone]   Fax: [phone]    DIET: Drink plenty of non-alcoholic fluids. Resume your normal diet. Include foods high in fiber.  ACTIVITY:  You may use crutches or a walker with weight-bearing as tolerated, unless instructed otherwise. You may be weaned off of the walker or crutches by your Physical Therapist.  Do NOT place pillows under the knee. Anything placed under the knee could limit your ability to straighten the knee.   Continue doing gentle exercises. Exercising will reduce the pain and swelling, increase motion, and prevent muscle weakness.   Please continue to use the TED compression stockings for 2 weeks. You may remove the stockings at night, but should reapply them in the morning. Do not drive or operate any equipment until instructed.  WOUND CARE:  Continue to use the PolarCare or ice packs periodically to reduce pain and swelling. You may begin showering 3 days after surgery with honeycomb dressing. Remove honeycomb dressing 7 days after surgery and continue showering. Allow dermabond to fall off on its own.  MEDICATIONS: You may resume your regular medications. Please take the pain medication as prescribed on the medication. Do not take pain medication on an empty stomach. You have been given a prescription for a blood thinner (Lovenox or Coumadin). Please take the medication as instructed. (NOTE: After completing a 2 week course of Lovenox, take one 81 mg Enteric-coated aspirin twice a day for 3 additional weeks. This along with elevation will help reduce the possibility of phlebitis in your operated  leg.) Do not drive or drink alcoholic beverages when taking pain medications.  POSTOPERATIVE CONSTIPATION PROTOCOL Constipation - defined medically as fewer than three stools per week and severe constipation as less than one stool per week.  One of the most common issues patients have following surgery is constipation.  Even if you have a regular bowel pattern at home, your normal regimen is likely to be disrupted due to multiple reasons following surgery.  Combination of anesthesia, postoperative narcotics, change in appetite and fluid intake all can affect your bowels.  In order to avoid complications following surgery, here are some recommendations in order to help you during your recovery period.  Colace (docusate) - Pick up an over-the-counter form of Colace or another stool softener and take twice a day as long as you are requiring postoperative pain medications.  Take with a full glass of water daily.  If you experience loose stools or diarrhea, hold the colace until you stool forms back up.  If your symptoms do not get better within 1 week or if they get worse, check with your doctor.  Dulcolax (bisacodyl) - Pick up over-the-counter and take as directed by the product packaging as needed to assist with the movement of your bowels.  Take with a full glass of water.  Use this product as needed if not relieved by Colace only.   MiraLax (polyethylene glycol) - Pick up over-the-counter to have on hand.  MiraLax is a solution that will increase the amount of water in your bowels to assist with  bowel movements.  Take as directed and can mix with a glass of water, juice, soda, coffee, or tea.  Take if you go more than two days without a movement. Do not use MiraLax more than once per day. Call your doctor if you are still constipated or irregular after using this medication for 7 days in a row.  If you continue to have problems with postoperative constipation, please contact the office for further  assistance and recommendations.  If you experience "the worst abdominal pain ever" or develop nausea or vomiting, please contact the office immediatly for further recommendations for treatment.   CALL THE OFFICE FOR: Temperature above 101 degrees Excessive bleeding or drainage on the dressing. Excessive swelling, coldness, or paleness of the toes. Persistent nausea and vomiting.  FOLLOW-UP:  You should have an appointment to return to the office in 14 days after surgery. Arrangements have been made for continuation of Physical Therapy (either home therapy or outpatient therapy).

## 2023-12-31 NOTE — TOC Transition Note (Signed)
 Transition of Care [facility]) - Discharge Note   Patient Details  Name: Daniel Kline MRN: [id number] Date of Birth: 07-10-1946  Transition of Care [facility]) CM/SW Contact:  Alexandra Ice, RN Phone Number: 12/31/2023, 11:29 AM   Clinical Narrative:     Patient to discharge today, home with home health services, he was current with [facility]. Sent message to Shaun with [facility]. Patient needing nocturnal oxygen set-up, referral sent to [facility] and was delivered to bedside.   Final next level of care: Home w Home Health Services Barriers to Discharge: Barriers Resolved   Patient Goals and CMS Choice Patient states their goals for this hospitalization and ongoing recovery are:: feel better CMS Medicare.gov Compare Post Acute Care list provided to:: Patient Choice offered to / list presented to : Patient      Discharge Placement                Patient to be transferred to facility by: Wife Name of family member notified: [name] "[name]" Ida Patient and family notified of of transfer: 12/31/23  Discharge Plan and Services Additional resources added to the After Visit Summary for                  DME Arranged: Oxygen DME Agency: [facility] Date DME Agency Contacted: 12/31/23 Time DME Agency Contacted: 1128 Representative spoke with at DME Agency: Harriet Limber, HH Arranged: PT, OT, RN [facility] Agency: [facility] Date HH Agency Contacted: 12/31/23 Time HH Agency Contacted: 1129 Representative spoke with at [facility] Agency: Shaun  Social Drivers of Health (SDOH) Interventions SDOH Screenings   Food Insecurity: Patient Unable To Answer (12/27/2023)  Housing: Patient Unable To Answer (12/27/2023)  Transportation Needs: Patient Unable To Answer (12/27/2023)  Utilities: Patient Unable To Answer (12/27/2023)  Depression (PHQ2-9): Low Risk  (12/15/2023)  Financial Resource Strain: Low Risk  (07/20/2017)  Social Connections: Patient Unable To Answer (12/27/2023)  Recent  Concern: Social Connections - Moderately Isolated (11/26/2023)  Tobacco Use: Medium Risk (12/27/2023)     Readmission Risk Interventions    11/30/2023    2:01 PM 09/06/2022    3:47 PM 01/27/2022   12:27 PM  Readmission Risk Prevention Plan  Transportation Screening Complete Complete Complete  PCP or Specialist Appt within 3-5 Days   Complete  HRI or Home Care Consult   Complete  Social Work Consult for Recovery Care Planning/Counseling   Complete  Palliative Care Screening   Complete  Medication Review [facility]) Complete Complete Complete  PCP or Specialist appointment within 3-5 days of discharge Complete    HRI or Home Care Consult Complete    SW Recovery Care/Counseling Consult Complete Complete   Palliative Care Screening Not Applicable Not Applicable   Skilled Nursing Facility Not Applicable

## 2024-01-01 LAB — AEROBIC/ANAEROBIC CULTURE W GRAM STAIN (SURGICAL/DEEP WOUND): Gram Stain: NONE SEEN

## 2024-01-03 ENCOUNTER — Telehealth: Payer: Self-pay

## 2024-01-03 ENCOUNTER — Telehealth: Payer: Self-pay | Admitting: *Deleted

## 2024-01-03 NOTE — Transitions of Care (Post Inpatient/ED Visit) (Signed)
   01/03/2024  Name: Daniel Kline MRN: [id number] DOB: [birth year]  Today's TOC FU Call Status: Today's TOC FU Call Status:: Unsuccessful Call (1st Attempt) Unsuccessful Call (1st Attempt) Date: 01/03/24  Attempted to reach the patient regarding the most recent Inpatient/ED visit.  Follow Up Plan: Additional outreach attempts will be made to reach the patient to complete the Transitions of Care (Post Inpatient/ED visit) call.   Brown Cape, RN, BSN, CCM [facility], [facility] RN Care Manager Direct Dial: [phone]

## 2024-01-03 NOTE — Telephone Encounter (Signed)
 I called the wife and let her know that he has an appointment tomorrow 5/28 at2:30 And if needed labs after that.  The wife says that she will be there tomorrow with him

## 2024-01-04 ENCOUNTER — Other Ambulatory Visit: Payer: Self-pay | Admitting: Internal Medicine

## 2024-01-04 ENCOUNTER — Inpatient Hospital Stay

## 2024-01-04 ENCOUNTER — Encounter: Payer: Self-pay | Admitting: Oncology

## 2024-01-04 ENCOUNTER — Inpatient Hospital Stay: Attending: Oncology | Admitting: Oncology

## 2024-01-04 VITALS — BP 149/79 | HR 55 | Temp 97.6°F | Resp 18 | Wt 230.0 lb

## 2024-01-04 DIAGNOSIS — E1121 Type 2 diabetes mellitus with diabetic nephropathy: Secondary | ICD-10-CM

## 2024-01-04 DIAGNOSIS — Z87891 Personal history of nicotine dependence: Secondary | ICD-10-CM | POA: Insufficient documentation

## 2024-01-04 DIAGNOSIS — N184 Chronic kidney disease, stage 4 (severe): Secondary | ICD-10-CM | POA: Insufficient documentation

## 2024-01-04 DIAGNOSIS — D631 Anemia in chronic kidney disease: Secondary | ICD-10-CM | POA: Diagnosis not present

## 2024-01-04 DIAGNOSIS — I129 Hypertensive chronic kidney disease with stage 1 through stage 4 chronic kidney disease, or unspecified chronic kidney disease: Secondary | ICD-10-CM | POA: Diagnosis not present

## 2024-01-04 DIAGNOSIS — Z7189 Other specified counseling: Secondary | ICD-10-CM | POA: Diagnosis not present

## 2024-01-04 DIAGNOSIS — D638 Anemia in other chronic diseases classified elsewhere: Secondary | ICD-10-CM

## 2024-01-04 DIAGNOSIS — E1122 Type 2 diabetes mellitus with diabetic chronic kidney disease: Secondary | ICD-10-CM | POA: Insufficient documentation

## 2024-01-04 DIAGNOSIS — E785 Hyperlipidemia, unspecified: Secondary | ICD-10-CM | POA: Insufficient documentation

## 2024-01-04 LAB — CBC WITH DIFFERENTIAL (CANCER CENTER ONLY)
Abs Immature Granulocytes: 0.02 10*3/uL (ref 0.00–0.07)
Basophils Absolute: 0.1 10*3/uL (ref 0.0–0.1)
Basophils Relative: 2 %
Eosinophils Absolute: 0.2 10*3/uL (ref 0.0–0.5)
Eosinophils Relative: 2 %
HCT: 27.9 % — ABNORMAL LOW (ref 39.0–52.0)
Hemoglobin: 8.8 g/dL — ABNORMAL LOW (ref 13.0–17.0)
Immature Granulocytes: 0 %
Lymphocytes Relative: 21 %
Lymphs Abs: 1.3 10*3/uL (ref 0.7–4.0)
MCH: 30.9 pg (ref 26.0–34.0)
MCHC: 31.5 g/dL (ref 30.0–36.0)
MCV: 97.9 fL (ref 80.0–100.0)
Monocytes Absolute: 0.4 10*3/uL (ref 0.1–1.0)
Monocytes Relative: 7 %
Neutro Abs: 4.3 10*3/uL (ref 1.7–7.7)
Neutrophils Relative %: 68 %
Platelet Count: 145 10*3/uL — ABNORMAL LOW (ref 150–400)
RBC: 2.85 MIL/uL — ABNORMAL LOW (ref 4.22–5.81)
RDW: 16 % — ABNORMAL HIGH (ref 11.5–15.5)
WBC Count: 6.2 10*3/uL (ref 4.0–10.5)
nRBC: 0 % (ref 0.0–0.2)

## 2024-01-04 LAB — IRON AND TIBC
Iron: 49 ug/dL (ref 45–182)
Saturation Ratios: 19 % (ref 17.9–39.5)
TIBC: 252 ug/dL (ref 250–450)
UIBC: 203 ug/dL

## 2024-01-04 LAB — FOLATE: Folate: >40 ng/mL (ref >5.9)

## 2024-01-04 LAB — VITAMIN B12: Vitamin B-12: 849 pg/mL (ref 180–914)

## 2024-01-04 LAB — FERRITIN: Ferritin: 238 ng/mL (ref 24–336)

## 2024-01-04 NOTE — Progress Notes (Signed)
 Patient has an infection in his leg. He is anemic.

## 2024-01-05 ENCOUNTER — Telehealth: Payer: Self-pay

## 2024-01-05 ENCOUNTER — Ambulatory Visit: Payer: Self-pay | Admitting: Oncology

## 2024-01-05 DIAGNOSIS — D638 Anemia in other chronic diseases classified elsewhere: Secondary | ICD-10-CM

## 2024-01-05 NOTE — Telephone Encounter (Signed)
-----   Message from Avonne Boettcher sent at 01/05/2024  7:16 AM EDT ----- He needs retacrit Q4 weeks with H/H. See me in 3 months with cbc ferritin and iron studies

## 2024-01-05 NOTE — Transitions of Care (Post Inpatient/ED Visit) (Signed)
 01/05/2024  Name: Daniel Kline MRN: [id number] DOB: 1946-01-05  Today's TOC FU Call Status: Today's TOC FU Call Status:: Successful TOC FU Call Completed TOC FU Call Complete Date: 01/05/24 Patient's Name and Date of Birth confirmed.  Transition Care Management Follow-up Telephone Call Date of Discharge: 12/31/23 Discharge Facility: [facility]) Type of Discharge: Inpatient Admission Primary Inpatient Discharge Diagnosis:: Hallucinations, cellulitis RLE, noctural hypoxia How have you been since you were released from the hospital?: Same Any questions or concerns?: No  Items Reviewed: Did you receive and understand the discharge instructions provided?: Yes Medications obtained,verified, and reconciled?: Yes (Medications Reviewed) Any new allergies since your discharge?: No Dietary orders reviewed?: Yes Type of Diet Ordered:: Heart healthy - eating better Do you have support at home?: Yes People in Home [RPT]: spouse Name of Support/Comfort Primary Source: [name] "[name]" [name]  Medications Reviewed Today: Medications Reviewed Today     Reviewed by Jamie Mccoy, RN (Registered Nurse) on 01/05/24 at [phone]  Med List Status: <None>   Medication Order Taking? Sig Documenting Provider Last Dose Status Informant  amoxicillin -clavulanate (AUGMENTIN ) 500-125 MG tablet [id number] Yes Take 1 tablet by mouth 2 (two) times daily for 7 days. Verla Glaze, MD Taking Active   calcitRIOL (ROCALTROL) 0.25 MCG capsule [id number] Yes Take 0.25 mcg by mouth daily. [provider] Taking Active Pharmacy Records, Self  carvedilol  (COREG ) 3.125 MG tablet [id number] Yes TAKE 1 TABLET BY MOUTH 2 TIMES DAILY WITH A MEAL. Helaine Llanos, MD Taking Active Pharmacy Records, Self  Cholecalciferol  (VITAMIN D -3) 25 MCG (1000 UT) CAPS [id number] Yes Take 1,000 Units by mouth daily. [provider] Taking Active Pharmacy Records, Self  clorazepate  (TRANXENE )  7.5 MG tablet [id number] Yes TAKE 1 TABLET BY MOUTH TWICE A DAY AS NEEDED FOR ANXIETY Helaine Llanos, MD Taking Active Self, Pharmacy Records           Med Note [place], Britta Candy Jan 05, 2024  9:20 AM) Take only as needed  cyanocobalamin (VITAMIN B12) 500 MCG tablet [id number] Yes Take 1,000 mcg by mouth daily. [provider] Taking Active Pharmacy Records, Self  doxycycline  (VIBRAMYCIN ) 100 MG capsule [id number] Yes Take 1 capsule (100 mg total) by mouth 2 (two) times daily for 7 days. Verla Glaze, MD Taking Active   EPINEPHrine  0.3 mg/0.3 mL IJ SOAJ injection [id number]  Inject 0.3 mg into the muscle as needed for anaphylaxis. Helaine Llanos, MD  Active Pharmacy Records, Self           Med Note [place], Britta Candy Jan 05, 2024  9:22 AM) Only takes as needed  Ferrous Sulfate  (IRON PO) [id number] Yes Take 1 tablet by mouth at bedtime. [provider] Taking Active Pharmacy Records, Self           Med Note Dede Fanny May 11, 2021  2:15 PM)    folic acid  (FOLVITE ) 1 MG tablet [id number] Yes Take 1 mg by mouth daily. [provider] Taking Active Self, Pharmacy Records  furosemide  (LASIX ) 40 MG tablet [id number] Yes Take 40 mg by mouth daily. [provider] Taking Active Self, Pharmacy Records           Med Note Miriam American, Rowan Cooter   Fri Dec 16, 2023  9:18 AM) Wife states started back on   gabapentin  (NEURONTIN ) 100 MG capsule [id number] Yes Take 2 capsules (200 mg total) by mouth 3 (three) times daily. Wieting,  Richard, MD Taking Active   glucose blood [facility] VERIO) test strip [id number] Yes Use to check blood sugar once daily Helaine Llanos, MD Taking Active Self, Pharmacy Records  HYDROcodone -acetaminophen  (NORCO/VICODIN) 5-325 MG tablet [id number] Yes Take 1 tablet by mouth every 6 (six) hours as needed. for pain Helaine Llanos, MD Taking Active Self, Pharmacy Records           Med Note [place], Britta Candy Jan 05, 2024   9:23 AM) Taking only as needed  hydrocortisone  2.5 % cream [id number] Yes Apply topically 3 (three) times daily as needed. Helaine Llanos, MD Taking Active Pharmacy Records, Self           Med Note [facility], ELIZABETH A   Tue Dec 27, 2023  7:53 PM) PRN  isosorbide  mononitrate (IMDUR ) 60 MG 24 hr tablet [id number] Yes Take 1 tablet (60 mg total) by mouth daily before lunch. Gollan, Timothy J, MD Taking Active Pharmacy Records, Self  latanoprost  (XALATAN ) 0.005 % ophthalmic solution [id number] Yes Place 1 drop into both eyes at bedtime. [provider] Taking Active Pharmacy Records, Self  MAGNESIUM  PO [id number] Yes Take 1 tablet by mouth daily. [provider] Taking Active Pharmacy Records, Self  melatonin 5 MG TABS [id number] No Take 1 tablet (5 mg total) by mouth at bedtime as needed.  Patient not taking: Reported on 01/05/2024   Verla Glaze, MD Not Taking Active            Med Note Miriam American, Britta Candy Jan 05, 2024  9:25 AM) Only PRN but has it if needed  pantoprazole  (PROTONIX ) 40 MG tablet [id number] Yes Take 1 tablet (40 mg total) by mouth 2 (two) times daily. Alphonsus Jeans, MD Taking Active Self, Pharmacy Records  QUEtiapine (SEROQUEL) 50 MG tablet [id number] Yes Take 1 tablet (50 mg total) by mouth at bedtime. Verla Glaze, MD Taking Active   rosuvastatin  (CRESTOR ) 10 MG tablet [id number] Yes TAKE ONE TABLET BY MOUTH EVERY DAY Helaine Llanos, MD Taking Active             Home Care and Equipment/Supplies: Any new equipment or medical supplies ordered?: Yes Name of Medical supply agency?: oxygen Were you able to get the equipment/medical supplies?: Yes Do you have any questions related to the use of the equipment/supplies?: No    Follow up appointments reviewed: PCP Follow-up appointment confirmed?: Yes Date of PCP follow-up appointment?: 01/06/24 Follow-up Provider: Curt Dover, MD Specialist Hospital Follow-up appointment confirmed?:  Yes Date of Specialist follow-up appointment?: 01/04/24 Follow-Up Specialty Provider:: Oncology/Hematolgy Do you need transportation to your follow-up appointment?: No Do you understand care options if your condition(s) worsen?: Yes-patient verbalized understanding  SDOH Interventions Today    Flowsheet Row Most Recent Value  SDOH Interventions   Food Insecurity Interventions Intervention Not Indicated  Housing Interventions Intervention Not Indicated  Transportation Interventions Intervention Not Indicated  Utilities Interventions Intervention Not Indicated       Goals Addressed             This Visit's Progress    VBCI Transitions of Care (TOC) Care Plan       Problems:  Recent Hospitalization for treatment of Noctural Hypoxia with hallucinations, Anemia/GIB, cellulitis RLE HX HF Knowledge Deficit Related to importance of oxygen at night as ordered and daily weights with HF; new regimen for anemia  Goal:  Over the next 30 days, the patient will not experience hospital readmission  Interventions:  Discussed with [name] weighing daily and checking Blood sugar and BP  Patient Self Care Activities:  Attend all scheduled provider appointments Call pharmacy for medication refills 3-7 days in advance of running out of medications Call provider office for new concerns or questions  Notify RN Care Manager of TOC call rescheduling needs Participate in Transition of Care Program/Attend TOC scheduled calls  Plan:  Telephone follow up appointment with care management team member scheduled for:  01/12/24 at 3 pm The care management team will reach out to the patient again over the next 7 days.       Brown Cape, RN, BSN, CCM [facility], [facility] RN Care Manager Direct Dial: [phone]

## 2024-01-05 NOTE — Patient Instructions (Signed)
 Visit Information  Thank you for taking time to visit with me today. Please don't hesitate to contact me if I can be of assistance to you before our next scheduled telephone appointment.  Our next appointment is by telephone on January 12, 2024 at 3 PM  Following is a copy of your care plan:   Goals Addressed             This Visit's Progress    VBCI Transitions of Care (TOC) Care Plan       Problems:  Recent Hospitalization for treatment of Noctural Hypoxia with hallucinations, Anemia/GIB, cellulitis RLE HX HF Knowledge Deficit Related to importance of oxygen at night as ordered and daily weights with HF; new regimen for anemia  Goal:  Over the next 30 days, the patient will not experience hospital readmission  Interventions:  Discussed with [name] weighing daily and checking Blood sugar and BP  Patient Self Care Activities:  Attend all scheduled provider appointments Call pharmacy for medication refills 3-7 days in advance of running out of medications Call provider office for new concerns or questions  Notify RN Care Manager of TOC call rescheduling needs Participate in Transition of Care Program/Attend [facility] scheduled calls  Plan:  Telephone follow up appointment with care management team member scheduled for:  01/12/24 at 3 pm The care management team will reach out to the patient again over the next 7 days.        Patient verbalizes understanding of instructions and care plan provided today and agrees to view in MyChart. Active MyChart status and patient understanding of how to access instructions and care plan via MyChart confirmed with patient.     The patient has been provided with contact information for the care management team and has been advised to call with any health related questions or concerns.  Follow up with provider re: new medication for anemia, wife states Onc/Hemo is to call them back with when to start injections with possible -  Retacrit  Please call the  care guide team at [phone] if you need to cancel or reschedule your appointment.   Please call the [place]  National Suicide Prevention Lifeline: 281-761-8469 or TTY: 613-769-4246 TTY 636 267 9500) to talk to a trained counselor if you are experiencing a Mental Health or Behavioral Health Crisis or need someone to talk to.  Brown Cape, RN, BSN, CCM [facility], [facility] RN Care Manager Direct Dial: [phone]

## 2024-01-05 NOTE — Progress Notes (Signed)
 Phone call to [name] (spouse) gave her a message to have patient call back. Not able to leave voicemail for patient

## 2024-01-05 NOTE — Telephone Encounter (Signed)
 Per Dr. Randy Buttery "He needs retacrit Q4 weeks with H/H. See me in 3 months with cbc ferritin and iron studies".  Outbound call; spoke to caregiver / spouse Adriana Hopping, informed of above.  Also informed spouse that scheduling will be in touch shortly to coordinate.  Spouse verbalized understanding.

## 2024-01-06 ENCOUNTER — Encounter: Payer: Self-pay | Admitting: Internal Medicine

## 2024-01-06 ENCOUNTER — Ambulatory Visit: Payer: Self-pay | Admitting: Internal Medicine

## 2024-01-06 ENCOUNTER — Telehealth: Payer: Self-pay

## 2024-01-06 ENCOUNTER — Ambulatory Visit: Admitting: Internal Medicine

## 2024-01-06 VITALS — BP 158/90 | HR 61 | Temp 97.9°F | Ht 66.0 in | Wt 239.0 lb

## 2024-01-06 DIAGNOSIS — J9611 Chronic respiratory failure with hypoxia: Secondary | ICD-10-CM | POA: Diagnosis not present

## 2024-01-06 DIAGNOSIS — L97911 Non-pressure chronic ulcer of unspecified part of right lower leg limited to breakdown of skin: Secondary | ICD-10-CM | POA: Diagnosis not present

## 2024-01-06 DIAGNOSIS — E1121 Type 2 diabetes mellitus with diabetic nephropathy: Secondary | ICD-10-CM | POA: Diagnosis not present

## 2024-01-06 DIAGNOSIS — N184 Chronic kidney disease, stage 4 (severe): Secondary | ICD-10-CM

## 2024-01-06 DIAGNOSIS — F29 Unspecified psychosis not due to a substance or known physiological condition: Secondary | ICD-10-CM | POA: Insufficient documentation

## 2024-01-06 LAB — RENAL FUNCTION PANEL
Albumin: 3.3 g/dL — ABNORMAL LOW (ref 3.5–5.2)
BUN: 37 mg/dL — ABNORMAL HIGH (ref 6–23)
CO2: 28 meq/L (ref 19–32)
Calcium: 9.1 mg/dL (ref 8.4–10.5)
Chloride: 105 meq/L (ref 96–112)
Creatinine, Ser: 2.46 mg/dL — ABNORMAL HIGH (ref 0.40–1.50)
GFR: 24.34 mL/min — ABNORMAL LOW (ref >60.00)
Glucose, Bld: 155 mg/dL — ABNORMAL HIGH (ref 70–99)
Phosphorus: 3.4 mg/dL (ref 2.3–4.6)
Potassium: 4.6 meq/L (ref 3.5–5.1)
Sodium: 141 meq/L (ref 135–145)

## 2024-01-06 LAB — CBC WITH DIFFERENTIAL/PLATELET
Basophils Absolute: 0.1 10*3/uL (ref 0.0–0.1)
Basophils Relative: 1.8 % (ref 0.0–3.0)
Eosinophils Absolute: 0.2 10*3/uL (ref 0.0–0.7)
Eosinophils Relative: 3.1 % (ref 0.0–5.0)
HCT: 27 % — ABNORMAL LOW (ref 39.0–52.0)
Hemoglobin: 8.7 g/dL — ABNORMAL LOW (ref 13.0–17.0)
Lymphocytes Relative: 28 % (ref 12.0–46.0)
Lymphs Abs: 1.8 10*3/uL (ref 0.7–4.0)
MCHC: 32.4 g/dL (ref 30.0–36.0)
MCV: 96.3 fl (ref 78.0–100.0)
Monocytes Absolute: 0.6 10*3/uL (ref 0.1–1.0)
Monocytes Relative: 8.9 % (ref 3.0–12.0)
Neutro Abs: 3.7 10*3/uL (ref 1.4–7.7)
Neutrophils Relative %: 58.2 % (ref 43.0–77.0)
Platelets: 154 10*3/uL (ref 150.0–400.0)
RBC: 2.81 Mil/uL — ABNORMAL LOW (ref 4.22–5.81)
RDW: 18 % — ABNORMAL HIGH (ref 11.5–15.5)
WBC: 6.3 10*3/uL (ref 4.0–10.5)

## 2024-01-06 LAB — IBC PANEL
Iron: 54 ug/dL (ref 42–165)
Saturation Ratios: 21.5 % (ref 20.0–50.0)
TIBC: 250.6 ug/dL (ref 250.0–450.0)
Transferrin: 179 mg/dL — ABNORMAL LOW (ref 212.0–360.0)

## 2024-01-06 LAB — FERRITIN: Ferritin: 358.3 ng/mL — ABNORMAL HIGH (ref 22.0–322.0)

## 2024-01-06 NOTE — Progress Notes (Signed)
 Called patient again today- no answer and no voicemail available.

## 2024-01-06 NOTE — Assessment & Plan Note (Signed)
 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

## 2024-01-06 NOTE — Assessment & Plan Note (Signed)
 Stasis ulcerations with secondary cellulitis Mostly better Will finish out doxy/augmentin 

## 2024-01-06 NOTE — Progress Notes (Signed)
 Subjective:    Patient ID: Daniel Kline, male    DOB: Jul 30, 1946, 78 y.o.   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 Got 1 unit of bood Treated with cefazolin  for leg cellulitis and sent home on augmentin /doxy Nocturnal hypoxemia found---sent home with O2 for home use Discharged 5/24 A1c was 5.7%---glipizide  held (stopped)  Doing better now No recurrence of hallucinations since home Not sleeping much at night--but wife notes he sleeps all day Using the oxygen at night Leg is better  Off all diabetes meds--130 fasting  Now seeing Dr Randy Buttery for the anemia--will be getting some infusion  Current Outpatient Medications on File Prior to Visit  Medication Sig Dispense Refill   amoxicillin -clavulanate (AUGMENTIN ) 500-125 MG tablet Take 1 tablet by mouth 2 (two) times daily for 7 days. 14 tablet 0   calcitRIOL (ROCALTROL) 0.25 MCG capsule Take 0.25 mcg by mouth daily.     carvedilol  (COREG ) 3.125 MG tablet TAKE 1 TABLET BY MOUTH 2 TIMES DAILY WITH A MEAL. 180 tablet 3   Cholecalciferol  (VITAMIN D -3) 25 MCG (1000 UT) CAPS Take 1,000 Units by mouth daily.     clorazepate  (TRANXENE ) 7.5 MG tablet TAKE 1 TABLET BY MOUTH TWICE A DAY AS NEEDED FOR ANXIETY 60 tablet 0   cyanocobalamin (VITAMIN B12) 500 MCG tablet Take 1,000 mcg by mouth daily.     doxycycline  (VIBRAMYCIN ) 100 MG capsule Take 1 capsule (100 mg total) by mouth 2 (two) times daily for 7 days. 14 capsule 0   EPINEPHrine  0.3 mg/0.3 mL IJ SOAJ injection Inject 0.3 mg into the muscle as needed for anaphylaxis. 1 each 5   Ferrous Sulfate  (IRON PO) Take 1 tablet by mouth at bedtime.     folic acid  (FOLVITE ) 1 MG tablet Take 1 mg by mouth daily.     furosemide  (LASIX ) 40 MG tablet Take 40 mg by  mouth daily.     gabapentin  (NEURONTIN ) 100 MG capsule Take 2 capsules (200 mg total) by mouth 3 (three) times daily. 180 capsule 0   glucose blood (ONETOUCH VERIO) test strip Use to check blood sugar once daily 100 each 12   HYDROcodone -acetaminophen  (NORCO/VICODIN) 5-325 MG tablet Take 1 tablet by mouth every 6 (six) hours as needed. for pain 60 tablet 0   hydrocortisone  2.5 % cream Apply topically 3 (three) times daily as needed. 56 g 3   isosorbide  mononitrate (IMDUR ) 60 MG 24 hr tablet Take 1 tablet (60 mg total) by mouth daily before lunch. 90 tablet 3   latanoprost  (XALATAN ) 0.005 % ophthalmic solution Place 1 drop into both eyes at bedtime.     MAGNESIUM  PO Take 1 tablet by mouth daily.     melatonin 5 MG TABS Take 1 tablet (5 mg total) by mouth at bedtime as needed. 30 tablet 0   pantoprazole  (PROTONIX ) 40 MG tablet Take 1 tablet (40 mg total) by mouth 2 (two) times daily. 60 tablet 0   QUEtiapine (SEROQUEL) 50 MG tablet Take 1 tablet (50 mg total) by mouth at bedtime. 30 tablet 0   rosuvastatin  (CRESTOR ) 10 MG tablet TAKE ONE TABLET BY MOUTH EVERY DAY 90 tablet 3   No current facility-administered medications on file prior to visit.    Allergies  Allergen Reactions  Bee Venom Anaphylaxis   Oxycodone  Other (See Comments)    Delusions   Hydromorphone  Other (See Comments)    hallucinating   Hydroxychloroquine     Other reaction(s): Other (See Comments) He broke out really badly.   Zolpidem Other (See Comments)    Past Medical History:  Diagnosis Date   Anemia    H/O   Anxiety    Arthritis    Atrial flutter (HCC)    a. s/p post ablation in 04/2017   Chronic kidney disease    Complication of anesthesia    CTCL (cutaneous T-cell lymphoma) (HCC)    Diabetes mellitus without complication (HCC)    Diastolic dysfunction    a. 03/2022 Echo: EF 60-65%, no rwma, GrI DD, nl RV fxn.   Dysplastic nevus 12/19/2017   Right distal lat. forearm near wrist. Severe atypia, close to  peripheral margin.   Dysplastic nevus 06/21/2018   Upper back right paraspinal. Severe atypia, peripheral margin involved. Excised 07/11/2018, margins free.   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 forearm. SCCis, hypertrophic.    Past Surgical History:  Procedure Laterality Date   BACK SURGERY     LUMBAR   CARDIAC ELECTROPHYSIOLOGY STUDY AND ABLATION  2019   CATARACT EXTRACTION W/PHACO Right 05/05/2022   Procedure: CATARACT EXTRACTION PHACO AND INTRAOCULAR LENS PLACEMENT (IOC) RIGHT;  Surgeon: Annell Kidney, MD;  Location: [facility];  Service: Ophthalmology;  Laterality: Right;  Diabetic 10.42 01:13.4   COLONOSCOPY WITH PROPOFOL  N/A 04/04/2019   Procedure: COLONOSCOPY WITH PROPOFOL ;  Surgeon: Toledo, Alphonsus Jeans, MD;  Location: [facility];  Service: Gastroenterology;  Laterality: N/A;   ESOPHAGOGASTRODUODENOSCOPY N/A 11/28/2023   Procedure: EGD (ESOPHAGOGASTRODUODENOSCOPY);  Surgeon: Marnee Sink, MD;  Location: [facility];  Service: Endoscopy;  Laterality: N/A;   I & D EXTREMITY Right 02/01/2020   Procedure: IRRIGATION AND DEBRIDEMENT EXTREMITY with poly exchange;  Surgeon: Arlyne Lame, MD;  Location: [facility];  Service: Orthopedics;  Laterality: Right;   INCISION AND DRAINAGE     BACK-MRSA INFECTION AFTER BACK SURGERY   KNEE ARTHROPLASTY Right 01/28/2020   Procedure: COMPUTER ASSISTED TOTAL KNEE ARTHROPLASTY;  Surgeon: Arlyne Lame, MD;  Location: [facility];  Service: Orthopedics;   Laterality: Right;   MOUTH SURGERY     right elbow surgery     right knee surgery     right shoulder surgery     from polio damage   TONSILLECTOMY     TOTAL HIP ARTHROPLASTY Bilateral 04/2016    Family History  Adopted: Yes    Social History   Socioeconomic History   Marital status: Married    Spouse name: Not on file   Number of children: 0   Years of education: Not on file   Highest education level: Not on file  Occupational History   Occupation: Corporate investment banker    Comment: 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   Occupation: Engineering geologist  Comment: Retired   Occupation: Scientist, research (medical)    Comment: Retired  Tobacco Use   Smoking status: Former    Types: Cigars    Quit date: 09/23/1990    Years since quitting: 33.3    Passive exposure: Past (as a child)   Smokeless tobacco: Former    Quit date: 02/25/2006  Vaping Use   Vaping status: Never Used  Substance and Sexual Activity   Alcohol use: No    Alcohol/week: 0.0 standard drinks of alcohol   Drug use: No   Sexual activity: Yes    Partners: Female  Other Topics Concern   Not on file  Social History Narrative   Has living will   Wife is health care POA---then brother or sister   Would accept resuscitation attempts but no prolonged ventilation or tube feeds   Social Drivers of Health   Financial Resource Strain: Low Risk  (07/20/2017)   Overall Financial Resource Strain (CARDIA)    Difficulty of Paying Living Expenses: Not hard at all  Food Insecurity: No Food Insecurity (01/05/2024)   Hunger Vital Sign    Worried About Running Out of Food in the Last Year: Never true    Ran Out of Food in the Last Year: Never true  Transportation Needs: No Transportation Needs (01/05/2024)   PRAPARE - Administrator, Civil Service (Medical): No    Lack of Transportation (Non-Medical): No  Physical Activity: Not on file  Stress: Not on file  Social Connections: Patient Unable To Answer  (12/27/2023)   Social Connection and Isolation Panel [NHANES]    Frequency of Communication with Friends and Family: Patient unable to answer    Frequency of Social Gatherings with Friends and Family: Patient unable to answer    Attends Religious Services: Patient unable to answer    Active Member of Clubs or Organizations: Patient unable to answer    Attends Banker Meetings: Patient unable to answer    Marital Status: Patient unable to answer  Recent Concern: Social Connections - Moderately Isolated (11/26/2023)   Social Connection and Isolation Panel [NHANES]    Frequency of Communication with Friends and Family: More than three times a week    Frequency of Social Gatherings with Friends and Family: Once a week    Attends Religious Services: Never    Database administrator or Organizations: No    Attends Banker Meetings: Never    Marital Status: Married  Catering manager Violence: Patient Unable To Answer (01/05/2024)   Humiliation, Afraid, Rape, and Kick questionnaire    Fear of Current or Ex-Partner: Patient unable to answer    Emotionally Abused: Patient unable to answer    Physically Abused: Patient unable to answer    Sexually Abused: Patient unable to answer   Review of Systems Till with left hip pain--limits his movement Walks with cane Appetite is fine     Objective:   Physical Exam Constitutional:      General: He is not in acute distress. Cardiovascular:     Rate and Rhythm: Normal rate and regular rhythm.     Heart sounds:     No gallop.     Comments: Gr 3/6 systolic murmur--coarse at base and blowing at apex Pulmonary:     Effort: Pulmonary effort is normal.     Breath sounds: Normal breath sounds. No wheezing or rales.  Musculoskeletal:     Cervical back: Neck supple.     Comments: Thick  calves without pitting  Lymphadenopathy:     Cervical: No cervical adenopathy.  Skin:    Comments: Superficial ulcerations both legs Mild  redness surrounding eschar covered ulcers on right  Neurological:     Mental Status: He is alert.  Psychiatric:        Mood and Affect: Mood normal.        Behavior: Behavior normal.            Assessment & Plan:

## 2024-01-06 NOTE — Assessment & Plan Note (Signed)
 Hopefully has stabilized Will hold the farxiga for now

## 2024-01-06 NOTE — Assessment & Plan Note (Addendum)
 Delusions and hallucinations Likely mutlifactorial Now better with the quetiapine 50mg  nightly Has mild cognitive changes--but no clear dementia at this point

## 2024-01-06 NOTE — Addendum Note (Signed)
 Addended by: Gerry Krone on: 01/06/2024 09:52 AM   Modules accepted: Orders

## 2024-01-06 NOTE — Assessment & Plan Note (Signed)
 Lab Results  Component Value Date   HGBA1C 5.7 (H) 12/27/2023   Stop glipizide  Will not restart farxiga for now

## 2024-01-06 NOTE — Addendum Note (Signed)
 Addended by: Curt Dover I on: 01/06/2024 09:42 AM   Modules accepted: Orders

## 2024-01-08 ENCOUNTER — Encounter: Payer: Self-pay | Admitting: Oncology

## 2024-01-08 DIAGNOSIS — D631 Anemia in chronic kidney disease: Secondary | ICD-10-CM | POA: Insufficient documentation

## 2024-01-08 DIAGNOSIS — N184 Chronic kidney disease, stage 4 (severe): Secondary | ICD-10-CM | POA: Insufficient documentation

## 2024-01-08 NOTE — Progress Notes (Signed)
 Hematology/Oncology Consult note [facility]  [phone] [phone]  Patient Care Team: Helaine Llanos, MD as PCP - General (Internal Medicine) Devorah Fonder, MD as PCP - Cardiology (Cardiology) Jonathan Neighbor, [facility] (Inactive) as Pharmacist (Pharmacist) Green, Davina E, RN as [facility] Care Management Avonne Boettcher, MD as Consulting Physician (Oncology) Jamie Mccoy, RN as [facility] Care Management   Name of the patient: Daniel Kline  [id number]  March 16, 1946   Date of visit: 01/08/24  Diagnosis-anemia likely secondary to chronic kidney disease  Chief complaint/ Reason for visit-posthospital discharge follow-up  Heme/Onc history: Patient is a 78 year old male with a past medical history significant for hypertension hyperlipidemia type 2 diabetes CKD who was last seen by me in June 2023 while he was in the hospital with COVID and I was consulted for neutropenia.  Patient was subsequently admitted to the hospital in April 2025 for symptoms of fatigue and fall and was found to have significant pancytopenia with a hemoglobin of 6.6, platelets of 5 as well as neutropenia.  He was on methotrexate  weekly which she continued in the setting of worsening renal dysfunction and therefore methotrexate  was stopped to see if his counts recover.  Upon discharge from the hospital patient was noted to have white count of 6.3, H&H of 8.7/27 with a platelet count of 154.  Interval history-he is sitting in a wheelchair today.  Overall he reports feeling fatigued  ECOG PS- 3 Pain scale- 4   Review of systems- Review of Systems  Constitutional:  Positive for malaise/fatigue. Negative for chills, fever and weight loss.  HENT:  Negative for congestion, ear discharge and nosebleeds.   Eyes:  Negative for blurred vision.  Respiratory:  Negative for cough, hemoptysis, sputum production, shortness of breath and wheezing.    Cardiovascular:  Negative for chest pain, palpitations, orthopnea and claudication.  Gastrointestinal:  Negative for abdominal pain, blood in stool, constipation, diarrhea, heartburn, melena, nausea and vomiting.  Genitourinary:  Negative for dysuria, flank pain, frequency, hematuria and urgency.  Musculoskeletal:  Negative for back pain, joint pain and myalgias.  Skin:  Negative for rash.  Neurological:  Negative for dizziness, tingling, focal weakness, seizures, weakness and headaches.  Endo/Heme/Allergies:  Does not bruise/bleed easily.  Psychiatric/Behavioral:  Negative for depression and suicidal ideas. The patient does not have insomnia.       Allergies  Allergen Reactions   Bee Venom Anaphylaxis   Oxycodone  Other (See Comments)    Delusions   Hydromorphone  Other (See Comments)    hallucinating   Hydroxychloroquine     Other reaction(s): Other (See Comments) He broke out really badly.   Zolpidem Other (See Comments)     Past Medical History:  Diagnosis Date   Anemia    H/O   Anxiety    Arthritis    Atrial flutter (HCC)    a. s/p post ablation in 04/2017   Chronic kidney disease    Complication of anesthesia    CTCL (cutaneous T-cell lymphoma) (HCC)    Diabetes mellitus without complication (HCC)    Diastolic dysfunction    a. 03/2022 Echo: EF 60-65%, no rwma, GrI DD, nl RV fxn.   Dysplastic nevus 12/19/2017   Right distal lat. forearm near wrist. Severe atypia, close to peripheral margin.   Dysplastic nevus 06/21/2018   Upper back right paraspinal. Severe atypia, peripheral margin involved. Excised 07/11/2018, margins free.   Family history of adverse reaction to anesthesia  PT WAS ADOPTED   HLD (hyperlipidemia)    HTN (hypertension)    Hx of dysplastic nevus 2019   multiple sites   Hx of squamous cell carcinoma 01/18/2018   R mid lateral forearm   MRSA (methicillin resistant Staphylococcus aureus)    after back surgery   OSA (obstructive sleep apnea)     USES BIPAP   Polio    POLIOMYELITIS 01/12/2010   Right arm affected   PONV (postoperative nausea and vomiting)    Squamous cell carcinoma of skin 12/19/2017   Right mid lat. forearm. SCCis, hypertrophic.     Past Surgical History:  Procedure Laterality Date   BACK SURGERY     LUMBAR   CARDIAC ELECTROPHYSIOLOGY STUDY AND ABLATION  2019   CATARACT EXTRACTION W/PHACO Right 05/05/2022   Procedure: CATARACT EXTRACTION PHACO AND INTRAOCULAR LENS PLACEMENT (IOC) RIGHT;  Surgeon: Annell Kidney, MD;  Location: [facility];  Service: Ophthalmology;  Laterality: Right;  Diabetic 10.42 01:13.4   COLONOSCOPY WITH PROPOFOL  N/A 04/04/2019   Procedure: COLONOSCOPY WITH PROPOFOL ;  Surgeon: Toledo, Alphonsus Jeans, MD;  Location: [facility] END[facility]COPY;  Service: Gastroenterology;  Laterality: N/A;   ESOPHAGOGASTRODUODENOSCOPY N/A 11/28/2023   Procedure: EGD (ESOPHAGOGASTRODUODENOSCOPY);  Surgeon: Marnee Sink, MD;  Location: [facility] END[facility];  Service: Endoscopy;  Laterality: N/A;   I & D EXTREMITY Right 02/01/2020   Procedure: IRRIGATION AND DEBRIDEMENT EXTREMITY with poly exchange;  Surgeon: Arlyne Lame, MD;  Location: [facility] ORS;  Service: Orthopedics;  Laterality: Right;   INCISION AND DRAINAGE     BACK-MRSA INFECTION AFTER BACK SURGERY   KNEE ARTHROPLASTY Right 01/28/2020   Procedure: COMPUTER ASSISTED TOTAL KNEE ARTHROPLASTY;  Surgeon: Arlyne Lame, MD;  Location: [facility] OR[facility];  Service: Orthopedics;  Laterality: Right;   MOUTH SURGERY     right elbow surgery     right knee surgery     right shoulder surgery     from polio damage   TONSILLECTOMY     TOTAL HIP ARTHROPLASTY Bilateral 04/2016    Social History   Socioeconomic History   Marital status: Married    Spouse name: Not on file   Number of children: 0   Years of education: Not on file   Highest education level: Not on file  Occupational History   Occupation: Corporate investment banker    Comment: 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   Occupation: Psychologist, clinical    Comment: Retired   Occupation: Scientist, research (medical)    Comment: Retired  Tobacco Use   Smoking status: Former    Types: Cigars    Quit date: 09/23/1990    Years since quitting: 33.3    Passive exposure: Past (as a child)   Smokeless tobacco: Former    Quit date: 02/25/2006  Vaping Use   Vaping status: Never Used  Substance and Sexual Activity   Alcohol use: No    Alcohol/week: 0.0 standard drinks of alcohol   Drug use: No   Sexual activity: Yes    Partners: Female  Other Topics Concern   Not on file  Social History Narrative   Has living will   Wife is health care POA---then brother or sister   Would accept resuscitation attempts but no prolonged ventilation or tube feeds   Social Drivers of Health   Financial Resource Strain: Low Risk  (07/20/2017)   Overall Financial Resource Strain (CARDIA)    Difficulty of Paying Living Expenses: Not hard at all  Food Insecurity: No Food Insecurity (01/05/2024)   Hunger Vital  Sign    Worried About Programme researcher, broadcasting/film/video in the Last Year: Never true    Ran Out of Food in the Last Year: Never true  Transportation Needs: No Transportation Needs (01/05/2024)   PRAPARE - Administrator, Civil Service (Medical): No    Lack of Transportation (Non-Medical): No  Physical Activity: Not on file  Stress: Not on file  Social Connections: Patient Unable To Answer (12/27/2023)   Social Connection and Isolation Panel [NHANES]    Frequency of Communication with Friends and Family: Patient unable to answer    Frequency of Social Gatherings with Friends and Family: Patient unable to answer    Attends Religious Services: Patient unable to answer    Active Member of Clubs or Organizations: Patient unable to answer    Attends Banker Meetings: Patient unable to answer    Marital Status: Patient unable to answer  Recent Concern: Social Connections - Moderately Isolated (11/26/2023)    Social Connection and Isolation Panel [NHANES]    Frequency of Communication with Friends and Family: More than three times a week    Frequency of Social Gatherings with Friends and Family: Once a week    Attends Religious Services: Never    Database administrator or Organizations: No    Attends Banker Meetings: Never    Marital Status: Married  Catering manager Violence: Patient Unable To Answer (01/05/2024)   Humiliation, Afraid, Rape, and Kick questionnaire    Fear of Current or Ex-Partner: Patient unable to answer    Emotionally Abused: Patient unable to answer    Physically Abused: Patient unable to answer    Sexually Abused: Patient unable to answer    Family History  Adopted: Yes     Current Outpatient Medications:    calcitRIOL  (ROCALTROL ) 0.25 MCG capsule, Take 0.25 mcg by mouth daily., Disp: , Rfl:    carvedilol  (COREG ) 3.125 MG tablet, TAKE 1 TABLET BY MOUTH 2 TIMES DAILY WITH A MEAL., Disp: 180 tablet, Rfl: 3   Cholecalciferol  (VITAMIN D -3) 25 MCG (1000 UT) CAPS, Take 1,000 Units by mouth daily., Disp: , Rfl:    clorazepate  (TRANXENE ) 7.5 MG tablet, TAKE 1 TABLET BY MOUTH TWICE A DAY AS NEEDED FOR ANXIETY, Disp: 60 tablet, Rfl: 0   cyanocobalamin (VITAMIN B12) 500 MCG tablet, Take 1,000 mcg by mouth daily., Disp: , Rfl:    EPINEPHrine  0.3 mg/0.3 mL IJ SOAJ injection, Inject 0.3 mg into the muscle as needed for anaphylaxis., Disp: 1 each, Rfl: 5   Ferrous Sulfate  (IRON PO), Take 1 tablet by mouth at bedtime., Disp: , Rfl:    folic acid  (FOLVITE ) 1 MG tablet, Take 1 mg by mouth daily., Disp: , Rfl:    furosemide  (LASIX ) 40 MG tablet, Take 40 mg by mouth daily., Disp: , Rfl:    gabapentin  (NEURONTIN ) 100 MG capsule, Take 2 capsules (200 mg total) by mouth 3 (three) times daily., Disp: 180 capsule, Rfl: 0   glucose blood (ONETOUCH VERIO) test strip, Use to check blood sugar once daily, Disp: 100 each, Rfl: 12   HYDROcodone -acetaminophen  (NORCO/VICODIN) 5-325 MG  tablet, Take 1 tablet by mouth every 6 (six) hours as needed. for pain, Disp: 60 tablet, Rfl: 0   hydrocortisone  2.5 % cream, Apply topically 3 (three) times daily as needed., Disp: 56 g, Rfl: 3   isosorbide  mononitrate (IMDUR ) 60 MG 24 hr tablet, Take 1 tablet (60 mg total) by mouth daily before lunch., Disp: 90 tablet,  Rfl: 3   latanoprost  (XALATAN ) 0.005 % ophthalmic solution, Place 1 drop into both eyes at bedtime., Disp: , Rfl:    MAGNESIUM  PO, Take 1 tablet by mouth daily., Disp: , Rfl:    melatonin 5 MG TABS, Take 1 tablet (5 mg total) by mouth at bedtime as needed., Disp: 30 tablet, Rfl: 0   pantoprazole  (PROTONIX ) 40 MG tablet, Take 1 tablet (40 mg total) by mouth 2 (two) times daily., Disp: 60 tablet, Rfl: 0   QUEtiapine  (SEROQUEL ) 50 MG tablet, Take 1 tablet (50 mg total) by mouth at bedtime., Disp: 30 tablet, Rfl: 0   rosuvastatin  (CRESTOR ) 10 MG tablet, TAKE ONE TABLET BY MOUTH EVERY DAY, Disp: 90 tablet, Rfl: 3  Physical exam:  Vitals:   01/04/24 1455  BP: (!) 149/79  Pulse: (!) 55  Resp: 18  Temp: 97.6 F (36.4 C)  TempSrc: Tympanic  SpO2: 100%  Weight: 230 lb (104.3 kg)   Physical Exam Constitutional:      Comments: Sitting in a wheelchair.  He is frail appearing  Cardiovascular:     Rate and Rhythm: Normal rate and regular rhythm.     Heart sounds: Normal heart sounds.  Pulmonary:     Effort: Pulmonary effort is normal.     Breath sounds: Normal breath sounds.  Abdominal:     General: Bowel sounds are normal.     Palpations: Abdomen is soft.  Skin:    General: Skin is warm and dry.  Neurological:     Mental Status: He is alert and oriented to person, place, and time.      I have personally reviewed labs listed below:    Latest Ref Rng & Units 01/06/2024    9:48 AM  CMP  Glucose 70 - 99 mg/dL 086   BUN 6 - 23 mg/dL 37   Creatinine 5.78 - 1.50 mg/dL 4.69   Sodium 629 - 528 mEq/L 141   Potassium 3.5 - 5.1 mEq/L 4.6   Chloride 96 - 112 mEq/L 105   CO2  19 - 32 mEq/L 28   Calcium  8.4 - 10.5 mg/dL 9.1       Latest Ref Rng & Units 01/06/2024    9:48 AM  CBC  WBC 4.0 - 10.5 K/uL 6.3   Hemoglobin 13.0 - 17.0 g/dL 8.7 Repeated and verified X2.   Hematocrit 39.0 - 52.0 % 27.0   Platelets 150.0 - 400.0 K/uL 154.0    I have personally reviewed Radiology images listed below: No images are attached to the encounter.  CT HEAD WO CONTRAST ( ) Result Date: 12/27/2023 CLINICAL DATA:  Altered mental status EXAM: CT HEAD WITHOUT CONTRAST TECHNIQUE: Contiguous axial images were obtained from the base of the skull through the vertex without intravenous contrast. RADIATION DOSE REDUCTION: This exam was performed according to the departmental dose-optimization program which includes automated exposure control, adjustment of the mA and/or kV according to patient size and/or use of iterative reconstruction technique. COMPARISON:  CT head 11/25/2023. FINDINGS: Brain: No acute intracranial hemorrhage. No CT evidence of acute infarct. Similar appearance of remote infarct in the right occipital lobe. Nonspecific hypoattenuation in the periventricular and subcortical white matter favored to reflect chronic microvascular ischemic changes. No edema, mass effect, or midline shift. The basilar cisterns are patent. Ventricles: The ventricles are normal. Vascular: Atherosclerotic calcifications of the carotid siphons and intracranial vertebral arteries. No hyperdense vessel. Skull: No acute or aggressive finding. Orbits: Right lens replacement.  Orbits otherwise unremarkable. Sinuses: Mucosal thickening  throughout the paranasal sinuses with near complete opacification of the visualized sinuses, increased from prior. Other: Trace fluid in the left mastoid tip. IMPRESSION: No CT evidence of acute intracranial abnormality. 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 Mild chronic microvascular ischemic changes. Paranasal sinus disease as above, increased from prior. Electronically  Signed   By: Denny Flack M.D.   On: 12/27/2023 15:19   DG Chest Portable 1 View Result Date: 12/27/2023 CLINICAL DATA:  Altered mental status EXAM: PORTABLE CHEST 1 VIEW COMPARISON:  10/26/2023 FINDINGS: Stable enlarged cardiac silhouette. Mildly prominent vasculature. Clear lungs with normal vascularity. Mild-to-moderate lower thoracic spine degenerative changes. Marked left glenohumeral degenerative changes and proximal right humerus postoperative changes with a fixation screw. IMPRESSION: Stable cardiomegaly with interval mild pulmonary vascular congestion. Electronically Signed   By: Catherin Closs M.D.   On: 12/27/2023 15:16   US  RENAL Result Date: 12/09/2023 CLINICAL DATA:  Acute renal failure. EXAM: RENAL / URINARY TRACT ULTRASOUND COMPLETE COMPARISON:  March 17, 2016. FINDINGS: Right Kidney: Renal measurements: 9.9 x 5.6 x 4.6 cm = volume: 134 mL. Increased echogenicity of renal parenchyma is noted. 3.6 cm cyst is noted. No mass or hydronephrosis visualized. Left Kidney: Renal measurements: 8.2 x 5.2 x 4.2 cm = volume: 94 mL. Increased echogenicity of renal parenchyma is noted. 2.2 cm simple cyst is noted. No mass or hydronephrosis visualized. Bladder: Appears normal for degree of bladder distention. Other: None. IMPRESSION: Increased echogenicity of renal parenchyma is noted suggesting medical renal disease. No hydronephrosis or renal obstruction is noted. Electronically Signed   By: Rosalene Colon M.D.   On: 12/09/2023 16:00     Assessment and plan- Patient is a 78 y.o. male here for posthospital discharge follow-up for anemia  When patient was admitted To the hospital in April 2025 he had significant pancytopenia with thrombocytopenia and neutropenia.  This has improved after stopping his methotrexate .  Patient had flow cytometry when he was in the hospital which showed polytypic plasma blast population representing 1% of leukocytes which can be seen in patients with autoimmune disorders viral  infections or IgG4 related disease.  This may indicate myeloproliferative disorder.  However given that his neutropenia and thrombocytopenia have resolved I am not planning to pursue a bone marrow biopsy just yet especially given patient's frailty.  Myeloma panel did not show any evidence of M protein and showed polyclonal increase in immunoglobulins.  Iron studies were indicative of anemia of chronic disease and not overtly suggestive of iron deficiency.  B12 levels were normal at 385.  I will be checking CBC ferritin and iron studies today and if iron saturation is more than 20% and ferritin levels are more than 100 I will treat him with EPO for anemia of chronic kidney disease.  Discussed risks and benefits of EPO including all but not limited to possible risk of thromboembolism.  Goal will be to keep his hematocrit between 10 and 11.  H&H and Retacrit once a month and I will see him back in 3 months with CBC ferritin and iron studies.  If there is no improvement in his anemia despite giving EPO I will consider getting a bone marrow biopsy at that time   Visit Diagnosis 1. Anemia of chronic disease   2. Anemia of chronic kidney failure, stage 4 (severe) (HCC)   3. Goals of care, counseling/discussion      Dr. Seretha Dance, MD, MPH [facility] [phone] 01/08/2024 10:59 AM

## 2024-01-09 ENCOUNTER — Other Ambulatory Visit: Payer: Self-pay

## 2024-01-09 DIAGNOSIS — D631 Anemia in chronic kidney disease: Secondary | ICD-10-CM

## 2024-01-09 NOTE — Progress Notes (Signed)
 Called patient to schedule no answer not able to leave voicemail- He has appointment tomorrow 6/3. Will try to catch him then.

## 2024-01-10 ENCOUNTER — Inpatient Hospital Stay

## 2024-01-10 ENCOUNTER — Inpatient Hospital Stay: Attending: Oncology

## 2024-01-10 VITALS — BP 121/60

## 2024-01-10 DIAGNOSIS — I129 Hypertensive chronic kidney disease with stage 1 through stage 4 chronic kidney disease, or unspecified chronic kidney disease: Secondary | ICD-10-CM | POA: Insufficient documentation

## 2024-01-10 DIAGNOSIS — N184 Chronic kidney disease, stage 4 (severe): Secondary | ICD-10-CM | POA: Insufficient documentation

## 2024-01-10 DIAGNOSIS — D631 Anemia in chronic kidney disease: Secondary | ICD-10-CM

## 2024-01-10 DIAGNOSIS — E1122 Type 2 diabetes mellitus with diabetic chronic kidney disease: Secondary | ICD-10-CM | POA: Insufficient documentation

## 2024-01-10 MED ORDER — EPOETIN ALFA-EPBX 40000 UNIT/ML IJ SOLN
40000.0000 [IU] | INTRAMUSCULAR | Status: DC
  Administered 2024-01-10: 40000 [IU] via SUBCUTANEOUS
  Filled 2024-01-10: qty 1

## 2024-01-11 DIAGNOSIS — B351 Tinea unguium: Secondary | ICD-10-CM | POA: Diagnosis not present

## 2024-01-11 DIAGNOSIS — E1142 Type 2 diabetes mellitus with diabetic polyneuropathy: Secondary | ICD-10-CM | POA: Diagnosis not present

## 2024-01-12 ENCOUNTER — Other Ambulatory Visit: Payer: Self-pay

## 2024-01-12 NOTE — Transitions of Care (Post Inpatient/ED Visit) (Signed)
 Transition of Care week 2  Visit Note  01/12/2024  Name: Daniel Kline MRN: [id number]          DOB: 12/02/1945  Situation: Patient enrolled in [facility] 30-day program. Visit completed with patient by telephone.   Background:   Initial Transition Care Management Follow-up Telephone Call Discharge Facility: [facility])  Past Medical History:  Diagnosis Date   Anemia    H/O   Anxiety    Arthritis    Atrial flutter (HCC)    a. s/p post ablation in 04/2017   Chronic kidney disease    Complication of anesthesia    CTCL (cutaneous T-cell lymphoma) (HCC)    Diabetes mellitus without complication (HCC)    Diastolic dysfunction    a. 03/2022 Echo: EF 60-65%, no rwma, GrI DD, nl RV fxn.   Dysplastic nevus 12/19/2017   Right distal lat. forearm near wrist. Severe atypia, close to peripheral margin.   Dysplastic nevus 06/21/2018   Upper back right paraspinal. Severe atypia, peripheral margin involved. Excised 07/11/2018, margins free.   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 forearm. SCCis, hypertrophic.    Assessment: Patient Reported Symptoms: Cognitive Cognitive Status: Alert and oriented to person, place, and time      Neurological Neurological Review of Symptoms: Other: [place] Neurological Symptoms/Conditions [RPT]: Chronic back pain and stiffness Neurological Management Strategies: Activity, Adequate rest Neurological Self-Management Outcome: 3 (uncertain) Neurological Comment:  fair  HEENT HEENT Symptoms Reported: No symptoms reported      Cardiovascular Cardiovascular Symptoms Reported: No symptoms reported Does patient have uncontrolled Hypertension?: Yes Is patient checking Blood Pressure at home?: Yes Patient's Recent BP reading at home: 150/71 Cardiovascular Conditions: Hypertension, High blood cholesterol, Heart failure Weight: 239 lb (108.4 kg) Cardiovascular Self-Management Outcome: 4 (good)  Respiratory Respiratory Symptoms Reported: Shortness of breath Other Respiratory Symptoms: Much better with oxygen Respiratory Conditions: Shortness of breath Respiratory Self-Management Outcome: 3 (uncertain) Respiratory Comment: much better  Endocrine Patient reports the following symptoms related to hypoglycemia or hyperglycemia : No symptoms reported Is patient diabetic?: Yes Is patient checking blood sugars at home?: Yes Endocrine Conditions: Diabetes  Gastrointestinal Gastrointestinal Symptoms Reported: No symptoms reported      Genitourinary Genitourinary Symptoms Reported: No symptoms reported    Integumentary Integumentary Symptoms Reported: Wound Additional Integumentary Details: cellulitis to both legs mainly Skin Conditions: Dermatitis Skin Self-Management Outcome: 3 (uncertain) Skin Comment: feel like it's better  Musculoskeletal Musculoskelatal Symptoms Reviewed: No symptoms reported Musculoskeletal Conditions: Unsteady gait Musculoskeletal Management Strategies: Activity Musculoskeletal Self-Management Outcome: 4 (good) Musculoskeletal Comment: uses his cane and exercise      Psychosocial Psychosocial Symptoms Reported: No symptoms reported Additional Psychological Details: Sleeping better Behavioral Health Conditions: Other Other Behavorial Health Conditions: no more hallucinations since on oxygen Behavioral Health Self-Management Outcome: 4 (good)       Vitals:   01/12/24 1505  BP: (!) 150/71    Medications  Reviewed Today      Reviewed by Jamie Mccoy, RN (Registered Nurse) on 01/12/24 at 1604  Med List Status: <None>   Medication Order Taking? Sig Documenting Provider Last Dose Status Informant  calcitRIOL  (ROCALTROL ) 0.25 MCG capsule [id number] No Take 0.25 mcg by mouth daily. [provider] Taking Active Pharmacy Records, Self  carvedilol  (COREG ) 3.125 MG tablet [id number] No TAKE 1 TABLET BY MOUTH 2 TIMES DAILY WITH A MEAL. Helaine Llanos, MD Taking Active Pharmacy Records, Self  Cholecalciferol  (VITAMIN D -3) 25 MCG (1000 UT) CAPS [id number] No Take 1,000 Units by mouth daily. [provider] Taking Active Pharmacy Records, Self  clorazepate  (TRANXENE ) 7.5 MG tablet [id number] No TAKE 1 TABLET BY MOUTH TWICE A DAY AS NEEDED FOR ANXIETY Helaine Llanos, MD Taking Active Self, Pharmacy Records           Med Note [place], Britta Candy Jan 05, 2024  9:20 AM) Take only as needed  cyanocobalamin (VITAMIN B12) 500 MCG tablet [id number] No Take 1,000 mcg by mouth daily. [provider] Taking Active Pharmacy Records, Self  EPINEPHrine  0.3 mg/0.3 mL IJ SOAJ injection [id number] No Inject 0.3 mg into the muscle as needed for anaphylaxis. Helaine Llanos, MD Taking Active Pharmacy Records, Self           Med Note [place], Britta Candy Jan 05, 2024  9:22 AM) Only takes as needed  Ferrous Sulfate  (IRON PO) [id number] No Take 1 tablet by mouth at bedtime. [provider] Taking Active Pharmacy Records, Self           Med Note Dede Fanny May 11, 2021  2:15 PM)    folic acid  (FOLVITE ) 1 MG tablet [id number] No Take 1 mg by mouth daily. [provider] Taking Active Self, Pharmacy Records  furosemide  (LASIX ) 40 MG tablet [id number] No Take 40 mg by mouth daily. [provider] Taking Active Self, Pharmacy Records           Med Note Miriam American, Rowan Cooter   Fri Dec 16, 2023  9:18 AM) Wife states started back on   gabapentin  (NEURONTIN ) 100 MG capsule [id number]  No Take 2 capsules (200 mg total) by mouth 3 (three) times daily. Verla Glaze, MD Taking Active   glucose blood [facility] VERIO) test strip [id number] No Use to check blood sugar once daily Helaine Llanos, MD Taking Active Self, Pharmacy Records  HYDROcodone -acetaminophen  (NORCO/VICODIN) 5-325 MG tablet [id number] No Take 1 tablet by mouth every 6 (six) hours as needed. for pain Helaine Llanos, MD Taking Active Self, Pharmacy Records           Med Note [place], Britta Candy Jan 05, 2024  9:23 AM) Taking only as needed  hydrocortisone  2.5 % cream [id number] No Apply topically 3 (three) times daily as needed. Helaine Llanos, MD Taking Active Pharmacy Records, Self           Med Note [facility], ELIZABETH A   Tue Dec 27, 2023  7:53 PM) PRN  isosorbide  mononitrate (IMDUR ) 60 MG 24 hr tablet [id number] No Take 1 tablet (60 mg total) by mouth daily before lunch. Gollan, Timothy J, MD Taking Active Pharmacy Records, Self  latanoprost  (XALATAN ) 0.005 % ophthalmic solution [id number] No Place 1 drop into both eyes at bedtime. [provider] Taking Active Pharmacy Records, Self  MAGNESIUM  PO [id number] No Take 1 tablet by mouth daily. [provider] Taking  Active Pharmacy Records, Self  melatonin 5 MG TABS [id number] No Take 1 tablet (5 mg total) by mouth at bedtime as needed. Verla Glaze, MD Taking Active            Med Note Miriam American, Britta Candy Jan 05, 2024  9:25 AM) Only PRN but has it if needed  pantoprazole  (PROTONIX ) 40 MG tablet [id number] No Take 1 tablet (40 mg total) by mouth 2 (two) times daily. Alphonsus Jeans, MD Taking Expired 01/11/24 2359 Self, Pharmacy Records  QUEtiapine  (SEROQUEL ) 50 MG tablet [id number] No Take 1 tablet (50 mg total) by mouth at bedtime. Verla Glaze, MD Taking Active   rosuvastatin  (CRESTOR ) 10 MG tablet [id number] No TAKE ONE TABLET BY MOUTH EVERY DAY Letvak, Richard I, MD Taking Active            **Patient states no  changes in medications except doctor took him off his diabetes medication patient states he got Reticrit Epoetin alfa 40000U/Ml injection on 01/10/23 for anemia every 28 days   Goals Addressed             This Visit's Progress    VBCI Transitions of Care (TOC) Care Plan   On track    Problems:  Recent Hospitalization for treatment of Noctural Hypoxia with hallucinations, Anemia/GIB, cellulitis RLE HX HF Knowledge Deficit Related to importance of oxygen at night as ordered and daily weights with HF; new regimen for anemia Updated on wearing oxygen when doing activity especially outside  Goal:  Over the next 30 days, the patient will not experience hospital readmission  Interventions:  Discussed with [name] weighing daily and checking Blood sugar and BP  Patient Self Care Activities:  Attend all scheduled provider appointments Call pharmacy for medication refills 3-7 days in advance of running out of medications Call provider office for new concerns or questions  Notify RN Care Manager of TOC call rescheduling needs Participate in Transition of Care Program/Attend TOC scheduled calls Patient to speak with oxygen provider regarding getting s smaller unit for outside of the home, activities  Plan:  Telephone follow up appointment with care management team member scheduled for:  01/19/2024 at 2:00 pm The care management team will reach out to the patient again over the next 7 to 10  days. Contact provider with questions regarding oxygen needs, patient also plan to follow up with possible interventions for his chronic back pain/herniated disc.         Brown [place], RN, BSN, CCM [facility], [facility] RN Care Manager Direct Dial: [phone]

## 2024-01-12 NOTE — Patient Instructions (Addendum)
 Visit Information  Thank you for taking time to visit with me today. Please don't hesitate to contact me if I can be of assistance to you before our next scheduled telephone appointment.  Our next appointment is by telephone on Dec 19, 2023 at 2:00 PM  Following is a copy of your care plan:   Goals Addressed             This Visit's Progress    VBCI Transitions of Care (TOC) Care Plan   On track    Problems:  Recent Hospitalization for treatment of Noctural Hypoxia with hallucinations, Anemia/GIB, cellulitis RLE HX HF Knowledge Deficit Related to importance of oxygen at night as ordered and daily weights with HF; new regimen for anemia Updated on wearing oxygen when doing activity especially outside  Goal:  Over the next 30 days, the patient will not experience hospital readmission  Interventions:  Discussed with [name] weighing daily and checking Blood sugar and BP  Patient Self Care Activities:  Attend all scheduled provider appointments Call pharmacy for medication refills 3-7 days in advance of running out of medications Call provider office for new concerns or questions  Notify RN Care Manager of TOC call rescheduling needs Participate in Transition of Care Program/Attend TOC scheduled calls Patient to speak with oxygen provider regarding getting s smaller unit for outside of the home, activities  Plan:  Telephone follow up appointment with care management team member scheduled for:  01/19/2024 at 2:00 pm The care management team will reach out to the patient again over the next 7 to 10  days. Contact provider with questions regarding oxygen needs, patient also plan to follow up with possible interventions for his chronic back pain/herniated disc.        The patient verbalized understanding of instructions, educational materials, and care plan provided today and DECLINED offer to receive copy of patient instructions, educational materials, and care plan.   The patient  has been provided with contact information for the care management team and has been advised to call with any health related questions or concerns.  The patient will call for any concerns and question regarding chronic back pain* as advised to speak with Dr,. Letvak.  Next PCP appointment scheduled for:  02/02/24 at 1530 Dr. Joelle Musca  Please call the care guide team at [phone] if you need to cancel or reschedule your appointment.   Please call the [place]  National Suicide Prevention Lifeline: (717)694-8357 or TTY: 937-436-5495 TTY [phone]) to talk to a trained counselor if you are experiencing a Mental Health or Behavioral Health Crisis or need someone to talk to.  Brown Cape, RN, BSN, CCM [facility], [facility] RN Care Manager Direct Dial: [phone]

## 2024-01-17 ENCOUNTER — Ambulatory Visit: Payer: Medicare Other | Admitting: Dermatology

## 2024-01-17 ENCOUNTER — Other Ambulatory Visit: Payer: Self-pay | Admitting: Internal Medicine

## 2024-01-17 ENCOUNTER — Ambulatory Visit: Payer: Self-pay

## 2024-01-17 NOTE — Telephone Encounter (Signed)
 Copied from CRM [id number]. Topic: Clinical - Red Word Triage >> Jan 17, 2024  2:56 PM [employer] wrote: Red Word that prompted transfer to Nurse Triage: places on both legs breaking out  fell in bathtub last night   Reason for Disposition  [1] Localized area of skin darkening or thickening on lower legs or ankles AND [2] has NOT been evaluated by a doctor (or NP/PA)    Patient has been seen for the redness before  Answer Assessment - Initial Assessment Questions 1. APPEARANCE of RASH: "Describe the rash."      Redness to bilateral legs with bumps 2. LOCATION: "Where is the rash located?"      Bilateral legs 3. NUMBER: "How many spots are there?"      Multiple 5. ONSET: "When did the rash start?"      2 days for lumps, redness is chronic  6. ITCHING: "Does the rash itch?" If Yes, ask: "How bad is the itch?"  (Scale 0-10; or none, mild, moderate, severe)     Mild  7. PAIN: "Does the rash hurt?" If Yes, ask: "How bad is the pain?"  (Scale 0-10; or none, mild, moderate, severe)    - NONE (0): no pain    - MILD (1-3): doesn't interfere with normal activities     - MODERATE (4-7): interferes with normal activities or awakens from sleep     - SEVERE (8-10): excruciating pain, unable to do any normal activities     Mild to moderate  8. OTHER SYMPTOMS: "Do you have any other symptoms?" (e.g., fever)     No  Protocols used: Rash or Redness - Localized-A-AH   FYI Only or Action Required?: FYI only for provider  Patient was last seen in primary care on 01/06/2024 by Helaine Llanos, MD. Called Nurse Triage reporting Rash. Symptoms began yesterday. Interventions attempted: Other: has applied cream to legs. Symptoms are: gradually worsening.  Triage Disposition: See PCP Within 2 Weeks  Patient/caregiver understands and will follow disposition?: Yes

## 2024-01-18 ENCOUNTER — Ambulatory Visit: Admitting: Nurse Practitioner

## 2024-01-18 VITALS — BP 138/80 | HR 70 | Temp 98.0°F | Ht 66.0 in | Wt 246.8 lb

## 2024-01-18 DIAGNOSIS — L03116 Cellulitis of left lower limb: Secondary | ICD-10-CM

## 2024-01-18 DIAGNOSIS — L03115 Cellulitis of right lower limb: Secondary | ICD-10-CM | POA: Diagnosis not present

## 2024-01-18 DIAGNOSIS — L97901 Non-pressure chronic ulcer of unspecified part of unspecified lower leg limited to breakdown of skin: Secondary | ICD-10-CM

## 2024-01-18 DIAGNOSIS — R296 Repeated falls: Secondary | ICD-10-CM | POA: Diagnosis not present

## 2024-01-18 LAB — CBC
HCT: 32 % — ABNORMAL LOW (ref 38.5–50.0)
Hemoglobin: 10.2 g/dL — ABNORMAL LOW (ref 13.2–17.1)
MCH: 31 pg (ref 27.0–33.0)
MCHC: 31.9 g/dL — ABNORMAL LOW (ref 32.0–36.0)
MCV: 97.3 fL (ref 80.0–100.0)
MPV: 10.6 fL (ref 7.5–12.5)
Platelets: 160 10*3/uL (ref 140–400)
RBC: 3.29 10*6/uL — ABNORMAL LOW (ref 4.20–5.80)
RDW: 14.8 % (ref 11.0–15.0)
WBC: 7.2 10*3/uL (ref 3.8–10.8)

## 2024-01-18 MED ORDER — DOXYCYCLINE HYCLATE 100 MG PO TABS: 100.0000 mg | ORAL_TABLET | Freq: Two times a day (BID) | ORAL | 0 refills | Status: AC

## 2024-01-18 MED ORDER — CEFTRIAXONE SODIUM 1 G IJ SOLR
1.0000 g | Freq: Once | INTRAMUSCULAR | Status: AC
  Administered 2024-01-18: 1 g via INTRAMUSCULAR

## 2024-01-18 NOTE — Assessment & Plan Note (Signed)
 1 gram rocephin  in office. Will start doxycycline  100mg  BID for 7 days. Unna boots applied in office

## 2024-01-18 NOTE — Assessment & Plan Note (Signed)
 1 gram rocephin  in office. Doxycycline  100mg  BID for 7 days.

## 2024-01-18 NOTE — Assessment & Plan Note (Signed)
 Hx of the same. Mechanical in nature. Amb referral for home health

## 2024-01-18 NOTE — Progress Notes (Signed)
 Acute Office Visit  Subjective:     Patient ID: Daniel Kline, male    DOB: 1945-08-28, 78 y.o.   MRN: [id number]  Chief Complaint  Patient presents with   redness and bumps    Pt complains of painful itchy bumps on lower legs for couple days. States they just popped up. Bumps are raised with pus (watery). Pt complains of having chills in the last few days.     HPI Patient is in today for rash/fall with a history of HTN, chronic venous insufficiency, CHF, OSA, chronic hypoxemia, DM type II, leg ulcer, CKD stage IV, hallucinations  States that he has had the bumps on the skin for awhile. States that over the last couple of days it really started breaking out or got more of them. States that he has had swelling ofr an extended period of time. States that he has been wearing compression socks. He wore then las night and then today. But beofre then he has not had then on for several weeks before then   Fall: state that he has been falling 3-4 times over the past 2 weeks. States that he is getting tripped up. No head injury. States that he fell 2 days ago in the tub. Statees that he was getting out of the bath tub. States that he got out of the tub. States that he slipped and went down  Review of Systems  Constitutional:  Positive for chills. Negative for fever.  Respiratory:  Negative for shortness of breath.   Cardiovascular:  Negative for chest pain.  Skin:  Positive for itching and rash.  Neurological:  Negative for headaches.  Psychiatric/Behavioral:  Negative for hallucinations and suicidal ideas.         Objective:    BP 138/80   Pulse 70   Temp 98 F (36.7 C) (Oral)   Ht 5' 6 (1.676 m)   Wt 246 lb 12.8 oz (111.9 kg)   SpO2 93%   BMI 39.83 kg/m  BP Readings from Last 3 Encounters:  01/18/24 138/80  01/12/24 (!) 150/71  01/10/24 121/60   Wt Readings from Last 3 Encounters:  01/18/24 246 lb 12.8 oz (111.9 kg)  01/12/24 239 lb (108.4 kg)  01/06/24 239 lb (108.4  kg)   SpO2 Readings from Last 3 Encounters:  01/18/24 93%  01/06/24 96%  01/04/24 100%      Physical Exam Vitals and nursing note reviewed.  Constitutional:      Appearance: Normal appearance.  Cardiovascular:     Rate and Rhythm: Normal rate and regular rhythm.     Pulses:          Dorsalis pedis pulses are 2+ on the right side and 2+ on the left side.     Heart sounds: Normal heart sounds.  Pulmonary:     Effort: Pulmonary effort is normal.     Breath sounds: Normal breath sounds.  Musculoskeletal:     Right lower leg: Edema present.     Left lower leg: Edema present.  Neurological:     Mental Status: He is alert.          No results found for any visits on 01/18/24.      Assessment & Plan:   Problem List Items Addressed This Visit       Musculoskeletal and Integument   Leg ulcer (HCC) - Primary   1 gram rocephin  in office. Will start doxycycline  100mg  BID for 7 days. Unna boots applied in  office         Other   Cellulitis of both lower extremities   1 gram rocephin  in office. Doxycycline  100mg  BID for 7 days.       Relevant Medications   cefTRIAXone  (ROCEPHIN ) injection 1 g   doxycycline  (VIBRA -TABS) 100 MG tablet   Other Relevant Orders   CBC   Multiple falls   Hx of the same. Mechanical in nature. Amb referral for home health       Relevant Orders   Ambulatory referral to Home Health    Meds ordered this encounter  Medications   cefTRIAXone  (ROCEPHIN ) injection 1 g   doxycycline  (VIBRA -TABS) 100 MG tablet    Sig: Take 1 tablet (100 mg total) by mouth 2 (two) times daily for 7 days.    Dispense:  14 tablet    Refill:  0    Supervising Provider:   Deri Fleet A [[id number]]    Return in about 2 days (around 01/20/2024) for wound recheck and unna boot wrapping .  Margarie Shay, NP

## 2024-01-18 NOTE — Telephone Encounter (Signed)
 Will evaluate in office

## 2024-01-18 NOTE — Patient Instructions (Signed)
 Nice to see you today I will be in touch with the labs  I have sent antibiotics for you to start taking tomorrow Follow up with me on Friday for a recheck and re warp

## 2024-01-19 ENCOUNTER — Emergency Department

## 2024-01-19 ENCOUNTER — Other Ambulatory Visit: Payer: Self-pay

## 2024-01-19 ENCOUNTER — Emergency Department
Admission: EM | Admit: 2024-01-19 | Discharge: 2024-01-19 | Disposition: A | Discharge status: Home / Self Care | Attending: Emergency Medicine | Admitting: Emergency Medicine

## 2024-01-19 ENCOUNTER — Ambulatory Visit: Payer: Self-pay | Admitting: Nurse Practitioner

## 2024-01-19 DIAGNOSIS — I129 Hypertensive chronic kidney disease with stage 1 through stage 4 chronic kidney disease, or unspecified chronic kidney disease: Secondary | ICD-10-CM | POA: Diagnosis not present

## 2024-01-19 DIAGNOSIS — E1122 Type 2 diabetes mellitus with diabetic chronic kidney disease: Secondary | ICD-10-CM | POA: Diagnosis not present

## 2024-01-19 DIAGNOSIS — N189 Chronic kidney disease, unspecified: Secondary | ICD-10-CM | POA: Insufficient documentation

## 2024-01-19 DIAGNOSIS — Y9301 Activity, walking, marching and hiking: Secondary | ICD-10-CM | POA: Insufficient documentation

## 2024-01-19 DIAGNOSIS — M19012 Primary osteoarthritis, left shoulder: Secondary | ICD-10-CM | POA: Diagnosis not present

## 2024-01-19 DIAGNOSIS — W19XXXA Unspecified fall, initial encounter: Secondary | ICD-10-CM | POA: Diagnosis not present

## 2024-01-19 DIAGNOSIS — R6889 Other general symptoms and signs: Secondary | ICD-10-CM | POA: Diagnosis not present

## 2024-01-19 DIAGNOSIS — S0121XA Laceration without foreign body of nose, initial encounter: Secondary | ICD-10-CM | POA: Insufficient documentation

## 2024-01-19 DIAGNOSIS — S0003XA Contusion of scalp, initial encounter: Secondary | ICD-10-CM | POA: Diagnosis not present

## 2024-01-19 DIAGNOSIS — Y92009 Unspecified place in unspecified non-institutional (private) residence as the place of occurrence of the external cause: Secondary | ICD-10-CM | POA: Diagnosis not present

## 2024-01-19 DIAGNOSIS — G8929 Other chronic pain: Secondary | ICD-10-CM | POA: Diagnosis not present

## 2024-01-19 DIAGNOSIS — M25512 Pain in left shoulder: Secondary | ICD-10-CM | POA: Insufficient documentation

## 2024-01-19 DIAGNOSIS — S199XXA Unspecified injury of neck, initial encounter: Secondary | ICD-10-CM | POA: Diagnosis not present

## 2024-01-19 DIAGNOSIS — M542 Cervicalgia: Secondary | ICD-10-CM | POA: Diagnosis not present

## 2024-01-19 DIAGNOSIS — W01198A Fall on same level from slipping, tripping and stumbling with subsequent striking against other object, initial encounter: Secondary | ICD-10-CM | POA: Insufficient documentation

## 2024-01-19 DIAGNOSIS — R519 Headache, unspecified: Secondary | ICD-10-CM | POA: Diagnosis present

## 2024-01-19 DIAGNOSIS — R220 Localized swelling, mass and lump, head: Secondary | ICD-10-CM | POA: Diagnosis not present

## 2024-01-19 DIAGNOSIS — G319 Degenerative disease of nervous system, unspecified: Secondary | ICD-10-CM | POA: Diagnosis not present

## 2024-01-19 DIAGNOSIS — I10 Essential (primary) hypertension: Secondary | ICD-10-CM | POA: Diagnosis not present

## 2024-01-19 NOTE — ED Provider Notes (Signed)
 [facility] Provider Note    Event Date/Time   First MD Initiated Contact with Patient 01/19/24 2130     (approximate)   History   Chief Complaint Fall and Laceration   HPI  Daniel Kline is a 78 y.o. male with past medical history of hypertension, hyperlipidemia, diabetes, CKD, and anemia who presents to the ED complaining of fall.  Patient reports that he was walking in his kitchen just prior to arrival when he slipped on a wet spot on the floor, falling forward and striking his face.  He did not lose consciousness and does not take a blood thinner.  He has noticed a laceration to the bridge of his nose and complains of some headache as well as neck pain.  He also complains of pain in his left shoulder, has some chronic pain there but it is worse after his fall.  He denies any pain in his chest, abdomen, or lower extremities.     Physical Exam   Triage Vital Signs: ED Triage Vitals [01/19/24 2133]  Encounter Vitals Group     BP      Girls Systolic BP Percentile      Girls Diastolic BP Percentile      Boys Systolic BP Percentile      Boys Diastolic BP Percentile      Pulse      Resp      Temp      Temp src      SpO2      Weight 244 lb (110.7 kg)     Height 5' 6 (1.676 m)     Head Circumference      Peak Flow      Pain Score 9     Pain Loc      Pain Education      Exclude from Growth Chart     Most recent vital signs: Vitals:   01/19/24 2141 01/19/24 2200  BP: (!) 165/83 (!) 153/67  Pulse: 74 69  Resp: 17 12  Temp: 98 F (36.7 C)   SpO2: 99% 96%    Constitutional: Alert and oriented. Eyes: Conjunctivae are normal. Head: Superficial laceration to the bridge of the nose. Nose: No congestion/rhinnorhea. Mouth/Throat: Mucous membranes are moist.  Neck: Midline cervical spine tenderness to palpation. Cardiovascular: Normal rate, regular rhythm. Grossly normal heart sounds.  2+ radial pulses bilaterally. Respiratory: Normal  respiratory effort.  No retractions. Lungs CTAB. Gastrointestinal: Soft and nontender. No distention. Musculoskeletal: No lower extremity tenderness nor edema.  Diffuse tenderness to palpation of the left shoulder with no obvious deformity.  No tenderness to palpation of the right upper extremity. Neurologic:  Normal speech and language. No gross focal neurologic deficits are appreciated.    ED Results / Procedures / Treatments   Labs (all labs ordered are listed, but only abnormal results are displayed) Labs Reviewed - No data to display   EKG  ED ECG REPORT I, Twilla Galea, the attending physician, personally viewed and interpreted this ECG.   Date: 01/19/2024  EKG Time: 21:45  Rate: 70  Rhythm: normal sinus rhythm  Axis: Normal  Intervals:none  ST&T Change: None  RADIOLOGY CT head reviewed and interpreted by me with no hemorrhage or midline shift.  PROCEDURES:  Critical Care performed: No  Procedures   MEDICATIONS ORDERED IN ED: Medications - No data to display   IMPRESSION / MDM / ASSESSMENT AND PLAN / ED COURSE  I reviewed the triage vital signs and  the nursing notes.                              78 y.o. male with past medical history of hypertension, hyperlipidemia, diabetes, CKD, and anemia who presents to the ED following slip and fall at home, striking his head.  Patient's presentation is most consistent with acute complicated illness / injury requiring diagnostic workup.  Differential diagnosis includes, but is not limited to, intracranial injury, cervical spine injury, facial fracture, laceration, shoulder fracture, dislocation.  Patient nontoxic-appearing and in no acute distress, vital signs are unremarkable.  He has a superficial laceration to the bridge of his nose that is not in need of repair.  CT head, cervical spine, and maxillofacial are negative for acute process.  X-ray of left shoulder is also unremarkable.  No evidence of traumatic injury  to his trunk or lower extremities.  Patient appropriate for discharge home with outpatient follow-up, was counseled to return to the ED for new or worsening symptoms.  Patient agrees with plan.      FINAL CLINICAL IMPRESSION(S) / ED DIAGNOSES   Final diagnoses:  Fall, initial encounter  Laceration of nose, initial encounter     Rx / DC Orders   ED Discharge Orders     None        Note:  This document was prepared using Dragon voice recognition software and may include unintentional dictation errors.   Twilla Galea, MD 01/19/24 [phone]

## 2024-01-19 NOTE — Patient Instructions (Signed)
 Visit Information  Thank you for taking time to visit with me today. Please don't hesitate to contact me if I can be of assistance to you before our next scheduled telephone appointment.  Our next appointment is by telephone will call after PCP appointment 01/20/24  Following is a copy of your care plan:   Goals Addressed             This Visit's Progress    V[facility] Transitions of Care (TOC) Care Plan       Problems:  Recent Hospitalization for treatment of Noctural Hypoxia with hallucinations, Anemia/GIB, cellulitis RLE HX HF Knowledge Deficit Related to importance of oxygen at night as ordered and daily weights with HF; new regimen for anemia Updated on wearing oxygen when doing activity especially outside  Goal:  Over the next 30 days, the patient will not experience hospital readmission  Interventions:  Discussed with [name] weighing daily and checking Blood sugar and BP  Patient Self Care Activities:  Attend all scheduled provider appointments Call pharmacy for medication refills 3-7 days in advance of running out of medications Call provider office for new concerns or questions  Notify RN Care Manager of TOC call rescheduling needs Participate in Transition of Care Program/Attend TOC scheduled calls Patient to speak with oxygen provider regarding getting s smaller unit for outside of the home, activities  Plan:  Telephone follow up appointment with care management team member scheduled for:   pm The care management team will reach out to the patient again over the next 7 to 10  days. Contact provider with questions regarding oxygen needs, patient also plan to follow up with possible interventions for his chronic back pain/herniated disc.        Will get a copy from appointment tomorrow 01/20/2024 per patient. Verbalizing understanding of information and ongoing plan of care  Telephone follow up appointment with care management team member scheduled for: The care  management team will reach out to the patient again over the next 7- 10  days.  Return to longitudinal care management RN for ongoing complex care coordination needs.  Please call the care guide team at [phone] if you need to cancel or reschedule your appointment.   Please call the [place]  National Suicide Prevention Lifeline: 908-248-2461 or TTY: (678)166-5839 TTY (743)169-6859) to talk to a trained counselor if you are experiencing a Mental Health or Behavioral Health Crisis or need someone to talk to.  [place], RN, BSN, CCM [facility], [facility] RN Care Manager Direct Dial: [phone]

## 2024-01-19 NOTE — ED Triage Notes (Signed)
 Patient C/O slipping on a wet floor at home. Patient states he hit his forehead and has a laceration on his nose. No LOC, no blood thinners. Patient has chronic pain in his left shoulder and left knee, though he says the fall made the shoulder and knee hurt worse.

## 2024-01-19 NOTE — Transitions of Care (Post Inpatient/ED Visit) (Signed)
 Transition of Care week 4  Visit Note  01/19/2024  Name: Daniel Kline MRN: [id number]          DOB: April 09, 1946  Situation: Patient enrolled in [facility] 30-day program. Visit completed with patient by telephone.   Background:   Initial Transition Care Management Follow-up Telephone Call    Past Medical History:  Diagnosis Date   Anemia    H/O   Anxiety    Arthritis    Atrial flutter (HCC)    a. s/p post ablation in 04/2017   Chronic kidney disease    Complication of anesthesia    CTCL (cutaneous T-cell lymphoma) (HCC)    Diabetes mellitus without complication (HCC)    Diastolic dysfunction    a. 03/2022 Echo: EF 60-65%, no rwma, GrI DD, nl RV fxn.   Dysplastic nevus 12/19/2017   Right distal lat. forearm near wrist. Severe atypia, close to peripheral margin.   Dysplastic nevus 06/21/2018   Upper back right paraspinal. Severe atypia, peripheral margin involved. Excised 07/11/2018, margins free.   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 forearm. SCCis, hypertrophic.    Assessment: Patient Reported Symptoms: Cognitive Cognitive Status: Alert and oriented to person, place, and time (denies hallucinations and confusion)      Neurological Neurological Review of Symptoms: Other: [place] Neurological Symptoms/Conditions [RPT]: Chronic back better, getting stronger Neurological Management Strategies: Activity, Adequate rest Neurological Self-Management Outcome: 4 (good)  HEENT HEENT Symptoms Reported: No  symptoms reported      Cardiovascular Cardiovascular Symptoms Reported: No symptoms reported Does patient have uncontrolled Hypertension?: Yes Is patient checking Blood Pressure at home?: Yes Patient's Recent BP reading at home: error the reading was blood sugar Cardiovascular Conditions: High blood cholesterol, Heart failure, Hypertension Weight: 245 lb (111.1 kg) Cardiovascular Self-Management Outcome: 3 (uncertain)  Respiratory Respiratory Symptoms Reported: No symptoms reported Respiratory Comment: much better  Endocrine Patient reports the following symptoms related to hypoglycemia or hyperglycemia : No symptoms reported Is patient diabetic?: Yes Is patient checking blood sugars at home?: Yes Endocrine Conditions: Diabetes Endocrine Management Strategies: Diet modification, Activity Endocrine Self-Management Outcome: 4 (good)  Gastrointestinal Gastrointestinal Symptoms Reported: No symptoms reported Gastrointestinal Self-Management Outcome: 4 (good)    Genitourinary Genitourinary Symptoms Reported: No symptoms reported    Integumentary Integumentary Symptoms Reported: Wound Additional Integumentary Details: UNNA boots at MD office 01/18/24 Skin Conditions: Dermatitis Skin Management Strategies: Medication therapy Skin Self-Management Outcome: 3 (uncertain) Skin Comment: to have checked 01/20/24 at PCP office  Musculoskeletal Musculoskelatal Symptoms Reviewed: No symptoms reported Additional Musculoskeletal Details: no changes Musculoskeletal Conditions: Unsteady gait Musculoskeletal Management Strategies: Activity, Medical device Musculoskeletal Self-Management Outcome: 4 (good) Musculoskeletal Comment: cane      Psychosocial Psychosocial Symptoms Reported: No symptoms reported Additional Psychological Details: Feels no anxiety or depression         There were no vitals filed for this visit.  Medications Reviewed Today     Reviewed by  Jamie Mccoy, RN  (Registered Nurse) on 01/19/24 at 1515  Med List Status: <None>   Medication Order Taking? Sig Documenting Provider Last Dose Status Informant  calcitRIOL  (ROCALTROL ) 0.25 MCG capsule [id number] No Take 0.25 mcg by mouth daily. [provider] Taking Active Pharmacy Records, Self  carvedilol  (COREG ) 3.125 MG tablet [id number] No TAKE 1 TABLET BY MOUTH 2 TIMES DAILY WITH A MEAL. Helaine Llanos, MD Taking Active Pharmacy Records, Self  Cholecalciferol  (VITAMIN D -3) 25 MCG (1000 UT) CAPS [id number] No Take 1,000 Units by mouth daily. [provider] Taking Active Pharmacy Records, Self  clorazepate  (TRANXENE ) 7.5 MG tablet [id number] No TAKE 1 TABLET BY MOUTH TWICE A DAY AS NEEDED FOR ANXIETY Helaine Llanos, MD Taking Active Self, Pharmacy Records           Med Note [place], Britta Candy Jan 05, 2024  9:20 AM) Take only as needed  cyanocobalamin (VITAMIN B12) 500 MCG tablet [id number] No Take 1,000 mcg by mouth daily. [provider] Taking Active Pharmacy Records, Self  doxycycline  (VIBRA -TABS) 100 MG tablet [id number]  Take 1 tablet (100 mg total) by mouth 2 (two) times daily for 7 days. Dorothe Gaster, NP  Active   EPINEPHrine  0.3 mg/0.3 mL IJ SOAJ injection [id number] No Inject 0.3 mg into the muscle as needed for anaphylaxis. Helaine Llanos, MD Taking Active Pharmacy Records, Self           Med Note [place], Britta Candy Jan 05, 2024  9:22 AM) Only takes as needed  Ferrous Sulfate  (IRON PO) [id number] No Take 1 tablet by mouth at bedtime. [provider] Taking Active Pharmacy Records, Self           Med Note Dede Fanny May 11, 2021  2:15 PM)    folic acid  (FOLVITE ) 1 MG tablet [id number] No Take 1 mg by mouth daily. [provider] Taking Active Self, Pharmacy Records  furosemide  (LASIX ) 40 MG tablet [id number] No Take 40 mg by mouth daily. [provider] Taking Active Self, Pharmacy Records           Med Note Miriam American,  Rowan Cooter   Fri Dec 16, 2023  9:18 AM) Wife states started back on   gabapentin  (NEURONTIN ) 100 MG capsule [id number] No Take 2 capsules (200 mg total) by mouth 3 (three) times daily. Verla Glaze, MD Taking Active   glucose blood [facility] VERIO) test strip [id number] No Use to check blood sugar once daily Helaine Llanos, MD Taking Active Self, Pharmacy Records  HYDROcodone -acetaminophen  (NORCO/VICODIN) 5-325 MG tablet [id number] No Take 1 tablet by mouth every 6 (six) hours as needed. for pain Helaine Llanos, MD Taking Active Self, Pharmacy Records           Med Note [place], Britta Candy Jan 05, 2024  9:23 AM) Taking only as needed  hydrocortisone  2.5 % cream [id number] No Apply topically 3 (three) times daily as needed. Helaine Llanos, MD Taking Active Pharmacy Records, Self           Med Note [facility], ELIZABETH A   Tue Dec 27, 2023  7:53 PM) PRN  isosorbide  mononitrate (IMDUR ) 60 MG 24 hr tablet [id number] No Take 1 tablet (60 mg total) by mouth daily before lunch. Gollan, Timothy J, MD Taking Active Pharmacy Records, Self  latanoprost  (XALATAN ) 0.005 % ophthalmic solution [id number] No Place 1 drop into both eyes at bedtime. [provider] Taking Active Pharmacy Records, Self  MAGNESIUM  PO [id number] No Take 1 tablet by mouth daily. [provider] Taking Active Pharmacy Records, Self  melatonin 5 MG TABS [id number] No Take 1 tablet (5 mg total) by mouth at bedtime as needed. Verla Glaze, MD Taking Active            Med Note Miriam American, Britta Candy Jan 05, 2024  9:25 AM) Only PRN but has it if needed  pantoprazole  (PROTONIX ) 40 MG tablet [id number] No Take 1 tablet (40 mg total) by mouth 2 (two) times daily. Alphonsus Jeans, MD Taking Expired 01/11/24 2359 Self, Pharmacy Records  QUEtiapine  (SEROQUEL ) 50 MG tablet [id number] No Take 1 tablet (50 mg total) by mouth at bedtime. Verla Glaze, MD Taking Active   rosuvastatin  (CRESTOR ) 10 MG tablet [id number]  No TAKE ONE TABLET BY MOUTH EVERY DAY Helaine Llanos, MD Taking Active            Follow up on wound/UNNA boots with Jolan Natal, NP at [facility] on 01/20/24 per patient and noted in EMR.  [place], RN, BSN, CCM [facility], [facility] RN Care Manager Direct Dial: [phone]

## 2024-01-20 ENCOUNTER — Ambulatory Visit (INDEPENDENT_AMBULATORY_CARE_PROVIDER_SITE_OTHER): Admitting: Nurse Practitioner

## 2024-01-20 ENCOUNTER — Other Ambulatory Visit: Payer: Self-pay | Admitting: Internal Medicine

## 2024-01-20 VITALS — BP 142/80 | HR 70 | Temp 97.9°F | Ht 66.0 in | Wt 245.6 lb

## 2024-01-20 DIAGNOSIS — I872 Venous insufficiency (chronic) (peripheral): Secondary | ICD-10-CM

## 2024-01-20 DIAGNOSIS — L03115 Cellulitis of right lower limb: Secondary | ICD-10-CM | POA: Diagnosis not present

## 2024-01-20 DIAGNOSIS — L03116 Cellulitis of left lower limb: Secondary | ICD-10-CM

## 2024-01-20 DIAGNOSIS — L97901 Non-pressure chronic ulcer of unspecified part of unspecified lower leg limited to breakdown of skin: Secondary | ICD-10-CM

## 2024-01-20 MED ORDER — CEPHALEXIN 500 MG PO CAPS
500.0000 mg | ORAL_CAPSULE | Freq: Two times a day (BID) | ORAL | 0 refills | Status: DC
Start: 2024-01-20 — End: 2024-02-29

## 2024-01-20 MED ORDER — CEPHALEXIN 500 MG PO CAPS: 500.0000 mg | ORAL_CAPSULE | Freq: Three times a day (TID) | ORAL | 0 refills | Status: DC

## 2024-01-20 MED ORDER — CEFTRIAXONE SODIUM 1 G IJ SOLR
1.0000 g | Freq: Once | INTRAMUSCULAR | Status: AC
  Administered 2024-01-20: 1 g via INTRAMUSCULAR

## 2024-01-20 NOTE — Telephone Encounter (Signed)
 Name of Medication: Hydrocodone  Name of Pharmacy: [facility] Last Fill or Written Date and Quantity: 12-15-23 #60 Last Office Visit and Type: 12-15-23 Next Office Visit and Type: 02-02-24 Last Controlled Substance Agreement Date: 01-27-23 Last UDS: 01-27-23

## 2024-01-20 NOTE — Assessment & Plan Note (Signed)
 Redness and warmth has increased.  Patient administered 1 g Rocephin  IM in office today will start Keflex  500 mg twice daily due to renal function continue doxycycline  100 mg twice daily close follow-up with PCP on Monday.  Patient was given signs and symptoms when to seek emergent health care over the weekend patient had great dorsal pedis and posterior tibial pulses

## 2024-01-20 NOTE — Assessment & Plan Note (Signed)
 History of the same.  Patient to keep legs elevated.  Will try Tubigrip wraps seen to make patient's legs worse we will discontinue

## 2024-01-20 NOTE — Assessment & Plan Note (Addendum)
 History of the same.  Patient was placed in Unna boots for short period of time but seem to make the legs worse.  Will discontinue we will cover ulcers with Vaseline impregnated gauze and wrap with Kerlix.  Patient given supplies to change daily he will have close follow-up with primary care on Monday.  Signs and symptoms reviewed when to seek urgent emergent health care

## 2024-01-20 NOTE — Patient Instructions (Signed)
 Nice to see you today I want you to try and change the dressing daily Follow up with Dr. Joelle Musca on Monday  If the chills increase, you feel sicker, pain/swelling/redness gets worse go to the hospital

## 2024-01-20 NOTE — Progress Notes (Signed)
 Acute Office Visit  Subjective:     Patient ID: Daniel Kline, male    DOB: November 19, 1945, 78 y.o.   MRN: [id number]  Chief Complaint  Patient presents with   Follow-up    HPI Patient is in today for wound follow up   Patient was seen by me on 01/18/2024 for ulcers of bilateral lower extremities, cellulitis, multiple falls.  Patient was given Rocephin  1 g IM and started on doxycycline  100 mg twice daily patient was placed in Unna boots and is here for follow-up. Of note patient was seen in the emergency department yesterday.  He was walking in his kitchen and slipped on a wet spot on the floor he fell forward and struck his face.  Did not lose consciousness is not on anticoagulation..  Had a small laceration to the bridge of the nose that did not require repair.  Did do a CT scan of head, cervical spine, maxillofacial that were negative.  X-ray of left shoulder was unremarkable.  Patient was discharged home.  When he saw me last in office we did discuss the multiple falls and I did order home PT to eval and treat patient \.  Review of Systems  Constitutional:  Positive for chills. Negative for fever.  Skin:  Positive for rash.        Objective:    BP (!) 142/80   Pulse 70   Temp 97.9 F (36.6 C) (Oral)   Ht 5' 6 (1.676 m)   Wt 245 lb 9.6 oz (111.4 kg)   SpO2 93%   BMI 39.64 kg/m  BP Readings from Last 3 Encounters:  01/20/24 (!) 142/80  01/19/24 135/69  01/18/24 138/80   Wt Readings from Last 3 Encounters:  01/20/24 245 lb 9.6 oz (111.4 kg)  01/19/24 244 lb (110.7 kg)  01/19/24 245 lb (111.1 kg)   SpO2 Readings from Last 3 Encounters:  01/20/24 93%  01/19/24 100%  01/18/24 93%      Physical Exam Vitals and nursing note reviewed.  Constitutional:      Appearance: Normal appearance.   Cardiovascular:     Rate and Rhythm: Normal rate and regular rhythm.     Pulses:          Dorsalis pedis pulses are 2+ on the right side and 2+ on the left side.        Posterior tibial pulses are 1+ on the right side and 1+ on the left side.     Heart sounds: Normal heart sounds.  Pulmonary:     Effort: Pulmonary effort is normal.     Breath sounds: Normal breath sounds.   Musculoskeletal:     Right lower leg: Edema present.     Left lower leg: Edema present.   Neurological:     Mental Status: He is alert.            No results found for any visits on 01/20/24.      Assessment & Plan:   Problem List Items Addressed This Visit       Cardiovascular and Mediastinum   Chronic venous insufficiency - Primary   History of the same.  Patient to keep legs elevated.  Will try Tubigrip wraps seen to make patient's legs worse we will discontinue        Musculoskeletal and Integument   Leg ulcer (HCC)   History of the same.  Patient was placed in Unna boots for short period of time but seem to make  the legs worse.  Will discontinue we will cover ulcers with Vaseline impregnated gauze and wrap with Kerlix.  Patient given supplies to change daily he will have close follow-up with primary care on Monday.  Signs and symptoms reviewed when to seek urgent emergent health care        Other   Cellulitis of both lower extremities   Redness and warmth has increased.  Patient administered 1 g Rocephin  IM in office today will start Keflex  500 mg twice daily due to renal function continue doxycycline  100 mg twice daily close follow-up with PCP on Monday.  Patient was given signs and symptoms when to seek emergent health care over the weekend patient had great dorsal pedis and posterior tibial pulses      Relevant Medications   cefTRIAXone  (ROCEPHIN ) injection 1 g   cephALEXin  (KEFLEX ) 500 MG capsule    Meds ordered this encounter  Medications   cefTRIAXone  (ROCEPHIN ) injection 1 g   DISCONTD: cephALEXin  (KEFLEX ) 500 MG capsule    Sig: Take 1 capsule (500 mg total) by mouth 3 (three) times daily.    Dispense:  21 capsule    Refill:  0     Supervising Provider:   Deri Fleet A [[id number]]   cephALEXin  (KEFLEX ) 500 MG capsule    Sig: Take 1 capsule (500 mg total) by mouth 2 (two) times daily.    Dispense:  14 capsule    Refill:  0    Please DC TID keflex  dose    Supervising Provider:   Deri Fleet A [[id number]]    Return follow up monday with Dr. letvak, for wound recheck .  Margarie Shay, NP

## 2024-01-23 ENCOUNTER — Ambulatory Visit (INDEPENDENT_AMBULATORY_CARE_PROVIDER_SITE_OTHER): Admitting: Internal Medicine

## 2024-01-23 ENCOUNTER — Encounter: Payer: Self-pay | Admitting: Internal Medicine

## 2024-01-23 VITALS — BP 138/80 | HR 60 | Temp 98.3°F | Resp 96 | Ht 66.0 in | Wt 246.0 lb

## 2024-01-23 DIAGNOSIS — L03115 Cellulitis of right lower limb: Secondary | ICD-10-CM

## 2024-01-23 DIAGNOSIS — L03116 Cellulitis of left lower limb: Secondary | ICD-10-CM

## 2024-01-23 DIAGNOSIS — I872 Venous insufficiency (chronic) (peripheral): Secondary | ICD-10-CM | POA: Diagnosis not present

## 2024-01-23 MED ORDER — FUROSEMIDE 80 MG PO TABS: 80.0000 mg | ORAL_TABLET | Freq: Every day | ORAL | 3 refills | Status: DC

## 2024-01-23 NOTE — Assessment & Plan Note (Signed)
 Will try increasing the furosemide  to 80mg  daily Refer to vascular for ongoing treatment

## 2024-01-23 NOTE — Progress Notes (Signed)
 Subjective:    Patient ID: Daniel Kline, male    DOB: [birth year], 78 y.o.   MRN: [id number]  HPI Here for follow up of leg swelling and likely cellulitis  Has had some falls One caused open area on nose and bruising under both eyes  Legs seem better Not as red Still swollen Tries to keep them elevated  Current Outpatient Medications on File Prior to Visit  Medication Sig Dispense Refill   calcitRIOL  (ROCALTROL ) 0.25 MCG capsule Take 0.25 mcg by mouth daily.     carvedilol  (COREG ) 3.125 MG tablet TAKE 1 TABLET BY MOUTH 2 TIMES DAILY WITH A MEAL. 180 tablet 3   cephALEXin  (KEFLEX ) 500 MG capsule Take 1 capsule (500 mg total) by mouth 2 (two) times daily. 14 capsule 0   Cholecalciferol  (VITAMIN D -3) 25 MCG (1000 UT) CAPS Take 1,000 Units by mouth daily.     clorazepate  (TRANXENE ) 7.5 MG tablet TAKE 1 TABLET BY MOUTH TWICE A DAY AS NEEDED FOR ANXIETY 60 tablet 0   cyanocobalamin  (VITAMIN B12) 500 MCG tablet Take 1,000 mcg by mouth daily.     doxycycline  (VIBRA -TABS) 100 MG tablet Take 1 tablet (100 mg total) by mouth 2 (two) times daily for 7 days. 14 tablet 0   EPINEPHrine  0.3 mg/0.3 mL IJ SOAJ injection Inject 0.3 mg into the muscle as needed for anaphylaxis. 1 each 5   Ferrous Sulfate  (IRON PO) Take 1 tablet by mouth at bedtime.     folic acid  (FOLVITE ) 1 MG tablet Take 1 mg by mouth daily.     furosemide  (LASIX ) 40 MG tablet Take 40 mg by mouth daily.     gabapentin  (NEURONTIN ) 100 MG capsule Take 2 capsules (200 mg total) by mouth 3 (three) times daily. 180 capsule 0   glucose blood (ONETOUCH VERIO) test strip Use to check blood sugar once daily 100 each 12   HYDROcodone -acetaminophen  (NORCO/VICODIN) 5-325 MG tablet TAKE ONE TABLET BY MOUTH EVERY 6 HOURS AS NEEDED FOR PAIN 60 tablet 0   hydrocortisone  2.5 % cream Apply topically 3 (three) times daily as needed. 56 g 3   isosorbide  mononitrate (IMDUR ) 60 MG 24 hr tablet Take 1 tablet (60 mg total) by mouth daily before lunch. 90  tablet 3   latanoprost  (XALATAN ) 0.005 % ophthalmic solution Place 1 drop into both eyes at bedtime.     MAGNESIUM  PO Take 1 tablet by mouth daily.     melatonin 5 MG TABS Take 1 tablet (5 mg total) by mouth at bedtime as needed. 30 tablet 0   pantoprazole  (PROTONIX ) 40 MG tablet Take 1 tablet (40 mg total) by mouth 2 (two) times daily. 60 tablet 0   QUEtiapine  (SEROQUEL ) 50 MG tablet Take 1 tablet (50 mg total) by mouth at bedtime. 30 tablet 0   rosuvastatin  (CRESTOR ) 10 MG tablet TAKE ONE TABLET BY MOUTH EVERY DAY 90 tablet 3   No current facility-administered medications on file prior to visit.    Allergies  Allergen Reactions   Bee Venom Anaphylaxis   Oxycodone  Other (See Comments)    Delusions   Hydromorphone  Other (See Comments)    hallucinating   Hydroxychloroquine     Other reaction(s): Other (See Comments) He broke out really badly.   Zolpidem Other (See Comments)    Past Medical History:  Diagnosis Date   Anemia    H/O   Anxiety    Arthritis    Atrial flutter (HCC)    a. s/p post  ablation in 04/2017   Chronic kidney disease    Complication of anesthesia    CTCL (cutaneous T-cell lymphoma) (HCC)    Diabetes mellitus without complication (HCC)    Diastolic dysfunction    a. 03/2022 Echo: EF 60-65%, no rwma, GrI DD, nl RV fxn.   Dysplastic nevus 12/19/2017   Right distal lat. forearm near wrist. Severe atypia, close to peripheral margin.   Dysplastic nevus 06/21/2018   Upper back right paraspinal. Severe atypia, peripheral margin involved. Excised 07/11/2018, margins free.   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 forearm. SCCis, hypertrophic.    Past Surgical History:  Procedure Laterality Date   BACK SURGERY     LUMBAR   CARDIAC ELECTROPHYSIOLOGY STUDY AND ABLATION  2019   CATARACT EXTRACTION W/PHACO Right 05/05/2022   Procedure: CATARACT EXTRACTION PHACO AND INTRAOCULAR LENS PLACEMENT (IOC) RIGHT;  Surgeon: Annell Kidney, MD;  Location: [facility];  Service: Ophthalmology;  Laterality: Right;  Diabetic 10.42 01:13.4   COLONOSCOPY WITH PROPOFOL  N/A 04/04/2019   Procedure: COLONOSCOPY WITH PROPOFOL ;  Surgeon: Toledo, Alphonsus Jeans, MD;  Location: [facility] ENDOSCOPY;  Service: Gastroenterology;  Laterality: N/A;   ESOPHAGOGASTRODUODENOSCOPY N/A 11/28/2023   Procedure: EGD (ESOPHAGOGASTRODUODENOSCOPY);  Surgeon: Marnee Sink, MD;  Location: [facility] ENDOSCOPY;  Service: Endoscopy;  Laterality: N/A;   I & D EXTREMITY Right 02/01/2020   Procedure: IRRIGATION AND DEBRIDEMENT EXTREMITY with poly exchange;  Surgeon: Arlyne Lame, MD;  Location: [facility] ORS;  Service: Orthopedics;  Laterality: Right;   INCISION AND DRAINAGE     BACK-MRSA INFECTION AFTER BACK SURGERY   KNEE ARTHROPLASTY Right 01/28/2020   Procedure: COMPUTER ASSISTED TOTAL KNEE ARTHROPLASTY;  Surgeon: Arlyne Lame, MD;  Location: [facility] ORS;  Service: Orthopedics;  Laterality: Right;   MOUTH SURGERY     right elbow surgery     right knee surgery     right shoulder surgery     from polio damage   TONSILLECTOMY     TOTAL HIP ARTHROPLASTY Bilateral 04/2016    Family History  Adopted: Yes    Social History   Socioeconomic History   Marital status: Married    Spouse name: Not on file   Number of children: 0   Years of education: Not on file   Highest education level: Not on file  Occupational History   Occupation: Corporate investment banker    Comment: 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   Occupation: Artist    Comment: Retired   Occupation: Scientist, research (medical)    Comment: Retired  Tobacco Use   Smoking status: Former    Types: Cigars    Quit date: 09/23/1990    Years since quitting: 33.3    Passive exposure: Past (as a child)   Smokeless tobacco: Former    Quit date: 02/25/2006  Vaping Use   Vaping status: Never Used  Substance and Sexual Activity   Alcohol use: No  Alcohol/week: 0.0 standard drinks of alcohol   Drug use: No   Sexual activity: Yes    Partners: Female  Other Topics Concern   Not on file  Social History Narrative   Has living will   Wife is health care POA---then brother or sister   Would accept resuscitation attempts but no prolonged ventilation or tube feeds   Social Drivers of Health   Financial Resource Strain: Low Risk  (07/20/2017)   Overall Financial Resource Strain (CARDIA)    Difficulty of Paying Living Expenses: Not hard at all  Food Insecurity: No Food Insecurity (01/05/2024)   Hunger Vital Sign    Worried About Running Out of Food in the Last Year: Never true    Ran Out of Food in the Last Year: Never true  Transportation Needs: No Transportation Needs (01/05/2024)   PRAPARE - Administrator, Civil Service (Medical): No    Lack of Transportation (Non-Medical): No  Physical Activity: Not on file  Stress: Not on file  Social Connections: Patient Unable To Answer (12/27/2023)   Social Connection and Isolation Panel    Frequency of Communication with Friends and Family: Patient unable to answer    Frequency of Social Gatherings with Friends and Family: Patient unable to answer    Attends Religious Services: Patient unable to answer    Active Member of Clubs or Organizations: Patient unable to answer    Attends Banker Meetings: Patient unable to answer    Marital Status: Patient unable to answer  Recent Concern: Social Connections - Moderately Isolated (11/26/2023)   Social Connection and Isolation Panel     Frequency of Communication with Friends and Family: More than three times a week    Frequency of Social Gatherings with Friends and Family: Once a week    Attends Religious Services: Never    Database administrator or Organizations: No    Attends Banker Meetings: Never    Marital Status: Married  Catering manager Violence: Patient Unable To Answer (01/05/2024)   Humiliation, Afraid, Rape, and Kick questionnaire    Fear of Current or Ex-Partner: Patient unable to answer    Emotionally Abused: Patient unable to answer    Physically Abused: Patient unable to answer    Sexually Abused: Patient unable to answer   Review of Systems No fever      Objective:   Physical Exam Constitutional:      Appearance: Normal appearance.   Musculoskeletal:     Comments: 2+ non pitting edema in legs   Skin:    Comments: Multiple superficial ulcers on both calves Redness has improved from last week--but still chronic hyperpigmentation (reddish)   Neurological:     Mental Status: He is alert.            Assessment & Plan:

## 2024-01-23 NOTE — Patient Instructions (Signed)
 Please increase the furosemide  to 80mg  daily ---to try to get rid of more of the fluid.

## 2024-01-23 NOTE — Assessment & Plan Note (Signed)
 Improved with the doxycycline  Will finish out the course [place] with compression dressing

## 2024-01-24 DIAGNOSIS — N2581 Secondary hyperparathyroidism of renal origin: Secondary | ICD-10-CM | POA: Diagnosis not present

## 2024-01-24 DIAGNOSIS — R809 Proteinuria, unspecified: Secondary | ICD-10-CM | POA: Diagnosis not present

## 2024-01-24 DIAGNOSIS — N184 Chronic kidney disease, stage 4 (severe): Secondary | ICD-10-CM | POA: Diagnosis not present

## 2024-01-24 DIAGNOSIS — I129 Hypertensive chronic kidney disease with stage 1 through stage 4 chronic kidney disease, or unspecified chronic kidney disease: Secondary | ICD-10-CM | POA: Diagnosis not present

## 2024-01-24 DIAGNOSIS — E1122 Type 2 diabetes mellitus with diabetic chronic kidney disease: Secondary | ICD-10-CM | POA: Diagnosis not present

## 2024-01-24 DIAGNOSIS — E785 Hyperlipidemia, unspecified: Secondary | ICD-10-CM | POA: Diagnosis not present

## 2024-01-24 DIAGNOSIS — D631 Anemia in chronic kidney disease: Secondary | ICD-10-CM | POA: Diagnosis not present

## 2024-01-26 ENCOUNTER — Encounter (INDEPENDENT_AMBULATORY_CARE_PROVIDER_SITE_OTHER): Payer: Self-pay | Admitting: Vascular Surgery

## 2024-01-26 ENCOUNTER — Ambulatory Visit (INDEPENDENT_AMBULATORY_CARE_PROVIDER_SITE_OTHER): Admitting: Vascular Surgery

## 2024-01-26 VITALS — BP 148/78 | HR 59 | Resp 16

## 2024-01-26 DIAGNOSIS — E1121 Type 2 diabetes mellitus with diabetic nephropathy: Secondary | ICD-10-CM

## 2024-01-26 DIAGNOSIS — I10 Essential (primary) hypertension: Secondary | ICD-10-CM

## 2024-01-26 DIAGNOSIS — I872 Venous insufficiency (chronic) (peripheral): Secondary | ICD-10-CM | POA: Diagnosis not present

## 2024-01-26 DIAGNOSIS — E7800 Pure hypercholesterolemia, unspecified: Secondary | ICD-10-CM | POA: Diagnosis not present

## 2024-01-30 ENCOUNTER — Encounter (INDEPENDENT_AMBULATORY_CARE_PROVIDER_SITE_OTHER): Payer: Self-pay | Admitting: Vascular Surgery

## 2024-01-30 NOTE — Progress Notes (Signed)
 Subjective:    Patient ID: Daniel Kline, male    DOB: [birth year], 78 y.o.   MRN: [id number] Chief Complaint  Patient presents with   Establish Care    New Patient Urgent Last seen 05/18/21 Sig venous ulceration on legs ref.Daniel Kline is a 78 yo male who presents to clinic today with chief complaint of bilateral lower extremity Swelling worsening over the past 3 months.  Patient endorses over the last 3 months his legs are swollen to where they become erythemic pink.  He has skin scaling with intermittent blisters and small scabs where he is scratching at his legs.  Edema today he endorses it is at its worst.  He denies trying any type of conservative therapy in the past.  He endorses that it can be very painful at times especially when he attempts to walk.    Review of Systems  Constitutional: Negative.   Cardiovascular:  Positive for leg swelling.  Musculoskeletal:  Positive for gait problem.  Skin:  Positive for color change.       Positive blisters  All other systems reviewed and are negative.      Objective:   Physical Exam Vitals reviewed.  Constitutional:      Appearance: Normal appearance. He is obese.  HENT:     Head: Normocephalic.   Eyes:     Pupils: Pupils are equal, round, and reactive to light.    Cardiovascular:     Rate and Rhythm: Normal rate and regular rhythm.     Pulses: Normal pulses.     Heart sounds: Normal heart sounds.  Pulmonary:     Effort: Pulmonary effort is normal.     Breath sounds: Normal breath sounds.  Abdominal:     General: There is distension.     Palpations: Abdomen is soft.   Musculoskeletal:        General: Swelling and tenderness present.     Right lower leg: Edema present.     Left lower leg: Edema present.   Skin:    General: Skin is warm and dry.     Capillary Refill: Capillary refill takes more than 3 seconds.   Neurological:     General: No focal deficit present.     Mental Status: He is alert  and oriented to person, place, and time. Mental status is at baseline.     Gait: Gait abnormal.   Psychiatric:        Mood and Affect: Mood normal.        Behavior: Behavior normal.        Thought Content: Thought content normal.        Judgment: Judgment normal.     BP (!) 148/78 (BP Location: Left Arm, Patient Position: Sitting, Cuff Size: Large)   Pulse (!) 59   Resp 16   Past Medical History:  Diagnosis Date   Anemia    H/O   Anxiety    Arthritis    Atrial flutter (HCC)    a. s/p post ablation in 04/2017   Chronic kidney disease    Complication of anesthesia    CTCL (cutaneous T-cell lymphoma) (HCC)    Diabetes mellitus without complication (HCC)    Diastolic dysfunction    a. 03/2022 Echo: EF 60-65%, no rwma, GrI DD, nl RV fxn.   Dysplastic nevus 12/19/2017   Right distal lat. forearm near wrist. Severe atypia, close to peripheral margin.   Dysplastic nevus 06/21/2018   Upper  back right paraspinal. Severe atypia, peripheral margin involved. Excised 07/11/2018, margins free.   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 forearm. SCCis, hypertrophic.    Social History   Socioeconomic History   Marital status: Married    Spouse name: Not on file   Number of children: 0   Years of education: Not on file   Highest education level: Not on file  Occupational History   Occupation: Corporate investment banker    Comment: 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   Occupation: Engineering geologist    Comment: Retired    Occupation: Scientist, research (medical)    Comment: Retired  Tobacco Use   Smoking status: Former    Types: Cigars    Quit date: 09/23/1990    Years since quitting: 33.3    Passive exposure: Past (as a child)   Smokeless tobacco: Former    Quit date: 02/25/2006  Vaping Use   Vaping status: Never Used  Substance and Sexual Activity   Alcohol use: No    Alcohol/week: 0.0 standard drinks of alcohol   Drug use: No   Sexual activity: Yes    Partners: Female  Other Topics Concern   Not on file  Social History Narrative   Has living will   Wife is health care POA---then brother or sister   Would accept resuscitation attempts but no prolonged ventilation or tube feeds   Social Drivers of Health   Financial Resource Strain: Low Risk  (07/20/2017)   Overall Financial Resource Strain (CARDIA)    Difficulty of Paying Living Expenses: Not hard at all  Food Insecurity: No Food Insecurity (01/05/2024)   Hunger Vital Sign    Worried About Running Out of Food in the Last Year: Never true    Ran Out of Food in the Last Year: Never true  Transportation Needs: No Transportation Needs (01/05/2024)   PRAPARE - Administrator, Civil Service (Medical): No    Lack of Transportation (Non-Medical): No  Physical Activity: Not on file  Stress: Not on file  Social Connections: Patient Unable To Answer (12/27/2023)   Social Connection and Isolation Panel    Frequency of Communication with Friends and Family: Patient unable to answer    Frequency of Social Gatherings with Friends and Family: Patient unable to answer    Attends Religious Services: Patient unable to answer    Active Member of Clubs or Organizations: Patient unable to answer    Attends Banker Meetings: Patient unable to answer    Marital Status: Patient unable to answer  Recent Concern: Social Connections - Moderately Isolated (11/26/2023)   Social Connection and Isolation Panel    Frequency of Communication  with Friends and Family: More than three times a week    Frequency of Social Gatherings with Friends and Family: Once a week    Attends Religious Services: Never    Database administrator or Organizations: No  Attends Banker Meetings: Never    Marital Status: Married  Catering manager Violence: Patient Unable To Answer (01/05/2024)   Humiliation, Afraid, Rape, and Kick questionnaire    Fear of Current or Ex-Partner: Patient unable to answer    Emotionally Abused: Patient unable to answer    Physically Abused: Patient unable to answer    Sexually Abused: Patient unable to answer    Past Surgical History:  Procedure Laterality Date   BACK SURGERY     LUMBAR   CARDIAC ELECTROPHYSIOLOGY STUDY AND ABLATION  2019   CATARACT EXTRACTION W/PHACO Right 05/05/2022   Procedure: CATARACT EXTRACTION PHACO AND INTRAOCULAR LENS PLACEMENT (IOC) RIGHT;  Surgeon: Mittie Gaskin, MD;  Location: [facility];  Service: Ophthalmology;  Laterality: Right;  Diabetic 10.42 01:13.4   COLONOSCOPY WITH PROPOFOL  N/A 04/04/2019   Procedure: COLONOSCOPY WITH PROPOFOL ;  Surgeon: Toledo, Ladell POUR, MD;  Location: [facility] ENDOSCOPY;  Service: Gastroenterology;  Laterality: N/A;   ESOPHAGOGASTRODUODENOSCOPY N/A 11/28/2023   Procedure: EGD (ESOPHAGOGASTRODUODENOSCOPY);  Surgeon: Jinny Carmine, MD;  Location: [facility] ENDOSCOPY;  Service: Endoscopy;  Laterality: N/A;   I & D EXTREMITY Right 02/01/2020   Procedure: IRRIGATION AND DEBRIDEMENT EXTREMITY with poly exchange;  Surgeon: Mardee Lynwood SQUIBB, MD;  Location: [facility] ORS;  Service: Orthopedics;  Laterality: Right;   INCISION AND DRAINAGE     BACK-MRSA INFECTION AFTER BACK SURGERY   KNEE ARTHROPLASTY Right 01/28/2020   Procedure: COMPUTER ASSISTED TOTAL KNEE ARTHROPLASTY;  Surgeon: Mardee Lynwood SQUIBB, MD;  Location: [facility] ORS;  Service: Orthopedics;  Laterality: Right;   MOUTH SURGERY     right elbow surgery     right knee surgery     right shoulder surgery      from polio damage   TONSILLECTOMY     TOTAL HIP ARTHROPLASTY Bilateral 04/2016    Family History  Adopted: Yes    Allergies  Allergen Reactions   Bee Venom Anaphylaxis   Oxycodone  Other (See Comments)    Delusions   Hydromorphone  Other (See Comments)    hallucinating   Hydroxychloroquine     Other reaction(s): Other (See Comments) He broke out really badly.   Zolpidem Other (See Comments)       Latest Ref Rng & Units 01/18/2024    4:38 PM 01/06/2024    9:48 AM 01/04/2024    3:15 PM  CBC  WBC 3.8 - 10.8 Thousand/uL 7.2  6.3  6.2   Hemoglobin 13.2 - 17.1 g/dL 89.7  8.7 Repeated and verified X2.  8.8   Hematocrit 38.5 - 50.0 % 32.0  27.0  27.9   Platelets 140 - 400 Thousand/uL 160  154.0  145       CMP     Component Value Date/Time   NA 141 01/06/2024 0948   NA 141 10/28/2011 0325   K 4.6 01/06/2024 0948   K 3.9 10/28/2011 0325   CL 105 01/06/2024 0948   CL 106 10/28/2011 0325   CO2 28 01/06/2024 0948   CO2 25 10/28/2011 0325   GLUCOSE 155 (H) 01/06/2024 0948   GLUCOSE 121 (H) 10/28/2011 0325   BUN 37 (H) 01/06/2024 0948   BUN 22 (H) 10/28/2011 0325   CREATININE 2.46 (H) 01/06/2024 0948   CREATININE 1.27 (H) 02/09/2016 1421   CALCIUM  9.1 01/06/2024 0948   CALCIUM  7.9 (L) 10/28/2011 0325   PROT 6.6 12/27/2023 1127   ALBUMIN 3.3 (L) 01/06/2024 0948   AST 10 (L) 12/27/2023 1127   ALT 10 12/27/2023 1127  ALKPHOS 59 12/27/2023 1127   BILITOT 0.5 12/27/2023 1127   GFR 24.34 (L) 01/06/2024 0948   GFRNONAA 18 (L) 12/31/2023 0741   GFRNONAA 56 (L) 02/09/2016 1421     No results found.     Assessment & Plan:   1. Chronic venous insufficiency (Primary) Patient presents today with +3 to +4 bilateral lower extremity edema due to vascular insufficiency.  Patient's skin is red and warm with blisters and scaling.  Swelling is below the knees that continues through the toes.  Plan is to wrap the patient's bilateral lower extremities in Unna boots for the next  4 weeks to help reduce swelling.  He will follow-up with a provider at week 4 to see how he is progressing.  2. Essential hypertension Continue antihypertensive medications as already ordered, these medications have been reviewed and there are no changes at this time.  3. Type 2 diabetes mellitus with diabetic nephropathy, without long-term current use of insulin  (HCC) Continue hypoglycemic medications as already ordered, these medications have been reviewed and there are no changes at this time.  Hgb A1C to be monitored as already arranged by primary service  4. Hypercholesterolemia Continue statin as ordered and reviewed, no changes at this time  5. Morbid obesity (HCC) Discussed with patient's for more than 10 minutes his need to lose weight we ordered p.o. will to assist with decreasing his bilateral lower extremity venous insufficiency.   Current Outpatient Medications on File Prior to Visit  Medication Sig Dispense Refill   calcitRIOL  (ROCALTROL ) 0.25 MCG capsule Take 0.25 mcg by mouth daily.     carvedilol  (COREG ) 3.125 MG tablet TAKE 1 TABLET BY MOUTH 2 TIMES DAILY WITH A MEAL. 180 tablet 3   cephALEXin  (KEFLEX ) 500 MG capsule Take 1 capsule (500 mg total) by mouth 2 (two) times daily. 14 capsule 0   Cholecalciferol  (VITAMIN D -3) 25 MCG (1000 UT) CAPS Take 1,000 Units by mouth daily.     clorazepate  (TRANXENE ) 7.5 MG tablet TAKE 1 TABLET BY MOUTH TWICE A DAY AS NEEDED FOR ANXIETY 60 tablet 0   cyanocobalamin  (VITAMIN B12) 500 MCG tablet Take 1,000 mcg by mouth daily.     EPINEPHrine  0.3 mg/0.3 mL IJ SOAJ injection Inject 0.3 mg into the muscle as needed for anaphylaxis. 1 each 5   Ferrous Sulfate  (IRON PO) Take 1 tablet by mouth at bedtime.     folic acid  (FOLVITE ) 1 MG tablet Take 1 mg by mouth daily.     furosemide  (LASIX ) 80 MG tablet Take 1 tablet (80 mg total) by mouth daily. 30 tablet 3   gabapentin  (NEURONTIN ) 100 MG capsule Take 2 capsules (200 mg total) by mouth 3  (three) times daily. 180 capsule 0   glucose blood (ONETOUCH VERIO) test strip Use to check blood sugar once daily 100 each 12   HYDROcodone -acetaminophen  (NORCO/VICODIN) 5-325 MG tablet TAKE ONE TABLET BY MOUTH EVERY 6 HOURS AS NEEDED FOR PAIN 60 tablet 0   hydrocortisone  2.5 % cream Apply topically 3 (three) times daily as needed. 56 g 3   isosorbide  mononitrate (IMDUR ) 60 MG 24 hr tablet Take 1 tablet (60 mg total) by mouth daily before lunch. 90 tablet 3   latanoprost  (XALATAN ) 0.005 % ophthalmic solution Place 1 drop into both eyes at bedtime.     MAGNESIUM  PO Take 1 tablet by mouth daily.     melatonin 5 MG TABS Take 1 tablet (5 mg total) by mouth at bedtime as needed. 30 tablet 0  pantoprazole  (PROTONIX ) 40 MG tablet Take 1 tablet (40 mg total) by mouth 2 (two) times daily. 60 tablet 0   QUEtiapine  (SEROQUEL ) 50 MG tablet Take 1 tablet (50 mg total) by mouth at bedtime. 30 tablet 0   rosuvastatin  (CRESTOR ) 10 MG tablet TAKE ONE TABLET BY MOUTH EVERY DAY 90 tablet 3   No current facility-administered medications on file prior to visit.    There are no Patient Instructions on file for this visit. No follow-ups on file.   Gwendlyn JONELLE Shank, NP

## 2024-01-31 ENCOUNTER — Ambulatory Visit: Payer: Self-pay

## 2024-01-31 ENCOUNTER — Telehealth: Payer: Self-pay

## 2024-01-31 DIAGNOSIS — L97818 Non-pressure chronic ulcer of other part of right lower leg with other specified severity: Secondary | ICD-10-CM | POA: Diagnosis not present

## 2024-01-31 DIAGNOSIS — I872 Venous insufficiency (chronic) (peripheral): Secondary | ICD-10-CM | POA: Diagnosis not present

## 2024-01-31 DIAGNOSIS — I130 Hypertensive heart and chronic kidney disease with heart failure and stage 1 through stage 4 chronic kidney disease, or unspecified chronic kidney disease: Secondary | ICD-10-CM | POA: Diagnosis not present

## 2024-01-31 DIAGNOSIS — L97828 Non-pressure chronic ulcer of other part of left lower leg with other specified severity: Secondary | ICD-10-CM | POA: Diagnosis not present

## 2024-01-31 DIAGNOSIS — E1122 Type 2 diabetes mellitus with diabetic chronic kidney disease: Secondary | ICD-10-CM | POA: Diagnosis not present

## 2024-01-31 DIAGNOSIS — I509 Heart failure, unspecified: Secondary | ICD-10-CM | POA: Diagnosis not present

## 2024-01-31 NOTE — Telephone Encounter (Signed)
 See other message about this
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
## 2024-02-01 ENCOUNTER — Encounter (INDEPENDENT_AMBULATORY_CARE_PROVIDER_SITE_OTHER): Payer: Self-pay | Admitting: Nurse Practitioner

## 2024-02-01 ENCOUNTER — Ambulatory Visit (INDEPENDENT_AMBULATORY_CARE_PROVIDER_SITE_OTHER)

## 2024-02-01 VITALS — BP 122/70 | HR 56 | Resp 16

## 2024-02-01 DIAGNOSIS — I872 Venous insufficiency (chronic) (peripheral): Secondary | ICD-10-CM | POA: Diagnosis not present

## 2024-02-01 NOTE — Progress Notes (Signed)
 History of Present Illness  There is no documented history at this time  Assessments & Plan   There are no diagnoses linked to this encounter.    Additional instructions  Subjective:  Patient presents with venous ulcer of the Bilateral lower extremity.    Procedure:  3 layer unna wrap was placed Bilateral lower extremity.   Plan:   Follow up in one week.

## 2024-02-01 NOTE — Telephone Encounter (Signed)
 Left message on verified VM for [facility] with Dr Marval note about the vascular specialists taking care of his legs.

## 2024-02-02 ENCOUNTER — Ambulatory Visit (INDEPENDENT_AMBULATORY_CARE_PROVIDER_SITE_OTHER): Admitting: Internal Medicine

## 2024-02-02 ENCOUNTER — Encounter: Payer: Self-pay | Admitting: Internal Medicine

## 2024-02-02 VITALS — BP 136/80 | HR 73 | Temp 97.7°F | Ht 66.0 in | Wt 233.0 lb

## 2024-02-02 DIAGNOSIS — I872 Venous insufficiency (chronic) (peripheral): Secondary | ICD-10-CM | POA: Diagnosis not present

## 2024-02-02 DIAGNOSIS — F39 Unspecified mood [affective] disorder: Secondary | ICD-10-CM | POA: Diagnosis not present
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
## 2024-02-02 NOTE — Assessment & Plan Note (Signed)
 Has diuresed better with the increased furosemide  80mg  daily Will check renal profile

## 2024-02-03 ENCOUNTER — Other Ambulatory Visit: Payer: Self-pay

## 2024-02-03 ENCOUNTER — Ambulatory Visit: Payer: Self-pay

## 2024-02-03 ENCOUNTER — Telehealth: Payer: Self-pay

## 2024-02-03 ENCOUNTER — Ambulatory Visit: Payer: Self-pay | Admitting: Internal Medicine

## 2024-02-03 DIAGNOSIS — N184 Chronic kidney disease, stage 4 (severe): Secondary | ICD-10-CM

## 2024-02-03 DIAGNOSIS — I509 Heart failure, unspecified: Secondary | ICD-10-CM | POA: Diagnosis not present

## 2024-02-03 DIAGNOSIS — N189 Chronic kidney disease, unspecified: Secondary | ICD-10-CM

## 2024-02-03 DIAGNOSIS — I130 Hypertensive heart and chronic kidney disease with heart failure and stage 1 through stage 4 chronic kidney disease, or unspecified chronic kidney disease: Secondary | ICD-10-CM | POA: Diagnosis not present

## 2024-02-03 DIAGNOSIS — D631 Anemia in chronic kidney disease: Secondary | ICD-10-CM

## 2024-02-03 DIAGNOSIS — I872 Venous insufficiency (chronic) (peripheral): Secondary | ICD-10-CM | POA: Diagnosis not present

## 2024-02-03 DIAGNOSIS — N1832 Chronic kidney disease, stage 3b: Secondary | ICD-10-CM

## 2024-02-03 DIAGNOSIS — L97818 Non-pressure chronic ulcer of other part of right lower leg with other specified severity: Secondary | ICD-10-CM | POA: Diagnosis not present

## 2024-02-03 DIAGNOSIS — E1122 Type 2 diabetes mellitus with diabetic chronic kidney disease: Secondary | ICD-10-CM | POA: Diagnosis not present

## 2024-02-03 DIAGNOSIS — L97828 Non-pressure chronic ulcer of other part of left lower leg with other specified severity: Secondary | ICD-10-CM | POA: Diagnosis not present

## 2024-02-03 DIAGNOSIS — R4182 Altered mental status, unspecified: Secondary | ICD-10-CM

## 2024-02-03 LAB — RENAL FUNCTION PANEL
Albumin: 3.9 g/dL (ref 3.5–5.2)
BUN: 47 mg/dL — ABNORMAL HIGH (ref 6–23)
CO2: 22 meq/L (ref 19–32)
Calcium: 9.6 mg/dL (ref 8.4–10.5)
Chloride: 104 meq/L (ref 96–112)
Creatinine, Ser: 4.08 mg/dL — ABNORMAL HIGH (ref 0.40–1.50)
GFR: 13.25 mL/min — CL (ref >60.00)
Glucose, Bld: 93 mg/dL (ref 70–99)
Phosphorus: 5.6 mg/dL — ABNORMAL HIGH (ref 2.3–4.6)
Potassium: 4.4 meq/L (ref 3.5–5.1)
Sodium: 137 meq/L (ref 135–145)

## 2024-02-03 NOTE — Transitions of Care (Post Inpatient/ED Visit) (Signed)
  Transition of Care Final/Transition  Visit Note  02/03/2024  Name: Daniel Kline MRN: [id number]          DOB: 10-19-1945  Situation: Patient enrolled in [facility] 30-day program. Follow up for transition to CCM  Background:   Initial Transition Care Management Follow-up Telephone Call    Past Medical History:  Diagnosis Date   Anemia    H/O   Anxiety    Arthritis    Atrial flutter (HCC)    a. s/p post ablation in 04/2017   Chronic kidney disease    Complication of anesthesia    CTCL (cutaneous T-cell lymphoma) (HCC)    Diabetes mellitus without complication (HCC)    Diastolic dysfunction    a. 03/2022 Echo: EF 60-65%, no rwma, GrI DD, nl RV fxn.   Dysplastic nevus 12/19/2017   Right distal lat. forearm near wrist. Severe atypia, close to peripheral margin.   Dysplastic nevus 06/21/2018   Upper back right paraspinal. Severe atypia, peripheral margin involved. Excised 07/11/2018, margins free.   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 forearm. SCCis, hypertrophic.    Patient wanted to transition back to [facility] CCM team.  Referral made today.  Richerd Fish, RN, BSN, CCM [name] RN Care Manager Direct Dial: [phone]

## 2024-02-03 NOTE — Telephone Encounter (Signed)
 Yes we can check his bmp when he comes here

## 2024-02-03 NOTE — Telephone Encounter (Signed)
 Spoke to pt's wife per dpr. He has an appt next week at the [facility] for his injection. It is hard for the 2 of them to get out and go places. Will see if Dr Melanee would put in orders so his kidney functions and GFR can be checked while he is there having that done.

## 2024-02-03 NOTE — Telephone Encounter (Signed)
 Given the decrease in GFR decrease the lasix  back down to 40mg  daily. I will let his nephrologist know. We can have him come back in a week (Wednesday or Thursday) to see if there is a rebound in his kidney function

## 2024-02-03 NOTE — Telephone Encounter (Signed)
 Spoke to pt's wife per DPR to let her know Dr Melanee agreed to order the lab so it can be drawn next week at his appt.

## 2024-02-03 NOTE — Telephone Encounter (Signed)
 Noted

## 2024-02-03 NOTE — Telephone Encounter (Signed)
  CRITICAL VALUE:GFR 13.25  RECEIVER (on-site recipient of call):Octa Uplinger LPN  DATE & TIME NOTIFIED: 12:09 PM 02/03/24   MESSENGER (representative from lab):Saa  MD NOTIFIED: Lynwood Crandall, NP  Dr Jimmy out of office  TIME OF NOTIFICATION:12:09 PM  RESPONSE:

## 2024-02-06 ENCOUNTER — Telehealth: Payer: Self-pay

## 2024-02-06 ENCOUNTER — Ambulatory Visit: Admitting: Internal Medicine

## 2024-02-06 ENCOUNTER — Other Ambulatory Visit: Payer: Self-pay

## 2024-02-06 DIAGNOSIS — D631 Anemia in chronic kidney disease: Secondary | ICD-10-CM

## 2024-02-06 NOTE — Telephone Encounter (Signed)
 We are aware of that. The physical therapist from [name] pushed for the referral for skilled nursing for his legs because pt said he had changed them. I would assume the pt did not tell him he was seeing vascular.

## 2024-02-06 NOTE — Telephone Encounter (Signed)
 Copied from CRM [id number]. Topic: Referral - Question >> Feb 06, 2024  8:52 AM Berneda FALCON wrote: Reason for CRM: Margo from [facility] states we sent a referral to [facility] for evaluation for lower extrememities but pt is being [facility] and [facility] already so they do not need to treat him at [facility]. States he went to vascular after the referral was sent over to them.  Gwynneth from [name]

## 2024-02-07 ENCOUNTER — Inpatient Hospital Stay: Attending: Oncology

## 2024-02-07 ENCOUNTER — Other Ambulatory Visit: Payer: Self-pay

## 2024-02-07 ENCOUNTER — Telehealth: Payer: Self-pay

## 2024-02-07 ENCOUNTER — Inpatient Hospital Stay

## 2024-02-07 DIAGNOSIS — I872 Venous insufficiency (chronic) (peripheral): Secondary | ICD-10-CM | POA: Diagnosis not present

## 2024-02-07 DIAGNOSIS — E1122 Type 2 diabetes mellitus with diabetic chronic kidney disease: Secondary | ICD-10-CM | POA: Diagnosis not present

## 2024-02-07 DIAGNOSIS — I130 Hypertensive heart and chronic kidney disease with heart failure and stage 1 through stage 4 chronic kidney disease, or unspecified chronic kidney disease: Secondary | ICD-10-CM | POA: Diagnosis not present

## 2024-02-07 DIAGNOSIS — D631 Anemia in chronic kidney disease: Secondary | ICD-10-CM | POA: Insufficient documentation

## 2024-02-07 DIAGNOSIS — Z7189 Other specified counseling: Secondary | ICD-10-CM

## 2024-02-07 DIAGNOSIS — N184 Chronic kidney disease, stage 4 (severe): Secondary | ICD-10-CM | POA: Insufficient documentation

## 2024-02-07 DIAGNOSIS — I129 Hypertensive chronic kidney disease with stage 1 through stage 4 chronic kidney disease, or unspecified chronic kidney disease: Secondary | ICD-10-CM | POA: Insufficient documentation

## 2024-02-07 DIAGNOSIS — L97818 Non-pressure chronic ulcer of other part of right lower leg with other specified severity: Secondary | ICD-10-CM | POA: Diagnosis not present

## 2024-02-07 DIAGNOSIS — L97828 Non-pressure chronic ulcer of other part of left lower leg with other specified severity: Secondary | ICD-10-CM | POA: Diagnosis not present

## 2024-02-07 DIAGNOSIS — I509 Heart failure, unspecified: Secondary | ICD-10-CM | POA: Diagnosis not present
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
## 2024-02-08 ENCOUNTER — Inpatient Hospital Stay

## 2024-02-08 ENCOUNTER — Encounter (INDEPENDENT_AMBULATORY_CARE_PROVIDER_SITE_OTHER)

## 2024-02-09 DIAGNOSIS — I872 Venous insufficiency (chronic) (peripheral): Secondary | ICD-10-CM | POA: Diagnosis not present

## 2024-02-09 DIAGNOSIS — L97828 Non-pressure chronic ulcer of other part of left lower leg with other specified severity: Secondary | ICD-10-CM | POA: Diagnosis not present

## 2024-02-09 DIAGNOSIS — L97818 Non-pressure chronic ulcer of other part of right lower leg with other specified severity: Secondary | ICD-10-CM | POA: Diagnosis not present

## 2024-02-09 DIAGNOSIS — I509 Heart failure, unspecified: Secondary | ICD-10-CM | POA: Diagnosis not present

## 2024-02-09 DIAGNOSIS — E1122 Type 2 diabetes mellitus with diabetic chronic kidney disease: Secondary | ICD-10-CM | POA: Diagnosis not present

## 2024-02-09 DIAGNOSIS — I130 Hypertensive heart and chronic kidney disease with heart failure and stage 1 through stage 4 chronic kidney disease, or unspecified chronic kidney disease: Secondary | ICD-10-CM | POA: Diagnosis not present

## 2024-02-13 ENCOUNTER — Telehealth: Payer: Self-pay

## 2024-02-13 ENCOUNTER — Telehealth (INDEPENDENT_AMBULATORY_CARE_PROVIDER_SITE_OTHER): Payer: Self-pay

## 2024-02-13 DIAGNOSIS — E1122 Type 2 diabetes mellitus with diabetic chronic kidney disease: Secondary | ICD-10-CM | POA: Diagnosis not present

## 2024-02-13 DIAGNOSIS — I130 Hypertensive heart and chronic kidney disease with heart failure and stage 1 through stage 4 chronic kidney disease, or unspecified chronic kidney disease: Secondary | ICD-10-CM | POA: Diagnosis not present

## 2024-02-13 DIAGNOSIS — L97818 Non-pressure chronic ulcer of other part of right lower leg with other specified severity: Secondary | ICD-10-CM | POA: Diagnosis not present

## 2024-02-13 DIAGNOSIS — L97828 Non-pressure chronic ulcer of other part of left lower leg with other specified severity: Secondary | ICD-10-CM | POA: Diagnosis not present

## 2024-02-13 DIAGNOSIS — I509 Heart failure, unspecified: Secondary | ICD-10-CM | POA: Diagnosis not present

## 2024-02-13 DIAGNOSIS — I872 Venous insufficiency (chronic) (peripheral): Secondary | ICD-10-CM | POA: Diagnosis not present

## 2024-02-13 NOTE — Telephone Encounter (Signed)
 [name] requesting verbal orders

## 2024-02-13 NOTE — Telephone Encounter (Signed)
 LMTCB for verbal orders; VM did not say it was secure to leave a message on.

## 2024-02-13 NOTE — Telephone Encounter (Signed)
 Copied from CRM [id number]. Topic: Clinical - Home Health Verbal Orders >> Feb 13, 2024  8:41 AM Wess RAMAN wrote: Caller/Agency: [facility]/ [facility] Callback Number: [phone] Service Requested: Social Work Visit for assessment Frequency: Initial assessment  Any new concerns about the patient? No

## 2024-02-13 NOTE — Telephone Encounter (Signed)
 Returned patient call from message left on nurse line.

## 2024-02-14 ENCOUNTER — Telehealth: Payer: Self-pay

## 2024-02-14 ENCOUNTER — Telehealth (INDEPENDENT_AMBULATORY_CARE_PROVIDER_SITE_OTHER): Payer: Self-pay

## 2024-02-14 NOTE — Telephone Encounter (Signed)
 Verbal orders given to [facility]

## 2024-02-14 NOTE — Telephone Encounter (Signed)
 [facility] with [facility] [facility] home health pt/ot reach out to see if our office will approve verbal orders to start home health for weekly unna wraps. I left a message with [name] to informed that is fine since patient is currently receiving unna wraps.

## 2024-02-14 NOTE — Telephone Encounter (Signed)
 Verbal orders left on secure VM for [facility]

## 2024-02-14 NOTE — Telephone Encounter (Signed)
 Copied from CRM [id number]. Topic: Clinical - Home Health Verbal Orders >> Feb 14, 2024  8:02 AM Berneda FALCON wrote: Caller/Agency: [facility], PT with [facility] Number: [phone] Service Requested: Physical Therapy, Home Health Aide Frequency: 2X week for 1 week beginning July 7th (PT), 2X/ week for weeks (home health aide)  Any new concerns about the patient? Yes, patient is non-compliant with medication even with wife at home. Blood pressure was 190/90 at 3PM on 7/7 because he did not take his medications all day. Patient is getting more wounds on legs but he is going to the vein clinic tomorrow (7/9). Requesting order for Home Health aide 2x week for 4 weeks to assist the patient with bathing.  Physical therapy was ordered, but he was already seeing him. Requesting increase listed above.

## 2024-02-15 ENCOUNTER — Ambulatory Visit (INDEPENDENT_AMBULATORY_CARE_PROVIDER_SITE_OTHER): Admitting: Nurse Practitioner

## 2024-02-15 ENCOUNTER — Encounter (INDEPENDENT_AMBULATORY_CARE_PROVIDER_SITE_OTHER): Payer: Self-pay

## 2024-02-15 VITALS — BP 135/75 | HR 52 | Resp 16

## 2024-02-15 DIAGNOSIS — I872 Venous insufficiency (chronic) (peripheral): Secondary | ICD-10-CM

## 2024-02-15 NOTE — Progress Notes (Signed)
 History of Present Illness  There is no documented history at this time  Assessments & Plan   There are no diagnoses linked to this encounter.    Additional instructions  Subjective:  Patient presents with venous ulcer of the Bilateral lower extremity.    Procedure:  3 layer unna wrap was placed Bilateral lower extremity.   Plan:   Follow up in one week.

## 2024-02-16 DIAGNOSIS — L97818 Non-pressure chronic ulcer of other part of right lower leg with other specified severity: Secondary | ICD-10-CM | POA: Diagnosis not present

## 2024-02-16 DIAGNOSIS — I509 Heart failure, unspecified: Secondary | ICD-10-CM | POA: Diagnosis not present

## 2024-02-16 DIAGNOSIS — I872 Venous insufficiency (chronic) (peripheral): Secondary | ICD-10-CM | POA: Diagnosis not present

## 2024-02-16 DIAGNOSIS — I130 Hypertensive heart and chronic kidney disease with heart failure and stage 1 through stage 4 chronic kidney disease, or unspecified chronic kidney disease: Secondary | ICD-10-CM | POA: Diagnosis not present

## 2024-02-16 DIAGNOSIS — E1122 Type 2 diabetes mellitus with diabetic chronic kidney disease: Secondary | ICD-10-CM | POA: Diagnosis not present

## 2024-02-16 DIAGNOSIS — L97828 Non-pressure chronic ulcer of other part of left lower leg with other specified severity: Secondary | ICD-10-CM | POA: Diagnosis not present

## 2024-02-20 ENCOUNTER — Other Ambulatory Visit: Payer: Self-pay | Admitting: Family

## 2024-02-21 DIAGNOSIS — E1122 Type 2 diabetes mellitus with diabetic chronic kidney disease: Secondary | ICD-10-CM | POA: Diagnosis not present

## 2024-02-21 DIAGNOSIS — E785 Hyperlipidemia, unspecified: Secondary | ICD-10-CM | POA: Diagnosis not present

## 2024-02-21 DIAGNOSIS — N184 Chronic kidney disease, stage 4 (severe): Secondary | ICD-10-CM | POA: Diagnosis not present

## 2024-02-21 DIAGNOSIS — I129 Hypertensive chronic kidney disease with stage 1 through stage 4 chronic kidney disease, or unspecified chronic kidney disease: Secondary | ICD-10-CM | POA: Diagnosis not present

## 2024-02-21 DIAGNOSIS — R809 Proteinuria, unspecified: Secondary | ICD-10-CM | POA: Diagnosis not present

## 2024-02-21 DIAGNOSIS — N2581 Secondary hyperparathyroidism of renal origin: Secondary | ICD-10-CM | POA: Diagnosis not present

## 2024-02-21 DIAGNOSIS — D631 Anemia in chronic kidney disease: Secondary | ICD-10-CM | POA: Diagnosis not present

## 2024-02-22 ENCOUNTER — Encounter (INDEPENDENT_AMBULATORY_CARE_PROVIDER_SITE_OTHER): Payer: Self-pay | Admitting: Vascular Surgery

## 2024-02-22 ENCOUNTER — Telehealth: Payer: Self-pay

## 2024-02-22 ENCOUNTER — Ambulatory Visit (INDEPENDENT_AMBULATORY_CARE_PROVIDER_SITE_OTHER): Admitting: Vascular Surgery

## 2024-02-22 VITALS — BP 134/90 | HR 80 | Resp 18

## 2024-02-22 DIAGNOSIS — L97828 Non-pressure chronic ulcer of other part of left lower leg with other specified severity: Secondary | ICD-10-CM | POA: Diagnosis not present

## 2024-02-22 DIAGNOSIS — I872 Venous insufficiency (chronic) (peripheral): Secondary | ICD-10-CM

## 2024-02-22 DIAGNOSIS — E1121 Type 2 diabetes mellitus with diabetic nephropathy: Secondary | ICD-10-CM | POA: Diagnosis not present

## 2024-02-22 DIAGNOSIS — L97818 Non-pressure chronic ulcer of other part of right lower leg with other specified severity: Secondary | ICD-10-CM | POA: Diagnosis not present

## 2024-02-22 DIAGNOSIS — I130 Hypertensive heart and chronic kidney disease with heart failure and stage 1 through stage 4 chronic kidney disease, or unspecified chronic kidney disease: Secondary | ICD-10-CM | POA: Diagnosis not present

## 2024-02-22 DIAGNOSIS — E1122 Type 2 diabetes mellitus with diabetic chronic kidney disease: Secondary | ICD-10-CM | POA: Diagnosis not present

## 2024-02-22 DIAGNOSIS — I10 Essential (primary) hypertension: Secondary | ICD-10-CM | POA: Diagnosis not present

## 2024-02-22 DIAGNOSIS — I509 Heart failure, unspecified: Secondary | ICD-10-CM | POA: Diagnosis not present

## 2024-02-22 DIAGNOSIS — E7800 Pure hypercholesterolemia, unspecified: Secondary | ICD-10-CM

## 2024-02-22 NOTE — Telephone Encounter (Signed)
 Copied from CRM [id number]. Topic: Clinical - Home Health Verbal Orders >> Feb 22, 2024 10:16 AM Charlet HERO wrote: Caller/Agency: [facility] Callback Number: [phone] Service Requested: Social worker  Frequency: 1 per week for 2 weeks Any new concerns about the patient? No

## 2024-02-22 NOTE — Progress Notes (Signed)
 Subjective:    Patient ID: Daniel Kline, male    DOB: [birth year], 78 y.o.   MRN: [id number] Chief Complaint  Patient presents with   Follow-up    Unna wrap follow up    Daniel Kline is a 78 yo male who presents to clinic today with chief complaint of bilateral lower extremity Swelling worsening over the past 3 months.  Patient endorses over the last 3 months his legs are swollen to where they become erythemic pink.  He has skin scaling with intermittent blisters and small scabs where he is scratching at his legs.  Edema today he endorses is better post unna boot wrapping over the past 4 weeks. He endorses that it can be very painful at times especially when he attempts to walk. 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 He states he is unable to use his light box at home due to his lower leg swelling. I recommended he make an appointment to see Dr Verlena Comer his treating doctor. I will place a call to her to get him a follow up appointment.       Review of Systems  Constitutional: Negative.   Cardiovascular:  Positive for leg swelling.  Musculoskeletal:  Positive for back pain.  Skin:  Positive for color change.       Lower extremity erythema  All other systems reviewed and are negative.      Objective:   Physical Exam Vitals reviewed.  Constitutional:      Appearance: Normal appearance. He is obese.  HENT:     Head: Normocephalic.  Eyes:     Pupils: Pupils are equal, round, and reactive to light.  Cardiovascular:     Rate and Rhythm: Normal rate and regular rhythm.     Pulses: Normal pulses.     Heart sounds: Normal heart sounds.  Pulmonary:     Effort: Pulmonary effort is normal.     Breath sounds: Normal breath sounds.  Abdominal:     General: Bowel sounds are normal.     Palpations: Abdomen is soft.  Musculoskeletal:        General: Swelling and tenderness present.     Right lower leg: Edema present.     Left lower leg: Edema present.  Skin:     General: Skin is warm and dry.     Capillary Refill: Capillary refill takes 2 to 3 seconds.  Neurological:     General: No focal deficit present.     Mental Status: He is alert and oriented to person, place, and time. Mental status is at baseline.  Psychiatric:        Mood and Affect: Mood normal.        Behavior: Behavior normal.        Thought Content: Thought content normal.        Judgment: Judgment normal.     BP (!) 134/90   Pulse 80   Resp 18   Past Medical History:  Diagnosis Date   Anemia    H/O   Anxiety    Arthritis    Atrial flutter (HCC)    a. s/p post ablation in 04/2017   Chronic kidney disease    Complication of anesthesia    CTCL (cutaneous T-cell lymphoma) (HCC)    Diabetes mellitus without complication (HCC)    Diastolic dysfunction    a. 03/2022 Echo: EF 60-65%, no rwma, GrI DD, nl RV fxn.   Dysplastic nevus 12/19/2017   Right distal  lat. forearm near wrist. Severe atypia, close to peripheral margin.   Dysplastic nevus 06/21/2018   Upper back right paraspinal. Severe atypia, peripheral margin involved. Excised 07/11/2018, margins free.   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 forearm. SCCis, hypertrophic.    Social History   Socioeconomic History   Marital status: Married    Spouse name: Not on file   Number of children: 0   Years of education: Not on file   Highest education level: Not on file  Occupational History   Occupation: Corporate investment banker    Comment: when  younger   Occupation: Engineering geologist    Comment: Retired   Occupation: Scientist, research (medical)    Comment: Retired  Tobacco Use   Smoking status: Former    Types: Cigars    Quit date: 09/23/1990    Years since quitting: 33.4    Passive exposure: Past (as a child)   Smokeless tobacco: Former    Quit date: 02/25/2006  Vaping Use   Vaping status: Never Used  Substance and Sexual Activity   Alcohol use: No    Alcohol/week: 0.0 standard drinks of alcohol   Drug use: No   Sexual activity: Yes    Partners: Female  Other Topics Concern   Not on file  Social History Narrative   Has living will   Wife is health care POA---then brother or sister   Would accept resuscitation attempts but no prolonged ventilation or tube feeds   Social Drivers of Health   Financial Resource Strain: Low Risk  (07/20/2017)   Overall Financial Resource Strain (CARDIA)    Difficulty of Paying Living Expenses: Not hard at all  Food Insecurity: No Food Insecurity (01/05/2024)   Hunger Vital Sign    Worried About Running Out of Food in the Last Year: Never true    Ran Out of Food in the Last Year: Never true  Transportation Needs: No Transportation Needs (01/05/2024)   PRAPARE - Administrator, Civil Service (Medical): No    Lack of Transportation (Non-Medical): No  Physical Activity: Not on file  Stress: Not on file  Social Connections: Patient Unable To Answer (12/27/2023)   Social Connection and Isolation Panel    Frequency of Communication with Friends and Family: Patient unable to answer    Frequency of Social Gatherings with Friends and Family: Patient unable to answer    Attends Religious Services: Patient unable to answer    Active Member of Clubs or Organizations: Patient unable to answer    Attends Banker Meetings: Patient unable to answer    Marital Status: Patient unable to answer  Recent Concern: Social Connections - Moderately Isolated  (11/26/2023)   Social Connection and Isolation Panel    Frequency of Communication with Friends and Family: More than three times a week    Frequency of Social Gatherings with Friends and Family: Once a week  Attends Religious Services: Never    Active Member of Clubs or Organizations: No    Attends Banker Meetings: Never    Marital Status: Married  Catering manager Violence: Patient Unable To Answer (01/05/2024)   Humiliation, Afraid, Rape, and Kick questionnaire    Fear of Current or Ex-Partner: Patient unable to answer    Emotionally Abused: Patient unable to answer    Physically Abused: Patient unable to answer    Sexually Abused: Patient unable to answer    Past Surgical History:  Procedure Laterality Date   BACK SURGERY     LUMBAR   CARDIAC ELECTROPHYSIOLOGY STUDY AND ABLATION  2019   CATARACT EXTRACTION W/PHACO Right 05/05/2022   Procedure: CATARACT EXTRACTION PHACO AND INTRAOCULAR LENS PLACEMENT (IOC) RIGHT;  Surgeon: Mittie Gaskin, MD;  Location[name];  Service: Ophthalmology;  Laterality: Right;  Diabetic 10.42 01:13.4   COLONOSCOPY WITH PROPOFOL  N/A 04/04/2019   Procedure: COLONOSCOPY WITH PROPOFOL ;  Surgeon: Toledo, Ladell POUR, MD;  Location: [facility] END[facility]COPY;  Service: Gastroenterology;  Laterality: N/A;   ESOPHAGOGASTRODUODENOSCOPY N/A 11/28/2023   Procedure: EGD (ESOPHAGOGASTRODUODENOSCOPY);  Surgeon: Jinny Carmine, MD;  Location: [facility] END[facility]COPY;  Service: Endoscopy;  Laterality: N/A;   I & D EXTREMITY Right 02/01/2020   Procedure: IRRIGATION AND DEBRIDEMENT EXTREMITY with poly exchange;  Surgeon: Mardee Lynwood SQUIBB, MD;  Location: [facility] ORS;  Service: Orthopedics;  Laterality: Right;   INCISION AND DRAINAGE     BACK-MRSA INFECTION AFTER BACK SURGERY   KNEE ARTHROPLASTY Right 01/28/2020   Procedure: COMPUTER ASSISTED TOTAL KNEE ARTHROPLASTY;  Surgeon: Mardee Lynwood SQUIBB, MD;  Location: [facility] ORS;  Service: Orthopedics;  Laterality: Right;   MOUTH  SURGERY     right elbow surgery     right knee surgery     right shoulder surgery     from polio damage   TONSILLECTOMY     TOTAL HIP ARTHROPLASTY Bilateral 04/2016    Family History  Adopted: Yes    Allergies  Allergen Reactions   Bee Venom Anaphylaxis   Oxycodone  Other (See Comments)    Delusions   Hydromorphone  Other (See Comments)    hallucinating   Hydroxychloroquine     Other reaction(s): Other (See Comments) He broke out really badly.   Zolpidem Other (See Comments)       Latest Ref Rng & Units 01/18/2024    4:38 PM 01/06/2024    9:48 AM 01/04/2024    3:15 PM  CBC  WBC 3.8 - 10.8 Thousand/uL 7.2  6.3  6.2   Hemoglobin 13.2 - 17.1 g/dL 89.7  8.7 Repeated and verified X2.  8.8   Hematocrit 38.5 - 50.0 % 32.0  27.0  27.9   Platelets 140 - 400 Thousand/uL 160  154.0  145       CMP     Component Value Date/Time   NA 137 02/02/2024 1554   NA 141 10/28/2011 0325   K 4.4 02/02/2024 1554   K 3.9 10/28/2011 0325   CL 104 02/02/2024 1554   CL 106 10/28/2011 0325   CO2 22 02/02/2024 1554   CO2 25 10/28/2011 0325   GLUCOSE 93 02/02/2024 1554   GLUCOSE 121 (H) 10/28/2011 0325   BUN 47 (H) 02/02/2024 1554   BUN 22 (H) 10/28/2011 0325   CREATININE 4.08 (H) 02/02/2024 1554   CREATININE 1.27 (H) 02/09/2016 1421   CALCIUM  9.6 02/02/2024 1554   CALCIUM  7.9 (L) 10/28/2011 0325   PROT 6.6 12/27/2023 1127   ALBUMIN 3.9  02/02/2024 1554   AST 10 (L) 12/27/2023 1127   ALT 10 12/27/2023 1127   ALKPHOS 59 12/27/2023 1127   BILITOT 0.5 12/27/2023 1127   GFR 13.25 (LL) 02/02/2024 1554   GFRNONAA 18 (L) 12/31/2023 0741   GFRNONAA 56 (L) 02/09/2016 1421     No results found.     Assessment & Plan:   1. Chronic venous insufficiency (Primary) No surgery or intervention at this point in time.    I have had a long discussion with the patient regarding venous insufficiency and why it  causes symptoms, specifically venous ulceration. I have discussed with the patient the  chronic skin changes that accompany venous insufficiency and the long term sequela such as infection and recurring  ulceration.  Patient will be placed in [employer] which will be changed weekly drainage permitting.  In addition, behavioral modification including several periods of elevation of the lower extremities during the day will be continued. Achieving a position with the ankles at heart level was stressed to the patient  The patient is instructed to begin routine exercise, especially walking on a daily basis  In the future the patient can be assessed for graduated compression stockings or wraps as well as a Lymph Pump once the ulcers are healed.  2. Essential hypertension Continue antihypertensive medications as already ordered, these medications have been reviewed and there are no changes at this time.  3. Type 2 diabetes mellitus with diabetic nephropathy, without long-term current use of insulin  (HCC) Continue hypoglycemic medications as already ordered, these medications have been reviewed and there are no changes at this time.  Hgb A1C to be monitored as already arranged by primary service  4. Hypercholesterolemia Continue statin as ordered and reviewed, no changes at this time  5. Morbid obesity (HCC) Discussed with the patient the need to lose weight as this effects the swelling to his lower extremities as well as his overall health.    Current Outpatient Medications on File Prior to Visit  Medication Sig Dispense Refill   calcitRIOL  (ROCALTROL ) 0.25 MCG capsule Take 0.25 mcg by mouth daily.     carvedilol  (COREG ) 3.125 MG tablet TAKE 1 TABLET BY MOUTH 2 TIMES DAILY WITH A MEAL. 180 tablet 3   cephALEXin  (KEFLEX ) 500 MG capsule Take 1 capsule (500 mg total) by mouth 2 (two) times daily. 14 capsule 0   Cholecalciferol  (VITAMIN D -3) 25 MCG (1000 UT) CAPS Take 1,000 Units by mouth daily.     clorazepate  (TRANXENE ) 7.5 MG tablet TAKE 1 TABLET BY MOUTH TWICE A DAY AS NEEDED FOR  ANXIETY 60 tablet 0   cyanocobalamin  (VITAMIN B12) 500 MCG tablet Take 1,000 mcg by mouth daily.     EPINEPHrine  0.3 mg/0.3 mL IJ SOAJ injection Inject 0.3 mg into the muscle as needed for anaphylaxis. 1 each 5   Ferrous Sulfate  (IRON PO) Take 1 tablet by mouth at bedtime.     folic acid  (FOLVITE ) 1 MG tablet Take 1 mg by mouth daily.     furosemide  (LASIX ) 80 MG tablet Take 1 tablet (80 mg total) by mouth daily. 30 tablet 3   gabapentin  (NEURONTIN ) 100 MG capsule Take 2 capsules (200 mg total) by mouth 3 (three) times daily. 180 capsule 0   glucose blood (ONETOUCH VERIO) test strip Use to check blood sugar once daily 100 each 12   HYDROcodone -acetaminophen  (NORCO/VICODIN) 5-325 MG tablet TAKE ONE TABLET BY MOUTH EVERY 6 HOURS AS NEEDED FOR PAIN 60 tablet 0   hydrocortisone   2.5 % cream Apply topically 3 (three) times daily as needed. 56 g 3   isosorbide  mononitrate (IMDUR ) 60 MG 24 hr tablet Take 1 tablet (60 mg total) by mouth daily before lunch. 90 tablet 3   latanoprost  (XALATAN ) 0.005 % ophthalmic solution Place 1 drop into both eyes at bedtime.     MAGNESIUM  PO Take 1 tablet by mouth daily.     melatonin 5 MG TABS Take 1 tablet (5 mg total) by mouth at bedtime as needed. 30 tablet 0   pantoprazole  (PROTONIX ) 40 MG tablet Take 1 tablet (40 mg total) by mouth 2 (two) times daily. 60 tablet 0   QUEtiapine  (SEROQUEL ) 50 MG tablet Take 1 tablet (50 mg total) by mouth at bedtime. 30 tablet 0   rosuvastatin  (CRESTOR ) 10 MG tablet TAKE ONE TABLET BY MOUTH EVERY DAY 90 tablet 3   No current facility-administered medications on file prior to visit.    There are no Patient Instructions on file for this visit. No follow-ups on file.   Gwendlyn JONELLE Shank, NP

## 2024-02-23 ENCOUNTER — Telehealth: Payer: Self-pay

## 2024-02-23 DIAGNOSIS — L97818 Non-pressure chronic ulcer of other part of right lower leg with other specified severity: Secondary | ICD-10-CM | POA: Diagnosis not present

## 2024-02-23 DIAGNOSIS — E1122 Type 2 diabetes mellitus with diabetic chronic kidney disease: Secondary | ICD-10-CM | POA: Diagnosis not present

## 2024-02-23 DIAGNOSIS — I130 Hypertensive heart and chronic kidney disease with heart failure and stage 1 through stage 4 chronic kidney disease, or unspecified chronic kidney disease: Secondary | ICD-10-CM | POA: Diagnosis not present

## 2024-02-23 DIAGNOSIS — I872 Venous insufficiency (chronic) (peripheral): Secondary | ICD-10-CM | POA: Diagnosis not present

## 2024-02-23 DIAGNOSIS — I509 Heart failure, unspecified: Secondary | ICD-10-CM | POA: Diagnosis not present

## 2024-02-23 DIAGNOSIS — L97828 Non-pressure chronic ulcer of other part of left lower leg with other specified severity: Secondary | ICD-10-CM | POA: Diagnosis not present

## 2024-02-23 NOTE — Progress Notes (Unsigned)
 Complex Care Management Note Care Guide Note  02/23/2024 Name: Daniel Kline MRN: [id number] DOB: [birth year]   Complex Care Management Outreach Attempts: An unsuccessful telephone outreach was attempted today to offer the patient information about available complex care management services.  Follow Up Plan:  Additional outreach attempts will be made to offer the patient complex care management information and services.   Encounter Outcome:  No Answer  Dreama Lynwood [facility]  [place], [facility] Care Management Assistant Direct Dial: [phone]  Fax: [phone]

## 2024-02-23 NOTE — Telephone Encounter (Signed)
 Verbal orders given to [facility]

## 2024-02-24 NOTE — Progress Notes (Signed)
 Complex Care Management Note Care Guide Note  02/24/2024 Name: Daniel Kline MRN: [id number] DOB: 11/07/1945   Complex Care Management Outreach Attempts: A second unsuccessful outreach was attempted today to offer the patient with information about available complex care management services.  Follow Up Plan:  Additional outreach attempts will be made to offer the patient complex care management information and services.   Encounter Outcome:  No Answer  Dreama Lynwood [facility]  [facility], [facility] Care Management Assistant Direct Dial: [phone]  Fax: [phone]

## 2024-02-24 NOTE — Progress Notes (Signed)
 Complex Care Management Note  Care Guide Note 02/24/2024 Name: Daniel Kline MRN: [id number] DOB: 1946-08-02  Daniel Kline is a 78 y.o. year old male who sees [name], MD for primary care. I reached out to [name] by phone today to offer complex care management services.  [name] was given information about Complex Care Management services today including:   The Complex Care Management services include support from the care team which includes your Nurse Care Manager, Clinical Social Worker, or Pharmacist.  The Complex Care Management team is here to help remove barriers to the health concerns and goals most important to you. Complex Care Management services are voluntary, and the patient may decline or stop services at any time by request to their care team member.   Complex Care Management Consent Status: Patient agreed to services and verbal consent obtained.   Follow up plan:  Telephone appointment with complex care management team member scheduled for:  03/13/24 at 11:00 a.m.   Encounter Outcome:  Patient Scheduled  Dreama Lynwood [facility]  [facility], [facility] Care Management Assistant Direct Dial: [phone]  Fax: [phone]

## 2024-02-27 DIAGNOSIS — L97828 Non-pressure chronic ulcer of other part of left lower leg with other specified severity: Secondary | ICD-10-CM | POA: Diagnosis not present

## 2024-02-27 DIAGNOSIS — I130 Hypertensive heart and chronic kidney disease with heart failure and stage 1 through stage 4 chronic kidney disease, or unspecified chronic kidney disease: Secondary | ICD-10-CM | POA: Diagnosis not present

## 2024-02-27 DIAGNOSIS — E1122 Type 2 diabetes mellitus with diabetic chronic kidney disease: Secondary | ICD-10-CM | POA: Diagnosis not present

## 2024-02-27 DIAGNOSIS — I509 Heart failure, unspecified: Secondary | ICD-10-CM | POA: Diagnosis not present

## 2024-02-27 DIAGNOSIS — L97818 Non-pressure chronic ulcer of other part of right lower leg with other specified severity: Secondary | ICD-10-CM | POA: Diagnosis not present

## 2024-02-27 DIAGNOSIS — I872 Venous insufficiency (chronic) (peripheral): Secondary | ICD-10-CM | POA: Diagnosis not present

## 2024-02-28 ENCOUNTER — Emergency Department

## 2024-02-28 ENCOUNTER — Inpatient Hospital Stay
Admission: EM | Admit: 2024-02-28 | Discharge: 2024-03-04 | DRG: 683 | Disposition: A | Discharge status: Home Health | Attending: Internal Medicine | Admitting: Internal Medicine

## 2024-02-28 ENCOUNTER — Other Ambulatory Visit: Payer: Self-pay

## 2024-02-28 ENCOUNTER — Telehealth: Payer: Self-pay | Admitting: Internal Medicine

## 2024-02-28 DIAGNOSIS — R001 Bradycardia, unspecified: Principal | ICD-10-CM | POA: Diagnosis present

## 2024-02-28 DIAGNOSIS — E875 Hyperkalemia: Secondary | ICD-10-CM

## 2024-02-28 DIAGNOSIS — R0602 Shortness of breath: Secondary | ICD-10-CM | POA: Diagnosis not present

## 2024-02-28 DIAGNOSIS — Z91030 Bee allergy status: Secondary | ICD-10-CM

## 2024-02-28 DIAGNOSIS — T502X5A Adverse effect of carbonic-anhydrase inhibitors, benzothiadiazides and other diuretics, initial encounter: Secondary | ICD-10-CM | POA: Diagnosis present

## 2024-02-28 DIAGNOSIS — I499 Cardiac arrhythmia, unspecified: Secondary | ICD-10-CM | POA: Diagnosis not present

## 2024-02-28 DIAGNOSIS — M7989 Other specified soft tissue disorders: Secondary | ICD-10-CM | POA: Diagnosis not present

## 2024-02-28 DIAGNOSIS — Z888 Allergy status to other drugs, medicaments and biological substances status: Secondary | ICD-10-CM

## 2024-02-28 DIAGNOSIS — Z8616 Personal history of COVID-19: Secondary | ICD-10-CM

## 2024-02-28 DIAGNOSIS — N189 Chronic kidney disease, unspecified: Secondary | ICD-10-CM | POA: Diagnosis not present

## 2024-02-28 DIAGNOSIS — E7800 Pure hypercholesterolemia, unspecified: Secondary | ICD-10-CM | POA: Diagnosis present

## 2024-02-28 DIAGNOSIS — N179 Acute kidney failure, unspecified: Principal | ICD-10-CM | POA: Diagnosis present

## 2024-02-28 DIAGNOSIS — E8721 Acute metabolic acidosis: Secondary | ICD-10-CM | POA: Diagnosis not present

## 2024-02-28 DIAGNOSIS — I4892 Unspecified atrial flutter: Secondary | ICD-10-CM | POA: Diagnosis not present

## 2024-02-28 DIAGNOSIS — E860 Dehydration: Secondary | ICD-10-CM | POA: Diagnosis present

## 2024-02-28 DIAGNOSIS — I5032 Chronic diastolic (congestive) heart failure: Secondary | ICD-10-CM | POA: Diagnosis not present

## 2024-02-28 DIAGNOSIS — G4733 Obstructive sleep apnea (adult) (pediatric): Secondary | ICD-10-CM | POA: Diagnosis present

## 2024-02-28 DIAGNOSIS — G8929 Other chronic pain: Secondary | ICD-10-CM | POA: Diagnosis present

## 2024-02-28 DIAGNOSIS — Z8614 Personal history of Methicillin resistant Staphylococcus aureus infection: Secondary | ICD-10-CM

## 2024-02-28 DIAGNOSIS — I700 Atherosclerosis of aorta: Secondary | ICD-10-CM | POA: Diagnosis not present

## 2024-02-28 DIAGNOSIS — M069 Rheumatoid arthritis, unspecified: Secondary | ICD-10-CM | POA: Diagnosis not present

## 2024-02-28 DIAGNOSIS — Z87898 Personal history of other specified conditions: Secondary | ICD-10-CM

## 2024-02-28 DIAGNOSIS — N184 Chronic kidney disease, stage 4 (severe): Secondary | ICD-10-CM | POA: Diagnosis present

## 2024-02-28 DIAGNOSIS — T447X5S Adverse effect of beta-adrenoreceptor antagonists, sequela: Secondary | ICD-10-CM | POA: Diagnosis not present

## 2024-02-28 DIAGNOSIS — I872 Venous insufficiency (chronic) (peripheral): Secondary | ICD-10-CM | POA: Diagnosis present

## 2024-02-28 DIAGNOSIS — Z85828 Personal history of other malignant neoplasm of skin: Secondary | ICD-10-CM

## 2024-02-28 DIAGNOSIS — H409 Unspecified glaucoma: Secondary | ICD-10-CM | POA: Diagnosis present

## 2024-02-28 DIAGNOSIS — R0902 Hypoxemia: Secondary | ICD-10-CM | POA: Diagnosis not present

## 2024-02-28 DIAGNOSIS — E1122 Type 2 diabetes mellitus with diabetic chronic kidney disease: Secondary | ICD-10-CM | POA: Diagnosis not present

## 2024-02-28 DIAGNOSIS — F419 Anxiety disorder, unspecified: Secondary | ICD-10-CM | POA: Diagnosis present

## 2024-02-28 DIAGNOSIS — D638 Anemia in other chronic diseases classified elsewhere: Secondary | ICD-10-CM | POA: Diagnosis present

## 2024-02-28 DIAGNOSIS — Z885 Allergy status to narcotic agent status: Secondary | ICD-10-CM

## 2024-02-28 DIAGNOSIS — I10 Essential (primary) hypertension: Secondary | ICD-10-CM | POA: Diagnosis present

## 2024-02-28 DIAGNOSIS — C8590 Non-Hodgkin lymphoma, unspecified, unspecified site: Secondary | ICD-10-CM | POA: Diagnosis not present

## 2024-02-28 DIAGNOSIS — E1121 Type 2 diabetes mellitus with diabetic nephropathy: Secondary | ICD-10-CM | POA: Diagnosis not present

## 2024-02-28 DIAGNOSIS — G4734 Idiopathic sleep related nonobstructive alveolar hypoventilation: Secondary | ICD-10-CM | POA: Diagnosis not present

## 2024-02-28 DIAGNOSIS — R42 Dizziness and giddiness: Secondary | ICD-10-CM

## 2024-02-28 DIAGNOSIS — Z9981 Dependence on supplemental oxygen: Secondary | ICD-10-CM

## 2024-02-28 DIAGNOSIS — I129 Hypertensive chronic kidney disease with stage 1 through stage 4 chronic kidney disease, or unspecified chronic kidney disease: Secondary | ICD-10-CM | POA: Diagnosis not present

## 2024-02-28 DIAGNOSIS — T447X5A Adverse effect of beta-adrenoreceptor antagonists, initial encounter: Secondary | ICD-10-CM

## 2024-02-28 DIAGNOSIS — Z6835 Body mass index (BMI) 35.0-35.9, adult: Secondary | ICD-10-CM

## 2024-02-28 DIAGNOSIS — I517 Cardiomegaly: Secondary | ICD-10-CM | POA: Diagnosis not present

## 2024-02-28 DIAGNOSIS — Z96651 Presence of right artificial knee joint: Secondary | ICD-10-CM | POA: Diagnosis present

## 2024-02-28 DIAGNOSIS — R6889 Other general symptoms and signs: Secondary | ICD-10-CM | POA: Diagnosis not present

## 2024-02-28 DIAGNOSIS — I130 Hypertensive heart and chronic kidney disease with heart failure and stage 1 through stage 4 chronic kidney disease, or unspecified chronic kidney disease: Secondary | ICD-10-CM | POA: Diagnosis present

## 2024-02-28 DIAGNOSIS — Z96643 Presence of artificial hip joint, bilateral: Secondary | ICD-10-CM | POA: Diagnosis present

## 2024-02-28 DIAGNOSIS — C84A0 Cutaneous T-cell lymphoma, unspecified, unspecified site: Secondary | ICD-10-CM | POA: Diagnosis present

## 2024-02-28 DIAGNOSIS — R531 Weakness: Secondary | ICD-10-CM | POA: Diagnosis not present

## 2024-02-28 DIAGNOSIS — T447X5D Adverse effect of beta-adrenoreceptor antagonists, subsequent encounter: Secondary | ICD-10-CM | POA: Diagnosis not present

## 2024-02-28 DIAGNOSIS — Z8612 Personal history of poliomyelitis: Secondary | ICD-10-CM

## 2024-02-28 DIAGNOSIS — Z79899 Other long term (current) drug therapy: Secondary | ICD-10-CM

## 2024-02-28 DIAGNOSIS — D631 Anemia in chronic kidney disease: Secondary | ICD-10-CM | POA: Diagnosis not present

## 2024-02-28 DIAGNOSIS — N178 Other acute kidney failure: Secondary | ICD-10-CM | POA: Diagnosis not present

## 2024-02-28 DIAGNOSIS — R5383 Other fatigue: Secondary | ICD-10-CM | POA: Diagnosis not present

## 2024-02-28 DIAGNOSIS — I272 Pulmonary hypertension, unspecified: Secondary | ICD-10-CM | POA: Diagnosis present

## 2024-02-28 DIAGNOSIS — Z87891 Personal history of nicotine dependence: Secondary | ICD-10-CM

## 2024-02-28 LAB — CBC WITH DIFFERENTIAL/PLATELET
Abs Immature Granulocytes: 0.02 K/uL (ref 0.00–0.07)
Basophils Absolute: 0.1 K/uL (ref 0.0–0.1)
Basophils Relative: 1 %
Eosinophils Absolute: 0.1 K/uL (ref 0.0–0.5)
Eosinophils Relative: 2 %
HCT: 30.4 % — ABNORMAL LOW (ref 39.0–52.0)
Hemoglobin: 9.8 g/dL — ABNORMAL LOW (ref 13.0–17.0)
Immature Granulocytes: 0 %
Lymphocytes Relative: 27 %
Lymphs Abs: 1.5 K/uL (ref 0.7–4.0)
MCH: 30 pg (ref 26.0–34.0)
MCHC: 32.2 g/dL (ref 30.0–36.0)
MCV: 93 fL (ref 80.0–100.0)
Monocytes Absolute: 0.6 K/uL (ref 0.1–1.0)
Monocytes Relative: 10 %
Neutro Abs: 3.3 K/uL (ref 1.7–7.7)
Neutrophils Relative %: 60 %
Platelets: 177 K/uL (ref 150–400)
RBC: 3.27 MIL/uL — ABNORMAL LOW (ref 4.22–5.81)
RDW: 14.1 % (ref 11.5–15.5)
WBC: 5.6 K/uL (ref 4.0–10.5)
nRBC: 0 % (ref 0.0–0.2)

## 2024-02-28 LAB — COMPREHENSIVE METABOLIC PANEL WITH GFR
ALT: 16 U/L (ref 0–44)
AST: 22 U/L (ref 15–41)
Albumin: 3.3 g/dL — ABNORMAL LOW (ref 3.5–5.0)
Alkaline Phosphatase: 51 U/L (ref 38–126)
Anion gap: 9 (ref 5–15)
BUN: 72 mg/dL — ABNORMAL HIGH (ref 8–23)
CO2: 18 mmol/L — ABNORMAL LOW (ref 22–32)
Calcium: 9.2 mg/dL (ref 8.9–10.3)
Chloride: 107 mmol/L (ref 98–111)
Creatinine, Ser: 5.11 mg/dL — ABNORMAL HIGH (ref 0.61–1.24)
GFR, Estimated: 11 mL/min — ABNORMAL LOW (ref >60)
Glucose, Bld: 85 mg/dL (ref 70–99)
Potassium: 5.2 mmol/L — ABNORMAL HIGH (ref 3.5–5.1)
Sodium: 134 mmol/L — ABNORMAL LOW (ref 135–145)
Total Bilirubin: 0.5 mg/dL (ref 0.0–1.2)
Total Protein: 6.5 g/dL (ref 6.5–8.1)

## 2024-02-28 LAB — CBG MONITORING, ED: Glucose-Capillary: 88 mg/dL (ref 70–99)

## 2024-02-28 LAB — TROPONIN I (HIGH SENSITIVITY)
Troponin I (High Sensitivity): 10 ng/L (ref <18)
Troponin I (High Sensitivity): 9 ng/L (ref <18)

## 2024-02-28 LAB — BRAIN NATRIURETIC PEPTIDE: B Natriuretic Peptide: 149.1 pg/mL — ABNORMAL HIGH (ref 0.0–100.0)

## 2024-02-28 LAB — MAGNESIUM: Magnesium: 2.2 mg/dL (ref 1.7–2.4)

## 2024-02-28 MED ORDER — CLORAZEPATE DIPOTASSIUM 7.5 MG PO TABS
7.5000 mg | ORAL_TABLET | Freq: Two times a day (BID) | ORAL | Status: DC | PRN
  Filled 2024-02-28: qty 1

## 2024-02-28 MED ORDER — FERROUS SULFATE 325 (65 FE) MG PO TABS
325.0000 mg | ORAL_TABLET | Freq: Every day | ORAL | Status: DC
  Administered 2024-02-28 – 2024-03-03 (×5): 325 mg via ORAL
  Filled 2024-02-28 (×5): qty 1

## 2024-02-28 MED ORDER — PANTOPRAZOLE SODIUM 40 MG PO TBEC
40.0000 mg | DELAYED_RELEASE_TABLET | Freq: Two times a day (BID) | ORAL | Status: DC
  Administered 2024-02-28 – 2024-03-04 (×10): 40 mg via ORAL
  Filled 2024-02-28 (×10): qty 1

## 2024-02-28 MED ORDER — INSULIN ASPART 100 UNIT/ML IJ SOLN: 0.0000 [IU] | Freq: Every day | INTRAMUSCULAR | Status: DC

## 2024-02-28 MED ORDER — ENOXAPARIN SODIUM 30 MG/0.3ML IJ SOSY
30.0000 mg | PREFILLED_SYRINGE | INTRAMUSCULAR | Status: DC
  Administered 2024-02-28: 30 mg via SUBCUTANEOUS
  Filled 2024-02-28: qty 0.3

## 2024-02-28 MED ORDER — FOLIC ACID 1 MG PO TABS
1.0000 mg | ORAL_TABLET | Freq: Every day | ORAL | Status: DC
  Administered 2024-02-28 – 2024-03-03 (×5): 1 mg via ORAL
  Filled 2024-02-28 (×5): qty 1

## 2024-02-28 MED ORDER — INSULIN ASPART 100 UNIT/ML IJ SOLN
0.0000 [IU] | Freq: Three times a day (TID) | INTRAMUSCULAR | Status: DC
  Administered 2024-03-02 – 2024-03-03 (×3): 2 [IU] via SUBCUTANEOUS
  Filled 2024-02-28 (×4): qty 1

## 2024-02-28 MED ORDER — ACETAMINOPHEN 650 MG RE SUPP
650.0000 mg | Freq: Four times a day (QID) | RECTAL | Status: DC | PRN
Start: 2024-02-28 — End: 2024-03-04

## 2024-02-28 MED ORDER — VITAMIN D 25 MCG (1000 UNIT) PO TABS
1000.0000 [IU] | ORAL_TABLET | Freq: Every day | ORAL | Status: DC
  Administered 2024-02-29 – 2024-03-04 (×5): 1000 [IU] via ORAL
  Filled 2024-02-28 (×5): qty 1

## 2024-02-28 MED ORDER — ACETAMINOPHEN 325 MG PO TABS: 650.0000 mg | ORAL_TABLET | Freq: Four times a day (QID) | ORAL | Status: DC | PRN

## 2024-02-28 MED ORDER — HYDROCODONE-ACETAMINOPHEN 5-325 MG PO TABS: 1.0000 | ORAL_TABLET | Freq: Four times a day (QID) | ORAL | Status: DC | PRN

## 2024-02-28 MED ORDER — MELATONIN 5 MG PO TABS
5.0000 mg | ORAL_TABLET | Freq: Every evening | ORAL | Status: DC | PRN
  Administered 2024-03-01: 5 mg via ORAL
  Filled 2024-02-28: qty 1

## 2024-02-28 MED ORDER — QUETIAPINE FUMARATE 25 MG PO TABS
50.0000 mg | ORAL_TABLET | Freq: Every day | ORAL | Status: DC
  Administered 2024-02-28 – 2024-02-29 (×2): 50 mg via ORAL
  Filled 2024-02-28 (×2): qty 2

## 2024-02-28 MED ORDER — VITAMIN B-12 1000 MCG PO TABS
1000.0000 ug | ORAL_TABLET | Freq: Every day | ORAL | Status: DC
  Administered 2024-02-28 – 2024-03-03 (×5): 1000 ug via ORAL
  Filled 2024-02-28 (×2): qty 2
  Filled 2024-02-28 (×3): qty 1

## 2024-02-28 MED ORDER — ROSUVASTATIN CALCIUM 10 MG PO TABS
10.0000 mg | ORAL_TABLET | Freq: Every day | ORAL | Status: DC
  Administered 2024-02-29 – 2024-03-04 (×5): 10 mg via ORAL
  Filled 2024-02-28 (×5): qty 1

## 2024-02-28 MED ORDER — SODIUM CHLORIDE 0.9 % IV SOLN: INTRAVENOUS | Status: AC

## 2024-02-28 MED ORDER — CALCITRIOL 0.25 MCG PO CAPS
0.2500 ug | ORAL_CAPSULE | Freq: Every day | ORAL | Status: DC
  Administered 2024-02-29 – 2024-03-04 (×5): 0.25 ug via ORAL
  Filled 2024-02-28 (×5): qty 1

## 2024-02-28 MED ORDER — ONDANSETRON HCL 4 MG PO TABS: 4.0000 mg | ORAL_TABLET | Freq: Four times a day (QID) | ORAL | Status: DC | PRN

## 2024-02-28 MED ORDER — SODIUM CHLORIDE 0.9 % IV BOLUS
500.0000 mL | Freq: Once | INTRAVENOUS | Status: AC
  Administered 2024-02-28: 500 mL via INTRAVENOUS

## 2024-02-28 MED ORDER — SODIUM CHLORIDE 0.9% FLUSH
3.0000 mL | Freq: Two times a day (BID) | INTRAVENOUS | Status: DC
  Administered 2024-02-29 – 2024-03-04 (×9): 3 mL via INTRAVENOUS

## 2024-02-28 MED ORDER — CALCIUM GLUCONATE-NACL 1-0.675 GM/50ML-% IV SOLN
1.0000 g | Freq: Once | INTRAVENOUS | Status: AC
  Administered 2024-02-28: 1000 mg via INTRAVENOUS
  Filled 2024-02-28: qty 50

## 2024-02-28 MED ORDER — ONDANSETRON HCL 4 MG/2ML IJ SOLN: 4.0000 mg | Freq: Four times a day (QID) | INTRAMUSCULAR | Status: DC | PRN

## 2024-02-28 NOTE — Assessment & Plan Note (Signed)
 Chronic pain Continue hydrocodone  as needed No longer on methotrexate 

## 2024-02-28 NOTE — Assessment & Plan Note (Addendum)
 Last hemoglobin 10.8

## 2024-02-28 NOTE — Assessment & Plan Note (Addendum)
 Patient on Norvasc 

## 2024-02-28 NOTE — Assessment & Plan Note (Addendum)
 Beta-blocker intolerance Heart rate initially in the high 30s to low 40s Carvedilol  held. Heart rate improved.

## 2024-02-28 NOTE — Telephone Encounter (Signed)
 Patient dropped off document transportation, to be filled out by provider. Patient requested to send it back via Call Patient to pick up within ASAP. Document is located in providers tray at front office.Please advise at Mobile [phone] (mobile)

## 2024-02-28 NOTE — H&P (Signed)
 History and Physical    Patient: Daniel Kline FMW:[id number] DOB: 09-Feb-1946 DOA: 02/28/2024 DOS: the patient was seen and examined on 02/28/2024 PCP: [name], MD  Patient coming from: Home  Chief Complaint:  Chief Complaint  Patient presents with   Bradycardia   Dizziness    HPI: Daniel Kline is a 78 y.o. male with medical history significant for T2DM, CKD IV, anemia of chronic disease, RA, diastolic CHF, nocturnal hypoxemia on nighttime O2 at 2 L, history of hallucinations, being admitted with symptomatic bradycardia(HR 38 with EMS) in the setting of beta-blocker therapy and acute worsening of CKD.  He presented by EMS with a complaint of dizziness and shortness of breath.  He denied chest pain, cough, fever or chills, lower extremity pain or swelling. In the ED heart rate 37-41, maintaining good blood pressure with systolics 113-120 and diastolic in the 60s.  Vitals otherwise unremarkable. Labs notable for troponin 10 and BNP 149 Normal WBC, with hemoglobin near baseline at 9.8 Creatinine 5.11 up from 2.51-month ago with metabolic acidosis of 18 and hyperkalemia 5.2.  Magnesium  normal at 2.2. EKG showed sinus bradycardia at 38 Chest x-ray with mild cardiomegaly and nonacute. Patient was treated with a 500 mL liter NS bolus and given a gram of calcium  gluconate to treat hyperkalemia. Admission requested.     Review of Systems: {ROS_Text:26778}  Past Medical History:  Diagnosis Date   Anemia    H/O   Anxiety    Arthritis    Atrial flutter (HCC)    a. s/p post ablation in 04/2017   Chronic kidney disease    Complication of anesthesia    CTCL (cutaneous T-cell lymphoma) (HCC)    Diabetes mellitus without complication (HCC)    Diastolic dysfunction    a. 03/2022 Echo: EF 60-65%, no rwma, GrI DD, nl RV fxn.   Dysplastic nevus 12/19/2017   Right distal lat. forearm near wrist. Severe atypia, close to peripheral margin.   Dysplastic nevus 06/21/2018   Upper  back right paraspinal. Severe atypia, peripheral margin involved. Excised 07/11/2018, margins free.   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 forearm. SCCis, hypertrophic.   Past Surgical History:  Procedure Laterality Date   BACK SURGERY     LUMBAR   CARDIAC ELECTROPHYSIOLOGY STUDY AND ABLATION  2019   CATARACT EXTRACTION W/PHACO Right 05/05/2022   Procedure: CATARACT EXTRACTION PHACO AND INTRAOCULAR LENS PLACEMENT (IOC) RIGHT;  Surgeon: Mittie Gaskin, MD;  Location: [facility];  Service: Ophthalmology;  Laterality: Right;  Diabetic 10.42 01:13.4   COLONOSCOPY WITH PROPOFOL  N/A 04/04/2019   Procedure: COLONOSCOPY WITH PROPOFOL ;  Surgeon: Toledo, Ladell POUR, MD;  Location: [facility] END[facility];  Service: Gastroenterology;  Laterality: N/A;   ESOPHAGOGASTRODUODENOSCOPY N/A 11/28/2023   Procedure: EGD (ESOPHAGOGASTRODUODENOSCOPY);  Surgeon: Jinny Carmine, MD;  Location: [facility] END[facility];  Service: Endoscopy;  Laterality: N/A;   I & D EXTREMITY Right 02/01/2020   Procedure:  IRRIGATION AND DEBRIDEMENT EXTREMITY with poly exchange;  Surgeon: Mardee Lynwood SQUIBB, MD;  Location: [facility] ORS;  Service: Orthopedics;  Laterality: Right;   INCISION AND DRAINAGE     BACK-MRSA INFECTION AFTER BACK SURGERY   KNEE ARTHROPLASTY Right 01/28/2020   Procedure: COMPUTER ASSISTED TOTAL KNEE ARTHROPLASTY;  Surgeon: Mardee Lynwood SQUIBB, MD;  Location: [facility] ORS;  Service: Orthopedics;  Laterality: Right;   MOUTH SURGERY     right elbow surgery      right knee surgery     right shoulder surgery     from polio damage   TONSILLECTOMY     TOTAL HIP ARTHROPLASTY Bilateral 04/2016   Social History:  reports that he quit smoking about 33 years ago. His smoking use included cigars. He has been exposed to tobacco smoke. He quit smokeless tobacco use about 18 years ago. He reports that he does not drink alcohol and does not use drugs.  Allergies  Allergen Reactions   Bee Venom Anaphylaxis   Oxycodone  Other (See Comments)    Delusions   Hydromorphone  Other (See Comments)    hallucinating   Hydroxychloroquine     Other reaction(s): Other (See Comments) He broke out really badly.   Zolpidem Other (See Comments)    Family History  Adopted: Yes    Prior to Admission medications   Medication Sig Start Date End Date Taking? Authorizing Provider  calcitRIOL  (ROCALTROL ) 0.25 MCG capsule Take 0.25 mcg by mouth daily. 06/15/23 06/14/24  [provider]  carvedilol  (COREG ) 3.125 MG tablet TAKE 1 TABLET BY MOUTH 2 TIMES DAILY WITH A MEAL. 04/21/23   [name], MD  cephALEXin  (KEFLEX ) 500 MG capsule Take 1 capsule (500 mg total) by mouth 2 (two) times daily. 01/20/24   Wendee Lynwood HERO, NP  Cholecalciferol  (VITAMIN D -3) 25 MCG (1000 UT) CAPS Take 1,000 Units by mouth daily.    [provider]  clorazepate  (TRANXENE ) 7.5 MG tablet TAKE 1 TABLET BY MOUTH TWICE A DAY AS NEEDED FOR ANXIETY 12/19/23   [name], MD  cyanocobalamin  (VITAMIN B12) 500 MCG tablet Take 1,000 mcg by mouth daily.    [provider]  EPINEPHrine  0.3 mg/0.3 mL IJ SOAJ injection Inject 0.3 mg into the muscle as needed for anaphylaxis. 01/27/23   [name], MD  Ferrous Sulfate  (IRON PO) Take 1 tablet by mouth at bedtime.    [provider]  folic acid  (FOLVITE ) 1 MG tablet Take 1 mg by mouth daily. 12/19/23   [provider]  furosemide  (LASIX ) 80 MG tablet Take 1 tablet (80 mg total) by mouth daily. 01/23/24   [name], MD  gabapentin  (NEURONTIN ) 100 MG capsule Take 2 capsules (200 mg total) by mouth 3 (three) times daily. 12/31/23   Josette Charlie, MD  glucose blood (ONETOUCH VERIO) test strip Use to check blood sugar once daily 09/27/23   [name], MD  HYDROcodone -acetaminophen  (NORCO/VICODIN) 5-325 MG tablet TAKE ONE TABLET BY MOUTH EVERY 6 HOURS AS NEEDED FOR PAIN 01/20/24   Webb, Padonda B, FNP  hydrocortisone  2.5 % cream Apply topically 3 (three) times daily as needed. 06/17/23   Letvak, Richard I, MD  isosorbide  mononitrate (IMDUR ) 60 MG 24 hr tablet Take 1 tablet (60 mg total) by mouth daily before lunch. 05/11/21   Gollan, Timothy J, MD  latanoprost  (XALATAN ) 0.005 % ophthalmic solution Place 1 drop into both eyes at bedtime.    [provider]  MAGNESIUM  PO Take 1 tablet  by mouth daily.    [provider]  melatonin 5 MG TABS Take 1 tablet (5 mg total) by mouth at bedtime as needed. 12/31/23   Josette Ade, MD  pantoprazole  (PROTONIX ) 40 MG tablet Take 1 tablet (40 mg total) by mouth 2 (two) times daily. 12/12/23 02/22/24  Trudy Anthony HERO, MD  QUEtiapine  (SEROQUEL ) 50 MG tablet Take 1 tablet (50 mg total) by mouth at bedtime. 12/31/23   Josette Ade, MD  rosuvastatin  (CRESTOR ) 10 MG tablet TAKE ONE TABLET BY MOUTH EVERY DAY 01/04/24   Jimmy Ade FERNS, MD    Physical Exam: Vitals:   02/28/24 1818 02/28/24 1819 02/28/24 1900 02/28/24 1929  BP:  120/65 113/60   Pulse: (!) 38  (!) 37   Resp: 18  20   Temp: 98.2 F (36.8 C)     TempSrc: Oral     SpO2:  100% 100%   Weight:    101.6 kg  Height:    5' 6 (1.676 m)   Physical Exam  Labs on Admission: I have personally reviewed following labs and imaging studies  CBC: Recent Labs  Lab 02/28/24 1820  WBC 5.6  NEUTROABS 3.3  HGB 9.8*  HCT 30.4*  MCV 93.0  PLT 177   Basic Metabolic Panel: Recent Labs  Lab 02/28/24 1820  NA 134*  K 5.2*  CL 107  CO2 18*  GLUCOSE 85  BUN 72*  CREATININE 5.11*   CALCIUM  9.2  MG 2.2   GFR: Estimated Creatinine Clearance: 13.1 mL/min (A) (by C-G formula based on SCr of 5.11 mg/dL (H)). Liver Function Tests: Recent Labs  Lab 02/28/24 1820  AST 22  ALT 16  ALKPHOS 51  BILITOT 0.5  PROT 6.5  ALBUMIN 3.3*   No results for input(s): LIPASE, AMYLASE in the last 168 hours. No results for input(s): AMMONIA in the last 168 hours. Coagulation Profile: No results for input(s): INR, PROTIME in the last 168 hours. Cardiac Enzymes: No results for input(s): CKTOTAL, CKMB, CKMBINDEX, TROPONINI in the last 168 hours. BNP (last 3 results) No results for input(s): PROBNP in the last 8760 hours. HbA1C: No results for input(s): HGBA1C in the last 72 hours. CBG: No results for input(s): GLUCAP in the last 168 hours. Lipid Profile: No results for input(s): CHOL, HDL, LDLCALC, TRIG, CHOLHDL, LDLDIRECT in the last 72 hours. Thyroid  Function Tests: No results for input(s): TSH, T4TOTAL, FREET4, T3FREE, THYROIDAB in the last 72 hours. Anemia Panel: No results for input(s): VITAMINB12, FOLATE, FERRITIN, TIBC, IRON, RETICCTPCT in the last 72 hours. Urine analysis:    Component Value Date/Time   COLORURINE STRAW (A) 12/27/2023 1156   APPEARANCEUR CLEAR (A) 12/27/2023 1156   APPEARANCEUR Clear 10/04/2023 1526   LABSPEC 1.009 12/27/2023 1156   LABSPEC 1.021 10/18/2011 0958   PHURINE 6.0 12/27/2023 1156   GLUCOSEU 50 (A) 12/27/2023 1156   GLUCOSEU Negative 10/18/2011 0958   HGBUR NEGATIVE 12/27/2023 1156   BILIRUBINUR NEGATIVE 12/27/2023 1156   BILIRUBINUR Negative 10/04/2023 1526   BILIRUBINUR Negative 10/18/2011 0958   KETONESUR NEGATIVE 12/27/2023 1156   PROTEINUR 30 (A) 12/27/2023 1156   NITRITE NEGATIVE 12/27/2023 1156   LEUKOCYTESUR NEGATIVE 12/27/2023 1156   LEUKOCYTESUR Negative 10/18/2011 0958    Radiological Exams on Admission: DG Chest Port 1 View Result Date: 02/28/2024 CLINICAL  DATA:  Weakness bradycardia EXAM: PORTABLE CHEST 1 VIEW COMPARISON:  12/27/2023 FINDINGS: Mild cardiomegaly. No acute airspace disease, pleural effusion or pneumothorax. Aortic atherosclerosis. Postsurgical changes of the proximal right  humerus. IMPRESSION: No active disease. Mild cardiomegaly. Electronically Signed   By: Luke Bun M.D.   On: 02/28/2024 18:55   Data Reviewed for HPI: Relevant notes from primary care and specialist visits, past discharge summaries as available in EHR, including Care Everywhere. Prior diagnostic testing as pertinent to current admission diagnoses Updated medications and problem lists for reconciliation ED course, including vitals, labs, imaging, treatment and response to treatment Triage notes, nursing and pharmacy notes and ED provider's notes Notable results as noted above in HPI      Assessment and Plan: * Symptomatic sinus bradycardia Beta-blocker intolerance Heart rate in the high 30s to low 40s Patient symptomatic for dizziness Discontinue carvedilol  for now Continuous cardiac monitoring and close monitoring of blood pressure Pacer pads at bedside Assistance with getting up Cardiology consultation requested for the a.m.  Dizziness Likely a combination of symptomatic bradycardia as well as dehydration from diuretic therapy Please see management on the each respective problem. Neurologic checks Assistance with ambulation Fall precautions Can consider PT eval in the a.m.  Acute renal failure superimposed on stage 4 chronic kidney disease (HCC) Acute metabolic acidosis Hyperkalemia Likely secondary to diuretic meds for management of CHF and venous insufficiency Hold diuretics Was treated with an NS bolus in the ED Hyperkalemia treated with calcium  gluconate Will cautiously hydrate with an additional 500 mL NS Nephrology consult   Anemia of chronic disease Hemoglobin at baseline Continue B12, ferrous sulfate  and folic acid   Chronic  diastolic CHF (congestive heart failure) (HCC) Chronic nocturnal hypoxemia Clinically euvolemic to dry Holding Coreg , furosemide  due to bradycardia and AKI respectively Holding Imdur  while monitoring for continued hemodynamic stability in the setting of bradycardia Daily weights as patient is being hydrated Monitor for fluid overload in the setting of hydration Continue supplemental oxygen  Type 2 diabetes mellitus with diabetic nephropathy (HCC) Sliding scale insulin  coverage  Essential hypertension Blood pressure normal to soft Holding oral antihypertensives for now in the setting of bradycardia  Rheumatoid arthritis (HCC) Chronic pain Continue hydrocodone  as needed No longer on methotrexate   History of hallucinations Hospitalized in May 2025 with hallucinations in the setting of lower extremity cellulitis and AKI on CKD Continue quetiapine , prn tranxene  Delirium precautions    DVT prophylaxis: Lovenox   Consults: [facility] cardiology and nephrology  Advance Care Planning:   Code Status: Prior   Family Communication: none***  Disposition Plan: Back to previous home environment  Severity of Illness: The appropriate patient status for this patient is OBSERVATION. Observation status is judged to be reasonable and necessary in order to provide the required intensity of service to ensure the patient's safety. The patient's presenting symptoms, physical exam findings, and initial radiographic and laboratory data in the context of their medical condition is felt to place them at decreased risk for further clinical deterioration. Furthermore, it is anticipated that the patient will be medically stable for discharge from the hospital within 2 midnights of admission.   Author: Delayne LULLA Solian, MD 02/28/2024 8:47 PM  For on call review [email or web address].

## 2024-02-28 NOTE — Hospital Course (Signed)
 SABRA

## 2024-02-28 NOTE — Assessment & Plan Note (Addendum)
 Likely secondary to low heart rate.  Continue working with PT

## 2024-02-28 NOTE — Assessment & Plan Note (Addendum)
 Delirium precaution

## 2024-02-28 NOTE — ED Provider Notes (Signed)
 [facility] Provider Note   Event Date/Time   First MD Initiated Contact with Patient 02/28/24 1811     (approximate) History  Bradycardia and Dizziness HPI Daniel Kline is a 78 y.o. male with a past medical history of CKD, morbid obesity, cutaneous T-cell lymphoma, hypercholesterolemia, type 2 diabetes, and diastolic CHF who presents complaining of exertional lightheadedness as well as associated dyspnea but is been present over the last 24 hours.  Patient denies any recent medication changes.  Patient denies any associated chest pain.  Patient denies any recent viral symptoms such as cough, sore throat, runny nose, or nausea/vomiting/diarrhea.  Patient denies any outdoor activities that would lend to tick bites. ROS: Patient currently denies any vision changes, tinnitus, difficulty speaking, facial droop, sore throat, chest pain, shortness of breath, abdominal pain, nausea/vomiting/diarrhea, dysuria, or weakness/numbness/paresthesias in any extremity   Physical Exam  Triage Vital Signs: ED Triage Vitals  Encounter Vitals Group     BP 02/28/24 1819 120/65     Girls Systolic BP Percentile --      Girls Diastolic BP Percentile --      Boys Systolic BP Percentile --      Boys Diastolic BP Percentile --      Pulse Rate 02/28/24 1818 (!) 38     Resp 02/28/24 1818 18     Temp 02/28/24 1818 98.2 F (36.8 C)     Temp Source 02/28/24 1818 Oral     SpO2 02/28/24 1819 100 %     Weight --      Height --      Head Circumference --      Peak Flow --      Pain Score 02/28/24 1818 7     Pain Loc --      Pain Education --      Exclude from Growth Chart --    Most recent vital signs: Vitals:   02/28/24 1819 02/28/24 1900  BP: 120/65 113/60  Pulse:  (!) 37  Resp:  20  Temp:    SpO2: 100% 100%   General: Awake, oriented x4. CV:  Good peripheral perfusion. Resp:  Normal effort. Abd:  No distention. Other:  Elderly obese Caucasian male resting comfortably in  no acute distress ED Results / Procedures / Treatments  Labs (all labs ordered are listed, but only abnormal results are displayed) Labs Reviewed  COMPREHENSIVE METABOLIC PANEL WITH GFR - Abnormal; Notable for the following components:      Result Value   Sodium 134 (*)    Potassium 5.2 (*)    CO2 18 (*)    BUN 72 (*)    Creatinine, Ser 5.11 (*)    Albumin 3.3 (*)    GFR, Estimated 11 (*)    All other components within normal limits  CBC WITH DIFFERENTIAL/PLATELET - Abnormal; Notable for the following components:   RBC 3.27 (*)    Hemoglobin 9.8 (*)    HCT 30.4 (*)    All other components within normal limits  BRAIN NATRIURETIC PEPTIDE - Abnormal; Notable for the following components:   B Natriuretic Peptide 149.1 (*)    All other components within normal limits  MAGNESIUM   TROPONIN I (HIGH SENSITIVITY)  TROPONIN I (HIGH SENSITIVITY)   EKG ED ECG REPORT I, Artist MARLA Kerns, the attending physician, personally viewed and interpreted this ECG. Date: 02/28/2024 EKG Time: 1815 Rate: 38 Rhythm: Bradycardic sinus rhythm QRS Axis: normal Intervals: normal ST/T Wave abnormalities: normal Narrative Interpretation:  Bradycardic sinus rhythm.  No evidence of acute ischemia RADIOLOGY ED MD interpretation: One-view portable chest x-ray interpreted by me shows no evidence of acute abnormalities including no pneumonia, pneumothorax, or widened mediastinum - All radiology independently interpreted and agree with radiology assessment Official radiology report(s): DG Chest Port 1 View Result Date: 02/28/2024 CLINICAL DATA:  Weakness bradycardia EXAM: PORTABLE CHEST 1 VIEW COMPARISON:  12/27/2023 FINDINGS: Mild cardiomegaly. No acute airspace disease, pleural effusion or pneumothorax. Aortic atherosclerosis. Postsurgical changes of the proximal right humerus. IMPRESSION: No active disease. Mild cardiomegaly. Electronically Signed   By: Luke Bun M.D.   On: 02/28/2024 18:55    PROCEDURES: Critical Care performed: No Procedures MEDICATIONS ORDERED IN ED: Medications  sodium chloride  0.9 % bolus 500 mL (has no administration in time range)  calcium  gluconate 1 g/ 50 mL sodium chloride  IVPB (has no administration in time range)   IMPRESSION / MDM / ASSESSMENT AND PLAN / ED COURSE  I reviewed the triage vital signs and the nursing notes.                             The patient is on the cardiac monitor to evaluate for evidence of arrhythmia and/or significant heart rate changes. Patient's presentation is most consistent with acute presentation with potential threat to life or bodily function. The patient is suffering from bradycardia without concerning signs of instability on exam such as altered mental status, hypotension, evidence of cardiac end organ dysfunction, or acute heart failure.  Ddx: sick sinus syndrome, vasovagal, unstable heart block (ekg with no signs of Mobitz II, complete heart block), right coronary artery myocardial infarction (neg trop x1, non STEMI, no chest pain), infection (afebrile, no leukocytosis, no recent illness), hypothyroidism, hyperkalemia, hypoglycemia, dehydration, or intoxication (beta blockade, calcium  channel blockade, clonidine, digoxin, opiates, alcohol or other). Workup: CBC, CMP, Trop, EKG, telemetry Results: CBC hemoglobin 9.8 CMP creatinine 5.11 (4.08, 2.6), potassium 5.2 Trop Neg x1 EKG interpreted by me and shows sinus bradycardia  Consult: I spoke to the on-call hospitalist, Dr. Cleatus, who has graciously agreed to accept this patient onto their service for further evaluation and management  Dispo: Admit to medicine   FINAL CLINICAL IMPRESSION(S) / ED DIAGNOSES   Final diagnoses:  Symptomatic bradycardia  Acute kidney injury superimposed on chronic kidney disease (HCC)   Rx / DC Orders   ED Discharge Orders     None      Note:  This document was prepared using Dragon voice recognition software and may  include unintentional dictation errors.   Jossie Artist POUR, MD 02/28/24 2027

## 2024-02-28 NOTE — Telephone Encounter (Signed)
 This is not a simple transportation form. This is a [employer] Review Program form.   Form placed in Dr Marval inbox on his desk to decide if pt needs an OV to go over them.

## 2024-02-28 NOTE — Assessment & Plan Note (Signed)
 Sliding scale insulin coverage

## 2024-02-28 NOTE — Assessment & Plan Note (Addendum)
 Acute metabolic acidosis Hyperkalemia Patient on gentle fluids.  Nephrology following.  Creatinine upon presentation 5.11 and down to 4.10.

## 2024-02-28 NOTE — Assessment & Plan Note (Addendum)
 Chronic nocturnal hypoxemia Watch closely with IV fluid hydration.  Last EF 60%. Holding Coreg , furosemide  due to bradycardia and AKI respectively

## 2024-02-28 NOTE — ED Triage Notes (Signed)
 EMS call from home, States that he has not been feeling right, on EMS arrival HR 38. C/o dizziness/ SHOB. Was seen at his PCP today for follow up blood work

## 2024-02-29 ENCOUNTER — Encounter: Payer: Self-pay | Admitting: Internal Medicine

## 2024-02-29 DIAGNOSIS — Z87898 Personal history of other specified conditions: Secondary | ICD-10-CM | POA: Diagnosis not present

## 2024-02-29 DIAGNOSIS — I5032 Chronic diastolic (congestive) heart failure: Secondary | ICD-10-CM | POA: Diagnosis not present

## 2024-02-29 DIAGNOSIS — I10 Essential (primary) hypertension: Secondary | ICD-10-CM | POA: Diagnosis not present

## 2024-02-29 DIAGNOSIS — T447X5S Adverse effect of beta-adrenoreceptor antagonists, sequela: Secondary | ICD-10-CM | POA: Diagnosis not present

## 2024-02-29 DIAGNOSIS — N178 Other acute kidney failure: Secondary | ICD-10-CM | POA: Diagnosis not present

## 2024-02-29 DIAGNOSIS — R0602 Shortness of breath: Secondary | ICD-10-CM

## 2024-02-29 DIAGNOSIS — M7989 Other specified soft tissue disorders: Secondary | ICD-10-CM

## 2024-02-29 DIAGNOSIS — I872 Venous insufficiency (chronic) (peripheral): Secondary | ICD-10-CM | POA: Diagnosis present

## 2024-02-29 DIAGNOSIS — D638 Anemia in other chronic diseases classified elsewhere: Secondary | ICD-10-CM | POA: Diagnosis not present

## 2024-02-29 DIAGNOSIS — E875 Hyperkalemia: Secondary | ICD-10-CM | POA: Diagnosis not present

## 2024-02-29 DIAGNOSIS — M069 Rheumatoid arthritis, unspecified: Secondary | ICD-10-CM | POA: Diagnosis present

## 2024-02-29 DIAGNOSIS — T447X5D Adverse effect of beta-adrenoreceptor antagonists, subsequent encounter: Secondary | ICD-10-CM | POA: Diagnosis not present

## 2024-02-29 DIAGNOSIS — E1122 Type 2 diabetes mellitus with diabetic chronic kidney disease: Secondary | ICD-10-CM | POA: Diagnosis present

## 2024-02-29 DIAGNOSIS — N179 Acute kidney failure, unspecified: Secondary | ICD-10-CM | POA: Diagnosis not present

## 2024-02-29 DIAGNOSIS — Z8616 Personal history of COVID-19: Secondary | ICD-10-CM | POA: Diagnosis not present

## 2024-02-29 DIAGNOSIS — I130 Hypertensive heart and chronic kidney disease with heart failure and stage 1 through stage 4 chronic kidney disease, or unspecified chronic kidney disease: Secondary | ICD-10-CM | POA: Diagnosis present

## 2024-02-29 DIAGNOSIS — G4734 Idiopathic sleep related nonobstructive alveolar hypoventilation: Secondary | ICD-10-CM | POA: Diagnosis not present

## 2024-02-29 DIAGNOSIS — N184 Chronic kidney disease, stage 4 (severe): Secondary | ICD-10-CM | POA: Diagnosis not present

## 2024-02-29 DIAGNOSIS — E1121 Type 2 diabetes mellitus with diabetic nephropathy: Secondary | ICD-10-CM | POA: Diagnosis not present

## 2024-02-29 DIAGNOSIS — C8590 Non-Hodgkin lymphoma, unspecified, unspecified site: Secondary | ICD-10-CM

## 2024-02-29 DIAGNOSIS — R001 Bradycardia, unspecified: Secondary | ICD-10-CM | POA: Diagnosis not present

## 2024-02-29 DIAGNOSIS — I4892 Unspecified atrial flutter: Secondary | ICD-10-CM | POA: Diagnosis present

## 2024-02-29 DIAGNOSIS — G8929 Other chronic pain: Secondary | ICD-10-CM | POA: Diagnosis present

## 2024-02-29 DIAGNOSIS — E7800 Pure hypercholesterolemia, unspecified: Secondary | ICD-10-CM | POA: Diagnosis present

## 2024-02-29 DIAGNOSIS — T502X5A Adverse effect of carbonic-anhydrase inhibitors, benzothiadiazides and other diuretics, initial encounter: Secondary | ICD-10-CM | POA: Diagnosis present

## 2024-02-29 DIAGNOSIS — I272 Pulmonary hypertension, unspecified: Secondary | ICD-10-CM | POA: Diagnosis present

## 2024-02-29 DIAGNOSIS — I129 Hypertensive chronic kidney disease with stage 1 through stage 4 chronic kidney disease, or unspecified chronic kidney disease: Secondary | ICD-10-CM | POA: Diagnosis not present

## 2024-02-29 DIAGNOSIS — R0902 Hypoxemia: Secondary | ICD-10-CM | POA: Diagnosis present

## 2024-02-29 DIAGNOSIS — H409 Unspecified glaucoma: Secondary | ICD-10-CM | POA: Diagnosis present

## 2024-02-29 DIAGNOSIS — G4733 Obstructive sleep apnea (adult) (pediatric): Secondary | ICD-10-CM | POA: Diagnosis present

## 2024-02-29 DIAGNOSIS — D631 Anemia in chronic kidney disease: Secondary | ICD-10-CM | POA: Diagnosis not present

## 2024-02-29 DIAGNOSIS — N189 Chronic kidney disease, unspecified: Secondary | ICD-10-CM | POA: Diagnosis not present

## 2024-02-29 DIAGNOSIS — E860 Dehydration: Secondary | ICD-10-CM | POA: Diagnosis present

## 2024-02-29 DIAGNOSIS — R0609 Other forms of dyspnea: Secondary | ICD-10-CM | POA: Diagnosis not present

## 2024-02-29 DIAGNOSIS — F419 Anxiety disorder, unspecified: Secondary | ICD-10-CM | POA: Diagnosis present

## 2024-02-29 DIAGNOSIS — E8721 Acute metabolic acidosis: Secondary | ICD-10-CM

## 2024-02-29 DIAGNOSIS — C84A0 Cutaneous T-cell lymphoma, unspecified, unspecified site: Secondary | ICD-10-CM | POA: Diagnosis present

## 2024-02-29 LAB — BASIC METABOLIC PANEL WITH GFR
Anion gap: 9 (ref 5–15)
BUN: 69 mg/dL — ABNORMAL HIGH (ref 8–23)
CO2: 20 mmol/L — ABNORMAL LOW (ref 22–32)
Calcium: 9.2 mg/dL (ref 8.9–10.3)
Chloride: 107 mmol/L (ref 98–111)
Creatinine, Ser: 5.12 mg/dL — ABNORMAL HIGH (ref 0.61–1.24)
GFR, Estimated: 11 mL/min — ABNORMAL LOW (ref >60)
Glucose, Bld: 169 mg/dL — ABNORMAL HIGH (ref 70–99)
Potassium: 4.5 mmol/L (ref 3.5–5.1)
Sodium: 136 mmol/L (ref 135–145)

## 2024-02-29 LAB — CBG MONITORING, ED
Glucose-Capillary: 101 mg/dL — ABNORMAL HIGH (ref 70–99)
Glucose-Capillary: 119 mg/dL — ABNORMAL HIGH (ref 70–99)
Glucose-Capillary: 119 mg/dL — ABNORMAL HIGH (ref 70–99)
Glucose-Capillary: 159 mg/dL — ABNORMAL HIGH (ref 70–99)
Glucose-Capillary: 93 mg/dL (ref 70–99)

## 2024-02-29 MED ORDER — ISOSORBIDE MONONITRATE ER 60 MG PO TB24
60.0000 mg | ORAL_TABLET | Freq: Every day | ORAL | Status: DC
  Administered 2024-03-01 – 2024-03-04 (×4): 60 mg via ORAL
  Filled 2024-02-29 (×4): qty 1

## 2024-02-29 MED ORDER — HEPARIN SODIUM (PORCINE) 5000 UNIT/ML IJ SOLN
5000.0000 [IU] | Freq: Three times a day (TID) | INTRAMUSCULAR | Status: DC
  Administered 2024-02-29 – 2024-03-04 (×11): 5000 [IU] via SUBCUTANEOUS
  Filled 2024-02-29 (×11): qty 1

## 2024-02-29 MED ORDER — AMLODIPINE BESYLATE 10 MG PO TABS
10.0000 mg | ORAL_TABLET | Freq: Every day | ORAL | Status: DC
  Administered 2024-02-29 – 2024-03-04 (×5): 10 mg via ORAL
  Filled 2024-02-29: qty 2
  Filled 2024-02-29 (×2): qty 1
  Filled 2024-02-29: qty 2
  Filled 2024-02-29: qty 1

## 2024-02-29 MED ORDER — LATANOPROST 0.005 % OP SOLN
1.0000 [drp] | Freq: Every day | OPHTHALMIC | Status: DC
  Filled 2024-02-29: qty 2.5

## 2024-02-29 NOTE — TOC CM/SW Note (Signed)
..  Transition of Care [facility]) - Inpatient Brief Assessment   Patient Details  Name: Daniel Kline MRN: [id number] Date of Birth: 1946/06/19  [facility] of [facility]) CM/SW Contact:    [name] [name], LCSW Phone Number: 02/29/2024, 4:13 PM   Clinical Narrative:  SW reviewed pt chart.  SW met with pt at bedside.  SW expressed to pt that PT eval is recommending PT services.  SW asked pt if he would like to receive PT at home or at facility.  Pt stated that he was receiving [name] before ED visit but unsure of name.  Pt gave sw verbal permission to speak with his wife regarding placement.  SW contacted wife.  Wife stated that [name] was the [facility] agency that pt was using before ED visit and she would like for him to continue PT at home.   Transition of Care Asessment:

## 2024-02-29 NOTE — ED Notes (Signed)
 Assumed care of pt. Report received from previous RN. Pt alert and oriented. Pt on CCM and pulse ox at this time. Pt RR even and unlabored. Pt denies any needs at this time.

## 2024-02-29 NOTE — Progress Notes (Signed)
 [facility]  - [facility]   PATIENT NAME: Daniel Kline    MR#:  [id number]  DATE OF BIRTH:  1945/09/01  SUBJECTIVE:  no family at bedside. Patient came in with dizziness and was found to have low heartrate. Currently heartrate in the 50-to 60s. Blood pressure bit on the higher side. Held beta-blocker.    VITALS:  Blood pressure (!) 146/71, pulse (!) 55, temperature 97.8 F (36.6 C), temperature source Oral, resp. rate 18, height 5' 6 (1.676 m), weight 101.6 kg, SpO2 99%.  PHYSICAL EXAMINATION:   GENERAL:  78 y.o.-year-old patient with no acute distress. Morbidly obese LUNGS: Normal breath sounds bilaterally, no wheezing CARDIOVASCULAR: S1, S2 normal. No murmur   ABDOMEN: Soft, nontender, nondistended. Bowel sounds present.  EXTREMITIES: +  edema b/l.    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 RADIOLOGY:  DG Chest Port 1 View Result Date: 02/28/2024 CLINICAL DATA:  Weakness bradycardia EXAM: PORTABLE CHEST 1 VIEW COMPARISON:  12/27/2023 FINDINGS: Mild cardiomegaly. No acute airspace disease, pleural effusion or pneumothorax. Aortic atherosclerosis. Postsurgical changes of the proximal right humerus. IMPRESSION: No active disease. Mild cardiomegaly. Electronically Signed   By: Luke Bun M.D.   On: 02/28/2024 18:55    Assessment and Plan  Per H and P Daniel Kline is a 78 y.o. male  with medical history significant for T2DM, CKD IV, anemia of chronic disease, RA, diastolic CHF, nocturnal hypoxemia on nighttime O2 at 2 L, history of hallucinations, being admitted with symptomatic bradycardia(HR 27 with EMS) and acute worsening of CKD.  He takes Coreg  3.125 mg twice daily.  He presented by EMS with a complaint of dizziness and shortness of breath.  Most of history given by wife at bedside who stated that he was in his usual state of health until the morning of arrival when she noted that he did not seem to have an appetite then developed presenting symptoms later on in the day.    Creatinine 5.11 up from 2.76-month ago with metabolic acidosis of 18 and hyperkalemia 5.2.   Symptomatic sinus bradycardia Beta-blocker intolerance ---Heart rate in the high 30s to low 40s--HR now 55-64 --Patient symptomatic for dizziness --Discontinue carvedilol  for now --Pacer pads at bedside --[facility] consultation noted. No indication for pacemaker present. Continue to monitor   Dizziness --Likely a combination of symptomatic bradycardia as well as dehydration from diuretic therapy   Acute renal failure superimposed on stage 4 chronic kidney disease (HCC) Acute metabolic acidosis Hyperkalemia --Likely secondary to diuretic meds for management of CHF and venous insufficiency --Hold diuretics --Was treated with an NS bolus in the ED --Hyperkalemia treated with calcium  gluconate --Will cautiously hydrate with an additional 500 mL NS --Nephrology consulted   Anemia  of chronic disease --Hemoglobin at baseline --Continue B12, ferrous sulfate  and folic acid    Chronic diastolic CHF (congestive heart failure) (HCC) Chronic nocturnal hypoxemia HTN --Clinically euvolemic to dry --Holding Coreg , furosemide  due to bradycardia and AKI respectively --resume imdur  and amlodipine   --Continue supplemental oxygen   Type 2 diabetes mellitus with diabetic nephropathy (HCC) --Sliding scale  insulin  coverage   Essential hypertension --resumed amlodipine  and Imdur    Rheumatoid arthritis (HCC) Chronic pain --Continue hydrocodone  as needed --No longer on methotrexate    H/o Cutaneous T cell lymphoma --pt has not seen his Dermatologist since 10/24 --has cutaneous lesion with scabs and picked lesions   Procedures: Family communication :none today Consults : nephrology, cardiology CODE STATUS: full DVT Prophylaxis : SCD Level of care: Progressive Status is: Inpatient Remains inpatient appropriate because: progression of chronic kidney disease, bradycardia    TOTAL TIME TAKING CARE OF THIS PATIENT: 40 minutes.  >50% time spent on counselling and coordination of care  Note: This dictation was prepared with Dragon dictation along with smaller phrase technology. Any transcriptional errors that result from this process are unintentional.  Leita Blanch M.D    [facility]   CC: Primary care physician; [name], MD

## 2024-02-29 NOTE — Progress Notes (Signed)
 Physical Therapy Evaluation Patient Details Name: Daniel Kline MRN: [id number] DOB: [birth year] Today's Date: 02/29/2024  History of Present Illness  Pt is a 78 y/o male admitted secondary to dizziness and symptomatic bradycardia. Cardiology and Nephrology have been consulted. PMH including but not limited to T2DM, CKD IV, anemia of chronic disease, RA, diastolic CHF, nocturnal hypoxemia on nighttime O2 at 2 L, history of hallucinations.  Clinical Impression  Pt presented supine in bed with HOB elevated, awake and willing to participate in therapy session. Prior to admission, pt reported that he ambulated with use of a cane and required some assistance with LB dressing from spouse. Pt lives with his wife in a single level home with four steps to enter. At the time of evaluation, pt significantly limited with functional mobility secondary to weakness, fatigue and increased dyspnea. Pt's HR stable throughout in the 50's. SpO2 maintaining in mid to upper 90's throughout with pt on RA. BP was assessed in supine (167/86) and in sitting (169/70), and pt asymptomatic throughout. He required mod A for bed mobility, min A for transfers and min A to take a few side steps at edge of stretcher with use of RW. Pt unable to tolerate any further mobility at this time. Based on pt's current functional mobility status, PT recommending short-term rehab (<3hrs/day) to maximize his safety and independence with functional mobility prior to returning home with wife's support. PT will continue to f/u with pt acutely to progress mobility as tolerated.       If plan is discharge home, recommend the following: A lot of help with walking and/or transfers;Two people to help with walking and/or transfers;A lot of help with bathing/dressing/bathroom;Help with stairs or ramp for entrance;Assist for transportation;Assistance with cooking/housework   Can travel by private vehicle   No    Equipment Recommendations None  recommended by PT  Recommendations for Other Services       Functional Status Assessment Patient has had a recent decline in their functional status and demonstrates the ability to make significant improvements in function in a reasonable and predictable amount of time.     Precautions / Restrictions Precautions Precautions: Fall Recall of Precautions/Restrictions: Intact Restrictions Weight Bearing Restrictions Per Provider Order: No      Mobility  Bed Mobility Overal bed mobility: Needs Assistance Bed Mobility: Supine to Sit, Sit to Supine     Supine to sit: Mod assist Sit to supine: Contact guard assist   General bed mobility comments: increased time and effort needed, mod A needed for trunk elevation to achieve an upright sitting position at edge of stretcher    Transfers Overall transfer level: Needs assistance Equipment used: Rolling walker (2 wheels) Transfers: Sit to/from Stand Sit to Stand: Contact guard assist           General transfer comment: pt able to complete sit<>stand from edge of stretcher with CGA and use of RW    Ambulation/Gait Ambulation/Gait assistance: Min assist   Assistive device: Rolling walker (2 wheels)         General Gait Details: pt only able to tolerate taking a few side steps (3-4) at edge of stretcher towards his L side, pt using posterior aspects of his bilateral LEs for additional support against stretcher, min A and RW also needed for support; pt with increased dyspnea with standing and activity, limited secondary to fatigue  Stairs            Wheelchair Mobility     Tilt Bed  Modified Rankin (Stroke Patients Only)       Balance Overall balance assessment: Needs assistance, History of Falls Sitting-balance support: Feet supported Sitting balance-Leahy Scale: Fair     Standing balance support: During functional activity, Reliant on assistive device for balance, Bilateral upper extremity  supported Standing balance-Leahy Scale: Poor                               Pertinent Vitals/Pain Pain Assessment Pain Assessment: No/denies pain    Home Living Family/patient expects to be discharged to:: Private residence Living Arrangements: Spouse/significant other Available Help at Discharge: Family;Available 24 hours/day Type of Home: House Home Access: Stairs to enter Entrance Stairs-Rails: Right;Left;Can reach both Entrance Stairs-Number of Steps: 4   Home Layout: One level Home Equipment: Cane - single Librarian, academic (2 wheels);BSC/3in1      Prior Function Prior Level of Function : Needs assist             Mobility Comments: pt reported that he uses a cane as needed ADLs Comments: wife assist with LB dressing. 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 fully extend R elbow, limited movement    Lower Extremity Assessment Lower Extremity Assessment: Generalized weakness       Communication   Communication Communication: No apparent difficulties    Cognition Arousal: Alert Behavior During Therapy: WFL for tasks assessed/performed   PT - Cognitive impairments: No apparent impairments                         Following commands: Intact       Cueing Cueing Techniques: Verbal cues     General Comments      Exercises     Assessment/Plan    PT Assessment Patient needs continued PT services  PT Problem List Decreased strength;Decreased range of motion;Decreased activity tolerance;Decreased balance;Decreased mobility;Decreased coordination;Decreased knowledge of use of DME;Decreased safety awareness;Decreased knowledge of precautions;Cardiopulmonary status limiting activity       PT Treatment Interventions Gait training;DME instruction;Functional  mobility training;Stair training;Therapeutic activities;Therapeutic exercise;Balance training;Neuromuscular re-education;Patient/family education    PT Goals (Current goals can be found in the Care Plan section)  Acute Rehab PT Goals Patient Stated Goal: to feel better PT Goal Formulation: With patient Time For Goal Achievement: 03/14/24 Potential to Achieve Goals: Good    Frequency Min 3X/week     Co-evaluation               AM-PAC PT 6 Clicks Mobility  Outcome Measure Help needed turning from your back to your side while in a flat bed without using bedrails?: A Little Help needed moving from lying on your back to sitting on the side of a flat bed without using bedrails?: A Lot Help needed moving to and from a bed to a chair (including a wheelchair)?: A Little Help needed standing up from a chair using your arms (e.g., wheelchair or bedside chair)?: A Little Help needed to walk in hospital room?: A Lot Help needed climbing 3-5 steps with a railing? : Total 6 Click Score: 14    End of Session Equipment Utilized During Treatment: Gait belt Activity Tolerance: Patient limited by fatigue Patient left: with call bell/phone within reach;Other (comment) (on stretcher in ED) Nurse Communication: Mobility status PT Visit  Diagnosis: Other abnormalities of gait and mobility (R26.89)    Time: 8977-8895 PT Time Calculation (min) (ACUTE ONLY): 42 min   Charges:   PT Evaluation $PT Eval Moderate Complexity: 1 Mod PT Treatments $Therapeutic Activity: 23-37 mins PT General Charges $$ ACUTE PT VISIT: 1 Visit         Delon DELENA KLEIN, DPT  Acute Rehabilitation Services Office [phone]   Delon HERO Vasilisa Vore 02/29/2024, 12:13 PM

## 2024-02-29 NOTE — Care Management Obs Status (Signed)
 MEDICARE OBSERVATION STATUS NOTIFICATION   Patient Details  Name: Daniel Kline MRN: [id number] Date of Birth: August 16, 1945   Medicare Observation Status Notification Given:  Chaney BRANDY CHRISTIANE LELON, CMA 02/29/2024, 2:19 PM

## 2024-02-29 NOTE — ED Notes (Signed)
Lunch tray given to pt at this time.

## 2024-02-29 NOTE — Consult Note (Signed)
 Cardiology Consultation   Patient ID: EPIC TRIBBETT MRN: [id number]; DOB: [birth year]  Admit date: 02/28/2024 Date of Consult: 02/29/2024  PCP:  [name], MD   [facility] Providers Cardiologist:  Evalene Lunger, MD        Patient Profile: Daniel Kline is a 78 y.o. male with a hx of atrial flutter s/p ablation in 04/2017, diastolic dysfunction, cutaneous T cell lymphoma, T2Dm, CKD IV, hypertension, hyperlipidemia, chronic low back pain, and OSA on CPAP who is being seen 02/29/2024 for the evaluation of symptomatic bradycardia at the request of Dr. Cleatus.  History of Present Illness:   Mr. Divis was diagnosed with atrial flutter at the time of presentation for knee surgery at outside hospital in 2018.  Echo at that time showed EF of 60 to 65% with LVH, and mild pulmonary hypertension.  He subsequently underwent successful catheter ablation for atrial flutter and 04/2017 and remained off oral anticoagulation.  He established care with our office 01/2019 in the setting of preoperative risk stratification and subsequently underwent right total knee arthroplasty with complicated postoperative course including bleeding requiring arthrotomy, evacuation of hematoma, and I&D.  In 01/2022, he was admitted to the hospital with neutropenic sepsis secondary to COVID.  During admission, there was concern for possible atrial fibrillation on twelve-lead EKG and he was started on apixaban .  Cardiology was consulted and upon our review, his rhythm was felt to be consistent with sinus rhythm and PACs, and Eliquis  was discontinued.  Echo 03/2022 showed EF 60 to 65%, G1 DD.    Patient reports that yesterday morning, he noticed decreased appetite.  He subsequently started feeling increased dizziness with associated shortness of breath.  He called EMS who found him to have heart rate of 38 bpm.  In the ED, HR 38 bpm with otherwise normal vital signs.  Pertinent labs include K5.2, CO2 18, BUN 72,  creatinine 5.11, BNP 149, troponin negative x 2, Hgb 9.8, HCT 30.4.  Chest x-ray showed mild cardiomegaly with no active disease.  Twelve-lead EKG showed sinus bradycardia, rate 38 bpm cardiology was asked to consult for further evaluation of symptomatic bradycardia.  Upon cardiology consult, patient is resting comfortably.  He does not endorse some abdominal distention.  He reports improvements in dizziness and shortness of breath.  He denies chest pain, palpitations, and lower extremity swelling.  He denies recent illness.  No bleeding or hematochezia.  Past Medical History:  Diagnosis Date   Anemia    H/O   Anxiety    Arthritis    Atrial flutter (HCC)    a. s/p post ablation in 04/2017   Chronic kidney disease    Complication of anesthesia    CTCL (cutaneous T-cell lymphoma) (HCC)    Diabetes mellitus without complication (HCC)    Diastolic dysfunction    a. 03/2022 Echo: EF 60-65%, no rwma, GrI DD, nl RV fxn.   Dysplastic nevus 12/19/2017   Right distal lat. forearm near wrist. Severe atypia, close to peripheral margin.   Dysplastic nevus 06/21/2018   Upper back right paraspinal. Severe atypia, peripheral margin involved. Excised 07/11/2018, margins free.   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 forearm. SCCis, hypertrophic.    Past Surgical History:  Procedure Laterality Date   BACK SURGERY     LUMBAR   CARDIAC ELECTROPHYSIOLOGY STUDY AND ABLATION  2019   CATARACT EXTRACTION W/PHACO Right 05/05/2022    Procedure: CATARACT EXTRACTION PHACO AND INTRAOCULAR LENS PLACEMENT (IOC) RIGHT;  Surgeon: Mittie Gaskin, MD;  Location: [facility];  Service: Ophthalmology;  Laterality: Right;  Diabetic 10.42 01:13.4   COLONOSCOPY WITH PROPOFOL  N/A 04/04/2019   Procedure: COLONOSCOPY WITH PROPOFOL ;  Surgeon: Toledo, Ladell POUR, MD;  Location: [facility];  Service: Gastroenterology;  Laterality: N/A;   ESOPHAGOGASTRODUODENOSCOPY N/A 11/28/2023   Procedure: EGD (ESOPHAGOGASTRODUODENOSCOPY);  Surgeon: Jinny Carmine, MD;  Location: [facility];  Service: Endoscopy;  Laterality: N/A;   I & D EXTREMITY Right 02/01/2020   Procedure: IRRIGATION AND DEBRIDEMENT EXTREMITY with poly exchange;  Surgeon: Mardee Lynwood SQUIBB, MD;  Location: [facility];  Service: Orthopedics;  Laterality: Right;   INCISION AND DRAINAGE     BACK-MRSA INFECTION AFTER BACK SURGERY   KNEE ARTHROPLASTY Right 01/28/2020   Procedure: COMPUTER ASSISTED TOTAL KNEE ARTHROPLASTY;  Surgeon: Mardee Lynwood SQUIBB, MD;  Location: [facility];  Service: Orthopedics;  Laterality: Right;   MOUTH SURGERY     right elbow surgery     right knee surgery     right shoulder surgery     from polio damage   TONSILLECTOMY     TOTAL HIP ARTHROPLASTY Bilateral 04/2016       Scheduled Meds:  calcitRIOL   0.25 mcg Oral Daily   cholecalciferol   1,000 Units Oral Daily   cyanocobalamin   1,000 mcg Oral Daily   ferrous sulfate   325 mg Oral QHS   folic acid   1 mg Oral Daily   heparin  injection (subcutaneous)  5,000 Units Subcutaneous Q8H   insulin  aspart  0-15 Units Subcutaneous TID WC   insulin  aspart  0-5 Units Subcutaneous QHS   pantoprazole   40 mg Oral BID   QUEtiapine   50 mg Oral QHS   rosuvastatin   10 mg Oral Daily   sodium chloride  flush  3 mL Intravenous Q12H   Continuous Infusions:  PRN Meds: acetaminophen  **OR** acetaminophen , clorazepate , HYDROcodone -acetaminophen , melatonin, ondansetron  **OR** ondansetron  (ZOFRAN ) IV  Allergies:    Allergies   Allergen Reactions   Bee Venom Anaphylaxis   Oxycodone  Other (See Comments)    Delusions   Hydromorphone  Other (See Comments)    hallucinating   Hydroxychloroquine     Other reaction(s): Other (See Comments) He broke out really badly.   Zolpidem Other (See Comments)    Social History:   Social History   Socioeconomic History   Marital status: Married    Spouse name: Not on file   Number of children: 0   Years of education: Not on file   Highest education level: Not on file  Occupational History   Occupation: Corporate investment banker    Comment: 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   Occupation: Engineering geologist    Comment: Retired   Occupation: Scientist, research (medical)    Comment: Retired  Tobacco Use   Smoking status: Former    Types: Cigars    Quit date: 09/23/1990    Years since quitting: 33.4    Passive exposure: Past (as a child)   Smokeless  tobacco: Former    Quit date: 02/25/2006  Vaping Use   Vaping status: Never Used  Substance and Sexual Activity   Alcohol use: No    Alcohol/week: 0.0 standard drinks of alcohol   Drug use: No   Sexual activity: Yes    Partners: Female  Other Topics Concern   Not on file  Social History Narrative   Has living will   Wife is health care POA---then brother or sister   Would accept resuscitation attempts but no prolonged ventilation or tube feeds   Social Drivers of Health   Financial Resource Strain: Low Risk  (07/20/2017)   Overall Financial Resource Strain (CARDIA)    Difficulty of Paying Living Expenses: Not hard at all  Food Insecurity: No Food Insecurity (01/05/2024)   Hunger Vital Sign    Worried About Running Out of Food in the Last Year: Never true    Ran Out of Food in the Last Year: Never true  Transportation Needs: No Transportation Needs (01/05/2024)   PRAPARE - Administrator, Civil Service (Medical): No    Lack of Transportation (Non-Medical): No  Physical Activity: Not on file  Stress:  Not on file  Social Connections: Patient Unable To Answer (12/27/2023)   Social Connection and Isolation Panel    Frequency of Communication with Friends and Family: Patient unable to answer    Frequency of Social Gatherings with Friends and Family: Patient unable to answer    Attends Religious Services: Patient unable to answer    Active Member of Clubs or Organizations: Patient unable to answer    Attends Banker Meetings: Patient unable to answer    Marital Status: Patient unable to answer  Recent Concern: Social Connections - Moderately Isolated (11/26/2023)   Social Connection and Isolation Panel    Frequency of Communication with Friends and Family: More than three times a week    Frequency of Social Gatherings with Friends and Family: Once a week    Attends Religious Services: Never    Database administrator or Organizations: No    Attends Banker Meetings: Never    Marital Status: Married  Catering manager Violence: Patient Unable To Answer (01/05/2024)   Humiliation, Afraid, Rape, and Kick questionnaire    Fear of Current or Ex-Partner: Patient unable to answer    Emotionally Abused: Patient unable to answer    Physically Abused: Patient unable to answer    Sexually Abused: Patient unable to answer    Family History:    Family History  Adopted: Yes     ROS:  Please see the history of present illness.   All other ROS reviewed and negative.     Physical Exam/Data: Vitals:   02/29/24 0900 02/29/24 1000 02/29/24 1008 02/29/24 1100  BP: (!) 140/72 (!) 157/71  (!) 156/72  Pulse: (!) 52 (!) 56  (!) 55  Resp: 12 18  12   Temp:   97.8 F (36.6 C)   TempSrc:   Oral   SpO2: 100% 100%  100%  Weight:      Height:        Intake/Output Summary (Last 24 hours) at 02/29/2024 1223 Last data filed at 02/28/2024 2148 Gross per 24 hour  Intake 550 ml  Output --  Net 550 ml      02/28/2024    7:29 PM 02/02/2024    3:19 PM 01/23/2024   12:21 PM  Last 3  Weights  Weight (lbs) 224 lb  233 lb 246 lb  Weight (kg) 101.606 kg 105.688 kg 111.585 kg     Body mass index is 36.15 kg/m.  General:  Well nourished, well developed, in no acute distress HEENT: normal Neck: Unable to assess JVD due to body habitus Cardiac: bradycardic, regular rhythm; normal S1, S2; no murmur  Lungs:  clear to auscultation bilaterally, no wheezing, rhonchi or rales  Abd: firm and distended Ext: no edema Skin: warm and dry  Psych:  Normal affect   EKG:  The EKG was personally reviewed and demonstrates:  sinus bradycardia, rate 38 bpm Telemetry:  Telemetry was personally reviewed and demonstrates:  sinus rhythm, rate 40s-50s bpm  Laboratory Data: High Sensitivity Troponin:   Recent Labs  Lab 02/28/24 1820 02/28/24 2036  TROPONINIHS 10 9     Chemistry Recent Labs  Lab 02/28/24 1820 02/29/24 0527  NA 134* 136  K 5.2* 4.5  CL 107 107  CO2 18* 20*  GLUCOSE 85 169*  BUN 72* 69*  CREATININE 5.11* 5.12*  CALCIUM  9.2 9.2  MG 2.2  --   GFRNONAA 11* 11*  ANIONGAP 9 9    Recent Labs  Lab 02/28/24 1820  PROT 6.5  ALBUMIN 3.3*  AST 22  ALT 16  ALKPHOS 51  BILITOT 0.5   Lipids No results for input(s): CHOL, TRIG, HDL, LABVLDL, LDLCALC, CHOLHDL in the last 168 hours.  Hematology Recent Labs  Lab 02/28/24 1820  WBC 5.6  RBC 3.27*  HGB 9.8*  HCT 30.4*  MCV 93.0  MCH 30.0  MCHC 32.2  RDW 14.1  PLT 177   Thyroid  No results for input(s): TSH, FREET4 in the last 168 hours.  BNP Recent Labs  Lab 02/28/24 1820  BNP 149.1*    DDimer No results for input(s): DDIMER in the last 168 hours.  Radiology/Studies:  DG Chest Port 1 View Result Date: 02/28/2024 CLINICAL DATA:  Weakness bradycardia EXAM: PORTABLE CHEST 1 VIEW COMPARISON:  12/27/2023 FINDINGS: Mild cardiomegaly. No acute airspace disease, pleural effusion or pneumothorax. Aortic atherosclerosis. Postsurgical changes of the proximal right humerus. IMPRESSION: No active  disease. Mild cardiomegaly. Electronically Signed   By: Luke Bun M.D.   On: 02/28/2024 18:55   Assessment and Plan:  Symptomatic sinus bradycardia - Patient presenting with dyspnea and dizziness for the past day and found to have bradycardia with rates in the upper 30s on presentation - Likely in the setting of carvedilol  with worsening kidney function and hyperkalemia - Remains in sinus rhythm with HR somewhat improved 40s-50s bpm - Agree with discontinuing carvedilol  and close monitoring of HR - Patient is hemodynamically stable, no indication for pacemaker at this time  Acute on CKD IV Hyperkalemia - Recent nephrology note reviewed and recommended holding ARB and MRA, although patient reports taking these PTA - Received IV fluids in the ED - Appears volume overloaded on exam with abdominal distension, may require diuresis vs dialysis - Cr significantly increased from last week - Continue to hold nephrotoxic agents - Nephrology consulted  Chronic diastolic HF - Most recent echo in 2023 with EF 60-65% with G1DD - Appears volume overloaded on exam - BNP 149 - GDMT on hold in the setting of acute on chronic renal dysfunction - Will defer diuresis to nephrology - Recommend net even fluid balance for now  Hypertension - BP trending up this morning - Consider reintroduction of PTA Imdur  and/or amlodipine  - Discontinue carvedilol  as above   For questions or updates, please contact [facility] Please  consult [email or web address] for contact info under    Signed, Lesley LITTIE Maffucci, PA-C  02/29/2024 12:23 PM

## 2024-02-29 NOTE — ED Notes (Signed)
 Patient sat up in bed to eat.

## 2024-02-29 NOTE — Progress Notes (Addendum)
 [facility]  ROUNDING NOTE   Subjective:   Daniel Kline  is a 78 y.o.  male with diabetes mellitus type II, hypertension, glaucoma, hyperlipidemia, T cell lymphoma, rheumatoid arthritis and chronic kidney disease.  Patient presents to the emergency department complaining of dizziness and shortness of breath and has been admitted for Symptomatic sinus bradycardia [R00.1]  Patient is known to our practice from previous admission and has been followed outpatient by Daniel Kline.  Was last seen in office earlier this month for routine follow-up.  Patient states he has been experiencing the symptoms for 2 to 3 days.  Also reports poor oral intake, loss of appetite.  Currently seen sitting up on stretcher.  Room air.  Denies shortness of breath at this time.  Labs on ED arrival include sodium 134, potassium 5.2, BUN 72, creatinine 5.11 with GFR 11, BNP 149, and hemoglobin 9.8.  Chest x-ray shows mild cardiomegaly.  We have been consulted to evaluate acute kidney injury.   Objective:  Vital signs in last 24 hours:  Temp:  [97.8 F (36.6 C)-98.2 F (36.8 C)] 97.8 F (36.6 C) (07/23 1413) Pulse Rate:  [37-56] 55 (07/23 1414) Resp:  [12-20] 18 (07/23 1414) BP: (110-157)/(58-82) 146/71 (07/23 1414) SpO2:  [94 %-100 %] 99 % (07/23 1414) Weight:  [101.6 kg] 101.6 kg (07/22 1929)  Weight change:  Filed Weights   02/28/24 1929  Weight: 101.6 kg    Intake/Output: I/O last 3 completed shifts: In: 550 [IV Piggyback:550] Out: -    Intake/Output this shift:  No intake/output data recorded.  Physical Exam: General: NAD  Head: Normocephalic, atraumatic. Moist oral mucosal membranes  Eyes: Anicteric  Neck: Supple  Lungs:  Diminished in bases, room air  Heart: Regular rate and rhythm  Abdomen:  Firm, nontender, distention   Extremities: Trace peripheral edema.  Neurologic: Awake, alert, conversant  Skin: Bilateral lower extremity leg wraps       Basic Metabolic  Panel: Recent Labs  Lab 02/28/24 1820 02/29/24 0527  NA 134* 136  K 5.2* 4.5  CL 107 107  CO2 18* 20*  GLUCOSE 85 169*  BUN 72* 69*  CREATININE 5.11* 5.12*  CALCIUM  9.2 9.2  MG 2.2  --     Liver Function Tests: Recent Labs  Lab 02/28/24 1820  AST 22  ALT 16  ALKPHOS 51  BILITOT 0.5  PROT 6.5  ALBUMIN 3.3*   No results for input(s): LIPASE, AMYLASE in the last 168 hours. No results for input(s): AMMONIA in the last 168 hours.  CBC: Recent Labs  Lab 02/28/24 1820  WBC 5.6  NEUTROABS 3.3  HGB 9.8*  HCT 30.4*  MCV 93.0  PLT 177    Cardiac Enzymes: No results for input(s): CKTOTAL, CKMB, CKMBINDEX, TROPONINI in the last 168 hours.  BNP: Invalid input(s): POCBNP  CBG: Recent Labs  Lab 02/28/24 2206 02/29/24 0523 02/29/24 0756 02/29/24 1116  GLUCAP 88 159* 101* 93    Microbiology: Results for orders placed or performed during the hospital encounter of 12/27/23  Aerobic/Anaerobic Culture w Gram Stain (surgical/deep wound)     Status: None   Collection Time: 12/27/23 11:30 AM   Specimen: Wound  Result Value Ref Range Status   Specimen Description   Final    WOUND Performed at [facility], [address] [place], [place] [address]    Special Requests   Final    LL Performed at [facility], [address], [place], [place] [address]  Gram Stain   Final    NO WBC SEEN ABUNDANT GRAM POSITIVE RODS FEW GRAM POSITIVE COCCI    Culture   Final    MODERATE STAPHYLOCOCCUS AUREUS ABUNDANT CORYNEBACTERIUM STRIATUM Standardized susceptibility testing for this organism is not available. NO ANAEROBES ISOLATED Performed at [facility], [address] [place], [place] [address]    Report Status 01/01/2024 FINAL  Final   Organism ID, Bacteria STAPHYLOCOCCUS AUREUS  Final      Susceptibility   Staphylococcus aureus - MIC*    CIPROFLOXACIN  <=0.5 SENSITIVE Sensitive     ERYTHROMYCIN <=0.25 SENSITIVE Sensitive      GENTAMICIN <=0.5 SENSITIVE Sensitive     OXACILLIN 1 SENSITIVE Sensitive     TETRACYCLINE <=1 SENSITIVE Sensitive     VANCOMYCIN  1 SENSITIVE Sensitive     TRIMETH/SULFA <=10 SENSITIVE Sensitive     CLINDAMYCIN <=0.25 SENSITIVE Sensitive     RIFAMPIN <=0.5 SENSITIVE Sensitive     Inducible Clindamycin NEGATIVE Sensitive     LINEZOLID 2 SENSITIVE Sensitive     * MODERATE STAPHYLOCOCCUS AUREUS  C Difficile Quick Screen w PCR reflex     Status: None   Collection Time: 12/28/23 12:03 PM   Specimen: STOOL  Result Value Ref Range Status   C Diff antigen NEGATIVE NEGATIVE Final   C Diff toxin NEGATIVE NEGATIVE Final   C Diff interpretation No C. difficile detected.  Final    Comment: Performed at [facility], [address] [place], [place] [address]    Coagulation Studies: No results for input(s): LABPROT, INR in the last 72 hours.  Urinalysis: No results for input(s): COLORURINE, LABSPEC, PHURINE, GLUCOSEU, HGBUR, BILIRUBINUR, KETONESUR, PROTEINUR, UROBILINOGEN, NITRITE, LEUKOCYTESUR in the last 72 hours.  Invalid input(s): APPERANCEUR    Imaging: DG Chest Port 1 View Result Date: 02/28/2024 CLINICAL DATA:  Weakness bradycardia EXAM: PORTABLE CHEST 1 VIEW COMPARISON:  12/27/2023 FINDINGS: Mild cardiomegaly. No acute airspace disease, pleural effusion or pneumothorax. Aortic atherosclerosis. Postsurgical changes of the proximal right humerus. IMPRESSION: No active disease. Mild cardiomegaly. Electronically Signed   By: Luke Bun M.D.   On: 02/28/2024 18:55     Medications:     calcitRIOL   0.25 mcg Oral Daily   cholecalciferol   1,000 Units Oral Daily   cyanocobalamin   1,000 mcg Oral Daily   ferrous sulfate   325 mg Oral QHS   folic acid   1 mg Oral Daily   heparin  injection (subcutaneous)  5,000 Units Subcutaneous Q8H   insulin  aspart  0-15 Units Subcutaneous TID WC   insulin  aspart  0-5 Units Subcutaneous QHS   pantoprazole   40 mg  Oral BID   QUEtiapine   50 mg Oral QHS   rosuvastatin   10 mg Oral Daily   sodium chloride  flush  3 mL Intravenous Q12H   acetaminophen  **OR** acetaminophen , clorazepate , HYDROcodone -acetaminophen , melatonin, ondansetron  **OR** ondansetron  (ZOFRAN ) IV  Assessment/ Plan:  [name] is a 78 y.o.  male with diabetes mellitus type II, hypertension, glaucoma, hyperlipidemia, T cell lymphoma, rheumatoid arthritis and chronic kidney disease.  Patient presents to the emergency department complaining of dizziness and shortness of breath and has been admitted for Symptomatic sinus bradycardia [R00.1]    Acute Kidney Injury on chronic kidney disease stage IV with baseline creatinine 3.2 and GFR of 19 on 01/14/2024.  Acute kidney injury likely secondary to diuretics with poor oral intake.  He is experiencing bradycardia on ED arrival.  Carvedilol , losartan , furosemide  and spironolactone  currently held.  Will order  renal ultrasound to rule out hydronephrosis.  Continue to avoid nephrotoxic agents and therapies including hypotension.  No acute indication for dialysis however patient agreeable to renal replacement therapy is needed.  Lab Results  Component Value Date   CREATININE 5.12 (H) 02/29/2024   CREATININE 5.11 (H) 02/28/2024   CREATININE 4.08 (H) 02/02/2024    Intake/Output Summary (Last 24 hours) at 02/29/2024 1458 Last data filed at 02/28/2024 2148 Gross per 24 hour  Intake 550 ml  Output --  Net 550 ml   2. Anemia of chronic kidney disease Lab Results  Component Value Date   HGB 9.8 (L) 02/28/2024    Hemoglobin acceptable for renal patient.  Will continue to monitor for now.  3.  Hypertension with chronic kidney disease.  Home regimen includes amlodipine , carvedilol , furosemide , isosorbide , losartan , and spironolactone .  Currently receiving only amlodipine  and isosorbide  due to kidney injury.     LOS: 0 Linetta Regner 7/23/20252:58 PM

## 2024-02-29 NOTE — ED Notes (Signed)
PT working with patient.

## 2024-03-01 ENCOUNTER — Inpatient Hospital Stay: Admit: 2024-03-01 | Discharge: 2024-03-01 | Disposition: A | Discharge status: Home / Self Care | Attending: Medical

## 2024-03-01 ENCOUNTER — Inpatient Hospital Stay

## 2024-03-01 DIAGNOSIS — E875 Hyperkalemia: Secondary | ICD-10-CM

## 2024-03-01 DIAGNOSIS — I10 Essential (primary) hypertension: Secondary | ICD-10-CM | POA: Diagnosis not present

## 2024-03-01 DIAGNOSIS — M7989 Other specified soft tissue disorders: Secondary | ICD-10-CM

## 2024-03-01 DIAGNOSIS — N179 Acute kidney failure, unspecified: Secondary | ICD-10-CM | POA: Diagnosis not present

## 2024-03-01 DIAGNOSIS — I5032 Chronic diastolic (congestive) heart failure: Secondary | ICD-10-CM | POA: Diagnosis not present

## 2024-03-01 DIAGNOSIS — E1121 Type 2 diabetes mellitus with diabetic nephropathy: Secondary | ICD-10-CM | POA: Diagnosis not present

## 2024-03-01 DIAGNOSIS — R001 Bradycardia, unspecified: Secondary | ICD-10-CM | POA: Diagnosis not present

## 2024-03-01 DIAGNOSIS — R0602 Shortness of breath: Secondary | ICD-10-CM | POA: Diagnosis not present

## 2024-03-01 DIAGNOSIS — T447X5D Adverse effect of beta-adrenoreceptor antagonists, subsequent encounter: Secondary | ICD-10-CM

## 2024-03-01 DIAGNOSIS — R0609 Other forms of dyspnea: Secondary | ICD-10-CM

## 2024-03-01 LAB — ECHOCARDIOGRAM COMPLETE
AR max vel: 2.58 cm2
AV Area VTI: 2.4 cm2
AV Area mean vel: 2.36 cm2
AV Mean grad: 3 mmHg
AV Peak grad: 4.9 mmHg
Ao pk vel: 1.11 m/s
Area-P 1/2: 4.49 cm2
Calc EF: 68.2 %
Height: 66 in
MV M vel: 2.36 m/s
MV Peak grad: 22.3 mmHg
MV VTI: 1.62 cm2
S' Lateral: 3.6 cm
Single Plane A2C EF: 68.1 %
Single Plane A4C EF: 67.7 %
Weight: 3584 [oz_av]

## 2024-03-01 LAB — BASIC METABOLIC PANEL WITH GFR
Anion gap: 7 (ref 5–15)
BUN: 60 mg/dL — ABNORMAL HIGH (ref 8–23)
CO2: 21 mmol/L — ABNORMAL LOW (ref 22–32)
Calcium: 9.5 mg/dL (ref 8.9–10.3)
Chloride: 110 mmol/L (ref 98–111)
Creatinine, Ser: 4.77 mg/dL — ABNORMAL HIGH (ref 0.61–1.24)
GFR, Estimated: 12 mL/min — ABNORMAL LOW (ref >60)
Glucose, Bld: 104 mg/dL — ABNORMAL HIGH (ref 70–99)
Potassium: 5 mmol/L (ref 3.5–5.1)
Sodium: 138 mmol/L (ref 135–145)

## 2024-03-01 LAB — CBC
HCT: 32.9 % — ABNORMAL LOW (ref 39.0–52.0)
Hemoglobin: 10.6 g/dL — ABNORMAL LOW (ref 13.0–17.0)
MCH: 29.7 pg (ref 26.0–34.0)
MCHC: 32.2 g/dL (ref 30.0–36.0)
MCV: 92.2 fL (ref 80.0–100.0)
Platelets: 182 K/uL (ref 150–400)
RBC: 3.57 MIL/uL — ABNORMAL LOW (ref 4.22–5.81)
RDW: 13.7 % (ref 11.5–15.5)
WBC: 5 K/uL (ref 4.0–10.5)
nRBC: 0 % (ref 0.0–0.2)

## 2024-03-01 LAB — GLUCOSE, CAPILLARY
Glucose-Capillary: 105 mg/dL — ABNORMAL HIGH (ref 70–99)
Glucose-Capillary: 118 mg/dL — ABNORMAL HIGH (ref 70–99)

## 2024-03-01 LAB — CBG MONITORING, ED
Glucose-Capillary: 104 mg/dL — ABNORMAL HIGH (ref 70–99)
Glucose-Capillary: 109 mg/dL — ABNORMAL HIGH (ref 70–99)
Glucose-Capillary: 120 mg/dL — ABNORMAL HIGH (ref 70–99)

## 2024-03-01 MED ORDER — GABAPENTIN 100 MG PO CAPS
100.0000 mg | ORAL_CAPSULE | Freq: Three times a day (TID) | ORAL | Status: DC
  Administered 2024-03-01 – 2024-03-04 (×9): 100 mg via ORAL
  Filled 2024-03-01 (×9): qty 1

## 2024-03-01 MED ORDER — PERFLUTREN LIPID MICROSPHERE
1.0000 mL | INTRAVENOUS | Status: AC | PRN
  Administered 2024-03-01: 2 mL via INTRAVENOUS

## 2024-03-01 MED ORDER — HYDROXYZINE HCL 25 MG PO TABS
50.0000 mg | ORAL_TABLET | Freq: Two times a day (BID) | ORAL | Status: DC
  Administered 2024-03-01 – 2024-03-04 (×6): 50 mg via ORAL
  Filled 2024-03-01 (×6): qty 2

## 2024-03-01 MED ORDER — SODIUM CHLORIDE 0.9 % IV SOLN: INTRAVENOUS | Status: AC

## 2024-03-01 NOTE — Progress Notes (Addendum)
 [facility]  - [place] [facility]   PATIENT NAME: Daniel Kline    MR#:  [id number]  DATE OF BIRTH:  20-Dec-1945  SUBJECTIVE:  no family at bedside. Patient came in with dizziness and was found to have low heartrate. Currently heartrate in the 50-to 60s. Blood pressure bit on the higher side. Held beta-blocker.  Patient's heart rate remains stable 50-60. Tolerating PO diet well. Worked with physical therapy. I was able to speak with wife on the phone and she is like patient to return home with home health.    VITALS:  Blood pressure (!) 155/97, pulse 61, temperature 97.8 F (36.6 C), temperature source Oral, resp. rate 18, height 5' 6 (1.676 m), weight 101.6 kg, SpO2 99%.  PHYSICAL EXAMINATION:   GENERAL:  78 y.o.-year-old patient with no acute distress. Morbidly obese LUNGS: Normal breath sounds bilaterally, no wheezing CARDIOVASCULAR: S1, S2 normal. No murmur   ABDOMEN: Soft, nontender, nondistended. Bowel sounds present.  EXTREMITIES: +  edema b/l.    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   Cardiac Enzymes No results for input(s): TROPONINI in the last 168 hours. RADIOLOGY:  US  RENAL Result Date: 03/01/2024 EXAM: RETROPERITONEAL ULTRASOUND OF THE KIDNEYS AND URINARY BLADDER TECHNIQUE: Real-time ultrasonography of the retroperitoneum  including the kidneys and urinary bladder was performed. COMPARISON: 01/01/2024 CLINICAL HISTORY: Acute kidney failure (HCC) FINDINGS: RIGHT KIDNEY: The right kidney measures 9.7 x 6.3 x 5.3 cm with a volume of 169 cc. There is right renal cortical thinning and increased renal echogenicity. An interpolar right renal 2.7 x 2.4 x 2.3 cm lesion is avascular and mildly hyperattenuating on 11/25/2023 CT. LEFT KIDNEY: The left kidney measures 9.7 x 5.7 x 4.9 cm for a volume of 142 cc. There is mild renal cortical thinning and increased renal echogenicity. A simple cyst within the lower pole measures 2.2 cm. BLADDER: The urinary bladder is normal. IMPRESSION: 1. Bilateral increased renal echogenicity and cortical thinning, suggesting medical renal disease . 2. Avascular, mildly hyperattenuating 2.7 x 2.4 x 2.3 cm interpolar right renal lesion, stable compared to 11/25/23 CT. as on that exam, this is most likely a complex cyst but technically indeterminate. Consider further evaluation with non-emergent pre- and post-contrast abdominal MRI. Alternatively, this could be followed with ultrasound at 6 months. Electronically signed by: Rockey Kilts MD 03/01/2024 12:28 PM EDT RP Workstation: [id number]   DG Chest Port 1 View Result Date: 02/28/2024 CLINICAL DATA:  Weakness bradycardia EXAM: PORTABLE CHEST  1 VIEW COMPARISON:  12/27/2023 FINDINGS: Mild cardiomegaly. No acute airspace disease, pleural effusion or pneumothorax. Aortic atherosclerosis. Postsurgical changes of the proximal right humerus. IMPRESSION: No active disease. Mild cardiomegaly. Electronically Signed   By: Luke Bun M.D.   On: 02/28/2024 18:55    Assessment and Plan  Per H and P Daniel Kline is a 78 y.o. male with medical history significant for T2DM, CKD IV, anemia of chronic disease, RA, diastolic CHF, nocturnal hypoxemia on nighttime O2 at 2 L, history of hallucinations, being admitted with symptomatic bradycardia(HR 23 with EMS) and acute  worsening of CKD.  He takes Coreg  3.125 mg twice daily.  He presented by EMS with a complaint of dizziness and shortness of breath.  Most of history given by wife at bedside who stated that he was in his usual state of health until the morning of arrival when she noted that he did not seem to have an appetite then developed presenting symptoms later on in the day.    Creatinine 5.11 up from 2.34-month ago with metabolic acidosis of 18 and hyperkalemia 5.2.   Symptomatic sinus bradycardia Beta-blocker intolerance ---Heart rate in the high 30s to low 40s--HR now 55-64 --Patient symptomatic for dizziness --Discontinue carvedilol  for now --Pacer pads at bedside --[facility] consultation noted. No indication for pacemaker present. Continue to monitor -- heart rate remains stable. No further workup per [facility] cardiology.   Dizziness --Likely a combination of symptomatic bradycardia as well as dehydration from diuretic therapy   Acute renal failure superimposed on stage 4 chronic kidney disease (HCC) Acute metabolic acidosis Hyperkalemia --Likely secondary to diuretic meds for management of CHF and venous insufficiency --Hold diuretics --Was treated with an NS bolus in the ED --Hyperkalemia treated with calcium  gluconate --Will cautiously hydrate with an additional 500 mL NS --Nephrology consulted-- no indication for dialysis -- potassium down to 5.0 -- creatinine 5.12--4.77 (baseline creat--3.4--3.5)   Anemia of chronic disease --Hemoglobin at baseline --Continue B12, ferrous sulfate  and folic acid    Chronic diastolic CHF (congestive heart failure) (HCC) Chronic nocturnal hypoxemia HTN --Clinically euvolemic to dry --Holding Coreg , furosemide  due to bradycardia and AKI respectively --resume imdur  and amlodipine   --Continue supplemental oxygen   Type 2 diabetes mellitus with diabetic nephropathy (HCC) --Sliding scale insulin  coverage   Essential hypertension --resumed  amlodipine  and Imdur    Rheumatoid arthritis (HCC) Chronic pain --Continue hydrocodone  as needed --No longer on methotrexate    H/o Cutaneous T cell lymphoma --pt has not seen his Dermatologist since 10/24-- patient has appointment upcoming Tuesday --has cutaneous lesion with scabs and picked lesions   Procedures: Family communication : wife [name] on the phone Consults : nephrology, cardiology CODE STATUS: full DVT Prophylaxis : SCD Level of care: Progressive Status is: Inpatient Remains inpatient appropriate because: progression of chronic kidney disease, bradycardia    TOTAL TIME TAKING CARE OF THIS PATIENT: 40 minutes.  >50% time spent on counselling and coordination of care  Note: This dictation was prepared with Dragon dictation along with smaller phrase technology. Any transcriptional errors that result from this process are unintentional.  Leita Blanch M.D    [facility]   CC: Primary care physician; [name], MD

## 2024-03-01 NOTE — Progress Notes (Signed)
 [facility]  ROUNDING NOTE   Subjective:   Daniel Kline  is a 78 y.o.  male with diabetes mellitus type II, hypertension, glaucoma, hyperlipidemia, T cell lymphoma, rheumatoid arthritis and chronic kidney disease.  Patient presents to the emergency department complaining of dizziness and shortness of breath and has been admitted for Symptomatic sinus bradycardia [R00.1]  Patient is known to our practice from previous admission and has been followed outpatient by Dr. Douglas.    Update Patient seen resting in bed Alert oriented Continues to complain of fatigue Poor oral intake   Objective:  Vital signs in last 24 hours:  Temp:  [97.8 F (36.6 C)-98.6 F (37 C)] 97.8 F (36.6 C) (07/24 0727) Pulse Rate:  [52-60] 56 (07/24 1021) Resp:  [14-23] 16 (07/24 1021) BP: (120-160)/(51-96) 146/71 (07/24 1021) SpO2:  [93 %-100 %] 100 % (07/24 1021)  Weight change:  Filed Weights   02/28/24 1929  Weight: 101.6 kg    Intake/Output: I/O last 3 completed shifts: In: 550 [IV Piggyback:550] Out: 750 [Urine:750]   Intake/Output this shift:  Total I/O In: -  Out: 400 [Urine:400]  Physical Exam: General: NAD  Head: Normocephalic, atraumatic. Moist oral mucosal membranes  Eyes: Anicteric  Neck: Supple  Lungs:  Diminished in bases, room air  Heart: Regular rate and rhythm  Abdomen:  Firm, nontender, distention   Extremities: Trace peripheral edema.  Neurologic: Awake, alert, conversant  Skin: Bilateral lower extremity leg wraps       Basic Metabolic Panel: Recent Labs  Lab 02/28/24 1820 02/29/24 0527 03/01/24 0844  NA 134* 136 138  K 5.2* 4.5 5.0  CL 107 107 110  CO2 18* 20* 21*  GLUCOSE 85 169* 104*  BUN 72* 69* 60*  CREATININE 5.11* 5.12* 4.77*  CALCIUM  9.2 9.2 9.5  MG 2.2  --   --     Liver Function Tests: Recent Labs  Lab 02/28/24 1820  AST 22  ALT 16  ALKPHOS 51  BILITOT 0.5  PROT 6.5  ALBUMIN 3.3*   No results for input(s):  LIPASE, AMYLASE in the last 168 hours. No results for input(s): AMMONIA in the last 168 hours.  CBC: Recent Labs  Lab 02/28/24 1820 03/01/24 0844  WBC 5.6 5.0  NEUTROABS 3.3  --   HGB 9.8* 10.6*  HCT 30.4* 32.9*  MCV 93.0 92.2  PLT 177 182    Cardiac Enzymes: No results for input(s): CKTOTAL, CKMB, CKMBINDEX, TROPONINI in the last 168 hours.  BNP: Invalid input(s): POCBNP  CBG: Recent Labs  Lab 02/29/24 1606 02/29/24 2332 03/01/24 0531 03/01/24 0724 03/01/24 1105  GLUCAP 119* 119* 104* 109* 120*    Microbiology: Results for orders placed or performed during the hospital encounter of 12/27/23  Aerobic/Anaerobic Culture w Gram Stain (surgical/deep wound)     Status: None   Collection Time: 12/27/23 11:30 AM   Specimen: Wound  Result Value Ref Range Status   Specimen Description   Final    WOUND Performed at [facility], [address] [place], [place] [address]    Special Requests   Final    LL Performed at [facility], [address] [place], [place] [address]    Gram Stain   Final    NO WBC SEEN ABUNDANT GRAM POSITIVE RODS FEW GRAM POSITIVE COCCI    Culture   Final    MODERATE STAPHYLOCOCCUS AUREUS ABUNDANT CORYNEBACTERIUM STRIATUM Standardized susceptibility testing for this organism is not available. NO ANAEROBES ISOLATED  Performed at [facility], [address], [place], [place] [address]    Report Status 01/01/2024 FINAL  Final   Organism ID, Bacteria STAPHYLOCOCCUS AUREUS  Final      Susceptibility   Staphylococcus aureus - MIC*    CIPROFLOXACIN  <=0.5 SENSITIVE Sensitive     ERYTHROMYCIN <=0.25 SENSITIVE Sensitive     GENTAMICIN <=0.5 SENSITIVE Sensitive     OXACILLIN 1 SENSITIVE Sensitive     TETRACYCLINE <=1 SENSITIVE Sensitive     VANCOMYCIN  1 SENSITIVE Sensitive     TRIMETH/SULFA <=10 SENSITIVE Sensitive     CLINDAMYCIN <=0.25 SENSITIVE Sensitive     RIFAMPIN <=0.5 SENSITIVE Sensitive      Inducible Clindamycin NEGATIVE Sensitive     LINEZOLID 2 SENSITIVE Sensitive     * MODERATE STAPHYLOCOCCUS AUREUS  C Difficile Quick Screen w PCR reflex     Status: None   Collection Time: 12/28/23 12:03 PM   Specimen: STOOL  Result Value Ref Range Status   C Diff antigen NEGATIVE NEGATIVE Final   C Diff toxin NEGATIVE NEGATIVE Final   C Diff interpretation No C. difficile detected.  Final    Comment: Performed at [facility], [address] [place], [place] [address]    Coagulation Studies: No results for input(s): LABPROT, INR in the last 72 hours.  Urinalysis: No results for input(s): COLORURINE, LABSPEC, PHURINE, GLUCOSEU, HGBUR, BILIRUBINUR, KETONESUR, PROTEINUR, UROBILINOGEN, NITRITE, LEUKOCYTESUR in the last 72 hours.  Invalid input(s): APPERANCEUR    Imaging: US  RENAL Result Date: 03/01/2024 EXAM: RETROPERITONEAL ULTRASOUND OF THE KIDNEYS AND URINARY BLADDER TECHNIQUE: Real-time ultrasonography of the retroperitoneum including the kidneys and urinary bladder was performed. COMPARISON: 01/01/2024 CLINICAL HISTORY: Acute kidney failure (HCC) FINDINGS: RIGHT KIDNEY: The right kidney measures 9.7 x 6.3 x 5.3 cm with a volume of 169 cc. There is right renal cortical thinning and increased renal echogenicity. An interpolar right renal 2.7 x 2.4 x 2.3 cm lesion is avascular and mildly hyperattenuating on 11/25/2023 CT. LEFT KIDNEY: The left kidney measures 9.7 x 5.7 x 4.9 cm for a volume of 142 cc. There is mild renal cortical thinning and increased renal echogenicity. A simple cyst within the lower pole measures 2.2 cm. BLADDER: The urinary bladder is normal. IMPRESSION: 1. Bilateral increased renal echogenicity and cortical thinning, suggesting medical renal disease . 2. Avascular, mildly hyperattenuating 2.7 x 2.4 x 2.3 cm interpolar right renal lesion, stable compared to 11/25/23 CT. as on that exam, this is most likely a complex cyst but technically  indeterminate. Consider further evaluation with non-emergent pre- and post-contrast abdominal MRI. Alternatively, this could be followed with ultrasound at 6 months. Electronically signed by: Rockey Kilts MD 03/01/2024 12:28 PM EDT RP Workstation: [id number]   DG Chest Port 1 View Result Date: 02/28/2024 CLINICAL DATA:  Weakness bradycardia EXAM: PORTABLE CHEST 1 VIEW COMPARISON:  12/27/2023 FINDINGS: Mild cardiomegaly. No acute airspace disease, pleural effusion or pneumothorax. Aortic atherosclerosis. Postsurgical changes of the proximal right humerus. IMPRESSION: No active disease. Mild cardiomegaly. Electronically Signed   By: Luke Bun M.D.   On: 02/28/2024 18:55     Medications:     amLODipine   10 mg Oral Daily   calcitRIOL   0.25 mcg Oral Daily   cholecalciferol   1,000 Units Oral Daily   cyanocobalamin   1,000 mcg Oral Daily   ferrous sulfate   325 mg Oral QHS   folic acid   1 mg Oral Daily   heparin  injection (subcutaneous)  5,000 Units Subcutaneous Q8H  insulin  aspart  0-15 Units Subcutaneous TID WC   insulin  aspart  0-5 Units Subcutaneous QHS   isosorbide  mononitrate  60 mg Oral QAC lunch   latanoprost   1 drop Both Eyes QHS   pantoprazole   40 mg Oral BID   QUEtiapine   50 mg Oral QHS   rosuvastatin   10 mg Oral Daily   sodium chloride  flush  3 mL Intravenous Q12H   acetaminophen  **OR** acetaminophen , clorazepate , HYDROcodone -acetaminophen , melatonin, ondansetron  **OR** ondansetron  (ZOFRAN ) IV  Assessment/ Plan:  Daniel Kline is a 78 y.o.  male with diabetes mellitus type II, hypertension, glaucoma, hyperlipidemia, T cell lymphoma, rheumatoid arthritis and chronic kidney disease.  Patient presents to the emergency department complaining of dizziness and shortness of breath and has been admitted for Symptomatic sinus bradycardia [R00.1]    Acute Kidney Injury on chronic kidney disease stage IV with baseline creatinine 3.2 and GFR of 19 on 01/14/2024.  Acute kidney injury  likely secondary to diuretics with poor oral intake.  He is experiencing bradycardia on ED arrival.  Carvedilol , losartan , furosemide  and spironolactone  currently held.  Renal ultrasound negative for obstruction.  Creatinine has shown some improvement today, 4.77.  Will consider gentle IV hydration overnight and assess patient in AM.  No acute indication for dialysis at this time.  Patient encouraged to increase oral intake.  Will order protein supplementation.  Lab Results  Component Value Date   CREATININE 4.77 (H) 03/01/2024   CREATININE 5.12 (H) 02/29/2024   CREATININE 5.11 (H) 02/28/2024    Intake/Output Summary (Last 24 hours) at 03/01/2024 1320 Last data filed at 03/01/2024 0727 Gross per 24 hour  Intake --  Output 1150 ml  Net -1150 ml   2. Anemia of chronic kidney disease Lab Results  Component Value Date   HGB 10.6 (L) 03/01/2024    Hemoglobin remains at goal.  No need for ESA's at this time.  3.  Hypertension with chronic kidney disease.  Home regimen includes amlodipine , carvedilol , furosemide , isosorbide , losartan , and spironolactone .  Currently receiving only amlodipine  and isosorbide  due to kidney injury.  Blood pressure 146/71    LOS: 1 Adysen Raphael 7/24/20251:20 PM

## 2024-03-01 NOTE — Progress Notes (Signed)
  Progress Note  Patient Name: Daniel Kline Date of Encounter: 03/01/2024 [facility] Cardiologist: Evalene Lunger, MD   Interval Summary    Heart rates up to the 50s. Scr 5.12>4.77. Patient is feeling OK.   Vital Signs Vitals:   03/01/24 0400 03/01/24 0535 03/01/24 0543 03/01/24 0727  BP: (!) 148/96 131/83    Pulse: (!) 52 60    Resp: 14 (!) 23    Temp:   98.1 F (36.7 C) 97.8 F (36.6 C)  TempSrc:   Oral Oral  SpO2: 99% 99%    Weight:      Height:        Intake/Output Summary (Last 24 hours) at 03/01/2024 0749 Last data filed at 03/01/2024 0727 Gross per 24 hour  Intake --  Output 1150 ml  Net -1150 ml      02/28/2024    7:29 PM 02/02/2024    3:19 PM 01/23/2024   12:21 PM  Last 3 Weights  Weight (lbs) 224 lb 233 lb 246 lb  Weight (kg) 101.606 kg 105.688 kg 111.585 kg      Telemetry/ECG  SB HR 50s - Personally Reviewed  Physical Exam  GEN: No acute distress.   Neck: No JVD Cardiac: RRR, no murmurs, rubs, or gallops.  Respiratory: Clear to auscultation bilaterally. GI: Soft, nontender, non-distended  MS: + lower leg edema  Patient Profile: Daniel Kline is a 78 y.o. male with a hx of atrial flutter s/p ablation in 04/2017, diastolic dysfunction, cutaneous T cell lymphoma, T2Dm, CKD IV, hypertension, hyperlipidemia, chronic low back pain, and OSA on CPAP who is being seen 02/29/2024 for the evaluation of symptomatic bradycardia  Assessment & Plan   Symptomatic bradycardia - presented with dyspnea and dizziness found to have bradycardia with rates in the upper 30s.  - PTA coreg , which was held - heart rate today in the 50s - no indication for PPM  Acute on chronic CKD IV Hyperkalemia - ARB and MRA held - s/p IVF - nephrology following.  AKI secondary to diuretics and poor oral intake as well as bradycardia - Scr improving 5.12>4.77. daily BMET  Chronic diastolic HF - echo in 2023 showed LVEF 60-65% with G1DD - BNP 149 - diuresis per  nephrology  HTN - coreg  held - amlodipine  10mg  daily - Imdur  60mg  daily - blood pressures good    For questions or updates, please contact  [facility] Please consult [email or web address] for contact info under       Signed, Worley Radermacher VEAR Fishman, PA-C

## 2024-03-01 NOTE — Progress Notes (Signed)
 PT Cancellation Note  Patient Details Name: Daniel Kline MRN: [id number] DOB: June 25, 1946   Cancelled Treatment:     PT attempt 2 x this date. First attempt, pt sleeping soundly and unable to awake enough to safely participate. 2nd attempt pt refused. I honestly just don't feel like it. Author encouraged OOB activity but pt request return tomorrow after breakfast. Discussed current DC recommendation and pt's personal plans.  Me and my wife decided I will go home and just have therapy come there. Pt also does endorse having several recent falls.  Will return tomorrow as requested.    Rankin KATHEE Essex 03/01/2024, 4:10 PM

## 2024-03-02 DIAGNOSIS — E1121 Type 2 diabetes mellitus with diabetic nephropathy: Secondary | ICD-10-CM | POA: Diagnosis not present

## 2024-03-02 DIAGNOSIS — I5032 Chronic diastolic (congestive) heart failure: Secondary | ICD-10-CM | POA: Diagnosis not present

## 2024-03-02 DIAGNOSIS — N179 Acute kidney failure, unspecified: Secondary | ICD-10-CM | POA: Diagnosis not present

## 2024-03-02 DIAGNOSIS — R001 Bradycardia, unspecified: Secondary | ICD-10-CM | POA: Diagnosis not present

## 2024-03-02 LAB — BASIC METABOLIC PANEL WITH GFR
Anion gap: 10 (ref 5–15)
BUN: 60 mg/dL — ABNORMAL HIGH (ref 8–23)
CO2: 20 mmol/L — ABNORMAL LOW (ref 22–32)
Calcium: 9.6 mg/dL (ref 8.9–10.3)
Chloride: 109 mmol/L (ref 98–111)
Creatinine, Ser: 4.47 mg/dL — ABNORMAL HIGH (ref 0.61–1.24)
GFR, Estimated: 13 mL/min — ABNORMAL LOW (ref >60)
Glucose, Bld: 104 mg/dL — ABNORMAL HIGH (ref 70–99)
Potassium: 5.2 mmol/L — ABNORMAL HIGH (ref 3.5–5.1)
Sodium: 139 mmol/L (ref 135–145)

## 2024-03-02 LAB — GLUCOSE, CAPILLARY
Glucose-Capillary: 98 mg/dL (ref 70–99)
Glucose-Capillary: 89 mg/dL (ref 70–99)
Glucose-Capillary: 126 mg/dL — ABNORMAL HIGH (ref 70–99)
Glucose-Capillary: 91 mg/dL (ref 70–99)
Glucose-Capillary: 146 mg/dL — ABNORMAL HIGH (ref 70–99)

## 2024-03-02 MED ORDER — SODIUM ZIRCONIUM CYCLOSILICATE 10 G PO PACK
10.0000 g | PACK | Freq: Once | ORAL | Status: AC
  Administered 2024-03-02: 10 g via ORAL
  Filled 2024-03-02: qty 1

## 2024-03-02 NOTE — Progress Notes (Signed)
 [facility]  - [place] [facility]   PATIENT NAME: Daniel Kline    MR#:  [id number]  DATE OF BIRTH:  [birth year]  SUBJECTIVE:  no family at bedside. Patient came in with dizziness and was found to have low heartrate. Currently heartrate in the 50-to 60s. Blood pressure bit on the higher side. Held beta-blocker.  Patient's heart rate remains stable 50-60. Tolerating PO diet well. Worked with physical therapy. I was able to speak with wife on the phone and she would like patient to return home with home health.    VITALS:  Blood pressure (!) 140/84, pulse 60, temperature 98.4 F (36.9 C), resp. rate (!) 21, height 5' 6 (1.676 m), weight 96.7 kg, SpO2 100%.  PHYSICAL EXAMINATION:   GENERAL:  78 y.o.-year-old patient with no acute distress. Morbidly obese LUNGS: Normal breath sounds bilaterally, no wheezing CARDIOVASCULAR: S1, S2 normal. No murmur   ABDOMEN: Soft, nontender, nondistended. Bowel sounds present.  EXTREMITIES: +  edema b/l.    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   Cardiac Enzymes No results for input(s): TROPONINI in the last 168 hours. RADIOLOGY:  ECHOCARDIOGRAM COMPLETE Result Date: 03/01/2024    ECHOCARDIOGRAM REPORT   Patient Name:   Daniel Kline Date of Exam: 03/01/2024 Medical Rec #:  [id number]        Height:       66.0  in Accession #:    [id number]       Weight:       224.0 lb Date of Birth:  January 29, 1946        BSA:          2.098 m Patient Age:    78 years         BP:           104/68 mmHg Patient Gender: M                HR:           48 bpm. Exam Location:  [facility] Procedure: 2D Echo, Cardiac Doppler, Color Doppler and Intracardiac            Opacification Agent (Both Spectral and Color Flow Doppler were            utilized during procedure). Indications:     Dyspnea R06.00  History:         Patient has prior history of Echocardiogram examinations, most  recent 03/17/2022. Arrythmias:Bradycardia;                  Signs/Symptoms:Dyspnea.  Sonographer:     Ashley McNeely-Sloane Referring Phys:  [id number] CADENCE H FURTH Diagnosing Phys: Evalene Lunger MD IMPRESSIONS  1. Left ventricular ejection fraction, by estimation, is 60 to 65%. Left ventricular ejection fraction by 2D MOD biplane is 68.2 %. The left ventricle has normal function. The left ventricle has no regional wall motion abnormalities. Left ventricular diastolic parameters are consistent with Grade I diastolic dysfunction (impaired relaxation).  2. Right ventricular systolic function is normal. The right ventricular size is normal.  3. The mitral valve is normal in structure. Mild mitral valve regurgitation. No evidence of mitral stenosis.  4. The aortic valve is normal in structure. Aortic valve regurgitation is not visualized. No aortic stenosis is present.  5. The inferior vena cava is dilated in size with >50% respiratory variability, suggesting right atrial pressure of 8 mmHg. FINDINGS  Left Ventricle: Left ventricular ejection fraction, by estimation, is 60 to 65%. Left ventricular ejection fraction by 2D MOD biplane is 68.2 %. The left ventricle has normal function. The left ventricle has no regional wall motion abnormalities. Definity  contrast agent was given IV to delineate the left ventricular endocardial borders. Strain was performed and the global  longitudinal strain is indeterminate. The left ventricular internal cavity size was normal in size. There is no left ventricular hypertrophy. Left ventricular diastolic parameters are consistent with Grade I diastolic dysfunction (impaired relaxation). Right Ventricle: The right ventricular size is normal. No increase in right ventricular wall thickness. Right ventricular systolic function is normal. Left Atrium: Left atrial size was normal in size. Right Atrium: Right atrial size was normal in size. Pericardium: There is no evidence of pericardial effusion. Mitral Valve: The mitral valve is normal in structure. Mild mitral valve regurgitation, with eccentric medially directed jet. No evidence of mitral valve stenosis. MV peak gradient, 4.5 mmHg. The mean mitral valve gradient is 1.0 mmHg. There is no evidence of mitral valve vegetation. Tricuspid Valve: The tricuspid valve is normal in structure. Tricuspid valve regurgitation is not demonstrated. No evidence of tricuspid stenosis. Aortic Valve: The aortic valve is normal in structure. Aortic valve regurgitation is not visualized. No aortic stenosis is present. Aortic valve mean gradient measures 3.0 mmHg. Aortic valve peak gradient measures 4.9 mmHg. Aortic valve area, by VTI measures 2.40 cm. Pulmonic Valve: The pulmonic valve was normal in structure. Pulmonic valve regurgitation is not visualized. No evidence of pulmonic stenosis. Aorta: The aortic root is normal in size and structure. Venous: The inferior vena cava is dilated in size with greater than 50% respiratory variability, suggesting right atrial pressure of 8 mmHg. IAS/Shunts: No atrial level shunt detected by color flow Doppler. Additional Comments: 3D was performed not requiring image post processing on an independent workstation and was indeterminate.  LEFT VENTRICLE PLAX 2D                        Biplane EF (MOD) LVIDd:         5.10 cm         LV Biplane EF:   Left LVIDs:         3.60 cm                           ventricular LV PW:         1.00 cm  ejection LV IVS:        0.90 cm                          fraction by LVOT diam:     1.90 cm                          2D MOD LV SV:         65                               biplane is LV SV Index:   31                               68.2 %. LVOT Area:     2.84 cm                                Diastology                                LV e' medial:    6.31 cm/s LV Volumes (MOD)               LV E/e' medial:  11.7 LV vol d, MOD    108.0 ml      LV e' lateral:   10.80 cm/s A2C:                           LV E/e' lateral: 6.8 LV vol d, MOD    124.0 ml A4C: LV vol s, MOD    34.5 ml A2C: LV vol s, MOD    40.1 ml A4C: LV SV MOD A2C:   73.5 ml LV SV MOD A4C:   124.0 ml LV SV MOD BP:    81.4 ml RIGHT VENTRICLE             IVC RV Basal diam:  4.00 cm     IVC diam: 2.30 cm RV Mid diam:    2.70 cm RV S prime:     12.70 cm/s TAPSE (M-mode): 2.7 cm LEFT ATRIUM             Index        RIGHT ATRIUM           Index LA diam:        4.20 cm 2.00 cm/m   RA Area:     18.30 cm LA Vol (A2C):   28.9 ml 13.77 ml/m  RA Volume:   44.40 ml  21.16 ml/m LA Vol (A4C):   60.2 ml 28.69 ml/m LA Biplane Vol: 45.5 ml 21.68 ml/m  AORTIC VALVE                    PULMONIC VALVE AV Area (Vmax):    2.58 cm     PV Vmax:        1.08 m/s AV Area (Vmean):   2.36 cm     PV Vmean:       69.800 cm/s AV Area (VTI):     2.40 cm     PV VTI:         0.232 m AV  Vmax:           111.00 cm/s  PV Peak grad:   4.7 mmHg AV Vmean:          76.300 cm/s  PV Mean grad:   2.0 mmHg AV VTI:            0.271 m      RVOT Peak grad: 2 mmHg AV Peak Grad:      4.9 mmHg AV Mean Grad:      3.0 mmHg LVOT Vmax:         101.00 cm/s LVOT Vmean:        63.400 cm/s LVOT VTI:          0.229 m LVOT/AV VTI ratio: 0.85  AORTA Ao Root diam: 3.40 cm Ao Asc diam:  3.50 cm MITRAL VALVE MV Area (PHT): 4.49 cm    SHUNTS MV Area VTI:   1.62 cm    Systemic VTI:  0.23 m MV Peak grad:  4.5 mmHg    Systemic Diam: 1.90 cm MV  Mean grad:  1.0 mmHg    Pulmonic VTI:  0.157 m MV Vmax:       1.06 m/s MV Vmean:      50.3 cm/s MV Decel Time: 169 msec MR Peak grad: 22.3 mmHg MR Mean grad: 15.0 mmHg MR Vmax:      236.00 cm/s MR Vmean:     186.0 cm/s MV E velocity: 73.90 cm/s MV A velocity: 72.30 cm/s MV E/A ratio:  1.02 Evalene Lunger MD Electronically signed by Evalene Lunger MD Signature Date/Time: 03/01/2024/4:27:33 PM    Final    US  RENAL Result Date: 03/01/2024 EXAM: RETROPERITONEAL ULTRASOUND OF THE KIDNEYS AND URINARY BLADDER TECHNIQUE: Real-time ultrasonography of the retroperitoneum including the kidneys and urinary bladder was performed. COMPARISON: 01/01/2024 CLINICAL HISTORY: Acute kidney failure (HCC) FINDINGS: RIGHT KIDNEY: The right kidney measures 9.7 x 6.3 x 5.3 cm with a volume of 169 cc. There is right renal cortical thinning and increased renal echogenicity. An interpolar right renal 2.7 x 2.4 x 2.3 cm lesion is avascular and mildly hyperattenuating on 11/25/2023 CT. LEFT KIDNEY: The left kidney measures 9.7 x 5.7 x 4.9 cm for a volume of 142 cc. There is mild renal cortical thinning and increased renal echogenicity. A simple cyst within the lower pole measures 2.2 cm. BLADDER: The urinary bladder is normal. IMPRESSION: 1. Bilateral increased renal echogenicity and cortical thinning, suggesting medical renal disease . 2. Avascular, mildly hyperattenuating 2.7 x 2.4 x 2.3 cm interpolar right renal lesion, stable compared to 11/25/23 CT. as on that exam, this is most likely a complex cyst but technically indeterminate. Consider further evaluation with non-emergent pre- and post-contrast abdominal MRI. Alternatively, this could be followed with ultrasound at 6 months. Electronically signed by: Rockey Kilts MD 03/01/2024 12:28 PM EDT RP Workstation: [id number]    Assessment and Plan  Per H and P YECHIEL ERNY is a 78 y.o. male with medical history significant for T2DM, CKD IV, anemia of chronic disease, RA, diastolic CHF,  nocturnal hypoxemia on nighttime O2 at 2 L, history of hallucinations, being admitted with symptomatic bradycardia(HR 38 with EMS) and acute worsening of CKD.  He takes Coreg  3.125 mg twice daily.  He presented by EMS with a complaint of dizziness and shortness of breath.  Most of history given by wife at bedside who stated that he was in his usual state of health until the morning of arrival when she noted that he did  not seem to have an appetite then developed presenting symptoms later on in the day.    Creatinine 5.11 up from 2.54-month ago with metabolic acidosis of 18 and hyperkalemia 5.2.   Symptomatic sinus bradycardia Beta-blocker intolerance ---Heart rate in the high 30s to low 40s--HR now 55-64 --Patient symptomatic for dizziness --Discontinue carvedilol  for now --Pacer pads at bedside --[facility] Cardiology consultation noted. No indication for pacemaker present. Continue to monitor -- heart rate remains stable. No further workup per [facility] cardiology.   Dizziness --Likely a combination of symptomatic bradycardia as well as dehydration from diuretic therapy   Acute renal failure superimposed on stage 4 chronic kidney disease (HCC) Acute metabolic acidosis Hyperkalemia --(baseline creat--3.4--3.5) --Likely secondary to diuretic meds for management of CHF and venous insufficiency --Hold diuretics --Was treated with an NS bolus in the ED --Hyperkalemia treated with calcium  gluconate --Will cautiously hydrate with an additional 500 mL NS --Nephrology consulted-- no indication for dialysis -- potassium down to 5.0 -- creatinine 5.12--4.77--4.4-- cont IVF per Nephrology for 1-2 days   Anemia of chronic disease --Hemoglobin at baseline --Continue B12, ferrous sulfate  and folic acid    Chronic diastolic CHF (congestive heart failure) (HCC) Chronic nocturnal hypoxemia HTN --Clinically euvolemic to dry --Holding Coreg , furosemide  due to bradycardia and AKI respectively --resume imdur   and amlodipine   --Continue supplemental oxygen   Type 2 diabetes mellitus with diabetic nephropathy (HCC) --Sliding scale insulin  coverage   Essential hypertension --resumed amlodipine  and Imdur    Rheumatoid arthritis (HCC) Chronic pain --Continue hydrocodone  as needed --No longer on methotrexate    H/o Cutaneous T cell lymphoma --pt has not seen his Dermatologist since 10/24-- patient has appointment upcoming Tuesday --has cutaneous lesion with scabs and picked lesions   Procedures: Family communication : wife [name] on the phone Consults : nephrology, cardiology CODE STATUS: full DVT Prophylaxis : SCD Level of care: Progressive Status is: Inpatient Remains inpatient appropriate because: progression of chronic kidney disease, bradycardia    TOTAL TIME TAKING CARE OF THIS PATIENT: 35 minutes.  >50% time spent on counselling and coordination of care  Note: This dictation was prepared with Dragon dictation along with smaller phrase technology. Any transcriptional errors that result from this process are unintentional.  Leita Blanch M.D    [facility]   CC: Primary care physician; [name], MD

## 2024-03-02 NOTE — Care Management Important Message (Signed)
 Important Message  Patient Details  Name: Daniel Kline MRN: [id number] Date of Birth: [birth year]   Important Message Given:  Yes - Medicare IM     [name] 03/02/2024, 1:24 PM

## 2024-03-02 NOTE — Progress Notes (Signed)
 Physical Therapy Treatment Patient Details Name: Daniel Kline MRN: [id number] DOB: Jul 12, 1946 Today's Date: 03/02/2024   History of Present Illness Pt is a 78 y/o male admitted secondary to dizziness and symptomatic bradycardia. Cardiology and Nephrology have been consulted. PMH including but not limited to T2DM, CKD IV, anemia of chronic disease, RA, diastolic CHF, nocturnal hypoxemia on nighttime O2 at 2 L, history of hallucinations.    PT Comments  Pt was long sitting in bed upon arrival. He was slightly anxious and stressed after not being able to get in touch with his spouse. Author assisted pt with calling her. She currently is using pt's phone due to her phone being missing. Pt thought he was in [place] but once reoriented he remains oriented through the reminder of session. Pt cleared for participation even with setting of elevated K+ level. Limited session overall. Pt needed to have BM, per staff has had several loose Bms today. Overall pt remains limited by fatigue. SOB noted with just transferring from EOB to [facility]. HR was irregular however did not exceed 90 bpm throughout session. Pt remains weak and requiring assistance for all mobility.  I still want to go home from here. I don't need rehab. My wife will take care of me. Pt will continue to benefit from skilled PT at DC to maximize his independence and safety with all ADLs.    If plan is discharge home, recommend the following: A lot of help with walking and/or transfers;Two people to help with walking and/or transfers;A lot of help with bathing/dressing/bathroom;Help with stairs or ramp for entrance;Assist for transportation;Assistance with cooking/housework     Equipment Recommendations  None recommended by PT       Precautions / Restrictions Precautions Precautions: Fall Recall of Precautions/Restrictions: Intact Restrictions Weight Bearing Restrictions Per Provider Order: No     Mobility  Bed Mobility Overal bed  mobility: Needs Assistance Bed Mobility: Supine to Sit, Sit to Supine  Supine to sit: Min assist, Mod assist  General bed mobility comments: pt continues to require increased tiome and assistance to safely achieve EOB sitting from long sitting. Pt slightly SOB. Irregular HR throughout session but maintained < 90bpm.    Transfers Overall transfer level: Needs assistance Equipment used: Rolling walker (2 wheels) Transfers: Bed to chair/wheelchair/BSC Sit to Stand: Contact guard assist, Min assist, From elevated surface  General transfer comment: CGA to stand from elevated bed height but min friom lowest setting. session limited by pt needing to get to [facility] for BM. RN techs took over session once pt was seated on BSC.    Ambulation/Gait Ambulation/Gait assistance: Contact guard assist Gait Distance (Feet): 3 Feet Assistive device: Rolling walker (2 wheels) Gait Pattern/deviations: Step-to pattern Gait velocity: decreased  General Gait Details: Pt took several steps form EOB to BSC. Will progress gait diostances in future sessions. per RN staff, pt has had 3 very loose BMs today    Balance Overall balance assessment: Needs assistance, History of Falls Sitting-balance support: Feet supported Sitting balance-Leahy Scale: Fair     Standing balance support: During functional activity, Reliant on assistive device for balance, Bilateral upper extremity supported Standing balance-Leahy Scale: Poor       Communication Communication Communication: No apparent difficulties  Cognition Arousal: Alert Behavior During Therapy: WFL for tasks assessed/performed   PT - Cognitive impairments: No apparent impairments    PT - Cognition Comments: Pt is A and O x 4 Following commands: Intact      Cueing Cueing Techniques: Verbal  cues         Pertinent Vitals/Pain Pain Assessment Pain Assessment: No/denies pain     PT Goals (current goals can now be found in the care plan section) Acute  Rehab PT Goals Patient Stated Goal: to feel better Progress towards PT goals: Progressing toward goals    Frequency    Min 3X/week       AM-PAC PT 6 Clicks Mobility   Outcome Measure  Help needed turning from your back to your side while in a flat bed without using bedrails?: A Little Help needed moving from lying on your back to sitting on the side of a flat bed without using bedrails?: A Lot Help needed moving to and from a bed to a chair (including a wheelchair)?: A Little Help needed standing up from a chair using your arms (e.g., wheelchair or bedside chair)?: A Little Help needed to walk in hospital room?: A Little Help needed climbing 3-5 steps with a railing? : Total 6 Click Score: 15    End of Session Equipment Utilized During Treatment: Gait belt Activity Tolerance: Patient tolerated treatment well;Patient limited by fatigue Patient left: Other (comment) (On BSC with RN staff present) Nurse Communication: Mobility status PT Visit Diagnosis: Other abnormalities of gait and mobility (R26.89)     Time: 1141-1210 PT Time Calculation (min) (ACUTE ONLY): 29 min  Charges:    $Therapeutic Activity: 23-37 mins PT General Charges $$ ACUTE PT VISIT: 1 Visit                    Rankin Essex PTA 03/02/24, 1:05 PM

## 2024-03-02 NOTE — Telephone Encounter (Signed)
 Spoke to pt's wife per DPR as he is still in the hospital. I explained that Dr Jimmy said he woul dneed formal driving evaluation and gave her the name of the group in [place]. She asked that we hold on to the forms for now. I have placed them in the Waiting on Patient blue folder at my desk.

## 2024-03-02 NOTE — Telephone Encounter (Signed)
 Pt is currently in the hospital.

## 2024-03-02 NOTE — Progress Notes (Signed)
 [facility]  ROUNDING NOTE   Subjective:   Daniel Kline  is a 78 y.o.  male with diabetes mellitus type II, hypertension, glaucoma, hyperlipidemia, T cell lymphoma, rheumatoid arthritis and chronic kidney disease.  Patient presents to the emergency department complaining of dizziness and shortness of breath and has been admitted for Symptomatic bradycardia [R00.1] Symptomatic sinus bradycardia [R00.1] Acute kidney injury superimposed on chronic kidney disease (HCC) [N17.9, N18.9]  Patient is known to our practice from previous admission and has been followed outpatient by Dr. Douglas.    Update Patient seen and evaluated at bedside. Resting comfortably at the moment. Appears to be in good spirits. Creatinine down to 4.5. Lab Results  Component Value Date   CREATININE 4.47 (H) 03/02/2024   CREATININE 4.77 (H) 03/01/2024   CREATININE 5.12 (H) 02/29/2024      Objective:  Vital signs in last 24 hours:  Temp:  [97.7 F (36.5 C)-98.4 F (36.9 C)] 98 F (36.7 C) (07/25 1600) Pulse Rate:  [55-62] 55 (07/25 1600) Resp:  [18-21] 18 (07/25 1600) BP: (127-150)/(67-84) 127/68 (07/25 1600) SpO2:  [98 %-100 %] 100 % (07/25 1600) Weight:  [96.7 kg] 96.7 kg (07/25 0529)  Weight change:  Filed Weights   02/28/24 1929 03/02/24 0529  Weight: 101.6 kg 96.7 kg    Intake/Output: I/O last 3 completed shifts: In: 3 [I.V.:3] Out: 1950 [Urine:1950]   Intake/Output this shift:  Total I/O In: 1703.7 [P.O.:720; I.V.:983.7] Out: 200 [Urine:200]  Physical Exam: General: NAD  Head: Normocephalic, atraumatic. Moist oral mucosal membranes  Eyes: Anicteric  Neck: Supple  Lungs:  Diminished in bases, room air  Heart: Regular rate and rhythm  Abdomen:  Firm, nontender, distention   Extremities: Trace peripheral edema.  Neurologic: Awake, alert, conversant  Skin: Bilateral lower extremity leg wraps       Basic Metabolic Panel: Recent Labs  Lab 02/28/24 1820  02/29/24 0527 03/01/24 0844 03/02/24 0438  NA 134* 136 138 139  K 5.2* 4.5 5.0 5.2*  CL 107 107 110 109  CO2 18* 20* 21* 20*  GLUCOSE 85 169* 104* 104*  BUN 72* 69* 60* 60*  CREATININE 5.11* 5.12* 4.77* 4.47*  CALCIUM  9.2 9.2 9.5 9.6  MG 2.2  --   --   --     Liver Function Tests: Recent Labs  Lab 02/28/24 1820  AST 22  ALT 16  ALKPHOS 51  BILITOT 0.5  PROT 6.5  ALBUMIN 3.3*   No results for input(s): LIPASE, AMYLASE in the last 168 hours. No results for input(s): AMMONIA in the last 168 hours.  CBC: Recent Labs  Lab 02/28/24 1820 03/01/24 0844  WBC 5.6 5.0  NEUTROABS 3.3  --   HGB 9.8* 10.6*  HCT 30.4* 32.9*  MCV 93.0 92.2  PLT 177 182    Cardiac Enzymes: No results for input(s): CKTOTAL, CKMB, CKMBINDEX, TROPONINI in the last 168 hours.  BNP: Invalid input(s): POCBNP  CBG: Recent Labs  Lab 03/01/24 2046 03/02/24 0508 03/02/24 0751 03/02/24 1210 03/02/24 1556  GLUCAP 118* 98 89 126* 91    Microbiology: Results for orders placed or performed during the hospital encounter of 12/27/23  Aerobic/Anaerobic Culture w Gram Stain (surgical/deep wound)     Status: None   Collection Time: 12/27/23 11:30 AM   Specimen: Wound  Result Value Ref Range Status   Specimen Description   Final    WOUND Performed at [facility], [address], [place], [place] [address]  Special Requests   Final    LL Performed at [facility], [address] [place], [place] [address]    Gram Stain   Final    NO WBC SEEN ABUNDANT GRAM POSITIVE RODS FEW GRAM POSITIVE COCCI    Culture   Final    MODERATE STAPHYLOCOCCUS AUREUS ABUNDANT CORYNEBACTERIUM STRIATUM Standardized susceptibility testing for this organism is not available. NO ANAEROBES ISOLATED Performed at [facility], [address], [place], [place] [address]    Report Status 01/01/2024 FINAL  Final   Organism ID, Bacteria STAPHYLOCOCCUS AUREUS  Final       Susceptibility   Staphylococcus aureus - MIC*    CIPROFLOXACIN  <=0.5 SENSITIVE Sensitive     ERYTHROMYCIN <=0.25 SENSITIVE Sensitive     GENTAMICIN <=0.5 SENSITIVE Sensitive     OXACILLIN 1 SENSITIVE Sensitive     TETRACYCLINE <=1 SENSITIVE Sensitive     VANCOMYCIN  1 SENSITIVE Sensitive     TRIMETH/SULFA <=10 SENSITIVE Sensitive     CLINDAMYCIN <=0.25 SENSITIVE Sensitive     RIFAMPIN <=0.5 SENSITIVE Sensitive     Inducible Clindamycin NEGATIVE Sensitive     LINEZOLID 2 SENSITIVE Sensitive     * MODERATE STAPHYLOCOCCUS AUREUS  C Difficile Quick Screen w PCR reflex     Status: None   Collection Time: 12/28/23 12:03 PM   Specimen: STOOL  Result Value Ref Range Status   C Diff antigen NEGATIVE NEGATIVE Final   C Diff toxin NEGATIVE NEGATIVE Final   C Diff interpretation No C. difficile detected.  Final    Comment: Performed at [facility], [address] [place], [place] [address]    Coagulation Studies: No results for input(s): LABPROT, INR in the last 72 hours.  Urinalysis: No results for input(s): COLORURINE, LABSPEC, PHURINE, GLUCOSEU, HGBUR, BILIRUBINUR, KETONESUR, PROTEINUR, UROBILINOGEN, NITRITE, LEUKOCYTESUR in the last 72 hours.  Invalid input(s): APPERANCEUR    Imaging: ECHOCARDIOGRAM COMPLETE Result Date: 03/01/2024    ECHOCARDIOGRAM REPORT   Patient Name:   Daniel Kline Date of Exam: 03/01/2024 Medical Rec #:  [id number]        Height:       66.0 in Accession #:    [id number]       Weight:       224.0 lb Date of Birth:  1946/05/23        BSA:          2.098 m Patient Age:    78 years         BP:           104/68 mmHg Patient Gender: M                HR:           48 bpm. Exam Location:  [facility] Procedure: 2D Echo, Cardiac Doppler, Color Doppler and Intracardiac            Opacification Agent (Both Spectral and Color Flow Doppler were            utilized during procedure). Indications:     Dyspnea R06.00  History:         Patient has  prior history of Echocardiogram examinations, most                  recent 03/17/2022. Arrythmias:Bradycardia;                  Signs/Symptoms:Dyspnea.  Sonographer:     Ashley McNeely-Sloane Referring Phys:  [id number] CADENCE H FURTH Diagnosing Phys: Evalene Lunger MD IMPRESSIONS  1. Left ventricular ejection fraction, by estimation, is 60 to 65%. Left ventricular ejection fraction by 2D MOD biplane is 68.2 %. The left ventricle has normal function. The left ventricle has no regional wall motion abnormalities. Left ventricular diastolic parameters are consistent with Grade I diastolic dysfunction (impaired relaxation).  2. Right ventricular systolic function is normal. The right ventricular size is normal.  3. The mitral valve is normal in structure. Mild mitral valve regurgitation. No evidence of mitral stenosis.  4. The aortic valve is normal in structure. Aortic valve regurgitation is not visualized. No aortic stenosis is present.  5. The inferior vena cava is dilated in size with >50% respiratory variability, suggesting right atrial pressure of 8 mmHg. FINDINGS  Left Ventricle: Left ventricular ejection fraction, by estimation, is 60 to 65%. Left ventricular ejection fraction by 2D MOD biplane is 68.2 %. The left ventricle has normal function. The left ventricle has no regional wall motion abnormalities. Definity  contrast agent was given IV to delineate the left ventricular endocardial borders. Strain was performed and the global longitudinal strain is indeterminate. The left ventricular internal cavity size was normal in size. There is no left ventricular hypertrophy. Left ventricular diastolic parameters are consistent with Grade I diastolic dysfunction (impaired relaxation). Right Ventricle: The right ventricular size is normal. No increase in right ventricular wall thickness. Right ventricular systolic function is normal. Left Atrium: Left atrial size was normal in size. Right Atrium: Right atrial size was  normal in size. Pericardium: There is no evidence of pericardial effusion. Mitral Valve: The mitral valve is normal in structure. Mild mitral valve regurgitation, with eccentric medially directed jet. No evidence of mitral valve stenosis. MV peak gradient, 4.5 mmHg. The mean mitral valve gradient is 1.0 mmHg. There is no evidence of mitral valve vegetation. Tricuspid Valve: The tricuspid valve is normal in structure. Tricuspid valve regurgitation is not demonstrated. No evidence of tricuspid stenosis. Aortic Valve: The aortic valve is normal in structure. Aortic valve regurgitation is not visualized. No aortic stenosis is present. Aortic valve mean gradient measures 3.0 mmHg. Aortic valve peak gradient measures 4.9 mmHg. Aortic valve area, by VTI measures 2.40 cm. Pulmonic Valve: The pulmonic valve was normal in structure. Pulmonic valve regurgitation is not visualized. No evidence of pulmonic stenosis. Aorta: The aortic root is normal in size and structure. Venous: The inferior vena cava is dilated in size with greater than 50% respiratory variability, suggesting right atrial pressure of 8 mmHg. IAS/Shunts: No atrial level shunt detected by color flow Doppler. Additional Comments: 3D was performed not requiring image post processing on an independent workstation and was indeterminate.  LEFT VENTRICLE PLAX 2D                        Biplane EF (MOD) LVIDd:         5.10 cm         LV Biplane EF:   Left LVIDs:         3.60 cm                          ventricular LV PW:         1.00 cm                          ejection LV IVS:  0.90 cm                          fraction by LVOT diam:     1.90 cm                          2D MOD LV SV:         65                               biplane is LV SV Index:   31                               68.2 %. LVOT Area:     2.84 cm                                Diastology                                LV e' medial:    6.31 cm/s LV Volumes (MOD)               LV E/e' medial:  11.7  LV vol d, MOD    108.0 ml      LV e' lateral:   10.80 cm/s A2C:                           LV E/e' lateral: 6.8 LV vol d, MOD    124.0 ml A4C: LV vol s, MOD    34.5 ml A2C: LV vol s, MOD    40.1 ml A4C: LV SV MOD A2C:   73.5 ml LV SV MOD A4C:   124.0 ml LV SV MOD BP:    81.4 ml RIGHT VENTRICLE             IVC RV Basal diam:  4.00 cm     IVC diam: 2.30 cm RV Mid diam:    2.70 cm RV S prime:     12.70 cm/s TAPSE (M-mode): 2.7 cm LEFT ATRIUM             Index        RIGHT ATRIUM           Index LA diam:        4.20 cm 2.00 cm/m   RA Area:     18.30 cm LA Vol (A2C):   28.9 ml 13.77 ml/m  RA Volume:   44.40 ml  21.16 ml/m LA Vol (A4C):   60.2 ml 28.69 ml/m LA Biplane Vol: 45.5 ml 21.68 ml/m  AORTIC VALVE                    PULMONIC VALVE AV Area (Vmax):    2.58 cm     PV Vmax:        1.08 m/s AV Area (Vmean):   2.36 cm     PV Vmean:       69.800 cm/s AV Area (VTI):     2.40 cm     PV VTI:         0.232 m AV Vmax:  111.00 cm/s  PV Peak grad:   4.7 mmHg AV Vmean:          76.300 cm/s  PV Mean grad:   2.0 mmHg AV VTI:            0.271 m      RVOT Peak grad: 2 mmHg AV Peak Grad:      4.9 mmHg AV Mean Grad:      3.0 mmHg LVOT Vmax:         101.00 cm/s LVOT Vmean:        63.400 cm/s LVOT VTI:          0.229 m LVOT/AV VTI ratio: 0.85  AORTA Ao Root diam: 3.40 cm Ao Asc diam:  3.50 cm MITRAL VALVE MV Area (PHT): 4.49 cm    SHUNTS MV Area VTI:   1.62 cm    Systemic VTI:  0.23 m MV Peak grad:  4.5 mmHg    Systemic Diam: 1.90 cm MV Mean grad:  1.0 mmHg    Pulmonic VTI:  0.157 m MV Vmax:       1.06 m/s MV Vmean:      50.3 cm/s MV Decel Time: 169 msec MR Peak grad: 22.3 mmHg MR Mean grad: 15.0 mmHg MR Vmax:      236.00 cm/s MR Vmean:     186.0 cm/s MV E velocity: 73.90 cm/s MV A velocity: 72.30 cm/s MV E/A ratio:  1.02 Evalene Lunger MD Electronically signed by Evalene Lunger MD Signature Date/Time: 03/01/2024/4:27:33 PM    Final    US  RENAL Result Date: 03/01/2024 EXAM: RETROPERITONEAL ULTRASOUND OF THE KIDNEYS  AND URINARY BLADDER TECHNIQUE: Real-time ultrasonography of the retroperitoneum including the kidneys and urinary bladder was performed. COMPARISON: 01/01/2024 CLINICAL HISTORY: Acute kidney failure (HCC) FINDINGS: RIGHT KIDNEY: The right kidney measures 9.7 x 6.3 x 5.3 cm with a volume of 169 cc. There is right renal cortical thinning and increased renal echogenicity. An interpolar right renal 2.7 x 2.4 x 2.3 cm lesion is avascular and mildly hyperattenuating on 11/25/2023 CT. LEFT KIDNEY: The left kidney measures 9.7 x 5.7 x 4.9 cm for a volume of 142 cc. There is mild renal cortical thinning and increased renal echogenicity. A simple cyst within the lower pole measures 2.2 cm. BLADDER: The urinary bladder is normal. IMPRESSION: 1. Bilateral increased renal echogenicity and cortical thinning, suggesting medical renal disease . 2. Avascular, mildly hyperattenuating 2.7 x 2.4 x 2.3 cm interpolar right renal lesion, stable compared to 11/25/23 CT. as on that exam, this is most likely a complex cyst but technically indeterminate. Consider further evaluation with non-emergent pre- and post-contrast abdominal MRI. Alternatively, this could be followed with ultrasound at 6 months. Electronically signed by: Rockey Kilts MD 03/01/2024 12:28 PM EDT RP Workstation: [id number]     Medications:    sodium chloride  40 mL/hr at 03/02/24 1351    amLODipine   10 mg Oral Daily   calcitRIOL   0.25 mcg Oral Daily   cholecalciferol   1,000 Units Oral Daily   cyanocobalamin   1,000 mcg Oral Daily   ferrous sulfate   325 mg Oral QHS   folic acid   1 mg Oral Daily   gabapentin   100 mg Oral TID   heparin  injection (subcutaneous)  5,000 Units Subcutaneous Q8H   hydrOXYzine   50 mg Oral BID   insulin  aspart  0-15 Units Subcutaneous TID WC   insulin  aspart  0-5 Units Subcutaneous QHS   isosorbide  mononitrate  60 mg Oral QAC lunch  latanoprost   1 drop Both Eyes QHS   pantoprazole   40 mg Oral BID   rosuvastatin   10 mg Oral Daily    sodium chloride  flush  3 mL Intravenous Q12H   acetaminophen  **OR** acetaminophen , HYDROcodone -acetaminophen , melatonin, ondansetron  **OR** ondansetron  (ZOFRAN ) IV  Assessment/ Plan:  [name] is a 78 y.o.  male with diabetes mellitus type II, hypertension, glaucoma, hyperlipidemia, T cell lymphoma, rheumatoid arthritis and chronic kidney disease.  Patient presents to the emergency department complaining of dizziness and shortness of breath and has been admitted for Symptomatic bradycardia [R00.1] Symptomatic sinus bradycardia [R00.1] Acute kidney injury superimposed on chronic kidney disease (HCC) [N17.9, N18.9]    Acute Kidney Injury on chronic kidney disease stage IV with baseline creatinine 3.2 and GFR of 19 on 01/14/2024.  Acute kidney injury likely secondary to diuretics with poor oral intake.  He was experiencing bradycardia on ED arrival.  Carvedilol , losartan , furosemide  and spironolactone  were held.  Renal ultrasound negative for obstruction.   Update: Renal function improved slightly.  Creatinine down to 4.4.  No immediate need for dialysis at the moment.  Continue hydration for now.  Lab Results  Component Value Date   CREATININE 4.47 (H) 03/02/2024   CREATININE 4.77 (H) 03/01/2024   CREATININE 5.12 (H) 02/29/2024    Intake/Output Summary (Last 24 hours) at 03/02/2024 1758 Last data filed at 03/02/2024 1700 Gross per 24 hour  Intake 1706.66 ml  Output 700 ml  Net 1006.66 ml   2. Anemia of chronic kidney disease Lab Results  Component Value Date   HGB 10.6 (L) 03/01/2024    Hemoglobin acceptable at 10.6.  No immediate need for Epogen  at this time.  3.  Hypertension with chronic kidney disease.  Home regimen includes amlodipine , carvedilol , furosemide , isosorbide , losartan , and spironolactone .  Currently receiving only amlodipine  and isosorbide  due to kidney injury.  Blood pressure acceptable at 127/68.    LOS: 2 Deane Melick 7/25/20255:58 PM

## 2024-03-02 NOTE — Plan of Care (Signed)

## 2024-03-03 DIAGNOSIS — Z87898 Personal history of other specified conditions: Secondary | ICD-10-CM

## 2024-03-03 DIAGNOSIS — T447X5S Adverse effect of beta-adrenoreceptor antagonists, sequela: Secondary | ICD-10-CM | POA: Diagnosis not present

## 2024-03-03 DIAGNOSIS — G4734 Idiopathic sleep related nonobstructive alveolar hypoventilation: Secondary | ICD-10-CM

## 2024-03-03 DIAGNOSIS — E8721 Acute metabolic acidosis: Secondary | ICD-10-CM | POA: Diagnosis not present

## 2024-03-03 DIAGNOSIS — N178 Other acute kidney failure: Secondary | ICD-10-CM

## 2024-03-03 DIAGNOSIS — R001 Bradycardia, unspecified: Secondary | ICD-10-CM | POA: Diagnosis not present

## 2024-03-03 DIAGNOSIS — D638 Anemia in other chronic diseases classified elsewhere: Secondary | ICD-10-CM

## 2024-03-03 LAB — BASIC METABOLIC PANEL WITH GFR
Anion gap: 8 (ref 5–15)
BUN: 55 mg/dL — ABNORMAL HIGH (ref 8–23)
CO2: 20 mmol/L — ABNORMAL LOW (ref 22–32)
Calcium: 9.6 mg/dL (ref 8.9–10.3)
Chloride: 109 mmol/L (ref 98–111)
Creatinine, Ser: 4.1 mg/dL — ABNORMAL HIGH (ref 0.61–1.24)
GFR, Estimated: 14 mL/min — ABNORMAL LOW (ref >60)
Glucose, Bld: 94 mg/dL (ref 70–99)
Potassium: 4.3 mmol/L (ref 3.5–5.1)
Sodium: 137 mmol/L (ref 135–145)

## 2024-03-03 LAB — GLUCOSE, CAPILLARY
Glucose-Capillary: 97 mg/dL (ref 70–99)
Glucose-Capillary: 88 mg/dL (ref 70–99)
Glucose-Capillary: 123 mg/dL — ABNORMAL HIGH (ref 70–99)
Glucose-Capillary: 134 mg/dL — ABNORMAL HIGH (ref 70–99)
Glucose-Capillary: 125 mg/dL — ABNORMAL HIGH (ref 70–99)

## 2024-03-03 LAB — HEMOGLOBIN: Hemoglobin: 10.8 g/dL — ABNORMAL LOW (ref 13.0–17.0)

## 2024-03-03 NOTE — Assessment & Plan Note (Signed)
 Treated.

## 2024-03-03 NOTE — Progress Notes (Signed)
 Progress Note   Patient: Daniel Kline FMW:[id number] DOB: 1945/11/24 DOA: 02/28/2024     3 DOS: the patient was seen and examined on 03/03/2024   Brief hospital course: 78 y.o. male with medical history significant for T2DM, CKD IV, anemia of chronic disease, RA, diastolic CHF, nocturnal hypoxemia on nighttime O2 at 2 L, history of hallucinations, being admitted with symptomatic bradycardia(HR 55 with EMS) and acute worsening of CKD.  He takes Coreg  3.125 mg twice daily.  He presented by EMS with a complaint of dizziness and shortness of breath.  Most of history given by wife at bedside who stated that he was in his usual state of health until the morning of arrival when she noted that he did not seem to have an appetite then developed presenting symptoms later on in the day.  He had no complaints of chest pain, no prior cough, fever, vomiting or diarrhea no complaints of pain. In the ED heart rate 37-41, maintaining good blood pressure with systolics 113-120 and diastolic in the 60s.  Vitals otherwise unremarkable. Labs notable for troponin 10 and BNP 149 Normal WBC, with hemoglobin near baseline at 9.8 Creatinine 5.11 up from 2.71-month ago with metabolic acidosis of 18 and hyperkalemia 5.2.  Magnesium  normal at 2.2. EKG showed sinus bradycardia at 38 Chest x-ray with mild cardiomegaly and nonacute. Patient was treated with a 500 mL liter NS bolus and given a gram of calcium  gluconate to treat hyperkalemia.  7/23 creatinine 5.12 7/24 creatinine 4.77 7/25 creatinine 4.47 7/26 creatinine 4.1   Assessment and Plan: * Symptomatic bradycardia Beta-blocker intolerance Heart rate initially in the high 30s to low 40s Carvedilol  held. Heart rate improved.   Acute renal failure superimposed on stage 4 chronic kidney disease (HCC) Acute metabolic acidosis Hyperkalemia Patient on gentle fluids.  Nephrology following.  Creatinine upon presentation 5.11 and down to  4.10.   Hyperkalemia Treated  Acute metabolic acidosis Secondary to acute kidney injury.  Chronic diastolic CHF (congestive heart failure) (HCC) Chronic nocturnal hypoxemia Watch closely with IV fluid hydration.  Last EF 60%. Holding Coreg , furosemide  due to bradycardia and AKI respectively   Anemia of chronic disease Last hemoglobin 10.8  Dizziness Likely secondary to low heart rate.  Continue working with PT  Type 2 diabetes mellitus with diabetic nephropathy (HCC) Sliding scale insulin  coverage  Essential hypertension Patient on Norvasc   Rheumatoid arthritis (HCC) Chronic pain Continue hydrocodone  as needed No longer on methotrexate   T-cell lymphoma (HCC) Has outpatient appointment next week  History of hallucinations Delirium precaution        Subjective: Patient feels okay.  Offers no complaints.  Legs covered.  Admitted with bradycardia and acute kidney injury.  Physical Exam: Vitals:   03/03/24 0503 03/03/24 0803 03/03/24 1139 03/03/24 1145  BP:  (!) 158/72 (!) 160/78 127/70  Pulse:  (!) 55 65 95  Resp:  12 14 12   Temp:  98 F (36.7 C) 97.8 F (36.6 C) 99.2 F (37.3 C)  TempSrc:      SpO2:  99% 99% 96%  Weight: 97.4 kg     Height:       Physical Exam HENT:     Head: Normocephalic.  Eyes:     General: Lids are normal.     Conjunctiva/sclera: Conjunctivae normal.  Cardiovascular:     Rate and Rhythm: Normal rate and regular rhythm.     Heart sounds: Normal heart sounds, S1 normal and S2 normal.  Pulmonary:     Breath  sounds: No decreased breath sounds, wheezing, rhonchi or rales.  Abdominal:     Palpations: Abdomen is soft.     Tenderness: There is no abdominal tenderness.  Musculoskeletal:     Right lower leg: No swelling.     Left lower leg: No swelling.  Skin:    General: Skin is warm.     Comments: Bilateral legs covered  Neurological:     Mental Status: He is alert.     Data Reviewed: Creatinine 4.1, BUN 55, potassium 4.3,  hemoglobin 10.8 Family Communication: Updated wife on the phone  Disposition: Status is: Inpatient Remains inpatient appropriate because: Continue gentle IV fluid hydration to try to get creatinine as good as we can.  Planned Discharge Destination: Rehab versus home with home health    Time spent: 28 minutes  Author: Charlie Patterson, MD 03/03/2024 1:55 PM  For on call review [email or web address].

## 2024-03-03 NOTE — Hospital Course (Signed)
 78 y.o. male with medical history significant for T2DM, CKD IV, anemia of chronic disease, RA, diastolic CHF, nocturnal hypoxemia on nighttime O2 at 2 L, history of hallucinations, being admitted with symptomatic bradycardia(HR 26 with EMS) and acute worsening of CKD.  He takes Coreg  3.125 mg twice daily.  He presented by EMS with a complaint of dizziness and shortness of breath.  Most of history given by wife at bedside who stated that he was in his usual state of health until the morning of arrival when she noted that he did not seem to have an appetite then developed presenting symptoms later on in the day.  He had no complaints of chest pain, no prior cough, fever, vomiting or diarrhea no complaints of pain. In the ED heart rate 37-41, maintaining good blood pressure with systolics 113-120 and diastolic in the 60s.  Vitals otherwise unremarkable. Labs notable for troponin 10 and BNP 149 Normal WBC, with hemoglobin near baseline at 9.8 Creatinine 5.11 up from 2.25-month ago with metabolic acidosis of 18 and hyperkalemia 5.2.  Magnesium  normal at 2.2. EKG showed sinus bradycardia at 38 Chest x-ray with mild cardiomegaly and nonacute. Patient was treated with a 500 mL liter NS bolus and given a gram of calcium  gluconate to treat hyperkalemia.  7/23 creatinine 5.12 7/24 creatinine 4.77 7/25 creatinine 4.47 7/26 creatinine 4.1

## 2024-03-03 NOTE — Assessment & Plan Note (Signed)
Secondary to acute kidney injury   

## 2024-03-03 NOTE — Assessment & Plan Note (Signed)
 Has outpatient appointment next week

## 2024-03-03 NOTE — Progress Notes (Addendum)
 [facility]  ROUNDING NOTE   Subjective:   Daniel Kline  is a 78 y.o.  male with diabetes mellitus type II, hypertension, glaucoma, hyperlipidemia, T cell lymphoma, rheumatoid arthritis and chronic kidney disease.  Patient presents to the emergency department complaining of dizziness and shortness of breath and has been admitted for Symptomatic bradycardia [R00.1] Symptomatic sinus bradycardia [R00.1] Acute kidney injury superimposed on chronic kidney disease (HCC) [N17.9, N18.9]  Patient is known to our practice from previous admission and has been followed outpatient by Dr. Douglas.    Update  Patient seen sitting up in bed Alert and oriented Currently eating breakfast States he feels well since admission Remains on room air  Lab Results  Component Value Date   CREATININE 4.10 (H) 03/03/2024   CREATININE 4.47 (H) 03/02/2024   CREATININE 4.77 (H) 03/01/2024      Objective:  Vital signs in last 24 hours:  Temp:  [97.8 F (36.6 C)-98.4 F (36.9 C)] 97.8 F (36.6 C) (07/26 1139) Pulse Rate:  [55-65] 65 (07/26 1139) Resp:  [12-21] 14 (07/26 1139) BP: (127-160)/(68-84) 160/78 (07/26 1139) SpO2:  [98 %-100 %] 99 % (07/26 1139) Weight:  [97.4 kg] 97.4 kg (07/26 0503)  Weight change: 0.733 kg Filed Weights   02/28/24 1929 03/02/24 0529 03/03/24 0503  Weight: 101.6 kg 96.7 kg 97.4 kg    Intake/Output: I/O last 3 completed shifts: In: 1949.7 [P.O.:960; I.V.:989.7] Out: 700 [Urine:700]   Intake/Output this shift:  Total I/O In: 240 [P.O.:240] Out: 300 [Urine:300]  Physical Exam: General: NAD  Head: Normocephalic, atraumatic. Moist oral mucosal membranes  Eyes: Anicteric  Neck: Supple  Lungs:  Diminished in bases, room air  Heart: Regular rate and rhythm  Abdomen:  Firm, nontender, distention   Extremities: Trace peripheral edema.  Neurologic: Awake, alert, conversant  Skin: Bilateral lower extremity leg wraps       Basic Metabolic  Panel: Recent Labs  Lab 02/28/24 1820 02/29/24 0527 03/01/24 0844 03/02/24 0438 03/03/24 0818  NA 134* 136 138 139 137  K 5.2* 4.5 5.0 5.2* 4.3  CL 107 107 110 109 109  CO2 18* 20* 21* 20* 20*  GLUCOSE 85 169* 104* 104* 94  BUN 72* 69* 60* 60* 55*  CREATININE 5.11* 5.12* 4.77* 4.47* 4.10*  CALCIUM  9.2 9.2 9.5 9.6 9.6  MG 2.2  --   --   --   --     Liver Function Tests: Recent Labs  Lab 02/28/24 1820  AST 22  ALT 16  ALKPHOS 51  BILITOT 0.5  PROT 6.5  ALBUMIN 3.3*   No results for input(s): LIPASE, AMYLASE in the last 168 hours. No results for input(s): AMMONIA in the last 168 hours.  CBC: Recent Labs  Lab 02/28/24 1820 03/01/24 0844 03/03/24 0818  WBC 5.6 5.0  --   NEUTROABS 3.3  --   --   HGB 9.8* 10.6* 10.8*  HCT 30.4* 32.9*  --   MCV 93.0 92.2  --   PLT 177 182  --     Cardiac Enzymes: No results for input(s): CKTOTAL, CKMB, CKMBINDEX, TROPONINI in the last 168 hours.  BNP: Invalid input(s): POCBNP  CBG: Recent Labs  Lab 03/02/24 1210 03/02/24 1556 03/02/24 2051 03/03/24 0532 03/03/24 0826  GLUCAP 126* 91 146* 97 88    Microbiology: Results for orders placed or performed during the hospital encounter of 12/27/23  Aerobic/Anaerobic Culture w Gram Stain (surgical/deep wound)     Status: None   Collection  Time: 12/27/23 11:30 AM   Specimen: Wound  Result Value Ref Range Status   Specimen Description   Final    WOUND Performed at [facility], [address], [place], [place] [address]    Special Requests   Final    LL Performed at [facility], [address] [place], [place] [address]    Gram Stain   Final    NO WBC SEEN ABUNDANT GRAM POSITIVE RODS FEW GRAM POSITIVE COCCI    Culture   Final    MODERATE STAPHYLOCOCCUS AUREUS ABUNDANT CORYNEBACTERIUM STRIATUM Standardized susceptibility testing for this organism is not available. NO ANAEROBES ISOLATED Performed at [facility], [address] [place], [place] [address]    Report Status 01/01/2024 FINAL  Final   Organism ID, Bacteria STAPHYLOCOCCUS AUREUS  Final      Susceptibility   Staphylococcus aureus - MIC*    CIPROFLOXACIN  <=0.5 SENSITIVE Sensitive     ERYTHROMYCIN <=0.25 SENSITIVE Sensitive     GENTAMICIN <=0.5 SENSITIVE Sensitive     OXACILLIN 1 SENSITIVE Sensitive     TETRACYCLINE <=1 SENSITIVE Sensitive     VANCOMYCIN  1 SENSITIVE Sensitive     TRIMETH/SULFA <=10 SENSITIVE Sensitive     CLINDAMYCIN <=0.25 SENSITIVE Sensitive     RIFAMPIN <=0.5 SENSITIVE Sensitive     Inducible Clindamycin NEGATIVE Sensitive     LINEZOLID 2 SENSITIVE Sensitive     * MODERATE STAPHYLOCOCCUS AUREUS  C Difficile Quick Screen w PCR reflex     Status: None   Collection Time: 12/28/23 12:03 PM   Specimen: STOOL  Result Value Ref Range Status   C Diff antigen NEGATIVE NEGATIVE Final   C Diff toxin NEGATIVE NEGATIVE Final   C Diff interpretation No C. difficile detected.  Final    Comment: Performed at [facility], [address] [place], [place] [address]    Coagulation Studies: No results for input(s): LABPROT, INR in the last 72 hours.  Urinalysis: No results for input(s): COLORURINE, LABSPEC, PHURINE, GLUCOSEU, HGBUR, BILIRUBINUR, KETONESUR, PROTEINUR, UROBILINOGEN, NITRITE, LEUKOCYTESUR in the last 72 hours.  Invalid input(s): APPERANCEUR    Imaging: ECHOCARDIOGRAM COMPLETE Result Date: 03/01/2024    ECHOCARDIOGRAM REPORT   Patient Name:   Daniel Kline Date of Exam: 03/01/2024 Medical Rec #:  [id number]        Height:       66.0 in Accession #:    [id number]       Weight:       224.0 lb Date of Birth:  1946-01-16        BSA:          2.098 m Patient Age:    78 years         BP:           104/68 mmHg Patient Gender: M                HR:           48 bpm. Exam Location:  [facility] Procedure: 2D Echo, Cardiac Doppler, Color Doppler and Intracardiac            Opacification Agent (Both  Spectral and Color Flow Doppler were            utilized during procedure). Indications:     Dyspnea R06.00  History:         Patient has prior history of Echocardiogram examinations, most  recent 03/17/2022. Arrythmias:Bradycardia;                  Signs/Symptoms:Dyspnea.  Sonographer:     Ashley McNeely-Sloane Referring Phys:  [id number] CADENCE H FURTH Diagnosing Phys: Evalene Lunger MD IMPRESSIONS  1. Left ventricular ejection fraction, by estimation, is 60 to 65%. Left ventricular ejection fraction by 2D MOD biplane is 68.2 %. The left ventricle has normal function. The left ventricle has no regional wall motion abnormalities. Left ventricular diastolic parameters are consistent with Grade I diastolic dysfunction (impaired relaxation).  2. Right ventricular systolic function is normal. The right ventricular size is normal.  3. The mitral valve is normal in structure. Mild mitral valve regurgitation. No evidence of mitral stenosis.  4. The aortic valve is normal in structure. Aortic valve regurgitation is not visualized. No aortic stenosis is present.  5. The inferior vena cava is dilated in size with >50% respiratory variability, suggesting right atrial pressure of 8 mmHg. FINDINGS  Left Ventricle: Left ventricular ejection fraction, by estimation, is 60 to 65%. Left ventricular ejection fraction by 2D MOD biplane is 68.2 %. The left ventricle has normal function. The left ventricle has no regional wall motion abnormalities. Definity  contrast agent was given IV to delineate the left ventricular endocardial borders. Strain was performed and the global longitudinal strain is indeterminate. The left ventricular internal cavity size was normal in size. There is no left ventricular hypertrophy. Left ventricular diastolic parameters are consistent with Grade I diastolic dysfunction (impaired relaxation). Right Ventricle: The right ventricular size is normal. No increase in right ventricular wall thickness.  Right ventricular systolic function is normal. Left Atrium: Left atrial size was normal in size. Right Atrium: Right atrial size was normal in size. Pericardium: There is no evidence of pericardial effusion. Mitral Valve: The mitral valve is normal in structure. Mild mitral valve regurgitation, with eccentric medially directed jet. No evidence of mitral valve stenosis. MV peak gradient, 4.5 mmHg. The mean mitral valve gradient is 1.0 mmHg. There is no evidence of mitral valve vegetation. Tricuspid Valve: The tricuspid valve is normal in structure. Tricuspid valve regurgitation is not demonstrated. No evidence of tricuspid stenosis. Aortic Valve: The aortic valve is normal in structure. Aortic valve regurgitation is not visualized. No aortic stenosis is present. Aortic valve mean gradient measures 3.0 mmHg. Aortic valve peak gradient measures 4.9 mmHg. Aortic valve area, by VTI measures 2.40 cm. Pulmonic Valve: The pulmonic valve was normal in structure. Pulmonic valve regurgitation is not visualized. No evidence of pulmonic stenosis. Aorta: The aortic root is normal in size and structure. Venous: The inferior vena cava is dilated in size with greater than 50% respiratory variability, suggesting right atrial pressure of 8 mmHg. IAS/Shunts: No atrial level shunt detected by color flow Doppler. Additional Comments: 3D was performed not requiring image post processing on an independent workstation and was indeterminate.  LEFT VENTRICLE PLAX 2D                        Biplane EF (MOD) LVIDd:         5.10 cm         LV Biplane EF:   Left LVIDs:         3.60 cm                          ventricular LV PW:         1.00 cm  ejection LV IVS:        0.90 cm                          fraction by LVOT diam:     1.90 cm                          2D MOD LV SV:         65                               biplane is LV SV Index:   31                               68.2 %. LVOT Area:     2.84 cm                                 Diastology                                LV e' medial:    6.31 cm/s LV Volumes (MOD)               LV E/e' medial:  11.7 LV vol d, MOD    108.0 ml      LV e' lateral:   10.80 cm/s A2C:                           LV E/e' lateral: 6.8 LV vol d, MOD    124.0 ml A4C: LV vol s, MOD    34.5 ml A2C: LV vol s, MOD    40.1 ml A4C: LV SV MOD A2C:   73.5 ml LV SV MOD A4C:   124.0 ml LV SV MOD BP:    81.4 ml RIGHT VENTRICLE             IVC RV Basal diam:  4.00 cm     IVC diam: 2.30 cm RV Mid diam:    2.70 cm RV S prime:     12.70 cm/s TAPSE (M-mode): 2.7 cm LEFT ATRIUM             Index        RIGHT ATRIUM           Index LA diam:        4.20 cm 2.00 cm/m   RA Area:     18.30 cm LA Vol (A2C):   28.9 ml 13.77 ml/m  RA Volume:   44.40 ml  21.16 ml/m LA Vol (A4C):   60.2 ml 28.69 ml/m LA Biplane Vol: 45.5 ml 21.68 ml/m  AORTIC VALVE                    PULMONIC VALVE AV Area (Vmax):    2.58 cm     PV Vmax:        1.08 m/s AV Area (Vmean):   2.36 cm     PV Vmean:       69.800 cm/s AV Area (VTI):     2.40 cm     PV VTI:         0.232 m  AV Vmax:           111.00 cm/s  PV Peak grad:   4.7 mmHg AV Vmean:          76.300 cm/s  PV Mean grad:   2.0 mmHg AV VTI:            0.271 m      RVOT Peak grad: 2 mmHg AV Peak Grad:      4.9 mmHg AV Mean Grad:      3.0 mmHg LVOT Vmax:         101.00 cm/s LVOT Vmean:        63.400 cm/s LVOT VTI:          0.229 m LVOT/AV VTI ratio: 0.85  AORTA Ao Root diam: 3.40 cm Ao Asc diam:  3.50 cm MITRAL VALVE MV Area (PHT): 4.49 cm    SHUNTS MV Area VTI:   1.62 cm    Systemic VTI:  0.23 m MV Peak grad:  4.5 mmHg    Systemic Diam: 1.90 cm MV Mean grad:  1.0 mmHg    Pulmonic VTI:  0.157 m MV Vmax:       1.06 m/s MV Vmean:      50.3 cm/s MV Decel Time: 169 msec MR Peak grad: 22.3 mmHg MR Mean grad: 15.0 mmHg MR Vmax:      236.00 cm/s MR Vmean:     186.0 cm/s MV E velocity: 73.90 cm/s MV A velocity: 72.30 cm/s MV E/A ratio:  1.02 Evalene Lunger MD Electronically signed by Evalene Lunger MD  Signature Date/Time: 03/01/2024/4:27:33 PM    Final      Medications:    sodium chloride  40 mL/hr at 03/02/24 1351    amLODipine   10 mg Oral Daily   calcitRIOL   0.25 mcg Oral Daily   cholecalciferol   1,000 Units Oral Daily   cyanocobalamin   1,000 mcg Oral Daily   ferrous sulfate   325 mg Oral QHS   folic acid   1 mg Oral Daily   gabapentin   100 mg Oral TID   heparin  injection (subcutaneous)  5,000 Units Subcutaneous Q8H   hydrOXYzine   50 mg Oral BID   insulin  aspart  0-15 Units Subcutaneous TID WC   insulin  aspart  0-5 Units Subcutaneous QHS   isosorbide  mononitrate  60 mg Oral QAC lunch   latanoprost   1 drop Both Eyes QHS   pantoprazole   40 mg Oral BID   rosuvastatin   10 mg Oral Daily   sodium chloride  flush  3 mL Intravenous Q12H   acetaminophen  **OR** acetaminophen , HYDROcodone -acetaminophen , melatonin, ondansetron  **OR** ondansetron  (ZOFRAN ) IV  Assessment/ Plan:  [name] is a 78 y.o.  male with diabetes mellitus type II, hypertension, glaucoma, hyperlipidemia, T cell lymphoma, rheumatoid arthritis and chronic kidney disease.  Patient presents to the emergency department complaining of dizziness and shortness of breath and has been admitted for Symptomatic bradycardia [R00.1] Symptomatic sinus bradycardia [R00.1] Acute kidney injury superimposed on chronic kidney disease (HCC) [N17.9, N18.9]    Acute Kidney Injury on chronic kidney disease stage IV with baseline creatinine 3.2 and GFR of 19 on 01/14/2024.  Acute kidney injury likely secondary to diuretics with poor oral intake.  He was experiencing bradycardia on ED arrival.  Carvedilol , losartan , furosemide  and spironolactone  were held.  Renal ultrasound negative for obstruction.   Update: Renal function continues to respond appropriately to gentle IV hydration.  Will continue for additional day.  No acute indication for dialysis.  Continue to hold  diuresis.  Lab Results  Component Value Date   CREATININE 4.10 (H)  03/03/2024   CREATININE 4.47 (H) 03/02/2024   CREATININE 4.77 (H) 03/01/2024    Intake/Output Summary (Last 24 hours) at 03/03/2024 1142 Last data filed at 03/03/2024 0939 Gross per 24 hour  Intake 1037.18 ml  Output 500 ml  Net 537.18 ml   2. Anemia of chronic kidney disease Lab Results  Component Value Date   HGB 10.8 (L) 03/03/2024    Hemoglobin acceptable at 10.8.  No immediate need for Epogen  at this time.  3.  Hypertension with chronic kidney disease.  Home regimen includes amlodipine , carvedilol , furosemide , isosorbide , losartan , and spironolactone .  Currently receiving only amlodipine  and isosorbide  due to kidney injury.  Blood pressure 158/72    LOS: 3 Joseguadalupe Stan 7/26/202511:42 AM

## 2024-03-04 DIAGNOSIS — R001 Bradycardia, unspecified: Secondary | ICD-10-CM | POA: Diagnosis not present

## 2024-03-04 LAB — BASIC METABOLIC PANEL WITH GFR
Anion gap: 8 (ref 5–15)
BUN: 52 mg/dL — ABNORMAL HIGH (ref 8–23)
CO2: 20 mmol/L — ABNORMAL LOW (ref 22–32)
Calcium: 9.7 mg/dL (ref 8.9–10.3)
Chloride: 109 mmol/L (ref 98–111)
Creatinine, Ser: 3.97 mg/dL — ABNORMAL HIGH (ref 0.61–1.24)
GFR, Estimated: 15 mL/min — ABNORMAL LOW (ref >60)
Glucose, Bld: 124 mg/dL — ABNORMAL HIGH (ref 70–99)
Potassium: 4.4 mmol/L (ref 3.5–5.1)
Sodium: 137 mmol/L (ref 135–145)

## 2024-03-04 LAB — GLUCOSE, CAPILLARY
Glucose-Capillary: 90 mg/dL (ref 70–99)
Glucose-Capillary: 86 mg/dL (ref 70–99)
Glucose-Capillary: 161 mg/dL — ABNORMAL HIGH (ref 70–99)

## 2024-03-04 NOTE — Discharge Summary (Signed)
 Physician Discharge Summary   Patient: Daniel Kline MRN: [id number] DOB: January 07, 1946  Admit date:     02/28/2024  Discharge date: 03/04/24  Discharge Physician: Rico Massar   PCP: [name], MD   Recommendations at discharge:   Home health physical therapy Keep scheduled follow-up appointment with nephrology  Discharge Diagnoses: Principal Problem:   Symptomatic bradycardia Active Problems:   Adverse effect of beta-blocker   Acute renal failure superimposed on stage 4 chronic kidney disease (HCC)   Acute metabolic acidosis   Hyperkalemia   Chronic diastolic CHF (congestive heart failure) (HCC)   Anemia of chronic disease   Nocturnal hypoxemia   Essential hypertension   Type 2 diabetes mellitus with diabetic nephropathy (HCC)   Dizziness   Rheumatoid arthritis (HCC)   History of hallucinations   Shortness of breath   Leg swelling   T-cell lymphoma (HCC)  Resolved Problems:   * No resolved hospital problems. *  Hospital Course:  78 y.o. male with medical history significant for T2DM, CKD IV, anemia of chronic disease, RA, diastolic CHF, nocturnal hypoxemia on nighttime O2 at 2 L, history of hallucinations, being admitted with symptomatic bradycardia(HR 58 with EMS) and acute worsening of CKD.  He takes Coreg  3.125 mg twice daily.  He presented by EMS with a complaint of dizziness and shortness of breath.  Most of history given by wife at bedside who stated that he was in his usual state of health until the morning of arrival when she noted that he did not seem to have an appetite then developed presenting symptoms later on in the day.  He had no complaints of chest pain, no prior cough, fever, vomiting or diarrhea no complaints of pain. In the ED heart rate 37-41, maintaining good blood pressure with systolics 113-120 and diastolic in the 60s.  Vitals otherwise unremarkable. Labs notable for troponin 10 and BNP 149 Normal WBC, with hemoglobin near baseline at  9.8 Creatinine 5.11 up from 2.74-month ago with metabolic acidosis of 18 and hyperkalemia 5.2.  Magnesium  normal at 2.2. EKG showed sinus bradycardia at 38 Chest x-ray with mild cardiomegaly and nonacute. Patient was treated with a 500 mL liter NS bolus and given a gram of calcium  gluconate to treat hyperkalemia.   7/23 creatinine 5.12 7/24 creatinine 4.77 7/25 creatinine 4.47 7/26 creatinine 4.1 7/27 creatinine 3.97    Assessment and Plan:  * Symptomatic bradycardia Beta-blocker intolerance Secondary to carvedilol  use Heart rate initially in the high 30s to low 40s Carvedilol  held. Heart rate improved.     Acute renal failure superimposed on stage 4 chronic kidney disease (HCC) Acute metabolic acidosis Hyperkalemia Improved with gentle fluids.   Appreciate nephrology input.   Creatinine upon presentation 5.11 and down to 3.97 Hyperkalemia and metabolic acidosis have resolved Continue to hold spironolactone , furosemide  and losartan       Chronic diastolic CHF (congestive heart failure) (HCC) Chronic nocturnal hypoxemia Watch closely with IV fluid hydration.  Last EF 60%. Carvedilol , spironolactone , losartan , Farxiga and furosemide  on hold due to bradycardia and AKI     Anemia of chronic kidney disease Last hemoglobin 10.8    Dizziness Likely secondary to low heart rate.   Improved Home health PT as an outpatient    Type 2 diabetes mellitus with diabetic nephropathy (HCC) Sliding scale insulin  coverage   Essential hypertension Continue amlodipine    Rheumatoid arthritis (HCC) Chronic pain Continue hydrocodone  as needed No longer on methotrexate    T-cell lymphoma [facility]) Has outpatient appointment next  week   History of hallucinations Delirium precaution         Consultants: Nephrology Procedures performed: None Disposition: Home health Diet recommendation:  Carb modified diet DISCHARGE MEDICATION: Allergies as of 03/04/2024       Reactions    Bee Venom Anaphylaxis   Beta Adrenergic Blockers Other (See Comments)   Symptomatic bradycardia   Oxycodone  Other (See Comments)   Delusions   Hydromorphone  Other (See Comments)   hallucinating   Hydroxychloroquine    Other reaction(s): Other (See Comments) He broke out really badly.   Zolpidem Other (See Comments)        Medication List     STOP taking these medications    carvedilol  3.125 MG tablet Commonly known as: COREG    dapagliflozin propanediol 10 MG Tabs tablet Commonly known as: FARXIGA   furosemide  80 MG tablet Commonly known as: LASIX    losartan  100 MG tablet Commonly known as: COZAAR    spironolactone  25 MG tablet Commonly known as: ALDACTONE        TAKE these medications    amLODipine  10 MG tablet Commonly known as: NORVASC  Take 10 mg by mouth daily.   calcitRIOL  0.25 MCG capsule Commonly known as: ROCALTROL  Take 0.5 mcg by mouth daily.   clorazepate  7.5 MG tablet Commonly known as: TRANXENE  TAKE 1 TABLET BY MOUTH TWICE A DAY AS NEEDED FOR ANXIETY   cyanocobalamin  500 MCG tablet Commonly known as: VITAMIN B12 Take 1,000 mcg by mouth daily.   EPINEPHrine  0.3 mg/0.3 mL Soaj injection Commonly known as: EPI-PEN Inject 0.3 mg into the muscle as needed for anaphylaxis.   folic acid  1 MG tablet Commonly known as: FOLVITE  Take 1 mg by mouth daily.   gabapentin  100 MG capsule Commonly known as: NEURONTIN  Take 2 capsules (200 mg total) by mouth 3 (three) times daily.   HYDROcodone -acetaminophen  5-325 MG tablet Commonly known as: NORCO/VICODIN TAKE ONE TABLET BY MOUTH EVERY 6 HOURS AS NEEDED FOR PAIN   hydrocortisone  2.5 % cream Apply topically 3 (three) times daily as needed.   IRON PO Take 1 tablet by mouth every morning.   isosorbide  mononitrate 60 MG 24 hr tablet Commonly known as: IMDUR  Take 1 tablet (60 mg total) by mouth daily before lunch.   latanoprost  0.005 % ophthalmic solution Commonly known as: XALATAN  Place 1 drop  into both eyes at bedtime.   MAGNESIUM  PO Take 1 tablet by mouth daily.   melatonin 5 MG Tabs Take 1 tablet (5 mg total) by mouth at bedtime as needed.   OneTouch Verio test strip Generic drug: glucose blood Use to check blood sugar once daily   rosuvastatin  10 MG tablet Commonly known as: CRESTOR  TAKE ONE TABLET BY MOUTH EVERY DAY   Vitamin D -3 25 MCG (1000 UT) Caps Take 1,000 Units by mouth daily.        Discharge Exam: Filed Weights   03/02/24 0529 03/03/24 0503 03/04/24 0500  Weight: 96.7 kg 97.4 kg 99.6 kg    HEENT:     Head: Normocephalic.  Eyes:     General: Lids are normal.     Conjunctiva/sclera: Conjunctivae normal.  Cardiovascular:     Rate and Rhythm: Normal rate and regular rhythm.     Heart sounds: Normal heart sounds, S1 normal and S2 normal.  Pulmonary:     Breath sounds: No decreased breath sounds, wheezing, rhonchi or rales.  Abdominal:     Palpations: Abdomen is soft.     Tenderness: There is no abdominal tenderness.  Musculoskeletal:     Right lower leg: No swelling.     Left lower leg: No swelling.  Skin:    General: Skin is warm.     Comments: Bilateral legs wrapped Neurological:     Mental Status: He is alert.        Condition at discharge: stable  The results of significant diagnostics from this hospitalization (including imaging, microbiology, ancillary and laboratory) are listed below for reference.   Imaging Studies: ECHOCARDIOGRAM COMPLETE Result Date: 03/01/2024    ECHOCARDIOGRAM REPORT   Patient Name:   ZACKERY BRINE Date of Exam: 03/01/2024 Medical Rec #:  [id number]        Height:       66.0 in Accession #:    [id number]       Weight:       224.0 lb Date of Birth:  [birth year]        BSA:          2.098 m Patient Age:    78 years         BP:           104/68 mmHg Patient Gender: M                HR:           48 bpm. Exam Location:  [facility] Procedure: 2D Echo, Cardiac Doppler, Color Doppler and Intracardiac             Opacification Agent (Both Spectral and Color Flow Doppler were            utilized during procedure). Indications:     Dyspnea R06.00  History:         Patient has prior history of Echocardiogram examinations, most                  recent 03/17/2022. Arrythmias:Bradycardia;                  Signs/Symptoms:Dyspnea.  Sonographer:     Ashley McNeely-Sloane Referring Phys:  [id number] CADENCE H FURTH Diagnosing Phys: Evalene Lunger MD IMPRESSIONS  1. Left ventricular ejection fraction, by estimation, is 60 to 65%. Left ventricular ejection fraction by 2D MOD biplane is 68.2 %. The left ventricle has normal function. The left ventricle has no regional wall motion abnormalities. Left ventricular diastolic parameters are consistent with Grade I diastolic dysfunction (impaired relaxation).  2. Right ventricular systolic function is normal. The right ventricular size is normal.  3. The mitral valve is normal in structure. Mild mitral valve regurgitation. No evidence of mitral stenosis.  4. The aortic valve is normal in structure. Aortic valve regurgitation is not visualized. No aortic stenosis is present.  5. The inferior vena cava is dilated in size with >50% respiratory variability, suggesting right atrial pressure of 8 mmHg. FINDINGS  Left Ventricle: Left ventricular ejection fraction, by estimation, is 60 to 65%. Left ventricular ejection fraction by 2D MOD biplane is 68.2 %. The left ventricle has normal function. The left ventricle has no regional wall motion abnormalities. Definity  contrast agent was given IV to delineate the left ventricular endocardial borders. Strain was performed and the global longitudinal strain is indeterminate. The left ventricular internal cavity size was normal in size. There is no left ventricular hypertrophy. Left ventricular diastolic parameters are consistent with Grade I diastolic dysfunction (impaired relaxation). Right Ventricle: The right ventricular size is normal. No increase in right  ventricular wall thickness. Right ventricular systolic function is normal. Left  Atrium: Left atrial size was normal in size. Right Atrium: Right atrial size was normal in size. Pericardium: There is no evidence of pericardial effusion. Mitral Valve: The mitral valve is normal in structure. Mild mitral valve regurgitation, with eccentric medially directed jet. No evidence of mitral valve stenosis. MV peak gradient, 4.5 mmHg. The mean mitral valve gradient is 1.0 mmHg. There is no evidence of mitral valve vegetation. Tricuspid Valve: The tricuspid valve is normal in structure. Tricuspid valve regurgitation is not demonstrated. No evidence of tricuspid stenosis. Aortic Valve: The aortic valve is normal in structure. Aortic valve regurgitation is not visualized. No aortic stenosis is present. Aortic valve mean gradient measures 3.0 mmHg. Aortic valve peak gradient measures 4.9 mmHg. Aortic valve area, by VTI measures 2.40 cm. Pulmonic Valve: The pulmonic valve was normal in structure. Pulmonic valve regurgitation is not visualized. No evidence of pulmonic stenosis. Aorta: The aortic root is normal in size and structure. Venous: The inferior vena cava is dilated in size with greater than 50% respiratory variability, suggesting right atrial pressure of 8 mmHg. IAS/Shunts: No atrial level shunt detected by color flow Doppler. Additional Comments: 3D was performed not requiring image post processing on an independent workstation and was indeterminate.  LEFT VENTRICLE PLAX 2D                        Biplane EF (MOD) LVIDd:         5.10 cm         LV Biplane EF:   Left LVIDs:         3.60 cm                          ventricular LV PW:         1.00 cm                          ejection LV IVS:        0.90 cm                          fraction by LVOT diam:     1.90 cm                          2D MOD LV SV:         65                               biplane is LV SV Index:   31                               68.2 %. LVOT Area:      2.84 cm                                Diastology                                LV e' medial:    6.31 cm/s LV Volumes (MOD)               LV E/e' medial:  11.7 LV vol  d, MOD    108.0 ml      LV e' lateral:   10.80 cm/s A2C:                           LV E/e' lateral: 6.8 LV vol d, MOD    124.0 ml A4C: LV vol s, MOD    34.5 ml A2C: LV vol s, MOD    40.1 ml A4C: LV SV MOD A2C:   73.5 ml LV SV MOD A4C:   124.0 ml LV SV MOD BP:    81.4 ml RIGHT VENTRICLE             IVC RV Basal diam:  4.00 cm     IVC diam: 2.30 cm RV Mid diam:    2.70 cm RV S prime:     12.70 cm/s TAPSE (M-mode): 2.7 cm LEFT ATRIUM             Index        RIGHT ATRIUM           Index LA diam:        4.20 cm 2.00 cm/m   RA Area:     18.30 cm LA Vol (A2C):   28.9 ml 13.77 ml/m  RA Volume:   44.40 ml  21.16 ml/m LA Vol (A4C):   60.2 ml 28.69 ml/m LA Biplane Vol: 45.5 ml 21.68 ml/m  AORTIC VALVE                    PULMONIC VALVE AV Area (Vmax):    2.58 cm     PV Vmax:        1.08 m/s AV Area (Vmean):   2.36 cm     PV Vmean:       69.800 cm/s AV Area (VTI):     2.40 cm     PV VTI:         0.232 m AV Vmax:           111.00 cm/s  PV Peak grad:   4.7 mmHg AV Vmean:          76.300 cm/s  PV Mean grad:   2.0 mmHg AV VTI:            0.271 m      RVOT Peak grad: 2 mmHg AV Peak Grad:      4.9 mmHg AV Mean Grad:      3.0 mmHg LVOT Vmax:         101.00 cm/s LVOT Vmean:        63.400 cm/s LVOT VTI:          0.229 m LVOT/AV VTI ratio: 0.85  AORTA Ao Root diam: 3.40 cm Ao Asc diam:  3.50 cm MITRAL VALVE MV Area (PHT): 4.49 cm    SHUNTS MV Area VTI:   1.62 cm    Systemic VTI:  0.23 m MV Peak grad:  4.5 mmHg    Systemic Diam: 1.90 cm MV Mean grad:  1.0 mmHg    Pulmonic VTI:  0.157 m MV Vmax:       1.06 m/s MV Vmean:      50.3 cm/s MV Decel Time: 169 msec MR Peak grad: 22.3 mmHg MR Mean grad: 15.0 mmHg MR Vmax:      236.00 cm/s MR Vmean:     186.0 cm/s MV E velocity: 73.90 cm/s MV A velocity: 72.30 cm/s MV E/A ratio:  1.02 Timothy Gollan MD Electronically  signed by Evalene Lunger MD Signature Date/Time: 03/01/2024/4:27:33 PM    Final    US  RENAL Result Date: 03/01/2024 EXAM: RETROPERITONEAL ULTRASOUND OF THE KIDNEYS AND URINARY BLADDER TECHNIQUE: Real-time ultrasonography of the retroperitoneum including the kidneys and urinary bladder was performed. COMPARISON: 01/01/2024 CLINICAL HISTORY: Acute kidney failure (HCC) FINDINGS: RIGHT KIDNEY: The right kidney measures 9.7 x 6.3 x 5.3 cm with a volume of 169 cc. There is right renal cortical thinning and increased renal echogenicity. An interpolar right renal 2.7 x 2.4 x 2.3 cm lesion is avascular and mildly hyperattenuating on 11/25/2023 CT. LEFT KIDNEY: The left kidney measures 9.7 x 5.7 x 4.9 cm for a volume of 142 cc. There is mild renal cortical thinning and increased renal echogenicity. A simple cyst within the lower pole measures 2.2 cm. BLADDER: The urinary bladder is normal. IMPRESSION: 1. Bilateral increased renal echogenicity and cortical thinning, suggesting medical renal disease . 2. Avascular, mildly hyperattenuating 2.7 x 2.4 x 2.3 cm interpolar right renal lesion, stable compared to 11/25/23 CT. as on that exam, this is most likely a complex cyst but technically indeterminate. Consider further evaluation with non-emergent pre- and post-contrast abdominal MRI. Alternatively, this could be followed with ultrasound at 6 months. Electronically signed by: Rockey Kilts MD 03/01/2024 12:28 PM EDT RP Workstation: [id number]   DG Chest Port 1 View Result Date: 02/28/2024 CLINICAL DATA:  Weakness bradycardia EXAM: PORTABLE CHEST 1 VIEW COMPARISON:  12/27/2023 FINDINGS: Mild cardiomegaly. No acute airspace disease, pleural effusion or pneumothorax. Aortic atherosclerosis. Postsurgical changes of the proximal right humerus. IMPRESSION: No active disease. Mild cardiomegaly. Electronically Signed   By: Luke Bun M.D.   On: 02/28/2024 18:55    Microbiology: Results for orders placed or performed during the  hospital encounter of 12/27/23  Aerobic/Anaerobic Culture w Gram Stain (surgical/deep wound)     Status: None   Collection Time: 12/27/23 11:30 AM   Specimen: Wound  Result Value Ref Range Status   Specimen Description   Final    WOUND Performed at [facility], [address] [place], [place] [address]    Special Requests   Final    LL Performed at [facility], [address] [place], [place] [address]    Gram Stain   Final    NO WBC SEEN ABUNDANT GRAM POSITIVE RODS FEW GRAM POSITIVE COCCI    Culture   Final    MODERATE STAPHYLOCOCCUS AUREUS ABUNDANT CORYNEBACTERIUM STRIATUM Standardized susceptibility testing for this organism is not available. NO ANAEROBES ISOLATED Performed at [facility], [address] [place], [place] [address]    Report Status 01/01/2024 FINAL  Final   Organism ID, Bacteria STAPHYLOCOCCUS AUREUS  Final      Susceptibility   Staphylococcus aureus - MIC*    CIPROFLOXACIN  <=0.5 SENSITIVE Sensitive     ERYTHROMYCIN <=0.25 SENSITIVE Sensitive     GENTAMICIN <=0.5 SENSITIVE Sensitive     OXACILLIN 1 SENSITIVE Sensitive     TETRACYCLINE <=1 SENSITIVE Sensitive     VANCOMYCIN  1 SENSITIVE Sensitive     TRIMETH/SULFA <=10 SENSITIVE Sensitive     CLINDAMYCIN <=0.25 SENSITIVE Sensitive     RIFAMPIN <=0.5 SENSITIVE Sensitive     Inducible Clindamycin NEGATIVE Sensitive     LINEZOLID 2 SENSITIVE Sensitive     * MODERATE STAPHYLOCOCCUS AUREUS  C Difficile Quick Screen w PCR reflex     Status: None   Collection Time: 12/28/23 12:03 PM   Specimen:  STOOL  Result Value Ref Range Status   C Diff antigen NEGATIVE NEGATIVE Final   C Diff toxin NEGATIVE NEGATIVE Final   C Diff interpretation No C. difficile detected.  Final    Comment: Performed at [facility], [address] [place], [place] [address]    Labs: CBC: Recent Labs  Lab 02/28/24 1820 03/01/24 0844 03/03/24 0818  WBC 5.6 5.0  --   NEUTROABS 3.3  --    --   HGB 9.8* 10.6* 10.8*  HCT 30.4* 32.9*  --   MCV 93.0 92.2  --   PLT 177 182  --    Basic Metabolic Panel: Recent Labs  Lab 02/28/24 1820 02/29/24 0527 03/01/24 0844 03/02/24 0438 03/03/24 0818 03/04/24 0922  NA 134* 136 138 139 137 137  K 5.2* 4.5 5.0 5.2* 4.3 4.4  CL 107 107 110 109 109 109  CO2 18* 20* 21* 20* 20* 20*  GLUCOSE 85 169* 104* 104* 94 124*  BUN 72* 69* 60* 60* 55* 52*  CREATININE 5.11* 5.12* 4.77* 4.47* 4.10* 3.97*  CALCIUM  9.2 9.2 9.5 9.6 9.6 9.7  MG 2.2  --   --   --   --   --    Liver Function Tests: Recent Labs  Lab 02/28/24 1820  AST 22  ALT 16  ALKPHOS 51  BILITOT 0.5  PROT 6.5  ALBUMIN 3.3*   CBG: Recent Labs  Lab 03/03/24 1622 03/03/24 2055 03/04/24 0501 03/04/24 0751 03/04/24 1129  GLUCAP 134* 125* 90 86 161*     Discharge time spent: greater than 30 minutes.  Signed: Aimee Somerset, MD [facility] 03/04/2024

## 2024-03-04 NOTE — Progress Notes (Signed)
 Physical Therapy Treatment Patient Details Name: Daniel Kline MRN: [id number] DOB: [birth year] Today's Date: 03/04/2024   History of Present Illness Pt is a 78 y/o male admitted secondary to dizziness and symptomatic bradycardia. Cardiology and Nephrology have been consulted. PMH including but not limited to T2DM, CKD IV, anemia of chronic disease, RA, diastolic CHF, nocturnal hypoxemia on nighttime O2 at 2 L, history of hallucinations.    PT Comments  Pt in bed, ready for session.  Excited to walk.  He is able to get in/out of bed on his own.    Steady in sitting and transition to standing.  He is able to walk to bathroom to void in standing and complete x 2 laps on unit with SPC.  Stated he feels he is at baseline and wife in to observe and agrees.  Occasionally uses SPC or no AD at home.  No LOB or buckling and overall seems to have good awareness of deficits and abilities.  Will change discharge recs to HHPT and discuss with team. Does not need further equipment for home.  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     If plan is discharge home, recommend the following: Help with stairs or ramp for entrance;Assist for transportation;A little help with walking and/or transfers;A little help with bathing/dressing/bathroom   Can travel by private vehicle        Equipment Recommendations  None recommended by PT    Recommendations for Other Services       Precautions / Restrictions Precautions Precautions: Fall Recall of Precautions/Restrictions: Intact Restrictions Weight Bearing Restrictions Per Provider Order: No     Mobility  Bed Mobility Overal bed mobility: Modified Independent               Patient Response: Cooperative  Transfers Overall transfer level: Modified independent Equipment used: None Transfers: Sit to/from Stand Sit to Stand: Contact guard assist                Ambulation/Gait Ambulation/Gait assistance: Supervision Gait Distance  (Feet): 350 Feet Assistive device: Rolling walker (2 wheels) Gait Pattern/deviations: Step-to pattern Gait velocity: decreased     General Gait Details: x 2 laps with no LOB or buckling.  good confidence   Stairs             Wheelchair Mobility     Tilt Bed Tilt Bed Patient Response: Cooperative  Modified Rankin (Stroke Patients Only)       Balance Overall balance assessment: Mild deficits observed, not formally tested                                          Communication Communication Communication: No apparent difficulties  Cognition Arousal: Alert Behavior During Therapy: WFL for tasks assessed/performed   PT - Cognitive impairments: No apparent impairments                         Following commands: Intact      Cueing Cueing Techniques: Verbal cues  Exercises      General Comments        Pertinent Vitals/Pain Pain Assessment Pain Assessment: No/denies pain    Home Living                          Prior Function  PT Goals (current goals can now be found in the care plan section) Progress towards PT goals: Progressing toward goals    Frequency    Min 3X/week      PT Plan      Co-evaluation              AM-PAC PT 6 Clicks Mobility   Outcome Measure  Help needed turning from your back to your side while in a flat bed without using bedrails?: None Help needed moving from lying on your back to sitting on the side of a flat bed without using bedrails?: None Help needed moving to and from a bed to a chair (including a wheelchair)?: None Help needed standing up from a chair using your arms (e.g., wheelchair or bedside chair)?: None Help needed to walk in hospital room?: A Little Help needed climbing 3-5 steps with a railing? : A Little 6 Click Score: 22    End of Session Equipment Utilized During Treatment: Gait belt Activity Tolerance: Patient tolerated treatment  well Patient left: in bed;with call bell/phone within reach;with bed alarm set;with family/visitor present Nurse Communication: Mobility status PT Visit Diagnosis: Other abnormalities of gait and mobility (R26.89)     Time: 8946-8892 PT Time Calculation (min) (ACUTE ONLY): 14 min  Charges:    $Gait Training: 8-22 mins PT General Charges $$ ACUTE PT VISIT: 1 Visit                   Lauraine Gills, PTA 03/04/24, 11:18 AM

## 2024-03-04 NOTE — Progress Notes (Signed)
 Pt alert and oriented, but forgetful. Wife at bedside. Discharge instructions reviewed with pt and wife. Pt and wife verbalized understanding. Pt and wife verbalized understanding of need to schedule/attend follow up appointments. Pt and wife verbalized understanding of changes to medications. IV removed. IV site WNL. Pt left unit in wheelchair with NT. Pt left hospital with wife.

## 2024-03-04 NOTE — Evaluation (Signed)
 Occupational Therapy Evaluation Patient Details Name: Daniel Kline MRN: [id number] DOB: 05-25-1946 Today's Date: 03/04/2024   History of Present Illness   Pt is a 78 y/o male admitted secondary to dizziness and symptomatic bradycardia. Cardiology and Nephrology have been consulted. PMH including but not limited to T2DM, CKD IV, anemia of chronic disease, RA, diastolic CHF, nocturnal hypoxemia on nighttime O2 at 2 L, history of hallucinations.     Clinical Impressions Chart reviewed to date, pt greeted in care of PT, agreeable to OT evaluation. PTA pt required assist for LB dressing, amb with SPC household/short community distances. On this date, pt is performing ADL slightly below PLOF, has had difficulties with bathing. Pt performs toileting with supervision standing at toilet, grooming with supervision, amb with supervision-CGA with SPC. OT will follow acutely to facilitate optimal ADL function, pt will benefit from ongoing OT after discharge to improve ADL/functional mobility. Pt is left in care of PT, all needs met.      If plan is discharge home, recommend the following:   A little help with bathing/dressing/bathroom;Help with stairs or ramp for entrance;Assistance with cooking/housework     Functional Status Assessment   Patient has had a recent decline in their functional status and demonstrates the ability to make significant improvements in function in a reasonable and predictable amount of time.     Equipment Recommendations   None recommended by OT     Recommendations for Other Services         Precautions/Restrictions   Precautions Precautions: Fall Recall of Precautions/Restrictions: Intact Restrictions Weight Bearing Restrictions Per Provider Order: No     Mobility Bed Mobility Overal bed mobility: Modified Independent                  Transfers Overall transfer level: Needs assistance Equipment used: Straight cane Transfers: Sit  to/from Stand Sit to Stand: Contact guard assist                  Balance Overall balance assessment: Needs assistance Sitting-balance support: Feet supported Sitting balance-Leahy Scale: Good     Standing balance support: During functional activity, Reliant on assistive device for balance, Bilateral upper extremity supported Standing balance-Leahy Scale: Fair                             ADL either performed or assessed with clinical judgement   ADL Overall ADL's : Needs assistance/impaired     Grooming: Supervision/safety;Standing Grooming Details (indicate cue type and reason): at sink level                 Toilet Transfer: Supervision/safety;Contact guard assist;Ambulation   Toileting- Clothing Manipulation and Hygiene: Supervision/safety;Sit to/from stand       Functional mobility during ADLs: Supervision/safety;Contact guard assist [facility])       Vision         Perception         Praxis         Pertinent Vitals/Pain Pain Assessment Pain Assessment: No/denies pain     Extremity/Trunk Assessment Upper Extremity Assessment Upper Extremity Assessment: RUE deficits/detail RUE Deficits / Details: 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- appears to be at baseline   Lower Extremity Assessment Lower Extremity Assessment: Generalized weakness       Communication Communication Communication: No apparent difficulties   Cognition Arousal: Alert Behavior During Therapy: WFL for tasks assessed/performed Cognition: No apparent impairments  Following commands: Intact       Cueing  General Comments   Cueing Techniques: Verbal cues      Exercises Other Exercises Other Exercises: edu pt/wife re: role of OT, role of rehab, discharge recommendations   Shoulder Instructions      Home Living Family/patient expects to be discharged to:: Private residence Living Arrangements: Spouse/significant  other Available Help at Discharge: Family;Available 24 hours/day Type of Home: House Home Access: Stairs to enter [employer] of Steps: 4 Entrance Stairs-Rails: Right;Left;Can reach both Home Layout: One level     Bathroom Shower/Tub: Walk-in shower;Sponge bathes at baseline   [employer]: Standard     Home Equipment: [place] - single Librarian, academic (2 wheels);BSC/3in1   Additional Comments: platform RW      Prior Functioning/Environment Prior Level of Function : Needs assist             Mobility Comments: amb SPC home/short community distances ADLs Comments: assist for LB dressing, sleeps in a recliner; MOD I for UB ADLs, feeding/grooming    OT Problem List: Decreased activity tolerance;Decreased strength;Impaired balance (sitting and/or standing)   OT Treatment/Interventions: Self-care/ADL training;Therapeutic exercise;Patient/family education;DME and/or AE instruction;Cognitive remediation/compensation;Balance training;Therapeutic activities;Energy conservation      OT Goals(Current goals can be found in the care plan section)   Acute Rehab OT Goals Patient Stated Goal: go home OT Goal Formulation: With patient Time For Goal Achievement: 03/18/24 Potential to Achieve Goals: Good ADL Goals Pt Will Perform Grooming: with modified independence;sitting;standing Pt Will Perform Lower Body Dressing: with modified independence;sitting/lateral leans;sit to/from stand Pt Will Transfer to Toilet: with modified independence;ambulating Pt Will Perform Toileting - Clothing Manipulation and hygiene: with modified independence;sitting/lateral leans;sit to/from stand   OT Frequency:  Min 2X/week    Co-evaluation PT/OT/SLP Co-Evaluation/Treatment: Yes Reason for Co-Treatment:  (initial mobility)          AM-PAC OT 6 Clicks Daily Activity     Outcome Measure Help from another person eating meals?: None Help from another person taking care of  personal grooming?: None Help from another person toileting, which includes using toliet, bedpan, or urinal?: None Help from another person bathing (including washing, rinsing, drying)?: A Little Help from another person to put on and taking off regular upper body clothing?: None Help from another person to put on and taking off regular lower body clothing?: A Lot 6 Click Score: 21   End of Session Equipment Utilized During Treatment: Other (comment) [facility]) Nurse Communication: Mobility status  Activity Tolerance: Patient tolerated treatment well Patient left:  (in hallway in care of PT)  OT Visit Diagnosis: Other abnormalities of gait and mobility (R26.89)                Time: 8944-8895 OT Time Calculation (min): 9 min Charges:  OT General Charges $OT Visit: 1 Visit OT Evaluation $OT Eval Low Complexity: 1 Low  Therisa Sheffield, OTD OTR/L  03/04/24, 12:21 PM

## 2024-03-04 NOTE — TOC Progression Note (Incomplete)
 Transition of Care [facility]) - Progression Note    Patient Details  Name: Daniel Kline MRN: [id number] Date of Birth: [birth year]  Transition of Care [facility]) CM/SW Contact  Lorraine LILLETTE [place], [place] Phone Number: 03/04/2024, 11:47 AM  Clinical Narrative:    CSW received [place] from MD to assist pt/family with HHPT recommendation.                     Expected Discharge Plan and Services         Expected Discharge Date: 03/04/24                                     Social Drivers of Health (SDOH) Interventions SDOH Screenings   Food Insecurity: No Food Insecurity (02/29/2024)  Housing: Low Risk  (02/29/2024)  Transportation Needs: No Transportation Needs (02/29/2024)  Utilities: Not At Risk (02/29/2024)  Depression (PHQ2-9): Low Risk  (02/02/2024)  Recent Concern: Depression (PHQ2-9) - Medium Risk (01/20/2024)  Financial Resource Strain: Low Risk  (07/20/2017)  Social Connections: Moderately Isolated (02/29/2024)  Tobacco Use: Medium Risk (02/29/2024)    Readmission Risk Interventions    11/30/2023    2:01 PM 09/06/2022    3:47 PM 01/27/2022   12:27 PM  Readmission Risk Prevention Plan  Transportation Screening Complete Complete Complete  PCP or Specialist Appt within 3-5 Days   Complete  HRI or Home Care Consult   Complete  Social Work Consult for Recovery Care Planning/Counseling   Complete  Palliative Care Screening   Complete  Medication Review [employer]) Complete Complete Complete  PCP or Specialist appointment within 3-5 days of discharge Complete    HRI or Home Care Consult Complete    SW Recovery Care/Counseling Consult Complete Complete   Palliative Care Screening Not Applicable Not Applicable   Skilled Nursing Facility Not Applicable

## 2024-03-04 NOTE — Progress Notes (Signed)
 [facility]  ROUNDING NOTE   Subjective:   Daniel Kline  is a 78 y.o.  male with diabetes mellitus type II, hypertension, glaucoma, hyperlipidemia, T cell lymphoma, rheumatoid arthritis and chronic kidney disease.  Patient presents to the emergency department complaining of dizziness and shortness of breath and has been admitted for Symptomatic bradycardia [R00.1] Symptomatic sinus bradycardia [R00.1] Acute kidney injury superimposed on chronic kidney disease (HCC) [N17.9, N18.9]  Patient is known to our practice from previous admission and has been followed outpatient by Dr. Douglas.    Update  Patient sitting up in bed Currently eating breakfast States he feels well Denies shortness of breath, remains on room air  Lab Results  Component Value Date   CREATININE 3.97 (H) 03/04/2024   CREATININE 4.10 (H) 03/03/2024   CREATININE 4.47 (H) 03/02/2024      Objective:  Vital signs in last 24 hours:  Temp:  [97.5 F (36.4 C)-98.1 F (36.7 C)] 97.9 F (36.6 C) (07/27 1128) Pulse Rate:  [54-63] 59 (07/27 1128) Resp:  [14-20] 20 (07/27 0458) BP: (111-175)/(52-72) 142/69 (07/27 1128) SpO2:  [96 %-100 %] 96 % (07/27 1128) Weight:  [99.6 kg] 99.6 kg (07/27 0500)  Weight change: 2.167 kg Filed Weights   03/02/24 0529 03/03/24 0503 03/04/24 0500  Weight: 96.7 kg 97.4 kg 99.6 kg    Intake/Output: I/O last 3 completed shifts: In: 1077.5 [P.O.:240; I.V.:837.5] Out: 1400 [Urine:1400]   Intake/Output this shift:  Total I/O In: 240 [P.O.:240] Out: -   Physical Exam: General: NAD  Head: Normocephalic, atraumatic. Moist oral mucosal membranes  Eyes: Anicteric  Neck: Supple  Lungs:  Diminished in bases, room air  Heart: Regular rate and rhythm  Abdomen:  Firm, nontender, distention   Extremities: Trace peripheral edema.  Neurologic: Awake, alert, conversant  Skin: Bilateral lower extremity leg wraps       Basic Metabolic Panel: Recent Labs  Lab  02/28/24 1820 02/29/24 0527 03/01/24 0844 03/02/24 0438 03/03/24 0818 03/04/24 0922  NA 134* 136 138 139 137 137  K 5.2* 4.5 5.0 5.2* 4.3 4.4  CL 107 107 110 109 109 109  CO2 18* 20* 21* 20* 20* 20*  GLUCOSE 85 169* 104* 104* 94 124*  BUN 72* 69* 60* 60* 55* 52*  CREATININE 5.11* 5.12* 4.77* 4.47* 4.10* 3.97*  CALCIUM  9.2 9.2 9.5 9.6 9.6 9.7  MG 2.2  --   --   --   --   --     Liver Function Tests: Recent Labs  Lab 02/28/24 1820  AST 22  ALT 16  ALKPHOS 51  BILITOT 0.5  PROT 6.5  ALBUMIN 3.3*   No results for input(s): LIPASE, AMYLASE in the last 168 hours. No results for input(s): AMMONIA in the last 168 hours.  CBC: Recent Labs  Lab 02/28/24 1820 03/01/24 0844 03/03/24 0818  WBC 5.6 5.0  --   NEUTROABS 3.3  --   --   HGB 9.8* 10.6* 10.8*  HCT 30.4* 32.9*  --   MCV 93.0 92.2  --   PLT 177 182  --     Cardiac Enzymes: No results for input(s): CKTOTAL, CKMB, CKMBINDEX, TROPONINI in the last 168 hours.  BNP: Invalid input(s): POCBNP  CBG: Recent Labs  Lab 03/03/24 1622 03/03/24 2055 03/04/24 0501 03/04/24 0751 03/04/24 1129  GLUCAP 134* 125* 90 86 161*    Microbiology: Results for orders placed or performed during the hospital encounter of 12/27/23  Aerobic/Anaerobic Culture w Gram Stain (  surgical/deep wound)     Status: None   Collection Time: 12/27/23 11:30 AM   Specimen: Wound  Result Value Ref Range Status   Specimen Description   Final    WOUND Performed at [facility], [address], [place], [place] [address]    Special Requests   Final    LL Performed at [facility], [address] [place], [place] [address]    Gram Stain   Final    NO WBC SEEN ABUNDANT GRAM POSITIVE RODS FEW GRAM POSITIVE COCCI    Culture   Final    MODERATE STAPHYLOCOCCUS AUREUS ABUNDANT CORYNEBACTERIUM STRIATUM Standardized susceptibility testing for this organism is not available. NO ANAEROBES ISOLATED Performed at  [facility], [address] [place], [place] [address]    Report Status 01/01/2024 FINAL  Final   Organism ID, Bacteria STAPHYLOCOCCUS AUREUS  Final      Susceptibility   Staphylococcus aureus - MIC*    CIPROFLOXACIN  <=0.5 SENSITIVE Sensitive     ERYTHROMYCIN <=0.25 SENSITIVE Sensitive     GENTAMICIN <=0.5 SENSITIVE Sensitive     OXACILLIN 1 SENSITIVE Sensitive     TETRACYCLINE <=1 SENSITIVE Sensitive     VANCOMYCIN  1 SENSITIVE Sensitive     TRIMETH/SULFA <=10 SENSITIVE Sensitive     CLINDAMYCIN <=0.25 SENSITIVE Sensitive     RIFAMPIN <=0.5 SENSITIVE Sensitive     Inducible Clindamycin NEGATIVE Sensitive     LINEZOLID 2 SENSITIVE Sensitive     * MODERATE STAPHYLOCOCCUS AUREUS  C Difficile Quick Screen w PCR reflex     Status: None   Collection Time: 12/28/23 12:03 PM   Specimen: STOOL  Result Value Ref Range Status   C Diff antigen NEGATIVE NEGATIVE Final   C Diff toxin NEGATIVE NEGATIVE Final   C Diff interpretation No C. difficile detected.  Final    Comment: Performed at [facility], [address] [place], [place] [address]    Coagulation Studies: No results for input(s): LABPROT, INR in the last 72 hours.  Urinalysis: No results for input(s): COLORURINE, LABSPEC, PHURINE, GLUCOSEU, HGBUR, BILIRUBINUR, KETONESUR, PROTEINUR, UROBILINOGEN, NITRITE, LEUKOCYTESUR in the last 72 hours.  Invalid input(s): APPERANCEUR    Imaging: No results found.    Medications:      amLODipine   10 mg Oral Daily   calcitRIOL   0.25 mcg Oral Daily   cholecalciferol   1,000 Units Oral Daily   cyanocobalamin   1,000 mcg Oral Daily   ferrous sulfate   325 mg Oral QHS   folic acid   1 mg Oral Daily   gabapentin   100 mg Oral TID   heparin  injection (subcutaneous)  5,000 Units Subcutaneous Q8H   hydrOXYzine   50 mg Oral BID   insulin  aspart  0-15 Units Subcutaneous TID WC   insulin  aspart  0-5 Units Subcutaneous QHS   isosorbide  mononitrate  60  mg Oral QAC lunch   latanoprost   1 drop Both Eyes QHS   pantoprazole   40 mg Oral BID   rosuvastatin   10 mg Oral Daily   sodium chloride  flush  3 mL Intravenous Q12H   acetaminophen  **OR** acetaminophen , HYDROcodone -acetaminophen , melatonin, ondansetron  **OR** ondansetron  (ZOFRAN ) IV  Assessment/ Plan:  [name] is a 78 y.o.  male with diabetes mellitus type II, hypertension, glaucoma, hyperlipidemia, T cell lymphoma, rheumatoid arthritis and chronic kidney disease.  Patient presents to the emergency department complaining of dizziness and shortness of breath and has been admitted for Symptomatic bradycardia [R00.1] Symptomatic sinus bradycardia [  R00.1] Acute kidney injury superimposed on chronic kidney disease (HCC) [N17.9, N18.9]    Acute Kidney Injury on chronic kidney disease stage IV with baseline creatinine 3.2 and GFR of 19 on 01/14/2024.  Acute kidney injury likely secondary to diuretics with poor oral intake.  He was experiencing bradycardia on ED arrival.  Carvedilol , losartan , furosemide  and spironolactone  were held.  Renal ultrasound negative for obstruction.   Update: Creatinine continues to improve.  Patient maintaining adequate oral intake.  Will schedule follow-up appointment in our office at discharge.  Lab Results  Component Value Date   CREATININE 3.97 (H) 03/04/2024   CREATININE 4.10 (H) 03/03/2024   CREATININE 4.47 (H) 03/02/2024    Intake/Output Summary (Last 24 hours) at 03/04/2024 1221 Last data filed at 03/04/2024 0900 Gross per 24 hour  Intake 1074.49 ml  Output 1100 ml  Net -25.51 ml   2. Anemia of chronic kidney disease Lab Results  Component Value Date   HGB 10.8 (L) 03/03/2024    Hemoglobin stable, 10.8.  No immediate need for Epogen  at this time.  3.  Hypertension with chronic kidney disease.  Home regimen includes amlodipine , carvedilol , furosemide , isosorbide , losartan , and spironolactone .  Currently receiving only amlodipine  and  isosorbide  due to kidney injury.  Blood pressure 142/69.    LOS: 4 Lennox Leikam 7/27/202512:21 PM

## 2024-03-05 ENCOUNTER — Telehealth: Payer: Self-pay

## 2024-03-05 DIAGNOSIS — I5032 Chronic diastolic (congestive) heart failure: Secondary | ICD-10-CM

## 2024-03-05 DIAGNOSIS — L03115 Cellulitis of right lower limb: Secondary | ICD-10-CM

## 2024-03-05 DIAGNOSIS — E1121 Type 2 diabetes mellitus with diabetic nephropathy: Secondary | ICD-10-CM

## 2024-03-05 NOTE — Telephone Encounter (Signed)
 Copied from CRM [id number]. Topic: Clinical - Home Health Verbal Orders >> Mar 05, 2024  2:30 PM Burnard DEL wrote: Caller/Agency: Sarah/[name] Callback Number: [phone] Service Requested: Skilled Nursing/PT/Social work Frequency: Resume  services after hospital stay Any new concerns about the patient? No

## 2024-03-05 NOTE — Transitions of Care (Post Inpatient/ED Visit) (Signed)
   03/05/2024  Name: Daniel Kline MRN: [id number] DOB: 02/22/1946  Today's TOC FU Call Status: Today's TOC FU Call Status:: Unsuccessful Call (1st Attempt) Unsuccessful Call (1st Attempt) Date: 03/05/24  Attempted to reach the patient regarding the most recent Inpatient/ED visit.  Follow Up Plan: Additional outreach attempts will be made to reach the patient to complete the Transitions of Care (Post Inpatient/ED visit) call.   Arvin Seip RN, BSN, CCM [employer], Population Health Case Manager Phone: [phone]

## 2024-03-06 ENCOUNTER — Inpatient Hospital Stay

## 2024-03-06 ENCOUNTER — Telehealth: Payer: Self-pay | Admitting: *Deleted

## 2024-03-06 ENCOUNTER — Telehealth: Payer: Self-pay

## 2024-03-06 DIAGNOSIS — C84A0 Cutaneous T-cell lymphoma, unspecified, unspecified site: Secondary | ICD-10-CM | POA: Diagnosis not present

## 2024-03-06 DIAGNOSIS — I129 Hypertensive chronic kidney disease with stage 1 through stage 4 chronic kidney disease, or unspecified chronic kidney disease: Secondary | ICD-10-CM | POA: Diagnosis not present

## 2024-03-06 DIAGNOSIS — E1122 Type 2 diabetes mellitus with diabetic chronic kidney disease: Secondary | ICD-10-CM | POA: Diagnosis not present

## 2024-03-06 DIAGNOSIS — M0609 Rheumatoid arthritis without rheumatoid factor, multiple sites: Secondary | ICD-10-CM | POA: Diagnosis not present

## 2024-03-06 DIAGNOSIS — Z79899 Other long term (current) drug therapy: Secondary | ICD-10-CM | POA: Diagnosis not present

## 2024-03-06 DIAGNOSIS — C8400 Mycosis fungoides, unspecified site: Secondary | ICD-10-CM | POA: Diagnosis not present

## 2024-03-06 DIAGNOSIS — D631 Anemia in chronic kidney disease: Secondary | ICD-10-CM | POA: Diagnosis not present

## 2024-03-06 DIAGNOSIS — N184 Chronic kidney disease, stage 4 (severe): Secondary | ICD-10-CM | POA: Diagnosis not present

## 2024-03-06 LAB — HEMOGLOBIN AND HEMATOCRIT (CANCER CENTER ONLY)
HCT: 34 % — ABNORMAL LOW (ref 39.0–52.0)
Hemoglobin: 11.3 g/dL — ABNORMAL LOW (ref 13.0–17.0)

## 2024-03-06 LAB — BASIC METABOLIC PANEL - CANCER CENTER ONLY
Anion gap: 8 (ref 5–15)
BUN: 49 mg/dL — ABNORMAL HIGH (ref 8–23)
CO2: 19 mmol/L — ABNORMAL LOW (ref 22–32)
Calcium: 9.9 mg/dL (ref 8.9–10.3)
Chloride: 105 mmol/L (ref 98–111)
Creatinine: 3.87 mg/dL — ABNORMAL HIGH (ref 0.61–1.24)
GFR, Estimated: 15 mL/min — ABNORMAL LOW (ref >60)
Glucose, Bld: 187 mg/dL — ABNORMAL HIGH (ref 70–99)
Potassium: 4.3 mmol/L (ref 3.5–5.1)
Sodium: 132 mmol/L — ABNORMAL LOW (ref 135–145)

## 2024-03-06 NOTE — Telephone Encounter (Addendum)
 Copied from CRM [id number]. Topic: General - Other >> Mar 06, 2024 10:37 AM Thersia BROCKS wrote: Reason for CRM: Alana assistant to Dr.Olsen of [facility] called in regarding patient has a couple of health questions that patient wasn't able to answer would like for a nurse to give her a callback regarding this  [phone]

## 2024-03-06 NOTE — Addendum Note (Signed)
 Addended by: JIMMY ADE I on: 03/06/2024 10:07 AM   Modules accepted: Orders

## 2024-03-06 NOTE — Telephone Encounter (Signed)
 Copied from CRM [id number]. Topic: General - Other >> Mar 06, 2024  3:53 PM Suzen RAMAN wrote: Reason for CRM: Patient returning a call to the office to speak with [facility]. Per clotilda is in with another patient. Patient advised that clotilda will give him a call back as soon as she is available to do so.  CB#[phone] (M)

## 2024-03-06 NOTE — Progress Notes (Signed)
 Hgb 11.3 today, no inj given

## 2024-03-06 NOTE — Telephone Encounter (Signed)
 Spoke to Daniel Kline to give verbal orders and she stated they need a new referral put in Epic. I also advised her Dr Jimmy is retiring at the end of August and they need to get with the pt to find out what his plans are for a new PCP so the orders can be sent to that person.

## 2024-03-06 NOTE — Telephone Encounter (Signed)
 Called to speak to Daniel Kline but they said she was gone for the day and would be back tomorrow morning and would call then. I advised Dr Jimmy is clinic tomorrow morning so I cannot guarantee if I can answer when she calls.

## 2024-03-07 ENCOUNTER — Telehealth: Payer: Self-pay

## 2024-03-07 NOTE — Telephone Encounter (Signed)
 Pt called back. I explained what happened. BUt while I had him on the phone, I explained to him about the Medical Review Forms he had dropped off on 02-28-24 prior to his recent hospitalization. I told him they are overdue and he could lose his license. He needs to call the [facility] and see if he can get an extension. I still have the forms in the blue Waiting on Patient Folder in my stacker.

## 2024-03-07 NOTE — Telephone Encounter (Addendum)
 Tried to call pt on his number. No answer and no VM to leave a message. There is another message similar to this one where I had meant to call home health and dialed his number by accident. Same issue, there is no VM to leave a message that I called by mistake.

## 2024-03-07 NOTE — Transitions of Care (Post Inpatient/ED Visit) (Signed)
   03/07/2024  Name: Daniel Kline MRN: [id number] DOB: 08/06/1946  Today's TOC FU Call Status: Today's TOC FU Call Status:: Unsuccessful Call (2nd Attempt) Unsuccessful Call (2nd Attempt) Date: 03/07/24  Attempted to reach the patient regarding the most recent Inpatient/ED visit.  Follow Up Plan: Additional outreach attempts will be made to reach the patient to complete the Transitions of Care (Post Inpatient/ED visit) call.   Arvin Seip RN, BSN, CCM [employer], Population Health Case Manager Phone: [phone]

## 2024-03-08 ENCOUNTER — Telehealth: Payer: Self-pay

## 2024-03-08 NOTE — Transitions of Care (Post Inpatient/ED Visit) (Signed)
   03/08/2024  Name: Daniel Kline MRN: [id number] DOB: [birth year]  Today's TOC FU Call Status: Today's TOC FU Call Status:: Successful TOC FU Call Completed TOC FU Call Complete Date: 03/08/24 Patient's Name and Date of Birth confirmed.  Transition Care Management Follow-up Telephone Call Date of Discharge: 03/04/24 Discharge Facility: [facility]) Type of Discharge: Inpatient Admission Primary Inpatient Discharge Diagnosis:: Symptomatic bradycardia How have you been since you were released from the hospital?: Better Any questions or concerns?: No  Items Reviewed: Did you receive and understand the discharge instructions provided?: Yes Medications obtained,verified, and reconciled?: No Medications Not Reviewed Reasons:: Other: (patient refused medication review) Dietary orders reviewed?: Yes Type of Diet Ordered:: Modified Carb Do you have support at home?: Yes People in Home [RPT]: spouse Name of Support/Comfort Primary Source: Spouse [name]  Medications Reviewed Today: Patient declined medication review with nurse. States he will review with his primary care physician next week.  Advised him importance of taking medications as prescribed on discharge and to take medications according to his discharge instructions until he sees his primary doctor. He verbalized understanding.  Medications Reviewed Today   Medications were not reviewed in this encounter     Home Care and Equipment/Supplies: Were Home Health Services Ordered?: Yes Name of Home Health Agency:: [name] Has Agency set up a time to come to your home?: Yes First Home Health Visit Date:  (patient unsure of date but states they are coming back on 03/09/24) Any new equipment or medical supplies ordered?: No  Functional Questionnaire: Do you need assistance with bathing/showering or dressing?: Yes Do you need assistance with meal preparation?: Yes Do you need assistance with eating?:  No  Follow up appointments reviewed: PCP Follow-up appointment confirmed?: Yes Date of PCP follow-up appointment?: 03/12/24 Follow-up Provider: Dr. Jimmy Specialist [facility] Follow-up appointment confirmed?: Yes Date of Specialist follow-up appointment?: 03/21/24 Follow-Up Specialty Provider:: [name] Do you need transportation to your follow-up appointment?: No Do you understand care options if your condition(s) worsen?: Yes-patient verbalized understanding  SDOH Interventions Today    Flowsheet Row Most Recent Value  SDOH Interventions   Food Insecurity Interventions Intervention Not Indicated  Housing Interventions Intervention Not Indicated  Transportation Interventions Intervention Not Indicated  Utilities Interventions Intervention Not Indicated   Evalin Shawhan J. Oksana Deberry RN, MSN [facility]  [facility], [facility] RN Care Manager Direct Dial: [phone]  Fax: [phone] Website: [email or web address]

## 2024-03-08 NOTE — Patient Instructions (Signed)
 Visit Information  Thank you for taking time to visit with me today. Please don't hesitate to contact me if I can be of assistance to you before our next scheduled telephone appointment.  Your next appointment is by telephone on 03/13/24 at 1100 am with Nestora Weeks  Hold furosemide , losartan , spironolactone  until seen by your PCP or nephrologist Keep scheduled follow-up appointment with PCP Seek medical attention for lightheadedness, dizziness, fainting, chest pain or shortness of breath   The patient verbalized understanding of instructions, educational materials, and care plan provided today and DECLINED offer to receive copy of patient instructions, educational materials, and care plan.   The patient has been provided with contact information for the care management team and has been advised to call with any health related questions or concerns.   Please call the care guide team at [phone] if you need to cancel or reschedule your appointment.   Please call the Suicide and Crisis Lifeline: 988 call the [place]  National Suicide Prevention Lifeline: 4310392139 or TTY: (708)047-5189 TTY [phone]) to talk to a trained counselor if you are experiencing a Mental Health or Behavioral Health Crisis or need someone to talk to.  Aaronjames Kelsay J. Pinkney Venard RN, MSN [facility], [facility] RN Care Manager Direct Dial: [phone]  Fax: [phone] Website: [email or web address]

## 2024-03-09 ENCOUNTER — Encounter: Payer: Self-pay | Admitting: Oncology

## 2024-03-09 DIAGNOSIS — I130 Hypertensive heart and chronic kidney disease with heart failure and stage 1 through stage 4 chronic kidney disease, or unspecified chronic kidney disease: Secondary | ICD-10-CM | POA: Diagnosis not present

## 2024-03-09 DIAGNOSIS — E1122 Type 2 diabetes mellitus with diabetic chronic kidney disease: Secondary | ICD-10-CM | POA: Diagnosis not present

## 2024-03-09 DIAGNOSIS — L97818 Non-pressure chronic ulcer of other part of right lower leg with other specified severity: Secondary | ICD-10-CM | POA: Diagnosis not present

## 2024-03-09 DIAGNOSIS — L97828 Non-pressure chronic ulcer of other part of left lower leg with other specified severity: Secondary | ICD-10-CM | POA: Diagnosis not present

## 2024-03-09 DIAGNOSIS — I509 Heart failure, unspecified: Secondary | ICD-10-CM | POA: Diagnosis not present

## 2024-03-12 ENCOUNTER — Ambulatory Visit (INDEPENDENT_AMBULATORY_CARE_PROVIDER_SITE_OTHER): Admitting: Internal Medicine

## 2024-03-12 ENCOUNTER — Encounter: Payer: Self-pay | Admitting: Internal Medicine

## 2024-03-12 VITALS — BP 128/68 | HR 62 | Temp 97.8°F | Ht 66.0 in | Wt 224.6 lb

## 2024-03-12 DIAGNOSIS — I5032 Chronic diastolic (congestive) heart failure: Secondary | ICD-10-CM | POA: Diagnosis not present

## 2024-03-12 DIAGNOSIS — C84A0 Cutaneous T-cell lymphoma, unspecified, unspecified site: Secondary | ICD-10-CM

## 2024-03-12 DIAGNOSIS — R001 Bradycardia, unspecified: Secondary | ICD-10-CM

## 2024-03-12 DIAGNOSIS — N184 Chronic kidney disease, stage 4 (severe): Secondary | ICD-10-CM | POA: Diagnosis not present

## 2024-03-12 LAB — CBC
HCT: 34.7 % — ABNORMAL LOW (ref 39.0–52.0)
Hemoglobin: 11.3 g/dL — ABNORMAL LOW (ref 13.0–17.0)
MCHC: 32.6 g/dL (ref 30.0–36.0)
MCV: 91 fl (ref 78.0–100.0)
Platelets: 178 K/uL (ref 150.0–400.0)
RBC: 3.81 Mil/uL — ABNORMAL LOW (ref 4.22–5.81)
RDW: 14.8 % (ref 11.5–15.5)
WBC: 6.3 K/uL (ref 4.0–10.5)

## 2024-03-12 LAB — RENAL FUNCTION PANEL
Albumin: 3.9 g/dL (ref 3.5–5.2)
BUN: 45 mg/dL — ABNORMAL HIGH (ref 6–23)
CO2: 24 meq/L (ref 19–32)
Calcium: 10.1 mg/dL (ref 8.4–10.5)
Chloride: 107 meq/L (ref 96–112)
Creatinine, Ser: 3.3 mg/dL — ABNORMAL HIGH (ref 0.40–1.50)
GFR: 17.08 mL/min — ABNORMAL LOW (ref >60.00)
Glucose, Bld: 122 mg/dL — ABNORMAL HIGH (ref 70–99)
Phosphorus: 4.1 mg/dL (ref 2.3–4.6)
Potassium: 4.6 meq/L (ref 3.5–5.1)
Sodium: 137 meq/L (ref 135–145)

## 2024-03-12 NOTE — Progress Notes (Signed)
 Subjective:    Patient ID: Daniel Kline, male    DOB: 09-20-1945, 78 y.o.   MRN: [id number]  HPI Here for hospital follow up  Was admitted from 7/22 to 7/27 Felt bad, hands were numb, SOB, had to sit down (couldn't stay standing) Had dizziness and shortness of breath--noted to be bradycardic 30's to 40's BP was maintained CKD had worsened Carvedilol  was stopped and heart rate improved Creatinine did improve with some fluids---frm 5.11 to 3.97 Spironolactone , furosemide  and losartan  were all held (as well as farxiga) Now getting PT  Feels better now He and wife have reconciled --this is a good thing  No SOB No edema---still has UNNA boots from vascular (going back later this month) No chest pain Some dizziness but mostly better  Weight today 222# at home Weighs daily  Current Outpatient Medications on File Prior to Visit  Medication Sig Dispense Refill   amLODipine  (NORVASC ) 10 MG tablet Take 10 mg by mouth daily.     calcitRIOL  (ROCALTROL ) 0.25 MCG capsule Take 0.5 mcg by mouth daily.     Cholecalciferol  (VITAMIN D -3) 25 MCG (1000 UT) CAPS Take 1,000 Units by mouth daily.     clorazepate  (TRANXENE ) 7.5 MG tablet TAKE 1 TABLET BY MOUTH TWICE A DAY AS NEEDED FOR ANXIETY 60 tablet 0   cyanocobalamin  (VITAMIN B12) 500 MCG tablet Take 1,000 mcg by mouth daily.     EPINEPHrine  0.3 mg/0.3 mL IJ SOAJ injection Inject 0.3 mg into the muscle as needed for anaphylaxis. 1 each 5   Ferrous Sulfate  (IRON PO) Take 1 tablet by mouth every morning.     folic acid  (FOLVITE ) 1 MG tablet Take 1 mg by mouth daily.     gabapentin  (NEURONTIN ) 100 MG capsule Take 2 capsules (200 mg total) by mouth 3 (three) times daily. 180 capsule 0   glucose blood (ONETOUCH VERIO) test strip Use to check blood sugar once daily 100 each 12   HYDROcodone -acetaminophen  (NORCO/VICODIN) 5-325 MG tablet TAKE ONE TABLET BY MOUTH EVERY 6 HOURS AS NEEDED FOR PAIN 60 tablet 0   hydrocortisone  2.5 % cream Apply  topically 3 (three) times daily as needed. 56 g 3   isosorbide  mononitrate (IMDUR ) 60 MG 24 hr tablet Take 1 tablet (60 mg total) by mouth daily before lunch. 90 tablet 3   latanoprost  (XALATAN ) 0.005 % ophthalmic solution Place 1 drop into both eyes at bedtime.     MAGNESIUM  PO Take 1 tablet by mouth daily.     melatonin 5 MG TABS Take 1 tablet (5 mg total) by mouth at bedtime as needed. 30 tablet 0   rosuvastatin  (CRESTOR ) 10 MG tablet TAKE ONE TABLET BY MOUTH EVERY DAY 90 tablet 3   No current facility-administered medications on file prior to visit.    Allergies  Allergen Reactions   Bee Venom Anaphylaxis   Beta Adrenergic Blockers Other (See Comments)    Symptomatic bradycardia   Oxycodone  Other (See Comments)    Delusions   Hydromorphone  Other (See Comments)    hallucinating   Hydroxychloroquine     Other reaction(s): Other (See Comments) He broke out really badly.   Zolpidem Other (See Comments)    Past Medical History:  Diagnosis Date   Anemia    H/O   Anxiety    Arthritis    Atrial flutter (HCC)    a. s/p post ablation in 04/2017   Chronic kidney disease    Complication of anesthesia    CTCL (cutaneous  T-cell lymphoma) (HCC)    Diabetes mellitus without complication (HCC)    Diastolic dysfunction    a. 03/2022 Echo: EF 60-65%, no rwma, GrI DD, nl RV fxn.   Dysplastic nevus 12/19/2017   Right distal lat. forearm near wrist. Severe atypia, close to peripheral margin.   Dysplastic nevus 06/21/2018   Upper back right paraspinal. Severe atypia, peripheral margin involved. Excised 07/11/2018, margins free.   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 forearm. SCCis, hypertrophic.    Past Surgical History:  Procedure Laterality Date   BACK SURGERY     LUMBAR   CARDIAC ELECTROPHYSIOLOGY STUDY AND ABLATION  2019   CATARACT EXTRACTION W/PHACO Right 05/05/2022   Procedure: CATARACT EXTRACTION PHACO AND INTRAOCULAR LENS PLACEMENT (IOC) RIGHT;  Surgeon: Mittie Gaskin, MD;  Location: [facility];  Service: Ophthalmology;  Laterality: Right;  Diabetic 10.42 01:13.4   COLONOSCOPY WITH PROPOFOL  N/A 04/04/2019   Procedure: COLONOSCOPY WITH PROPOFOL ;  Surgeon: Toledo, Ladell POUR, MD;  Location: [facility];  Service: Gastroenterology;  Laterality: N/A;   ESOPHAGOGASTRODUODENOSCOPY N/A 11/28/2023   Procedure: EGD (ESOPHAGOGASTRODUODENOSCOPY);  Surgeon: Jinny Carmine, MD;  Location: [facility];  Service: Endoscopy;  Laterality: N/A;   I & D EXTREMITY Right 02/01/2020   Procedure: IRRIGATION AND DEBRIDEMENT EXTREMITY with poly exchange;  Surgeon: Mardee Lynwood SQUIBB, MD;  Location: [facility];  Service: Orthopedics;  Laterality: Right;   INCISION AND DRAINAGE     BACK-MRSA INFECTION AFTER BACK SURGERY   KNEE ARTHROPLASTY Right 01/28/2020   Procedure: COMPUTER ASSISTED TOTAL KNEE ARTHROPLASTY;  Surgeon: Mardee Lynwood SQUIBB, MD;  Location: [facility];  Service: Orthopedics;  Laterality: Right;   MOUTH SURGERY     right elbow surgery     right knee surgery     right shoulder surgery     from polio damage   TONSILLECTOMY     TOTAL HIP ARTHROPLASTY Bilateral 04/2016    Family History  Adopted: Yes    Social History   Socioeconomic History   Marital status: Married    Spouse name: Not on file   Number of children: 0   Years of education: Not on file   Highest education level: Not on file  Occupational History   Occupation: Corporate investment banker    Comment: 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   Occupation:  Engineering geologist    Comment: Retired   Occupation: Scientist, research (medical)    Comment: Retired  Tobacco Use   Smoking status: Former    Types: Cigars    Quit date: 09/23/1990    Years since quitting: 33.4    Passive exposure: Past (as a child)   Smokeless tobacco: Former    Quit date: 02/25/2006  Vaping Use   Vaping status: Never Used  Substance and Sexual Activity   Alcohol use: No    Alcohol/week: 0.0 standard drinks of alcohol   Drug use: No   Sexual activity: Yes  Partners: Female  Other Topics Concern   Not on file  Social History Narrative   Has living will   Wife is health care POA---then brother or sister   Would accept resuscitation attempts but no prolonged ventilation or tube feeds   Social Drivers of Health   Financial Resource Strain: Low Risk  (07/20/2017)   Overall Financial Resource Strain (CARDIA)    Difficulty of Paying Living Expenses: Not hard at all  Food Insecurity: No Food Insecurity (03/08/2024)   Hunger Vital Sign    Worried About Running Out of Food in the Last Year: Never true    Ran Out of Food in the Last Year: Never true  Transportation Needs: No Transportation Needs (03/08/2024)   PRAPARE - Administrator, Civil Service (Medical): No    Lack of Transportation (Non-Medical): No  Physical Activity: Not on file  Stress: Not on file  Social Connections: Moderately Isolated (02/29/2024)   Social Connection and Isolation Panel    Frequency of Communication with Friends and Family: More than three times a week    Frequency of Social Gatherings with Friends and Family: More than three times a week    Attends Religious Services: Never    Database administrator or Organizations: No    Attends Banker Meetings: Never    Marital Status: Married  Catering manager Violence: Not At Risk (03/08/2024)   Humiliation, Afraid, Rape, and Kick questionnaire    Fear of Current or Ex-Partner: No     Emotionally Abused: No    Physically Abused: No    Sexually Abused: No   Review of Systems Dermatologist wants him back on methotrexate  for T cell lymphoma--not recommended with his GFR Appetite is okay Weight still well below what was his usual Still avoids salt    Objective:   Physical Exam Constitutional:      Appearance: Normal appearance.  Cardiovascular:     Rate and Rhythm: Normal rate and regular rhythm.     Heart sounds:     No gallop.     Comments: Rate is 60 Gr 3/6 systolic murmur Pulmonary:     Effort: Pulmonary effort is normal.     Breath sounds: Normal breath sounds. No wheezing or rales.  Musculoskeletal:     Cervical back: Neck supple.     Right lower leg: No edema.     Left lower leg: No edema.  Lymphadenopathy:     Cervical: No cervical adenopathy.  Neurological:     Mental Status: He is alert.            Assessment & Plan:

## 2024-03-12 NOTE — Patient Instructions (Signed)
 Please restart the furosemide  at 40mg  a day only if your weight goes up by 3# or more

## 2024-03-12 NOTE — Assessment & Plan Note (Signed)
 Improved off the carvedilol  Will stay off any chronotropic agents

## 2024-03-12 NOTE — Assessment & Plan Note (Signed)
 Compensated now Given renal function--will only use furosemide  prn for weight gain Stay off spironolactone , losartan 

## 2024-03-12 NOTE — Assessment & Plan Note (Signed)
 Hopefully back to baseline

## 2024-03-12 NOTE — Assessment & Plan Note (Signed)
 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 

## 2024-03-13 ENCOUNTER — Ambulatory Visit: Payer: Self-pay | Admitting: Internal Medicine

## 2024-03-13 ENCOUNTER — Telehealth: Payer: Self-pay | Admitting: *Deleted

## 2024-03-13 ENCOUNTER — Telehealth: Payer: Self-pay

## 2024-03-13 ENCOUNTER — Telehealth: Payer: Self-pay | Admitting: Internal Medicine

## 2024-03-13 NOTE — Telephone Encounter (Signed)
 Left message to call office as the VM message did not provide a name.

## 2024-03-13 NOTE — Telephone Encounter (Unsigned)
 Copied from CRM #[id number]. Topic: General - Call Back - No Documentation >> Mar 13, 2024 11:11 AM Gennette ORN wrote: Reason for CRM: Patient is returning [name] call , don't see any information.

## 2024-03-13 NOTE — Telephone Encounter (Signed)
 Copied from CRM #[id number]. Topic: Clinical - Home Health Verbal Orders >> Mar 13, 2024  9:58 AM Willma SAUNDERS wrote: Caller/Agency: [facility] Callback Number: [phone] Service Requested: Nursing Services  Frequency: 1 time a week ongoing, no end date Any new concerns about the patient? Yes, wraps on his legs, patient would not allow them to be removed last week and would like to confirm if there is anything they need to do.

## 2024-03-13 NOTE — Telephone Encounter (Signed)
 Called and spoke to Daniel Kline. Gave her the orders from Dr Jimmy. She is going to contact vascular and make sure they agree for them to do the bandages weekly. She thinks its best for him right now to not have to go there.

## 2024-03-13 NOTE — Telephone Encounter (Signed)
 Copied from CRM #[id number]. Topic: Clinical - Home Health Verbal Orders >> Mar 13, 2024  3:12 PM Larissa S wrote: Caller/Agency: [facility]  Callback Number:[phone] Service Requested: Social Work Frequency: Additional resources in community  Any new concerns about the patient? No

## 2024-03-14 ENCOUNTER — Telehealth: Payer: Self-pay

## 2024-03-14 ENCOUNTER — Telehealth: Payer: Self-pay | Admitting: Internal Medicine

## 2024-03-14 DIAGNOSIS — I509 Heart failure, unspecified: Secondary | ICD-10-CM | POA: Diagnosis not present

## 2024-03-14 DIAGNOSIS — I130 Hypertensive heart and chronic kidney disease with heart failure and stage 1 through stage 4 chronic kidney disease, or unspecified chronic kidney disease: Secondary | ICD-10-CM | POA: Diagnosis not present

## 2024-03-14 DIAGNOSIS — I872 Venous insufficiency (chronic) (peripheral): Secondary | ICD-10-CM | POA: Diagnosis not present

## 2024-03-14 DIAGNOSIS — E1122 Type 2 diabetes mellitus with diabetic chronic kidney disease: Secondary | ICD-10-CM | POA: Diagnosis not present

## 2024-03-14 DIAGNOSIS — L97828 Non-pressure chronic ulcer of other part of left lower leg with other specified severity: Secondary | ICD-10-CM | POA: Diagnosis not present

## 2024-03-14 NOTE — Telephone Encounter (Signed)
 Copied from CRM #[id number]. Topic: Clinical - Home Health Verbal Orders >> Mar 14, 2024 10:10 AM Armenia J wrote: Caller/Agency: [facility]  Callback Number: [phone] Service Requested: Physical Therapy Frequency: 1x week 4 weeks Any new concerns about the patient? Yes   High blood pressure reported at 190/84 after activity.  High blood pressure 184/80 at rest. Patient has discontinued blood pressure medication.  Voice message can be left. Number is secured.

## 2024-03-14 NOTE — Telephone Encounter (Signed)
 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

## 2024-03-14 NOTE — Telephone Encounter (Signed)
 Verbal orders given to [facility]

## 2024-03-14 NOTE — Telephone Encounter (Signed)
 Verbal orders and response from Dr Jimmy left on verified VM.

## 2024-03-17 ENCOUNTER — Other Ambulatory Visit: Payer: Self-pay | Admitting: Internal Medicine

## 2024-03-17 DIAGNOSIS — I872 Venous insufficiency (chronic) (peripheral): Secondary | ICD-10-CM | POA: Diagnosis not present

## 2024-03-17 DIAGNOSIS — L97818 Non-pressure chronic ulcer of other part of right lower leg with other specified severity: Secondary | ICD-10-CM | POA: Diagnosis not present

## 2024-03-17 DIAGNOSIS — L97828 Non-pressure chronic ulcer of other part of left lower leg with other specified severity: Secondary | ICD-10-CM | POA: Diagnosis not present

## 2024-03-17 DIAGNOSIS — I130 Hypertensive heart and chronic kidney disease with heart failure and stage 1 through stage 4 chronic kidney disease, or unspecified chronic kidney disease: Secondary | ICD-10-CM | POA: Diagnosis not present

## 2024-03-17 DIAGNOSIS — I509 Heart failure, unspecified: Secondary | ICD-10-CM | POA: Diagnosis not present

## 2024-03-17 DIAGNOSIS — E1122 Type 2 diabetes mellitus with diabetic chronic kidney disease: Secondary | ICD-10-CM | POA: Diagnosis not present

## 2024-03-19 DIAGNOSIS — I130 Hypertensive heart and chronic kidney disease with heart failure and stage 1 through stage 4 chronic kidney disease, or unspecified chronic kidney disease: Secondary | ICD-10-CM | POA: Diagnosis not present

## 2024-03-19 NOTE — Telephone Encounter (Signed)
 Tried to call pt. VM is not set up so I could not leave a message. The hydroxyzine  was discontinued by hospital doctor in July. We received a refill request today. I need to verify if he is taking it or not.

## 2024-03-21 ENCOUNTER — Ambulatory Visit: Attending: Nurse Practitioner | Admitting: Nurse Practitioner

## 2024-03-21 ENCOUNTER — Encounter: Payer: Self-pay | Admitting: Nurse Practitioner

## 2024-03-21 VITALS — BP 130/72 | HR 62 | Ht 66.0 in | Wt 224.6 lb

## 2024-03-21 DIAGNOSIS — E782 Mixed hyperlipidemia: Secondary | ICD-10-CM | POA: Diagnosis not present

## 2024-03-21 DIAGNOSIS — N184 Chronic kidney disease, stage 4 (severe): Secondary | ICD-10-CM | POA: Diagnosis not present

## 2024-03-21 DIAGNOSIS — I10 Essential (primary) hypertension: Secondary | ICD-10-CM

## 2024-03-21 DIAGNOSIS — E1121 Type 2 diabetes mellitus with diabetic nephropathy: Secondary | ICD-10-CM | POA: Diagnosis not present

## 2024-03-21 DIAGNOSIS — I5032 Chronic diastolic (congestive) heart failure: Secondary | ICD-10-CM | POA: Diagnosis not present

## 2024-03-21 DIAGNOSIS — I483 Typical atrial flutter: Secondary | ICD-10-CM | POA: Diagnosis not present

## 2024-03-21 NOTE — Patient Instructions (Signed)
 Medication Instructions:  The current medical regimen is effective;  continue present plan and medications as directed. Please refer to the Current Medication list given to you today.   *If you need a refill on your cardiac medications before your next appointment, please call your pharmacy*  Follow-Up: At [facility], you and your health needs are our priority.  As part of our continuing mission to provide you with exceptional heart care, our providers are all part of one team.  This team includes your primary Cardiologist (physician) and Advanced Practice Providers or APPs (Physician Assistants and Nurse Practitioners) who all work together to provide you with the care you need, when you need it.  Your next appointment:   3 month(s)  Provider:   Timothy Gollan, MD    We recommend signing up for the patient portal called MyChart.  Sign up information is provided on this After Visit Summary.  MyChart is used to connect with patients for Virtual Visits (Telemedicine).  Patients are able to view lab/test results, encounter notes, upcoming appointments, etc.  Non-urgent messages can be sent to your provider as well.   To learn more about what you can do with MyChart, go to [email or web address].

## 2024-03-21 NOTE — Progress Notes (Signed)
 Office Visit    Patient Name: Daniel Kline Date of Encounter: 03/21/2024  Primary Care Provider:  [name], MD Primary Cardiologist:  Evalene Lunger, MD    Chief Complaint    78 y.o. male with a history of atrial flutter status post catheter ablation in September 2018, chronic HFpEF, hypertension, hyperlipidemia, diabetes, stage IV chronic kidney disease, cutaneous T-cell lymphoma, chronic low back pain, and sleep apnea on CPAP, who presents for follow-up related to HFpEF.  Past Medical History   Subjective   Past Medical History:  Diagnosis Date   Anemia    H/O   Anxiety    Arthritis    Atrial flutter (HCC)    a. s/p post ablation in 04/2017   Chronic heart failure with preserved ejection fraction (HFpEF) (HCC)    a. 03/2022 Echo: EF 60-65%, no rwma, GrI DD, nl RV fxn; b. 02/2024 Echo: EF 60 to 65% with Gr1 DD, nl RV function, and mild MR.   CKD (chronic kidney disease), stage IV (HCC)    Complication of anesthesia    CTCL (cutaneous T-cell lymphoma) (HCC)    Diabetes mellitus without complication (HCC)    Dysplastic nevus 12/19/2017   Right distal lat. forearm near wrist. Severe atypia, close to peripheral margin.   Dysplastic nevus 06/21/2018   Upper back right paraspinal. Severe atypia, peripheral margin involved. Excised 07/11/2018, margins free.   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 forearm. SCCis, hypertrophic.   Past Surgical History:  Procedure Laterality Date   BACK  SURGERY     LUMBAR   CARDIAC ELECTROPHYSIOLOGY STUDY AND ABLATION  2019   CATARACT EXTRACTION W/PHACO Right 05/05/2022   Procedure: CATARACT EXTRACTION PHACO AND INTRAOCULAR LENS PLACEMENT (IOC) RIGHT;  Surgeon: Mittie Gaskin, MD;  Location: [facility];  Service: Ophthalmology;  Laterality: Right;  Diabetic 10.42 01:13.4   COLONOSCOPY WITH PROPOFOL  N/A 04/04/2019   Procedure: COLONOSCOPY WITH PROPOFOL ;  Surgeon: Toledo, Ladell POUR, MD;  Location: [facility] END[facility]COPY;  Service: Gastroenterology;  Laterality: N/A;   ESOPHAGOGASTRODUODENOSCOPY N/A 11/28/2023   Procedure: EGD (ESOPHAGOGASTRODUODENOSCOPY);  Surgeon: Jinny Carmine, MD;  Location: [facility];  Service: Endoscopy;  Laterality: N/A;   I & D EXTREMITY Right 02/01/2020   Procedure: IRRIGATION AND DEBRIDEMENT EXTREMITY with poly exchange;  Surgeon: Mardee Lynwood SQUIBB, MD;  Location: [facility] ORS;  Service: Orthopedics;  Laterality: Right;   INCISION AND DRAINAGE     BACK-MRSA INFECTION AFTER BACK SURGERY   KNEE ARTHROPLASTY Right 01/28/2020   Procedure: COMPUTER ASSISTED TOTAL KNEE ARTHROPLASTY;  Surgeon: Mardee Lynwood SQUIBB, MD;  Location: [facility] ORS;  Service: Orthopedics;  Laterality: Right;   MOUTH SURGERY     right elbow surgery     right knee surgery     right shoulder surgery     from polio damage   TONSILLECTOMY     TOTAL HIP ARTHROPLASTY Bilateral 04/2016    Allergies  Allergies  Allergen Reactions   Bee Venom Anaphylaxis   Beta Adrenergic Blockers Other (See Comments)    Symptomatic bradycardia   Oxycodone  Other (See Comments)    Delusions   Hydromorphone  Other (See Comments)    hallucinating   Hydroxychloroquine     Other reaction(s): Other (See Comments) He broke out really badly.   Zolpidem Other (See Comments)       History of Present Illness      78 y.o. y/o male with a history of atrial flutter status post catheter ablation in September 2018, chronic HFpEF, hypertension, hyperlipidemia, diabetes, stage IV  chronic kidney disease, cutaneous T-cell lymphoma, chronic low back pain, and sleep apnea on CPAP.  He was diagnosed with atrial flutter at the time of presentation for knee surgery in outside hospital in 2018. Echocardiogram at that time showed an EF of 60-65% with LVH, and mild pulmonary hypertension (41 mmHg). He subsequently underwent successful catheter ablation for atrial flutter in September 2018, and remained off of oral anticoagulation.   He established care with our office in June 2020 in the setting of preoperative risk stratification, and subsequently underwent right total knee arthroplasty with complicated postoperative course including bleeding requiring arthrotomy, evacuation of hematoma, and I&D. In June 2023, he was admitted to the hospital with neutropenic sepsis secondary to COVID. During admission, there was concern for possible atrial fibrillation on twelve-lead ECG, and he was placed on apixaban  therapy. Cardiology was consulted and upon our review, his rhythm was felt to be consistent with sinus rhythm and PACs, and Eliquis  was discontinued. Echocardiogram in August 2023 showed an EF of 60-65% without regional wall motion abnormalities, grade 1 diastolic dysfunction, normal RV function, and no significant valvular abnormalities. We ordered a ZIO monitor, which he was delayed in placing, but eventually wore and it did not show any clear evidence of afib (device was removed during a hospitalization and lost/never returned however, review of strips was completed on iRhythm application).  Mr. Lamadrid was admitted in January 2024 with left lower leg cellulitis and severe sepsis with altered mental status and acute kidney injury requiring IV antibiotics and IV fluids.  In December 2024, he was admitted with gastroenteritis and there was again concern for atrial fibrillation on monitoring though upon cardiology review, patient was seen to be in sinus rhythm with PACs versus wandering atrial  pacemaker and significant baseline artifact.   Mr. Heber was admitted in July 2025 with dyspnea, dizziness, weakness, and anorexia.  ER ECG showed sinus bradycardia at 38 bpm in the setting of acute kidney injury with BUN of 72 and creatinine of 5.11.  He had been on carvedilol , losartan , spironolactone , Farxiga, and torsemide in the outpatient setting.  Heart rates improved with discontinuation of carvedilol  and IV fluids.  He was markedly volume overloaded and seen by nephrology and treated with intravenous Lasix .  Echo showed an EF of 60 to 65% with grade 1 diastolic dysfunction, normal RV function, and mild MR.  Creatinine improved to 3.97 at discharge and spironolactone , furosemide , losartan , and Farxiga were stopped.  Lasix  40 mg subsequently resumed on a as needed basis only.  Follow-up labs August 4 showed improvement in creatinine to 3.30 with a BUN of 45.  Since hospital discharge, he has been more or less sedentary.  He has been feeling well without chest pain or dyspnea.  He thinks his lower extremity swelling has been well-managed with Unna boots.  He denies palpitations, PND, orthopnea, dizziness, syncope, or early satiety.  He is concerned that he has been arguing with his wife quite a bit and they have been discussing potential separation, which has been weighing on him.  Objective   Home Medications    Current Outpatient Medications  Medication Sig Dispense Refill   amLODipine  (NORVASC ) 10 MG tablet Take 10 mg by mouth daily.     calcitRIOL  (ROCALTROL ) 0.25 MCG capsule Take 0.5 mcg by mouth daily.     Cholecalciferol  (VITAMIN D -3) 25 MCG (1000 UT) CAPS Take 1,000 Units by mouth daily.     clorazepate  (TRANXENE ) 7.5 MG tablet TAKE 1 TABLET BY MOUTH TWICE A DAY AS NEEDED FOR ANXIETY 60 tablet 0   cyanocobalamin  (VITAMIN B12) 500 MCG tablet Take 1,000 mcg by mouth daily.     EPINEPHrine  0.3 mg/0.3 mL IJ SOAJ injection Inject 0.3 mg into the muscle as needed for anaphylaxis. 1 each 5    Ferrous Sulfate  (IRON PO) Take 1 tablet by mouth every morning.     folic acid  (FOLVITE ) 1 MG tablet Take 1 mg by mouth daily.     furosemide  (LASIX ) 40 MG tablet Take 40 mg by mouth daily as needed (for weight gain of 3# or more).     gabapentin  (NEURONTIN ) 100 MG capsule Take 2 capsules (200 mg total) by mouth 3 (three) times daily. 180 capsule 0   glucose blood (ONETOUCH VERIO) test strip Use to check blood sugar once daily 100 each 12   HYDROcodone -acetaminophen  (NORCO/VICODIN) 5-325 MG tablet TAKE ONE TABLET BY MOUTH EVERY 6 HOURS AS NEEDED FOR PAIN 60 tablet 0   hydrocortisone  2.5 % cream Apply topically 3 (three) times daily as needed. 56 g 3   isosorbide  mononitrate (IMDUR ) 60 MG 24 hr tablet Take 1 tablet (60 mg total) by mouth daily before lunch. 90 tablet 3   latanoprost  (XALATAN ) 0.005 % ophthalmic solution Place 1 drop into both eyes at bedtime.     MAGNESIUM  PO Take 1 tablet by mouth daily.     melatonin 5 MG TABS Take 1 tablet (5 mg total) by mouth at bedtime as needed. 30 tablet 0   rosuvastatin  (CRESTOR ) 10 MG tablet TAKE ONE TABLET BY MOUTH EVERY DAY 90 tablet 3   No current facility-administered medications for this visit.     Physical Exam    VS:  BP 130/72 (BP Location: Left Arm, Patient Position: Sitting, Cuff Size: Normal) Comment: has not taken bp meds today  Pulse 62   Ht 5' 6 (1.676 m)   Wt 224 lb 9.6 oz (101.9 kg)   SpO2 95%   BMI 36.25 kg/m  , BMI Body mass index is 36.25 kg/m.          GEN: Well nourished, well developed, in no acute distress. HEENT: normal. Neck: Supple, no JVD, carotid bruits, or masses. Cardiac: Irregular, no murmurs, rubs, or gallops. No clubbing, cyanosis, lower legs are wrapped with Unna boots.  No significant edema.  Radials 2+/PT 2+ and equal bilaterally.  Respiratory:  Respirations regular and unlabored, clear to auscultation bilaterally. GI: Soft, nontender, nondistended, BS + x 4. MS: no deformity or atrophy. Skin: warm  and dry, no rash. Neuro:  Strength and sensation are intact. Psych: Normal affect.  Accessory Clinical Findings    ECG personally reviewed by me today - EKG Interpretation Date/Time:  Wednesday March 21 2024 14:18:11 EDT Ventricular Rate:  62 PR Interval:  212 QRS Duration:  80 QT Interval:  382 QTC Calculation: 387 R Axis:   -3  Text Interpretation: Normal sinus rhythm with 1st degree A-V block Premature atrial complexes Minimal voltage criteria for LVH, may be normal variant ( R in aVL ) Confirmed by Vivienne Bruckner [phone]) on 03/21/2024 2:40:44 PM  - no acute changes.  Lab Results  Component Value Date   WBC 6.3 03/12/2024   HGB 11.3 (L) 03/12/2024   HCT 34.7 (L) 03/12/2024   MCV 91.0 03/12/2024   PLT 178.0 03/12/2024   Lab Results  Component Value Date   CREATININE 3.30 (H) 03/12/2024   BUN 45 (H) 03/12/2024   NA 137 03/12/2024   K 4.6 03/12/2024   CL 107 03/12/2024   CO2 24 03/12/2024   Lab Results  Component Value Date   ALT 16 02/28/2024   AST 22 02/28/2024   GGT 38 07/29/2023   ALKPHOS 51 02/28/2024   BILITOT 0.5 02/28/2024   Lab Results  Component Value Date   CHOL 100 03/10/2020   HDL 31.90 (L) 03/10/2020   LDLCALC 38 03/10/2020   LDLDIRECT 94.0 01/27/2017   TRIG 148.0 03/10/2020   CHOLHDL 3 03/10/2020    Lab Results  Component Value Date   HGBA1C 5.7 (H) 12/27/2023   Lab Results  Component Value Date   TSH 3.247 12/29/2023       Assessment & Plan    1.  Chronic HFpEF: Most recent echo in July 2025 showed an EF of 60 to 65% with grade 1 diastolic dysfunction and mild MR. He has a long history of lower extremity swelling managed with Unna boots and currently as needed Lasix  therapy in the setting of recent admission with acute on chronic stage IV renal failure.  Fortunately, with Unna boots in place, he has not been having significant lower extremity edema.  Exam challenging today but likely has trace edema under wraps but is not otherwise  significantly volume overloaded and his weight has been stable on his home scale at 222 pounds since hospital discharge.  He has not required any supplemental Lasix .  Heart rate and blood pressure stable.  2.  Atrial flutter: Status post catheter ablation in 2018.  He has had several hospitalizations where there was question of atrial fibrillation however on review, the rhythm was determined to be sinus rhythm with PACs and also wandering atrial pacemaker.  He has never had documented atrial fibrillation to the best of our knowledge.  in the setting of recent profound bradycardia requiring admission, carvedilol  therapy is on hold.  Sinus rhythm with PACs today.  3.  Primary hypertension: Blood pressure stable at 130/72 off of carvedilol  losartan .  4.  Hyperlipidemia: Managed with rosuvastatin  therapy.  He has not had lipids in a few years.  He is not fasting today.  LFTs were normal in July.  5.  Stage IV chronic kidney disease: Recent admission with creatinine up to 5.12 with improvement to 3.30 by labs on August 4.  Nephrotoxic agents including ARB, SGLT2 inhibitor, and MRA are on hold.  Lasix  is currently prescribed on an as-needed basis only and he has not required any up to this point.  6.  Type 2 diabetes mellitus: A1c 5.7 in May.  Diet controlled.  7.  Disposition: Follow-up in 3 months with Dr. Gollan.  Bruckner Vivienne, NP 03/21/2024, 5:24 PM

## 2024-03-22 ENCOUNTER — Other Ambulatory Visit: Payer: Self-pay

## 2024-03-22 NOTE — Patient Outreach (Signed)
 Complex Care Management   Visit Note  03/22/2024  Name:  Daniel Kline MRN: [id number] DOB: 1946/04/03  Situation: Referral received for Complex Care Management related to Heart Failure and Chronic Kidney Disease I obtained verbal consent from Patient.  Visit completed with patient  on the phone  Background:   Past Medical History:  Diagnosis Date   Anemia    H/O   Anxiety    Arthritis    Atrial flutter (HCC)    a. s/p post ablation in 04/2017   Chronic heart failure with preserved ejection fraction (HFpEF) (HCC)    a. 03/2022 Echo: EF 60-65%, no rwma, GrI DD, nl RV fxn; b. 02/2024 Echo: EF 60 to 65% with Gr1 DD, nl RV function, and mild MR.   CKD (chronic kidney disease), stage IV (HCC)    Complication of anesthesia    CTCL (cutaneous T-cell lymphoma) (HCC)    Diabetes mellitus without complication (HCC)    Dysplastic nevus 12/19/2017   Right distal lat. forearm near wrist. Severe atypia, close to peripheral margin.   Dysplastic nevus 06/21/2018   Upper back right paraspinal. Severe atypia, peripheral margin involved. Excised 07/11/2018, margins free.   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 forearm. SCCis, hypertrophic.    Assessment: Patient Reported Symptoms:  Cognitive Cognitive Status: Alert and oriented to person, place, and time, Insightful and able to interpret abstract concepts, Normal speech and language skills, Difficulties with attention and concentration, Struggling with memory  recall Cognitive/Intellectual Conditions Management [RPT]: None reported or documented in medical history or problem list   Health Maintenance Behaviors: Annual physical exam Healing Pattern: Slow Health Facilitated by: Healthy diet, Rest  Neurological Neurological Review of Symptoms: No symptoms reported Neurological Management Strategies: Routine screening Neurological Comment: fingers get numb with afib - not recently  HEENT HEENT Symptoms Reported: Not assessed      Cardiovascular Cardiovascular Symptoms Reported: Dizziness Weight: 224 lb (101.6 kg) (taken during visit) Cardiovascular Comment: hx fingers numb with afib - lasix  prn now, discussed red flags for ED, occasional brief dizziness, no sx CHF or afib  Respiratory Respiratory Symptoms Reported: Shortness of breath Other Respiratory Symptoms: DOE - no worse Respiratory Management Strategies: Routine screening  Endocrine Endocrine Symptoms Reported: No symptoms reported Is patient diabetic?: Yes Is patient checking blood sugars at home?: Yes List most recent blood sugar readings, include date and time of day: FBG 124  states normal range cannot find paper right now Endocrine Comment: discussed treatment low BG  and whe to call PCP  Gastrointestinal Gastrointestinal Symptoms Reported: Not assessed      Genitourinary Genitourinary Symptoms Reported: Not assessed Genitourinary Comment: CKD - cannot take methotrexate  for lymphoma due to kidney function  Integumentary Integumentary Symptoms Reported: Skin changes Additional Integumentary Details: lesions on skin from lymphoma - cream Skin Management Strategies: Medication therapy, Routine screening  Musculoskeletal Musculoskelatal Symptoms Reviewed: Unsteady gait, Difficulty walking Additional Musculoskeletal Details:  cane Musculoskeletal Management Strategies: Routine screening, Medical device Musculoskeletal Comment: PT Falls in the past year?: Yes Number of falls in past  year: 2 or more Was there an injury with Fall?: No Fall Risk Category Calculator: 2 Patient Fall Risk Level: Moderate Fall Risk Patient at Risk for Falls Due to: History of fall(s), Impaired balance/gait, Impaired mobility Fall risk Follow up: Falls evaluation completed, Falls prevention discussed  Psychosocial Psychosocial Symptoms Reported: No symptoms reported Additional Psychological Details: twin brother died within last 6 mos   Major Change/Loss/Stressor/Fears (CP): Death of a loved one, Medical condition, self, Medical condition, family, Relationship concerns Techniques to Cope with Loss/Stress/Change: Diversional activities Quality of Family Relationships: stressful Do you feel physically threatened by others?: No      03/22/2024    1:55 PM  Depression screen PHQ 2/9  Decreased Interest 0  Down, Depressed, Hopeless 0  PHQ - 2 Score 0    There were no vitals filed for this visit.  Medications Reviewed Today   Medications were not reviewed in this encounter     Recommendation:   PCP Follow-up Continue Current Plan of Care  Follow Up Plan:   Telephone follow-up 2 [name]  [name], MSN, RN [facility]  [facility], [facility] RN Care Manager Direct Dial: [phone] Fax: [phone]

## 2024-03-22 NOTE — Patient Instructions (Signed)
 Visit Information  Thank you for taking time to visit with me today. Please don't hesitate to contact me if I can be of assistance to you before our next scheduled appointment.  Our next appointment is by telephone on 03/29/2024 at 3:30 Please call the care guide team at [phone] if you need to cancel or reschedule your appointment.   Following is a copy of your care plan:   Goals Addressed             This Visit's Progress    [facility] RN Care Plan - CHF/falls       Problems:  Chronic Disease Management support and education needs related to CHF  Goal: Over the next 90 days the Patient will attend all scheduled medical appointments: Patient attends all appointments as evidenced by chart review        continue to work with RN Care Manager and/or Social Worker to address care management and care coordination needs related to CHF as evidenced by adherence to care management team scheduled appointments     take all medications exactly as prescribed and will call provider for medication related questions as evidenced by patient reporting compliance with all medications including prn lasix  per provider parameters weight gain >3 lbs (base weight 222)    verbalize basic understanding of CHF disease process and self health management plan as evidenced by taking all scheduled and prn medications as prescribed, calling provider with questions or concerns, following CHF Action Plan (provided) re symptoms to call provider and red flags for ED, lifestyle modifications including diet and exercise, O2 at night, cane to prevent falls and limit exertion and SOB, notify PCP immediately for any falls  Interventions:   Heart Failure Interventions: Provided education on low sodium diet Reviewed Heart Failure Action Plan in depth and provided written copy Discussed importance of daily weight and advised patient to weigh and record daily Reviewed role of diuretics in prevention of fluid overload and management  of heart failure; Discussed the importance of keeping all appointments with provider Screening for signs and symptoms of depression related to chronic disease state  Assessed social determinant of health barriers   Patient Self-Care Activities:  Attend all scheduled provider appointments Call pharmacy for medication refills 3-7 days in advance of running out of medications Call provider office for new concerns or questions  Take medications as prescribed    Plan:  Telephone follow up appointment with care management team member scheduled for:  03/29/2024 at 3:30          VBCI RN Care Plan - CKD 3b/4       Problems:  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  Goal: Over the next 90 days the Patient will attend all scheduled medical appointments: Patient will attend all appointment as evidenced by chart review        continue to work with RN Care Manager and/or Social Worker to address care management and care coordination 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 as evidenced by adherence to care management team scheduled appointments     take all medications exactly as prescribed and will call provider for medication related questions as evidenced by patient verbalizing compliance with all meds including taking furosemide  prn per parameters from provider weight gain overnight >3 lb    verbalize basic understanding of CKD Stage 3b/4 disease process and self health management plan as evidenced by taking all medications as prescribed, not adding any herbal supplements, vitamins/minerals or  OTC meds without talking with pharmacist or provider first, notify provider of any questions or concerns, lifestyle modifications including renal diet, exercise, daily weights record and bring to appointments, daily FBG record and bring to PCP appointments  Interventions:    Chronic Kidney Disease Interventions: Assessed the Patient understanding of chronic kidney disease     Evaluation of current treatment plan related to chronic kidney disease self management and patient's adherence to plan as established by provider      Provided education to patient re: stroke prevention, s/s of heart attack and stroke    Reviewed prescribed diet renal diet/low carb/low salt Reviewed medications with patient and discussed importance of compliance    Provided assistance with obtaining home blood pressure monitor via message to PCP requesting order      Counseled on the importance of exercise goals with target of 150 minutes per week     Advised patient, providing education and rationale, to monitor blood pressure daily and record, calling PCP for findings outside established parameters    Screening for signs and symptoms of depression related to chronic disease state      Assessed social determinant of health barriers    Last practice recorded BP readings:  BP Readings from Last 3 Encounters:  03/21/24 130/72  03/12/24 128/68  03/04/24 (!) 142/69   Most recent eGFR/CrCl: No results found for: EGFR  No components found for: CRCL  Patient Self-Care Activities:  Attend all scheduled provider appointments Call provider office for new concerns or questions  Take medications as prescribed    Plan:  Telephone follow up appointment with care management team member scheduled for:  03/29/2024 at 3:30             Please call the Suicide and Crisis Lifeline: 988 call the [place]  National Suicide Prevention Lifeline: 845-313-4291 or TTY: (216)325-5985 TTY 971-587-8234) to talk to a trained counselor call 1-800-273-TALK (toll free, 24 hour hotline) go to [facility] [address] [place] [phone]) call 911 if you are experiencing a Mental Health or Behavioral Health Crisis or need someone to talk to.  The patient verbalized understanding of instructions, educational materials, and care plan provided today and agreed to receive  a mailed copy of patient instructions, educational materials, and care plan.   [name], MSN, RN [place]  [facility], [facility] RN Care Manager Direct Dial: [phone] Fax: [phone] Fall Prevention in the Home, Adult Falls can cause injuries and can happen to people of all ages. There are many things you can do to make your home safer and to help prevent falls. What actions can I take to prevent falls? General information Use good lighting in all rooms. Make sure to: Replace any light bulbs that burn out. Turn on the lights in dark areas and use night-lights. Keep items that you use often in easy-to-reach places. Lower the shelves around your home if needed. Move furniture so that there are clear paths around it. Do not use throw rugs or other things on the floor that can make you trip. If any of your floors are uneven, fix them. Add color or contrast paint or tape to clearly mark and help you see: Grab bars or handrails. First and last steps of staircases. Where the edge of each step is. If you use a ladder or stepladder: Make sure that it is fully opened. Do not climb a closed ladder. Make sure the sides of the ladder are locked in  place. Have someone hold the ladder while you use it. Know where your pets are as you move through your home. What can I do in the bathroom?     Keep the floor dry. Clean up any water on the floor right away. Remove soap buildup in the bathtub or shower. Buildup makes bathtubs and showers slippery. Use non-skid mats or decals on the floor of the bathtub or shower. Attach bath mats securely with double-sided, non-slip rug tape. If you need to sit down in the shower, use a non-slip stool. Install grab bars by the toilet and in the bathtub and shower. Do not use towel bars as grab bars. What can I do in the bedroom? Make sure that you have a light by your bed that is easy to reach. Do not use any sheets or  blankets on your bed that hang to the floor. Have a firm chair or bench with side arms that you can use for support when you get dressed. What can I do in the kitchen? Clean up any spills right away. If you need to reach something above you, use a step stool with a grab bar. Keep electrical cords out of the way. Do not use floor polish or wax that makes floors slippery. What can I do with my stairs? Do not leave anything on the stairs. Make sure that you have a light switch at the top and the bottom of the stairs. Make sure that there are handrails on both sides of the stairs. Fix handrails that are broken or loose. Install non-slip stair treads on all your stairs if they do not have carpet. Avoid having throw rugs at the top or bottom of the stairs. Choose a carpet that does not hide the edge of the steps on the stairs. Make sure that the carpet is firmly attached to the stairs. Fix carpet that is loose or worn. What can I do on the outside of my home? Use bright outdoor lighting. Fix the edges of walkways and driveways and fix any cracks. Clear paths of anything that can make you trip, such as tools or rocks. Add color or contrast paint or tape to clearly mark and help you see anything that might make you trip as you walk through a door, such as a raised step or threshold. Trim any bushes or trees on paths to your home. Check to see if handrails are loose or broken and that both sides of all steps have handrails. Install guardrails along the edges of any raised decks and porches. Have leaves, snow, or ice cleared regularly. Use sand, salt, or ice melter on paths if you live where there is ice and snow during the winter. Clean up any spills in your garage right away. This includes grease or oil spills. What other actions can I take? Review your medicines with your doctor. Some medicines can cause dizziness or changes in blood pressure, which increase your risk of falling. Wear shoes  that: Have a low heel. Do not wear high heels. Have rubber bottoms and are closed at the toe. Feel good on your feet and fit well. Use tools that help you move around if needed. These include: Canes. Walkers. Scooters. Crutches. Ask your doctor what else you can do to help prevent falls. This may include seeing a physical therapist to learn to do exercises to move better and get stronger. Where to find more information Centers for Disease Control and Prevention, STEADI: TonerPromos.no [employer] on Aging:  BaseRingTones.pl [employer] on Aging: BaseRingTones.pl Contact a doctor if: You are afraid of falling at home. You feel weak, drowsy, or dizzy at home. You fall at home. Get help right away if you: Lose consciousness or have trouble moving after a fall. Have a fall that causes a head injury. These symptoms may be an emergency. Get help right away. Call 911. Do not wait to see if the symptoms will go away. Do not drive yourself to the hospital. This information is not intended to replace advice given to you by your health care provider. Make sure you discuss any questions you have with your health care provider. Document Revised: 03/29/2022 Document Reviewed: 03/29/2022 Elsevier Patient Education  2024 Elsevier Inc.  Heart Failure Action Plan A heart failure action plan helps you know what to do when you have symptoms of heart failure. Your action plan is a color-coded plan that lists the symptoms to watch for and indicates what actions to take. If you have symptoms in the green zone, you're doing well. If you have symptoms in the yellow zone, you're having problems. If you have symptoms in the red zone, you need medical care right away. Follow the plan that was created by you and your health care provider. Review your plan each time you visit your provider. Green zone These signs mean you're doing well and can continue what you're doing: You don't have new or worsening shortness  of breath. You have very little swelling or no new swelling. Your weight is stable (no gain or loss). You have a normal activity level. You don't have chest pain or any other new symptoms. Yellow zone These signs and symptoms mean your condition may be getting worse and you should make some changes: You have trouble breathing when you're active. You have swelling in your feet or legs or have discomfort in your belly. You gain 2-3 lb (0.9-1.4 kg) in 24 hours, or 5 lb (2.3 kg) in a week. This amount may be more or less depending on your condition. You get tired easily. You have trouble sleeping. You have a dry cough. If you have any of these symptoms: Contact your provider within the next day. Your provider may adjust your medicines. Red zone These signs and symptoms mean you should get medical help right away: You have trouble breathing when resting or cannot lie flat and you need to raise your head to help you breathe. You have a dry cough that's getting worse. You have swelling or pain in your feet or legs or discomfort in your belly that's getting worse. You suddenly gain more than 2-3 lb (0.9-1.4 kg) in 24 hours, or more than 5 lb (2.3 kg) in a week. This amount may be more or less depending on your condition. You have trouble staying awake or you feel confused. You don't have an appetite. You have worsening sadness or depression. These symptoms may be an emergency. Call 911 right away. Do not wait to see if the symptoms will go away. Do not drive yourself to the hospital. Follow these instructions at home: Take medicines only as told. Eat a heart-healthy diet. Work with a dietitian to create an eating plan that's best for you. Weigh yourself each day. Your target weight is __________ lb (__________ kg). Call your provider if you gain more than __________ lb (__________ kg) in 24 hours, or more than __________ lb (__________ kg) in a week. Health care provider name:  _____________________________________________________ Health care provider phone number:  _____________________________________________________ Where to find more information American Heart Association: heart.org This information is not intended to replace advice given to you by your health care provider. Make sure you discuss any questions you have with your health care provider. Document Revised: 03/10/2023 Document Reviewed: 03/10/2023 Elsevier Patient Education  2024 Elsevier Inc.  Chronic Kidney Disease: Eating Plan Chronic kidney disease (CKD) is when your kidneys aren't working well. They can't remove waste, fluids, and other substances from your blood. When these substances build up, they can worsen kidney damage and affect your health. Eating certain foods can lead to a buildup of these substances. Changing your diet can help prevent more kidney damage. Diet changes may also delay dialysis or even keep you from needing it. What nutrients should I limit? Work with your health care team and an expert in healthy eating (dietitian) to make a meal plan that's right for you. Foods you can eat and foods you should limit or avoid will depend on: The stage of your kidney disease. Any other conditions you have. The items listed below are not a complete list. Talk with a dietitian to learn what's best for you. Potassium Potassium affects how well your heart beats. Too much potassium in your blood can cause an irregular heartbeat or even a heart attack. You may need to limit foods that are high in potassium, such as: Liquid milk and soy milk. Salt substitutes that contain potassium. Fruits like: Bananas. Apricots. Melon. Prunes and raisins. Kiwi. Nectarines and oranges. Vegetables, such as: Potatoes, sweet potatoes, and yams. Tomatoes. Leafy greens. Beets. Avocado. Pumpkin and winter squash. Beans, like lima beans. Nuts. Phosphorus Phosphorus is a mineral found in your bones. You  need a balance between calcium  and phosphorus to build and maintain healthy bones.  Too much added phosphorus from the foods you eat can pull calcium  from your bones. Losing calcium  can make your bones weak and more likely to break. Too much phosphorus can also make your skin itch. You may need to limit foods that are high in phosphorus or that have added phosphorus, such as: Liquid milk and dairy products. Dark-colored sodas or soft drinks. Bran cereals and oatmeal. Protein  Protein helps your body make and keep muscle. Protein also helps to repair your body's cells and tissues.  One of the natural breakdown products of protein is a waste product called urea. When your kidneys aren't working well, they can't remove waste like urea. Reducing protein in your diet can help keep urea from building up in your blood. Depending on your stage of kidney disease, you may need to eat smaller portions of foods that are high in protein. Sources of animal protein include: Meat (all types). Fish and seafood. Poultry. Eggs. Dairy. Other protein foods include: Beans and legumes. Nuts and nut butter. Soy, like tofu.  Sodium Salt (sodium) helps to keep a healthy balance of fluids in your body. Too much salt can increase your blood pressure, which can harm your heart and lungs. Extra salt can also cause your body to keep too much fluid, making your kidneys work harder. You may need to limit or avoid foods that are high in salt, such as: Salt seasonings. Soy and teriyaki sauce. Meats that are: Packaged. Precooked. Cured. Processed. Salted crackers and snack foods. Fast food. Canned soups and foods. Pickled foods. Boxed mixes or ready-to-eat boxed meals and side dishes. Bottled dressings, sauces, and marinades. Talk with your dietitian about how much potassium, phosphorus, protein, and salt you may have  each day. What are tips for following this plan? Reading food labels  Check the amount of salt  in foods. Limit foods that have salt listed among the first five ingredients. Try to eat low-salt foods. Check the ingredient list for added phosphorus or potassium. Phos in an ingredient is a sign that phosphorus has been added. Do not buy foods that are calcium -enriched or that have calcium  added to them (fortified). Buy canned vegetables and beans that say no salt added. Rinse them before eating. Lifestyle Limit the amount of protein you eat from animal sources each day. Focus on protein from plant sources, like tofu and dried beans, peas, and lentils. Do not add salt to food when cooking or before eating. Do not eat star fruit. It can be toxic for people with kidney problems. Talk with your health care provider before taking any vitamin or mineral supplements. If told by your provider: Track how much liquid you have so you can avoid drinking too much. Try to eat foods that are made mostly from water, like gelatin, ice cream, soups, and juicy fruits and vegetables. If you have diabetes and chronic kidney disease: If you have diabetes and CKD, you need to keep your blood sugar (glucose) in the target range recommended by your provider. Follow your diabetes management plan. This may include: Checking your blood glucose regularly. Taking medicines by mouth, or taking insulin , or both. Exercising for at least 30 minutes on 5 or more days each week, or as told by your provider. Tracking how many servings of carbohydrates you eat at each meal. Not using orange juice to treat low blood sugars. Instead, use apple juice, cranberry juice, or clear soda. You may be given guidelines on what foods and nutrients you may eat, and how much you can have each day. This depends on your stage of kidney disease and whether you have high blood pressure. Follow the meal plan your dietitian gives you. Where to find more information [employer] of Diabetes and Digestive and Kidney Diseases:  StageSync.si National Kidney Foundation: kidney.org This information is not intended to replace advice given to you by your health care provider. Make sure you discuss any questions you have with your health care provider. Document Revised: 03/08/2023 Document Reviewed: 03/08/2023 Elsevier Patient Education  2024 Elsevier Inc.  Chronic Kidney Disease in Adults: What to Know Chronic kidney disease (CKD) is when lasting damage happens to the kidneys slowly over time. The kidneys are two organs that do many important things in the body. These include: Taking waste and extra fluid out of the blood to make pee (urine). Making hormones. Keeping the right amount of fluids and chemicals in the body. A small amount of kidney damage may not cause problems. You must take steps to help keep the kidney damage from getting worse. A lot of damage may cause kidney failure. Kidney failure means the kidneys can no longer work right. What are the causes? Diabetes. High blood pressure. Diseases that affect the heart and blood vessels. Other kidney diseases. Diseases that affect the body's defense system (immune system). A problem with the flow of pee. This may be caused by: Kidney stones. Cancer. An enlarged prostate, in males. A kidney infection or urinary tract infection (UTI) that keeps coming back. What increases the risk? Getting older. The chances of having CKD increase with age. A family history of kidney disease or kidney failure. Having a disease caused by genes. Taking medicines that can harm the kidneys. Being near  or having contact with harmful substances. Being very overweight. Using tobacco now or in the past. What are the signs or symptoms? Common symptoms of CKD include: Feeling very tired and having less energy. Swelling of the face, legs, ankles, or feet. Throwing up or feeling like you may throw up. Not wanting to eat as much as normal. Being confused or not able to  focus. Twitches and cramps in the leg muscles or other muscles. Dry, itchy skin. Other symptoms may include: Shortness of breath. Trouble sleeping. Making less pee, or making more pee, especially at night. A taste of metal in your mouth. You may also become anemic. Anemia means there's not enough red blood cells in your blood. You may get symptoms slowly. You may not notice them until the kidney damage gets very bad. How is this diagnosed? CKD may be diagnosed based on: Tests on your blood or pee. Imaging tests, like an ultrasound or a CT scan. A kidney biopsy. For this test, a sample of kidney tissue is removed to be looked at under a microscope. These tests will help to find out how serious the CKD is. How is this treated? Often, there's no cure for CKD. Treatment can help with symptoms and help keep the disease from getting worse. Treatment may include: Treating other problems that are causing your CKD or making it worse. Diet changes. You may need to: Avoid alcohol. Avoid foods that are high in salt, potassium, phosphorous, and protein. Taking medicines for symptoms and to help control other conditions. Dialysis. This treatment gets harmful waste out of your body. It may be needed if you have kidney failure. Follow these instructions at home: Medicines Take your medicines only as told. The amount of some medicines you take may need to be changed. Do not take any new medicines, vitamins, or supplements unless your health care provider says it's okay. These may make kidney damage worse. Lifestyle Do not smoke, vape, or use nicotine or tobacco. If you drink alcohol: Limit how much you have to: 0-1 drink a day if you're male. 0-2 drinks a day if you're male. Know how much alcohol is in your drink. In the [place] one drink is one 12 oz bottle of beer (355 mL), one 5 oz glass of wine (148 mL), or one 1 oz glass of hard liquor (44 mL). Stay at a healthy weight. If you need help, ask  your provider. General instructions  Eat and drink as told. Track your blood pressure at home. Tell your provider about any changes. If you have diabetes, track your blood sugar as told. Exercise at least 30 minutes a day, 5 days a week. Keep your shots (vaccinations) up to date. Keep all follow-up visits. Your provider may need to change your treatments over time. Where to find support American Kidney Fund: [place].com.au Kidney School: kidneyschool.org American Association of Kidney Patients: https://www.miller-montoya.com/ Where to find more information National Kidney Foundation: kidney.org Centers for Disease Control and Prevention. To learn more: Go to DiningCalendar.de. Click Search. Type chronic kidney disease in the search box. Contact a health care provider if: You have new symptoms. You get symptoms of end-stage kidney disease. These include: Headaches. Numbness in your hands or feet. Leg cramps. Easy bruising. Get help right away if: You have a fever. You make less pee than usual. You have pain or bleeding when you pee or poop. You have chest pain. You have shortness of breath. These symptoms may be an emergency. Call 911 right away.  Do not wait to see if the symptoms will go away. Do not drive yourself to the hospital. This information is not intended to replace advice given to you by your health care provider. Make sure you discuss any questions you have with your health care provider. Document Revised: 06/07/2023 Document Reviewed: 01/28/2023 Elsevier Patient Education  2024 Elsevier Inc.  Warning Signs of a Stroke A stroke is a Midwife. It should be treated right away. A stroke happens when there is not enough blood flow to the brain. A stroke can lead to brain damage and death. But if a person gets treated right away, they have a better chance of surviving and recovering. It is very important to recognize the symptoms of a stroke. What types of strokes are there? There are two  main types of strokes: Ischemic stroke. This is the most common type. It happens when a blood vessel that sends blood to the brain is blocked. Hemorrhagic stroke. This happens when there is bleeding in the brain. This may be from a blood vessel leaking or bursting. A transient ischemic attack (TIA) causes the same symptoms as a stroke. But the symptoms go away quickly and do not cause lasting damage to the brain. TIAs still need to be treated right away. They are also a sign that you are at higher risk for a stroke. What are the warning signs of a stroke? The symptoms of a stroke may differ based on the part of the brain that is involved. Symptoms often happen all of a sudden. BE FAST symptoms BE FAST is an easy way to remember the main warning signs of a stroke: B - Balance. Signs are dizziness, sudden trouble walking, or loss of balance. E - Eyes. Signs are trouble seeing or a sudden change in vision. F - Face. Signs are sudden weakness or numbness of the face, or the face or eyelid drooping on one side. A - Arms. Signs are weakness or numbness in an arm. This happens suddenly and usually on one side of the body. S - Speech. Signs are sudden trouble speaking, slurred speech, or trouble understanding what people say. T - Time. Time to call emergency services. Write down what time symptoms started. A stroke may be happening even if only one BE FAST symptom is present. Other signs of a stroke Some less common signs of a stroke include: A sudden, severe headache with no known cause. Nausea or vomiting. Seizure. These symptoms may be an emergency. Get help right away. Call 911. Do not wait to see if the symptoms will go away. Do not drive yourself to the hospital.  This information is not intended to replace advice given to you by your health care provider. Make sure you discuss any questions you have with your health care provider. Document Revised: 05/10/2022 Document Reviewed:  05/10/2022 Elsevier Patient Education  2024 [employer].

## 2024-03-23 ENCOUNTER — Telehealth: Payer: Self-pay

## 2024-03-23 MED ORDER — BLOOD PRESSURE MONITOR AUTOMAT DEVI: 1.0000 | Freq: Once | 0 refills | Status: AC

## 2024-03-23 NOTE — Telephone Encounter (Signed)
-----   Message from [facility] B sent at 03/23/2024  7:17 AM EDT -----  ----- Message ----- From: Devra Lands, RN Sent: 03/22/2024   5:22 PM EDT To: [facility]  Hello, Patient states unable to obtained BP cuff from insurance - can you please order large BP cuff for dx HTN CKD CHF? Please let me know if questions. Thank you.

## 2024-03-26 DIAGNOSIS — I872 Venous insufficiency (chronic) (peripheral): Secondary | ICD-10-CM | POA: Diagnosis not present

## 2024-03-26 DIAGNOSIS — I509 Heart failure, unspecified: Secondary | ICD-10-CM | POA: Diagnosis not present

## 2024-03-26 DIAGNOSIS — E1122 Type 2 diabetes mellitus with diabetic chronic kidney disease: Secondary | ICD-10-CM | POA: Diagnosis not present

## 2024-03-26 DIAGNOSIS — I130 Hypertensive heart and chronic kidney disease with heart failure and stage 1 through stage 4 chronic kidney disease, or unspecified chronic kidney disease: Secondary | ICD-10-CM | POA: Diagnosis not present

## 2024-03-26 DIAGNOSIS — L97828 Non-pressure chronic ulcer of other part of left lower leg with other specified severity: Secondary | ICD-10-CM | POA: Diagnosis not present

## 2024-03-27 DIAGNOSIS — E1122 Type 2 diabetes mellitus with diabetic chronic kidney disease: Secondary | ICD-10-CM | POA: Diagnosis not present

## 2024-03-27 DIAGNOSIS — N2581 Secondary hyperparathyroidism of renal origin: Secondary | ICD-10-CM | POA: Diagnosis not present

## 2024-03-27 DIAGNOSIS — D631 Anemia in chronic kidney disease: Secondary | ICD-10-CM | POA: Diagnosis not present

## 2024-03-27 DIAGNOSIS — I10 Essential (primary) hypertension: Secondary | ICD-10-CM | POA: Diagnosis not present

## 2024-03-27 DIAGNOSIS — I129 Hypertensive chronic kidney disease with stage 1 through stage 4 chronic kidney disease, or unspecified chronic kidney disease: Secondary | ICD-10-CM | POA: Diagnosis not present

## 2024-03-27 DIAGNOSIS — R809 Proteinuria, unspecified: Secondary | ICD-10-CM | POA: Diagnosis not present

## 2024-03-27 DIAGNOSIS — N2889 Other specified disorders of kidney and ureter: Secondary | ICD-10-CM | POA: Diagnosis not present

## 2024-03-27 DIAGNOSIS — N1832 Chronic kidney disease, stage 3b: Secondary | ICD-10-CM | POA: Diagnosis not present

## 2024-03-28 ENCOUNTER — Ambulatory Visit (INDEPENDENT_AMBULATORY_CARE_PROVIDER_SITE_OTHER): Admitting: Vascular Surgery

## 2024-03-29 ENCOUNTER — Encounter: Payer: Self-pay | Admitting: Oncology

## 2024-03-29 ENCOUNTER — Telehealth

## 2024-03-29 ENCOUNTER — Ambulatory Visit (INDEPENDENT_AMBULATORY_CARE_PROVIDER_SITE_OTHER): Admitting: Vascular Surgery

## 2024-03-29 ENCOUNTER — Encounter (INDEPENDENT_AMBULATORY_CARE_PROVIDER_SITE_OTHER): Payer: Self-pay | Admitting: Vascular Surgery

## 2024-03-29 VITALS — BP 141/87 | HR 60

## 2024-03-29 DIAGNOSIS — I10 Essential (primary) hypertension: Secondary | ICD-10-CM | POA: Diagnosis not present

## 2024-03-29 DIAGNOSIS — I872 Venous insufficiency (chronic) (peripheral): Secondary | ICD-10-CM

## 2024-03-29 DIAGNOSIS — E1121 Type 2 diabetes mellitus with diabetic nephropathy: Secondary | ICD-10-CM

## 2024-03-29 DIAGNOSIS — E7800 Pure hypercholesterolemia, unspecified: Secondary | ICD-10-CM

## 2024-03-29 NOTE — Patient Instructions (Signed)
 Daniel Kline - I am sorry I was unable to reach you today for our scheduled appointment. I work with [name], MD and am calling to support your healthcare needs. Please contact me at [phone] at your earliest convenience. I look forward to speaking with you soon.   Thank you,  [name], MSN, RN [facility]  [facility], [facility] RN Care Manager Direct Dial: [phone] Fax: [phone]

## 2024-03-29 NOTE — Progress Notes (Signed)
 Subjective:    Patient ID: Daniel Kline, male    DOB: 02/07/1946, 78 y.o.   MRN: [id number] Chief Complaint  Patient presents with   Follow-up    Daniel Kline is a 78 yo male who presents to clinic today for follow up of bilateral lower extremity Swelling.  Patient was placed in Unna boot wraps 5 weeks ago and he returns today for a follow-up evaluation.  Patient endorses he feels better in general and his legs are less swollen.  He has skin scaling with intermittent blisters and small scabs where he is scratching at his legs that are not currently healed..  Edema today he endorses is better post unna boot wrapping over the past 4 weeks. He endorses he is making progress but is slow.  Today he endorses that he started riding an exercise bike for exercise as he has other difficulties such as back pain which limits his ability to walk.  I am okay with that I told him to continue to exercise is much as he can at this time with this in the boots.    Review of Systems  Constitutional: Negative.   Cardiovascular:  Positive for leg swelling.  Musculoskeletal:  Positive for myalgias.  Skin:  Positive for color change and wound.       Patient remains with leg swelling bilaterally and Unna boot wraps.  He continues to have color changes purple and pink due to vascular insufficiency of the lower extremities.  Some of these areas I would consider to be wounds but are slowly healing.  All other systems reviewed and are negative.      Objective:   Physical Exam Vitals reviewed.  Constitutional:      Appearance: Normal appearance. He is obese.  HENT:     Head: Normocephalic.  Eyes:     Pupils: Pupils are equal, round, and reactive to light.  Cardiovascular:     Rate and Rhythm: Normal rate and regular rhythm.     Pulses: Normal pulses.     Heart sounds: Normal heart sounds.  Pulmonary:     Effort: Pulmonary effort is normal.     Breath sounds: Normal breath sounds.  Abdominal:      General: Bowel sounds are normal.     Palpations: Abdomen is soft.  Musculoskeletal:        General: Swelling and tenderness present.     Right lower leg: Edema present.     Left lower leg: Edema present.     Comments: Patient continues to have swelling to his bilateral lower extremities with Unna boots.  Also has discoloration purple making vascular insufficiency noticeable.  Skin:    General: Skin is warm and dry.     Capillary Refill: Capillary refill takes 2 to 3 seconds.     Comments: Patient noted to have multiple skin tears from prior scratching of the legs.  Unna boot wraps applied helping but not completely healed.  Neurological:     General: No focal deficit present.     Mental Status: He is alert and oriented to person, place, and time. Mental status is at baseline.  Psychiatric:        Mood and Affect: Mood normal.        Behavior: Behavior normal.        Thought Content: Thought content normal.        Judgment: Judgment normal.     BP (!) 141/87   Pulse 60   Past Medical  History:  Diagnosis Date   Anemia    H/O   Anxiety    Arthritis    Atrial flutter (HCC)    a. s/p post ablation in 04/2017   Chronic heart failure with preserved ejection fraction (HFpEF) (HCC)    a. 03/2022 Echo: EF 60-65%, no rwma, GrI DD, nl RV fxn; b. 02/2024 Echo: EF 60 to 65% with Gr1 DD, nl RV function, and mild MR.   CKD (chronic kidney disease), stage IV (HCC)    Complication of anesthesia    CTCL (cutaneous T-cell lymphoma) (HCC)    Diabetes mellitus without complication (HCC)    Dysplastic nevus 12/19/2017   Right distal lat. forearm near wrist. Severe atypia, close to peripheral margin.   Dysplastic nevus 06/21/2018   Upper back right paraspinal. Severe atypia, peripheral margin involved. Excised 07/11/2018, margins free.   Family history of adverse reaction to anesthesia    PT WAS ADOPTED   HLD (hyperlipidemia)    HTN (hypertension)    Hx of dysplastic nevus 2019   multiple sites    Hx of squamous cell carcinoma 01/18/2018   R mid lateral forearm   MRSA (methicillin resistant Staphylococcus aureus)    after back surgery   OSA (obstructive sleep apnea)    USES BIPAP   Polio    POLIOMYELITIS 01/12/2010   Right arm affected   PONV (postoperative nausea and vomiting)    Squamous cell carcinoma of skin 12/19/2017   Right mid lat. forearm. SCCis, hypertrophic.    Social History   Socioeconomic History   Marital status: Married    Spouse name: Not on file   Number of children: 0   Years of education: Not on file   Highest education level: Not on file  Occupational History   Occupation: Corporate investment banker    Comment: 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   Occupation: Engineering geologist    Comment: Retired   Occupation: Scientist, research (medical)    Comment: Retired  Tobacco Use   Smoking status: Former    Types: Cigars    Quit date: 09/23/1990    Years since quitting: 33.5    Passive exposure: Past (as a child)   Smokeless tobacco: Former    Quit date: 02/25/2006  Vaping Use   Vaping status: Never Used  Substance and Sexual Activity   Alcohol use: No    Alcohol/week: 0.0 standard drinks of alcohol   Drug use: No   Sexual activity: Yes    Partners: Female  Other Topics Concern   Not on file  Social History Narrative   Has living will   Wife is health care POA---then brother or sister   Would accept resuscitation attempts but no prolonged ventilation or tube feeds   Social Drivers of Health   Financial Resource Strain: Low Risk  (03/22/2024)   Overall Financial Resource Strain (CARDIA)    Difficulty of Paying Living Expenses: Not very hard  Food Insecurity: No Food Insecurity (03/22/2024)   Hunger Vital Sign    Worried About Running Out of Food in the Last Year: Never true    Ran Out of Food in the Last Year: Never true  Transportation Needs: No Transportation Needs (03/22/2024)   PRAPARE - Administrator, Civil Service (Medical):  No    Lack of Transportation (Non-Medical): No  Physical Activity: Inactive (03/22/2024)   Exercise Vital Sign    Days of Exercise per Week: 0 days    Minutes of Exercise per Session: 0 min  Stress: No Stress Concern Present (03/22/2024)   [employer] of Occupational Health - Occupational Stress Questionnaire    Feeling of Stress: Only a little  Social Connections: Socially Isolated (03/22/2024)   Social Connection and Isolation Panel    Frequency of Communication with Friends and Family: Never    Frequency of Social Gatherings with Friends and Family: Never    Attends Religious Services: Never    Database administrator or Organizations: No    Attends Banker Meetings: Never    Marital Status: Married  Catering manager Violence: Not At Risk (03/22/2024)   Humiliation, Afraid, Rape, and Kick questionnaire    Fear of Current or Ex-Partner: No    Emotionally Abused: No    Physically Abused: No    Sexually Abused: No    Past Surgical History:  Procedure Laterality Date   BACK SURGERY     LUMBAR   CARDIAC ELECTROPHYSIOLOGY STUDY AND ABLATION  2019   CATARACT EXTRACTION W/PHACO Right 05/05/2022   Procedure: CATARACT EXTRACTION PHACO AND INTRAOCULAR LENS PLACEMENT (IOC) RIGHT;  Surgeon: Mittie Gaskin, MD;  Location: [facility];  Service: Ophthalmology;  Laterality: Right;  Diabetic 10.42 01:13.4   COLONOSCOPY WITH PROPOFOL  N/A 04/04/2019   Procedure: COLONOSCOPY WITH PROPOFOL ;  Surgeon: Toledo, Ladell POUR, MD;  Location: [facility] END[facility]COPY;  Service: Gastroenterology;  Laterality: N/A;   ESOPHAGOGASTRODUODENOSCOPY N/A 11/28/2023   Procedure: EGD (ESOPHAGOGASTRODUODENOSCOPY);  Surgeon: Jinny Carmine, MD;  Location: [facility] END[facility];  Service: Endoscopy;  Laterality: N/A;   I & D EXTREMITY Right 02/01/2020   Procedure: IRRIGATION AND DEBRIDEMENT EXTREMITY with poly exchange;  Surgeon: Mardee Lynwood SQUIBB, MD;  Location: [facility] ORS;  Service: Orthopedics;  Laterality: Right;    INCISION AND DRAINAGE     BACK-MRSA INFECTION AFTER BACK SURGERY   KNEE ARTHROPLASTY Right 01/28/2020   Procedure: COMPUTER ASSISTED TOTAL KNEE ARTHROPLASTY;  Surgeon: Mardee Lynwood SQUIBB, MD;  Location: [facility] ORS;  Service: Orthopedics;  Laterality: Right;   MOUTH SURGERY     right elbow surgery     right knee surgery     right shoulder surgery     from polio damage   TONSILLECTOMY     TOTAL HIP ARTHROPLASTY Bilateral 04/2016    Family History  Adopted: Yes    Allergies  Allergen Reactions   Bee Venom Anaphylaxis   Beta Adrenergic Blockers Other (See Comments)    Symptomatic bradycardia   Oxycodone  Other (See Comments)    Delusions   Hydromorphone  Other (See Comments)    hallucinating   Hydroxychloroquine     Other reaction(s): Other (See Comments) He broke out really badly.   Zolpidem Other (See Comments)       Latest Ref Rng & Units 03/12/2024    2:27 PM 03/06/2024    1:42 PM 03/03/2024    8:18 AM  CBC  WBC 4.0 - 10.5 K/uL 6.3     Hemoglobin 13.0 - 17.0 g/dL 88.6  88.6  89.1   Hematocrit 39.0 - 52.0 % 34.7  34.0    Platelets 150.0 - 400.0 K/uL 178.0         CMP     Component Value Date/Time   NA 137 03/12/2024 1427   NA 141 10/28/2011 0325   K 4.6 03/12/2024 1427   K 3.9 10/28/2011 0325   CL 107 03/12/2024 1427   CL 106 10/28/2011 0325   CO2 24 03/12/2024 1427   CO2 25 10/28/2011 0325   GLUCOSE 122 (H) 03/12/2024 1427  GLUCOSE 121 (H) 10/28/2011 0325   BUN 45 (H) 03/12/2024 1427   BUN 22 (H) 10/28/2011 0325   CREATININE 3.30 (H) 03/12/2024 1427   CREATININE 3.87 (H) 03/06/2024 1342   CREATININE 1.27 (H) 02/09/2016 1421   CALCIUM  10.1 03/12/2024 1427   CALCIUM  7.9 (L) 10/28/2011 0325   PROT 6.5 02/28/2024 1820   ALBUMIN 3.9 03/12/2024 1427   AST 22 02/28/2024 1820   ALT 16 02/28/2024 1820   ALKPHOS 51 02/28/2024 1820   BILITOT 0.5 02/28/2024 1820   GFR 17.08 (L) 03/12/2024 1427   GFRNONAA 15 (L) 03/06/2024 1342   GFRNONAA 56 (L) 02/09/2016 1421      No results found.     Assessment & Plan:   1. Chronic venous insufficiency (Primary) No surgery or intervention at this point in time.     I have had a long discussion with the patient regarding venous insufficiency and why it  causes symptoms, specifically venous ulceration. I have discussed with the patient the chronic skin changes that accompany venous insufficiency and the long term sequela such as infection and recurring  ulceration.  Patient will be placed in [employer] which will be changed weekly drainage permitting.   In addition, behavioral modification including several periods of elevation of the lower extremities during the day will be continued. Achieving a position with the ankles at heart level was stressed to the patient   The patient is instructed to begin routine exercise, especially walking on a daily basis   In the future the patient can be assessed for graduated compression stockings or wraps as well as a Lymph Pump once the ulcers are healed.  Patient will continue to do another 4 weeks of Unna boot wraps and return to see me on week 5 for further evaluation.  2. Essential hypertension Continue antihypertensive medications as already ordered, these medications have been reviewed and there are no changes at this time.   3. Type 2 diabetes mellitus with diabetic nephropathy, without long-term current use of insulin  (HCC) Continue hypoglycemic medications as already ordered, these medications have been reviewed and there are no changes at this time.   Hgb A1C to be monitored as already arranged by primary service  4. Hypercholesterolemia Continue statin as ordered and reviewed, no changes at this time   5. Morbid obesity (HCC) Discussed with the patient the need to lose weight as this effects the swelling to his lower extremities as well as his overall health.    Current Outpatient Medications on File Prior to Visit  Medication Sig Dispense Refill    amLODipine  (NORVASC ) 10 MG tablet Take 10 mg by mouth daily.     calcitRIOL  (ROCALTROL ) 0.25 MCG capsule Take 0.5 mcg by mouth daily.     Cholecalciferol  (VITAMIN D -3) 25 MCG (1000 UT) CAPS Take 1,000 Units by mouth daily.     clorazepate  (TRANXENE ) 7.5 MG tablet TAKE 1 TABLET BY MOUTH TWICE A DAY AS NEEDED FOR ANXIETY 60 tablet 0   cyanocobalamin  (VITAMIN B12) 500 MCG tablet Take 1,000 mcg by mouth daily.     EPINEPHrine  0.3 mg/0.3 mL IJ SOAJ injection Inject 0.3 mg into the muscle as needed for anaphylaxis. 1 each 5   folic acid  (FOLVITE ) 1 MG tablet Take 1 mg by mouth daily.     furosemide  (LASIX ) 40 MG tablet Take 40 mg by mouth daily as needed (for weight gain of 3# or more).     gabapentin  (NEURONTIN ) 100 MG capsule Take 2 capsules (  200 mg total) by mouth 3 (three) times daily. 180 capsule 0   glucose blood (ONETOUCH VERIO) test strip Use to check blood sugar once daily 100 each 12   hydrocortisone  2.5 % cream Apply topically 3 (three) times daily as needed. 56 g 3   isosorbide  mononitrate (IMDUR ) 60 MG 24 hr tablet Take 1 tablet (60 mg total) by mouth daily before lunch. 90 tablet 3   latanoprost  (XALATAN ) 0.005 % ophthalmic solution Place 1 drop into both eyes at bedtime.     MAGNESIUM  PO Take 1 tablet by mouth daily.     melatonin 5 MG TABS Take 1 tablet (5 mg total) by mouth at bedtime as needed. 30 tablet 0   rosuvastatin  (CRESTOR ) 10 MG tablet TAKE ONE TABLET BY MOUTH EVERY DAY 90 tablet 3   Ferrous Sulfate  (IRON PO) Take 1 tablet by mouth every morning. (Patient not taking: Reported on 03/29/2024)     HYDROcodone -acetaminophen  (NORCO/VICODIN) 5-325 MG tablet TAKE ONE TABLET BY MOUTH EVERY 6 HOURS AS NEEDED FOR PAIN (Patient not taking: Reported on 03/29/2024) 60 tablet 0   No current facility-administered medications on file prior to visit.    There are no Patient Instructions on file for this visit. No follow-ups on file.   Gwendlyn JONELLE Shank, NP

## 2024-04-02 ENCOUNTER — Ambulatory Visit: Admitting: Internal Medicine

## 2024-04-02 ENCOUNTER — Encounter: Payer: Self-pay | Admitting: Internal Medicine

## 2024-04-02 VITALS — BP 130/80 | HR 60 | Temp 98.1°F | Ht 66.0 in | Wt 227.0 lb

## 2024-04-02 DIAGNOSIS — I5032 Chronic diastolic (congestive) heart failure: Secondary | ICD-10-CM

## 2024-04-02 DIAGNOSIS — E1121 Type 2 diabetes mellitus with diabetic nephropathy: Secondary | ICD-10-CM | POA: Diagnosis not present

## 2024-04-02 DIAGNOSIS — N184 Chronic kidney disease, stage 4 (severe): Secondary | ICD-10-CM | POA: Diagnosis not present

## 2024-04-02 DIAGNOSIS — G894 Chronic pain syndrome: Secondary | ICD-10-CM | POA: Diagnosis not present

## 2024-04-02 DIAGNOSIS — F39 Unspecified mood [affective] disorder: Secondary | ICD-10-CM

## 2024-04-02 NOTE — Assessment & Plan Note (Signed)
 Hallucinations and agitation better now---likely due to stopping the hydrocodone 

## 2024-04-02 NOTE — Assessment & Plan Note (Addendum)
 Lab Results  Component Value Date   HGBA1C 5.7 (H) 12/27/2023   Has had good control No meds

## 2024-04-02 NOTE — Progress Notes (Signed)
 Subjective:    Patient ID: Daniel Kline, male    DOB: November 28, 1945, 78 y.o.   MRN: [id number]  HPI Here for follow up of multiple medical issues  Had spoken to his dermatologist about my reluctance to use methotrexate  Gave Dr Christie name to her  Did give up his license Off the hydrocodone  now Mind seems clearer now---and no more hallucinations Pain is an issue--but is using tylenol  (helps a little)  Still with UNNA boots on legs Following with vascular surgeon No chest pain or SOB No dizziness or syncope  Sugar was 112 last check  Current Outpatient Medications on File Prior to Visit  Medication Sig Dispense Refill   amLODipine  (NORVASC ) 10 MG tablet Take 10 mg by mouth daily.     calcitRIOL  (ROCALTROL ) 0.25 MCG capsule Take 0.5 mcg by mouth daily.     Cholecalciferol  (VITAMIN D -3) 25 MCG (1000 UT) CAPS Take 1,000 Units by mouth daily.     clorazepate  (TRANXENE ) 7.5 MG tablet TAKE 1 TABLET BY MOUTH TWICE A DAY AS NEEDED FOR ANXIETY 60 tablet 0   cyanocobalamin  (VITAMIN B12) 500 MCG tablet Take 1,000 mcg by mouth daily.     EPINEPHrine  0.3 mg/0.3 mL IJ SOAJ injection Inject 0.3 mg into the muscle as needed for anaphylaxis. 1 each 5   Ferrous Sulfate  (IRON PO) Take 1 tablet by mouth every morning.     folic acid  (FOLVITE ) 1 MG tablet Take 1 mg by mouth daily.     furosemide  (LASIX ) 40 MG tablet Take 40 mg by mouth daily as needed (for weight gain of 3# or more).     gabapentin  (NEURONTIN ) 100 MG capsule Take 2 capsules (200 mg total) by mouth 3 (three) times daily. 180 capsule 0   glucose blood (ONETOUCH VERIO) test strip Use to check blood sugar once daily 100 each 12   HYDROcodone -acetaminophen  (NORCO/VICODIN) 5-325 MG tablet TAKE ONE TABLET BY MOUTH EVERY 6 HOURS AS NEEDED FOR PAIN 60 tablet 0   hydrocortisone  2.5 % cream Apply topically 3 (three) times daily as needed. 56 g 3   isosorbide  mononitrate (IMDUR ) 60 MG 24 hr tablet Take 1 tablet (60 mg total) by mouth daily  before lunch. 90 tablet 3   latanoprost  (XALATAN ) 0.005 % ophthalmic solution Place 1 drop into both eyes at bedtime.     MAGNESIUM  PO Take 1 tablet by mouth daily.     melatonin 5 MG TABS Take 1 tablet (5 mg total) by mouth at bedtime as needed. 30 tablet 0   rosuvastatin  (CRESTOR ) 10 MG tablet TAKE ONE TABLET BY MOUTH EVERY DAY 90 tablet 3   No current facility-administered medications on file prior to visit.    Allergies  Allergen Reactions   Bee Venom Anaphylaxis   Beta Adrenergic Blockers Other (See Comments)    Symptomatic bradycardia   Oxycodone  Other (See Comments)    Delusions   Hydromorphone  Other (See Comments)    hallucinating   Hydroxychloroquine     Other reaction(s): Other (See Comments) He broke out really badly.   Zolpidem Other (See Comments)    Past Medical History:  Diagnosis Date   Anemia    H/O   Anxiety    Arthritis    Atrial flutter (HCC)    a. s/p post ablation in 04/2017   Chronic heart failure with preserved ejection fraction (HFpEF) (HCC)    a. 03/2022 Echo: EF 60-65%, no rwma, GrI DD, nl RV fxn; b. 02/2024 Echo: EF 60  to 65% with Gr1 DD, nl RV function, and mild MR.   CKD (chronic kidney disease), stage IV (HCC)    Complication of anesthesia    CTCL (cutaneous T-cell lymphoma) (HCC)    Diabetes mellitus without complication (HCC)    Dysplastic nevus 12/19/2017   Right distal lat. forearm near wrist. Severe atypia, close to peripheral margin.   Dysplastic nevus 06/21/2018   Upper back right paraspinal. Severe atypia, peripheral margin involved. Excised 07/11/2018, margins free.   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 forearm. SCCis, hypertrophic.    Past Surgical History:  Procedure Laterality Date   BACK SURGERY     LUMBAR   CARDIAC ELECTROPHYSIOLOGY STUDY AND ABLATION  2019   CATARACT EXTRACTION W/PHACO Right 05/05/2022   Procedure: CATARACT EXTRACTION PHACO AND INTRAOCULAR LENS PLACEMENT (IOC) RIGHT;  Surgeon: Mittie Gaskin, MD;  Location: [facility];  Service: Ophthalmology;  Laterality: Right;  Diabetic 10.42 01:13.4   COLONOSCOPY WITH PROPOFOL  N/A 04/04/2019   Procedure: COLONOSCOPY WITH PROPOFOL ;  Surgeon: Toledo, Ladell POUR, MD;  Location: [facility] END[facility]COPY;  Service: Gastroenterology;  Laterality: N/A;   ESOPHAGOGASTRODUODENOSCOPY N/A 11/28/2023   Procedure: EGD (ESOPHAGOGASTRODUODENOSCOPY);  Surgeon: Jinny Carmine, MD;  Location: [facility] END[facility];  Service: Endoscopy;  Laterality: N/A;   I & D EXTREMITY Right 02/01/2020   Procedure: IRRIGATION AND DEBRIDEMENT EXTREMITY with poly exchange;  Surgeon: Mardee Lynwood SQUIBB, MD;  Location: [facility] ORS;  Service: Orthopedics;  Laterality: Right;   INCISION AND DRAINAGE     BACK-MRSA INFECTION AFTER BACK SURGERY   KNEE ARTHROPLASTY Right 01/28/2020   Procedure: COMPUTER ASSISTED TOTAL KNEE ARTHROPLASTY;  Surgeon: Mardee Lynwood SQUIBB, MD;  Location: [facility] ORS;  Service: Orthopedics;  Laterality: Right;   MOUTH SURGERY     right elbow surgery     right knee surgery     right shoulder surgery     from polio damage   TONSILLECTOMY     TOTAL HIP ARTHROPLASTY Bilateral 04/2016    Family History  Adopted: Yes    Social History   Socioeconomic History   Marital status: Married    Spouse name: Not on file   Number of children: 0   Years of education: Not on file   Highest education level: Not on file  Occupational History   Occupation: Corporate investment banker    Comment: 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   Occupation:  Engineering geologist    Comment: Retired   Occupation: Scientist, research (medical)    Comment: Retired  Tobacco Use   Smoking status: Former    Types: Cigars    Quit date: 09/23/1990    Years since quitting: 33.5    Passive exposure: Past (as a child)   Smokeless tobacco: Former    Quit date: 02/25/2006  Vaping Use   Vaping status: Never Used  Substance and Sexual Activity   Alcohol use: No    Alcohol/week: 0.0 standard drinks of alcohol  Drug use: No   Sexual activity: Yes    Partners: Female  Other Topics Concern   Not on file  Social History Narrative   Has living will   Wife is health care POA---then brother or sister   Would accept resuscitation attempts but no prolonged ventilation or tube feeds   Social Drivers of Health   Financial Resource Strain: Low Risk  (03/22/2024)   Overall Financial Resource Strain (CARDIA)    Difficulty of Paying Living Expenses: Not very hard  Food Insecurity: No Food Insecurity (03/22/2024)   Hunger Vital Sign    Worried About Running Out of Food in the Last Year: Never true    Ran Out of Food in the Last Year: Never true  Transportation Needs: No Transportation Needs (03/22/2024)   PRAPARE - Administrator, Civil Service (Medical): No    Lack of Transportation (Non-Medical): No  Physical Activity: Inactive (03/22/2024)   Exercise Vital Sign    Days of Exercise per Week: 0 days    Minutes of Exercise per Session: 0 min  Stress: No Stress Concern Present (03/22/2024)   [employer] of Occupational Health - Occupational Stress Questionnaire    Feeling of Stress: Only a little  Social Connections: Socially Isolated (03/22/2024)   Social Connection and Isolation Panel    Frequency of Communication with Friends and Family: Never    Frequency of Social Gatherings with Friends and Family: Never    Attends Religious Services: Never    Database administrator or Organizations: No    Attends Tax inspector Meetings: Never    Marital Status: Married  Catering manager Violence: Not At Risk (03/22/2024)   Humiliation, Afraid, Rape, and Kick questionnaire    Fear of Current or Ex-Partner: No    Emotionally Abused: No    Physically Abused: No    Sexually Abused: No   Review of Systems Fingers feel numb at times Eating okay--weight is fairly stable Sleeps okay---some trouble initiating (sleeps in recliner) Doing well with his wife now    Objective:   Physical Exam Constitutional:      Appearance: Normal appearance.  Cardiovascular:     Rate and Rhythm: Normal rate and regular rhythm.     Heart sounds:     No gallop.     Comments: ?very soft systolic murmur Pulmonary:     Effort: Pulmonary effort is normal.     Breath sounds: Normal breath sounds. No wheezing or rales.  Abdominal:     Palpations: Abdomen is soft.     Tenderness: There is no abdominal tenderness.  Musculoskeletal:     Cervical back: Neck supple.     Comments: No sig edema with UNNA boots on  Lymphadenopathy:     Cervical: No cervical adenopathy.  Neurological:     Mental Status: He is alert.  Psychiatric:        Mood and Affect: Mood normal.        Behavior: Behavior normal.            Assessment & Plan:

## 2024-04-02 NOTE — Assessment & Plan Note (Signed)
 Has done okay off hydrocodone   Tylenol  now

## 2024-04-02 NOTE — Assessment & Plan Note (Signed)
Continues with Dr Wynelle LinkKolluru

## 2024-04-02 NOTE — Assessment & Plan Note (Signed)
 Compensated Has furosemide  for prn use---hasn't needed with the UNNA boots Off ARB now Isosorbide  60 daily

## 2024-04-03 ENCOUNTER — Inpatient Hospital Stay (HOSPITAL_BASED_OUTPATIENT_CLINIC_OR_DEPARTMENT_OTHER): Admitting: Oncology

## 2024-04-03 ENCOUNTER — Inpatient Hospital Stay: Payer: Self-pay | Attending: Oncology

## 2024-04-03 ENCOUNTER — Inpatient Hospital Stay: Attending: Oncology

## 2024-04-03 ENCOUNTER — Encounter: Payer: Self-pay | Admitting: Oncology

## 2024-04-03 VITALS — BP 126/69 | HR 58 | Temp 97.6°F | Resp 17 | Wt 225.0 lb

## 2024-04-03 DIAGNOSIS — E1122 Type 2 diabetes mellitus with diabetic chronic kidney disease: Secondary | ICD-10-CM

## 2024-04-03 DIAGNOSIS — Z8616 Personal history of COVID-19: Secondary | ICD-10-CM | POA: Insufficient documentation

## 2024-04-03 DIAGNOSIS — I129 Hypertensive chronic kidney disease with stage 1 through stage 4 chronic kidney disease, or unspecified chronic kidney disease: Secondary | ICD-10-CM | POA: Insufficient documentation

## 2024-04-03 DIAGNOSIS — D631 Anemia in chronic kidney disease: Secondary | ICD-10-CM

## 2024-04-03 DIAGNOSIS — Z87891 Personal history of nicotine dependence: Secondary | ICD-10-CM | POA: Diagnosis not present

## 2024-04-03 DIAGNOSIS — N184 Chronic kidney disease, stage 4 (severe): Secondary | ICD-10-CM

## 2024-04-03 DIAGNOSIS — D638 Anemia in other chronic diseases classified elsewhere: Secondary | ICD-10-CM

## 2024-04-03 LAB — IRON AND TIBC
Iron: 52 ug/dL (ref 45–182)
Saturation Ratios: 19 % (ref 17.9–39.5)
TIBC: 273 ug/dL (ref 250–450)
UIBC: 221 ug/dL

## 2024-04-03 LAB — CBC (CANCER CENTER ONLY)
HCT: 34.1 % — ABNORMAL LOW (ref 39.0–52.0)
Hemoglobin: 11.1 g/dL — ABNORMAL LOW (ref 13.0–17.0)
MCH: 29.1 pg (ref 26.0–34.0)
MCHC: 32.6 g/dL (ref 30.0–36.0)
MCV: 89.5 fL (ref 80.0–100.0)
Platelet Count: 167 K/uL (ref 150–400)
RBC: 3.81 MIL/uL — ABNORMAL LOW (ref 4.22–5.81)
RDW: 13.6 % (ref 11.5–15.5)
WBC Count: 5.6 K/uL (ref 4.0–10.5)
nRBC: 0 % (ref 0.0–0.2)

## 2024-04-03 LAB — FERRITIN: Ferritin: 86 ng/mL (ref 24–336)

## 2024-04-03 NOTE — Progress Notes (Signed)
 Hgb 11.1 today, no injection given

## 2024-04-03 NOTE — Progress Notes (Signed)
 Hematology/Oncology Consult note [facility]  [phone] [phone]  Patient Care Team: [name], MD as PCP - General (Internal Medicine) Perla Evalene PARAS, MD as PCP - Cardiology (Cardiology) Fate Morna SAILOR, [facility] (Inactive) as Pharmacist (Pharmacist) Melanee Annah BROCKS, MD as Consulting Physician (Oncology) Devra Lands, RN as [facility] Care Management   Name of the patient: Daniel Kline  [id number]  Mar 17, 1946   Date of visit: 04/03/24  Diagnosis-anemia of chronic kidney disease  Chief complaint/ Reason for visit-routine follow-up of anemia of chronic kidney disease  Heme/Onc history: Patient is a 78 year old male with a past medical history significant for hypertension hyperlipidemia type 2 diabetes CKD who was last seen by me in June 2023 while he was in the hospital with COVID and I was consulted for neutropenia.  Patient was subsequently admitted to the hospital in April 2025 for symptoms of fatigue and fall and was found to have significant pancytopenia with a hemoglobin of 6.6, platelets of 5 as well as neutropenia.  He was on methotrexate  weekly which she continued in the setting of worsening renal dysfunction and therefore methotrexate  was stopped to see if his counts recover.  Upon discharge from the hospital patient was noted to have white count of 6.3, H&H of 8.7/27 with a platelet count of 154.    Interval history-patient is overall doing better and remains off methotrexate .  He remains independent of his ADLs and occasionally uses a cane to ambulate.  ECOG PS- 2 Pain scale- 0   Review of systems- Review of Systems  Constitutional:  Positive for malaise/fatigue. Negative for chills, fever and weight loss.  HENT:  Negative for congestion, ear discharge and nosebleeds.   Eyes:  Negative for blurred vision.  Respiratory:  Negative for cough, hemoptysis, sputum production, shortness of breath and wheezing.    Cardiovascular:  Negative for chest pain, palpitations, orthopnea and claudication.  Gastrointestinal:  Negative for abdominal pain, blood in stool, constipation, diarrhea, heartburn, melena, nausea and vomiting.  Genitourinary:  Negative for dysuria, flank pain, frequency, hematuria and urgency.  Musculoskeletal:  Negative for back pain, joint pain and myalgias.  Skin:  Negative for rash.  Neurological:  Negative for dizziness, tingling, focal weakness, seizures, weakness and headaches.  Endo/Heme/Allergies:  Does not bruise/bleed easily.  Psychiatric/Behavioral:  Negative for depression and suicidal ideas. The patient does not have insomnia.       Allergies  Allergen Reactions   Bee Venom Anaphylaxis   Beta Adrenergic Blockers Other (See Comments)    Symptomatic bradycardia   Oxycodone  Other (See Comments)    Delusions   Hydromorphone  Other (See Comments)    hallucinating   Hydroxychloroquine     Other reaction(s): Other (See Comments) He broke out really badly.   Zolpidem Other (See Comments)     Past Medical History:  Diagnosis Date   Anemia    H/O   Anxiety    Arthritis    Atrial flutter (HCC)    a. s/p post ablation in 04/2017   Chronic heart failure with preserved ejection fraction (HFpEF) (HCC)    a. 03/2022 Echo: EF 60-65%, no rwma, GrI DD, nl RV fxn; b. 02/2024 Echo: EF 60 to 65% with Gr1 DD, nl RV function, and mild MR.   CKD (chronic kidney disease), stage IV (HCC)    Complication of anesthesia    CTCL (cutaneous T-cell lymphoma) (HCC)    Diabetes mellitus without complication (HCC)    Dysplastic nevus 12/19/2017  Right distal lat. forearm near wrist. Severe atypia, close to peripheral margin.   Dysplastic nevus 06/21/2018   Upper back right paraspinal. Severe atypia, peripheral margin involved. Excised 07/11/2018, margins free.   Family history of adverse reaction to anesthesia    PT WAS ADOPTED   HLD (hyperlipidemia)    HTN (hypertension)    Hx of  dysplastic nevus 2019   multiple sites   Hx of squamous cell carcinoma 01/18/2018   R mid lateral forearm   MRSA (methicillin resistant Staphylococcus aureus)    after back surgery   OSA (obstructive sleep apnea)    USES BIPAP   Polio    POLIOMYELITIS 01/12/2010   Right arm affected   PONV (postoperative nausea and vomiting)    Squamous cell carcinoma of skin 12/19/2017   Right mid lat. forearm. SCCis, hypertrophic.     Past Surgical History:  Procedure Laterality Date   BACK SURGERY     LUMBAR   CARDIAC ELECTROPHYSIOLOGY STUDY AND ABLATION  2019   CATARACT EXTRACTION W/PHACO Right 05/05/2022   Procedure: CATARACT EXTRACTION PHACO AND INTRAOCULAR LENS PLACEMENT (IOC) RIGHT;  Surgeon: Mittie Gaskin, MD;  Location: [facility];  Service: Ophthalmology;  Laterality: Right;  Diabetic 10.42 01:13.4   COLONOSCOPY WITH PROPOFOL  N/A 04/04/2019   Procedure: COLONOSCOPY WITH PROPOFOL ;  Surgeon: Toledo, Ladell POUR, MD;  Location: [facility] ENDOSCOPY;  Service: Gastroenterology;  Laterality: N/A;   ESOPHAGOGASTRODUODENOSCOPY N/A 11/28/2023   Procedure: EGD (ESOPHAGOGASTRODUODENOSCOPY);  Surgeon: Jinny Carmine, MD;  Location: [facility] ENDOSCOPY;  Service: Endoscopy;  Laterality: N/A;   I & D EXTREMITY Right 02/01/2020   Procedure: IRRIGATION AND DEBRIDEMENT EXTREMITY with poly exchange;  Surgeon: Mardee Lynwood SQUIBB, MD;  Location: [facility] ORS;  Service: Orthopedics;  Laterality: Right;   INCISION AND DRAINAGE     BACK-MRSA INFECTION AFTER BACK SURGERY   KNEE ARTHROPLASTY Right 01/28/2020   Procedure: COMPUTER ASSISTED TOTAL KNEE ARTHROPLASTY;  Surgeon: Mardee Lynwood SQUIBB, MD;  Location: [facility] ORS;  Service: Orthopedics;  Laterality: Right;   MOUTH SURGERY     right elbow surgery     right knee surgery     right shoulder surgery     from polio damage   TONSILLECTOMY     TOTAL HIP ARTHROPLASTY Bilateral 04/2016    Social History   Socioeconomic History   Marital status: Married    Spouse name:  Not on file   Number of children: 0   Years of education: Not on file   Highest education level: Not on file  Occupational History   Occupation: Corporate investment banker    Comment: 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   Occupation: Engineering geologist    Comment: Retired   Occupation: Scientist, research (medical)    Comment: Retired  Tobacco Use   Smoking status: Former    Types: Cigars    Quit date: 09/23/1990    Years since quitting: 33.5    Passive exposure: Past (as a child)   Smokeless tobacco: Former    Quit date: 02/25/2006  Vaping Use   Vaping status: Never Used  Substance and Sexual Activity   Alcohol use: No    Alcohol/week: 0.0 standard drinks of alcohol   Drug use: No   Sexual activity: Yes    Partners: Female  Other Topics Concern   Not on file  Social History Narrative   Has living will   Wife is health care POA---then brother or sister   Would accept resuscitation attempts but no prolonged ventilation or tube feeds  Social Drivers of Corporate investment banker Strain: Low Risk  (03/22/2024)   Overall Financial Resource Strain (CARDIA)    Difficulty of Paying Living Expenses: Not very hard  Food Insecurity: No Food Insecurity (03/22/2024)   Hunger Vital Sign    Worried About Running Out of Food in the Last Year: Never true    Ran Out of Food in the Last Year: Never true  Transportation Needs: No Transportation Needs (03/22/2024)   PRAPARE - Administrator, Civil Service (Medical): No    Lack of Transportation (Non-Medical): No  Physical Activity: Inactive (03/22/2024)   Exercise Vital Sign    Days of Exercise per Week: 0 days    Minutes of Exercise per Session: 0 min  Stress: No Stress Concern Present (03/22/2024)   [employer] of Occupational Health - Occupational Stress Questionnaire    Feeling of Stress: Only a little  Social Connections: Socially Isolated (03/22/2024)   Social Connection and Isolation Panel    Frequency of Communication  with Friends and Family: Never    Frequency of Social Gatherings with Friends and Family: Never    Attends Religious Services: Never    Database administrator or Organizations: No    Attends Banker Meetings: Never    Marital Status: Married  Catering manager Violence: Not At Risk (03/22/2024)   Humiliation, Afraid, Rape, and Kick questionnaire    Fear of Current or Ex-Partner: No    Emotionally Abused: No    Physically Abused: No    Sexually Abused: No    Family History  Adopted: Yes     Current Outpatient Medications:    amLODipine  (NORVASC ) 10 MG tablet, Take 10 mg by mouth daily., Disp: , Rfl:    calcitRIOL  (ROCALTROL ) 0.25 MCG capsule, Take 0.5 mcg by mouth daily., Disp: , Rfl:    calcium  carbonate (OSCAL) 1500 (600 Ca) MG TABS tablet, Take by mouth 2 (two) times daily with a meal., Disp: , Rfl:    Cholecalciferol  (VITAMIN D -3) 25 MCG (1000 UT) CAPS, Take 1,000 Units by mouth daily., Disp: , Rfl:    clorazepate  (TRANXENE ) 7.5 MG tablet, TAKE 1 TABLET BY MOUTH TWICE A DAY AS NEEDED FOR ANXIETY, Disp: 60 tablet, Rfl: 0   cyanocobalamin  (VITAMIN B12) 500 MCG tablet, Take 1,000 mcg by mouth daily., Disp: , Rfl:    EPINEPHrine  0.3 mg/0.3 mL IJ SOAJ injection, Inject 0.3 mg into the muscle as needed for anaphylaxis., Disp: 1 each, Rfl: 5   Ferrous Sulfate  (IRON PO), Take 1 tablet by mouth every morning., Disp: , Rfl:    folic acid  (FOLVITE ) 1 MG tablet, Take 1 mg by mouth daily., Disp: , Rfl:    furosemide  (LASIX ) 40 MG tablet, Take 40 mg by mouth daily as needed (for weight gain of 3# or more)., Disp: , Rfl:    gabapentin  (NEURONTIN ) 100 MG capsule, Take 2 capsules (200 mg total) by mouth 3 (three) times daily., Disp: 180 capsule, Rfl: 0   glucose blood (ONETOUCH VERIO) test strip, Use to check blood sugar once daily, Disp: 100 each, Rfl: 12   HYDROcodone -acetaminophen  (NORCO/VICODIN) 5-325 MG tablet, TAKE ONE TABLET BY MOUTH EVERY 6 HOURS AS NEEDED FOR PAIN, Disp: 60  tablet, Rfl: 0   hydrocortisone  2.5 % cream, Apply topically 3 (three) times daily as needed., Disp: 56 g, Rfl: 3   isosorbide  mononitrate (IMDUR ) 60 MG 24 hr tablet, Take 1 tablet (60 mg total) by mouth daily before lunch., Disp:  90 tablet, Rfl: 3   latanoprost  (XALATAN ) 0.005 % ophthalmic solution, Place 1 drop into both eyes at bedtime., Disp: , Rfl:    MAGNESIUM  PO, Take 1 tablet by mouth daily., Disp: , Rfl:    melatonin 5 MG TABS, Take 1 tablet (5 mg total) by mouth at bedtime as needed., Disp: 30 tablet, Rfl: 0   rosuvastatin  (CRESTOR ) 10 MG tablet, TAKE ONE TABLET BY MOUTH EVERY DAY, Disp: 90 tablet, Rfl: 3  Physical exam:  Vitals:   04/03/24 1336  BP: 126/69  Pulse: (!) 58  Resp: 17  Temp: 97.6 F (36.4 C)  SpO2: 94%  Weight: 225 lb (102.1 kg)   Physical Exam Constitutional:      Comments: Sitting in a wheelchair.  Appears in no acute distress  Cardiovascular:     Rate and Rhythm: Normal rate and regular rhythm.     Heart sounds: Normal heart sounds.  Pulmonary:     Effort: Pulmonary effort is normal.     Breath sounds: Normal breath sounds.  Abdominal:     General: Bowel sounds are normal.     Palpations: Abdomen is soft.  Skin:    General: Skin is warm and dry.  Neurological:     Mental Status: He is alert and oriented to person, place, and time.      I have personally reviewed labs listed below:    Latest Ref Rng & Units 03/12/2024    2:27 PM  CMP  Glucose 70 - 99 mg/dL 877   BUN 6 - 23 mg/dL 45   Creatinine 9.59 - 1.50 mg/dL 6.69   Sodium 864 - 854 mEq/L 137   Potassium 3.5 - 5.1 mEq/L 4.6   Chloride 96 - 112 mEq/L 107   CO2 19 - 32 mEq/L 24   Calcium  8.4 - 10.5 mg/dL 89.8       Latest Ref Rng & Units 04/03/2024    1:15 PM  CBC  WBC 4.0 - 10.5 K/uL 5.6   Hemoglobin 13.0 - 17.0 g/dL 88.8   Hematocrit 60.9 - 52.0 % 34.1   Platelets 150 - 400 K/uL 167      Assessment and plan- Patient is a 78 y.o. male here for routine follow-up of anemia of  chronic kidney disease  Patient's hemoglobin Looks remarkably improved presently at 11.1 as compared to May 2025 when he had significant pancytopenia from methotrexate .  He does not require any EPO at this time for anemia of chronic kidney disease.  Also his ferritin levels are close 286 with an iron saturation of 19%.  I am holding off any IV iron at this time.  CBC ferritin and iron studies in 3 and 6 months and I will see him back in 6 months   Visit Diagnosis 1. Anemia of chronic kidney failure, stage 4 (severe) (HCC)      Dr. Annah Skene, MD, MPH [facility] [phone] 04/03/2024 4:32 PM

## 2024-04-04 ENCOUNTER — Other Ambulatory Visit (INDEPENDENT_AMBULATORY_CARE_PROVIDER_SITE_OTHER): Payer: Self-pay | Admitting: Nurse Practitioner

## 2024-04-04 ENCOUNTER — Telehealth (INDEPENDENT_AMBULATORY_CARE_PROVIDER_SITE_OTHER): Payer: Self-pay

## 2024-04-04 DIAGNOSIS — L97909 Non-pressure chronic ulcer of unspecified part of unspecified lower leg with unspecified severity: Secondary | ICD-10-CM

## 2024-04-04 NOTE — Telephone Encounter (Signed)
 Copied from CRM [id number]. Topic: General - Call Back - No Documentation >> Apr 03, 2024  4:26 PM Harlene ORN wrote: Reason for CRM: Wife called to speak to Hadassah (Child psychotherapist) Wants her to call the patient on 09/05.

## 2024-04-04 NOTE — Telephone Encounter (Signed)
 Referral for home health skilled nursing for weekly unna wraps has been sent to [facility]. At this time [facility] will be able to accept referral. Patient and his spouse has been notified to be expecting a call from [facility].

## 2024-04-05 DIAGNOSIS — H401131 Primary open-angle glaucoma, bilateral, mild stage: Secondary | ICD-10-CM | POA: Diagnosis not present

## 2024-04-11 ENCOUNTER — Telehealth: Payer: Self-pay

## 2024-04-11 NOTE — Telephone Encounter (Signed)
 Copied from CRM [id number]. Topic: Clinical - Home Health Verbal Orders >> Apr 11, 2024  8:53 AM Aleatha BROCKS wrote: Caller/Agency: [facility]  Callback Number: [phone] Service Requested: Physical Therapy Frequency: 1 week 8  Any new concerns about the patient? No

## 2024-04-12 NOTE — Telephone Encounter (Signed)
 Verbal orders left on Angie's verified vm

## 2024-04-13 ENCOUNTER — Other Ambulatory Visit: Payer: Self-pay

## 2024-04-13 NOTE — Patient Outreach (Signed)
 Complex Care Management   Visit Note  04/13/2024  Name:  Daniel Kline MRN: [id number] DOB: 05-29-1946  Situation: Referral received for Complex Care Management related to Heart Failure, Chronic Kidney Disease, and Falls I obtained verbal consent from Patient.  Visit completed with Patient  on the phone  Background:   Past Medical History:  Diagnosis Date   Anemia    H/O   Anxiety    Arthritis    Atrial flutter (HCC)    a. s/p post ablation in 04/2017   Chronic heart failure with preserved ejection fraction (HFpEF) (HCC)    a. 03/2022 Echo: EF 60-65%, no rwma, GrI DD, nl RV fxn; b. 02/2024 Echo: EF 60 to 65% with Gr1 DD, nl RV function, and mild MR.   CKD (chronic kidney disease), stage IV (HCC)    Complication of anesthesia    CTCL (cutaneous T-cell lymphoma) (HCC)    Diabetes mellitus without complication (HCC)    Dysplastic nevus 12/19/2017   Right distal lat. forearm near wrist. Severe atypia, close to peripheral margin.   Dysplastic nevus 06/21/2018   Upper back right paraspinal. Severe atypia, peripheral margin involved. Excised 07/11/2018, margins free.   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 forearm. SCCis, hypertrophic.    Assessment: Patient Reported Symptoms:  Cognitive Cognitive Status: No symptoms reported Cognitive/Intellectual Conditions Management [RPT]: None reported or documented in medical history or problem list   Health Maintenance Behaviors: Annual physical exam Healing Pattern:  Slow Health Facilitated by: Rest  Neurological Neurological Review of Symptoms: Numbness Neurological Management Strategies: Medication therapy, Routine screening Neurological Comment: hands numb at times  HEENT HEENT Symptoms Reported: No symptoms reported      Cardiovascular Cardiovascular Symptoms Reported: Swelling in legs or feet Does patient have uncontrolled Hypertension?: No Cardiovascular Management Strategies: Routine screening Weight: 226 lb (102.5 kg) Cardiovascular Comment: lasix  prn, not needed often, weight stzable aware of sx CHF to call provider and red flags for ED  Respiratory Respiratory Symptoms Reported: Shortness of breath Other Respiratory Symptoms: DOE - improving riding stationary bike Respiratory Management Strategies: Exercise, Routine screening  Endocrine Endocrine Symptoms Reported: No symptoms reported Is patient diabetic?: Yes Is patient checking blood sugars at home?: Yes List most recent blood sugar readings, include date and time of day: FBG 123 no highs/lows, ranging 110-140    Gastrointestinal Gastrointestinal Symptoms Reported: No symptoms reported      Genitourinary Genitourinary Symptoms Reported: No symptoms reported    Integumentary Integumentary Symptoms Reported: Skin changes, Wound Additional Integumentary Details: bilateral unna boots with improvement changed weekly by [facility],  blisters intact on feet - getting inserts for shoes Skin Management Strategies: Medication therapy, Dressing changes, Routine screening  Musculoskeletal Musculoskelatal Symptoms Reviewed: Limited mobility Additional Musculoskeletal Details: cane prn exercising, feeling stroger and clearer after stoppong Norco Musculoskeletal Management Strategies: Routine screening, Medical device Musculoskeletal Comment: PT Falls in the past year?: No Number of  falls in past year: 1 or less Patient at Risk for Falls Due to: Impaired balance/gait Fall risk Follow up:  Falls prevention discussed  Psychosocial Psychosocial Symptoms Reported: No symptoms reported Behavioral Health Comment: concern about wife needing surgery Major Change/Loss/Stressor/Fears (CP): Death of a loved one, Medical condition, self, Medical condition, family Techniques to [place] with Loss/Stress/Change: Diversional activities Quality of Family Relationships: stressful Do you feel physically threatened by others?: No    04/13/2024    PHQ2-9 Depression Screening   Little interest or pleasure in doing things Not at all  Feeling down, depressed, or hopeless Not at all  PHQ-2 - Total Score 0  Trouble falling or staying asleep, or sleeping too much    Feeling tired or having little energy    Poor appetite or overeating     Feeling bad about yourself - or that you are a failure or have let yourself or your family down    Trouble concentrating on things, such as reading the newspaper or watching television    Moving or speaking so slowly that other people could have noticed.  Or the opposite - being so fidgety or restless that you have been moving around a lot more than usual    Thoughts that you would be better off dead, or hurting yourself in some way    PHQ2-9 Total Score    If you checked off any problems, how difficult have these problems made it for you to do your work, take care of things at home, or get along with other people    Depression Interventions/Treatment      There were no vitals filed for this visit.  Medications Reviewed Today     Reviewed by Devra Lands, RN (Registered Nurse) on 04/13/24 at 1551  Med List Status: <None>   Medication Order Taking? Sig Documenting Provider Last Dose Status Informant  amLODipine  (NORVASC ) 10 MG tablet [id number] Yes Take 10 mg by mouth daily. [provider]  Active Self  calcitRIOL  (ROCALTROL ) 0.25 MCG capsule [id number] Yes Take 0.5 mcg by mouth daily. [provider]  Active Self  calcium  carbonate (OSCAL)  1500 (600 Ca) MG TABS tablet [id number] Yes Take by mouth 2 (two) times daily with a meal. [provider]  Active   Cholecalciferol  (VITAMIN D -3) 25 MCG (1000 UT) CAPS [id number] Yes Take 1,000 Units by mouth daily. [provider]  Active Self  clorazepate  (TRANXENE ) 7.5 MG tablet [id number] Yes TAKE 1 TABLET BY MOUTH TWICE A DAY AS NEEDED FOR ANXIETY [name], MD  Active Self           Med Note LESLY, RICHERD CINDERELLA Schaumann Jan 05, 2024  9:20 AM) Take only as needed  cyanocobalamin  (VITAMIN B12) 500 MCG tablet [id number] Yes Take 1,000 mcg by mouth daily. [provider]  Active Self  EPINEPHrine  0.3 mg/0.3 mL IJ SOAJ injection [id number] Yes Inject 0.3 mg into the muscle as needed for anaphylaxis. [name], MD  Active Self           Med Note LESLY, RICHERD CINDERELLA Schaumann Jan 05, 2024  9:22 AM) Only takes as needed  Ferrous Sulfate  (IRON PO) [id number] Yes Take 1 tablet by mouth every morning. [provider]  Active Self           Med Note CARLON MYLINDA Kitchens May 11, 2021  2:15 PM)    folic acid  (FOLVITE ) 1 MG tablet [id number] Yes Take 1  mg by mouth daily. [provider]  Active Self  furosemide  (LASIX ) 40 MG tablet [id number] Yes Take 40 mg by mouth daily as needed (for weight gain of 3# or more). [provider]  Active   gabapentin  (NEURONTIN ) 100 MG capsule [id number]  Take 2 capsules (200 mg total) by mouth 3 (three) times daily.  Patient not taking: Reported on 04/13/2024   Josette Ade, MD  Active Self  glucose blood [facility] VERIO) test strip [id number] Yes Use to check blood sugar once daily Jimmy Ade FERNS, MD  Active Self  HYDROcodone -acetaminophen  (NORCO/VICODIN) 5-325 MG tablet [id number]  TAKE ONE TABLET BY MOUTH EVERY 6 HOURS AS NEEDED FOR PAIN  Patient not taking: Reported on 04/13/2024   Webb, Padonda B, FNP  Active Self  hydrocortisone  2.5 % cream [id number] Yes Apply topically 3 (three) times daily as needed.  Jimmy Ade FERNS, MD  Active Self           Med Note [facility], ELIZABETH A   Tue Dec 27, 2023  7:53 PM) PRN  isosorbide  mononitrate (IMDUR ) 60 MG 24 hr tablet [id number] Yes Take 1 tablet (60 mg total) by mouth daily before lunch. Gollan, Timothy J, MD  Active Self  latanoprost  (XALATAN ) 0.005 % ophthalmic solution [id number] Yes Place 1 drop into both eyes at bedtime. [provider]  Active Self  MAGNESIUM  PO [id number] Yes Take 1 tablet by mouth daily. [provider]  Active Self  melatonin 5 MG TABS [id number] Yes Take 1 tablet (5 mg total) by mouth at bedtime as needed. Josette Ade, MD  Active Self           Med Note LESLY, RICHERD CINDERELLA Schaumann Jan 05, 2024  9:25 AM) Only PRN but has it if needed  rosuvastatin  (CRESTOR ) 10 MG tablet [id number] Yes TAKE ONE TABLET BY MOUTH EVERY DAY Jimmy Ade FERNS, MD  Active Self            Recommendation:   PCP Follow-up Continue Current Plan of Care  Follow Up Plan:   Telephone follow-up in 1 month  [name], MSN, RN [facility]  [facility], [facility] RN Care Manager Direct Dial: [phone] Fax: [phone]

## 2024-04-13 NOTE — Patient Instructions (Signed)
 Visit Information  Thank you for taking time to visit with me today. Please don't hesitate to contact me if I can be of assistance to you before our next scheduled appointment.  Your next care management appointment is by telephone on 05/10/2024 at 3:30 pm  Telephone follow-up in 1 month  Please call the care guide team at [phone] if you need to cancel, schedule, or reschedule an appointment.   Please call the Suicide and Crisis Lifeline: 988 call the [place]  National Suicide Prevention Lifeline: 803-257-5290 or TTY: 919 838 6932 TTY [phone]) to talk to a trained counselor call 1-800-273-TALK (toll free, 24 hour hotline) go to [facility] [address], [place] [phone]) call 911 if you are experiencing a Mental Health or Behavioral Health Crisis or need someone to talk to.  Daniel Duos, MSN, RN [facility], [facility] RN Care Manager Direct Dial: [phone] Fax: [phone]   Heart Failure Action Plan A heart failure action plan helps you know what to do when you have symptoms of heart failure. Your action plan is a color-coded plan that lists the symptoms to watch for and indicates what actions to take. If you have symptoms in the green zone, you're doing well. If you have symptoms in the yellow zone, you're having problems. If you have symptoms in the red zone, you need medical care right away. Follow the plan that was created by you and your health care provider. Review your plan each time you visit your provider. Green zone These signs mean you're doing well and can continue what you're doing: You don't have new or worsening shortness of breath. You have very little swelling or no new swelling. Your weight is stable (no gain or loss). You have a normal activity level. You don't have chest pain or any other new symptoms. Yellow zone These signs and symptoms mean your condition may  be getting worse and you should make some changes: You have trouble breathing when you're active. You have swelling in your feet or legs or have discomfort in your belly. You gain 2-3 lb (0.9-1.4 kg) in 24 hours, or 5 lb (2.3 kg) in a week. This amount may be more or less depending on your condition. You get tired easily. You have trouble sleeping. You have a dry cough. If you have any of these symptoms: Contact your provider within the next day. Your provider may adjust your medicines. Red zone These signs and symptoms mean you should get medical help right away: You have trouble breathing when resting or cannot lie flat and you need to raise your head to help you breathe. You have a dry cough that's getting worse. You have swelling or pain in your feet or legs or discomfort in your belly that's getting worse. You suddenly gain more than 2-3 lb (0.9-1.4 kg) in 24 hours, or more than 5 lb (2.3 kg) in a week. This amount may be more or less depending on your condition. You have trouble staying awake or you feel confused. You don't have an appetite. You have worsening sadness or depression. These symptoms may be an emergency. Call 911 right away. Do not wait to see if the symptoms will go away. Do not drive yourself to the hospital. Follow these instructions at home: Take medicines only as told. Eat a heart-healthy diet. Work with a dietitian to create an eating plan that's best for you. Weigh yourself each day. Your target weight is __________ lb (__________  kg). Call your provider if you gain more than __________ lb (__________ kg) in 24 hours, or more than __________ lb (__________ kg) in a week. Health care provider name: _____________________________________________________ Health care provider phone number: _____________________________________________________ Where to find more information American Heart Association: heart.org This information is not intended to replace advice  given to you by your health care provider. Make sure you discuss any questions you have with your health care provider. Document Revised: 03/10/2023 Document Reviewed: 03/10/2023 Elsevier Patient Education  2024 Elsevier Inc.  Chronic Kidney Disease in Adults: What to Know Chronic kidney disease (CKD) is when lasting damage happens to the kidneys slowly over time. The kidneys are two organs that do many important things in the body. These include: Taking waste and extra fluid out of the blood to make pee (urine). Making hormones. Keeping the right amount of fluids and chemicals in the body. A small amount of kidney damage may not cause problems. You must take steps to help keep the kidney damage from getting worse. A lot of damage may cause kidney failure. Kidney failure means the kidneys can no longer work right. What are the causes? Diabetes. High blood pressure. Diseases that affect the heart and blood vessels. Other kidney diseases. Diseases that affect the body's defense system (immune system). A problem with the flow of pee. This may be caused by: Kidney stones. Cancer. An enlarged prostate, in males. A kidney infection or urinary tract infection (UTI) that keeps coming back. What increases the risk? Getting older. The chances of having CKD increase with age. A family history of kidney disease or kidney failure. Having a disease caused by genes. Taking medicines that can harm the kidneys. Being near or having contact with harmful substances. Being very overweight. Using tobacco now or in the past. What are the signs or symptoms? Common symptoms of CKD include: Feeling very tired and having less energy. Swelling of the face, legs, ankles, or feet. Throwing up or feeling like you may throw up. Not wanting to eat as much as normal. Being confused or not able to focus. Twitches and cramps in the leg muscles or other muscles. Dry, itchy skin. Other symptoms may  include: Shortness of breath. Trouble sleeping. Making less pee, or making more pee, especially at night. A taste of metal in your mouth. You may also become anemic. Anemia means there's not enough red blood cells in your blood. You may get symptoms slowly. You may not notice them until the kidney damage gets very bad. How is this diagnosed? CKD may be diagnosed based on: Tests on your blood or pee. Imaging tests, like an ultrasound or a CT scan. A kidney biopsy. For this test, a sample of kidney tissue is removed to be looked at under a microscope. These tests will help to find out how serious the CKD is. How is this treated? Often, there's no cure for CKD. Treatment can help with symptoms and help keep the disease from getting worse. Treatment may include: Treating other problems that are causing your CKD or making it worse. Diet changes. You may need to: Avoid alcohol. Avoid foods that are high in salt, potassium, phosphorous, and protein. Taking medicines for symptoms and to help control other conditions. Dialysis. This treatment gets harmful waste out of your body. It may be needed if you have kidney failure. Follow these instructions at home: Medicines Take your medicines only as told. The amount of some medicines you take may need to be changed. Do  not take any new medicines, vitamins, or supplements unless your health care provider says it's okay. These may make kidney damage worse. Lifestyle Do not smoke, vape, or use nicotine or tobacco. If you drink alcohol: Limit how much you have to: 0-1 drink a day if you're male. 0-2 drinks a day if you're male. Know how much alcohol is in your drink. In the [place] one drink is one 12 oz bottle of beer (355 mL), one 5 oz glass of wine (148 mL), or one 1 oz glass of hard liquor (44 mL). Stay at a healthy weight. If you need help, ask your provider. General instructions  Eat and drink as told. Track your blood pressure at home. Tell  your provider about any changes. If you have diabetes, track your blood sugar as told. Exercise at least 30 minutes a day, 5 days a week. Keep your shots (vaccinations) up to date. Keep all follow-up visits. Your provider may need to change your treatments over time. Where to find support American Kidney Fund: [place].com.au Kidney School: kidneyschool.org American Association of Kidney Patients: https://www.miller-montoya.com/ Where to find more information National Kidney Foundation: kidney.org Centers for Disease Control and Prevention. To learn more: Go to DiningCalendar.de. Click Search. Type chronic kidney disease in the search box. Contact a health care provider if: You have new symptoms. You get symptoms of end-stage kidney disease. These include: Headaches. Numbness in your hands or feet. Leg cramps. Easy bruising. Get help right away if: You have a fever. You make less pee than usual. You have pain or bleeding when you pee or poop. You have chest pain. You have shortness of breath. These symptoms may be an emergency. Call 911 right away. Do not wait to see if the symptoms will go away. Do not drive yourself to the hospital. This information is not intended to replace advice given to you by your health care provider. Make sure you discuss any questions you have with your health care provider. Document Revised: 06/07/2023 Document Reviewed: 01/28/2023 Elsevier Patient Education  2024 Elsevier Inc.  Hypertension, Adult High blood pressure (hypertension) is when the force of blood pumping through the arteries is too strong. The arteries are the blood vessels that carry blood from the heart throughout the body. Hypertension forces the heart to work harder to pump blood and may cause arteries to become narrow or stiff. Untreated or uncontrolled hypertension can lead to a heart attack, heart failure, a stroke, kidney disease, and other problems. A blood pressure reading consists of a higher number over a  lower number. Ideally, your blood pressure should be below 120/80. The first (top) number is called the systolic pressure. It is a measure of the pressure in your arteries as your heart beats. The second (bottom) number is called the diastolic pressure. It is a measure of the pressure in your arteries as the heart relaxes. What are the causes? The exact cause of this condition is not known. There are some conditions that result in high blood pressure. What increases the risk? Certain factors may make you more likely to develop high blood pressure. Some of these risk factors are under your control, including: Smoking. Not getting enough exercise or physical activity. Being overweight. Having too much fat, sugar, calories, or salt (sodium) in your diet. Drinking too much alcohol. Other risk factors include: Having a personal history of heart disease, diabetes, high cholesterol, or kidney disease. Stress. Having a family history of high blood pressure and high cholesterol. Having obstructive  sleep apnea. Age. The risk increases with age. What are the signs or symptoms? High blood pressure may not cause symptoms. Very high blood pressure (hypertensive crisis) may cause: Headache. Fast or irregular heartbeats (palpitations). Shortness of breath. Nosebleed. Nausea and vomiting. Vision changes. Severe chest pain, dizziness, and seizures. How is this diagnosed? This condition is diagnosed by measuring your blood pressure while you are seated, with your arm resting on a flat surface, your legs uncrossed, and your feet flat on the floor. The cuff of the blood pressure monitor will be placed directly against the skin of your upper arm at the level of your heart. Blood pressure should be measured at least twice using the same arm. Certain conditions can cause a difference in blood pressure between your right and left arms. If you have a high blood pressure reading during one visit or you have  normal blood pressure with other risk factors, you may be asked to: Return on a different day to have your blood pressure checked again. Monitor your blood pressure at home for 1 week or longer. If you are diagnosed with hypertension, you may have other blood or imaging tests to help your health care provider understand your overall risk for other conditions. How is this treated? This condition is treated by making healthy lifestyle changes, such as eating healthy foods, exercising more, and reducing your alcohol intake. You may be referred for counseling on a healthy diet and physical activity. Your health care provider may prescribe medicine if lifestyle changes are not enough to get your blood pressure under control and if: Your systolic blood pressure is above 130. Your diastolic blood pressure is above 80. Your personal target blood pressure may vary depending on your medical conditions, your age, and other factors. Follow these instructions at home: Eating and drinking  Eat a diet that is high in fiber and potassium, and low in sodium, added sugar, and fat. An example of this eating plan is called the DASH diet. DASH stands for Dietary Approaches to Stop Hypertension. To eat this way: Eat plenty of fresh fruits and vegetables. Try to fill one half of your plate at each meal with fruits and vegetables. Eat whole grains, such as whole-wheat pasta, brown rice, or whole-grain bread. Fill about one fourth of your plate with whole grains. Eat or drink low-fat dairy products, such as skim milk or low-fat yogurt. Avoid fatty cuts of meat, processed or cured meats, and poultry with skin. Fill about one fourth of your plate with lean proteins, such as fish, chicken without skin, beans, eggs, or tofu. Avoid pre-made and processed foods. These tend to be higher in sodium, added sugar, and fat. Reduce your daily sodium intake. Many people with hypertension should eat less than 1,500 mg of sodium a  day. Do not drink alcohol if: Your health care provider tells you not to drink. You are pregnant, may be pregnant, or are planning to become pregnant. If you drink alcohol: Limit how much you have to: 0-1 drink a day for women. 0-2 drinks a day for men. Know how much alcohol is in your drink. In the [place] one drink equals one 12 oz bottle of beer (355 mL), one 5 oz glass of wine (148 mL), or one 1 oz glass of hard liquor (44 mL). Lifestyle  Work with your health care provider to maintain a healthy body weight or to lose weight. Ask what an ideal weight is for you. Get at least 30 minutes of  exercise that causes your heart to beat faster (aerobic exercise) most days of the week. Activities may include walking, swimming, or biking. Include exercise to strengthen your muscles (resistance exercise), such as Pilates or lifting weights, as part of your weekly exercise routine. Try to do these types of exercises for 30 minutes at least 3 days a week. Do not use any products that contain nicotine or tobacco. These products include cigarettes, chewing tobacco, and vaping devices, such as e-cigarettes. If you need help quitting, ask your health care provider. Monitor your blood pressure at home as told by your health care provider. Keep all follow-up visits. This is important. Medicines Take over-the-counter and prescription medicines only as told by your health care provider. Follow directions carefully. Blood pressure medicines must be taken as prescribed. Do not skip doses of blood pressure medicine. Doing this puts you at risk for problems and can make the medicine less effective. Ask your health care provider about side effects or reactions to medicines that you should watch for. Contact a health care provider if you: Think you are having a reaction to a medicine you are taking. Have headaches that keep coming back (recurring). Feel dizzy. Have swelling in your ankles. Have trouble with your  vision. Get help right away if you: Develop a severe headache or confusion. Have unusual weakness or numbness. Feel faint. Have severe pain in your chest or abdomen. Vomit repeatedly. Have trouble breathing. These symptoms may be an emergency. Get help right away. Call 911. Do not wait to see if the symptoms will go away. Do not drive yourself to the hospital. Summary Hypertension is when the force of blood pumping through your arteries is too strong. If this condition is not controlled, it may put you at risk for serious complications. Your personal target blood pressure may vary depending on your medical conditions, your age, and other factors. For most people, a normal blood pressure is less than 120/80. Hypertension is treated with lifestyle changes, medicines, or a combination of both. Lifestyle changes include losing weight, eating a healthy, low-sodium diet, exercising more, and limiting alcohol. This information is not intended to replace advice given to you by your health care provider. Make sure you discuss any questions you have with your health care provider. Document Revised: 06/02/2021 Document Reviewed: 06/02/2021 Elsevier Patient Education  2024 Elsevier Inc.   Type 2 Diabetes Mellitus, Self-Care, Adult Caring for yourself after you have been diagnosed with type 2 diabetes (type 2 diabetes mellitus) means keeping your blood sugar (glucose) under control with a balance of: Nutrition. Exercise. Lifestyle changes. Medicines or insulin , if needed. Support from your team of health care providers and others. What are the risks? Having type 2 diabetes can put you at risk for other long-term (chronic) conditions, such as heart disease and kidney disease. Your health care provider may prescribe medicines to help prevent complications from diabetes. How to monitor your blood glucose  Check your blood glucose every day or as often as told by your health care provider. Have your  A1C (hemoglobin A1C) level checked two or more times a year, or as often as told by your health care provider. Your health care provider will set personalized treatment goals for you. Generally, the goal of treatment is to maintain the following blood glucose levels: Before meals: 80-130 mg/dL (4.4-7.2 mmol/L). After meals: below 180 mg/dL (10 mmol/L). A1C level: less than 7%. How to manage hyperglycemia and hypoglycemia Hyperglycemia symptoms Hyperglycemia, also called high blood glucose,  occurs when blood glucose is too high. Make sure you know the early signs of hyperglycemia, such as: Increased thirst. Hunger. Feeling very tired. Needing to urinate more often than usual. Blurry vision. Hypoglycemia symptoms Hypoglycemia, also called low blood glucose, occurs with a blood glucose level at or below 70 mg/dL (3.9 mmol/L). Diabetes medicines lower your blood glucose and can cause hypoglycemia. The risk for hypoglycemia increases during or after exercise, during sleep, during illness, and when skipping meals or not eating for a long time (fasting). It is important to know the symptoms of hypoglycemia and treat it right away. Always have a 15-gram rapid-acting carbohydrate snack with you to treat low blood glucose. Family members and close friends should also know the symptoms and understand how to treat hypoglycemia, in case you are not able to treat yourself. Symptoms may include: Hunger. Anxiety. Sweating and feeling clammy. Dizziness or feeling light-headed. Sleepiness. Increased heart rate. Irritability. Tingling or numbness around the mouth, lips, or tongue. Restless sleep. Severe hypoglycemia is when your blood glucose level is at or below 54 mg/dL (3 mmol/L). Severe hypoglycemia is an emergency. Do not wait to see if the symptoms will go away. Get medical help right away. Call your local emergency services (911 in the [place] Do not drive yourself to the hospital. If you have severe  hypoglycemia and you cannot eat or drink, you may need glucagon. A family member or close friend should learn how to check your blood glucose and how to give you glucagon. Ask your health care provider if you need to have an emergency glucagon kit available. Follow these instructions at home: Medicines Take prescribed insulin  or diabetes medicines as told by your health care provider. Do not run out of insulin  or other diabetes medicines. Plan ahead so you always have these available. If you use insulin , adjust your dosage based on your physical activity and what foods you eat. Your health care provider will tell you how to adjust your dosage. Take over-the-counter and prescription medicines only as told by your health care provider. Eating and drinking  What you eat and drink affects your blood glucose and your insulin  dosage. Making good choices helps to control your diabetes and prevent other health problems. A healthy meal plan includes eating lean proteins, complex carbohydrates, fresh fruits and vegetables, low-fat dairy products, and healthy fats. Make an appointment to see a registered dietitian to help you create an eating plan that is right for you. Make sure that you: Follow instructions from your health care provider about eating or drinking restrictions. Drink enough fluid to keep your urine pale yellow. Keep a record of the carbohydrates that you eat. Do this by reading food labels and learning the standard serving sizes of foods. Follow your sick-day plan whenever you cannot eat or drink as usual. Make this plan in advance with your health care provider.  Activity Stay active. Exercise regularly, as told by your health care provider. This may include: Stretching and doing strength exercises, such as yoga or weight lifting, two or more times a week. Doing 150 minutes or more of moderate-intensity or vigorous-intensity exercise each week. This could be brisk walking, biking, or water  aerobics. Spread out your activity over 3 or more days of the week. Do not go more than 2 days in a row without doing some kind of physical activity. When you start a new exercise or activity, work with your health care provider to adjust your insulin , medicines, or food intake  as needed. Lifestyle Do not use any products that contain nicotine or tobacco. These products include cigarettes, chewing tobacco, and vaping devices, such as e-cigarettes. If you need help quitting, ask your health care provider. If you drink alcohol and your health care provider says that it is safe for you: Limit how much you have to: 0-1 drink a day for women who are not pregnant. 0-2 drinks a day for men. Know how much alcohol is in your drink. In the [place] one drink equals one 12 oz bottle of beer (355 mL), one 5 oz glass of wine (148 mL), or one 1 oz glass of hard liquor (44 mL). Learn to manage stress. If you need help with this, ask your health care provider. Take care of your body  Keep your immunizations up to date. In addition to getting vaccinations as told by your health care provider, it is recommended that you get vaccinated against the following illnesses: The flu (influenza). Get a flu shot every year. Pneumonia. Hepatitis B. Schedule an eye exam soon after your diagnosis, and then one time every year after that. Check your skin and feet every day for cuts, bruises, redness, blisters, or sores. Schedule a foot exam with your health care provider once every year. Brush your teeth and gums two times a day, and floss one or more times a day. Visit your dentist one or more times every 6 months. Maintain a healthy weight. General instructions Share your diabetes management plan with people in your workplace, school, and household. Carry a medical alert card or wear medical alert jewelry. Keep all follow-up visits. This is important. Questions to ask your health care provider Should I meet with a  certified diabetes care and education specialist? Where can I find a support group for people with diabetes? Where to find more information For help and guidance and for more information about diabetes, please visit: American Diabetes Association (ADA): www.diabetes.org American Association of Diabetes Care and Education Specialists (ADCES): www.diabeteseducator.org International Diabetes Federation (IDF): DCOnly.dk Summary Caring for yourself after you have been diagnosed with type 2 diabetes (type 2 diabetes mellitus) means keeping your blood sugar (glucose) under control with a balance of nutrition, exercise, lifestyle changes, and medicine. Check your blood glucose every day, as often as told by your health care provider. Having diabetes can put you at risk for other long-term (chronic) conditions, such as heart disease and kidney disease. Your health care provider may prescribe medicines to help prevent complications from diabetes. Share your diabetes management plan with people in your workplace, school, and household. Keep all follow-up visits. This is important. This information is not intended to replace advice given to you by your health care provider. Make sure you discuss any questions you have with your health care provider. Document Revised: 12/24/2020 Document Reviewed: 12/24/2020 Elsevier Patient Education  2024 [employer].

## 2024-04-16 DIAGNOSIS — Z961 Presence of intraocular lens: Secondary | ICD-10-CM | POA: Diagnosis not present

## 2024-04-16 DIAGNOSIS — E119 Type 2 diabetes mellitus without complications: Secondary | ICD-10-CM | POA: Diagnosis not present

## 2024-04-16 DIAGNOSIS — H401131 Primary open-angle glaucoma, bilateral, mild stage: Secondary | ICD-10-CM | POA: Diagnosis not present

## 2024-04-16 DIAGNOSIS — H2512 Age-related nuclear cataract, left eye: Secondary | ICD-10-CM | POA: Diagnosis not present

## 2024-04-16 LAB — HM DIABETES EYE EXAM

## 2024-04-17 DIAGNOSIS — N2581 Secondary hyperparathyroidism of renal origin: Secondary | ICD-10-CM | POA: Diagnosis not present

## 2024-04-17 DIAGNOSIS — I10 Essential (primary) hypertension: Secondary | ICD-10-CM | POA: Diagnosis not present

## 2024-04-17 DIAGNOSIS — I129 Hypertensive chronic kidney disease with stage 1 through stage 4 chronic kidney disease, or unspecified chronic kidney disease: Secondary | ICD-10-CM | POA: Diagnosis not present

## 2024-04-17 DIAGNOSIS — E1122 Type 2 diabetes mellitus with diabetic chronic kidney disease: Secondary | ICD-10-CM | POA: Diagnosis not present

## 2024-04-17 DIAGNOSIS — N1832 Chronic kidney disease, stage 3b: Secondary | ICD-10-CM | POA: Diagnosis not present

## 2024-04-23 DIAGNOSIS — E1142 Type 2 diabetes mellitus with diabetic polyneuropathy: Secondary | ICD-10-CM | POA: Diagnosis not present

## 2024-04-23 DIAGNOSIS — L853 Xerosis cutis: Secondary | ICD-10-CM | POA: Diagnosis not present

## 2024-04-23 DIAGNOSIS — L309 Dermatitis, unspecified: Secondary | ICD-10-CM | POA: Diagnosis not present

## 2024-04-23 DIAGNOSIS — L84 Corns and callosities: Secondary | ICD-10-CM | POA: Diagnosis not present

## 2024-04-23 DIAGNOSIS — B351 Tinea unguium: Secondary | ICD-10-CM | POA: Diagnosis not present

## 2024-04-24 DIAGNOSIS — C84A0 Cutaneous T-cell lymphoma, unspecified, unspecified site: Secondary | ICD-10-CM | POA: Diagnosis not present

## 2024-04-24 DIAGNOSIS — L97818 Non-pressure chronic ulcer of other part of right lower leg with other specified severity: Secondary | ICD-10-CM | POA: Diagnosis not present

## 2024-04-24 DIAGNOSIS — D631 Anemia in chronic kidney disease: Secondary | ICD-10-CM | POA: Diagnosis not present

## 2024-04-24 DIAGNOSIS — L97828 Non-pressure chronic ulcer of other part of left lower leg with other specified severity: Secondary | ICD-10-CM | POA: Diagnosis not present

## 2024-04-24 DIAGNOSIS — I872 Venous insufficiency (chronic) (peripheral): Secondary | ICD-10-CM | POA: Diagnosis not present

## 2024-04-24 DIAGNOSIS — N184 Chronic kidney disease, stage 4 (severe): Secondary | ICD-10-CM | POA: Diagnosis not present

## 2024-04-24 DIAGNOSIS — I5032 Chronic diastolic (congestive) heart failure: Secondary | ICD-10-CM | POA: Diagnosis not present

## 2024-04-24 DIAGNOSIS — I480 Paroxysmal atrial fibrillation: Secondary | ICD-10-CM | POA: Diagnosis not present

## 2024-04-24 DIAGNOSIS — C8590 Non-Hodgkin lymphoma, unspecified, unspecified site: Secondary | ICD-10-CM | POA: Diagnosis not present

## 2024-04-24 DIAGNOSIS — E1140 Type 2 diabetes mellitus with diabetic neuropathy, unspecified: Secondary | ICD-10-CM | POA: Diagnosis not present

## 2024-04-24 DIAGNOSIS — I130 Hypertensive heart and chronic kidney disease with heart failure and stage 1 through stage 4 chronic kidney disease, or unspecified chronic kidney disease: Secondary | ICD-10-CM | POA: Diagnosis not present

## 2024-04-24 DIAGNOSIS — E1122 Type 2 diabetes mellitus with diabetic chronic kidney disease: Secondary | ICD-10-CM | POA: Diagnosis not present

## 2024-05-01 ENCOUNTER — Other Ambulatory Visit: Payer: Self-pay

## 2024-05-01 MED ORDER — CLORAZEPATE DIPOTASSIUM 7.5 MG PO TABS: ORAL_TABLET | ORAL | 0 refills | Status: DC

## 2024-05-01 NOTE — Telephone Encounter (Signed)
 Last filled 12-19-23 #60 Last OV 04-02-24 Next OV 07-03-24 [facility]

## 2024-05-03 ENCOUNTER — Ambulatory Visit (INDEPENDENT_AMBULATORY_CARE_PROVIDER_SITE_OTHER): Admitting: Vascular Surgery

## 2024-05-03 ENCOUNTER — Telehealth (INDEPENDENT_AMBULATORY_CARE_PROVIDER_SITE_OTHER): Payer: Self-pay | Admitting: Vascular Surgery

## 2024-05-03 VITALS — BP 166/80 | HR 71 | Resp 18 | Wt 225.0 lb

## 2024-05-03 DIAGNOSIS — E1121 Type 2 diabetes mellitus with diabetic nephropathy: Secondary | ICD-10-CM | POA: Diagnosis not present

## 2024-05-03 DIAGNOSIS — I872 Venous insufficiency (chronic) (peripheral): Secondary | ICD-10-CM | POA: Diagnosis not present

## 2024-05-03 DIAGNOSIS — I10 Essential (primary) hypertension: Secondary | ICD-10-CM

## 2024-05-03 DIAGNOSIS — E7800 Pure hypercholesterolemia, unspecified: Secondary | ICD-10-CM | POA: Diagnosis not present

## 2024-05-03 NOTE — Progress Notes (Signed)
 Per NP both legs and feet wrapped with kerlix and covered with ace bandages. Pt tolerated well voiced understanding that he may continue to use hydrocortisone  under wraps at home when changing bandages.   Dois Seip CMA

## 2024-05-03 NOTE — Telephone Encounter (Signed)
 Patient a voicemail having addition questions for [facility]. Patient was seen today. He asked if it is okay for him to use Eucerin cream? Also is it okay to wear black compression hose instead of white? Will patient need a prescription to get these hose?  Please advise.

## 2024-05-04 ENCOUNTER — Telehealth: Payer: Self-pay

## 2024-05-04 NOTE — Telephone Encounter (Signed)
 Copied from CRM #[id number]. Topic: General - Other >> May 04, 2024 10:18 AM Kevelyn M wrote: Reason for CRM: Patient needs another provider because she can't sign his home health order until she gets her [place] enrolled. Alan needs to know who the physician is to change it on the chart.  Call back #[phone] opt 2

## 2024-05-04 NOTE — Telephone Encounter (Signed)
 My team lead and I are discussing what we need to do and how we need to do it. It may be easier to assign specific pts to a specific provider.

## 2024-05-07 DIAGNOSIS — H2512 Age-related nuclear cataract, left eye: Secondary | ICD-10-CM | POA: Diagnosis not present

## 2024-05-07 DIAGNOSIS — H2513 Age-related nuclear cataract, bilateral: Secondary | ICD-10-CM | POA: Diagnosis not present

## 2024-05-07 NOTE — Telephone Encounter (Signed)
 Called and spoke to [place] to let her know Dr Charlyn Medicare application has been approved so she can do Home Health orders now.

## 2024-05-08 ENCOUNTER — Telehealth (INDEPENDENT_AMBULATORY_CARE_PROVIDER_SITE_OTHER): Payer: Self-pay | Admitting: Vascular Surgery

## 2024-05-08 DIAGNOSIS — R809 Proteinuria, unspecified: Secondary | ICD-10-CM | POA: Diagnosis not present

## 2024-05-08 DIAGNOSIS — N184 Chronic kidney disease, stage 4 (severe): Secondary | ICD-10-CM | POA: Diagnosis not present

## 2024-05-08 DIAGNOSIS — N2889 Other specified disorders of kidney and ureter: Secondary | ICD-10-CM | POA: Diagnosis not present

## 2024-05-08 DIAGNOSIS — I129 Hypertensive chronic kidney disease with stage 1 through stage 4 chronic kidney disease, or unspecified chronic kidney disease: Secondary | ICD-10-CM | POA: Diagnosis not present

## 2024-05-08 DIAGNOSIS — N2581 Secondary hyperparathyroidism of renal origin: Secondary | ICD-10-CM | POA: Diagnosis not present

## 2024-05-08 DIAGNOSIS — E1122 Type 2 diabetes mellitus with diabetic chronic kidney disease: Secondary | ICD-10-CM | POA: Diagnosis not present

## 2024-05-08 DIAGNOSIS — D631 Anemia in chronic kidney disease: Secondary | ICD-10-CM | POA: Diagnosis not present

## 2024-05-08 DIAGNOSIS — E785 Hyperlipidemia, unspecified: Secondary | ICD-10-CM | POA: Diagnosis not present

## 2024-05-08 NOTE — Telephone Encounter (Signed)
 Mandy with Home Health left message regarding patient wound care.  There are some miscommunications. Not sure if patient is to continue with the wrap or patient is supposed to wear compression instead.  Please advise. If patient need to continue the wrap with home health, another order need to be placed.

## 2024-05-09 NOTE — Telephone Encounter (Signed)
 Call from [place] with [facility] wanting to know the plan of care for this patient as he has advised them he is to not be doing unna any longer. Spoke to NP BP and confirmed that patient is to stop using unna and to follow up with wound care and start use of compression stockings. Call back to [facility] to advise the above @ [phone] opt 2 did not reach left her detailed message with our contact information if she has any further questions.

## 2024-05-10 ENCOUNTER — Encounter: Payer: Self-pay | Admitting: Ophthalmology

## 2024-05-10 ENCOUNTER — Other Ambulatory Visit: Payer: Self-pay

## 2024-05-10 ENCOUNTER — Telehealth: Payer: Self-pay

## 2024-05-10 NOTE — Telephone Encounter (Deleted)
 Copied from CRM [id number]. Topic: Clinical - Home Health Verbal Orders >> May 10, 2024  4:05 PM Alfonso HERO wrote: Caller/Agency: Amanda/ [facility] Callback Number: [phone] option 2 Service Requested: Skilled Nursing Frequency: Patient wanting to discontinue nursing services. He is refusing to have leg wraps on. Any new concerns about the patient? Yes  Please call home health agency with verbal orders to discontinue the nursing portion.SABRASABRASABRA

## 2024-05-10 NOTE — Anesthesia Preprocedure Evaluation (Addendum)
 Anesthesia Evaluation  Patient identified by MRN, date of birth, ID band Patient awake    Reviewed: Allergy & Precautions, H&P , NPO status , Patient's Chart, lab work & pertinent test results  History of Anesthesia Complications (+) PONV, Family history of anesthesia reaction and history of anesthetic complications  Airway Mallampati: II  TM Distance: <3 FB Neck ROM: Full    Dental no notable dental hx. (+) Edentulous Upper, Edentulous Lower   Pulmonary shortness of breath, sleep apnea , former smoker Home oxygen at night   Pulmonary exam normal breath sounds clear to auscultation       Cardiovascular hypertension, +CHF  Normal cardiovascular exam Rhythm:Regular Rate:Normal  02-10-24 echo 1. Left ventricular ejection fraction, by estimation, is 60 to 65%. Left  ventricular ejection fraction by 2D MOD biplane is 68.2 %. The left  ventricle has normal function. The left ventricle has no regional wall  motion abnormalities. Left ventricular  diastolic parameters are consistent with Grade I diastolic dysfunction  (impaired relaxation).   2. Right ventricular systolic function is normal. The right ventricular  size is normal.   3. The mitral valve is normal in structure. Mild mitral valve  regurgitation. No evidence of mitral stenosis.   4. The aortic valve is normal in structure. Aortic valve regurgitation is  not visualized. No aortic stenosis is present.   5. The inferior vena cava is dilated in size with >50% respiratory  variability, suggesting right atrial pressure of 8 mmHg.     Neuro/Psych  PSYCHIATRIC DISORDERS Anxiety Depression    negative neurological ROS  negative psych ROS   GI/Hepatic negative GI ROS, Neg liver ROS, PUD,GERD  ,,  Endo/Other  diabetes    Renal/GU Renal diseasenegative Renal ROS  negative genitourinary   Musculoskeletal negative musculoskeletal ROS (+) Arthritis ,    Abdominal    Peds negative pediatric ROS (+)  Hematology negative hematology ROS (+) Blood dyscrasia, anemia   Anesthesia Other Findings On home oxygen at night, 02 is 2L/m/n/c   HTN (hypertension) MRSA (methicillin resistant Staphylococcus aureus)  Diabetes mellitus without complication Arthritis  Hard of hearing  POLIOMYELITIS--affects right arm, 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  HLD (hyperlipidemia)  Hx of squamous cell carcinoma OSA (obstructive sleep apnea) on BiPap  CKD (chronic kidney disease), 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 Anxiety Complication of anesthesia  PONV (postoperative nausea and vomiting)  Family history of adverse reaction to anesthesia--unknown, patient adopted  CTCL (cutaneous T-cell lymphoma) (HCC) Hx of dysplastic nevus  Dysplastic nevus Dysplastic nevus  Squamous cell carcinoma of skin Atrial flutter (HCC)  Chronic heart failure with preserved ejection fraction (HFpEF) (HCC)    Reproductive/Obstetrics negative OB ROS                              Anesthesia Physical Anesthesia Plan  ASA: 3  Anesthesia Plan:    Post-op Pain Management:    Induction:   PONV Risk Score and Plan:   Airway Management Planned:   Additional Equipment:   Intra-op Plan:   Post-operative Plan:   Informed Consent:   Plan Discussed with:   Anesthesia Plan Comments:          Anesthesia Quick Evaluation
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
## 2024-05-10 NOTE — Telephone Encounter (Signed)
 Sign at exiting of workspace     Copied from CRM [id number]. Topic: Clinical - Home Health Verbal Orders >> May 10, 2024  4:05 PM Daniel Kline wrote: Caller/Agency: Amanda/ [facility] Callback Number: [phone] option 2 Service Requested: Skilled Nursing Frequency: Patient wanting to discontinue nursing services. He is refusing to have leg wraps on. Any new concerns about the patient? Yes   Please call home health agency with verbal orders to discontinue the nursing portion....       There are orders in Dr Charlyn inbox for re certification, I believe. We will not need to send them back if it is not wanted by pt.

## 2024-05-10 NOTE — Patient Instructions (Signed)
 Visit Information  Thank you for taking time to visit with me today. Please don't hesitate to contact me if I can be of assistance to you before our next scheduled appointment.  Your next care management appointment is by telephone on 05/30/24 at 2:00 pm  Telephone follow-up 3 Mahmood Boehringer  Please call the care guide team at [phone] if you need to cancel, schedule, or reschedule an appointment.   Please call the Suicide and Crisis Lifeline: 988 call the [place]  National Suicide Prevention Lifeline: 915-734-1295 or TTY: 719-888-4247 TTY 502-103-1481) to talk to a trained counselor call 1-800-273-TALK (toll free, 24 hour hotline) go to [facility] [address], [place] [phone]) call 911 if you are experiencing a Mental Health or Behavioral Health Crisis or need someone to talk to.  [name], MSN, RN [place]  [facility], [facility] RN Care Manager Direct Dial: [phone] Fax: [phone]  Chronic Kidney Disease in Adults: What to Know Chronic kidney disease (CKD) is when lasting damage happens to the kidneys slowly over time. The kidneys are two organs that do many important things in the body. These include: Taking waste and extra fluid out of the blood to make pee (urine). Making hormones. Keeping the right amount of fluids and chemicals in the body. A small amount of kidney damage may not cause problems. You must take steps to help keep the kidney damage from getting worse. A lot of damage may cause kidney failure. Kidney failure means the kidneys can no longer work right. What are the causes? Diabetes. High blood pressure. Diseases that affect the heart and blood vessels. Other kidney diseases. Diseases that affect the body's defense system (immune system). A problem with the flow of pee. This may be caused by: Kidney stones. Cancer. An enlarged prostate, in males. A kidney infection or urinary  tract infection (UTI) that keeps coming back. What increases the risk? Getting older. The chances of having CKD increase with age. A family history of kidney disease or kidney failure. Having a disease caused by genes. Taking medicines that can harm the kidneys. Being near or having contact with harmful substances. Being very overweight. Using tobacco now or in the past. What are the signs or symptoms? Common symptoms of CKD include: Feeling very tired and having less energy. Swelling of the face, legs, ankles, or feet. Throwing up or feeling like you may throw up. Not wanting to eat as much as normal. Being confused or not able to focus. Twitches and cramps in the leg muscles or other muscles. Dry, itchy skin. Other symptoms may include: Shortness of breath. Trouble sleeping. Making less pee, or making more pee, especially at night. A taste of metal in your mouth. You may also become anemic. Anemia means there's not enough red blood cells in your blood. You may get symptoms slowly. You may not notice them until the kidney damage gets very bad. How is this diagnosed? CKD may be diagnosed based on: Tests on your blood or pee. Imaging tests, like an ultrasound or a CT scan. A kidney biopsy. For this test, a sample of kidney tissue is removed to be looked at under a microscope. These tests will help to find out how serious the CKD is. How is this treated? Often, there's no cure for CKD. Treatment can help with symptoms and help keep the disease from getting worse. Treatment may include: Treating other problems that are causing your CKD or making it worse. Diet changes. You  may need to: Avoid alcohol. Avoid foods that are high in salt, potassium, phosphorous, and protein. Taking medicines for symptoms and to help control other conditions. Dialysis. This treatment gets harmful waste out of your body. It may be needed if you have kidney failure. Follow these instructions at  home: Medicines Take your medicines only as told. The amount of some medicines you take may need to be changed. Do not take any new medicines, vitamins, or supplements unless your health care provider says it's okay. These may make kidney damage worse. Lifestyle Do not smoke, vape, or use nicotine or tobacco. If you drink alcohol: Limit how much you have to: 0-1 drink a day if you're male. 0-2 drinks a day if you're male. Know how much alcohol is in your drink. In the [place] one drink is one 12 oz bottle of beer (355 mL), one 5 oz glass of wine (148 mL), or one 1 oz glass of hard liquor (44 mL). Stay at a healthy weight. If you need help, ask your provider. General instructions  Eat and drink as told. Track your blood pressure at home. Tell your provider about any changes. If you have diabetes, track your blood sugar as told. Exercise at least 30 minutes a day, 5 days a week. Keep your shots (vaccinations) up to date. Keep all follow-up visits. Your provider may need to change your treatments over time. Where to find support American Kidney Fund: [place].com.au Kidney School: kidneyschool.org American Association of Kidney Patients: https://www.miller-montoya.com/ Where to find more information National Kidney Foundation: kidney.org Centers for Disease Control and Prevention. To learn more: Go to DiningCalendar.de. Click Search. Type chronic kidney disease in the search box. Contact a health care provider if: You have new symptoms. You get symptoms of end-stage kidney disease. These include: Headaches. Numbness in your hands or feet. Leg cramps. Easy bruising. Get help right away if: You have a fever. You make less pee than usual. You have pain or bleeding when you pee or poop. You have chest pain. You have shortness of breath. These symptoms may be an emergency. Call 911 right away. Do not wait to see if the symptoms will go away. Do not drive yourself to the hospital. This information is not  intended to replace advice given to you by your health care provider. Make sure you discuss any questions you have with your health care provider. Document Revised: 06/07/2023 Document Reviewed: 01/28/2023 Elsevier Patient Education  2024 Elsevier Inc.  Chronic Kidney Disease: Eating Plan Chronic kidney disease (CKD) is when your kidneys aren't working well. They can't remove waste, fluids, and other substances from your blood. When these substances build up, they can worsen kidney damage and affect your health. Eating certain foods can lead to a buildup of these substances. Changing your diet can help prevent more kidney damage. Diet changes may also delay dialysis or even keep you from needing it. What nutrients should I limit? Work with your health care team and an expert in healthy eating (dietitian) to make a meal plan that's right for you. Foods you can eat and foods you should limit or avoid will depend on: The stage of your kidney disease. Any other conditions you have. The items listed below are not a complete list. Talk with a dietitian to learn what's best for you. Potassium Potassium affects how well your heart beats. Too much potassium in your blood can cause an irregular heartbeat or even a heart attack. You may need to limit foods  that are high in potassium, such as: Liquid milk and soy milk. Salt substitutes that contain potassium. Fruits like: Bananas. Apricots. Melon. Prunes and raisins. Kiwi. Nectarines and oranges. Vegetables, such as: Potatoes, sweet potatoes, and yams. Tomatoes. Leafy greens. Beets. Avocado. Pumpkin and winter squash. Beans, like lima beans. Nuts. Phosphorus Phosphorus is a mineral found in your bones. You need a balance between calcium  and phosphorus to build and maintain healthy bones.  Too much added phosphorus from the foods you eat can pull calcium  from your bones. Losing calcium  can make your bones weak and more likely to break. Too  much phosphorus can also make your skin itch. You may need to limit foods that are high in phosphorus or that have added phosphorus, such as: Liquid milk and dairy products. Dark-colored sodas or soft drinks. Bran cereals and oatmeal. Protein  Protein helps your body make and keep muscle. Protein also helps to repair your body's cells and tissues.  One of the natural breakdown products of protein is a waste product called urea. When your kidneys aren't working well, they can't remove waste like urea. Reducing protein in your diet can help keep urea from building up in your blood. Depending on your stage of kidney disease, you may need to eat smaller portions of foods that are high in protein. Sources of animal protein include: Meat (all types). Fish and seafood. Poultry. Eggs. Dairy. Other protein foods include: Beans and legumes. Nuts and nut butter. Soy, like tofu.  Sodium Salt (sodium) helps to keep a healthy balance of fluids in your body. Too much salt can increase your blood pressure, which can harm your heart and lungs. Extra salt can also cause your body to keep too much fluid, making your kidneys work harder. You may need to limit or avoid foods that are high in salt, such as: Salt seasonings. Soy and teriyaki sauce. Meats that are: Packaged. Precooked. Cured. Processed. Salted crackers and snack foods. Fast food. Canned soups and foods. Pickled foods. Boxed mixes or ready-to-eat boxed meals and side dishes. Bottled dressings, sauces, and marinades. Talk with your dietitian about how much potassium, phosphorus, protein, and salt you may have each day. What are tips for following this plan? Reading food labels  Check the amount of salt in foods. Limit foods that have salt listed among the first five ingredients. Try to eat low-salt foods. Check the ingredient list for added phosphorus or potassium. Phos in an ingredient is a sign that phosphorus has been added. Do  not buy foods that are calcium -enriched or that have calcium  added to them (fortified). Buy canned vegetables and beans that say no salt added. Rinse them before eating. Lifestyle Limit the amount of protein you eat from animal sources each day. Focus on protein from plant sources, like tofu and dried beans, peas, and lentils. Do not add salt to food when cooking or before eating. Do not eat star fruit. It can be toxic for people with kidney problems. Talk with your health care provider before taking any vitamin or mineral supplements. If told by your provider: Track how much liquid you have so you can avoid drinking too much. Try to eat foods that are made mostly from water, like gelatin, ice cream, soups, and juicy fruits and vegetables. If you have diabetes and chronic kidney disease: If you have diabetes and CKD, you need to keep your blood sugar (glucose) in the target range recommended by your provider. Follow your diabetes management plan. This may include:  Checking your blood glucose regularly. Taking medicines by mouth, or taking insulin , or both. Exercising for at least 30 minutes on 5 or more days each week, or as told by your provider. Tracking how many servings of carbohydrates you eat at each meal. Not using orange juice to treat low blood sugars. Instead, use apple juice, cranberry juice, or clear soda. You may be given guidelines on what foods and nutrients you may eat, and how much you can have each day. This depends on your stage of kidney disease and whether you have high blood pressure. Follow the meal plan your dietitian gives you. Where to find more information [employer] of Diabetes and Digestive and Kidney Diseases: StageSync.si National Kidney Foundation: kidney.org This information is not intended to replace advice given to you by your health care provider. Make sure you discuss any questions you have with your health care provider. Document Revised:  03/08/2023 Document Reviewed: 03/08/2023 Elsevier Patient Education  2024 Elsevier Inc.   Dialysis: What Is It? Dialysis is a treatment you may need if your kidneys aren't working the way they should. It takes waste, salt, and extra water out of your blood. If your kidneys have been damaged, dialysis may be needed for just a short time. You can stop the treatments when your kidneys heal and start working again. If you have kidney failure or end-stage kidney disease, your kidneys may have stopped working. You'll need dialysis the rest of your life, or until you get a new kidney from a donor. This is called a kidney transplant. Types of dialysis There are two main types of dialysis: hemodialysis and peritoneal dialysis. Below you can learn more about these two types, along with some of the pros and cons of each. Hemodialysis  Hemodialysis, or HD, is done with a machine. It takes out your blood, filters it, and puts it back in your body. This is done at a hospital or dialysis center three times a week. Each treatment takes 3 to 5 hours. Some people can get HD at home with the help of a family member or caregiver who has had special training. Before you start HD, you'll need minor surgery to create a vascular access. Your access is made in a blood vessel. It's used to connect to the machine. Pros of hemodialysis It's done less often than peritoneal dialysis. Someone else does the treatment for you. If you go to a dialysis center: Your health care providers can spot any problems you may be having. You can talk with and get support from others. Cons of hemodialysis It can cause muscle cramps and low blood pressure. You may feel tired on the days you get treatment. You'll need appointments 3 days a week and will have to work around the dialysis center's schedule. You'll need a way to get to the center, even in bad weather. You'll need to take extra care when traveling. If you get treatment in a  dialysis center, you'll need to arrange to visit a center wherever you go. If you do your treatments at home, you'll need to take the machine and supplies with you when you travel.  Peritoneal dialysis  Peritoneal dialysis, or PD, is when your blood is filtered inside your own body. It's done through the thin lining of your belly that's called the peritoneum. It uses a fluid called dialysate.  The dialysate fluid is put into your belly, where it draws in wastes, salt, and extra water from the blood. Then the fluid  is drained from your body. This is called an exchange. PD can be done at home or almost anywhere. And after training, most people can do it on their own. You may need up to five exchanges a day. Each exchange takes 30 to 40 minutes. Between exchanges, the fluid is in your body for 1 to 3 hours. But this can vary with each person. You can also do this treatment at night while you sleep, using a machine called a cycler. Before you start PD, surgery is done to put a soft tube, called a catheter, in your belly. The tube sticks out of your skin. It's where you move the fluid in and out of your body. Pros of peritoneal dialysis It's less likely than HD to cause side effects like cramps and low blood pressure. The diet changes are less strict than with HD. You can do exchanges on your own wherever you are, including when you travel. You can do PD every night. Cons of peritoneal dialysis More treatments are needed every day than with HD. You'll have to take care of the catheter site in your belly every day. You need to be able to handle the equipment and lift the heavy bags of dialysate. You'll need a large space that's clean and dry to store supplies. You'll need to learn how to keep your equipment clean and free of germs to help prevent infection.  Talk with your provider Your provider will help you decide which type is best for you. You'll talk about your lifestyle, what you prefer, and  your overall health. Both types of dialysis are safe treatments. Both work well to remove waste and extra fluid from your body when your kidneys can't do this. You can also ask to talk to people getting dialysis to learn more from them. Learn as much as you can and ask questions so you can decide what's best for you. Where to find more information You can learn more and find support online at: [employer]: kidney.org American Kidney Fund: kidneyfund.org This information is not intended to replace advice given to you by your health care provider. Make sure you discuss any questions you have with your health care provider. Document Revised: 01/19/2023 Document Reviewed: 01/19/2023 Elsevier Patient Education  2024 Elsevier Inc.   Palliative Care Palliative care can help make your quality of life better if you have a very serious illness. It involves care of your body, mind, and spirit. It's based on what you need and want in this stage of life. In many cases, care may take place in a hospital or long-term care setting. It may include: Help with pain and other symptoms. Family support. Spiritual support. Emotional support. Social support. Palliative care can help bring you comfort and peace of mind. It can be helpful to you and your family during the course of an illness. What is the difference between palliative care and hospice? Palliative care and hospice have similar goals. They both aim to: Help with symptoms. Provide comfort. Make your quality of life better. Maintain your dignity. They're different in that palliative care can be given: At the same time as other treatments. During any phase of a serious illness, from diagnosis to cure. Hospice care is given when: You're thought to have 6 months or less to live. You no longer can, or want to, try to find a cure for your illness. Who can get palliative care services? Palliative care is given to children and adults  who are  very ill. It may be offered if: You're not responding well to treatment. You need help with pain. You have side effects from treatment that are hard to manage. You have symptoms from the effects of surgery. You have been diagnosed with a disease that: Is advanced. Will shorten your life. Your health care provider may talk with you about palliative care if they think the support would help you. Your family and friends may also get help to manage stress and other concerns. Who makes up the palliative care team? The care team includes: You and your family. Your providers and specialists. Nurses. A Child psychotherapist, psychologist, or psychiatrist. Based on your needs, the team may also include: A pain specialist. A hospice specialist. Someone to help with finances or insurance. Religious or spiritual leaders. A case Production designer, theatre/television/film. A grief counselor. How will the palliative care team help me and my family? The team will speak with you and your family about: Your symptoms, such as: Pain. Nausea. Vomiting. Shortness of breath. The need for specialists. Advance directives. These may include: Living wills. Health care proxies. Symptoms that have to do with your mental health, such as: Stress. Depression. This is when you feel sad or hopeless. Anxiety. This is when you feel worried or nervous. Treatment options and how to keep as much as possible of your: Function. Ability to move around. Spiritual wishes, such as: Rituals. Prayer. Things you can do to leave a legacy or make memories. Life and death as a normal process. End-of-life care. The team can also help you talk about: Hard issues. Spiritual concerns. Emotional concerns. Where to find more information [employer] on Aging (NIA): BaseRingTones.pl National Hospice and Palliative Care Organization [facility]): [email or web address]-phillips.[email or web address]/ This information is not intended to replace advice given to you by your health care provider. Make sure  you discuss any questions you have with your health care provider. Document Revised: 12/13/2022 Document Reviewed: 12/13/2022 Elsevier Patient Education  2024 [employer].

## 2024-05-14 ENCOUNTER — Encounter (INDEPENDENT_AMBULATORY_CARE_PROVIDER_SITE_OTHER): Payer: Self-pay | Admitting: Vascular Surgery

## 2024-05-14 ENCOUNTER — Encounter: Payer: Self-pay | Admitting: Ophthalmology

## 2024-05-14 NOTE — Telephone Encounter (Signed)
 Okay to approve verbal order as requested.

## 2024-05-14 NOTE — Progress Notes (Signed)
 Subjective:    Patient ID: Daniel Kline, male    DOB: Mar 02, 1946, 78 y.o.   MRN: [id number] Chief Complaint  Patient presents with   Follow-up    Daniel Kline is a 78 yo male who presents to clinic today for follow up of bilateral lower extremity Swelling.  Patient was placed in Unna boot wraps 5 weeks ago and he returns today for a follow-up evaluation.  Patient endorses he feels better in general and his legs are less swollen.  He has skin scaling with intermittent blisters and small scabs where he is scratching at his legs that are not currently healed. This is most likely related to his skin cancer. He has been unable to use his light box therapy due to his lower legs due to unna boot wraps.  Edema today he endorses is much better post unna boot wrapping over the past 4 weeks. He endorses he would like to switch back to compression socks.  Today he endorses that he started riding an exercise bike for exercise as he has other difficulties such as back pain which limits his ability to walk.  I am okay with that I told him to continue to exercise is much as he can at this time with this in the boots.    Review of Systems  Constitutional: Negative.   Cardiovascular:  Positive for leg swelling.  Musculoskeletal:  Positive for myalgias.  Skin:  Positive for color change.  All other systems reviewed and are negative.      Objective:   Physical Exam Vitals reviewed.  Constitutional:      Appearance: Normal appearance. He is obese.  HENT:     Head: Normocephalic.  Eyes:     Pupils: Pupils are equal, round, and reactive to light.  Cardiovascular:     Rate and Rhythm: Normal rate and regular rhythm.     Pulses: Normal pulses.     Heart sounds: Normal heart sounds.  Pulmonary:     Effort: Pulmonary effort is normal.     Breath sounds: Normal breath sounds.  Abdominal:     General: Bowel sounds are normal.     Palpations: Abdomen is soft.  Musculoskeletal:     Right lower leg:  Edema present.     Left lower leg: Edema present.  Skin:    General: Skin is warm and dry.     Capillary Refill: Capillary refill takes 2 to 3 seconds.     Comments: Chronic purple discoloration due to vascular insufficiency.  Neurological:     General: No focal deficit present.     Mental Status: He is alert and oriented to person, place, and time. Mental status is at baseline.  Psychiatric:        Mood and Affect: Mood normal.        Behavior: Behavior normal.        Thought Content: Thought content normal.        Judgment: Judgment normal.     BP (!) 166/80   Pulse 71   Resp 18   Wt 225 lb (102.1 kg)   BMI 36.32 kg/m   Past Medical History:  Diagnosis Date   Anemia    H/O   Anemia in chronic kidney disease    Anxiety    Arthritis    Atrial flutter (HCC)    a. s/p post ablation in 04/2017   CHF (congestive heart failure) (HCC)    Chronic heart failure with preserved ejection fraction (  HFpEF) (HCC)    a. 03/2022 Echo: EF 60-65%, no rwma, GrI DD, nl RV fxn; b. 02/2024 Echo: EF 60 to 65% with Gr1 DD, nl RV function, and mild MR.   CKD (chronic kidney disease), stage IV (HCC)    Complication of anesthesia    CTCL (cutaneous T-cell lymphoma) (HCC)    Depression    Diabetes mellitus without complication (HCC)    Dysplastic nevus 12/19/2017   Right distal lat. forearm near wrist. Severe atypia, close to peripheral margin.   Dysplastic nevus 06/21/2018   Upper back right paraspinal. Severe atypia, peripheral margin involved. Excised 07/11/2018, margins free.   Family history of adverse reaction to anesthesia    PT WAS ADOPTED   HLD (hyperlipidemia)    HTN (hypertension)    Hx of dysplastic nevus 2019   multiple sites   Hx of squamous cell carcinoma 01/18/2018   R mid lateral forearm   MRSA (methicillin resistant Staphylococcus aureus)    after back surgery   OSA (obstructive sleep apnea)    does not use bipap   Oxygen dependent    Polio    POLIOMYELITIS 01/12/2010    Right arm affected   PONV (postoperative nausea and vomiting)    Squamous cell carcinoma of skin 12/19/2017   Right mid lat. forearm. SCCis, hypertrophic.    Social History   Socioeconomic History   Marital status: Married    Spouse name: Not on file   Number of children: 0   Years of education: Not on file   Highest education level: Not on file  Occupational History   Occupation: Corporate investment banker    Comment: 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   Occupation: Engineering geologist    Comment: Retired   Occupation: Scientist, research (medical)    Comment: Retired  Tobacco Use   Smoking status: Former    Types: Cigars    Quit date: 09/23/1990    Years since quitting: 33.6    Passive exposure: Past (as a child)   Smokeless tobacco: Former    Quit date: 02/25/2006  Vaping Use   Vaping status: Never Used  Substance and Sexual Activity   Alcohol use: No    Alcohol/week: 0.0 standard drinks of alcohol   Drug use: No   Sexual activity: Yes    Partners: Female  Other Topics Concern   Not on file  Social History Narrative   Has living will   Wife is health care POA---then brother or sister   Would accept resuscitation attempts but no prolonged ventilation or tube feeds   Social Drivers of Health   Financial Resource Strain: Low Risk  (03/22/2024)   Overall Financial Resource Strain (CARDIA)    Difficulty of Paying Living Expenses: Not very hard  Food Insecurity: No Food Insecurity (04/13/2024)   Hunger Vital Sign    Worried About Running Out of Food in the Last Year: Never true    Ran Out of Food in the Last Year: Never true  Transportation Needs: No Transportation Needs (04/13/2024)   PRAPARE - Administrator, Civil Service (Medical): No    Lack of Transportation (Non-Medical): No  Physical Activity: Inactive (03/22/2024)   Exercise Vital Sign    Days of Exercise per Week: 0 days    Minutes of Exercise per Session: 0 min  Stress: No Stress Concern Present  (03/22/2024)   [employer] of Occupational Health - Occupational Stress Questionnaire    Feeling of Stress: Only a little  Social Connections: Socially  Isolated (03/22/2024)   Social Connection and Isolation Panel    Frequency of Communication with Friends and Family: Never    Frequency of Social Gatherings with Friends and Family: Never    Attends Religious Services: Never    Database administrator or Organizations: No    Attends Banker Meetings: Never    Marital Status: Married  Catering manager Violence: Not At Risk (04/13/2024)   Humiliation, Afraid, Rape, and Kick questionnaire    Fear of Current or Ex-Partner: No    Emotionally Abused: No    Physically Abused: No    Sexually Abused: No    Past Surgical History:  Procedure Laterality Date   BACK SURGERY     LUMBAR   CARDIAC ELECTROPHYSIOLOGY STUDY AND ABLATION  2019   CATARACT EXTRACTION W/PHACO Right 05/05/2022   Procedure: CATARACT EXTRACTION PHACO AND INTRAOCULAR LENS PLACEMENT (IOC) RIGHT;  Surgeon: Mittie Gaskin, MD;  Location[facility];  Service: Ophthalmology;  Laterality: Right;  Diabetic 10.42 01:13.4   COLONOSCOPY WITH PROPOFOL  N/A 04/04/2019   Procedure: COLONOSCOPY WITH PROPOFOL ;  Surgeon: Toledo, Ladell POUR, MD;  Location: [facility];  Service: Gastroenterology;  Laterality: N/A;   ESOPHAGOGASTRODUODENOSCOPY N/A 11/28/2023   Procedure: EGD (ESOPHAGOGASTRODUODENOSCOPY);  Surgeon: Jinny Carmine, MD;  Location: [facility];  Service: Endoscopy;  Laterality: N/A;   I & D EXTREMITY Right 02/01/2020   Procedure: IRRIGATION AND DEBRIDEMENT EXTREMITY with poly exchange;  Surgeon: Mardee Lynwood SQUIBB, MD;  Location: [facility];  Service: Orthopedics;  Laterality: Right;   INCISION AND DRAINAGE     BACK-MRSA INFECTION AFTER BACK SURGERY   KNEE ARTHROPLASTY Right 01/28/2020   Procedure: COMPUTER ASSISTED TOTAL KNEE ARTHROPLASTY;  Surgeon: Mardee Lynwood SQUIBB, MD;  Location: [facility];  Service:  Orthopedics;  Laterality: Right;   MOUTH SURGERY     right elbow surgery     right knee surgery     right shoulder surgery     from polio damage   TONSILLECTOMY     TOTAL HIP ARTHROPLASTY Bilateral 04/2016    Family History  Adopted: Yes    Allergies  Allergen Reactions   Bee Venom Anaphylaxis   Beta Adrenergic Blockers Other (See Comments)    Symptomatic bradycardia   Oxycodone  Other (See Comments)    Delusions   Hydromorphone  Other (See Comments)    hallucinating   Hydroxychloroquine     Other reaction(s): Other (See Comments) He broke out really badly.   Zolpidem Other (See Comments)       Latest Ref Rng & Units 04/03/2024    1:15 PM 03/12/2024    2:27 PM 03/06/2024    1:42 PM  CBC  WBC 4.0 - 10.5 K/uL 5.6  6.3    Hemoglobin 13.0 - 17.0 g/dL 88.8  88.6  88.6   Hematocrit 39.0 - 52.0 % 34.1  34.7  34.0   Platelets 150 - 400 K/uL 167  178.0        CMP     Component Value Date/Time   NA 137 03/12/2024 1427   NA 141 10/28/2011 0325   K 4.6 03/12/2024 1427   K 3.9 10/28/2011 0325   CL 107 03/12/2024 1427   CL 106 10/28/2011 0325   CO2 24 03/12/2024 1427   CO2 25 10/28/2011 0325   GLUCOSE 122 (H) 03/12/2024 1427   GLUCOSE 121 (H) 10/28/2011 0325   BUN 45 (H) 03/12/2024 1427   BUN 22 (H) 10/28/2011 0325   CREATININE 3.30 (H) 03/12/2024 1427  CREATININE 3.87 (H) 03/06/2024 1342   CREATININE 1.27 (H) 02/09/2016 1421   CALCIUM  10.1 03/12/2024 1427   CALCIUM  7.9 (L) 10/28/2011 0325   PROT 6.5 02/28/2024 1820   ALBUMIN 3.9 03/12/2024 1427   AST 22 02/28/2024 1820   ALT 16 02/28/2024 1820   ALKPHOS 51 02/28/2024 1820   BILITOT 0.5 02/28/2024 1820   GFR 17.08 (L) 03/12/2024 1427   GFRNONAA 15 (L) 03/06/2024 1342   GFRNONAA 56 (L) 02/09/2016 1421     No results found.     Assessment & Plan:   1. Chronic venous insufficiency (Primary) Patient has been in continuous Unna boots for the last 10+ weeks.  Today he presents to clinic with well-controlled  bilateral lower extremity swelling.  He has got history of skin cancer which he uses a light box.  He has multiple scabs due to the skin cancer all over his lower extremities and therefore would like to return to using compression socks for his lower extremities now that the swelling has resolved.  I agree with this plan at this time.  I encouraged patient to use his light box on his lower extremities to help control his skin cancer and hopefully reduce the scabbing to his lower extremities.  He has followed up with wound care and they have him currently on amoxicillin  for all the scabbing area.  I recommend that he continue to do that and finish the antibiotic course he was given.  Patient will follow-up with me in 3 months for continued surveillance of his bilateral lower extremity vascular insufficiency and swelling.  2. Essential hypertension Continue antihypertensive medications as already ordered, these medications have been reviewed and there are no changes at this time.  3. Type 2 diabetes mellitus with diabetic nephropathy, without long-term current use of insulin  (HCC) Continue hypoglycemic medications as already ordered, these medications have been reviewed and there are no changes at this time.  Hgb A1C to be monitored as already arranged by primary service  4. Hypercholesterolemia Continue statin as ordered and reviewed, no changes at this time  5. Morbid obesity (HCC) I had a 10 minute conversation with the patient discussing the need to lose weight as this effects the swelling to his lower extremities as well as his overall health.    Current Outpatient Medications on File Prior to Visit  Medication Sig Dispense Refill   amLODipine  (NORVASC ) 10 MG tablet Take 10 mg by mouth daily.     calcitRIOL  (ROCALTROL ) 0.25 MCG capsule Take 0.5 mcg by mouth daily. (Patient not taking: Reported on 05/10/2024)     Cholecalciferol  (VITAMIN D -3) 25 MCG (1000 UT) CAPS Take 1,000 Units by mouth  daily.     clorazepate  (TRANXENE ) 7.5 MG tablet TAKE 1 TABLET BY MOUTH TWICE A DAY AS NEEDED FOR ANXIETY 60 tablet 0   EPINEPHrine  0.3 mg/0.3 mL IJ SOAJ injection Inject 0.3 mg into the muscle as needed for anaphylaxis. 1 each 5   Ferrous Sulfate  (IRON PO) Take 1 tablet by mouth every morning.     folic acid  (FOLVITE ) 1 MG tablet Take 1 mg by mouth daily.     furosemide  (LASIX ) 40 MG tablet Take 40 mg by mouth daily as needed (for weight gain of 3# or more).     glucose blood (ONETOUCH VERIO) test strip Use to check blood sugar once daily 100 each 12   hydrocortisone  2.5 % cream Apply topically 3 (three) times daily as needed. 56 g 3   isosorbide  mononitrate (  IMDUR ) 60 MG 24 hr tablet Take 1 tablet (60 mg total) by mouth daily before lunch. 90 tablet 3   latanoprost  (XALATAN ) 0.005 % ophthalmic solution Place 1 drop into both eyes at bedtime.     MAGNESIUM  PO Take 1 tablet by mouth daily.     melatonin 5 MG TABS Take 1 tablet (5 mg total) by mouth at bedtime as needed. 30 tablet 0   methotrexate  (RHEUMATREX) 5 MG tablet Take by mouth. (Patient taking differently: Take by mouth. Patient reports 2 tablets weekly)     rosuvastatin  (CRESTOR ) 10 MG tablet TAKE ONE TABLET BY MOUTH EVERY DAY 90 tablet 3   No current facility-administered medications on file prior to visit.    There are no Patient Instructions on file for this visit. No follow-ups on file.   Gwendlyn JONELLE Shank, NP

## 2024-05-14 NOTE — Telephone Encounter (Signed)
 Called and spoke to [place]. She said he has decided he wants to continue with nursing. Will need the recert forms faxed back.

## 2024-05-15 NOTE — Discharge Instructions (Signed)

## 2024-05-16 ENCOUNTER — Ambulatory Visit
Admission: RE | Admit: 2024-05-16 | Discharge: 2024-05-16 | Disposition: A | Discharge status: Home / Self Care | Attending: Ophthalmology | Admitting: Ophthalmology

## 2024-05-16 ENCOUNTER — Ambulatory Visit: Payer: Self-pay | Admitting: Anesthesiology

## 2024-05-16 ENCOUNTER — Encounter
Admission: RE | Disposition: A | Discharge status: Home / Self Care | Payer: Self-pay | Source: Home / Self Care | Attending: Ophthalmology

## 2024-05-16 ENCOUNTER — Telehealth: Payer: Self-pay

## 2024-05-16 ENCOUNTER — Encounter: Payer: Self-pay | Admitting: Ophthalmology

## 2024-05-16 ENCOUNTER — Other Ambulatory Visit: Payer: Self-pay

## 2024-05-16 DIAGNOSIS — I4892 Unspecified atrial flutter: Secondary | ICD-10-CM | POA: Diagnosis not present

## 2024-05-16 DIAGNOSIS — G4733 Obstructive sleep apnea (adult) (pediatric): Secondary | ICD-10-CM | POA: Insufficient documentation

## 2024-05-16 DIAGNOSIS — H2512 Age-related nuclear cataract, left eye: Secondary | ICD-10-CM | POA: Insufficient documentation

## 2024-05-16 DIAGNOSIS — N184 Chronic kidney disease, stage 4 (severe): Secondary | ICD-10-CM | POA: Insufficient documentation

## 2024-05-16 DIAGNOSIS — D631 Anemia in chronic kidney disease: Secondary | ICD-10-CM | POA: Insufficient documentation

## 2024-05-16 DIAGNOSIS — I5032 Chronic diastolic (congestive) heart failure: Secondary | ICD-10-CM | POA: Insufficient documentation

## 2024-05-16 DIAGNOSIS — Z9981 Dependence on supplemental oxygen: Secondary | ICD-10-CM | POA: Diagnosis not present

## 2024-05-16 DIAGNOSIS — E1122 Type 2 diabetes mellitus with diabetic chronic kidney disease: Secondary | ICD-10-CM | POA: Diagnosis not present

## 2024-05-16 DIAGNOSIS — Z87891 Personal history of nicotine dependence: Secondary | ICD-10-CM | POA: Insufficient documentation

## 2024-05-16 DIAGNOSIS — E1136 Type 2 diabetes mellitus with diabetic cataract: Secondary | ICD-10-CM | POA: Insufficient documentation

## 2024-05-16 DIAGNOSIS — I130 Hypertensive heart and chronic kidney disease with heart failure and stage 1 through stage 4 chronic kidney disease, or unspecified chronic kidney disease: Secondary | ICD-10-CM | POA: Diagnosis not present

## 2024-05-16 HISTORY — DX: Heart failure, unspecified: I50.9

## 2024-05-16 HISTORY — DX: Dependence on supplemental oxygen: Z99.81

## 2024-05-16 HISTORY — DX: Depression, unspecified: F32.A

## 2024-05-16 HISTORY — DX: Unspecified hearing loss, unspecified ear: H91.90

## 2024-05-16 HISTORY — DX: Complete loss of teeth, unspecified cause, unspecified class: K08.109

## 2024-05-16 HISTORY — DX: Presence of dental prosthetic device (complete) (partial): Z97.2

## 2024-05-16 HISTORY — DX: Anemia in chronic kidney disease: D63.1

## 2024-05-16 HISTORY — PX: CATARACT EXTRACTION W/PHACO: SHX586

## 2024-05-16 SURGERY — PHACOEMULSIFICATION, CATARACT, WITH IOL INSERTION
Anesthesia: Topical | Laterality: Left

## 2024-05-16 MED ORDER — ARMC OPHTHALMIC DILATING DROPS
OPHTHALMIC | Status: AC
  Filled 2024-05-16: qty 0.5

## 2024-05-16 MED ORDER — SIGHTPATH DOSE#1 NA HYALUR & NA CHOND-NA HYALUR IO KIT
PACK | INTRAOCULAR | Status: DC | PRN
  Administered 2024-05-16: 1 via OPHTHALMIC

## 2024-05-16 MED ORDER — LIDOCAINE HCL (PF) 2 % IJ SOLN
INTRAOCULAR | Status: DC | PRN
  Administered 2024-05-16: 1 mL

## 2024-05-16 MED ORDER — BRIMONIDINE TARTRATE-TIMOLOL 0.2-0.5 % OP SOLN
OPHTHALMIC | Status: DC | PRN
  Administered 2024-05-16: 1 [drp] via OPHTHALMIC

## 2024-05-16 MED ORDER — MOXIFLOXACIN HCL 0.5 % OP SOLN: OPHTHALMIC | Status: DC | PRN

## 2024-05-16 MED ORDER — SIGHTPATH DOSE#1 BSS IO SOLN
INTRAOCULAR | Status: DC | PRN
  Administered 2024-05-16: 83 mL via OPHTHALMIC

## 2024-05-16 MED ORDER — ARMC OPHTHALMIC DILATING DROPS
1.0000 | OPHTHALMIC | Status: DC | PRN
  Administered 2024-05-16 (×3): 1 via OPHTHALMIC

## 2024-05-16 MED ORDER — FENTANYL CITRATE (PF) 100 MCG/2ML IJ SOLN
INTRAMUSCULAR | Status: DC | PRN
  Administered 2024-05-16: 50 ug via INTRAVENOUS

## 2024-05-16 MED ORDER — TETRACAINE HCL 0.5 % OP SOLN
OPHTHALMIC | Status: AC
  Filled 2024-05-16: qty 4

## 2024-05-16 MED ORDER — LACTATED RINGERS IV SOLN: INTRAVENOUS | Status: DC

## 2024-05-16 MED ORDER — SIGHTPATH DOSE#1 BSS IO SOLN
INTRAOCULAR | Status: DC | PRN
  Administered 2024-05-16: 15 mL via INTRAOCULAR

## 2024-05-16 MED ORDER — FENTANYL CITRATE (PF) 100 MCG/2ML IJ SOLN
INTRAMUSCULAR | Status: AC
  Filled 2024-05-16: qty 2

## 2024-05-16 MED ORDER — TETRACAINE HCL 0.5 % OP SOLN
1.0000 [drp] | OPHTHALMIC | Status: DC | PRN
  Administered 2024-05-16 (×3): 1 [drp] via OPHTHALMIC

## 2024-05-16 MED ORDER — CEFUROXIME OPHTHALMIC INJECTION 1 MG/0.1 ML
INJECTION | OPHTHALMIC | Status: DC | PRN
  Administered 2024-05-16: .1 mL via INTRACAMERAL

## 2024-05-16 MED ORDER — MIDAZOLAM HCL 2 MG/2ML IJ SOLN
INTRAMUSCULAR | Status: DC | PRN
  Administered 2024-05-16 (×2): 1 mg via INTRAVENOUS

## 2024-05-16 MED ORDER — MIDAZOLAM HCL 2 MG/2ML IJ SOLN
INTRAMUSCULAR | Status: AC
  Filled 2024-05-16: qty 2

## 2024-05-16 SURGICAL SUPPLY — 15 items
CANNULA ANT/CHMB 27G (MISCELLANEOUS) IMPLANT
FEE CATARACT SUITE SIGHTPATH (MISCELLANEOUS) ×1 IMPLANT
GLOVE BIOGEL PI IND STRL 8 (GLOVE) ×1 IMPLANT
GLOVE PI ULTRA LF STRL 7.5 (GLOVE) IMPLANT
GLOVE SURG LX STRL 7.5 STRW (GLOVE) ×1 IMPLANT
GLOVE SURG SYN 6.5 PF PI BL (GLOVE) ×1 IMPLANT
LENS IOL TECNIS EYHANCE 14.5 (Intraocular Lens) IMPLANT
NDL FILTER BLUNT 18X1 1/2 (NEEDLE) ×1 IMPLANT
NDL RETROBULBAR .5 NSTRL (NEEDLE) IMPLANT
NEEDLE FILTER BLUNT 18X1 1/2 (NEEDLE) ×1 IMPLANT
PACK VIT ANT 23G (MISCELLANEOUS) IMPLANT
RING MALYGIN 7.0 (MISCELLANEOUS) IMPLANT
SUT VICRYL 9 0 (SUTURE) IMPLANT
SUTURE EHLN 10-0 CS-B-6CS-B-6 (SUTURE) IMPLANT
SYR 3ML LL SCALE MARK (SYRINGE) ×1 IMPLANT

## 2024-05-16 NOTE — Telephone Encounter (Signed)
 Copied from CRM [id number]. Topic: Clinical - Medical Advice >> May 16, 2024  2:03 PM Taleah C wrote: Reason for CRM: Star from [facility] called and stated they received a referral for palliative care from the nephrologist. She stated that they need pcp to notate in his chart that he is now being followed by palliative care. The callback number is [phone].

## 2024-05-16 NOTE — Op Note (Signed)
 OPERATIVE NOTE  Daniel Kline [id number] 05/16/2024   PREOPERATIVE DIAGNOSIS:  Nuclear sclerotic cataract left eye. H25.12   POSTOPERATIVE DIAGNOSIS:    Nuclear sclerotic cataract left eye.     PROCEDURE:  Phacoemusification with posterior chamber intraocular lens placement of the left eye  Ultrasound time: Procedure(s): PHACOEMULSIFICATION, CATARACT, WITH IOL INSERTION 17.44, 01:37.6 (Left)  LENS:   Implant Name Type Inv. Item Serial No. Manufacturer Lot No. LRB No. Used Action  LENS IOL TECNIS EYHANCE 14.5 - [id number] Intraocular Lens LENS IOL TECNIS EYHANCE 14.5 [id number] [employer]  Left 1 Implanted      SURGEON:  Dene FABIENE Etienne, MD   ANESTHESIA:  Topical with tetracaine  drops and 2% Xylocaine  jelly, augmented with 1% preservative-free intracameral lidocaine .    COMPLICATIONS:  None.   DESCRIPTION OF PROCEDURE:  The patient was identified in the holding room and transported to the operating room and placed in the supine position under the operating microscope.  The left eye was identified as the operative eye and it was prepped and draped in the usual sterile ophthalmic fashion.   A 1 millimeter clear-corneal paracentesis was made at the 1:30 position.  0.5 ml of preservative-free 1% lidocaine  was injected into the anterior chamber.  The anterior chamber was filled with Viscoat viscoelastic.  A 2.4 millimeter keratome was used to make a near-clear corneal incision at the 10:30 position.  .  A curvilinear capsulorrhexis was made with a cystotome and capsulorrhexis forceps.  Balanced salt solution was used to hydrodissect and hydrodelineate the nucleus.   Phacoemulsification was then used in stop and chop fashion to remove the lens nucleus and epinucleus.  The remaining cortex was then removed using the irrigation and aspiration handpiece. Provisc was then placed into the capsular bag to distend it for lens placement.  A lens was then injected into the capsular bag.  The  remaining viscoelastic was aspirated.   Wounds were hydrated with balanced salt solution.  The anterior chamber was inflated to a physiologic pressure with balanced salt solution.  No wound leaks were noted. Cefuroxime  0.1 ml of a 10mg /ml solution was injected into the anterior chamber for a dose of 1 mg of intracameral antibiotic at the completion of the case.   Timolol  and Brimonidine  drops were applied to the eye.  The patient was taken to the recovery room in stable condition without complications of anesthesia or surgery.  [name] 05/16/2024, 9:14 AM

## 2024-05-16 NOTE — Anesthesia Postprocedure Evaluation (Signed)
 Anesthesia Post Note  Patient: Daniel Kline  Procedure(s) Performed: PHACOEMULSIFICATION, CATARACT, WITH IOL INSERTION 17.44, 01:37.6 (Left)  Patient location during evaluation: PACU Anesthesia Type: MAC Level of consciousness: awake and alert Pain management: pain level controlled Vital Signs Assessment: post-procedure vital signs reviewed and stable Respiratory status: spontaneous breathing, nonlabored ventilation, respiratory function stable and patient connected to nasal cannula oxygen Cardiovascular status: stable and blood pressure returned to baseline Postop Assessment: no apparent nausea or vomiting Anesthetic complications: no   No notable events documented.   Last Vitals:  Vitals:   05/16/24 0916 05/16/24 0920  BP: (!) 154/83 (!) 155/80  Pulse: 61 (!) 59  Resp: 18 15  Temp: 36.4 C (!) 36.4 C  SpO2: 98% 96%    Last Pain:  Vitals:   05/16/24 0920  TempSrc:   PainSc: 0-No pain                 Tel Hevia C Priyanka Causey

## 2024-05-16 NOTE — H&P (Signed)
 [facility]   Primary Care Physician:  [name], MD Ophthalmologist: Dr. Dene Etienne  Pre-Procedure History & Physical: HPI:  Daniel Kline is a 78 y.o. male here for ophthalmic surgery.   Past Medical History:  Diagnosis Date   Anemia    H/O   Anemia in chronic kidney disease    Anxiety    Arthritis    Atrial flutter (HCC)    a. 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 03/2022 Echo: EF 60-65%, no rwma, GrI DD, nl RV fxn; b. 02/2024 Echo: EF 60 to 65% with Gr1 DD, nl RV function, and mild MR.   CKD (chronic kidney disease), stage IV (HCC)    Complication of anesthesia    CTCL (cutaneous T-cell lymphoma) (HCC)    Depression    Diabetes mellitus without complication (HCC)    Dysplastic nevus 12/19/2017   Right distal lat. forearm near wrist. Severe atypia, close to peripheral margin.   Dysplastic nevus 06/21/2018   Upper back right paraspinal. Severe atypia, peripheral margin involved. Excised 07/11/2018, margins free.   Family history of adverse reaction to anesthesia    PT WAS ADOPTED   HLD (hyperlipidemia)    HTN (hypertension)    Hx of dysplastic nevus 2019   multiple sites   Hx of squamous cell carcinoma 01/18/2018   R mid lateral forearm   MRSA (methicillin resistant Staphylococcus aureus)    after back surgery   OSA (obstructive sleep apnea)    does not use bipap   Oxygen dependent    Polio    POLIOMYELITIS 01/12/2010   Right arm affected   PONV (postoperative nausea and vomiting)    Squamous cell carcinoma of skin 12/19/2017   Right mid lat. forearm. SCCis, hypertrophic.    Past Surgical History:  Procedure Laterality Date   BACK SURGERY     LUMBAR   CARDIAC ELECTROPHYSIOLOGY STUDY AND ABLATION  2019   CATARACT EXTRACTION W/PHACO Right 05/05/2022   Procedure: CATARACT EXTRACTION PHACO AND INTRAOCULAR LENS PLACEMENT (IOC) RIGHT;   Surgeon: Etienne Dene, MD;  Location: [facility];  Service: Ophthalmology;  Laterality: Right;  Diabetic 10.42 01:13.4   COLONOSCOPY WITH PROPOFOL  N/A 04/04/2019   Procedure: COLONOSCOPY WITH PROPOFOL ;  Surgeon: Toledo, Ladell POUR, MD;  Location: [facility];  Service: Gastroenterology;  Laterality: N/A;   ESOPHAGOGASTRODUODENOSCOPY N/A 11/28/2023   Procedure: EGD (ESOPHAGOGASTRODUODENOSCOPY);  Surgeon: Jinny Carmine, MD;  Location: [facility];  Service: Endoscopy;  Laterality: N/A;   I & D EXTREMITY Right 02/01/2020   Procedure: IRRIGATION AND DEBRIDEMENT EXTREMITY with poly exchange;  Surgeon: Mardee Lynwood SQUIBB, MD;  Location: [facility];  Service: Orthopedics;  Laterality: Right;   INCISION AND DRAINAGE     BACK-MRSA INFECTION AFTER BACK SURGERY   KNEE ARTHROPLASTY Right 01/28/2020   Procedure: COMPUTER ASSISTED TOTAL KNEE ARTHROPLASTY;  Surgeon: Mardee Lynwood SQUIBB, MD;  Location: [facility];  Service: Orthopedics;  Laterality: Right;   MOUTH SURGERY     right elbow surgery     right knee surgery     right shoulder surgery     from polio damage   TONSILLECTOMY     TOTAL HIP ARTHROPLASTY Bilateral 04/2016    Prior to Admission medications   Medication Sig Start Date End Date Taking? Authorizing Provider  amLODipine  (NORVASC ) 10 MG tablet Take 10 mg by mouth daily. 02/21/24  Yes [provider]  Cholecalciferol  (VITAMIN D -3) 25 MCG (1000 UT) CAPS Take 1,000 Units by mouth daily.   Yes [provider]  clorazepate  (TRANXENE ) 7.5 MG tablet TAKE 1 TABLET BY MOUTH TWICE A DAY AS NEEDED FOR ANXIETY 05/01/24  Yes Webb, Padonda B, FNP  Ferrous Sulfate  (IRON PO) Take 1 tablet by mouth every morning.   Yes [provider]  folic acid  (FOLVITE ) 1 MG tablet Take 1 mg by mouth daily. 12/19/23  Yes [provider]  furosemide  (LASIX ) 40 MG tablet Take 40 mg by mouth daily as needed (for weight gain of 3# or more).   Yes [provider]  isosorbide   mononitrate (IMDUR ) 60 MG 24 hr tablet Take 1 tablet (60 mg total) by mouth daily before lunch. 05/11/21  Yes Gollan, Timothy J, MD  latanoprost  (XALATAN ) 0.005 % ophthalmic solution Place 1 drop into both eyes at bedtime.   Yes [provider]  MAGNESIUM  PO Take 1 tablet by mouth daily.   Yes [provider]  melatonin 5 MG TABS Take 1 tablet (5 mg total) by mouth at bedtime as needed. 12/31/23  Yes Wieting, Richard, MD  methotrexate  (RHEUMATREX) 5 MG tablet Take by mouth. Patient taking differently: Take by mouth. Patient reports 2 tablets weekly   Yes [provider]  rosuvastatin  (CRESTOR ) 10 MG tablet TAKE ONE TABLET BY MOUTH EVERY DAY 01/04/24  Yes [name], MD  calcitRIOL  (ROCALTROL ) 0.25 MCG capsule Take 0.5 mcg by mouth daily. Patient not taking: Reported on 05/10/2024 06/15/23 06/14/24  [provider]  EPINEPHrine  0.3 mg/0.3 mL IJ SOAJ injection Inject 0.3 mg into the muscle as needed for anaphylaxis. 01/27/23   Jimmy Charlie I, MD  glucose blood (ONETOUCH VERIO) test strip Use to check blood sugar once daily 09/27/23   Jimmy Charlie I, MD  hydrocortisone  2.5 % cream Apply topically 3 (three) times daily as needed. 06/17/23   [name], MD    Allergies as of 04/18/2024 - Review Complete 04/13/2024  Allergen Reaction Noted   Bee venom Anaphylaxis 06/22/2018   Beta adrenergic blockers Other (See Comments) 03/01/2024   Oxycodone  Other (See Comments) 12/20/2011   Hydromorphone  Other (See Comments) 02/03/2017   Hydroxychloroquine  08/31/2021   Zolpidem Other (See Comments) 12/08/2011    Family History  Adopted: Yes    Social History   Socioeconomic History   Marital status: Married    Spouse name: Not on file   Number of children: 0   Years of education: Not on file   Highest education level: Not on file  Occupational History   Occupation: Corporate investment banker    Comment: 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   Occupation: Artist    Comment: Retired   Occupation: Scientist, research (medical)    Comment: Retired  Tobacco Use   Smoking status: Former    Types: Cigars    Quit date: 09/23/1990    Years since quitting: 33.6    Passive exposure: Past (as a child)   Smokeless tobacco: Former    Quit date: 02/25/2006  Vaping Use   Vaping status: Never Used  Substance and Sexual Activity   Alcohol use: No    Alcohol/week: 0.0 standard drinks of alcohol   Drug use: No   Sexual activity: Yes    Partners: Female  Other Topics Concern   Not on file  Social History Narrative   Has living will   Wife is health care POA---then brother or sister  Would accept resuscitation attempts but no prolonged ventilation or tube feeds   Social Drivers of Health   Financial Resource Strain: Low Risk  (03/22/2024)   Overall Financial Resource Strain (CARDIA)    Difficulty of Paying Living Expenses: Not very hard  Food Insecurity: No Food Insecurity (04/13/2024)   Hunger Vital Sign    Worried About Running Out of Food in the Last Year: Never true    Ran Out of Food in the Last Year: Never true  Transportation Needs: No Transportation Needs (04/13/2024)   PRAPARE - Administrator, Civil Service (Medical): No    Lack of Transportation (Non-Medical): No  Physical Activity: Inactive (03/22/2024)   Exercise Vital Sign    Days of Exercise per Week: 0 days    Minutes of Exercise per Session: 0 min  Stress: No Stress Concern Present (03/22/2024)   [employer] of Occupational Health - Occupational Stress Questionnaire    Feeling of Stress: Only a little  Social Connections: Socially Isolated (03/22/2024)   Social Connection and Isolation Panel    Frequency of Communication with Friends and Family: Never    Frequency of Social Gatherings with Friends and Family: Never    Attends Religious Services: Never    Database administrator or Organizations: No    Attends Banker Meetings: Never     Marital Status: Married  Catering manager Violence: Not At Risk (04/13/2024)   Humiliation, Afraid, Rape, and Kick questionnaire    Fear of Current or Ex-Partner: No    Emotionally Abused: No    Physically Abused: No    Sexually Abused: No    Review of Systems: See HPI, otherwise negative ROS  Physical Exam: BP (!) 199/82   Pulse 70   Temp 98.5 F (36.9 C) (Temporal)   Resp 15   Ht 5' 6 (1.676 m)   Wt 100.2 kg   SpO2 99%   BMI 35.67 kg/m  General:   Alert,  pleasant and cooperative in NAD Head:  Normocephalic and atraumatic. Lungs:  Clear to auscultation.    Heart:  Regular rate and rhythm.   Impression/Plan: Daniel Kline is here for ophthalmic surgery.  Risks, benefits, limitations, and alternatives regarding ophthalmic surgery have been reviewed with the patient.  Questions have been answered.  All parties agreeable.   MITTIE GASKIN, MD  05/16/2024, 8:26 AM

## 2024-05-16 NOTE — Telephone Encounter (Signed)
 Called back and asked exactly what they needed from us  as he has not done his TOC. She said it just needs to be noted in his chart that he is having palliative care. That is all. Nothing is needed from us .

## 2024-05-16 NOTE — Transfer of Care (Signed)
 Immediate Anesthesia Transfer of Care Note  Patient: Daniel Kline  Procedure(s) Performed: PHACOEMULSIFICATION, CATARACT, WITH IOL INSERTION (Left)  Patient Location: PACU  Anesthesia Type: No value filed.  Level of Consciousness: awake, alert  and patient cooperative  Airway and Oxygen Therapy: Patient Spontanous Breathing and Patient connected to supplemental oxygen  Post-op Assessment: Post-op Vital signs reviewed, Patient's Cardiovascular Status Stable, Respiratory Function Stable, Patent Airway and No signs of Nausea or vomiting  Post-op Vital Signs: Reviewed and stable  Complications: No notable events documented.

## 2024-05-17 ENCOUNTER — Encounter: Payer: Self-pay | Admitting: Ophthalmology

## 2024-05-17 ENCOUNTER — Other Ambulatory Visit: Payer: Self-pay

## 2024-05-17 NOTE — Telephone Encounter (Signed)
 Received fax for glipizide . Faxed back that pt is not currently on medication.

## 2024-05-18 ENCOUNTER — Telehealth: Payer: Self-pay

## 2024-05-18 NOTE — Telephone Encounter (Unsigned)
 Copied from CRM [id number]. Topic: Clinical - Medication Refill >> May 18, 2024  8:46 AM [name] wrote: Medication: hydrALAZINE  (APRESOLINE ) 25 MG tablet  Has the patient contacted their pharmacy? Yes (Agent: If no, request that the patient contact the pharmacy for the refill. If patient does not wish to contact the pharmacy document the reason why and proceed with request.) (Agent: If yes, when and what did the pharmacy advise?)  This is the patient's preferred pharmacy:  [facility] - [place], [place] - [address] [place] [address] Phone: [phone] Fax: [phone]  [facility]) - [place], [place] - [address] [facility] [address] [place] [address] [place] [address] Phone: [phone] Fax: [phone]  [id number] [place], [place] - [address] [place] [address] Phone: [phone] Fax: [phone]  [facility] - [place], [place] - [address] [place] [address] Phone: [phone] Fax: [phone]  Is this the correct pharmacy for this prescription? Yes If no, delete pharmacy and type the correct one.   Has the prescription been filled recently? Yes  Is the patient out of the medication? Yes  Has the patient been seen for an appointment in the last year OR does the patient have an upcoming appointment? Yes  Can we respond through MyChart? Yes  Agent: Please be advised that Rx refills may take up to 3 business days. We ask that you follow-up with your pharmacy.

## 2024-05-18 NOTE — Telephone Encounter (Signed)
 Hydroxyzine  25 mg was removed from his med list in April by hospitalist. Pt states he only takes it when he is itching. Has had a bottle of medication for awhile and had taken it all.

## 2024-05-18 NOTE — Telephone Encounter (Signed)
Not on current med list, unable to pend.

## 2024-05-21 ENCOUNTER — Other Ambulatory Visit: Payer: Self-pay | Admitting: Family

## 2024-05-21 MED ORDER — HYDROXYZINE PAMOATE 25 MG PO CAPS: 25.0000 mg | ORAL_CAPSULE | Freq: Three times a day (TID) | ORAL | 0 refills | Status: DC | PRN

## 2024-05-21 NOTE — Telephone Encounter (Signed)
 He wants hydroxyzine  for itching. I made sure to ask if that was correct before sending it for a request.

## 2024-05-25 ENCOUNTER — Encounter

## 2024-05-29 DIAGNOSIS — C8400 Mycosis fungoides, unspecified site: Secondary | ICD-10-CM | POA: Diagnosis not present

## 2024-05-29 DIAGNOSIS — C84A0 Cutaneous T-cell lymphoma, unspecified, unspecified site: Secondary | ICD-10-CM | POA: Diagnosis not present

## 2024-05-30 ENCOUNTER — Other Ambulatory Visit: Payer: Self-pay

## 2024-05-30 NOTE — Patient Instructions (Signed)
 Visit Information  Thank you for taking time to visit with me today. Please don't hesitate to contact me if I can be of assistance to you before our next scheduled appointment.  Your next care management appointment is by telephone on 06/20/1024 at 2:00 pm  Telephone follow-up in 1 month  Please call the care guide team at [phone] if you need to cancel, schedule, or reschedule an appointment.   Please call the Suicide and Crisis Lifeline: 988 call the [place]  National Suicide Prevention Lifeline: 228-181-0971 or TTY: (920) 585-0235 TTY 587-039-3597) to talk to a trained counselor call 1-800-273-TALK (toll free, 24 hour hotline) go to [facility] [address] [place] [phone]) call 911 if you are experiencing a Mental Health or Behavioral Health Crisis or need someone to talk to.  [name], MSN, RN [place]  [facility], [facility] RN Care Manager Direct Dial: [phone] Fax: [phone]  Palliative Care Palliative care can help make your quality of life better if you have a very serious illness. It involves care of your body, mind, and spirit. It's based on what you need and want in this stage of life. In many cases, care may take place in a hospital or long-term care setting. It may include: Help with pain and other symptoms. Family support. Spiritual support. Emotional support. Social support. Palliative care can help bring you comfort and peace of mind. It can be helpful to you and your family during the course of an illness. What is the difference between palliative care and hospice? Palliative care and hospice have similar goals. They both aim to: Help with symptoms. Provide comfort. Make your quality of life better. Maintain your dignity. They're different in that palliative care can be given: At the same time as other treatments. During any phase of a serious illness, from diagnosis to  cure. Hospice care is given when: You're thought to have 6 months or less to live. You no longer can, or want to, try to find a cure for your illness. Who can get palliative care services? Palliative care is given to children and adults who are very ill. It may be offered if: You're not responding well to treatment. You need help with pain. You have side effects from treatment that are hard to manage. You have symptoms from the effects of surgery. You have been diagnosed with a disease that: Is advanced. Will shorten your life. Your health care provider may talk with you about palliative care if they think the support would help you. Your family and friends may also get help to manage stress and other concerns. Who makes up the palliative care team? The care team includes: You and your family. Your providers and specialists. Nurses. A Child psychotherapist, psychologist, or psychiatrist. Based on your needs, the team may also include: A pain specialist. A hospice specialist. Someone to help with finances or insurance. Religious or spiritual leaders. A case Production designer, theatre/television/film. A grief counselor. How will the palliative care team help me and my family? The team will speak with you and your family about: Your symptoms, such as: Pain. Nausea. Vomiting. Shortness of breath. The need for specialists. Advance directives. These may include: Living wills. Health care proxies. Symptoms that have to do with your mental health, such as: Stress. Depression. This is when you feel sad or hopeless. Anxiety. This is when you feel worried or nervous. Treatment options and how to keep as much as possible of your: Function. Ability to move  around. Spiritual wishes, such as: Rituals. Prayer. Things you can do to leave a legacy or make memories. Life and death as a normal process. End-of-life care. The team can also help you talk about: Hard issues. Spiritual concerns. Emotional concerns. Where to find  more information [employer] on Aging (NIA): BaseRingTones.pl National Hospice and Palliative Care Organization [facility]): https://rodriguez-phillips.com/ This information is not intended to replace advice given to you by your health care provider. Make sure you discuss any questions you have with your health care provider. Document Revised: 12/13/2022 Document Reviewed: 12/13/2022 Elsevier Patient Education  2024 Elsevier Inc.   Dialysis: What Is It? Dialysis is a treatment you may need if your kidneys aren't working the way they should. It takes waste, salt, and extra water out of your blood. If your kidneys have been damaged, dialysis may be needed for just a short time. You can stop the treatments when your kidneys heal and start working again. If you have kidney failure or end-stage kidney disease, your kidneys may have stopped working. You'll need dialysis the rest of your life, or until you get a new kidney from a donor. This is called a kidney transplant. Types of dialysis There are two main types of dialysis: hemodialysis and peritoneal dialysis. Below you can learn more about these two types, along with some of the pros and cons of each. Hemodialysis  Hemodialysis, or HD, is done with a machine. It takes out your blood, filters it, and puts it back in your body. This is done at a hospital or dialysis center three times a week. Each treatment takes 3 to 5 hours. Some people can get HD at home with the help of a family member or caregiver who has had special training. Before you start HD, you'll need minor surgery to create a vascular access. Your access is made in a blood vessel. It's used to connect to the machine. Pros of hemodialysis It's done less often than peritoneal dialysis. Someone else does the treatment for you. If you go to a dialysis center: Your health care providers can spot any problems you may be having. You can talk with and get support from others. Cons of hemodialysis It can cause  muscle cramps and low blood pressure. You may feel tired on the days you get treatment. You'll need appointments 3 days a week and will have to work around the dialysis center's schedule. You'll need a way to get to the center, even in bad weather. You'll need to take extra care when traveling. If you get treatment in a dialysis center, you'll need to arrange to visit a center wherever you go. If you do your treatments at home, you'll need to take the machine and supplies with you when you travel.  Peritoneal dialysis  Peritoneal dialysis, or PD, is when your blood is filtered inside your own body. It's done through the thin lining of your belly that's called the peritoneum. It uses a fluid called dialysate.  The dialysate fluid is put into your belly, where it draws in wastes, salt, and extra water from the blood. Then the fluid is drained from your body. This is called an exchange. PD can be done at home or almost anywhere. And after training, most people can do it on their own. You may need up to five exchanges a day. Each exchange takes 30 to 40 minutes. Between exchanges, the fluid is in your body for 1 to 3 hours. But this can vary with each  person. You can also do this treatment at night while you sleep, using a machine called a cycler. Before you start PD, surgery is done to put a soft tube, called a catheter, in your belly. The tube sticks out of your skin. It's where you move the fluid in and out of your body. Pros of peritoneal dialysis It's less likely than HD to cause side effects like cramps and low blood pressure. The diet changes are less strict than with HD. You can do exchanges on your own wherever you are, including when you travel. You can do PD every night. Cons of peritoneal dialysis More treatments are needed every day than with HD. You'll have to take care of the catheter site in your belly every day. You need to be able to handle the equipment and lift the heavy bags  of dialysate. You'll need a large space that's clean and dry to store supplies. You'll need to learn how to keep your equipment clean and free of germs to help prevent infection.  Talk with your provider Your provider will help you decide which type is best for you. You'll talk about your lifestyle, what you prefer, and your overall health. Both types of dialysis are safe treatments. Both work well to remove waste and extra fluid from your body when your kidneys can't do this. You can also ask to talk to people getting dialysis to learn more from them. Learn as much as you can and ask questions so you can decide what's best for you. Where to find more information You can learn more and find support online at: [employer]: kidney.org American Kidney Fund: kidneyfund.org This information is not intended to replace advice given to you by your health care provider. Make sure you discuss any questions you have with your health care provider. Document Revised: 01/19/2023 Document Reviewed: 01/19/2023 Elsevier Patient Education  2024 Elsevier Inc.Chronic Kidney Disease in Adults: What to Know Chronic kidney disease (CKD) is when lasting damage happens to the kidneys slowly over time. The kidneys are two organs that do many important things in the body. These include: Taking waste and extra fluid out of the blood to make pee (urine). Making hormones. Keeping the right amount of fluids and chemicals in the body. A small amount of kidney damage may not cause problems. You must take steps to help keep the kidney damage from getting worse. A lot of damage may cause kidney failure. Kidney failure means the kidneys can no longer work right. What are the causes? Diabetes. High blood pressure. Diseases that affect the heart and blood vessels. Other kidney diseases. Diseases that affect the body's defense system (immune system). A problem with the flow of pee. This may be caused by: Kidney  stones. Cancer. An enlarged prostate, in males. A kidney infection or urinary tract infection (UTI) that keeps coming back. What increases the risk? Getting older. The chances of having CKD increase with age. A family history of kidney disease or kidney failure. Having a disease caused by genes. Taking medicines that can harm the kidneys. Being near or having contact with harmful substances. Being very overweight. Using tobacco now or in the past. What are the signs or symptoms? Common symptoms of CKD include: Feeling very tired and having less energy. Swelling of the face, legs, ankles, or feet. Throwing up or feeling like you may throw up. Not wanting to eat as much as normal. Being confused or not able to focus. Twitches and cramps in  the leg muscles or other muscles. Dry, itchy skin. Other symptoms may include: Shortness of breath. Trouble sleeping. Making less pee, or making more pee, especially at night. A taste of metal in your mouth. You may also become anemic. Anemia means there's not enough red blood cells in your blood. You may get symptoms slowly. You may not notice them until the kidney damage gets very bad. How is this diagnosed? CKD may be diagnosed based on: Tests on your blood or pee. Imaging tests, like an ultrasound or a CT scan. A kidney biopsy. For this test, a sample of kidney tissue is removed to be looked at under a microscope. These tests will help to find out how serious the CKD is. How is this treated? Often, there's no cure for CKD. Treatment can help with symptoms and help keep the disease from getting worse. Treatment may include: Treating other problems that are causing your CKD or making it worse. Diet changes. You may need to: Avoid alcohol. Avoid foods that are high in salt, potassium, phosphorous, and protein. Taking medicines for symptoms and to help control other conditions. Dialysis. This treatment gets harmful waste out of your body. It  may be needed if you have kidney failure. Follow these instructions at home: Medicines Take your medicines only as told. The amount of some medicines you take may need to be changed. Do not take any new medicines, vitamins, or supplements unless your health care provider says it's okay. These may make kidney damage worse. Lifestyle Do not smoke, vape, or use nicotine or tobacco. If you drink alcohol: Limit how much you have to: 0-1 drink a day if you're male. 0-2 drinks a day if you're male. Know how much alcohol is in your drink. In the [place] one drink is one 12 oz bottle of beer (355 mL), one 5 oz glass of wine (148 mL), or one 1 oz glass of hard liquor (44 mL). Stay at a healthy weight. If you need help, ask your provider. General instructions  Eat and drink as told. Track your blood pressure at home. Tell your provider about any changes. If you have diabetes, track your blood sugar as told. Exercise at least 30 minutes a day, 5 days a week. Keep your shots (vaccinations) up to date. Keep all follow-up visits. Your provider may need to change your treatments over time. Where to find support American Kidney Fund: [place].com.au Kidney School: kidneyschool.org American Association of Kidney Patients: https://www.miller-montoya.com/ Where to find more information National Kidney Foundation: kidney.org Centers for Disease Control and Prevention. To learn more: Go to DiningCalendar.de. Click Search. Type chronic kidney disease in the search box. Contact a health care provider if: You have new symptoms. You get symptoms of end-stage kidney disease. These include: Headaches. Numbness in your hands or feet. Leg cramps. Easy bruising. Get help right away if: You have a fever. You make less pee than usual. You have pain or bleeding when you pee or poop. You have chest pain. You have shortness of breath. These symptoms may be an emergency. Call 911 right away. Do not wait to see if the symptoms will go  away. Do not drive yourself to the hospital. This information is not intended to replace advice given to you by your health care provider. Make sure you discuss any questions you have with your health care provider. Document Revised: 06/07/2023 Document Reviewed: 01/28/2023 Elsevier Patient Education  2024 [employer].

## 2024-05-30 NOTE — Patient Outreach (Signed)
 Complex Care Management   Visit Note  05/30/2024  Name:  Daniel Kline MRN: [id number] DOB: 1946-02-05  Situation: Referral received for Complex Care Management related to Heart Failure, Diabetes with Complications, Chronic Kidney Disease, and HTN I obtained verbal consent from Patient.  Visit completed with Patient  on the phone  Background:   Past Medical History:  Diagnosis Date   Anemia    H/O   Anemia in chronic kidney disease    Anxiety    Arthritis    Atrial flutter (HCC)    a. 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 03/2022 Echo: EF 60-65%, no rwma, GrI DD, nl RV fxn; b. 02/2024 Echo: EF 60 to 65% with Gr1 DD, nl RV function, and mild MR.   CKD (chronic kidney disease), stage IV (HCC)    Complication of anesthesia    CTCL (cutaneous T-cell lymphoma) (HCC)    Depression    Diabetes mellitus without complication (HCC)    Dysplastic nevus 12/19/2017   Right distal lat. forearm near wrist. Severe atypia, close to peripheral margin.   Dysplastic nevus 06/21/2018   Upper back right paraspinal. Severe atypia, peripheral margin involved. Excised 07/11/2018, margins free.   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 forearm. SCCis, hypertrophic.   Wears dentures     Assessment: Patient Reported Symptoms:  Cognitive Cognitive Status: No  symptoms reported Cognitive/Intellectual Conditions Management [RPT]: None reported or documented in medical history or problem list   Health Maintenance Behaviors: Annual physical exam  Neurological Neurological Review of Symptoms: No symptoms reported Neurological Management Strategies: Medication therapy, Routine screening  HEENT HEENT Symptoms Reported: No symptoms reported HEENT Comment: post cataract surgery - no issues, using drops as ordered    Cardiovascular Cardiovascular Symptoms Reported: No symptoms reported Does patient have uncontrolled Hypertension?: No Cardiovascular Management Strategies: Medication therapy, Routine screening, Medical device Weight: 218 lb (98.9 kg) Cardiovascular Comment: not needing lasix  prn - has gained 2 lb - reminded lasix  for 5 lb in 1 week - reviewed sx and red flags for ED, Radio broadcast assistant and [facility] weekly  Respiratory Respiratory Symptoms Reported: No symptoms reported Other Respiratory Symptoms: reports breathing is good overall, Respiratory Management Strategies: Routine screening, Exercise  Endocrine Endocrine Symptoms Reported: No symptoms reported Is patient diabetic?: Yes List most recent blood sugar readings, include date and time of day: FBG 100 today, ranging 106-140, no lows/highs Endocrine Self-Management Outcome: 4 (good)  Gastrointestinal Gastrointestinal Symptoms Reported: No symptoms reported      Genitourinary Genitourinary Symptoms Reported: No symptoms reported Genitourinary Comment: patient has not made decision about dialysis - Neph recommended palliative care, received call but did not discuss details, will send info as requested  via mail,  Integumentary Additional Integumentary Details: unna boots - weekly change with HHC    Musculoskeletal Musculoskelatal Symptoms Reviewed: No symptoms reported Additional Musculoskeletal Details: cane prn, occasional back pain if sits too lomg Musculoskeletal Management Strategies:  Medical device Falls in the past year?: No Number of falls in past year: 1 or less Was there an injury with Fall?: No Fall Risk Category Calculator: 0 Patient Fall Risk Level: Low Fall Risk Patient at Risk for Falls Due to: History of fall(s), Orthopedic patient Fall risk Follow up: Falls prevention discussed, Falls evaluation completed  Psychosocial Psychosocial Symptoms Reported: Sadness - if selected complete PHQ 2-9 Other Psychosocial Conditions: feeling better overall today          05/30/2024    PHQ2-9 Depression Screening   Little interest or pleasure in doing things Not at all  Feeling down, depressed, or hopeless Not at all  PHQ-2 - Total Score 0  Trouble falling or staying asleep, or sleeping too much    Feeling tired or having little energy    Poor appetite or overeating     Feeling bad about yourself - or that you are a failure or have let yourself or your family down    Trouble concentrating on things, such as reading the newspaper or watching television    Moving or speaking so slowly that other people could have noticed.  Or the opposite - being so fidgety or restless that you have been moving around a lot more than usual    Thoughts that you would be better off dead, or hurting yourself in some way    PHQ2-9 Total Score    If you checked off any problems, how difficult have these problems made it for you to do your work, take care of things at home, or get along with other people    Depression Interventions/Treatment      There were no vitals filed for this visit.  Medications Reviewed Today     Reviewed by Devra Lands, RN (Registered Nurse) on 05/30/24 at 1426  Med List Status: <None>   Medication Order Taking? Sig Documenting Provider Last Dose Status Informant  amLODipine  (NORVASC ) 10 MG tablet [id number] Yes Take 10 mg by mouth daily. [provider]  Active Self  calcitRIOL  (ROCALTROL ) 0.25 MCG capsule [id number]  Take 0.5 mcg by mouth daily.   Patient not taking: Reported on 05/10/2024   [provider]  Active Self  Cholecalciferol  (VITAMIN D -3) 25 MCG (1000 UT) CAPS [id number] Yes Take 1,000 Units by mouth daily. [provider]  Active Self  clorazepate  (TRANXENE ) 7.5 MG tablet [id number]  TAKE 1 TABLET BY MOUTH TWICE A DAY AS NEEDED FOR ANXIETY  Patient not taking: Reported on 05/30/2024   Webb, Padonda B, FNP  Active   EPINEPHrine  0.3 mg/0.3 mL IJ SOAJ injection [id number] Yes Inject 0.3 mg into the muscle as needed for anaphylaxis. [name], MD  Active Self           Med Note LESLY, RICHERD CINDERELLA Schaumann Jan 05, 2024  9:22 AM) Only takes as needed  Ferrous Sulfate  (IRON PO) [id number] Yes Take 1 tablet by mouth every morning. [provider]  Active Self           Med Note CARLON MYLINDA Kitchens May 11, 2021  2:15 PM)    folic acid  (FOLVITE ) 1 MG tablet [id number] Yes Take 1 mg by mouth daily.  Patient taking differently: Take 1  mg by mouth daily. States taking 3 daily   [provider]  Active Self  furosemide  (LASIX ) 40 MG tablet [id number] Yes Take 40 mg by mouth daily as needed (for weight gain of 3# or more). [provider]  Active   glucose blood (ONETOUCH VERIO) test strip [id number] Yes Use to check blood sugar once daily [name], MD  Active Self  hydrocortisone  2.5 % cream [id number] Yes Apply topically 3 (three) times daily as needed. [name], MD  Active Self           Med Note [facility], ELIZABETH A   Tue Dec 27, 2023  7:53 PM) PRN  hydrOXYzine  (VISTARIL ) 25 MG capsule [id number] Yes Take 1 capsule (25 mg total) by mouth every 8 (eight) hours as needed. Webb, Padonda B, FNP  Active   isosorbide  mononitrate (IMDUR ) 60 MG 24 hr tablet [id number]  Take 1 tablet (60 mg total) by mouth daily before lunch.  Patient not taking: Reported on 05/30/2024   Gollan, Timothy J, MD  Active Self  latanoprost  (XALATAN ) 0.005 % ophthalmic solution [id number] Yes Place 1 drop  into both eyes at bedtime. [provider]  Active Self  MAGNESIUM  PO [id number] Yes Take 1 tablet by mouth daily. [provider]  Active Self  methotrexate  (RHEUMATREX) 5 MG tablet [id number] Yes Take by mouth.  Patient taking differently: Take by mouth. States taking 2 tablets weekly   [provider]  Active   rosuvastatin  (CRESTOR ) 10 MG tablet [id number] Yes TAKE ONE TABLET BY MOUTH EVERY DAY Letvak, Richard I, MD  Active Self            Recommendation:   PCP Follow-up Continue Current Plan of Care  Follow Up Plan:   Telephone follow-up in 1 month  [name], MSN, RN [facility]  [facility], [facility] RN Care Manager Direct Dial: [phone] Fax: [phone]

## 2024-06-04 ENCOUNTER — Telehealth: Payer: Self-pay

## 2024-06-04 NOTE — Telephone Encounter (Signed)
 Okay to approve PT orders

## 2024-06-04 NOTE — Telephone Encounter (Signed)
 Copied from CRM #[id number]. Topic: Clinical - Home Health Verbal Orders >> Jun 04, 2024 12:09 PM Mia F wrote: Caller/Agency: Angie with [facility]  Callback Number: [phone] Service Requested: Physical Therapy Frequency: 1 week 8 Any new concerns about the patient? No

## 2024-06-05 NOTE — Telephone Encounter (Signed)
 Left verbal orders on verified VM for Angie.

## 2024-06-19 DIAGNOSIS — I872 Venous insufficiency (chronic) (peripheral): Secondary | ICD-10-CM | POA: Diagnosis not present

## 2024-06-19 DIAGNOSIS — I130 Hypertensive heart and chronic kidney disease with heart failure and stage 1 through stage 4 chronic kidney disease, or unspecified chronic kidney disease: Secondary | ICD-10-CM | POA: Diagnosis not present

## 2024-06-19 DIAGNOSIS — I491 Atrial premature depolarization: Secondary | ICD-10-CM

## 2024-06-19 DIAGNOSIS — C84A0 Cutaneous T-cell lymphoma, unspecified, unspecified site: Secondary | ICD-10-CM

## 2024-06-19 DIAGNOSIS — I480 Paroxysmal atrial fibrillation: Secondary | ICD-10-CM

## 2024-06-19 DIAGNOSIS — C8590 Non-Hodgkin lymphoma, unspecified, unspecified site: Secondary | ICD-10-CM

## 2024-06-19 DIAGNOSIS — D631 Anemia in chronic kidney disease: Secondary | ICD-10-CM

## 2024-06-19 DIAGNOSIS — E1122 Type 2 diabetes mellitus with diabetic chronic kidney disease: Secondary | ICD-10-CM | POA: Diagnosis not present

## 2024-06-19 DIAGNOSIS — J9611 Chronic respiratory failure with hypoxia: Secondary | ICD-10-CM

## 2024-06-19 DIAGNOSIS — I5032 Chronic diastolic (congestive) heart failure: Secondary | ICD-10-CM | POA: Diagnosis not present

## 2024-06-19 DIAGNOSIS — N184 Chronic kidney disease, stage 4 (severe): Secondary | ICD-10-CM

## 2024-06-19 DIAGNOSIS — E1140 Type 2 diabetes mellitus with diabetic neuropathy, unspecified: Secondary | ICD-10-CM

## 2024-06-20 ENCOUNTER — Other Ambulatory Visit: Payer: Self-pay

## 2024-06-20 MED ORDER — HYDROXYZINE PAMOATE 25 MG PO CAPS: 25.0000 mg | ORAL_CAPSULE | Freq: Three times a day (TID) | ORAL | 3 refills | Status: DC | PRN

## 2024-06-20 NOTE — Patient Outreach (Signed)
 Complex Care Management   Visit Note  06/20/2024  Name:  Daniel Kline MRN: [id number] DOB: May 08, 1946  Situation: Referral received for Complex Care Management related to Heart Failure, Diabetes with Complications, and Chronic Kidney Disease I obtained verbal consent from Patient.  Visit completed with Patient  on the phone  Background:   Past Medical History:  Diagnosis Date   Anemia    H/O   Anemia in chronic kidney disease    Anxiety    Arthritis    Atrial flutter (HCC)    a. 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 03/2022 Echo: EF 60-65%, no rwma, GrI DD, nl RV fxn; b. 02/2024 Echo: EF 60 to 65% with Gr1 DD, nl RV function, and mild MR.   CKD (chronic kidney disease), stage IV (HCC)    Complication of anesthesia    CTCL (cutaneous T-cell lymphoma) (HCC)    Depression    Diabetes mellitus without complication (HCC)    Dysplastic nevus 12/19/2017   Right distal lat. forearm near wrist. Severe atypia, close to peripheral margin.   Dysplastic nevus 06/21/2018   Upper back right paraspinal. Severe atypia, peripheral margin involved. Excised 07/11/2018, margins free.   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 forearm. SCCis, hypertrophic.   Wears dentures     Assessment: Patient Reported Symptoms:  Cognitive Cognitive Status: No  symptoms reported Cognitive/Intellectual Conditions Management [RPT]: None reported or documented in medical history or problem list   Health Maintenance Behaviors: Annual physical exam  Neurological Neurological Review of Symptoms: No symptoms reported    HEENT HEENT Symptoms Reported: No symptoms reported      Cardiovascular Cardiovascular Symptoms Reported: No symptoms reported Weight: 212 lb (96.2 kg) Cardiovascular Comment: has not needed prn lasix , weight stable, reviewed CHF Action Plan,  Respiratory Respiratory Symptoms Reported: Shortness of breath Other Respiratory Symptoms: occasional DOE Respiratory Management Strategies: Routine screening, Exercise  Endocrine Endocrine Symptoms Reported: No symptoms reported Is patient diabetic?: Yes Is patient checking blood sugars at home?: Yes List most recent blood sugar readings, include date and time of day: FBG 125 ranging 110-130    no lows/highs, no longer on glipizide     Gastrointestinal Gastrointestinal Symptoms Reported: No symptoms reported Additional Gastrointestinal Details: appetite good,      Genitourinary Genitourinary Symptoms Reported: No symptoms reported Additional Genitourinary Details: nephrology kidney function stable, completing calsses for DM per patient - helpful    Integumentary Additional Integumentary Details: unna boots completed, still weekly nurse hydrocortisone  for itchy lesions, neosporin  on irritated areas, no open draining, requesting refill for hydrcoizine Skin Management Strategies: Medication therapy, Routine screening  Musculoskeletal Musculoskelatal Symptoms Reviewed: Muscle pain Additional Musculoskeletal Details: cane prn no falls, occasional  shoulder pain, exercising Musculoskeletal Comment: PT weekly for leg strength - helping Falls in the past year?: No Number of falls in past year: 1 or less Was there an injury with Fall?: No Fall Risk Category Calculator: 0 Patient Fall Risk Level: Low  Fall Risk Fall risk Follow up: Falls evaluation completed, Falls prevention discussed  Psychosocial Psychosocial Symptoms Reported: No symptoms reported Other Psychosocial Conditions: kidney function stable so that helps mood Behavioral Management Strategies: Exercise Major Change/Loss/Stressor/Fears (CP): Medical condition, family, Medical condition, self Techniques to Cope with Loss/Stress/Change: Diversional activities, Exercise      06/20/2024    PHQ2-9 Depression Screening   Little interest or pleasure in doing things Several days  Feeling down, depressed, or hopeless Several days  PHQ-2 - Total Score 2  Trouble falling or staying asleep, or sleeping too much Several days  Feeling tired or having little energy Not at all  Poor appetite or overeating  Not at all  Feeling bad about yourself - or that you are a failure or have let yourself or your family down Not at all  Trouble concentrating on things, such as reading the newspaper or watching television Several days  Moving or speaking so slowly that other people could have noticed.  Or the opposite - being so fidgety or restless that you have been moving around a lot more than usual    Thoughts that you would be better off dead, or hurting yourself in some way Not at all  PHQ2-9 Total Score 4  If you checked off any problems, how difficult have these problems made it for you to do your work, take care of things at home, or get along with other people    Depression Interventions/Treatment      Vitals:   06/20/24 1412  BP: 120/70      Medications Reviewed Today   Medications were not reviewed in this encounter     Recommendation:   PCP Follow-up Continue Current Plan of Care  Follow Up Plan:   Telephone follow-up in 1 month  [name], MSN, RN [facility]  [facility], [facility] RN Care Manager Direct Dial: [phone] Fax: [phone]

## 2024-06-20 NOTE — Addendum Note (Signed)
 Addended by: Lealand Elting K on: 06/20/2024 09:22 PM   Modules accepted: Orders

## 2024-06-20 NOTE — Progress Notes (Signed)
 Hydroxyzine  sent as requested, routing to clinical pool to call patient and update.

## 2024-06-20 NOTE — Patient Instructions (Addendum)
 Visit Information  Thank you for taking time to visit with me today. Please don't hesitate to contact me if I can be of assistance to you before our next scheduled appointment.  Your next care management appointment is by telephone on 07/18/2024 at 1;30 pm  Telephone follow-up in 1 month  Please call the care guide team at [phone] if you need to cancel, schedule, or reschedule an appointment.   Please call the Suicide and Crisis Lifeline: 988 call the [place]  National Suicide Prevention Lifeline: 336-443-8307 or TTY: 606-294-3294 TTY 203-443-6305) to talk to a trained counselor call 1-800-273-TALK (toll free, 24 hour hotline) go to [facility] [address], [place] [phone]) call 911 if you are experiencing a Mental Health or Behavioral Health Crisis or need someone to talk to.  [name], MSN, RN [facility]  [facility], [facility] RN Care Manager Direct Dial: [phone] Fax: [phone]   Heart Failure Action Plan A heart failure action plan helps you know what to do when you have symptoms of heart failure. Your action plan is a color-coded plan that lists the symptoms to watch for and indicates what actions to take. If you have symptoms in the green zone, you're doing well. If you have symptoms in the yellow zone, you're having problems. If you have symptoms in the red zone, you need medical care right away. Follow the plan that was created by you and your health care provider. Review your plan each time you visit your provider. Green zone These signs mean you're doing well and can continue what you're doing: You don't have new or worsening shortness of breath. You have very little swelling or no new swelling. Your weight is stable (no gain or loss). You have a normal activity level. You don't have chest pain or any other new symptoms. Yellow zone These signs and symptoms mean your condition  may be getting worse and you should make some changes: You have trouble breathing when you're active. You have swelling in your feet or legs or have discomfort in your belly. You gain 2-3 lb (0.9-1.4 kg) in 24 hours, or 5 lb (2.3 kg) in a week. This amount may be more or less depending on your condition. You get tired easily. You have trouble sleeping. You have a dry cough. If you have any of these symptoms: Contact your provider within the next day. Your provider may adjust your medicines. Red zone These signs and symptoms mean you should get medical help right away: You have trouble breathing when resting or cannot lie flat and you need to raise your head to help you breathe. You have a dry cough that's getting worse. You have swelling or pain in your feet or legs or discomfort in your belly that's getting worse. You suddenly gain more than 2-3 lb (0.9-1.4 kg) in 24 hours, or more than 5 lb (2.3 kg) in a week. This amount may be more or less depending on your condition. You have trouble staying awake or you feel confused. You don't have an appetite. You have worsening sadness or depression. These symptoms may be an emergency. Call 911 right away. Do not wait to see if the symptoms will go away. Do not drive yourself to the hospital. Follow these instructions at home: Take medicines only as told. Eat a heart-healthy diet. Work with a dietitian to create an eating plan that's best for you. Weigh yourself each day. Your target weight is __________ lb (__________  kg). Call your provider if you gain more than __________ lb (__________ kg) in 24 hours, or more than __________ lb (__________ kg) in a week. Health care provider name: _____________________________________________________ Health care provider phone number: _____________________________________________________ Where to find more information American Heart Association: heart.org This information is not intended to replace advice  given to you by your health care provider. Make sure you discuss any questions you have with your health care provider. Document Revised: 03/10/2023 Document Reviewed: 03/10/2023 Elsevier Patient Education  2024 Elsevier Inc.  Chronic Kidney Disease in Adults: What to Know Chronic kidney disease (CKD) is when lasting damage happens to the kidneys slowly over time. The kidneys are two organs that do many important things in the body. These include: Taking waste and extra fluid out of the blood to make pee (urine). Making hormones. Keeping the right amount of fluids and chemicals in the body. A small amount of kidney damage may not cause problems. You must take steps to help keep the kidney damage from getting worse. A lot of damage may cause kidney failure. Kidney failure means the kidneys can no longer work right. What are the causes? Diabetes. High blood pressure. Diseases that affect the heart and blood vessels. Other kidney diseases. Diseases that affect the body's defense system (immune system). A problem with the flow of pee. This may be caused by: Kidney stones. Cancer. An enlarged prostate, in males. A kidney infection or urinary tract infection (UTI) that keeps coming back. What increases the risk? Getting older. The chances of having CKD increase with age. A family history of kidney disease or kidney failure. Having a disease caused by genes. Taking medicines that can harm the kidneys. Being near or having contact with harmful substances. Being very overweight. Using tobacco now or in the past. What are the signs or symptoms? Common symptoms of CKD include: Feeling very tired and having less energy. Swelling of the face, legs, ankles, or feet. Throwing up or feeling like you may throw up. Not wanting to eat as much as normal. Being confused or not able to focus. Twitches and cramps in the leg muscles or other muscles. Dry, itchy skin. Other symptoms may  include: Shortness of breath. Trouble sleeping. Making less pee, or making more pee, especially at night. A taste of metal in your mouth. You may also become anemic. Anemia means there's not enough red blood cells in your blood. You may get symptoms slowly. You may not notice them until the kidney damage gets very bad. How is this diagnosed? CKD may be diagnosed based on: Tests on your blood or pee. Imaging tests, like an ultrasound or a CT scan. A kidney biopsy. For this test, a sample of kidney tissue is removed to be looked at under a microscope. These tests will help to find out how serious the CKD is. How is this treated? Often, there's no cure for CKD. Treatment can help with symptoms and help keep the disease from getting worse. Treatment may include: Treating other problems that are causing your CKD or making it worse. Diet changes. You may need to: Avoid alcohol. Avoid foods that are high in salt, potassium, phosphorous, and protein. Taking medicines for symptoms and to help control other conditions. Dialysis. This treatment gets harmful waste out of your body. It may be needed if you have kidney failure. Follow these instructions at home: Medicines Take your medicines only as told. The amount of some medicines you take may need to be changed. Do  not take any new medicines, vitamins, or supplements unless your health care provider says it's okay. These may make kidney damage worse. Lifestyle Do not smoke, vape, or use nicotine or tobacco. If you drink alcohol: Limit how much you have to: 0-1 drink a day if you're male. 0-2 drinks a day if you're male. Know how much alcohol is in your drink. In the [place] one drink is one 12 oz bottle of beer (355 mL), one 5 oz glass of wine (148 mL), or one 1 oz glass of hard liquor (44 mL). Stay at a healthy weight. If you need help, ask your provider. General instructions  Eat and drink as told. Track your blood pressure at home. Tell  your provider about any changes. If you have diabetes, track your blood sugar as told. Exercise at least 30 minutes a day, 5 days a week. Keep your shots (vaccinations) up to date. Keep all follow-up visits. Your provider may need to change your treatments over time. Where to find support American Kidney Fund: eastdesmoines.com.au Kidney School: kidneyschool.org American Association of Kidney Patients: https://www.miller-montoya.com/ Where to find more information National Kidney Foundation: kidney.org Centers for Disease Control and Prevention. To learn more: Go to Diningcalendar.de. Click Search. Type chronic kidney disease in the search box. Contact a health care provider if: You have new symptoms. You get symptoms of end-stage kidney disease. These include: Headaches. Numbness in your hands or feet. Leg cramps. Easy bruising. Get help right away if: You have a fever. You make less pee than usual. You have pain or bleeding when you pee or poop. You have chest pain. You have shortness of breath. These symptoms may be an emergency. Call 911 right away. Do not wait to see if the symptoms will go away. Do not drive yourself to the hospital. This information is not intended to replace advice given to you by your health care provider. Make sure you discuss any questions you have with your health care provider. Document Revised: 06/07/2023 Document Reviewed: 01/28/2023 Elsevier Patient Education  2024 Elsevier Inc.   Palliative Care Palliative care can help make your quality of life better if you have a very serious illness. It involves care of your body, mind, and spirit. It's based on what you need and want in this stage of life. In many cases, care may take place in a hospital or long-term care setting. It may include: Help with pain and other symptoms. Family support. Spiritual support. Emotional support. Social support. Palliative care can help bring you comfort and peace of mind. It can be helpful to you and  your family during the course of an illness. What is the difference between palliative care and hospice? Palliative care and hospice have similar goals. They both aim to: Help with symptoms. Provide comfort. Make your quality of life better. Maintain your dignity. They're different in that palliative care can be given: At the same time as other treatments. During any phase of a serious illness, from diagnosis to cure. Hospice care is given when: You're thought to have 6 months or less to live. You no longer can, or want to, try to find a cure for your illness. Who can get palliative care services? Palliative care is given to children and adults who are very ill. It may be offered if: You're not responding well to treatment. You need help with pain. You have side effects from treatment that are hard to manage. You have symptoms from the effects of surgery. You have  been diagnosed with a disease that: Is advanced. Will shorten your life. Your health care provider may talk with you about palliative care if they think the support would help you. Your family and friends may also get help to manage stress and other concerns. Who makes up the palliative care team? The care team includes: You and your family. Your providers and specialists. Nurses. A child psychotherapist, psychologist, or psychiatrist. Based on your needs, the team may also include: A pain specialist. A hospice specialist. Someone to help with finances or insurance. Religious or spiritual leaders. A case production designer, theatre/television/film. A grief counselor. How will the palliative care team help me and my family? The team will speak with you and your family about: Your symptoms, such as: Pain. Nausea. Vomiting. Shortness of breath. The need for specialists. Advance directives. These may include: Living wills. Health care proxies. Symptoms that have to do with your mental health, such as: Stress. Depression. This is when you feel sad or  hopeless. Anxiety. This is when you feel worried or nervous. Treatment options and how to keep as much as possible of your: Function. Ability to move around. Spiritual wishes, such as: Rituals. Prayer. Things you can do to leave a legacy or make memories. Life and death as a normal process. End-of-life care. The team can also help you talk about: Hard issues. Spiritual concerns. Emotional concerns. Where to find more information [employer] on Aging (NIA): baseringtones.pl National Hospice and Palliative Care Organization [facility]): https://rodriguez-phillips.com/ This information is not intended to replace advice given to you by your health care provider. Make sure you discuss any questions you have with your health care provider. Document Revised: 12/13/2022 Document Reviewed: 12/13/2022 Elsevier Patient Education  2024 [employer].
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
## 2024-06-25 ENCOUNTER — Encounter: Payer: Self-pay | Admitting: Cardiovascular Disease

## 2024-06-25 ENCOUNTER — Ambulatory Visit: Attending: Cardiovascular Disease | Admitting: Cardiovascular Disease

## 2024-06-25 VITALS — BP 172/90 | HR 72 | Ht 66.0 in | Wt 212.0 lb

## 2024-06-25 DIAGNOSIS — I480 Paroxysmal atrial fibrillation: Secondary | ICD-10-CM

## 2024-06-25 DIAGNOSIS — I5032 Chronic diastolic (congestive) heart failure: Secondary | ICD-10-CM

## 2024-06-25 DIAGNOSIS — I483 Typical atrial flutter: Secondary | ICD-10-CM

## 2024-06-25 DIAGNOSIS — E782 Mixed hyperlipidemia: Secondary | ICD-10-CM | POA: Diagnosis not present

## 2024-06-25 DIAGNOSIS — E1121 Type 2 diabetes mellitus with diabetic nephropathy: Secondary | ICD-10-CM | POA: Diagnosis not present

## 2024-06-25 DIAGNOSIS — N184 Chronic kidney disease, stage 4 (severe): Secondary | ICD-10-CM

## 2024-06-25 DIAGNOSIS — I10 Essential (primary) hypertension: Secondary | ICD-10-CM

## 2024-06-25 DIAGNOSIS — I499 Cardiac arrhythmia, unspecified: Secondary | ICD-10-CM

## 2024-06-25 MED ORDER — DOXAZOSIN MESYLATE 2 MG PO TABS: 2.0000 mg | ORAL_TABLET | Freq: Two times a day (BID) | ORAL | 3 refills | Status: AC

## 2024-06-25 MED ORDER — ISOSORBIDE MONONITRATE ER 60 MG PO TB24: 60.0000 mg | ORAL_TABLET | Freq: Every day | ORAL | 3 refills | Status: AC

## 2024-06-25 MED ORDER — HYDROXYZINE HCL 10 MG PO TABS: 10.0000 mg | ORAL_TABLET | Freq: Three times a day (TID) | ORAL | 0 refills | Status: DC | PRN

## 2024-06-25 NOTE — Patient Instructions (Addendum)
 Medication Instructions:   Please stop amlodipine , can cause leg swelling  Please start cardura/doxazosin 2 mg twice a day  Please restart imdur /isosorbide  60 mg daily  Take Hydroxyzine  10 MG up to three times daily as needed for itching  If you need a refill on your cardiac medications before your next appointment, please call your pharmacy.   Lab work: No new labs needed  Testing/Procedures: No new testing needed  Follow-Up: At [facility], you and your health needs are our priority.  As part of our continuing mission to provide you with exceptional heart care, we have created designated Provider Care Teams.  These Care Teams include your primary Cardiologist (physician) and Advanced Practice Providers (APPs -  Physician Assistants and Nurse Practitioners) who all work together to provide you with the care you need, when you need it.  You will need a follow up appointment in 6 months  Providers on your designated Care Team:   [name], NP Bernardino Bring, PA-C Cadence [place], [place]  COVID-19 Vaccine Information can be found at: [email or web address] For questions related to vaccine distribution or appointments, please email [email or web address] or call [phone].

## 2024-07-03 ENCOUNTER — Ambulatory Visit

## 2024-07-03 ENCOUNTER — Ambulatory Visit: Payer: Self-pay

## 2024-07-03 VITALS — BP 126/64 | HR 92 | Temp 98.1°F | Ht 66.0 in | Wt 218.0 lb

## 2024-07-03 DIAGNOSIS — Z7185 Encounter for immunization safety counseling: Secondary | ICD-10-CM | POA: Diagnosis not present

## 2024-07-03 DIAGNOSIS — E1121 Type 2 diabetes mellitus with diabetic nephropathy: Secondary | ICD-10-CM | POA: Diagnosis not present

## 2024-07-03 DIAGNOSIS — F419 Anxiety disorder, unspecified: Secondary | ICD-10-CM

## 2024-07-03 DIAGNOSIS — L299 Pruritus, unspecified: Secondary | ICD-10-CM

## 2024-07-03 LAB — POCT GLYCOSYLATED HEMOGLOBIN (HGB A1C): Hemoglobin A1C: 6.3 % — AB (ref 4.0–5.6)

## 2024-07-03 MED ORDER — HYDROXYZINE HCL 10 MG PO TABS: 10.0000 mg | ORAL_TABLET | Freq: Four times a day (QID) | ORAL | 0 refills | Status: AC | PRN

## 2024-07-03 NOTE — Progress Notes (Signed)
 Subjective:   This visit was conducted in person. The patient gave informed consent to the use of Abridge AI technology to record the contents of the encounter as documented below.   Patient ID: Daniel Kline, male    DOB: 30-Aug-1945, 78 y.o.   MRN: [id number]   Discussed the use of AI scribe software for clinical note transcription with the patient, who gave verbal consent to proceed.  History of Present Illness Daniel Kline is a 78 year old male with T cell lymphoma who presents for medication review and management of multiple chronic conditions.  He has a history of T cell lymphoma and is under the care of an oncologist. Methotrexate  and folic acid  have been effective in managing sores caused by the lymphoma. He uses hydroxyzine  daily for pruritus, which he attributes to his lymphoma, and finds 25 mg more effective than 10 mg.  He underwent right knee replacement surgery approximately two years ago and reports a recent follow-up visit where he was informed the knee is in good condition. He has also had bilateral hip replacements. He experiences chronic back pain but notes significant improvement following surgery, with minimal pain currently.  He has a history of polio from childhood, resulting in some physical limitations, such as an arm that does not bend fully due to steel wiring. Despite this, he has been active in sports.  He uses oxygen at night, requiring two liters while sleeping, but does not need it during the day. He wears compression hose to manage leg swelling and no longer uses Unaboots due to skin irritation.  He takes several medications: Crestor  (rosuvastatin ) 10 mg daily for hyperlipidemia, methotrexate  10 mg weekly, folic acid  daily, melatonin 10 mg at bedtime, magnesium , latanoprost  eye drops, Imdur  (isosorbide  mononitrate) 60 mg daily, hydroxyzine  25 mg as needed, Lasix  40 mg as needed, vitamin D3, and iron supplements. He also has an EpiPen .  He has a history  of cataract surgery, with the last procedure occurring two months ago. He monitors his blood glucose levels regularly, noting occasional hyperglycemia due to dietary choices, but generally maintains levels below 130 mg/dL in the morning before eating.  He has a history of using chlorazepate for anxiety but has not taken it in the past two to three months. He uses it as needed to manage stress and anxiety related to personal circumstances.  No recent falls, dizziness, or acid reflux. Reports occasional heartburn, but not recently.   Review of Systems  All other systems reviewed and are negative.       Allergies  Allergen Reactions   Bee Venom Anaphylaxis   Beta Adrenergic Blockers Other (See Comments)    Symptomatic bradycardia   Oxycodone  Other (See Comments)    Delusions   Hydromorphone  Other (See Comments)    hallucinating   Hydroxychloroquine     Other reaction(s): Other (See Comments) He broke out really badly.   Zolpidem Other (See Comments)    Current Outpatient Medications on File Prior to Visit  Medication Sig Dispense Refill   Cholecalciferol  (VITAMIN D -3) 25 MCG (1000 UT) CAPS Take 1,000 Units by mouth daily.     doxazosin  (CARDURA ) 2 MG tablet Take 1 tablet (2 mg total) by mouth 2 (two) times daily. 180 tablet 3   EPINEPHrine  0.3 mg/0.3 mL IJ SOAJ injection Inject 0.3 mg into the muscle as needed for anaphylaxis. 1 each 5   Ferrous Sulfate  (IRON PO) Take 1 tablet by mouth every morning.  folic acid  (FOLVITE ) 1 MG tablet Take 1 mg by mouth daily.     furosemide  (LASIX ) 40 MG tablet Take 40 mg by mouth daily as needed (for weight gain of 3# or more).     glucose blood (ONETOUCH VERIO) test strip Use to check blood sugar once daily 100 each 12   hydrocortisone  2.5 % cream Apply topically 3 (three) times daily as needed. 56 g 3   isosorbide  mononitrate (IMDUR ) 60 MG 24 hr tablet Take 1 tablet (60 mg total) by mouth daily before lunch. 90 tablet 3   latanoprost   (XALATAN ) 0.005 % ophthalmic solution Place 1 drop into both eyes at bedtime.     MAGNESIUM  PO Take 1 tablet by mouth daily.     melatonin 5 MG TABS Take 10 mg by mouth at bedtime.     methotrexate  (RHEUMATREX) 5 MG tablet Take 10 mg by mouth once a week.     rosuvastatin  (CRESTOR ) 10 MG tablet TAKE ONE TABLET BY MOUTH EVERY DAY 90 tablet 3   No current facility-administered medications on file prior to visit.    BP 126/64 (BP Location: Left Arm, Patient Position: Sitting, Cuff Size: Normal)   Pulse 92   Temp 98.1 F (36.7 C) (Oral)   Ht 5' 6 (1.676 m)   Wt 218 lb (98.9 kg)   SpO2 94%   BMI 35.19 kg/m   Objective:      Physical Exam GENERAL: Alert, cooperative, well developed, no acute distress. HEAD: Normocephalic atraumatic. EYES: Extraocular movements intact BL, pupils round, equal and reactive to light BL, conjunctivae normal BL. EXTREMITIES: No cyanosis or edema. NEUROLOGICAL: Oriented to person, place and time, no gait abnormalities, moves all extremities without gross motor or sensory deficit.         Assessment & Plan:   Assessment & Plan Pruritus Has cutaneous T cell lymphoma for which he follows with oncology for surveillance, as a result, he does have intense, chronic itching, currently managed with hydroxyzine . Dosing adjusted for kidney function safety.  - Adjusted hydroxyzine  to 10 mg every six hours as needed. - May take two tablets as a one-time dose if very itchy or anxious, with 8-10 hours before next dose.  Anxiety  Patient has been using Clorazepate , counseled extensively about the risks of benzodiazepines, more so in light of severe CKD, he is agreeable to discontinue at this time and manage anxiety with hydroxyzine .  If unresponsive, may consider BuSpar versus SSRI.  - Use hydroxyzine  as needed, as stated above.   Type 2 Diabetes Mellitus, well-controlled Blood sugar generally well-controlled with only occasional high readings. A1c of 6.3, well  within age-appropriate goal which is less than 7.5 given age and comorbidities.  Home fasting sugars in the mornings are less than 130s most days.   - Scheduled follow-up in four weeks for full diabetic visit. - Perform fasting labs one week before next physical. - Continue diet and lifestyle measures to control blood sugar.  General Health Maintenance Discussed flu vaccination preference. - Ensure flu vaccination is received at the drugstore.   Return in about 4 weeks (around 07/31/2024) for diabetic visit then 10/30/2024 CPE, fasting labs 1 wk prior.   Rhiann Boucher K Aleyah Balik, MD  07/03/24     Contains text generated by Abridge.

## 2024-07-03 NOTE — Patient Instructions (Addendum)
 Thank you for visiting [facility] today! Here's what we talked about: - START hydroxyzine  10mg  for either itching or anxiety every 6 hours as needed. Watch for dizziness or drowsiness. If very itchy or very anxious, may take 2 tablets together as one time dose.  - Get flu shot from pharmacy

## 2024-07-10 ENCOUNTER — Inpatient Hospital Stay

## 2024-07-10 ENCOUNTER — Inpatient Hospital Stay: Attending: Oncology

## 2024-07-10 DIAGNOSIS — D631 Anemia in chronic kidney disease: Secondary | ICD-10-CM | POA: Diagnosis present

## 2024-07-10 DIAGNOSIS — N184 Chronic kidney disease, stage 4 (severe): Secondary | ICD-10-CM | POA: Insufficient documentation

## 2024-07-10 LAB — CBC (CANCER CENTER ONLY)
HCT: 28.3 % — ABNORMAL LOW (ref 39.0–52.0)
Hemoglobin: 9.6 g/dL — ABNORMAL LOW (ref 13.0–17.0)
MCH: 30.7 pg (ref 26.0–34.0)
MCHC: 33.9 g/dL (ref 30.0–36.0)
MCV: 90.4 fL (ref 80.0–100.0)
Platelet Count: 102 K/uL — ABNORMAL LOW (ref 150–400)
RBC: 3.13 MIL/uL — ABNORMAL LOW (ref 4.22–5.81)
RDW: 16 % — ABNORMAL HIGH (ref 11.5–15.5)
WBC Count: 3.5 K/uL — ABNORMAL LOW (ref 4.0–10.5)
nRBC: 0 % (ref 0.0–0.2)

## 2024-07-10 LAB — FERRITIN: Ferritin: 397 ng/mL — ABNORMAL HIGH (ref 24–336)

## 2024-07-10 LAB — IRON AND TIBC
Iron: 71 ug/dL (ref 45–182)
Saturation Ratios: 29 % (ref 17.9–39.5)
TIBC: 251 ug/dL (ref 250–450)
UIBC: 179 ug/dL

## 2024-07-18 ENCOUNTER — Other Ambulatory Visit: Payer: Self-pay

## 2024-07-18 NOTE — Patient Instructions (Signed)
 Visit Information  Thank you for taking time to visit with me today. Please don't hesitate to contact me if I can be of assistance to you before our next scheduled appointment.  Your next care management appointment is by telephone on 08/15/2024 at 1:00 pm  Telephone follow-up in 1 month  Please call the care guide team at [phone] if you need to cancel, schedule, or reschedule an appointment.   Please call the Suicide and Crisis Lifeline: 988 call the [place]  National Suicide Prevention Lifeline: 5015882283 or TTY: 4101301756 TTY (317) 093-4780) to talk to a trained counselor call 1-800-273-TALK (toll free, 24 hour hotline) go to [facility] [address], [place] [phone]) call 911 if you are experiencing a Mental Health or Behavioral Health Crisis or need someone to talk to.  [name], MSN, RN [facility], [facility] RN Care Manager Direct Dial: [phone] Fax: [phone]

## 2024-07-18 NOTE — Patient Outreach (Signed)
 Complex Care Management   Visit Note  07/18/2024  Name:  Daniel Kline MRN: [id number] DOB: Jul 05, 1946  Situation: Referral received for Complex Care Management related to Heart Failure and Chronic Kidney Disease I obtained verbal consent from Patient.  Visit completed with Patient  on the phone  Background:   Past Medical History:  Diagnosis Date   Anemia    H/O   Anemia in chronic kidney disease    Anxiety    Arthritis    Atrial flutter (HCC)    a. 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 03/2022 Echo: EF 60-65%, no rwma, GrI DD, nl RV fxn; b. 02/2024 Echo: EF 60 to 65% with Gr1 DD, nl RV function, and mild MR.   CKD (chronic kidney disease), stage IV (HCC)    Complication of anesthesia    CTCL (cutaneous T-cell lymphoma) (HCC)    Depression    Diabetes mellitus without complication (HCC)    Dysplastic nevus 12/19/2017   Right distal lat. forearm near wrist. Severe atypia, close to peripheral margin.   Dysplastic nevus 06/21/2018   Upper back right paraspinal. Severe atypia, peripheral margin involved. Excised 07/11/2018, margins free.   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 forearm. SCCis, hypertrophic.   Wears dentures     Assessment: Patient Reported Symptoms:  Cognitive Cognitive/Intellectual Conditions Management [RPT]: None  reported or documented in medical history or problem list      Neurological Neurological Review of Symptoms: No symptoms reported Neurological Management Strategies: Routine screening  HEENT        Cardiovascular Does patient have uncontrolled Hypertension?: No Weight: 213 lb (96.6 kg) Cardiovascular Comment: started cardura , isosorbide  for BP will get BP cuff with U-Card and check/record to provide to provider, reports leg swelling much improved, lasix  yesterday for 2 lb weight gain with improvement to 213 lb  Respiratory Respiratory Symptoms Reported: Shortness of breath Other Respiratory Symptoms: if moving fast gets SOB, improves with rest, will go back for flu shot tomorrow Respiratory Management Strategies: Routine screening, Exercise  Endocrine Endocrine Symptoms Reported: No symptoms reported Is patient diabetic?: Yes Is patient checking blood sugars at home?: Yes List most recent blood sugar readings, include date and time of day: FBG today 128  no lows/highs, ranging 101-128    Gastrointestinal Gastrointestinal Symptoms Reported: No symptoms reported      Genitourinary Genitourinary Symptoms Reported: No symptoms reported Additional Genitourinary Details: stable kidney function Genitourinary Comment: no dialysis, no Palliative Care  Integumentary Additional Integumentary Details: no open areas on legs, itching much improved with triamcinolone  cream and atarax  Skin Management Strategies: Medication therapy  Musculoskeletal Musculoskelatal Symptoms Reviewed: Muscle pain Additional Musculoskeletal Details: cane prn, no recent falls, exercising  trying to increase, Musculoskeletal Management Strategies: Medical device Musculoskeletal Comment: PT/OT weekly Falls in the past year?: Yes Was there an injury with Fall?: Yes    Psychosocial Psychosocial Symptoms Reported: No symptoms reported Other Psychosocial Conditions: reports doing ok, thinking about brother that died recently,  sister just dx with alzheimer's          07/18/2024    PHQ2-9 Depression Screening   Little interest or pleasure in doing things Not at all  Feeling down, depressed, or hopeless Not at all  PHQ-2 - Total Score 0  Trouble falling or staying asleep, or sleeping too much    Feeling tired or having little energy    Poor appetite or overeating     Feeling bad about yourself - or that you are a failure or have let yourself or your family down    Trouble concentrating on things, such as reading the newspaper or watching television    Moving or speaking so slowly that other people could have noticed.  Or the opposite - being so fidgety or restless that you have been moving around a lot more than usual    Thoughts that you would be better off dead, or hurting yourself in some way    PHQ2-9 Total Score    If you checked off any problems, how difficult have these problems made it for you to do your work, take care of things at home, or get along with other people    Depression Interventions/Treatment      Today's Vitals   07/18/24 1342  Weight: 213 lb (96.6 kg)      Medications Reviewed Today     Reviewed by Devra Lands, RN (Registered Nurse) on 07/18/24 at 1341  Med List Status: <None>   Medication Order Taking? Sig Documenting Provider Last Dose Status Informant  Cholecalciferol  (VITAMIN D -3) 25 MCG (1000 UT) CAPS [id number] Yes Take 1,000 Units by mouth daily. [provider]  Active Self  doxazosin  (CARDURA ) 2 MG tablet [id number] Yes Take 1 tablet (2 mg total) by mouth 2 (two) times daily. Gollan, Timothy J, MD  Active   EPINEPHrine  0.3 mg/0.3 mL IJ SOAJ injection [id number] Yes Inject 0.3 mg into the muscle as needed for anaphylaxis. [name], MD  Active Self           Med Note LESLY, RICHERD CINDERELLA Schaumann Jan 05, 2024  9:22 AM) Only takes as needed  Ferrous Sulfate  (IRON PO) [id number] Yes Take 1 tablet by mouth every morning.  Patient taking differently: Take 1  tablet by mouth every morning. Takes at night   [provider]  Active Self           Med Note CARLON MYLINDA Kitchens May 11, 2021  2:15 PM)    folic acid  (FOLVITE ) 1 MG tablet [id number] Yes Take 1 mg by mouth daily. [provider]  Active Self  furosemide  (LASIX ) 40 MG tablet [id number] Yes Take 40 mg by mouth daily as needed (for weight gain of 3# or more). [provider]  Active   glucose blood (ONETOUCH VERIO) test strip [id number] Yes Use to check blood sugar once daily [name], MD  Active Self  hydrocortisone  2.5 % cream [id number] Yes Apply topically 3 (three) times daily as needed. [name], MD  Active Self           Med Note [facility], ELIZABETH A   Tue Dec 27, 2023  7:53 PM)  PRN  hydrOXYzine  (ATARAX ) 10 MG tablet [id number] Yes Take 1 tablet (10 mg total) by mouth every 6 (six) hours as needed for itching. Bowa, Nahosi K, MD  Active   isosorbide  mononitrate (IMDUR ) 60 MG 24 hr tablet [id number] Yes Take 1 tablet (60 mg total) by mouth daily before lunch. Gollan, Timothy J, MD  Active   latanoprost  (XALATAN ) 0.005 % ophthalmic solution [id number] Yes Place 1 drop into both eyes at bedtime. [provider]  Active Self  MAGNESIUM  PO [id number] Yes Take 1 tablet by mouth daily. [provider]  Active Self  melatonin 5 MG TABS [id number] Yes Take 10 mg by mouth at bedtime. [provider]  Active   methotrexate  (RHEUMATREX) 5 MG tablet [id number] Yes Take 10 mg by mouth once a week. [provider]  Active   rosuvastatin  (CRESTOR ) 10 MG tablet [id number] Yes TAKE ONE TABLET BY MOUTH EVERY DAY [name], MD  Active Self            Recommendation:   PCP Follow-up Continue Current Plan of Care  Follow Up Plan:   Telephone follow-up in 1 month  [name], MSN, RN [facility]  [facility], [facility] RN Care Manager Direct Dial: [phone] Fax: [phone]

## 2024-07-20 ENCOUNTER — Telehealth: Payer: Self-pay

## 2024-07-20 NOTE — Telephone Encounter (Signed)
 Copied from CRM #[id number]. Topic: General - Call Back - No Documentation >> Jul 20, 2024 10:01 AM Mercedes MATSU wrote: Reason for CRM: Patient said they had a missed call but there was no voicemail or notes on chart. Requesting call back to [phone]

## 2024-07-20 NOTE — Telephone Encounter (Signed)
 Left message for pt's wife advising her I did not call and not sure who did.

## 2024-07-28 NOTE — Progress Notes (Signed)
 Called patient at his request to discuss labs. Patient does not have mychart  Patient has ESRD with creatinine 3.9 prior to restarting 5 mg MTX per week in July with most recent one from 12/25 of 3.7  He  had a HCT of 30 back in 10/24, got blood transfusions (Patient unsure of this or dates). With HCT of 38 04/2824 and now back to 30 on 07/10/24.  I think this degree of anemia is status quo for his ESRD  Patient relates his skin is doing  very well and renal function appears stable.    Will ask staff to send his labs and this note to his nephrologist, Dr. Arthur.  His office number is [phone]  Dr Flynn  07/26/24

## 2024-07-31 ENCOUNTER — Ambulatory Visit

## 2024-07-31 VITALS — BP 138/84 | HR 77 | Temp 97.6°F | Ht 66.0 in | Wt 221.0 lb

## 2024-07-31 DIAGNOSIS — Z23 Encounter for immunization: Secondary | ICD-10-CM

## 2024-07-31 DIAGNOSIS — N1832 Chronic kidney disease, stage 3b: Secondary | ICD-10-CM

## 2024-07-31 DIAGNOSIS — B351 Tinea unguium: Secondary | ICD-10-CM | POA: Diagnosis not present

## 2024-07-31 DIAGNOSIS — E1121 Type 2 diabetes mellitus with diabetic nephropathy: Secondary | ICD-10-CM | POA: Diagnosis not present

## 2024-07-31 NOTE — Patient Instructions (Addendum)
 Thank you for visiting [facility] today! Here's what we talked about: - START over the counter Claritin or zyrtec if nose congestion  - I will be in touch about Losartan 

## 2024-07-31 NOTE — Progress Notes (Signed)
 "  Subjective:   This visit was conducted in person. The patient gave informed consent to the use of Abridge AI technology to record the contents of the encounter as documented below.   Patient ID: Daniel Kline, male    DOB: [birth year], 78 y.o.   MRN: [id number]   Daniel Kline is a very pleasant 78 y.o. male who presents today for DM visit.  Discussed the use of AI scribe software for clinical note transcription with the patient, who gave verbal consent to proceed.  History of Present Illness Daniel Kline is a 78 year old male who presents for a routine follow-up visit.  He is here for a routine follow-up regarding his diabetes management. His last A1c was 6.3, which is within the target range, and he is not currently on any diabetes medication. He monitors his blood sugar daily, with a recent reading of 121 mg/dL. No numbness or tingling in his feet is reported.  He underwent cataract surgery on October 8th of this year and has regular eye check-ups.  He has been experiencing nasal congestion and occasional coughing for the past three weeks, with some bloody nasal discharge. No sore throat or significant pain is present. He has no known history of allergies but describes a 'rumbling in his chest' similar to bronchitis.  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 He also mentions having had knee replacement surgery and wears compression stockings due to previous leg wrapping that caused skin damage.  He is currently using triamcinolone  cream for itching on his legs, which has improved his skin condition. He has a history of high potassium levels, which led to the discontinuation of losartan .  He has toenail fungus and recalls a previous successful treatment with Griseofulvin that took three months to clear his nails.  He drinks approximately 52 ounces of water daily, using a fruit juice bottle to measure his  intake. He reports having bowel movements every other day and denies constipation.   -Denies polydipsia, polyuria, weight loss, fatigue or any LE wounds.    Review of Systems  All other systems reviewed and are negative.        Past Medical History:  Diagnosis Date   Anemia    H/O   Anemia in chronic kidney disease    Anxiety    Arthritis    Atrial flutter (HCC)    a. 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 03/2022 Echo: EF 60-65%, no rwma, GrI DD, nl RV fxn; b. 02/2024 Echo: EF 60 to 65% with Gr1 DD, nl RV function, and mild MR.   CKD (chronic kidney disease), stage IV (HCC)    Complication of anesthesia    CTCL (cutaneous T-cell lymphoma) (HCC)    Depression    Diabetes mellitus without complication (HCC)    Dysplastic nevus 12/19/2017   Right distal lat. forearm near wrist. Severe atypia, close to peripheral margin.   Dysplastic nevus 06/21/2018   Upper back right paraspinal. Severe atypia, peripheral margin involved. Excised 07/11/2018, margins free.   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 forearm. SCCis, hypertrophic.   Wears dentures     Social History   Socioeconomic History   Marital status: Married    Spouse name: Not on file   Number of children: 0   Years of education: Not on file   Highest education level: Not on  file  Occupational History   Occupation: Corporate investment banker    Comment: 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   Occupation: Engineering geologist    Comment: Retired   Occupation: Scientist, research (medical)    Comment: Retired  Tobacco Use   Smoking status: Former    Types: Cigars    Quit date: 09/23/1990    Years since quitting: 33.8    Passive exposure: Past (as a child)   Smokeless tobacco: Former    Quit date: 02/25/2006  Vaping Use   Vaping status: Never Used  Substance and Sexual Activity   Alcohol use: No    Alcohol/week: 0.0 standard drinks of alcohol   Drug use: No   Sexual activity: Yes    Partners: Female  Other Topics Concern   Not on file  Social History Narrative   Has living will   Wife is health care POA---then brother or sister   Would accept resuscitation attempts but no prolonged ventilation or tube feeds   Social Drivers of Health   Tobacco Use: Medium Risk (08/08/2024)   Patient History    Smoking Tobacco Use: Former    Smokeless Tobacco Use: Former    Passive Exposure: Past  Programmer, Applications: Low Risk  (06/07/2024)   Received from [facility]   Overall Financial Resource Strain (CARDIA)    Difficulty of Paying Living Expenses: Not hard at all  Food Insecurity: No Food Insecurity (06/07/2024)   Received from [facility]   Epic    Within the past 12 months, you worried that your food would run out before you got the money to buy more.: Never true    Within the past 12 months, the food you bought just didn't last and you didn't have money to get more.: Never true  Transportation Needs: No Transportation Needs (06/07/2024)   Received from [facility] - Transportation    In the past 12 months, has lack of transportation kept you from medical appointments or from getting medications?: No    Lack of Transportation (Non-Medical): No  Physical Activity: Inactive (03/22/2024)    Exercise Vital Sign    Days of Exercise per Week: 0 days    Minutes of Exercise per Session: 0 min  Stress: No Stress Concern Present (03/22/2024)   [employer] [facility] [employer] - Occupational Stress Questionnaire    Feeling of Stress: Only a little  Social Connections: Socially Isolated (03/22/2024)   Social Connection and Isolation Panel    Frequency of Communication with Friends and Family: Never    Frequency of Social Gatherings with Friends and Family: Never    Attends Religious Services: Never    Database Administrator or  Organizations: No    Attends Banker Meetings: Never    Marital Status: Married  Catering Manager Violence: Not At Risk (04/13/2024)   Epic    Fear of Current or Ex-Partner: No    Emotionally Abused: No    Physically Abused: No    Sexually Abused: No  Depression (PHQ2-9): Low Risk (07/31/2024)   Depression (PHQ2-9)    PHQ-2 Score: 0  Alcohol Screen: Not on file  Housing: Low Risk  (06/07/2024)   Received from [facility]   Epic    At any time in the past 12 months, were you homeless or living in a shelter (including now)?: No    In the past 12 months, how many times have you moved where you were living?: 0    In the last 12 months, was there a time when you were not able to pay the mortgage or rent on time?: No  Utilities: Not At Risk (06/07/2024)   Received from [facility]   Epic    In the past 12 months has the electric, gas, oil, or water company threatened to shut off services in your home?: No  Health Literacy: Adequate Health Literacy (03/22/2024)   B1300 Health Literacy    Frequency of need for help with medical instructions: Never    Past Surgical History:  Procedure Laterality Date   BACK SURGERY     LUMBAR   CARDIAC ELECTROPHYSIOLOGY STUDY AND ABLATION  2019   CATARACT EXTRACTION W/PHACO Right 05/05/2022   Procedure: CATARACT EXTRACTION PHACO AND INTRAOCULAR LENS PLACEMENT (IOC)  RIGHT;  Surgeon: Mittie Gaskin, MD;  Location: [facility];  Service: Ophthalmology;  Laterality: Right;  Diabetic 10.42 01:13.4   CATARACT EXTRACTION W/PHACO Left 05/16/2024   Procedure: PHACOEMULSIFICATION, CATARACT, WITH IOL INSERTION 17.44, 01:37.6;  Surgeon: Mittie Gaskin, MD;  Location: [facility];  Service: Ophthalmology;  Laterality: Left;   COLONOSCOPY WITH PROPOFOL  N/A 04/04/2019   Procedure: COLONOSCOPY WITH PROPOFOL ;  Surgeon: Toledo, Ladell POUR, MD;  Location: [facility] END[facility]COPY;  Service: Gastroenterology;  Laterality: N/A;   ESOPHAGOGASTRODUODENOSCOPY N/A 11/28/2023   Procedure: EGD (ESOPHAGOGASTRODUODENOSCOPY);  Surgeon: Jinny Carmine, MD;  Location: [facility] END[facility]COPY;  Service: Endoscopy;  Laterality: N/A;   I & D EXTREMITY Right 02/01/2020   Procedure: IRRIGATION AND DEBRIDEMENT EXTREMITY with poly exchange;  Surgeon: Mardee Lynwood SQUIBB, MD;  Location: [facility] ORS;  Service: Orthopedics;  Laterality: Right;   INCISION AND DRAINAGE     BACK-MRSA INFECTION AFTER BACK SURGERY   KNEE ARTHROPLASTY Right 01/28/2020   Procedure: COMPUTER ASSISTED TOTAL KNEE ARTHROPLASTY;  Surgeon: Mardee Lynwood SQUIBB, MD;  Location: [facility] ORS;  Service: Orthopedics;  Laterality: Right;   MOUTH SURGERY     right elbow surgery     right knee surgery     right shoulder surgery     from polio damage   TONSILLECTOMY     TOTAL HIP ARTHROPLASTY Bilateral 04/2016    Family History  Adopted: Yes    Allergies[1]  Medications Ordered Prior to Encounter[2]  BP 138/84 (BP Location: Left Arm, Patient Position: Sitting, Cuff Size: Large)   Pulse 77   Temp 97.6 F (36.4 C) (Oral)   Ht 5' 6 (1.676 m)   Wt 221 lb (100.2 kg)   SpO2 95%   BMI 35.67 kg/m   Objective:     Physical Exam GENERAL: Alert, cooperative, well developed, no acute distress. HEAD: Normocephalic atraumatic. EYES: Extraocular movements intact BL, pupils round, equal and reactive  to light BL, conjunctivae normal  BL. EARS: Tympanic membrane, ear canal and external ear normal BL. NOSE: No congestion or rhinorrhea, mucous membranes are moist. THROAT: No oropharyngeal exudate or posterior oropharyngeal erythema. CARDIOVASCULAR: Normal heart rate and rhythm, S1 and S2 normal without murmurs. CHEST: Clear to auscultation bilaterally, no wheezes, rhonchi, or crackles. ABDOMEN: Soft, non tender, non distended, without organomegaly, normal bowel sounds. EXTREMITIES: Scars present on legs. Fungal infection on toenails. No cyanosis or edema. NEUROLOGICAL: Oriented to person, place and time, no gait abnormalities, moves all extremities without gross motor or sensory deficit. Sensation symmetric and intact.     Physical Exam Vitals and nursing note reviewed.  Constitutional:      Appearance: Normal appearance.  Cardiovascular:     Pulses:          Dorsalis pedis pulses are 2+ on the right side and 2+ on the left side.       Posterior tibial pulses are 2+ on the right side and 2+ on the left side.  Feet:     Right foot:     Protective Sensation: 10 sites tested.  10 sites sensed.     Skin integrity: Skin integrity normal.     Toenail Condition: Right toenails are abnormally thick.     Left foot:     Protective Sensation: 10 sites tested.  10 sites sensed.     Skin integrity: Skin integrity normal.     Toenail Condition: Left toenails are abnormally thick.  Neurological:     Mental Status: He is alert.          Assessment & Plan:   Assessment & Plan Type 2 diabetes mellitus with diabetic nephropathy HLD A1c at 6.3, slightly elevated but still within age-appropriate goal. Morning blood sugars controlled. Discussed kidney protection with losartan , previously stopped by nephrology due to hyperkalemia, now considering restart as potassium levels are normal. HLD well-controlled on Crestor , no changes to regimen - Ordered urine sample for diabetes management. - Will consider restarting losartan  for  kidney protection. - Will repeat A1c in three months. - Continue Crestor  10 mg daily  Last A1c: 5.7 Current A1c: 6.3 Med regimen: None Urine microalbumin: Ordered Eye exam: UTD Foot exam: Done Ace-i/ARB: Held for hyerK, Daniel now normalized on recent labs Statin: Yes Influenza and Pneumococcal vaccines: Flu and prevnar given today  Neuropathy: No  Nephropathy: Yes Retinopathy: No  Stage 3b chronic kidney disease Kidney function stable. Discussed losartan  restart for kidney protection, BP is slightly above goal this visit at 138/84.  - Will consider restarting losartan  with close monitoring of potassium.  Onychomycosis Patient has a history of this, reportedly treated with oral antifungal in the past, obtained toenail sample today to confirm diagnosis, discussed oral terbinafine treatment if positive, 12-week course, and need to continue to monitor Micro-Daniel  water mild at 1 work please actually have 1 yeah and you will be able to jump high because honestly and I do not catch would be able to go from the floor to the top of that black so he will continue to jump iron mom liver function due to hepatotoxicity risk. - Sent toenail sample to lab for confirmation of fungal infection. - Will start terbinafine if lab confirms fungal infection.  General Health Maintenance Discussed flu and pneumonia vaccinations. Administered flu shot. Recommended pneumonia vaccine due to diabetes and time since last vaccination. - Administered flu shot today. - Administered updated pneumonia vaccine.   CPE scheduled for 11/01/2024.  Daniel Kline Daniel Benisha Hadaway,  MD  07/31/2024    Contains text generated by Abridge AI Software.         [1]  Allergies Allergen Reactions   Bee Venom Anaphylaxis   Beta Adrenergic Blockers Other (See Comments)    Symptomatic bradycardia   Oxycodone  Other (See Comments)    Delusions   Hydromorphone  Other (See Comments)    hallucinating   Hydroxychloroquine     Other reaction(s):  Other (See Comments) He broke out really badly.   Zolpidem Other (See Comments)  [2]  Current Outpatient Medications on File Prior to Visit  Medication Sig Dispense Refill   Cholecalciferol  (VITAMIN D -3) 25 MCG (1000 UT) CAPS Take 1,000 Units by mouth daily.     doxazosin  (CARDURA ) 2 MG tablet Take 1 tablet (2 mg total) by mouth 2 (two) times daily. 180 tablet 3   EPINEPHrine  0.3 mg/0.3 mL IJ SOAJ injection Inject 0.3 mg into the muscle as needed for anaphylaxis. 1 each 5   Ferrous Sulfate  (IRON PO) Take 1 tablet by mouth every morning. (Patient taking differently: Take 1 tablet by mouth every morning. Takes at night)     folic acid  (FOLVITE ) 1 MG tablet Take 1 mg by mouth daily.     furosemide  (LASIX ) 40 MG tablet Take 40 mg by mouth daily as needed (for weight gain of 3# or more).     glucose blood (ONETOUCH VERIO) test strip Use to check blood sugar once daily 100 each 12   hydrocortisone  2.5 % cream Apply topically 3 (three) times daily as needed. 56 g 3   hydrOXYzine  (ATARAX ) 10 MG tablet Take 1 tablet (10 mg total) by mouth every 6 (six) hours as needed for itching. 360 tablet 0   isosorbide  mononitrate (IMDUR ) 60 MG 24 hr tablet Take 1 tablet (60 mg total) by mouth daily before lunch. 90 tablet 3   latanoprost  (XALATAN ) 0.005 % ophthalmic solution Place 1 drop into both eyes at bedtime.     MAGNESIUM  PO Take 1 tablet by mouth daily.     melatonin 5 MG TABS Take 10 mg by mouth at bedtime.     methotrexate  (RHEUMATREX) 5 MG tablet Take 10 mg by mouth once a week.     rosuvastatin  (CRESTOR ) 10 MG tablet TAKE ONE TABLET BY MOUTH EVERY DAY 90 tablet 3   No current facility-administered medications on file prior to visit.   "

## 2024-08-01 LAB — MICROALBUMIN / CREATININE URINE RATIO
Creatinine,U: 83.5 mg/dL
Microalb Creat Ratio: 668.7 mg/g — ABNORMAL HIGH (ref 0.0–30.0)
Microalb, Ur: 55.8 mg/dL — ABNORMAL HIGH (ref 0.7–1.9)

## 2024-08-06 ENCOUNTER — Ambulatory Visit: Payer: Self-pay

## 2024-08-06 DIAGNOSIS — B351 Tinea unguium: Secondary | ICD-10-CM

## 2024-08-06 DIAGNOSIS — N184 Chronic kidney disease, stage 4 (severe): Secondary | ICD-10-CM

## 2024-08-12 MED ORDER — TERBINAFINE HCL 250 MG PO TABS: 125.0000 mg | ORAL_TABLET | Freq: Every day | ORAL | 0 refills | Status: AC

## 2024-08-13 ENCOUNTER — Encounter (INDEPENDENT_AMBULATORY_CARE_PROVIDER_SITE_OTHER): Payer: Self-pay | Admitting: Vascular Surgery

## 2024-08-13 ENCOUNTER — Ambulatory Visit (INDEPENDENT_AMBULATORY_CARE_PROVIDER_SITE_OTHER): Admitting: Vascular Surgery

## 2024-08-13 VITALS — BP 184/101 | HR 92 | Resp 18 | Ht 66.0 in | Wt 217.0 lb

## 2024-08-13 DIAGNOSIS — I872 Venous insufficiency (chronic) (peripheral): Secondary | ICD-10-CM

## 2024-08-13 DIAGNOSIS — I10 Essential (primary) hypertension: Secondary | ICD-10-CM | POA: Diagnosis not present

## 2024-08-13 DIAGNOSIS — E1121 Type 2 diabetes mellitus with diabetic nephropathy: Secondary | ICD-10-CM

## 2024-08-13 NOTE — Progress Notes (Signed)
 "  Subjective:    Patient ID: Daniel Kline, male    DOB: 1945/12/05, 79 y.o.   MRN: [id number] Chief Complaint  Patient presents with   Follow-up    3 month follow up no studies see BP    Daniel Kline is a 79 year old male who returns to clinic today for 31-month follow-up due to vascular insufficiency and swelling to his bilateral lower extremities.  Patient is doing very well today.  He continues to wear his compression socks.  He has no swelling to bilateral lower extremities today.  He is also continue to lose weight which has helped.  He continues to have skin cancer that is treated with ultraviolet light.  Therefore he continues to have some redness to his lower extremities as he endorses he does not like using it on his lower extremities due to previous swelling.  Otherwise he has no other complaints.  No other open sores wounds or difficulties.      Review of Systems  Constitutional: Negative.   Skin:  Positive for color change.  All other systems reviewed and are negative.      Objective:   Physical Exam Vitals reviewed.  Constitutional:      Appearance: He is obese.  HENT:     Head: Normocephalic and atraumatic.  Eyes:     Pupils: Pupils are equal, round, and reactive to light.  Cardiovascular:     Rate and Rhythm: Normal rate and regular rhythm.     Pulses: Normal pulses.     Heart sounds: Normal heart sounds.  Pulmonary:     Effort: Pulmonary effort is normal.     Breath sounds: Normal breath sounds.  Abdominal:     General: Bowel sounds are normal.     Palpations: Abdomen is soft.  Musculoskeletal:        General: Normal range of motion.     Comments: Patient with a history of chronic back problems.  She is losing weight and not having any more bilateral lower extremity swelling he is now walking without a cane.  He states he feels much better.  He endorses using physical therapy as he continues to have weakness to his lower extremities today.  Skin:     General: Skin is warm and dry.     Capillary Refill: Capillary refill takes 2 to 3 seconds.  Neurological:     General: No focal deficit present.     Mental Status: He is alert and oriented to person, place, and time. Mental status is at baseline.  Psychiatric:        Mood and Affect: Mood normal.        Behavior: Behavior normal.        Thought Content: Thought content normal.        Judgment: Judgment normal.     BP (!) 184/101 (BP Location: Left Arm, Patient Position: Sitting, Cuff Size: Large) Comment: pt took AM blood pressure pill and takes another one at 730pm  Pulse 92   Resp 18   Ht 5' 6 (1.676 m)   Wt 217 lb (98.4 kg)   BMI 35.02 kg/m   Past Medical History:  Diagnosis Date   Anemia    H/O   Anemia in chronic kidney disease    Anxiety    Arthritis    Atrial flutter (HCC)    a. s/p post ablation in 04/2017   CHF (congestive heart failure) (HCC)    Chronic heart failure with preserved ejection  fraction (HFpEF) (HCC)    a. 03/2022 Echo: EF 60-65%, no rwma, GrI DD, nl RV fxn; b. 02/2024 Echo: EF 60 to 65% with Gr1 DD, nl RV function, and mild MR.   CKD (chronic kidney disease), stage IV (HCC)    Complication of anesthesia    CTCL (cutaneous T-cell lymphoma) (HCC)    Depression    Diabetes mellitus without complication (HCC)    Dysplastic nevus 12/19/2017   Right distal lat. forearm near wrist. Severe atypia, close to peripheral margin.   Dysplastic nevus 06/21/2018   Upper back right paraspinal. Severe atypia, peripheral margin involved. Excised 07/11/2018, margins free.   Edentulous    Family history of adverse reaction to anesthesia    PT WAS ADOPTED   Hard of hearing    HLD (hyperlipidemia)    HTN (hypertension)    Hx of dysplastic nevus 2019   multiple sites   Hx of squamous cell carcinoma 01/18/2018   R mid lateral forearm   MRSA (methicillin resistant Staphylococcus aureus)    after back surgery   OSA (obstructive sleep apnea)    does not use bipap    Oxygen dependent    Polio    POLIOMYELITIS 01/12/2010   Right arm affected   PONV (postoperative nausea and vomiting)    Squamous cell carcinoma of skin 12/19/2017   Right mid lat. forearm. SCCis, hypertrophic.   Wears dentures     Social History   Socioeconomic History   Marital status: Married    Spouse name: Not on file   Number of children: 0   Years of education: Not on file   Highest education level: Not on file  Occupational History   Occupation: Corporate investment banker    Comment: 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   Occupation: Engineering geologist    Comment: Retired   Occupation: Scientist, research (medical)    Comment: Retired  Tobacco Use   Smoking status: Former    Types: Cigars    Quit date: 09/23/1990    Years since quitting: 33.9    Passive exposure: Past (as a child)   Smokeless tobacco: Former    Quit date: 02/25/2006  Vaping Use   Vaping status: Never Used  Substance and Sexual Activity   Alcohol use: No    Alcohol/week: 0.0 standard drinks of alcohol   Drug use: No   Sexual activity: Yes    Partners: Female  Other Topics Concern   Not on file  Social History Narrative   Has living will   Wife is health care POA---then brother or sister   Would accept resuscitation attempts but no prolonged ventilation or tube feeds   Social Drivers of Health   Tobacco Use: Medium Risk (08/13/2024)   Patient History    Smoking Tobacco Use: Former    Smokeless Tobacco Use: Former    Passive Exposure: Past  Programmer, Applications: Low Risk  (06/07/2024)   Received from [facility]   Overall Financial Resource Strain (CARDIA)    Difficulty of Paying Living Expenses: Not hard at all  Food Insecurity: No Food Insecurity (06/07/2024)   Received from [facility]   Epic    Within the past 12 months, you worried that your food would run out before you got the money to buy more.: Never true    Within the past 12 months, the food  you bought just didn't last and you didn't have money to get more.: Never true  Transportation Needs: No Transportation  Needs (06/07/2024)   Received from [facility] - Transportation    In the past 12 months, has lack of transportation kept you from medical appointments or from getting medications?: No    Lack of Transportation (Non-Medical): No  Physical Activity: Inactive (03/22/2024)   Exercise Vital Sign    Days of Exercise per Week: 0 days    Minutes of Exercise per Session: 0 min  Stress: No Stress Concern Present (03/22/2024)   [employer] of Occupational [facility] - Occupational Stress Questionnaire    Feeling of Stress: Only a little  Social Connections: Socially Isolated (03/22/2024)   Social Connection and Isolation Panel    Frequency of Communication with Friends and Family: Never    Frequency of Social Gatherings with Friends and Family: Never    Attends Religious Services: Never    Database Administrator or Organizations: No    Attends Banker Meetings: Never    Marital Status: Married  Catering Manager Violence: Not At Risk (04/13/2024)   Epic    Fear of Current or Ex-Partner: No    Emotionally Abused: No    Physically Abused: No    Sexually Abused: No  Depression (PHQ2-9): Low Risk (07/31/2024)   Depression (PHQ2-9)    PHQ-2 Score: 0  Alcohol Screen: Not on file  Housing: Low Risk  (06/07/2024)   Received from [facility]   Epic    At any time in the past 12 months, were you homeless or living in a shelter (including now)?: No    In the past 12 months, how many times have you moved where you were living?: 0    In the last 12 months, was there a time when you were not able to pay the mortgage or rent on time?: No  Utilities: Not At Risk (06/07/2024)   Received from [facility]   Epic    In the past 12 months has the electric, gas, oil, or water company threatened to shut off services in  your home?: No  Health Literacy: Adequate Health Literacy (03/22/2024)   B1300 Health Literacy    Frequency of need for help with medical instructions: Never    Past Surgical History:  Procedure Laterality Date   BACK SURGERY     LUMBAR   CARDIAC ELECTROPHYSIOLOGY STUDY AND ABLATION  2019   CATARACT EXTRACTION W/PHACO Right 05/05/2022   Procedure: CATARACT EXTRACTION PHACO AND INTRAOCULAR LENS PLACEMENT (IOC) RIGHT;  Surgeon: Mittie Gaskin, MD;  Location: [facility];  Service: Ophthalmology;  Laterality: Right;  Diabetic 10.42 01:13.4   CATARACT EXTRACTION W/PHACO Left 05/16/2024   Procedure: PHACOEMULSIFICATION, CATARACT, WITH IOL INSERTION 17.44, 01:37.6;  Surgeon: Mittie Gaskin, MD;  Location: [facility];  Service: Ophthalmology;  Laterality: Left;   COLONOSCOPY WITH PROPOFOL  N/A 04/04/2019   Procedure: COLONOSCOPY WITH PROPOFOL ;  Surgeon: Toledo, Ladell POUR, MD;  Location: [facility] END[facility];  Service: Gastroenterology;  Laterality: N/A;   ESOPHAGOGASTRODUODENOSCOPY N/A 11/28/2023   Procedure: EGD (ESOPHAGOGASTRODUODENOSCOPY);  Surgeon: Jinny Carmine, MD;  Location[facility];  Service: Endoscopy;  Laterality: N/A;   I & D EXTREMITY Right 02/01/2020   Procedure: IRRIGATION AND DEBRIDEMENT EXTREMITY with poly exchange;  Surgeon: Mardee Lynwood SQUIBB, MD;  Location: [facility] ORS;  Service: Orthopedics;  Laterality: Right;   INCISION AND DRAINAGE     BACK-MRSA INFECTION AFTER BACK SURGERY   KNEE ARTHROPLASTY Right 01/28/2020   Procedure: COMPUTER ASSISTED TOTAL KNEE ARTHROPLASTY;  Surgeon: Mardee,  Lynwood SQUIBB, MD;  Location: [facility] ORS;  Service: Orthopedics;  Laterality: Right;   MOUTH SURGERY     right elbow surgery     right knee surgery     right shoulder surgery     from polio damage   TONSILLECTOMY     TOTAL HIP ARTHROPLASTY Bilateral 04/2016    Family History  Adopted: Yes    Allergies[1]     Latest Ref Rng & Units 07/10/2024    3:00 PM 04/03/2024    1:15  PM 03/12/2024    2:27 PM  CBC  WBC 4.0 - 10.5 K/uL 3.5  5.6  6.3   Hemoglobin 13.0 - 17.0 g/dL 9.6  88.8  88.6   Hematocrit 39.0 - 52.0 % 28.3  34.1  34.7   Platelets 150 - 400 K/uL 102  167  178.0       CMP     Component Value Date/Time   NA 137 03/12/2024 1427   NA 141 10/28/2011 0325   K 4.6 03/12/2024 1427   K 3.9 10/28/2011 0325   CL 107 03/12/2024 1427   CL 106 10/28/2011 0325   CO2 24 03/12/2024 1427   CO2 25 10/28/2011 0325   GLUCOSE 122 (H) 03/12/2024 1427   GLUCOSE 121 (H) 10/28/2011 0325   BUN 45 (H) 03/12/2024 1427   BUN 22 (H) 10/28/2011 0325   CREATININE 3.30 (H) 03/12/2024 1427   CREATININE 3.87 (H) 03/06/2024 1342   CREATININE 1.27 (H) 02/09/2016 1421   CALCIUM  10.1 03/12/2024 1427   CALCIUM  7.9 (L) 10/28/2011 0325   PROT 6.5 02/28/2024 1820   ALBUMIN 3.9 03/12/2024 1427   AST 22 02/28/2024 1820   ALT 16 02/28/2024 1820   ALKPHOS 51 02/28/2024 1820   BILITOT 0.5 02/28/2024 1820   GFR 17.08 (L) 03/12/2024 1427   GFRNONAA 15 (L) 03/06/2024 1342   GFRNONAA 56 (L) 02/09/2016 1421     No results found.     Assessment & Plan:   1. Chronic venous insufficiency (Primary) Patient follows up today 3 months post prior visit where he was using bilateral Unna boot wraps.  He had considerable decrease swelling and that was converted to compression socks.  He has worn compression socks for the last 3 months with great success.  He also elevates his legs when not ambulating.  He is also restarted doing some exercises he has been able to walk better at this time.  Patient has no bilateral lower extremity edema due to his vascular insufficiency at this time today.  Therefore recommend a 57-month follow-up with me with no studies to continue to monitor.  Patient request this follow-up and I agree with the plan.  2. Essential hypertension Continue antihypertensive medications as already ordered, these medications have been reviewed and there are no changes at this  time.  3. Type 2 diabetes mellitus with diabetic nephropathy, without long-term current use of insulin  (HCC) Continue hypoglycemic medications as already ordered, these medications have been reviewed and there are no changes at this time.  Hgb A1C to be monitored as already arranged by primary service   Medications Ordered Prior to Encounter[2]  There are no Patient Instructions on file for this visit. No follow-ups on file.   Gwendlyn JONELLE Shank, NP      [1]  Allergies Allergen Reactions   Bee Venom Anaphylaxis   Beta Adrenergic Blockers Other (See Comments)    Symptomatic bradycardia   Oxycodone  Other (See Comments)    Delusions  Hydromorphone  Other (See Comments)    hallucinating   Hydroxychloroquine     Other reaction(s): Other (See Comments) He broke out really badly.   Zolpidem Other (See Comments)  [2]  Current Outpatient Medications on File Prior to Visit  Medication Sig Dispense Refill   Cholecalciferol  (VITAMIN D -3) 25 MCG (1000 UT) CAPS Take 1,000 Units by mouth daily.     doxazosin  (CARDURA ) 2 MG tablet Take 1 tablet (2 mg total) by mouth 2 (two) times daily. 180 tablet 3   EPINEPHrine  0.3 mg/0.3 mL IJ SOAJ injection Inject 0.3 mg into the muscle as needed for anaphylaxis. 1 each 5   Ferrous Sulfate  (IRON PO) Take 1 tablet by mouth every morning. (Patient taking differently: Take 1 tablet by mouth every morning. Takes at night)     folic acid  (FOLVITE ) 1 MG tablet Take 1 mg by mouth daily.     furosemide  (LASIX ) 40 MG tablet Take 40 mg by mouth daily as needed (for weight gain of 3# or more).     glucose blood (ONETOUCH VERIO) test strip Use to check blood sugar once daily 100 each 12   hydrocortisone  2.5 % cream Apply topically 3 (three) times daily as needed. 56 g 3   hydrOXYzine  (ATARAX ) 10 MG tablet Take 1 tablet (10 mg total) by mouth every 6 (six) hours as needed for itching. 360 tablet 0   isosorbide  mononitrate (IMDUR ) 60 MG 24 hr tablet Take 1 tablet (60 mg  total) by mouth daily before lunch. 90 tablet 3   latanoprost  (XALATAN ) 0.005 % ophthalmic solution Place 1 drop into both eyes at bedtime.     MAGNESIUM  PO Take 1 tablet by mouth daily.     melatonin 5 MG TABS Take 10 mg by mouth at bedtime.     methotrexate  (RHEUMATREX) 5 MG tablet Take 10 mg by mouth once a week.     rosuvastatin  (CRESTOR ) 10 MG tablet TAKE ONE TABLET BY MOUTH EVERY DAY 90 tablet 3   terbinafine  (LAMISIL ) 250 MG tablet Take 0.5 tablets (125 mg total) by mouth daily. 45 tablet 0   No current facility-administered medications on file prior to visit.   "

## 2024-08-15 ENCOUNTER — Other Ambulatory Visit: Payer: Self-pay

## 2024-08-15 ENCOUNTER — Ambulatory Visit: Payer: Self-pay

## 2024-08-15 NOTE — Telephone Encounter (Signed)
 Message from Hadassah PARAS sent at 08/15/2024  4:33 PM EST  Reason for Triage: Pt would like to speak with a nurse regarding using diabetic blood meter. Please advise pt on #[phone]

## 2024-08-15 NOTE — Patient Instructions (Signed)
 Visit Information  Thank you for taking time to visit with me today. Please don't hesitate to contact me if I can be of assistance to you before our next scheduled appointment.  Your next care management appointment is by telephone on 09/12/2024 at 1:00 pm  Telephone follow-up in 1 month  Please call the care guide team at [phone] if you need to cancel, schedule, or reschedule an appointment.   Please call the Suicide and Crisis Lifeline: 988 call the [place]  National Suicide Prevention Lifeline: 541-529-2763 or TTY: 339-360-0138 TTY (561)685-8994) to talk to a trained counselor call 1-800-273-TALK (toll free, 24 hour hotline) go to [facility] [address] [place] [phone]) call 911 if you are experiencing a Mental Health or Behavioral Health Crisis or need someone to talk to.  [name], MSN, RN [facility], [facility] RN Care Manager Direct Dial: [phone] Fax: [phone]

## 2024-08-15 NOTE — Patient Outreach (Signed)
 Complex Care Management   Visit Note  08/15/2024  Name:  Daniel Kline MRN: [id number] DOB: 04/27/1946  Situation: Referral received for Complex Care Management related to Heart Failure, Chronic Kidney Disease, and Falls I obtained verbal consent from Patient.  Visit completed with Patient  on the phone  Background:   Past Medical History:  Diagnosis Date   Anemia    H/O   Anemia in chronic kidney disease    Anxiety    Arthritis    Atrial flutter (HCC)    a. 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 03/2022 Echo: EF 60-65%, no rwma, GrI DD, nl RV fxn; b. 02/2024 Echo: EF 60 to 65% with Gr1 DD, nl RV function, and mild MR.   CKD (chronic kidney disease), stage IV (HCC)    Complication of anesthesia    CTCL (cutaneous T-cell lymphoma) (HCC)    Depression    Diabetes mellitus without complication (HCC)    Dysplastic nevus 12/19/2017   Right distal lat. forearm near wrist. Severe atypia, close to peripheral margin.   Dysplastic nevus 06/21/2018   Upper back right paraspinal. Severe atypia, peripheral margin involved. Excised 07/11/2018, margins free.   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 forearm. SCCis, hypertrophic.   Wears dentures     Assessment: Patient Reported Symptoms:  Cognitive Cognitive Status: No symptoms  reported Cognitive/Intellectual Conditions Management [RPT]: None reported or documented in medical history or problem list   Health Maintenance Behaviors: Annual physical exam, Immunizations, Healthy diet Health Facilitated by: Healthy diet, Rest  Neurological Neurological Review of Symptoms: No symptoms reported    HEENT HEENT Symptoms Reported: No symptoms reported      Cardiovascular Cardiovascular Symptoms Reported: No symptoms reported Does patient have uncontrolled Hypertension?: No Weight: 214 lb (97.1 kg) Cardiovascular Comment: BP 140/80 today lasix  prn reviewed HF AP, no leg swelling - using compression stockings  Respiratory Respiratory Symptoms Reported: No symptoms reported Other Respiratory Symptoms: received flu and pneumonia vaccine Respiratory Management Strategies: Routine screening  Endocrine Endocrine Symptoms Reported: No symptoms reported Is patient diabetic?: Yes List most recent blood sugar readings, include date and time of day: [facility] has new meter and going for training today - not recently checking, not feeling low/high    Gastrointestinal Gastrointestinal Symptoms Reported: No symptoms reported      Genitourinary Genitourinary Symptoms Reported: No symptoms reported Additional Genitourinary Details: Neph 08/22/24 stable, did nutrition classes    Integumentary Integumentary Symptoms Reported: Other Other Integumentary Symptoms: itching controlled with cream and hydroxizine- no open areas Additional Integumentary Details: toenail fungus - has not started med due to concerns about side effect - will discus with PCP    Musculoskeletal Musculoskelatal Symptoms  Reviewed: Muscle pain Additional Musculoskeletal Details: cane prn, no falls, exercising, feeling better Musculoskeletal Management Strategies: Medical device Falls in the past year?: Yes Number of falls in past year: 2 or more Was there an injury with Fall?: Yes Fall Risk Category Calculator:  3 Patient Fall Risk Level: High Fall Risk Patient at Risk for Falls Due to: History of fall(s) Fall risk Follow up: Falls evaluation completed, Falls prevention discussed  Psychosocial Psychosocial Symptoms Reported: No symptoms reported Other Psychosocial Conditions: did ok with holidays, things going well right now Behavioral Management Strategies: Exercise        08/15/2024    PHQ2-9 Depression Screening   Little interest or pleasure in doing things Not at all  Feeling down, depressed, or hopeless Not at all  PHQ-2 - Total Score 0  Trouble falling or staying asleep, or sleeping too much    Feeling tired or having little energy    Poor appetite or overeating     Feeling bad about yourself - or that you are a failure or have let yourself or your family down    Trouble concentrating on things, such as reading the newspaper or watching television    Moving or speaking so slowly that other people could have noticed.  Or the opposite - being so fidgety or restless that you have been moving around a lot more than usual    Thoughts that you would be better off dead, or hurting yourself in some way    PHQ2-9 Total Score    If you checked off any problems, how difficult have these problems made it for you to do your work, take care of things at home, or get along with other people    Depression Interventions/Treatment      Today's Vitals   08/15/24 1311  Weight: 214 lb (97.1 kg)      Medications Reviewed Today   Medications were not reviewed in this encounter     Recommendation:   PCP Follow-up Specialty provider follow-up Nephrology 08/22/24 Continue Current Plan of Care  Follow Up Plan:   Telephone follow-up in 1 month  [name], MSN, RN [facility]  [facility], [facility] RN Care Manager Direct Dial: [phone] Fax: [phone]

## 2024-08-16 NOTE — Telephone Encounter (Signed)
Tried to call pt. No answer and no VM 

## 2024-08-21 NOTE — Telephone Encounter (Signed)
 2nd Attempt: No answer and no VM to leave a message.

## 2024-08-23 ENCOUNTER — Telehealth: Payer: Self-pay | Admitting: Oncology

## 2024-08-23 NOTE — Telephone Encounter (Signed)
 Called pt to let him know Dr. Melanee wants to see him next week and to tell him the date and time he is scheduled and to make sure it worked. Nobody answered the phone and the voicemail is not set up so I am unable to leave a voicemail

## 2024-08-24 NOTE — Telephone Encounter (Signed)
 3rd Attempt: No answer no VM

## 2024-08-27 LAB — CULTURE, FUNGUS WITHOUT SMEAR
MICRO NUMBER:: 17392054
SPECIMEN QUALITY:: ADEQUATE

## 2024-08-29 ENCOUNTER — Encounter: Payer: Self-pay | Admitting: Oncology

## 2024-08-29 ENCOUNTER — Telehealth: Payer: Self-pay

## 2024-08-29 ENCOUNTER — Ambulatory Visit: Payer: Self-pay | Admitting: Oncology

## 2024-08-29 ENCOUNTER — Inpatient Hospital Stay: Attending: Oncology

## 2024-08-29 ENCOUNTER — Inpatient Hospital Stay: Admitting: Oncology

## 2024-08-29 ENCOUNTER — Inpatient Hospital Stay

## 2024-08-29 VITALS — BP 141/78 | HR 71 | Temp 97.4°F | Resp 20 | Ht 66.0 in | Wt 222.8 lb

## 2024-08-29 DIAGNOSIS — N184 Chronic kidney disease, stage 4 (severe): Secondary | ICD-10-CM | POA: Diagnosis not present

## 2024-08-29 DIAGNOSIS — D61810 Antineoplastic chemotherapy induced pancytopenia: Secondary | ICD-10-CM | POA: Insufficient documentation

## 2024-08-29 DIAGNOSIS — D631 Anemia in chronic kidney disease: Secondary | ICD-10-CM | POA: Diagnosis not present

## 2024-08-29 DIAGNOSIS — Z8616 Personal history of COVID-19: Secondary | ICD-10-CM | POA: Diagnosis not present

## 2024-08-29 DIAGNOSIS — T451X5A Adverse effect of antineoplastic and immunosuppressive drugs, initial encounter: Secondary | ICD-10-CM

## 2024-08-29 LAB — CBC (CANCER CENTER ONLY)
HCT: 26.9 % — ABNORMAL LOW (ref 39.0–52.0)
Hemoglobin: 8.9 g/dL — ABNORMAL LOW (ref 13.0–17.0)
MCH: 32 pg (ref 26.0–34.0)
MCHC: 33.1 g/dL (ref 30.0–36.0)
MCV: 96.8 fL (ref 80.0–100.0)
Platelet Count: 100 K/uL — ABNORMAL LOW (ref 150–400)
RBC: 2.78 MIL/uL — ABNORMAL LOW (ref 4.22–5.81)
RDW: 17.5 % — ABNORMAL HIGH (ref 11.5–15.5)
WBC Count: 3 K/uL — ABNORMAL LOW (ref 4.0–10.5)
nRBC: 0.7 % — ABNORMAL HIGH (ref 0.0–0.2)

## 2024-08-29 LAB — FERRITIN: Ferritin: 427 ng/mL — ABNORMAL HIGH (ref 24–336)

## 2024-08-29 LAB — DIFFERENTIAL
Abs Immature Granulocytes: 0.01 K/uL (ref 0.00–0.07)
Basophils Absolute: 0.1 K/uL (ref 0.0–0.1)
Basophils Relative: 2 %
Eosinophils Absolute: 0.1 K/uL (ref 0.0–0.5)
Eosinophils Relative: 2 %
Immature Granulocytes: 0 %
Lymphocytes Relative: 25 %
Lymphs Abs: 0.7 K/uL (ref 0.7–4.0)
Monocytes Absolute: 0.2 K/uL (ref 0.1–1.0)
Monocytes Relative: 8 %
Neutro Abs: 1.9 K/uL (ref 1.7–7.7)
Neutrophils Relative %: 63 %

## 2024-08-29 LAB — IRON AND TIBC
Iron: 45 ug/dL (ref 45–182)
Saturation Ratios: 19 % (ref 17.9–39.5)
TIBC: 239 ug/dL — ABNORMAL LOW (ref 250–450)
UIBC: 195 ug/dL

## 2024-08-29 MED ORDER — EPOETIN ALFA-EPBX 40000 UNIT/ML IJ SOLN: 40000.0000 [IU] | INTRAMUSCULAR | Status: DC

## 2024-08-29 NOTE — Progress Notes (Signed)
 Per Dr. Melanee reach out to Dr. Kelly Cindia Comer at [facility] and have her give Dr. Melanee a call tomorrow 08/30/24 to further discuss whether or not to give EPO considering patient being on methotrexate  can by causing pancytopenia.  Labs drawn today: Ferritin 427, iron 45, and TIBC 239.  Outbound call to [phone] spoke to Madelin Mace who states she knows the provider will request for us  to send an email address.  [name] could not locate Dr. Merritt email so she will send her an email requesting she contact Dr. Melanee tomorrow to further discuss the above.    Outbound call to [facility] 08/30/24 at 2:04pm.  Spoke w/ Madelin Mace who indicated that she did send the email to Dr. Comer but has not received a response as of yet.  Rep indicated provider is there only Mon, Tues, & Wed in the mornings.

## 2024-08-29 NOTE — Telephone Encounter (Signed)
 Please call patient and schedule for lab visit in 6 weeks, he does not have MyChart

## 2024-08-29 NOTE — Addendum Note (Signed)
 Addended by: Draxton Luu J on: 08/29/2024 03:22 PM   Modules accepted: Orders

## 2024-08-29 NOTE — Progress Notes (Signed)
 No Retacrit  per MD today

## 2024-08-31 ENCOUNTER — Telehealth: Payer: Self-pay

## 2024-08-31 NOTE — Telephone Encounter (Addendum)
 Per Dr. Melanee contact patient and let him know she was has touched base with dermatologist Dr. Kelly Comer and it was determined to reduce current methotrexate  dose by 1 tablet (current dose: Take 10 mg by mouth (2 tablets) once a week.  Follow up with Dr. Melanee as planned.  Outbound call to patient; informed of above.

## 2024-09-03 ENCOUNTER — Other Ambulatory Visit: Payer: Self-pay

## 2024-09-03 ENCOUNTER — Encounter: Payer: Self-pay | Admitting: Oncology

## 2024-09-03 MED ORDER — ACCU-CHEK GUIDE W/DEVICE KIT: 1.0000 | PACK | Freq: Once | 0 refills | Status: AC

## 2024-09-03 NOTE — Telephone Encounter (Signed)
 Called and spoke with pt and wife, he states he had already been contacted previously by someone at our office and has been taking medication as directed. Working on getting pt scheduled for labs  Pt declines setting up mychart at this time.

## 2024-09-03 NOTE — Telephone Encounter (Signed)
"   Pt states he is able to come in on 09/24/2024 at 2pm to have labs rechecked. Please schedule if available if not please call [phone] to reschedule "

## 2024-09-03 NOTE — Telephone Encounter (Signed)
 Rx sent electronically.

## 2024-09-03 NOTE — Progress Notes (Signed)
 "    Hematology/Oncology Consult note [facility]  [phone] [phone]  Patient Care Team: Bennett Reuben POUR, MD as PCP - General (Family Medicine) Perla Evalene PARAS, MD as PCP - Cardiology (Cardiology) Fate Morna SAILOR, [facility] (Inactive) as Pharmacist (Pharmacist) Melanee Annah BROCKS, MD as Consulting Physician (Oncology) Devra Lands, RN as [facility] Care Management   Name of the patient: Daniel Kline  [id number]  Jul 23, 1946   Date of visit: 09/03/24  Diagnosis-anemia of chronic kidney disease  Chief complaint/ Reason for visit-routine follow-up of anemia of chronic kidney disease  Heme/Onc history: Patient is a 79 year old male with a past medical history significant for hypertension hyperlipidemia type 2 diabetes CKD who was last seen by me in June 2023 while he was in the hospital with COVID and I was consulted for neutropenia.  Patient was subsequently admitted to the hospital in April 2025 for symptoms of fatigue and fall and was found to have significant pancytopenia with a hemoglobin of 6.6, platelets of 5 as well as neutropenia.  He was on methotrexate  weekly which she continued in the setting of worsening renal dysfunction and therefore methotrexate  was stopped to see if his counts recover.  Upon discharge from the hospital patient was noted to have white count of 6.3, H&H of 8.7/27 with a platelet count of 154.   Patient received 1 dose of Retacrit  in June 2025 and subsequently did not require Retacrit  as his hemoglobin was more than 10.  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  He was hospitalized last year for pancytopenia attributed to methotrexate , which improved after discontinuation of the drug. Six  months ago, hemoglobin stabilized and he received a single dose of erythropoietin in June 2025, with no further doses required as counts remained stable.  He is currently taking a low dose of methotrexate  (two tablets weekly, exact dose unknown) prescribed for a dermatologic manifestation of lymphoma. Methotrexate  has resolved his skin eruption, but his blood counts have declined since resuming therapy. Approximately eight days ago, hemoglobin was 8.9 g/dL with concurrent leukopenia and thrombocytopenia. Recent laboratory results are pending. He is also taking iron supplementation at night, with iron studies pending to assess for iron deficiency.  He is followed by nephrology for chronic kidney disease. He currently denies fatigue or decreased energy and reports feeling well overall.       ECOG PS- 1 Pain scale- 0   Review of systems- Review of Systems  Constitutional:  Positive for malaise/fatigue. Negative for chills, fever and weight loss.  HENT:  Negative for congestion, ear discharge and nosebleeds.   Eyes:  Negative for blurred vision.  Respiratory:  Negative for cough, hemoptysis, sputum production, shortness of breath and wheezing.   Cardiovascular:  Negative for chest pain, palpitations, orthopnea and claudication.  Gastrointestinal:  Negative for abdominal pain, blood in stool, constipation, diarrhea, heartburn, melena, nausea and vomiting.  Genitourinary:  Negative for dysuria, flank pain, frequency, hematuria and urgency.  Musculoskeletal:  Negative for back pain, joint pain and myalgias.  Skin:  Negative for rash.  Neurological:  Negative for dizziness, tingling, focal weakness, seizures, weakness and headaches.  Endo/Heme/Allergies:  Does not bruise/bleed easily.  Psychiatric/Behavioral:  Negative for depression and suicidal ideas. The patient does not have insomnia.       Allergies[1]  Past Medical History:  Diagnosis Date   Anemia    H/O   Anemia in chronic kidney  disease    Anxiety    Arthritis    Atrial flutter (HCC)    a. 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 03/2022 Echo: EF 60-65%, no rwma, GrI DD, nl RV fxn; b. 02/2024 Echo: EF 60 to 65% with Gr1 DD, nl RV function, and mild MR.   CKD (chronic kidney disease), stage IV (HCC)    Complication of anesthesia    CTCL (cutaneous T-cell lymphoma) (HCC)    Depression    Diabetes mellitus without complication (HCC)    Dysplastic nevus 12/19/2017   Right distal lat. forearm near wrist. Severe atypia, close to peripheral margin.   Dysplastic nevus 06/21/2018   Upper back right paraspinal. Severe atypia, peripheral margin involved. Excised 07/11/2018, margins free.   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 forearm. SCCis, hypertrophic.   Wears dentures      Past Surgical History:  Procedure Laterality Date   BACK SURGERY     LUMBAR   CARDIAC ELECTROPHYSIOLOGY STUDY AND ABLATION  2019   CATARACT EXTRACTION W/PHACO Right 05/05/2022   Procedure: CATARACT EXTRACTION PHACO AND INTRAOCULAR LENS PLACEMENT (IOC) RIGHT;  Surgeon: Mittie Gaskin, MD;  Location: [facility];  Service: Ophthalmology;  Laterality: Right;  Diabetic 10.42 01:13.4   CATARACT EXTRACTION W/PHACO Left 05/16/2024   Procedure: PHACOEMULSIFICATION, CATARACT, WITH IOL INSERTION 17.44,  01:37.6;  Surgeon: Mittie Gaskin, MD;  Location: [facility];  Service: Ophthalmology;  Laterality: Left;   COLONOSCOPY WITH PROPOFOL  N/A 04/04/2019   Procedure: COLONOSCOPY WITH PROPOFOL ;  Surgeon: Toledo, Ladell POUR, MD;  Location: [facility] END[facility];  Service: Gastroenterology;  Laterality: N/A;   ESOPHAGOGASTRODUODENOSCOPY N/A 11/28/2023   Procedure: EGD (ESOPHAGOGASTRODUODENOSCOPY);  Surgeon: Jinny Carmine, MD;  Location: [facility] END[facility];  Service: Endoscopy;  Laterality: N/A;   I & D EXTREMITY Right 02/01/2020   Procedure: IRRIGATION AND DEBRIDEMENT EXTREMITY with poly exchange;  Surgeon: Mardee Lynwood SQUIBB, MD;  Location: [facility] ORS;  Service: Orthopedics;  Laterality: Right;   INCISION AND DRAINAGE     BACK-MRSA INFECTION AFTER BACK SURGERY   KNEE ARTHROPLASTY Right 01/28/2020   Procedure: COMPUTER ASSISTED TOTAL KNEE ARTHROPLASTY;  Surgeon: Mardee Lynwood SQUIBB, MD;  Location: [facility] ORS;  Service: Orthopedics;  Laterality: Right;   MOUTH SURGERY     right elbow surgery     right knee surgery     right shoulder surgery     from polio damage   TONSILLECTOMY     TOTAL HIP ARTHROPLASTY Bilateral 04/2016    Social History   Socioeconomic History   Marital status: Married    Spouse name: Not  on file   Number of children: 0   Years of education: Not on file   Highest education level: Not on file  Occupational History   Occupation: Corporate investment banker    Comment: 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   Occupation: Engineering geologist    Comment: Retired   Occupation: Scientist, research (medical)    Comment: Retired  Tobacco Use   Smoking status: Former    Types: Cigars    Quit date: 09/23/1990    Years since quitting: 33.9    Passive exposure: Past (as a child)   Smokeless tobacco: Former    Quit date: 02/25/2006  Vaping Use   Vaping status: Never Used  Substance and Sexual Activity   Alcohol use: No    Alcohol/week: 0.0 standard drinks of alcohol   Drug use: No   Sexual  activity: Yes    Partners: Female  Other Topics Concern   Not on file  Social History Narrative   Has living will   Wife is health care POA---then brother or sister   Would accept resuscitation attempts but no prolonged ventilation or tube feeds   Social Drivers of Health   Tobacco Use: Medium Risk (08/29/2024)   Patient History    Smoking Tobacco Use: Former    Smokeless Tobacco Use: Former    Passive Exposure: Past  Programmer, Applications: Low Risk  (06/07/2024)   Received from [facility]   Overall Financial Resource Strain (CARDIA)    Difficulty of Paying Living Expenses: Not hard at all  Food Insecurity: No Food Insecurity (06/07/2024)   Received from [facility]   Epic    Within the past 12 months, you worried that your food would run out before you got the money to buy more.: Never true    Within the past 12 months, the food you bought just didn't last and you didn't have money to get more.: Never true  Transportation Needs: No Transportation Needs (06/07/2024)   Received from [facility] - Transportation    In the past 12 months, has lack of transportation kept you from medical appointments or from getting medications?: No    Lack of Transportation (Non-Medical): No  Physical Activity: Inactive (03/22/2024)   Exercise Vital Sign    Days of Exercise per Week: 0 days    Minutes of Exercise per Session: 0 min  Stress: No Stress Concern Present (03/22/2024)   [employer] of Occupational Health - Occupational Stress Questionnaire    Feeling of Stress: Only a little  Social Connections: Socially Isolated (03/22/2024)   Social Connection and Isolation Panel    Frequency of Communication with Friends and Family: Never    Frequency of Social Gatherings with Friends and Family: Never    Attends Religious Services: Never    Database Administrator or Organizations: No    Attends Banker Meetings:  Never    Marital Status: Married  Catering Manager Violence: Not At Risk (04/13/2024)   Epic    Fear of Current or Ex-Partner: No    Emotionally Abused: No    Physically Abused: No    Sexually Abused: No  Depression (PHQ2-9): Low Risk (08/29/2024)   Depression (PHQ2-9)    PHQ-2 Score: 0  Alcohol Screen: Not on file  Housing: Low Risk  (06/07/2024)   Received from [facility]   Epic    At any time in the past 12 months, were you homeless or  living in a shelter (including now)?: No    In the past 12 months, how many times have you moved where you were living?: 0    In the last 12 months, was there a time when you were not able to pay the mortgage or rent on time?: No  Utilities: Not At Risk (06/07/2024)   Received from [facility]   Epic    In the past 12 months has the electric, gas, oil, or water company threatened to shut off services in your home?: No  Health Literacy: Adequate Health Literacy (03/22/2024)   B1300 Health Literacy    Frequency of need for help with medical instructions: Never    Family History  Adopted: Yes    Current Medications[2]  Physical exam:  Vitals:   08/29/24 1425  BP: (!) 141/78  Pulse: 71  Resp: 20  Temp: (!) 97.4 F (36.3 C)  TempSrc: Tympanic  SpO2: 98%  Weight: 222 lb 12.8 oz (101.1 kg)  Height: 5' 6 (1.676 m)   Physical Exam Cardiovascular:     Rate and Rhythm: Normal rate and regular rhythm.     Heart sounds: Normal heart sounds.  Pulmonary:     Effort: Pulmonary effort is normal.     Breath sounds: Normal breath sounds.  Skin:    General: Skin is warm and dry.  Neurological:     Mental Status: He is alert and oriented to person, place, and time.      I have personally reviewed labs listed below:    Latest Ref Rng & Units 03/12/2024    2:27 PM  CMP  Glucose 70 - 99 mg/dL 877   BUN 6 - 23 mg/dL 45   Creatinine 9.59 - 1.50 mg/dL 6.69   Sodium 864 - 854 mEq/L 137   Potassium 3.5 - 5.1  mEq/L 4.6   Chloride 96 - 112 mEq/L 107   CO2 19 - 32 mEq/L 24   Calcium  8.4 - 10.5 mg/dL 89.8       Latest Ref Rng & Units 08/29/2024    2:03 PM  CBC  WBC 4.0 - 10.5 K/uL 3.0   Hemoglobin 13.0 - 17.0 g/dL 8.9   Hematocrit 60.9 - 52.0 % 26.9   Platelets 150 - 400 K/uL 100     Assessment and plan- Patient is a 79 y.o. 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 Counts improved when methotrexate  was held, worsened upon reinitiation.  I discussed his case with dermatology Dr. Chandra.  She has recommended that he can go down to methotrexate  5 mg weekly from 10 mg weekly and we can see if the cytopenias get better.  Is important for him to stay on weekly methotrexate  as discontinuing the drug results and significant worsening of his skin rash.  - We will repeat his CBC in 2 weeks in 4 weeks and if cytopenias persist despite reducing the dose of methotrexate  I will reinitiate EPO at that time.  I am holding off on bone marrow biopsy  -Given that he has pancytopenia and not just anemia I am thinking that methotrexate  is playing a role in his anemia and it is not just his kidney disease        Visit Diagnosis 1. Anemia of chronic kidney failure, stage 4 (severe) (HCC)      Dr. Annah Skene, MD, MPH  [facility] [phone] 09/03/2024 3:46 PM                   [1]  Allergies Allergen Reactions   Bee Venom Anaphylaxis   Beta Adrenergic Blockers Other (See Comments)    Symptomatic bradycardia   Oxycodone  Other (See Comments)    Delusions   Hydromorphone  Other (See Comments)    hallucinating   Hydroxychloroquine     Other reaction(s): Other (See Comments) He broke out really badly.   Zolpidem Other (See Comments)  [2]  Current Outpatient Medications:    Cholecalciferol  (VITAMIN D -3) 25  MCG (1000 UT) CAPS, Take 1,000 Units by mouth daily., Disp: , Rfl:    doxazosin  (CARDURA ) 2 MG tablet, Take 1 tablet (2 mg total) by mouth 2 (two) times daily., Disp: 180 tablet, Rfl: 3   EPINEPHrine  0.3 mg/0.3 mL IJ SOAJ injection, Inject 0.3 mg into the muscle as needed for anaphylaxis., Disp: 1 each, Rfl: 5   Ferrous Sulfate  (IRON PO), Take 1 tablet by mouth every morning. (Patient taking differently: Take 1 tablet by mouth every morning. Takes at night), Disp: , Rfl:    folic acid  (FOLVITE ) 1 MG tablet, Take 1 mg by mouth daily., Disp: , Rfl:    furosemide  (LASIX ) 80 MG tablet, Take 80 mg by mouth 2 (two) times daily., Disp: , Rfl:    glucose blood (ONETOUCH VERIO) test strip, Use to check blood sugar once daily, Disp: 100 each, Rfl: 12   hydrocortisone  2.5 % cream, Apply topically 3 (three) times daily as needed., Disp: 56 g, Rfl: 3   isosorbide  mononitrate (IMDUR ) 60 MG 24 hr tablet, Take 1 tablet (60 mg total) by mouth daily before lunch., Disp: 90 tablet, Rfl: 3   latanoprost  (XALATAN ) 0.005 % ophthalmic solution, Place 1 drop into both eyes at bedtime., Disp: , Rfl:    MAGNESIUM  PO, Take 1 tablet by mouth daily., Disp: , Rfl:    melatonin 5 MG TABS, Take 10 mg by mouth at bedtime., Disp: , Rfl:    methotrexate  (RHEUMATREX) 5 MG tablet, Take 10 mg by mouth once a week., Disp: , Rfl:    rosuvastatin  (CRESTOR ) 10 MG tablet, TAKE ONE TABLET BY MOUTH EVERY DAY, Disp: 90 tablet, Rfl: 3   telmisartan (MICARDIS) 20 MG tablet, Take 20 mg by mouth., Disp: , Rfl:    Blood Glucose Monitoring Suppl (ACCU-CHEK GUIDE) w/Device KIT, 1 each by Does not apply route once for 1 dose., Disp: 1 kit, Rfl: 0   furosemide  (LASIX ) 40 MG tablet, Take 40 mg by mouth daily as needed (for weight gain of 3# or more). (Patient not taking: Reported on 08/29/2024), Disp: , Rfl:    hydrOXYzine  (ATARAX ) 10 MG tablet, Take 1 tablet (10 mg total) by mouth every 6 (six) hours as needed for itching. (Patient not taking: Reported  on 08/29/2024), Disp: 360 tablet, Rfl: 0   terbinafine  (LAMISIL ) 250 MG tablet, Take 0.5 tablets (125 mg total) by mouth daily. (Patient not taking: Reported on 08/15/2024), Disp: 45 tablet, Rfl: 0  "

## 2024-09-03 NOTE — Telephone Encounter (Signed)
 Daniel Kline

## 2024-09-04 ENCOUNTER — Encounter: Payer: Self-pay | Admitting: Oncology

## 2024-09-06 ENCOUNTER — Other Ambulatory Visit (INDEPENDENT_AMBULATORY_CARE_PROVIDER_SITE_OTHER)

## 2024-09-06 DIAGNOSIS — N184 Chronic kidney disease, stage 4 (severe): Secondary | ICD-10-CM

## 2024-09-06 NOTE — Progress Notes (Signed)
 lvm for pt to call office to schedule appt.

## 2024-09-07 ENCOUNTER — Telehealth: Payer: Self-pay

## 2024-09-07 LAB — COMPREHENSIVE METABOLIC PANEL WITH GFR
ALT: 13 U/L (ref 3–53)
AST: 11 U/L (ref 5–37)
Albumin: 3.9 g/dL (ref 3.5–5.2)
Alkaline Phosphatase: 43 U/L (ref 39–117)
BUN: 39 mg/dL — ABNORMAL HIGH (ref 6–23)
CO2: 26 meq/L (ref 19–32)
Calcium: 9.4 mg/dL (ref 8.4–10.5)
Chloride: 104 meq/L (ref 96–112)
Creatinine, Ser: 3.93 mg/dL — ABNORMAL HIGH (ref 0.40–1.50)
GFR: 13.81 mL/min — CL
Glucose, Bld: 107 mg/dL — ABNORMAL HIGH (ref 70–99)
Potassium: 4.4 meq/L (ref 3.5–5.1)
Sodium: 138 meq/L (ref 135–145)
Total Bilirubin: 0.6 mg/dL (ref 0.2–1.2)
Total Protein: 6 g/dL (ref 6.0–8.3)

## 2024-09-07 NOTE — Telephone Encounter (Signed)
 Left detailed message on wife's VM per DPR

## 2024-09-07 NOTE — Telephone Encounter (Signed)
 Pls call pt and inform that his labs show his kidney function is a bit worse than before, he should make sure he is hydrating well, drinking at least 35 to 40 ounces daily of water, he should make sure to keep his upcoming appointment with vascular surgery on 5th February as they will evaluate him for hemodialysis access. His potassium level came back normal, he should keep a low potassium diet because of his kidneys: Avoid foods like leafy green vegetables, bananas, oranges, milk and yogurt, all these foods are high in potassium.

## 2024-09-07 NOTE — Telephone Encounter (Signed)
 Received call from Saa, with [facility], calling with the following critical lab results:  GFR:  13.81

## 2024-09-09 ENCOUNTER — Ambulatory Visit: Payer: Self-pay

## 2024-09-11 ENCOUNTER — Other Ambulatory Visit (INDEPENDENT_AMBULATORY_CARE_PROVIDER_SITE_OTHER): Payer: Self-pay | Admitting: Nurse Practitioner

## 2024-09-11 DIAGNOSIS — N186 End stage renal disease: Secondary | ICD-10-CM

## 2024-09-12 ENCOUNTER — Other Ambulatory Visit

## 2024-09-12 NOTE — Patient Instructions (Signed)
 Visit Information  Thank you for taking time to visit with me today. Please don't hesitate to contact me if I can be of assistance to you before our next scheduled appointment.  Your next care management appointment is by telephone on 10/09/2024 at 1:00 PM   Telephone follow-up in 1 month  Please call the care guide team at [phone] if you need to cancel, schedule, or reschedule an appointment.   Please call the Suicide and Crisis Lifeline: 988 call the [place]  National Suicide Prevention Lifeline: 671-132-1459 or TTY: 249 286 4338 TTY (782)227-3118) to talk to a trained counselor call 1-800-273-TALK (toll free, 24 hour hotline) call 911 if you are experiencing a Mental Health or Behavioral Health Crisis or need someone to talk to.  Hendricks Her RN, BSN  [place] [facility] RN Case Information Systems Manager [phone]

## 2024-09-12 NOTE — Patient Outreach (Signed)
 Complex Care Management   Visit Note  09/12/2024  Name:  Daniel Kline MRN: [id number] DOB: 1946/03/11  Situation: Referral received for Complex Care Management related to Heart Failure and Chronic Kidney Disease I obtained verbal consent from Patient.  Visit completed with Patient  on the phone  Background:   Past Medical History:  Diagnosis Date   Anemia    H/O   Anemia in chronic kidney disease    Anxiety    Arthritis    Atrial flutter (HCC)    a. 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 03/2022 Echo: EF 60-65%, no rwma, GrI DD, nl RV fxn; b. 02/2024 Echo: EF 60 to 65% with Gr1 DD, nl RV function, and mild MR.   CKD (chronic kidney disease), stage IV (HCC)    Complication of anesthesia    CTCL (cutaneous T-cell lymphoma) (HCC)    Depression    Diabetes mellitus without complication (HCC)    Dysplastic nevus 12/19/2017   Right distal lat. forearm near wrist. Severe atypia, close to peripheral margin.   Dysplastic nevus 06/21/2018   Upper back right paraspinal. Severe atypia, peripheral margin involved. Excised 07/11/2018, margins free.   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 forearm. SCCis, hypertrophic.   Wears dentures     Assessment: Patient Reported Symptoms:  Cognitive Cognitive Status: No symptoms  reported Cognitive/Intellectual Conditions Management [RPT]: None reported or documented in medical history or problem list   Health Maintenance Behaviors: Annual physical exam, Healthy diet, Immunizations Healing Pattern: Slow Health Facilitated by: Healthy diet  Neurological Neurological Review of Symptoms: No symptoms reported Neurological Management Strategies: Coping strategies, Routine screening Neurological Self-Management Outcome: 4 (good)  HEENT HEENT Symptoms Reported: No symptoms reported HEENT Self-Management Outcome: 4 (good)    Cardiovascular Cardiovascular Symptoms Reported: No symptoms reported Does patient have uncontrolled Hypertension?: No Cardiovascular Management Strategies: Coping strategies, Routine screening Weight: 215 lb (97.5 kg) Cardiovascular Self-Management Outcome: 4 (good)  Respiratory Respiratory Symptoms Reported: No symptoms reported Other Respiratory Symptoms: has had flu and pneumonia vaccine Respiratory Management Strategies: Adequate rest, Routine screening Respiratory Self-Management Outcome: 4 (good)  Endocrine Endocrine Symptoms Reported: No symptoms reported Is patient diabetic?: Yes Is patient checking blood sugars at home?: Yes List most recent blood sugar readings, include date and time of day: FBS 111 fasting 09/12/2024; FBS 86 Fasting  09/11/2024 Endocrine Self-Management Outcome: 4 (good) Endocrine Comment: Discussed when to call provider  Gastrointestinal Gastrointestinal Symptoms Reported: No symptoms reported Gastrointestinal Management Strategies: Coping strategies Gastrointestinal Self-Management Outcome: 4 (good)    Genitourinary Genitourinary Symptoms Reported: No symptoms reported Additional Genitourinary Details: DM Nutrition class; Genitourinary Management Strategies: Adequate rest, Coping strategies Genitourinary Self-Management Outcome: 4 (good)  Integumentary  Integumentary Symptoms Reported: Itching, Rash Other  Integumentary Symptoms: itching- Skin Management Strategies: Medication therapy Skin Self-Management Outcome: 4 (good)  Musculoskeletal Musculoskelatal Symptoms Reviewed: Muscle pain Additional Musculoskeletal Details: uses cane PRN Musculoskeletal Management Strategies: Medical device Musculoskeletal Self-Management Outcome: 4 (good) Musculoskeletal Comment: PT/OT completed Falls in the past year?: No Number of falls in past year: 2 or more Was there an injury with Fall?: Yes Fall Risk Category Calculator: 2 Patient Fall Risk Level: Moderate Fall Risk Patient at Risk for Falls Due to: History of fall(s) Fall risk Follow up: Falls evaluation completed, Education provided, Falls prevention discussed  Psychosocial Psychosocial Symptoms Reported: No symptoms reported Additional Psychological Details: Still decling Palliative Care LCSW Behavioral Management Strategies: Exercise (walking/ climbing steps) Behavioral Health Self-Management Outcome: 4 (good) Behavioral Health Comment: Just watching it now Major Change/Loss/Stressor/Fears (CP): Medical condition, family, Medical condition, self Techniques to Cope with Loss/Stress/Change: Diversional activities, Exercise Quality of Family Relationships: supportive, helpful, involved Do you feel physically threatened by others?: No    09/12/2024    PHQ2-9 Depression Screening   Little interest or pleasure in doing things Not at all  Feeling down, depressed, or hopeless Several days  PHQ-2 - Total Score 1  Trouble falling or staying asleep, or sleeping too much    Feeling tired or having little energy    Poor appetite or overeating     Feeling bad about yourself - or that you are a failure or have let yourself or your family down    Trouble concentrating on things, such as reading the newspaper or watching television    Moving or speaking so slowly that other people could have noticed.  Or the opposite - being so fidgety or restless that you  have been moving around a lot more than usual    Thoughts that you would be better off dead, or hurting yourself in some way    PHQ2-9 Total Score    If you checked off any problems, how difficult have these problems made it for you to do your work, take care of things at home, or get along with other people    Depression Interventions/Treatment      Today's Vitals   09/12/24 1326  Weight: 215 lb (97.5 kg)  Height: 5' 6 (1.676 m)   Pain Scale: 0-10 Pain Score: 2  Pain Location: Shoulder Pain Orientation: Left Pain Descriptors / Indicators: Dull, Aching Pain Onset: On-going Patients Stated Pain Goal: 0 Pain Intervention(s): Medication (See eMAR), Hot/Cold interventions Multiple Pain Sites: No  Medications Reviewed Today   Medications were not reviewed in this encounter     Recommendation:   Continue Current Plan of Care  Follow Up Plan:   Telephone follow-up in 1 month  Hendricks Her RN, BSN  [facility]Population [facility] RN Case Information Systems Manager [phone]

## 2024-09-13 ENCOUNTER — Other Ambulatory Visit (INDEPENDENT_AMBULATORY_CARE_PROVIDER_SITE_OTHER)

## 2024-09-13 DIAGNOSIS — N186 End stage renal disease: Secondary | ICD-10-CM

## 2024-09-13 NOTE — Progress Notes (Unsigned)
 "                     MRN : [id number]  Daniel Kline is a 79 y.o. ([birth year]) male who presents with chief complaint of check access.  History of Present Illness:   The patient is seen for evaluation for dialysis access. 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 The patient's most recent creatinine clearance is 14, labs drawn September 06, 2024 BUN 39 and creatinine 3.93. The patient volume status has been a significant issue with severe lymphedema of both lower extremities. Patient's blood pressures been relatively well controlled. There are mild uremic symptoms which appear to be relatively well tolerated at this time.  The patient notes the kidney problem has been present for a long time and has been progressively getting worse.  The patient is followed by nephrology.    The patient is right-handed.  The patient has been considering the various methods of dialysis and wishes to proceed with hemodialysis and therefore creation of AV access is indicated.  No recent shortening of the patient's walking distance or new symptoms consistent with claudication.  No history of rest pain symptoms. No new ulcers or wounds of the lower extremities have occurred.  The patient denies amaurosis fugax or recent TIA symptoms. There are no recent neurological changes noted. There is no history of DVT, PE or superficial thrombophlebitis. No recent episodes of angina or shortness of breath documented.   Active Medications[1]  Past Medical History:  Diagnosis Date   Anemia    H/O   Anemia in chronic kidney disease    Anxiety    Arthritis    Atrial flutter (HCC)    a. 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 03/2022 Echo: EF 60-65%, no rwma, GrI DD, nl RV fxn; b. 02/2024 Echo: EF 60 to 65% with Gr1 DD, nl RV function, and mild MR.   CKD (chronic kidney  disease), stage IV (HCC)    Complication of anesthesia    CTCL (cutaneous T-cell lymphoma) (HCC)    Depression    Diabetes mellitus without complication (HCC)    Dysplastic nevus 12/19/2017   Right distal lat. forearm near wrist. Severe atypia, close to peripheral margin.   Dysplastic nevus 06/21/2018   Upper back right paraspinal. Severe atypia, peripheral margin involved. Excised 07/11/2018, margins free.   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 forearm. SCCis, hypertrophic.   Wears dentures     Past Surgical History:  Procedure Laterality Date   BACK SURGERY     LUMBAR   CARDIAC ELECTROPHYSIOLOGY STUDY AND ABLATION  2019   CATARACT EXTRACTION W/PHACO Right 05/05/2022   Procedure: CATARACT  EXTRACTION PHACO AND INTRAOCULAR LENS PLACEMENT (IOC) RIGHT;  Surgeon: Mittie Gaskin, MD;  Location: [facility];  Service: Ophthalmology;  Laterality: Right;  Diabetic 10.42 01:13.4   CATARACT EXTRACTION W/PHACO Left 05/16/2024   Procedure: PHACOEMULSIFICATION, CATARACT, WITH IOL INSERTION 17.44, 01:37.6;  Surgeon: Mittie Gaskin, MD;  Location: [facility];  Service: Ophthalmology;  Laterality: Left;   COLONOSCOPY WITH PROPOFOL  N/A 04/04/2019   Procedure: COLONOSCOPY WITH PROPOFOL ;  Surgeon: Toledo, Ladell POUR, MD;  Location: [facility];  Service: Gastroenterology;  Laterality: N/A;   ESOPHAGOGASTRODUODENOSCOPY N/A 11/28/2023    Procedure: EGD (ESOPHAGOGASTRODUODENOSCOPY);  Surgeon: Jinny Carmine, MD;  Location: [facility];  Service: Endoscopy;  Laterality: N/A;   I & D EXTREMITY Right 02/01/2020   Procedure: IRRIGATION AND DEBRIDEMENT EXTREMITY with poly exchange;  Surgeon: Mardee Lynwood SQUIBB, MD;  Location: [facility];  Service: Orthopedics;  Laterality: Right;   INCISION AND DRAINAGE     BACK-MRSA INFECTION AFTER BACK SURGERY   KNEE ARTHROPLASTY Right 01/28/2020   Procedure: COMPUTER ASSISTED TOTAL KNEE ARTHROPLASTY;  Surgeon: Mardee Lynwood SQUIBB, MD;  Location: [facility];  Service: Orthopedics;  Laterality: Right;   MOUTH SURGERY     right elbow surgery     right knee surgery     right shoulder surgery     from polio damage   TONSILLECTOMY     TOTAL HIP ARTHROPLASTY Bilateral 04/2016    Social History Social History[2]  Family History Family History  Adopted: Yes    Allergies[3]   REVIEW OF SYSTEMS (Negative unless checked)  Constitutional: [] Weight loss  [] Fever  [] Chills Cardiac: [] Chest pain   [] Chest pressure   [] Palpitations   [] Shortness of breath when laying flat   [] Shortness of breath with exertion. Vascular:  [] Pain in legs with walking   [] Pain in legs at rest  [] History of DVT   [] Phlebitis   [] Swelling in legs   [] Varicose veins   [] Non-healing ulcers Pulmonary:   [] Uses home oxygen   [] Productive cough   [] Hemoptysis   [] Wheeze  [] COPD   [] Asthma Neurologic:  [] Dizziness   [] Seizures   [] History of stroke   [] History of TIA  [] Aphasia   [] Vissual changes   [] Weakness or numbness in arm   [] Weakness or numbness in leg Musculoskeletal:   [] Joint swelling   [] Joint pain   [] Low back pain Hematologic:  [] Easy bruising  [] Easy bleeding   [] Hypercoagulable state   [] Anemic Gastrointestinal:  [] Diarrhea   [] Vomiting  [] Gastroesophageal reflux/heartburn   [] Difficulty swallowing. Genitourinary:  [x] Chronic kidney disease   [] Difficult urination  [] Frequent urination   [] Blood in urine Skin:  [] Rashes    [] Ulcers  Psychological:  [] History of anxiety   []  History of major depression.  Physical Examination  There were no vitals filed for this visit. There is no height or weight on file to calculate BMI. Gen: WD/WN, NAD Head: [place]/AT, No temporalis wasting.  Ear/Nose/Throat: Hearing grossly intact, nares w/o erythema or drainage Eyes: PER, EOMI, sclera nonicteric.  Neck: Supple, no gross masses or lesions.  No JVD.  Pulmonary:  Good air movement, no audible wheezing, no use of accessory muscles.  Cardiac: RRR, precordium non-hyperdynamic. Vascular:   *** Vessel Right Left  Radial Palpable Palpable  Brachial Palpable Palpable  Gastrointestinal: soft, non-distended. No guarding/no peritoneal signs.  Musculoskeletal: M/S 5/5 throughout.  No deformity.  Neurologic: CN 2-12 intact. Pain and light touch intact in extremities.  Symmetrical.  Speech is fluent. Motor exam as listed above. Psychiatric: Judgment intact,  Mood & affect appropriate for pt's clinical situation. Dermatologic: No rashes or ulcers noted.  No changes consistent with cellulitis.   CBC Lab Results  Component Value Date   WBC 3.0 (L) 08/29/2024   HGB 8.9 (L) 08/29/2024   HCT 26.9 (L) 08/29/2024   MCV 96.8 08/29/2024   PLT 100 (L) 08/29/2024    BMET    Component Value Date/Time   NA 138 09/06/2024 1538   NA 141 10/28/2011 0325   K 4.4 09/06/2024 1538   K 3.9 10/28/2011 0325   CL 104 09/06/2024 1538   CL 106 10/28/2011 0325   CO2 26 09/06/2024 1538   CO2 25 10/28/2011 0325   GLUCOSE 107 (H) 09/06/2024 1538   GLUCOSE 121 (H) 10/28/2011 0325   BUN 39 (H) 09/06/2024 1538   BUN 22 (H) 10/28/2011 0325   CREATININE 3.93 (H) 09/06/2024 1538   CREATININE 3.87 (H) 03/06/2024 1342   CREATININE 1.27 (H) 02/09/2016 1421   CALCIUM  9.4 09/06/2024 1538   CALCIUM  7.9 (L) 10/28/2011 0325   GFRNONAA 15 (L) 03/06/2024 1342   GFRNONAA 56 (L) 02/09/2016 1421   GFRAA 43 (L) 02/01/2020 1313   GFRAA 65 02/09/2016 1421    Estimated Creatinine Clearance: 16.4 mL/min (A) (by C-G formula based on SCr of 3.93 mg/dL (H)).  COAG Lab Results  Component Value Date   INR 1.2 11/25/2023   INR 1.3 (H) 09/06/2022   INR 1.2 09/05/2022    Radiology No results found.   Assessment/Plan There are no diagnoses linked to this encounter.   Cordella Shawl, MD  09/13/2024 8:28 AM      [1]  No outpatient medications have been marked as taking for the 09/17/24 encounter (Appointment) with Shawl, Cordella MATSU, MD.  [2]  Social History Tobacco Use   Smoking status: Former    Types: Cigars    Quit date: 09/23/1990    Years since quitting: 33.9    Passive exposure: Past (as a child)   Smokeless tobacco: Former    Quit date: 02/25/2006  Vaping Use   Vaping status: Never Used  Substance Use Topics   Alcohol use: No    Alcohol/week: 0.0 standard drinks of alcohol   Drug use: No  [3]  Allergies Allergen Reactions   Bee Venom Anaphylaxis   Beta Adrenergic Blockers Other (See Comments)    Symptomatic bradycardia   Oxycodone  Other (See Comments)    Delusions   Hydromorphone  Other (See Comments)    hallucinating   Hydroxychloroquine     Other reaction(s): Other (See Comments) He broke out really badly.   Zolpidem Other (See Comments)   "

## 2024-09-14 ENCOUNTER — Inpatient Hospital Stay: Attending: Oncology

## 2024-09-14 DIAGNOSIS — D631 Anemia in chronic kidney disease: Secondary | ICD-10-CM

## 2024-09-14 LAB — CBC WITH DIFFERENTIAL (CANCER CENTER ONLY)
Abs Immature Granulocytes: 0.05 10*3/uL (ref 0.00–0.07)
Basophils Absolute: 0 10*3/uL (ref 0.0–0.1)
Basophils Relative: 1 %
Eosinophils Absolute: 0.1 10*3/uL (ref 0.0–0.5)
Eosinophils Relative: 2 %
HCT: 26.9 % — ABNORMAL LOW (ref 39.0–52.0)
Hemoglobin: 9 g/dL — ABNORMAL LOW (ref 13.0–17.0)
Immature Granulocytes: 1 %
Lymphocytes Relative: 18 %
Lymphs Abs: 0.7 10*3/uL (ref 0.7–4.0)
MCH: 32.7 pg (ref 26.0–34.0)
MCHC: 33.5 g/dL (ref 30.0–36.0)
MCV: 97.8 fL (ref 80.0–100.0)
Monocytes Absolute: 0.5 10*3/uL (ref 0.1–1.0)
Monocytes Relative: 12 %
Neutro Abs: 2.7 10*3/uL (ref 1.7–7.7)
Neutrophils Relative %: 66 %
Platelet Count: 91 10*3/uL — ABNORMAL LOW (ref 150–400)
RBC: 2.75 MIL/uL — ABNORMAL LOW (ref 4.22–5.81)
RDW: 17.6 % — ABNORMAL HIGH (ref 11.5–15.5)
WBC Count: 4 10*3/uL (ref 4.0–10.5)
nRBC: 0 % (ref 0.0–0.2)

## 2024-09-17 ENCOUNTER — Ambulatory Visit (INDEPENDENT_AMBULATORY_CARE_PROVIDER_SITE_OTHER): Admitting: Vascular Surgery

## 2024-09-27 ENCOUNTER — Ambulatory Visit (INDEPENDENT_AMBULATORY_CARE_PROVIDER_SITE_OTHER): Admitting: Vascular Surgery

## 2024-10-02 ENCOUNTER — Ambulatory Visit: Admitting: Oncology

## 2024-10-02 ENCOUNTER — Inpatient Hospital Stay

## 2024-10-02 ENCOUNTER — Other Ambulatory Visit

## 2024-10-04 ENCOUNTER — Ambulatory Visit (INDEPENDENT_AMBULATORY_CARE_PROVIDER_SITE_OTHER): Admitting: Vascular Surgery

## 2024-10-04 ENCOUNTER — Encounter (INDEPENDENT_AMBULATORY_CARE_PROVIDER_SITE_OTHER)

## 2024-10-09 ENCOUNTER — Telehealth

## 2024-10-10 ENCOUNTER — Encounter

## 2024-10-10 ENCOUNTER — Other Ambulatory Visit

## 2024-10-12 ENCOUNTER — Telehealth

## 2024-10-25 ENCOUNTER — Other Ambulatory Visit

## 2024-11-01 ENCOUNTER — Encounter

## 2025-02-11 ENCOUNTER — Ambulatory Visit (INDEPENDENT_AMBULATORY_CARE_PROVIDER_SITE_OTHER): Admitting: Vascular Surgery
